# Patient Record
Sex: Female | Born: 1965 | Race: Black or African American | Hispanic: No | Smoking: Never smoker
Health system: Southern US, Community
[De-identification: ages and names within clinical notes are randomized; demographics above are authoritative.]

## PROBLEM LIST (undated history)

## (undated) DIAGNOSIS — F329 Major depressive disorder, single episode, unspecified: Secondary | ICD-10-CM

## (undated) DIAGNOSIS — T300 Burn of unspecified body region, unspecified degree: Secondary | ICD-10-CM

## (undated) DIAGNOSIS — E785 Hyperlipidemia, unspecified: Secondary | ICD-10-CM

## (undated) DIAGNOSIS — F32A Depression, unspecified: Secondary | ICD-10-CM

## (undated) DIAGNOSIS — R002 Palpitations: Secondary | ICD-10-CM

## (undated) DIAGNOSIS — G4733 Obstructive sleep apnea (adult) (pediatric): Secondary | ICD-10-CM

## (undated) DIAGNOSIS — E119 Type 2 diabetes mellitus without complications: Secondary | ICD-10-CM

## (undated) DIAGNOSIS — D509 Iron deficiency anemia, unspecified: Secondary | ICD-10-CM

## (undated) DIAGNOSIS — Z8742 Personal history of other diseases of the female genital tract: Secondary | ICD-10-CM

## (undated) DIAGNOSIS — I10 Essential (primary) hypertension: Secondary | ICD-10-CM

## (undated) DIAGNOSIS — I5032 Chronic diastolic (congestive) heart failure: Secondary | ICD-10-CM

## (undated) DIAGNOSIS — I2510 Atherosclerotic heart disease of native coronary artery without angina pectoris: Secondary | ICD-10-CM

## (undated) HISTORY — DX: Atherosclerotic heart disease of native coronary artery without angina pectoris: I25.10

## (undated) HISTORY — DX: Chronic diastolic (congestive) heart failure: I50.32

## (undated) HISTORY — DX: Major depressive disorder, single episode, unspecified: F32.9

## (undated) HISTORY — DX: Obstructive sleep apnea (adult) (pediatric): G47.33

## (undated) HISTORY — DX: Hyperlipidemia, unspecified: E78.5

## (undated) HISTORY — DX: Personal history of other diseases of the female genital tract: Z87.42

## (undated) HISTORY — DX: Iron deficiency anemia, unspecified: D50.9

## (undated) HISTORY — DX: Palpitations: R00.2

## (undated) HISTORY — PX: TUBAL LIGATION: SHX77

## (undated) HISTORY — DX: Essential (primary) hypertension: I10

## (undated) HISTORY — DX: Type 2 diabetes mellitus without complications: E11.9

## (undated) HISTORY — DX: Morbid (severe) obesity due to excess calories: E66.01

## (undated) HISTORY — DX: Burn of unspecified body region, unspecified degree: T30.0

## (undated) HISTORY — DX: Depression, unspecified: F32.A

---

## 2008-12-21 HISTORY — PX: OTHER SURGICAL HISTORY: SHX169

## 2008-12-30 ENCOUNTER — Ambulatory Visit: Payer: Self-pay | Admitting: Internal Medicine

## 2008-12-30 ENCOUNTER — Inpatient Hospital Stay (HOSPITAL_COMMUNITY): Admission: EM | Admit: 2008-12-30 | Discharge: 2009-01-16 | Payer: Self-pay | Admitting: Emergency Medicine

## 2009-01-01 ENCOUNTER — Ambulatory Visit: Payer: Self-pay | Admitting: Cardiothoracic Surgery

## 2009-01-02 ENCOUNTER — Encounter: Payer: Self-pay | Admitting: Cardiothoracic Surgery

## 2009-01-02 ENCOUNTER — Ambulatory Visit: Payer: Self-pay | Admitting: Vascular Surgery

## 2009-01-25 ENCOUNTER — Encounter: Payer: Self-pay | Admitting: Cardiology

## 2009-01-25 ENCOUNTER — Ambulatory Visit: Payer: Self-pay | Admitting: Cardiothoracic Surgery

## 2009-01-25 ENCOUNTER — Encounter: Admission: RE | Admit: 2009-01-25 | Discharge: 2009-01-25 | Payer: Self-pay | Admitting: Cardiothoracic Surgery

## 2009-02-08 ENCOUNTER — Ambulatory Visit: Payer: Self-pay | Admitting: Cardiothoracic Surgery

## 2009-03-01 ENCOUNTER — Encounter: Admission: RE | Admit: 2009-03-01 | Discharge: 2009-03-01 | Payer: Self-pay | Admitting: Cardiothoracic Surgery

## 2009-03-01 ENCOUNTER — Ambulatory Visit: Payer: Self-pay | Admitting: Cardiothoracic Surgery

## 2009-03-11 ENCOUNTER — Ambulatory Visit: Payer: Self-pay | Admitting: Cardiothoracic Surgery

## 2009-03-21 ENCOUNTER — Ambulatory Visit: Payer: Self-pay | Admitting: Cardiothoracic Surgery

## 2009-04-15 ENCOUNTER — Ambulatory Visit: Payer: Self-pay | Admitting: Cardiology

## 2009-04-15 ENCOUNTER — Inpatient Hospital Stay (HOSPITAL_COMMUNITY)
Admission: EM | Admit: 2009-04-15 | Discharge: 2009-04-22 | Payer: Self-pay | Source: Home / Self Care | Admitting: Emergency Medicine

## 2009-04-17 ENCOUNTER — Ambulatory Visit: Payer: Self-pay | Admitting: Cardiothoracic Surgery

## 2009-04-22 ENCOUNTER — Encounter (INDEPENDENT_AMBULATORY_CARE_PROVIDER_SITE_OTHER): Payer: Self-pay | Admitting: *Deleted

## 2009-05-01 ENCOUNTER — Telehealth: Payer: Self-pay | Admitting: Cardiology

## 2009-05-07 ENCOUNTER — Ambulatory Visit: Payer: Self-pay | Admitting: Cardiology

## 2009-05-07 DIAGNOSIS — E1159 Type 2 diabetes mellitus with other circulatory complications: Secondary | ICD-10-CM | POA: Insufficient documentation

## 2009-05-07 DIAGNOSIS — I10 Essential (primary) hypertension: Secondary | ICD-10-CM

## 2009-05-07 DIAGNOSIS — I5032 Chronic diastolic (congestive) heart failure: Secondary | ICD-10-CM | POA: Insufficient documentation

## 2009-05-07 DIAGNOSIS — I25810 Atherosclerosis of coronary artery bypass graft(s) without angina pectoris: Secondary | ICD-10-CM | POA: Insufficient documentation

## 2009-05-07 DIAGNOSIS — I2510 Atherosclerotic heart disease of native coronary artery without angina pectoris: Secondary | ICD-10-CM | POA: Insufficient documentation

## 2009-05-07 DIAGNOSIS — E1169 Type 2 diabetes mellitus with other specified complication: Secondary | ICD-10-CM | POA: Insufficient documentation

## 2009-05-07 DIAGNOSIS — E785 Hyperlipidemia, unspecified: Secondary | ICD-10-CM

## 2009-05-09 LAB — CONVERTED CEMR LAB
BUN: 10 mg/dL (ref 6–23)
CO2: 32 meq/L (ref 19–32)
Calcium: 9.1 mg/dL (ref 8.4–10.5)
Chloride: 100 meq/L (ref 96–112)
Creatinine, Ser: 0.6 mg/dL (ref 0.4–1.2)
Glucose, Bld: 241 mg/dL — ABNORMAL HIGH (ref 70–99)
Pro B Natriuretic peptide (BNP): 153 pg/mL — ABNORMAL HIGH (ref 0.0–100.0)

## 2009-05-17 ENCOUNTER — Encounter: Payer: Self-pay | Admitting: Cardiology

## 2009-05-29 ENCOUNTER — Telehealth: Payer: Self-pay | Admitting: Cardiology

## 2009-06-14 ENCOUNTER — Telehealth: Payer: Self-pay | Admitting: Cardiology

## 2009-06-17 ENCOUNTER — Encounter: Payer: Self-pay | Admitting: Cardiology

## 2009-07-10 ENCOUNTER — Encounter: Payer: Self-pay | Admitting: Cardiology

## 2009-07-10 ENCOUNTER — Ambulatory Visit: Payer: Self-pay

## 2009-07-10 ENCOUNTER — Ambulatory Visit (HOSPITAL_COMMUNITY): Admission: RE | Admit: 2009-07-10 | Discharge: 2009-07-10 | Payer: Self-pay | Admitting: Cardiology

## 2009-07-10 ENCOUNTER — Ambulatory Visit: Payer: Self-pay | Admitting: Cardiology

## 2009-07-22 ENCOUNTER — Ambulatory Visit: Payer: Self-pay

## 2009-07-22 ENCOUNTER — Ambulatory Visit: Payer: Self-pay | Admitting: Cardiology

## 2009-07-22 DIAGNOSIS — R002 Palpitations: Secondary | ICD-10-CM

## 2009-07-22 LAB — CONVERTED CEMR LAB
CO2: 32 meq/L (ref 19–32)
Chloride: 100 meq/L (ref 96–112)
Creatinine, Ser: 0.6 mg/dL (ref 0.4–1.2)
Magnesium: 1.8 mg/dL (ref 1.5–2.5)

## 2009-07-26 ENCOUNTER — Telehealth: Payer: Self-pay | Admitting: Cardiology

## 2009-09-09 ENCOUNTER — Encounter: Payer: Self-pay | Admitting: Cardiology

## 2009-10-02 ENCOUNTER — Encounter: Payer: Self-pay | Admitting: Cardiology

## 2009-10-28 ENCOUNTER — Ambulatory Visit: Payer: Self-pay | Admitting: Cardiology

## 2010-01-20 ENCOUNTER — Encounter: Payer: Self-pay | Admitting: Cardiology

## 2010-01-24 ENCOUNTER — Encounter: Payer: Self-pay | Admitting: Cardiology

## 2010-01-24 DIAGNOSIS — I259 Chronic ischemic heart disease, unspecified: Secondary | ICD-10-CM | POA: Insufficient documentation

## 2010-02-04 ENCOUNTER — Ambulatory Visit: Payer: Self-pay | Admitting: Cardiology

## 2010-02-04 ENCOUNTER — Encounter: Payer: Self-pay | Admitting: Cardiology

## 2010-02-04 DIAGNOSIS — R079 Chest pain, unspecified: Secondary | ICD-10-CM

## 2010-02-11 ENCOUNTER — Telehealth (INDEPENDENT_AMBULATORY_CARE_PROVIDER_SITE_OTHER): Payer: Self-pay | Admitting: Radiology

## 2010-02-12 ENCOUNTER — Ambulatory Visit: Payer: Self-pay

## 2010-02-12 ENCOUNTER — Ambulatory Visit: Payer: Self-pay | Admitting: Internal Medicine

## 2010-02-12 ENCOUNTER — Encounter: Payer: Self-pay | Admitting: Internal Medicine

## 2010-02-12 ENCOUNTER — Encounter (HOSPITAL_COMMUNITY)
Admission: RE | Admit: 2010-02-12 | Discharge: 2010-04-22 | Payer: Self-pay | Source: Home / Self Care | Attending: Cardiology | Admitting: Cardiology

## 2010-02-12 LAB — CONVERTED CEMR LAB
ALT: 27 units/L (ref 0–35)
AST: 26 units/L (ref 0–37)
Albumin: 3.9 g/dL (ref 3.5–5.2)
HDL: 28.5 mg/dL — ABNORMAL LOW (ref >39.00)
Total CK: 314 units/L — ABNORMAL HIGH (ref 7–177)
Total Protein: 6.9 g/dL (ref 6.0–8.3)
Triglycerides: 177 mg/dL — ABNORMAL HIGH (ref 0.0–149.0)
VLDL: 35.4 mg/dL (ref 0.0–40.0)

## 2010-02-17 ENCOUNTER — Ambulatory Visit: Payer: Self-pay

## 2010-02-24 ENCOUNTER — Encounter: Payer: Self-pay | Admitting: Cardiology

## 2010-02-24 ENCOUNTER — Ambulatory Visit: Payer: Self-pay | Admitting: Cardiology

## 2010-02-25 LAB — CONVERTED CEMR LAB
Basophils Relative: 0.3 % (ref 0.0–3.0)
Calcium: 9.4 mg/dL (ref 8.4–10.5)
Creatinine, Ser: 0.7 mg/dL (ref 0.4–1.2)
Eosinophils Relative: 0.7 % (ref 0.0–5.0)
GFR calc non Af Amer: 118.1 mL/min (ref >60.00)
Glucose, Bld: 278 mg/dL — ABNORMAL HIGH (ref 70–99)
Hemoglobin: 12.5 g/dL (ref 12.0–15.0)
Lymphocytes Relative: 31.3 % (ref 12.0–46.0)
MCHC: 33.5 g/dL (ref 30.0–36.0)
Monocytes Relative: 7.3 % (ref 3.0–12.0)
Neutro Abs: 4.7 10*3/uL (ref 1.4–7.7)
RDW: 14.3 % (ref 11.5–14.6)
Sodium: 140 meq/L (ref 135–145)

## 2010-02-27 ENCOUNTER — Telehealth: Payer: Self-pay | Admitting: Cardiology

## 2010-02-28 ENCOUNTER — Ambulatory Visit
Admission: RE | Admit: 2010-02-28 | Discharge: 2010-02-28 | Payer: Self-pay | Source: Home / Self Care | Attending: Cardiology | Admitting: Cardiology

## 2010-02-28 ENCOUNTER — Inpatient Hospital Stay (HOSPITAL_COMMUNITY)
Admission: AD | Admit: 2010-02-28 | Discharge: 2010-03-06 | Payer: Self-pay | Source: Home / Self Care | Attending: Cardiology | Admitting: Cardiology

## 2010-03-13 ENCOUNTER — Encounter: Payer: Self-pay | Admitting: Cardiology

## 2010-03-20 ENCOUNTER — Encounter: Payer: Self-pay | Admitting: Cardiology

## 2010-03-20 ENCOUNTER — Ambulatory Visit: Payer: Self-pay | Admitting: Cardiology

## 2010-03-26 ENCOUNTER — Encounter: Payer: Self-pay | Admitting: Cardiology

## 2010-03-31 ENCOUNTER — Encounter: Payer: Self-pay | Admitting: Cardiology

## 2010-04-02 ENCOUNTER — Ambulatory Visit: Admit: 2010-04-02 | Payer: Self-pay | Admitting: Cardiology

## 2010-04-11 ENCOUNTER — Encounter: Payer: Self-pay | Admitting: Cardiology

## 2010-04-18 ENCOUNTER — Encounter (INDEPENDENT_AMBULATORY_CARE_PROVIDER_SITE_OTHER): Payer: Self-pay | Admitting: *Deleted
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
## 2010-04-22 NOTE — Letter (Signed)
Summary: Generic Letter  Press photographer, [place]  [address] [place]   [place], [place] [address]   Phone: [phone]  Fax: [phone]        October 28, 2009 MRN: [id number]    [facility] [place], [place]  [address] DOB [birth year]  Fasting lipid and liver profile in 2 months  414.05   428.32 Please fax results to Dr Aundra Dubin Fax [phone]         [name]  This letter has been electronically signed by your physician.

## 2010-04-22 NOTE — Miscellaneous (Signed)
Summary: [facility] Progress Report   Imported By: Sallee Provencal 11/08/2009 10:58:10  _____________________________________________________________________  External Attachment:    Type:   Image     Comment:   External Document

## 2010-04-22 NOTE — Miscellaneous (Signed)
Summary: [facility] Cardiac Session [place] Cardiac Session 11   Imported By: Sallee Provencal 09/17/2009 15:27:24  _____________________________________________________________________  External Attachment:    Type:   Image     Comment:   External Document

## 2010-04-22 NOTE — Letter (Signed)
Summary: Generic Letter  Press photographer, [place]  [address] [place]   [place], [place] [address]   Phone: [phone]  Fax: [phone]        October 28, 2009 MRN: [id number]    [facility] [place], [place]  [address] DOB 1965-12-26   CK   414.05  428.32 In 1 month-please fax results to Dr Aundra Dubin Fax [phone]        Theador Hawthorne  This letter has been electronically signed by your physician.

## 2010-04-22 NOTE — Progress Notes (Signed)
Summary: surgical clearance  Phone Note From Other Clinic   Summary of Call: Per CIndy pt needs clearance for surgery. Procedure for tomorrow.[phone] fax [phone] Initial call taken by: Lorraine Lax,  May 01, 2009 12:33 PM  Follow-up for Phone Call        [facility] in [facility] with [place]- pt scheduled for intrauterine ablation 05-02-09 --discussed with Dr Aundra Dubin and faxed copy of D/C summary from 04-22-09 --Cindy aware pt should remain on effient,aspirin, and coreg for the procedure. Eliot Ford

## 2010-04-22 NOTE — Letter (Signed)
Summary: Cardiac Catheterization Instructions- [place], [place]  [id number] [place]   [place], [place] [address]   Phone: [phone]  Fax: [phone]     02/24/2010 MRN: [id number]  [facility] [address], [place]  [address]  Dear [name],   You are scheduled for a Cardiac Catheterization on Friday February 28, 2010 with Dr. Loralie Champagne.  Please arrive to the 1st floor of the Heart and Vascular Center at [facility] at 9:30 am on the day of your procedure. Please do not arrive before 6:30 a.m. Call the Heart and Vascular Center at [phone] if you are unable to make your appointmnet. The Code to get into the parking garage under the building is 0003. Take the elevators to the 1st floor. You must have someone to drive you home. Someone must be with you for the first 24 hours after you arrive home. Please wear clothes that are easy to get on and off and wear slip-on shoes. Do not eat or drink after midnight except water with your medications that morning. Bring all your medications and current insurance cards with you.  _x__ DO NOT take these medications before your procedure:  DO NOT TAKE GLUCOPHAGE STARTING THURSDAY NIGHT AND FOR 48 HOURS.  ADJUST YOUR INSULIN DOSE BASED ON YOU BLOOD SUGAR.    _x__ Make sure you take your aspirin.  __x_ You may take ALL of your other  medications with water that morning.    The usual length of stay after your procedure is 2 to 3 hours. This can vary.  If you have any questions, please call the office at the number listed above.   Desiree Lucy, RN, BSN
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
## 2010-04-22 NOTE — Miscellaneous (Signed)
Summary: [facility] Discharge Notification   [facility] Discharge Notification   Imported By: Sallee Provencal 02/05/2010 11:20:29  _____________________________________________________________________  External Attachment:    Type:   Image     Comment:   External Document
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
## 2010-04-22 NOTE — Progress Notes (Signed)
Summary: question on meds  Phone Note Call from Patient Call back at [place]: Patient Reason for Call: Talk to Nurse Summary of Call: pt has question on meds Initial call taken by: Regan Lemming,  February 27, 2010 4:25 PM  Follow-up for Phone Call        pt. wondering if she should take her insulin tonight since she has a cath in the morning. I explained to her that she should check her blood sugar tonight and dose her insulin accordingly. They should check her CBG on arrival to the [facility] lab as well. She is going to take her glucophage tonight around 6-7pm but will not take anymore oral diabetic medications after this. She is aware not to eat/drink after midnight.  Whitney Jannett Celestine RN  February 27, 2010 5:06 PM  Follow-up by: Whitney Jannett Celestine RN,  February 27, 2010 5:06 PM

## 2010-04-22 NOTE — Procedures (Signed)
Summary: summary report  summary report   Imported By: Parks Neptune 07/31/2009 11:31:33  _____________________________________________________________________  External Attachment:    Type:   Image     Comment:   External Document

## 2010-04-22 NOTE — Progress Notes (Signed)
Summary: nuc pre-procedure  Phone Note Outgoing Call   Call placed by: Charlton Amor, CNMT,  February 11, 2010 2:14 PM Call placed to: Patient Reason for Call: Confirm/change Appt Summary of Call: Reviewed information on Myoview Information Sheet (see scanned document for further details).  Spoke with patient.  Initial call taken by: Charlton Amor, CNMT,  February 11, 2010 2:14 PM     Nuclear Med Background Indications for Stress Test: Evaluation for Ischemia, Graft Patency   History: CABG, Echo, Heart Catheterization, Myocardial Infarction, Stents  History Comments: 10/10 CABG x 3, 1/11 MI / Cath /stents - svg>pda, svg>lad; mild osa; Chronic diastolic HF  Symptoms: Chest Pain, Chest Tightness, DOE, Palpitations    Nuclear Pre-Procedure Cardiac Risk Factors: Hypertension, IDDM Type 2, Lipids, Obesity Height (in): 67

## 2010-04-22 NOTE — Assessment & Plan Note (Signed)
Summary: Cardiology Nuclear Testing  Nuclear Med Background Indications for Stress Test: Evaluation for Ischemia, Graft Patency   History: CABG, Echo, Heart Catheterization, Myocardial Infarction, Stents  History Comments: 10/10 CABG x 3, 1/11 MI>Stent-SVG>PDA, SVG>LAD  Symptoms: Chest Pain, Chest Pain with Exertion, Chest Tightness, Chest Tightness with Exertion, DOE, Fatigue, Nausea, Palpitations  Symptoms Comments: Last episode of [id number] night.   Nuclear Pre-Procedure Cardiac Risk Factors: Family History - CAD, Hypertension, IDDM Type 2, Lipids, Obesity Caffeine/Decaff Intake: none NPO After: 7:30 AM Lungs: Clear.  O2 Sat 97% on RA. IV 0.9% NS with Angio Cath: 22g     IV Site: R Hand IV Started by: Annye Rusk, CNMT Chest Size (in) 48     Cup Size C     Height (in): 67 Weight (lb): 329 BMI: 51.71 Tech Comments: Carvedilol held this a.m.  Nuclear Med Study 1 or 2 day study:  2 day     Stress Test Type:  Carlton Adam Reading MD:  Dorris Carnes, MD     Referring MD:  Loralie Champagne, MD Resting Radionuclide:  Technetium 63m Tetrofosmin     Resting Radionuclide Dose:  33.0 mCi  Stress Radionuclide:  Technetium 89m Tetrofosmin     Stress Radionuclide Dose:  33.0 mCi   Stress Protocol   Lexiscan: 0.4 mg   Stress Test Technologist:  Valetta Fuller, CMA-N     Nuclear Technologist:  Charlton Amor, CNMT  Rest Procedure  Myocardial perfusion imaging was performed at rest 45 minutes following the intravenous administration of Technetium 15m Tetrofosmin.  Stress Procedure  The patient received IV Lexiscan 0.4 mg over 15-seconds.  Technetium 27m Tetrofosmin injected at 30-seconds.  There were no significant changes with infusion, other than a rare PVC.  Quantitative spect images were obtained after a 45 minute delay.  QPS Raw Data Images:  Soft tissue (diaphragm, bowel activity, breast tissue) surround heart. Stress Images:  Defect in the inferor wall (base, mid); inferolateral wall  (base, mid).  Note extensive soft tissue scatter. Rest Images:  Improvement, normalization in the inferior/inferolateral wall. Transient Ischemic Dilatation:  .70  (Normal <1.22)  Lung/Heart Ratio:  .32  (Normal <0.45)  Quantitative Gated Spect Images QGS EDV:  87 ml QGS ESV:  36 ml QGS EF:  58 %   Overall Impression  Exercise Capacity: Lexiscan with no exercise. BP Response: Normal blood pressure response. Clinical Symptoms: No chest pain ECG Impression: No significant ST segment change suggestive of ischemia. Overall Impression Comments: Ischemia in the inferior, inferolateral wall (base, mid).  Appended Document: Cardiology Nuclear Testing inferior/inferolateral ischemia.  If symptoms are stable, will need to see her back in office to discuss cath versus medical management.   Appended Document: Cardiology Nuclear Testing 02/18/10--1130am--unable to reach pt by phone--will try pt later today--nt  Appended Document: Cardiology Nuclear Testing 02/18/10--1630pm--still unable to reach pt by phone--nt  Appended Document: Cardiology Nuclear Testing would try to reach her again Wednesday.  Appended Document: Cardiology Nuclear Testing [facility]  Appended Document: Cardiology Nuclear Testing I talked with pt by telephone--she states he symptoms are mostly unchanged, although she does have some days that her energy level is worse --she states she had chest pain last night that was sharp and did not last very long--she did not have to take NTG for relief--she has not had to use NTG since her OV with Dr Aundra Dubin 02/04/10--I offered pt an appt with Dr Aundra Dubin 02/20/10 -she states her husband has an appt in [place] tomorrow--she wants to think about what  she wants to do  and call me back today  Appended Document: Cardiology Nuclear Testing [facility]   Appended Document: Cardiology Nuclear Testing I talked with pt-she is aware of appt with Dr Aundra Dubin 02/24/10 at 12:30--

## 2010-04-22 NOTE — Consult Note (Signed)
Summary: [place] Referral Form  [facility] Rehab Referral Form   Imported By: Sallee Provencal 06/26/2009 16:16:22  _____________________________________________________________________  External Attachment:    Type:   Image     Comment:   External Document

## 2010-04-22 NOTE — Progress Notes (Signed)
Summary: monitor results 07/22/09-07/25/09  Phone Note Outgoing Call   Call placed by: Desiree Lucy, RN, BSN,  Jul 26, 2009 8:46 AM Call placed to: Patient  Follow-up for Phone Call        I am trying to  contact pt by telephone to discuss monitor results 07/22/09-07/25/09(on Dr Claris Gladden cart)--LMVM Desiree Lucy, RN, BSN  Jul 26, 2009 8:48 AM unable to leave message on voice mail Desiree Lucy, RN, BSN  Jul 26, 2009 12:20 PM  LM for pt to call Desiree Lucy, RN, BSN  Jul 26, 2009 3:04 PM  Pt returned call Delsa Sale  Jul 26, 2009 3:49 PM  Additional Follow-up for Phone Call Additional follow up Details #1::        returning call, please call back [phone] discussed monitor results with pt by telephone

## 2010-04-22 NOTE — Letter (Signed)
Summary: Appointment - [place], [place]  [address] [place]   [place], [place] [address]   Phone: [phone]  Fax: [phone]     April 22, 2009 MRN: [id number]   [facility] [address] [place], [place]  [address]   Dear [name],   Due to a change in our office schedule, your appointment on 05/02/2009 at 8:30am                has been rescheduled to 05/07/2009 @10 :30am. We look forward to participating in your health care needs. Please contact us at the number listed above at your earliest convenience to reschedule this appointment if you are not available then.     Sincerely,  Public relations account executive

## 2010-04-22 NOTE — Progress Notes (Signed)
Summary: appt/ lab work  Phone Note Call from Patient Call back at [phone]   Reason for Call: Talk to Nurse, Lab or Test Results Summary of Call: has appt tomorrow, husband possible having surgery may need to cancel, would like results of lab work Initial call taken by: Darnell Level,  May 29, 2009 10:56 AM  Follow-up for Phone Call        Called patient back with results of bmet done at the end of Feb. in [place]...advised her to stay on the same dose of KCL. She will call back if she needs to reschedule appointment with Dr.Loyalty Brashier for tomorrow. Follow-up by: Georgetta Haber RN

## 2010-04-22 NOTE — Progress Notes (Signed)
Summary: start cardiac rehab  Phone Note Call from Patient Call back at Home Phone [phone] Call back at [phone]   Caller: Patient Reason for Call: Talk to Nurse Summary of Call: pt wants to start cardiac rehab in [place], pt doesn't have the phone #.  Initial call taken by: Neil Crouch,  June 14, 2009 3:28 PM  Follow-up for Phone Call        Left message for pt to call back.  I left on the message that our office needs further information on where this pt would like to attend cardiac rehab before starting referral. Theodosia Quay, RN, BSN  June 14, 2009 3:56 PM     Appended Document: start cardiac rehab Needs to start rehab at the hospital in [facility].  Please help her set this up.   Appended Document: start cardiac rehab faxed order for cardiac rehab to [facility] [phone]-pt aware  Appended Document: start cardiac rehab [place] fax  [phone]

## 2010-04-24 LAB — CONVERTED CEMR LAB
BUN: 16 mg/dL (ref 6–23)
CO2: 28 meq/L (ref 19–32)
Calcium: 9.1 mg/dL (ref 8.4–10.5)
Creatinine, Ser: 0.6 mg/dL (ref 0.4–1.2)
Glucose, Bld: 190 mg/dL — ABNORMAL HIGH (ref 70–99)

## 2010-04-24 NOTE — Letter (Signed)
Summary: Appointment - [place], [place]  [address] [place]   [place], [place] [address]   Phone: [phone]  Fax: [phone]          April 18, 2010 MRN: [id number]      [facility] [place], [place]  [address]     Dear Ms. Hoffmaster,   Please disregard previous letter. Your appointment on June 05, 2010 at 10:30am with Dr Aundra Dubin is not canceled. It is very important we reach you to confirm this appointment.   We look forward to participating in your health care needs. Please contact us at the number listed above at your earliest convenience to reschedule this appointment.     Sincerely,  [place] Scheduling Team

## 2010-04-24 NOTE — Miscellaneous (Signed)
Clinical Lists Changes  Observations: Added new observation of CARDCATHFIND: IMPRESSION: 1. Severe three-vessel coronary artery disease. 2. Occluded saphenous vein graft to the right coronary artery with     subtotal occlusion of the native right coronary artery, now status     post placement of three drug-eluting stents. 3. Severe stenosis in the saphenous vein graft to the mid left     anterior descending, now status post drug-eluting stent x1 in the     proximal body of the saphenous vein graft.   RECOMMENDATIONS:  I recommend aggressive risk factor modification as well as continuation of dual-antiplatelet therapy with aspirin and Effient for at least 1 year.  I would recommend that this patient stay on Effient for the remainder of her lifetime if she tolerates it.  We will also continue her beta-blocker, ACE inhibitor, Imdur, and her statin.       (03/05/2010 13:44) Added new observation of ECHOINTERP: - Left ventricle: The cavity size was normal. Wall thickness was     increased in a pattern of moderate to severe LVH. Systolic     function was normal. The estimated ejection fraction was 55%.     Features are consistent with a pseudonormal left ventricular     filling pattern, with concomitant abnormal relaxation and     increased filling pressure (grade 2 diastolic dysfunction).   - Ventricular septum: Septal motion showed paradox. These changes     are consistent with a post-thoracotomy state.   - Mitral valve: Calcified annulus.   - Left atrium: The atrium was moderately dilated.   - Impressions: Technically limited study due to poor sound wave     transmission.   Impressions:    - Technically limited study due to poor sound wave transmission.  (03/01/2010 13:45) Added new observation of NUCLEAR NOS: Exercise Capacity: Lexiscan with no exercise. BP Response: Normal blood pressure response. Clinical Symptoms: No chest pain ECG Impression: No significant ST segment  change suggestive of ischemia. Overall Impression Comments: Ischemia in the inferior, inferolateral wall (base, mid).  (02/12/2010 13:44)      Cardiac Cath  Procedure date:  03/05/2010  Findings:      IMPRESSION: 1. Severe three-vessel coronary artery disease. 2. Occluded saphenous vein graft to the right coronary artery with     subtotal occlusion of the native right coronary artery, now status     post placement of three drug-eluting stents. 3. Severe stenosis in the saphenous vein graft to the mid left     anterior descending, now status post drug-eluting stent x1 in the     proximal body of the saphenous vein graft.   RECOMMENDATIONS:  I recommend aggressive risk factor modification as well as continuation of dual-antiplatelet therapy with aspirin and Effient for at least 1 year.  I would recommend that this patient stay on Effient for the remainder of her lifetime if she tolerates it.  We will also continue her beta-blocker, ACE inhibitor, Imdur, and her statin.        Nuclear Study  Procedure date:  02/12/2010  Findings:      Exercise Capacity: Lexiscan with no exercise. BP Response: Normal blood pressure response. Clinical Symptoms: No chest pain ECG Impression: No significant ST segment change suggestive of ischemia. Overall Impression Comments: Ischemia in the inferior, inferolateral wall (base, mid).   Echocardiogram  Procedure date:  03/01/2010  Findings:      - Left ventricle: The cavity size was normal. Wall thickness was  increased in a pattern of moderate to severe LVH. Systolic     function was normal. The estimated ejection fraction was 55%.     Features are consistent with a pseudonormal left ventricular     filling pattern, with concomitant abnormal relaxation and     increased filling pressure (grade 2 diastolic dysfunction).   - Ventricular septum: Septal motion showed paradox. These changes     are consistent with a post-thoracotomy  state.   - Mitral valve: Calcified annulus.   - Left atrium: The atrium was moderately dilated.   - Impressions: Technically limited study due to poor sound wave     transmission.   Impressions:    - Technically limited study due to poor sound wave transmission.
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
## 2010-04-30 NOTE — Consult Note (Signed)
Summary: [facility] Cardiac Rehab Referral Form   [facility] Cardiac Rehab Referral Form   Imported By: Sallee Provencal 04/22/2010 15:16:37  _____________________________________________________________________  External Attachment:    Type:   Image     Comment:   External Document

## 2010-05-09 ENCOUNTER — Telehealth: Payer: Self-pay | Admitting: Cardiology

## 2010-05-14 NOTE — Progress Notes (Signed)
Summary: sample of crestor  Phone Note Call from Patient Call back at Home Phone [phone]   Caller: Patient Reason for Call: Talk to Nurse Summary of Call: per pt calling, sample of 5 mg crestor.  Initial call taken by: Neil Crouch,  May 09, 2010 11:37 AM  Follow-up for Phone Call        I talked with pt--I left samples of Crestor 5mg  #28 at desk for pt to pick up--pt states she had cholesterol checked in January in [place] and her LDL was good--she thinks 62!!!-she will ask them to fax a copy to Dr Aundra Dubin

## 2010-05-16 NOTE — H&P (Signed)
  [name], [name]              ACCOUNT NO.:  [id number]  MEDICAL RECORD NO.:  [id number]          PATIENT TYPE:  INP  LOCATION:  2008                         FACILITY:  [facility]  PHYSICIAN:  Loralie Champagne, MD      DATE OF BIRTH:  08-20-1965  DATE OF ADMISSION:  02/28/2010 DATE OF DISCHARGE:  03/06/2010                             HISTORY & PHYSICAL   This patient was admitted to the Outpatient Cath Lab for a cardiac catheterization.  There has been no change in her history and physical since the prior visit.     Loralie Champagne, MD     DM/MEDQ  D:  05/01/2010  T:  05/01/2010  Job:  [id number]  Electronically Signed by Loralie Champagne MD on 05/16/2010 10:33:45 AM

## 2010-05-19 ENCOUNTER — Encounter: Payer: Self-pay | Admitting: Cardiology

## 2010-05-29 ENCOUNTER — Encounter (INDEPENDENT_AMBULATORY_CARE_PROVIDER_SITE_OTHER): Payer: Self-pay | Admitting: *Deleted

## 2010-05-29 NOTE — Letter (Signed)
Summary: [facility]: Physician Referral [facility]: Physician Referral Form   Imported By: Roddie Mc 05/19/2010 17:37:45  _____________________________________________________________________  External Attachment:    Type:   Image     Comment:   External Document

## 2010-05-29 NOTE — Miscellaneous (Signed)
Summary: [facility] Assessment/Treatment Plan   Imported By: Sallee Provencal 05/14/2010 11:39:55  _____________________________________________________________________  External Attachment:    Type:   Image     Comment:   External Document

## 2010-05-29 NOTE — Miscellaneous (Signed)
Summary: Cardiac Rehab Program  Cardiac Rehab Program   Imported By: Roddie Mc 05/16/2010 09:13:11  _____________________________________________________________________  External Attachment:    Type:   Image     Comment:   External Document

## 2010-05-29 NOTE — Letter (Signed)
Summary: [facility]: Fax [facility]: Fax Cover Sheet   Imported By: Roddie Mc 05/22/2010 15:50:01  _____________________________________________________________________  External Attachment:    Type:   Image     Comment:   External Document

## 2010-06-02 LAB — CBC
HCT: 36.5 % (ref 36.0–46.0)
MCH: 26.3 pg (ref 26.0–34.0)
MCHC: 32.9 g/dL (ref 30.0–36.0)
MCV: 79.9 fL (ref 78.0–100.0)
WBC: 6 10*3/uL (ref 4.0–10.5)

## 2010-06-02 LAB — BASIC METABOLIC PANEL
BUN: 8 mg/dL (ref 6–23)
CO2: 29 mEq/L (ref 19–32)
Calcium: 9 mg/dL (ref 8.4–10.5)
Chloride: 99 mEq/L (ref 96–112)
GFR calc non Af Amer: >60 mL/min (ref >60)
Potassium: 3.5 mEq/L (ref 3.5–5.1)
Sodium: 135 mEq/L (ref 135–145)

## 2010-06-03 LAB — BASIC METABOLIC PANEL
CO2: 27 mEq/L (ref 19–32)
CO2: 30 mEq/L (ref 19–32)
Calcium: 8.6 mg/dL (ref 8.4–10.5)
Calcium: 9 mg/dL (ref 8.4–10.5)
Calcium: 9.4 mg/dL (ref 8.4–10.5)
Chloride: 101 mEq/L (ref 96–112)
Creatinine, Ser: 0.62 mg/dL (ref 0.4–1.2)
GFR calc Af Amer: >60 mL/min (ref >60)
GFR calc Af Amer: >60 mL/min (ref >60)
GFR calc Af Amer: >60 mL/min (ref >60)
GFR calc Af Amer: >60 mL/min (ref >60)
GFR calc non Af Amer: >60 mL/min (ref >60)
GFR calc non Af Amer: >60 mL/min (ref >60)
GFR calc non Af Amer: >60 mL/min (ref >60)
Glucose, Bld: 216 mg/dL — ABNORMAL HIGH (ref 70–99)
Glucose, Bld: 231 mg/dL — ABNORMAL HIGH (ref 70–99)
Glucose, Bld: 241 mg/dL — ABNORMAL HIGH (ref 70–99)
Potassium: 3.6 mEq/L (ref 3.5–5.1)
Sodium: 137 mEq/L (ref 135–145)
Sodium: 139 mEq/L (ref 135–145)
Sodium: 140 mEq/L (ref 135–145)

## 2010-06-03 LAB — CBC
HCT: 33.8 % — ABNORMAL LOW (ref 36.0–46.0)
HCT: 35.6 % — ABNORMAL LOW (ref 36.0–46.0)
HCT: 36 % (ref 36.0–46.0)
Hemoglobin: 11 g/dL — ABNORMAL LOW (ref 12.0–15.0)
Hemoglobin: 11.8 g/dL — ABNORMAL LOW (ref 12.0–15.0)
MCH: 26.4 pg (ref 26.0–34.0)
MCH: 26.5 pg (ref 26.0–34.0)
MCHC: 31.9 g/dL (ref 30.0–36.0)
MCHC: 32.5 g/dL (ref 30.0–36.0)
MCHC: 33.1 g/dL (ref 30.0–36.0)
MCHC: 33.2 g/dL (ref 30.0–36.0)
MCV: 79.6 fL (ref 78.0–100.0)
Platelets: 221 10*3/uL (ref 150–400)
RBC: 4.45 MIL/uL (ref 3.87–5.11)
RBC: 4.47 MIL/uL (ref 3.87–5.11)
RDW: 13.4 % (ref 11.5–15.5)
RDW: 13.6 % (ref 11.5–15.5)
WBC: 6.6 10*3/uL (ref 4.0–10.5)

## 2010-06-03 LAB — GLUCOSE, CAPILLARY
Glucose-Capillary: 216 mg/dL — ABNORMAL HIGH (ref 70–99)
Glucose-Capillary: 220 mg/dL — ABNORMAL HIGH (ref 70–99)
Glucose-Capillary: 246 mg/dL — ABNORMAL HIGH (ref 70–99)
Glucose-Capillary: 246 mg/dL — ABNORMAL HIGH (ref 70–99)
Glucose-Capillary: 247 mg/dL — ABNORMAL HIGH (ref 70–99)
Glucose-Capillary: 250 mg/dL — ABNORMAL HIGH (ref 70–99)
Glucose-Capillary: 259 mg/dL — ABNORMAL HIGH (ref 70–99)
Glucose-Capillary: 267 mg/dL — ABNORMAL HIGH (ref 70–99)
Glucose-Capillary: 280 mg/dL — ABNORMAL HIGH (ref 70–99)
Glucose-Capillary: 284 mg/dL — ABNORMAL HIGH (ref 70–99)
Glucose-Capillary: 287 mg/dL — ABNORMAL HIGH (ref 70–99)
Glucose-Capillary: 292 mg/dL — ABNORMAL HIGH (ref 70–99)

## 2010-06-03 LAB — COMPREHENSIVE METABOLIC PANEL
ALT: 26 U/L (ref 0–35)
AST: 24 U/L (ref 0–37)
CO2: 30 mEq/L (ref 19–32)
Calcium: 8.8 mg/dL (ref 8.4–10.5)
Chloride: 100 mEq/L (ref 96–112)
Creatinine, Ser: 0.64 mg/dL (ref 0.4–1.2)
GFR calc Af Amer: >60 mL/min (ref >60)
GFR calc non Af Amer: >60 mL/min (ref >60)
Glucose, Bld: 298 mg/dL — ABNORMAL HIGH (ref 70–99)
Sodium: 135 mEq/L (ref 135–145)
Total Bilirubin: 0.5 mg/dL (ref 0.3–1.2)

## 2010-06-03 LAB — PLATELET COUNT: Platelets: 212 10*3/uL (ref 150–400)

## 2010-06-03 LAB — LIPID PANEL
Cholesterol: 132 mg/dL (ref 0–200)
HDL: 30 mg/dL — ABNORMAL LOW (ref >39)
Total CHOL/HDL Ratio: 4.4 RATIO
VLDL: 37 mg/dL (ref 0–40)

## 2010-06-03 LAB — PLATELET INHIBITION P2Y12
P2Y12 % Inhibition: 51 %
Platelet Function  P2Y12: 168 [PRU] — ABNORMAL LOW (ref 194–418)

## 2010-06-03 LAB — TSH: TSH: 0.887 u[IU]/mL (ref 0.350–4.500)

## 2010-06-03 LAB — PROTIME-INR: Prothrombin Time: 13.7 seconds (ref 11.6–15.2)

## 2010-06-03 NOTE — Letter (Signed)
Summary: Appointment - [place] [place]  [address] [place]   [place], [place] [address]   Phone: [phone]  Fax: [phone]         May 29, 2010 MRN: [id number]      [facility] [place], [place]  [address]     Dear Ms. Vorhees,   Due to a change in our office schedule, your appointment on June 05, 2010 at 10:30am with Dr. Aundra Dubin must be changed.  It is very important that we reach you to reschedule this appointment. We look forward to participating in your health care needs. Please contact us at the number listed above at your earliest convenience to reschedule this appointment.     Sincerely,  [facility] Scheduling Team

## 2010-06-05 ENCOUNTER — Ambulatory Visit: Payer: Self-pay | Admitting: Cardiology

## 2010-06-06 NOTE — H&P (Signed)
  NAMESHALANE, GREEN              ACCOUNT NO.:  [id number]  MEDICAL RECORD NO.:  [id number]           PATIENT TYPE:  LOCATION:                                 FACILITY:  PHYSICIAN:  Loralie Champagne, MD      DATE OF BIRTH:  1965/03/28  DATE OF ADMISSION:  02/28/2010 DATE OF DISCHARGE:                             HISTORY & PHYSICAL   The patient was admitted for cardiac catheterization.  History and physical as the same as previously dictated office note.     Loralie Champagne, MD     DM/MEDQ  D:  05/29/2010  T:  05/29/2010  Job:  [id number]  Electronically Signed by Loralie Champagne MD on 06/06/2010 09:32:10 PM

## 2010-06-08 LAB — CBC
HCT: 24.8 % — ABNORMAL LOW (ref 36.0–46.0)
HCT: 25.3 % — ABNORMAL LOW (ref 36.0–46.0)
HCT: 26.1 % — ABNORMAL LOW (ref 36.0–46.0)
HCT: 26.3 % — ABNORMAL LOW (ref 36.0–46.0)
HCT: 26.7 % — ABNORMAL LOW (ref 36.0–46.0)
HCT: 27.8 % — ABNORMAL LOW (ref 36.0–46.0)
Hemoglobin: 7.6 g/dL — ABNORMAL LOW (ref 12.0–15.0)
Hemoglobin: 7.6 g/dL — ABNORMAL LOW (ref 12.0–15.0)
Hemoglobin: 7.8 g/dL — ABNORMAL LOW (ref 12.0–15.0)
Hemoglobin: 8 g/dL — ABNORMAL LOW (ref 12.0–15.0)
Hemoglobin: 8.1 g/dL — ABNORMAL LOW (ref 12.0–15.0)
Hemoglobin: 8.2 g/dL — ABNORMAL LOW (ref 12.0–15.0)
Hemoglobin: 8.2 g/dL — ABNORMAL LOW (ref 12.0–15.0)
Hemoglobin: 8.3 g/dL — ABNORMAL LOW (ref 12.0–15.0)
MCHC: 30.3 g/dL (ref 30.0–36.0)
MCHC: 30.4 g/dL (ref 30.0–36.0)
MCHC: 31.3 g/dL (ref 30.0–36.0)
MCV: 59.5 fL — ABNORMAL LOW (ref 78.0–100.0)
MCV: 59.5 fL — ABNORMAL LOW (ref 78.0–100.0)
MCV: 59.5 fL — ABNORMAL LOW (ref 78.0–100.0)
MCV: 60.6 fL — ABNORMAL LOW (ref 78.0–100.0)
MCV: 61.2 fL — ABNORMAL LOW (ref 78.0–100.0)
MCV: 62.3 fL — ABNORMAL LOW (ref 78.0–100.0)
Platelets: 241 10*3/uL (ref 150–400)
Platelets: 246 10*3/uL (ref 150–400)
Platelets: 248 10*3/uL (ref 150–400)
Platelets: 255 10*3/uL (ref 150–400)
Platelets: 289 10*3/uL (ref 150–400)
RBC: 4.13 MIL/uL (ref 3.87–5.11)
RBC: 4.17 MIL/uL (ref 3.87–5.11)
RBC: 4.32 MIL/uL (ref 3.87–5.11)
RBC: 4.36 MIL/uL (ref 3.87–5.11)
RBC: 4.36 MIL/uL (ref 3.87–5.11)
RBC: 4.39 MIL/uL (ref 3.87–5.11)
RDW: 24.5 % — ABNORMAL HIGH (ref 11.5–15.5)
RDW: 24.8 % — ABNORMAL HIGH (ref 11.5–15.5)
RDW: 25.9 % — ABNORMAL HIGH (ref 11.5–15.5)
RDW: 27 % — ABNORMAL HIGH (ref 11.5–15.5)
WBC: 10.5 10*3/uL (ref 4.0–10.5)
WBC: 5.9 10*3/uL (ref 4.0–10.5)
WBC: 6.7 10*3/uL (ref 4.0–10.5)
WBC: 8.5 10*3/uL (ref 4.0–10.5)
WBC: 9.3 10*3/uL (ref 4.0–10.5)
WBC: 9.4 10*3/uL (ref 4.0–10.5)

## 2010-06-08 LAB — GLUCOSE, CAPILLARY
Glucose-Capillary: 141 mg/dL — ABNORMAL HIGH (ref 70–99)
Glucose-Capillary: 148 mg/dL — ABNORMAL HIGH (ref 70–99)
Glucose-Capillary: 154 mg/dL — ABNORMAL HIGH (ref 70–99)
Glucose-Capillary: 175 mg/dL — ABNORMAL HIGH (ref 70–99)
Glucose-Capillary: 200 mg/dL — ABNORMAL HIGH (ref 70–99)
Glucose-Capillary: 229 mg/dL — ABNORMAL HIGH (ref 70–99)
Glucose-Capillary: 236 mg/dL — ABNORMAL HIGH (ref 70–99)
Glucose-Capillary: 254 mg/dL — ABNORMAL HIGH (ref 70–99)
Glucose-Capillary: 278 mg/dL — ABNORMAL HIGH (ref 70–99)
Glucose-Capillary: 286 mg/dL — ABNORMAL HIGH (ref 70–99)
Glucose-Capillary: 310 mg/dL — ABNORMAL HIGH (ref 70–99)
Glucose-Capillary: 311 mg/dL — ABNORMAL HIGH (ref 70–99)

## 2010-06-08 LAB — BASIC METABOLIC PANEL
BUN: 6 mg/dL (ref 6–23)
BUN: 7 mg/dL (ref 6–23)
BUN: 7 mg/dL (ref 6–23)
BUN: 8 mg/dL (ref 6–23)
CO2: 28 mEq/L (ref 19–32)
CO2: 29 mEq/L (ref 19–32)
CO2: 29 mEq/L (ref 19–32)
CO2: 30 mEq/L (ref 19–32)
CO2: 30 mEq/L (ref 19–32)
Calcium: 8.8 mg/dL (ref 8.4–10.5)
Calcium: 8.9 mg/dL (ref 8.4–10.5)
Calcium: 9 mg/dL (ref 8.4–10.5)
Chloride: 100 mEq/L (ref 96–112)
Chloride: 103 mEq/L (ref 96–112)
Chloride: 97 mEq/L (ref 96–112)
Chloride: 98 mEq/L (ref 96–112)
Chloride: 99 mEq/L (ref 96–112)
Creatinine, Ser: 0.56 mg/dL (ref 0.4–1.2)
Creatinine, Ser: 0.6 mg/dL (ref 0.4–1.2)
Creatinine, Ser: 0.62 mg/dL (ref 0.4–1.2)
GFR calc Af Amer: >60 mL/min (ref >60)
GFR calc Af Amer: >60 mL/min (ref >60)
GFR calc Af Amer: >60 mL/min (ref >60)
GFR calc Af Amer: >60 mL/min (ref >60)
GFR calc Af Amer: >60 mL/min (ref >60)
GFR calc Af Amer: >60 mL/min (ref >60)
GFR calc non Af Amer: >60 mL/min (ref >60)
GFR calc non Af Amer: >60 mL/min (ref >60)
GFR calc non Af Amer: >60 mL/min (ref >60)
GFR calc non Af Amer: >60 mL/min (ref >60)
Glucose, Bld: 162 mg/dL — ABNORMAL HIGH (ref 70–99)
Glucose, Bld: 189 mg/dL — ABNORMAL HIGH (ref 70–99)
Glucose, Bld: 285 mg/dL — ABNORMAL HIGH (ref 70–99)
Potassium: 3.2 mEq/L — ABNORMAL LOW (ref 3.5–5.1)
Potassium: 3.3 mEq/L — ABNORMAL LOW (ref 3.5–5.1)
Potassium: 3.7 mEq/L (ref 3.5–5.1)
Potassium: 3.8 mEq/L (ref 3.5–5.1)
Potassium: 3.8 mEq/L (ref 3.5–5.1)
Potassium: 4 mEq/L (ref 3.5–5.1)
Sodium: 135 mEq/L (ref 135–145)
Sodium: 135 mEq/L (ref 135–145)
Sodium: 135 mEq/L (ref 135–145)
Sodium: 136 mEq/L (ref 135–145)
Sodium: 137 mEq/L (ref 135–145)
Sodium: 138 mEq/L (ref 135–145)
Sodium: 140 mEq/L (ref 135–145)

## 2010-06-08 LAB — CROSSMATCH
ABO/RH(D): A POS
Antibody Screen: POSITIVE
DAT, IgG: NEGATIVE
Donor AG Type: NEGATIVE

## 2010-06-08 LAB — POCT I-STAT, CHEM 8
BUN: 7 mg/dL (ref 6–23)
Chloride: 102 mEq/L (ref 96–112)
Creatinine, Ser: 0.6 mg/dL (ref 0.4–1.2)
Glucose, Bld: 197 mg/dL — ABNORMAL HIGH (ref 70–99)
Potassium: 3.7 mEq/L (ref 3.5–5.1)
Sodium: 140 mEq/L (ref 135–145)

## 2010-06-08 LAB — DIFFERENTIAL
Basophils Absolute: 0.1 10*3/uL (ref 0.0–0.1)
Basophils Relative: 0 % (ref 0–1)
Eosinophils Relative: 1 % (ref 0–5)
Eosinophils Relative: 1 % (ref 0–5)
Lymphocytes Relative: 18 % (ref 12–46)
Lymphocytes Relative: 26 % (ref 12–46)
Lymphs Abs: 2.4 10*3/uL (ref 0.7–4.0)
Monocytes Relative: 10 % (ref 3–12)
Neutro Abs: 7.7 10*3/uL (ref 1.7–7.7)
Neutrophils Relative %: 62 % (ref 43–77)
Neutrophils Relative %: 73 % (ref 43–77)

## 2010-06-08 LAB — CK TOTAL AND CKMB (NOT AT ARMC)
CK, MB: 3.1 ng/mL (ref 0.3–4.0)
Relative Index: 1.5 (ref 0.0–2.5)
Relative Index: 1.7 (ref 0.0–2.5)
Total CK: 178 U/L — ABNORMAL HIGH (ref 7–177)

## 2010-06-08 LAB — POCT CARDIAC MARKERS
CKMB, poc: 1.8 ng/mL (ref 1.0–8.0)
Myoglobin, poc: 57.3 ng/mL (ref 12–200)
Troponin i, poc: 0.09 ng/mL (ref 0.00–0.09)

## 2010-06-08 LAB — PLATELET INHIBITION P2Y12
P2Y12 % Inhibition: 13 %
Platelet Function Baseline: 396 [PRU] (ref 194–418)

## 2010-06-08 LAB — PROTIME-INR
INR: 1.13 (ref 0.00–1.49)
INR: 1.18 (ref 0.00–1.49)
Prothrombin Time: 14.4 seconds (ref 11.6–15.2)

## 2010-06-08 LAB — MAGNESIUM: Magnesium: 1.6 mg/dL (ref 1.5–2.5)

## 2010-06-08 LAB — TSH: TSH: 1.568 u[IU]/mL (ref 0.350–4.500)

## 2010-06-08 LAB — TROPONIN I: Troponin I: 0.24 ng/mL — ABNORMAL HIGH (ref 0.00–0.06)

## 2010-06-26 LAB — HEMOGLOBIN AND HEMATOCRIT, BLOOD: HCT: 26.8 % — ABNORMAL LOW (ref 36.0–46.0)

## 2010-06-26 LAB — CBC
HCT: 24.9 % — ABNORMAL LOW (ref 36.0–46.0)
HCT: 25.5 % — ABNORMAL LOW (ref 36.0–46.0)
HCT: 25.6 % — ABNORMAL LOW (ref 36.0–46.0)
HCT: 25.6 % — ABNORMAL LOW (ref 36.0–46.0)
HCT: 27.7 % — ABNORMAL LOW (ref 36.0–46.0)
HCT: 29.1 % — ABNORMAL LOW (ref 36.0–46.0)
HCT: 29.5 % — ABNORMAL LOW (ref 36.0–46.0)
HCT: 29.6 % — ABNORMAL LOW (ref 36.0–46.0)
HCT: 29.6 % — ABNORMAL LOW (ref 36.0–46.0)
HCT: 30.6 % — ABNORMAL LOW (ref 36.0–46.0)
HCT: 33.9 % — ABNORMAL LOW (ref 36.0–46.0)
HCT: 34.5 % — ABNORMAL LOW (ref 36.0–46.0)
HCT: 35.1 % — ABNORMAL LOW (ref 36.0–46.0)
HCT: 33.3 % — ABNORMAL LOW (ref 36.0–46.0)
HCT: 33.5 % — ABNORMAL LOW (ref 36.0–46.0)
HCT: 34 % — ABNORMAL LOW (ref 36.0–46.0)
HCT: 34 % — ABNORMAL LOW (ref 36.0–46.0)
Hemoglobin: 10 g/dL — ABNORMAL LOW (ref 12.0–15.0)
Hemoglobin: 10 g/dL — ABNORMAL LOW (ref 12.0–15.0)
Hemoglobin: 10.3 g/dL — ABNORMAL LOW (ref 12.0–15.0)
Hemoglobin: 10.9 g/dL — ABNORMAL LOW (ref 12.0–15.0)
Hemoglobin: 11.6 g/dL — ABNORMAL LOW (ref 12.0–15.0)
Hemoglobin: 11.9 g/dL — ABNORMAL LOW (ref 12.0–15.0)
Hemoglobin: 8.3 g/dL — ABNORMAL LOW (ref 12.0–15.0)
Hemoglobin: 8.6 g/dL — ABNORMAL LOW (ref 12.0–15.0)
Hemoglobin: 8.6 g/dL — ABNORMAL LOW (ref 12.0–15.0)
Hemoglobin: 8.6 g/dL — ABNORMAL LOW (ref 12.0–15.0)
Hemoglobin: 9.4 g/dL — ABNORMAL LOW (ref 12.0–15.0)
Hemoglobin: 9.9 g/dL — ABNORMAL LOW (ref 12.0–15.0)
Hemoglobin: 9.9 g/dL — ABNORMAL LOW (ref 12.0–15.0)
Hemoglobin: 11.5 g/dL — ABNORMAL LOW (ref 12.0–15.0)
Hemoglobin: 12.7 g/dL (ref 12.0–15.0)
MCHC: 33.1 g/dL (ref 30.0–36.0)
MCHC: 33.4 g/dL (ref 30.0–36.0)
MCHC: 33.5 g/dL (ref 30.0–36.0)
MCHC: 33.6 g/dL (ref 30.0–36.0)
MCHC: 33.7 g/dL (ref 30.0–36.0)
MCHC: 33.7 g/dL (ref 30.0–36.0)
MCHC: 33.9 g/dL (ref 30.0–36.0)
MCHC: 33.9 g/dL (ref 30.0–36.0)
MCHC: 34 g/dL (ref 30.0–36.0)
MCHC: 34 g/dL (ref 30.0–36.0)
MCHC: 33.7 g/dL (ref 30.0–36.0)
MCHC: 34.3 g/dL (ref 30.0–36.0)
MCV: 82.6 fL (ref 78.0–100.0)
MCV: 83 fL (ref 78.0–100.0)
MCV: 83.3 fL (ref 78.0–100.0)
MCV: 83.3 fL (ref 78.0–100.0)
MCV: 83.4 fL (ref 78.0–100.0)
MCV: 83.6 fL (ref 78.0–100.0)
MCV: 83.7 fL (ref 78.0–100.0)
MCV: 83.8 fL (ref 78.0–100.0)
MCV: 83.8 fL (ref 78.0–100.0)
MCV: 83.8 fL (ref 78.0–100.0)
MCV: 83.8 fL (ref 78.0–100.0)
MCV: 84 fL (ref 78.0–100.0)
MCV: 84.1 fL (ref 78.0–100.0)
MCV: 82.9 fL (ref 78.0–100.0)
MCV: 83.2 fL (ref 78.0–100.0)
MCV: 83.8 fL (ref 78.0–100.0)
MCV: 84.1 fL (ref 78.0–100.0)
Platelets: 205 10*3/uL (ref 150–400)
Platelets: 206 10*3/uL (ref 150–400)
Platelets: 208 10*3/uL (ref 150–400)
Platelets: 224 10*3/uL (ref 150–400)
Platelets: 229 10*3/uL (ref 150–400)
Platelets: 249 10*3/uL (ref 150–400)
Platelets: 254 10*3/uL (ref 150–400)
Platelets: 255 10*3/uL (ref 150–400)
Platelets: 272 10*3/uL (ref 150–400)
Platelets: 312 10*3/uL (ref 150–400)
Platelets: 247 10*3/uL (ref 150–400)
Platelets: 250 10*3/uL (ref 150–400)
Platelets: 251 10*3/uL (ref 150–400)
RBC: 2.97 MIL/uL — ABNORMAL LOW (ref 3.87–5.11)
RBC: 3.04 MIL/uL — ABNORMAL LOW (ref 3.87–5.11)
RBC: 3.06 MIL/uL — ABNORMAL LOW (ref 3.87–5.11)
RBC: 3.3 MIL/uL — ABNORMAL LOW (ref 3.87–5.11)
RBC: 3.5 MIL/uL — ABNORMAL LOW (ref 3.87–5.11)
RBC: 3.51 MIL/uL — ABNORMAL LOW (ref 3.87–5.11)
RBC: 3.54 MIL/uL — ABNORMAL LOW (ref 3.87–5.11)
RBC: 3.54 MIL/uL — ABNORMAL LOW (ref 3.87–5.11)
RBC: 3.65 MIL/uL — ABNORMAL LOW (ref 3.87–5.11)
RBC: 3.87 MIL/uL (ref 3.87–5.11)
RBC: 4.06 MIL/uL (ref 3.87–5.11)
RBC: 4.18 MIL/uL (ref 3.87–5.11)
RBC: 4.04 MIL/uL (ref 3.87–5.11)
RBC: 4.05 MIL/uL (ref 3.87–5.11)
RBC: 4.08 MIL/uL (ref 3.87–5.11)
RDW: 13.8 % (ref 11.5–15.5)
RDW: 13.9 % (ref 11.5–15.5)
RDW: 13.9 % (ref 11.5–15.5)
RDW: 14 % (ref 11.5–15.5)
RDW: 14.1 % (ref 11.5–15.5)
RDW: 14.1 % (ref 11.5–15.5)
RDW: 14.3 % (ref 11.5–15.5)
RDW: 14.4 % (ref 11.5–15.5)
RDW: 14.6 % (ref 11.5–15.5)
RDW: 13.6 % (ref 11.5–15.5)
RDW: 14.1 % (ref 11.5–15.5)
RDW: 14.2 % (ref 11.5–15.5)
RDW: 14.4 % (ref 11.5–15.5)
WBC: 11.8 10*3/uL — ABNORMAL HIGH (ref 4.0–10.5)
WBC: 12.7 10*3/uL — ABNORMAL HIGH (ref 4.0–10.5)
WBC: 14.6 10*3/uL — ABNORMAL HIGH (ref 4.0–10.5)
WBC: 16 10*3/uL — ABNORMAL HIGH (ref 4.0–10.5)
WBC: 16.6 10*3/uL — ABNORMAL HIGH (ref 4.0–10.5)
WBC: 18 10*3/uL — ABNORMAL HIGH (ref 4.0–10.5)
WBC: 18.1 10*3/uL — ABNORMAL HIGH (ref 4.0–10.5)
WBC: 18.1 10*3/uL — ABNORMAL HIGH (ref 4.0–10.5)
WBC: 19 10*3/uL — ABNORMAL HIGH (ref 4.0–10.5)
WBC: 24.5 10*3/uL — ABNORMAL HIGH (ref 4.0–10.5)
WBC: 8.3 10*3/uL (ref 4.0–10.5)
WBC: 8.5 10*3/uL (ref 4.0–10.5)
WBC: 9.1 10*3/uL (ref 4.0–10.5)
WBC: 8.1 10*3/uL (ref 4.0–10.5)
WBC: 8.5 10*3/uL (ref 4.0–10.5)
WBC: 8.6 10*3/uL (ref 4.0–10.5)
WBC: 8.8 10*3/uL (ref 4.0–10.5)
WBC: 8.9 10*3/uL (ref 4.0–10.5)

## 2010-06-26 LAB — GLUCOSE, CAPILLARY
Glucose-Capillary: 119 mg/dL — ABNORMAL HIGH (ref 70–99)
Glucose-Capillary: 156 mg/dL — ABNORMAL HIGH (ref 70–99)
Glucose-Capillary: 163 mg/dL — ABNORMAL HIGH (ref 70–99)
Glucose-Capillary: 169 mg/dL — ABNORMAL HIGH (ref 70–99)
Glucose-Capillary: 177 mg/dL — ABNORMAL HIGH (ref 70–99)
Glucose-Capillary: 178 mg/dL — ABNORMAL HIGH (ref 70–99)
Glucose-Capillary: 182 mg/dL — ABNORMAL HIGH (ref 70–99)
Glucose-Capillary: 206 mg/dL — ABNORMAL HIGH (ref 70–99)
Glucose-Capillary: 207 mg/dL — ABNORMAL HIGH (ref 70–99)
Glucose-Capillary: 208 mg/dL — ABNORMAL HIGH (ref 70–99)
Glucose-Capillary: 209 mg/dL — ABNORMAL HIGH (ref 70–99)
Glucose-Capillary: 214 mg/dL — ABNORMAL HIGH (ref 70–99)
Glucose-Capillary: 230 mg/dL — ABNORMAL HIGH (ref 70–99)
Glucose-Capillary: 239 mg/dL — ABNORMAL HIGH (ref 70–99)
Glucose-Capillary: 252 mg/dL — ABNORMAL HIGH (ref 70–99)
Glucose-Capillary: 269 mg/dL — ABNORMAL HIGH (ref 70–99)
Glucose-Capillary: 280 mg/dL — ABNORMAL HIGH (ref 70–99)
Glucose-Capillary: 330 mg/dL — ABNORMAL HIGH (ref 70–99)
Glucose-Capillary: 342 mg/dL — ABNORMAL HIGH (ref 70–99)
Glucose-Capillary: 388 mg/dL — ABNORMAL HIGH (ref 70–99)
Glucose-Capillary: 67 mg/dL — ABNORMAL LOW (ref 70–99)
Glucose-Capillary: 72 mg/dL (ref 70–99)
Glucose-Capillary: 83 mg/dL (ref 70–99)
Glucose-Capillary: 139 mg/dL — ABNORMAL HIGH (ref 70–99)
Glucose-Capillary: 145 mg/dL — ABNORMAL HIGH (ref 70–99)
Glucose-Capillary: 158 mg/dL — ABNORMAL HIGH (ref 70–99)
Glucose-Capillary: 197 mg/dL — ABNORMAL HIGH (ref 70–99)
Glucose-Capillary: 200 mg/dL — ABNORMAL HIGH (ref 70–99)
Glucose-Capillary: 231 mg/dL — ABNORMAL HIGH (ref 70–99)
Glucose-Capillary: 247 mg/dL — ABNORMAL HIGH (ref 70–99)
Glucose-Capillary: 259 mg/dL — ABNORMAL HIGH (ref 70–99)
Glucose-Capillary: 264 mg/dL — ABNORMAL HIGH (ref 70–99)
Glucose-Capillary: 281 mg/dL — ABNORMAL HIGH (ref 70–99)
Glucose-Capillary: 310 mg/dL — ABNORMAL HIGH (ref 70–99)
Glucose-Capillary: 343 mg/dL — ABNORMAL HIGH (ref 70–99)

## 2010-06-26 LAB — HEPARIN LEVEL (UNFRACTIONATED)
Heparin Unfractionated: 0.17 IU/mL — ABNORMAL LOW (ref 0.30–0.70)
Heparin Unfractionated: 0.26 IU/mL — ABNORMAL LOW (ref 0.30–0.70)
Heparin Unfractionated: 0.13 IU/mL — ABNORMAL LOW (ref 0.30–0.70)
Heparin Unfractionated: 0.17 IU/mL — ABNORMAL LOW (ref 0.30–0.70)
Heparin Unfractionated: 0.37 IU/mL (ref 0.30–0.70)
Heparin Unfractionated: 0.51 IU/mL (ref 0.30–0.70)
Heparin Unfractionated: 0.64 IU/mL (ref 0.30–0.70)
Heparin Unfractionated: 0.66 IU/mL (ref 0.30–0.70)
Heparin Unfractionated: 0.71 IU/mL — ABNORMAL HIGH (ref 0.30–0.70)

## 2010-06-26 LAB — POCT I-STAT 4, (NA,K, GLUC, HGB,HCT)
Glucose, Bld: 150 mg/dL — ABNORMAL HIGH (ref 70–99)
Glucose, Bld: 164 mg/dL — ABNORMAL HIGH (ref 70–99)
Glucose, Bld: 177 mg/dL — ABNORMAL HIGH (ref 70–99)
Glucose, Bld: 214 mg/dL — ABNORMAL HIGH (ref 70–99)
HCT: 32 % — ABNORMAL LOW (ref 36.0–46.0)
HCT: 35 % — ABNORMAL LOW (ref 36.0–46.0)
Hemoglobin: 10.9 g/dL — ABNORMAL LOW (ref 12.0–15.0)
Hemoglobin: 11.2 g/dL — ABNORMAL LOW (ref 12.0–15.0)
Hemoglobin: 11.9 g/dL — ABNORMAL LOW (ref 12.0–15.0)
Hemoglobin: 8.5 g/dL — ABNORMAL LOW (ref 12.0–15.0)
Potassium: 3.4 mEq/L — ABNORMAL LOW (ref 3.5–5.1)
Potassium: 3.5 mEq/L (ref 3.5–5.1)
Potassium: 3.6 mEq/L (ref 3.5–5.1)
Potassium: 4.2 mEq/L (ref 3.5–5.1)
Potassium: 4.3 mEq/L (ref 3.5–5.1)
Sodium: 136 mEq/L (ref 135–145)
Sodium: 139 mEq/L (ref 135–145)

## 2010-06-26 LAB — PLATELET COUNT: Platelets: 203 10*3/uL (ref 150–400)

## 2010-06-26 LAB — LIPID PANEL
Cholesterol: 189 mg/dL (ref 0–200)
LDL Cholesterol: 125 mg/dL — ABNORMAL HIGH (ref 0–99)
Total CHOL/HDL Ratio: 5.3 RATIO
Triglycerides: 118 mg/dL (ref <150)
VLDL: 24 mg/dL (ref 0–40)

## 2010-06-26 LAB — BASIC METABOLIC PANEL
BUN: 12 mg/dL (ref 6–23)
BUN: 12 mg/dL (ref 6–23)
BUN: 12 mg/dL (ref 6–23)
BUN: 5 mg/dL — ABNORMAL LOW (ref 6–23)
BUN: 5 mg/dL — ABNORMAL LOW (ref 6–23)
BUN: 8 mg/dL (ref 6–23)
BUN: 8 mg/dL (ref 6–23)
BUN: 9 mg/dL (ref 6–23)
BUN: 9 mg/dL (ref 6–23)
BUN: 5 mg/dL — ABNORMAL LOW (ref 6–23)
BUN: 6 mg/dL (ref 6–23)
CO2: 24 mEq/L (ref 19–32)
CO2: 26 mEq/L (ref 19–32)
CO2: 28 mEq/L (ref 19–32)
CO2: 28 mEq/L (ref 19–32)
CO2: 28 mEq/L (ref 19–32)
CO2: 28 mEq/L (ref 19–32)
CO2: 31 mEq/L (ref 19–32)
CO2: 26 mEq/L (ref 19–32)
Calcium: 7.8 mg/dL — ABNORMAL LOW (ref 8.4–10.5)
Calcium: 7.8 mg/dL — ABNORMAL LOW (ref 8.4–10.5)
Calcium: 8 mg/dL — ABNORMAL LOW (ref 8.4–10.5)
Calcium: 8.3 mg/dL — ABNORMAL LOW (ref 8.4–10.5)
Calcium: 8.3 mg/dL — ABNORMAL LOW (ref 8.4–10.5)
Calcium: 8.6 mg/dL (ref 8.4–10.5)
Calcium: 8.7 mg/dL (ref 8.4–10.5)
Calcium: 9 mg/dL (ref 8.4–10.5)
Calcium: 9.4 mg/dL (ref 8.4–10.5)
Chloride: 100 mEq/L (ref 96–112)
Chloride: 101 mEq/L (ref 96–112)
Chloride: 101 mEq/L (ref 96–112)
Chloride: 102 mEq/L (ref 96–112)
Chloride: 105 mEq/L (ref 96–112)
Chloride: 95 mEq/L — ABNORMAL LOW (ref 96–112)
Chloride: 95 mEq/L — ABNORMAL LOW (ref 96–112)
Chloride: 95 mEq/L — ABNORMAL LOW (ref 96–112)
Chloride: 97 mEq/L (ref 96–112)
Chloride: 99 mEq/L (ref 96–112)
Chloride: 101 mEq/L (ref 96–112)
Chloride: 101 mEq/L (ref 96–112)
Chloride: 102 mEq/L (ref 96–112)
Creatinine, Ser: 0.59 mg/dL (ref 0.4–1.2)
Creatinine, Ser: 0.62 mg/dL (ref 0.4–1.2)
Creatinine, Ser: 0.62 mg/dL (ref 0.4–1.2)
Creatinine, Ser: 0.62 mg/dL (ref 0.4–1.2)
Creatinine, Ser: 0.64 mg/dL (ref 0.4–1.2)
Creatinine, Ser: 0.67 mg/dL (ref 0.4–1.2)
Creatinine, Ser: 0.67 mg/dL (ref 0.4–1.2)
Creatinine, Ser: 0.68 mg/dL (ref 0.4–1.2)
Creatinine, Ser: 0.58 mg/dL (ref 0.4–1.2)
Creatinine, Ser: 0.65 mg/dL (ref 0.4–1.2)
Creatinine, Ser: 0.67 mg/dL (ref 0.4–1.2)
GFR calc Af Amer: >60 mL/min (ref >60)
GFR calc Af Amer: >60 mL/min (ref >60)
GFR calc Af Amer: >60 mL/min (ref >60)
GFR calc Af Amer: >60 mL/min (ref >60)
GFR calc Af Amer: >60 mL/min (ref >60)
GFR calc Af Amer: >60 mL/min (ref >60)
GFR calc Af Amer: >60 mL/min (ref >60)
GFR calc Af Amer: >60 mL/min (ref >60)
GFR calc Af Amer: >60 mL/min (ref >60)
GFR calc Af Amer: >60 mL/min (ref >60)
GFR calc Af Amer: >60 mL/min (ref >60)
GFR calc Af Amer: >60 mL/min (ref >60)
GFR calc non Af Amer: >60 mL/min (ref >60)
GFR calc non Af Amer: >60 mL/min (ref >60)
GFR calc non Af Amer: >60 mL/min (ref >60)
GFR calc non Af Amer: >60 mL/min (ref >60)
GFR calc non Af Amer: >60 mL/min (ref >60)
GFR calc non Af Amer: >60 mL/min (ref >60)
GFR calc non Af Amer: >60 mL/min (ref >60)
GFR calc non Af Amer: >60 mL/min (ref >60)
GFR calc non Af Amer: >60 mL/min (ref >60)
GFR calc non Af Amer: >60 mL/min (ref >60)
GFR calc non Af Amer: >60 mL/min (ref >60)
GFR calc non Af Amer: >60 mL/min (ref >60)
Glucose, Bld: 113 mg/dL — ABNORMAL HIGH (ref 70–99)
Glucose, Bld: 139 mg/dL — ABNORMAL HIGH (ref 70–99)
Glucose, Bld: 166 mg/dL — ABNORMAL HIGH (ref 70–99)
Glucose, Bld: 181 mg/dL — ABNORMAL HIGH (ref 70–99)
Glucose, Bld: 197 mg/dL — ABNORMAL HIGH (ref 70–99)
Glucose, Bld: 211 mg/dL — ABNORMAL HIGH (ref 70–99)
Glucose, Bld: 339 mg/dL — ABNORMAL HIGH (ref 70–99)
Glucose, Bld: 61 mg/dL — ABNORMAL LOW (ref 70–99)
Glucose, Bld: 214 mg/dL — ABNORMAL HIGH (ref 70–99)
Glucose, Bld: 327 mg/dL — ABNORMAL HIGH (ref 70–99)
Potassium: 3.6 mEq/L (ref 3.5–5.1)
Potassium: 3.6 mEq/L (ref 3.5–5.1)
Potassium: 3.6 mEq/L (ref 3.5–5.1)
Potassium: 3.7 mEq/L (ref 3.5–5.1)
Potassium: 3.7 mEq/L (ref 3.5–5.1)
Potassium: 3.7 mEq/L (ref 3.5–5.1)
Potassium: 3.7 mEq/L (ref 3.5–5.1)
Potassium: 3.8 mEq/L (ref 3.5–5.1)
Potassium: 3.8 mEq/L (ref 3.5–5.1)
Potassium: 3.9 mEq/L (ref 3.5–5.1)
Potassium: 3.9 mEq/L (ref 3.5–5.1)
Potassium: 3.7 mEq/L (ref 3.5–5.1)
Potassium: 3.7 mEq/L (ref 3.5–5.1)
Potassium: 3.7 mEq/L (ref 3.5–5.1)
Potassium: 4.3 mEq/L (ref 3.5–5.1)
Sodium: 128 mEq/L — ABNORMAL LOW (ref 135–145)
Sodium: 131 mEq/L — ABNORMAL LOW (ref 135–145)
Sodium: 131 mEq/L — ABNORMAL LOW (ref 135–145)
Sodium: 132 mEq/L — ABNORMAL LOW (ref 135–145)
Sodium: 133 mEq/L — ABNORMAL LOW (ref 135–145)
Sodium: 136 mEq/L (ref 135–145)
Sodium: 136 mEq/L (ref 135–145)
Sodium: 136 mEq/L (ref 135–145)
Sodium: 136 mEq/L (ref 135–145)
Sodium: 137 mEq/L (ref 135–145)
Sodium: 139 mEq/L (ref 135–145)

## 2010-06-26 LAB — COMPREHENSIVE METABOLIC PANEL
ALT: 67 U/L — ABNORMAL HIGH (ref 0–35)
AST: 42 U/L — ABNORMAL HIGH (ref 0–37)
Albumin: 3.5 g/dL (ref 3.5–5.2)
Alkaline Phosphatase: 50 U/L (ref 39–117)
BUN: 9 mg/dL (ref 6–23)
CO2: 29 mEq/L (ref 19–32)
Calcium: 8.8 mg/dL (ref 8.4–10.5)
Chloride: 100 mEq/L (ref 96–112)
Creatinine, Ser: 0.69 mg/dL (ref 0.4–1.2)
GFR calc Af Amer: >60 mL/min (ref >60)
GFR calc non Af Amer: >60 mL/min (ref >60)
Glucose, Bld: 241 mg/dL — ABNORMAL HIGH (ref 70–99)
Potassium: 3.7 mEq/L (ref 3.5–5.1)
Sodium: 138 mEq/L (ref 135–145)
Total Bilirubin: 0.3 mg/dL (ref 0.3–1.2)
Total Protein: 6.9 g/dL (ref 6.0–8.3)

## 2010-06-26 LAB — BLOOD GAS, ARTERIAL
Acid-Base Excess: 2.8 mmol/L — ABNORMAL HIGH (ref 0.0–2.0)
Acid-Base Excess: 2.9 mmol/L — ABNORMAL HIGH (ref 0.0–2.0)
Bicarbonate: 27.1 mEq/L — ABNORMAL HIGH (ref 20.0–24.0)
Bicarbonate: 27.1 mEq/L — ABNORMAL HIGH (ref 20.0–24.0)
FIO2: 0.21 %
FIO2: 0.21 %
O2 Saturation: 97.1 %
O2 Saturation: 97.4 %
Patient temperature: 98.6
Patient temperature: 98.6
TCO2: 28.4 mmol/L (ref 0–100)
TCO2: 28.4 mmol/L (ref 0–100)
pCO2 arterial: 43.7 mmHg (ref 35.0–45.0)
pCO2 arterial: 43 mmHg (ref 35.0–45.0)
pH, Arterial: 7.41 — ABNORMAL HIGH (ref 7.350–7.400)
pH, Arterial: 7.416 — ABNORMAL HIGH (ref 7.350–7.400)
pO2, Arterial: 89.5 mmHg (ref 80.0–100.0)
pO2, Arterial: 82.9 mmHg (ref 80.0–100.0)

## 2010-06-26 LAB — BRAIN NATRIURETIC PEPTIDE: Pro B Natriuretic peptide (BNP): <30 pg/mL (ref 0.0–100.0)

## 2010-06-26 LAB — CREATININE, SERUM
Creatinine, Ser: 0.56 mg/dL (ref 0.4–1.2)
Creatinine, Ser: 0.73 mg/dL (ref 0.4–1.2)
GFR calc Af Amer: >60 mL/min (ref >60)
GFR calc Af Amer: >60 mL/min (ref >60)
GFR calc non Af Amer: >60 mL/min (ref >60)
GFR calc non Af Amer: >60 mL/min (ref >60)

## 2010-06-26 LAB — POCT I-STAT 3, ART BLOOD GAS (G3+)
Acid-Base Excess: 2 mmol/L (ref 0.0–2.0)
Acid-Base Excess: 3 mmol/L — ABNORMAL HIGH (ref 0.0–2.0)
Acid-base deficit: 1 mmol/L (ref 0.0–2.0)
Bicarbonate: 23.5 mEq/L (ref 20.0–24.0)
Bicarbonate: 26.3 mEq/L — ABNORMAL HIGH (ref 20.0–24.0)
Bicarbonate: 26.5 mEq/L — ABNORMAL HIGH (ref 20.0–24.0)
Bicarbonate: 26.7 mEq/L — ABNORMAL HIGH (ref 20.0–24.0)
O2 Saturation: 100 %
O2 Saturation: 96 %
O2 Saturation: 99 %
O2 Saturation: 99 %
Patient temperature: 37.4
Patient temperature: 37.9
Patient temperature: 38.1
TCO2: 29 mmol/L (ref 0–100)
pCO2 arterial: 49.9 mmHg — ABNORMAL HIGH (ref 35.0–45.0)
pH, Arterial: 7.333 — ABNORMAL LOW (ref 7.350–7.400)
pH, Arterial: 7.402 — ABNORMAL HIGH (ref 7.350–7.400)
pO2, Arterial: 125 mmHg — ABNORMAL HIGH (ref 80.0–100.0)
pO2, Arterial: 156 mmHg — ABNORMAL HIGH (ref 80.0–100.0)
pO2, Arterial: 73 mmHg — ABNORMAL LOW (ref 80.0–100.0)
pO2, Arterial: 88 mmHg (ref 80.0–100.0)

## 2010-06-26 LAB — CULTURE, BLOOD (ROUTINE X 2)
Culture: NO GROWTH
Culture: NO GROWTH

## 2010-06-26 LAB — URINE CULTURE
Colony Count: 5000
Colony Count: NO GROWTH

## 2010-06-26 LAB — URINALYSIS, ROUTINE W REFLEX MICROSCOPIC
Nitrite: NEGATIVE
Specific Gravity, Urine: 1.007 (ref 1.005–1.030)
Specific Gravity, Urine: 1.02 (ref 1.005–1.030)
Urobilinogen, UA: 1 mg/dL (ref 0.0–1.0)
Urobilinogen, UA: 1 mg/dL (ref 0.0–1.0)
pH: 5.5 (ref 5.0–8.0)
pH: 6 (ref 5.0–8.0)

## 2010-06-26 LAB — CROSSMATCH
ABO/RH(D): A POS
ABO/RH(D): A POS
Antibody Screen: POSITIVE
Antibody Screen: POSITIVE
DAT, IgG: NEGATIVE
Donor AG Type: NEGATIVE
Donor AG Type: NEGATIVE
Donor AG Type: NEGATIVE
Donor AG Type: NEGATIVE
PT AG Type: NEGATIVE

## 2010-06-26 LAB — URINE MICROSCOPIC-ADD ON

## 2010-06-26 LAB — DIFFERENTIAL
Basophils Absolute: 0 10*3/uL (ref 0.0–0.1)
Eosinophils Relative: 1 % (ref 0–5)
Lymphocytes Relative: 25 % (ref 12–46)
Monocytes Absolute: 0.7 10*3/uL (ref 0.1–1.0)

## 2010-06-26 LAB — POCT I-STAT, CHEM 8
BUN: 5 mg/dL — ABNORMAL LOW (ref 6–23)
Calcium, Ion: 1.05 mmol/L — ABNORMAL LOW (ref 1.12–1.32)
Calcium, Ion: 1.06 mmol/L — ABNORMAL LOW (ref 1.12–1.32)
Calcium, Ion: 1.11 mmol/L — ABNORMAL LOW (ref 1.12–1.32)
Chloride: 102 mEq/L (ref 96–112)
Creatinine, Ser: 0.7 mg/dL (ref 0.4–1.2)
Creatinine, Ser: 0.8 mg/dL (ref 0.4–1.2)
Glucose, Bld: 165 mg/dL — ABNORMAL HIGH (ref 70–99)
Glucose, Bld: 350 mg/dL — ABNORMAL HIGH (ref 70–99)
HCT: 28 % — ABNORMAL LOW (ref 36.0–46.0)
HCT: 30 % — ABNORMAL LOW (ref 36.0–46.0)
Hemoglobin: 10.2 g/dL — ABNORMAL LOW (ref 12.0–15.0)
Hemoglobin: 9.5 g/dL — ABNORMAL LOW (ref 12.0–15.0)
Potassium: 4.1 mEq/L (ref 3.5–5.1)

## 2010-06-26 LAB — PROTIME-INR
INR: 1.17 (ref 0.00–1.49)
INR: 1.32 (ref 0.00–1.49)
INR: 1.35 (ref 0.00–1.49)
INR: 1.37 (ref 0.00–1.49)
INR: 1.43 (ref 0.00–1.49)
INR: 0.98 (ref 0.00–1.49)
INR: 1.07 (ref 0.00–1.49)
Prothrombin Time: 14.8 seconds (ref 11.6–15.2)
Prothrombin Time: 16.3 seconds — ABNORMAL HIGH (ref 11.6–15.2)
Prothrombin Time: 16.6 seconds — ABNORMAL HIGH (ref 11.6–15.2)
Prothrombin Time: 16.8 seconds — ABNORMAL HIGH (ref 11.6–15.2)
Prothrombin Time: 17.3 seconds — ABNORMAL HIGH (ref 11.6–15.2)
Prothrombin Time: 12.9 seconds (ref 11.6–15.2)
Prothrombin Time: 13.8 seconds (ref 11.6–15.2)

## 2010-06-26 LAB — CK TOTAL AND CKMB (NOT AT ARMC)
CK, MB: 2.4 ng/mL (ref 0.3–4.0)
CK, MB: 3.3 ng/mL (ref 0.3–4.0)
Total CK: 198 U/L — ABNORMAL HIGH (ref 7–177)

## 2010-06-26 LAB — CARDIAC PANEL(CRET KIN+CKTOT+MB+TROPI)
CK, MB: 3.4 ng/mL (ref 0.3–4.0)
Relative Index: 1.6 (ref 0.0–2.5)

## 2010-06-26 LAB — MAGNESIUM
Magnesium: 1.9 mg/dL (ref 1.5–2.5)
Magnesium: 2 mg/dL (ref 1.5–2.5)
Magnesium: 2.3 mg/dL (ref 1.5–2.5)
Magnesium: 1.7 mg/dL (ref 1.5–2.5)

## 2010-06-26 LAB — APTT
aPTT: 32 seconds (ref 24–37)
aPTT: 127 seconds — ABNORMAL HIGH (ref 24–37)
aPTT: 78 seconds — ABNORMAL HIGH (ref 24–37)

## 2010-06-26 LAB — POCT CARDIAC MARKERS
CKMB, poc: 2.2 ng/mL (ref 1.0–8.0)
Troponin i, poc: <0.05 ng/mL (ref 0.00–0.09)

## 2010-06-26 LAB — PREPARE PLATELETS

## 2010-06-26 LAB — PREPARE FRESH FROZEN PLASMA

## 2010-06-26 LAB — TSH: TSH: 0.878 u[IU]/mL (ref 0.350–4.500)

## 2010-06-26 LAB — POCT I-STAT GLUCOSE
Operator id: 156951
Operator id: 3406

## 2010-06-26 LAB — HEMOGLOBIN A1C: Mean Plasma Glucose: 249 mg/dL

## 2010-06-26 LAB — MRSA PCR SCREENING: MRSA by PCR: NEGATIVE

## 2010-07-02 IMAGING — CR DG CHEST 1V PORT
1 series · 1 of 1 positions shown · non-contrast
Comparison: 01/08/2009

CLINICAL DATA: Post CABG

PORTABLE CHEST - 1 VIEW

[AP]
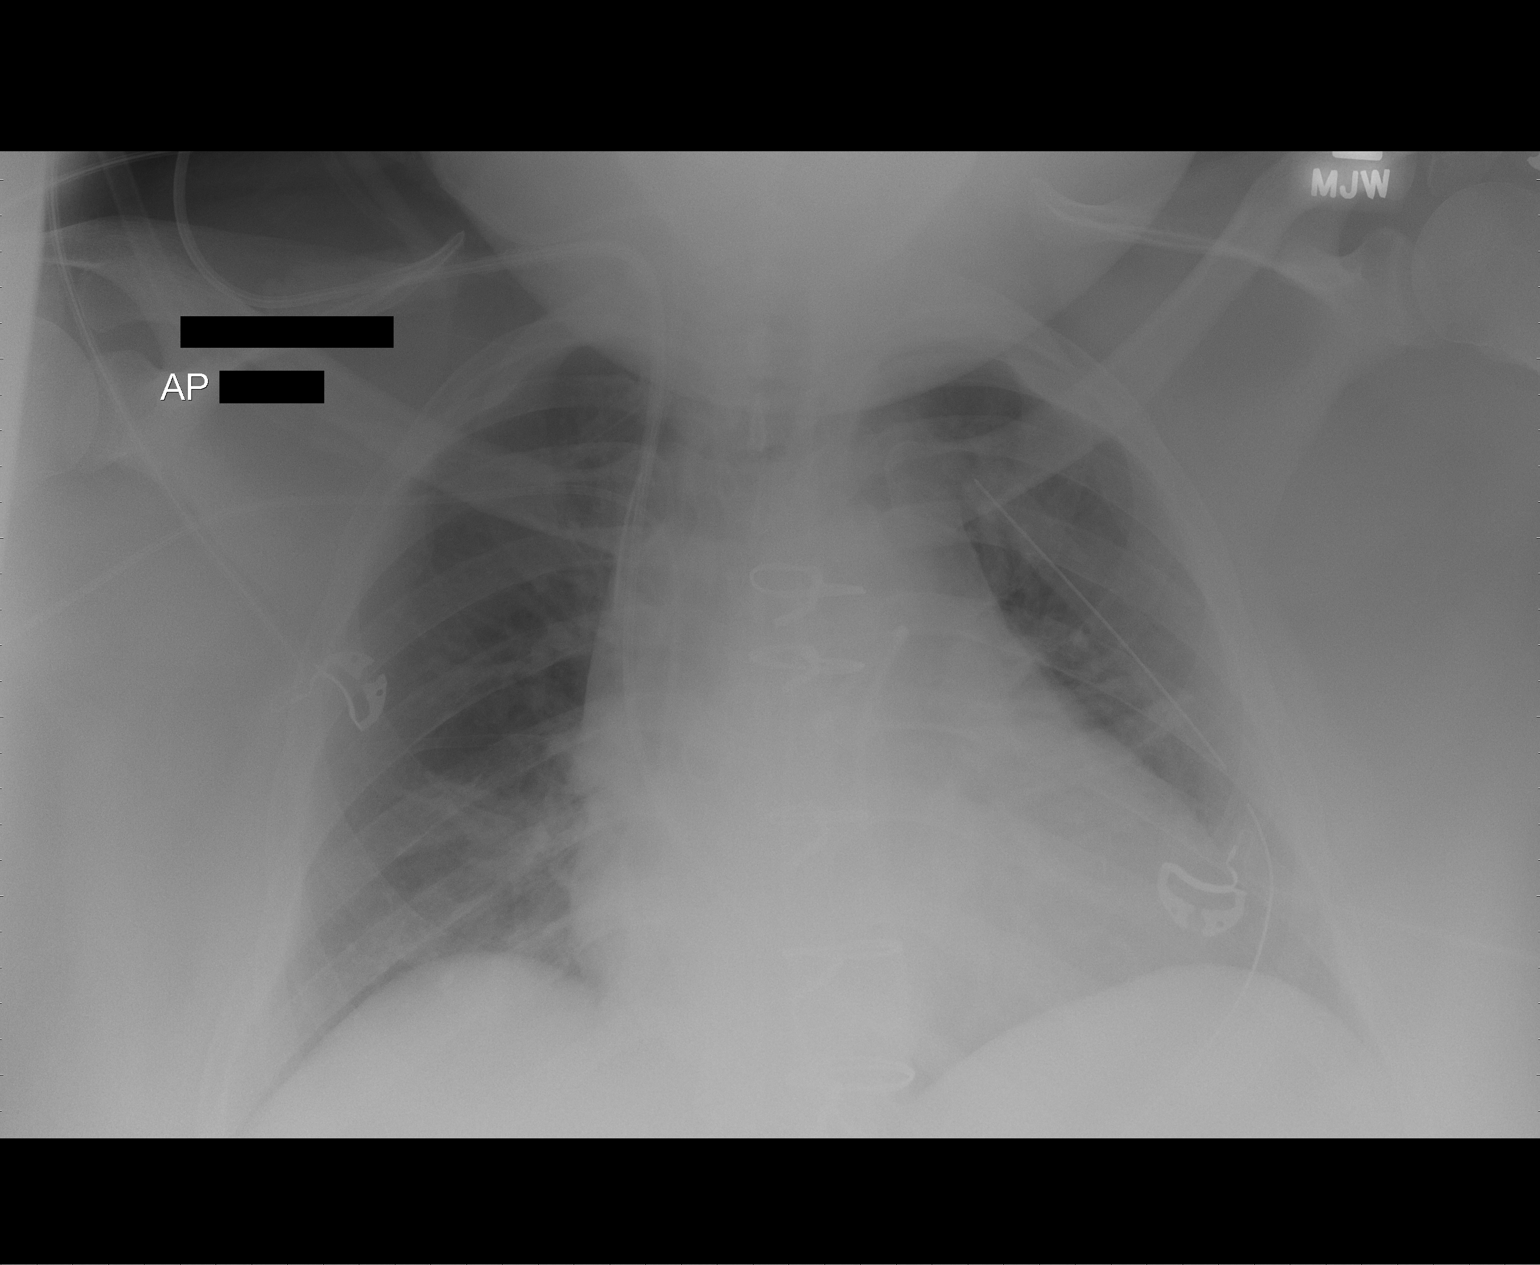

[1 of 1 positions shown; findings below may reference images not displayed]

FINDINGS: ET tube and midline drain removed.  Other tubes and lines
remain in place.  Heart mildly enlarged but no congestive heart
failure.  Mild right lower lobe atelectasis.
IMPRESSION: 1.  ET tube and midline drains removed.
2.  Mild right lower lobe atelectasis.
3.  Mild cardiomegaly without vascular congestion.

## 2010-07-04 ENCOUNTER — Other Ambulatory Visit: Payer: Self-pay | Admitting: Cardiovascular Disease

## 2010-07-05 IMAGING — CR DG CHEST 2V
2 series · 2 of 2 positions shown · non-contrast
Comparison: Chest 01/11/2009.

Addendum Begins

I spoke with [place], [place] on the IV team.  We discussed
her plan to pull the PICC line back another 2 cm.  The patient is
scheduled to have a chest x-ray performed on 01/14/2009.
Addendum Ends
CLINICAL DATA: Status post CABG.
CHEST - 2 VIEW

[w chest pa]
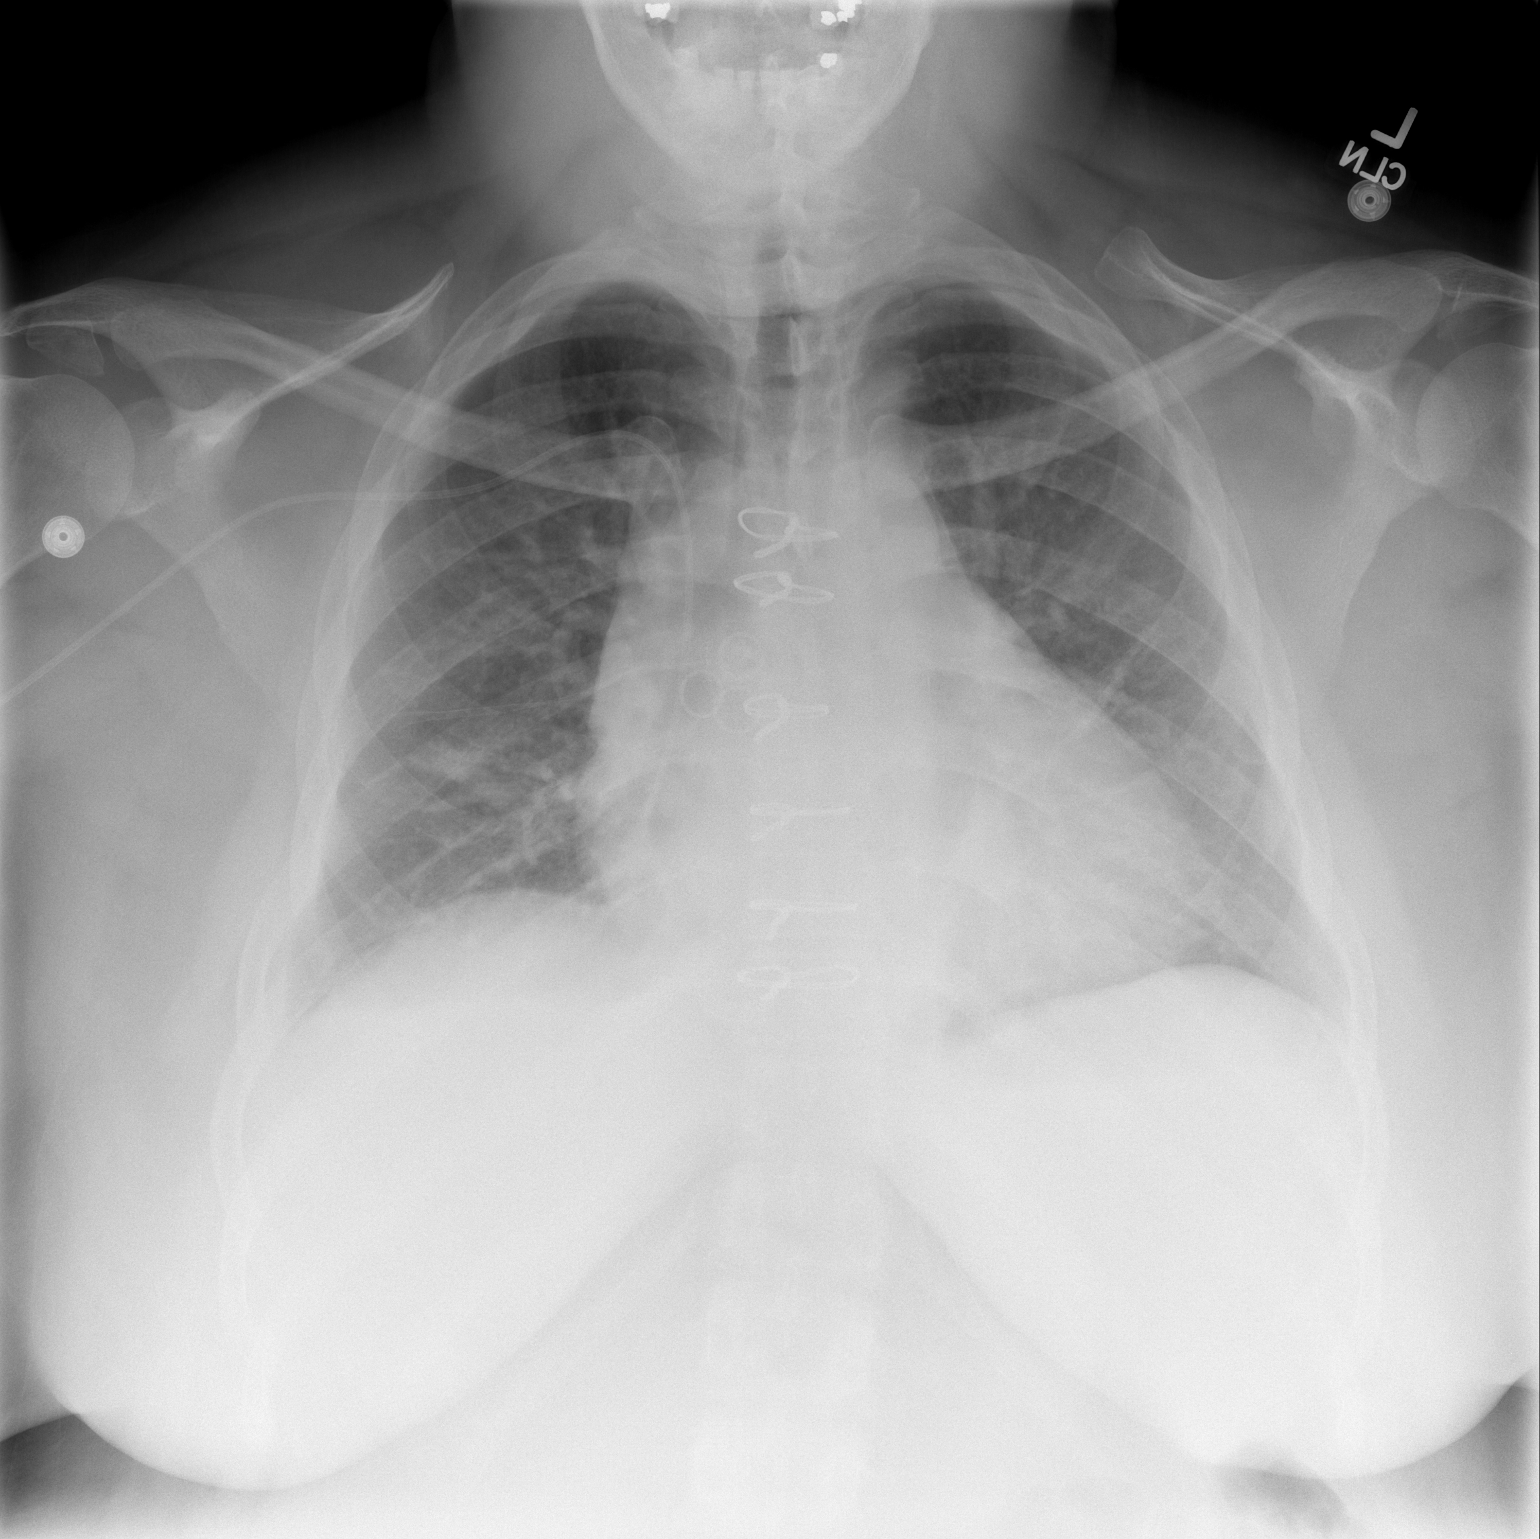

[w chest lat]
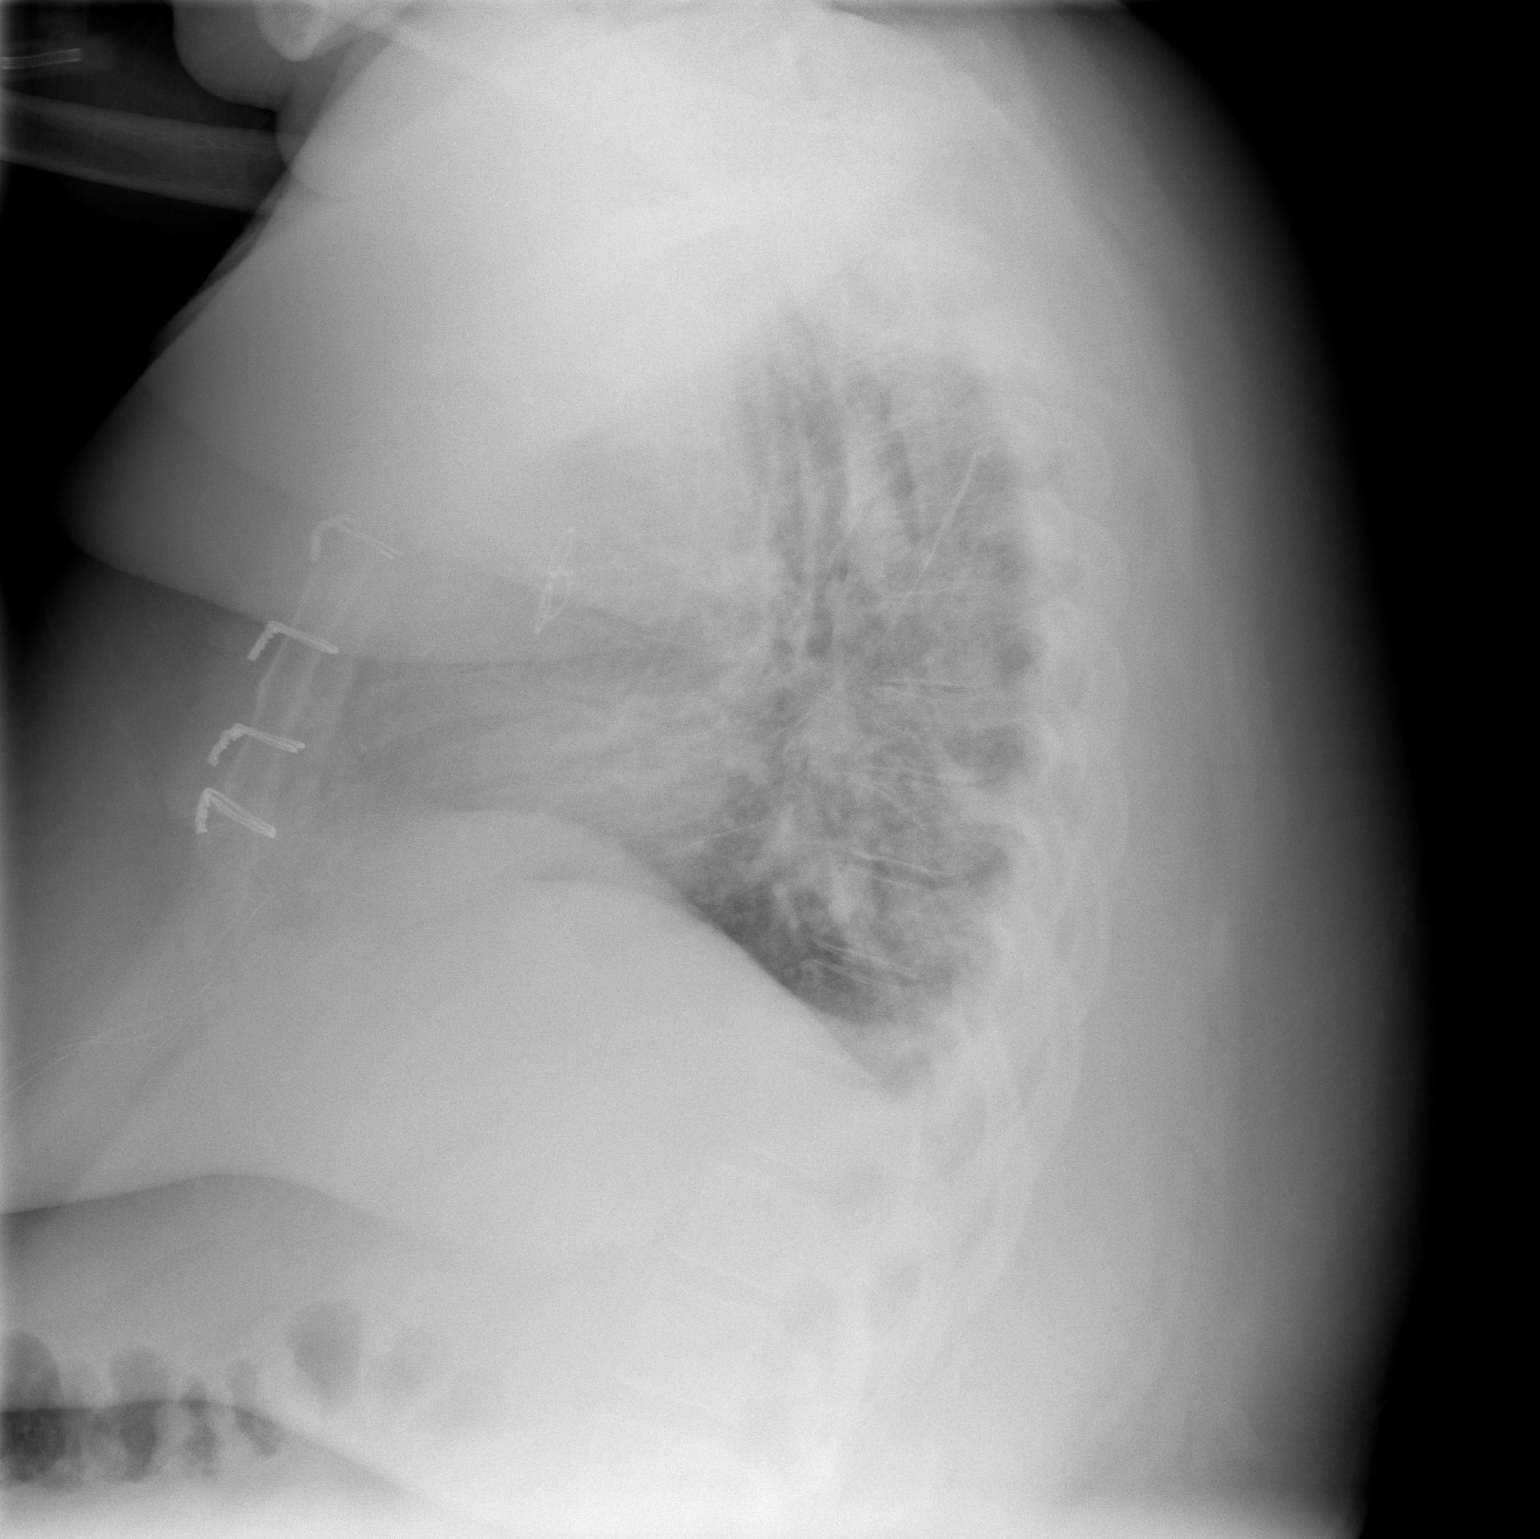

[2 of 2 positions shown; findings below may reference images not displayed]

FINDINGS: Right PICC remains in place with its tip in the right
atrium, unchanged.  The patient has very small bilateral pleural
effusions.  There is cardiomegaly but no pulmonary edema.
IMPRESSION: 1.  Small bilateral pleural effusions.
2.  Cardiomegaly without edema.

## 2010-07-07 IMAGING — CR DG CHEST 2V
2 series · 2 of 2 positions shown · non-contrast
Comparison: 01/12/2009

CLINICAL DATA: Status post CABG

CHEST - 2 VIEW

[w chest pa]
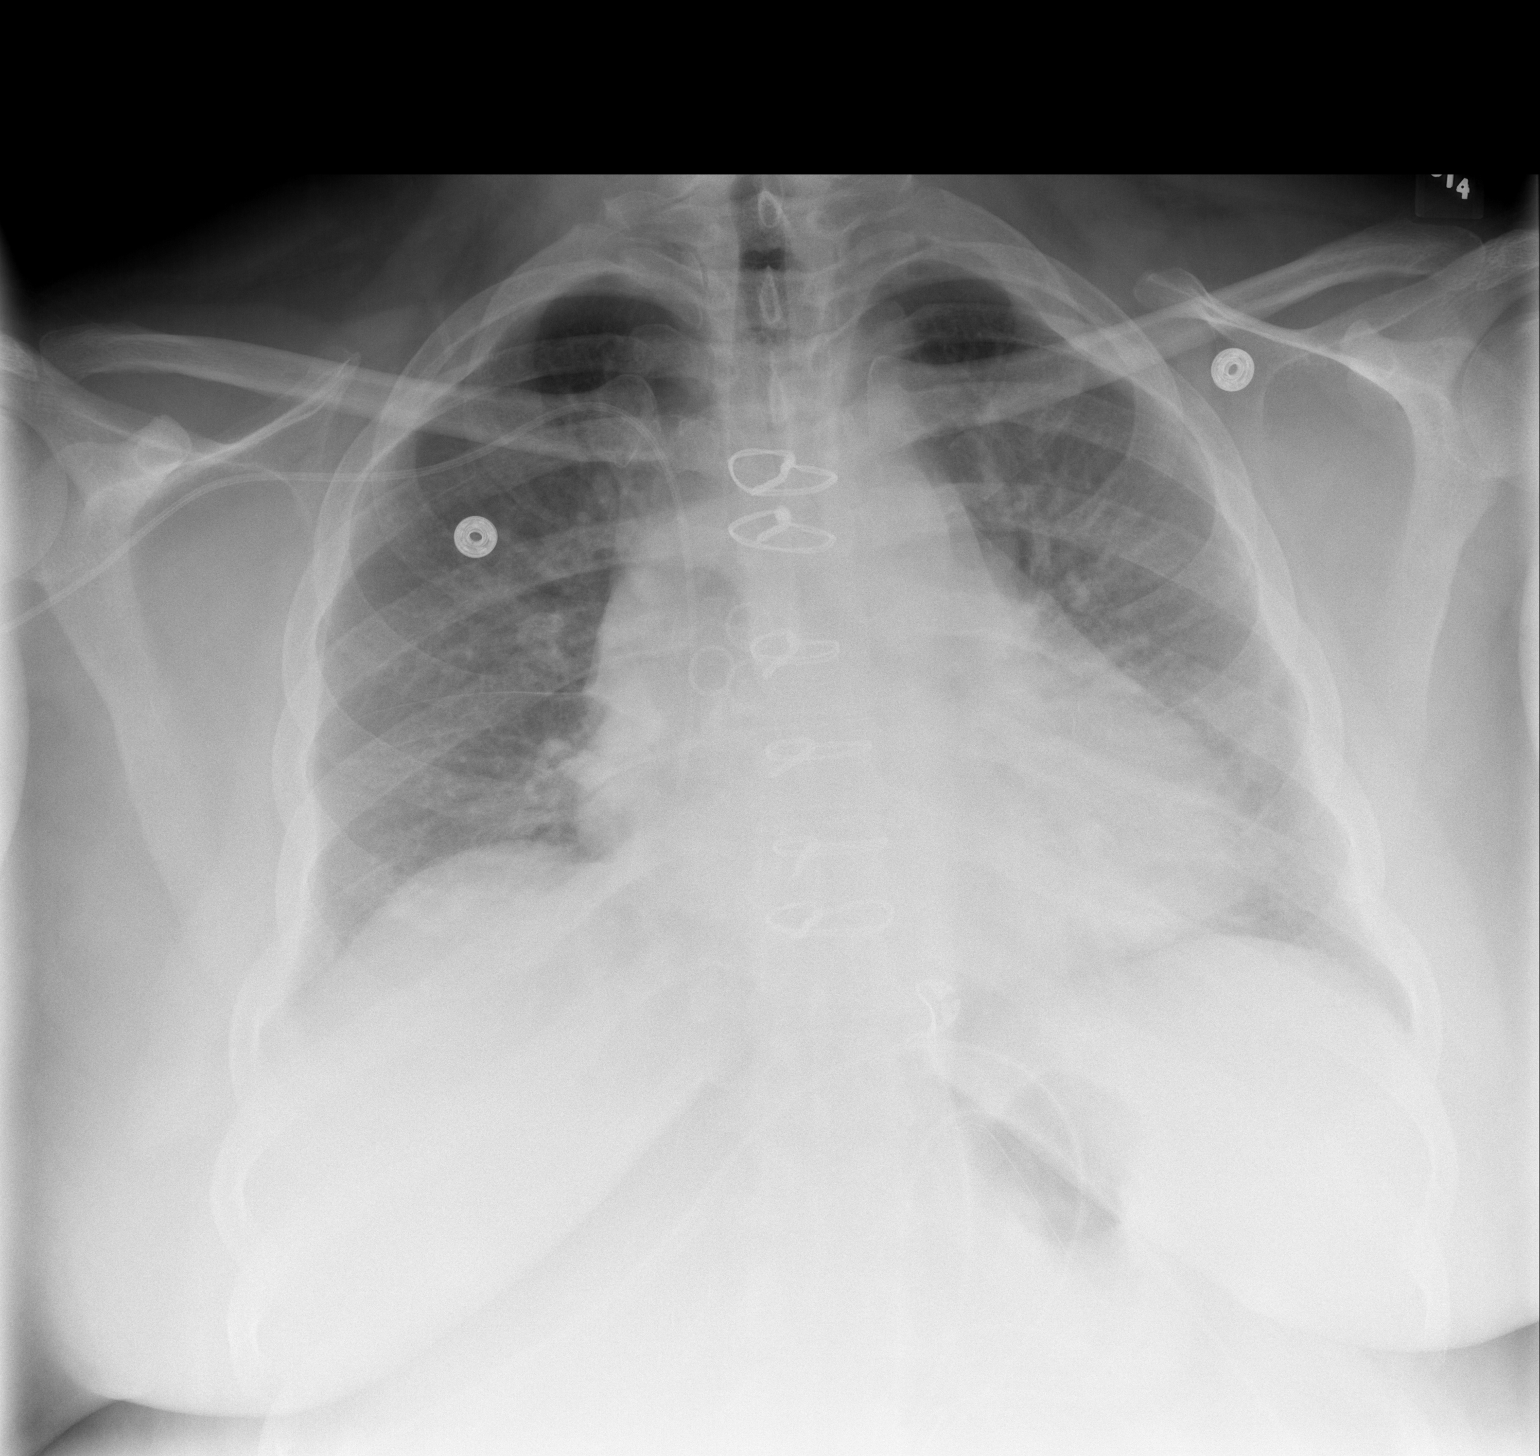

[w chest lat *]
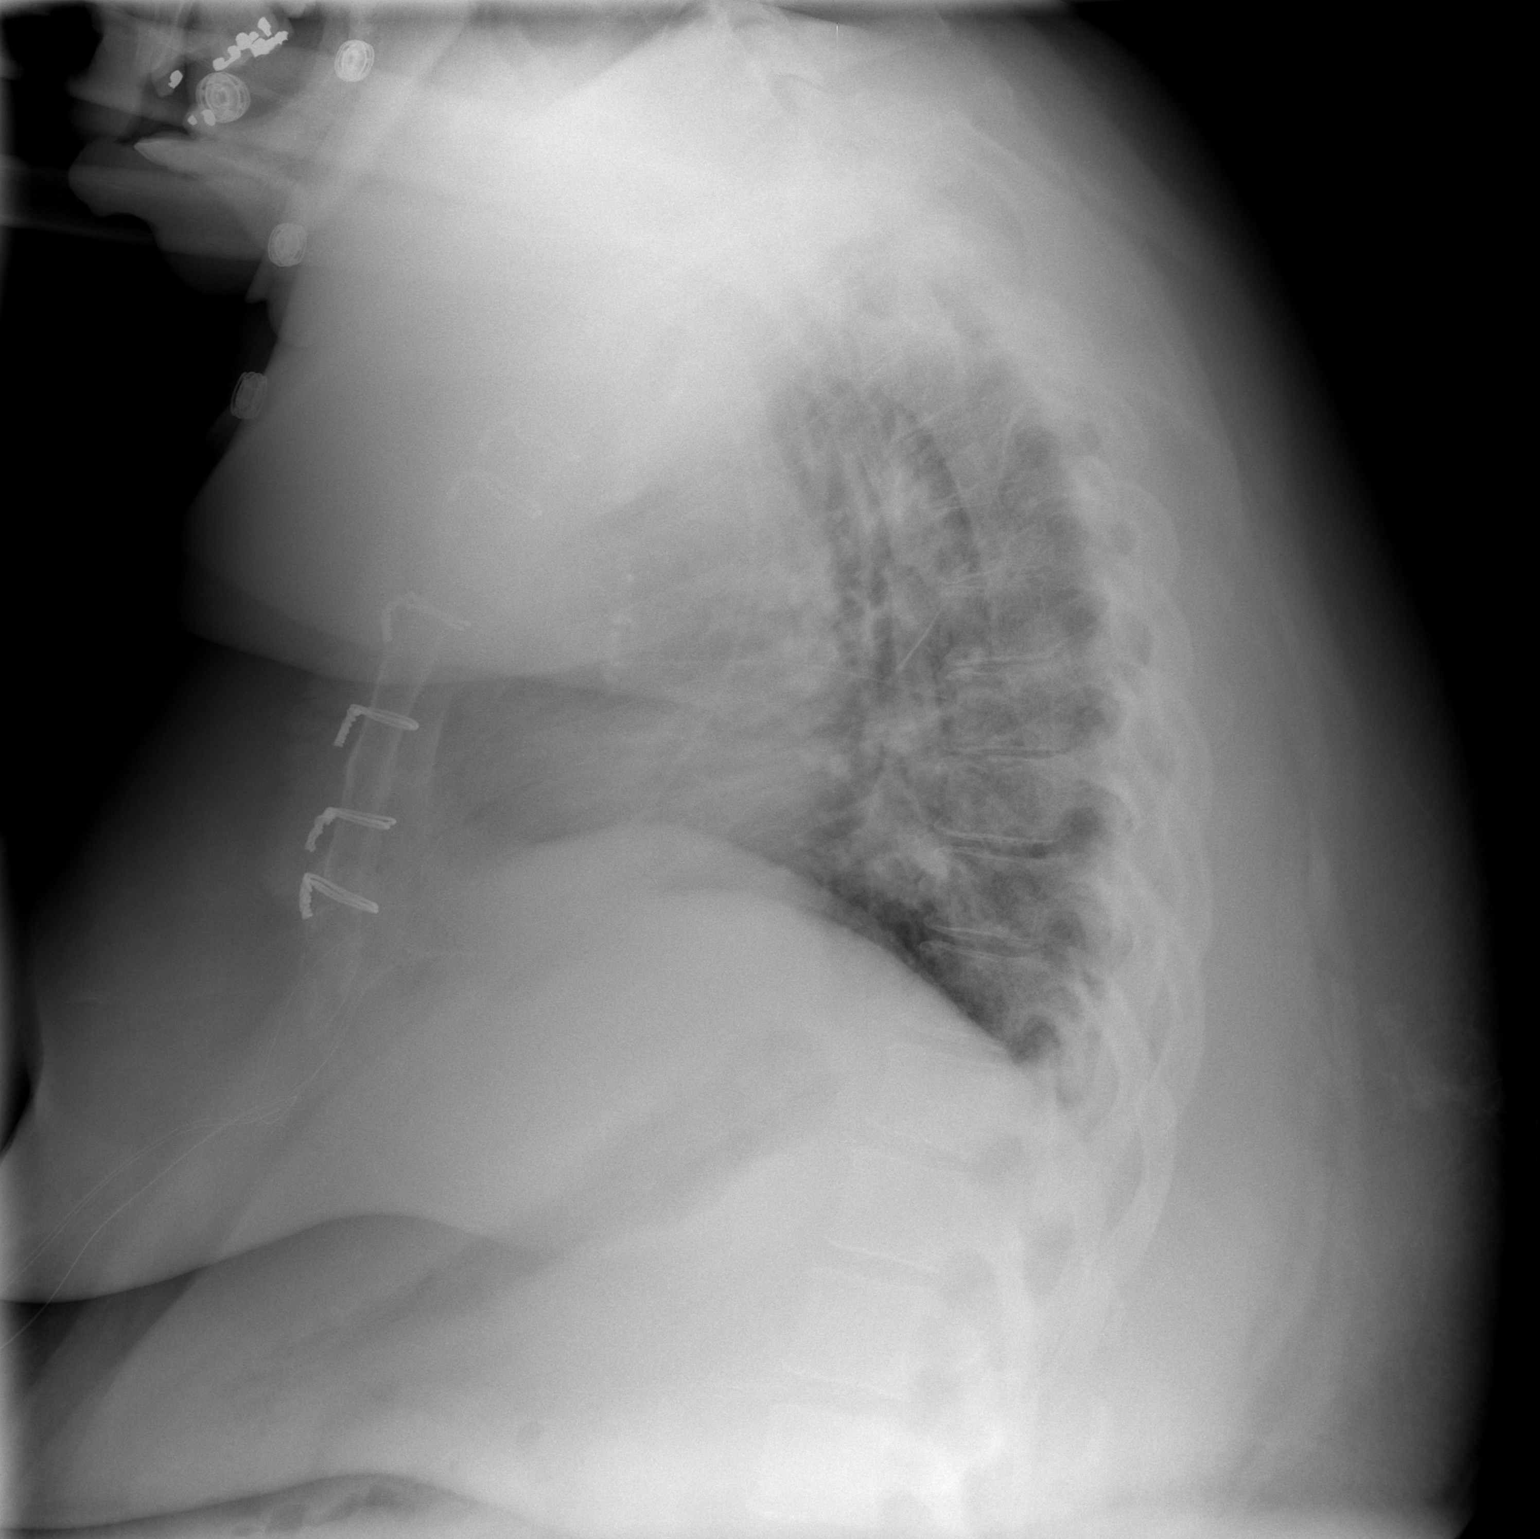

[2 of 2 positions shown; findings below may reference images not displayed]

FINDINGS: There is a right arm PICC line with tip in the cavoatrial
junction.

The heart size is enlarged.

No pleural effusion or pulmonary edema.
IMPRESSION: 1.  Cardiac enlargement without heart failure.

## 2010-07-08 ENCOUNTER — Encounter: Payer: Self-pay | Admitting: Cardiology

## 2010-07-09 ENCOUNTER — Ambulatory Visit (INDEPENDENT_AMBULATORY_CARE_PROVIDER_SITE_OTHER): Payer: Medicare Other | Admitting: Cardiology

## 2010-07-09 ENCOUNTER — Encounter: Payer: Self-pay | Admitting: Cardiology

## 2010-07-09 DIAGNOSIS — I2510 Atherosclerotic heart disease of native coronary artery without angina pectoris: Secondary | ICD-10-CM

## 2010-07-09 DIAGNOSIS — I25810 Atherosclerosis of coronary artery bypass graft(s) without angina pectoris: Secondary | ICD-10-CM

## 2010-07-09 DIAGNOSIS — I5032 Chronic diastolic (congestive) heart failure: Secondary | ICD-10-CM

## 2010-07-09 DIAGNOSIS — I509 Heart failure, unspecified: Secondary | ICD-10-CM

## 2010-07-09 DIAGNOSIS — R0602 Shortness of breath: Secondary | ICD-10-CM

## 2010-07-09 DIAGNOSIS — I5030 Unspecified diastolic (congestive) heart failure: Secondary | ICD-10-CM

## 2010-07-09 DIAGNOSIS — E785 Hyperlipidemia, unspecified: Secondary | ICD-10-CM

## 2010-07-09 LAB — BASIC METABOLIC PANEL
Calcium: 9.1 mg/dL (ref 8.4–10.5)
Chloride: 100 mEq/L (ref 96–112)
Creatinine, Ser: 0.7 mg/dL (ref 0.4–1.2)
Sodium: 139 mEq/L (ref 135–145)

## 2010-07-09 LAB — BRAIN NATRIURETIC PEPTIDE: Pro B Natriuretic peptide (BNP): 38.7 pg/mL (ref 0.0–100.0)

## 2010-07-09 LAB — LIPID PANEL
Cholesterol: 125 mg/dL (ref 0–200)
Triglycerides: 227 mg/dL — ABNORMAL HIGH (ref 0.0–149.0)
VLDL: 45.4 mg/dL — ABNORMAL HIGH (ref 0.0–40.0)

## 2010-07-09 LAB — LDL CHOLESTEROL, DIRECT: Direct LDL: 65.8 mg/dL

## 2010-07-09 LAB — HEPATIC FUNCTION PANEL
AST: 17 U/L (ref 0–37)
Albumin: 3.7 g/dL (ref 3.5–5.2)
Alkaline Phosphatase: 91 U/L (ref 39–117)
Total Protein: 7.1 g/dL (ref 6.0–8.3)

## 2010-07-09 LAB — CK: Total CK: 292 U/L — ABNORMAL HIGH (ref 7–177)

## 2010-07-09 NOTE — Patient Instructions (Signed)
Lab today---BMP/bnp/lipid profile/liver profile/CK  414.05    Schedule an appointment to see Dr Aundra Dubin in 3 months.

## 2010-07-10 NOTE — Assessment & Plan Note (Signed)
Patient is morbidly obese so volume status is difficult to determine.  EF preserved on recent echo.  Suspect volume status is ok today, will continue current Lasix dose.  Check BMET/BNP today.
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
## 2010-07-10 NOTE — Assessment & Plan Note (Signed)
Very aggressive coronary disease.  Now has had 4 more drug-eluting stents placed, 1 in the SVG-LAD and 3 to open the totally occluded native RCA after occlusion of the SVG-PDA. She was deemed not to be a candidate for re-do CABG this last admission by cardiac surgery. Unfortunately, due to her body habitus, the surgeons were unable to mobilize her LIMA for grafting during her CABG operation. Continue Effient long-term, Coreg, ASA, ACEI, statin, Imdur.  EF still preserved.  Needs aggressive management of risk factors. Chest pain does seem improved by recent procedures.  Continue cardiac rehab in [facility]

## 2010-07-10 NOTE — Assessment & Plan Note (Signed)
[name] has been able to tolerate Crestor 5mg  daily with no problem.  Will check lipids/LFTs/CPK on this dose.  Goal LDL < 70.

## 2010-07-21 ENCOUNTER — Other Ambulatory Visit: Payer: Self-pay | Admitting: *Deleted

## 2010-07-21 ENCOUNTER — Other Ambulatory Visit: Payer: Self-pay | Admitting: Nurse Practitioner

## 2010-07-21 MED ORDER — FUROSEMIDE 40 MG PO TABS: ORAL_TABLET | ORAL | Status: DC

## 2010-08-05 NOTE — Assessment & Plan Note (Signed)
OFFICE VISIT   [name], [name]  DOB:  01-05-1966                                        February 08, 2009  CHART #:  [id number]   The patient is status post coronary artery bypass grafting x3 done by  Dr. Prescott Gum, January 08, 2009.  The patient was last seen in the  office, January 25, 2009, with complaints of right hand numbness,  weakness and some dizziness.  At that time, the patient was also  followed up for her sternal drainage.  She had been on Omnicef at the  time of discharge.  On presentation today, the patient states she still  has some dizziness with ambulation.  She still complains of right-handed  numbness.  She has nausea associated with eating described as a faint  nausea and no vomiting.  Positive bowel movements.  She still has some  sternal incisional pain.  The patient is still taking the narcotics to  assist with pain control.  Physical therapy continues to come up to  their house and is working with the patient.  Her strength has slightly  improved since last seen.  The patient states that she has seen the  cardiologist and her regular doctor since last seen in the office.  She  plans to have some routine blood work done at her primary care doctor's  office within the next several weeks including hemoglobin A1c.  Her  blood sugars have been stable but recently have gone up above 200.  She  continues to ambulate.  The patient was given a course of antibiotics  Omnicef for mild leg cellulitis.  She does state that this is improving.   PHYSICAL EXAMINATION:  Vital Signs:  Blood pressure of 124/70, pulse  108, respirations of 16, O2 sats 97%, weight of 324 pounds down from 339  last seen in the office.  Respiratory:  Clear to auscultation  bilaterally.  Cardiac:  Regular rate and rhythm.  No murmurs noted.  Abdomen:  Soft, nontender.  Extremities:  No edema noted.  Warm.  Incisions:  The patient's sternal incision is clean, dry and  intact, and  healing well.  Her left EVH site at the knee is healing as well.  No  drainage or cellulitis noted.   IMPRESSION AND PLAN:  The patient continues to progress well.  The  patient was seen and evaluated by Dr. Prescott Gum.  Plan is to stop taking  Lasix and potassium at this point.  She is to take the Lasix as needed.  The patient is also to stop taking Plavix when she runs out of her  current prescription.  The patient is to start taking multivitamin to  assist improving numbness in the her hand.  She is to continue working  with physical therapy at home.  She is still instructed no lifting over  10 pounds and no driving at this point.  Her left lower extremity  endoscopic vein harvest site is noted to be healing well and we did not  prescribe any further antibiotics at this point.  She was given a  prescription for Darvocet total number 40 tablets today.  We will plan  to follow up with the patient on March 01, 2009 with a followup chest  x-ray as well.  The patient is told in the interim, if  she has any  surgical questions, she is to contact us.  The patient is in agreement.   Ivin Poot, M.D.  Electronically Signed   KMD/MEDQ  D:  02/08/2009  T:  02/09/2009  Job:  [id number]

## 2010-08-05 NOTE — Assessment & Plan Note (Signed)
OFFICE VISIT   DOYLE, ASKELSON  DOB:  11/19/1965                                        March 01, 2009  CHART #:  [id number]   HISTORY OF PRESENT ILLNESS:  This a 45 year old African American female  who is status post CABG x3 on January 08, 2009.  She has been seen in  the office on 2 previous visits for right hand numbness, overall  weakness and dizziness, sternal drainage, and left lower extremity  cellulitis.  The patient has had resolution of her weakness, dizziness,  and sternal drainage.  She still has some numbness to the first 3  fingers on her right hand, although this is slightly improved.  In  addition, her left lower extremity wound has dehisced.  She was seen by  Dr. Rosana Hoes (her medical doctor) who put her on Septra DS.  Currently, the  patient denies any shortness of breath, chest pain, fever, or chills.   PHYSICAL EXAMINATION:  General:  This is a 30-year [place] American  female who is accompanied by her husband, who is in no acute distress  who is alert, oriented, and cooperative.  Vital Signs:  As follows.  BP  130/60, pulse rate 100, respirations 18, and O2 sat 98% on room air.  Cardiovascular:  Regular rate and rhythm.  No murmurs, gallops, or rubs.  Pulmonary:  Slightly diminished breath sounds bilaterally, but no rales,  wheezes, or rhonchi.  Abdomen:  Soft and nontender.  Bowel sounds  present.  Extremities:  Mild lower extremity edema, left greater than  right.  The [facility] site is dehisced.  There is some fluffy tissue, this was  debrided and packed, gauze and tape as well as a Kerlix wrap were then  placed on the wound.  No cellulitis noted.  Chest:  Sternal wound is  clean, dry, and well healed.  Sternum is solid.  No crepitus noted.  Chest x-ray done today showed mild cardiomegaly (decreased from previous  chest x-ray with low lung volumes).   IMPRESSION AND PLAN:  The patient continues to make progress status post  coronary  artery bypass graft x3.  She was seen and evaluated by Dr. Prescott Gum.  She is instructed to continue the Septra DS until gone.  In  addition, she was instructed to remove the packing from the left [facility]  site.  She is to use soap and water and apply Neosporin to the wound and  change the dressing daily.  She will be seen in followup on March 11, 2009 to further evaluate the wound.  The patient was instructed if she  has any fever, chills, increased drainage, erythema, or tenderness, she  is to contact the office immediately.  The patient was also instructed  to continue with her Coreg as she has been taken postoperatively.  She  does have a followup appointment to see Dr. Denman George, her cardiologist in 2  weeks.  The patient brought a log of her blood sugars.  They have mostly  been in the 150s, occasional 180s.  She is going to continue be followed  by Dr. Rosana Hoes regarding further diabetes management.  The patient was  instructed she may begin cardiac rehab in the next couple of weeks.  In  addition, she will be able to drive once her left lower extremity  wound  is a little well healed, and she is no longer taking narcotics for pain.  The patient was given a prescription for Lortab 7.5 to take every 4-6  hours p.r.n. pain.  In addition, the patient had been previously placed  back on Lasix 80 mg p.o. in the a.m. and 40 mg p.o. in the p.m. p.r.n.  by her medical doctor.  She is to continue with this regimen until she  sees the cardiologist in followup.   Ivin Poot, M.D.  Electronically Signed   DZ/MEDQ  D:  03/01/2009  T:  03/02/2009  Job:  [id number]   cc:   Alean Rinne, MD  Kerry Dory, MD

## 2010-08-05 NOTE — Assessment & Plan Note (Signed)
OFFICE VISIT   [name], [name]  DOB:  [birth year]                                        March 21, 2009  CHART #:  [id number]   HISTORY:  The patient is seen in followup on today's date for her left  lower extremity EVH wound.  She is a 45 year old black female who is  status post coronary artery bypass grafting x3 on January 08, 2009.  She  has most recently seen in March 11, 2009, by Dr. Prescott Gum.  On  today's date, she reports that she is doing twice daily dressing changes  using saline or hydrogen peroxide in the wound to clean it.  She  continues to have some lower extremity peripheral edema.  There is no  fevers, chills, or other constitutional symptoms.   PHYSICAL EXAMINATION:  Vital Signs:  Blood pressure 128/70, pulse 93,  respirations 18, oxygen saturation is 98% on room air.  Examination in  the left lower extremity reveals a small wound below the left knee,  which is approximately 0.5 cm in depth.  There is scanty elevation and  drainage.  No surrounding erythema.  No warmth associated with the  wound.  Note, I probed the wound with Q-tip, and it does not appear to  tract.   ASSESSMENT:  The patient is continuing slow healing of her superficial  wound associated with her left lower extremity endoscopic vein  harvesting site.  I have instructed her to continue with daily rinsing  with either the saline or peroxide.  She is then to cover gently with a  sterile 2/2 dressing.  It does not appear to require any debridement at  this time.  There is no associated cellulitis or active infection.  We  will see her again in 3 weeks to reassess the wound.   John Giovanni, P.A.-C.   [name]  D:  03/21/2009  T:  03/22/2009  Job:  [id number]   cc:   Ivin Poot, M.D.  Coy Saunas, MD

## 2010-08-05 NOTE — Assessment & Plan Note (Signed)
OFFICE VISIT   [name], [name]  DOB:  19-Sep-1965                                        March 11, 2009  CHART #:  [id number]   REASON FOR OFFICE VISIT:  Evaluation of left EVH wound.   HISTORY OF PRESENT ILLNESS:  This is a 45 year old African American  female who is status post CABG x3 on January 08, 2009.  She was last  seen in the office on March 01, 2009, at which time she was found to  have dehiscence of her [facility] site of her left lower extremity.  She  underwent a superficial debridement and was instructed to use soap and  water and apply Neosporin to the wound and change the dressing daily.  She had already finished a full course of Septra DS.  The patient  reports no fever, chills, increased drainage or redness.   PHYSICAL EXAMINATION:  General:  This is a pleasant 45 year old [place]  American female who is in no acute distress who is alert, oriented, and  cooperative and is accompanied by her husband.  Vital Signs:  BP 116/70,  pulse rate 92, respirations 18, O2 sat 97% on room air.  Wounds:  The  sternum is clean, dry, well healed, no crepitus.  The left EVH site had  some superficial fluffy tissue.  No purulence, no erythema noted.  The  wound appears to be about the depth of the head of a Q-tip.  Again, the  wound was superficially debrided and fluffy tissue was removed.  The  patient does have some granulation present.  The wound was then packed  with a small piece of gauze, dampened with normal saline.  A small dry  gauze and Kerlix and tape were then applied.   IMPRESSION AND PLAN:  Overall, the patient is surgically stable status  post coronary artery bypass graft x3.  Regarding her left endoscopic  vein harvesting wound, the patient instructed to remove the gauze on  March 12, 2009.  She may then continue to use soap and water, and she  is to change her dressing daily.  She is no longer to use Neosporin on  the wound.  She was  instructed if she has any increase in drainage, any  erythema, any fever or chills, she is to contact  the office immediately, otherwise she will be seen in follow up on  Monday, March 18, 2009.  At that time, it will be determined whether  or not further debridement is necessary.   Ivin Poot, M.D.  Electronically Signed   DZ/MEDQ  D:  03/11/2009  T:  03/12/2009  Job:  [id number]   cc:   Coy Saunas, MD

## 2010-08-05 NOTE — Assessment & Plan Note (Signed)
OFFICE VISIT   [name], [name]  DOB:  Oct 07, 1965                                        January 25, 2009  CHART #:  [id number]   CURRENT PROBLEMS:  1. Status post coronary artery bypass graft x3 for severe three-vessel      disease with unstable angina on January 08, 2009.  2. Morbid obesity.  3. Diabetes.   PRESENT ILLNESS:  The patient returns for first office visit, wound  check after multivessel bypass grafting after she presented with  unstable angina.  She is a poorly controlled diabetic with severe three-  vessel disease and had a slow, but stable postoperative recovery.  She  remained in a sinus rhythm, but had some fluid retention and difficult  to control blood glucose levels.  She was discharged home on Coreg,  enalapril, Lasix, Tussionex, insulin, potassium, Crestor, aspirin,  Elavil, Celexa, Neurontin, and metformin.   She states her blood sugars have been in the 170-180 range.  She denies  fever.  She has been short of breath, but is walking daily.  She has  some mild edema in the left leg at the vein harvest site.  She has no  drainage from the surgical incisions.  Her weight has dropped from 344  to 339 since her discharge from the hospital.  She complains of right  hand numbness and weakness mainly in the radial distribution.   PHYSICAL EXAMINATION:  Vital Signs:  Blood pressure 130/80, pulse 90,  respirations 18, and saturation 98%.  General:  She appears to be doing  very well.  Lungs:  Her breath sounds are somewhat diminished, but  clear.  Chest:  Her sternum is stable.  Incision is healing well.  A  small suture granuloma at the top of the incision is removed and a  Neosporin Band-Aid dressing applied.  Cardiac:  Rhythm is regular and  there is no S3 gallop or murmur.  Extremities:  The vein was harvested  from the left leg and the incision at the knee is healed.  She has some  rather tight tissue edema which is nontender without  warmth or evidence  of cellulitis except for the soft tissue just below the knee.  At this  point, it is mildly erythematous.   PA and lateral chest x-ray shows clear lung fields, no pleural effusion  and the sternal wires are intact.   PLAN:  Reinstituting a course of antibiotics (Omnicef) for her mild leg  cellulitis.  She was also given refills for her potassium and Lasix and  she requested an elixir of hydrocodone for pain as she has difficulty  swallowing the pills.  I will plan on seeing her back in 2 weeks for  wound check and a general review of her status.   Ivin Poot, M.D.  Electronically Signed   PV/MEDQ  D:  01/25/2009  T:  01/26/2009  Job:  [id number]   cc:   Loralie Champagne, MD

## 2010-09-17 ENCOUNTER — Other Ambulatory Visit: Payer: Self-pay | Admitting: Cardiology

## 2010-10-10 ENCOUNTER — Encounter: Payer: Self-pay | Admitting: Cardiology

## 2010-10-14 ENCOUNTER — Encounter: Payer: Self-pay | Admitting: Cardiology

## 2010-10-14 ENCOUNTER — Ambulatory Visit (INDEPENDENT_AMBULATORY_CARE_PROVIDER_SITE_OTHER): Payer: Medicare Other | Admitting: Cardiology

## 2010-10-14 VITALS — BP 144/78 | HR 85 | Ht 67.0 in | Wt 324.8 lb

## 2010-10-14 DIAGNOSIS — I25810 Atherosclerosis of coronary artery bypass graft(s) without angina pectoris: Secondary | ICD-10-CM

## 2010-10-14 DIAGNOSIS — I10 Essential (primary) hypertension: Secondary | ICD-10-CM

## 2010-10-14 DIAGNOSIS — E785 Hyperlipidemia, unspecified: Secondary | ICD-10-CM

## 2010-10-14 DIAGNOSIS — I509 Heart failure, unspecified: Secondary | ICD-10-CM

## 2010-10-14 DIAGNOSIS — I5032 Chronic diastolic (congestive) heart failure: Secondary | ICD-10-CM

## 2010-10-14 NOTE — Assessment & Plan Note (Signed)
Stable NYHA class II symptoms.  Continue current Lasix dose.  Volume status difficult given body habitus but does not appear significantly volume overloaded.

## 2010-10-14 NOTE — Assessment & Plan Note (Signed)
Very aggressive coronary disease.  Now has had 4 more drug-eluting stents placed, 1 in the SVG-LAD and 3 to open the totally occluded native RCA after occlusion of the SVG-PDA. She was deemed not to be a candidate for re-do CABG this last admission by cardiac surgery. Unfortunately, due to her body habitus, the surgeons were unable to mobilize her LIMA for grafting during her CABG operation. Continue Effient long-term, Coreg, ASA, ACEI, statin, Imdur.  EF still preserved.  Needs aggressive management of risk factors. Chest pain does seem improved by recent procedures.  She has finished cardiac rehab but needs to continue regular exercise.

## 2010-10-14 NOTE — Assessment & Plan Note (Signed)
BP mildly elevated today but has been doing well at home.  Continue current meds.

## 2010-10-14 NOTE — Patient Instructions (Signed)
Schedule an appointment to see Dr Aundra Dubin in 4 months.   You have the order for fasting lab to be done in October 2012--lipid profile/liver profile/BMP 414.05

## 2010-10-14 NOTE — Assessment & Plan Note (Signed)
Will get fasting lipids prior to next followup appointment.
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
## 2010-10-19 ENCOUNTER — Other Ambulatory Visit: Payer: Self-pay | Admitting: Cardiology

## 2010-11-17 ENCOUNTER — Other Ambulatory Visit: Payer: Self-pay | Admitting: Cardiology

## 2010-12-18 ENCOUNTER — Telehealth: Payer: Self-pay | Admitting: Cardiology

## 2010-12-18 NOTE — Telephone Encounter (Signed)
Pt calling needing surgical clearance for dental procedures. The dentist office is [facility] [phone]. Please return pt call w/ questions.   Pt said she is in pain and needs msg sent right away.

## 2010-12-18 NOTE — Telephone Encounter (Signed)
Spoke with pt. She is on the way to the dentist now to be evaluated. She will call us back or have dentist's office call us back after it is determined what type of procedure she needs to have done.

## 2010-12-18 NOTE — Telephone Encounter (Signed)
Received call from [place] at [facility]. Pt is in office now with painful tooth and cavity and needs tooth pulled today. They are requesting surgical clearance. Also need to know if pt needs to be off blood thinners. She is on ASA and Effient and has not taken them yet today. They will fax surgical clearance. Office number is [phone]. Will review with Dr. Ron Parker (DOD)

## 2010-12-18 NOTE — Telephone Encounter (Signed)
Pt calling Pat back. Please return call.

## 2010-12-18 NOTE — Telephone Encounter (Signed)
Left message to call back  

## 2010-12-18 NOTE — Telephone Encounter (Signed)
Clearance form completed by Dr. Ron Parker and faxed to [facility]. Pt is to hold Effient 9/27 and 9/28. She is to hold ASA today and resume 9/28 and it was noted she may have extra bleeding. Pt has been cleared to have tooth pulled today. I confirmed with Charlene at dental office that she had received fax

## 2011-01-01 ENCOUNTER — Telehealth: Payer: Self-pay | Admitting: Cardiology

## 2011-01-01 NOTE — Telephone Encounter (Signed)
I talked with pt. I have left samples of Crestor 5mg  daily #56 and Effient 10mg  daily #28 with an Effient copay card at the front desk for pt to pick up.

## 2011-01-01 NOTE — Telephone Encounter (Signed)
Pt needs some samples of crestor 5 mg. Please return pt call to discuss further.

## 2011-01-23 ENCOUNTER — Other Ambulatory Visit: Payer: Self-pay | Admitting: Cardiology

## 2011-02-02 ENCOUNTER — Telehealth: Payer: Self-pay | Admitting: Cardiology

## 2011-02-02 NOTE — Telephone Encounter (Signed)
Pt calling regarding getting disability paperwork. Pt said she needs information stating that pt has been in and out of hospital. Pt was a little unsure as to what information she needs. Please return pt call to discuss further.

## 2011-02-02 NOTE — Telephone Encounter (Signed)
Left message to call back  

## 2011-02-03 NOTE — Telephone Encounter (Signed)
Routing to [employer]

## 2011-02-03 NOTE — Telephone Encounter (Signed)
I talked with pt about disability paperwork that needs to be completed. She will mail or bring the paperwork to Dr Aundra Dubin with the fax number she would like the paperwork returned to.

## 2011-02-06 ENCOUNTER — Encounter: Payer: Self-pay | Admitting: Cardiology

## 2011-02-09 ENCOUNTER — Encounter: Payer: Self-pay | Admitting: Cardiology

## 2011-02-09 ENCOUNTER — Ambulatory Visit (INDEPENDENT_AMBULATORY_CARE_PROVIDER_SITE_OTHER): Payer: Medicare Other | Admitting: Cardiology

## 2011-02-09 DIAGNOSIS — I25810 Atherosclerosis of coronary artery bypass graft(s) without angina pectoris: Secondary | ICD-10-CM

## 2011-02-09 DIAGNOSIS — I10 Essential (primary) hypertension: Secondary | ICD-10-CM

## 2011-02-09 DIAGNOSIS — I5032 Chronic diastolic (congestive) heart failure: Secondary | ICD-10-CM

## 2011-02-09 DIAGNOSIS — I509 Heart failure, unspecified: Secondary | ICD-10-CM

## 2011-02-09 DIAGNOSIS — E785 Hyperlipidemia, unspecified: Secondary | ICD-10-CM

## 2011-02-09 NOTE — Assessment & Plan Note (Signed)
Stable NYHA class II symptoms.  Continue current Lasix dose.  Volume status difficult given body habitus but does not appear significantly volume overloaded.

## 2011-02-09 NOTE — Assessment & Plan Note (Signed)
BP mildly elevated but she has not taken her am meds.  Checks frequently at home and always < [id number] systolic.

## 2011-02-09 NOTE — Assessment & Plan Note (Signed)
Very aggressive coronary disease.  Now has had 4 more drug-eluting stents placed, 1 in the SVG-LAD and 3 to open the totally occluded native RCA after occlusion of the SVG-PDA. She was deemed not to be a candidate for re-do CABG this last admission by cardiac surgery. Unfortunately, due to her body habitus, the surgeons were unable to mobilize her LIMA for grafting during her CABG operation. Continue Effient long-term since she is a poor Plavix responder, Coreg, ASA, ACEI, statin, Imdur.  She can lower ASA to 81 mg dialy. EF still preserved.  Needs aggressive management of risk factors. She reports some chest pain but it does not seem to be particularly exertional, seems more related to stress and anxiety.  Agree with increasing lorazepam dose.

## 2011-02-09 NOTE — Patient Instructions (Signed)
Decrease aspirin to 81mg  daily-this should be enteric coated.  Increase coenzyme Q10 to 200mg  daily.  Your physician recommends that you schedule a follow-up appointment in: 3 months with Dr Aundra Dubin.

## 2011-02-09 NOTE — Assessment & Plan Note (Signed)
Goal LDL < 70.  She had lipids done recently in [facility], will call for results.  Had elevated CPK with statin in the past, now on just 5 mg daily of Crestor.  I am not sure that her finger joint pain is related to the statin. It sounds like she had a CPK level recently with PCP, I will call to get result.  Continue Crestor for now.  She can increase her Coenzyme Q10 to 200 mg daily.
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
## 2011-03-12 ENCOUNTER — Telehealth: Payer: Self-pay | Admitting: Cardiology

## 2011-03-12 NOTE — Telephone Encounter (Signed)
LM on voice ID voice mail--Samples of Effient 10mg  #28 , Crestor 5mg  #7 , and Crestor 10mg  #21 to take 1/2 tablet daily  at front desk for pt to pick up

## 2011-03-12 NOTE — Telephone Encounter (Signed)
New message:  Pt called and wanted to know if there was any samples of Effient and Crestor she can have.  Please call and advise.

## 2011-04-09 ENCOUNTER — Telehealth: Payer: Self-pay | Admitting: Cardiology

## 2011-04-09 NOTE — Telephone Encounter (Signed)
New Problem:    Patient is calling in to request some samples of EFFIENT 10 MG TABS and rosuvastatin (CRESTOR) 5 MG tablet. Please call back.

## 2011-04-09 NOTE — Telephone Encounter (Signed)
Samples of Effient 10mg  #56 and Crestor 5mg  #28 left at front desk for pt to pick up. Pt aware.

## 2011-04-21 ENCOUNTER — Other Ambulatory Visit: Payer: Self-pay | Admitting: Cardiology

## 2011-05-14 ENCOUNTER — Encounter: Payer: Self-pay | Admitting: Cardiology

## 2011-05-14 ENCOUNTER — Ambulatory Visit (INDEPENDENT_AMBULATORY_CARE_PROVIDER_SITE_OTHER): Payer: Medicare Other | Admitting: Cardiology

## 2011-05-14 VITALS — BP 132/80 | HR 91 | Ht 67.0 in | Wt 324.0 lb

## 2011-05-14 DIAGNOSIS — R0602 Shortness of breath: Secondary | ICD-10-CM

## 2011-05-14 DIAGNOSIS — I5032 Chronic diastolic (congestive) heart failure: Secondary | ICD-10-CM

## 2011-05-14 DIAGNOSIS — I25810 Atherosclerosis of coronary artery bypass graft(s) without angina pectoris: Secondary | ICD-10-CM

## 2011-05-14 DIAGNOSIS — E785 Hyperlipidemia, unspecified: Secondary | ICD-10-CM

## 2011-05-14 DIAGNOSIS — I509 Heart failure, unspecified: Secondary | ICD-10-CM

## 2011-05-14 LAB — LIPID PANEL
Cholesterol: 136 mg/dL (ref 0–200)
HDL: 33.2 mg/dL — ABNORMAL LOW (ref >39.00)
Total CHOL/HDL Ratio: 4
Triglycerides: 318 mg/dL — ABNORMAL HIGH (ref 0.0–149.0)
VLDL: 63.6 mg/dL — ABNORMAL HIGH (ref 0.0–40.0)

## 2011-05-14 LAB — BASIC METABOLIC PANEL
BUN: 22 mg/dL (ref 6–23)
GFR: 97.62 mL/min (ref >60.00)
Potassium: 3.8 mEq/L (ref 3.5–5.1)

## 2011-05-14 LAB — BRAIN NATRIURETIC PEPTIDE: Pro B Natriuretic peptide (BNP): 11 pg/mL (ref 0.0–100.0)

## 2011-05-14 MED ORDER — NITROGLYCERIN 0.4 MG SL SUBL: 0.4000 mg | SUBLINGUAL_TABLET | SUBLINGUAL | Status: AC | PRN

## 2011-05-14 NOTE — Assessment & Plan Note (Addendum)
Goal LDL < 70.  Had elevated CPK with statin in the past, now on just 5 mg daily of Crestor. I am going to check lipids today.  No myalgias.

## 2011-05-14 NOTE — Assessment & Plan Note (Signed)
Stable NYHA class II symptoms.  Continue current Lasix dose.  Volume status difficult given body habitus but does not appear significantly volume overloaded. Will check BMET/BNP today.

## 2011-05-14 NOTE — Assessment & Plan Note (Signed)
Very aggressive coronary disease.  Had 4 more drug-eluting stents placed in 12/11, 1 in the SVG-LAD and 3 to open the totally occluded native RCA after occlusion of the SVG-PDA. She was deemed not to be a candidate for re-do CABG this last admission by cardiac surgery. Unfortunately, due to her body habitus, the surgeons were unable to mobilize her LIMA for grafting during her CABG operation. Continue Effient long-term since she is a poor Plavix responder, Coreg, ASA, ACEI, statin, Imdur.  She is unable to tolerate a higher Imdur dose due to dizziness. EF still preserved.  Needs aggressive management of risk factors. She continues to report some chest pain but it does not seem to be particularly exertional, seems more related to stress and anxiety.
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
## 2011-05-14 NOTE — Patient Instructions (Signed)
Your physician recommends that you have  lab work today--BMET/BNP/Lipid profile  Your physician recommends that you schedule a follow-up appointment in: 4 months with Dr Aundra Dubin.

## 2011-05-20 ENCOUNTER — Telehealth: Payer: Self-pay | Admitting: *Deleted

## 2011-05-20 DIAGNOSIS — I2510 Atherosclerotic heart disease of native coronary artery without angina pectoris: Secondary | ICD-10-CM

## 2011-05-20 DIAGNOSIS — E785 Hyperlipidemia, unspecified: Secondary | ICD-10-CM

## 2011-05-20 MED ORDER — FENOFIBRATE 145 MG PO TABS: 145.0000 mg | ORAL_TABLET | Freq: Every day | ORAL | Status: DC

## 2011-05-20 NOTE — Telephone Encounter (Signed)
Notes     Notes Recorded by Katrine Coho, RN on 05/20/2011 at 2:52 PM Discussed with pt. She will begin fenofibrate 145mg  daily. She will return for fasting lipid/liver profile 07/15/11. ------  Notes Recorded by Loralie Champagne, MD on 05/17/2011 at 10:07 PM Good LDL but triglycerides elevated. Would have her take fenofibrate 145 mg daily. Repeat lipids in 2 months.

## 2011-07-15 ENCOUNTER — Other Ambulatory Visit (INDEPENDENT_AMBULATORY_CARE_PROVIDER_SITE_OTHER): Payer: Medicare Other

## 2011-07-15 ENCOUNTER — Other Ambulatory Visit: Payer: Self-pay | Admitting: *Deleted

## 2011-07-15 DIAGNOSIS — I2510 Atherosclerotic heart disease of native coronary artery without angina pectoris: Secondary | ICD-10-CM

## 2011-07-15 DIAGNOSIS — E785 Hyperlipidemia, unspecified: Secondary | ICD-10-CM

## 2011-07-15 LAB — HEPATIC FUNCTION PANEL
ALT: 25 U/L (ref 0–35)
AST: 22 U/L (ref 0–37)
Alkaline Phosphatase: 64 U/L (ref 39–117)
Bilirubin, Direct: 0.1 mg/dL (ref 0.0–0.3)
Total Bilirubin: 0.2 mg/dL — ABNORMAL LOW (ref 0.3–1.2)

## 2011-07-15 LAB — LDL CHOLESTEROL, DIRECT: Direct LDL: 71.7 mg/dL

## 2011-07-15 LAB — LIPID PANEL: Triglycerides: 224 mg/dL — ABNORMAL HIGH (ref 0.0–149.0)

## 2011-07-15 MED ORDER — ROSUVASTATIN CALCIUM 5 MG PO TABS: 5.0000 mg | ORAL_TABLET | Freq: Every day | ORAL | Status: DC

## 2011-07-15 MED ORDER — PRASUGREL HCL 10 MG PO TABS: 10.0000 mg | ORAL_TABLET | Freq: Every day | ORAL | Status: DC

## 2011-07-30 ENCOUNTER — Telehealth: Payer: Self-pay | Admitting: Cardiology

## 2011-07-30 ENCOUNTER — Telehealth: Payer: Self-pay | Admitting: *Deleted

## 2011-07-30 NOTE — Telephone Encounter (Signed)
Patient returning nurse AL call, patient can be reached at [phone].

## 2011-07-30 NOTE — Telephone Encounter (Signed)
Spoke with pt about recent lab results 

## 2011-07-30 NOTE — Telephone Encounter (Signed)
Spoke with Jackson Latino at [phone]. Crestor authorized for pt through May 9,2014. [place] [facility] notified.

## 2011-08-20 ENCOUNTER — Other Ambulatory Visit: Payer: Self-pay | Admitting: Cardiology

## 2011-09-11 ENCOUNTER — Ambulatory Visit (INDEPENDENT_AMBULATORY_CARE_PROVIDER_SITE_OTHER): Payer: Medicare Other | Admitting: Cardiology

## 2011-09-11 ENCOUNTER — Encounter: Payer: Self-pay | Admitting: Cardiology

## 2011-09-11 VITALS — BP 118/68 | HR 77 | Ht 67.0 in | Wt 322.0 lb

## 2011-09-11 DIAGNOSIS — R0989 Other specified symptoms and signs involving the circulatory and respiratory systems: Secondary | ICD-10-CM

## 2011-09-11 DIAGNOSIS — R0683 Snoring: Secondary | ICD-10-CM

## 2011-09-11 DIAGNOSIS — R079 Chest pain, unspecified: Secondary | ICD-10-CM

## 2011-09-11 DIAGNOSIS — G4733 Obstructive sleep apnea (adult) (pediatric): Secondary | ICD-10-CM

## 2011-09-11 DIAGNOSIS — I5032 Chronic diastolic (congestive) heart failure: Secondary | ICD-10-CM

## 2011-09-11 DIAGNOSIS — E785 Hyperlipidemia, unspecified: Secondary | ICD-10-CM

## 2011-09-11 DIAGNOSIS — I25810 Atherosclerosis of coronary artery bypass graft(s) without angina pectoris: Secondary | ICD-10-CM

## 2011-09-11 DIAGNOSIS — E669 Obesity, unspecified: Secondary | ICD-10-CM

## 2011-09-11 DIAGNOSIS — I2510 Atherosclerotic heart disease of native coronary artery without angina pectoris: Secondary | ICD-10-CM

## 2011-09-11 LAB — BASIC METABOLIC PANEL
BUN: 21 mg/dL (ref 6–23)
CO2: 26 mEq/L (ref 19–32)
Calcium: 9.3 mg/dL (ref 8.4–10.5)
Chloride: 103 mEq/L (ref 96–112)
Creatinine, Ser: 1 mg/dL (ref 0.4–1.2)
Glucose, Bld: 181 mg/dL — ABNORMAL HIGH (ref 70–99)

## 2011-09-11 MED ORDER — PRASUGREL HCL 10 MG PO TABS: 10.0000 mg | ORAL_TABLET | Freq: Every day | ORAL | Status: DC

## 2011-09-11 NOTE — Patient Instructions (Addendum)
Your physician recommends that you have lab work today--BMET .  Your physician has recommended that you have a sleep study. This test records several body functions during sleep, including: brain activity, eye movement, oxygen and carbon dioxide blood levels, heart rate and rhythm, breathing rate and rhythm, the flow of air through your mouth and nose, snoring, body muscle movements, and chest and belly movement.  Your physician wants you to follow-up in: December 2013. You will receive a reminder letter in the mail two months in advance. If you don't receive a letter, please call our office to schedule the follow-up appointment.   Your physician recommends that you return for a FASTING lipid profile /BMET in December 2013 when you see Dr Aundra Dubin.

## 2011-09-12 DIAGNOSIS — G4733 Obstructive sleep apnea (adult) (pediatric): Secondary | ICD-10-CM | POA: Insufficient documentation

## 2011-09-12 DIAGNOSIS — E669 Obesity, unspecified: Secondary | ICD-10-CM | POA: Insufficient documentation

## 2011-09-12 NOTE — Assessment & Plan Note (Signed)
LDL at goal in 4/13.  Needs to work on triglycerides with diet.

## 2011-09-12 NOTE — Assessment & Plan Note (Signed)
Some weight loss but has a ways to go.  We talked again about diet and exercise.
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
## 2011-09-12 NOTE — Assessment & Plan Note (Signed)
I strongly suspect OSA.  Given her cardiac issues, I am going to go ahead and work this up with a sleep study.

## 2011-09-12 NOTE — Assessment & Plan Note (Signed)
Stable NYHA class II symptoms.  Continue current Lasix dose.  Volume status difficult given body habitus but does not appear significantly volume overloaded. Will check BMET today.

## 2011-09-12 NOTE — Assessment & Plan Note (Signed)
Very aggressive coronary disease.  Had 4 more drug-eluting stents placed in 12/11, 1 in the SVG-LAD and 3 to open the totally occluded native RCA after occlusion of the SVG-PDA. She was deemed not to be a candidate for re-do CABG this last admission by cardiac surgery. Unfortunately, due to her body habitus, the surgeons were unable to mobilize her LIMA for grafting during her initial CABG operation. Continue Effient long-term since she is a poor Plavix responder, Coreg, ASA, ACEI, statin, Imdur.  She is unable to tolerate a higher Imdur dose due to dizziness. EF preserved.  Needs aggressive management of risk factors. She continues to report some chest pain but it does not seem to be particularly exertional, seems more related to stress and anxiety. No workup for now unless pain pattern changes.

## 2011-09-19 ENCOUNTER — Other Ambulatory Visit: Payer: Self-pay | Admitting: Cardiology

## 2011-10-08 ENCOUNTER — Ambulatory Visit (HOSPITAL_BASED_OUTPATIENT_CLINIC_OR_DEPARTMENT_OTHER): Payer: BC Managed Care – PPO | Attending: Cardiology

## 2011-10-08 VITALS — Ht 67.0 in | Wt 327.0 lb

## 2011-10-08 DIAGNOSIS — R0683 Snoring: Secondary | ICD-10-CM

## 2011-10-08 DIAGNOSIS — G4733 Obstructive sleep apnea (adult) (pediatric): Secondary | ICD-10-CM | POA: Insufficient documentation

## 2011-10-19 ENCOUNTER — Other Ambulatory Visit: Payer: Self-pay | Admitting: Cardiology

## 2011-10-24 DIAGNOSIS — G4733 Obstructive sleep apnea (adult) (pediatric): Secondary | ICD-10-CM

## 2011-10-24 NOTE — Procedures (Signed)
[name], [name]              ACCOUNT NO.:  [id number]  MEDICAL RECORD NO.:  [id number]          PATIENT TYPE:  OUT  LOCATION:  SLEEP CENTER                 FACILITY:  [facility]  PHYSICIAN:  Kathee Delton, MD,FCCPDATE OF BIRTH:  1965-06-01  DATE OF STUDY:  10/08/2011                           NOCTURNAL POLYSOMNOGRAM  REFERRING PHYSICIAN:  Loralie Champagne, MD  LOCATION:  Sleep lab.  INDICATION FOR STUDY:  Hypersomnia with sleep apnea.  EPWORTH SLEEPINESS SCORE:  12.  MEDICATIONS:  SLEEP ARCHITECTURE:  The patient had a total sleep time of 234 minutes with no slow-wave sleep and only 31 minutes of REM.  Sleep onset latency was mildly prolonged at 35 minutes, and REM onset was mildly prolonged at 133 minutes.  Sleep efficiency was significantly reduced at 61%.  RESPIRATORY DATA:  The patient was found to have 2 apneas and 32 obstructive hypopneas, giving her an apnea-hypopnea index of only 9 events per hour.  The events were clearly increased during REM, and there was loud snoring noted throughout.  The patient did not meet split night protocol secondary to her small numbers of events.  OXYGEN DATA:  There was O2 desaturation as low as 82% with the patient's obstructive events.  CARDIAC DATA:  No clinically significant arrhythmias were noted.  MOVEMENT-PARASOMNIA:  The patient had no significant leg jerks or other abnormal behavior seen.  IMPRESSIONS-RECOMMENDATIONS:  Mild obstructive sleep apnea/hypopnea syndrome with an apnea-hypopnea index of only 9 events per hour, and oxygen desaturation transiently as low as 82%.  Treatment for this degree of sleep apnea can include a trial of weight loss alone, upper airway surgery, dental appliance, and also CPAP.  Because of its mild nature, it represents very little increased cardiovascular risk.  Therefore, the decision to treat this degree of sleep apnea should be based on its impact to the patient's quality  of life.     Kathee Delton, MD,FCCP [place], [place] Board of Sleep Medicine    [facility]/MEDQ  D:  10/24/2011 13:16:27  T:  10/24/2011 14:22:45  Job:  [id number]

## 2011-11-10 ENCOUNTER — Telehealth: Payer: Self-pay | Admitting: Cardiology

## 2011-11-10 NOTE — Telephone Encounter (Signed)
Attempted to call pt, but no answer--LM stating i looked at result,but hard to read--please call back later this week when dr Claris Gladden nurse is here for better explanation of results

## 2011-11-10 NOTE — Telephone Encounter (Signed)
Pt calling requesting results of sleep study done at [facility], pls call

## 2011-11-12 NOTE — Telephone Encounter (Signed)
Dr Aundra Dubin reviewed sleep study done 10/08/11. Mild OSA--weight loss. I spoke with pt. She is aware of Dr Claris Gladden recommendations.

## 2012-02-20 ENCOUNTER — Other Ambulatory Visit: Payer: Self-pay | Admitting: Cardiology

## 2012-02-27 ENCOUNTER — Other Ambulatory Visit: Payer: Self-pay | Admitting: Cardiology

## 2012-03-23 ENCOUNTER — Other Ambulatory Visit: Payer: Self-pay | Admitting: Cardiology

## 2012-03-31 ENCOUNTER — Other Ambulatory Visit: Payer: Self-pay | Admitting: Cardiology

## 2012-04-25 ENCOUNTER — Other Ambulatory Visit: Payer: Self-pay | Admitting: Cardiology

## 2012-04-26 NOTE — Telephone Encounter (Signed)
Fax Received. Refill Completed. [name] (R.M.A)   

## 2012-05-17 ENCOUNTER — Ambulatory Visit: Payer: BC Managed Care – PPO | Admitting: Cardiology

## 2012-05-30 ENCOUNTER — Other Ambulatory Visit: Payer: Self-pay | Admitting: Cardiology

## 2012-06-01 ENCOUNTER — Ambulatory Visit (INDEPENDENT_AMBULATORY_CARE_PROVIDER_SITE_OTHER): Payer: Medicare Other | Admitting: Cardiology

## 2012-06-01 ENCOUNTER — Encounter: Payer: Self-pay | Admitting: Cardiology

## 2012-06-01 VITALS — BP 124/70 | HR 47 | Ht 67.0 in | Wt 324.0 lb

## 2012-06-01 DIAGNOSIS — I5032 Chronic diastolic (congestive) heart failure: Secondary | ICD-10-CM

## 2012-06-01 DIAGNOSIS — R0602 Shortness of breath: Secondary | ICD-10-CM

## 2012-06-01 DIAGNOSIS — E785 Hyperlipidemia, unspecified: Secondary | ICD-10-CM

## 2012-06-01 DIAGNOSIS — I4892 Unspecified atrial flutter: Secondary | ICD-10-CM

## 2012-06-01 DIAGNOSIS — E669 Obesity, unspecified: Secondary | ICD-10-CM

## 2012-06-01 DIAGNOSIS — I25810 Atherosclerosis of coronary artery bypass graft(s) without angina pectoris: Secondary | ICD-10-CM

## 2012-06-01 LAB — LIPID PANEL
Cholesterol: 123 mg/dL (ref 0–200)
HDL: 26.5 mg/dL — ABNORMAL LOW (ref >39.00)
LDL Cholesterol: 57 mg/dL (ref 0–99)
VLDL: 39.8 mg/dL (ref 0.0–40.0)

## 2012-06-01 LAB — BASIC METABOLIC PANEL
BUN: 18 mg/dL (ref 6–23)
CO2: 28 mEq/L (ref 19–32)
Chloride: 104 mEq/L (ref 96–112)
Potassium: 3.7 mEq/L (ref 3.5–5.1)

## 2012-06-01 LAB — BRAIN NATRIURETIC PEPTIDE: Pro B Natriuretic peptide (BNP): 46 pg/mL (ref 0.0–100.0)
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
## 2012-06-01 NOTE — Patient Instructions (Signed)
Your physician recommends that you have lab work today for:  Lipids, BMET, BNP  Your physician has requested that you have a lexiscan myoview. For further information please visit [email or web address]. Please follow instruction sheet, as given.  Your physician recommends that you schedule a follow-up appointment in: 6 months with Dr. Aundra Dubin

## 2012-06-09 ENCOUNTER — Telehealth: Payer: Self-pay | Admitting: Cardiology

## 2012-06-09 NOTE — Telephone Encounter (Signed)
New problem     PCP wants to know can patient continue to take her gout medication.  Indocin 25 mg .

## 2012-06-09 NOTE — Telephone Encounter (Signed)
I would like her to avoid taking Indocin if possible.  If she needs it occasionally would be ok but do not take it regularly.

## 2012-06-09 NOTE — Telephone Encounter (Signed)
Pt.notified

## 2012-06-09 NOTE — Telephone Encounter (Signed)
Spoke with the patient and she stated she and Dr Aundra Dubin had discussed at a previous office visit how she should take her Indocin. She could not remember what exactly he said about how to take Indocin but he did not want for her to take it as prescribed 3 times a day secondary to her cardiac issues. Will forward to Dr Aundra Dubin for review

## 2012-06-14 ENCOUNTER — Ambulatory Visit (HOSPITAL_COMMUNITY): Payer: BC Managed Care – PPO | Attending: Cardiology | Admitting: Radiology

## 2012-06-14 VITALS — BP 130/62 | HR 77 | Ht 67.0 in | Wt 323.0 lb

## 2012-06-14 DIAGNOSIS — R110 Nausea: Secondary | ICD-10-CM | POA: Insufficient documentation

## 2012-06-14 DIAGNOSIS — R002 Palpitations: Secondary | ICD-10-CM | POA: Insufficient documentation

## 2012-06-14 DIAGNOSIS — I4892 Unspecified atrial flutter: Secondary | ICD-10-CM

## 2012-06-14 DIAGNOSIS — E119 Type 2 diabetes mellitus without complications: Secondary | ICD-10-CM

## 2012-06-14 DIAGNOSIS — I2510 Atherosclerotic heart disease of native coronary artery without angina pectoris: Secondary | ICD-10-CM

## 2012-06-14 DIAGNOSIS — R0602 Shortness of breath: Secondary | ICD-10-CM

## 2012-06-14 DIAGNOSIS — R5381 Other malaise: Secondary | ICD-10-CM | POA: Insufficient documentation

## 2012-06-14 DIAGNOSIS — I25810 Atherosclerosis of coronary artery bypass graft(s) without angina pectoris: Secondary | ICD-10-CM

## 2012-06-14 DIAGNOSIS — R079 Chest pain, unspecified: Secondary | ICD-10-CM | POA: Insufficient documentation

## 2012-06-14 DIAGNOSIS — R0609 Other forms of dyspnea: Secondary | ICD-10-CM | POA: Insufficient documentation

## 2012-06-14 DIAGNOSIS — R42 Dizziness and giddiness: Secondary | ICD-10-CM | POA: Insufficient documentation

## 2012-06-14 DIAGNOSIS — R0989 Other specified symptoms and signs involving the circulatory and respiratory systems: Secondary | ICD-10-CM | POA: Insufficient documentation

## 2012-06-14 MED ORDER — TECHNETIUM TC 99M SESTAMIBI GENERIC - CARDIOLITE
33.0000 | Freq: Once | INTRAVENOUS | Status: AC | PRN
  Administered 2012-06-14: 33 via INTRAVENOUS

## 2012-06-14 MED ORDER — REGADENOSON 0.4 MG/5ML IV SOLN
0.4000 mg | Freq: Once | INTRAVENOUS | Status: AC
  Administered 2012-06-14: 0.4 mg via INTRAVENOUS

## 2012-06-14 NOTE — Progress Notes (Addendum)
[facility] [address] [place], [place] [address] [phone]    Cardiology Nuclear Med Study  [name] is a 47 y.o. female     MRN : [id number]     DOB: [birth year]  Procedure Date: 06/14/2012  Nuclear Med Background Indication for Stress Test:  Evaluation for Ischemia, PTCA/Stent and Graft Patency History:  '10 CABG; 1/11 MI>PTCA/Stent-2 Grafts; 12/11 [id number] ischemia, EF=58%>Cath:Stent x 3-RCA and Stent>SVG-LAD; '11 Echo:EF=60% Cardiac Risk Factors: Family History - CAD, Hypertension, IDDM Type 2, Lipids and Obesity  Symptoms:  Chest Pain/Tightness with and without Exertion (last episode of chest discomfort was just a few minutes ago, 4-5/10; none now), Dizziness, DOE, Fatigue, Nausea and Palpitations   Nuclear Pre-Procedure Caffeine/Decaff Intake:  None NPO After: 12:30am   Lungs:  Clear. O2 Sat: 97% on room air. IV 0.9% NS with Angio Cath:  22g  IV Site: L Hand  IV Started by:  Matilde Haymaker, RN  Chest Size (in):  50 Cup Size: C  Height: 5\' 7"  (1.702 m)  Weight:  323 lb (146.512 kg)  BMI:  Body mass index is 50.58 kg/(m^2). Tech Comments:  Coreg taken at 12:30 am    Nuclear Med Study 1 or 2 day study: 2 day  Stress Test Type:  Carlton Adam  Reading MD: Mertie Moores, MD  Order Authorizing Provider:  Loralie Champagne, MD  Resting Radionuclide: Technetium 27m Sestamibi  Resting Radionuclide Dose: 33.0 mCi on 06/15/12   Stress Radionuclide:  Technetium 29m Sestamibi  Stress Radionuclide Dose: 33.0 mCi on 06/14/12           Stress Protocol Rest HR: 77 Stress HR: 98  Rest BP: 130/62 Stress BP: 141/69  Exercise Time (min): n/a METS: n/a   Predicted Max HR: 173 bpm % Max HR: 56.65 bpm Rate Pressure Product: 13818   Dose of Adenosine (mg):  n/a Dose of Lexiscan: 0.4 mg  Dose of Atropine (mg): n/a Dose of Dobutamine: n/a mcg/kg/min (at max HR)  Stress Test Technologist: Letta Moynahan, CMA-N  Nuclear Technologist:  Charlton Amor,  CNMT     Rest Procedure:  Myocardial perfusion imaging was performed at rest 45 minutes following the intravenous administration of Technetium 57m Sestamibi.  Rest ECG: The resting EKG is normal. There is normal sinus rhythm.  Stress Procedure:  The patient received IV Lexiscan 0.4 mg over 15-seconds.  She did c/o chest pressure with Lexiscan.  Technetium 21m Sestamibi injected at 30-seconds.  Quantitative spect images were obtained after a 45 minute delay.  Stress ECG: No significant ST segment change suggestive of ischemia.  QPS Raw Data Images:  Patient motion noted; appropriate software correction applied.  There is a very small spot of contaminant on the left side of the chest. This is of no significance Stress Images:  On the quantitative analysis there is no significant abnormality. However visually the septum is brighter than the lateral wall. There also appears to be mild photon reduction in a moderate area affecting the lateral wall. Rest Images:  The rest images are normal. Subtraction (SDS):  Quantitative analysis reveals no reversibility. Visually there is suggestion of reversibility in the lateral wall. Transient Ischemic Dilatation (Normal <1.22):  0.93 Lung/Heart Ratio (Normal <0.45):  0.35  Quantitative Gated Spect Images QGS EDV:  101 ml QGS ESV:  43 ml  Impression Exercise Capacity:  Lexiscan with no exercise. BP Response:  Normal blood pressure response. Clinical Symptoms:  chest heaviness ECG Impression:  No significant ST segment  change suggestive of ischemia. Comparison with Prior Nuclear Study: No images to compare  Overall Impression:  Using the quantitative analysis data there is no definite abnormality. There is no definite scar or ischemia. This is a low risk scan. I have described above that visually there is an impression that there may be some ischemia in the lateral wall. I cannot explain the difference between the quantitative analysis and the visual  analysis. It may be due to some other interference. The study should be treated as if there is no marked ischemia. If however there is a high clinical suspicion of ongoing issues, further evaluation might be warranted.  LV Ejection Fraction: 58%.  LV Wall Motion:  Mild relative hypokinesis of the inferior wall.  Dola Argyle, MD  Overall low risk, probably normal.  Loralie Champagne 06/16/2012         3

## 2012-06-15 ENCOUNTER — Ambulatory Visit (HOSPITAL_COMMUNITY): Payer: Medicare Other | Attending: Cardiology

## 2012-06-15 ENCOUNTER — Encounter (HOSPITAL_COMMUNITY): Payer: BC Managed Care – PPO

## 2012-06-15 DIAGNOSIS — R0989 Other specified symptoms and signs involving the circulatory and respiratory systems: Secondary | ICD-10-CM

## 2012-06-15 MED ORDER — TECHNETIUM TC 99M SESTAMIBI GENERIC - CARDIOLITE
33.0000 | Freq: Once | INTRAVENOUS | Status: AC | PRN
  Administered 2012-06-15: 33 via INTRAVENOUS

## 2012-06-17 ENCOUNTER — Telehealth: Payer: Self-pay | Admitting: Cardiology

## 2012-06-17 NOTE — Telephone Encounter (Signed)
Results and plan of care reviewed with patient.  Patient verbalized understanding.

## 2012-06-17 NOTE — Telephone Encounter (Signed)
New Prob   Pt calling to follow up of myocardial perfusion results. Would like to speak to nurse.

## 2012-06-17 NOTE — Telephone Encounter (Signed)
LMTCB

## 2012-06-22 NOTE — Progress Notes (Signed)
Pt notified 06/17/12

## 2012-06-27 DIAGNOSIS — Z9861 Coronary angioplasty status: Secondary | ICD-10-CM | POA: Insufficient documentation

## 2012-06-27 DIAGNOSIS — I219 Acute myocardial infarction, unspecified: Secondary | ICD-10-CM | POA: Insufficient documentation

## 2012-07-16 ENCOUNTER — Other Ambulatory Visit: Payer: Self-pay | Admitting: Cardiology

## 2012-09-19 ENCOUNTER — Other Ambulatory Visit: Payer: Self-pay | Admitting: Cardiology

## 2012-09-20 ENCOUNTER — Other Ambulatory Visit: Payer: Self-pay | Admitting: *Deleted

## 2012-09-20 MED ORDER — CARVEDILOL 25 MG PO TABS: ORAL_TABLET | ORAL | Status: DC

## 2012-09-26 ENCOUNTER — Other Ambulatory Visit: Payer: Self-pay | Admitting: Cardiology

## 2012-10-28 DIAGNOSIS — F341 Dysthymic disorder: Secondary | ICD-10-CM | POA: Insufficient documentation

## 2012-11-14 ENCOUNTER — Other Ambulatory Visit: Payer: Self-pay | Admitting: Cardiology

## 2012-12-19 ENCOUNTER — Other Ambulatory Visit: Payer: Self-pay | Admitting: Cardiology

## 2013-02-06 ENCOUNTER — Ambulatory Visit: Payer: BC Managed Care – PPO | Admitting: Cardiology

## 2013-02-07 ENCOUNTER — Encounter: Payer: Self-pay | Admitting: Cardiology

## 2013-02-07 ENCOUNTER — Ambulatory Visit (INDEPENDENT_AMBULATORY_CARE_PROVIDER_SITE_OTHER): Payer: BC Managed Care – PPO | Admitting: Cardiology

## 2013-02-07 VITALS — BP 116/72 | HR 87 | Ht 67.0 in | Wt 319.0 lb

## 2013-02-07 DIAGNOSIS — R0602 Shortness of breath: Secondary | ICD-10-CM

## 2013-02-07 DIAGNOSIS — I5032 Chronic diastolic (congestive) heart failure: Secondary | ICD-10-CM

## 2013-02-07 DIAGNOSIS — I25810 Atherosclerosis of coronary artery bypass graft(s) without angina pectoris: Secondary | ICD-10-CM

## 2013-02-07 DIAGNOSIS — E785 Hyperlipidemia, unspecified: Secondary | ICD-10-CM

## 2013-02-07 DIAGNOSIS — G4733 Obstructive sleep apnea (adult) (pediatric): Secondary | ICD-10-CM

## 2013-02-07 LAB — BASIC METABOLIC PANEL
CO2: 31 mEq/L (ref 19–32)
Chloride: 102 mEq/L (ref 96–112)
Sodium: 139 mEq/L (ref 135–145)

## 2013-02-07 LAB — LIPID PANEL: Total CHOL/HDL Ratio: 4

## 2013-02-07 LAB — BRAIN NATRIURETIC PEPTIDE: Pro B Natriuretic peptide (BNP): 54 pg/mL (ref 0.0–100.0)

## 2013-02-07 MED ORDER — TORSEMIDE 20 MG PO TABS: ORAL_TABLET | ORAL | Status: DC

## 2013-02-07 NOTE — Patient Instructions (Signed)
Stop lasix(furosemide).   Start torsemide (demadex) 80mg  daily. This will be 4 of a 20mg  tablet daily and the same time in the morning.   Your physician recommends that you have  lab work today--BMET/BNP/Lipid profile.  Your physician recommends that you return for lab work in: 33 days--BMET/BNP. I have given you an order for this. Please fax the results to Dr Aundra Dubin [phone].  Your physician recommends that you schedule a follow-up appointment in: 1 month with Dr Aundra Dubin.
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
## 2013-02-09 ENCOUNTER — Other Ambulatory Visit: Payer: Self-pay | Admitting: Cardiology

## 2013-03-07 ENCOUNTER — Telehealth: Payer: Self-pay | Admitting: Cardiology

## 2013-03-07 DIAGNOSIS — I509 Heart failure, unspecified: Secondary | ICD-10-CM

## 2013-03-07 DIAGNOSIS — R0602 Shortness of breath: Secondary | ICD-10-CM

## 2013-03-07 NOTE — Telephone Encounter (Signed)
Left message for patient to call back  

## 2013-03-07 NOTE — Telephone Encounter (Signed)
New Problem  Pt called states she was placed on a new medication that required a lab within 10 days with her PCP// She states that it has been 2 weeks since the labs were ordered and she has a f/up appt on 12/18.. Will she need a lab before the appt. If so, Pt says she lives an hour away and would not be able to come the day before for labs.

## 2013-03-07 NOTE — Telephone Encounter (Signed)
Called stating she didn't get her lab done 2 weeks after her last OV here because she "forgot".  She is scheduled to see Dr. Lenise Herald 12/18 and wants to know if can have lab done.  States she lives in [place] and doesn't want to make another trip before [place].  Advised to come earlier and go to lab before seeing Dr. Aundra Dubin. She is agreeable to that and will come early. Will put order in for BMET/BNP.

## 2013-03-08 ENCOUNTER — Other Ambulatory Visit: Payer: Self-pay | Admitting: Cardiology

## 2013-03-09 ENCOUNTER — Encounter: Payer: Self-pay | Admitting: Cardiology

## 2013-03-09 ENCOUNTER — Ambulatory Visit (INDEPENDENT_AMBULATORY_CARE_PROVIDER_SITE_OTHER): Payer: BC Managed Care – PPO | Admitting: Cardiology

## 2013-03-09 ENCOUNTER — Other Ambulatory Visit (INDEPENDENT_AMBULATORY_CARE_PROVIDER_SITE_OTHER): Payer: BC Managed Care – PPO

## 2013-03-09 VITALS — BP 122/58 | HR 88 | Ht 67.0 in | Wt 318.0 lb

## 2013-03-09 DIAGNOSIS — I509 Heart failure, unspecified: Secondary | ICD-10-CM

## 2013-03-09 DIAGNOSIS — R0602 Shortness of breath: Secondary | ICD-10-CM

## 2013-03-09 DIAGNOSIS — E785 Hyperlipidemia, unspecified: Secondary | ICD-10-CM

## 2013-03-09 DIAGNOSIS — E669 Obesity, unspecified: Secondary | ICD-10-CM

## 2013-03-09 DIAGNOSIS — I25810 Atherosclerosis of coronary artery bypass graft(s) without angina pectoris: Secondary | ICD-10-CM

## 2013-03-09 DIAGNOSIS — I5032 Chronic diastolic (congestive) heart failure: Secondary | ICD-10-CM

## 2013-03-09 LAB — BASIC METABOLIC PANEL
Chloride: 100 mEq/L (ref 96–112)
GFR: 53.72 mL/min — ABNORMAL LOW (ref >60.00)
Potassium: 4.2 mEq/L (ref 3.5–5.1)
Sodium: 138 mEq/L (ref 135–145)

## 2013-03-09 LAB — BRAIN NATRIURETIC PEPTIDE: Pro B Natriuretic peptide (BNP): 52 pg/mL (ref 0.0–100.0)

## 2013-03-09 NOTE — Patient Instructions (Signed)
Your physician recommends that you schedule a follow-up appointment in: 3 months with Dr Aundra Dubin in the [facility] at  [facility].
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
## 2013-04-07 ENCOUNTER — Other Ambulatory Visit: Payer: Self-pay | Admitting: Cardiology

## 2013-05-08 ENCOUNTER — Other Ambulatory Visit: Payer: Self-pay | Admitting: Cardiology

## 2013-05-08 DIAGNOSIS — Z79899 Other long term (current) drug therapy: Secondary | ICD-10-CM | POA: Insufficient documentation

## 2013-06-13 ENCOUNTER — Telehealth: Payer: Self-pay | Admitting: *Deleted

## 2013-06-13 NOTE — Telephone Encounter (Signed)
Dr Aundra Dubin received a copy BMET done 05/08/13 at [place], BUN 38/Cr 1.79. Dr Aundra Dubin recommended repeat BMET and appt in CHF clinic.   Phone number [phone] message received person called is unavailable right now, alternate number listed [phone] disconnected.

## 2013-06-15 ENCOUNTER — Encounter: Payer: Self-pay | Admitting: *Deleted

## 2013-06-15 NOTE — Telephone Encounter (Signed)
I have made numerous attempts to contact pt by telephone. I called [facility], [phone], and they did not have any other phone numbers for pt. I mailed pt a letter,with order for BMET included in letter, asking pt to have this rechecked and to contact our office to give [place] a current/alternate phone number.

## 2013-06-22 ENCOUNTER — Telehealth: Payer: Self-pay | Admitting: Cardiology

## 2013-06-22 NOTE — Telephone Encounter (Deleted)
Error

## 2013-06-27 ENCOUNTER — Encounter: Payer: Self-pay | Admitting: Cardiology

## 2013-06-29 ENCOUNTER — Encounter (HOSPITAL_COMMUNITY): Payer: BC Managed Care – PPO

## 2013-07-03 ENCOUNTER — Ambulatory Visit (HOSPITAL_COMMUNITY)
Admission: RE | Admit: 2013-07-03 | Discharge: 2013-07-03 | Disposition: A | Discharge status: Home / Self Care | Payer: BC Managed Care – PPO | Source: Ambulatory Visit | Attending: Internal Medicine | Admitting: Internal Medicine

## 2013-07-03 ENCOUNTER — Encounter (HOSPITAL_COMMUNITY): Payer: Self-pay

## 2013-07-03 VITALS — BP 122/56 | HR 114 | Ht 67.0 in | Wt 317.0 lb

## 2013-07-03 DIAGNOSIS — I5032 Chronic diastolic (congestive) heart failure: Secondary | ICD-10-CM | POA: Diagnosis present

## 2013-07-03 DIAGNOSIS — I25810 Atherosclerosis of coronary artery bypass graft(s) without angina pectoris: Secondary | ICD-10-CM

## 2013-07-03 DIAGNOSIS — I2510 Atherosclerotic heart disease of native coronary artery without angina pectoris: Secondary | ICD-10-CM | POA: Insufficient documentation
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
## 2013-07-03 NOTE — Patient Instructions (Addendum)
Your physician recommends that you return for a FASTING lipid profile: at PCP office  We will contact you in 6 months to schedule your next appointment with Dr Aundra Dubin

## 2013-07-03 NOTE — Addendum Note (Signed)
Encounter addended by: Scarlette Calico, RN on: 07/03/2013  4:09 PM<BR>     Documentation filed: Patient Instructions Section

## 2013-07-06 ENCOUNTER — Other Ambulatory Visit: Payer: Self-pay | Admitting: Cardiology

## 2013-07-20 ENCOUNTER — Other Ambulatory Visit: Payer: Self-pay | Admitting: Cardiology

## 2013-07-26 DIAGNOSIS — N179 Acute kidney failure, unspecified: Secondary | ICD-10-CM | POA: Insufficient documentation

## 2013-07-26 DIAGNOSIS — R1110 Vomiting, unspecified: Secondary | ICD-10-CM | POA: Insufficient documentation

## 2013-07-26 DIAGNOSIS — N183 Chronic kidney disease, stage 3 unspecified: Secondary | ICD-10-CM | POA: Insufficient documentation

## 2013-07-31 DIAGNOSIS — K529 Noninfective gastroenteritis and colitis, unspecified: Secondary | ICD-10-CM | POA: Insufficient documentation

## 2013-07-31 DIAGNOSIS — M109 Gout, unspecified: Secondary | ICD-10-CM | POA: Insufficient documentation

## 2013-07-31 DIAGNOSIS — E860 Dehydration: Secondary | ICD-10-CM | POA: Insufficient documentation

## 2013-08-10 ENCOUNTER — Other Ambulatory Visit: Payer: Self-pay | Admitting: Cardiology

## 2013-08-25 DIAGNOSIS — M549 Dorsalgia, unspecified: Secondary | ICD-10-CM | POA: Insufficient documentation

## 2013-08-25 DIAGNOSIS — G8929 Other chronic pain: Secondary | ICD-10-CM | POA: Insufficient documentation

## 2013-11-10 ENCOUNTER — Other Ambulatory Visit: Payer: Self-pay | Admitting: Cardiology

## 2013-12-15 ENCOUNTER — Other Ambulatory Visit: Payer: Self-pay | Admitting: Cardiology

## 2014-01-02 NOTE — Telephone Encounter (Signed)
error 

## 2014-01-19 ENCOUNTER — Other Ambulatory Visit: Payer: Self-pay | Admitting: Cardiology

## 2014-01-19 DIAGNOSIS — I5022 Chronic systolic (congestive) heart failure: Secondary | ICD-10-CM

## 2014-02-09 ENCOUNTER — Other Ambulatory Visit: Payer: Self-pay | Admitting: Cardiology

## 2014-02-09 DIAGNOSIS — I5022 Chronic systolic (congestive) heart failure: Secondary | ICD-10-CM

## 2014-03-05 ENCOUNTER — Other Ambulatory Visit: Payer: Self-pay | Admitting: Internal Medicine

## 2014-06-08 ENCOUNTER — Other Ambulatory Visit (HOSPITAL_COMMUNITY): Payer: Self-pay | Admitting: *Deleted

## 2014-06-08 MED ORDER — PRASUGREL HCL 10 MG PO TABS: ORAL_TABLET | ORAL | Status: DC

## 2014-06-08 MED ORDER — ROSUVASTATIN CALCIUM 5 MG PO TABS: ORAL_TABLET | ORAL | Status: AC

## 2014-06-08 MED ORDER — CARVEDILOL 25 MG PO TABS: ORAL_TABLET | ORAL | Status: DC

## 2014-06-21 ENCOUNTER — Telehealth (HOSPITAL_COMMUNITY): Payer: Self-pay | Admitting: Vascular Surgery

## 2014-06-21 NOTE — Telephone Encounter (Signed)
Pt called she needs a letter from [place] stating her health issues for her lawyer. Pt will be here on Monday 3:20 she would like to get the letter then.. Please advise

## 2014-06-25 ENCOUNTER — Encounter (HOSPITAL_COMMUNITY): Payer: Self-pay

## 2014-06-25 ENCOUNTER — Ambulatory Visit (HOSPITAL_COMMUNITY)
Admission: RE | Admit: 2014-06-25 | Discharge: 2014-06-25 | Disposition: A | Discharge status: Home / Self Care | Payer: Medicare Other | Source: Ambulatory Visit | Attending: Cardiology | Admitting: Cardiology

## 2014-06-25 VITALS — BP 128/72 | HR 94 | Wt 320.5 lb

## 2014-06-25 DIAGNOSIS — I25709 Atherosclerosis of coronary artery bypass graft(s), unspecified, with unspecified angina pectoris: Secondary | ICD-10-CM | POA: Diagnosis not present

## 2014-06-25 DIAGNOSIS — E785 Hyperlipidemia, unspecified: Secondary | ICD-10-CM | POA: Insufficient documentation

## 2014-06-25 DIAGNOSIS — Z951 Presence of aortocoronary bypass graft: Secondary | ICD-10-CM | POA: Diagnosis not present

## 2014-06-25 DIAGNOSIS — I252 Old myocardial infarction: Secondary | ICD-10-CM | POA: Diagnosis not present

## 2014-06-25 DIAGNOSIS — F329 Major depressive disorder, single episode, unspecified: Secondary | ICD-10-CM | POA: Diagnosis not present

## 2014-06-25 DIAGNOSIS — E119 Type 2 diabetes mellitus without complications: Secondary | ICD-10-CM | POA: Diagnosis not present

## 2014-06-25 DIAGNOSIS — I2510 Atherosclerotic heart disease of native coronary artery without angina pectoris: Secondary | ICD-10-CM | POA: Insufficient documentation

## 2014-06-25 DIAGNOSIS — Z794 Long term (current) use of insulin: Secondary | ICD-10-CM | POA: Insufficient documentation

## 2014-06-25 DIAGNOSIS — I10 Essential (primary) hypertension: Secondary | ICD-10-CM | POA: Insufficient documentation

## 2014-06-25 DIAGNOSIS — I5032 Chronic diastolic (congestive) heart failure: Secondary | ICD-10-CM | POA: Diagnosis not present

## 2014-06-25 DIAGNOSIS — I5022 Chronic systolic (congestive) heart failure: Secondary | ICD-10-CM | POA: Diagnosis not present

## 2014-06-25 DIAGNOSIS — I2582 Chronic total occlusion of coronary artery: Secondary | ICD-10-CM | POA: Diagnosis not present

## 2014-06-25 DIAGNOSIS — Z7982 Long term (current) use of aspirin: Secondary | ICD-10-CM | POA: Diagnosis not present

## 2014-06-25 LAB — LIPID PANEL
Cholesterol: 116 mg/dL (ref 0–200)
HDL: 26 mg/dL — AB (ref >39)
LDL CALC: 46 mg/dL (ref 0–99)
Total CHOL/HDL Ratio: 4.5 RATIO
Triglycerides: 220 mg/dL — ABNORMAL HIGH (ref <150)
VLDL: 44 mg/dL — AB (ref 0–40)

## 2014-06-25 LAB — BASIC METABOLIC PANEL
Anion gap: 11 (ref 5–15)
BUN: 35 mg/dL — AB (ref 6–23)
CALCIUM: 9.8 mg/dL (ref 8.4–10.5)
CO2: 26 mmol/L (ref 19–32)
CREATININE: 1.63 mg/dL — AB (ref 0.50–1.10)
Chloride: 99 mmol/L (ref 96–112)
GFR, EST AFRICAN AMERICAN: 42 mL/min — AB (ref >90)
GFR, EST NON AFRICAN AMERICAN: 36 mL/min — AB (ref >90)
Glucose, Bld: 350 mg/dL — ABNORMAL HIGH (ref 70–99)
Potassium: 4.3 mmol/L (ref 3.5–5.1)
Sodium: 136 mmol/L (ref 135–145)

## 2014-06-25 LAB — CBC
HCT: 30.2 % — ABNORMAL LOW (ref 36.0–46.0)
Hemoglobin: 10.1 g/dL — ABNORMAL LOW (ref 12.0–15.0)
MCH: 27.3 pg (ref 26.0–34.0)
MCHC: 33.4 g/dL (ref 30.0–36.0)
MCV: 81.6 fL (ref 78.0–100.0)
Platelets: 211 10*3/uL (ref 150–400)
RBC: 3.7 MIL/uL — ABNORMAL LOW (ref 3.87–5.11)
RDW: 12.8 % (ref 11.5–15.5)
WBC: 7.4 10*3/uL (ref 4.0–10.5)

## 2014-06-25 MED ORDER — TORSEMIDE 20 MG PO TABS: 40.0000 mg | ORAL_TABLET | Freq: Every day | ORAL | Status: DC

## 2014-06-25 MED ORDER — RANOLAZINE ER 500 MG PO TB12: 500.0000 mg | ORAL_TABLET | Freq: Two times a day (BID) | ORAL | Status: DC

## 2014-06-25 NOTE — Telephone Encounter (Signed)
WILL REVIEW WITH PROVIDER

## 2014-06-25 NOTE — Patient Instructions (Signed)
CONTINUE Torsemide 40mg  (2 tablets) once daily.  START Ranexa 500mg  tablet twice daily.  Follow up 2 months.  Do the following things EVERYDAY: 1) Weigh yourself in the morning before breakfast. Write it down and keep it in a log. 2) Take your medicines as prescribed 3) Eat low salt foods-Limit salt (sodium) to 2000 mg per day.  4) Stay as active as you can everyday 5) Limit all fluids for the day to less than 2 liters

## 2014-06-25 NOTE — Telephone Encounter (Signed)
Addressed at office visit 06/25/14
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
## 2014-06-26 NOTE — Addendum Note (Signed)
Encounter addended by: Melissa Montane, NT on: 06/26/2014  8:52 AM<BR>     Documentation filed: Charges VN

## 2014-06-29 ENCOUNTER — Telehealth (HOSPITAL_COMMUNITY): Payer: Self-pay | Admitting: *Deleted

## 2014-06-29 DIAGNOSIS — I5022 Chronic systolic (congestive) heart failure: Secondary | ICD-10-CM

## 2014-06-29 MED ORDER — TORSEMIDE 20 MG PO TABS: ORAL_TABLET | ORAL | Status: DC

## 2014-06-29 NOTE — Telephone Encounter (Signed)
Pt aware, lab order faxed to PCP at [phone]

## 2014-06-29 NOTE — Telephone Encounter (Signed)
-----   Message from Larey Dresser, MD sent at 06/27/2014  5:06 PM EDT ----- Creatinine a little higher than a year ago. Would change torsemide to 40 daily alternating with 20 daily.  Repeat BMET in 2 wks. LDL ok.  High TGs, work on diet.

## 2014-07-13 ENCOUNTER — Telehealth (HOSPITAL_COMMUNITY): Payer: Self-pay | Admitting: Cardiology

## 2014-07-13 DIAGNOSIS — I5022 Chronic systolic (congestive) heart failure: Secondary | ICD-10-CM

## 2014-07-13 MED ORDER — TORSEMIDE 20 MG PO TABS: 40.0000 mg | ORAL_TABLET | Freq: Every day | ORAL | Status: DC

## 2014-07-13 NOTE — Telephone Encounter (Signed)
Pt called to report she feels "puffy" since diuretic was decreased to alternating doses of 40/20 (Cr slightly elevated 1.63) Pt reports weight at home 326lb, last office visit 320 lb  Pt scheduled to have labs on 4/25  Per Dr. Aundra Dubin ok to return to previous dose of torsemide 40 mg daily and be sure to have labs recheck on 4/25

## 2014-07-13 NOTE — Addendum Note (Signed)
Addended by: Kerry Dory on: 07/13/2014 03:09 PM   Modules accepted: Orders

## 2014-08-22 ENCOUNTER — Telehealth (HOSPITAL_COMMUNITY): Payer: Self-pay | Admitting: Cardiology

## 2014-08-22 ENCOUNTER — Encounter (HOSPITAL_COMMUNITY): Payer: Self-pay

## 2014-08-22 ENCOUNTER — Ambulatory Visit (HOSPITAL_COMMUNITY)
Admission: RE | Admit: 2014-08-22 | Discharge: 2014-08-22 | Disposition: A | Discharge status: Home / Self Care | Payer: Medicare Other | Source: Ambulatory Visit | Attending: Cardiology | Admitting: Cardiology

## 2014-08-22 VITALS — BP 123/61 | HR 96 | Resp 18 | Wt 320.8 lb

## 2014-08-22 DIAGNOSIS — Z951 Presence of aortocoronary bypass graft: Secondary | ICD-10-CM | POA: Diagnosis not present

## 2014-08-22 DIAGNOSIS — Z794 Long term (current) use of insulin: Secondary | ICD-10-CM | POA: Diagnosis not present

## 2014-08-22 DIAGNOSIS — Z7982 Long term (current) use of aspirin: Secondary | ICD-10-CM | POA: Diagnosis not present

## 2014-08-22 DIAGNOSIS — N189 Chronic kidney disease, unspecified: Secondary | ICD-10-CM | POA: Diagnosis not present

## 2014-08-22 DIAGNOSIS — E669 Obesity, unspecified: Secondary | ICD-10-CM | POA: Diagnosis not present

## 2014-08-22 DIAGNOSIS — M109 Gout, unspecified: Secondary | ICD-10-CM | POA: Insufficient documentation

## 2014-08-22 DIAGNOSIS — F329 Major depressive disorder, single episode, unspecified: Secondary | ICD-10-CM | POA: Diagnosis not present

## 2014-08-22 DIAGNOSIS — I25119 Atherosclerotic heart disease of native coronary artery with unspecified angina pectoris: Secondary | ICD-10-CM | POA: Diagnosis not present

## 2014-08-22 DIAGNOSIS — I129 Hypertensive chronic kidney disease with stage 1 through stage 4 chronic kidney disease, or unspecified chronic kidney disease: Secondary | ICD-10-CM | POA: Diagnosis not present

## 2014-08-22 DIAGNOSIS — E119 Type 2 diabetes mellitus without complications: Secondary | ICD-10-CM | POA: Insufficient documentation

## 2014-08-22 DIAGNOSIS — G4733 Obstructive sleep apnea (adult) (pediatric): Secondary | ICD-10-CM | POA: Diagnosis not present

## 2014-08-22 DIAGNOSIS — E785 Hyperlipidemia, unspecified: Secondary | ICD-10-CM | POA: Insufficient documentation

## 2014-08-22 DIAGNOSIS — I5032 Chronic diastolic (congestive) heart failure: Secondary | ICD-10-CM | POA: Diagnosis present

## 2014-08-22 DIAGNOSIS — I25708 Atherosclerosis of coronary artery bypass graft(s), unspecified, with other forms of angina pectoris: Secondary | ICD-10-CM | POA: Diagnosis not present

## 2014-08-22 DIAGNOSIS — I5022 Chronic systolic (congestive) heart failure: Secondary | ICD-10-CM

## 2014-08-22 DIAGNOSIS — Z79899 Other long term (current) drug therapy: Secondary | ICD-10-CM | POA: Diagnosis not present

## 2014-08-22 LAB — BASIC METABOLIC PANEL
ANION GAP: 10 (ref 5–15)
BUN: 37 mg/dL — ABNORMAL HIGH (ref 6–20)
CALCIUM: 9.5 mg/dL (ref 8.9–10.3)
CO2: 28 mmol/L (ref 22–32)
Chloride: 101 mmol/L (ref 101–111)
Creatinine, Ser: 1.96 mg/dL — ABNORMAL HIGH (ref 0.44–1.00)
GFR, EST AFRICAN AMERICAN: 33 mL/min — AB (ref >60)
GFR, EST NON AFRICAN AMERICAN: 29 mL/min — AB (ref >60)
Glucose, Bld: 281 mg/dL — ABNORMAL HIGH (ref 65–99)
Potassium: 4.7 mmol/L (ref 3.5–5.1)
Sodium: 139 mmol/L (ref 135–145)

## 2014-08-22 LAB — BRAIN NATRIURETIC PEPTIDE: B Natriuretic Peptide: 44.6 pg/mL (ref 0.0–100.0)

## 2014-08-22 MED ORDER — TORSEMIDE 20 MG PO TABS: 40.0000 mg | ORAL_TABLET | Freq: Every day | ORAL | Status: DC

## 2014-08-22 NOTE — Patient Instructions (Signed)
Labs today  We will contact you in 4 months to schedule your next appointment.  

## 2014-08-22 NOTE — Telephone Encounter (Signed)
Shortly after pts appointment pt requested a letter to be written by physician regarding her not to stay home alone Pt states this is something that was discussed at office visit and pt would like to have this documented  Advised pt provider is still seeing patients, will leave message for provider and we will notify pt when letter is complete Pt voiced understanding, and requested letter to be mailed to her

## 2014-08-22 NOTE — Telephone Encounter (Signed)
We can provide her with this letter.

## 2014-08-23 ENCOUNTER — Other Ambulatory Visit (HOSPITAL_COMMUNITY): Payer: Self-pay | Admitting: *Deleted

## 2014-08-23 DIAGNOSIS — I5022 Chronic systolic (congestive) heart failure: Secondary | ICD-10-CM
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
## 2014-08-24 ENCOUNTER — Encounter (HOSPITAL_COMMUNITY): Payer: Self-pay | Admitting: *Deleted

## 2014-08-28 NOTE — Telephone Encounter (Signed)
Pt aware letter completed and mailed to her per her request

## 2014-09-01 ENCOUNTER — Other Ambulatory Visit (HOSPITAL_COMMUNITY): Payer: Self-pay | Admitting: Internal Medicine

## 2014-10-02 ENCOUNTER — Other Ambulatory Visit (HOSPITAL_COMMUNITY): Payer: Self-pay | Admitting: Cardiology

## 2014-12-01 ENCOUNTER — Other Ambulatory Visit (HOSPITAL_COMMUNITY): Payer: Self-pay | Admitting: Internal Medicine

## 2015-01-07 ENCOUNTER — Other Ambulatory Visit (HOSPITAL_COMMUNITY): Payer: Self-pay | Admitting: Cardiology

## 2015-04-02 ENCOUNTER — Other Ambulatory Visit (HOSPITAL_COMMUNITY): Payer: Self-pay | Admitting: Internal Medicine

## 2015-04-29 ENCOUNTER — Other Ambulatory Visit (HOSPITAL_COMMUNITY): Payer: Self-pay | Admitting: Cardiology

## 2015-06-21 ENCOUNTER — Other Ambulatory Visit (HOSPITAL_COMMUNITY): Payer: Self-pay | Admitting: Internal Medicine

## 2015-08-23 ENCOUNTER — Other Ambulatory Visit (HOSPITAL_COMMUNITY): Payer: Self-pay | Admitting: Cardiology

## 2015-08-23 NOTE — Telephone Encounter (Signed)
Please advise 

## 2015-12-16 ENCOUNTER — Other Ambulatory Visit (HOSPITAL_COMMUNITY): Payer: Self-pay | Admitting: Cardiology

## 2016-01-07 DIAGNOSIS — Z79899 Other long term (current) drug therapy: Secondary | ICD-10-CM | POA: Insufficient documentation

## 2016-01-13 ENCOUNTER — Other Ambulatory Visit (HOSPITAL_COMMUNITY): Payer: Self-pay | Admitting: Internal Medicine

## 2016-04-03 ENCOUNTER — Other Ambulatory Visit (HOSPITAL_COMMUNITY): Payer: Self-pay | Admitting: Cardiology

## 2016-05-01 ENCOUNTER — Other Ambulatory Visit (HOSPITAL_COMMUNITY): Payer: Self-pay | Admitting: Internal Medicine

## 2016-05-20 DIAGNOSIS — R197 Diarrhea, unspecified: Secondary | ICD-10-CM | POA: Insufficient documentation

## 2016-07-25 ENCOUNTER — Other Ambulatory Visit (HOSPITAL_COMMUNITY): Payer: Self-pay | Admitting: Cardiology

## 2016-07-27 ENCOUNTER — Telehealth (HOSPITAL_COMMUNITY): Payer: Self-pay | Admitting: Vascular Surgery

## 2016-07-27 ENCOUNTER — Other Ambulatory Visit (HOSPITAL_COMMUNITY): Payer: Self-pay | Admitting: Pharmacist

## 2016-07-27 MED ORDER — PRASUGREL HCL 10 MG PO TABS: 10.0000 mg | ORAL_TABLET | Freq: Every day | ORAL | 3 refills | Status: DC

## 2016-07-27 NOTE — Telephone Encounter (Signed)
Left pt message to make f/u appt w/ [facility] 

## 2016-07-31 ENCOUNTER — Encounter (HOSPITAL_COMMUNITY): Payer: Self-pay | Admitting: Pharmacist

## 2016-08-03 ENCOUNTER — Telehealth (HOSPITAL_COMMUNITY): Payer: Self-pay | Admitting: Pharmacist

## 2016-08-03 NOTE — Telephone Encounter (Signed)
[name] states she cannot tolerate generic Effient (prasugrel) 2/2 nausea. Her appeal for brand name Effient was approved by [employer] through 03/22/17.   Ruta Hinds. Velva Harman, PharmD, BCPS, CPP Clinical Pharmacist Pager: [phone] Phone: [phone] 08/03/2016 10:21 AM

## 2016-09-14 ENCOUNTER — Telehealth (HOSPITAL_COMMUNITY): Payer: Self-pay | Admitting: Vascular Surgery

## 2016-09-14 ENCOUNTER — Ambulatory Visit (HOSPITAL_COMMUNITY)
Admission: RE | Admit: 2016-09-14 | Discharge: 2016-09-14 | Disposition: A | Discharge status: Home / Self Care | Payer: Medicare Other | Source: Ambulatory Visit | Attending: Cardiology | Admitting: Cardiology

## 2016-09-14 VITALS — Wt 322.4 lb

## 2016-09-14 DIAGNOSIS — Z951 Presence of aortocoronary bypass graft: Secondary | ICD-10-CM | POA: Insufficient documentation

## 2016-09-14 DIAGNOSIS — I5032 Chronic diastolic (congestive) heart failure: Secondary | ICD-10-CM | POA: Diagnosis not present

## 2016-09-14 DIAGNOSIS — N183 Chronic kidney disease, stage 3 (moderate): Secondary | ICD-10-CM | POA: Diagnosis not present

## 2016-09-14 DIAGNOSIS — I130 Hypertensive heart and chronic kidney disease with heart failure and stage 1 through stage 4 chronic kidney disease, or unspecified chronic kidney disease: Secondary | ICD-10-CM | POA: Diagnosis not present

## 2016-09-14 DIAGNOSIS — I25119 Atherosclerotic heart disease of native coronary artery with unspecified angina pectoris: Secondary | ICD-10-CM | POA: Insufficient documentation

## 2016-09-14 DIAGNOSIS — I4510 Unspecified right bundle-branch block: Secondary | ICD-10-CM | POA: Diagnosis not present

## 2016-09-14 DIAGNOSIS — Z8249 Family history of ischemic heart disease and other diseases of the circulatory system: Secondary | ICD-10-CM | POA: Insufficient documentation

## 2016-09-14 DIAGNOSIS — E1122 Type 2 diabetes mellitus with diabetic chronic kidney disease: Secondary | ICD-10-CM | POA: Insufficient documentation

## 2016-09-14 DIAGNOSIS — I252 Old myocardial infarction: Secondary | ICD-10-CM | POA: Insufficient documentation

## 2016-09-14 DIAGNOSIS — I872 Venous insufficiency (chronic) (peripheral): Secondary | ICD-10-CM

## 2016-09-14 DIAGNOSIS — E7849 Other hyperlipidemia: Secondary | ICD-10-CM

## 2016-09-14 DIAGNOSIS — I208 Other forms of angina pectoris: Secondary | ICD-10-CM

## 2016-09-14 DIAGNOSIS — R002 Palpitations: Secondary | ICD-10-CM | POA: Insufficient documentation

## 2016-09-14 DIAGNOSIS — Z9851 Tubal ligation status: Secondary | ICD-10-CM | POA: Diagnosis not present

## 2016-09-14 DIAGNOSIS — L97509 Non-pressure chronic ulcer of other part of unspecified foot with unspecified severity: Secondary | ICD-10-CM

## 2016-09-14 DIAGNOSIS — M109 Gout, unspecified: Secondary | ICD-10-CM | POA: Insufficient documentation

## 2016-09-14 LAB — CBC
HCT: 33.1 % — ABNORMAL LOW (ref 36.0–46.0)
Hemoglobin: 10.8 g/dL — ABNORMAL LOW (ref 12.0–15.0)
MCH: 27.1 pg (ref 26.0–34.0)
MCHC: 32.6 g/dL (ref 30.0–36.0)
MCV: 83.2 fL (ref 78.0–100.0)
Platelets: 212 10*3/uL (ref 150–400)
RBC: 3.98 MIL/uL (ref 3.87–5.11)
RDW: 13.5 % (ref 11.5–15.5)
WBC: 7.3 10*3/uL (ref 4.0–10.5)

## 2016-09-14 LAB — BASIC METABOLIC PANEL
ANION GAP: 7 (ref 5–15)
BUN: 27 mg/dL — ABNORMAL HIGH (ref 6–20)
CO2: 28 mmol/L (ref 22–32)
Calcium: 9.2 mg/dL (ref 8.9–10.3)
Chloride: 104 mmol/L (ref 101–111)
Creatinine, Ser: 1.8 mg/dL — ABNORMAL HIGH (ref 0.44–1.00)
GFR calc non Af Amer: 31 mL/min — ABNORMAL LOW (ref >60)
GFR, EST AFRICAN AMERICAN: 36 mL/min — AB (ref >60)
GLUCOSE: 224 mg/dL — AB (ref 65–99)
POTASSIUM: 3.9 mmol/L (ref 3.5–5.1)
Sodium: 139 mmol/L (ref 135–145)

## 2016-09-14 LAB — LIPID PANEL
CHOL/HDL RATIO: 4.7 ratio
Cholesterol: 122 mg/dL (ref 0–200)
HDL: 26 mg/dL — AB (ref >40)
LDL Cholesterol: 52 mg/dL (ref 0–99)
Triglycerides: 221 mg/dL — ABNORMAL HIGH (ref <150)
VLDL: 44 mg/dL — ABNORMAL HIGH (ref 0–40)

## 2016-09-14 LAB — BRAIN NATRIURETIC PEPTIDE: B Natriuretic Peptide: 49.6 pg/mL (ref 0.0–100.0)

## 2016-09-14 MED ORDER — RANOLAZINE ER 1000 MG PO TB12: 1000.0000 mg | ORAL_TABLET | Freq: Two times a day (BID) | ORAL | 6 refills | Status: DC

## 2016-09-14 NOTE — Telephone Encounter (Signed)
Left message on scheduling line @ [facility] to call pt to schedule w/ new pt appt [name]

## 2016-09-14 NOTE — Patient Instructions (Signed)
Increase Ranolazine to 1000 mg Twice daily   Labs today  Your physician has requested that you have an echocardiogram. Echocardiography is a painless test that uses sound waves to create images of your heart. It provides your doctor with information about the size and shape of your heart and how well your heart's chambers and valves are working. This procedure takes approximately one hour. There are no restrictions for this procedure.  Your physician has requested that you have a lower or upper extremity arterial duplex. This test is an ultrasound of the arteries in the legs or arms. It looks at arterial blood flow in the legs and arms. Allow one hour for Lower and Upper Arterial scans. There are no restrictions or special instructions  You have been referred to Dr Trula Slade and [facility]   We will contact you in 6 months to schedule your next appointment.

## 2016-09-15 ENCOUNTER — Other Ambulatory Visit (HOSPITAL_COMMUNITY): Payer: Self-pay | Admitting: Internal Medicine

## 2016-09-15 ENCOUNTER — Encounter (HOSPITAL_COMMUNITY): Payer: Self-pay | Admitting: *Deleted
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
## 2016-09-22 ENCOUNTER — Other Ambulatory Visit (HOSPITAL_COMMUNITY): Payer: Self-pay | Admitting: Internal Medicine

## 2016-10-15 ENCOUNTER — Other Ambulatory Visit (HOSPITAL_COMMUNITY): Payer: Medicare Other

## 2016-10-20 ENCOUNTER — Other Ambulatory Visit (HOSPITAL_COMMUNITY): Payer: Self-pay | Admitting: Cardiology

## 2016-10-20 DIAGNOSIS — R0989 Other specified symptoms and signs involving the circulatory and respiratory systems: Secondary | ICD-10-CM

## 2016-10-20 DIAGNOSIS — L97509 Non-pressure chronic ulcer of other part of unspecified foot with unspecified severity: Secondary | ICD-10-CM

## 2016-10-26 ENCOUNTER — Telehealth (HOSPITAL_COMMUNITY): Payer: Self-pay | Admitting: Pharmacist

## 2016-10-26 ENCOUNTER — Ambulatory Visit (HOSPITAL_COMMUNITY): Payer: Medicare Other

## 2016-10-26 MED ORDER — RANOLAZINE ER 500 MG PO TB12: 1000.0000 mg | ORAL_TABLET | Freq: Two times a day (BID) | ORAL | 6 refills | Status: DC

## 2016-10-26 NOTE — Telephone Encounter (Signed)
Received a fax from [facility] that patient has difficulty swallowing the 1000 mg tablets so obtained a PA from [employer] for #2 of the 500 mg tablets BID through 03/22/17.   Ruta Hinds. Velva Harman, PharmD, BCPS, CPP Clinical Pharmacist Pager: [phone] Phone: [phone] 10/26/2016 1:14 PM

## 2016-10-28 ENCOUNTER — Encounter: Payer: Self-pay | Admitting: Surgery

## 2016-10-30 ENCOUNTER — Ambulatory Visit (HOSPITAL_COMMUNITY)
Admission: RE | Admit: 2016-10-30 | Payer: Medicare Other | Source: Ambulatory Visit | Attending: Cardiology | Admitting: Cardiology

## 2016-11-04 ENCOUNTER — Telehealth (HOSPITAL_COMMUNITY): Payer: Self-pay | Admitting: *Deleted

## 2016-11-04 ENCOUNTER — Ambulatory Visit (HOSPITAL_COMMUNITY)
Admission: RE | Admit: 2016-11-04 | Discharge: 2016-11-04 | Disposition: A | Discharge status: Home / Self Care | Payer: Medicare Other | Source: Ambulatory Visit | Attending: Cardiovascular Disease | Admitting: Cardiovascular Disease

## 2016-11-04 DIAGNOSIS — E11622 Type 2 diabetes mellitus with other skin ulcer: Secondary | ICD-10-CM | POA: Diagnosis not present

## 2016-11-04 DIAGNOSIS — I10 Essential (primary) hypertension: Secondary | ICD-10-CM | POA: Diagnosis not present

## 2016-11-04 DIAGNOSIS — L97909 Non-pressure chronic ulcer of unspecified part of unspecified lower leg with unspecified severity: Secondary | ICD-10-CM | POA: Insufficient documentation

## 2016-11-04 DIAGNOSIS — R0989 Other specified symptoms and signs involving the circulatory and respiratory systems: Secondary | ICD-10-CM | POA: Insufficient documentation

## 2016-11-04 DIAGNOSIS — I2510 Atherosclerotic heart disease of native coronary artery without angina pectoris: Secondary | ICD-10-CM | POA: Diagnosis not present

## 2016-11-04 DIAGNOSIS — Z951 Presence of aortocoronary bypass graft: Secondary | ICD-10-CM | POA: Insufficient documentation

## 2016-11-04 NOTE — Telephone Encounter (Signed)
Patient called requesting we fax a letter Dr.McLean wrote for her in June and her last office visit to her divorce attorney [phone]. I told patient I would have [place] resign the letter and I would fax that with the office note.

## 2016-11-04 NOTE — Telephone Encounter (Signed)
Letter and office note faxed to [phone]

## 2016-11-09 ENCOUNTER — Ambulatory Visit (INDEPENDENT_AMBULATORY_CARE_PROVIDER_SITE_OTHER): Payer: Medicare Other | Admitting: Surgery

## 2016-11-09 VITALS — BP 118/59 | HR 74 | Ht 67.0 in | Wt 323.4 lb

## 2016-11-09 DIAGNOSIS — I70213 Atherosclerosis of native arteries of extremities with intermittent claudication, bilateral legs: Secondary | ICD-10-CM
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
## 2016-11-10 NOTE — Addendum Note (Signed)
Addended by: Lianne Cure A on: 11/10/2016 12:37 PM   Modules accepted: Orders

## 2016-11-11 ENCOUNTER — Other Ambulatory Visit (HOSPITAL_COMMUNITY): Payer: Medicare Other

## 2016-11-16 ENCOUNTER — Other Ambulatory Visit (HOSPITAL_COMMUNITY): Payer: Medicare Other

## 2016-11-25 ENCOUNTER — Other Ambulatory Visit (HOSPITAL_COMMUNITY): Payer: Medicare Other

## 2016-12-01 ENCOUNTER — Other Ambulatory Visit: Payer: Self-pay

## 2016-12-01 ENCOUNTER — Ambulatory Visit (HOSPITAL_COMMUNITY): Payer: Medicare Other | Attending: Cardiology

## 2016-12-01 DIAGNOSIS — Z6841 Body Mass Index (BMI) 40.0 and over, adult: Secondary | ICD-10-CM | POA: Insufficient documentation

## 2016-12-01 DIAGNOSIS — G4733 Obstructive sleep apnea (adult) (pediatric): Secondary | ICD-10-CM | POA: Diagnosis not present

## 2016-12-01 DIAGNOSIS — E119 Type 2 diabetes mellitus without complications: Secondary | ICD-10-CM | POA: Diagnosis not present

## 2016-12-01 DIAGNOSIS — I5032 Chronic diastolic (congestive) heart failure: Secondary | ICD-10-CM | POA: Insufficient documentation

## 2016-12-01 DIAGNOSIS — E785 Hyperlipidemia, unspecified: Secondary | ICD-10-CM | POA: Insufficient documentation

## 2016-12-01 DIAGNOSIS — I358 Other nonrheumatic aortic valve disorders: Secondary | ICD-10-CM | POA: Diagnosis not present

## 2016-12-01 DIAGNOSIS — Z951 Presence of aortocoronary bypass graft: Secondary | ICD-10-CM | POA: Insufficient documentation

## 2016-12-01 DIAGNOSIS — I110 Hypertensive heart disease with heart failure: Secondary | ICD-10-CM | POA: Insufficient documentation

## 2016-12-01 DIAGNOSIS — I509 Heart failure, unspecified: Secondary | ICD-10-CM | POA: Diagnosis present

## 2016-12-01 MED ORDER — PERFLUTREN LIPID MICROSPHERE
1.0000 mL | INTRAVENOUS | Status: AC | PRN
  Administered 2016-12-01: 2 mL via INTRAVENOUS

## 2016-12-02 ENCOUNTER — Telehealth (HOSPITAL_COMMUNITY): Payer: Self-pay | Admitting: *Deleted

## 2016-12-02 NOTE — Telephone Encounter (Signed)
ECHOCARDIOGRAM COMPLETE  Order: [id number]  Status:  Final result Visible to patient:  No (Not Released) Dx:  CHRONIC DIASTOLIC HEART FAILURE  Notes recorded by Darron Doom, RN on 12/02/2016 at 11:51 AM EDT Called and spoke with patient, she's aware and no further questions. ------  Notes recorded by Larey Dresser, MD on 12/02/2016 at 7:53 AM EDT EF is normal

## 2017-04-27 ENCOUNTER — Other Ambulatory Visit (HOSPITAL_COMMUNITY): Payer: Self-pay | Admitting: Cardiology

## 2017-05-17 ENCOUNTER — Encounter (HOSPITAL_COMMUNITY): Payer: Medicare Other

## 2017-05-17 ENCOUNTER — Ambulatory Visit: Payer: Medicare Other | Admitting: Surgery

## 2017-05-17 ENCOUNTER — Telehealth: Payer: Self-pay | Admitting: *Deleted

## 2017-05-17 NOTE — Telephone Encounter (Signed)
Patient called to state she overslept and will not make today's appointment and need to reschedule. C/o ulcer and pain on right leg. Next available is for 06/11/17 with Dr. Trula Slade and she states she needs leg checked sooner. Did not want to follow up the NP. Agreeable to see her PCP re:leg ulcer ASAP, and reschedule appointments at this office.

## 2017-05-21 ENCOUNTER — Ambulatory Visit (HOSPITAL_COMMUNITY)
Admission: RE | Admit: 2017-05-21 | Discharge: 2017-05-21 | Disposition: A | Discharge status: Home / Self Care | Payer: Medicare Other | Source: Ambulatory Visit | Attending: Surgery | Admitting: Surgery

## 2017-05-21 DIAGNOSIS — I70213 Atherosclerosis of native arteries of extremities with intermittent claudication, bilateral legs: Secondary | ICD-10-CM | POA: Diagnosis present

## 2017-06-11 ENCOUNTER — Ambulatory Visit (INDEPENDENT_AMBULATORY_CARE_PROVIDER_SITE_OTHER): Payer: Medicare Other | Admitting: Surgery

## 2017-06-11 ENCOUNTER — Other Ambulatory Visit: Payer: Self-pay

## 2017-06-11 ENCOUNTER — Encounter: Payer: Self-pay | Admitting: Surgery

## 2017-06-11 VITALS — BP 101/57 | HR 92 | Temp 98.9°F | Resp 16 | Ht 66.0 in | Wt 314.0 lb

## 2017-06-11 DIAGNOSIS — L97301 Non-pressure chronic ulcer of unspecified ankle limited to breakdown of skin: Secondary | ICD-10-CM

## 2017-06-11 DIAGNOSIS — I83003 Varicose veins of unspecified lower extremity with ulcer of ankle: Secondary | ICD-10-CM
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
## 2017-07-29 ENCOUNTER — Other Ambulatory Visit (HOSPITAL_COMMUNITY): Payer: Self-pay | Admitting: Cardiology

## 2017-08-03 ENCOUNTER — Other Ambulatory Visit (HOSPITAL_COMMUNITY): Payer: Self-pay | Admitting: *Deleted

## 2017-08-03 MED ORDER — EFFIENT 10 MG PO TABS: 10.0000 mg | ORAL_TABLET | Freq: Every day | ORAL | 3 refills | Status: DC

## 2017-08-04 ENCOUNTER — Telehealth (HOSPITAL_COMMUNITY): Payer: Self-pay | Admitting: Cardiology

## 2017-08-04 NOTE — Telephone Encounter (Signed)
Left message for patient to call back, need to schedule patient an appt with Dr. Aundra Dubin, next available.  Pt was due for f/u last December.

## 2017-08-10 NOTE — Telephone Encounter (Signed)
Patient returned my call.  Patient is scheduled 10/15/17 @2 :40 with Dr. Aundra Dubin.

## 2017-08-12 DIAGNOSIS — I70213 Atherosclerosis of native arteries of extremities with intermittent claudication, bilateral legs: Secondary | ICD-10-CM | POA: Insufficient documentation

## 2017-10-15 ENCOUNTER — Encounter (HOSPITAL_COMMUNITY): Payer: Medicare Other | Admitting: Cardiology

## 2017-11-12 ENCOUNTER — Other Ambulatory Visit (HOSPITAL_COMMUNITY): Payer: Self-pay | Admitting: Internal Medicine

## 2018-01-31 ENCOUNTER — Other Ambulatory Visit (HOSPITAL_COMMUNITY): Payer: Self-pay | Admitting: Internal Medicine

## 2018-02-08 ENCOUNTER — Other Ambulatory Visit (HOSPITAL_COMMUNITY): Payer: Self-pay | Admitting: Internal Medicine

## 2018-02-28 ENCOUNTER — Other Ambulatory Visit (HOSPITAL_COMMUNITY): Payer: Self-pay | Admitting: Internal Medicine

## 2018-03-14 DIAGNOSIS — Z124 Encounter for screening for malignant neoplasm of cervix: Secondary | ICD-10-CM | POA: Insufficient documentation

## 2018-03-29 ENCOUNTER — Other Ambulatory Visit (HOSPITAL_COMMUNITY): Payer: Self-pay | Admitting: Internal Medicine

## 2018-04-06 ENCOUNTER — Other Ambulatory Visit (HOSPITAL_COMMUNITY): Payer: Self-pay

## 2018-04-06 MED ORDER — RANOLAZINE ER 1000 MG PO TB12: 1000.0000 mg | ORAL_TABLET | Freq: Two times a day (BID) | ORAL | 2 refills | Status: DC

## 2018-06-09 ENCOUNTER — Other Ambulatory Visit (HOSPITAL_COMMUNITY): Payer: Self-pay | Admitting: *Deleted

## 2018-06-15 ENCOUNTER — Other Ambulatory Visit (HOSPITAL_COMMUNITY): Payer: Self-pay | Admitting: *Deleted

## 2018-06-15 ENCOUNTER — Other Ambulatory Visit: Payer: Self-pay

## 2018-06-15 ENCOUNTER — Ambulatory Visit (HOSPITAL_COMMUNITY)
Admission: RE | Admit: 2018-06-15 | Discharge: 2018-06-15 | Disposition: A | Discharge status: Home / Self Care | Payer: Medicare Other | Source: Ambulatory Visit | Attending: Cardiology | Admitting: Cardiology

## 2018-06-15 DIAGNOSIS — I25700 Atherosclerosis of coronary artery bypass graft(s), unspecified, with unstable angina pectoris: Secondary | ICD-10-CM | POA: Diagnosis not present

## 2018-06-15 DIAGNOSIS — I5032 Chronic diastolic (congestive) heart failure: Secondary | ICD-10-CM | POA: Diagnosis not present

## 2018-06-15 DIAGNOSIS — I208 Other forms of angina pectoris: Secondary | ICD-10-CM

## 2018-06-15 DIAGNOSIS — I5022 Chronic systolic (congestive) heart failure: Secondary | ICD-10-CM

## 2018-06-15 DIAGNOSIS — I70213 Atherosclerosis of native arteries of extremities with intermittent claudication, bilateral legs: Secondary | ICD-10-CM

## 2018-06-15 MED ORDER — RANOLAZINE ER 1000 MG PO TB12: 1000.0000 mg | ORAL_TABLET | Freq: Two times a day (BID) | ORAL | 6 refills | Status: DC

## 2018-06-15 MED ORDER — ICOSAPENT ETHYL 1 G PO CAPS: 2.0000 g | ORAL_CAPSULE | Freq: Two times a day (BID) | ORAL | 6 refills | Status: DC

## 2018-06-15 MED ORDER — TORSEMIDE 20 MG PO TABS: ORAL_TABLET | ORAL | 5 refills | Status: DC

## 2018-06-15 MED ORDER — RANOLAZINE ER 500 MG PO TB12: 1000.0000 mg | ORAL_TABLET | Freq: Two times a day (BID) | ORAL | 6 refills | Status: DC
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
## 2018-06-15 NOTE — Patient Instructions (Addendum)
Lab work is going to be drawn by Museum/gallery conservator.  Lab work will need to be done again in 2 months. This is scheduled for  May 26th at 2:00pm. They parking code will be: [address].  INCREASE Torsemide 40mg  (2 tabs) two times a daily for FOR 4 DAYS ONLY. THEN START 40mg  (2 tabs) every morning and 20mg  (1 tab) every evening.   CHANGE Ranexa 1000mg  (2 tab) two times a day. This has been sent into your pharmacy as a brand only product.  START Vascepa 2g (2 Caps) two times a day.   Follow up with a telehealth visit on April 3rd at 2:40p.

## 2018-06-20 ENCOUNTER — Encounter (HOSPITAL_COMMUNITY): Payer: Medicare Other | Admitting: Cardiology

## 2018-06-24 ENCOUNTER — Other Ambulatory Visit: Payer: Self-pay

## 2018-06-24 ENCOUNTER — Telehealth (HOSPITAL_COMMUNITY): Payer: Self-pay

## 2018-06-24 ENCOUNTER — Ambulatory Visit (HOSPITAL_COMMUNITY)
Admission: RE | Admit: 2018-06-24 | Discharge: 2018-06-24 | Disposition: A | Discharge status: Home / Self Care | Payer: Medicare Other | Source: Ambulatory Visit | Attending: Cardiology | Admitting: Cardiology

## 2018-06-24 DIAGNOSIS — I5022 Chronic systolic (congestive) heart failure: Secondary | ICD-10-CM

## 2018-06-24 DIAGNOSIS — I208 Other forms of angina pectoris: Secondary | ICD-10-CM | POA: Diagnosis not present

## 2018-06-24 DIAGNOSIS — I5032 Chronic diastolic (congestive) heart failure: Secondary | ICD-10-CM | POA: Diagnosis not present

## 2018-06-24 MED ORDER — TORSEMIDE 20 MG PO TABS: ORAL_TABLET | ORAL | 5 refills | Status: DC

## 2018-06-24 NOTE — Patient Instructions (Signed)
Torsemide 40mg  BID x 4 days then 40mg  am & 20mg  pm  Homehealth to draw BMET in 10 days ([facility])  Will attempt to repeal Renexa PA  Telehealth f/u scheduled 5/15 @11 

## 2018-06-24 NOTE — Telephone Encounter (Signed)
Torsemide 40mg  BID x 4 days then 40mg  am & 20mg  pm  Homehealth to draw BMET in 10 days ([facility])  Will attempt to repeal Renexa PA  Telehealth f/u scheduled 5/15 @11   Pt verbalized understanding and denied any further questions
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
## 2018-06-28 ENCOUNTER — Other Ambulatory Visit (HOSPITAL_COMMUNITY): Payer: Self-pay | Admitting: Cardiology

## 2018-07-23 ENCOUNTER — Other Ambulatory Visit (HOSPITAL_COMMUNITY): Payer: Self-pay | Admitting: Cardiology

## 2018-07-30 ENCOUNTER — Other Ambulatory Visit (HOSPITAL_COMMUNITY): Payer: Self-pay | Admitting: Cardiology

## 2018-08-01 ENCOUNTER — Telehealth (HOSPITAL_COMMUNITY): Payer: Self-pay

## 2018-08-01 NOTE — Telephone Encounter (Signed)
Pt confirmed she is on Ranexa 100mg  twice a day.  PA completed however was denied for brand. Per Dr. Aundra Dubin pt has to continue with generic. Pt made aware of same.

## 2018-08-01 NOTE — Telephone Encounter (Signed)
Attempting PA for Ranexa, however two doses on med list. Called patient to verify which dose she is on.  LM on VM to call back

## 2018-08-05 ENCOUNTER — Other Ambulatory Visit: Payer: Self-pay

## 2018-08-05 ENCOUNTER — Ambulatory Visit (HOSPITAL_COMMUNITY)
Admission: RE | Admit: 2018-08-05 | Discharge: 2018-08-05 | Disposition: A | Discharge status: Home / Self Care | Payer: Medicare Other | Source: Ambulatory Visit | Attending: Cardiology | Admitting: Cardiology

## 2018-08-05 ENCOUNTER — Telehealth (HOSPITAL_COMMUNITY): Payer: Medicare Other | Admitting: Cardiology

## 2018-08-05 DIAGNOSIS — I5032 Chronic diastolic (congestive) heart failure: Secondary | ICD-10-CM

## 2018-08-05 MED ORDER — ENALAPRIL MALEATE 5 MG PO TABS: 5.0000 mg | ORAL_TABLET | Freq: Every day | ORAL | 3 refills | Status: DC

## 2018-08-05 NOTE — Progress Notes (Signed)
Spoke to pt. Reviewed after visit summary. Sent lab scripts to PCP. No further questions at this time.

## 2018-08-05 NOTE — Patient Instructions (Addendum)
Lab work will need to be done. The prescription for this lab work has been sent to your PCP: Dr. Rosana Hoes. Please call them to schedule an appointment with them.  DECREASE Enalapril to 5mg  daily.  Please follow up with Dr. Aundra Dubin in 3 months. This is scheduled for Aug 24th at 1:40p. The parking code is [id number].
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
## 2018-08-16 ENCOUNTER — Other Ambulatory Visit (HOSPITAL_COMMUNITY): Payer: Medicare Other

## 2018-10-01 ENCOUNTER — Inpatient Hospital Stay (HOSPITAL_COMMUNITY)
Admission: AD | Admit: 2018-10-01 | Discharge: 2018-10-17 | DRG: 246 | Disposition: A | Discharge status: Home / Self Care | Payer: Medicare Other | Source: Other Acute Inpatient Hospital | Attending: Cardiology | Admitting: Cardiology

## 2018-10-01 DIAGNOSIS — N183 Chronic kidney disease, stage 3 (moderate): Secondary | ICD-10-CM | POA: Diagnosis present

## 2018-10-01 DIAGNOSIS — Z7902 Long term (current) use of antithrombotics/antiplatelets: Secondary | ICD-10-CM | POA: Diagnosis not present

## 2018-10-01 DIAGNOSIS — Z79899 Other long term (current) drug therapy: Secondary | ICD-10-CM

## 2018-10-01 DIAGNOSIS — E785 Hyperlipidemia, unspecified: Secondary | ICD-10-CM | POA: Diagnosis present

## 2018-10-01 DIAGNOSIS — Z951 Presence of aortocoronary bypass graft: Secondary | ICD-10-CM

## 2018-10-01 DIAGNOSIS — I4510 Unspecified right bundle-branch block: Secondary | ICD-10-CM | POA: Diagnosis present

## 2018-10-01 DIAGNOSIS — I509 Heart failure, unspecified: Secondary | ICD-10-CM

## 2018-10-01 DIAGNOSIS — I10 Essential (primary) hypertension: Secondary | ICD-10-CM

## 2018-10-01 DIAGNOSIS — I5022 Chronic systolic (congestive) heart failure: Secondary | ICD-10-CM

## 2018-10-01 DIAGNOSIS — I878 Other specified disorders of veins: Secondary | ICD-10-CM | POA: Diagnosis present

## 2018-10-01 DIAGNOSIS — I872 Venous insufficiency (chronic) (peripheral): Secondary | ICD-10-CM | POA: Diagnosis present

## 2018-10-01 DIAGNOSIS — L97221 Non-pressure chronic ulcer of left calf limited to breakdown of skin: Secondary | ICD-10-CM | POA: Diagnosis present

## 2018-10-01 DIAGNOSIS — I25710 Atherosclerosis of autologous vein coronary artery bypass graft(s) with unstable angina pectoris: Secondary | ICD-10-CM | POA: Diagnosis not present

## 2018-10-01 DIAGNOSIS — I5032 Chronic diastolic (congestive) heart failure: Secondary | ICD-10-CM

## 2018-10-01 DIAGNOSIS — Z794 Long term (current) use of insulin: Secondary | ICD-10-CM

## 2018-10-01 DIAGNOSIS — E1122 Type 2 diabetes mellitus with diabetic chronic kidney disease: Secondary | ICD-10-CM | POA: Diagnosis present

## 2018-10-01 DIAGNOSIS — R079 Chest pain, unspecified: Secondary | ICD-10-CM | POA: Diagnosis not present

## 2018-10-01 DIAGNOSIS — I130 Hypertensive heart and chronic kidney disease with heart failure and stage 1 through stage 4 chronic kidney disease, or unspecified chronic kidney disease: Secondary | ICD-10-CM | POA: Diagnosis present

## 2018-10-01 DIAGNOSIS — R609 Edema, unspecified: Secondary | ICD-10-CM | POA: Diagnosis not present

## 2018-10-01 DIAGNOSIS — N179 Acute kidney failure, unspecified: Secondary | ICD-10-CM | POA: Diagnosis present

## 2018-10-01 DIAGNOSIS — I200 Unstable angina: Secondary | ICD-10-CM

## 2018-10-01 DIAGNOSIS — Y831 Surgical operation with implant of artificial internal device as the cause of abnormal reaction of the patient, or of later complication, without mention of misadventure at the time of the procedure: Secondary | ICD-10-CM | POA: Diagnosis present

## 2018-10-01 DIAGNOSIS — Z20828 Contact with and (suspected) exposure to other viral communicable diseases: Secondary | ICD-10-CM | POA: Diagnosis present

## 2018-10-01 DIAGNOSIS — Z91040 Latex allergy status: Secondary | ICD-10-CM

## 2018-10-01 DIAGNOSIS — G4733 Obstructive sleep apnea (adult) (pediatric): Secondary | ICD-10-CM | POA: Diagnosis present

## 2018-10-01 DIAGNOSIS — Z23 Encounter for immunization: Secondary | ICD-10-CM | POA: Diagnosis not present

## 2018-10-01 DIAGNOSIS — I25119 Atherosclerotic heart disease of native coronary artery with unspecified angina pectoris: Secondary | ICD-10-CM | POA: Diagnosis present

## 2018-10-01 DIAGNOSIS — Z6841 Body Mass Index (BMI) 40.0 and over, adult: Secondary | ICD-10-CM

## 2018-10-01 DIAGNOSIS — Z7982 Long term (current) use of aspirin: Secondary | ICD-10-CM | POA: Diagnosis not present

## 2018-10-01 DIAGNOSIS — I25110 Atherosclerotic heart disease of native coronary artery with unstable angina pectoris: Principal | ICD-10-CM | POA: Diagnosis present

## 2018-10-01 DIAGNOSIS — I25700 Atherosclerosis of coronary artery bypass graft(s), unspecified, with unstable angina pectoris: Secondary | ICD-10-CM | POA: Diagnosis present

## 2018-10-01 DIAGNOSIS — I252 Old myocardial infarction: Secondary | ICD-10-CM

## 2018-10-01 DIAGNOSIS — M62830 Muscle spasm of back: Secondary | ICD-10-CM | POA: Diagnosis not present

## 2018-10-01 DIAGNOSIS — I208 Other forms of angina pectoris: Secondary | ICD-10-CM | POA: Diagnosis not present

## 2018-10-01 DIAGNOSIS — F329 Major depressive disorder, single episode, unspecified: Secondary | ICD-10-CM | POA: Diagnosis present

## 2018-10-01 DIAGNOSIS — T82855A Stenosis of coronary artery stent, initial encounter: Secondary | ICD-10-CM | POA: Diagnosis present

## 2018-10-01 DIAGNOSIS — Z888 Allergy status to other drugs, medicaments and biological substances status: Secondary | ICD-10-CM | POA: Diagnosis not present

## 2018-10-01 DIAGNOSIS — D509 Iron deficiency anemia, unspecified: Secondary | ICD-10-CM | POA: Diagnosis present

## 2018-10-01 DIAGNOSIS — Z8249 Family history of ischemic heart disease and other diseases of the circulatory system: Secondary | ICD-10-CM

## 2018-10-01 DIAGNOSIS — Z955 Presence of coronary angioplasty implant and graft: Secondary | ICD-10-CM

## 2018-10-01 DIAGNOSIS — M109 Gout, unspecified: Secondary | ICD-10-CM | POA: Diagnosis present

## 2018-10-01 DIAGNOSIS — I5043 Acute on chronic combined systolic (congestive) and diastolic (congestive) heart failure: Secondary | ICD-10-CM | POA: Diagnosis not present

## 2018-10-01 DIAGNOSIS — I5033 Acute on chronic diastolic (congestive) heart failure: Secondary | ICD-10-CM | POA: Diagnosis present

## 2018-10-01 DIAGNOSIS — I2510 Atherosclerotic heart disease of native coronary artery without angina pectoris: Secondary | ICD-10-CM | POA: Diagnosis not present

## 2018-10-01 LAB — GLUCOSE, CAPILLARY: Glucose-Capillary: 111 mg/dL — ABNORMAL HIGH (ref 70–99)

## 2018-10-01 MED ORDER — FUROSEMIDE 10 MG/ML IJ SOLN
80.0000 mg | Freq: Once | INTRAMUSCULAR | Status: AC
  Administered 2018-10-02: 80 mg via INTRAVENOUS
  Filled 2018-10-01: qty 8

## 2018-10-01 MED ORDER — FENOFIBRATE 160 MG PO TABS
160.0000 mg | ORAL_TABLET | Freq: Every day | ORAL | Status: DC
  Administered 2018-10-02 – 2018-10-17 (×14): 160 mg via ORAL
  Filled 2018-10-01 (×16): qty 1

## 2018-10-01 MED ORDER — ICOSAPENT ETHYL 1 G PO CAPS: 2.0000 g | ORAL_CAPSULE | Freq: Two times a day (BID) | ORAL | Status: DC

## 2018-10-01 MED ORDER — CITALOPRAM HYDROBROMIDE 20 MG PO TABS
20.0000 mg | ORAL_TABLET | Freq: Every day | ORAL | Status: DC
  Administered 2018-10-02 – 2018-10-17 (×14): 20 mg via ORAL
  Filled 2018-10-01 (×15): qty 1

## 2018-10-01 MED ORDER — RANOLAZINE ER 500 MG PO TB12
1000.0000 mg | ORAL_TABLET | Freq: Two times a day (BID) | ORAL | Status: DC
  Administered 2018-10-02 – 2018-10-17 (×31): 1000 mg via ORAL
  Filled 2018-10-01 (×31): qty 2

## 2018-10-01 MED ORDER — HEPARIN SODIUM (PORCINE) 1000 UNIT/ML IJ SOLN
2000.00 | INTRAMUSCULAR | Status: DC
Start: ? — End: 2018-10-01

## 2018-10-01 MED ORDER — HYDROMORPHONE HCL 1 MG/ML IJ SOLN
1.0000 mg | Freq: Once | INTRAMUSCULAR | Status: AC
  Administered 2018-10-02: 1 mg via INTRAVENOUS
  Filled 2018-10-01: qty 1

## 2018-10-01 MED ORDER — ROSUVASTATIN CALCIUM 5 MG PO TABS
5.0000 mg | ORAL_TABLET | Freq: Every day | ORAL | Status: DC
  Administered 2018-10-02 – 2018-10-17 (×14): 5 mg via ORAL
  Filled 2018-10-01 (×14): qty 1

## 2018-10-01 MED ORDER — PRASUGREL HCL 10 MG PO TABS
10.0000 mg | ORAL_TABLET | Freq: Every morning | ORAL | Status: DC
  Administered 2018-10-02 – 2018-10-17 (×15): 10 mg via ORAL
  Filled 2018-10-01 (×17): qty 1

## 2018-10-01 MED ORDER — NITROGLYCERIN 0.4 MG SL SUBL
0.4000 mg | SUBLINGUAL_TABLET | SUBLINGUAL | Status: DC | PRN
  Administered 2018-10-05 – 2018-10-12 (×4): 0.4 mg via SUBLINGUAL
  Filled 2018-10-01 (×6): qty 1

## 2018-10-01 MED ORDER — HEPARIN (PORCINE) IN NACL 25000-0.45 UT/250ML-% IV SOLN
12.00 | INTRAVENOUS | Status: DC
Start: ? — End: 2018-10-01

## 2018-10-01 MED ORDER — GABAPENTIN 100 MG PO CAPS
200.0000 mg | ORAL_CAPSULE | Freq: Every day | ORAL | Status: DC
  Administered 2018-10-02 – 2018-10-16 (×16): 200 mg via ORAL
  Filled 2018-10-01 (×17): qty 2

## 2018-10-01 MED ORDER — CARVEDILOL 25 MG PO TABS
25.0000 mg | ORAL_TABLET | Freq: Two times a day (BID) | ORAL | Status: DC
  Administered 2018-10-02 – 2018-10-17 (×30): 25 mg via ORAL
  Filled 2018-10-01 (×31): qty 1

## 2018-10-01 MED ORDER — LORAZEPAM 0.5 MG PO TABS
0.5000 mg | ORAL_TABLET | Freq: Three times a day (TID) | ORAL | Status: DC
  Administered 2018-10-02 – 2018-10-17 (×47): 0.5 mg via ORAL
  Filled 2018-10-01 (×48): qty 1

## 2018-10-01 MED ORDER — GABAPENTIN 100 MG PO CAPS
100.0000 mg | ORAL_CAPSULE | ORAL | Status: DC
  Administered 2018-10-02 – 2018-10-17 (×29): 100 mg via ORAL
  Filled 2018-10-01 (×28): qty 1

## 2018-10-01 MED ORDER — ONDANSETRON HCL 4 MG/2ML IJ SOLN: 4.0000 mg | Freq: Four times a day (QID) | INTRAMUSCULAR | Status: DC | PRN

## 2018-10-01 MED ORDER — NITROGLYCERIN IN D5W 200-5 MCG/ML-% IV SOLN
2.0000 ug/min | INTRAVENOUS | Status: DC
  Administered 2018-10-02: 5 ug/min via INTRAVENOUS
  Filled 2018-10-01: qty 250

## 2018-10-01 MED ORDER — ASPIRIN EC 81 MG PO TBEC
81.0000 mg | DELAYED_RELEASE_TABLET | Freq: Every day | ORAL | Status: DC
  Administered 2018-10-02 – 2018-10-17 (×14): 81 mg via ORAL
  Filled 2018-10-01 (×14): qty 1

## 2018-10-01 MED ORDER — CITALOPRAM HYDROBROMIDE 20 MG PO TABS
30.0000 mg | ORAL_TABLET | Freq: Every day | ORAL | Status: DC
  Administered 2018-10-02 – 2018-10-16 (×16): 30 mg via ORAL
  Filled 2018-10-01: qty 1
  Filled 2018-10-01 (×4): qty 2
  Filled 2018-10-01: qty 1
  Filled 2018-10-01 (×3): qty 2
  Filled 2018-10-01 (×2): qty 1
  Filled 2018-10-01 (×2): qty 2
  Filled 2018-10-01: qty 1
  Filled 2018-10-01: qty 2
  Filled 2018-10-01: qty 1

## 2018-10-01 MED ORDER — HEPARIN BOLUS VIA INFUSION
4000.0000 [IU] | Freq: Once | INTRAVENOUS | Status: AC
  Administered 2018-10-01: 4000 [IU] via INTRAVENOUS
  Filled 2018-10-01: qty 4000

## 2018-10-01 MED ORDER — INSULIN ASPART 100 UNIT/ML ~~LOC~~ SOLN
0.0000 [IU] | Freq: Three times a day (TID) | SUBCUTANEOUS | Status: DC
  Administered 2018-10-02: 2 [IU] via SUBCUTANEOUS
  Administered 2018-10-02 – 2018-10-03 (×4): 3 [IU] via SUBCUTANEOUS
  Administered 2018-10-04: 2 [IU] via SUBCUTANEOUS
  Administered 2018-10-04 – 2018-10-05 (×2): 3 [IU] via SUBCUTANEOUS
  Administered 2018-10-05: 5 [IU] via SUBCUTANEOUS
  Administered 2018-10-06: 3 [IU] via SUBCUTANEOUS
  Administered 2018-10-06 (×2): 2 [IU] via SUBCUTANEOUS
  Administered 2018-10-07: 3 [IU] via SUBCUTANEOUS
  Administered 2018-10-07: 2 [IU] via SUBCUTANEOUS
  Administered 2018-10-08 (×2): 3 [IU] via SUBCUTANEOUS
  Administered 2018-10-08: 5 [IU] via SUBCUTANEOUS
  Administered 2018-10-09 (×2): 3 [IU] via SUBCUTANEOUS
  Administered 2018-10-09: 5 [IU] via SUBCUTANEOUS
  Administered 2018-10-10 – 2018-10-13 (×8): 3 [IU] via SUBCUTANEOUS
  Administered 2018-10-13: 5 [IU] via SUBCUTANEOUS
  Administered 2018-10-14: 7 [IU] via SUBCUTANEOUS
  Administered 2018-10-14 (×2): 3 [IU] via SUBCUTANEOUS
  Administered 2018-10-15: 2 [IU] via SUBCUTANEOUS
  Administered 2018-10-15 (×2): 3 [IU] via SUBCUTANEOUS
  Administered 2018-10-16 – 2018-10-17 (×2): 5 [IU] via SUBCUTANEOUS
  Administered 2018-10-17: 3 [IU] via SUBCUTANEOUS

## 2018-10-01 MED ORDER — HEPARIN (PORCINE) 25000 UT/250ML-% IV SOLN
1400.0000 [IU]/h | INTRAVENOUS | Status: DC
  Administered 2018-10-01: 1250 [IU]/h via INTRAVENOUS
  Filled 2018-10-01: qty 250

## 2018-10-01 MED ORDER — FEBUXOSTAT 40 MG PO TABS
80.0000 mg | ORAL_TABLET | Freq: Every day | ORAL | Status: DC
  Administered 2018-10-02 – 2018-10-17 (×14): 80 mg via ORAL
  Filled 2018-10-01 (×17): qty 2

## 2018-10-01 MED ORDER — INSULIN ASPART 100 UNIT/ML ~~LOC~~ SOLN
12.0000 [IU] | Freq: Three times a day (TID) | SUBCUTANEOUS | Status: DC
  Administered 2018-10-02 – 2018-10-17 (×35): 12 [IU] via SUBCUTANEOUS

## 2018-10-01 MED ORDER — INSULIN GLARGINE 100 UNIT/ML ~~LOC~~ SOLN
50.0000 [IU] | Freq: Two times a day (BID) | SUBCUTANEOUS | Status: DC
  Administered 2018-10-02 – 2018-10-17 (×29): 50 [IU] via SUBCUTANEOUS
  Filled 2018-10-01 (×36): qty 0.5

## 2018-10-01 MED ORDER — ENALAPRIL MALEATE 5 MG PO TABS
5.0000 mg | ORAL_TABLET | Freq: Every day | ORAL | Status: DC
  Administered 2018-10-02: 5 mg via ORAL
  Filled 2018-10-01 (×2): qty 1
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
## 2018-10-02 ENCOUNTER — Inpatient Hospital Stay (HOSPITAL_COMMUNITY): Payer: Medicare Other

## 2018-10-02 ENCOUNTER — Inpatient Hospital Stay: Payer: Self-pay

## 2018-10-02 DIAGNOSIS — I5033 Acute on chronic diastolic (congestive) heart failure: Secondary | ICD-10-CM

## 2018-10-02 DIAGNOSIS — R079 Chest pain, unspecified: Secondary | ICD-10-CM

## 2018-10-02 LAB — GLUCOSE, CAPILLARY
Glucose-Capillary: 171 mg/dL — ABNORMAL HIGH (ref 70–99)
Glucose-Capillary: 162 mg/dL — ABNORMAL HIGH (ref 70–99)
Glucose-Capillary: 126 mg/dL — ABNORMAL HIGH (ref 70–99)

## 2018-10-02 LAB — TROPONIN I (HIGH SENSITIVITY)
Troponin I (High Sensitivity): 66 ng/L — ABNORMAL HIGH (ref <18)
Troponin I (High Sensitivity): 73 ng/L — ABNORMAL HIGH (ref <18)
Troponin I (High Sensitivity): 76 ng/L — ABNORMAL HIGH (ref <18)

## 2018-10-02 LAB — COMPREHENSIVE METABOLIC PANEL
ALT: 73 U/L — ABNORMAL HIGH (ref 0–44)
AST: 52 U/L — ABNORMAL HIGH (ref 15–41)
Albumin: 3.5 g/dL (ref 3.5–5.0)
Alkaline Phosphatase: 52 U/L (ref 38–126)
Anion gap: 8 (ref 5–15)
BUN: 38 mg/dL — ABNORMAL HIGH (ref 6–20)
CO2: 26 mmol/L (ref 22–32)
Calcium: 9.2 mg/dL (ref 8.9–10.3)
Chloride: 106 mmol/L (ref 98–111)
Creatinine, Ser: 2.32 mg/dL — ABNORMAL HIGH (ref 0.44–1.00)
GFR calc Af Amer: 27 mL/min — ABNORMAL LOW (ref >60)
GFR calc non Af Amer: 23 mL/min — ABNORMAL LOW (ref >60)
Glucose, Bld: 125 mg/dL — ABNORMAL HIGH (ref 70–99)
Potassium: 4.3 mmol/L (ref 3.5–5.1)
Sodium: 140 mmol/L (ref 135–145)
Total Bilirubin: 0.5 mg/dL (ref 0.3–1.2)
Total Protein: 6.9 g/dL (ref 6.5–8.1)

## 2018-10-02 LAB — BRAIN NATRIURETIC PEPTIDE: B Natriuretic Peptide: 203.9 pg/mL — ABNORMAL HIGH (ref 0.0–100.0)

## 2018-10-02 LAB — BASIC METABOLIC PANEL
Anion gap: 7 (ref 5–15)
BUN: 39 mg/dL — ABNORMAL HIGH (ref 6–20)
CO2: 26 mmol/L (ref 22–32)
Calcium: 9.1 mg/dL (ref 8.9–10.3)
Chloride: 107 mmol/L (ref 98–111)
Creatinine, Ser: 2.36 mg/dL — ABNORMAL HIGH (ref 0.44–1.00)
GFR calc Af Amer: 26 mL/min — ABNORMAL LOW (ref >60)
GFR calc non Af Amer: 23 mL/min — ABNORMAL LOW (ref >60)
Glucose, Bld: 192 mg/dL — ABNORMAL HIGH (ref 70–99)
Potassium: 4.7 mmol/L (ref 3.5–5.1)
Sodium: 140 mmol/L (ref 135–145)

## 2018-10-02 LAB — CBC
HCT: 31.5 % — ABNORMAL LOW (ref 36.0–46.0)
Hemoglobin: 10.5 g/dL — ABNORMAL LOW (ref 12.0–15.0)
MCH: 29 pg (ref 26.0–34.0)
MCHC: 33.3 g/dL (ref 30.0–36.0)
MCV: 87 fL (ref 80.0–100.0)
Platelets: 234 10*3/uL (ref 150–400)
RBC: 3.62 MIL/uL — ABNORMAL LOW (ref 3.87–5.11)
RDW: 12.2 % (ref 11.5–15.5)
WBC: 7.8 10*3/uL (ref 4.0–10.5)
nRBC: 0 % (ref 0.0–0.2)

## 2018-10-02 LAB — ECHOCARDIOGRAM COMPLETE
Height: 67 in
Weight: 5245.18 oz

## 2018-10-02 LAB — LIPID PANEL
Cholesterol: 116 mg/dL (ref 0–200)
HDL: 35 mg/dL — ABNORMAL LOW (ref >40)
LDL Cholesterol: 65 mg/dL (ref 0–99)
Total CHOL/HDL Ratio: 3.3 RATIO
Triglycerides: 82 mg/dL (ref <150)
VLDL: 16 mg/dL (ref 0–40)

## 2018-10-02 LAB — HEPARIN LEVEL (UNFRACTIONATED): Heparin Unfractionated: 0.37 IU/mL (ref 0.30–0.70)

## 2018-10-02 LAB — HIV ANTIBODY (ROUTINE TESTING W REFLEX): HIV Screen 4th Generation wRfx: NONREACTIVE

## 2018-10-02 LAB — MAGNESIUM: Magnesium: 2.2 mg/dL (ref 1.7–2.4)

## 2018-10-02 MED ORDER — ISOSORBIDE MONONITRATE ER 60 MG PO TB24
60.0000 mg | ORAL_TABLET | Freq: Every day | ORAL | Status: DC
  Administered 2018-10-02 – 2018-10-04 (×3): 60 mg via ORAL
  Filled 2018-10-02 (×3): qty 1

## 2018-10-02 MED ORDER — HEPARIN SODIUM (PORCINE) 5000 UNIT/ML IJ SOLN
5000.0000 [IU] | Freq: Three times a day (TID) | INTRAMUSCULAR | Status: DC
  Administered 2018-10-02 – 2018-10-07 (×15): 5000 [IU] via SUBCUTANEOUS
  Filled 2018-10-02 (×15): qty 1

## 2018-10-02 MED ORDER — HYDROMORPHONE HCL 1 MG/ML IJ SOLN
1.0000 mg | Freq: Once | INTRAMUSCULAR | Status: AC
  Administered 2018-10-02: 04:00:00 1 mg via INTRAVENOUS
  Filled 2018-10-02: qty 1

## 2018-10-02 MED ORDER — FUROSEMIDE 10 MG/ML IJ SOLN
INTRAMUSCULAR | Status: AC
  Filled 2018-10-02: qty 4

## 2018-10-02 MED ORDER — PERFLUTREN LIPID MICROSPHERE
1.0000 mL | INTRAVENOUS | Status: AC | PRN
  Administered 2018-10-02: 2 mL via INTRAVENOUS
  Filled 2018-10-02: qty 10

## 2018-10-02 MED ORDER — FUROSEMIDE 10 MG/ML IJ SOLN
80.0000 mg | Freq: Two times a day (BID) | INTRAMUSCULAR | Status: DC
  Administered 2018-10-02 (×2): 80 mg via INTRAVENOUS
  Filled 2018-10-02 (×2): qty 8

## 2018-10-02 MED ORDER — HYDROMORPHONE HCL 1 MG/ML IJ SOLN
1.0000 mg | Freq: Once | INTRAMUSCULAR | Status: AC
  Administered 2018-10-02: 1 mg via INTRAVENOUS
  Filled 2018-10-02: qty 1

## 2018-10-02 MED ORDER — ALUM & MAG HYDROXIDE-SIMETH 200-200-20 MG/5ML PO SUSP
30.0000 mL | ORAL | Status: DC | PRN
  Administered 2018-10-03 – 2018-10-09 (×3): 30 mL via ORAL
  Filled 2018-10-02 (×3): qty 30

## 2018-10-02 MED ORDER — SODIUM CHLORIDE 0.9% FLUSH
10.0000 mL | Freq: Two times a day (BID) | INTRAVENOUS | Status: DC
  Administered 2018-10-02 – 2018-10-09 (×13): 10 mL
  Administered 2018-10-10: 20 mL
  Administered 2018-10-11 – 2018-10-16 (×11): 10 mL

## 2018-10-02 MED ORDER — SODIUM CHLORIDE 0.9% FLUSH: 10.0000 mL | INTRAVENOUS | Status: DC | PRN

## 2018-10-02 MED ORDER — OXYCODONE HCL 5 MG PO TABS
5.0000 mg | ORAL_TABLET | Freq: Four times a day (QID) | ORAL | Status: DC | PRN
  Administered 2018-10-02 – 2018-10-08 (×9): 10 mg via ORAL
  Administered 2018-10-08: 17:00:00 5 mg via ORAL
  Administered 2018-10-09: 14:00:00 10 mg via ORAL
  Administered 2018-10-10: 18:00:00 5 mg via ORAL
  Administered 2018-10-10: 10 mg via ORAL
  Administered 2018-10-11: 5 mg via ORAL
  Administered 2018-10-11 – 2018-10-17 (×11): 10 mg via ORAL
  Filled 2018-10-02: qty 1
  Filled 2018-10-02 (×5): qty 2
  Filled 2018-10-02: qty 1
  Filled 2018-10-02 (×3): qty 2
  Filled 2018-10-02: qty 1
  Filled 2018-10-02 (×10): qty 2
  Filled 2018-10-02: qty 1
  Filled 2018-10-02 (×4): qty 2

## 2018-10-02 NOTE — Progress Notes (Addendum)
Patient ID: [name], female   DOB: 1965/12/16, 53 y.o.   MRN: [id number]     Advanced Heart Failure Rounding Note  PCP-Cardiologist: No primary care provider on file.   Subjective:    Troponin negative at [facility] 76>73>66 here.   Still with mild chest heaviness, on NTG at 12.5.  Also has "pain all over."  Felt like Dilaudid last night helped a lot.  Not short of breath at rest.    She got 1 dose Lasix 80 mg IV at midnight last night.  Creatinine stable at 2.36.    Objective:   Weight Range: (!) 148.7 kg Body mass index is 51.34 kg/m.   Vital Signs:   Temp:  [98.6 F (37 C)-99.2 F (37.3 C)] 98.6 F (37 C) (07/12 0429) Pulse Rate:  [83-92] 92 (07/12 0429) Resp:  [13-16] 16 (07/12 0429) BP: (110-133)/(61-79) 110/79 (07/12 0429) SpO2:  [100 %] 100 % (07/12 0429) Weight:  [148.7 kg] 148.7 kg (07/12 0230)    Weight change: Filed Weights   10/01/18 2234 10/02/18 0230  Weight: (!) 148.7 kg (!) 148.7 kg    Intake/Output:   Intake/Output Summary (Last 24 hours) at 10/02/2018 1029 Last data filed at 10/02/2018 0646 Gross per 24 hour  Intake 372.84 ml  Output 200 ml  Net 172.84 ml      Physical Exam    General:  Obese, NAD.  HEENT: Normal Neck: Supple. JVP 12-14 cm with HJR.  No lymphadenopathy or thyromegaly appreciated. Cor: PMI nonpalpable. Regular rate & rhythm. No rubs, gallops or murmurs. Lungs: Clear Abdomen: Soft, nontender, nondistended. No hepatosplenomegaly. No bruits or masses. Good bowel sounds. Extremities: No cyanosis, clubbing, rash. 1+ edema 1/2 to knees.  Left lower leg wrapped.  Neuro: Alert & orientedx3, cranial nerves grossly intact. moves all 4 extremities w/o difficulty. Affect pleasant   Telemetry   NSR 70s (personally reviewed).   EKG    NSR, RBBB, LAFB, old inferior MI (personally reviewed).   Labs    CBC Recent Labs    10/02/18 0146  WBC 7.8  HGB 10.5*  HCT 31.5*  MCV 87.0  PLT 656   Basic Metabolic  Panel Recent Labs    10/01/18 2330 10/02/18 0146  NA 140 140  K 4.3 4.7  CL 106 107  CO2 26 26  GLUCOSE 125* 192*  BUN 38* 39*  CREATININE 2.32* 2.36*  CALCIUM 9.2 9.1  MG 2.2  --    Liver Function Tests Recent Labs    10/01/18 2330  AST 52*  ALT 73*  ALKPHOS 52  BILITOT 0.5  PROT 6.9  ALBUMIN 3.5   No results for input(s): LIPASE, AMYLASE in the last 72 hours. Cardiac Enzymes No results for input(s): CKTOTAL, CKMB, CKMBINDEX, TROPONINI in the last 72 hours.  BNP: BNP (last 3 results) Recent Labs    10/01/18 2330  BNP 203.9*    ProBNP (last 3 results) No results for input(s): PROBNP in the last 8760 hours.   D-Dimer No results for input(s): DDIMER in the last 72 hours. Hemoglobin A1C No results for input(s): HGBA1C in the last 72 hours. Fasting Lipid Panel Recent Labs    10/02/18 0146  CHOL 116  HDL 35*  LDLCALC 65  TRIG 82  CHOLHDL 3.3   Thyroid Function Tests No results for input(s): TSH, T4TOTAL, T3FREE, THYROIDAB in the last 72 hours.  Invalid input(s): FREET3  Other results:   Imaging     No results found.  Medications:     Scheduled Medications: . aspirin EC  81 mg Oral Daily  . carvedilol  25 mg Oral BID WC  . citalopram  20 mg Oral Daily  . citalopram  30 mg Oral QHS  . enalapril  5 mg Oral Daily  . febuxostat  80 mg Oral Daily  . fenofibrate  160 mg Oral Daily  . furosemide      . gabapentin  100 mg Oral 2 times per day  . gabapentin  200 mg Oral QHS  . Icosapent Ethyl  2 g Oral BID  . insulin aspart  0-15 Units Subcutaneous TID WC  . insulin aspart  12 Units Subcutaneous TID WC  . insulin glargine  50 Units Subcutaneous BID  . LORazepam  0.5 mg Oral Q8H  . prasugrel  10 mg Oral q morning - 10a  . ranolazine  1,000 mg Oral BID  . rosuvastatin  5 mg Oral Daily     Infusions: . heparin 1,400 Units/hr (10/02/18 0229)  . nitroGLYCERIN 12.5 mcg/min (10/02/18 0646)     PRN Medications:  nitroGLYCERIN,  ondansetron (ZOFRAN) IV   Assessment/Plan   1. CAD: Very aggressive coronary disease. Had 4 more drug-eluting stents placed in 12/11, 1 in the SVG-LAD and 3 to open the totally occluded native RCA after occlusion of the SVG-PDA. She was deemed not to be a candidate for re-do CABG at that admission by cardiac surgery. Unfortunately, due to her body habitus, the surgeons were unable to mobilize her LIMA for grafting during her initial CABG operation. Lexiscan Cardiolite in 3/14 showed no ischemia or infarction though study was limited by body habitus. She has continued to have atypical chest pain episodes, including the episode that brought her to the hospital on 7/11.  HsTnI is mildly elevated with no trend, seems more consistent with demand ischemia from volume overload.  It is certainly possible that her symptoms are due to CHF, doubt ACS.   - Continue ASA, statin, Vascepa.  - Continue Effient long-term since she is a poor Plavix responder and has multiple DES.   -Continue ranolazine 1000 mg bid.   - Can stop NTG gtt and start back on home Imdur 60 mg daily. - Doubt ACS, think we can stop heparin gtt and use prophylactic heparin.  - Will plan Lexiscan Cardiolite prior to discharge.  High threshold for cath given CKD stage 3.       2. Acute on chronic diastolic CHF: NYHA class III symptoms at home. Most recent echo in 9/18 with EF 55-60%, moderate LVH.  Exam is difficult, but I think that she is volume overloaded.  CHF exacerbation may be the cause of her symptoms.  - CXR PA/lateral.  - Would like PICC line to guide diuresis given difficult exam, will order.  Follow CVP.   - Lasix 80 mg IV bid and follow response.  - Echo this admission.  3. Hyperlipidemia: Good LDL and TGs on labs today.  - Continue Vascepa.   - Continue Crestor.  4. Obesity: She needs to continue weight loss efforts. 5.Venous insufficiency:  ABIs normal in 3/19. She has history of venous stasis ulcerations that  continue to be present.  - Wound consult.  6. CKD: Stage 3.  Creatinine 2.36, this appears to be her baseline.   - Continue low dose enalapril unless creatinine rises with diuresis.  - Follow creatinine closely with diuresis.   Length of Stay: 1  Loralie Champagne, MD  10/02/2018, 10:29 AM  Advanced Heart Failure Team Pager [phone] (M-F; [place])  Please contact [facility] for night-coverage after hours (4p -7a ) and weekends on [email or web address]

## 2018-10-02 NOTE — Progress Notes (Signed)
  Echocardiogram 2D Echocardiogram has been performed.  Jennette Dubin 10/02/2018, 3:04 PM

## 2018-10-02 NOTE — Progress Notes (Signed)
Charlett Lango, RN concerning order for PICC. Patient can sign consent. Order for CVP measurement. DL sufficient per Con Memos, RN. Will move forward with PICC placement.

## 2018-10-02 NOTE — Plan of Care (Signed)
POC initiated and progressing. 

## 2018-10-02 NOTE — Progress Notes (Signed)
Peripherally Inserted Central Catheter/Midline Placement  The IV Nurse has discussed with the patient and/or persons authorized to consent for the patient, the purpose of this procedure and the potential benefits and risks involved with this procedure.  The benefits include less needle sticks, lab draws from the catheter, and the patient may be discharged home with the catheter. Risks include, but not limited to, infection, bleeding, blood clot (thrombus formation), and puncture of an artery; nerve damage and irregular heartbeat and possibility to perform a PICC exchange if needed/ordered by physician.  Alternatives to this procedure were also discussed.  Bard Power PICC patient education guide, fact sheet on infection prevention and patient information card has been provided to patient /or left at bedside.    PICC/Midline Placement Documentation  PICC Double Lumen 10/02/18 PICC Right Cephalic 41 cm 1 cm (Active)  Indication for Insertion or Continuance of Line Prolonged intravenous therapies 10/02/18 1600  Exposed Catheter (cm) 1 cm 10/02/18 1600  Site Assessment Clean;Dry;Intact 10/02/18 1600  Lumen #1 Status Flushed;Blood return noted;Saline locked 10/02/18 1600  Lumen #2 Status Flushed;Blood return noted;Saline locked 10/02/18 1600  Dressing Type Transparent 10/02/18 1600  Dressing Status Clean;Dry;Intact;Antimicrobial disc in place 10/02/18 1600  [place] checked and tightened 10/02/18 1600  Dressing Change Due 10/09/18 10/02/18 1600       Lorenza Cambridge 10/02/2018, 4:59 PM

## 2018-10-02 NOTE — Progress Notes (Signed)
[place] for Heparin Indication: chest pain/ACS  Allergies  Allergen Reactions  . Ciprofloxacin   . Diazepam     Valium   . Latex   . Levofloxacin   . Simvastatin   . Tylenol Pm Extra [Diphenhydramine-Apap (Sleep)] Hives    Patient Measurements: Height: 5\' 7"  (170.2 cm) Weight: (!) 327 lb 12.8 oz (148.7 kg) IBW/kg (Calculated) : 61.6  Total body weight: 149 kg IBW: 61.1 kg Heparin Dosing Weight: 98.2 kg  Vital Signs: Temp: 99.2 F (37.3 C) (07/11 2234) Temp Source: Oral (07/11 2234) BP: 133/61 (07/11 2234) Pulse Rate: 83 (07/11 2234)  Labs: Recent Labs    10/01/18 2330 10/02/18 0146  HGB  --  10.5*  HCT  --  31.5*  PLT  --  234  HEPARINUNFRC  --  0.37  CREATININE 2.32*  --   TROPONINIHS 76*  --     Estimated Creatinine Clearance: 42.2 mL/min (A) (by C-G formula based on SCr of 2.32 mg/dL (H)).   Assessment: 53 yo F presenting with chest pain/ACS. Pharmacy consulted to dose heparin. No anticoagulation noted PTA.  Initial heparin level 0.37 units/hr, drawn ~ 2 hours after start of drip and bolus Anticipate heparin level will decrease with time  Goal of Therapy:  Heparin level 0.3-0.7 units/ml Monitor platelets by anticoagulation protocol: Yes   Plan:  Increase heparin infusion to 1400 units/hr Check anti-Xa level in 6 hours and daily while on heparin Continue to monitor H&H and platelets  Thanks for allowing pharmacy to be a part of this patient's care.  Excell Seltzer, PharmD Clinical Pharmacist

## 2018-10-02 NOTE — Plan of Care (Signed)

## 2018-10-03 ENCOUNTER — Encounter (HOSPITAL_COMMUNITY): Payer: Self-pay | Admitting: *Deleted

## 2018-10-03 ENCOUNTER — Inpatient Hospital Stay (HOSPITAL_COMMUNITY): Payer: Medicare Other

## 2018-10-03 ENCOUNTER — Other Ambulatory Visit: Payer: Self-pay

## 2018-10-03 DIAGNOSIS — N179 Acute kidney failure, unspecified: Secondary | ICD-10-CM

## 2018-10-03 DIAGNOSIS — R079 Chest pain, unspecified: Secondary | ICD-10-CM

## 2018-10-03 LAB — BASIC METABOLIC PANEL
Anion gap: 10 (ref 5–15)
BUN: 49 mg/dL — ABNORMAL HIGH (ref 6–20)
CO2: 25 mmol/L (ref 22–32)
Calcium: 9.3 mg/dL (ref 8.9–10.3)
Chloride: 100 mmol/L (ref 98–111)
Creatinine, Ser: 3.11 mg/dL — ABNORMAL HIGH (ref 0.44–1.00)
GFR calc Af Amer: 19 mL/min — ABNORMAL LOW (ref >60)
GFR calc non Af Amer: 16 mL/min — ABNORMAL LOW (ref >60)
Glucose, Bld: 181 mg/dL — ABNORMAL HIGH (ref 70–99)
Potassium: 4.2 mmol/L (ref 3.5–5.1)
Sodium: 135 mmol/L (ref 135–145)

## 2018-10-03 LAB — CBC
HCT: 29 % — ABNORMAL LOW (ref 36.0–46.0)
Hemoglobin: 9.5 g/dL — ABNORMAL LOW (ref 12.0–15.0)
MCH: 28.8 pg (ref 26.0–34.0)
MCHC: 32.8 g/dL (ref 30.0–36.0)
MCV: 87.9 fL (ref 80.0–100.0)
Platelets: 214 10*3/uL (ref 150–400)
RBC: 3.3 MIL/uL — ABNORMAL LOW (ref 3.87–5.11)
RDW: 12.2 % (ref 11.5–15.5)
WBC: 6.3 10*3/uL (ref 4.0–10.5)
nRBC: 0 % (ref 0.0–0.2)

## 2018-10-03 LAB — GLUCOSE, CAPILLARY
Glucose-Capillary: 191 mg/dL — ABNORMAL HIGH (ref 70–99)
Glucose-Capillary: 175 mg/dL — ABNORMAL HIGH (ref 70–99)
Glucose-Capillary: 196 mg/dL — ABNORMAL HIGH (ref 70–99)
Glucose-Capillary: 162 mg/dL — ABNORMAL HIGH (ref 70–99)
Glucose-Capillary: 238 mg/dL — ABNORMAL HIGH (ref 70–99)

## 2018-10-03 LAB — COOXEMETRY PANEL
Carboxyhemoglobin: 1.5 % (ref 0.5–1.5)
Methemoglobin: 1.4 % (ref 0.0–1.5)
O2 Saturation: 55.5 %
Total hemoglobin: 10.3 g/dL — ABNORMAL LOW (ref 12.0–16.0)

## 2018-10-03 LAB — HEMOGLOBIN A1C
Hgb A1c MFr Bld: 7.8 % — ABNORMAL HIGH (ref 4.8–5.6)
Mean Plasma Glucose: 177 mg/dL

## 2018-10-03 MED ORDER — PNEUMOCOCCAL VAC POLYVALENT 25 MCG/0.5ML IJ INJ
0.5000 mL | INJECTION | INTRAMUSCULAR | Status: DC
  Filled 2018-10-03: qty 0.5

## 2018-10-03 MED ORDER — OMEGA-3-ACID ETHYL ESTERS 1 G PO CAPS
2.0000 g | ORAL_CAPSULE | Freq: Two times a day (BID) | ORAL | Status: DC
  Administered 2018-10-03 – 2018-10-17 (×13): 2 g via ORAL
  Filled 2018-10-03 (×24): qty 2

## 2018-10-03 MED ORDER — TECHNETIUM TC 99M TETROFOSMIN IV KIT
22.0000 | PACK | Freq: Once | INTRAVENOUS | Status: AC | PRN
  Administered 2018-10-03: 12:00:00 22 via INTRAVENOUS

## 2018-10-03 NOTE — Consult Note (Signed)
[place] Nurse wound consult note Patient receiving care in [facility] 4E26.  +2 palpable dorsalis pedis pulse present to Left foot. States she has worn an Haematologist before on this leg. Reason for Consult: LE ulcers Wound type: venous stasis just above the left lateral malleolus Measurement: Two small, distinct, partial thickness, 100% pink, no odor, no drainage wounds present separated by a narrow epithelial island of skin. Periwound: hard, woody in appearance, hemosiderin staining; all consistent with long term venous stasis disease  Dressing procedure/placement/frequency: Weekly application of unna boot after cleansing wounds with saline, and placing a small foam dressing over the area. Order for unna boot placed for next Monday also, in the event the patient remains hospitalized. Thank you for the consult.  Discussed plan of care with the patient and bedside nurse.  [place] nurse will not follow at this time.  Please re-consult the [place] team if needed.  Val Riles, RN, MSN, CWOCN, CNS-BC, pager [phone]

## 2018-10-03 NOTE — Progress Notes (Signed)
Patient ID: [name], female   DOB: 1965-06-10, 53 y.o.   MRN: [id number]     Advanced Heart Failure Rounding Note  PCP-Cardiologist: No primary care provider on file.   Subjective:    Troponin negative at [facility], [place] 76>73>66 here.   She diuresed some yesterday (net negative -1368) but creatinine up to 3.11 today.   CVP 7-8 cm on my measure this morning.   Echo: Technically difficult, EF 55-60%, normal RV size and systolic function, IVC normal.    Objective:   Weight Range: (!) 148.8 kg Body mass index is 51.37 kg/m.   Vital Signs:   Temp:  [98.1 F (36.7 C)-98.4 F (36.9 C)] 98.4 F (36.9 C) (07/13 0614) Pulse Rate:  [72-84] 78 (07/13 0614) Resp:  [11-17] 11 (07/13 0614) BP: (100-133)/(53-65) 133/53 (07/13 0614) SpO2:  [99 %-100 %] 100 % (07/13 0614) Weight:  [148.8 kg] 148.8 kg (07/13 0614)    Weight change: Filed Weights   10/01/18 2234 10/02/18 0230 10/03/18 0614  Weight: (!) 148.7 kg (!) 148.7 kg (!) 148.8 kg    Intake/Output:   Intake/Output Summary (Last 24 hours) at 10/03/2018 1035 Last data filed at 10/03/2018 0049 Gross per 24 hour  Intake 857.37 ml  Output 2150 ml  Net -1292.63 ml      Physical Exam    General: NAD Neck: Thick, no JVD, no thyromegaly or thyroid nodule.  Lungs: Clear to auscultation bilaterally with normal respiratory effort. CV: Nondisplaced PMI.  Heart regular S1/S2, no S3/S4, no murmur.  Trace ankle edema bilaterally.   Abdomen: Soft, nontender, no hepatosplenomegaly, no distention.  Skin: Intact without lesions or rashes.  Neurologic: Alert and oriented x 3.  Psych: Normal affect. Extremities: No clubbing or cyanosis. Venous ulceration left lateral lower leg.  HEENT: Normal.    Telemetry   NSR 70s (personally reviewed).   Labs    CBC Recent Labs    10/02/18 0146 10/03/18 0330  WBC 7.8 6.3  HGB 10.5* 9.5*  HCT 31.5* 29.0*  MCV 87.0 87.9  PLT 234 578   Basic Metabolic Panel Recent Labs   10/01/18 2330 10/02/18 0146 10/03/18 0744  NA 140 140 135  K 4.3 4.7 4.2  CL 106 107 100  CO2 26 26 25   GLUCOSE 125* 192* 181*  BUN 38* 39* 49*  CREATININE 2.32* 2.36* 3.11*  CALCIUM 9.2 9.1 9.3  MG 2.2  --   --    Liver Function Tests Recent Labs    10/01/18 2330  AST 52*  ALT 73*  ALKPHOS 52  BILITOT 0.5  PROT 6.9  ALBUMIN 3.5   No results for input(s): LIPASE, AMYLASE in the last 72 hours. Cardiac Enzymes No results for input(s): CKTOTAL, CKMB, CKMBINDEX, TROPONINI in the last 72 hours.  BNP: BNP (last 3 results) Recent Labs    10/01/18 2330  BNP 203.9*    ProBNP (last 3 results) No results for input(s): PROBNP in the last 8760 hours.   D-Dimer No results for input(s): DDIMER in the last 72 hours. Hemoglobin A1C Recent Labs    10/01/18 2330  HGBA1C 7.8*   Fasting Lipid Panel Recent Labs    10/02/18 0146  CHOL 116  HDL 35*  LDLCALC 65  TRIG 82  CHOLHDL 3.3   Thyroid Function Tests No results for input(s): TSH, T4TOTAL, T3FREE, THYROIDAB in the last 72 hours.  Invalid input(s): FREET3  Other results:   Imaging    Dg Chest 2 View  Result Date:  10/02/2018 CLINICAL DATA:  Congestive heart failure. EXAM: CHEST - 2 VIEW COMPARISON:  October 01, 2018 FINDINGS: The cardiac silhouette is enlarged. Mediastinal contours appear intact. There is no evidence of focal airspace consolidation, pleural effusion or pneumothorax. Increased interstitial markings. Osseous structures are without acute abnormality. Soft tissues are grossly normal. IMPRESSION: 1. Enlarged cardiac silhouette. 2. Increased interstitial markings, likely secondary to interstitial pulmonary edema. Electronically Signed   By: Fidela Salisbury M.D.   On: 10/02/2018 14:21   [place] Ekg Site Rite  Result Date: 10/02/2018 If Site Rite image not attached, placement could not be confirmed due to current cardiac rhythm.    Medications:     Scheduled Medications: . aspirin EC  81 mg Oral  Daily  . carvedilol  25 mg Oral BID WC  . citalopram  20 mg Oral Daily  . citalopram  30 mg Oral QHS  . febuxostat  80 mg Oral Daily  . fenofibrate  160 mg Oral Daily  . gabapentin  100 mg Oral 2 times per day  . gabapentin  200 mg Oral QHS  . heparin injection (subcutaneous)  5,000 Units Subcutaneous Q8H  . insulin aspart  0-15 Units Subcutaneous TID WC  . insulin aspart  12 Units Subcutaneous TID WC  . insulin glargine  50 Units Subcutaneous BID  . isosorbide mononitrate  60 mg Oral Daily  . LORazepam  0.5 mg Oral Q8H  . omega-3 acid ethyl esters  2 g Oral BID  . [START ON 10/04/2018] pneumococcal 23 valent vaccine  0.5 mL Intramuscular Tomorrow-1000  . prasugrel  10 mg Oral q morning - 10a  . ranolazine  1,000 mg Oral BID  . rosuvastatin  5 mg Oral Daily  . sodium chloride flush  10-40 mL Intracatheter Q12H    Infusions:   PRN Medications: alum & mag hydroxide-simeth, nitroGLYCERIN, ondansetron (ZOFRAN) IV, oxyCODONE, sodium chloride flush   Assessment/Plan   1. CAD: Very aggressive coronary disease. Had 4 more drug-eluting stents placed in 12/11, 1 in the SVG-LAD and 3 to open the totally occluded native RCA after occlusion of the SVG-PDA. She was deemed not to be a candidate for re-do CABG at that admission by cardiac surgery. Unfortunately, due to her body habitus, the surgeons were unable to mobilize her LIMA for grafting during her initial CABG operation. Lexiscan Cardiolite in 3/14 showed no ischemia or infarction though study was limited by body habitus. She has continued to have atypical chest pain episodes, including the episode that brought her to the hospital on 7/11.  HsTnI is mildly elevated with no trend, seems more consistent with demand ischemia from volume overload. Minimal chest pressure today.   - Continue ASA, statin, Vascepa.  - Continue Effient long-term since she is a poor Plavix responder and has multiple DES.   -Continue ranolazine 1000 mg bid.   -  Continue Imdur 60 mg daily. - Will plan Lexiscan Cardiolite tomorrow for prognostic purposes.  In absence of ACS, would not cath given high creatinine.    2. Acute on chronic diastolic CHF: NYHA class III symptoms at home. Echo this admission with EF 55-60%, normal RV.  She diuresed some yesterday with IV Lasix but creatinine up to 3.11.  CVP 7-8 today.  - Hold diuretic and stop enalapril.  Encourage po hydration, BMET in am.  3. Hyperlipidemia: Good LDL and TGs this admission.   - Continue Vascepa.   - Continue Crestor.  4. Obesity: She needs to continue weight loss efforts. 5.Venous  insufficiency:  ABIs normal in 3/19. She has history of venous stasis ulcerations that continue to be present.  - Wound consult has seen, recommend unna boot on left.  6. AKI on CKD stage 3.  Baseline creatinine around 2.3, up to 3.1 now with diuresis.  CVP 7-8 this morning.  - As above, stopping IV Lasix and enalapril.  - Encourage hydration.  - Follow BMET.   Length of Stay: 2  Loralie Champagne, MD  10/03/2018, 10:35 AM  Advanced Heart Failure Team Pager [phone] (M-F; 7a - 4p)  Please contact [facility] for night-coverage after hours (4p -7a ) and weekends on [email or web address]

## 2018-10-03 NOTE — Plan of Care (Signed)
POC initiated and progressing. 

## 2018-10-03 NOTE — Progress Notes (Signed)
Orthopedic Tech Progress Note Patient Details:  [name] [birth year] [id number]  Ortho Devices Type of Ortho Device: Haematologist Ortho Device/Splint Interventions: Adjustment, Application, Ordered   Post Interventions Patient Tolerated: Well Instructions Provided: [place]oper ambulation with device, Adjustment of device, Care of device   Melony Overly T 10/03/2018, 10:33 AM

## 2018-10-04 ENCOUNTER — Inpatient Hospital Stay (HOSPITAL_COMMUNITY): Payer: Medicare Other

## 2018-10-04 ENCOUNTER — Encounter (HOSPITAL_COMMUNITY): Payer: Self-pay

## 2018-10-04 LAB — NM MYOCAR MULTI W/SPECT W/WALL MOTION / EF
Estimated workload: 1 METS
MPHR: 166 {beats}/min
Peak HR: 105 {beats}/min
Percent HR: 63 %
Rest HR: 77 {beats}/min

## 2018-10-04 LAB — GLUCOSE, CAPILLARY
Glucose-Capillary: 178 mg/dL — ABNORMAL HIGH (ref 70–99)
Glucose-Capillary: 109 mg/dL — ABNORMAL HIGH (ref 70–99)
Glucose-Capillary: 195 mg/dL — ABNORMAL HIGH (ref 70–99)
Glucose-Capillary: 227 mg/dL — ABNORMAL HIGH (ref 70–99)

## 2018-10-04 LAB — CBC
HCT: 28.8 % — ABNORMAL LOW (ref 36.0–46.0)
Hemoglobin: 9.4 g/dL — ABNORMAL LOW (ref 12.0–15.0)
MCH: 28.4 pg (ref 26.0–34.0)
MCHC: 32.6 g/dL (ref 30.0–36.0)
MCV: 87 fL (ref 80.0–100.0)
Platelets: 207 10*3/uL (ref 150–400)
RBC: 3.31 MIL/uL — ABNORMAL LOW (ref 3.87–5.11)
RDW: 12 % (ref 11.5–15.5)
WBC: 5.8 10*3/uL (ref 4.0–10.5)
nRBC: 0 % (ref 0.0–0.2)

## 2018-10-04 LAB — BASIC METABOLIC PANEL
Anion gap: 7 (ref 5–15)
BUN: 48 mg/dL — ABNORMAL HIGH (ref 6–20)
CO2: 26 mmol/L (ref 22–32)
Calcium: 9.1 mg/dL (ref 8.9–10.3)
Chloride: 101 mmol/L (ref 98–111)
Creatinine, Ser: 2.68 mg/dL — ABNORMAL HIGH (ref 0.44–1.00)
GFR calc Af Amer: 22 mL/min — ABNORMAL LOW (ref >60)
GFR calc non Af Amer: 19 mL/min — ABNORMAL LOW (ref >60)
Glucose, Bld: 205 mg/dL — ABNORMAL HIGH (ref 70–99)
Potassium: 4.6 mmol/L (ref 3.5–5.1)
Sodium: 134 mmol/L — ABNORMAL LOW (ref 135–145)

## 2018-10-04 MED ORDER — ISOSORBIDE MONONITRATE ER 60 MG PO TB24
90.0000 mg | ORAL_TABLET | Freq: Every day | ORAL | Status: DC
  Filled 2018-10-04: qty 1

## 2018-10-04 MED ORDER — REGADENOSON 0.4 MG/5ML IV SOLN
INTRAVENOUS | Status: AC
  Filled 2018-10-04: qty 5

## 2018-10-04 MED ORDER — FUROSEMIDE 10 MG/ML IJ SOLN
80.0000 mg | Freq: Once | INTRAMUSCULAR | Status: AC
  Administered 2018-10-04: 80 mg via INTRAVENOUS
  Filled 2018-10-04: qty 8

## 2018-10-04 MED ORDER — REGADENOSON 0.4 MG/5ML IV SOLN
0.4000 mg | Freq: Once | INTRAVENOUS | Status: AC
  Administered 2018-10-04: 0.4 mg via INTRAVENOUS
  Filled 2018-10-04: qty 5

## 2018-10-04 MED ORDER — TECHNETIUM TC 99M TETROFOSMIN IV KIT
30.0000 | PACK | Freq: Once | INTRAVENOUS | Status: AC | PRN
  Administered 2018-10-04: 30 via INTRAVENOUS

## 2018-10-04 NOTE — Progress Notes (Addendum)
Stress portion of 2 day lexiscan completed, pending resting portion tomorrow. Final result by [facility]

## 2018-10-04 NOTE — Progress Notes (Signed)
Patient ID: [name], female   DOB: 12-15-1965, 53 y.o.   MRN: [id number]     Advanced Heart Failure Rounding Note  PCP-Cardiologist: No primary care provider on file.   Subjective:    Troponin negative at [facility] [place] 76>73>66 here.   Diuretics held yesterday, creatinine 3.11 => 2.68.   CVP 12-13 today.   Still some dyspnea with exertion.   - Echo: Technically difficult, EF 55-60%, normal RV size and systolic function, IVC normal.  - Cardiolite: EF 40% (?) with moderate inferior/inferolateral ischemia.   Objective:   Weight Range: (!) 150.5 kg Body mass index is 51.97 kg/m.   Vital Signs:   Temp:  [98.5 F (36.9 C)-98.8 F (37.1 C)] 98.8 F (37.1 C) (07/14 0829) Pulse Rate:  [79-84] 81 (07/14 0829) Resp:  [8-12] 12 (07/14 0500) BP: (99-146)/(53-118) 124/66 (07/14 1100) SpO2:  [97 %-100 %] 100 % (07/14 0829) Weight:  [150.5 kg] 150.5 kg (07/14 0500) Last BM Date: 10/03/18  Weight change: Filed Weights   10/02/18 0230 10/03/18 0614 10/04/18 0500  Weight: (!) 148.7 kg (!) 148.8 kg (!) 150.5 kg    Intake/Output:   Intake/Output Summary (Last 24 hours) at 10/04/2018 1605 Last data filed at 10/04/2018 0815 Gross per 24 hour  Intake 0 ml  Output 500 ml  Net -500 ml      Physical Exam    General: NAD Neck: Thick, JVP difficult but appears mildly elevated, no thyromegaly or thyroid nodule.  Lungs: Clear to auscultation bilaterally with normal respiratory effort. CV: Nonpalpable PMI.  Heart regular S1/S2, no S3/S4, no murmur.  Trace ankle edema.   Abdomen: Soft, nontender, no hepatosplenomegaly, no distention.  Skin: Intact without lesions or rashes.  Neurologic: Alert and oriented x 3.  Psych: Normal affect. Extremities: No clubbing or cyanosis.  HEENT: Normal.    Telemetry   NSR 70s (personally reviewed).   Labs    CBC Recent Labs    10/03/18 0330 10/04/18 0500  WBC 6.3 5.8  HGB 9.5* 9.4*  HCT 29.0* 28.8*  MCV 87.9 87.0  PLT 214  416   Basic Metabolic Panel Recent Labs    10/01/18 2330  10/03/18 0744 10/04/18 0500  NA 140   < > 135 134*  K 4.3   < > 4.2 4.6  CL 106   < > 100 101  CO2 26   < > 25 26  GLUCOSE 125*   < > 181* 205*  BUN 38*   < > 49* 48*  CREATININE 2.32*   < > 3.11* 2.68*  CALCIUM 9.2   < > 9.3 9.1  MG 2.2  --   --   --    < > = values in this interval not displayed.   Liver Function Tests Recent Labs    10/01/18 2330  AST 52*  ALT 73*  ALKPHOS 52  BILITOT 0.5  PROT 6.9  ALBUMIN 3.5   No results for input(s): LIPASE, AMYLASE in the last 72 hours. Cardiac Enzymes No results for input(s): CKTOTAL, CKMB, CKMBINDEX, TROPONINI in the last 72 hours.  BNP: BNP (last 3 results) Recent Labs    10/01/18 2330  BNP 203.9*    ProBNP (last 3 results) No results for input(s): PROBNP in the last 8760 hours.   D-Dimer No results for input(s): DDIMER in the last 72 hours. Hemoglobin A1C Recent Labs    10/01/18 2330  HGBA1C 7.8*   Fasting Lipid Panel Recent Labs    10/02/18  0146  CHOL 116  HDL 35*  LDLCALC 65  TRIG 82  CHOLHDL 3.3   Thyroid Function Tests No results for input(s): TSH, T4TOTAL, T3FREE, THYROIDAB in the last 72 hours.  Invalid input(s): FREET3  Other results:   Imaging    Nm Myocar Multi W/spect W/wall Motion / Ef  Result Date: 10/04/2018 CLINICAL DATA:  53 year old female with a 2 coronary artery disease including drug eluting stents attempted CABG. Chest pain. EXAM: MYOCARDIAL IMAGING WITH SPECT (REST AND PHARMACOLOGIC-STRESS - 2 DAY PROTOCOL) GATED LEFT VENTRICULAR WALL MOTION STUDY LEFT VENTRICULAR EJECTION FRACTION TECHNIQUE: Standard myocardial SPECT imaging was performed after resting intravenous injection of 30 mCi Tc-98m tetrofosmin. Subsequently, on a second day, intravenous infusion of Lexiscan was performed under the supervision of the Cardiology staff. At peak effect of the drug, 30 mCi Tc-50m tetrofosmin was injected intravenously and  standard myocardial SPECT imaging was performed. Quantitative gated imaging was also performed to evaluate left ventricular wall motion, and estimate left ventricular ejection fraction. COMPARISON:  None. FINDINGS: Perfusion: There is decreased perfusion to the basilar segment of the inferior inferolateral wall which improved from rest to stress but are not normal at rest. This is a moderate size defect. No additional evidence of ischemia. Exam somewhat limited by body habitus (2 day exam). Wall Motion: LEFT ventricular dilatation not worsened with stress. Global hypokinesia. Left Ventricular Ejection Fraction: 40 % End diastolic volume 423 ml End systolic volume 72 ml IMPRESSION: 1. Moderate size region of moderate reversible ischemia in the basilar segments of the inferior and inferolateral wall. 2. Global hypokinesia.  No focal wall motion abnormality 3. Left ventricular ejection fraction 40% 4. Non invasive risk stratification*: Intermediate *2012 Appropriate Use Criteria for Coronary Revascularization Focused Update: J Am Coll Cardiol. 5361;44(3):154-008. http://content.airportbarriers.com.aspx?articleid=[email or web address] These results will be called to the ordering clinician or representative by the Radiologist Assistant, and communication documented in the PACS or zVision Dashboard. Electronically Signed   By: Suzy Bouchard M.D.   On: 10/04/2018 14:39     Medications:     Scheduled Medications:  aspirin EC  81 mg Oral Daily   carvedilol  25 mg Oral BID WC   citalopram  20 mg Oral Daily   citalopram  30 mg Oral QHS   febuxostat  80 mg Oral Daily   fenofibrate  160 mg Oral Daily   furosemide  80 mg Intravenous Once   gabapentin  100 mg Oral 2 times per day   gabapentin  200 mg Oral QHS   heparin injection (subcutaneous)  5,000 Units Subcutaneous Q8H   insulin aspart  0-15 Units Subcutaneous TID WC   insulin aspart  12 Units Subcutaneous TID WC   insulin glargine  50 Units  Subcutaneous BID   isosorbide mononitrate  60 mg Oral Daily   LORazepam  0.5 mg Oral Q8H   omega-3 acid ethyl esters  2 g Oral BID   pneumococcal 23 valent vaccine  0.5 mL Intramuscular Tomorrow-1000   prasugrel  10 mg Oral q morning - 10a   ranolazine  1,000 mg Oral BID   rosuvastatin  5 mg Oral Daily   sodium chloride flush  10-40 mL Intracatheter Q12H    Infusions:   PRN Medications: alum & mag hydroxide-simeth, nitroGLYCERIN, ondansetron (ZOFRAN) IV, oxyCODONE, sodium chloride flush   Assessment/Plan   1. CAD: Very aggressive coronary disease. Had 4 more drug-eluting stents placed in 12/11, 1 in the SVG-LAD and 3 to open the totally occluded native RCA after occlusion  of the SVG-PDA. She was deemed not to be a candidate for re-do CABG at that admission by cardiac surgery. Unfortunately, due to her body habitus, the surgeons were unable to mobilize her LIMA for grafting during her initial CABG operation. Lexiscan Cardiolite in 3/14 showed no ischemia or infarction though study was limited by body habitus. She has continued to have atypical chest pain episodes, including the episode that brought her to the hospital on 7/11.  HsTnI is mildly elevated with no trend, seems more consistent with demand ischemia from volume overload. No chest pain today.  Her Cardiolite was done today, showed a moderate area of inferior ischemia.   - Continue ASA, statin, Vascepa.  - Continue Effient long-term since she is a poor Plavix responder and has multiple DES.   -Continue ranolazine 1000 mg bid.   - Increase Imdur to 90 mg daily.  - Inferior ischemia on Cardiolite, no ongoing chest pain.  Creatinine remains 2.68.  I would hold off on cardiac cath for now and aim for aggressive medical management given elevated creatinine.  2. Acute on chronic diastolic CHF: NYHA class III symptoms at home. Echo this admission with EF 55-60%, normal RV. Cardiolite with EF 40%, but I reviewed echo and think  that the Cardiolite is inaccurate, EF looks like it is in the 60% range.  She diuresed some initially with IV Lasix but creatinine up to 3.11.  Creatinine down to 2.68 today with CVP 12-13, she has exertional dyspnea.   - I will give a dose of Lasix 80 mg IV x 1 today and follow.   3. Hyperlipidemia: Good LDL and TGs this admission.   - Continue Vascepa.   - Continue Crestor.  4. Obesity: She needs to continue weight loss efforts. 5.Venous insufficiency:  ABIs normal in 3/19. She has history of venous stasis ulcerations that continue to be present.  - Wound consult has seen, recommend unna boot on left.  6. AKI on CKD stage 3.  Baseline creatinine around 2.3, up to 3.1 with diuresis and now 2.68.  CVP 12-13 this morning.  - Stay off enalapril.  - Will give 1 dose IV Lasix today.  - Follow BMET.   Length of Stay: 3  Loralie Champagne, MD  10/04/2018, 4:05 PM  Advanced Heart Failure Team Pager [phone] (M-F; 7a - 4p)  Please contact [facility] for night-coverage after hours (4p -7a ) and weekends on [email or web address]

## 2018-10-04 NOTE — Care Management Important Message (Signed)
Important Message  Patient Details  Name: [name] MRN: [id number] Date of Birth: 26-Dec-1965   Medicare Important Message Given:  Yes     Shelda Altes 10/04/2018, 2:14 PM

## 2018-10-05 ENCOUNTER — Inpatient Hospital Stay (HOSPITAL_COMMUNITY): Payer: Medicare Other

## 2018-10-05 DIAGNOSIS — I208 Other forms of angina pectoris: Secondary | ICD-10-CM

## 2018-10-05 DIAGNOSIS — I5043 Acute on chronic combined systolic (congestive) and diastolic (congestive) heart failure: Secondary | ICD-10-CM

## 2018-10-05 DIAGNOSIS — R609 Edema, unspecified: Secondary | ICD-10-CM

## 2018-10-05 LAB — GLUCOSE, CAPILLARY
Glucose-Capillary: 163 mg/dL — ABNORMAL HIGH (ref 70–99)
Glucose-Capillary: 213 mg/dL — ABNORMAL HIGH (ref 70–99)
Glucose-Capillary: 120 mg/dL — ABNORMAL HIGH (ref 70–99)
Glucose-Capillary: 114 mg/dL — ABNORMAL HIGH (ref 70–99)

## 2018-10-05 LAB — CBC
HCT: 30.6 % — ABNORMAL LOW (ref 36.0–46.0)
Hemoglobin: 9.9 g/dL — ABNORMAL LOW (ref 12.0–15.0)
MCH: 28.2 pg (ref 26.0–34.0)
MCHC: 32.4 g/dL (ref 30.0–36.0)
MCV: 87.2 fL (ref 80.0–100.0)
Platelets: 226 10*3/uL (ref 150–400)
RBC: 3.51 MIL/uL — ABNORMAL LOW (ref 3.87–5.11)
RDW: 12.2 % (ref 11.5–15.5)
WBC: 6.7 10*3/uL (ref 4.0–10.5)
nRBC: 0.4 % — ABNORMAL HIGH (ref 0.0–0.2)

## 2018-10-05 LAB — BASIC METABOLIC PANEL
Anion gap: 9 (ref 5–15)
BUN: 48 mg/dL — ABNORMAL HIGH (ref 6–20)
CO2: 25 mmol/L (ref 22–32)
Calcium: 9.8 mg/dL (ref 8.9–10.3)
Chloride: 101 mmol/L (ref 98–111)
Creatinine, Ser: 2.43 mg/dL — ABNORMAL HIGH (ref 0.44–1.00)
GFR calc Af Amer: 25 mL/min — ABNORMAL LOW (ref >60)
GFR calc non Af Amer: 22 mL/min — ABNORMAL LOW (ref >60)
Glucose, Bld: 192 mg/dL — ABNORMAL HIGH (ref 70–99)
Potassium: 4.7 mmol/L (ref 3.5–5.1)
Sodium: 135 mmol/L (ref 135–145)

## 2018-10-05 LAB — TROPONIN I (HIGH SENSITIVITY): Troponin I (High Sensitivity): 15 ng/L (ref <18)

## 2018-10-05 MED ORDER — ISOSORBIDE MONONITRATE ER 60 MG PO TB24
120.0000 mg | ORAL_TABLET | Freq: Every day | ORAL | Status: DC
  Administered 2018-10-05 – 2018-10-17 (×12): 120 mg via ORAL
  Filled 2018-10-05 (×13): qty 2

## 2018-10-05 MED ORDER — FUROSEMIDE 10 MG/ML IJ SOLN
80.0000 mg | Freq: Once | INTRAMUSCULAR | Status: AC
  Administered 2018-10-05: 80 mg via INTRAVENOUS
  Filled 2018-10-05: qty 8

## 2018-10-05 MED ORDER — SIMETHICONE 80 MG PO CHEW: 80.0000 mg | CHEWABLE_TABLET | Freq: Once | ORAL | Status: DC

## 2018-10-05 NOTE — Plan of Care (Signed)
Will continue to monitor.

## 2018-10-05 NOTE — Progress Notes (Signed)
Left lower extremity venous duplex completed. Preliminary results in Chart review CV Proc. [facility], [place] 10/05/2018, 4:35 PM

## 2018-10-05 NOTE — Progress Notes (Signed)
CARDIAC REHAB PHASE I   PRE:  Rate/Rhythm: 95 SRR  BP:  Supine:   Sitting: 149/68  Standing:    SaO2: 100% RA  MODE:  Ambulation: 0 ft   Arrived to assist patient with ambulation and patient was in bathroom, I waited for her to finish.  When she came out of bathroom she stated "I only went to the bathroom and I am having chest pain."  She said it was in the center of her chest, a "5", and very similar to the pain that brought her to the hospital. She did not strain with a BM, only urinated.  I alerted the nurse and was given NTG SL.  No ambulation appropriate at this time, will reassess tomorrow. [id number]  Liliane Channel RN, BSN 10/05/2018 2:05 PM

## 2018-10-05 NOTE — Progress Notes (Signed)
Orthopedic Tech Progress Note Patient Details:  [name] [birth year] [id number] RN called requesting me to remove the unna boot so patient could have test done on that leg, once finished I returned and reapplied unna boot Ortho Devices Type of Ortho Device: Haematologist Ortho Device/Splint Location: LLE Ortho Device/Splint Interventions: Application, Ordered, Removal, Adjustment   Post Interventions Patient Tolerated: Well Instructions Provided: Care of device, Adjustment of device   [name] 10/05/2018, 5:02 PM

## 2018-10-05 NOTE — Progress Notes (Signed)
Patient ID: [name], female   DOB: 23-Mar-1966, 53 y.o.   MRN: [id number]     Advanced Heart Failure Rounding Note  PCP-Cardiologist: No primary care provider on file.   Subjective:    Troponin negative at [facility], [place] 76>73>66 here.   She got 1 dose Lasix 80 mg IV yesterday, creatinine 3.11 => 2.68 => 2.43.   CVP 12 today.   Short of breath walking in hall.  Very mild chest pain, not exertional, at times.  Seems transient.    - Echo: Technically difficult, EF 55-60%, normal RV size and systolic function, IVC normal.  - Cardiolite: EF 40% (?) with moderate inferior/inferolateral ischemia.   Objective:   Weight Range: (!) 149.5 kg Body mass index is 51.62 kg/m.   Vital Signs:   Temp:  [98.2 F (36.8 C)-98.8 F (37.1 C)] 98.2 F (36.8 C) (07/14 2002) Pulse Rate:  [33-82] 81 (07/14 2002) Resp:  [12-81] 12 (07/14 2002) BP: (116-139)/(55-101) 116/55 (07/14 2055) SpO2:  [93 %-100 %] 100 % (07/14 2002) Weight:  [149.5 kg] 149.5 kg (07/15 0427) Last BM Date: 10/03/18  Weight change: Filed Weights   10/03/18 0614 10/04/18 0500 10/05/18 0427  Weight: (!) 148.8 kg (!) 150.5 kg (!) 149.5 kg    Intake/Output:   Intake/Output Summary (Last 24 hours) at 10/05/2018 0921 Last data filed at 10/04/2018 1300 Gross per 24 hour  Intake 360 ml  Output --  Net 360 ml      Physical Exam    General: NAD Neck: Thick, JVP difficult, no thyromegaly or thyroid nodule.  Lungs: Clear to auscultation bilaterally with normal respiratory effort. CV: Nonpalpable PMI.  Heart regular S1/S2, no S3/S4, no murmur.  1+ left ankle edema, left lower leg wrapped.   Abdomen: Soft, nontender, no hepatosplenomegaly, no distention.  Skin: Intact without lesions or rashes.  Neurologic: Alert and oriented x 3.  Psych: Normal affect. Extremities: No clubbing or cyanosis.  HEENT: Normal.    Telemetry   NSR 70s (personally reviewed).   Labs    CBC Recent Labs    10/04/18 0500  10/05/18 0500  WBC 5.8 6.7  HGB 9.4* 9.9*  HCT 28.8* 30.6*  MCV 87.0 87.2  PLT 207 875   Basic Metabolic Panel Recent Labs    10/04/18 0500 10/05/18 0500  NA 134* 135  K 4.6 4.7  CL 101 101  CO2 26 25  GLUCOSE 205* 192*  BUN 48* 48*  CREATININE 2.68* 2.43*  CALCIUM 9.1 9.8   Liver Function Tests No results for input(s): AST, ALT, ALKPHOS, BILITOT, PROT, ALBUMIN in the last 72 hours. No results for input(s): LIPASE, AMYLASE in the last 72 hours. Cardiac Enzymes No results for input(s): CKTOTAL, CKMB, CKMBINDEX, TROPONINI in the last 72 hours.  BNP: BNP (last 3 results) Recent Labs    10/01/18 2330  BNP 203.9*    ProBNP (last 3 results) No results for input(s): PROBNP in the last 8760 hours.   D-Dimer No results for input(s): DDIMER in the last 72 hours. Hemoglobin A1C No results for input(s): HGBA1C in the last 72 hours. Fasting Lipid Panel No results for input(s): CHOL, HDL, LDLCALC, TRIG, CHOLHDL, LDLDIRECT in the last 72 hours. Thyroid Function Tests No results for input(s): TSH, T4TOTAL, T3FREE, THYROIDAB in the last 72 hours.  Invalid input(s): FREET3  Other results:   Imaging    Nm Myocar Multi W/spect W/wall Motion / Ef  Result Date: 10/04/2018 CLINICAL DATA:  53 year old female with a 2  coronary artery disease including drug eluting stents attempted CABG. Chest pain. EXAM: MYOCARDIAL IMAGING WITH SPECT (REST AND PHARMACOLOGIC-STRESS - 2 DAY PROTOCOL) GATED LEFT VENTRICULAR WALL MOTION STUDY LEFT VENTRICULAR EJECTION FRACTION TECHNIQUE: Standard myocardial SPECT imaging was performed after resting intravenous injection of 30 mCi Tc-67m tetrofosmin. Subsequently, on a second day, intravenous infusion of Lexiscan was performed under the supervision of the Cardiology staff. At peak effect of the drug, 30 mCi Tc-66m tetrofosmin was injected intravenously and standard myocardial SPECT imaging was performed. Quantitative gated imaging was also performed to  evaluate left ventricular wall motion, and estimate left ventricular ejection fraction. COMPARISON:  None. FINDINGS: Perfusion: There is decreased perfusion to the basilar segment of the inferior inferolateral wall which improved from rest to stress but are not normal at rest. This is a moderate size defect. No additional evidence of ischemia. Exam somewhat limited by body habitus (2 day exam). Wall Motion: LEFT ventricular dilatation not worsened with stress. Global hypokinesia. Left Ventricular Ejection Fraction: 40 % End diastolic volume 607 ml End systolic volume 72 ml IMPRESSION: 1. Moderate size region of moderate reversible ischemia in the basilar segments of the inferior and inferolateral wall. 2. Global hypokinesia.  No focal wall motion abnormality 3. Left ventricular ejection fraction 40% 4. Non invasive risk stratification*: Intermediate *2012 Appropriate Use Criteria for Coronary Revascularization Focused Update: J Am Coll Cardiol. 3710;62(6):948-546. http://content.airportbarriers.com.aspx?articleid=[email or web address] These results will be called to the ordering clinician or representative by the Radiologist Assistant, and communication documented in the PACS or zVision Dashboard. Electronically Signed   By: Suzy Bouchard M.D.   On: 10/04/2018 14:39     Medications:     Scheduled Medications:  aspirin EC  81 mg Oral Daily   carvedilol  25 mg Oral BID WC   citalopram  20 mg Oral Daily   citalopram  30 mg Oral QHS   febuxostat  80 mg Oral Daily   fenofibrate  160 mg Oral Daily   furosemide  80 mg Intravenous Once   gabapentin  100 mg Oral 2 times per day   gabapentin  200 mg Oral QHS   heparin injection (subcutaneous)  5,000 Units Subcutaneous Q8H   insulin aspart  0-15 Units Subcutaneous TID WC   insulin aspart  12 Units Subcutaneous TID WC   insulin glargine  50 Units Subcutaneous BID   isosorbide mononitrate  90 mg Oral Daily   LORazepam  0.5 mg Oral Q8H   omega-3  acid ethyl esters  2 g Oral BID   pneumococcal 23 valent vaccine  0.5 mL Intramuscular Tomorrow-1000   prasugrel  10 mg Oral q morning - 10a   ranolazine  1,000 mg Oral BID   rosuvastatin  5 mg Oral Daily   sodium chloride flush  10-40 mL Intracatheter Q12H    Infusions:   PRN Medications: alum & mag hydroxide-simeth, nitroGLYCERIN, ondansetron (ZOFRAN) IV, oxyCODONE, sodium chloride flush   Assessment/Plan   1. CAD: Very aggressive coronary disease. Had 4 more drug-eluting stents placed in 12/11, 1 in the SVG-LAD and 3 to open the totally occluded native RCA after occlusion of the SVG-PDA. She was deemed not to be a candidate for re-do CABG at that admission by cardiac surgery. Unfortunately, due to her body habitus, the surgeons were unable to mobilize her LIMA for grafting during her initial CABG operation. Lexiscan Cardiolite in 3/14 showed no ischemia or infarction though study was limited by body habitus. She has continued to have atypical chest pain episodes, including  the episode that brought her to the hospital on 7/11.  HsTnI is mildly elevated with no trend, seems more consistent with demand ischemia from volume overload. No chest pain today.  Her Cardiolite showed a moderate area of inferior ischemia.   - Continue ASA, statin, Vascepa.  - Continue Effient long-term since she is a poor Plavix responder and has multiple DES.   -Continue ranolazine 1000 mg bid.   - Increase Imdur to 120 mg daily.  - Continue Coreg 25 mg bid.  - Inferior ischemia on Cardiolite, very mild atypical chest pain, as above she does not appear to have ACS.  She had AKI on CKD stage 3 this admission.  I would hold off on cardiac cath for now and aim for aggressive medical management given elevated creatinine and absence of ACS.  Will repeat HsTnI to ensure it has not risen.  2. Acute on chronic diastolic CHF: NYHA class III symptoms at home. Echo this admission with EF 55-60%, normal RV. Cardiolite  with EF 40%, but I reviewed echo and think that the Cardiolite is inaccurate, EF looks like it is in the 60% range.  She diuresed some initially with IV Lasix but creatinine up to 3.11.  Creatinine down to 2.43 today with CVP 12, she has exertional dyspnea still.   - I will give a dose of Lasix 80 mg IV x 1 today again.    3. Hyperlipidemia: Good LDL and TGs this admission.   - Continue Vascepa.   - Continue Crestor.  4. Obesity: She needs to continue weight loss efforts. 5.Venous insufficiency:  ABIs normal in 3/19. She has history of venous stasis ulcerations that continue to be present.  - Wound consult has seen, recommend unna boot on left.  - Will check venous US to rule out DVT on left.  6. AKI on CKD stage 3.  Baseline creatinine around 2.3, up to 3.1 with diuresis and now 2.43.  CVP 12 this morning.  - Stay off enalapril.  - Will give 1 dose IV Lasix today.  - Follow BMET.  I will have her walk in hall today, work with cardiac rehab.  If she does not have further significant chest pain, possibly home tomorrow. Will need close followup.    Length of Stay: [place], MD  10/05/2018, 9:21 AM  Advanced Heart Failure Team Pager [phone] (M-F; 7a - 4p)  Please contact [facility] for night-coverage after hours (4p -7a ) and weekends on [email or web address]

## 2018-10-05 NOTE — Progress Notes (Signed)
Patient ambulating back from restroom, states "Im having chest pain, I only went to the bathroom". States pain feels like a pressure in her chest. Nitro given x1. Patient states the pain has resolved. Refused 12 lead EKG stating, "Im alright". Patient resting in recliner. No signs of acute distress noted. Nursing will continue to monitor.

## 2018-10-06 LAB — CBC
HCT: 28.6 % — ABNORMAL LOW (ref 36.0–46.0)
Hemoglobin: 9.2 g/dL — ABNORMAL LOW (ref 12.0–15.0)
MCH: 28.5 pg (ref 26.0–34.0)
MCHC: 32.2 g/dL (ref 30.0–36.0)
MCV: 88.5 fL (ref 80.0–100.0)
Platelets: 205 10*3/uL (ref 150–400)
RBC: 3.23 MIL/uL — ABNORMAL LOW (ref 3.87–5.11)
RDW: 12.3 % (ref 11.5–15.5)
WBC: 6.3 10*3/uL (ref 4.0–10.5)
nRBC: 0 % (ref 0.0–0.2)

## 2018-10-06 LAB — BASIC METABOLIC PANEL
Anion gap: 9 (ref 5–15)
BUN: 48 mg/dL — ABNORMAL HIGH (ref 6–20)
CO2: 26 mmol/L (ref 22–32)
Calcium: 9.5 mg/dL (ref 8.9–10.3)
Chloride: 102 mmol/L (ref 98–111)
Creatinine, Ser: 2.56 mg/dL — ABNORMAL HIGH (ref 0.44–1.00)
GFR calc Af Amer: 24 mL/min — ABNORMAL LOW (ref >60)
GFR calc non Af Amer: 20 mL/min — ABNORMAL LOW (ref >60)
Glucose, Bld: 212 mg/dL — ABNORMAL HIGH (ref 70–99)
Potassium: 4.5 mmol/L (ref 3.5–5.1)
Sodium: 137 mmol/L (ref 135–145)

## 2018-10-06 LAB — SARS CORONAVIRUS 2 BY RT PCR (HOSPITAL ORDER, PERFORMED IN ~~LOC~~ HOSPITAL LAB): SARS Coronavirus 2: NEGATIVE

## 2018-10-06 LAB — GLUCOSE, CAPILLARY
Glucose-Capillary: 195 mg/dL — ABNORMAL HIGH (ref 70–99)
Glucose-Capillary: 184 mg/dL — ABNORMAL HIGH (ref 70–99)
Glucose-Capillary: 139 mg/dL — ABNORMAL HIGH (ref 70–99)
Glucose-Capillary: 133 mg/dL — ABNORMAL HIGH (ref 70–99)

## 2018-10-06 MED ORDER — SODIUM CHLORIDE 0.9% FLUSH
3.0000 mL | Freq: Two times a day (BID) | INTRAVENOUS | Status: DC
  Administered 2018-10-06 – 2018-10-09 (×4): 3 mL via INTRAVENOUS

## 2018-10-06 MED ORDER — AMLODIPINE BESYLATE 2.5 MG PO TABS
2.5000 mg | ORAL_TABLET | Freq: Every day | ORAL | Status: DC
  Administered 2018-10-06 – 2018-10-17 (×11): 2.5 mg via ORAL
  Filled 2018-10-06 (×11): qty 1

## 2018-10-06 NOTE — Progress Notes (Signed)
CARDIAC REHAB PHASE I   PRE:  Rate/Rhythm: 78 SR  BP:  Supine:   Sitting: 122/84  Standing:    SaO2: 99%RA  MODE:  Ambulation: 310 ft   POST:  Rate/Rhythm: 96 SR  BP:  Supine:   Sitting: 133/68  Standing:    SaO2: 100%RA 1337-1405 Pt did not have CP prior to walk. Pt walked 310 ft on RA with slow steady gait stopping often to walk. Pt given rollator to use for walk halfway as she began to have CP at 5 on scale and became quiet. Would not answer questions but would shake head to questions. Seemed to be conserving energy. Encouraged pursed lip breathing. Offered for pt to sit on rollator several times but she declined. RN followed [place] to room and offered NTG.  Pt stated CP similar to pain this morning.   Graylon Good, RN BSN  10/06/2018 2:02 PM

## 2018-10-06 NOTE — Progress Notes (Signed)
Pt ambulated in hall approximately 230' on room air, standby assist.  About halfway through the walk, she started to complain of chest pain.  Stopped to rest a few times, but was able to ambulate back to room.  Was diaphoretic and SOB.  One dose of PRN nitroglycerine administered.  Within a few minutes pt stated pain down to 4/10 from 9/10.  Prior to ambulation BP 132/70, HR 82, SpO2 100% RA, CVP 8. Post ambulation BP 171/65, HR 91, SpO2 99% RA, CVP 13.  Pt expresses that she does not have much support at home, son and brother lives with her but not available to help throughout the day.

## 2018-10-06 NOTE — Progress Notes (Signed)
Patient ID: [name], female   DOB: 1965/11/20, 53 y.o.   MRN: [id number]     Advanced Heart Failure Rounding Note  PCP-Cardiologist: No primary care provider on file.   Subjective:    Troponin negative at [facility], HsTnI 76>73>66>>>15 here.   She got 1 dose Lasix 80 mg IV again yesterday, creatinine 3.11 => 2.68 => 2.43 => 2.56.   CVP 13 today.   She continues to have chest pain when she walks in the hall.  Describes it as 9/10.  It starts when she is about 1/2 way down the hall, has happened every time she walks.  It resolves mostly with 1 NTG.  This has occurred despite very aggressive titration of anti-anginals.   - Echo: Technically difficult, EF 55-60%, normal RV size and systolic function, IVC normal.  - Cardiolite: EF 40% (?) with moderate inferior/inferolateral ischemia.  - Venous dopplers: No DVT left leg.   Objective:   Weight Range: (!) 149 kg Body mass index is 51.45 kg/m.   Vital Signs:   Temp:  [98.2 F (36.8 C)-98.5 F (36.9 C)] 98.2 F (36.8 C) (07/16 0830) Pulse Rate:  [79-91] 91 (07/16 0847) Resp:  [11-18] 17 (07/16 0847) BP: (105-171)/(53-90) 171/65 (07/16 0847) SpO2:  [99 %-100 %] 99 % (07/16 0847) Weight:  [149 kg] 149 kg (07/16 0530) Last BM Date: 10/03/18  Weight change: Filed Weights   10/04/18 0500 10/05/18 0427 10/06/18 0530  Weight: (!) 150.5 kg (!) 149.5 kg (!) 149 kg    Intake/Output:   Intake/Output Summary (Last 24 hours) at 10/06/2018 1205 Last data filed at 10/06/2018 1100 Gross per 24 hour  Intake 1568 ml  Output 2100 ml  Net -532 ml      Physical Exam    General: NAD Neck: Thick,  JVP difficult, no thyromegaly or thyroid nodule.  Lungs: Clear to auscultation bilaterally with normal respiratory effort. CV: Nondisplaced PMI.  Heart regular S1/S2, no S3/S4, no murmur.  1+ ankle edema.  No carotid bruit.  Normal pedal pulses.  Abdomen: Soft, nontender, no hepatosplenomegaly, no distention.  Skin: Intact without  lesions or rashes.  Neurologic: Alert and oriented x 3.  Psych: Normal affect. Extremities: No clubbing or cyanosis.  HEENT: Normal.    Telemetry   NSR 70s (personally reviewed).   Labs    CBC Recent Labs    10/05/18 0500 10/06/18 0500  WBC 6.7 6.3  HGB 9.9* 9.2*  HCT 30.6* 28.6*  MCV 87.2 88.5  PLT 226 026   Basic Metabolic Panel Recent Labs    10/05/18 0500 10/06/18 0500  NA 135 137  K 4.7 4.5  CL 101 102  CO2 25 26  GLUCOSE 192* 212*  BUN 48* 48*  CREATININE 2.43* 2.56*  CALCIUM 9.8 9.5   Liver Function Tests No results for input(s): AST, ALT, ALKPHOS, BILITOT, PROT, ALBUMIN in the last 72 hours. No results for input(s): LIPASE, AMYLASE in the last 72 hours. Cardiac Enzymes No results for input(s): CKTOTAL, CKMB, CKMBINDEX, TROPONINI in the last 72 hours.  BNP: BNP (last 3 results) Recent Labs    10/01/18 2330  BNP 203.9*    ProBNP (last 3 results) No results for input(s): PROBNP in the last 8760 hours.   D-Dimer No results for input(s): DDIMER in the last 72 hours. Hemoglobin A1C No results for input(s): HGBA1C in the last 72 hours. Fasting Lipid Panel No results for input(s): CHOL, HDL, LDLCALC, TRIG, CHOLHDL, LDLDIRECT in the last 72 hours. Thyroid  Function Tests No results for input(s): TSH, T4TOTAL, T3FREE, THYROIDAB in the last 72 hours.  Invalid input(s): FREET3  Other results:   Imaging    Vas [place] Lower Extremity Venous (dvt)  Result Date: 10/05/2018  Lower Venous Study Indications: Edema.  Risk Factors: Morbid obesity 149.5 KG. Limitations: Body habitus. Performing Technologist: Toma Copier RVS  Examination Guidelines: A complete evaluation includes B-mode imaging, spectral Doppler, color Doppler, and power Doppler as needed of all accessible portions of each vessel. Bilateral testing is considered an integral part of a complete examination. Limited examinations for reoccurring indications may be performed as noted.   +-----+---------------+---------+-----------+----------+-------+  RIGHT Compressibility Phasicity Spontaneity Properties Summary  +-----+---------------+---------+-----------+----------+-------+  CFV   Full            Yes       Yes                             +-----+---------------+---------+-----------+----------+-------+  SFJ   Full                                                      +-----+---------------+---------+-----------+----------+-------+   +---------+---------------+---------+-----------+----------+------------------+  LEFT      Compressibility Phasicity Spontaneity Properties Summary             +---------+---------------+---------+-----------+----------+------------------+  CFV       Full            Yes       Yes                                        +---------+---------------+---------+-----------+----------+------------------+  SFJ       Full                                                                 +---------+---------------+---------+-----------+----------+------------------+  FV Prox   Full            Yes       Yes                                        +---------+---------------+---------+-----------+----------+------------------+  FV Mid    Full                                                                 +---------+---------------+---------+-----------+----------+------------------+  FV Distal Full            Yes       Yes                                        +---------+---------------+---------+-----------+----------+------------------+  PFV       Full            Yes       Yes                                        +---------+---------------+---------+-----------+----------+------------------+  POP       Full            Yes       Yes                                        +---------+---------------+---------+-----------+----------+------------------+  PTV       Full                                                                  +---------+---------------+---------+-----------+----------+------------------+  PERO                                                       Limited visability  +---------+---------------+---------+-----------+----------+------------------+   Left Technical Findings: Not visualized segments include Peroneal. Limited visabilty due to body habitus and texture of the skin.   Summary: Right: There is no evidence of a common femoral vein ovstruction. Left: There is no evidence of deep vein thrombosis in the lower extremity. However, portions of this examination were limited- see technologist comments above.  *See table(s) above for measurements and observations.    Preliminary      Medications:     Scheduled Medications:  aspirin EC  81 mg Oral Daily   carvedilol  25 mg Oral BID WC   citalopram  20 mg Oral Daily   citalopram  30 mg Oral QHS   febuxostat  80 mg Oral Daily   fenofibrate  160 mg Oral Daily   gabapentin  100 mg Oral 2 times per day   gabapentin  200 mg Oral QHS   heparin injection (subcutaneous)  5,000 Units Subcutaneous Q8H   insulin aspart  0-15 Units Subcutaneous TID WC   insulin aspart  12 Units Subcutaneous TID WC   insulin glargine  50 Units Subcutaneous BID   isosorbide mononitrate  120 mg Oral Daily   LORazepam  0.5 mg Oral Q8H   omega-3 acid ethyl esters  2 g Oral BID   pneumococcal 23 valent vaccine  0.5 mL Intramuscular Tomorrow-1000   prasugrel  10 mg Oral q morning - 10a   ranolazine  1,000 mg Oral BID   rosuvastatin  5 mg Oral Daily   sodium chloride flush  10-40 mL Intracatheter Q12H   sodium chloride flush  3 mL Intravenous Q12H    Infusions:   PRN Medications: alum & mag hydroxide-simeth, nitroGLYCERIN, ondansetron (ZOFRAN) IV, oxyCODONE, sodium chloride flush   Assessment/Plan   1. CAD: Very aggressive coronary disease. Had 4 more drug-eluting stents placed in 12/11, 1 in the SVG-LAD and 3 to open the totally occluded native RCA  after  occlusion of the SVG-PDA. She was deemed not to be a candidate for re-do CABG at that admission by cardiac surgery. Unfortunately, due to her body habitus, the surgeons were unable to mobilize her LIMA for grafting during her initial CABG operation. Lexiscan Cardiolite in 3/14 showed no ischemia or infarction though study was limited by body habitus. She has continued to have chest pain episodes, including the episode that brought her to the hospital on 7/11.  HsTnI was mildly elevated with no trend, seemed more consistent with demand ischemia from volume overload, repeat HsTnI yesterday was 15.   Her Cardiolite showed a moderate area of inferior ischemia.  I have tried to manage her very aggressively with anti-anginal meds given her CKD.  However, she still has significant angina (9/10 chest pain) with a short walkin the hall.  Pain resolves mostly with NTG.  She tells me that "I cannot live like this."   - Unfortunately, I think that I may end up having to cath her given the significance of her symptoms.  We had a long discussion about the risk of worsening renal failure and even HD from contrast dye.  I told her that I will start her on gentle IV fluids this evening and give her no Lasix today. If creatinine trends down and she continues to have chest pain with exertion (asked her to walk this afternoon after getting all her meds), then can proceed with angiography tomorrow trying to limit contrast as much as possible. If creatinine is higher, I am not going to cath her tomorrow in absence of ACS.  - Continue ASA, statin, Vascepa.  - Continue Effient long-term since she is a poor Plavix responder and has multiple DES.   -Continue ranolazine 1000 mg bid.   - Continue Imdur 120 mg daily.  - Continue Coreg 25 mg bid.  - She has BP room, will add amlodipine 2.5 mg daily.  2. Acute on chronic diastolic CHF: NYHA class III symptoms at home. Echo this admission with EF 55-60%, normal RV. Cardiolite  with EF 40%, but I reviewed echo and think that the Cardiolite is inaccurate, EF looks like it is in the 60% range.  She diuresed some initially with IV Lasix but creatinine up to 3.11.  Creatinine 2.56 today with CVP 13, she has exertional dyspnea and chest pain still.   - No Lasix today as she may have a cath tomorrow.  She will need IV fluid overnight, will be gentle given diastolic CHF.  - RHC with cath tomorrow, will need left brachial IV.  3. Hyperlipidemia: Good LDL and TGs this admission.   - Continue Vascepa.   - Continue Crestor.  4. Obesity: She needs to continue weight loss efforts. 5.Venous insufficiency:  ABIs normal in 3/19. She has history of venous stasis ulcerations that continue to be present. No DVT on left on venous dopplers.  - Wound consult has seen, recommend unna boot on left.  6. AKI on CKD stage 3.  Baseline creatinine around 2.3, up to 3.1 with diuresis and now 2.43 => 2.56.  CVP 13 this morning.  - Stay off enalapril.  - No Lasix today and gentle hydration with possible cath.  I had a long discussion with her about risk of worsening renal function with contrast, but she continues to tell me that "I can't live like this" with regard to the chest pain.   Length of Stay: [place], MD  10/06/2018, 12:05 PM  Advanced Heart Failure  Team Pager [phone] (M-F; [place])  Please contact [facility] for night-coverage after hours (4p -7a ) and weekends on [email or web address]

## 2018-10-06 NOTE — Progress Notes (Addendum)
1120 Read RN's well documented note of CP with short distance and need for NTG. Will hold ambulation until seen by cardiology. Will continue to follow. Graylon Good RN BSN 10/06/2018 11:22 AM

## 2018-10-06 NOTE — Progress Notes (Signed)
MD placed care order instruction for pt to have L brachial IV for RHC tomorrow.  IV team consult placed, however, per IV team RN Morey Hummingbird, they advise holding start until closer to time of heart cath.  No scheduled time in cath lab as of right now.  Will pass off to night shift that pt will need new start early tomorrow a.m.

## 2018-10-07 ENCOUNTER — Inpatient Hospital Stay (HOSPITAL_COMMUNITY)
Admission: AD | Disposition: A | Discharge status: Home / Self Care | Payer: Self-pay | Source: Other Acute Inpatient Hospital | Attending: Cardiology

## 2018-10-07 DIAGNOSIS — I509 Heart failure, unspecified: Secondary | ICD-10-CM

## 2018-10-07 DIAGNOSIS — I25110 Atherosclerotic heart disease of native coronary artery with unstable angina pectoris: Secondary | ICD-10-CM

## 2018-10-07 HISTORY — PX: RIGHT/LEFT HEART CATH AND CORONARY ANGIOGRAPHY: CATH118266

## 2018-10-07 LAB — POCT I-STAT EG7
Acid-base deficit: 1 mmol/L (ref 0.0–2.0)
Acid-base deficit: 3 mmol/L — ABNORMAL HIGH (ref 0.0–2.0)
Bicarbonate: 22.3 mmol/L (ref 20.0–28.0)
Bicarbonate: 25.2 mmol/L (ref 20.0–28.0)
Calcium, Ion: 1.11 mmol/L — ABNORMAL LOW (ref 1.15–1.40)
Calcium, Ion: 1.33 mmol/L (ref 1.15–1.40)
HCT: 25 % — ABNORMAL LOW (ref 36.0–46.0)
HCT: 28 % — ABNORMAL LOW (ref 36.0–46.0)
Hemoglobin: 8.5 g/dL — ABNORMAL LOW (ref 12.0–15.0)
Hemoglobin: 9.5 g/dL — ABNORMAL LOW (ref 12.0–15.0)
O2 Saturation: 68 %
O2 Saturation: 68 %
Potassium: 3.8 mmol/L (ref 3.5–5.1)
Potassium: 4.4 mmol/L (ref 3.5–5.1)
Sodium: 140 mmol/L (ref 135–145)
Sodium: 144 mmol/L (ref 135–145)
TCO2: 24 mmol/L (ref 22–32)
TCO2: 27 mmol/L (ref 22–32)
pCO2, Ven: 41.5 mmHg — ABNORMAL LOW (ref 44.0–60.0)
pCO2, Ven: 45.6 mmHg (ref 44.0–60.0)
pH, Ven: 7.337 (ref 7.250–7.430)
pH, Ven: 7.349 (ref 7.250–7.430)
pO2, Ven: 37 mmHg (ref 32.0–45.0)
pO2, Ven: 38 mmHg (ref 32.0–45.0)

## 2018-10-07 LAB — GLUCOSE, CAPILLARY
Glucose-Capillary: 137 mg/dL — ABNORMAL HIGH (ref 70–99)
Glucose-Capillary: 85 mg/dL (ref 70–99)
Glucose-Capillary: 109 mg/dL — ABNORMAL HIGH (ref 70–99)
Glucose-Capillary: 100 mg/dL — ABNORMAL HIGH (ref 70–99)
Glucose-Capillary: 161 mg/dL — ABNORMAL HIGH (ref 70–99)

## 2018-10-07 LAB — BASIC METABOLIC PANEL
Anion gap: 7 (ref 5–15)
BUN: 43 mg/dL — ABNORMAL HIGH (ref 6–20)
CO2: 25 mmol/L (ref 22–32)
Calcium: 9.3 mg/dL (ref 8.9–10.3)
Chloride: 103 mmol/L (ref 98–111)
Creatinine, Ser: 2.44 mg/dL — ABNORMAL HIGH (ref 0.44–1.00)
GFR calc Af Amer: 25 mL/min — ABNORMAL LOW (ref >60)
GFR calc non Af Amer: 22 mL/min — ABNORMAL LOW (ref >60)
Glucose, Bld: 225 mg/dL — ABNORMAL HIGH (ref 70–99)
Potassium: 4.4 mmol/L (ref 3.5–5.1)
Sodium: 135 mmol/L (ref 135–145)

## 2018-10-07 LAB — CBC
HCT: 27.1 % — ABNORMAL LOW (ref 36.0–46.0)
HCT: 27.2 % — ABNORMAL LOW (ref 36.0–46.0)
Hemoglobin: 8.8 g/dL — ABNORMAL LOW (ref 12.0–15.0)
Hemoglobin: 9 g/dL — ABNORMAL LOW (ref 12.0–15.0)
MCH: 28.9 pg (ref 26.0–34.0)
MCH: 28.7 pg (ref 26.0–34.0)
MCHC: 32.5 g/dL (ref 30.0–36.0)
MCHC: 33.1 g/dL (ref 30.0–36.0)
MCV: 89.1 fL (ref 80.0–100.0)
MCV: 86.6 fL (ref 80.0–100.0)
Platelets: 198 10*3/uL (ref 150–400)
Platelets: 183 10*3/uL (ref 150–400)
RBC: 3.04 MIL/uL — ABNORMAL LOW (ref 3.87–5.11)
RBC: 3.14 MIL/uL — ABNORMAL LOW (ref 3.87–5.11)
RDW: 12.5 % (ref 11.5–15.5)
RDW: 12.5 % (ref 11.5–15.5)
WBC: 6.8 10*3/uL (ref 4.0–10.5)
WBC: 5.8 10*3/uL (ref 4.0–10.5)
nRBC: 0.4 % — ABNORMAL HIGH (ref 0.0–0.2)
nRBC: 0.5 % — ABNORMAL HIGH (ref 0.0–0.2)

## 2018-10-07 LAB — CREATININE, SERUM
Creatinine, Ser: 2.1 mg/dL — ABNORMAL HIGH (ref 0.44–1.00)
GFR calc Af Amer: 30 mL/min — ABNORMAL LOW (ref >60)
GFR calc non Af Amer: 26 mL/min — ABNORMAL LOW (ref >60)

## 2018-10-07 SURGERY — RIGHT/LEFT HEART CATH AND CORONARY ANGIOGRAPHY
Anesthesia: LOCAL

## 2018-10-07 MED ORDER — HEPARIN SODIUM (PORCINE) 1000 UNIT/ML IJ SOLN
INTRAMUSCULAR | Status: AC
  Filled 2018-10-07: qty 1

## 2018-10-07 MED ORDER — SODIUM CHLORIDE 0.9 % IV SOLN
INTRAVENOUS | Status: AC
  Administered 2018-10-07: 04:00:00 via INTRAVENOUS

## 2018-10-07 MED ORDER — FENTANYL CITRATE (PF) 100 MCG/2ML IJ SOLN
INTRAMUSCULAR | Status: DC | PRN
  Administered 2018-10-07 (×4): 25 ug via INTRAVENOUS

## 2018-10-07 MED ORDER — LIDOCAINE HCL (PF) 1 % IJ SOLN
INTRAMUSCULAR | Status: AC
  Filled 2018-10-07: qty 30

## 2018-10-07 MED ORDER — IOHEXOL 350 MG/ML SOLN
INTRAVENOUS | Status: DC | PRN
  Administered 2018-10-07: 15:00:00 35 mL via INTRACARDIAC

## 2018-10-07 MED ORDER — MIDAZOLAM HCL 2 MG/2ML IJ SOLN
INTRAMUSCULAR | Status: DC | PRN
  Administered 2018-10-07 (×2): 1 mg via INTRAVENOUS

## 2018-10-07 MED ORDER — LIDOCAINE HCL (PF) 1 % IJ SOLN
INTRAMUSCULAR | Status: DC | PRN
  Administered 2018-10-07: 2 mL
  Administered 2018-10-07: 15 mL

## 2018-10-07 MED ORDER — LABETALOL HCL 5 MG/ML IV SOLN: 10.0000 mg | INTRAVENOUS | Status: AC | PRN

## 2018-10-07 MED ORDER — HEPARIN SODIUM (PORCINE) 5000 UNIT/ML IJ SOLN
5000.0000 [IU] | Freq: Three times a day (TID) | INTRAMUSCULAR | Status: DC
  Administered 2018-10-07 – 2018-10-11 (×11): 5000 [IU] via SUBCUTANEOUS
  Filled 2018-10-07 (×11): qty 1

## 2018-10-07 MED ORDER — SODIUM CHLORIDE 0.9% FLUSH: 3.0000 mL | INTRAVENOUS | Status: DC | PRN

## 2018-10-07 MED ORDER — HEPARIN (PORCINE) IN NACL 1000-0.9 UT/500ML-% IV SOLN
INTRAVENOUS | Status: AC
  Filled 2018-10-07: qty 1000

## 2018-10-07 MED ORDER — ONDANSETRON HCL 4 MG/2ML IJ SOLN
4.0000 mg | Freq: Four times a day (QID) | INTRAMUSCULAR | Status: DC | PRN
  Administered 2018-10-08 – 2018-10-11 (×2): 4 mg via INTRAVENOUS
  Filled 2018-10-07: qty 2

## 2018-10-07 MED ORDER — SODIUM CHLORIDE 0.9 % IV SOLN: 250.0000 mL | INTRAVENOUS | Status: DC | PRN

## 2018-10-07 MED ORDER — ACETAMINOPHEN 325 MG PO TABS: 650.0000 mg | ORAL_TABLET | ORAL | Status: DC | PRN

## 2018-10-07 MED ORDER — ASPIRIN 81 MG PO CHEW
81.0000 mg | CHEWABLE_TABLET | ORAL | Status: AC
  Administered 2018-10-07: 05:00:00 81 mg via ORAL
  Filled 2018-10-07: qty 1

## 2018-10-07 MED ORDER — SODIUM CHLORIDE 0.9% FLUSH
3.0000 mL | Freq: Two times a day (BID) | INTRAVENOUS | Status: DC
  Administered 2018-10-08 – 2018-10-10 (×4): 3 mL via INTRAVENOUS

## 2018-10-07 MED ORDER — MIDAZOLAM HCL 2 MG/2ML IJ SOLN
INTRAMUSCULAR | Status: AC
  Filled 2018-10-07: qty 2

## 2018-10-07 MED ORDER — SODIUM CHLORIDE 0.9 % IV SOLN: INTRAVENOUS | Status: AC

## 2018-10-07 MED ORDER — SODIUM CHLORIDE 0.9% FLUSH
3.0000 mL | INTRAVENOUS | Status: DC | PRN
  Administered 2018-10-08: 3 mL via INTRAVENOUS
  Filled 2018-10-07: qty 3

## 2018-10-07 MED ORDER — FENTANYL CITRATE (PF) 100 MCG/2ML IJ SOLN
INTRAMUSCULAR | Status: AC
  Filled 2018-10-07: qty 2

## 2018-10-07 MED ORDER — HYDRALAZINE HCL 20 MG/ML IJ SOLN: 10.0000 mg | INTRAMUSCULAR | Status: AC | PRN

## 2018-10-07 MED ORDER — VERAPAMIL HCL 2.5 MG/ML IV SOLN
INTRAVENOUS | Status: AC
  Filled 2018-10-07: qty 2

## 2018-10-07 MED ORDER — HEPARIN (PORCINE) IN NACL 1000-0.9 UT/500ML-% IV SOLN
INTRAVENOUS | Status: DC | PRN
  Administered 2018-10-07 (×2): 500 mL

## 2018-10-07 SURGICAL SUPPLY — 8 items
CATH BALLN WEDGE 5F 110CM (CATHETERS) ×1 IMPLANT
CATH INFINITI 5FR MULTPACK ANG (CATHETERS) ×1 IMPLANT
COVER DOME SNAP 22 D (MISCELLANEOUS) ×1 IMPLANT
PACK CARDIAC CATHETERIZATION (CUSTOM PROCEDURE TRAY) ×2 IMPLANT
SHEATH GLIDE SLENDER 4/5FR (SHEATH) ×1 IMPLANT
SHEATH PINNACLE 5F 10CM (SHEATH) ×1 IMPLANT
TRANSDUCER W/STOPCOCK (MISCELLANEOUS) ×2 IMPLANT
WIRE EMERALD 3MM-J .035X150CM (WIRE) ×1 IMPLANT

## 2018-10-07 NOTE — Progress Notes (Signed)
Site area: rt fa sheath Site Prior to Removal:  Level 0 Pressure Applied For: 20 minutes Manual:   yes Patient Status During Pull:  stable Post Pull Site:  Level 0 Post Pull Instructions Given:  yes Post Pull Pulses Present: rt dp dopplered Dressing Applied:  Gauze and tegaderm Bedrest begins @ [id number] Comments:

## 2018-10-07 NOTE — Care Management Important Message (Signed)
Important Message  Patient Details  Name: [name] MRN: [id number] Date of Birth: Aug 29, 1965   Medicare Important Message Given:  Yes     Shelda Altes 10/07/2018, 12:10 PM

## 2018-10-07 NOTE — Progress Notes (Signed)
Patient ID: [name], female   DOB: Apr 01, 1965, 53 y.o.   MRN: [id number]     Advanced Heart Failure Rounding Note  PCP-Cardiologist: No primary care provider on file.   Subjective:    Troponin negative at [facility], HsTnI 76>73>66>>>15 here.   No Lasix yesterday and had hydration in advance of possible cardiac cath, creatinine 3.11 => 2.68 => 2.43 => 2.56 => 2.44.   CVP 12 today. Good UOP yesterday.   She continues to have chest pain/pressure when she walks in the hall.  Describes it as 9/10.  She had 4 episodes resolving with rest walking to the nursing station today after all her anti-anginal meds.  This has occurred despite very aggressive titration of anti-anginals.   - Echo: Technically difficult, EF 55-60%, normal RV size and systolic function, IVC normal.  - Cardiolite: EF 40% (?) with moderate inferior/inferolateral ischemia.  - Venous dopplers: No DVT left leg.   Objective:   Weight Range: (!) 150.2 kg Body mass index is 51.86 kg/m.   Vital Signs:   Temp:  [98 F (36.7 C)-98.4 F (36.9 C)] 98.3 F (36.8 C) (07/17 0856) Pulse Rate:  [74-81] 77 (07/17 0856) Resp:  [11-17] 17 (07/17 0856) BP: (122-141)/(54-68) 141/54 (07/17 0856) SpO2:  [99 %-100 %] 100 % (07/17 0856) Weight:  [150.2 kg] 150.2 kg (07/17 0500) Last BM Date: 10/06/18  Weight change: Filed Weights   10/05/18 0427 10/06/18 0530 10/07/18 0500  Weight: (!) 149.5 kg (!) 149 kg (!) 150.2 kg    Intake/Output:   Intake/Output Summary (Last 24 hours) at 10/07/2018 1359 Last data filed at 10/07/2018 1300 Gross per 24 hour  Intake 1595.09 ml  Output 3850 ml  Net -2254.91 ml      Physical Exam    General: NAD Neck: Thick, JVP difficult, no thyromegaly or thyroid nodule.  Lungs: Clear to auscultation bilaterally with normal respiratory effort. CV: Nonpalpable PMI.  Heart regular S1/S2, no S3/S4, no murmur.  1+ ankle edema.   Abdomen: Soft, nontender, no hepatosplenomegaly, no distention.   Skin: Intact without lesions or rashes.  Neurologic: Alert and oriented x 3.  Psych: Normal affect. Extremities: No clubbing or cyanosis.  HEENT: Normal.    Telemetry   NSR 70s (personally reviewed).   Labs    CBC Recent Labs    10/06/18 0500 10/07/18 0317  WBC 6.3 6.8  HGB 9.2* 8.8*  HCT 28.6* 27.1*  MCV 88.5 89.1  PLT 205 621   Basic Metabolic Panel Recent Labs    10/06/18 0500 10/07/18 0317  NA 137 135  K 4.5 4.4  CL 102 103  CO2 26 25  GLUCOSE 212* 225*  BUN 48* 43*  CREATININE 2.56* 2.44*  CALCIUM 9.5 9.3   Liver Function Tests No results for input(s): AST, ALT, ALKPHOS, BILITOT, PROT, ALBUMIN in the last 72 hours. No results for input(s): LIPASE, AMYLASE in the last 72 hours. Cardiac Enzymes No results for input(s): CKTOTAL, CKMB, CKMBINDEX, TROPONINI in the last 72 hours.  BNP: BNP (last 3 results) Recent Labs    10/01/18 2330  BNP 203.9*    ProBNP (last 3 results) No results for input(s): PROBNP in the last 8760 hours.   D-Dimer No results for input(s): DDIMER in the last 72 hours. Hemoglobin A1C No results for input(s): HGBA1C in the last 72 hours. Fasting Lipid Panel No results for input(s): CHOL, HDL, LDLCALC, TRIG, CHOLHDL, LDLDIRECT in the last 72 hours. Thyroid Function Tests No results for input(s): TSH,  T4TOTAL, T3FREE, THYROIDAB in the last 72 hours.  Invalid input(s): FREET3  Other results:   Imaging    No results found.   Medications:     Scheduled Medications: . [MAR Hold] amLODipine  2.5 mg Oral Daily  . [MAR Hold] aspirin EC  81 mg Oral Daily  . [MAR Hold] carvedilol  25 mg Oral BID WC  . [MAR Hold] citalopram  20 mg Oral Daily  . [MAR Hold] citalopram  30 mg Oral QHS  . [MAR Hold] febuxostat  80 mg Oral Daily  . [MAR Hold] fenofibrate  160 mg Oral Daily  . [MAR Hold] gabapentin  100 mg Oral 2 times per day  . [MAR Hold] gabapentin  200 mg Oral QHS  . [MAR Hold] heparin injection (subcutaneous)  5,000  Units Subcutaneous Q8H  . [MAR Hold] insulin aspart  0-15 Units Subcutaneous TID WC  . [MAR Hold] insulin aspart  12 Units Subcutaneous TID WC  . [MAR Hold] insulin glargine  50 Units Subcutaneous BID  . [MAR Hold] isosorbide mononitrate  120 mg Oral Daily  . [MAR Hold] LORazepam  0.5 mg Oral Q8H  . [MAR Hold] omega-3 acid ethyl esters  2 g Oral BID  . [MAR Hold] pneumococcal 23 valent vaccine  0.5 mL Intramuscular Tomorrow-1000  . [MAR Hold] prasugrel  10 mg Oral q morning - 10a  . [MAR Hold] ranolazine  1,000 mg Oral BID  . [MAR Hold] rosuvastatin  5 mg Oral Daily  . [MAR Hold] sodium chloride flush  10-40 mL Intracatheter Q12H  . [MAR Hold] sodium chloride flush  3 mL Intravenous Q12H    Infusions: . sodium chloride    . sodium chloride 75 mL/hr at 10/07/18 0900    PRN Medications: sodium chloride, [MAR Hold] alum & mag hydroxide-simeth, [MAR Hold] nitroGLYCERIN, [MAR Hold] ondansetron (ZOFRAN) IV, [MAR Hold] oxyCODONE, [MAR Hold] sodium chloride flush, sodium chloride flush   Assessment/Plan   1. CAD: Very aggressive coronary disease. Had 4 more drug-eluting stents placed in 12/11, 1 in the SVG-LAD and 3 to open the totally occluded native RCA after occlusion of the SVG-PDA. She was deemed not to be a candidate for re-do CABG at that admission by cardiac surgery. Unfortunately, due to her body habitus, the surgeons were unable to mobilize her LIMA for grafting during her initial CABG operation. Lexiscan Cardiolite in 3/14 showed no ischemia or infarction though study was limited by body habitus. She has continued to have chest pain episodes, including the episode that brought her to the hospital on 7/11.  HsTnI was mildly elevated with no trend, seemed more consistent with demand ischemia from volume overload, repeat HsTnI yesterday was 15.   Her Cardiolite showed a moderate area of inferior ischemia.  I have tried to manage her very aggressively with anti-anginal meds given her CKD.   However, she still has significant angina (9/10 chest pain) with a short walk in the hall (recurred today).  Pain resolves mostly with NTG or rest.  She tells me that "I cannot live like this."   - Unfortunately, I think that I need to cath her given the significance of her symptoms.  We had a long discussion again today about the risk of worsening renal failure and even HD from contrast dye.  No Lasix today or yesterday, and she was hydrated starting last night. Creatinine is mildly lower today, I think that she is at her baseline.  After discussion, she wants the catheterization today and understands  the risks.  - Continue ASA, statin, Vascepa.  - Continue Effient long-term since she is a poor Plavix responder and has multiple DES.   -Continue ranolazine 1000 mg bid.   - Continue Imdur 120 mg daily.  - Continue Coreg 25 mg bid.  - Continue amlodipine 2.5 mg daily.   2. Acute on chronic diastolic CHF: NYHA class III symptoms at home. Echo this admission with EF 55-60%, normal RV. Cardiolite with EF 40%, but I reviewed echo and think that the Cardiolite is inaccurate, EF looks like it is in the 60% range.  She diuresed some initially with IV Lasix but creatinine up to 3.11.  Creatinine 2.44 today with CVP 12, she has exertional dyspnea and chest pain still.   - No Lasix today pre-cath and she has had IV fluid.  Will likely need Lasix IV over the weekend.   - RHC with cath, will need left brachial IV.  3. Hyperlipidemia: Good LDL and TGs this admission.   - Continue Vascepa.   - Continue Crestor.  4. Obesity: She needs to continue weight loss efforts. 5.Venous insufficiency:  ABIs normal in 3/19. She has history of venous stasis ulcerations that continue to be present. No DVT on left on venous dopplers.  - Wound consult has seen, recommend unna boot on left.  6. AKI on CKD stage 3.  Baseline creatinine around 2.3, up to 3.1 with diuresis and now 2.43 => 2.56 => 2.44.  CVP 12 this morning.  -  Stay off enalapril.   Length of Stay: 6  Loralie Champagne, MD  10/07/2018, 1:59 PM  Advanced Heart Failure Team Pager [phone] (M-F; 7a - 4p)  Please contact [facility] for night-coverage after hours (4p -7a ) and weekends on [email or web address]

## 2018-10-07 NOTE — Interval H&P Note (Signed)
History and Physical Interval Note:  10/07/2018 2:05 PM  [name]  has presented today for surgery, with the diagnosis of heart failure.  The various methods of treatment have been discussed with the patient and family. After consideration of risks, benefits and other options for treatment, the patient has consented to  Procedure(s): RIGHT/LEFT HEART CATH AND CORONARY ANGIOGRAPHY (N/A) as a surgical intervention.  The patient's history has been reviewed, patient examined, no change in status, stable for surgery.  I have reviewed the patient's chart and labs.  Questions were answered to the patient's satisfaction.     Hildreth Orsak [employer]

## 2018-10-07 NOTE — Progress Notes (Signed)
Placed IV consult for second IV per MD order for R heart cath today. IV team flushed and redressed current L AC IV patient currently has. Pt also has R upper arm double lumen PICC. Said that current IV was okay to use per order as well as PICC for procedure. Lajoyce Corners, RN

## 2018-10-07 NOTE — Progress Notes (Signed)
Pt refusing po meds this am due to not being able to eat.  Pt encouraged to take cardiac meds as requested by her physician to which she complies.

## 2018-10-07 NOTE — Progress Notes (Signed)
Pt on bedrest and NPO post cath this evening. Pt ordered dinner tray but didn't eat until 2100. Pt given SSI to cover her meal even though ordered earlier but not given. Pt CBG=161 and given 3 units Novolog in addition to her bedtime Lantus 50 units. Will continue to monitor. Lajoyce Corners, RN

## 2018-10-07 NOTE — Progress Notes (Signed)
Pt ambulated 400 ft in hallway.  Pt stopped during walk several times because she was experiencing "pressure" in her chest which she feared would progress to pain.  Will continue to monitor.

## 2018-10-07 NOTE — H&P (View-Only) (Signed)
Patient ID: [name], female   DOB: 1965/05/09, 53 y.o.   MRN: [id number]     Advanced Heart Failure Rounding Note  PCP-Cardiologist: No primary care provider on file.   Subjective:    Troponin negative at [facility], HsTnI 76>73>66>>>15 here.   No Lasix yesterday and had hydration in advance of possible cardiac cath, creatinine 3.11 => 2.68 => 2.43 => 2.56 => 2.44.   CVP 12 today. Good UOP yesterday.   She continues to have chest pain/pressure when she walks in the hall.  Describes it as 9/10.  She had 4 episodes resolving with rest walking to the nursing station today after all her anti-anginal meds.  This has occurred despite very aggressive titration of anti-anginals.   - Echo: Technically difficult, EF 55-60%, normal RV size and systolic function, IVC normal.  - Cardiolite: EF 40% (?) with moderate inferior/inferolateral ischemia.  - Venous dopplers: No DVT left leg.   Objective:   Weight Range: (!) 150.2 kg Body mass index is 51.86 kg/m.   Vital Signs:   Temp:  [98 F (36.7 C)-98.4 F (36.9 C)] 98.3 F (36.8 C) (07/17 0856) Pulse Rate:  [74-81] 77 (07/17 0856) Resp:  [11-17] 17 (07/17 0856) BP: (122-141)/(54-68) 141/54 (07/17 0856) SpO2:  [99 %-100 %] 100 % (07/17 0856) Weight:  [150.2 kg] 150.2 kg (07/17 0500) Last BM Date: 10/06/18  Weight change: Filed Weights   10/05/18 0427 10/06/18 0530 10/07/18 0500  Weight: (!) 149.5 kg (!) 149 kg (!) 150.2 kg    Intake/Output:   Intake/Output Summary (Last 24 hours) at 10/07/2018 1359 Last data filed at 10/07/2018 1300 Gross per 24 hour  Intake 1595.09 ml  Output 3850 ml  Net -2254.91 ml      Physical Exam    General: NAD Neck: Thick, JVP difficult, no thyromegaly or thyroid nodule.  Lungs: Clear to auscultation bilaterally with normal respiratory effort. CV: Nonpalpable PMI.  Heart regular S1/S2, no S3/S4, no murmur.  1+ ankle edema.   Abdomen: Soft, nontender, no hepatosplenomegaly, no distention.   Skin: Intact without lesions or rashes.  Neurologic: Alert and oriented x 3.  Psych: Normal affect. Extremities: No clubbing or cyanosis.  HEENT: Normal.    Telemetry   NSR 70s (personally reviewed).   Labs    CBC Recent Labs    10/06/18 0500 10/07/18 0317  WBC 6.3 6.8  HGB 9.2* 8.8*  HCT 28.6* 27.1*  MCV 88.5 89.1  PLT 205 528   Basic Metabolic Panel Recent Labs    10/06/18 0500 10/07/18 0317  NA 137 135  K 4.5 4.4  CL 102 103  CO2 26 25  GLUCOSE 212* 225*  BUN 48* 43*  CREATININE 2.56* 2.44*  CALCIUM 9.5 9.3   Liver Function Tests No results for input(s): AST, ALT, ALKPHOS, BILITOT, PROT, ALBUMIN in the last 72 hours. No results for input(s): LIPASE, AMYLASE in the last 72 hours. Cardiac Enzymes No results for input(s): CKTOTAL, CKMB, CKMBINDEX, TROPONINI in the last 72 hours.  BNP: BNP (last 3 results) Recent Labs    10/01/18 2330  BNP 203.9*    ProBNP (last 3 results) No results for input(s): PROBNP in the last 8760 hours.   D-Dimer No results for input(s): DDIMER in the last 72 hours. Hemoglobin A1C No results for input(s): HGBA1C in the last 72 hours. Fasting Lipid Panel No results for input(s): CHOL, HDL, LDLCALC, TRIG, CHOLHDL, LDLDIRECT in the last 72 hours. Thyroid Function Tests No results for input(s): TSH,  T4TOTAL, T3FREE, THYROIDAB in the last 72 hours.  Invalid input(s): FREET3  Other results:   Imaging    No results found.   Medications:     Scheduled Medications: . [MAR Hold] amLODipine  2.5 mg Oral Daily  . [MAR Hold] aspirin EC  81 mg Oral Daily  . [MAR Hold] carvedilol  25 mg Oral BID WC  . [MAR Hold] citalopram  20 mg Oral Daily  . [MAR Hold] citalopram  30 mg Oral QHS  . [MAR Hold] febuxostat  80 mg Oral Daily  . [MAR Hold] fenofibrate  160 mg Oral Daily  . [MAR Hold] gabapentin  100 mg Oral 2 times per day  . [MAR Hold] gabapentin  200 mg Oral QHS  . [MAR Hold] heparin injection (subcutaneous)  5,000  Units Subcutaneous Q8H  . [MAR Hold] insulin aspart  0-15 Units Subcutaneous TID WC  . [MAR Hold] insulin aspart  12 Units Subcutaneous TID WC  . [MAR Hold] insulin glargine  50 Units Subcutaneous BID  . [MAR Hold] isosorbide mononitrate  120 mg Oral Daily  . [MAR Hold] LORazepam  0.5 mg Oral Q8H  . [MAR Hold] omega-3 acid ethyl esters  2 g Oral BID  . [MAR Hold] pneumococcal 23 valent vaccine  0.5 mL Intramuscular Tomorrow-1000  . [MAR Hold] prasugrel  10 mg Oral q morning - 10a  . [MAR Hold] ranolazine  1,000 mg Oral BID  . [MAR Hold] rosuvastatin  5 mg Oral Daily  . [MAR Hold] sodium chloride flush  10-40 mL Intracatheter Q12H  . [MAR Hold] sodium chloride flush  3 mL Intravenous Q12H    Infusions: . sodium chloride    . sodium chloride 75 mL/hr at 10/07/18 0900    PRN Medications: sodium chloride, [MAR Hold] alum & mag hydroxide-simeth, [MAR Hold] nitroGLYCERIN, [MAR Hold] ondansetron (ZOFRAN) IV, [MAR Hold] oxyCODONE, [MAR Hold] sodium chloride flush, sodium chloride flush   Assessment/Plan   1. CAD: Very aggressive coronary disease. Had 4 more drug-eluting stents placed in 12/11, 1 in the SVG-LAD and 3 to open the totally occluded native RCA after occlusion of the SVG-PDA. She was deemed not to be a candidate for re-do CABG at that admission by cardiac surgery. Unfortunately, due to her body habitus, the surgeons were unable to mobilize her LIMA for grafting during her initial CABG operation. Lexiscan Cardiolite in 3/14 showed no ischemia or infarction though study was limited by body habitus. She has continued to have chest pain episodes, including the episode that brought her to the hospital on 7/11.  HsTnI was mildly elevated with no trend, seemed more consistent with demand ischemia from volume overload, repeat HsTnI yesterday was 15.   Her Cardiolite showed a moderate area of inferior ischemia.  I have tried to manage her very aggressively with anti-anginal meds given her CKD.   However, she still has significant angina (9/10 chest pain) with a short walk in the hall (recurred today).  Pain resolves mostly with NTG or rest.  She tells me that "I cannot live like this."   - Unfortunately, I think that I need to cath her given the significance of her symptoms.  We had a long discussion again today about the risk of worsening renal failure and even HD from contrast dye.  No Lasix today or yesterday, and she was hydrated starting last night. Creatinine is mildly lower today, I think that she is at her baseline.  After discussion, she wants the catheterization today and understands  the risks.  - Continue ASA, statin, Vascepa.  - Continue Effient long-term since she is a poor Plavix responder and has multiple DES.   -Continue ranolazine 1000 mg bid.   - Continue Imdur 120 mg daily.  - Continue Coreg 25 mg bid.  - Continue amlodipine 2.5 mg daily.   2. Acute on chronic diastolic CHF: NYHA class III symptoms at home. Echo this admission with EF 55-60%, normal RV. Cardiolite with EF 40%, but I reviewed echo and think that the Cardiolite is inaccurate, EF looks like it is in the 60% range.  She diuresed some initially with IV Lasix but creatinine up to 3.11.  Creatinine 2.44 today with CVP 12, she has exertional dyspnea and chest pain still.   - No Lasix today pre-cath and she has had IV fluid.  Will likely need Lasix IV over the weekend.   - RHC with cath, will need left brachial IV.  3. Hyperlipidemia: Good LDL and TGs this admission.   - Continue Vascepa.   - Continue Crestor.  4. Obesity: She needs to continue weight loss efforts. 5.Venous insufficiency:  ABIs normal in 3/19. She has history of venous stasis ulcerations that continue to be present. No DVT on left on venous dopplers.  - Wound consult has seen, recommend unna boot on left.  6. AKI on CKD stage 3.  Baseline creatinine around 2.3, up to 3.1 with diuresis and now 2.43 => 2.56 => 2.44.  CVP 12 this morning.  -  Stay off enalapril.   Length of Stay: 6  Loralie Champagne, MD  10/07/2018, 1:59 PM  Advanced Heart Failure Team Pager [phone] (M-F; 7a - 4p)  Please contact [facility] for night-coverage after hours (4p -7a ) and weekends on [email or web address]

## 2018-10-08 LAB — CBC
HCT: 28.4 % — ABNORMAL LOW (ref 36.0–46.0)
Hemoglobin: 9.2 g/dL — ABNORMAL LOW (ref 12.0–15.0)
MCH: 28.8 pg (ref 26.0–34.0)
MCHC: 32.4 g/dL (ref 30.0–36.0)
MCV: 88.8 fL (ref 80.0–100.0)
Platelets: 198 10*3/uL (ref 150–400)
RBC: 3.2 MIL/uL — ABNORMAL LOW (ref 3.87–5.11)
RDW: 12.6 % (ref 11.5–15.5)
WBC: 6.2 10*3/uL (ref 4.0–10.5)
nRBC: 0.3 % — ABNORMAL HIGH (ref 0.0–0.2)

## 2018-10-08 LAB — BASIC METABOLIC PANEL
Anion gap: 7 (ref 5–15)
BUN: 33 mg/dL — ABNORMAL HIGH (ref 6–20)
CO2: 24 mmol/L (ref 22–32)
Calcium: 9.3 mg/dL (ref 8.9–10.3)
Chloride: 106 mmol/L (ref 98–111)
Creatinine, Ser: 2.32 mg/dL — ABNORMAL HIGH (ref 0.44–1.00)
GFR calc Af Amer: 27 mL/min — ABNORMAL LOW (ref >60)
GFR calc non Af Amer: 23 mL/min — ABNORMAL LOW (ref >60)
Glucose, Bld: 233 mg/dL — ABNORMAL HIGH (ref 70–99)
Potassium: 4.6 mmol/L (ref 3.5–5.1)
Sodium: 137 mmol/L (ref 135–145)

## 2018-10-08 LAB — GLUCOSE, CAPILLARY
Glucose-Capillary: 203 mg/dL — ABNORMAL HIGH (ref 70–99)
Glucose-Capillary: 197 mg/dL — ABNORMAL HIGH (ref 70–99)
Glucose-Capillary: 192 mg/dL — ABNORMAL HIGH (ref 70–99)
Glucose-Capillary: 153 mg/dL — ABNORMAL HIGH (ref 70–99)

## 2018-10-08 NOTE — Progress Notes (Signed)
Noted pt with need for PCI next week and angina with ambulation. Will hold ambulation until PCI. Yves Dill CES, ACSM 8:00 AM 10/08/2018

## 2018-10-08 NOTE — Progress Notes (Signed)
Patient ID: [name], female   DOB: Oct 27, 1965, 53 y.o.   MRN: [id number]     Primary Cardiologist :  Aundra Dubin   Subjective:    Less dyspnea no chest pain   Objective:   Weight Range: (!) 150.4 kg Body mass index is 51.93 kg/m.   Vital Signs:   Temp:  [97.8 F (36.6 C)-98.5 F (36.9 C)] 97.8 F (36.6 C) (07/18 0250) Pulse Rate:  [70-275] 78 (07/18 0250) Resp:  [8-25] 25 (07/18 0250) BP: (115-150)/(34-89) 134/59 (07/18 0250) SpO2:  [99 %-100 %] 100 % (07/18 0250) Weight:  [150.4 kg] 150.4 kg (07/18 0250) Last BM Date: 10/06/18  Weight change: Filed Weights   10/06/18 0530 10/07/18 0500 10/08/18 0250  Weight: (!) 149 kg (!) 150.2 kg (!) 150.4 kg    Intake/Output:   Intake/Output Summary (Last 24 hours) at 10/08/2018 6629 Last data filed at 10/08/2018 0656 Gross per 24 hour  Intake 610 ml  Output 3350 ml  Net -2740 ml      Physical Exam    Affect appropriate Obese black female  HEENT: normal Neck supple with no adenopathy JVP normal no bruits no thyromegaly Lungs clear with no wheezing and good diaphragmatic motion Heart:  S1/S2 no murmur, no rub, gallop or click PMI normal Abdomen: benighn, BS positve, no tenderness, no AAA no bruit.  No HSM or HJR Distal pulses intact with no bruits Plus one bilateral  edema Neuro non-focal Skin warm and dry No muscular weakness PIC line [name] Cath sight RFA ok no hematoma    Telemetry   NSR 70s (personally reviewed).   Labs    CBC Recent Labs    10/07/18 1642 10/08/18 0420  WBC 5.8 6.2  HGB 9.0* 9.2*  HCT 27.2* 28.4*  MCV 86.6 88.8  PLT 183 476   Basic Metabolic Panel Recent Labs    10/07/18 0317  10/07/18 1427 10/07/18 1642 10/08/18 0420  NA 135   < > 140  --  137  K 4.4   < > 4.4  --  4.6  CL 103  --   --   --  106  CO2 25  --   --   --  24  GLUCOSE 225*  --   --   --  233*  BUN 43*  --   --   --  33*  CREATININE 2.44*  --   --  2.10* 2.32*  CALCIUM 9.3  --   --   --  9.3   < > = values  in this interval not displayed.   Liver Function Tests No results for input(s): AST, ALT, ALKPHOS, BILITOT, PROT, ALBUMIN in the last 72 hours. No results for input(s): LIPASE, AMYLASE in the last 72 hours. Cardiac Enzymes No results for input(s): CKTOTAL, CKMB, CKMBINDEX, TROPONINI in the last 72 hours.  BNP: BNP (last 3 results) Recent Labs    10/01/18 2330  BNP 203.9*     Other results:  Cath 10/07/18 1. Normal left-sided filling pressures (PCWP and LVEDP) but elevated right-sided pressures suggesting primary RV failure. PAPI is adequate, good cardiac output, pulmonary venous hypertension.  2. Occluded SVG-RCA and native RCA with faint collaterals.  There does not appear to be a good option for intervention here.  3. 95% mid LCx stenosis.  4. Totally occluded mid LAD with 80% in-stent restenosis proximally in the SVG-LAD.  5. 35 cc contrast used (CKD).   I reviewed this study with Dr. Martinique.  The patient  has significant angina with minimal exertion (walking part of the way up the hall).  She is not a candidate for redo CABG (unable to mobilize the LIMA with CABG #1).  I do not believe that we have any good way to treat her RCA system (the entire RCA from proximal to distal was stented before and is now occluded).  CTO approach to the LAD would be an option but difficult.  Initial plan, therefore, will be atherectomy +/- stenting to the proximal/mid SVG-LAD.  PCI to mid LCx is also an option, would likely need to be staged.    Plan for PCI with Dr Martinique on Tuesday morning.  She will remain in hospital.  Gentle hydration tonight, will likely need some IV Lasix tomorrow.   Imaging     Medications:     Scheduled Medications: . amLODipine  2.5 mg Oral Daily  . aspirin EC  81 mg Oral Daily  . carvedilol  25 mg Oral BID WC  . citalopram  20 mg Oral Daily  . citalopram  30 mg Oral QHS  . febuxostat  80 mg Oral Daily  . fenofibrate  160 mg Oral Daily  . gabapentin  100 mg  Oral 2 times per day  . gabapentin  200 mg Oral QHS  . heparin  5,000 Units Subcutaneous Q8H  . insulin aspart  0-15 Units Subcutaneous TID WC  . insulin aspart  12 Units Subcutaneous TID WC  . insulin glargine  50 Units Subcutaneous BID  . isosorbide mononitrate  120 mg Oral Daily  . LORazepam  0.5 mg Oral Q8H  . omega-3 acid ethyl esters  2 g Oral BID  . pneumococcal 23 valent vaccine  0.5 mL Intramuscular Tomorrow-1000  . prasugrel  10 mg Oral q morning - 10a  . ranolazine  1,000 mg Oral BID  . rosuvastatin  5 mg Oral Daily  . sodium chloride flush  10-40 mL Intracatheter Q12H  . sodium chloride flush  3 mL Intravenous Q12H  . sodium chloride flush  3 mL Intravenous Q12H    Infusions: . sodium chloride      PRN Medications: sodium chloride, acetaminophen, alum & mag hydroxide-simeth, nitroGLYCERIN, ondansetron (ZOFRAN) IV, oxyCODONE, sodium chloride flush, sodium chloride flush   Assessment/Plan   1. CAD: for intervention with PJ on Tuesday continue heparin likely atherectomy SVG LAD and staged intervention to mid circumflex   2. Acute on chronic diastolic CHF: NYHA class III symptoms at home. Echo this admission with EF 55-60%, normal RV.  Left sided pressures normal on cath with PCWP 14 mmHg  3. Hyperlipidemia: Good LDL and TGs this admission.  On vascepa and crestor 4. Obesity: She needs to continue weight loss efforts.  5.Venous insufficiency:  ABIs normal in 3/19. She has history of venous stasis ulcerations that continue to be present. No DVT on left on venous dopplers.   6. AKI on CKD stage 3.  Cr mildly increased post cath 2.3 f/u in am Diuretics held yesterday for cath With normal filling pressures at cath and elevated Cr today will hold diuretic today At home had been on demedex 40 am and 20 mg PM If Cr same or lower will resume diuretic in am   Length of Stay: 7  Jenkins Rouge, MD  10/08/2018, 8:08 AM

## 2018-10-09 LAB — BASIC METABOLIC PANEL
Anion gap: 7 (ref 5–15)
Anion gap: 8 (ref 5–15)
BUN: 38 mg/dL — ABNORMAL HIGH (ref 6–20)
BUN: 33 mg/dL — ABNORMAL HIGH (ref 6–20)
CO2: 24 mmol/L (ref 22–32)
CO2: 23 mmol/L (ref 22–32)
Calcium: 9.1 mg/dL (ref 8.9–10.3)
Calcium: 9.2 mg/dL (ref 8.9–10.3)
Chloride: 105 mmol/L (ref 98–111)
Chloride: 105 mmol/L (ref 98–111)
Creatinine, Ser: 2.41 mg/dL — ABNORMAL HIGH (ref 0.44–1.00)
Creatinine, Ser: 2.28 mg/dL — ABNORMAL HIGH (ref 0.44–1.00)
GFR calc Af Amer: 26 mL/min — ABNORMAL LOW (ref >60)
GFR calc Af Amer: 27 mL/min — ABNORMAL LOW (ref >60)
GFR calc non Af Amer: 22 mL/min — ABNORMAL LOW (ref >60)
GFR calc non Af Amer: 24 mL/min — ABNORMAL LOW (ref >60)
Glucose, Bld: 258 mg/dL — ABNORMAL HIGH (ref 70–99)
Glucose, Bld: 226 mg/dL — ABNORMAL HIGH (ref 70–99)
Potassium: 4.4 mmol/L (ref 3.5–5.1)
Potassium: 4.8 mmol/L (ref 3.5–5.1)
Sodium: 136 mmol/L (ref 135–145)
Sodium: 136 mmol/L (ref 135–145)

## 2018-10-09 LAB — CBC
HCT: 28 % — ABNORMAL LOW (ref 36.0–46.0)
Hemoglobin: 9 g/dL — ABNORMAL LOW (ref 12.0–15.0)
MCH: 28.7 pg (ref 26.0–34.0)
MCHC: 32.1 g/dL (ref 30.0–36.0)
MCV: 89.2 fL (ref 80.0–100.0)
Platelets: 195 10*3/uL (ref 150–400)
RBC: 3.14 MIL/uL — ABNORMAL LOW (ref 3.87–5.11)
RDW: 12.7 % (ref 11.5–15.5)
WBC: 6.1 10*3/uL (ref 4.0–10.5)
nRBC: 0.3 % — ABNORMAL HIGH (ref 0.0–0.2)

## 2018-10-09 LAB — GLUCOSE, CAPILLARY
Glucose-Capillary: 212 mg/dL — ABNORMAL HIGH (ref 70–99)
Glucose-Capillary: 172 mg/dL — ABNORMAL HIGH (ref 70–99)
Glucose-Capillary: 205 mg/dL — ABNORMAL HIGH (ref 70–99)
Glucose-Capillary: 182 mg/dL — ABNORMAL HIGH (ref 70–99)
Glucose-Capillary: 132 mg/dL — ABNORMAL HIGH (ref 70–99)

## 2018-10-09 MED ORDER — SODIUM CHLORIDE 0.9 % IV SOLN
INTRAVENOUS | Status: DC
  Administered 2018-10-09 – 2018-10-11 (×2): via INTRAVENOUS

## 2018-10-09 NOTE — Plan of Care (Signed)

## 2018-10-09 NOTE — Progress Notes (Signed)
Patient ID: [name], female   DOB: May 24, 1965, 53 y.o.   MRN: [id number]     Primary Cardiologist :  Aundra Dubin   Subjective:    Sitting in chair good breakfast Discussed elevating Cr  Objective:   Weight Range: (!) 153.4 kg Body mass index is 52.97 kg/m.   Vital Signs:   Temp:  [98 F (36.7 C)-98.5 F (36.9 C)] 98 F (36.7 C) (07/19 0611) Pulse Rate:  [77-84] 79 (07/19 0611) Resp:  [12-19] 12 (07/19 0611) BP: (121-141)/(50-68) 130/59 (07/19 0611) SpO2:  [96 %-100 %] 96 % (07/19 0611) Weight:  [153.4 kg] 153.4 kg (07/19 0611) Last BM Date: 10/07/18  Weight change: Filed Weights   10/07/18 0500 10/08/18 0250 10/09/18 0611  Weight: (!) 150.2 kg (!) 150.4 kg (!) 153.4 kg    Intake/Output:   Intake/Output Summary (Last 24 hours) at 10/09/2018 8588 Last data filed at 10/09/2018 0400 Gross per 24 hour  Intake -  Output 1950 ml  Net -1950 ml      Physical Exam    Affect appropriate Obese black female  HEENT: normal Neck supple with no adenopathy JVP normal no bruits no thyromegaly Lungs clear with no wheezing and good diaphragmatic motion Heart:  S1/S2 no murmur, no rub, gallop or click PMI normal Abdomen: benighn, BS positve, no tenderness, no AAA no bruit.  No HSM or HJR Distal pulses intact with no bruits Plus one bilateral  edema Neuro non-focal Skin warm and dry No muscular weakness PIC line [name] Cath sight RFA ok no hematoma    Telemetry   NSR 70s (personally reviewed).   Labs    CBC Recent Labs    10/08/18 0420 10/09/18 0407  WBC 6.2 6.1  HGB 9.2* 9.0*  HCT 28.4* 28.0*  MCV 88.8 89.2  PLT 198 502   Basic Metabolic Panel Recent Labs    10/08/18 0420 10/09/18 0407  NA 137 136  K 4.6 4.4  CL 106 105  CO2 24 24  GLUCOSE 233* 258*  BUN 33* 38*  CREATININE 2.32* 2.41*  CALCIUM 9.3 9.1    BNP: BNP (last 3 results) Recent Labs    10/01/18 2330  BNP 203.9*     Other results:  Cath 10/07/18 1. Normal left-sided filling  pressures (PCWP and LVEDP) but elevated right-sided pressures suggesting primary RV failure. PAPI is adequate, good cardiac output, pulmonary venous hypertension.  2. Occluded SVG-RCA and native RCA with faint collaterals.  There does not appear to be a good option for intervention here.  3. 95% mid LCx stenosis.  4. Totally occluded mid LAD with 80% in-stent restenosis proximally in the SVG-LAD.  5. 35 cc contrast used (CKD).   I reviewed this study with Dr. Martinique.  The patient has significant angina with minimal exertion (walking part of the way up the hall).  She is not a candidate for redo CABG (unable to mobilize the LIMA with CABG #1).  I do not believe that we have any good way to treat her RCA system (the entire RCA from proximal to distal was stented before and is now occluded).  CTO approach to the LAD would be an option but difficult.  Initial plan, therefore, will be atherectomy +/- stenting to the proximal/mid SVG-LAD.  PCI to mid LCx is also an option, would likely need to be staged.    Plan for PCI with Dr Martinique on Tuesday morning.  She will remain in hospital.  Gentle hydration tonight, will likely need some  IV Lasix tomorrow.   Imaging     Medications:     Scheduled Medications: . amLODipine  2.5 mg Oral Daily  . aspirin EC  81 mg Oral Daily  . carvedilol  25 mg Oral BID WC  . citalopram  20 mg Oral Daily  . citalopram  30 mg Oral QHS  . febuxostat  80 mg Oral Daily  . fenofibrate  160 mg Oral Daily  . gabapentin  100 mg Oral 2 times per day  . gabapentin  200 mg Oral QHS  . heparin  5,000 Units Subcutaneous Q8H  . insulin aspart  0-15 Units Subcutaneous TID WC  . insulin aspart  12 Units Subcutaneous TID WC  . insulin glargine  50 Units Subcutaneous BID  . isosorbide mononitrate  120 mg Oral Daily  . LORazepam  0.5 mg Oral Q8H  . omega-3 acid ethyl esters  2 g Oral BID  . pneumococcal 23 valent vaccine  0.5 mL Intramuscular Tomorrow-1000  . prasugrel  10 mg  Oral q morning - 10a  . ranolazine  1,000 mg Oral BID  . rosuvastatin  5 mg Oral Daily  . sodium chloride flush  10-40 mL Intracatheter Q12H  . sodium chloride flush  3 mL Intravenous Q12H  . sodium chloride flush  3 mL Intravenous Q12H    Infusions: . sodium chloride      PRN Medications: sodium chloride, acetaminophen, alum & mag hydroxide-simeth, nitroGLYCERIN, ondansetron (ZOFRAN) IV, oxyCODONE, sodium chloride flush, sodium chloride flush   Assessment/Plan   1. CAD: for intervention with PJ on Tuesday continue heparin likely atherectomy SVG LAD and staged intervention to mid circumflex will need to follow Hct and Cr  2. Acute on chronic diastolic CHF: NYHA class III symptoms at home. Echo this admission with EF 55-60%, normal RV.  Left sided pressures normal on cath with PCWP 14 mmHg with increasing Cr post contrast will hydrate today   3. Hyperlipidemia: Good LDL and TGs this admission.  On vascepa and crestor 4. Obesity: She needs to continue weight loss efforts.  5.Venous insufficiency:  ABIs normal in 3/19. She has history of venous stasis ulcerations that continue to be present. No DVT on left on venous dopplers. ACE wrap on LLE plus one bilateral edema giving fluid today to protect kidneys and try to prevent postponement of intervention on Tuesday   6. AKI on CKD stage 3.  Cr increased post cath 2.43 f/u in am Diuretics held yesterday for cath With normal filling pressures at cath and elevated Cr today will hold diuretic today At home had been on demedex 40 am and 20 mg PM hydrate today follow Cr in am   Length of Stay: 8  Jenkins Rouge, MD  10/09/2018, 8:32 AM

## 2018-10-10 ENCOUNTER — Encounter (HOSPITAL_COMMUNITY): Payer: Self-pay | Admitting: Cardiology

## 2018-10-10 LAB — CBC
HCT: 26.3 % — ABNORMAL LOW (ref 36.0–46.0)
Hemoglobin: 8.4 g/dL — ABNORMAL LOW (ref 12.0–15.0)
MCH: 28.5 pg (ref 26.0–34.0)
MCHC: 31.9 g/dL (ref 30.0–36.0)
MCV: 89.2 fL (ref 80.0–100.0)
Platelets: 173 10*3/uL (ref 150–400)
RBC: 2.95 MIL/uL — ABNORMAL LOW (ref 3.87–5.11)
RDW: 12.5 % (ref 11.5–15.5)
WBC: 5.8 10*3/uL (ref 4.0–10.5)
nRBC: 0.5 % — ABNORMAL HIGH (ref 0.0–0.2)

## 2018-10-10 LAB — GLUCOSE, CAPILLARY
Glucose-Capillary: 138 mg/dL — ABNORMAL HIGH (ref 70–99)
Glucose-Capillary: 107 mg/dL — ABNORMAL HIGH (ref 70–99)
Glucose-Capillary: 176 mg/dL — ABNORMAL HIGH (ref 70–99)
Glucose-Capillary: 184 mg/dL — ABNORMAL HIGH (ref 70–99)
Glucose-Capillary: 204 mg/dL — ABNORMAL HIGH (ref 70–99)

## 2018-10-10 MED ORDER — DOCUSATE SODIUM 100 MG PO CAPS
100.0000 mg | ORAL_CAPSULE | Freq: Every day | ORAL | Status: DC | PRN
  Administered 2018-10-10 – 2018-10-12 (×3): 100 mg via ORAL
  Filled 2018-10-10 (×3): qty 1

## 2018-10-10 MED ORDER — FUROSEMIDE 10 MG/ML IJ SOLN
60.0000 mg | Freq: Once | INTRAMUSCULAR | Status: AC
  Administered 2018-10-10: 60 mg via INTRAVENOUS
  Filled 2018-10-10: qty 6

## 2018-10-10 MED ORDER — PANTOPRAZOLE SODIUM 20 MG PO TBEC
20.0000 mg | DELAYED_RELEASE_TABLET | Freq: Every day | ORAL | Status: DC
  Administered 2018-10-10 – 2018-10-17 (×7): 20 mg via ORAL
  Filled 2018-10-10 (×7): qty 1

## 2018-10-10 MED FILL — Heparin Sodium (Porcine) Inj 1000 Unit/ML: INTRAMUSCULAR | Qty: 10 | Status: AC

## 2018-10-10 MED FILL — Verapamil HCl IV Soln 2.5 MG/ML: INTRAVENOUS | Qty: 2 | Status: AC

## 2018-10-10 NOTE — Progress Notes (Signed)
Patient ID: [name], female   DOB: 07-02-1965, 53 y.o.   MRN: [id number]     Primary Cardiologist :  Aundra Dubin   Subjective:    Sitting in chair. Feels ok. Continues to have episodes of CP per her report but looks comfortable. Denies SOB, orthopnea or PND.   Started on NS at 75cc/HR yesterday. Weight up 4 pounds to 342.  CVP 13-14. (checked personally)  Objective:   Weight Range: (!) 155.2 kg Body mass index is 53.59 kg/m.   Vital Signs:   Temp:  [98.2 F (36.8 C)-98.4 F (36.9 C)] 98.2 F (36.8 C) (07/20 0542) Pulse Rate:  [78-83] 83 (07/20 0542) Resp:  [14-17] 14 (07/20 0542) BP: (127-133)/(59-62) 133/59 (07/20 0542) SpO2:  [100 %] 100 % (07/20 0542) Weight:  [155.2 kg] 155.2 kg (07/20 0542) Last BM Date: 10/07/18  Weight change: Filed Weights   10/08/18 0250 10/09/18 0611 10/10/18 0542  Weight: (!) 150.4 kg (!) 153.4 kg (!) 155.2 kg    Intake/Output:   Intake/Output Summary (Last 24 hours) at 10/10/2018 1837 Last data filed at 10/10/2018 1728 Gross per 24 hour  Intake 571.15 ml  Output 3100 ml  Net -2528.85 ml      Physical Exam    General:  Obese woman sitting in chair with multiple drinks on her table. NAD HEENT: normal x for hirsuitism  Neck: supple. Hard to see JVP due to girth. Carotids 2+ bilat; no bruits. No lymphadenopathy or thryomegaly appreciated. Cor: PMI nondisplaced. Regular rate & rhythm. No rubs, gallops or murmurs. Lungs: clear Abdomen: obese  soft, nontender, nondistended. No hepatosplenomegaly. No bruits or masses. Good bowel sounds. Extremities: no cyanosis, clubbing, rash, 1+ edema Neuro: alert & orientedx3, cranial nerves grossly intact. moves all 4 extremities w/o difficulty. Affect pleasant    Telemetry   NSR 70-80s (personally reviewed).   Labs    CBC Recent Labs    10/09/18 0407 10/10/18 0426  WBC 6.1 5.8  HGB 9.0* 8.4*  HCT 28.0* 26.3*  MCV 89.2 89.2  PLT 195 865   Basic Metabolic Panel Recent Labs    10/09/18  0407 10/09/18 1204  NA 136 136  K 4.4 4.8  CL 105 105  CO2 24 23  GLUCOSE 258* 226*  BUN 38* 33*  CREATININE 2.41* 2.28*  CALCIUM 9.1 9.2    BNP: BNP (last 3 results) Recent Labs    10/01/18 2330  BNP 203.9*     Other results:  Cath 10/07/18 1. Normal left-sided filling pressures (PCWP and LVEDP) but elevated right-sided pressures suggesting primary RV failure. PAPI is adequate, good cardiac output, pulmonary venous hypertension.  2. Occluded SVG-RCA and native RCA with faint collaterals.  There does not appear to be a good option for intervention here.  3. 95% mid LCx stenosis.  4. Totally occluded mid LAD with 80% in-stent restenosis proximally in the SVG-LAD.  5. 35 cc contrast used (CKD).   I reviewed this study with Dr. Martinique.  The patient has significant angina with minimal exertion (walking part of the way up the hall).  She is not a candidate for redo CABG (unable to mobilize the LIMA with CABG #1).  I do not believe that we have any good way to treat her RCA system (the entire RCA from proximal to distal was stented before and is now occluded).  CTO approach to the LAD would be an option but difficult.  Initial plan, therefore, will be atherectomy +/- stenting to the proximal/mid SVG-LAD.  PCI to mid LCx is also an option, would likely need to be staged.    Plan for PCI with Dr Martinique on Tuesday morning.  She will remain in hospital.  Gentle hydration tonight, will likely need some IV Lasix tomorrow.   Imaging     Medications:     Scheduled Medications: . amLODipine  2.5 mg Oral Daily  . aspirin EC  81 mg Oral Daily  . carvedilol  25 mg Oral BID WC  . citalopram  20 mg Oral Daily  . citalopram  30 mg Oral QHS  . febuxostat  80 mg Oral Daily  . fenofibrate  160 mg Oral Daily  . gabapentin  100 mg Oral 2 times per day  . gabapentin  200 mg Oral QHS  . heparin  5,000 Units Subcutaneous Q8H  . insulin aspart  0-15 Units Subcutaneous TID WC  . insulin  aspart  12 Units Subcutaneous TID WC  . insulin glargine  50 Units Subcutaneous BID  . isosorbide mononitrate  120 mg Oral Daily  . LORazepam  0.5 mg Oral Q8H  . omega-3 acid ethyl esters  2 g Oral BID  . pantoprazole  20 mg Oral Daily  . pneumococcal 23 valent vaccine  0.5 mL Intramuscular Tomorrow-1000  . prasugrel  10 mg Oral q morning - 10a  . ranolazine  1,000 mg Oral BID  . rosuvastatin  5 mg Oral Daily  . sodium chloride flush  10-40 mL Intracatheter Q12H  . sodium chloride flush  3 mL Intravenous Q12H  . sodium chloride flush  3 mL Intravenous Q12H    Infusions: . sodium chloride    . sodium chloride 50 mL/hr at 10/10/18 1728    PRN Medications: sodium chloride, acetaminophen, alum & mag hydroxide-simeth, docusate sodium, nitroGLYCERIN, ondansetron (ZOFRAN) IV, oxyCODONE, sodium chloride flush, sodium chloride flush   Assessment/Plan   1. CAD:  - still with ongoing CP per report - on for PCI with Dr. Martinique tomorrow at [phone] for PCI with likely atherectomy SVG LAD and staged intervention to mid circumflex depending on renal function - case scheduled and she is aware of risks especially risk of worsening renal failure and need for HD - continue DAPT/statin/heparin  2. Acute on chronic diastolic CHF: NYHA class III symptoms at home. Echo this admission with EF 55-60%, normal RV.  Left sided pressures normal on cath with PCWP 14 mmHg with increasing Cr post contrast - creatinine is down to 2.2 with hydration but she is volume overloaded currently with rising CVP. I worry she may not be able to lie flat on the cath table for porlonged intervention  - will give lasix 60IV today and drop IVF to 50cc/hr  3. Hyperlipidemia: Good LDL and TGs this admission.  On vascepa and crestor  4. Obesity: She needs to continue weight loss efforts.  5.Venous insufficiency:  ABIs normal in 3/19. She has history of venous stasis ulcerations that continue to be present. No DVT on left on  venous dopplers. ACE wrap on LLE plus one bilateral edema giving fluid today to protect kidneys and try to prevent postponement of intervention on Tuesday   6. AKI on CKD stage 3.   - no BMET today but creatinine down to 2.2 yesterday which is the lowest it has been in awhile - see plan for pre-cath hydration above. - BMET in am  Length of Stay: [place], MD  10/10/2018, 6:37 PM

## 2018-10-10 NOTE — Care Management Important Message (Signed)
Important Message  Patient Details  Name: [name] MRN: [id number] Date of Birth: Jan 04, 1966   Medicare Important Message Given:  Yes     Shelda Altes 10/10/2018, 2:13 PM

## 2018-10-10 NOTE — Plan of Care (Signed)

## 2018-10-10 NOTE — H&P (View-Only) (Signed)
Patient ID: [name], female   DOB: [birth year], 53 y.o.   MRN: [id number]     Primary Cardiologist :  Aundra Dubin   Subjective:    Sitting in chair. Feels ok. Continues to have episodes of CP per her report but looks comfortable. Denies SOB, orthopnea or PND.   Started on NS at 75cc/HR yesterday. Weight up 4 pounds to 342.  CVP 13-14. (checked personally)  Objective:   Weight Range: (!) 155.2 kg Body mass index is 53.59 kg/m.   Vital Signs:   Temp:  [98.2 F (36.8 C)-98.4 F (36.9 C)] 98.2 F (36.8 C) (07/20 0542) Pulse Rate:  [78-83] 83 (07/20 0542) Resp:  [14-17] 14 (07/20 0542) BP: (127-133)/(59-62) 133/59 (07/20 0542) SpO2:  [100 %] 100 % (07/20 0542) Weight:  [155.2 kg] 155.2 kg (07/20 0542) Last BM Date: 10/07/18  Weight change: Filed Weights   10/08/18 0250 10/09/18 0611 10/10/18 0542  Weight: (!) 150.4 kg (!) 153.4 kg (!) 155.2 kg    Intake/Output:   Intake/Output Summary (Last 24 hours) at 10/10/2018 1837 Last data filed at 10/10/2018 1728 Gross per 24 hour  Intake 571.15 ml  Output 3100 ml  Net -2528.85 ml      Physical Exam    General:  Obese woman sitting in chair with multiple drinks on her table. NAD HEENT: normal x for hirsuitism  Neck: supple. Hard to see JVP due to girth. Carotids 2+ bilat; no bruits. No lymphadenopathy or thryomegaly appreciated. Cor: PMI nondisplaced. Regular rate & rhythm. No rubs, gallops or murmurs. Lungs: clear Abdomen: obese  soft, nontender, nondistended. No hepatosplenomegaly. No bruits or masses. Good bowel sounds. Extremities: no cyanosis, clubbing, rash, 1+ edema Neuro: alert & orientedx3, cranial nerves grossly intact. moves all 4 extremities w/o difficulty. Affect pleasant    Telemetry   NSR 70-80s (personally reviewed).   Labs    CBC Recent Labs    10/09/18 0407 10/10/18 0426  WBC 6.1 5.8  HGB 9.0* 8.4*  HCT 28.0* 26.3*  MCV 89.2 89.2  PLT 195 540   Basic Metabolic Panel Recent Labs    10/09/18  0407 10/09/18 1204  NA 136 136  K 4.4 4.8  CL 105 105  CO2 24 23  GLUCOSE 258* 226*  BUN 38* 33*  CREATININE 2.41* 2.28*  CALCIUM 9.1 9.2    BNP: BNP (last 3 results) Recent Labs    10/01/18 2330  BNP 203.9*     Other results:  Cath 10/07/18 1. Normal left-sided filling pressures (PCWP and LVEDP) but elevated right-sided pressures suggesting primary RV failure. PAPI is adequate, good cardiac output, pulmonary venous hypertension.  2. Occluded SVG-RCA and native RCA with faint collaterals.  There does not appear to be a good option for intervention here.  3. 95% mid LCx stenosis.  4. Totally occluded mid LAD with 80% in-stent restenosis proximally in the SVG-LAD.  5. 35 cc contrast used (CKD).   I reviewed this study with Dr. Martinique.  The patient has significant angina with minimal exertion (walking part of the way up the hall).  She is not a candidate for redo CABG (unable to mobilize the LIMA with CABG #1).  I do not believe that we have any good way to treat her RCA system (the entire RCA from proximal to distal was stented before and is now occluded).  CTO approach to the LAD would be an option but difficult.  Initial plan, therefore, will be atherectomy +/- stenting to the proximal/mid SVG-LAD.  PCI to mid LCx is also an option, would likely need to be staged.    Plan for PCI with Dr Martinique on Tuesday morning.  She will remain in hospital.  Gentle hydration tonight, will likely need some IV Lasix tomorrow.   Imaging     Medications:     Scheduled Medications: . amLODipine  2.5 mg Oral Daily  . aspirin EC  81 mg Oral Daily  . carvedilol  25 mg Oral BID WC  . citalopram  20 mg Oral Daily  . citalopram  30 mg Oral QHS  . febuxostat  80 mg Oral Daily  . fenofibrate  160 mg Oral Daily  . gabapentin  100 mg Oral 2 times per day  . gabapentin  200 mg Oral QHS  . heparin  5,000 Units Subcutaneous Q8H  . insulin aspart  0-15 Units Subcutaneous TID WC  . insulin  aspart  12 Units Subcutaneous TID WC  . insulin glargine  50 Units Subcutaneous BID  . isosorbide mononitrate  120 mg Oral Daily  . LORazepam  0.5 mg Oral Q8H  . omega-3 acid ethyl esters  2 g Oral BID  . pantoprazole  20 mg Oral Daily  . pneumococcal 23 valent vaccine  0.5 mL Intramuscular Tomorrow-1000  . prasugrel  10 mg Oral q morning - 10a  . ranolazine  1,000 mg Oral BID  . rosuvastatin  5 mg Oral Daily  . sodium chloride flush  10-40 mL Intracatheter Q12H  . sodium chloride flush  3 mL Intravenous Q12H  . sodium chloride flush  3 mL Intravenous Q12H    Infusions: . sodium chloride    . sodium chloride 50 mL/hr at 10/10/18 1728    PRN Medications: sodium chloride, acetaminophen, alum & mag hydroxide-simeth, docusate sodium, nitroGLYCERIN, ondansetron (ZOFRAN) IV, oxyCODONE, sodium chloride flush, sodium chloride flush   Assessment/Plan   1. CAD:  - still with ongoing CP per report - on for PCI with Dr. Martinique tomorrow at [phone] for PCI with likely atherectomy SVG LAD and staged intervention to mid circumflex depending on renal function - case scheduled and she is aware of risks especially risk of worsening renal failure and need for HD - continue DAPT/statin/heparin  2. Acute on chronic diastolic CHF: NYHA class III symptoms at home. Echo this admission with EF 55-60%, normal RV.  Left sided pressures normal on cath with PCWP 14 mmHg with increasing Cr post contrast - creatinine is down to 2.2 with hydration but she is volume overloaded currently with rising CVP. I worry she may not be able to lie flat on the cath table for porlonged intervention  - will give lasix 60IV today and drop IVF to 50cc/hr  3. Hyperlipidemia: Good LDL and TGs this admission.  On vascepa and crestor  4. Obesity: She needs to continue weight loss efforts.  5.Venous insufficiency:  ABIs normal in 3/19. She has history of venous stasis ulcerations that continue to be present. No DVT on left on  venous dopplers. ACE wrap on LLE plus one bilateral edema giving fluid today to protect kidneys and try to prevent postponement of intervention on Tuesday   6. AKI on CKD stage 3.   - no BMET today but creatinine down to 2.2 yesterday which is the lowest it has been in awhile - see plan for pre-cath hydration above. - BMET in am  Length of Stay: [name], MD  10/10/2018, 6:37 PM

## 2018-10-11 ENCOUNTER — Encounter (HOSPITAL_COMMUNITY)
Admission: AD | Disposition: A | Discharge status: Home / Self Care | Payer: Medicare Other | Source: Other Acute Inpatient Hospital | Attending: Cardiology

## 2018-10-11 DIAGNOSIS — I25710 Atherosclerosis of autologous vein coronary artery bypass graft(s) with unstable angina pectoris: Secondary | ICD-10-CM

## 2018-10-11 DIAGNOSIS — N183 Chronic kidney disease, stage 3 (moderate): Secondary | ICD-10-CM

## 2018-10-11 HISTORY — PX: CORONARY STENT INTERVENTION: CATH118234

## 2018-10-11 LAB — BASIC METABOLIC PANEL
Anion gap: 6 (ref 5–15)
BUN: 33 mg/dL — ABNORMAL HIGH (ref 6–20)
CO2: 24 mmol/L (ref 22–32)
Calcium: 8.8 mg/dL — ABNORMAL LOW (ref 8.9–10.3)
Chloride: 109 mmol/L (ref 98–111)
Creatinine, Ser: 2.04 mg/dL — ABNORMAL HIGH (ref 0.44–1.00)
GFR calc Af Amer: 31 mL/min — ABNORMAL LOW (ref >60)
GFR calc non Af Amer: 27 mL/min — ABNORMAL LOW (ref >60)
Glucose, Bld: 198 mg/dL — ABNORMAL HIGH (ref 70–99)
Potassium: 4.2 mmol/L (ref 3.5–5.1)
Sodium: 139 mmol/L (ref 135–145)

## 2018-10-11 LAB — CBC
HCT: 26.7 % — ABNORMAL LOW (ref 36.0–46.0)
Hemoglobin: 8.5 g/dL — ABNORMAL LOW (ref 12.0–15.0)
MCH: 28.7 pg (ref 26.0–34.0)
MCHC: 31.8 g/dL (ref 30.0–36.0)
MCV: 90.2 fL (ref 80.0–100.0)
Platelets: 192 10*3/uL (ref 150–400)
RBC: 2.96 MIL/uL — ABNORMAL LOW (ref 3.87–5.11)
RDW: 12.7 % (ref 11.5–15.5)
WBC: 5.6 10*3/uL (ref 4.0–10.5)
nRBC: 0.4 % — ABNORMAL HIGH (ref 0.0–0.2)

## 2018-10-11 LAB — GLUCOSE, CAPILLARY
Glucose-Capillary: 199 mg/dL — ABNORMAL HIGH (ref 70–99)
Glucose-Capillary: 130 mg/dL — ABNORMAL HIGH (ref 70–99)
Glucose-Capillary: 97 mg/dL (ref 70–99)
Glucose-Capillary: 161 mg/dL — ABNORMAL HIGH (ref 70–99)

## 2018-10-11 LAB — POCT ACTIVATED CLOTTING TIME
Activated Clotting Time: 323 seconds
Activated Clotting Time: 180 seconds

## 2018-10-11 SURGERY — CORONARY STENT INTERVENTION
Anesthesia: LOCAL

## 2018-10-11 MED ORDER — SODIUM CHLORIDE 0.9% FLUSH: 3.0000 mL | Freq: Two times a day (BID) | INTRAVENOUS | Status: DC

## 2018-10-11 MED ORDER — MIDAZOLAM HCL 2 MG/2ML IJ SOLN
INTRAMUSCULAR | Status: AC
  Filled 2018-10-11: qty 2

## 2018-10-11 MED ORDER — HEPARIN SODIUM (PORCINE) 5000 UNIT/ML IJ SOLN
5000.0000 [IU] | Freq: Three times a day (TID) | INTRAMUSCULAR | Status: DC
  Administered 2018-10-11 – 2018-10-17 (×17): 5000 [IU] via SUBCUTANEOUS
  Filled 2018-10-11 (×17): qty 1

## 2018-10-11 MED ORDER — SODIUM CHLORIDE 0.9% FLUSH
3.0000 mL | INTRAVENOUS | Status: DC | PRN
  Administered 2018-10-13: 3 mL via INTRAVENOUS
  Filled 2018-10-11: qty 3

## 2018-10-11 MED ORDER — LIDOCAINE HCL (PF) 1 % IJ SOLN
INTRAMUSCULAR | Status: AC
  Filled 2018-10-11: qty 30

## 2018-10-11 MED ORDER — BIVALIRUDIN TRIFLUOROACETATE 250 MG IV SOLR
INTRAVENOUS | Status: AC
  Filled 2018-10-11: qty 250

## 2018-10-11 MED ORDER — FENTANYL CITRATE (PF) 100 MCG/2ML IJ SOLN
INTRAMUSCULAR | Status: DC | PRN
  Administered 2018-10-11 (×5): 25 ug via INTRAVENOUS

## 2018-10-11 MED ORDER — FUROSEMIDE 10 MG/ML IJ SOLN
60.0000 mg | Freq: Once | INTRAMUSCULAR | Status: AC
  Administered 2018-10-11: 60 mg via INTRAVENOUS
  Filled 2018-10-11: qty 6

## 2018-10-11 MED ORDER — SODIUM CHLORIDE 0.9 % WEIGHT BASED INFUSION: 1.0000 mL/kg/h | INTRAVENOUS | Status: DC

## 2018-10-11 MED ORDER — SODIUM CHLORIDE 0.9 % IV SOLN
INTRAVENOUS | Status: DC | PRN
  Administered 2018-10-11 (×3): 1.75 mg/kg/h via INTRAVENOUS

## 2018-10-11 MED ORDER — FENTANYL CITRATE (PF) 100 MCG/2ML IJ SOLN
INTRAMUSCULAR | Status: AC
  Filled 2018-10-11: qty 2

## 2018-10-11 MED ORDER — HEPARIN (PORCINE) IN NACL 1000-0.9 UT/500ML-% IV SOLN
INTRAVENOUS | Status: AC
  Filled 2018-10-11: qty 500

## 2018-10-11 MED ORDER — SODIUM CHLORIDE 0.9 % IV SOLN: INTRAVENOUS | Status: AC

## 2018-10-11 MED ORDER — FUROSEMIDE 10 MG/ML IJ SOLN
60.0000 mg | Freq: Two times a day (BID) | INTRAMUSCULAR | Status: DC
  Administered 2018-10-11: 19:00:00 60 mg via INTRAVENOUS
  Filled 2018-10-11 (×2): qty 6

## 2018-10-11 MED ORDER — ASPIRIN 81 MG PO CHEW
81.0000 mg | CHEWABLE_TABLET | ORAL | Status: AC
  Administered 2018-10-11: 05:00:00 81 mg via ORAL
  Filled 2018-10-11: qty 1

## 2018-10-11 MED ORDER — SODIUM CHLORIDE 0.9 % IV SOLN: 250.0000 mL | INTRAVENOUS | Status: DC | PRN

## 2018-10-11 MED ORDER — IOHEXOL 350 MG/ML SOLN
INTRAVENOUS | Status: DC | PRN
  Administered 2018-10-11: 55 mL via INTRAVENOUS

## 2018-10-11 MED ORDER — ONDANSETRON HCL 4 MG/2ML IJ SOLN
INTRAMUSCULAR | Status: AC
  Filled 2018-10-11: qty 2

## 2018-10-11 MED ORDER — SODIUM CHLORIDE 0.9 % IV SOLN
INTRAVENOUS | Status: AC | PRN
  Administered 2018-10-11: 156 mL/h via INTRAVENOUS

## 2018-10-11 MED ORDER — MIDAZOLAM HCL 2 MG/2ML IJ SOLN
INTRAMUSCULAR | Status: DC | PRN
  Administered 2018-10-11 (×5): 1 mg via INTRAVENOUS

## 2018-10-11 MED ORDER — SODIUM CHLORIDE 0.9 % WEIGHT BASED INFUSION
3.0000 mL/kg/h | INTRAVENOUS | Status: DC
  Administered 2018-10-11: 3 mL/kg/h via INTRAVENOUS

## 2018-10-11 MED ORDER — SODIUM CHLORIDE 0.9% FLUSH: 3.0000 mL | INTRAVENOUS | Status: DC | PRN

## 2018-10-11 MED ORDER — HEPARIN (PORCINE) IN NACL 1000-0.9 UT/500ML-% IV SOLN
INTRAVENOUS | Status: AC
  Filled 2018-10-11: qty 1000

## 2018-10-11 MED ORDER — HEPARIN (PORCINE) IN NACL 1000-0.9 UT/500ML-% IV SOLN
INTRAVENOUS | Status: DC | PRN
  Administered 2018-10-11 (×3): 500 mL

## 2018-10-11 MED ORDER — LIDOCAINE HCL (PF) 1 % IJ SOLN
INTRAMUSCULAR | Status: DC | PRN
  Administered 2018-10-11: 17 mL via INTRADERMAL

## 2018-10-11 MED ORDER — BIVALIRUDIN BOLUS VIA INFUSION - CUPID
INTRAVENOUS | Status: DC | PRN
  Administered 2018-10-11: 08:00:00 117.075 mg via INTRAVENOUS

## 2018-10-11 SURGICAL SUPPLY — 30 items
BALLN EMERGE MR 3.0X12 (BALLOONS) ×2
BALLN EUPHORA RX 2.0X12 (BALLOONS) ×2
BALLN SAPPHIRE 2.5X12 (BALLOONS) ×2
BALLN SAPPHIRE 3.0X20 (BALLOONS) ×2
BALLN ~~LOC~~ EUPHORA RX 3.0X12 (BALLOONS) ×2
BALLN ~~LOC~~ EUPHORA RX 3.5X12 (BALLOONS) ×2
BALLN ~~LOC~~ EUPHORA RX 4.0X12 (BALLOONS) ×2
BALLN ~~LOC~~ SAPPHIRE 4.5X8 (BALLOONS) ×2
BALLOON EMERGE MR 3.0X12 (BALLOONS) IMPLANT
BALLOON EUPHORA RX 2.0X12 (BALLOONS) IMPLANT
BALLOON SAPPHIRE 2.5X12 (BALLOONS) IMPLANT
BALLOON SAPPHIRE 3.0X20 (BALLOONS) IMPLANT
BALLOON ~~LOC~~ EUPHORA RX 3.0X12 (BALLOONS) IMPLANT
BALLOON ~~LOC~~ EUPHORA RX 3.5X12 (BALLOONS) IMPLANT
BALLOON ~~LOC~~ EUPHORA RX 4.0X12 (BALLOONS) IMPLANT
BALLOON ~~LOC~~ SAPPHIRE 4.5X8 (BALLOONS) IMPLANT
CATH LAUNCHER 6FR AL1 (CATHETERS) IMPLANT
CATHETER LAUNCHER 6FR AL1 (CATHETERS) ×2
DEVICE SPIDERFX EMB PROT 4MM (WIRE) ×1 IMPLANT
KIT ENCORE 26 ADVANTAGE (KITS) ×1 IMPLANT
KIT HEART LEFT (KITS) ×2 IMPLANT
PACK CARDIAC CATHETERIZATION (CUSTOM PROCEDURE TRAY) ×2 IMPLANT
SHEATH PINNACLE 6F 10CM (SHEATH) ×1 IMPLANT
SHEATH PROBE COVER 6X72 (BAG) ×1 IMPLANT
STENT SYNERGY DES 4X12 (Permanent Stent) ×1 IMPLANT
TRANSDUCER W/STOPCOCK (MISCELLANEOUS) ×2 IMPLANT
TUBING CIL FLEX 10 FLL-RA (TUBING) ×2 IMPLANT
WIRE ASAHI GRAND SLAM 180CM (WIRE) ×1 IMPLANT
WIRE ASAHI PROWATER 180CM (WIRE) ×1 IMPLANT
WIRE EMERALD 3MM-J .035X150CM (WIRE) ×1 IMPLANT

## 2018-10-11 NOTE — Progress Notes (Addendum)
Patient ID: [name], female   DOB: [birth year], 53 y.o.   MRN: [id number]     Primary Cardiologist :  Aundra Dubin   Subjective:    On 7/21 Underwent PCI of SVG - LAD with moderate success due to non-expandable lesion. Still with 45% residual stenosis. LCx felt to be too small to intervene on. Total contrast 55cc  Had recurrent CP today. Resolved with NTG. Sitting up eating breakfast. CVP 13-14. Denies SOB, orthopnea or PND  Creatinine 2.28 -> 2.04  Objective:   Weight Range: (!) 156.1 kg Body mass index is 53.91 kg/m.   Vital Signs:   Temp:  [97.5 F (36.4 C)-98.3 F (36.8 C)] 97.5 F (36.4 C) (07/21 1405) Pulse Rate:  [69-232] 73 (07/21 1709) Resp:  [0-29] 18 (07/21 1405) BP: (87-158)/(43-126) 126/74 (07/21 1709) SpO2:  [95 %-100 %] 100 % (07/21 1709) Weight:  [156.1 kg] 156.1 kg (07/21 0417) Last BM Date: 10/10/18  Weight change: Filed Weights   10/09/18 0611 10/10/18 0542 10/11/18 0417  Weight: (!) 153.4 kg (!) 155.2 kg (!) 156.1 kg    Intake/Output:   Intake/Output Summary (Last 24 hours) at 10/11/2018 1715 Last data filed at 10/11/2018 1709 Gross per 24 hour  Intake 2142.38 ml  Output 4450 ml  Net -2307.62 ml      Physical Exam    General:  Obese woman sitting in chair eating breakfast. No resp difficulty HEENT: normal + hirsuit Neck: supple. no JVD. Carotids 2+ bilat; no bruits. No lymphadenopathy or thryomegaly appreciated. Cor: PMI nondisplaced. Regular rate & rhythm. No rubs, gallops or murmurs. Lungs: clear Abdomen: obese soft, nontender, nondistended. No hepatosplenomegaly. No bruits or masses. Good bowel sounds. Extremities: no cyanosis, clubbing, rash, 1+ edema Neuro: alert & orientedx3, cranial nerves grossly intact. moves all 4 extremities w/o difficulty. Affect pleasant   Telemetry   NSR 70-80s (personally reviewed).   Labs    CBC Recent Labs    10/10/18 0426 10/11/18 0430  WBC 5.8 5.6  HGB 8.4* 8.5*  HCT 26.3* 26.7*  MCV 89.2  90.2  PLT 173 716   Basic Metabolic Panel Recent Labs    10/09/18 1204 10/11/18 0430  NA 136 139  K 4.8 4.2  CL 105 109  CO2 23 24  GLUCOSE 226* 198*  BUN 33* 33*  CREATININE 2.28* 2.04*  CALCIUM 9.2 8.8*    BNP: BNP (last 3 results) Recent Labs    10/01/18 2330  BNP 203.9*     Other results:  Cath 10/07/18 1. Normal left-sided filling pressures (PCWP and LVEDP) but elevated right-sided pressures suggesting primary RV failure. PAPI is adequate, good cardiac output, pulmonary venous hypertension.  2. Occluded SVG-RCA and native RCA with faint collaterals.  There does not appear to be a good option for intervention here.  3. 95% mid LCx stenosis.  4. Totally occluded mid LAD with 80% in-stent restenosis proximally in the SVG-LAD.  5. 35 cc contrast used (CKD).   I reviewed this study with Dr. Martinique.  The patient has significant angina with minimal exertion (walking part of the way up the hall).  She is not a candidate for redo CABG (unable to mobilize the LIMA with CABG #1).  I do not believe that we have any good way to treat her RCA system (the entire RCA from proximal to distal was stented before and is now occluded).  CTO approach to the LAD would be an option but difficult.  Initial plan, therefore, will be atherectomy +/- stenting  to the proximal/mid SVG-LAD.  PCI to mid LCx is also an option, would likely need to be staged.    Plan for PCI with Dr Martinique on Tuesday morning.  She will remain in hospital.  Gentle hydration tonight, will likely need some IV Lasix tomorrow.   Imaging     Medications:     Scheduled Medications: . amLODipine  2.5 mg Oral Daily  . aspirin EC  81 mg Oral Daily  . carvedilol  25 mg Oral BID WC  . citalopram  20 mg Oral Daily  . citalopram  30 mg Oral QHS  . febuxostat  80 mg Oral Daily  . fenofibrate  160 mg Oral Daily  . gabapentin  100 mg Oral 2 times per day  . gabapentin  200 mg Oral QHS  . heparin  5,000 Units Subcutaneous  Q8H  . insulin aspart  0-15 Units Subcutaneous TID WC  . insulin aspart  12 Units Subcutaneous TID WC  . insulin glargine  50 Units Subcutaneous BID  . isosorbide mononitrate  120 mg Oral Daily  . LORazepam  0.5 mg Oral Q8H  . omega-3 acid ethyl esters  2 g Oral BID  . pantoprazole  20 mg Oral Daily  . pneumococcal 23 valent vaccine  0.5 mL Intramuscular Tomorrow-1000  . prasugrel  10 mg Oral q morning - 10a  . ranolazine  1,000 mg Oral BID  . rosuvastatin  5 mg Oral Daily  . sodium chloride flush  10-40 mL Intracatheter Q12H  . sodium chloride flush  3 mL Intravenous Q12H  . sodium chloride flush  3 mL Intravenous Q12H  . sodium chloride flush  3 mL Intravenous Q12H  . sodium chloride flush  3 mL Intravenous Q12H    Infusions: . sodium chloride    . sodium chloride      PRN Medications: sodium chloride, sodium chloride, acetaminophen, alum & mag hydroxide-simeth, docusate sodium, nitroGLYCERIN, ondansetron (ZOFRAN) IV, oxyCODONE, sodium chloride flush, sodium chloride flush, sodium chloride flush   Assessment/Plan   1. CAD:  - underwent PCI of SVG to LAD 7/21 with moderate success with residual 45% stenosis. LCX felt too small to intervene upon.  - continue DAPT/statin/imdur/ranexa. - had recurrent CP this am resolved with NTG. Continue medical therapy. We discussed that there are no further revasc option. Also I am not convinced that all her CP is ischemic in nature - watch renal function post cath  2. Acute on chronic diastolic CHF: NYHA class III symptoms at home. Echo this admission with EF 55-60%, normal RV.  Left sided pressures normal on cath with PCWP 14 mmHg with increasing Cr post contrast - creatinine up today 2.0 -> 2.3 but this is baseline - Weight still up about 10-15 pounds will diurese gently with lasix 60IV daily watching creatinine closely. Goal is to balance diuresiis with renal recovery  3. Hyperlipidemia: Good LDL and TGs this admission.  On vascepa and  crestor  4. Obesity: - Long talk about absolute need for weight loss and she got quite angry with me about this. Says her diet is already pretty good    5.Venous insufficiency:  ABIs normal in 3/19. She has history of venous stasis ulcerations that continue to be present. No DVT on left on venous dopplers. ACE wrap on LLE plus one bilateral edema giving fluid today to protect kidneys and try to prevent postponement of intervention on Tuesday   6. AKI on CKD stage 3.   - creatinine 2.0 ->  2.3 - watch post-cath  - diurese gently - We discussed that she will liekly progress to HD in next 12-24 months  Length of Stay: [place], MD  10/11/2018, 5:15 PM

## 2018-10-11 NOTE — Interval H&P Note (Signed)
History and Physical Interval Note:  10/11/2018 7:32 AM  [name]  has presented today for surgery, with the diagnosis of chest pain.  The various methods of treatment have been discussed with the patient and family. After consideration of risks, benefits and other options for treatment, the patient has consented to  Procedure(s): CORONARY STENT INTERVENTION (N/A) as a surgical intervention.  The patient's history has been reviewed, patient examined, no change in status, stable for surgery.  I have reviewed the patient's chart and labs.  Questions were answered to the patient's satisfaction.    Cath Lab Visit (complete for each Cath Lab visit)  Clinical Evaluation Leading to the Procedure:   ACS: Yes.    Non-ACS:    Anginal Classification: CCS IV  Anti-ischemic medical therapy: Maximal Therapy (2 or more classes of medications)  Non-Invasive Test Results: Intermediate-risk stress test findings: cardiac mortality 1-3%/year  Prior CABG: Previous CABG       Collier [facility] 10/11/2018 7:33 AM

## 2018-10-11 NOTE — Progress Notes (Signed)
Sheath Pull, rt femoral artery Hemostasis time @ 1240 Manual pressure held for 20 mins. Dressed guaze and tegaderm.

## 2018-10-12 ENCOUNTER — Encounter (HOSPITAL_COMMUNITY): Payer: Self-pay | Admitting: Cardiology

## 2018-10-12 LAB — COOXEMETRY PANEL
Carboxyhemoglobin: 1.8 % — ABNORMAL HIGH (ref 0.5–1.5)
Methemoglobin: 1.5 % (ref 0.0–1.5)
O2 Saturation: 62.9 %
Total hemoglobin: 8.7 g/dL — ABNORMAL LOW (ref 12.0–16.0)

## 2018-10-12 LAB — CBC
HCT: 26.1 % — ABNORMAL LOW (ref 36.0–46.0)
Hemoglobin: 8.4 g/dL — ABNORMAL LOW (ref 12.0–15.0)
MCH: 29 pg (ref 26.0–34.0)
MCHC: 32.2 g/dL (ref 30.0–36.0)
MCV: 90 fL (ref 80.0–100.0)
Platelets: 188 10*3/uL (ref 150–400)
RBC: 2.9 MIL/uL — ABNORMAL LOW (ref 3.87–5.11)
RDW: 12.9 % (ref 11.5–15.5)
WBC: 6.7 10*3/uL (ref 4.0–10.5)
nRBC: 0 % (ref 0.0–0.2)

## 2018-10-12 LAB — GLUCOSE, CAPILLARY
Glucose-Capillary: 187 mg/dL — ABNORMAL HIGH (ref 70–99)
Glucose-Capillary: 199 mg/dL — ABNORMAL HIGH (ref 70–99)
Glucose-Capillary: 158 mg/dL — ABNORMAL HIGH (ref 70–99)
Glucose-Capillary: 202 mg/dL — ABNORMAL HIGH (ref 70–99)

## 2018-10-12 LAB — BASIC METABOLIC PANEL
Anion gap: 6 (ref 5–15)
BUN: 30 mg/dL — ABNORMAL HIGH (ref 6–20)
CO2: 26 mmol/L (ref 22–32)
Calcium: 9 mg/dL (ref 8.9–10.3)
Chloride: 108 mmol/L (ref 98–111)
Creatinine, Ser: 2.31 mg/dL — ABNORMAL HIGH (ref 0.44–1.00)
GFR calc Af Amer: 27 mL/min — ABNORMAL LOW (ref >60)
GFR calc non Af Amer: 23 mL/min — ABNORMAL LOW (ref >60)
Glucose, Bld: 197 mg/dL — ABNORMAL HIGH (ref 70–99)
Potassium: 4.1 mmol/L (ref 3.5–5.1)
Sodium: 140 mmol/L (ref 135–145)

## 2018-10-12 MED ORDER — FUROSEMIDE 10 MG/ML IJ SOLN
60.0000 mg | Freq: Once | INTRAMUSCULAR | Status: AC
  Administered 2018-10-12: 09:00:00 60 mg via INTRAVENOUS

## 2018-10-12 NOTE — Progress Notes (Signed)
CARDIAC REHAB PHASE I   PRE:  Rate/Rhythm: 82 SR  BP:  Supine:   Sitting: 124/66  Standing:    SaO2: 98%RA  MODE:  Ambulation: 250 ft   POST:  Rate/Rhythm: 104 ST  BP:  Supine:   Sitting: 125/56  Standing:    SaO2: 100%RA 1447-1525 Came earlier and pt requested that we return. Pt walked 250 ft on RA with rollator and asst  2. Pt stopped twice to rest and sit with rollator. Showed pt how to lock and sit. Would recommend rollator for home use. Pt gets very DOE. Also pt may benefit from PT eval for energy conservation. Pt appreciative of walk.    Graylon Good, RN BSN  10/12/2018 3:20 PM

## 2018-10-13 DIAGNOSIS — I2510 Atherosclerotic heart disease of native coronary artery without angina pectoris: Secondary | ICD-10-CM

## 2018-10-13 LAB — BASIC METABOLIC PANEL
Anion gap: 11 (ref 5–15)
BUN: 37 mg/dL — ABNORMAL HIGH (ref 6–20)
CO2: 23 mmol/L (ref 22–32)
Calcium: 8.9 mg/dL (ref 8.9–10.3)
Chloride: 104 mmol/L (ref 98–111)
Creatinine, Ser: 2.51 mg/dL — ABNORMAL HIGH (ref 0.44–1.00)
GFR calc Af Amer: 24 mL/min — ABNORMAL LOW (ref >60)
GFR calc non Af Amer: 21 mL/min — ABNORMAL LOW (ref >60)
Glucose, Bld: 194 mg/dL — ABNORMAL HIGH (ref 70–99)
Potassium: 4.1 mmol/L (ref 3.5–5.1)
Sodium: 138 mmol/L (ref 135–145)

## 2018-10-13 LAB — GLUCOSE, CAPILLARY
Glucose-Capillary: 197 mg/dL — ABNORMAL HIGH (ref 70–99)
Glucose-Capillary: 218 mg/dL — ABNORMAL HIGH (ref 70–99)
Glucose-Capillary: 175 mg/dL — ABNORMAL HIGH (ref 70–99)
Glucose-Capillary: 169 mg/dL — ABNORMAL HIGH (ref 70–99)

## 2018-10-13 NOTE — Progress Notes (Signed)
Patient ID: [name], female   DOB: 06-06-1965, 53 y.o.   MRN: [id number]     Primary Cardiologist :  Aundra Dubin   Subjective:    On 7/21 Underwent PCI of SVG - LAD with moderate success due to non-expandable lesion. Still with 45% residual stenosis. LCx felt to be too small to intervene on. Total contrast 55cc  Received 60 IV lasix yesterday x 1 with over 3.5 L out but gained 1 pound. Still about 10 pounds up from baseline. Still with occasional CP. Walked with CR yesterday and had significant DOE.   Creatinine up 2.3 -> 2.5   Objective:   Weight Range: (!) 154 kg Body mass index is 53.17 kg/m.   Vital Signs:   Temp:  [97.5 F (36.4 C)-98.8 F (37.1 C)] 98.8 F (37.1 C) (07/23 0540) Pulse Rate:  [82-98] 98 (07/23 0540) Resp:  [18] 18 (07/23 0540) BP: (116-128)/(56-85) 125/64 (07/23 0540) SpO2:  [99 %-100 %] 99 % (07/23 0540) Weight:  [154 kg] 154 kg (07/23 0534) Last BM Date: 10/12/18  Weight change: Filed Weights   10/11/18 0417 10/12/18 0500 10/13/18 0534  Weight: (!) 156.1 kg (!) 153.7 kg (!) 154 kg    Intake/Output:   Intake/Output Summary (Last 24 hours) at 10/13/2018 0933 Last data filed at 10/13/2018 0545 Gross per 24 hour  Intake 844 ml  Output 3650 ml  Net -2806 ml      Physical Exam    General:  Obese woman sitting in chair. No resp difficulty HEENT: normal + hirsuit Neck: supple. JVP to jaw . Carotids 2+ bilat; no bruits. No lymphadenopathy or thryomegaly appreciated. Cor: PMI nondisplaced. Regular rate & rhythm. No rubs, gallops or murmurs. Lungs: clear Abdomen: obese soft, nontender, nondistended. No hepatosplenomegaly. No bruits or masses. Good bowel sounds. Extremities: no cyanosis, clubbing, rash, 1-2+ edema Neuro: alert & orientedx3, cranial nerves grossly intact. moves all 4 extremities w/o difficulty. Affect pleasant    Telemetry   NSR 70-80s (personally reviewed).   Labs    CBC Recent Labs    10/11/18 0430 10/12/18 0432  WBC  5.6 6.7  HGB 8.5* 8.4*  HCT 26.7* 26.1*  MCV 90.2 90.0  PLT 192 707   Basic Metabolic Panel Recent Labs    10/12/18 0432 10/13/18 0455  NA 140 138  K 4.1 4.1  CL 108 104  CO2 26 23  GLUCOSE 197* 194*  BUN 30* 37*  CREATININE 2.31* 2.51*  CALCIUM 9.0 8.9    BNP: BNP (last 3 results) Recent Labs    10/01/18 2330  BNP 203.9*     Other results:  Cath 10/07/18 1. Normal left-sided filling pressures (PCWP and LVEDP) but elevated right-sided pressures suggesting primary RV failure. PAPI is adequate, good cardiac output, pulmonary venous hypertension.  2. Occluded SVG-RCA and native RCA with faint collaterals.  There does not appear to be a good option for intervention here.  3. 95% mid LCx stenosis.  4. Totally occluded mid LAD with 80% in-stent restenosis proximally in the SVG-LAD.  5. 35 cc contrast used (CKD).    Imaging     Medications:     Scheduled Medications: . amLODipine  2.5 mg Oral Daily  . aspirin EC  81 mg Oral Daily  . carvedilol  25 mg Oral BID WC  . citalopram  20 mg Oral Daily  . citalopram  30 mg Oral QHS  . febuxostat  80 mg Oral Daily  . fenofibrate  160 mg Oral Daily  .  gabapentin  100 mg Oral 2 times per day  . gabapentin  200 mg Oral QHS  . heparin  5,000 Units Subcutaneous Q8H  . insulin aspart  0-15 Units Subcutaneous TID WC  . insulin aspart  12 Units Subcutaneous TID WC  . insulin glargine  50 Units Subcutaneous BID  . isosorbide mononitrate  120 mg Oral Daily  . LORazepam  0.5 mg Oral Q8H  . omega-3 acid ethyl esters  2 g Oral BID  . pantoprazole  20 mg Oral Daily  . pneumococcal 23 valent vaccine  0.5 mL Intramuscular Tomorrow-1000  . prasugrel  10 mg Oral q morning - 10a  . ranolazine  1,000 mg Oral BID  . rosuvastatin  5 mg Oral Daily  . sodium chloride flush  10-40 mL Intracatheter Q12H    Infusions: . sodium chloride    . sodium chloride      PRN Medications: sodium chloride, sodium chloride, acetaminophen, alum &  mag hydroxide-simeth, docusate sodium, nitroGLYCERIN, ondansetron (ZOFRAN) IV, oxyCODONE, sodium chloride flush, sodium chloride flush   Assessment/Plan   1. CAD:  - underwent PCI of SVG to LAD 7/21 with moderate success with residual 45% stenosis. LCX felt too small to intervene upon.  - continue DAPT/statin/imdur/ranexa. - having episodes of recurrent CP resolved with NTG. Continue medical therapy. We discussed that there are no further revasc option. Also I am not convinced that all her  - watch renal function post cath  2. Acute on chronic diastolic CHF: NYHA class III symptoms at home. Echo this admission with EF 55-60%, normal RV.  Left sided pressures normal on cath with PCWP 14 mmHg with increasing Cr post contrast - creatinine up today 2.0 -> 2.3 -> 2.5 but this is still close to baseline - Diruesed well yesterday but weight up due to high intake. Weight still up about 10 pounds from baseline but with rising creatinine will hold lasix today. Fluid restrict to 1800   3. Hyperlipidemia: Good LDL and TGs this admission.  On vascepa and crestor  4. Obesity: - Long talk about absolute need for weight loss and she got quite angry with me about this. Says her diet is already pretty good    5.Venous insufficiency:  ABIs normal in 3/19. She has history of venous stasis ulcerations that continue to be present. No DVT on left on venous dopplers. ACE wrap on LLE plus one bilateral edema giving fluid today to protect kidneys and try to prevent postponement of intervention on Tuesday   6. AKI on CKD stage 3.   - creatinine 2.0 -> 2.3 -> 2.5 (baseline likely 2.3-2.6) - watch post-cath  - hold diuretics today - We discussed that she will liekly progress to HD in next 12-24 months  Length of Stay: [place], MD  10/13/2018, 9:33 AM

## 2018-10-14 LAB — BASIC METABOLIC PANEL
Anion gap: 8 (ref 5–15)
BUN: 34 mg/dL — ABNORMAL HIGH (ref 6–20)
CO2: 24 mmol/L (ref 22–32)
Calcium: 9.1 mg/dL (ref 8.9–10.3)
Chloride: 107 mmol/L (ref 98–111)
Creatinine, Ser: 2.15 mg/dL — ABNORMAL HIGH (ref 0.44–1.00)
GFR calc Af Amer: 29 mL/min — ABNORMAL LOW (ref >60)
GFR calc non Af Amer: 25 mL/min — ABNORMAL LOW (ref >60)
Glucose, Bld: 185 mg/dL — ABNORMAL HIGH (ref 70–99)
Potassium: 4.2 mmol/L (ref 3.5–5.1)
Sodium: 139 mmol/L (ref 135–145)

## 2018-10-14 LAB — GLUCOSE, CAPILLARY
Glucose-Capillary: 167 mg/dL — ABNORMAL HIGH (ref 70–99)
Glucose-Capillary: 158 mg/dL — ABNORMAL HIGH (ref 70–99)
Glucose-Capillary: 190 mg/dL — ABNORMAL HIGH (ref 70–99)
Glucose-Capillary: 173 mg/dL — ABNORMAL HIGH (ref 70–99)

## 2018-10-14 MED ORDER — FUROSEMIDE 10 MG/ML IJ SOLN
60.0000 mg | Freq: Two times a day (BID) | INTRAMUSCULAR | Status: DC
  Administered 2018-10-14 – 2018-10-15 (×3): 60 mg via INTRAVENOUS
  Filled 2018-10-14 (×3): qty 6

## 2018-10-14 MED ORDER — FUROSEMIDE 10 MG/ML IJ SOLN: 60.0000 mg | Freq: Once | INTRAMUSCULAR | Status: DC

## 2018-10-14 MED ORDER — METHOCARBAMOL 500 MG PO TABS
500.0000 mg | ORAL_TABLET | Freq: Once | ORAL | Status: AC
  Administered 2018-10-14: 500 mg via ORAL
  Filled 2018-10-14: qty 1

## 2018-10-14 NOTE — Progress Notes (Signed)
Patient ID: [name], female   DOB: 01-06-1966, 53 y.o.   MRN: [id number]     Primary Cardiologist :  Aundra Dubin   Subjective:    On 7/21 Underwent PCI of SVG - LAD with moderate success due to non-expandable lesion. Still with 45% residual stenosis. LCx felt to be too small to intervene on. Total contrast 55cc   Lasix held yesterday. Weight up 3-4 pounds. Creatinine back down to 2.1. Denies CP. Feels bloated and SOB. + LE edema. VERY defensive about her diet when talking to me and also CR. Resistant to dietary modification.    Objective:   Weight Range: (!) 155.2 kg Body mass index is 53.59 kg/m.   Vital Signs:   Temp:  [97.9 F (36.6 C)-98.4 F (36.9 C)] 97.9 F (36.6 C) (07/24 1514) Pulse Rate:  [74-83] 74 (07/24 1514) BP: (116-145)/(35-67) 131/60 (07/24 1309) SpO2:  [100 %] 100 % (07/24 1514) Weight:  [155.2 kg] 155.2 kg (07/24 0500) Last BM Date: 10/14/18  Weight change: Filed Weights   10/12/18 0500 10/13/18 0534 10/14/18 0500  Weight: (!) 153.7 kg (!) 154 kg (!) 155.2 kg    Intake/Output:   Intake/Output Summary (Last 24 hours) at 10/14/2018 1600 Last data filed at 10/14/2018 1529 Gross per 24 hour  Intake 702 ml  Output 2600 ml  Net -1898 ml      Physical Exam    General:  Obese woman sitting in chair. No resp difficulty HEENT: normal x for hirsuitism Neck: supple. JVP to jaw  Carotids 2+ bilat; no bruits. No lymphadenopathy or thryomegaly appreciated. Cor: PMI nondisplaced. Regular rate & rhythm. No rubs, gallops or murmurs. Lungs: clear Abdomen: obese soft, nontender, nondistended. No hepatosplenomegaly. No bruits or masses. Good bowel sounds. Extremities: no cyanosis, clubbing, rash, 2-3+ edema Neuro: alert & orientedx3, cranial nerves grossly intact. moves all 4 extremities w/o difficulty. Affect pleasant   Telemetry   NSR 70-80s (personally reviewed).   Labs    CBC Recent Labs    10/12/18 0432  WBC 6.7  HGB 8.4*  HCT 26.1*  MCV 90.0   PLT 093   Basic Metabolic Panel Recent Labs    10/13/18 0455 10/14/18 0500  NA 138 139  K 4.1 4.2  CL 104 107  CO2 23 24  GLUCOSE 194* 185*  BUN 37* 34*  CREATININE 2.51* 2.15*  CALCIUM 8.9 9.1    BNP: BNP (last 3 results) Recent Labs    10/01/18 2330  BNP 203.9*     Other results:  Cath 10/07/18 1. Normal left-sided filling pressures (PCWP and LVEDP) but elevated right-sided pressures suggesting primary RV failure. PAPI is adequate, good cardiac output, pulmonary venous hypertension.  2. Occluded SVG-RCA and native RCA with faint collaterals.  There does not appear to be a good option for intervention here.  3. 95% mid LCx stenosis.  4. Totally occluded mid LAD with 80% in-stent restenosis proximally in the SVG-LAD.  5. 35 cc contrast used (CKD).    Imaging     Medications:     Scheduled Medications: . amLODipine  2.5 mg Oral Daily  . aspirin EC  81 mg Oral Daily  . carvedilol  25 mg Oral BID WC  . citalopram  20 mg Oral Daily  . citalopram  30 mg Oral QHS  . febuxostat  80 mg Oral Daily  . fenofibrate  160 mg Oral Daily  . furosemide  60 mg Intravenous BID  . gabapentin  100 mg Oral 2 times per  day  . gabapentin  200 mg Oral QHS  . heparin  5,000 Units Subcutaneous Q8H  . insulin aspart  0-15 Units Subcutaneous TID WC  . insulin aspart  12 Units Subcutaneous TID WC  . insulin glargine  50 Units Subcutaneous BID  . isosorbide mononitrate  120 mg Oral Daily  . LORazepam  0.5 mg Oral Q8H  . omega-3 acid ethyl esters  2 g Oral BID  . pantoprazole  20 mg Oral Daily  . pneumococcal 23 valent vaccine  0.5 mL Intramuscular Tomorrow-1000  . prasugrel  10 mg Oral q morning - 10a  . ranolazine  1,000 mg Oral BID  . rosuvastatin  5 mg Oral Daily  . sodium chloride flush  10-40 mL Intracatheter Q12H    Infusions: . sodium chloride    . sodium chloride      PRN Medications: sodium chloride, sodium chloride, acetaminophen, alum & mag hydroxide-simeth,  docusate sodium, nitroGLYCERIN, ondansetron (ZOFRAN) IV, oxyCODONE, sodium chloride flush, sodium chloride flush   Assessment/Plan   1. CAD:  - underwent PCI of SVG to LAD 7/21 with moderate success with residual 45% stenosis. LCX felt too small to intervene upon.  - continue DAPT/statin/imdur/ranexa/b-blocker - Creatinine stable - She does have intermittent CP but not sure that it is all related myocardial ischemia.  - No CP today  2. Acute on chronic diastolic CHF: NYHA class III symptoms at home. Echo this admission with EF 55-60%, normal RV.  Left sided pressures normal on cath with PCWP 14 mmHg with increasing Cr post contrast - creatinine  2.0 -> 2.3 -> 2.5 -> 2.1 but this is still close to baseline - renal function stable after cath. Weight up 10-15 pounds. Will give lasix 60mg  IV bid today.   3. Hyperlipidemia: Good LDL and TGs this admission.  On vascepa and crestor  4. Obesity: - Long talk about absolute need for weight loss and she got quite angry with me about this. Says her diet is already pretty good. She was very defensive about that to me and CR.   5.Venous insufficiency:  ABIs normal in 3/19. She has history of venous stasis ulcerations that continue to be present. No DVT on left on venous dopplers. ACE wrap on LLE plus one bilateral edema giving fluid today to protect kidneys and try to prevent postponement of intervention on Tuesday   6. AKI on CKD stage 3.   - creatinine  2.0 -> 2.3 -> 2.5 -> 2.1 but this is still close to baseline - renal function stable after cath. Weight up 10-15 pounds. Will give lasix 60mg  IV bid today.  - We discussed that she will liekly progress to HD in next 12-24 months  Length of Stay: [place], MD  10/14/2018, 4:00 PM

## 2018-10-14 NOTE — Care Management Important Message (Signed)
Important Message  Patient Details  Name: [name] MRN: [id number] Date of Birth: 12/30/1965   Medicare Important Message Given:  Yes pt. On precautions gave to his nurse [name].     [name] 10/14/2018, 1:36 PM

## 2018-10-14 NOTE — Progress Notes (Addendum)
CARDIAC REHAB PHASE I   Offered to walk with pt, pt declining due to eating lunch. Talked with pt at length about HF, and ways to manage in the home setting. Pt does not believe she has HF, and declined a HF booklet. Talked with pt about importance of daily weights, sodium and fluid restrictions. Pt insistent she does not use salt but when asked what she eats, pt admits she does not cook for herself and eats out a lot. Pt educated on foods that tend to be high in sodium, pt still does not see a problem with her diet. Pt also needs reinforcement on understanding fluid restrictions. Pt believes restrictions are on beverages like lemonade and iced tea, but that she can have as much water as she wants. When trying to correct pt, pt quick to get defensive. Offered to walk pt again at end of session, pt continues to decline stating she just ate, and doesn't like to be rushed. PT consult requested, along with rollator for home use. Will continue to follow and try to continue to encourage walks, and reinforce education.  Pt states understanding of importance of taking Effient with stent. Pt referred to [place].  [id number] Rufina Falco, RN BSN 10/14/2018 2:26 PM

## 2018-10-15 LAB — BASIC METABOLIC PANEL
Anion gap: 8 (ref 5–15)
BUN: 39 mg/dL — ABNORMAL HIGH (ref 6–20)
CO2: 23 mmol/L (ref 22–32)
Calcium: 9.2 mg/dL (ref 8.9–10.3)
Chloride: 107 mmol/L (ref 98–111)
Creatinine, Ser: 2.44 mg/dL — ABNORMAL HIGH (ref 0.44–1.00)
GFR calc Af Amer: 25 mL/min — ABNORMAL LOW (ref >60)
GFR calc non Af Amer: 22 mL/min — ABNORMAL LOW (ref >60)
Glucose, Bld: 148 mg/dL — ABNORMAL HIGH (ref 70–99)
Potassium: 4.4 mmol/L (ref 3.5–5.1)
Sodium: 138 mmol/L (ref 135–145)

## 2018-10-15 LAB — COOXEMETRY PANEL
Carboxyhemoglobin: 1.6 % — ABNORMAL HIGH (ref 0.5–1.5)
Methemoglobin: 1.4 % (ref 0.0–1.5)
O2 Saturation: 53.2 %
Total hemoglobin: 8.7 g/dL — ABNORMAL LOW (ref 12.0–16.0)

## 2018-10-15 LAB — GLUCOSE, CAPILLARY
Glucose-Capillary: 155 mg/dL — ABNORMAL HIGH (ref 70–99)
Glucose-Capillary: 175 mg/dL — ABNORMAL HIGH (ref 70–99)
Glucose-Capillary: 134 mg/dL — ABNORMAL HIGH (ref 70–99)
Glucose-Capillary: 185 mg/dL — ABNORMAL HIGH (ref 70–99)
Glucose-Capillary: 192 mg/dL — ABNORMAL HIGH (ref 70–99)

## 2018-10-15 MED ORDER — TORSEMIDE 20 MG PO TABS
40.0000 mg | ORAL_TABLET | Freq: Two times a day (BID) | ORAL | Status: DC
  Administered 2018-10-15: 19:00:00 40 mg via ORAL
  Filled 2018-10-15: qty 2

## 2018-10-15 MED ORDER — METHOCARBAMOL 500 MG PO TABS
500.0000 mg | ORAL_TABLET | Freq: Two times a day (BID) | ORAL | Status: DC | PRN
  Administered 2018-10-15 – 2018-10-17 (×4): 500 mg via ORAL
  Filled 2018-10-15 (×4): qty 1

## 2018-10-15 NOTE — Progress Notes (Signed)
Orthopedic Tech Progress Note Patient Details:  [name] 12/19/1965 [id number]  Ortho Devices Type of Ortho Device: Haematologist, Ace wrap Ortho Device/Splint Location: Bilateral unna boots Ortho Device/Splint Interventions: Application   Post Interventions Patient Tolerated: Well Instructions Provided: Care of device   [place] Pink 10/15/2018, 6:55 PM

## 2018-10-15 NOTE — Progress Notes (Signed)
PT Cancellation Note  Patient Details Name: [name] MRN: [id number] DOB: 04/06/1965   Cancelled Treatment:    Reason Eval/Treat Not Completed: Pain limiting ability to participate.  Pt reporting back spasms today.  RN aware and communicating with MD re: pt's request for muscle relaxer.  PT provided hot packs to try to help with pain.  Pt not up for walking right now.  PT to check back tomorrow.  Thanks,  Barbarann Ehlers. Joseandres Mazer, PT, DPT  Acute Rehabilitation [phone] pager [phone] office  @ [facility]: [phone]     Harvie Heck 10/15/2018, 11:00 AM

## 2018-10-15 NOTE — Progress Notes (Addendum)
Patient ID: [name], female   DOB: [birth year], 53 y.o.   MRN: [id number]     Primary Cardiologist :  Aundra Dubin   Subjective:    On 7/21 Underwent PCI of SVG - LAD with moderate success due to non-expandable lesion. Still with 45% residual stenosis. LCx felt to be too small to intervene on. Total contrast 55cc   Lasix restarted yesterday. Given 60 IV bid. Out almost 3L but weight down only 1 pound. Creatinine 2.1 -> 2.4. CVP 14. Denies CP. Says she still feels bloated and SOB with ambulating room.  C/o back spasms and wants robaxin   VERY defensive about her diet when talking to me and also CR. Resistant to dietary modification.    Objective:   Weight Range: (!) 154.9 kg Body mass index is 53.47 kg/m.   Vital Signs:   Temp:  [97.8 F (36.6 C)-98.9 F (37.2 C)] 98.9 F (37.2 C) (07/25 0417) Pulse Rate:  [74-88] 85 (07/25 0417) BP: (124-131)/(60-69) 124/67 (07/25 0417) SpO2:  [100 %] 100 % (07/25 0417) Weight:  [154.9 kg] 154.9 kg (07/25 0417) Last BM Date: 10/14/18  Weight change: Filed Weights   10/13/18 0534 10/14/18 0500 10/15/18 0417  Weight: (!) 154 kg (!) 155.2 kg (!) 154.9 kg    Intake/Output:   Intake/Output Summary (Last 24 hours) at 10/15/2018 1103 Last data filed at 10/15/2018 0900 Gross per 24 hour  Intake 682 ml  Output 2600 ml  Net -1918 ml      Physical Exam    General:  Obese woman sitting in chair. No resp difficulty HEENT: normal x for hirsuitism  Neck: supple. JVP hard to see. Carotids 2+ bilat; no bruits. No lymphadenopathy or thryomegaly appreciated. Cor: PMI nondisplaced. Regular rate & rhythm. No rubs, gallops or murmurs. Lungs: clear Abdomen: markedly obese soft, nontender, nondistended. No hepatosplenomegaly. No bruits or masses. Good bowel sounds. Extremities: no cyanosis, clubbing, rash, 1-2+ edema Neuro: alert & orientedx3, cranial nerves grossly intact. moves all 4 extremities w/o difficulty. Affect pleasant   Telemetry   NSR  80s (personally reviewed).   Labs    CBC No results for input(s): WBC, NEUTROABS, HGB, HCT, MCV, PLT in the last 72 hours. Basic Metabolic Panel Recent Labs    10/14/18 0500 10/15/18 0455  NA 139 138  K 4.2 4.4  CL 107 107  CO2 24 23  GLUCOSE 185* 148*  BUN 34* 39*  CREATININE 2.15* 2.44*  CALCIUM 9.1 9.2    BNP: BNP (last 3 results) Recent Labs    10/01/18 2330  BNP 203.9*     Other results:  Cath 10/07/18 1. Normal left-sided filling pressures (PCWP and LVEDP) but elevated right-sided pressures suggesting primary RV failure. PAPI is adequate, good cardiac output, pulmonary venous hypertension.  2. Occluded SVG-RCA and native RCA with faint collaterals.  There does not appear to be a good option for intervention here.  3. 95% mid LCx stenosis.  4. Totally occluded mid LAD with 80% in-stent restenosis proximally in the SVG-LAD.  5. 35 cc contrast used (CKD).    Medications:     Scheduled Medications: . amLODipine  2.5 mg Oral Daily  . aspirin EC  81 mg Oral Daily  . carvedilol  25 mg Oral BID WC  . citalopram  20 mg Oral Daily  . citalopram  30 mg Oral QHS  . febuxostat  80 mg Oral Daily  . fenofibrate  160 mg Oral Daily  . furosemide  60 mg Intravenous BID  .  gabapentin  100 mg Oral 2 times per day  . gabapentin  200 mg Oral QHS  . heparin  5,000 Units Subcutaneous Q8H  . insulin aspart  0-15 Units Subcutaneous TID WC  . insulin aspart  12 Units Subcutaneous TID WC  . insulin glargine  50 Units Subcutaneous BID  . isosorbide mononitrate  120 mg Oral Daily  . LORazepam  0.5 mg Oral Q8H  . omega-3 acid ethyl esters  2 g Oral BID  . pantoprazole  20 mg Oral Daily  . pneumococcal 23 valent vaccine  0.5 mL Intramuscular Tomorrow-1000  . prasugrel  10 mg Oral q morning - 10a  . ranolazine  1,000 mg Oral BID  . rosuvastatin  5 mg Oral Daily  . sodium chloride flush  10-40 mL Intracatheter Q12H    Infusions: . sodium chloride    . sodium chloride       PRN Medications: sodium chloride, sodium chloride, acetaminophen, alum & mag hydroxide-simeth, docusate sodium, nitroGLYCERIN, ondansetron (ZOFRAN) IV, oxyCODONE, sodium chloride flush, sodium chloride flush   Assessment/Plan   1. CAD:  - underwent PCI of SVG to LAD 7/21 with moderate success with residual 45% stenosis. LCX felt too small to intervene upon.  - continue DAPT/statin/imdur/ranexa/b-blocker - Creatinine stable - She does have intermittent CP but not sure that it is all related myocardial ischemia.  - No CP currently  2. Acute on chronic diastolic CHF: NYHA class III symptoms at home. Echo this admission with EF 55-60%, normal RV.  Left sided pressures normal on cath with PCWP 14 mmHg with increasing Cr post contrast - creatinine  2.0 -> 2.3 -> 2.5 -> 2.1 -> 2.4 but this is still close to baseline - Received IV lasix 60 IV bid with good urine output but weight only down a pound. Suspect significant po intake despite fluid restriction - Weight still up 13 pounds from pre-cath. I am going to put her back on torsemide 40 bid (was on 40/20 PTA) and if she responds can go home soon and diurese gently on po torsemide - Place UNNA boots  3. Hyperlipidemia: Good LDL and TGs this admission.  On vascepa and crestor  4. Obesity: - Long talk about absolute need for weight loss and she got quite angry with me about this. Says her diet is already pretty good. She was very defensive about that to me and CR.   5.Venous insufficiency:  ABIs normal in 3/19. She has history of venous stasis ulcerations that continue to be present. No DVT on left on venous dopplers. ACE wrap on LLE plus one bilateral edema giving fluid today to protect kidneys and try to prevent postponement of intervention on Tuesday   6. AKI on CKD stage 3.   - creatinine  2.0 -> 2.3 -> 2.5 -> 2.1 -> 2.4 but this is still close to baseline - see plan above - We discussed that she will liekly progress to HD in next 12-24  months. She denies this and says she has faith in her religion that this won't happen  7. Back spasms - start robaxin  Length of Stay: [place], MD  10/15/2018, 11:03 AM

## 2018-10-16 ENCOUNTER — Other Ambulatory Visit (HOSPITAL_COMMUNITY): Payer: Self-pay | Admitting: Cardiology

## 2018-10-16 LAB — GLUCOSE, CAPILLARY
Glucose-Capillary: 117 mg/dL — ABNORMAL HIGH (ref 70–99)
Glucose-Capillary: 66 mg/dL — ABNORMAL LOW (ref 70–99)
Glucose-Capillary: 133 mg/dL — ABNORMAL HIGH (ref 70–99)
Glucose-Capillary: 182 mg/dL — ABNORMAL HIGH (ref 70–99)
Glucose-Capillary: 203 mg/dL — ABNORMAL HIGH (ref 70–99)
Glucose-Capillary: 169 mg/dL — ABNORMAL HIGH (ref 70–99)

## 2018-10-16 LAB — BASIC METABOLIC PANEL
Anion gap: 8 (ref 5–15)
BUN: 42 mg/dL — ABNORMAL HIGH (ref 6–20)
CO2: 25 mmol/L (ref 22–32)
Calcium: 9.1 mg/dL (ref 8.9–10.3)
Chloride: 104 mmol/L (ref 98–111)
Creatinine, Ser: 2.45 mg/dL — ABNORMAL HIGH (ref 0.44–1.00)
GFR calc Af Amer: 25 mL/min — ABNORMAL LOW (ref >60)
GFR calc non Af Amer: 22 mL/min — ABNORMAL LOW (ref >60)
Glucose, Bld: 185 mg/dL — ABNORMAL HIGH (ref 70–99)
Potassium: 4.1 mmol/L (ref 3.5–5.1)
Sodium: 137 mmol/L (ref 135–145)

## 2018-10-16 MED ORDER — TORSEMIDE 20 MG PO TABS
60.0000 mg | ORAL_TABLET | Freq: Two times a day (BID) | ORAL | Status: DC
  Administered 2018-10-16 – 2018-10-17 (×3): 60 mg via ORAL
  Filled 2018-10-16 (×3): qty 3

## 2018-10-16 NOTE — Plan of Care (Signed)
  Problem: Clinical Measurements: Goal: Ability to maintain clinical measurements within normal limits will improve Outcome: Progressing Goal: Diagnostic test results will improve Outcome: Progressing   Problem: Activity: Goal: Risk for activity intolerance will decrease Outcome: Progressing   

## 2018-10-16 NOTE — Evaluation (Signed)
Physical Therapy Evaluation Patient Details Name: [name] MRN: [id number] DOB: May 12, 1965 Today's Date: 10/16/2018   History of Present Illness    53 yo female with history of CAD s/p CABG (2010) and multiple PCI, HFpEF, DM2, CKD, HTN, HLD, morbid obesity, who presents as a transfer from [facility] with chest pain. On 7/21 Underwent PCI of SVG - LAD with moderate success due to non-expandable lesion.    Clinical Impression  Pt presents with moderate limitations to functional mobility due to cardiopulmonary dysfunction, weakness, deconditioning, limited ROM, exacerbated by back spasms, resulting in limited ability to change positions and move around.  Pt requires minimal to no physical assistance but cannot tolerate more that 5 minutes continuous activity currently.  Lives in a home with 5 steps to enter and anticipates being alone as children work.  Pt voiced concerns about preparing own food but otherwise denies worries about being home without assistance.  Discussed benefit from short SNF stay, pt clearly declined, was asked to consider the option anyway for the sake of safety and optimal function once home.  Should pt decline SNF, she will need HHPT (along with other services as indicated by medical team) and bariatric sized rollator (4 wheeled walker with seat).  PT will initiate care in acute setting and assist with d/c plan as able.      Follow Up Recommendations SNF    Equipment Recommendations  Other (comment)(Rollator - bariatric please)    Recommendations for Other Services       Precautions / Restrictions Precautions Precautions: Fall Precaution Comments: use device for support Restrictions Weight Bearing Restrictions: No      Mobility  Bed Mobility               General bed mobility comments: did not observe, pt is on air mattress and described difficulty due to the mattress; up in chair and did not want to go back to bed at session end  Transfers Overall  transfer level: Modified independent Equipment used: None             General transfer comment: pt takes pivotal steps chair>BSC and back unassisted, stands without need for help and is steady, waits for a moment before stepping off  Ambulation/Gait Ambulation/Gait assistance: Supervision Gait Distance (Feet): 75 Feet Assistive device: 4-wheeled walker Gait Pattern/deviations: Step-through pattern;Decreased stride length;Trunk flexed Gait velocity: slow   General Gait Details: pt moves slowly and occasionally stops for rest break, denies SOB, etc, and does not demonstrate s/s leg weakness, though describes legs 'feeling like cement'  Stairs            Wheelchair Mobility    Modified Rankin (Stroke Patients Only)       Balance Overall balance assessment: No apparent balance deficits (not formally assessed)(benefits from assistive device but no hx falls)                                           Pertinent Vitals/Pain Pain Assessment: 0-10 Pain Score: 4  Pain Location: back spasms, better now Pain Descriptors / Indicators: Spasm Pain Intervention(s): Monitored during session;Patient requesting pain meds-RN notified;Limited activity within patient's tolerance    Home Living Family/patient expects to be discharged to:: Private residence Living Arrangements: Alone;Children;Other relatives Available Help at Discharge: Family;Available PRN/intermittently(son works 10-15 hours, taking class; dtr Armed forces training and education officer) Type of Home: House Home Access: Stairs to enter Entrance  Stairs-Rails: None(rails in disrepair, unsafe to use) Technical brewer of Steps: 5 Home Layout: One level Home Equipment: None      Prior Function Level of Independence: Independent         Comments: voiced concern about having to prepare meals at home alone "Its easy here, you can just order food"     Hand Dominance   Dominant Hand: Right    Extremity/Trunk  Assessment   Upper Extremity Assessment Upper Extremity Assessment: Overall WFL for tasks assessed    Lower Extremity Assessment Lower Extremity Assessment: Overall WFL for tasks assessed;Generalized weakness    Cervical / Trunk Assessment Cervical / Trunk Assessment: Other exceptions(obesity)  Communication   Communication: No difficulties  Cognition Arousal/Alertness: Awake/alert Behavior During Therapy: WFL for tasks assessed/performed Overall Cognitive Status: Within Functional Limits for tasks assessed                                        General Comments General comments (skin integrity, edema, etc.): VSS with HR in 80's throughout    Exercises     Assessment/Plan    PT Assessment Patient needs continued PT services  PT Problem List Pain;Obesity;Cardiopulmonary status limiting activity;Decreased knowledge of use of DME;Decreased mobility;Decreased activity tolerance;Decreased range of motion;Decreased strength       PT Treatment Interventions DME instruction;Gait training;Stair training;Functional mobility training;Therapeutic activities;Therapeutic exercise;Patient/family education    PT Goals (Current goals can be found in the Care Plan section)  Acute Rehab PT Goals Patient Stated Goal: go home PT Goal Formulation: With patient Time For Goal Achievement: 10/30/18 Potential to Achieve Goals: Good    Frequency Min 3X/week   Barriers to discharge Decreased caregiver support children work and cannot stay home with pt, pt is reluctant to discuss SNF or other postacute rehab considerations    Co-evaluation               AM-PAC PT "6 Clicks" Mobility  Outcome Measure Help needed turning from your back to your side while in a flat bed without using bedrails?: A Little Help needed moving from lying on your back to sitting on the side of a flat bed without using bedrails?: A Little Help needed moving to and from a bed to a chair (including a  wheelchair)?: None Help needed standing up from a chair using your arms (e.g., wheelchair or bedside chair)?: None Help needed to walk in hospital room?: A Little Help needed climbing 3-5 steps with a railing? : A Little 6 Click Score: 20    End of Session   Activity Tolerance: Patient tolerated treatment well;Patient limited by pain Patient left: in chair;with call bell/phone within reach Nurse Communication: Mobility status;Patient requests pain meds PT Visit Diagnosis: Difficulty in walking, not elsewhere classified (R26.2);Pain Pain - part of body: (back)    Time: 7628-3151 PT Time Calculation (min) (ACUTE ONLY): 44 min   Charges:   PT Evaluation $PT Eval Moderate Complexity: 1 Mod PT Treatments $Gait Training: 23-37 mins $Therapeutic Activity: 8-22 mins        Kearney Hard, PT, DPT, MS Board Certified Geriatric Clinical Specialist  Herbie Drape 10/16/2018, 1:28 PM

## 2018-10-16 NOTE — Evaluation (Signed)
Occupational Therapy Evaluation Patient Details Name: [name] MRN: [id number] DOB: Jan 06, 1966 Today's Date: 10/16/2018    History of Present Illness This 53 y.o. female transferred from [facility]. with chest pain.   On 7/21 she underwent DES to SVG-LAD with moderate success; Dx: very aggressive CAD, Acute on chronic diastolic CHF, venous insufficiency  with h/o venous stasis ulcers, AKI on CKD stage 3.  PMH includes: obesity, hyperlipidemia    Clinical Impression   Pt admitted with above. She demonstrates the below listed deficits and will benefit from continued OT to maximize safety and independence with BADLs.  Pt presents to OT with generalized weakness, mild balance deficits, decreased activity tolerance, and back pain.  She currently requires supervision - min A for ADLs.  She did agree to walk short distance during eval.  PTA, she lived with son and daughter, who can assist her at discharge.  She will likely benefit from adaptive equipment to increase ease of LB ADL and conserve energy.  She is not interested in SNF level rehab.  Will follow acutely.       Follow Up Recommendations  Home health OT;Supervision/Assistance - 24 hour;Other (comment)(HHaide )    Equipment Recommendations  3 in 1 bedside commode;Tub/shower seat    Recommendations for Other Services       Precautions / Restrictions Precautions Precautions: Fall Precaution Comments: use device for support Restrictions Weight Bearing Restrictions: No      Mobility Bed Mobility               General bed mobility comments: Pt up in room   Transfers Overall transfer level: Modified independent Equipment used: None                  Balance Overall balance assessment: Mild deficits observed, not formally tested                                         ADL either performed or assessed with clinical judgement   ADL Overall ADL's : Needs assistance/impaired Eating/Feeding:  Independent   Grooming: Wash/dry hands;Wash/dry face;Oral care;Brushing hair;Supervision/safety;Standing   Upper Body Bathing: Set up;Supervision/ safety;Sitting   Lower Body Bathing: Minimal assistance;Sit to/from stand   Upper Body Dressing : Set up;Sitting   Lower Body Dressing: Minimal assistance;Sit to/from stand Lower Body Dressing Details (indicate cue type and reason): Pt typically does not wear socks and instead wears slid on sandals or goes barefoot  Toilet Transfer: Supervision/safety;Ambulation;BSC;Min guard           Functional mobility during ADLs: Min guard General ADL Comments: Pt up standing at sink upon OT's arrival.       Vision         Perception     Praxis      Pertinent Vitals/Pain Pain Assessment: Faces Faces Pain Scale: Hurts little more Pain Location: back spasms, better now Pain Descriptors / Indicators: Spasm Pain Intervention(s): Monitored during session     Hand Dominance Right   Extremity/Trunk Assessment Upper Extremity Assessment Upper Extremity Assessment: Generalized weakness   Lower Extremity Assessment Lower Extremity Assessment: Generalized weakness   Cervical / Trunk Assessment Cervical / Trunk Assessment: Other exceptions(obesity)   Communication Communication Communication: No difficulties   Cognition Arousal/Alertness: Awake/alert Behavior During Therapy: WFL for tasks assessed/performed Overall Cognitive Status: Within Functional Limits for tasks assessed  General Comments  VSS.  Pt able to tolerate standing x ~7 mins which is close to functional standing tolerance.  Discussed use of AE to assist with LB ADLs     Exercises     Shoulder Instructions      Home Living Family/patient expects to be discharged to:: Private residence Living Arrangements: Alone;Children;Other relatives Available Help at Discharge: Family;Available PRN/intermittently(son works 10-15  hours, taking class; dtr Armed forces training and education officer) Type of Home: House Home Access: Stairs to enter Technical brewer of Steps: 5 Entrance Stairs-Rails: None(rails in disrepair, unsafe to use) Home Layout: One level     Bathroom Shower/Tub: Tub/shower unit;Door   [employer] Toilet: Standard     Home Equipment: None          Prior Functioning/Environment Level of Independence: Independent                 OT Problem List: Decreased strength;Decreased activity tolerance;Impaired balance (sitting and/or standing);Decreased knowledge of use of DME or AE;Cardiopulmonary status limiting activity;Obesity;Pain      OT Treatment/Interventions: Self-care/ADL training;Therapeutic exercise;DME and/or AE instruction;Therapeutic activities;Patient/family education;Balance training;Energy conservation    OT Goals(Current goals can be found in the care plan section) Acute Rehab OT Goals Patient Stated Goal: go home OT Goal Formulation: With patient Time For Goal Achievement: 10/30/18 Potential to Achieve Goals: Good ADL Goals Pt Will Perform Grooming: with modified independence;standing Pt Will Perform Upper Body Bathing: with modified independence;standing;sitting Pt Will Perform Lower Body Bathing: with modified independence;sit to/from stand;with adaptive equipment Pt Will Perform Upper Body Dressing: with modified independence;sitting Pt Will Perform Lower Body Dressing: with modified independence;with adaptive equipment;sit to/from stand Pt Will Transfer to Toilet: with modified independence;ambulating;bedside commode;grab bars;regular height toilet Pt Will Perform Toileting - Clothing Manipulation and hygiene: with modified independence;sit to/from stand Pt Will Perform Tub/Shower Transfer: Tub transfer;with min guard assist;ambulating;shower seat Pt/caregiver will Perform Home Exercise Program: Increased strength;Both right and left upper extremity;With written HEP provided;With  theraband Additional ADL Goal #1: Pt will be independent with energy conservation techniques  OT Frequency: Min 2X/week   Barriers to D/C:            Co-evaluation              AM-PAC OT "6 Clicks" Daily Activity     Outcome Measure Help from another person eating meals?: None Help from another person taking care of personal grooming?: A Little Help from another person toileting, which includes using toliet, bedpan, or urinal?: A Little Help from another person bathing (including washing, rinsing, drying)?: A Little Help from another person to put on and taking off regular upper body clothing?: A Little Help from another person to put on and taking off regular lower body clothing?: A Little 6 Click Score: 19   End of Session Nurse Communication: Mobility status  Activity Tolerance: Patient limited by fatigue Patient left: in chair;with call bell/phone within reach  OT Visit Diagnosis: Muscle weakness (generalized) (M62.81)                Time: 1640-1700 OT Time Calculation (min): 20 min Charges:  OT General Charges $OT Visit: 1 Visit OT Evaluation $OT Eval Moderate Complexity: 1 Mod  Lucille Passy, OTR/L Acute Rehabilitation Services Pager [phone] Office [phone]   Lucille Passy M 10/16/2018, 5:24 PM

## 2018-10-16 NOTE — Progress Notes (Signed)
Patient ID: [name], female   DOB: 09/01/1965, 53 y.o.   MRN: [id number]     Primary Cardiologist :  Aundra Dubin   Subjective:    On 7/21 Underwent PCI with DES to SVG - LAD with moderate success due to non-expandable lesion. Still with 45% residual stenosis. LCx felt to be too small to intervene on. Total contrast 55cc   She has been short of breath with exertion, was given IV Lasix then transitioned to torsemide 40 bid yesterday.  Weight is up and down, does not appear accurate.  CVP 13-14 today.   Would not walk with PT yesterday due to back pain.    Objective:   Weight Range: (!) 156.4 kg Body mass index is 54 kg/m.   Vital Signs:   Temp:  [98 F (36.7 C)-98.3 F (36.8 C)] 98.1 F (36.7 C) (07/26 0521) Pulse Rate:  [79-83] 79 (07/26 0521) Resp:  [18-20] 18 (07/26 0521) BP: (117-128)/(58-75) 128/65 (07/26 0521) SpO2:  [100 %] 100 % (07/26 0521) Weight:  [156.4 kg] 156.4 kg (07/26 0500) Last BM Date: 10/13/18  Weight change: Filed Weights   10/14/18 0500 10/15/18 0417 10/16/18 0500  Weight: (!) 155.2 kg (!) 154.9 kg (!) 156.4 kg    Intake/Output:   Intake/Output Summary (Last 24 hours) at 10/16/2018 0914 Last data filed at 10/16/2018 0200 Gross per 24 hour  Intake 480 ml  Output 2750 ml  Net -2270 ml      Physical Exam    General: NAD Neck: JVP 10-12 cm, no thyromegaly or thyroid nodule.  Lungs: Clear to auscultation bilaterally with normal respiratory effort. CV: Nondisplaced PMI.  Heart regular S1/S2, no S3/S4, no murmur.  1+ edema to knees, lower legs wrapped.   Abdomen: Soft, nontender, no hepatosplenomegaly, no distention.  Skin: Intact without lesions or rashes.  Neurologic: Alert and oriented x 3.  Psych: Normal affect. Extremities: No clubbing or cyanosis.  HEENT: Hirsute.    Telemetry   NSR 80s (personally reviewed).   Labs    CBC No results for input(s): WBC, NEUTROABS, HGB, HCT, MCV, PLT in the last 72 hours. Basic Metabolic Panel  Recent Labs    10/15/18 0455 10/16/18 0444  NA 138 137  K 4.4 4.1  CL 107 104  CO2 23 25  GLUCOSE 148* 185*  BUN 39* 42*  CREATININE 2.44* 2.45*  CALCIUM 9.2 9.1    BNP: BNP (last 3 results) Recent Labs    10/01/18 2330  BNP 203.9*     Other results:  Cath 10/07/18 1. Normal left-sided filling pressures (PCWP and LVEDP) but elevated right-sided pressures suggesting primary RV failure. PAPI is adequate, good cardiac output, pulmonary venous hypertension.  2. Occluded SVG-RCA and native RCA with faint collaterals.  There does not appear to be a good option for intervention here.  3. 95% mid LCx stenosis.  4. Totally occluded mid LAD with 80% in-stent restenosis proximally in the SVG-LAD.  5. 35 cc contrast used (CKD).    Medications:     Scheduled Medications: . amLODipine  2.5 mg Oral Daily  . aspirin EC  81 mg Oral Daily  . carvedilol  25 mg Oral BID WC  . citalopram  20 mg Oral Daily  . citalopram  30 mg Oral QHS  . febuxostat  80 mg Oral Daily  . fenofibrate  160 mg Oral Daily  . gabapentin  100 mg Oral 2 times per day  . gabapentin  200 mg Oral QHS  . heparin  5,000 Units Subcutaneous Q8H  . insulin aspart  0-15 Units Subcutaneous TID WC  . insulin aspart  12 Units Subcutaneous TID WC  . insulin glargine  50 Units Subcutaneous BID  . isosorbide mononitrate  120 mg Oral Daily  . LORazepam  0.5 mg Oral Q8H  . omega-3 acid ethyl esters  2 g Oral BID  . pantoprazole  20 mg Oral Daily  . pneumococcal 23 valent vaccine  0.5 mL Intramuscular Tomorrow-1000  . prasugrel  10 mg Oral q morning - 10a  . ranolazine  1,000 mg Oral BID  . rosuvastatin  5 mg Oral Daily  . sodium chloride flush  10-40 mL Intracatheter Q12H  . torsemide  60 mg Oral BID    Infusions: . sodium chloride    . sodium chloride      PRN Medications: sodium chloride, sodium chloride, acetaminophen, alum & mag hydroxide-simeth, docusate sodium, methocarbamol, nitroGLYCERIN, ondansetron  (ZOFRAN) IV, oxyCODONE, sodium chloride flush, sodium chloride flush   Assessment/Plan   1. CAD: Very aggressive coronary disease. Had 4 more drug-eluting stents placed in 12/11, 1 in the SVG-LAD and 3 to open the totally occluded native RCA after occlusion of the SVG-PDA. She was deemed not to be a candidate for re-do CABG at that admission by cardiac surgery. Unfortunately, due to her body habitus, the surgeons were unable to mobilize her LIMA for grafting during her initial CABG operation. Lexiscan Cardiolite in 3/14 showed no ischemia or infarction though study was limited by body habitus.She has continued to have chest pain episodes, including the episode that brought her to the hospital on 7/11.  HsTnI was mildly elevated with no trend, seemed more consistent with demand ischemia from volume overload.  Her Cardiolite showed a moderate area of inferior ischemia.  I have tried to manage her very aggressively with anti-anginal meds given her CKD.  However, she still had significant angina (9/10 chest pain) with a short walk in the hall. Cath was done, showing occluded SVG-RCA and native RCA with faint collaterals, 95% mid LCx stenosis probably too small for intervention, and totally occluded mid LAD with 80% in-stent restenosis proximally in the SVG-LAD. On 7/21 underwent DES to SVG-LAD with moderate success due to non-expandable lesion. Still with 45% residual stenosis.  She is not having chest pain now, just short of breath with exertion.  - Continue ASA, statin, Vascepa.  - Continue Effient long-term since she is a poor Plavix responder and has multiple DES.   -Continue ranolazine 1000 mg bid.   - Continue Imdur 120 mg daily.  - Continue Coreg 25 mg bid.  - Continue amlodipine 2.5 mg daily.   2. Acute on chronic diastolic CHF: NYHA class IIIsymptoms at home. Echo this admission with EF 55-60%, normal RV.Cardiolite with EF 40%, but I reviewed echo and think that the Cardiolite is  inaccurate, EF looks like it is in the 60% range. She diuresed some initially with IV Lasix but creatinine up to 3.11.  Creatinine has been up and down since, tends to worsen with aggressive diuresis.  Still with CVP 13-14 today, on torsemide 40 mg bid.  Will try to gently diurese over time as creatinine tolerates.  - I will increase torsemide to 60 mg bid today.  Follow creatinine closely.  3. Hyperlipidemia: Good LDL and TGs this admission.   - Continue Vascepa.  - Continue Crestor.  4. Obesity: She needs to continue weight loss efforts. 5.Venous insufficiency: ABIs normal in 3/19. She has history of  venous stasis ulcerations that continue to be present. No DVT on left on venous dopplers.  - Wound consult has seen, recommend unna boots.  6. AKI on CKD stage 3. Baseline creatinine around 2.3, up to 3.1 with diuresis and now 2.44 => 2.45.  CVP 13-14 this morning.  - Stay off enalapril.  - Plan to increase torsemide to 60 mg bid but will have to follow closely.  7. Deconditioning: Needs to get out of bed.  ?Need SNF stay.  Hopefully can have disposition for her in the next couple days.    Length of Stay: 15  Loralie Champagne, MD  10/16/2018, 9:14 AM

## 2018-10-17 LAB — BASIC METABOLIC PANEL
Anion gap: 10 (ref 5–15)
BUN: 46 mg/dL — ABNORMAL HIGH (ref 6–20)
CO2: 25 mmol/L (ref 22–32)
Calcium: 9 mg/dL (ref 8.9–10.3)
Chloride: 102 mmol/L (ref 98–111)
Creatinine, Ser: 2.52 mg/dL — ABNORMAL HIGH (ref 0.44–1.00)
GFR calc Af Amer: 24 mL/min — ABNORMAL LOW (ref >60)
GFR calc non Af Amer: 21 mL/min — ABNORMAL LOW (ref >60)
Glucose, Bld: 261 mg/dL — ABNORMAL HIGH (ref 70–99)
Potassium: 3.8 mmol/L (ref 3.5–5.1)
Sodium: 137 mmol/L (ref 135–145)

## 2018-10-17 LAB — GLUCOSE, CAPILLARY
Glucose-Capillary: 221 mg/dL — ABNORMAL HIGH (ref 70–99)
Glucose-Capillary: 156 mg/dL — ABNORMAL HIGH (ref 70–99)

## 2018-10-17 MED ORDER — PANTOPRAZOLE SODIUM 40 MG PO TBEC: 40.0000 mg | DELAYED_RELEASE_TABLET | Freq: Every day | ORAL | 6 refills | Status: DC

## 2018-10-17 MED ORDER — FEBUXOSTAT 40 MG PO TABS: 80.0000 mg | ORAL_TABLET | Freq: Every day | ORAL | 6 refills | Status: DC

## 2018-10-17 MED ORDER — TORSEMIDE 20 MG PO TABS: ORAL_TABLET | ORAL | 5 refills | Status: DC

## 2018-10-17 MED ORDER — ISOSORBIDE MONONITRATE ER 120 MG PO TB24: 120.0000 mg | ORAL_TABLET | Freq: Every day | ORAL | 6 refills | Status: DC

## 2018-10-17 MED ORDER — VASCEPA 1 G PO CAPS: 2.0000 g | ORAL_CAPSULE | Freq: Two times a day (BID) | ORAL | 6 refills | Status: DC

## 2018-10-17 MED ORDER — RANOLAZINE ER 1000 MG PO TB12: 1000.0000 mg | ORAL_TABLET | Freq: Two times a day (BID) | ORAL | 6 refills | Status: DC

## 2018-10-17 MED ORDER — AMLODIPINE BESYLATE 2.5 MG PO TABS: 2.5000 mg | ORAL_TABLET | Freq: Every day | ORAL | 6 refills | Status: DC

## 2018-10-17 NOTE — TOC Transition Note (Signed)
Transition of Care [facility]) - CM/SW Discharge Note   Patient Details  Name: [name] MRN: [id number] Date of Birth: [birth year]  [facility]) CM/SW Contact:  Bethena Roys, RN Phone Number: 10/17/2018, 4:36 PM   Clinical Narrative:  Staff RN attempted to  Remove unna boots and revealed a 1 cm wound to lower extremity. Patient now wants Outpatient Wound Care- Appointment was scheduled at the [facility]- patient is agreeable to 1300 on Oct 24, 2018. Patient states she can get transportation. No further needs from CM at this time.   Final next level of care: Home/Self Care Barriers to Discharge: No Barriers Identified   Patient Goals and CMS Choice Patient states their goals for this hospitalization and ongoing recovery are:: "to get home" CMS Medicare.gov Compare Post Acute Care list provided to:: Patient Choice offered to / list presented to : Patient  Discharge Placement                       Discharge Plan and Services In-house Referral: NA Discharge Planning Services: CM Consult Post Acute Care Choice: Home Health          DME Arranged: Walker rolling with seat, Shower stool DME Agency: [employer] Date DME Agency Contacted: 10/17/18 Time DME Agency Contacted: 25 Representative spoke with at DME Agency: [place]: RN, PT(unable to staff the patient-some agencies would not accept the insurance not in [place], staffing issues and not able to take [facility].)          Social Determinants of Health (SDOH) Interventions     Readmission Risk Interventions No flowsheet data found.

## 2018-10-17 NOTE — Care Management Important Message (Signed)
Important Message  Patient Details  Name: [name] MRN: [id number] Date of Birth: 03-Sep-1965   Medicare Important Message Given:  Yes     Shelda Altes 10/17/2018, 1:35 PM

## 2018-10-17 NOTE — Progress Notes (Signed)
Patient ID: [name], female   DOB: [birth year], 53 y.o.   MRN: [id number]     Primary Cardiologist :  Aundra Dubin   Subjective:    On 7/21 Underwent PCI with DES to SVG - LAD with moderate success due to non-expandable lesion. Still with 45% residual stenosis. LCx felt to be too small to intervene on. Total contrast 55cc   She had torsemide 60 mg bid yesterday.  Creatinine up slightly to 2.52.  CVP 12-13 today.   PT recommending SNF for rehab but she refuses, says son will be with her at home.     Objective:   Weight Range: (!) 156.7 kg Body mass index is 54.11 kg/m.   Vital Signs:   Temp:  [98.3 F (36.8 C)-98.7 F (37.1 C)] 98.3 F (36.8 C) (07/27 1191) Pulse Rate:  [85-92] 86 (07/27 0633) Resp:  [18] 18 (07/27 0633) BP: (114-141)/(56-65) 141/65 (07/27 0633) SpO2:  [99 %-100 %] 100 % (07/27 0633) Weight:  [156.7 kg] 156.7 kg (07/27 0633) Last BM Date: 10/13/18  Weight change: Filed Weights   10/15/18 0417 10/16/18 0500 10/17/18 0633  Weight: (!) 154.9 kg (!) 156.4 kg (!) 156.7 kg    Intake/Output:   Intake/Output Summary (Last 24 hours) at 10/17/2018 0730 Last data filed at 10/17/2018 0700 Gross per 24 hour  Intake 460 ml  Output 3350 ml  Net -2890 ml      Physical Exam    General: NAD Neck: JVP 10-11, no thyromegaly or thyroid nodule.  Lungs: Clear to auscultation bilaterally with normal respiratory effort. CV: Nonpalpable PMI.  Heart regular S1/S2, no S3/S4, no murmur.  1+ edema 1/2 to knees bilaterally.   Abdomen: Soft, nontender, no hepatosplenomegaly, no distention.  Skin: Intact without lesions or rashes.  Neurologic: Alert and oriented x 3.  Psych: Normal affect. Extremities: No clubbing or cyanosis.  HEENT: Hirsute   Telemetry   NSR 80s, RBBB (personally reviewed).   Labs    CBC No results for input(s): WBC, NEUTROABS, HGB, HCT, MCV, PLT in the last 72 hours. Basic Metabolic Panel Recent Labs    10/16/18 0444 10/17/18 0406  NA 137 137   K 4.1 3.8  CL 104 102  CO2 25 25  GLUCOSE 185* 261*  BUN 42* 46*  CREATININE 2.45* 2.52*  CALCIUM 9.1 9.0    BNP: BNP (last 3 results) Recent Labs    10/01/18 2330  BNP 203.9*     Other results:  Cath 10/07/18 1. Normal left-sided filling pressures (PCWP and LVEDP) but elevated right-sided pressures suggesting primary RV failure. PAPI is adequate, good cardiac output, pulmonary venous hypertension.  2. Occluded SVG-RCA and native RCA with faint collaterals.  There does not appear to be a good option for intervention here.  3. 95% mid LCx stenosis.  4. Totally occluded mid LAD with 80% in-stent restenosis proximally in the SVG-LAD.  5. 35 cc contrast used (CKD).    Medications:     Scheduled Medications:  amLODipine  2.5 mg Oral Daily   aspirin EC  81 mg Oral Daily   carvedilol  25 mg Oral BID WC   citalopram  20 mg Oral Daily   citalopram  30 mg Oral QHS   febuxostat  80 mg Oral Daily   fenofibrate  160 mg Oral Daily   gabapentin  100 mg Oral 2 times per day   gabapentin  200 mg Oral QHS   heparin  5,000 Units Subcutaneous Q8H   insulin aspart  0-15 Units Subcutaneous TID WC   insulin aspart  12 Units Subcutaneous TID WC   insulin glargine  50 Units Subcutaneous BID   isosorbide mononitrate  120 mg Oral Daily   LORazepam  0.5 mg Oral Q8H   omega-3 acid ethyl esters  2 g Oral BID   pantoprazole  20 mg Oral Daily   pneumococcal 23 valent vaccine  0.5 mL Intramuscular Tomorrow-1000   prasugrel  10 mg Oral q morning - 10a   ranolazine  1,000 mg Oral BID   rosuvastatin  5 mg Oral Daily   sodium chloride flush  10-40 mL Intracatheter Q12H   torsemide  60 mg Oral BID    Infusions:  sodium chloride     sodium chloride      PRN Medications: sodium chloride, sodium chloride, acetaminophen, alum & mag hydroxide-simeth, docusate sodium, methocarbamol, nitroGLYCERIN, ondansetron (ZOFRAN) IV, oxyCODONE, sodium chloride flush, sodium chloride  flush   Assessment/Plan   1. CAD: Very aggressive coronary disease. Had 4 more drug-eluting stents placed in 12/11, 1 in the SVG-LAD and 3 to open the totally occluded native RCA after occlusion of the SVG-PDA. She was deemed not to be a candidate for re-do CABG at that admission by cardiac surgery. Unfortunately, due to her body habitus, the surgeons were unable to mobilize her LIMA for grafting during her initial CABG operation. Lexiscan Cardiolite in 3/14 showed no ischemia or infarction though study was limited by body habitus.She has continued to have chest pain episodes, including the episode that brought her to the hospital on 7/11.  HsTnI was mildly elevated with no trend, seemed more consistent with demand ischemia from volume overload.  Her Cardiolite showed a moderate area of inferior ischemia.  I have tried to manage her very aggressively with anti-anginal meds given her CKD.  However, she still had significant angina (9/10 chest pain) with a short walk in the hall. Cath was done, showing occluded SVG-RCA and native RCA with faint collaterals, 95% mid LCx stenosis probably too small for intervention, and totally occluded mid LAD with 80% in-stent restenosis proximally in the SVG-LAD. On 7/21 underwent DES to SVG-LAD with moderate success due to non-expandable lesion. Still with 45% residual stenosis.  She is not having chest pain now, just short of breath with exertion.  - Continue ASA, statin, Vascepa.  - Continue Effient long-term since she is a poor Plavix responder and has multiple DES.   -Continue ranolazine 1000 mg bid.   - Continue Imdur 120 mg daily.  - Continue Coreg 25 mg bid.  - Continue amlodipine 2.5 mg daily.   2. Acute on chronic diastolic CHF: NYHA class IIIsymptoms at home. Echo this admission with EF 55-60%, normal RV.Cardiolite with EF 40%, but I reviewed echo and think that the Cardiolite is inaccurate, EF looks like it is in the 60% range. She diuresed some  initially with IV Lasix but creatinine up to 3.11.  Creatinine has been up and down since, tends to worsen with aggressive diuresis.  Still with CVP 12-13 today, on torsemide 60 mg bid.  Good UOP yesterday on current regimen, still drinking too much fluid.  She still has some volume overload, but has been very hard to lower her CVP to normal range without worsening renal function.  Will aim for slow diuresis over time with po diuretic.  - She will go home on torsemide 60 qam/40 qpm.  - Long discussion about fluid and sodium restriction.  3. Hyperlipidemia: Good LDL and TGs  this admission.   - Continue Vascepa.  - Continue Crestor.  4. Obesity: She needs to continue weight loss efforts. 5.Venous insufficiency: ABIs normal in 3/19. She has history of venous stasis ulcerations that continue to be present. No DVT on left on venous dopplers.  - Wound consult has seen, recommend unna boots.  6. AKI on CKD stage 3. Baseline creatinine around 2.3, up to 3.1 with diuresis and now 2.44 => 2.45 => 2.52.  CVP 12-13 this morning.  - Stay off enalapril.  - Lower torsemide slightly to 60 qam/40 qpm for home.   7. Disposition: PT recommend SNF, she refuses.  Her son will be at home with her.  Will need home health/PT.  At this point, she is ready to leave acute care hospital. Needs to keep fluid restricted to < 2L/day and sodium <1500 mg/day.  I suspect that this is going to be hard for her.  She needs a BMET drawn in 1 week and followup in office in 10-14 days.  Cardiac meds for home: torsemide 60 qam/40 qpm, amlodipine 2.5 daily, ASA 81 daily, prasugrel 10 daily, fenofibrate 160 daily, Coreg 25 mg bid, ranolazine 1000 bid,  Imdur 120 mg daily, Crestor 5 daily, KCl 20 daily, Vascepa 2 g bid.    Length of Stay: Nettie, MD  10/17/2018, 7:30 AM

## 2018-10-17 NOTE — TOC Initial Note (Signed)
Transition of Care [facility]) - Initial/Assessment Note    Patient Details  Name: Glendoris Nodarse MRN: [id number] Date of Birth: 09/24/1965  Transition of Care [facility]) CM/SW Contact:    Bethena Roys, RN Phone Number: 10/17/2018, 3:49 PM  Clinical Narrative:  Pt presented for Chest Pain- PTA from home alone, however has support of nieces and cousins. CM diligently looked for home health agencies that would accept the patient. Medicare.gov list provided to patient and she chose [facility]- central intake office notified and they declined stating decrease staffing availability. CM called several agencies that were reflected on the Medicare.gov list and no one could service the patient with the insurance. CM did call Director Rhonda Rumple for suggestions. CM was able to get a Rx for outpatient PT Services. Patient wants to use [facility] in [place] called, however office closed at 1300. Patient gave verbal permission for CM to provide the office with information. Patient with unna boots- CM did call nurse practitioner and she stated to place ted hose on patient, however patient declined Ted hose- NP stated to staff RN to take unna boots off, no ted hose for home and they will follow up post hospital follow up appointment. Patient is agreeable to plan of care at this time. Patient has transportation home. DME Rollator and Shower stool has been delivered to room. No further needs from CM at this time.             Expected Discharge Plan: Home/Self Care Barriers to Discharge: No Barriers Identified   Patient Goals and CMS Choice Patient states their goals for this hospitalization and ongoing recovery are:: "to get home" CMS Medicare.gov Compare Post Acute Care list provided to:: Patient Choice offered to / list presented to : Patient  Expected Discharge Plan and Services Expected Discharge Plan: Home/Self Care In-house Referral: NA Discharge Planning  Services: CM Consult Post Acute Care Choice: [place] arrangements for the past 2 months: Single Family Home Expected Discharge Date: 10/17/18               DME Arranged: Gilford Rile rolling with seat, Shower stool DME Agency: [employer] Date DME Agency Contacted: 10/17/18 Time DME Agency Contacted: 51 Representative spoke with at DME Agency: [place] Arranged: RN, PT(unable to staff the patient-some agencies would not accept the insurance not in [place], staffing issues and not able to take [facility].)          Prior Living Arrangements/Services Living arrangements for the past 2 months: [place] with:: Self(Has support of family.) Patient language and need for interpreter reviewed:: Yes Do you feel safe going back to the place where you live?: Yes      Need for Family Participation in Patient Care: Yes (Comment) Care giver support system in place?: Yes (comment)   Criminal Activity/Legal Involvement Pertinent to Current Situation/Hospitalization: No - Comment as needed  Activities of Daily Living Home Assistive Devices/Equipment: None ADL Screening (condition at time of admission) Patient's cognitive ability adequate to safely complete daily activities?: Yes Is the patient deaf or have difficulty hearing?: No Does the patient have difficulty seeing, even when wearing glasses/contacts?: No Does the patient have difficulty concentrating, remembering, or making decisions?: No Patient able to express need for assistance with ADLs?: Yes Does the patient have difficulty dressing or bathing?: No Independently performs ADLs?: Yes (appropriate for developmental age) Does the patient have difficulty walking or climbing stairs?: Yes Weakness of Legs: None Weakness of  Arms/Hands: None  Permission Sought/Granted Permission sought to share information with : Family Supports, Investment banker, corporate granted to share info w AGENCY: [employer]         Emotional Assessment Appearance:: Appears stated age Attitude/Demeanor/Rapport: Engaged Affect (typically observed): Accepting Orientation: : Oriented to  Time, Oriented to Situation, Oriented to Place, Oriented to Self Alcohol / Substance Use: Not Applicable Psych Involvement: No (comment)  Admission diagnosis:  ANGINA PECTORIS Patient Active Problem List   Diagnosis Date Noted  . Chest pain 10/01/2018  . Obesity 09/12/2011  . OSA (obstructive sleep apnea) 09/12/2011  . CHEST PAIN 02/04/2010  . PALPITATIONS 07/22/2009  . Hyperlipidemia 05/07/2009  . HYPERTENSION, UNSPECIFIED 05/07/2009  . Atherosclerosis of coronary artery bypass graft 05/07/2009  . CHRONIC DIASTOLIC HEART FAILURE 35/59/7416   PCP:  Coy Saunas, MD Pharmacy:   [facility], [address], [place] [address] Phone: [phone] Fax: [phone]     Social Determinants of Health (SDOH) Interventions    Readmission Risk Interventions No flowsheet data found.

## 2018-10-17 NOTE — Discharge Summary (Signed)
Advanced Heart Failure Team  Discharge Summary   Patient ID: [name] MRN: [id number], DOB/AGE: December 18, 1965 53 y.o. Admit date: 10/01/2018 D/C date:     10/17/2018   Primary Discharge Diagnoses:  1. CAD 2. A/C Diastolic Heart Failure  3. Hyperlipidemia  4. Obesity  5. Venous Insuffieciency 6. AKI on CKD Stage III  Hospital Course:   [name] with a history of CAD s/p CABG (2010) and multiple PCI, HFpEF, DM2, CKD, HTN, HLD, morbid obesity, who presents as a transfer from [facility] with chest pain.  HS Troponin remained flat. Initially on NTG drip but later stopped. Underwent cath with DES placed to SVD-LAD moderate success. Imdur was increased. Diuresed with IV lasix and transitioned to torsmide 60 mg/40 mg. Hospital course complicated by AKI. See below for detailed problem list. She will continue to be followed closely in the HF clinic. Referred to [facility] for PT/RN.   1. CAD: Very aggressive coronary disease. Had 4 more drug-eluting stents placed in 12/11, 1 in the SVG-LAD and 3 to open the totally occluded native RCA after occlusion of the SVG-PDA. She was deemed not to be a candidate for re-do CABG at that admission by cardiac surgery. Unfortunately, due to her body habitus, the surgeons were unable to mobilize her LIMA for grafting during her initial CABG operation. Lexiscan Cardiolite in 3/14 showed no ischemia or infarction though study was limited by body habitus.She has continued to have chest pain episodes, including the episode that brought her to the hospital on 7/11. HsTnI was mildly elevated with no trend, seemed more consistent with demand ischemia from volume overload. Her Cardiolite showed a moderate area of inferior ischemia. I have tried to manage her very aggressively with anti-anginal meds given her CKD. However, she still had significant angina (9/10 chest pain) with a short walk in the hall. Cath was done, showing occluded SVG-RCA and native RCA with faint  collaterals, 95% mid LCx stenosis probably too small for intervention, and totally occluded mid LAD with 80% in-stent restenosis proximally in the SVG-LAD. On 7/21 underwent DES to SVG-LAD with moderate success due to non-expandable lesion. Still with 45% residual stenosis.  She is not having chest pain now, just short of breath with exertion.  - Continue ASA, statin, Vascepa.  - Continue Effient long-term since she is a poor Plavix responder and has multiple DES.   -Continue ranolazine 1000 mg bid.  - Continue Imdur 120 mg daily.  - Continue Coreg 25 mg bid.  -Continue amlodipine 2.5 mg daily. 2. Acute on chronic diastolic CHF: NYHA class IIIsymptoms at home. Echo this admission with EF 55-60%, normal RV.Cardiolite with EF 40%, but I reviewed echo and think that the Cardiolite is inaccurate, EF looks like it is in the 60% range. She diuresed some initially with IV Lasix but creatinine up to 3.11. Creatinine has been up and down since, tends to worsen with aggressive diuresis.  Still with CVP 12-13 today, on torsemide 60 mg bid.  Good UOP yesterday on current regimen, still drinking too much fluid.  She still has some volume overload, but has been very hard to lower her CVP to normal range without worsening renal function.   She will go home on torsemide 60 qam/40 qpm.  - Long discussion about fluid and sodium restriction.  3. Hyperlipidemia: Good LDL and TGs this admission.  - Continue Vascepa.  - Continue Crestor.  4. Obesity: Body mass index is 54.11 kg/m. She needs to continue weight loss efforts. 5.Venous  insufficiency: ABIs normal in 3/19. She has history of venous stasis ulcerations that continue to be present. No DVT on left on venous dopplers.  - Wound consult has seen, recommend unna boots.  6. AKI on CKD stage 3. Baseline creatinine around 2.3, up to 3.1 with diuresis and now 2.44 => 2.45 => 2.52. CVP 12-13this morning.  - Stay off enalapril.  - Lower torsemide slightly  to 60 qam/40 qpm for home.     Disposition: PT recommend SNF, she refuses.  Her son will be at home with her.  Will need home health/PT/RN.  At this point, she is ready to leave acute care hospital. Needs to keep fluid restricted to < 2L/day and sodium <1500 mg/day.  I suspect that this is going to be hard for her.  She needs a BMET drawn in 1 week and followup in office in 10-14 days.  Cardiac meds for home: torsemide 60 qam/40 qpm, amlodipine 2.5 daily, ASA 81 daily, prasugrel 10 daily, fenofibrate 160 daily, Coreg 25 mg bid, ranolazine 1000 bid,  Imdur 120 mg daily, Crestor 5 daily, KCl 20 daily, Vascepa 2 g bid.    Discharge Weight : 345 pounds  Discharge Vitals: Blood pressure (!) 141/65, pulse 86, temperature 98.3 F (36.8 C), temperature source Oral, resp. rate 18, height 5\' 7"  (1.702 m), weight (!) 156.7 kg, SpO2 100 %.  Labs: Lab Results  Component Value Date   WBC 6.7 10/12/2018   HGB 8.4 (L) 10/12/2018   HCT 26.1 (L) 10/12/2018   MCV 90.0 10/12/2018   PLT 188 10/12/2018    Recent Labs  Lab 10/17/18 0406  NA 137  K 3.8  CL 102  CO2 25  BUN 46*  CREATININE 2.52*  CALCIUM 9.0  GLUCOSE 261*   Lab Results  Component Value Date   CHOL 116 10/02/2018   HDL 35 (L) 10/02/2018   LDLCALC 65 10/02/2018   TRIG 82 10/02/2018   BNP (last 3 results) Recent Labs    10/01/18 2330  BNP 203.9*    ProBNP (last 3 results) No results for input(s): PROBNP in the last 8760 hours.   Diagnostic Studies/Procedures   No results found.  Discharge Medications   Allergies as of 10/17/2018      Reactions   Diphenhydramine-acetaminophen Hives   Diazepam Hives   Valium   Latex Itching   Simvastatin Other (See Comments)   Sore Joints   Tylenol Pm Extra [diphenhydramine-apap (sleep)] Hives   Ciprofloxacin Anxiety   Levofloxacin Anxiety      Medication List    STOP taking these medications   enalapril 5 MG tablet Commonly known as: VASOTEC     TAKE these medications    amLODipine 2.5 MG tablet Commonly known as: NORVASC Take 1 tablet (2.5 mg total) by mouth daily.   Aspirin 81 81 MG EC tablet Generic drug: aspirin Take 81 mg by mouth daily.   Bayer Contour Test test strip Generic drug: glucose blood   carvedilol 25 MG tablet Commonly known as: COREG TAKE ONE TABLET BY MOUTH TWICE DAILY FOR BLOOD PRESSURE AND HEART What changed: See the new instructions.   citalopram 20 MG tablet Commonly known as: CELEXA Take 20 mg by mouth 2 (two) times a day. 1 tab in the AM and 1 1/2 tab in the pm   Coenzyme Q10 200 MG Tabs Take 200 mg by mouth daily.   Effient 10 MG Tabs tablet Generic drug: prasugrel TAKE ONE TABLET BY MOUTH EVERY MORNING What  changed:   how much to take  when to take this   febuxostat 40 MG tablet Commonly known as: ULORIC Take 2 tablets (80 mg total) by mouth daily. What changed: how much to take   fenofibrate 160 MG tablet Take 160 mg by mouth daily.   gabapentin 100 MG capsule Commonly known as: NEURONTIN Take 100 mg by mouth See admin instructions. 100 mg  tab in the AM and noon, 200 mg  at bedtime   Icosapent Ethyl 1 g Caps Commonly known as: Vascepa Take 2 capsules (2 g total) by mouth 2 (two) times daily. What changed: when to take this   Vascepa 1 g Caps Generic drug: Icosapent Ethyl Take 2 capsules (2 g total) by mouth 2 (two) times a day. What changed: You were already taking a medication with the same name, and this prescription was added. Make sure you understand how and when to take each.   insulin aspart 100 UNIT/ML injection Commonly known as: novoLOG Inject 16 Units into the skin 3 (three) times daily before meals.   insulin glargine 100 UNIT/ML injection Commonly known as: LANTUS Inject 77 Units into the skin 2 (two) times daily.   isosorbide mononitrate 120 MG 24 hr tablet Commonly known as: IMDUR Take 1 tablet (120 mg total) by mouth daily. What changed:   medication strength  See the  new instructions.   LORazepam 0.5 MG tablet Commonly known as: ATIVAN Take 0.5 mg by mouth every 8 (eight) hours.   meclizine 25 MG tablet Commonly known as: ANTIVERT Take 25 mg by mouth as needed for dizziness or nausea.   nitroGLYCERIN 0.4 MG SL tablet Commonly known as: Nitrostat Place 1 tablet (0.4 mg total) under the tongue every 5 (five) minutes as needed for chest pain.   NovoFine 32G X 6 MM Misc Generic drug: Insulin Pen Needle as directed.   pantoprazole 40 MG tablet Commonly known as: PROTONIX Take 1 tablet (40 mg total) by mouth daily. What changed:   when to take this  reasons to take this   ProAir HFA 108 (90 Base) MCG/ACT inhaler Generic drug: albuterol Inhale 2 puffs into the lungs every 6 (six) hours.   promethazine 25 MG tablet Commonly known as: PHENERGAN Take 25 mg by mouth every 6 (six) hours as needed for nausea or vomiting.   ranolazine 1000 MG SR tablet Commonly known as: RANEXA Take 1 tablet (1,000 mg total) by mouth 2 (two) times daily. What changed:   medication strength  See the new instructions.   rosuvastatin 5 MG tablet Commonly known as: Crestor TAKE ONE TABLET BY MOUTH ONCE DAILY FOR CHOLESTEROL What changed:   how much to take  how to take this  when to take this   torsemide 20 MG tablet Commonly known as: DEMADEX Take 60 mg in am and 40 mg in pm What changed: See the new instructions.            Durable Medical Equipment  (From admission, onward)         Start     Ordered   10/17/18 0751  Heart failure home health orders  (Heart failure home health orders / Face to face)  Once    Comments: Heart Failure Follow-up Care:  Verify follow-up appointments per Patient Discharge Instructions. Confirm transportation arranged. Reconcile home medications with discharge medication list. Remove discontinued medications from use. Assist patient/caregiver to manage medications using pill box. Reinforce low sodium food  selection Assessments: Vital signs  and oxygen saturation at each visit. Assess home environment for safety concerns, caregiver support and availability of low-sodium foods. Consult Education officer, museum, PT/OT, Dietitian, and CNA based on assessments. Perform comprehensive cardiopulmonary assessment. Notify MD for any change in condition or weight gain of 3 pounds in one day or 5 pounds in one week with symptoms. Daily Weights and Symptom Monitoring: Ensure patient has access to scales. Teach patient/caregiver to weigh daily before breakfast and after voiding using same scale and record.    Teach patient/caregiver to track weight and symptoms and when to notify Provider. Activity: Develop individualized activity plan with patient/caregiver.  HHRN 2 wk 2 for CP assessment   PT 2 wk 2 for CP Rehab.   BMET 10/24/2018 Fax results [phone]  Question Answer Comment  Heart Failure Follow-up Care Advanced Heart Failure (AHF) Clinic at [phone]   Obtain the following labs Basic Metabolic Panel   Lab frequency Other see comments   Fax lab results to AHF Clinic at [phone]   Diet Low Sodium Heart Healthy   Fluid restrictions: 2000 mL Fluid      10/17/18 0752          Disposition   The patient will be discharged in stable condition to home. Discharge Instructions    (HEART FAILURE PATIENTS) Call MD:  Anytime you have any of the following symptoms: 1) 3 pound weight gain in 24 hours or 5 pounds in 1 week 2) shortness of breath, with or without a dry hacking cough 3) swelling in the hands, feet or stomach 4) if you have to sleep on extra pillows at night in order to breathe.   Complete by: As directed    Amb Referral to Cardiac Rehabilitation   Complete by: As directed    Will send Cardiac Rehab Phase 2 order to [facility]   Diagnosis: Coronary Stents   After initial evaluation and assessments completed: Virtual Based Care may be provided alone or in conjunction with Phase 2 Cardiac  Rehab based on patient barriers.: No   Diet - low sodium heart healthy   Complete by: As directed    Heart Failure patients record your daily weight using the same scale at the same time of day   Complete by: As directed    Increase activity slowly   Complete by: As directed      Follow-up Information    Larey Dresser, MD. Go on 10/25/2018.   Specialty: Cardiology Why: 3:20 PM, [facility], parking code [phone] Contact information: [place] [address] [phone]             Duration of Discharge Encounter: Greater than 35 minutes   Signed, Marcele Kosta NP-C  10/17/2018, 8:10 AM

## 2018-10-17 NOTE — Progress Notes (Signed)
Physical Therapy Treatment Patient Details Name: [name] MRN: [id number] DOB: August 29, 1965 Today's Date: 10/17/2018    History of Present Illness This 53 y.o. female transferred from [facility]. with chest pain.   On 7/21 she underwent DES to SVG-LAD with moderate success; Dx: very aggressive CAD, Acute on chronic diastolic CHF, venous insufficiency  with h/o venous stasis ulcers, AKI on CKD stage 3.  PMH includes: obesity, hyperlipidemia     PT Comments    Pt received in bed agreeable to participation in therapy. She required min assist bed mobility due to difficulty getting out of bed with air mattress. She demonstrated mod I transfers. Supervision for safety provided for ambulation. She demonstrated ability to ambulate in room without AD. Bariatric rollator needed for hallway distances. Stair training complete. Min guard assist for ascend/descend 4 steps with L rail. Pt reports she will be able to use the L rail to enter her house. Pt positioned in recliner at end of session.    Follow Up Recommendations  Home health PT     Equipment Recommendations  Other (comment)(bariatric rollator)    Recommendations for Other Services       Precautions / Restrictions Precautions Precautions: Fall Restrictions Weight Bearing Restrictions: No    Mobility  Bed Mobility Overal bed mobility: Needs Assistance Bed Mobility: Supine to Sit     Supine to sit: Min assist     General bed mobility comments: min assist due to difficulty getting off air mattress, +rail, cues for sequencing  Transfers Overall transfer level: Modified independent Equipment used: None                Ambulation/Gait Ambulation/Gait assistance: Supervision   Assistive device: 4-wheeled walker Gait Pattern/deviations: Step-through pattern;Wide base of support;Decreased stride length Gait velocity: decreased   General Gait Details: ambulates in room without AD. Rollator needed for increased  distances.   Stairs Stairs: Yes Stairs assistance: Min guard Stair Management: One rail Left;Sideways;Step to pattern Number of Stairs: 4 General stair comments: cues for sequencing/technique and energy conservation   Wheelchair Mobility    Modified Rankin (Stroke Patients Only)       Balance Overall balance assessment: Mild deficits observed, not formally tested                                          Cognition Arousal/Alertness: Awake/alert Behavior During Therapy: WFL for tasks assessed/performed Overall Cognitive Status: Within Functional Limits for tasks assessed                                        Exercises      General Comments General comments (skin integrity, edema, etc.): VSS      Pertinent Vitals/Pain Pain Assessment: Faces Faces Pain Scale: Hurts little more Pain Location: back Pain Descriptors / Indicators: Sore;Guarding;Grimacing Pain Intervention(s): Monitored during session;Repositioned    Home Living                      Prior Function            PT Goals (current goals can now be found in the care plan section) Acute Rehab PT Goals Patient Stated Goal: go home PT Goal Formulation: With patient Time For Goal Achievement: 10/30/18 Potential to Achieve Goals: Good Progress towards  PT goals: Progressing toward goals    Frequency    Min 3X/week      PT Plan Discharge plan needs to be updated    Co-evaluation              AM-PAC PT "6 Clicks" Mobility   Outcome Measure  Help needed turning from your back to your side while in a flat bed without using bedrails?: A Little Help needed moving from lying on your back to sitting on the side of a flat bed without using bedrails?: A Little Help needed moving to and from a bed to a chair (including a wheelchair)?: None Help needed standing up from a chair using your arms (e.g., wheelchair or bedside chair)?: None Help needed to walk in  hospital room?: A Little Help needed climbing 3-5 steps with a railing? : A Little 6 Click Score: 20    End of Session   Activity Tolerance: Patient tolerated treatment well Patient left: in chair;with call bell/phone within reach Nurse Communication: Mobility status PT Visit Diagnosis: Difficulty in walking, not elsewhere classified (R26.2);Pain     Time: 8676-1950 PT Time Calculation (min) (ACUTE ONLY): 40 min  Charges:  $Gait Training: 23-37 mins $Therapeutic Activity: 8-22 mins                     Lorrin Goodell, PT  Office # [phone] Pager [phone]    [name] 10/17/2018, 10:57 AM

## 2018-10-24 ENCOUNTER — Other Ambulatory Visit: Payer: Self-pay

## 2018-10-24 ENCOUNTER — Encounter: Payer: Medicare Other | Attending: Physician Assistant | Admitting: Physician Assistant

## 2018-10-24 DIAGNOSIS — E785 Hyperlipidemia, unspecified: Secondary | ICD-10-CM | POA: Diagnosis not present

## 2018-10-24 DIAGNOSIS — E669 Obesity, unspecified: Secondary | ICD-10-CM | POA: Insufficient documentation

## 2018-10-24 DIAGNOSIS — E1151 Type 2 diabetes mellitus with diabetic peripheral angiopathy without gangrene: Secondary | ICD-10-CM | POA: Diagnosis not present

## 2018-10-24 DIAGNOSIS — I890 Lymphedema, not elsewhere classified: Secondary | ICD-10-CM | POA: Diagnosis not present

## 2018-10-24 DIAGNOSIS — I509 Heart failure, unspecified: Secondary | ICD-10-CM | POA: Insufficient documentation

## 2018-10-24 DIAGNOSIS — Z951 Presence of aortocoronary bypass graft: Secondary | ICD-10-CM | POA: Diagnosis not present

## 2018-10-24 DIAGNOSIS — Z992 Dependence on renal dialysis: Secondary | ICD-10-CM | POA: Diagnosis not present

## 2018-10-24 DIAGNOSIS — Z809 Family history of malignant neoplasm, unspecified: Secondary | ICD-10-CM | POA: Diagnosis not present

## 2018-10-24 DIAGNOSIS — I132 Hypertensive heart and chronic kidney disease with heart failure and with stage 5 chronic kidney disease, or end stage renal disease: Secondary | ICD-10-CM | POA: Insufficient documentation

## 2018-10-24 DIAGNOSIS — Z6841 Body Mass Index (BMI) 40.0 and over, adult: Secondary | ICD-10-CM | POA: Insufficient documentation

## 2018-10-24 DIAGNOSIS — N186 End stage renal disease: Secondary | ICD-10-CM | POA: Insufficient documentation

## 2018-10-24 DIAGNOSIS — E11621 Type 2 diabetes mellitus with foot ulcer: Secondary | ICD-10-CM | POA: Diagnosis present

## 2018-10-24 DIAGNOSIS — I872 Venous insufficiency (chronic) (peripheral): Secondary | ICD-10-CM | POA: Insufficient documentation

## 2018-10-24 DIAGNOSIS — E1122 Type 2 diabetes mellitus with diabetic chronic kidney disease: Secondary | ICD-10-CM | POA: Diagnosis not present

## 2018-10-24 DIAGNOSIS — E1142 Type 2 diabetes mellitus with diabetic polyneuropathy: Secondary | ICD-10-CM | POA: Diagnosis not present

## 2018-10-24 DIAGNOSIS — I2510 Atherosclerotic heart disease of native coronary artery without angina pectoris: Secondary | ICD-10-CM | POA: Diagnosis not present

## 2018-10-24 DIAGNOSIS — L97822 Non-pressure chronic ulcer of other part of left lower leg with fat layer exposed: Secondary | ICD-10-CM | POA: Insufficient documentation

## 2018-10-24 DIAGNOSIS — Z8249 Family history of ischemic heart disease and other diseases of the circulatory system: Secondary | ICD-10-CM | POA: Diagnosis not present

## 2018-10-24 DIAGNOSIS — Z881 Allergy status to other antibiotic agents status: Secondary | ICD-10-CM | POA: Diagnosis not present

## 2018-10-24 NOTE — Progress Notes (Signed)
[name], [name] ([id number]) Visit Report for 10/24/2018 Allergy List Details Patient Name: [name], [name] Date of Service: 10/24/2018 1:15 PM Medical Record Number: [id number] Patient Account Number: [id number] Date of Birth/Sex: 1966/02/21 (53 y.o. F) Treating RN: Montey Hora Primary Care Dorna Mallet: Alean Rinne Other Clinician: Referring Navy Rothschild: CLEGG, AMY Treating Alaijah Gibler/Extender: Melburn Hake, HOYT Weeks in Treatment: 0 Allergies Active Allergies Tylenol PM Reaction: hives diazepam latex Reaction: itching simvastatin Reaction: joint pain ciprofloxacin Reaction: anxiety levofloxacin Reaction: anxiety Allergy Notes Electronic Signature(s) Signed: 10/24/2018 3:29:41 PM By: Montey Hora Entered By: Montey Hora on 10/24/2018 14:36:16 Lozinski, [name] ([id number]) -------------------------------------------------------------------------------- Arrival Information Details Patient Name: [name] Date of Service: 10/24/2018 1:15 PM Medical Record Number: [id number] Patient Account Number: [id number] Date of Birth/Sex: 15-Aug-1965 (53 y.o. F) Treating RN: Harold Barban Primary Care Felton Buczynski: Alean Rinne Other Clinician: Referring Hassel Uphoff: Ninfa Meeker, AMY Treating Rachael Zapanta/Extender: Melburn Hake, HOYT Weeks in Treatment: 0 Visit Information Patient Arrived: Ambulatory Arrival Time: 14:26 Accompanied By: self Transfer Assistance: None Patient Identification Verified: Yes Secondary Verification Process Completed: Yes Electronic Signature(s) Signed: 10/24/2018 4:15:29 PM By: Lorine Bears RCP, RRT, CHT Entered By: Lorine Bears on 10/24/2018 14:29:04 [name], [name] ([id number]) -------------------------------------------------------------------------------- Clinic Level of Care Assessment Details Patient Name: [name], [name] Date of Service: 10/24/2018 1:15 PM Medical Record Number: [id number] Patient Account Number: [id number] Date of Birth/Sex:  1965-07-22 (53 y.o. F) Treating RN: Harold Barban Primary Care Lilyian Quayle: Alean Rinne Other Clinician: Referring Adalae Baysinger: CLEGG, AMY Treating Monasia Lair/Extender: Melburn Hake, HOYT Weeks in Treatment: 0 Clinic Level of Care Assessment Items TOOL 4 Quantity Score []  - Use when only an EandM is performed on FOLLOW-UP visit 0 ASSESSMENTS - Nursing Assessment / Reassessment X - Reassessment of Co-morbidities (includes updates in patient status) 1 10 X- 1 5 Reassessment of Adherence to Treatment Plan ASSESSMENTS - Wound and Skin Assessment / Reassessment []  - Simple Wound Assessment / Reassessment - one wound 0 X- 2 5 Complex Wound Assessment / Reassessment - multiple wounds []  - 0 Dermatologic / Skin Assessment (not related to wound area) ASSESSMENTS - Focused Assessment []  - Circumferential Edema Measurements - multi extremities 0 []  - 0 Nutritional Assessment / Counseling / Intervention []  - 0 Lower Extremity Assessment (monofilament, tuning fork, pulses) []  - 0 Peripheral Arterial Disease Assessment (using hand held doppler) ASSESSMENTS - Ostomy and/or Continence Assessment and Care []  - Incontinence Assessment and Management 0 []  - 0 Ostomy Care Assessment and Management (repouching, etc.) PROCESS - Coordination of Care X - Simple Patient / Family Education for ongoing care 1 15 []  - 0 Complex (extensive) Patient / Family Education for ongoing care []  - 0 Staff obtains Programmer, systems, Records, Test Results / Process Orders []  - 0 Staff telephones HHA, Nursing Homes / Clarify orders / etc []  - 0 Routine Transfer to another Facility (non-emergent condition) []  - 0 Routine Hospital Admission (non-emergent condition) []  - 0 New Admissions / Biomedical engineer / Ordering NPWT, Apligraf, etc. []  - 0 Emergency Hospital Admission (emergent condition) X- 1 10 Simple Discharge Coordination [name], [name] ([id number]) []  - 0 Complex (extensive) Discharge Coordination PROCESS -  Special Needs []  - Pediatric / Minor Patient Management 0 []  - 0 Isolation Patient Management []  - 0 Hearing / Language / Visual special needs []  - 0 Assessment of Community assistance (transportation, D/C planning, etc.) []  - 0 Additional assistance / Altered mentation []  - 0 Support Surface(s) Assessment (bed, cushion, seat, etc.) INTERVENTIONS - Wound Cleansing / Measurement []  - Simple Wound Cleansing - one wound 0  X- 2 5 Complex Wound Cleansing - multiple wounds X- 1 5 Wound Imaging (photographs - any number of wounds) []  - 0 Wound Tracing (instead of photographs) []  - 0 Simple Wound Measurement - one wound X- 2 5 Complex Wound Measurement - multiple wounds INTERVENTIONS - Wound Dressings X - Small Wound Dressing one or multiple wounds 2 10 []  - 0 Medium Wound Dressing one or multiple wounds []  - 0 Large Wound Dressing one or multiple wounds []  - 0 Application of Medications - topical []  - 0 Application of Medications - injection INTERVENTIONS - Miscellaneous []  - External ear exam 0 []  - 0 Specimen Collection (cultures, biopsies, blood, body fluids, etc.) []  - 0 Specimen(s) / Culture(s) sent or taken to Lab for analysis []  - 0 Patient Transfer (multiple staff / Civil Service fast streamer / Similar devices) []  - 0 Simple Staple / Suture removal (25 or less) []  - 0 Complex Staple / Suture removal (26 or more) []  - 0 Hypo / Hyperglycemic Management (close monitor of Blood Glucose) []  - 0 Ankle / Brachial Index (ABI) - do not check if billed separately X- 1 5 Vital Signs [name], [name] ([id number]) Has the patient been seen at the hospital within the last three years: Yes Total Score: 100 Level Of Care: New/Established - Level 3 Electronic Signature(s) Signed: 10/24/2018 4:11:30 PM By: Harold Barban Entered By: Harold Barban on 10/24/2018 15:33:31 [name], [name] ([id number]) -------------------------------------------------------------------------------- Encounter  Discharge Information Details Patient Name: [name] Date of Service: 10/24/2018 1:15 PM Medical Record Number: [id number] Patient Account Number: [id number] Date of Birth/Sex: Apr 22, 1965 (53 y.o. F) Treating RN: Army Melia Primary Care Archimedes Harold: Alean Rinne Other Clinician: Referring Matika Bartell: CLEGG, AMY Treating Tierra Divelbiss/Extender: Melburn Hake, HOYT Weeks in Treatment: 0 Encounter Discharge Information Items Post Procedure Vitals Discharge Condition: Stable Temperature (F): 98.2 Ambulatory Status: Ambulatory Pulse (bpm): 80 Discharge Destination: Home Respiratory Rate (breaths/min): 16 Transportation: Private Auto Blood Pressure (mmHg): 134/43 Accompanied By: self Schedule Follow-up Appointment: Yes Clinical Summary of Care: Electronic Signature(s) Signed: 10/24/2018 4:00:40 PM By: Army Melia Entered By: Army Melia on 10/24/2018 15:47:51 [name], Aliyanah ([id number]) -------------------------------------------------------------------------------- Lower Extremity Assessment Details Patient Name: [name] Date of Service: 10/24/2018 1:15 PM Medical Record Number: [id number] Patient Account Number: [id number] Date of Birth/Sex: 1965-04-23 (53 y.o. F) Treating RN: Montey Hora Primary Care Ronae Noell: Alean Rinne Other Clinician: Referring Loghan Kurtzman: CLEGG, AMY Treating Juleen Sorrels/Extender: Melburn Hake, HOYT Weeks in Treatment: 0 Edema Assessment Assessed: [Left: No] [Right: No] Edema: [Left: Yes] [Right: Yes] Calf Left: Right: Point of Measurement: 32 cm From Medial Instep 47 cm 44 cm Ankle Left: Right: Point of Measurement: 12 cm From Medial Instep 26 cm 24 cm Vascular Assessment Pulses: Dorsalis Pedis Palpable: [Left:Yes] [Right:Yes] Doppler Audible: [Left:Yes] [Right:Yes] Posterior Tibial Palpable: [Left:No] [Right:No] Doppler Audible: [Left:Yes] [Right:Yes] Blood Pressure: Brachial: [Left:124] Dorsalis Pedis: 124 Ankle: [Left:Dorsalis Pedis:  244] Posterior Tibial: 130 Ankle Brachial Index: [Left:1.08] [Right:1.05] Electronic Signature(s) Signed: 10/24/2018 3:29:41 PM By: Montey Hora Entered By: Montey Hora on 10/24/2018 15:08:06 Black, Mareena ([id number]) -------------------------------------------------------------------------------- Multi Wound Chart Details Patient Name: [name] Date of Service: 10/24/2018 1:15 PM Medical Record Number: [id number] Patient Account Number: [id number] Date of Birth/Sex: [birth year] (53 y.o. F) Treating RN: Harold Barban Primary Care Evi Mccomb: Alean Rinne Other Clinician: Referring Calub Tarnow: CLEGG, AMY Treating Ytzel Gubler/Extender: Melburn Hake, HOYT Weeks in Treatment: 0 Vital Signs Height(in): 67 Pulse(bpm): 80 Weight(lbs): 326 Blood Pressure(mmHg): 134/43 Body Mass Index(BMI): 51 Temperature(F): 98.2 Respiratory Rate 16 (breaths/min): Photos: [N/A:N/A] Wound Location: Left Lower Leg -  Lateral, Left Lower Leg - Lateral, Distal N/A Proximal Wounding Event: Gradually Appeared Gradually Appeared N/A Primary Etiology: Venous Leg Ulcer Venous Leg Ulcer N/A Comorbid History: Congestive Heart Failure, Congestive Heart Failure, N/A Coronary Artery Disease, Coronary Artery Disease, Hypertension, Peripheral Hypertension, Peripheral Venous Disease, Type II Venous Disease, Type II Diabetes, End Stage Renal Diabetes, End Stage Renal Disease, Neuropathy Disease, Neuropathy Date Acquired: 10/03/2018 10/03/2018 N/A Weeks of Treatment: 0 0 N/A Wound Status: Open Open N/A Measurements L x W x D 0.3x1.1x0.1 0.4x0.4x0.1 N/A (cm) Area (cm) : 0.259 0.126 N/A Volume (cm) : 0.026 0.013 N/A Classification: Full Thickness Without Full Thickness Without N/A Exposed Support Structures Exposed Support Structures Exudate Amount: Medium Medium N/A Exudate Type: Serous Serous N/A Exudate Color: amber amber N/A Wound Margin: Flat and Intact Flat and Intact N/A Granulation Amount: Large  (67-100%) Large (67-100%) N/A Granulation Quality: Pink Pink N/A Necrotic Amount: None Present (0%) None Present (0%) N/A Exposed Structures: Fat Layer (Subcutaneous Fat Layer (Subcutaneous N/A Tissue) Exposed: Yes Tissue) Exposed: Yes Fascia: No Fascia: No [name], [name] ([id number]) Tendon: No Tendon: No Muscle: No Muscle: No Joint: No Joint: No Bone: No Bone: No Epithelialization: None None N/A Treatment Notes Electronic Signature(s) Signed: 10/24/2018 4:11:30 PM By: Harold Barban Entered By: Harold Barban on 10/24/2018 15:20:20 [name], [name] ([id number]) -------------------------------------------------------------------------------- Multi-Disciplinary Care Plan Details Patient Name: [name] Date of Service: 10/24/2018 1:15 PM Medical Record Number: [id number] Patient Account Number: [id number] Date of Birth/Sex: 08/16/1965 (53 y.o. F) Treating RN: Harold Barban Primary Care Teodoro Jeffreys: Alean Rinne Other Clinician: Referring Aryeh Butterfield: CLEGG, AMY Treating Aron Inge/Extender: Melburn Hake, HOYT Weeks in Treatment: 0 Active Inactive Venous Leg Ulcer Nursing Diagnoses: Knowledge deficit related to disease process and management Goals: Patient/caregiver will verbalize understanding of disease process and disease management Date Initiated: 10/24/2018 Target Resolution Date: 11/24/2018 Goal Status: Active Interventions: Assess peripheral edema status every visit. Notes: Wound/Skin Impairment Nursing Diagnoses: Impaired tissue integrity Knowledge deficit related to ulceration/compromised skin integrity Goals: Ulcer/skin breakdown will have a volume reduction of 30% by week 4 Date Initiated: 10/24/2018 Target Resolution Date: 11/24/2018 Goal Status: Active Interventions: Assess patient/caregiver ability to obtain necessary supplies Assess patient/caregiver ability to perform ulcer/skin care regimen upon admission and as needed Assess ulceration(s) every visit Provide  education on ulcer and skin care Notes: Electronic Signature(s) Signed: 10/24/2018 4:11:30 PM By: Harold Barban Entered By: Harold Barban on 10/24/2018 15:19:17 [name], [name] ([id number]) -------------------------------------------------------------------------------- Pain Assessment Details Patient Name: [name] Date of Service: 10/24/2018 1:15 PM Medical Record Number: [id number] Patient Account Number: [id number] Date of Birth/Sex: [birth year] (53 y.o. F) Treating RN: Harold Barban Primary Care Niki Cosman: Alean Rinne Other Clinician: Referring Sativa Gelles: Ninfa Meeker, AMY Treating Loomis Anacker/Extender: Melburn Hake, HOYT Weeks in Treatment: 0 Active Problems Location of Pain Severity and Description of Pain Patient Has Paino Yes Site Locations Rate the pain. Current Pain Level: 4 Pain Management and Medication Current Pain Management: Electronic Signature(s) Signed: 10/24/2018 4:11:30 PM By: Harold Barban Signed: 10/24/2018 4:15:29 PM By: Lorine Bears RCP, RRT, CHT Entered By: Lorine Bears on 10/24/2018 14:30:38 [name], [name] ([id number]) -------------------------------------------------------------------------------- Patient/Caregiver Education Details Patient Name: [name] Date of Service: 10/24/2018 1:15 PM Medical Record Number: [id number] Patient Account Number: [id number] Date of Birth/Gender: [birth year] (53 y.o. F) Treating RN: Harold Barban Primary Care Physician: Alean Rinne Other Clinician: Referring Physician: Ninfa Meeker, AMY Treating Physician/Extender: Sharalyn Ink in Treatment: 0 Education Assessment Education Provided To: Patient Education Topics Provided Wound/Skin Impairment: Handouts: Caring for Your Ulcer Methods: Demonstration, Explain/Verbal Responses:  State content correctly Electronic Signature(s) Signed: 10/24/2018 4:11:30 PM By: Harold Barban Entered By: Harold Barban on 10/24/2018 15:20:49 [name],  [name] ([id number]) -------------------------------------------------------------------------------- Wound Assessment Details Patient Name: [name] Date of Service: 10/24/2018 1:15 PM Medical Record Number: [id number] Patient Account Number: [id number] Date of Birth/Sex: 1966-01-25 (53 y.o. F) Treating RN: Montey Hora Primary Care Shakela Donati: Alean Rinne Other Clinician: Referring Laquentin Loudermilk: CLEGG, AMY Treating Ceil Roderick/Extender: Melburn Hake, HOYT Weeks in Treatment: 0 Wound Status Wound Number: 1 Primary Venous Leg Ulcer Etiology: Wound Location: Left Lower Leg - Lateral, Proximal Wound Open Wounding Event: Gradually Appeared Status: Date Acquired: 10/03/2018 Comorbid Congestive Heart Failure, Coronary Artery Weeks Of Treatment: 0 History: Disease, Hypertension, Peripheral Venous Clustered Wound: No Disease, Type II Diabetes, End Stage Renal Disease, Neuropathy Photos Wound Measurements Length: (cm) 0.3 Width: (cm) 1.1 Depth: (cm) 0.1 Area: (cm) 0.259 Volume: (cm) 0.026 % Reduction in Area: % Reduction in Volume: Epithelialization: None Tunneling: No Undermining: No Wound Description Full Thickness Without Exposed Support Classification: Structures Wound Margin: Flat and Intact Exudate Medium Amount: Exudate Type: Serous Exudate Color: amber Foul Odor After Cleansing: No Slough/Fibrino No Wound Bed Granulation Amount: Large (67-100%) Exposed Structure Granulation Quality: Pink Fascia Exposed: No Necrotic Amount: None Present (0%) Fat Layer (Subcutaneous Tissue) Exposed: Yes Tendon Exposed: No Muscle Exposed: No Joint Exposed: No Bone Exposed: No Stribling, Alvin ([id number]) Treatment Notes Wound #1 (Left, Proximal, Lateral Lower Leg) Notes prisma, ABD, 3 layer with unna to anchor Electronic Signature(s) Signed: 10/24/2018 3:29:41 PM By: Montey Hora Entered By: Montey Hora on 10/24/2018 14:52:06 Purewal, Raquell  ([id number]) -------------------------------------------------------------------------------- Wound Assessment Details Patient Name: [name] Date of Service: 10/24/2018 1:15 PM Medical Record Number: [id number] Patient Account Number: [id number] Date of Birth/Sex: 02-23-1966 (53 y.o. F) Treating RN: Montey Hora Primary Care Jasmin Trumbull: Alean Rinne Other Clinician: Referring Jenilyn Magana: CLEGG, AMY Treating Lesslie Mckeehan/Extender: Melburn Hake, HOYT Weeks in Treatment: 0 Wound Status Wound Number: 2 Primary Venous Leg Ulcer Etiology: Wound Location: Left Lower Leg - Lateral, Distal Wound Open Wounding Event: Gradually Appeared Status: Date Acquired: 10/03/2018 Comorbid Congestive Heart Failure, Coronary Artery Weeks Of Treatment: 0 History: Disease, Hypertension, Peripheral Venous Clustered Wound: No Disease, Type II Diabetes, End Stage Renal Disease, Neuropathy Photos Wound Measurements Length: (cm) 0.4 Width: (cm) 0.4 Depth: (cm) 0.1 Area: (cm) 0.126 Volume: (cm) 0.013 % Reduction in Area: % Reduction in Volume: Epithelialization: None Tunneling: No Undermining: No Wound Description Full Thickness Without Exposed Support Foul Odo Classification: Structures Slough/F Wound Margin: Flat and Intact Exudate Medium Amount: Exudate Type: Serous Exudate Color: amber r After Cleansing: No ibrino No Wound Bed Granulation Amount: Large (67-100%) Exposed Structure Granulation Quality: Pink Fascia Exposed: No Necrotic Amount: None Present (0%) Fat Layer (Subcutaneous Tissue) Exposed: Yes Tendon Exposed: No Muscle Exposed: No Joint Exposed: No Bone Exposed: No Emigh, Babs ([id number]) Treatment Notes Wound #2 (Left, Distal, Lateral Lower Leg) Notes prisma, ABD, 3 layer with unna to anchor Electronic Signature(s) Signed: 10/24/2018 3:29:41 PM By: Montey Hora Entered By: Montey Hora on 10/24/2018 14:53:07 Howk, Esperansa  ([id number]) -------------------------------------------------------------------------------- [place] Details Patient Name: [name] Date of Service: 10/24/2018 1:15 PM Medical Record Number: [id number] Patient Account Number: [id number] Date of Birth/Sex: [birth year] (53 y.o. F) Treating RN: Harold Barban Primary Care Barre Aydelott: Alean Rinne Other Clinician: Referring Uzma Hellmer: CLEGG, AMY Treating Zyair Russi/Extender: Melburn Hake, HOYT Weeks in Treatment: 0 Vital Signs Time Taken: 14:30 Temperature (F): 98.2 Height (in): 67 Pulse (bpm): 80 Source: Stated Respiratory Rate (breaths/min): 16 Weight (lbs): 326 Blood Pressure (mmHg): 134/43  Source: Measured Reference Range: 80 - 120 mg / dl Body Mass Index (BMI): 51.1 Electronic Signature(s) Signed: 10/24/2018 4:15:29 PM By: Lorine Bears RCP, RRT, CHT Entered By: Lorine Bears on 10/24/2018 14:33:48

## 2018-10-24 NOTE — Progress Notes (Signed)
LESTA, [name] ([id number]) Visit Report for 10/24/2018 Abuse/Suicide Risk Screen Details Patient Name: [name], [name] Date of Service: 10/24/2018 1:15 PM Medical Record Number: [id number] Patient Account Number: [id number] Date of Birth/Sex: June 23, 1965 (53 y.o. F) Treating RN: Montey Hora Primary Care Ignatius Kloos: Alean Rinne Other Clinician: Referring Neville Walston: CLEGG, AMY Treating Xavier Fournier/Extender: Melburn Hake, HOYT Weeks in Treatment: 0 Abuse/Suicide Risk Screen Items Answer ABUSE RISK SCREEN: Has anyone close to you tried to hurt or harm you recentlyo No Do you feel uncomfortable with anyone in your familyo No Has anyone forced you do things that you didnot want to doo No Electronic Signature(s) Signed: 10/24/2018 3:29:41 PM By: Montey Hora Entered By: Montey Hora on 10/24/2018 14:36:35 Shareef, Jayda ([id number]) -------------------------------------------------------------------------------- Activities of Daily Living Details Patient Name: [name] Date of Service: 10/24/2018 1:15 PM Medical Record Number: [id number] Patient Account Number: [id number] Date of Birth/Sex: Jul 10, 1965 (53 y.o. F) Treating RN: Montey Hora Primary Care Aydn Ferrara: Alean Rinne Other Clinician: Referring Chosen Garron: Ninfa Meeker, AMY Treating Annaclaire Walsworth/Extender: Melburn Hake, HOYT Weeks in Treatment: 0 Activities of Daily Living Items Answer Activities of Daily Living (Please select one for each item) Drive Automobile Not Able Take Medications Completely Able Use Telephone Completely Able Care for Appearance Completely Able Use Toilet Completely Able Bath / Shower Completely Able Dress Self Completely Able Feed Self Completely Able Walk Completely Able Get In / Out Bed Completely Able Housework Completely Able Prepare Meals Completely [place] for Self Completely Able Electronic Signature(s) Signed: 10/24/2018 3:29:41 PM By: Montey Hora Entered By: Montey Hora  on 10/24/2018 14:37:01 Radke, [name] ([id number]) -------------------------------------------------------------------------------- Education Screening Details Patient Name: [name] Date of Service: 10/24/2018 1:15 PM Medical Record Number: [id number] Patient Account Number: [id number] Date of Birth/Sex: [birth year] (53 y.o. F) Treating RN: Montey Hora Primary Care Avonne Berkery: Alean Rinne Other Clinician: Referring Jessyka Austria: Ninfa Meeker, AMY Treating Harper Vandervoort/Extender: Melburn Hake, HOYT Weeks in Treatment: 0 Primary Learner Assessed: Patient Learning Preferences/Education Level/Primary Language Learning Preference: Explanation, Demonstration Highest Education Level: High School Preferred Language: English Cognitive Barrier Language Barrier: No Translator Needed: No Memory Deficit: No Emotional Barrier: No Cultural/Religious Beliefs Affecting Medical Care: No Physical Barrier Impaired Vision: No Impaired Hearing: No Decreased Hand dexterity: No Knowledge/Comprehension Knowledge Level: Medium Comprehension Level: Medium Ability to understand written Medium instructions: Ability to understand verbal Medium instructions: Motivation Anxiety Level: Calm Cooperation: Cooperative Education Importance: Acknowledges Need Interest in Health Problems: Asks Questions Perception: Coherent Willingness to Engage in Self- Medium Management Activities: Readiness to Engage in Self- Medium Management Activities: Electronic Signature(s) Signed: 10/24/2018 3:29:41 PM By: Montey Hora Entered By: Montey Hora on 10/24/2018 14:37:24 Coke, [name] ([id number]) -------------------------------------------------------------------------------- Fall Risk Assessment Details Patient Name: [name] Date of Service: 10/24/2018 1:15 PM Medical Record Number: [id number] Patient Account Number: [id number] Date of Birth/Sex: 1965/04/10 (53 y.o. F) Treating RN: Montey Hora Primary Care  Naryah Clenney: Alean Rinne Other Clinician: Referring Ramandeep Arington: CLEGG, AMY Treating Aaleyah Witherow/Extender: Melburn Hake, HOYT Weeks in Treatment: 0 Fall Risk Assessment Items Have you had 2 or more falls in the last 12 monthso 0 No Have you had any fall that resulted in injury in the last 12 monthso 0 No FALLS RISK SCREEN History of falling - immediate or within 3 months 0 No Secondary diagnosis (Do you have 2 or more medical diagnoseso) 0 No Ambulatory aid None/bed rest/wheelchair/nurse 0 No Crutches/cane/walker 15 Yes Furniture 0 No Intravenous therapy Access/Saline/Heparin Lock 0 No Gait/Transferring Normal/ bed rest/ wheelchair 0 No Weak (short steps with or without shuffle, stooped but  able to lift head while 10 Yes walking, may seek support from furniture) Impaired (short steps with shuffle, may have difficulty arising from chair, head 0 No down, impaired balance) Mental Status Oriented to own ability 0 Yes Electronic Signature(s) Signed: 10/24/2018 3:29:41 PM By: Montey Hora Entered By: Montey Hora on 10/24/2018 14:37:38 [name] ([id number]) -------------------------------------------------------------------------------- Foot Assessment Details Patient Name: [name] Date of Service: 10/24/2018 1:15 PM Medical Record Number: [id number] Patient Account Number: [id number] Date of Birth/Sex: 05/05/1965 (53 y.o. F) Treating RN: Montey Hora Primary Care Jahnasia Tatum: Alean Rinne Other Clinician: Referring Marque Rademaker: CLEGG, AMY Treating Izabella Marcantel/Extender: Melburn Hake, HOYT Weeks in Treatment: 0 Foot Assessment Items Site Locations + = Sensation present, - = Sensation absent, C = Callus, U = Ulcer R = Redness, W = Warmth, M = Maceration, PU = Pre-ulcerative lesion F = Fissure, S = Swelling, D = Dryness Assessment Right: Left: Other Deformity: No No Prior Foot Ulcer: No No Prior Amputation: No No Charcot Joint: No No Ambulatory Status: Ambulatory Without Help Gait:  Unsteady Electronic Signature(s) Signed: 10/24/2018 3:29:41 PM By: Montey Hora Entered By: Montey Hora on 10/24/2018 14:37:59 [name] ([id number]) -------------------------------------------------------------------------------- Nutrition Risk Screening Details Patient Name: [name] Date of Service: 10/24/2018 1:15 PM Medical Record Number: [id number] Patient Account Number: [id number] Date of Birth/Sex: 08-27-1965 (53 y.o. F) Treating RN: Montey Hora Primary Care Jordynn Marcella: Alean Rinne Other Clinician: Referring Apollonia Amini: CLEGG, AMY Treating Luman Holway/Extender: Melburn Hake, HOYT Weeks in Treatment: 0 Height (in): 67 Weight (lbs): 326 Body Mass Index (BMI): 51.1 Nutrition Risk Screening Items Score Screening NUTRITION RISK SCREEN: I have an illness or condition that made me change the kind and/or amount of 0 No food I eat I eat fewer than two meals per day 0 No I eat few fruits and vegetables, or milk products 0 No I have three or more drinks of beer, liquor or wine almost every day 0 No I have tooth or mouth problems that make it hard for me to eat 0 No I don't always have enough money to buy the food I need 0 No I eat alone most of the time 0 No I take three or more different prescribed or over-the-counter drugs a day 1 Yes Without wanting to, I have lost or gained 10 pounds in the last six months 0 No I am not always physically able to shop, cook and/or feed myself 0 No Nutrition Protocols Good Risk Protocol 0 No interventions needed Moderate Risk Protocol High Risk Proctocol Risk Level: Good Risk Score: 1 Electronic Signature(s) Signed: 10/24/2018 3:29:41 PM By: Montey Hora Entered By: Montey Hora on 10/24/2018 14:37:48

## 2018-10-25 ENCOUNTER — Encounter (HOSPITAL_COMMUNITY): Payer: Medicare Other | Admitting: Cardiology

## 2018-10-25 NOTE — Progress Notes (Addendum)
NAHJAE, HOEG ([id number]) Visit Report for 10/24/2018 Chief Complaint Document Details Patient Name: [name], [name] Date of Service: 10/24/2018 1:15 PM Medical Record Number: [id number] Patient Account Number: [id number] Date of Birth/Sex: [birth year] (53 y.o. F) Treating RN: Harold Barban Primary Care Provider: Alean Rinne Other Clinician: Referring Provider: Ninfa Meeker, AMY Treating Provider/Extender: Melburn Hake, Yuya Vanwingerden Weeks in Treatment: 0 Information Obtained from: Patient Chief Complaint Left LE Ulcers Electronic Signature(s) Signed: 10/24/2018 3:13:58 PM By: Worthy Keeler PA-C Entered By: Worthy Keeler on 10/24/2018 15:13:57 Selvage, Haniyyah ([id number]) -------------------------------------------------------------------------------- Debridement Details Patient Name: [name] Date of Service: 10/24/2018 1:15 PM Medical Record Number: [id number] Patient Account Number: [id number] Date of Birth/Sex: Aug 22, 1965 (53 y.o. F) Treating RN: Harold Barban Primary Care Provider: Alean Rinne Other Clinician: Referring Provider: CLEGG, AMY Treating Provider/Extender: Melburn Hake, Ayo Smoak Weeks in Treatment: 0 Debridement Performed for Wound #2 Left,Distal,Lateral Lower Leg Assessment: Performed By: Physician STONE III, Amontae Ng E., PA-C Debridement Type: Debridement Severity of Tissue Pre Fat layer exposed Debridement: Level of Consciousness (Pre- Awake and Alert procedure): Pre-procedure Verification/Time Yes - 15:29 Out Taken: Start Time: 15:29 Pain Control: Lidocaine Total Area Debrided (L x W): 0.4 (cm) x 0.4 (cm) = 0.16 (cm) Tissue and other material Viable, Non-Viable, Slough, Subcutaneous, Slough debrided: Level: Skin/Subcutaneous Tissue Debridement Description: Excisional Instrument: Curette Bleeding: Minimum Hemostasis Achieved: Pressure End Time: 15:32 Procedural Pain: 0 Post Procedural Pain: 0 Response to Treatment: Procedure was tolerated well Level of  Consciousness Awake and Alert (Post-procedure): Post Debridement Measurements of Total Wound Length: (cm) 0.4 Width: (cm) 0.4 Depth: (cm) 0.1 Volume: (cm) 0.013 Character of Wound/Ulcer Post Debridement: Improved Severity of Tissue Post Debridement: Fat layer exposed Post Procedure Diagnosis Same as Pre-procedure Electronic Signature(s) Signed: 10/24/2018 4:11:30 PM By: Harold Barban Signed: 10/24/2018 6:32:30 PM By: Worthy Keeler PA-C Entered By: Harold Barban on 10/24/2018 15:32:42 Dinius, Prosperity ([id number]) -------------------------------------------------------------------------------- HPI Details Patient Name: [name] Date of Service: 10/24/2018 1:15 PM Medical Record Number: [id number] Patient Account Number: [id number] Date of Birth/Sex: 1966-03-10 (53 y.o. F) Treating RN: Harold Barban Primary Care Provider: Alean Rinne Other Clinician: Referring Provider: Ninfa Meeker, AMY Treating Provider/Extender: Melburn Hake, Leeya Rusconi Weeks in Treatment: 0 History of Present Illness HPI Description: 10/24/2018 on evaluation today patient presents for initial evaluation or clinic concerning issues that she has been having with lymphedema for quite some time. Unfortunately she has several wound openings at this point that secondary to her lymphedema/venous stasis are giving her trouble and leaking quite severely. She also has diabetes along with hypertension and stage III chronic kidney disease. Fortunately there is no signs of active infection at this time. She is going to likely require debridement of the left leg ulcer upon evaluation today just based on what I am seeing. Fortunately there is no wound opening on the right. Overall the patient seems to be doing quite well and again there is no evidence of systemic infection which is good news. No fevers, chills, nausea, vomiting, or diarrhea. Electronic Signature(s) Signed: 10/26/2018 5:31:17 PM By: Worthy Keeler PA-C Previous  Signature: 10/26/2018 5:27:29 PM Version By: Worthy Keeler PA-C Entered By: Worthy Keeler on 10/26/2018 17:31:17 Nolt, [name] ([id number]) -------------------------------------------------------------------------------- Physical Exam Details Patient Name: [name] Date of Service: 10/24/2018 1:15 PM Medical Record Number: [id number] Patient Account Number: [id number] Date of Birth/Sex: 06-09-1965 (54 y.o. F) Treating RN: Harold Barban Primary Care Provider: Alean Rinne Other Clinician: Referring Provider: Ninfa Meeker, AMY Treating Provider/Extender: STONE III, Damica Gravlin Weeks in Treatment: 0 Constitutional patient is hypertensive.Marland Kitchen  pulse regular and within target range for patient.Marland Kitchen respirations regular, non-labored and within target range for patient.Marland Kitchen temperature within target range for patient.. Well-nourished and well-hydrated in no acute distress. Eyes conjunctiva clear no eyelid edema noted. pupils equal round and reactive to light and accommodation. Ears, Nose, Mouth, and Throat no gross abnormality of ear auricles or external auditory canals. normal hearing noted during conversation. mucus membranes moist. Respiratory normal breathing without difficulty. clear to auscultation bilaterally. Cardiovascular regular rate and rhythm with normal S1, S2. Patient has bilateral stage III lymphedema. Gastrointestinal (GI) soft, non-tender, non-distended, +BS. no ventral hernia noted. Musculoskeletal Patient unable to walk without assistance. no significant deformity or arthritic changes, no loss or range of motion, no clubbing. Psychiatric this patient is able to make decisions and demonstrates good insight into disease process. Alert and Oriented x 3. pleasant and cooperative. Notes Upon inspection patient's wounds appear to be doing okay the wound on her left which was the largest and deepest actually did require some sharp debridement today and she tolerated this debridement without  complication. Post debridement the wound bed appears to be doing significantly better. This wound that was debrided was the left distal/lateral lower leg. Electronic Signature(s) Signed: 10/26/2018 5:29:01 PM By: Worthy Keeler PA-C Entered By: Worthy Keeler on 10/26/2018 17:29:01 Parodi, [name] ([id number]) -------------------------------------------------------------------------------- Physician Orders Details Patient Name: [name] Date of Service: 10/24/2018 1:15 PM Medical Record Number: [id number] Patient Account Number: [id number] Date of Birth/Sex: 1965/04/04 (53 y.o. F) Treating RN: Harold Barban Primary Care Provider: Alean Rinne Other Clinician: Referring Provider: Ninfa Meeker, AMY Treating Provider/Extender: Melburn Hake, Jonn Chaikin Weeks in Treatment: 0 Verbal / Phone Orders: No Diagnosis Coding ICD-10 Coding Code Description I87.2 Venous insufficiency (chronic) (peripheral) L97.822 Non-pressure chronic ulcer of other part of left lower leg with fat layer exposed E11.622 Type 2 diabetes mellitus with other skin ulcer I10 Essential (primary) hypertension N18.3 Chronic kidney disease, stage 3 (moderate) Wound Cleansing Wound #1 Left,Proximal,Lateral Lower Leg o Cleanse wound with mild soap and water Wound #2 Left,Distal,Lateral Lower Leg o Cleanse wound with mild soap and water Primary Wound Dressing Wound #1 Left,Proximal,Lateral Lower Leg o Silver Collagen Wound #2 Left,Distal,Lateral Lower Leg o Silver Collagen Secondary Dressing Wound #1 Left,Proximal,Lateral Lower Leg o ABD pad Wound #2 Left,Distal,Lateral Lower Leg o ABD pad Follow-up Appointments Wound #1 Left,Proximal,Lateral Lower Leg o Return Appointment in 1 week. - Nurse Visit Thursday August 6th Wound #2 Left,Distal,Lateral Lower Leg o Return Appointment in 1 week. - Nurse Visit Thursday August 6th Edema Control Wound #1 Left,Proximal,Lateral Lower Leg o 3 Layer Compression System -  Left Lower Extremity Wound #2 Left,Distal,Lateral Lower Leg o 3 Layer Compression System - Left Lower Extremity Naramore, Madden ([id number]) Off-Loading Wound #1 Left,Proximal,Lateral Lower Leg o Other: - Elevate leg when sitting Wound #2 Left,Distal,Lateral Lower Leg o Other: - Elevate leg when sitting Electronic Signature(s) Signed: 10/24/2018 4:11:30 PM By: Harold Barban Signed: 10/24/2018 6:32:30 PM By: Worthy Keeler PA-C Entered By: Harold Barban on 10/24/2018 15:35:42 Dunker, Bambie ([id number]) -------------------------------------------------------------------------------- Problem List Details Patient Name: [name] Date of Service: 10/24/2018 1:15 PM Medical Record Number: [id number] Patient Account Number: [id number] Date of Birth/Sex: 10-14-1965 (53 y.o. F) Treating RN: Harold Barban Primary Care Provider: Alean Rinne Other Clinician: Referring Provider: Ninfa Meeker, AMY Treating Provider/Extender: Melburn Hake, Shaquasha Gerstel Weeks in Treatment: 0 Active Problems ICD-10 Evaluated Encounter Code Description Active Date Today Diagnosis I89.0 Lymphedema, not elsewhere classified 10/24/2018 No Yes I87.2 Venous insufficiency (chronic) (peripheral) 10/24/2018 No Yes L45.625  Non-pressure chronic ulcer of other part of left lower leg with 10/24/2018 No Yes fat layer exposed E11.622 Type 2 diabetes mellitus with other skin ulcer 10/24/2018 No Yes I10 Essential (primary) hypertension 10/24/2018 No Yes N18.3 Chronic kidney disease, stage 3 (moderate) 10/24/2018 No Yes Inactive Problems Resolved Problems Electronic Signature(s) Signed: 10/24/2018 6:29:39 PM By: Worthy Keeler PA-C Previous Signature: 10/24/2018 3:13:35 PM Version By: Worthy Keeler PA-C Entered By: Worthy Keeler on 10/24/2018 18:29:39 Weir, Marielle ([id number]) -------------------------------------------------------------------------------- Progress Note Details Patient Name: [name] Date of Service: 10/24/2018  1:15 PM Medical Record Number: [id number] Patient Account Number: [id number] Date of Birth/Sex: 01-29-1966 (53 y.o. F) Treating RN: Harold Barban Primary Care Provider: Alean Rinne Other Clinician: Referring Provider: Ninfa Meeker, AMY Treating Provider/Extender: Melburn Hake, Hokulani Rogel Weeks in Treatment: 0 Subjective Chief Complaint Information obtained from Patient Left LE Ulcers History of Present Illness (HPI) 10/24/2018 on evaluation today patient presents for initial evaluation or clinic concerning issues that she has been having with lymphedema for quite some time. Unfortunately she has several wound openings at this point that secondary to her lymphedema/venous stasis are giving her trouble and leaking quite severely. She also has diabetes along with hypertension and stage III chronic kidney disease. Fortunately there is no signs of active infection at this time. She is going to likely require debridement of the left leg ulcer upon evaluation today just based on what I am seeing. Fortunately there is no wound opening on the right. Overall the patient seems to be doing quite well and again there is no evidence of systemic infection which is good news. No fevers, chills, nausea, vomiting, or diarrhea. Patient History Information obtained from Patient. Allergies Tylenol PM (Reaction: hives), diazepam, latex (Reaction: itching), simvastatin (Reaction: joint pain), ciprofloxacin (Reaction: anxiety), levofloxacin (Reaction: anxiety) Family History Cancer - Father,Siblings, Diabetes - Mother,Siblings, Heart Disease - Mother,Maternal Grandparents, Hypertension - Mother,Maternal Grandparents, No family history of Hereditary Spherocytosis, Kidney Disease, Lung Disease, Seizures, Stroke, Thyroid Problems, Tuberculosis. Social History Never smoker, Marital Status - Divorced, Alcohol Use - Never, Drug Use - No History, Caffeine Use - Never. Medical History Cardiovascular Patient has history of  Congestive Heart Failure, Coronary Artery Disease - CABG 2010, Hypertension, Peripheral Venous Disease Denies history of Angina, Arrhythmia, Deep Vein Thrombosis, Hypotension, Myocardial Infarction, Peripheral Arterial Disease, Phlebitis, Vasculitis Endocrine Patient has history of Type II Diabetes Denies history of Type I Diabetes Genitourinary Patient has history of 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 Integumentary (Skin) Denies history of History of Burn, History of pressure wounds Neurologic Patient has history of Neuropathy Eudy, Karely ([id number]) Denies history of Dementia, Quadriplegia, Paraplegia, Seizure Disorder Patient is treated with Insulin. Medical And Surgical History Notes Cardiovascular HLD Review of Systems (ROS) Constitutional Symptoms (General Health) Denies complaints or symptoms of Fatigue, Fever, Chills, Marked Weight Change. Eyes Denies complaints or symptoms of Dry Eyes, Vision Changes, Glasses / Contacts. Ear/Nose/Mouth/Throat Denies complaints or symptoms of Difficult clearing ears, Sinusitis. Hematologic/Lymphatic Denies complaints or symptoms of Bleeding / Clotting Disorders, Human Immunodeficiency Virus. Respiratory Denies complaints or symptoms of Chronic or frequent coughs, Shortness of Breath. Cardiovascular Complains or has symptoms of LE edema. Denies complaints or symptoms of Chest pain. Gastrointestinal Denies complaints or symptoms of Frequent diarrhea, Nausea, Vomiting. Endocrine Denies complaints or symptoms of Hepatitis, Thyroid disease, Polydypsia (Excessive Thirst). Genitourinary Complains or has symptoms of Kidney failure/ Dialysis - CKD stage 3. Denies complaints or symptoms of Incontinence/dribbling. Immunological Denies complaints or symptoms of Hives, Itching. Integumentary (Skin)  Complains or has symptoms of Wounds, Swelling. Denies complaints or symptoms of Bleeding or bruising tendency,  Breakdown. Musculoskeletal Denies complaints or symptoms of Muscle Pain, Muscle Weakness. Neurologic Denies complaints or symptoms of Numbness/parasthesias, Focal/Weakness. Psychiatric Denies complaints or symptoms of Anxiety, Claustrophobia. Objective Constitutional patient is hypertensive.. pulse regular and within target range for patient.Marland Kitchen respirations regular, non-labored and within target range for patient.Marland Kitchen temperature within target range for patient.. Well-nourished and well-hydrated in no acute distress. Vitals Time Taken: 2:30 PM, Height: 67 in, Source: Stated, Weight: 326 lbs, Source: Measured, BMI: 51.1, Temperature: 98.2 F, Pulse: 80 bpm, Respiratory Rate: 16 breaths/min, Blood Pressure: 134/43 mmHg. Gonet, Haiven ([id number]) Eyes conjunctiva clear no eyelid edema noted. pupils equal round and reactive to light and accommodation. Ears, Nose, Mouth, and Throat no gross abnormality of ear auricles or external auditory canals. normal hearing noted during conversation. mucus membranes moist. Respiratory normal breathing without difficulty. clear to auscultation bilaterally. Cardiovascular regular rate and rhythm with normal S1, S2. Patient has bilateral stage III lymphedema. Gastrointestinal (GI) soft, non-tender, non-distended, +BS. no ventral hernia noted. Musculoskeletal Patient unable to walk without assistance. no significant deformity or arthritic changes, no loss or range of motion, no clubbing. Psychiatric this patient is able to make decisions and demonstrates good insight into disease process. Alert and Oriented x 3. pleasant and cooperative. General Notes: Upon inspection patient's wounds appear to be doing okay the wound on her left which was the largest and deepest actually did require some sharp debridement today and she tolerated this debridement without complication. Post debridement the wound bed appears to be doing significantly better. This wound that  was debrided was the left distal/lateral lower leg. Integumentary (Hair, Skin) Wound #1 status is Open. Original cause of wound was Gradually Appeared. The wound is located on the Left,Proximal,Lateral Lower Leg. The wound measures 0.3cm length x 1.1cm width x 0.1cm depth; 0.259cm^2 area and 0.026cm^3 volume. There is Fat Layer (Subcutaneous Tissue) Exposed exposed. There is no tunneling or undermining noted. There is a medium amount of serous drainage noted. The wound margin is flat and intact. There is large (67-100%) pink granulation within the wound bed. There is no necrotic tissue within the wound bed. Wound #2 status is Open. Original cause of wound was Gradually Appeared. The wound is located on the Left,Distal,Lateral Lower Leg. The wound measures 0.4cm length x 0.4cm width x 0.1cm depth; 0.126cm^2 area and 0.013cm^3 volume. There is Fat Layer (Subcutaneous Tissue) Exposed exposed. There is no tunneling or undermining noted. There is a medium amount of serous drainage noted. The wound margin is flat and intact. There is large (67-100%) pink granulation within the wound bed. There is no necrotic tissue within the wound bed. Assessment Active Problems ICD-10 Lymphedema, not elsewhere classified Venous insufficiency (chronic) (peripheral) Non-pressure chronic ulcer of other part of left lower leg with fat layer exposed Type 2 diabetes mellitus with other skin ulcer Essential (primary) hypertension Chronic kidney disease, stage 3 (moderate) Vandam, Deisha ([id number]) Procedures Wound #2 Pre-procedure diagnosis of Wound #2 is a Venous Leg Ulcer located on the Left,Distal,Lateral Lower Leg .Severity of Tissue Pre Debridement is: Fat layer exposed. There was a Excisional Skin/Subcutaneous Tissue Debridement with a total area of 0.16 sq cm performed by STONE III, Jaeden Westbay E., PA-C. With the following instrument(s): Curette to remove Viable and Non- Viable tissue/material. Material removed  includes Subcutaneous Tissue and Slough and after achieving pain control using Lidocaine. No specimens were taken. A time out was conducted at 15:29,  prior to the start of the procedure. A Minimum amount of bleeding was controlled with Pressure. The procedure was tolerated well with a pain level of 0 throughout and a pain level of 0 following the procedure. Post Debridement Measurements: 0.4cm length x 0.4cm width x 0.1cm depth; 0.013cm^3 volume. Character of Wound/Ulcer Post Debridement is improved. Severity of Tissue Post Debridement is: Fat layer exposed. Post procedure Diagnosis Wound #2: Same as Pre-Procedure Plan Wound Cleansing: Wound #1 Left,Proximal,Lateral Lower Leg: Cleanse wound with mild soap and water Wound #2 Left,Distal,Lateral Lower Leg: Cleanse wound with mild soap and water Primary Wound Dressing: Wound #1 Left,Proximal,Lateral Lower Leg: Silver Collagen Wound #2 Left,Distal,Lateral Lower Leg: Silver Collagen Secondary Dressing: Wound #1 Left,Proximal,Lateral Lower Leg: ABD pad Wound #2 Left,Distal,Lateral Lower Leg: ABD pad Follow-up Appointments: Wound #1 Left,Proximal,Lateral Lower Leg: Return Appointment in 1 week. - Nurse Visit Thursday August 6th Wound #2 Left,Distal,Lateral Lower Leg: Return Appointment in 1 week. - Nurse Visit Thursday August 6th Edema Control: Wound #1 Left,Proximal,Lateral Lower Leg: 3 Layer Compression System - Left Lower Extremity Wound #2 Left,Distal,Lateral Lower Leg: 3 Layer Compression System - Left Lower Extremity Off-Loading: Wound #1 Left,Proximal,Lateral Lower Leg: Other: - Elevate leg when sitting Wound #2 Left,Distal,Lateral Lower Leg: Other: - Elevate leg when sitting Kesinger, Teaghan ([id number]) 1. At this point I would recommend post debridement a silver collagen dressing to both wounds which I think will likely do quite well for the patient. 2. We are going to also go ahead and initiate a 3 layer compression wrap  for the left lower extremity in order to help with the edema. 3. I do recommend the patient attempt to elevate her legs as much as possible in order to keep the swelling under control. 4. We will see her back for a nurse visit on Thursday in order to rewrap her to ensure this is not sliding down which has been part of the issue in the past. We will see patient back for reevaluation in 1 week here in the clinic. If anything worsens or changes patient will contact our office for additional recommendations. Electronic Signature(s) Signed: 10/26/2018 5:31:56 PM By: Worthy Keeler PA-C Previous Signature: 10/26/2018 5:30:33 PM Version By: Worthy Keeler PA-C Entered By: Worthy Keeler on 10/26/2018 17:31:55 Saathoff, Taliana ([id number]) -------------------------------------------------------------------------------- ROS/PFSH Details Patient Name: [name] Date of Service: 10/24/2018 1:15 PM Medical Record Number: [id number] Patient Account Number: [id number] Date of Birth/Sex: 06/11/1965 (53 y.o. F) Treating RN: Montey Hora Primary Care Provider: Alean Rinne Other Clinician: Referring Provider: Ninfa Meeker, AMY Treating Provider/Extender: Melburn Hake, Jinnie Onley Weeks in Treatment: 0 Information Obtained From Patient Constitutional Symptoms (General Health) Complaints and Symptoms: Negative for: Fatigue; Fever; Chills; Marked Weight Change Eyes Complaints and Symptoms: Negative for: Dry Eyes; Vision Changes; Glasses / Contacts Ear/Nose/Mouth/Throat Complaints and Symptoms: Negative for: Difficult clearing ears; Sinusitis Hematologic/Lymphatic Complaints and Symptoms: Negative for: Bleeding / Clotting Disorders; Human Immunodeficiency Virus Respiratory Complaints and Symptoms: Negative for: Chronic or frequent coughs; Shortness of Breath Cardiovascular Complaints and Symptoms: Positive for: LE edema Negative for: Chest pain Medical History: Positive for: Congestive Heart Failure; Coronary  Artery Disease - CABG 2010; Hypertension; Peripheral Venous Disease Negative for: Angina; Arrhythmia; Deep Vein Thrombosis; Hypotension; Myocardial Infarction; Peripheral Arterial Disease; Phlebitis; Vasculitis Past Medical History Notes: HLD Gastrointestinal Complaints and Symptoms: Negative for: Frequent diarrhea; Nausea; Vomiting Endocrine Complaints and Symptoms: Negative for: Hepatitis; Thyroid disease; Polydypsia (Excessive Thirst) Marinello, Azaliah ([id number]) Medical History: Positive for: Type II Diabetes Negative for: Type I  Diabetes Time with diabetes: 1997 Treated with: Insulin Genitourinary Complaints and Symptoms: Positive for: Kidney failure/ Dialysis - CKD stage 3 Negative for: Incontinence/dribbling Medical History: Positive for: 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 Immunological Complaints and Symptoms: Negative for: Hives; Itching Integumentary (Skin) Complaints and Symptoms: Positive for: Wounds; Swelling Negative for: Bleeding or bruising tendency; Breakdown Medical History: Negative for: History of Burn; History of pressure wounds Musculoskeletal Complaints and Symptoms: Negative for: Muscle Pain; Muscle Weakness Neurologic Complaints and Symptoms: Negative for: Numbness/parasthesias; Focal/Weakness Medical History: Positive for: Neuropathy Negative for: Dementia; Quadriplegia; Paraplegia; Seizure Disorder Psychiatric Complaints and Symptoms: Negative for: Anxiety; Claustrophobia Immunizations Pneumococcal Vaccine: Received Pneumococcal Vaccination: No Implantable Devices None Family and Social History Cancer: Yes - Father,Siblings; Diabetes: Yes - Mother,Siblings; Heart Disease: Yes - Mother,Maternal Grandparents; Hereditary Spherocytosis: No; Hypertension: Yes - Mother,Maternal Grandparents; Kidney Disease: No; Lung Disease: No; Bonnes, Onna ([id number]) Seizures: No; Stroke: No; Thyroid Problems: No; Tuberculosis: No; Never smoker;  Marital Status - Divorced; Alcohol Use: Never; Drug Use: No History; Caffeine Use: Never; Financial Concerns: No; Food, Clothing or Shelter Needs: No; Support System Lacking: No; Transportation Concerns: No Electronic Signature(s) Signed: 10/24/2018 3:29:41 PM By: Montey Hora Signed: 10/24/2018 6:32:30 PM By: Worthy Keeler PA-C Entered By: Montey Hora on 10/24/2018 14:43:48 Jenson, Khalidah ([id number]) -------------------------------------------------------------------------------- SuperBill Details Patient Name: [name] Date of Service: 10/24/2018 Medical Record Number: [id number] Patient Account Number: [id number] Date of Birth/Sex: [birth year] (53 y.o. 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 I10 Essential (primary) hypertension N18.3 Chronic kidney disease, stage 3 (moderate) Facility Procedures CPT4 Code Description: [id number] - WOUND CARE VISIT-LEV 3 EST PT Modifier: Quantity: 1 CPT4 Code Description: [id number] 11042 - DEB SUBQ TISSUE 20 SQ CM/< ICD-10 Diagnosis Description L97.822 Non-pressure chronic ulcer of other part of left lower leg with Modifier: fat layer expos Quantity: 1 ed Physician Procedures CPT4 Code Description: [id number] WC PHYS LEVEL 3 o NEW PT ICD-10 Diagnosis Description I89.0 Lymphedema, not elsewhere classified I87.2 Venous insufficiency (chronic) (peripheral) L97.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

## 2018-10-27 ENCOUNTER — Other Ambulatory Visit: Payer: Self-pay

## 2018-10-27 DIAGNOSIS — E11621 Type 2 diabetes mellitus with foot ulcer: Secondary | ICD-10-CM | POA: Diagnosis not present

## 2018-10-31 ENCOUNTER — Other Ambulatory Visit: Payer: Self-pay

## 2018-10-31 ENCOUNTER — Encounter: Payer: Medicare Other | Admitting: Physician Assistant

## 2018-10-31 DIAGNOSIS — E11621 Type 2 diabetes mellitus with foot ulcer: Secondary | ICD-10-CM | POA: Diagnosis not present

## 2018-10-31 NOTE — Progress Notes (Addendum)
[name], [name] ([id number]) Visit Report for 10/31/2018 Chief Complaint Document Details Patient Name: [name], [name] Date of Service: 10/31/2018 3:00 PM Medical Record Number: [id number] Patient Account Number: [id number] Date of Birth/Sex: [birth year] (53 y.o. F) Treating RN: Harold Barban Primary Care Provider: Alean Rinne Other Clinician: Referring Provider: Alean Rinne Treating Provider/Extender: Melburn Hake, HOYT Weeks in Treatment: 1 Information Obtained from: Patient Chief Complaint Left LE Ulcers Electronic Signature(s) Signed: 10/31/2018 3:07:16 PM By: Worthy Keeler PA-C Entered By: Worthy Keeler on 10/31/2018 15:07:16 Noguchi, Cortlyn ([id number]) -------------------------------------------------------------------------------- Debridement Details Patient Name: [name] Date of Service: 10/31/2018 3:00 PM Medical Record Number: [id number] Patient Account Number: [id number] Date of Birth/Sex: [birth year] (53 y.o. F) Treating RN: Harold Barban Primary Care Provider: Alean Rinne Other Clinician: Referring Provider: Alean Rinne Treating Provider/Extender: Melburn Hake, HOYT Weeks in Treatment: 1 Debridement Performed for Wound #1 Left,Proximal,Lateral Lower Leg Assessment: Performed By: Physician STONE III, HOYT E., PA-C Debridement Type: Debridement Severity of Tissue Pre Fat layer exposed Debridement: Level of Consciousness (Pre- Awake and Alert procedure): Pre-procedure Verification/Time Yes - 15:28 Out Taken: Start Time: 15:28 Pain Control: Lidocaine Total Area Debrided (L x W): 0.3 (cm) x 1 (cm) = 0.3 (cm) Tissue and other material Viable, Non-Viable, Slough, Subcutaneous, Slough debrided: Level: Skin/Subcutaneous Tissue Debridement Description: Excisional Instrument: Curette Bleeding: None End Time: 15:30 Procedural Pain: 0 Post Procedural Pain: 0 Response to Treatment: Procedure was tolerated well Level of Consciousness Awake and  Alert (Post-procedure): Post Debridement Measurements of Total Wound Length: (cm) 0.3 Width: (cm) 1.6 Depth: (cm) 0.1 Volume: (cm) 0.038 Character of Wound/Ulcer Post Debridement: Improved Severity of Tissue Post Debridement: Fat layer exposed Post Procedure Diagnosis Same as Pre-procedure Electronic Signature(s) Signed: 10/31/2018 4:21:56 PM By: Harold Barban Signed: 10/31/2018 5:48:01 PM By: Worthy Keeler PA-C Entered By: Harold Barban on 10/31/2018 15:29:16 [name], [name] ([id number]) -------------------------------------------------------------------------------- Debridement Details Patient Name: [name] Date of Service: 10/31/2018 3:00 PM Medical Record Number: [id number] Patient Account Number: [id number] Date of Birth/Sex: November 18, 1965 (53 y.o. F) Treating RN: Harold Barban Primary Care Provider: Alean Rinne Other Clinician: Referring Provider: Alean Rinne Treating Provider/Extender: Melburn Hake, HOYT Weeks in Treatment: 1 Debridement Performed for Wound #2 Left,Distal,Lateral Lower Leg Assessment: Performed By: Physician STONE III, HOYT E., PA-C Debridement Type: Debridement Severity of Tissue Pre Fat layer exposed Debridement: Level of Consciousness (Pre- Awake and Alert procedure): Pre-procedure Verification/Time Yes - 15:28 Out Taken: Start Time: 15:28 Pain Control: Lidocaine Total Area Debrided (L x W): 0.3 (cm) x 0.4 (cm) = 0.12 (cm) Tissue and other material Viable, Non-Viable, Slough, Subcutaneous, Slough debrided: Level: Skin/Subcutaneous Tissue Debridement Description: Excisional Instrument: Curette Bleeding: None End Time: 15:30 Procedural Pain: 0 Post Procedural Pain: 0 Response to Treatment: Procedure was tolerated well Level of Consciousness Awake and Alert (Post-procedure): Post Debridement Measurements of Total Wound Length: (cm) 0.3 Width: (cm) 0.4 Depth: (cm) 0.1 Volume: (cm) 0.009 Character of Wound/Ulcer Post  Debridement: Improved Severity of Tissue Post Debridement: Fat layer exposed Post Procedure Diagnosis Same as Pre-procedure Electronic Signature(s) Signed: 10/31/2018 4:21:56 PM By: Harold Barban Signed: 10/31/2018 5:48:01 PM By: Worthy Keeler PA-C Entered By: Harold Barban on 10/31/2018 15:30:11 [name], [name] ([id number]) -------------------------------------------------------------------------------- HPI Details Patient Name: [name] Date of Service: 10/31/2018 3:00 PM Medical Record Number: [id number] Patient Account Number: [id number] Date of Birth/Sex: [birth year] (53 y.o. F) Treating RN: Harold Barban Primary Care Provider: Alean Rinne Other Clinician: Referring Provider: Alean Rinne Treating Provider/Extender: STONE III, HOYT Weeks in Treatment: 1 History of Present Illness HPI  Description: 10/24/2018 on evaluation today patient presents for initial evaluation or clinic concerning issues that she has been having with lymphedema for quite some time. Unfortunately she has several wound openings at this point that secondary to her lymphedema/venous stasis are giving her trouble and leaking quite severely. She also has diabetes along with hypertension and stage III chronic kidney disease. Fortunately there is no signs of active infection at this time. She is going to likely require debridement of the left leg ulcer upon evaluation today just based on what I am seeing. Fortunately there is no wound opening on the right. Overall the patient seems to be doing quite well and again there is no evidence of systemic infection which is good news. No fevers, chills, nausea, vomiting, or diarrhea. 10/31/2018 on evaluation today patient actually appears to be doing excellent in regard to her lower extremity ulcers. She has been tolerating the dressing changes without complication. Fortunately there is no signs of active infection at this time. She has tolerated 3 layer compression wrap  without complication. Electronic Signature(s) Signed: 10/31/2018 3:33:15 PM By: Worthy Keeler PA-C Entered By: Worthy Keeler on 10/31/2018 15:33:15 [name], [name] ([id number]) -------------------------------------------------------------------------------- Physical Exam Details Patient Name: [name] Date of Service: 10/31/2018 3:00 PM Medical Record Number: [id number] Patient Account Number: [id number] Date of Birth/Sex: 15-Jun-1965 (53 y.o. F) Treating RN: Harold Barban Primary Care Provider: Alean Rinne Other Clinician: Referring Provider: Alean Rinne Treating Provider/Extender: STONE III, HOYT Weeks in Treatment: 1 Constitutional Obese and well-hydrated in no acute distress. Respiratory normal breathing without difficulty. Psychiatric this patient is able to make decisions and demonstrates good insight into disease process. Alert and Oriented x 3. pleasant and cooperative. Notes Upon inspection patient's wound bed showed signs of good granulation there was some slough noted at both locations minimally that did require sharp debridement today. This was performed by way of debridement down to good subcutaneous tissue in order to clear away the necrotic tissue. She tolerated this without any significant pain today. Electronic Signature(s) Signed: 10/31/2018 3:33:53 PM By: Worthy Keeler PA-C Entered By: Worthy Keeler on 10/31/2018 15:33:52 [name], [name] ([id number]) -------------------------------------------------------------------------------- Physician Orders Details Patient Name: [name] Date of Service: 10/31/2018 3:00 PM Medical Record Number: [id number] Patient Account Number: [id number] Date of Birth/Sex: Jun 04, 1965 (53 y.o. F) Treating RN: Harold Barban Primary Care Provider: Alean Rinne Other Clinician: Referring Provider: Alean Rinne Treating Provider/Extender: Melburn Hake, HOYT Weeks in Treatment: 1 Verbal / Phone Orders: No Diagnosis  Coding ICD-10 Coding Code Description I89.0 Lymphedema, not elsewhere classified I87.2 Venous insufficiency (chronic) (peripheral) L97.822 Non-pressure chronic ulcer of other part of left lower leg with fat layer exposed E11.622 Type 2 diabetes mellitus with other skin ulcer I10 Essential (primary) hypertension N18.3 Chronic kidney disease, stage 3 (moderate) Wound Cleansing Wound #1 Left,Proximal,Lateral Lower Leg o Cleanse wound with mild soap and water Wound #2 Left,Distal,Lateral Lower Leg o Cleanse wound with mild soap and water Primary Wound Dressing Wound #1 Left,Proximal,Lateral Lower Leg o Silver Collagen Wound #2 Left,Distal,Lateral Lower Leg o Silver Collagen Secondary Dressing Wound #1 Left,Proximal,Lateral Lower Leg o ABD pad Wound #2 Left,Distal,Lateral Lower Leg o ABD pad Follow-up Appointments Wound #1 Left,Proximal,Lateral Lower Leg o Return Appointment in 1 week. Wound #2 Left,Distal,Lateral Lower Leg o Return Appointment in 1 week. Edema Control Wound #1 Left,Proximal,Lateral Lower Leg o 3 Layer Compression System - Left Lower Extremity Wound #2 Left,Distal,Lateral Lower Leg [name], [name] ([id number]) o 3 Layer Compression System - Left Lower Extremity Off-Loading  Wound #1 Left,Proximal,Lateral Lower Leg o Other: - Elevate leg when sitting Wound #2 Left,Distal,Lateral Lower Leg o Other: - Elevate leg when sitting Electronic Signature(s) Signed: 10/31/2018 4:21:56 PM By: Harold Barban Signed: 10/31/2018 5:48:01 PM By: Worthy Keeler PA-C Entered By: Harold Barban on 10/31/2018 15:31:35 [name], [name] ([id number]) -------------------------------------------------------------------------------- Problem List Details Patient Name: [name] Date of Service: 10/31/2018 3:00 PM Medical Record Number: [id number] Patient Account Number: [id number] Date of Birth/Sex: [birth year] (53 y.o. F) Treating RN: Harold Barban Primary  Care Provider: Alean Rinne Other Clinician: Referring Provider: Alean Rinne Treating Provider/Extender: Melburn Hake, HOYT Weeks in Treatment: 1 Active Problems ICD-10 Evaluated Encounter Code Description Active Date Today Diagnosis I89.0 Lymphedema, not elsewhere classified 10/24/2018 No Yes I87.2 Venous insufficiency (chronic) (peripheral) 10/24/2018 No Yes L97.822 Non-pressure chronic ulcer of other part of left lower leg with 10/24/2018 No Yes fat layer exposed E11.622 Type 2 diabetes mellitus with other skin ulcer 10/24/2018 No Yes I10 Essential (primary) hypertension 10/24/2018 No Yes N18.3 Chronic kidney disease, stage 3 (moderate) 10/24/2018 No Yes Inactive Problems Resolved Problems Electronic Signature(s) Signed: 10/31/2018 3:07:04 PM By: Worthy Keeler PA-C Entered By: Worthy Keeler on 10/31/2018 15:07:03 [name], [name] ([id number]) -------------------------------------------------------------------------------- Progress Note Details Patient Name: [name] Date of Service: 10/31/2018 3:00 PM Medical Record Number: [id number] Patient Account Number: [id number] Date of Birth/Sex: January 27, 1966 (53 y.o. F) Treating RN: Harold Barban Primary Care Provider: Alean Rinne Other Clinician: Referring Provider: Alean Rinne Treating Provider/Extender: Melburn Hake, HOYT Weeks in Treatment: 1 Subjective Chief Complaint Information obtained from Patient Left LE Ulcers History of Present Illness (HPI) 10/24/2018 on evaluation today patient presents for initial evaluation or clinic concerning issues that she has been having with lymphedema for quite some time. Unfortunately she has several wound openings at this point that secondary to her lymphedema/venous stasis are giving her trouble and leaking quite severely. She also has diabetes along with hypertension and stage III chronic kidney disease. Fortunately there is no signs of active infection at this time. She is going to likely  require debridement of the left leg ulcer upon evaluation today just based on what I am seeing. Fortunately there is no wound opening on the right. Overall the patient seems to be doing quite well and again there is no evidence of systemic infection which is good news. No fevers, chills, nausea, vomiting, or diarrhea. 10/31/2018 on evaluation today patient actually appears to be doing excellent in regard to her lower extremity ulcers. She has been tolerating the dressing changes without complication. Fortunately there is no signs of active infection at this time. She has tolerated 3 layer compression wrap without complication. Patient History Information obtained from Patient. Family History Cancer - Father,Siblings, Diabetes - Mother,Siblings, Heart Disease - Mother,Maternal Grandparents, Hypertension - Mother,Maternal Grandparents, No family history of Hereditary Spherocytosis, Kidney Disease, Lung Disease, Seizures, Stroke, Thyroid Problems, Tuberculosis. Social History Never smoker, Marital Status - Divorced, Alcohol Use - Never, Drug Use - No History, Caffeine Use - Never. Medical History Cardiovascular Patient has history of Congestive Heart Failure, Coronary Artery Disease - CABG 2010, Hypertension, Peripheral Venous Disease Denies history of Angina, Arrhythmia, Deep Vein Thrombosis, Hypotension, Myocardial Infarction, Peripheral Arterial Disease, Phlebitis, Vasculitis Endocrine Patient has history of Type II Diabetes Denies history of Type I Diabetes Genitourinary Patient has history of 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 Integumentary (Skin) Denies history of History of Burn, History of pressure wounds Neurologic Patient has history of Neuropathy Denies history of Dementia, Quadriplegia, Paraplegia, Seizure  Disorder Vande[place]ift, [place] ([id number]) Medical And Surgical History Notes Cardiovascular HLD Review of Systems (ROS) Constitutional Symptoms (General Health) Denies  complaints or symptoms of Fatigue, Fever, Chills, Marked Weight Change. Respiratory Denies complaints or symptoms of Chronic or frequent coughs, Shortness of Breath. Cardiovascular Complains or has symptoms of LE edema. Denies complaints or symptoms of Chest pain. Psychiatric Denies complaints or symptoms of Anxiety, Claustrophobia. Objective Constitutional Obese and well-hydrated in no acute distress. Vitals Time Taken: 3:03 PM, Height: 67 in, Weight: 326 lbs, BMI: 51.1, Temperature: 98.5 F, Pulse: 92 bpm, Respiratory Rate: 16 breaths/min, Blood Pressure: 137/67 mmHg. Respiratory normal breathing without difficulty. Psychiatric this patient is able to make decisions and demonstrates good insight into disease process. Alert and Oriented x 3. pleasant and cooperative. General Notes: Upon inspection patient's wound bed showed signs of good granulation there was some slough noted at both locations minimally that did require sharp debridement today. This was performed by way of debridement down to good subcutaneous tissue in order to clear away the necrotic tissue. She tolerated this without any significant pain today. Integumentary (Hair, Skin) Wound #1 status is Open. Original cause of wound was Gradually Appeared. The wound is located on the Left,Proximal,Lateral Lower Leg. The wound measures 0.3cm length x 1cm width x 0.1cm depth; 0.236cm^2 area and 0.024cm^3 volume. There is Fat Layer (Subcutaneous Tissue) Exposed exposed. There is no tunneling or undermining noted. There is a medium amount of serous drainage noted. The wound margin is flat and intact. There is small (1-33%) pink granulation within the wound bed. There is a large (67-100%) amount of necrotic tissue within the wound bed including Eschar. Wound #2 status is Open. Original cause of wound was Gradually Appeared. The wound is located on the Left,Distal,Lateral Lower Leg. The wound measures 0.3cm length x 0.4cm width x  0.1cm depth; 0.094cm^2 area and 0.009cm^3 volume. There is Fat Layer (Subcutaneous Tissue) Exposed exposed. There is no tunneling or undermining noted. There is a medium amount of serous drainage noted. The wound margin is flat and intact. There is small (1-33%) pink granulation within the wound bed. There is a large (67-100%) amount of necrotic tissue within the wound bed including Eschar. [name], [name] ([id number]) Assessment Active Problems ICD-10 Lymphedema, not elsewhere classified Venous insufficiency (chronic) (peripheral) Non-pressure chronic ulcer of other part of left lower leg with fat layer exposed Type 2 diabetes mellitus with other skin ulcer Essential (primary) hypertension Chronic kidney disease, stage 3 (moderate) Procedures Wound #1 Pre-procedure diagnosis of Wound #1 is a Venous Leg Ulcer located on the Left,Proximal,Lateral Lower Leg .Severity of Tissue Pre Debridement is: Fat layer exposed. There was a Excisional Skin/Subcutaneous Tissue Debridement with a total area of 0.3 sq cm performed by STONE III, HOYT E., PA-C. With the following instrument(s): Curette to remove Viable and Non-Viable tissue/material. Material removed includes Subcutaneous Tissue and Slough and after achieving pain control using Lidocaine. No specimens were taken. A time out was conducted at 15:28, prior to the start of the procedure. There was no bleeding. The procedure was tolerated well with a pain level of 0 throughout and a pain level of 0 following the procedure. Post Debridement Measurements: 0.3cm length x 1.6cm width x 0.1cm depth; 0.038cm^3 volume. Character of Wound/Ulcer Post Debridement is improved. Severity of Tissue Post Debridement is: Fat layer exposed. Post procedure Diagnosis Wound #1: Same as Pre-Procedure Wound #2 Pre-procedure diagnosis of Wound #2 is a Venous Leg Ulcer located on the Left,Distal,Lateral Lower Leg .Severity of Tissue Pre Debridement  is: Fat layer exposed.  There was a Excisional Skin/Subcutaneous Tissue Debridement with a total area of 0.12 sq cm performed by STONE III, HOYT E., PA-C. With the following instrument(s): Curette to remove Viable and Non- Viable tissue/material. Material removed includes Subcutaneous Tissue and Slough and after achieving pain control using Lidocaine. No specimens were taken. A time out was conducted at 15:28, prior to the start of the procedure. There was no bleeding. The procedure was tolerated well with a pain level of 0 throughout and a pain level of 0 following the procedure. Post Debridement Measurements: 0.3cm length x 0.4cm width x 0.1cm depth; 0.009cm^3 volume. Character of Wound/Ulcer Post Debridement is improved. Severity of Tissue Post Debridement is: Fat layer exposed. Post procedure Diagnosis Wound #2: Same as Pre-Procedure Plan Wound Cleansing: Wound #1 Left,Proximal,Lateral Lower Leg: Cleanse wound with mild soap and water Wound #2 Left,Distal,Lateral Lower Leg: Cleanse wound with mild soap and water [name], [name] ([id number]) Primary Wound Dressing: Wound #1 Left,Proximal,Lateral Lower Leg: Silver Collagen Wound #2 Left,Distal,Lateral Lower Leg: Silver Collagen Secondary Dressing: Wound #1 Left,Proximal,Lateral Lower Leg: ABD pad Wound #2 Left,Distal,Lateral Lower Leg: ABD pad Follow-up Appointments: Wound #1 Left,Proximal,Lateral Lower Leg: Return Appointment in 1 week. Wound #2 Left,Distal,Lateral Lower Leg: Return Appointment in 1 week. Edema Control: Wound #1 Left,Proximal,Lateral Lower Leg: 3 Layer Compression System - Left Lower Extremity Wound #2 Left,Distal,Lateral Lower Leg: 3 Layer Compression System - Left Lower Extremity Off-Loading: Wound #1 Left,Proximal,Lateral Lower Leg: Other: - Elevate leg when sitting Wound #2 Left,Distal,Lateral Lower Leg: Other: - Elevate leg when sitting 1. I am in a recommend that we go ahead and initiate a continuation of the Prisma dressing  which is doing a great job for her. Overall I am very pleased. 2. I am also can I suggest that we continue with a 3 layer compression wrap will just keep this in place for the next week and then see her for reevaluation at that time. 3. I recommend that she elevate her legs as much as possible she is in agreement with this plan as well. We will see patient back for reevaluation in 1 week here in the clinic. If anything worsens or changes patient will contact our office for additional recommendations. Electronic Signature(s) Signed: 10/31/2018 3:34:39 PM By: Worthy Keeler PA-C Entered By: Worthy Keeler on 10/31/2018 15:34:38 [name], [name] ([id number]) -------------------------------------------------------------------------------- ROS/PFSH Details Patient Name: [name] Date of Service: 10/31/2018 3:00 PM Medical Record Number: [id number] Patient Account Number: [id number] Date of Birth/Sex: [birth year] (54 y.o. F) Treating RN: Harold Barban Primary Care Provider: Alean Rinne Other Clinician: Referring Provider: Alean Rinne Treating Provider/Extender: Melburn Hake, HOYT Weeks in Treatment: 1 Information Obtained From Patient Constitutional Symptoms (General Health) Complaints and Symptoms: Negative for: Fatigue; Fever; Chills; Marked Weight Change Respiratory Complaints and Symptoms: Negative for: Chronic or frequent coughs; Shortness of Breath Cardiovascular Complaints and Symptoms: Positive for: LE edema Negative for: Chest pain Medical History: Positive for: Congestive Heart Failure; Coronary Artery Disease - CABG 2010; Hypertension; Peripheral Venous Disease Negative for: Angina; Arrhythmia; Deep Vein Thrombosis; Hypotension; Myocardial Infarction; Peripheral Arterial Disease; Phlebitis; Vasculitis Past Medical History Notes: HLD Psychiatric Complaints and Symptoms: Negative for: Anxiety; Claustrophobia Endocrine Medical History: Positive for: Type II  Diabetes Negative for: Type I Diabetes Time with diabetes: 1997 Treated with: Insulin Genitourinary Medical History: Positive for: 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 Integumentary (Skin) Medical History: Negative for: History of Burn; History of pressure wounds [name], [name] ([id number]) Neurologic Medical History:  Positive for: Neuropathy Negative for: Dementia; Quadriplegia; Paraplegia; Seizure Disorder Immunizations Pneumococcal Vaccine: Received Pneumococcal Vaccination: No Implantable Devices None Family and Social History Cancer: Yes - Father,Siblings; Diabetes: Yes - Mother,Siblings; Heart Disease: Yes - Mother,Maternal Grandparents; Hereditary Spherocytosis: No; Hypertension: Yes - Mother,Maternal Grandparents; Kidney Disease: No; Lung Disease: No; Seizures: No; Stroke: No; Thyroid Problems: No; Tuberculosis: No; Never smoker; Marital Status - Divorced; Alcohol Use: Never; Drug Use: No History; Caffeine Use: Never; Financial Concerns: No; Food, Clothing or Shelter Needs: No; Support System Lacking: No; Transportation Concerns: No Physician Affirmation I have reviewed and agree with the above information. Electronic Signature(s) Signed: 10/31/2018 4:21:56 PM By: Harold Barban Signed: 10/31/2018 5:48:01 PM By: Worthy Keeler PA-C Entered By: Worthy Keeler on 10/31/2018 15:33:34 Ursua, [name] ([id number]) -------------------------------------------------------------------------------- SuperBill Details Patient Name: [name] Date of Service: 10/31/2018 Medical Record Number: [id number] Patient Account Number: [id number] Date of Birth/Sex: 1965/06/30 (53 y.o. F) Treating RN: Harold Barban Primary Care Provider: Alean Rinne Other Clinician: Referring Provider: Alean Rinne Treating Provider/Extender: Melburn Hake, HOYT Weeks in Treatment: 1 Diagnosis Coding ICD-10 Codes Code Description I89.0 Lymphedema, not elsewhere classified I87.2 Venous insufficiency  (chronic) (peripheral) L97.822 Non-pressure chronic ulcer of other part of left lower leg with fat layer exposed E11.622 Type 2 diabetes mellitus with other skin ulcer I10 Essential (primary) hypertension N18.3 Chronic kidney disease, stage 3 (moderate) Facility Procedures CPT4 Code Description: [id number] 11042 - DEB SUBQ TISSUE 20 SQ CM/< ICD-10 Diagnosis Description L97.822 Non-pressure chronic ulcer of other part of left lower leg with Modifier: fat layer expos Quantity: 1 ed Physician Procedures CPT4 Code Description: [id number] 11042 - WC PHYS SUBQ TISS 20 SQ CM ICD-10 Diagnosis Description L97.822 Non-pressure chronic ulcer of other part of left lower leg with Modifier: fat layer expos Quantity: 1 ed Electronic Signature(s) Signed: 10/31/2018 3:34:48 PM By: Worthy Keeler PA-C Entered By: Worthy Keeler on 10/31/2018 15:34:48

## 2018-10-31 NOTE — Progress Notes (Signed)
[name] ([id number]) Visit Report for 10/31/2018 Arrival Information Details Patient Name: [name] Date of Service: 10/31/2018 3:00 PM Medical Record Number: [id number] Patient Account Number: [id number] Date of Birth/Sex: May 01, 1965 (53 y.o. F) Treating RN: Cornell Barman Primary Care Cricket Goodlin: Alean Rinne Other Clinician: Referring Novis League: Alean Rinne Treating Angeleen Horney/Extender: Melburn Hake, HOYT Weeks in Treatment: 1 Visit Information History Since Last Visit Added or deleted any medications: No Patient Arrived: Ambulatory Any new allergies or adverse reactions: No Arrival Time: 15:04 Had a fall or experienced change in No Accompanied By: self activities of daily living that may affect Transfer Assistance: None risk of falls: Patient Identification Verified: Yes Signs or symptoms of abuse/neglect since last visito No Secondary Verification Process Completed: Yes Hospitalized since last visit: No Implantable device outside of the clinic excluding No cellular tissue based products placed in the center since last visit: Has Dressing in Place as Prescribed: Yes Pain Present Now: No Electronic Signature(s) Signed: 10/31/2018 5:04:06 PM By: Gretta Cool, BSN, RN, CWS, Kim RN, BSN Entered By: Gretta Cool, BSN, RN, CWS, Kim on 10/31/2018 15:05:06 [name] ([id number]) -------------------------------------------------------------------------------- Encounter Discharge Information Details Patient Name: [name] Date of Service: 10/31/2018 3:00 PM Medical Record Number: [id number] Patient Account Number: [id number] Date of Birth/Sex: 10-23-1965 (53 y.o. F) Treating RN: Army Melia Primary Care Shivangi Lutz: Alean Rinne Other Clinician: Referring Lorre Opdahl: Alean Rinne Treating Fleur Audino/Extender: Melburn Hake, HOYT Weeks in Treatment: 1 Encounter Discharge Information Items Post Procedure Vitals Discharge Condition: Stable Temperature (F): 98.5 Ambulatory Status:  Ambulatory Pulse (bpm): 92 Discharge Destination: Home Respiratory Rate (breaths/min): 16 Transportation: Private Auto Blood Pressure (mmHg): 137/67 Accompanied By: self Schedule Follow-up Appointment: Yes Clinical Summary of Care: Electronic Signature(s) Signed: 10/31/2018 4:13:40 PM By: Army Melia Entered By: Army Melia on 10/31/2018 15:43:13 [name] ([id number]) -------------------------------------------------------------------------------- Lower Extremity Assessment Details Patient Name: [name] Date of Service: 10/31/2018 3:00 PM Medical Record Number: [id number] Patient Account Number: [id number] Date of Birth/Sex: [birth year] (53 y.o. F) Treating RN: Cornell Barman Primary Care Camil Wilhelmsen: Alean Rinne Other Clinician: Referring Jenniferann Stuckert: Alean Rinne Treating Kyndell Zeiser/Extender: Melburn Hake, HOYT Weeks in Treatment: 1 Edema Assessment Assessed: [Left: No] [Right: No] [Left: Edema] [Right: :] Calf Left: Right: Point of Measurement: 32 cm From Medial Instep 45.2 cm cm Ankle Left: Right: Point of Measurement: 12 cm From Medial Instep 25.3 cm cm Vascular Assessment Pulses: Dorsalis Pedis Palpable: [Left:Yes] Electronic Signature(s) Signed: 10/31/2018 5:04:06 PM By: Gretta Cool, BSN, RN, CWS, Kim RN, BSN Entered By: Gretta Cool, BSN, RN, CWS, Kim on 10/31/2018 15:16:48 [name] ([id number]) -------------------------------------------------------------------------------- Multi Wound Chart Details Patient Name: [name] Date of Service: 10/31/2018 3:00 PM Medical Record Number: [id number] Patient Account Number: [id number] Date of Birth/Sex: Mar 12, 1966 (53 y.o. F) Treating RN: Harold Barban Primary Care Taraji Mungo: Alean Rinne Other Clinician: Referring Ignatz Deis: Alean Rinne Treating Nuno Brubacher/Extender: STONE III, HOYT Weeks in Treatment: 1 Vital Signs Height(in): 72 Pulse(bpm): 92 Weight(lbs): 326 Blood Pressure(mmHg): 137/67 Body Mass Index(BMI):  51 Temperature(F): 98.5 Respiratory Rate 16 (breaths/min): Photos: [N/A:N/A] Wound Location: Left Lower Leg - Lateral, Left Lower Leg - Lateral, Distal N/A Proximal Wounding Event: Gradually Appeared Gradually Appeared N/A Primary Etiology: Venous Leg Ulcer Venous Leg Ulcer N/A Comorbid History: Congestive Heart Failure, Congestive Heart Failure, N/A Coronary Artery Disease, Coronary Artery Disease, Hypertension, Peripheral Hypertension, Peripheral Venous Disease, Type II Venous Disease, Type II Diabetes, End Stage Renal Diabetes, End Stage Renal Disease, Neuropathy Disease, Neuropathy Date Acquired: 10/03/2018 10/03/2018 N/A Weeks of Treatment: 1 1 N/A Wound Status: Open Open N/A Measurements L x  W x D 0.3x1x0.1 0.3x0.4x0.1 N/A (cm) Area (cm) : 0.236 0.094 N/A Volume (cm) : 0.024 0.009 N/A % Reduction in Area: 8.90% 25.40% N/A % Reduction in Volume: 7.70% 30.80% N/A Classification: Full Thickness Without Full Thickness Without N/A Exposed Support Structures Exposed Support Structures Exudate Amount: Medium Medium N/A Exudate Type: Serous Serous N/A Exudate Color: amber amber N/A Wound Margin: Flat and Intact Flat and Intact N/A Granulation Amount: Small (1-33%) Small (1-33%) N/A Granulation Quality: Pink Pink N/A Necrotic Amount: Large (67-100%) Large (67-100%) N/A Necrotic Tissue: Eschar Eschar N/A [name] ([id number]) Exposed Structures: Fat Layer (Subcutaneous Fat Layer (Subcutaneous N/A Tissue) Exposed: Yes Tissue) Exposed: Yes Fascia: No Fascia: No Tendon: No Tendon: No Muscle: No Muscle: No Joint: No Joint: No Bone: No Bone: No Epithelialization: None None N/A Treatment Notes Electronic Signature(s) Signed: 10/31/2018 4:21:56 PM By: Harold Barban Entered By: Harold Barban on 10/31/2018 15:26:47 [name] ([id number]) -------------------------------------------------------------------------------- Multi-Disciplinary Care Plan  Details Patient Name: [name] Date of Service: 10/31/2018 3:00 PM Medical Record Number: [id number] Patient Account Number: [id number] Date of Birth/Sex: 1965/07/16 (53 y.o. F) Treating RN: Harold Barban Primary Care Joci Dress: Alean Rinne Other Clinician: Referring Rainn Zupko: Alean Rinne Treating Deloria Brassfield/Extender: Melburn Hake, HOYT Weeks in Treatment: 1 Active Inactive Venous Leg Ulcer Nursing Diagnoses: Knowledge deficit related to disease process and management Goals: Patient/caregiver will verbalize understanding of disease process and disease management Date Initiated: 10/24/2018 Target Resolution Date: 11/24/2018 Goal Status: Active Interventions: Assess peripheral edema status every visit. Notes: Wound/Skin Impairment Nursing Diagnoses: Impaired tissue integrity Knowledge deficit related to ulceration/compromised skin integrity Goals: Ulcer/skin breakdown will have a volume reduction of 30% by week 4 Date Initiated: 10/24/2018 Target Resolution Date: 11/24/2018 Goal Status: Active Interventions: Assess patient/caregiver ability to obtain necessary supplies Assess patient/caregiver ability to perform ulcer/skin care regimen upon admission and as needed Assess ulceration(s) every visit Provide education on ulcer and skin care Notes: Electronic Signature(s) Signed: 10/31/2018 4:21:56 PM By: Harold Barban Entered By: Harold Barban on 10/31/2018 15:26:33 Eyer, Cortez ([id number]) -------------------------------------------------------------------------------- Pain Assessment Details Patient Name: [name] Date of Service: 10/31/2018 3:00 PM Medical Record Number: [id number] Patient Account Number: [id number] Date of Birth/Sex: October 20, 1965 (53 y.o. F) Treating RN: Cornell Barman Primary Care Hosea Hanawalt: Alean Rinne Other Clinician: Referring Keilah Lemire: Alean Rinne Treating Anasofia Micallef/Extender: Melburn Hake, HOYT Weeks in Treatment: 1 Active Problems Location of Pain  Severity and Description of Pain Patient Has Paino No Site Locations Pain Management and Medication Current Pain Management: Electronic Signature(s) Signed: 10/31/2018 5:04:06 PM By: Gretta Cool, BSN, RN, CWS, Kim RN, BSN Entered By: Gretta Cool, BSN, RN, CWS, Kim on 10/31/2018 15:05:12 BAYLA, MCGOVERN ([id number]) -------------------------------------------------------------------------------- Patient/Caregiver Education Details Patient Name: [name] Date of Service: 10/31/2018 3:00 PM Medical Record Number: [id number] Patient Account Number: [id number] Date of Birth/Gender: 1965-09-18 (53 y.o. F) Treating RN: Harold Barban Primary Care Physician: Alean Rinne Other Clinician: Referring Physician: Alean Rinne Treating Physician/Extender: Sharalyn Ink in Treatment: 1 Education Assessment Education Provided To: Patient Education Topics Provided Wound/Skin Impairment: Handouts: Caring for Your Ulcer Methods: Demonstration, Explain/Verbal Responses: State content correctly Electronic Signature(s) Signed: 10/31/2018 4:21:56 PM By: Harold Barban Entered By: Harold Barban on 10/31/2018 15:27:11 Bollig, Nelline ([id number]) -------------------------------------------------------------------------------- Wound Assessment Details Patient Name: [name] Date of Service: 10/31/2018 3:00 PM Medical Record Number: [id number] Patient Account Number: [id number] Date of Birth/Sex: 1966/02/06 (53 y.o. F) Treating RN: Cornell Barman Primary Care Donnette Macmullen: Alean Rinne Other Clinician: Referring Breane Grunwald: Alean Rinne Treating Molina Hollenback/Extender: STONE III, HOYT Weeks in Treatment: 1 Wound Status  Wound Number: 1 Primary Venous Leg Ulcer Etiology: Wound Location: Left Lower Leg - Lateral, Proximal Wound Open Wounding Event: Gradually Appeared Status: Date Acquired: 10/03/2018 Comorbid Congestive Heart Failure, Coronary Artery Weeks Of Treatment: 1 History: Disease, Hypertension,  Peripheral Venous Clustered Wound: No Disease, Type II Diabetes, End Stage Renal Disease, Neuropathy Photos Wound Measurements Length: (cm) 0.3 Width: (cm) 1 Depth: (cm) 0.1 Area: (cm) 0.236 Volume: (cm) 0.024 % Reduction in Area: 8.9% % Reduction in Volume: 7.7% Epithelialization: None Tunneling: No Undermining: No Wound Description Full Thickness Without Exposed Support Classification: Structures Wound Margin: Flat and Intact Exudate Medium Amount: Exudate Type: Serous Exudate Color: amber Foul Odor After Cleansing: No Slough/Fibrino No Wound Bed Granulation Amount: Small (1-33%) Exposed Structure Granulation Quality: Pink Fascia Exposed: No Necrotic Amount: Large (67-100%) Fat Layer (Subcutaneous Tissue) Exposed: Yes Necrotic Quality: Eschar Tendon Exposed: No Muscle Exposed: No Joint Exposed: No Bone Exposed: No Croom, Aijah ([id number]) Treatment Notes Wound #1 (Left, Proximal, Lateral Lower Leg) Notes prisma, ABD, 3 layer Electronic Signature(s) Signed: 10/31/2018 5:04:06 PM By: Gretta Cool, BSN, RN, CWS, Kim RN, BSN Entered By: Gretta Cool, BSN, RN, CWS, Kim on 10/31/2018 15:14:35 Sames, Malachy [name] ([id number]) -------------------------------------------------------------------------------- Wound Assessment Details Patient Name: [name] Date of Service: 10/31/2018 3:00 PM Medical Record Number: [id number] Patient Account Number: [id number] Date of Birth/Sex: 27-Nov-1965 (53 y.o. F) Treating RN: Cornell Barman Primary Care [place] Luppino: Alean Rinne Other Clinician: Referring Vartan Kerins: Alean Rinne Treating Tyrease Vandeberg/Extender: Melburn Hake, HOYT Weeks in Treatment: 1 Wound Status Wound Number: 2 Primary Venous Leg Ulcer Etiology: Wound Location: Left Lower Leg - Lateral, Distal Wound Open Wounding Event: Gradually Appeared Status: Date Acquired: 10/03/2018 Comorbid Congestive Heart Failure, Coronary Artery Weeks Of Treatment: 1 History: Disease, Hypertension,  Peripheral Venous Clustered Wound: No Disease, Type II Diabetes, End Stage Renal Disease, Neuropathy Photos Wound Measurements Length: (cm) 0.3 Width: (cm) 0.4 Depth: (cm) 0.1 Area: (cm) 0.094 Volume: (cm) 0.009 % Reduction in Area: 25.4% % Reduction in Volume: 30.8% Epithelialization: None Tunneling: No Undermining: No Wound Description Full Thickness Without Exposed Support Foul Od Classification: Structures Slough/ Wound Margin: Flat and Intact Exudate Medium Amount: Exudate Type: Serous Exudate Color: amber or After Cleansing: No Fibrino No Wound Bed Granulation Amount: Small (1-33%) Exposed Structure Granulation Quality: Pink Fascia Exposed: No Necrotic Amount: Large (67-100%) Fat Layer (Subcutaneous Tissue) Exposed: Yes Necrotic Quality: Eschar Tendon Exposed: No Muscle Exposed: No Joint Exposed: No Bone Exposed: No Cappelli, Cydni ([id number]) Treatment Notes Wound #2 (Left, Distal, Lateral Lower Leg) Notes prisma, ABD, 3 layer Electronic Signature(s) Signed: 10/31/2018 5:04:06 PM By: Gretta Cool, BSN, RN, CWS, Kim RN, BSN Entered By: Gretta Cool, BSN, RN, CWS, Kim on 10/31/2018 15:15:17 Hedman, Malachy [name] ([id number]) -------------------------------------------------------------------------------- [place] Details Patient Name: [name] Date of Service: 10/31/2018 3:00 PM Medical Record Number: [id number] Patient Account Number: [id number] Date of Birth/Sex: [birth year] (53 y.o. F) Treating RN: Cornell Barman Primary Care Pavneet Markwood: Alean Rinne Other Clinician: Referring Takeshi Teasdale: Alean Rinne Treating Seanpatrick Maisano/Extender: Melburn Hake, HOYT Weeks in Treatment: 1 Vital Signs Time Taken: 15:03 Temperature (F): 98.5 Height (in): 67 Pulse (bpm): 92 Weight (lbs): 326 Respiratory Rate (breaths/min): 16 Body Mass Index (BMI): 51.1 Blood Pressure (mmHg): 137/67 Reference Range: 80 - 120 mg / dl Electronic Signature(s) Signed: 10/31/2018 5:04:06 PM By: Gretta Cool, BSN, RN,  CWS, Kim RN, BSN Entered By: Gretta Cool, BSN, RN, CWS, Kim on 10/31/2018 15:05:35

## 2018-11-01 NOTE — Progress Notes (Addendum)
ZAHRIA, DING ([id number]) Visit Report for 10/27/2018 Arrival Information Details Patient Name: [name], [name] Date of Service: 10/27/2018 2:00 PM Medical Record Number: [id number] Patient Account Number: [id number] Date of Birth/Sex: 1965/07/14 (53 y.o. F) Treating RN: Cornell Barman Primary Care Virgil Slinger: Alean Rinne Other Clinician: Referring Shaquina Gillham: Alean Rinne Treating Kanan Sobek/Extender: Melburn Hake, HOYT Weeks in Treatment: 0 Visit Information History Since Last Visit Added or deleted any medications: No Patient Arrived: Ambulatory Any new allergies or adverse reactions: No Arrival Time: 09:57 Had a fall or experienced change in No Accompanied By: self activities of daily living that may affect Transfer Assistance: None risk of falls: Patient Identification Verified: Yes Signs or symptoms of abuse/neglect since last visito No Secondary Verification Process Completed: Yes Hospitalized since last visit: No Patient Requires Transmission-Based No Implantable device outside of the clinic excluding No Precautions: cellular tissue based products placed in the center Patient Has Alerts: No since last visit: Has Dressing in Place as Prescribed: Yes Pain Present Now: No Electronic Signature(s) Signed: 11/01/2018 9:58:09 AM By: Gretta Cool, BSN, RN, CWS, Kim RN, BSN Entered By: Gretta Cool, BSN, RN, CWS, Kim on 11/01/2018 09:58:09 [name], [name] ([id number]) -------------------------------------------------------------------------------- Compression Therapy Details Patient Name: [name] Date of Service: 10/27/2018 2:00 PM Medical Record Number: [id number] Patient Account Number: [id number] Date of Birth/Sex: 1965-04-16 (53 y.o. F) Treating RN: Cornell Barman Primary Care Gaylan Fauver: Alean Rinne Other Clinician: Referring Grover Robinson: Alean Rinne Treating Takeyah Wieman/Extender: Melburn Hake, HOYT Weeks in Treatment: 0 Compression Therapy Performed for Wound Assessment: Wound #1 Left,Proximal,Lateral  Lower Leg Performed By: Clinician Cornell Barman, RN Compression Type: Three Layer Pre Treatment ABI: 1.1 Electronic Signature(s) Signed: 11/01/2018 10:00:29 AM By: Gretta Cool, BSN, RN, CWS, Kim RN, BSN Entered By: Gretta Cool, BSN, RN, CWS, Kim on 11/01/2018 10:00:28 [name] ([id number]) -------------------------------------------------------------------------------- Compression Therapy Details Patient Name: [name] Date of Service: 10/27/2018 2:00 PM Medical Record Number: [id number] Patient Account Number: [id number] Date of Birth/Sex: 10/11/1965 (53 y.o. F) Treating RN: Cornell Barman Primary Care Chayanne Speir: Alean Rinne Other Clinician: Referring Orville Mena: Alean Rinne Treating Adith Tejada/Extender: Melburn Hake, HOYT Weeks in Treatment: 0 Compression Therapy Performed for Wound Assessment: Wound #2 Left,Distal,Lateral Lower Leg Performed By: Clinician Cornell Barman, RN Compression Type: Three Layer Pre Treatment ABI: 1.1 Electronic Signature(s) Signed: 11/01/2018 10:00:29 AM By: Gretta Cool, BSN, RN, CWS, Kim RN, BSN Entered By: Gretta Cool, BSN, RN, CWS, Kim on 11/01/2018 10:00:29 [name], [name] ([id number]) -------------------------------------------------------------------------------- Encounter Discharge Information Details Patient Name: [name] Date of Service: 10/27/2018 2:00 PM Medical Record Number: [id number] Patient Account Number: [id number] Date of Birth/Sex: [birth year] (53 y.o. F) Treating RN: Cornell Barman Primary Care Aliana Kreischer: Alean Rinne Other Clinician: Referring Annasofia Pohl: Alean Rinne Treating Blanca Carreon/Extender: Melburn Hake, HOYT Weeks in Treatment: 0 Encounter Discharge Information Items Discharge Condition: Stable Ambulatory Status: Ambulatory Discharge Destination: Home Transportation: Private Auto Accompanied By: self Schedule Follow-up Appointment: Yes Clinical Summary of Care: Electronic Signature(s) Signed: 11/01/2018 10:01:27 AM By: Gretta Cool, BSN, RN, CWS, Kim RN,  BSN Entered By: Gretta Cool, BSN, RN, CWS, Kim on 11/01/2018 10:01:27 [name] ([id number]) -------------------------------------------------------------------------------- Wound Assessment Details Patient Name: [name] Date of Service: 10/27/2018 2:00 PM Medical Record Number: [id number] Patient Account Number: [id number] Date of Birth/Sex: 13-Dec-1965 (53 y.o. F) Treating RN: Cornell Barman Primary Care Thoms Barthelemy: Alean Rinne Other Clinician: Referring Salvatore Shear: Alean Rinne Treating Lela Murfin/Extender: Melburn Hake, HOYT Weeks in Treatment: 0 Wound Status Wound Number: 1 Primary Etiology: Venous Leg Ulcer Wound Location: Left, Proximal, Lateral Lower Leg Wound Status: Open Wounding Event: Gradually Appeared Date Acquired: 10/03/2018 Weeks Of Treatment:  0 Clustered Wound: No Wound Measurements Length: (cm) 0.3 Width: (cm) 1.1 Depth: (cm) 0.1 Area: (cm) 0.259 Volume: (cm) 0.026 % Reduction in Area: 0% % Reduction in Volume: 0% Wound Description Full Thickness Without Exposed Support Classification: Structures Treatment Notes Wound #1 (Left, Proximal, Lateral Lower Leg) Notes SIlver Collagen, ABD, 3L(L) Electronic Signature(s) Signed: 11/02/2018 5:00:13 PM By: Gretta Cool, BSN, RN, CWS, Kim RN, BSN Entered By: Gretta Cool, BSN, RN, CWS, Kim on 11/01/2018 09:58:26 [name], [name] ([id number]) -------------------------------------------------------------------------------- Wound Assessment Details Patient Name: [name] Date of Service: 10/27/2018 2:00 PM Medical Record Number: [id number] Patient Account Number: [id number] Date of Birth/Sex: 12-15-1965 (53 y.o. F) Treating RN: Cornell Barman Primary Care Cait Locust: Alean Rinne Other Clinician: Referring Astha Probasco: Alean Rinne Treating Mescal Flinchbaugh/Extender: Melburn Hake, HOYT Weeks in Treatment: 0 Wound Status Wound Number: 2 Primary Etiology: Venous Leg Ulcer Wound Location: Left, Distal, Lateral Lower Leg Wound Status: Open Wounding  Event: Gradually Appeared Date Acquired: 10/03/2018 Weeks Of Treatment: 0 Clustered Wound: No Wound Measurements Length: (cm) 0.4 Width: (cm) 0.4 Depth: (cm) 0.1 Area: (cm) 0.126 Volume: (cm) 0.013 % Reduction in Area: 0% % Reduction in Volume: 0% Wound Description Full Thickness Without Exposed Support Classification: Structures Treatment Notes Wound #2 (Left, Distal, Lateral Lower Leg) Notes SIlver Collagen, ABD, 3L(L) Electronic Signature(s) Signed: 11/02/2018 5:00:13 PM By: Gretta Cool, BSN, RN, CWS, Kim RN, BSN Entered By: Gretta Cool, BSN, RN, CWS, Kim on 11/01/2018 09:58:27

## 2018-11-07 ENCOUNTER — Other Ambulatory Visit: Payer: Self-pay

## 2018-11-07 ENCOUNTER — Encounter: Payer: Medicare Other | Admitting: Physician Assistant

## 2018-11-07 DIAGNOSIS — E11621 Type 2 diabetes mellitus with foot ulcer: Secondary | ICD-10-CM | POA: Diagnosis not present

## 2018-11-07 NOTE — Progress Notes (Addendum)
[name], [name] ([id number]) Visit Report for 11/07/2018 Chief Complaint Document Details Patient Name: [name], [name] Date of Service: 11/07/2018 3:00 PM Medical Record Number: [id number] Patient Account Number: [id number] Date of Birth/Sex: 07/01/1965 (53 y.o. F) Treating RN: Harold Barban Primary Care Provider: Alean Rinne Other Clinician: Referring Provider: Alean Rinne Treating Provider/Extender: Melburn Hake, Safir Michalec Weeks in Treatment: 2 Information Obtained from: Patient Chief Complaint Left LE Ulcers Electronic Signature(s) Signed: 11/07/2018 3:30:21 PM By: Worthy Keeler PA-C Entered By: Worthy Keeler on 11/07/2018 15:30:21 [name], [name] ([id number]) -------------------------------------------------------------------------------- Debridement Details Patient Name: [name] Date of Service: 11/07/2018 3:00 PM Medical Record Number: [id number] Patient Account Number: [id number] Date of Birth/Sex: 08/15/1965 (53 y.o. F) Treating RN: Harold Barban Primary Care Provider: Alean Rinne Other Clinician: Referring Provider: Alean Rinne Treating Provider/Extender: Melburn Hake, Anoushka Divito Weeks in Treatment: 2 Debridement Performed for Wound #1 Left,Proximal,Lateral Lower Leg Assessment: Performed By: Physician STONE III, Novak Stgermaine E., PA-C Debridement Type: Debridement Severity of Tissue Pre Fat layer exposed Debridement: Level of Consciousness (Pre- Awake and Alert procedure): Pre-procedure Verification/Time Yes - 15:33 Out Taken: Start Time: 15:33 Pain Control: Lidocaine Total Area Debrided (L x W): 0.4 (cm) x 0.6 (cm) = 0.24 (cm) Tissue and other material Viable, Non-Viable, Slough, Subcutaneous, Slough debrided: Level: Skin/Subcutaneous Tissue Debridement Description: Excisional Instrument: Curette Bleeding: Minimum Hemostasis Achieved: Pressure End Time: 15:34 Procedural Pain: 0 Post Procedural Pain: 0 Response to Treatment: Procedure was tolerated well Level of  Consciousness Awake and Alert (Post-procedure): Post Debridement Measurements of Total Wound Length: (cm) 0.4 Width: (cm) 0.6 Depth: (cm) 0.1 Volume: (cm) 0.019 Character of Wound/Ulcer Post Debridement: Improved Severity of Tissue Post Debridement: Fat layer exposed Post Procedure Diagnosis Same as Pre-procedure Electronic Signature(s) Signed: 11/07/2018 4:31:36 PM By: Harold Barban Signed: 11/07/2018 5:18:58 PM By: Worthy Keeler PA-C Entered By: Harold Barban on 11/07/2018 15:34:39 [name], [name] ([id number]) -------------------------------------------------------------------------------- HPI Details Patient Name: [name] Date of Service: 11/07/2018 3:00 PM Medical Record Number: [id number] Patient Account Number: [id number] Date of Birth/Sex: [birth year] (53 y.o. F) Treating RN: Harold Barban Primary Care Provider: Alean Rinne Other Clinician: Referring Provider: Alean Rinne Treating Provider/Extender: Melburn Hake, Samaria Anes Weeks in Treatment: 2 History of Present Illness HPI Description: 10/24/2018 on evaluation today patient presents for initial evaluation or clinic concerning issues that she has been having with lymphedema for quite some time. Unfortunately she has several wound openings at this point that secondary to her lymphedema/venous stasis are giving her trouble and leaking quite severely. She also has diabetes along with hypertension and stage III chronic kidney disease. Fortunately there is no signs of active infection at this time. She is going to likely require debridement of the left leg ulcer upon evaluation today just based on what I am seeing. Fortunately there is no wound opening on the right. Overall the patient seems to be doing quite well and again there is no evidence of systemic infection which is good news. No fevers, chills, nausea, vomiting, or diarrhea. 10/31/2018 on evaluation today patient actually appears to be doing excellent in regard to her  lower extremity ulcers. She has been tolerating the dressing changes without complication. Fortunately there is no signs of active infection at this time. She has tolerated 3 layer compression wrap without complication. 11/07/2018 upon evaluation today patient actually appears to be doing very well with regard to her left lower extremity ulcers. She has been tolerating the dressing changes without complication. Fortunately there is no signs of active infection. No fevers, chills, nausea, vomiting, or  diarrhea. Electronic Signature(s) Signed: 11/07/2018 3:38:24 PM By: Worthy Keeler PA-C Entered By: Worthy Keeler on 11/07/2018 15:38:24 [name], [name] ([id number]) -------------------------------------------------------------------------------- Physical Exam Details Patient Name: [name] Date of Service: 11/07/2018 3:00 PM Medical Record Number: [id number] Patient Account Number: [id number] Date of Birth/Sex: Jul 11, 1965 (53 y.o. F) Treating RN: Harold Barban Primary Care Provider: Alean Rinne Other Clinician: Referring Provider: Alean Rinne Treating Provider/Extender: STONE III, Stuart Mirabile Weeks in Treatment: 2 Constitutional Well-nourished and well-hydrated in no acute distress. Respiratory normal breathing without difficulty. Psychiatric this patient is able to make decisions and demonstrates good insight into disease process. Alert and Oriented x 3. pleasant and cooperative. Notes Patient's wound bed currently showed signs of good granulation at this time there was some slough noted on the proximal ulcer which I did actually perform sharp debridement on she tolerated this with only minimal discomfort post debridement wound bed appears to be doing much better which is great news. The distal wound did not require any debridement at this time. Electronic Signature(s) Signed: 11/07/2018 3:38:52 PM By: Worthy Keeler PA-C Entered By: Worthy Keeler on 11/07/2018 15:38:52 [name],  [name] ([id number]) -------------------------------------------------------------------------------- Physician Orders Details Patient Name: [name] Date of Service: 11/07/2018 3:00 PM Medical Record Number: [id number] Patient Account Number: [id number] Date of Birth/Sex: 03-04-1966 (53 y.o. F) Treating RN: Harold Barban Primary Care Provider: Alean Rinne Other Clinician: Referring Provider: Alean Rinne Treating Provider/Extender: Melburn Hake, Majel Giel Weeks in Treatment: 2 Verbal / Phone Orders: No Diagnosis Coding ICD-10 Coding Code Description I89.0 Lymphedema, not elsewhere classified I87.2 Venous insufficiency (chronic) (peripheral) L97.822 Non-pressure chronic ulcer of other part of left lower leg with fat layer exposed E11.622 Type 2 diabetes mellitus with other skin ulcer I10 Essential (primary) hypertension N18.3 Chronic kidney disease, stage 3 (moderate) Wound Cleansing Wound #1 Left,Proximal,Lateral Lower Leg o Cleanse wound with mild soap and water Wound #2 Left,Distal,Lateral Lower Leg o Cleanse wound with mild soap and water Primary Wound Dressing Wound #1 Left,Proximal,Lateral Lower Leg o Silver Collagen Wound #2 Left,Distal,Lateral Lower Leg o Silver Collagen Secondary Dressing Wound #1 Left,Proximal,Lateral Lower Leg o ABD pad Wound #2 Left,Distal,Lateral Lower Leg o ABD pad Follow-up Appointments Wound #1 Left,Proximal,Lateral Lower Leg o Return Appointment in 1 week. Wound #2 Left,Distal,Lateral Lower Leg o Return Appointment in 1 week. Edema Control Wound #1 Left,Proximal,Lateral Lower Leg o 3 Layer Compression System - Left Lower Extremity Wound #2 Left,Distal,Lateral Lower Leg Monteforte, Ezell ([id number]) o 3 Layer Compression System - Left Lower Extremity Off-Loading Wound #1 Left,Proximal,Lateral Lower Leg o Other: - Elevate leg when sitting Wound #2 Left,Distal,Lateral Lower Leg o Other: - Elevate leg when  sitting Electronic Signature(s) Signed: 11/07/2018 4:31:36 PM By: Harold Barban Signed: 11/07/2018 5:18:58 PM By: Worthy Keeler PA-C Entered By: Harold Barban on 11/07/2018 15:35:37 [name], [name] ([id number]) -------------------------------------------------------------------------------- Problem List Details Patient Name: [name] Date of Service: 11/07/2018 3:00 PM Medical Record Number: [id number] Patient Account Number: [id number] Date of Birth/Sex: 02-17-1966 (53 y.o. F) Treating RN: Harold Barban Primary Care Provider: Alean Rinne Other Clinician: Referring Provider: Alean Rinne Treating Provider/Extender: Melburn Hake, Lester Crickenberger Weeks in Treatment: 2 Active Problems ICD-10 Evaluated Encounter Code Description Active Date Today Diagnosis I89.0 Lymphedema, not elsewhere classified 10/24/2018 No Yes I87.2 Venous insufficiency (chronic) (peripheral) 10/24/2018 No Yes L97.822 Non-pressure chronic ulcer of other part of left lower leg with 10/24/2018 No Yes fat layer exposed E11.622 Type 2 diabetes mellitus with other skin ulcer 10/24/2018 No Yes I10 Essential (primary) hypertension 10/24/2018 No Yes N18.3  Chronic kidney disease, stage 3 (moderate) 10/24/2018 No Yes Inactive Problems Resolved Problems Electronic Signature(s) Signed: 11/07/2018 3:30:09 PM By: Worthy Keeler PA-C Entered By: Worthy Keeler on 11/07/2018 15:30:09 Pracht, [name] ([id number]) -------------------------------------------------------------------------------- Progress Note Details Patient Name: [name] Date of Service: 11/07/2018 3:00 PM Medical Record Number: [id number] Patient Account Number: [id number] Date of Birth/Sex: 02/21/1966 (53 y.o. F) Treating RN: Harold Barban Primary Care Provider: Alean Rinne Other Clinician: Referring Provider: Alean Rinne Treating Provider/Extender: Melburn Hake, Elya Tarquinio Weeks in Treatment: 2 Subjective Chief Complaint Information obtained from Patient Left LE  Ulcers History of Present Illness (HPI) 10/24/2018 on evaluation today patient presents for initial evaluation or clinic concerning issues that she has been having with lymphedema for quite some time. Unfortunately she has several wound openings at this point that secondary to her lymphedema/venous stasis are giving her trouble and leaking quite severely. She also has diabetes along with hypertension and stage III chronic kidney disease. Fortunately there is no signs of active infection at this time. She is going to likely require debridement of the left leg ulcer upon evaluation today just based on what I am seeing. Fortunately there is no wound opening on the right. Overall the patient seems to be doing quite well and again there is no evidence of systemic infection which is good news. No fevers, chills, nausea, vomiting, or diarrhea. 10/31/2018 on evaluation today patient actually appears to be doing excellent in regard to her lower extremity ulcers. She has been tolerating the dressing changes without complication. Fortunately there is no signs of active infection at this time. She has tolerated 3 layer compression wrap without complication. 11/07/2018 upon evaluation today patient actually appears to be doing very well with regard to her left lower extremity ulcers. She has been tolerating the dressing changes without complication. Fortunately there is no signs of active infection. No fevers, chills, nausea, vomiting, or diarrhea. Patient History Information obtained from Patient. Family History Cancer - Father,Siblings, Diabetes - Mother,Siblings, Heart Disease - Mother,Maternal Grandparents, Hypertension - Mother,Maternal Grandparents, No family history of Hereditary Spherocytosis, Kidney Disease, Lung Disease, Seizures, Stroke, Thyroid Problems, Tuberculosis. Social History Never smoker, Marital Status - Divorced, Alcohol Use - Never, Drug Use - No History, Caffeine Use - Never. Medical  History Cardiovascular Patient has history of Congestive Heart Failure, Coronary Artery Disease - CABG 2010, Hypertension, Peripheral Venous Disease Denies history of Angina, Arrhythmia, Deep Vein Thrombosis, Hypotension, Myocardial Infarction, Peripheral Arterial Disease, Phlebitis, Vasculitis Endocrine Patient has history of Type II Diabetes Denies history of Type I Diabetes Genitourinary Patient has history of 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 Integumentary (Skin) [name], [name] ([id number]) Denies history of History of Burn, History of pressure wounds Neurologic Patient has history of Neuropathy Denies history of Dementia, Quadriplegia, Paraplegia, Seizure Disorder Medical And Surgical History Notes Cardiovascular HLD Review of Systems (ROS) Constitutional Symptoms (General Health) Denies complaints or symptoms of Fatigue, Fever, Chills, Marked Weight Change. Respiratory Denies complaints or symptoms of Chronic or frequent coughs, Shortness of Breath. Cardiovascular Complains or has symptoms of LE edema. Denies complaints or symptoms of Chest pain. Psychiatric Denies complaints or symptoms of Anxiety, Claustrophobia. Objective Constitutional Well-nourished and well-hydrated in no acute distress. Vitals Time Taken: 3:13 PM, Height: 67 in, Weight: 326 lbs, BMI: 51.1, Temperature: 99.2 F, Pulse: 88 bpm, Respiratory Rate: 18 breaths/min, Blood Pressure: 132/62 mmHg. Respiratory normal breathing without difficulty. Psychiatric this patient is able to make decisions and demonstrates good insight into disease process. Alert and Oriented x  3. pleasant and cooperative. General Notes: Patient's wound bed currently showed signs of good granulation at this time there was some slough noted on the proximal ulcer which I did actually perform sharp debridement on she tolerated this with only minimal discomfort post debridement wound bed appears to be doing much better which is  great news. The distal wound did not require any debridement at this time. Integumentary (Hair, Skin) Wound #1 status is Open. Original cause of wound was Gradually Appeared. The wound is located on the Left,Proximal,Lateral Lower Leg. The wound measures 0.4cm length x 0.6cm width x 0.1cm depth; 0.188cm^2 area and 0.019cm^3 volume. There is Fat Layer (Subcutaneous Tissue) Exposed exposed. There is no tunneling or undermining noted. There is a medium amount of serous drainage noted. The wound margin is flat and intact. There is large (67-100%) pink granulation within the wound bed. There is no necrotic tissue within the wound bed. Wound #2 status is Open. Original cause of wound was Gradually Appeared. The wound is located on the Left,Distal,Lateral Lower Leg. The wound measures 0.3cm length x 0.4cm width x 0.1cm depth; 0.094cm^2 area and 0.009cm^3 volume. There Yaun, Josseline ([id number]) is Fat Layer (Subcutaneous Tissue) Exposed exposed. There is no tunneling or undermining noted. There is a medium amount of serous drainage noted. The wound margin is flat and intact. There is large (67-100%) pink granulation within the wound bed. There is no necrotic tissue within the wound bed. Assessment Active Problems ICD-10 Lymphedema, not elsewhere classified Venous insufficiency (chronic) (peripheral) Non-pressure chronic ulcer of other part of left lower leg with fat layer exposed Type 2 diabetes mellitus with other skin ulcer Essential (primary) hypertension Chronic kidney disease, stage 3 (moderate) Procedures Wound #1 Pre-procedure diagnosis of Wound #1 is a Venous Leg Ulcer located on the Left,Proximal,Lateral Lower Leg .Severity of Tissue Pre Debridement is: Fat layer exposed. There was a Excisional Skin/Subcutaneous Tissue Debridement with a total area of 0.24 sq cm performed by STONE III, Tangelia Sanson E., PA-C. With the following instrument(s): Curette to remove Viable and Non-Viable  tissue/material. Material removed includes Subcutaneous Tissue and Slough and after achieving pain control using Lidocaine. No specimens were taken. A time out was conducted at 15:33, prior to the start of the procedure. A Minimum amount of bleeding was controlled with Pressure. The procedure was tolerated well with a pain level of 0 throughout and a pain level of 0 following the procedure. Post Debridement Measurements: 0.4cm length x 0.6cm width x 0.1cm depth; 0.019cm^3 volume. Character of Wound/Ulcer Post Debridement is improved. Severity of Tissue Post Debridement is: Fat layer exposed. Post procedure Diagnosis Wound #1: Same as Pre-Procedure Plan Wound Cleansing: Wound #1 Left,Proximal,Lateral Lower Leg: Cleanse wound with mild soap and water Wound #2 Left,Distal,Lateral Lower Leg: Cleanse wound with mild soap and water Primary Wound Dressing: Wound #1 Left,Proximal,Lateral Lower Leg: Silver Collagen Wound #2 Left,Distal,Lateral Lower Leg: Silver Collagen Secondary Dressing: Wound #1 Left,Proximal,Lateral Lower Leg: ABD pad [name], Kevina ([id number]) Wound #2 Left,Distal,Lateral Lower Leg: ABD pad Follow-up Appointments: Wound #1 Left,Proximal,Lateral Lower Leg: Return Appointment in 1 week. Wound #2 Left,Distal,Lateral Lower Leg: Return Appointment in 1 week. Edema Control: Wound #1 Left,Proximal,Lateral Lower Leg: 3 Layer Compression System - Left Lower Extremity Wound #2 Left,Distal,Lateral Lower Leg: 3 Layer Compression System - Left Lower Extremity Off-Loading: Wound #1 Left,Proximal,Lateral Lower Leg: Other: - Elevate leg when sitting Wound #2 Left,Distal,Lateral Lower Leg: Other: - Elevate leg when sitting 1. Patient's wound bed actually showed signs of great healing I feel  like she is progressing quite nicely and overall I am very pleased in this regard. 2. My suggestion likewise is also that we go ahead and continue with the 3 layer compression wrap since this  seems to be beneficial for her wound healing I think is doing a great job keeping her swelling under control. 3. I recommend she continue to elevate her legs as well as I think this also plays a good role in edema control. We will see patient back for reevaluation in 1 week here in the clinic. If anything worsens or changes patient will contact our office for additional recommendations. Electronic Signature(s) Signed: 11/07/2018 3:39:29 PM By: Worthy Keeler PA-C Entered By: Worthy Keeler on 11/07/2018 15:39:29 [name], [name] ([id number]) -------------------------------------------------------------------------------- ROS/PFSH Details Patient Name: [name] Date of Service: 11/07/2018 3:00 PM Medical Record Number: [id number] Patient Account Number: [id number] Date of Birth/Sex: 01/22/1966 (53 y.o. F) Treating RN: Harold Barban Primary Care Provider: Alean Rinne Other Clinician: Referring Provider: Alean Rinne Treating Provider/Extender: Melburn Hake, Hilmer Aliberti Weeks in Treatment: 2 Information Obtained From Patient Constitutional Symptoms (General Health) Complaints and Symptoms: Negative for: Fatigue; Fever; Chills; Marked Weight Change Respiratory Complaints and Symptoms: Negative for: Chronic or frequent coughs; Shortness of Breath Cardiovascular Complaints and Symptoms: Positive for: LE edema Negative for: Chest pain Medical History: Positive for: Congestive Heart Failure; Coronary Artery Disease - CABG 2010; Hypertension; Peripheral Venous Disease Negative for: Angina; Arrhythmia; Deep Vein Thrombosis; Hypotension; Myocardial Infarction; Peripheral Arterial Disease; Phlebitis; Vasculitis Past Medical History Notes: HLD Psychiatric Complaints and Symptoms: Negative for: Anxiety; Claustrophobia Endocrine Medical History: Positive for: Type II Diabetes Negative for: Type I Diabetes Time with diabetes: 1997 Treated with: Insulin Genitourinary Medical  History: Positive for: 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 Integumentary (Skin) Medical History: Negative for: History of Burn; History of pressure wounds [name], [name] ([id number]) Neurologic Medical History: Positive for: Neuropathy Negative for: Dementia; Quadriplegia; Paraplegia; Seizure Disorder Immunizations Pneumococcal Vaccine: Received Pneumococcal Vaccination: No Implantable Devices None Family and Social History Cancer: Yes - Father,Siblings; Diabetes: Yes - Mother,Siblings; Heart Disease: Yes - Mother,Maternal Grandparents; Hereditary Spherocytosis: No; Hypertension: Yes - Mother,Maternal Grandparents; Kidney Disease: No; Lung Disease: No; Seizures: No; Stroke: No; Thyroid Problems: No; Tuberculosis: No; Never smoker; Marital Status - Divorced; Alcohol Use: Never; Drug Use: No History; Caffeine Use: Never; Financial Concerns: No; Food, Clothing or Shelter Needs: No; Support System Lacking: No; Transportation Concerns: No Physician Affirmation I have reviewed and agree with the above information. Electronic Signature(s) Signed: 11/07/2018 4:31:36 PM By: Harold Barban Signed: 11/07/2018 5:18:58 PM By: Worthy Keeler PA-C Entered By: Worthy Keeler on 11/07/2018 15:38:39 Mckim, [name] ([id number]) -------------------------------------------------------------------------------- SuperBill Details Patient Name: [name] Date of Service: 11/07/2018 Medical Record Number: [id number] Patient Account Number: [id number] Date of Birth/Sex: 30-Jan-1966 (53 y.o. F) Treating RN: Harold Barban Primary Care Provider: Alean Rinne Other Clinician: Referring Provider: Alean Rinne Treating Provider/Extender: Melburn Hake, Velna Hedgecock Weeks in Treatment: 2 Diagnosis Coding ICD-10 Codes Code Description I89.0 Lymphedema, not elsewhere classified I87.2 Venous insufficiency (chronic) (peripheral) L97.822 Non-pressure chronic ulcer of other part of left lower leg with fat layer  exposed E11.622 Type 2 diabetes mellitus with other skin ulcer I10 Essential (primary) hypertension N18.3 Chronic kidney disease, stage 3 (moderate) Facility Procedures CPT4 Code Description: [id number] - DEB SUBQ TISSUE 20 SQ CM/< ICD-10 Diagnosis Description L97.822 Non-pressure chronic ulcer of other part of left lower leg with Modifier: fat layer expos Quantity: 1 ed Physician Procedures CPT4 Code Description: [id number] -  WC PHYS SUBQ TISS 20 SQ CM ICD-10 Diagnosis Description L97.822 Non-pressure chronic ulcer of other part of left lower leg with Modifier: fat layer expos Quantity: 1 ed Electronic Signature(s) Signed: 11/07/2018 3:40:31 PM By: Worthy Keeler PA-C Entered By: Worthy Keeler on 11/07/2018 15:40:30

## 2018-11-08 ENCOUNTER — Other Ambulatory Visit (HOSPITAL_COMMUNITY): Payer: Self-pay | Admitting: Cardiology

## 2018-11-09 NOTE — Progress Notes (Signed)
FARHA, DANO ([id number]) Visit Report for 11/07/2018 Arrival Information Details Patient Name: SUTTYN, CRYDER Date of Service: 11/07/2018 3:00 PM Medical Record Number: [id number] Patient Account Number: [id number] Date of Birth/Sex: 09-23-1965 (53 y.o. F) Treating RN: Harold Barban Primary Care Seith Aikey: Alean Rinne Other Clinician: Referring Raylyn Carton: Alean Rinne Treating Piotr Christopher/Extender: Melburn Hake, HOYT Weeks in Treatment: 2 Visit Information History Since Last Visit Added or deleted any medications: No Patient Arrived: Ambulatory Any new allergies or adverse reactions: No Arrival Time: 15:10 Had a fall or experienced change in No Accompanied By: self activities of daily living that may affect Transfer Assistance: None risk of falls: Patient Identification Verified: Yes Signs or symptoms of abuse/neglect since last visito No Secondary Verification Process Completed: Yes Hospitalized since last visit: No Implantable device outside of the clinic excluding No cellular tissue based products placed in the center since last visit: Has Dressing in Place as Prescribed: Yes Pain Present Now: No Electronic Signature(s) Signed: 11/07/2018 4:01:11 PM By: Lorine Bears RCP, RRT, CHT Entered By: Lorine Bears on 11/07/2018 15:12:52 Halterman, Folasade ([id number]) -------------------------------------------------------------------------------- Encounter Discharge Information Details Patient Name: [name] Date of Service: 11/07/2018 3:00 PM Medical Record Number: [id number] Patient Account Number: [id number] Date of Birth/Sex: 1965/06/23 (53 y.o. F) Treating RN: Army Melia Primary Care Marijayne Rauth: Alean Rinne Other Clinician: Referring Zeeshan Korte: Alean Rinne Treating Casilda Pickerill/Extender: Melburn Hake, HOYT Weeks in Treatment: 2 Encounter Discharge Information Items Post Procedure Vitals Discharge Condition: Stable Temperature (F): 99.2 Ambulatory  Status: Ambulatory Pulse (bpm): 88 Discharge Destination: Home Respiratory Rate (breaths/min): 16 Transportation: Private Auto Blood Pressure (mmHg): 132/62 Accompanied By: self Schedule Follow-up Appointment: Yes Clinical Summary of Care: Electronic Signature(s) Signed: 11/07/2018 4:14:14 PM By: Army Melia Entered By: Army Melia on 11/07/2018 15:51:26 Mcglothlin, Alieah ([id number]) -------------------------------------------------------------------------------- Lower Extremity Assessment Details Patient Name: [name] Date of Service: 11/07/2018 3:00 PM Medical Record Number: [id number] Patient Account Number: [id number] Date of Birth/Sex: [birth year] (53 y.o. F) Treating RN: Montey Hora Primary Care Kayin Kettering: Alean Rinne Other Clinician: Referring Sangita Zani: Alean Rinne Treating Doy Taaffe/Extender: STONE III, HOYT Weeks in Treatment: 2 Edema Assessment Assessed: [Left: No] [Right: No] Edema: [Left: Ye] [Right: s] Calf Left: Right: Point of Measurement: 32 cm From Medial Instep 44.6 cm cm Ankle Left: Right: Point of Measurement: 12 cm From Medial Instep 25 cm cm Vascular Assessment Pulses: Dorsalis Pedis Palpable: [Left:Yes] Electronic Signature(s) Signed: 11/08/2018 5:30:55 PM By: Montey Hora Entered By: Montey Hora on 11/07/2018 15:23:07 Keehan, [name] ([id number]) -------------------------------------------------------------------------------- Multi Wound Chart Details Patient Name: [name] Date of Service: 11/07/2018 3:00 PM Medical Record Number: [id number] Patient Account Number: [id number] Date of Birth/Sex: 1966/01/02 (53 y.o. F) Treating RN: Harold Barban Primary Care Lakoda Mcanany: Alean Rinne Other Clinician: Referring Valen Gillison: Alean Rinne Treating Dejanique Ruehl/Extender: STONE III, HOYT Weeks in Treatment: 2 Vital Signs Height(in): 67 Pulse(bpm): 88 Weight(lbs): 326 Blood Pressure(mmHg): 132/62 Body Mass Index(BMI): 51 Temperature(F):  99.2 Respiratory Rate 18 (breaths/min): Photos: [N/A:N/A] Wound Location: Left Lower Leg - Lateral, Left Lower Leg - Lateral, Distal N/A Proximal Wounding Event: Gradually Appeared Gradually Appeared N/A Primary Etiology: Venous Leg Ulcer Venous Leg Ulcer N/A Comorbid History: Congestive Heart Failure, Congestive Heart Failure, N/A Coronary Artery Disease, Coronary Artery Disease, Hypertension, Peripheral Hypertension, Peripheral Venous Disease, Type II Venous Disease, Type II Diabetes, End Stage Renal Diabetes, End Stage Renal Disease, Neuropathy Disease, Neuropathy Date Acquired: 10/03/2018 10/03/2018 N/A Weeks of Treatment: 2 2 N/A Wound Status: Open Open N/A Measurements L x W x D 0.4x0.6x0.1 0.3x0.4x0.1 N/A (cm) Area (cm) :  0.188 0.094 N/A Volume (cm) : 0.019 0.009 N/A % Reduction in Area: 27.40% 25.40% N/A % Reduction in Volume: 26.90% 30.80% N/A Classification: Full Thickness Without Full Thickness Without N/A Exposed Support Structures Exposed Support Structures Exudate Amount: Medium Medium N/A Exudate Type: Serous Serous N/A Exudate Color: amber amber N/A Wound Margin: Flat and Intact Flat and Intact N/A Granulation Amount: Large (67-100%) Large (67-100%) N/A Granulation Quality: Pink Pink N/A Necrotic Amount: None Present (0%) None Present (0%) N/A Exposed Structures: N/A Fassler, Merlean ([id number]) Fat Layer (Subcutaneous Fat Layer (Subcutaneous Tissue) Exposed: Yes Tissue) Exposed: Yes Fascia: No Fascia: No Tendon: No Tendon: No Muscle: No Muscle: No Joint: No Joint: No Bone: No Bone: No Epithelialization: Medium (34-66%) Large (67-100%) N/A Treatment Notes Electronic Signature(s) Signed: 11/07/2018 4:31:36 PM By: Harold Barban Entered By: Harold Barban on 11/07/2018 15:31:48 Stimson, [name] ([id number]) -------------------------------------------------------------------------------- Multi-Disciplinary Care Plan Details Patient Name: [name] Date of Service: 11/07/2018 3:00 PM Medical Record Number: [id number] Patient Account Number: [id number] Date of Birth/Sex: [birth year] (53 y.o. F) Treating RN: Harold Barban Primary Care Brookie Wayment: Alean Rinne Other Clinician: Referring Shakiera Edelson: Alean Rinne Treating Shaneequa Bahner/Extender: Melburn Hake, HOYT Weeks in Treatment: 2 Active Inactive Venous Leg Ulcer Nursing Diagnoses: Knowledge deficit related to disease process and management Goals: Patient/caregiver will verbalize understanding of disease process and disease management Date Initiated: 10/24/2018 Target Resolution Date: 11/24/2018 Goal Status: Active Interventions: Assess peripheral edema status every visit. Notes: Wound/Skin Impairment Nursing Diagnoses: Impaired tissue integrity Knowledge deficit related to ulceration/compromised skin integrity Goals: Ulcer/skin breakdown will have a volume reduction of 30% by week 4 Date Initiated: 10/24/2018 Target Resolution Date: 11/24/2018 Goal Status: Active Interventions: Assess patient/caregiver ability to obtain necessary supplies Assess patient/caregiver ability to perform ulcer/skin care regimen upon admission and as needed Assess ulceration(s) every visit Provide education on ulcer and skin care Notes: Electronic Signature(s) Signed: 11/07/2018 4:31:36 PM By: Harold Barban Entered By: Harold Barban on 11/07/2018 15:31:34 Deziel, Krithi ([id number]) -------------------------------------------------------------------------------- Pain Assessment Details Patient Name: [name] Date of Service: 11/07/2018 3:00 PM Medical Record Number: [id number] Patient Account Number: [id number] Date of Birth/Sex: 1966/02/13 (53 y.o. F) Treating RN: Harold Barban Primary Care Kady Toothaker: Alean Rinne Other Clinician: Referring Orland Visconti: Alean Rinne Treating Isadore Palecek/Extender: Melburn Hake, HOYT Weeks in Treatment: 2 Active Problems Location of Pain Severity and Description of  Pain Patient Has Paino No Site Locations Pain Management and Medication Current Pain Management: Electronic Signature(s) Signed: 11/07/2018 4:01:11 PM By: Lorine Bears RCP, RRT, CHT Signed: 11/07/2018 4:31:36 PM By: Harold Barban Entered By: Lorine Bears on 11/07/2018 15:13:01 Kassebaum, [name] ([id number]) -------------------------------------------------------------------------------- Patient/Caregiver Education Details Patient Name: [name] Date of Service: 11/07/2018 3:00 PM Medical Record Number: [id number] Patient Account Number: [id number] Date of Birth/Gender: 01/07/1966 (53 y.o. F) Treating RN: Harold Barban Primary Care Physician: Alean Rinne Other Clinician: Referring Physician: Alean Rinne Treating Physician/Extender: Sharalyn Ink in Treatment: 2 Education Assessment Education Provided To: Patient Education Topics Provided Wound/Skin Impairment: Handouts: Caring for Your Ulcer Methods: Demonstration, Explain/Verbal Responses: State content correctly Electronic Signature(s) Signed: 11/07/2018 4:31:36 PM By: Harold Barban Entered By: Harold Barban on 11/07/2018 15:32:07 Morford, Juanitta ([id number]) -------------------------------------------------------------------------------- Wound Assessment Details Patient Name: [name] Date of Service: 11/07/2018 3:00 PM Medical Record Number: [id number] Patient Account Number: [id number] Date of Birth/Sex: [birth year] (53 y.o. F) Treating RN: Montey Hora Primary Care Nickisha Hum: Alean Rinne Other Clinician: Referring Zylie Mumaw: Alean Rinne Treating Thaila Bottoms/Extender: STONE III, HOYT Weeks in Treatment: 2 Wound Status Wound Number: 1 Primary Venous Leg Ulcer  Etiology: Wound Location: Left Lower Leg - Lateral, Proximal Wound Open Wounding Event: Gradually Appeared Status: Date Acquired: 10/03/2018 Comorbid Congestive Heart Failure, Coronary Artery Weeks Of  Treatment: 2 History: Disease, Hypertension, Peripheral Venous Clustered Wound: No Disease, Type II Diabetes, End Stage Renal Disease, Neuropathy Photos Wound Measurements Length: (cm) 0.4 Width: (cm) 0.6 Depth: (cm) 0.1 Area: (cm) 0.188 Volume: (cm) 0.019 % Reduction in Area: 27.4% % Reduction in Volume: 26.9% Epithelialization: Medium (34-66%) Tunneling: No Undermining: No Wound Description Full Thickness Without Exposed Support Classification: Structures Wound Margin: Flat and Intact Exudate Medium Amount: Exudate Type: Serous Exudate Color: amber Foul Odor After Cleansing: No Slough/Fibrino No Wound Bed Granulation Amount: Large (67-100%) Exposed Structure Granulation Quality: Pink Fascia Exposed: No Necrotic Amount: None Present (0%) Fat Layer (Subcutaneous Tissue) Exposed: Yes Tendon Exposed: No Muscle Exposed: No Joint Exposed: No Bone Exposed: No Lapier, Aleasha ([id number]) Treatment Notes Wound #1 (Left, Proximal, Lateral Lower Leg) Notes collagen, ABD, 3 layer Electronic Signature(s) Signed: 11/08/2018 5:30:55 PM By: Montey Hora Entered By: Montey Hora on 11/07/2018 15:22:11 Beier, Taleeya ([id number]) -------------------------------------------------------------------------------- Wound Assessment Details Patient Name: [name] Date of Service: 11/07/2018 3:00 PM Medical Record Number: [id number] Patient Account Number: [id number] Date of Birth/Sex: 1965/09/08 (52 y.o. F) Treating RN: Montey Hora Primary Care Ocia Simek: Alean Rinne Other Clinician: Referring Zarai Orsborn: Alean Rinne Treating Yonas Bunda/Extender: Melburn Hake, HOYT Weeks in Treatment: 2 Wound Status Wound Number: 2 Primary Venous Leg Ulcer Etiology: Wound Location: Left Lower Leg - Lateral, Distal Wound Open Wounding Event: Gradually Appeared Status: Date Acquired: 10/03/2018 Comorbid Congestive Heart Failure, Coronary Artery Weeks Of Treatment: 2 History: Disease,  Hypertension, Peripheral Venous Clustered Wound: No Disease, Type II Diabetes, End Stage Renal Disease, Neuropathy Photos Wound Measurements Length: (cm) 0.3 Width: (cm) 0.4 Depth: (cm) 0.1 Area: (cm) 0.094 Volume: (cm) 0.009 % Reduction in Area: 25.4% % Reduction in Volume: 30.8% Epithelialization: Large (67-100%) Tunneling: No Undermining: No Wound Description Full Thickness Without Exposed Support Classification: Structures Wound Margin: Flat and Intact Exudate Medium Amount: Exudate Type: Serous Exudate Color: amber Foul Odor After Cleansing: No Slough/Fibrino No Wound Bed Granulation Amount: Large (67-100%) Exposed Structure Granulation Quality: Pink Fascia Exposed: No Necrotic Amount: None Present (0%) Fat Layer (Subcutaneous Tissue) Exposed: Yes Tendon Exposed: No Muscle Exposed: No Joint Exposed: No Bone Exposed: No Fluty, Caleb ([id number]) Treatment Notes Wound #2 (Left, Distal, Lateral Lower Leg) Notes collagen, ABD, 3 layer Electronic Signature(s) Signed: 11/08/2018 5:30:55 PM By: Montey Hora Entered By: Montey Hora on 11/07/2018 15:22:40 Gettis, [name] ([id number]) -------------------------------------------------------------------------------- [place] Details Patient Name: [name] Date of Service: 11/07/2018 3:00 PM Medical Record Number: [id number] Patient Account Number: [id number] Date of Birth/Sex: October 04, 1965 (53 y.o. F) Treating RN: Harold Barban Primary Care Zafira Munos: Alean Rinne Other Clinician: Referring Jonthan Leite: Alean Rinne Treating Zoeie Ritter/Extender: Melburn Hake, HOYT Weeks in Treatment: 2 Vital Signs Time Taken: 15:13 Temperature (F): 99.2 Height (in): 67 Pulse (bpm): 88 Weight (lbs): 326 Respiratory Rate (breaths/min): 18 Body Mass Index (BMI): 51.1 Blood Pressure (mmHg): 132/62 Reference Range: 80 - 120 mg / dl Electronic Signature(s) Signed: 11/07/2018 4:01:11 PM By: Lorine Bears RCP, RRT,  CHT Entered By: Lorine Bears on 11/07/2018 15:16:56

## 2018-11-14 ENCOUNTER — Telehealth (HOSPITAL_COMMUNITY): Payer: Self-pay

## 2018-11-14 ENCOUNTER — Ambulatory Visit (HOSPITAL_COMMUNITY)
Admission: RE | Admit: 2018-11-14 | Discharge: 2018-11-14 | Disposition: A | Discharge status: Home / Self Care | Payer: Medicare Other | Source: Ambulatory Visit | Attending: Cardiology | Admitting: Cardiology

## 2018-11-14 ENCOUNTER — Encounter (HOSPITAL_COMMUNITY): Payer: Self-pay | Admitting: Cardiology

## 2018-11-14 ENCOUNTER — Other Ambulatory Visit: Payer: Self-pay

## 2018-11-14 VITALS — BP 141/48 | HR 88 | Wt 322.2 lb

## 2018-11-14 DIAGNOSIS — I25810 Atherosclerosis of coronary artery bypass graft(s) without angina pectoris: Secondary | ICD-10-CM | POA: Diagnosis not present

## 2018-11-14 DIAGNOSIS — I5032 Chronic diastolic (congestive) heart failure: Secondary | ICD-10-CM

## 2018-11-14 DIAGNOSIS — M109 Gout, unspecified: Secondary | ICD-10-CM | POA: Diagnosis not present

## 2018-11-14 DIAGNOSIS — I130 Hypertensive heart and chronic kidney disease with heart failure and stage 1 through stage 4 chronic kidney disease, or unspecified chronic kidney disease: Secondary | ICD-10-CM | POA: Diagnosis not present

## 2018-11-14 DIAGNOSIS — Z7982 Long term (current) use of aspirin: Secondary | ICD-10-CM | POA: Diagnosis not present

## 2018-11-14 DIAGNOSIS — E7849 Other hyperlipidemia: Secondary | ICD-10-CM | POA: Diagnosis not present

## 2018-11-14 DIAGNOSIS — I4510 Unspecified right bundle-branch block: Secondary | ICD-10-CM | POA: Insufficient documentation

## 2018-11-14 DIAGNOSIS — Z955 Presence of coronary angioplasty implant and graft: Secondary | ICD-10-CM | POA: Insufficient documentation

## 2018-11-14 DIAGNOSIS — R9431 Abnormal electrocardiogram [ECG] [EKG]: Secondary | ICD-10-CM | POA: Insufficient documentation

## 2018-11-14 DIAGNOSIS — N183 Chronic kidney disease, stage 3 (moderate): Secondary | ICD-10-CM | POA: Insufficient documentation

## 2018-11-14 DIAGNOSIS — Z888 Allergy status to other drugs, medicaments and biological substances status: Secondary | ICD-10-CM | POA: Insufficient documentation

## 2018-11-14 DIAGNOSIS — E785 Hyperlipidemia, unspecified: Secondary | ICD-10-CM | POA: Diagnosis not present

## 2018-11-14 DIAGNOSIS — E1122 Type 2 diabetes mellitus with diabetic chronic kidney disease: Secondary | ICD-10-CM | POA: Insufficient documentation

## 2018-11-14 DIAGNOSIS — Z79899 Other long term (current) drug therapy: Secondary | ICD-10-CM | POA: Diagnosis not present

## 2018-11-14 DIAGNOSIS — Z951 Presence of aortocoronary bypass graft: Secondary | ICD-10-CM | POA: Diagnosis not present

## 2018-11-14 DIAGNOSIS — Z886 Allergy status to analgesic agent status: Secondary | ICD-10-CM | POA: Diagnosis not present

## 2018-11-14 DIAGNOSIS — E11621 Type 2 diabetes mellitus with foot ulcer: Secondary | ICD-10-CM | POA: Diagnosis not present

## 2018-11-14 DIAGNOSIS — Z8249 Family history of ischemic heart disease and other diseases of the circulatory system: Secondary | ICD-10-CM | POA: Insufficient documentation

## 2018-11-14 DIAGNOSIS — Z794 Long term (current) use of insulin: Secondary | ICD-10-CM | POA: Insufficient documentation

## 2018-11-14 DIAGNOSIS — I5022 Chronic systolic (congestive) heart failure: Secondary | ICD-10-CM

## 2018-11-14 DIAGNOSIS — Z7902 Long term (current) use of antithrombotics/antiplatelets: Secondary | ICD-10-CM | POA: Diagnosis not present

## 2018-11-14 DIAGNOSIS — I2510 Atherosclerotic heart disease of native coronary artery without angina pectoris: Secondary | ICD-10-CM | POA: Diagnosis present

## 2018-11-14 DIAGNOSIS — Z881 Allergy status to other antibiotic agents status: Secondary | ICD-10-CM | POA: Diagnosis not present

## 2018-11-14 DIAGNOSIS — I872 Venous insufficiency (chronic) (peripheral): Secondary | ICD-10-CM | POA: Diagnosis not present

## 2018-11-14 DIAGNOSIS — I252 Old myocardial infarction: Secondary | ICD-10-CM | POA: Diagnosis not present

## 2018-11-14 DIAGNOSIS — Z91040 Latex allergy status: Secondary | ICD-10-CM | POA: Diagnosis not present

## 2018-11-14 DIAGNOSIS — I70213 Atherosclerosis of native arteries of extremities with intermittent claudication, bilateral legs: Secondary | ICD-10-CM | POA: Diagnosis not present

## 2018-11-14 DIAGNOSIS — Z6841 Body Mass Index (BMI) 40.0 and over, adult: Secondary | ICD-10-CM | POA: Insufficient documentation

## 2018-11-14 DIAGNOSIS — F329 Major depressive disorder, single episode, unspecified: Secondary | ICD-10-CM | POA: Insufficient documentation

## 2018-11-14 LAB — BASIC METABOLIC PANEL
Anion gap: 13 (ref 5–15)
BUN: 71 mg/dL — ABNORMAL HIGH (ref 6–20)
CO2: 25 mmol/L (ref 22–32)
Calcium: 9.5 mg/dL (ref 8.9–10.3)
Chloride: 100 mmol/L (ref 98–111)
Creatinine, Ser: 3.05 mg/dL — ABNORMAL HIGH (ref 0.44–1.00)
GFR calc Af Amer: 19 mL/min — ABNORMAL LOW (ref >60)
GFR calc non Af Amer: 17 mL/min — ABNORMAL LOW (ref >60)
Glucose, Bld: 236 mg/dL — ABNORMAL HIGH (ref 70–99)
Potassium: 3.9 mmol/L (ref 3.5–5.1)
Sodium: 138 mmol/L (ref 135–145)

## 2018-11-14 LAB — CBC
HCT: 31.6 % — ABNORMAL LOW (ref 36.0–46.0)
Hemoglobin: 10.1 g/dL — ABNORMAL LOW (ref 12.0–15.0)
MCH: 26.8 pg (ref 26.0–34.0)
MCHC: 32 g/dL (ref 30.0–36.0)
MCV: 83.8 fL (ref 80.0–100.0)
Platelets: 238 10*3/uL (ref 150–400)
RBC: 3.77 MIL/uL — ABNORMAL LOW (ref 3.87–5.11)
RDW: 13.2 % (ref 11.5–15.5)
WBC: 6.2 10*3/uL (ref 4.0–10.5)
nRBC: 0 % (ref 0.0–0.2)

## 2018-11-14 LAB — LIPID PANEL
Cholesterol: 105 mg/dL (ref 0–200)
HDL: 23 mg/dL — ABNORMAL LOW (ref >40)
LDL Cholesterol: 42 mg/dL (ref 0–99)
Total CHOL/HDL Ratio: 4.6 RATIO
Triglycerides: 202 mg/dL — ABNORMAL HIGH (ref <150)
VLDL: 40 mg/dL (ref 0–40)

## 2018-11-14 MED ORDER — TORSEMIDE 20 MG PO TABS: 40.0000 mg | ORAL_TABLET | Freq: Two times a day (BID) | ORAL | 5 refills | Status: DC

## 2018-11-14 NOTE — Patient Instructions (Signed)
NO medication changes today.  Labs today We will only contact you if something comes back abnormal or we need to make some changes. Otherwise no news is good news!  Your physician recommends that you schedule a follow-up appointment in: 3 months with Dr Aundra Dubin  At the [facility], you and your health needs are our priority. As part of our continuing mission to provide you with exceptional heart care, we have created designated Provider Care Teams. These Care Teams include your primary Cardiologist (physician) and Advanced Practice Providers (APPs- Physician Assistants and Nurse Practitioners) who all work together to provide you with the care you need, when you need it.   You may see any of the following providers on your designated Care Team at your next follow up: Marland Kitchen Dr Glori Bickers . Dr Loralie Champagne . Darrick Grinder, NP   Please be sure to bring in all your medications bottles to every appointment.

## 2018-11-14 NOTE — Telephone Encounter (Signed)
Pt aware of lab results and med recommendations. Verbalized understanding. Will repeat lab work in 10 days.

## 2018-11-14 NOTE — Telephone Encounter (Signed)
-----   Message from Larey Dresser, MD sent at 11/14/2018  4:23 PM EDT ----- Lipids acceptable, but BUN/creatinine higher.  Hold torsemide for 2 days, then decrease torsemide to 40 mg bid. BMET again in 10 days.

## 2018-11-15 ENCOUNTER — Ambulatory Visit: Payer: Medicare Other | Admitting: Physician Assistant

## 2018-11-15 MED ORDER — TORSEMIDE 20 MG PO TABS: 40.0000 mg | ORAL_TABLET | Freq: Two times a day (BID) | ORAL | 5 refills | Status: DC
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
## 2018-11-15 NOTE — Addendum Note (Signed)
Addended by: Valeda Malm on: 11/15/2018 12:04 PM   Modules accepted: Orders

## 2018-11-18 ENCOUNTER — Ambulatory Visit: Payer: Medicare Other | Admitting: Physician Assistant

## 2018-11-21 ENCOUNTER — Encounter: Payer: Medicare Other | Admitting: Physician Assistant

## 2018-11-21 ENCOUNTER — Other Ambulatory Visit: Payer: Self-pay

## 2018-11-21 DIAGNOSIS — E11621 Type 2 diabetes mellitus with foot ulcer: Secondary | ICD-10-CM | POA: Diagnosis not present

## 2018-11-21 NOTE — Progress Notes (Addendum)
[name], [name] ([id number]) Visit Report for 11/21/2018 Arrival Information Details Patient Name: [name], [name] Date of Service: 11/21/2018 8:45 AM Medical Record Number: [id number] Patient Account Number: [id number] Date of Birth/Sex: January 14, 1966 (53 y.o. F) Treating RN: Harold Barban Primary Care Richar Dunklee: Alean Rinne Other Clinician: Referring Darryl Willner: Alean Rinne Treating Shabreka Coulon/Extender: Melburn Hake, HOYT Weeks in Treatment: 4 Visit Information History Since Last Visit Added or deleted any medications: No Patient Arrived: Ambulatory Any new allergies or adverse reactions: No Arrival Time: 08:57 Had a fall or experienced change in No Accompanied By: self activities of daily living that may affect Transfer Assistance: None risk of falls: Patient Identification Verified: Yes Signs or symptoms of abuse/neglect since last visito No Secondary Verification Process Completed: Yes Hospitalized since last visit: No Implantable device outside of the clinic excluding No cellular tissue based products placed in the center since last visit: Has Dressing in Place as Prescribed: Yes Has Compression in Place as Prescribed: Yes Pain Present Now: Yes Electronic Signature(s) Signed: 11/21/2018 12:04:21 PM By: Lorine Bears RCP, RRT, CHT Entered By: Lorine Bears on 11/21/2018 08:58:41 Stockard, Arlenne ([id number]) -------------------------------------------------------------------------------- Clinic Level of Care Assessment Details Patient Name: [name] Date of Service: 11/21/2018 8:45 AM Medical Record Number: [id number] Patient Account Number: [id number] Date of Birth/Sex: [birth year] (53 y.o. F) Treating RN: Harold Barban Primary Care Yordin Rhoda: Alean Rinne Other Clinician: Referring Cleotilde Spadaccini: Alean Rinne Treating Sonali Wivell/Extender: Melburn Hake, HOYT Weeks in Treatment: 4 Clinic Level of Care Assessment Items TOOL 4 Quantity Score []  - Use when  only an EandM is performed on FOLLOW-UP visit 0 ASSESSMENTS - Nursing Assessment / Reassessment X - Reassessment of Co-morbidities (includes updates in patient status) 1 10 X- 1 5 Reassessment of Adherence to Treatment Plan ASSESSMENTS - Wound and Skin Assessment / Reassessment []  - Simple Wound Assessment / Reassessment - one wound 0 X- 2 5 Complex Wound Assessment / Reassessment - multiple wounds []  - 0 Dermatologic / Skin Assessment (not related to wound area) ASSESSMENTS - Focused Assessment []  - Circumferential Edema Measurements - multi extremities 0 []  - 0 Nutritional Assessment / Counseling / Intervention []  - 0 Lower Extremity Assessment (monofilament, tuning fork, pulses) []  - 0 Peripheral Arterial Disease Assessment (using hand held doppler) ASSESSMENTS - Ostomy and/or Continence Assessment and Care []  - Incontinence Assessment and Management 0 []  - 0 Ostomy Care Assessment and Management (repouching, etc.) PROCESS - Coordination of Care X - Simple Patient / Family Education for ongoing care 1 15 []  - 0 Complex (extensive) Patient / Family Education for ongoing care []  - 0 Staff obtains Programmer, systems, Records, Test Results / Process Orders []  - 0 Staff telephones HHA, Nursing Homes / Clarify orders / etc []  - 0 Routine Transfer to another Facility (non-emergent condition) []  - 0 Routine Hospital Admission (non-emergent condition) []  - 0 New Admissions / Biomedical engineer / Ordering NPWT, Apligraf, etc. []  - 0 Emergency Hospital Admission (emergent condition) X- 1 10 Simple Discharge Coordination Clauson, Alleen ([id number]) []  - 0 Complex (extensive) Discharge Coordination PROCESS - Special Needs []  - Pediatric / Minor Patient Management 0 []  - 0 Isolation Patient Management []  - 0 Hearing / Language / Visual special needs []  - 0 Assessment of Community assistance (transportation, D/C planning, etc.) []  - 0 Additional assistance / Altered mentation []   - 0 Support Surface(s) Assessment (bed, cushion, seat, etc.) INTERVENTIONS - Wound Cleansing / Measurement []  - Simple Wound Cleansing - one wound 0 X- 2 5 Complex Wound Cleansing - multiple wounds  X- 1 5 Wound Imaging (photographs - any number of wounds) []  - 0 Wound Tracing (instead of photographs) []  - 0 Simple Wound Measurement - one wound X- 2 5 Complex Wound Measurement - multiple wounds INTERVENTIONS - Wound Dressings X - Small Wound Dressing one or multiple wounds 2 10 []  - 0 Medium Wound Dressing one or multiple wounds []  - 0 Large Wound Dressing one or multiple wounds []  - 0 Application of Medications - topical []  - 0 Application of Medications - injection INTERVENTIONS - Miscellaneous []  - External ear exam 0 []  - 0 Specimen Collection (cultures, biopsies, blood, body fluids, etc.) []  - 0 Specimen(s) / Culture(s) sent or taken to Lab for analysis []  - 0 Patient Transfer (multiple staff / Civil Service fast streamer / Similar devices) []  - 0 Simple Staple / Suture removal (25 or less) []  - 0 Complex Staple / Suture removal (26 or more) []  - 0 Hypo / Hyperglycemic Management (close monitor of Blood Glucose) []  - 0 Ankle / Brachial Index (ABI) - do not check if billed separately X- 1 5 Vital Signs Nikkel, Parveen ([id number]) Has the patient been seen at the hospital within the last three years: Yes Total Score: 100 Level Of Care: New/Established - Level 3 Electronic Signature(s) Signed: 11/21/2018 4:05:51 PM By: Harold Barban Entered By: Harold Barban on 11/21/2018 09:23:21 Fray, [name] ([id number]) -------------------------------------------------------------------------------- Encounter Discharge Information Details Patient Name: [name] Date of Service: 11/21/2018 8:45 AM Medical Record Number: [id number] Patient Account Number: [id number] Date of Birth/Sex: 08-28-1965 (53 y.o. F) Treating RN: Army Melia Primary Care Briyana Badman: Alean Rinne Other  Clinician: Referring Cleora Karnik: Alean Rinne Treating Dagan Heinz/Extender: Melburn Hake, HOYT Weeks in Treatment: 4 Encounter Discharge Information Items Discharge Condition: Stable Ambulatory Status: Cane Discharge Destination: Home Transportation: Private Auto Accompanied By: daughter Schedule Follow-up Appointment: Yes Clinical Summary of Care: Electronic Signature(s) Signed: 11/21/2018 11:48:47 AM By: Army Melia Entered By: Army Melia on 11/21/2018 10:17:11 Garczynski, [name] ([id number]) -------------------------------------------------------------------------------- Lower Extremity Assessment Details Patient Name: [name] Date of Service: 11/21/2018 8:45 AM Medical Record Number: [id number] Patient Account Number: [id number] Date of Birth/Sex: 06-12-1965 (53 y.o. F) Treating RN: Montey Hora Primary Care Atha Muradyan: Alean Rinne Other Clinician: Referring Kyna Blahnik: Alean Rinne Treating Sofia Vanmeter/Extender: STONE III, HOYT Weeks in Treatment: 4 Edema Assessment Assessed: [Left: No] [Right: No] [Left: Edema] [Right: :] Calf Left: Right: Point of Measurement: 32 cm From Medial Instep 44.5 cm cm Ankle Left: Right: Point of Measurement: 12 cm From Medial Instep 24 cm cm Vascular Assessment Pulses: Dorsalis Pedis Palpable: [Left:Yes] Electronic Signature(s) Signed: 11/21/2018 3:47:55 PM By: Montey Hora Entered By: Montey Hora on 11/21/2018 09:09:47 Dudding, Rhylen ([id number]) -------------------------------------------------------------------------------- Multi Wound Chart Details Patient Name: [name] Date of Service: 11/21/2018 8:45 AM Medical Record Number: [id number] Patient Account Number: [id number] Date of Birth/Sex: 1965-07-16 (53 y.o. 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 Comorbid History: Congestive Heart Failure, Congestive Heart Failure, Congestive Heart Failure, Coronary Artery Disease, Coronary Artery Disease, Coronary Artery Disease, Hypertension, Peripheral Hypertension, Peripheral Hypertension, Peripheral Venous Disease,  Type II Venous Disease, Type II Venous Disease, Type II Diabetes, End Stage Renal Diabetes, End Stage Renal Diabetes, End Stage Renal Disease, Neuropathy Disease, Neuropathy Disease, Neuropathy Date Acquired: 10/03/2018 10/03/2018 11/21/2018 Weeks of Treatment: 4 4 0 Wound Status: Open Healed - Epithelialized Open Measurements L x W x D 0.4x0.5x0.1 0x0x0 0.3x0.4x0.1 (cm) Area (cm) : 0.157 0 0.094 Volume (cm) : 0.016 0 0.009 % Reduction in Area: 39.40% 100.00% N/A % Reduction in Volume: 38.50% 100.00% N/A Classification: Full Thickness Without Full Thickness Without Grade 1 Exposed Support Structures Exposed Support Structures Exudate Amount: Medium None Present Small Exudate Type: Serous N/A Serous Exudate Color: amber N/A amber Wound Margin: Flat and Intact N/A Flat and Intact Granulation Amount: Large (67-100%) None Present (0%) None Present (0%) Granulation Quality: Pink N/A N/A Necrotic Amount: None Present (0%) None Present (0%) None Present (0%) Stonerock, Draven ([id number]) Exposed Structures: Fat Layer (Subcutaneous Fascia: No Fascia: No Tissue) Exposed: Yes Fat Layer (Subcutaneous Fat Layer (Subcutaneous Fascia: No Tissue) Exposed: No Tissue) Exposed: No Tendon: No Tendon:  No Tendon: No Muscle: No Muscle: No Muscle: No Joint: No Joint: No Joint: No Bone: No Bone: No Bone: No Limited to Skin Breakdown Limited to Skin Breakdown Epithelialization: Medium (34-66%) Large (67-100%) Large (67-100%) Treatment Notes Electronic Signature(s) Signed: 11/21/2018 4:05:51 PM By: Harold Barban Entered By: Harold Barban on 11/21/2018 09:20:20 Azam, [name] ([id number]) -------------------------------------------------------------------------------- Multi-Disciplinary Care Plan Details Patient Name: [name] Date of Service: 11/21/2018 8:45 AM Medical Record Number: [id number] Patient Account Number: [id number] Date of Birth/Sex: March 30, 1965 (53 y.o. F) Treating RN: Harold Barban Primary Care Clariza Sickman: Alean Rinne Other Clinician: Referring Danice Dippolito: Alean Rinne Treating Secundino Ellithorpe/Extender: Melburn Hake, HOYT Weeks in Treatment: 4 Active Inactive Venous Leg Ulcer Nursing Diagnoses: Knowledge deficit related to disease process and management Goals: Patient/caregiver will verbalize understanding of disease process and disease management Date Initiated: 10/24/2018 Target Resolution Date: 11/24/2018 Goal Status: Active Interventions: Assess peripheral edema status every visit. Notes: Wound/Skin Impairment Nursing Diagnoses: Impaired tissue integrity Knowledge deficit related to ulceration/compromised skin integrity Goals: Ulcer/skin breakdown will have a volume reduction of 30% by week 4 Date Initiated: 10/24/2018 Target Resolution Date: 11/24/2018 Goal Status: Active Interventions: Assess patient/caregiver ability to obtain necessary supplies Assess patient/caregiver ability to perform ulcer/skin care regimen upon admission and as needed Assess ulceration(s) every visit Provide education on ulcer and skin care Notes: Electronic Signature(s) Signed: 11/21/2018 4:05:51 PM By: Harold Barban Entered By: Harold Barban on 11/21/2018 09:20:09 [place],  [name] ([id number]) -------------------------------------------------------------------------------- Pain Assessment Details Patient Name: [name] Date of Service: 11/21/2018 8:45 AM Medical Record Number: [id number] Patient Account Number: [id number] Date of Birth/Sex: 04/04/1965 (53 y.o. F) Treating RN: Harold Barban Primary Care Prescott Truex: Alean Rinne Other Clinician: Referring Jensyn Shave: Alean Rinne Treating Florabelle Cardin/Extender: Melburn Hake, HOYT Weeks in Treatment: 4 Active Problems Location of Pain Severity and Description of Pain Patient Has Paino Yes Site Locations Rate the pain. Current Pain Level: 3 Pain Management and Medication Current Pain Management: Electronic Signature(s) Signed: 11/21/2018 12:04:21 PM By: Lorine Bears RCP, RRT, CHT Signed: 11/21/2018 4:05:51 PM By: Harold Barban Entered By: Lorine Bears on 11/21/2018 08:58:58 Zink, [name] ([id number]) -------------------------------------------------------------------------------- Patient/Caregiver Education Details Patient Name: [name] Date of Service: 11/21/2018 8:45 AM Medical Record Number: [id number] Patient Account Number: [id number] Date of Birth/Gender: 11/23/1965 (53 y.o. F) Treating RN: Harold Barban Primary Care Physician: Alean Rinne Other Clinician: Referring Physician: Alean Rinne Treating Physician/Extender: Sharalyn Ink in Treatment: 4 Education Assessment Education Provided To: Patient Education  Topics Provided Wound/Skin Impairment: Handouts: Caring for Your Ulcer Methods: Demonstration, Explain/Verbal Responses: State content correctly Electronic Signature(s) Signed: 11/21/2018 4:05:51 PM By: Harold Barban Entered By: Harold Barban on 11/21/2018 09:20:47 Lumsden, Bernardina ([id number]) -------------------------------------------------------------------------------- Wound Assessment Details Patient Name: [name] Date of  Service: 11/21/2018 8:45 AM Medical Record Number: [id number] Patient Account Number: [id number] Date of Birth/Sex: 27-Mar-1965 (53 y.o. F) Treating RN: Montey Hora Primary Care Enzio Buchler: Alean Rinne Other Clinician: Referring Dominick Morella: Alean Rinne Treating Marcelene Weidemann/Extender: Melburn Hake, HOYT Weeks in Treatment: 4 Wound Status Wound Number: 1 Primary Venous Leg Ulcer Etiology: Wound Location: Left Lower Leg - Lateral, Proximal Wound Open Wounding Event: Gradually Appeared Status: Date Acquired: 10/03/2018 Comorbid Congestive Heart Failure, Coronary Artery Weeks Of Treatment: 4 History: Disease, Hypertension, Peripheral Venous Clustered Wound: No Disease, Type II Diabetes, End Stage Renal Disease, Neuropathy Photos Wound Measurements Length: (cm) 0.4 Width: (cm) 0.5 Depth: (cm) 0.1 Area: (cm) 0.157 Volume: (cm) 0.016 % Reduction in Area: 39.4% % Reduction in Volume: 38.5% Epithelialization: Medium (34-66%) Tunneling: No Undermining: No Wound Description Full Thickness Without Exposed Support Classification: Structures Wound Margin: Flat and Intact Exudate Medium Amount: Exudate Type: Serous Exudate Color: amber Foul Odor After Cleansing: No Slough/Fibrino No Wound Bed Granulation Amount: Large (67-100%) Exposed Structure Granulation Quality: Pink Fascia Exposed: No Necrotic Amount: None Present (0%) Fat Layer (Subcutaneous Tissue) Exposed: Yes Tendon Exposed: No Muscle Exposed: No Joint Exposed: No Bone Exposed: No Galas, Modena ([id number]) Treatment Notes Wound #1 (Left, Proximal, Lateral Lower Leg) Notes silver collagen, ABD, 3 layer Electronic Signature(s) Signed: 11/21/2018 3:47:55 PM By: Montey Hora Entered By: Montey Hora on 11/21/2018 09:07:26 Hottel, Anabel ([id number]) -------------------------------------------------------------------------------- Wound Assessment Details Patient Name: [name] Date of Service: 11/21/2018 8:45  AM Medical Record Number: [id number] Patient Account Number: [id number] Date of Birth/Sex: December 02, 1965 (53 y.o. F) Treating RN: Montey Hora Primary Care Genavive Kubicki: Alean Rinne Other Clinician: Referring Temple Ewart: Alean Rinne Treating Chloey Ricard/Extender: Melburn Hake, HOYT Weeks in Treatment: 4 Wound Status Wound Number: 2 Primary Venous Leg Ulcer Etiology: Wound Location: Left Lower Leg - Lateral, Distal Wound Healed - Epithelialized Wounding Event: Gradually Appeared Status: Date Acquired: 10/03/2018 Comorbid Congestive Heart Failure, Coronary Artery Weeks Of Treatment: 4 History: Disease, Hypertension, Peripheral Venous Clustered Wound: No Disease, Type II Diabetes, End Stage Renal Disease, Neuropathy Photos Wound Measurements Length: (cm) 0 % Red Width: (cm) 0 % Red Depth: (cm) 0 Epith Area: (cm) 0 Tunn Volume: (cm) 0 Unde uction in Area: 100% uction in Volume: 100% elialization: Large (67-100%) eling: No rmining: No Wound Description Full Thickness Without Exposed Support Classification: Structures Exudate None Present Amount: Wound Bed Granulation Amount: None Present (0%) Exposed Structure Necrotic Amount: None Present (0%) Fascia Exposed: No Fat Layer (Subcutaneous Tissue) Exposed: No Tendon Exposed: No Muscle Exposed: No Joint Exposed: No Bone Exposed: No Limited to Skin Breakdown Heiney, Burnice ([id number]) Electronic Signature(s) Signed: 11/21/2018 3:47:55 PM By: Montey Hora Entered By: Montey Hora on 11/21/2018 09:06:36 Obremski, Aireana ([id number]) -------------------------------------------------------------------------------- Wound Assessment Details Patient Name: [name] Date of Service: 11/21/2018 8:45 AM Medical Record Number: [id number] Patient Account Number: [id number] Date of Birth/Sex: Jan 29, 1966 (53 y.o. F) Treating RN: Montey Hora Primary Care Calbert Hulsebus: Alean Rinne Other Clinician: Referring Aunisty Reali: Alean Rinne Treating Lorenzo Arscott/Extender: STONE III, HOYT Weeks in Treatment: 4 Wound Status Wound Number: 3 Primary Diabetic Wound/Ulcer of the Lower Extremity Etiology: Wound Location: Right Lower Leg - Anterior Wound Open Wounding Event: Gradually Appeared Status: Date Acquired: 11/21/2018 Comorbid Congestive Heart Failure, Coronary Artery Weeks Of  Treatment: 0 History: Disease, Hypertension, Peripheral Venous Clustered Wound: No Disease, Type II Diabetes, End Stage Renal Disease, Neuropathy Photos Wound Measurements Length: (cm) 0.3 % Reduction in Width: (cm) 0.4 % Reduction in Depth: (cm) 0.1 Epithelializat Area: (cm) 0.094 Tunneling: Volume: (cm) 0.009 Undermining: Area: Volume: ion: Large (67-100%) No No Wound Description Classification: Grade 1 Foul Odor Aft Wound Margin: Flat and Intact Slough/Fibrin Exudate Amount: Small Exudate Type: Serous Exudate Color: amber er Cleansing: No o No Wound Bed Granulation Amount: None Present (0%) Exposed Structure Necrotic Amount: None Present (0%) Fascia Exposed: No Fat Layer (Subcutaneous Tissue) Exposed: No Tendon Exposed: No Muscle Exposed: No Joint Exposed: No Bone Exposed: No Limited to Skin Breakdown Gorr, Thea ([id number]) Treatment Notes Wound #3 (Right, Anterior Lower Leg) Notes silver collagen, ABD, 3 layer Electronic Signature(s) Signed: 11/21/2018 3:47:55 PM By: Montey Hora Entered By: Montey Hora on 11/21/2018 09:09:19 Elling, [name] ([id number]) -------------------------------------------------------------------------------- [place] Details Patient Name: [name] Date of Service: 11/21/2018 8:45 AM Medical Record Number: [id number] Patient Account Number: [id number] Date of Birth/Sex: 1965/11/01 (53 y.o. F) Treating RN: Harold Barban Primary Care Lilianne Delair: Alean Rinne Other Clinician: Referring Andrick Rust: Alean Rinne Treating Harish Bram/Extender: Melburn Hake, HOYT Weeks in Treatment:  4 Vital Signs Time Taken: 08:59 Temperature (F): 98.7 Height (in): 67 Pulse (bpm): 80 Weight (lbs): 326 Respiratory Rate (breaths/min): 18 Body Mass Index (BMI): 51.1 Blood Pressure (mmHg): 140/59 Reference Range: 80 - 120 mg / dl Electronic Signature(s) Signed: 11/21/2018 12:04:21 PM By: Lorine Bears RCP, RRT, CHT Entered By: Lorine Bears on 11/21/2018 09:01:27

## 2018-11-21 NOTE — Progress Notes (Addendum)
[name], [name] ([id number]) Visit Report for 11/21/2018 Chief Complaint Document Details Patient Name: [name], [name] Date of Service: 11/21/2018 8:45 AM Medical Record Number: [id number] Patient Account Number: [id number] Date of Birth/Sex: 09-04-1965 (53 y.o. F) Treating RN: Harold Barban Primary Care Provider: Alean Rinne Other Clinician: Referring Provider: Alean Rinne Treating Provider/Extender: Melburn Hake, HOYT Weeks in Treatment: 4 Information Obtained from: Patient Chief Complaint Left LE Ulcers Electronic Signature(s) Signed: 11/21/2018 8:50:36 AM By: Worthy Keeler PA-C Entered By: Worthy Keeler on 11/21/2018 08:50:36 [name], [name] ([id number]) -------------------------------------------------------------------------------- HPI Details Patient Name: [name] Date of Service: 11/21/2018 8:45 AM Medical Record Number: [id number] Patient Account Number: [id number] Date of Birth/Sex: 01-31-1966 (53 y.o. F) Treating RN: Harold Barban Primary Care Provider: Alean Rinne Other Clinician: Referring Provider: Alean Rinne Treating Provider/Extender: Melburn Hake, HOYT Weeks in Treatment: 4 History of Present Illness HPI Description: 10/24/2018 on evaluation today patient presents for initial evaluation or clinic concerning issues that she has been having with lymphedema for quite some time. Unfortunately she has several wound openings at this point that secondary to her lymphedema/venous stasis are giving her trouble and leaking quite severely. She also has diabetes along with hypertension and stage III chronic kidney disease. Fortunately there is no signs of active infection at this time. She is going to likely require debridement of the left leg ulcer upon evaluation today just based on what I am seeing. Fortunately there is no wound opening on the right. Overall the patient seems to be doing quite well and again there is no evidence of systemic infection which is good news.  No fevers, chills, nausea, vomiting, or diarrhea. 10/31/2018 on evaluation today patient actually appears to be doing excellent in regard to her lower extremity ulcers. She has been tolerating the dressing changes without complication. Fortunately there is no signs of active infection at this time. She has tolerated 3 layer compression wrap without complication. 11/07/2018 upon evaluation today patient actually appears to be doing very well with regard to her left lower extremity ulcers. She has been tolerating the dressing changes without complication. Fortunately there is no signs of active infection. No fevers, chills, nausea, vomiting, or diarrhea. 11/21/2018 upon evaluation today patient appears to be doing quite well with regard to her lower extremity ulcers. In fact both areas seem to be showing signs of good improvement which is excellent. She is not having as much pain as she has in the past and again has a lot of healing compared to previous visits as well. Electronic Signature(s) Signed: 11/21/2018 6:11:38 PM By: Worthy Keeler PA-C Entered By: Worthy Keeler on 11/21/2018 18:11:37 [name], [name] ([id number]) -------------------------------------------------------------------------------- Physical Exam Details Patient Name: [name] Date of Service: 11/21/2018 8:45 AM Medical Record Number: [id number] Patient Account Number: [id number] Date of Birth/Sex: 02/27/1966 (53 y.o. F) Treating RN: Harold Barban Primary Care Provider: Alean Rinne Other Clinician: Referring Provider: Alean Rinne Treating Provider/Extender: STONE III, HOYT Weeks in Treatment: 4 Constitutional Well-nourished and well-hydrated in no acute distress. Respiratory normal breathing without difficulty. clear to auscultation bilaterally. Cardiovascular regular rate and rhythm with normal S1, S2. Psychiatric this patient is able to make decisions and demonstrates good insight into disease process. Alert  and Oriented x 3. pleasant and cooperative. Notes Patient's wound bed currently did not require any sharp debridement at any location which is excellent news. She seems to be tolerating the dressing changes without complication which is also excellent news. Overall I am very happy with her progress that she is  made. We are getting closer to resolution. Electronic Signature(s) Signed: 11/21/2018 6:12:16 PM By: Worthy Keeler PA-C Entered By: Worthy Keeler on 11/21/2018 18:12:16 [name], [name] ([id number]) -------------------------------------------------------------------------------- Physician Orders Details Patient Name: [name] Date of Service: 11/21/2018 8:45 AM Medical Record Number: [id number] Patient Account Number: [id number] Date of Birth/Sex: July 20, 1965 (53 y.o. F) Treating RN: Harold Barban Primary Care Provider: Alean Rinne Other Clinician: Referring Provider: Alean Rinne Treating Provider/Extender: Melburn Hake, HOYT Weeks in Treatment: 4 Verbal / Phone Orders: No Diagnosis Coding ICD-10 Coding Code Description I89.0 Lymphedema, not elsewhere classified I87.2 Venous insufficiency (chronic) (peripheral) L97.822 Non-pressure chronic ulcer of other part of left lower leg with fat layer exposed E11.622 Type 2 diabetes mellitus with other skin ulcer I10 Essential (primary) hypertension N18.3 Chronic kidney disease, stage 3 (moderate) Wound Cleansing Wound #1 Left,Proximal,Lateral Lower Leg o Cleanse wound with mild soap and water Primary Wound Dressing Wound #1 Left,Proximal,Lateral Lower Leg o Silver Collagen Wound #3 Right,Anterior Lower Leg o Silver Collagen Secondary Dressing Wound #1 Left,Proximal,Lateral Lower Leg o ABD pad Wound #3 Right,Anterior Lower Leg o ABD pad Follow-up Appointments Wound #1 Left,Proximal,Lateral Lower Leg o Return Appointment in 1 week. - Tuesday, September 8th (due to Holiday) Wound #3 Right,Anterior Lower  Leg o Return Appointment in 1 week. - Tuesday, September 8th (due to Holiday) Edema Control Wound #1 Left,Proximal,Lateral Lower Leg o 3 Layer Compression System - Bilateral Wound #3 Right,Anterior Lower Leg o 3 Layer Compression System - Bilateral Dempsey, Wren ([id number]) Off-Loading Wound #1 Left,Proximal,Lateral Lower Leg o Other: - Elevate leg when sitting Wound #3 Right,Anterior Lower Leg o Other: - Elevate leg when sitting Electronic Signature(s) Signed: 11/21/2018 4:05:51 PM By: Harold Barban Signed: 11/21/2018 6:34:02 PM By: Worthy Keeler PA-C Entered By: Harold Barban on 11/21/2018 09:28:12 [name], [name] ([id number]) -------------------------------------------------------------------------------- Problem List Details Patient Name: [name] Date of Service: 11/21/2018 8:45 AM Medical Record Number: [id number] Patient Account Number: [id number] Date of Birth/Sex: [birth year] (53 y.o. F) Treating RN: Harold Barban Primary Care Provider: Alean Rinne Other Clinician: Referring Provider: Alean Rinne Treating Provider/Extender: Melburn Hake, HOYT Weeks in Treatment: 4 Active Problems ICD-10 Evaluated Encounter Code Description Active Date Today Diagnosis I89.0 Lymphedema, not elsewhere classified 10/24/2018 No Yes I87.2 Venous insufficiency (chronic) (peripheral) 10/24/2018 No Yes L97.822 Non-pressure chronic ulcer of other part of left lower leg with 10/24/2018 No Yes fat layer exposed E11.622 Type 2 diabetes mellitus with other skin ulcer 10/24/2018 No Yes I10 Essential (primary) hypertension 10/24/2018 No Yes N18.3 Chronic kidney disease, stage 3 (moderate) 10/24/2018 No Yes Inactive Problems Resolved Problems Electronic Signature(s) Signed: 11/21/2018 8:50:29 AM By: Worthy Keeler PA-C Entered By: Worthy Keeler on 11/21/2018 08:50:28 [name], [name] ([id number]) -------------------------------------------------------------------------------- Progress  Note Details Patient Name: [name] Date of Service: 11/21/2018 8:45 AM Medical Record Number: [id number] Patient Account Number: [id number] Date of Birth/Sex: 1965-08-28 (53 y.o. F) Treating RN: Harold Barban Primary Care Provider: Alean Rinne Other Clinician: Referring Provider: Alean Rinne Treating Provider/Extender: Melburn Hake, HOYT Weeks in Treatment: 4 Subjective Chief Complaint Information obtained from Patient Left LE Ulcers History of Present Illness (HPI) 10/24/2018 on evaluation today patient presents for initial evaluation or clinic concerning issues that she has been having with lymphedema for quite some time. Unfortunately she has several wound openings at this point that secondary to her lymphedema/venous stasis are giving her trouble and leaking quite severely. She also has diabetes along with hypertension and stage III chronic kidney disease. Fortunately there is no signs of active  infection at this time. She is going to likely require debridement of the left leg ulcer upon evaluation today just based on what I am seeing. Fortunately there is no wound opening on the right. Overall the patient seems to be doing quite well and again there is no evidence of systemic infection which is good news. No fevers, chills, nausea, vomiting, or diarrhea. 10/31/2018 on evaluation today patient actually appears to be doing excellent in regard to her lower extremity ulcers. She has been tolerating the dressing changes without complication. Fortunately there is no signs of active infection at this time. She has tolerated 3 layer compression wrap without complication. 11/07/2018 upon evaluation today patient actually appears to be doing very well with regard to her left lower extremity ulcers. She has been tolerating the dressing changes without complication. Fortunately there is no signs of active infection. No fevers, chills, nausea, vomiting, or diarrhea. 11/21/2018 upon evaluation  today patient appears to be doing quite well with regard to her lower extremity ulcers. In fact both areas seem to be showing signs of good improvement which is excellent. She is not having as much pain as she has in the past and again has a lot of healing compared to previous visits as well. Patient History Information obtained from Patient. Family History Cancer - Father,Siblings, Diabetes - Mother,Siblings, Heart Disease - Mother,Maternal Grandparents, Hypertension - Mother,Maternal Grandparents, No family history of Hereditary Spherocytosis, Kidney Disease, Lung Disease, Seizures, Stroke, Thyroid Problems, Tuberculosis. Social History Never smoker, Marital Status - Divorced, Alcohol Use - Never, Drug Use - No History, Caffeine Use - Never. Medical History Cardiovascular Patient has history of Congestive Heart Failure, Coronary Artery Disease - CABG 2010, Hypertension, Peripheral Venous Disease Denies history of Angina, Arrhythmia, Deep Vein Thrombosis, Hypotension, Myocardial Infarction, Peripheral Arterial Disease, Phlebitis, Vasculitis Endocrine Patient has history of Type II Diabetes [name], [name] ([id number]) Denies history of Type I Diabetes Genitourinary Patient has history of 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 Integumentary (Skin) Denies history of History of Burn, History of pressure wounds Neurologic Patient has history of Neuropathy Denies history of Dementia, Quadriplegia, Paraplegia, Seizure Disorder Medical And Surgical History Notes Cardiovascular HLD Review of Systems (ROS) Constitutional Symptoms (General Health) Denies complaints or symptoms of Fatigue, Fever, Chills, Marked Weight Change. Respiratory Denies complaints or symptoms of Chronic or frequent coughs, Shortness of Breath. Cardiovascular Denies complaints or symptoms of Chest pain, LE edema. Psychiatric Denies complaints or symptoms of Anxiety,  Claustrophobia. Objective Constitutional Well-nourished and well-hydrated in no acute distress. Vitals Time Taken: 8:59 AM, Height: 67 in, Weight: 326 lbs, BMI: 51.1, Temperature: 98.7 F, Pulse: 80 bpm, Respiratory Rate: 18 breaths/min, Blood Pressure: 140/59 mmHg. Respiratory normal breathing without difficulty. clear to auscultation bilaterally. Cardiovascular regular rate and rhythm with normal S1, S2. Psychiatric this patient is able to make decisions and demonstrates good insight into disease process. Alert and Oriented x 3. pleasant and cooperative. General Notes: Patient's wound bed currently did not require any sharp debridement at any location which is excellent news. She seems to be tolerating the dressing changes without complication which is also excellent news. Overall I am very happy with her progress that she is made. We are getting closer to resolution. Integumentary (Hair, Skin) Wound #1 status is Open. Original cause of wound was Gradually Appeared. The wound is located on the Left,Proximal,Lateral Lower Leg. The wound measures 0.4cm length x 0.5cm width x 0.1cm depth; 0.157cm^2 area and 0.016cm^3 volume. There is Fat Layer (Subcutaneous Tissue) Exposed  exposed. There is no tunneling or undermining [name], [name] ([id number]) noted. There is a medium amount of serous drainage noted. The wound margin is flat and intact. There is large (67-100%) pink granulation within the wound bed. There is no necrotic tissue within the wound bed. Wound #2 status is Healed - Epithelialized. Original cause of wound was Gradually Appeared. The wound is located on the Left,Distal,Lateral Lower Leg. The wound measures 0cm length x 0cm width x 0cm depth; 0cm^2 area and 0cm^3 volume. The wound is limited to skin breakdown. There is no tunneling or undermining noted. There is a none present amount of drainage noted. There is no granulation within the wound bed. There is no necrotic tissue  within the wound bed. Wound #3 status is Open. Original cause of wound was Gradually Appeared. The wound is located on the Right,Anterior Lower Leg. The wound measures 0.3cm length x 0.4cm width x 0.1cm depth; 0.094cm^2 area and 0.009cm^3 volume. The wound is limited to skin breakdown. There is no tunneling or undermining noted. There is a small amount of serous drainage noted. The wound margin is flat and intact. There is no granulation within the wound bed. There is no necrotic tissue within the wound bed. Assessment Active Problems ICD-10 Lymphedema, not elsewhere classified Venous insufficiency (chronic) (peripheral) Non-pressure chronic ulcer of other part of left lower leg with fat layer exposed Type 2 diabetes mellitus with other skin ulcer Essential (primary) hypertension Chronic kidney disease, stage 3 (moderate) Plan Wound Cleansing: Wound #1 Left,Proximal,Lateral Lower Leg: Cleanse wound with mild soap and water Primary Wound Dressing: Wound #1 Left,Proximal,Lateral Lower Leg: Silver Collagen Wound #3 Right,Anterior Lower Leg: Silver Collagen Secondary Dressing: Wound #1 Left,Proximal,Lateral Lower Leg: ABD pad Wound #3 Right,Anterior Lower Leg: ABD pad Follow-up Appointments: Wound #1 Left,Proximal,Lateral Lower Leg: Return Appointment in 1 week. - Tuesday, September 8th (due to Holiday) Wound #3 Right,Anterior Lower Leg: Return Appointment in 1 week. - Tuesday, September 8th (due to Holiday) Edema Control: Wound #1 Left,Proximal,Lateral Lower Leg: 3 Layer Compression System - Bilateral Wound #3 Right,Anterior Lower Leg: [name], [name] ([id number]) 3 Layer Compression System - Bilateral Off-Loading: Wound #1 Left,Proximal,Lateral Lower Leg: Other: - Elevate leg when sitting Wound #3 Right,Anterior Lower Leg: Other: - Elevate leg when sitting 1. I am in a suggest at this time that we go ahead and continue with the current orders regarding the silver collagen to  the wound areas. In fact we can add this to the new wound on the right lower extremity as well I think this will heal quite quickly. 2. We will also go ahead and provide a bilateral 3 layer compression wraps for both lower extremities in order to help with her edema and the patient is in agreement with that plan as well. 3. I am in a suggest she continue to try to elevate her legs as often as possible in order to keep things under control from the standpoint of edema. We will see patient back for reevaluation in 1 week here in the clinic. If anything worsens or changes patient will contact our office for additional recommendations. Electronic Signature(s) Signed: 11/21/2018 6:13:28 PM By: Worthy Keeler PA-C Entered By: Worthy Keeler on 11/21/2018 18:13:28 [name], [name] ([id number]) -------------------------------------------------------------------------------- ROS/PFSH Details Patient Name: [name] Date of Service: 11/21/2018 8:45 AM Medical Record Number: [id number] Patient Account Number: [id number] Date of Birth/Sex: 1965/03/28 (53 y.o. F) Treating RN: Harold Barban Primary Care Provider: Alean Rinne Other Clinician: Referring Provider: Alean Rinne Treating Provider/Extender: Joaquim Lai  III, HOYT Weeks in Treatment: 4 Information Obtained From Patient Constitutional Symptoms (General Health) Complaints and Symptoms: Negative for: Fatigue; Fever; Chills; Marked Weight Change Respiratory Complaints and Symptoms: Negative for: Chronic or frequent coughs; Shortness of Breath Cardiovascular Complaints and Symptoms: Negative for: Chest pain; LE edema Medical History: Positive for: Congestive Heart Failure; Coronary Artery Disease - CABG 2010; Hypertension; Peripheral Venous Disease Negative for: Angina; Arrhythmia; Deep Vein Thrombosis; Hypotension; Myocardial Infarction; Peripheral Arterial Disease; Phlebitis; Vasculitis Past Medical History  Notes: HLD Psychiatric Complaints and Symptoms: Negative for: Anxiety; Claustrophobia Endocrine Medical History: Positive for: Type II Diabetes Negative for: Type I Diabetes Time with diabetes: 1997 Treated with: Insulin Genitourinary Medical History: Positive for: 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 Integumentary (Skin) Medical History: Negative for: History of Burn; History of pressure wounds Neurologic Pettibone, Eugene ([id number]) Medical History: Positive for: Neuropathy Negative for: Dementia; Quadriplegia; Paraplegia; Seizure Disorder Immunizations Pneumococcal Vaccine: Received Pneumococcal Vaccination: No Implantable Devices None Family and Social History Cancer: Yes - Father,Siblings; Diabetes: Yes - Mother,Siblings; Heart Disease: Yes - Mother,Maternal Grandparents; Hereditary Spherocytosis: No; Hypertension: Yes - Mother,Maternal Grandparents; Kidney Disease: No; Lung Disease: No; Seizures: No; Stroke: No; Thyroid Problems: No; Tuberculosis: No; Never smoker; Marital Status - Divorced; Alcohol Use: Never; Drug Use: No History; Caffeine Use: Never; Financial Concerns: No; Food, Clothing or Shelter Needs: No; Support System Lacking: No; Transportation Concerns: No Physician Affirmation I have reviewed and agree with the above information. Electronic Signature(s) Signed: 11/21/2018 6:34:02 PM By: Worthy Keeler PA-C Signed: 11/23/2018 4:31:02 PM By: Harold Barban Entered By: Worthy Keeler on 11/21/2018 18:11:59 Dubin, Christana ([id number]) -------------------------------------------------------------------------------- SuperBill Details Patient Name: [name] Date of Service: 11/21/2018 Medical Record Number: [id number] Patient Account Number: [id number] Date of Birth/Sex: 1965/12/14 (53 y.o. F) Treating RN: Harold Barban Primary Care Provider: Alean Rinne Other Clinician: Referring Provider: Alean Rinne Treating Provider/Extender: Melburn Hake,  HOYT Weeks in Treatment: 4 Diagnosis Coding ICD-10 Codes Code Description I89.0 Lymphedema, not elsewhere classified I87.2 Venous insufficiency (chronic) (peripheral) L97.822 Non-pressure chronic ulcer of other part of left lower leg with fat layer exposed E11.622 Type 2 diabetes mellitus with other skin ulcer I10 Essential (primary) hypertension N18.3 Chronic kidney disease, stage 3 (moderate) Facility Procedures CPT4 Code: [id number] Description: 99213 - WOUND CARE VISIT-LEV 3 EST PT Modifier: Quantity: 1 Physician Procedures CPT4 Code Description: [id number] 99214 - WC PHYS LEVEL 4 - EST PT ICD-10 Diagnosis Description I89.0 Lymphedema, not elsewhere classified I87.2 Venous insufficiency (chronic) (peripheral) L97.822 Non-pressure chronic ulcer of other part of left lower leg  wit E11.622 Type 2 diabetes mellitus with other skin ulcer Modifier: h fat layer expos Quantity: 1 ed Electronic Signature(s) Signed: 11/21/2018 6:13:41 PM By: Worthy Keeler PA-C Entered By: Worthy Keeler on 11/21/2018 18:13:41

## 2018-11-24 ENCOUNTER — Other Ambulatory Visit: Payer: Self-pay

## 2018-11-24 ENCOUNTER — Telehealth (HOSPITAL_COMMUNITY): Payer: Self-pay

## 2018-11-24 ENCOUNTER — Ambulatory Visit (HOSPITAL_COMMUNITY)
Admission: RE | Admit: 2018-11-24 | Discharge: 2018-11-24 | Disposition: A | Discharge status: Home / Self Care | Payer: Medicare Other | Source: Ambulatory Visit | Attending: Internal Medicine | Admitting: Internal Medicine

## 2018-11-24 DIAGNOSIS — I5022 Chronic systolic (congestive) heart failure: Secondary | ICD-10-CM

## 2018-11-24 DIAGNOSIS — Z01812 Encounter for preprocedural laboratory examination: Secondary | ICD-10-CM | POA: Diagnosis not present

## 2018-11-24 LAB — BASIC METABOLIC PANEL
Anion gap: 13 (ref 5–15)
BUN: 37 mg/dL — ABNORMAL HIGH (ref 6–20)
CO2: 25 mmol/L (ref 22–32)
Calcium: 9.1 mg/dL (ref 8.9–10.3)
Chloride: 101 mmol/L (ref 98–111)
Creatinine, Ser: 2.38 mg/dL — ABNORMAL HIGH (ref 0.44–1.00)
GFR calc Af Amer: 26 mL/min — ABNORMAL LOW (ref >60)
GFR calc non Af Amer: 22 mL/min — ABNORMAL LOW (ref >60)
Glucose, Bld: 171 mg/dL — ABNORMAL HIGH (ref 70–99)
Potassium: 3.8 mmol/L (ref 3.5–5.1)
Sodium: 139 mmol/L (ref 135–145)

## 2018-11-24 NOTE — Telephone Encounter (Signed)
Pt aware of lab results and appreciative.

## 2018-11-24 NOTE — Telephone Encounter (Signed)
-----   Message from Larey Dresser, MD sent at 11/24/2018  3:41 PM EDT ----- Creatinine is better, please let her know.

## 2018-11-29 ENCOUNTER — Ambulatory Visit: Payer: Medicare Other | Admitting: Physician Assistant

## 2018-12-05 ENCOUNTER — Encounter: Payer: Medicare Other | Attending: Physician Assistant | Admitting: Physician Assistant

## 2018-12-05 ENCOUNTER — Other Ambulatory Visit: Payer: Self-pay

## 2018-12-05 DIAGNOSIS — I872 Venous insufficiency (chronic) (peripheral): Secondary | ICD-10-CM | POA: Insufficient documentation

## 2018-12-05 DIAGNOSIS — E1142 Type 2 diabetes mellitus with diabetic polyneuropathy: Secondary | ICD-10-CM | POA: Insufficient documentation

## 2018-12-05 DIAGNOSIS — E11621 Type 2 diabetes mellitus with foot ulcer: Secondary | ICD-10-CM | POA: Diagnosis not present

## 2018-12-05 DIAGNOSIS — L97822 Non-pressure chronic ulcer of other part of left lower leg with fat layer exposed: Secondary | ICD-10-CM | POA: Diagnosis not present

## 2018-12-05 DIAGNOSIS — N186 End stage renal disease: Secondary | ICD-10-CM | POA: Insufficient documentation

## 2018-12-05 DIAGNOSIS — Z951 Presence of aortocoronary bypass graft: Secondary | ICD-10-CM | POA: Insufficient documentation

## 2018-12-05 DIAGNOSIS — I132 Hypertensive heart and chronic kidney disease with heart failure and with stage 5 chronic kidney disease, or end stage renal disease: Secondary | ICD-10-CM | POA: Diagnosis not present

## 2018-12-05 DIAGNOSIS — I2510 Atherosclerotic heart disease of native coronary artery without angina pectoris: Secondary | ICD-10-CM | POA: Insufficient documentation

## 2018-12-05 DIAGNOSIS — I890 Lymphedema, not elsewhere classified: Secondary | ICD-10-CM | POA: Diagnosis not present

## 2018-12-05 DIAGNOSIS — I509 Heart failure, unspecified: Secondary | ICD-10-CM | POA: Insufficient documentation

## 2018-12-05 DIAGNOSIS — E1151 Type 2 diabetes mellitus with diabetic peripheral angiopathy without gangrene: Secondary | ICD-10-CM | POA: Insufficient documentation

## 2018-12-05 NOTE — Progress Notes (Signed)
[name] ([id number]) Visit Report for 12/05/2018 Arrival Information Details Patient Name: [name], [name] Date of Service: 12/05/2018 11:00 AM Medical Record Number: [id number] Patient Account Number: [id number] Date of Birth/Sex: [birth year] (53 y.o. F) Treating RN: Army Melia Primary Care Debhora Titus: Alean Rinne Other Clinician: Referring Geriann Lafont: Alean Rinne Treating Trong Gosling/Extender: Melburn Hake, HOYT Weeks in Treatment: 6 Visit Information History Since Last Visit Added or deleted any medications: No Patient Arrived: Ambulatory Any new allergies or adverse reactions: No Arrival Time: 11:10 Had a fall or experienced change in No Accompanied By: self activities of daily living that may affect Transfer Assistance: None risk of falls: Signs or symptoms of abuse/neglect since last visito No Hospitalized since last visit: No Has Dressing in Place as Prescribed: Yes Pain Present Now: No Electronic Signature(s) Signed: 12/05/2018 11:57:01 AM By: Army Melia Entered By: Army Melia on 12/05/2018 11:11:11 [place], [name] ([id number]) -------------------------------------------------------------------------------- Clinic Level of Care Assessment Details Patient Name: [name] Date of Service: 12/05/2018 11:00 AM Medical Record Number: [id number] Patient Account Number: [id number] Date of Birth/Sex: Aug 01, 1965 (53 y.o. F) Treating RN: Harold Barban Primary Care Jelan Batterton: Alean Rinne Other Clinician: Referring Tyjuan Demetro: Alean Rinne Treating Dona Klemann/Extender: Melburn Hake, HOYT Weeks in Treatment: 6 Clinic Level of Care Assessment Items TOOL 4 Quantity Score []  - Use when only an EandM is performed on FOLLOW-UP visit 0 ASSESSMENTS - Nursing Assessment / Reassessment X - Reassessment of Co-morbidities (includes updates in patient status) 1 10 X- 1 5 Reassessment of Adherence to Treatment Plan ASSESSMENTS - Wound and Skin Assessment / Reassessment X - Simple Wound  Assessment / Reassessment - one wound 1 5 []  - 0 Complex Wound Assessment / Reassessment - multiple wounds []  - 0 Dermatologic / Skin Assessment (not related to wound area) ASSESSMENTS - Focused Assessment []  - Circumferential Edema Measurements - multi extremities 0 []  - 0 Nutritional Assessment / Counseling / Intervention []  - 0 Lower Extremity Assessment (monofilament, tuning fork, pulses) []  - 0 Peripheral Arterial Disease Assessment (using hand held doppler) ASSESSMENTS - Ostomy and/or Continence Assessment and Care []  - Incontinence Assessment and Management 0 []  - 0 Ostomy Care Assessment and Management (repouching, etc.) PROCESS - Coordination of Care X - Simple Patient / Family Education for ongoing care 1 15 []  - 0 Complex (extensive) Patient / Family Education for ongoing care []  - 0 Staff obtains Programmer, systems, Records, Test Results / Process Orders []  - 0 Staff telephones HHA, Nursing Homes / Clarify orders / etc []  - 0 Routine Transfer to another Facility (non-emergent condition) []  - 0 Routine Hospital Admission (non-emergent condition) []  - 0 New Admissions / Biomedical engineer / Ordering NPWT, Apligraf, etc. []  - 0 Emergency Hospital Admission (emergent condition) X- 1 10 Simple Discharge Coordination Landau, Nayely ([id number]) []  - 0 Complex (extensive) Discharge Coordination PROCESS - Special Needs []  - Pediatric / Minor Patient Management 0 []  - 0 Isolation Patient Management []  - 0 Hearing / Language / Visual special needs []  - 0 Assessment of Community assistance (transportation, D/C planning, etc.) []  - 0 Additional assistance / Altered mentation []  - 0 Support Surface(s) Assessment (bed, cushion, seat, etc.) INTERVENTIONS - Wound Cleansing / Measurement X - Simple Wound Cleansing - one wound 1 5 []  - 0 Complex Wound Cleansing - multiple wounds X- 1 5 Wound Imaging (photographs - any number of wounds) []  - 0 Wound Tracing (instead of  photographs) X- 1 5 Simple Wound Measurement - one wound []  - 0 Complex Wound Measurement - multiple wounds INTERVENTIONS -  Wound Dressings X - Small Wound Dressing one or multiple wounds 1 10 []  - 0 Medium Wound Dressing one or multiple wounds []  - 0 Large Wound Dressing one or multiple wounds []  - 0 Application of Medications - topical []  - 0 Application of Medications - injection INTERVENTIONS - Miscellaneous []  - External ear exam 0 []  - 0 Specimen Collection (cultures, biopsies, blood, body fluids, etc.) []  - 0 Specimen(s) / Culture(s) sent or taken to Lab for analysis []  - 0 Patient Transfer (multiple staff / Harrel Lemon Lift / Similar devices) []  - 0 Simple Staple / Suture removal (25 or less) []  - 0 Complex Staple / Suture removal (26 or more) []  - 0 Hypo / Hyperglycemic Management (close monitor of Blood Glucose) []  - 0 Ankle / Brachial Index (ABI) - do not check if billed separately X- 1 5 Vital Signs Saha, Ozie ([id number]) Has the patient been seen at the hospital within the last three years: Yes Total Score: 75 Level Of Care: New/Established - Level 2 Electronic Signature(s) Signed: 12/05/2018 4:31:37 PM By: Harold Barban Entered By: Harold Barban on 12/05/2018 11:48:36 Lieder, [name] -------------------------------------------------------------------------------- Encounter Discharge Information Details Patient Name: [name] Date of Service: 12/05/2018 11:00 AM Medical Record Number: [id number] Patient Account Number: [id number] Date of Birth/Sex: 10-21-1965 (53 y.o. F) Treating RN: Harold Barban Primary Care Timber Marshman: Alean Rinne Other Clinician: Referring Timera Windt: Alean Rinne Treating Leonarda Leis/Extender: Melburn Hake, HOYT Weeks in Treatment: 6 Encounter Discharge Information Items Discharge Condition: Stable Ambulatory Status: Ambulatory Discharge Destination: Home Transportation: Private Auto Accompanied By: self Schedule  Follow-up Appointment: Yes Clinical Summary of Care: Electronic Signature(s) Signed: 12/05/2018 4:31:37 PM By: Harold Barban Entered By: Harold Barban on 12/05/2018 11:53:09 [place]uster, [name] -------------------------------------------------------------------------------- Lower Extremity Assessment Details Patient Name: [name] Date of Service: 12/05/2018 11:00 AM Medical Record Number: [id number] Patient Account Number: [id number] Date of Birth/Sex: Jul 21, 1965 (53 y.o. F) Treating RN: Army Melia Primary Care Rini Moffit: Alean Rinne Other Clinician: Referring Elia Nunley: Alean Rinne Treating Salvadore Valvano/Extender: STONE III, HOYT Weeks in Treatment: 6 Edema Assessment Assessed: [Left: No] [Right: No] Edema: [Left: No] [Right: No] Calf Left: Right: Point of Measurement: 32 cm From Medial Instep 49 cm 45 cm Ankle Left: Right: Point of Measurement: 12 cm From Medial Instep 25 cm 21 cm Vascular Assessment Pulses: Dorsalis Pedis Palpable: [Left:Yes] [Right:Yes] Electronic Signature(s) Signed: 12/05/2018 11:57:01 AM By: Army Melia Entered By: Army Melia on 12/05/2018 11:17:10 [place], [name] ([id number]) -------------------------------------------------------------------------------- Multi Wound Chart Details Patient Name: [name] Date of Service: 12/05/2018 11:00 AM Medical Record Number: [id number] Patient Account Number: [id number] Date of Birth/Sex: 02-07-1966 (53 y.o. 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 Comorbid History: Congestive Heart Failure, Congestive Heart Failure, N/A Coronary Artery Disease, Coronary Artery Disease, Hypertension, Peripheral Hypertension, Peripheral Venous Disease, Type II Venous Disease, Type II Diabetes, End Stage Renal Diabetes, End Stage Renal Disease, Neuropathy Disease, Neuropathy Date Acquired: 10/03/2018 11/21/2018 N/A Weeks of Treatment: 6 2 N/A Wound Status: Open Healed - Epithelialized N/A Measurements L x W x D 0.1x0.1x0.1 0x0x0 N/A (cm) Area (cm) : 0.008  0 N/A Volume (cm) : 0.001 0 N/A % Reduction in Area: 96.90% 100.00% N/A % Reduction in Volume: 96.20% 100.00% N/A Classification: Full Thickness Without Grade 1 N/A Exposed Support Structures Exudate Amount: Medium Small N/A Exudate Type: Serous Serous N/A Exudate Color: amber amber N/A Wound Margin: Flat and Intact Flat and Intact N/A Granulation Amount: None Present (0%) None Present (0%) N/A Necrotic Amount: None Present (0%) None Present (0%) N/A Exposed Structures: N/A Dipaola, Montoya ([id number]) Fat Layer (Subcutaneous Fascia: No Tissue) Exposed: Yes Fat Layer (Subcutaneous Fascia: No Tissue) Exposed: No Tendon: No Tendon: No Muscle: No Muscle: No Joint: No Joint: No Bone: No Bone: No Limited to Skin Breakdown Epithelialization: Large (67-100%) Large (67-100%) N/A Treatment Notes Electronic Signature(s) Signed: 12/05/2018 4:31:37 PM By: Harold Barban Entered By: Harold Barban on 12/05/2018 11:46:07 [place], [name] ([id number]) -------------------------------------------------------------------------------- Multi-Disciplinary Care Plan Details Patient Name: [name] Date of Service: 12/05/2018 11:00 AM Medical Record Number: [id number] Patient Account Number: [id number] Date of Birth/Sex: 07/26/1965 (53 y.o. F) Treating RN: Harold Barban Primary Care  Denorris Reust: Alean Rinne Other Clinician: Referring Breana Litts: Alean Rinne Treating Marck Mcclenny/Extender: Melburn Hake, HOYT Weeks in Treatment: 6 Active Inactive Venous Leg Ulcer Nursing Diagnoses: Knowledge deficit related to disease process and management Goals: Patient/caregiver will verbalize understanding of disease process and disease management Date Initiated: 10/24/2018 Target Resolution Date: 11/24/2018 Goal Status: Active Interventions: Assess peripheral edema status every visit. Notes: Wound/Skin Impairment Nursing Diagnoses: Impaired tissue integrity Knowledge deficit related to ulceration/compromised skin integrity Goals: Ulcer/skin breakdown will have a volume reduction of 30% by week 4 Date Initiated: 10/24/2018 Target Resolution Date: 11/24/2018 Goal Status: Active Interventions: Assess patient/caregiver ability to obtain necessary supplies Assess patient/caregiver ability to perform ulcer/skin care regimen upon admission and as needed Assess ulceration(s) every visit Provide education on ulcer and skin care Notes: Electronic Signature(s) Signed: 12/05/2018 4:31:37 PM By: Harold Barban Entered By: Harold Barban on 12/05/2018 11:45:54 Rozas, Cella ([id number]) -------------------------------------------------------------------------------- Pain Assessment Details Patient Name: [name] Date of Service: 12/05/2018 11:00 AM Medical Record Number: [id number] Patient Account Number: [id number] Date of Birth/Sex: July 08, 1965 (53 y.o. F) Treating RN: Army Melia Primary Care Daegan Arizmendi: Alean Rinne Other Clinician: Referring Denice Cardon: Alean Rinne Treating Monya Kozakiewicz/Extender: Melburn Hake, HOYT Weeks in Treatment: 6 Active Problems Location of Pain Severity and Description of Pain Patient Has Paino No Site Locations Pain Management and Medication Current Pain Management: Electronic Signature(s) Signed: 12/05/2018 11:57:01 AM By: Army Melia Entered By: Army Melia  on 12/05/2018 11:11:22 [place], [name] ([id number]) -------------------------------------------------------------------------------- Patient/Caregiver Education Details Patient Name: [name] Date of Service: 12/05/2018 11:00 AM Medical Record Number: [id number] Patient Account Number: [id number] Date of Birth/Gender: [birth year] (53 y.o. F) Treating RN: Harold Barban Primary Care Physician: Alean Rinne Other Clinician: Referring Physician: Alean Rinne Treating Physician/Extender: Sharalyn Ink in Treatment: 6 Education Assessment Education Provided To: Patient Education Topics Provided Wound/Skin Impairment: Handouts: Caring for Your Ulcer Methods: Demonstration, Explain/Verbal Responses: State content correctly Electronic Signature(s) Signed: 12/05/2018 4:31:37 PM By: Harold Barban Entered By: Harold Barban on 12/05/2018 11:46:26 [place], [place] ([id number]) -------------------------------------------------------------------------------- Wound Assessment Details Patient Name: [name] Date of Service: 12/05/2018 11:00 AM Medical Record Number: [id number] Patient Account Number: [id number] Date of Birth/Sex: 12/14/1965 (53 y.o. F) Treating RN: Army Melia Primary Care Toron Bowring: Alean Rinne Other Clinician: Referring Tykia Mellone: Alean Rinne Treating Neilan Rizzo/Extender: STONE III, HOYT Weeks in Treatment: 6 Wound Status Wound Number: 1 Primary Venous Leg Ulcer Etiology: Wound Location: Left Lower Leg - Lateral, Proximal Wound Open Wounding Event: Gradually Appeared Status:  Date Acquired: 10/03/2018 Comorbid Congestive Heart Failure, Coronary Artery Weeks Of Treatment: 6 History: Disease, Hypertension, Peripheral Venous Clustered Wound: No Disease, Type II Diabetes, End Stage Renal Disease, Neuropathy Photos Wound Measurements Length: (cm) 0.1 Width: (cm) 0.1 Depth: (cm) 0.1 Area: (cm) 0.008 Volume: (cm) 0.001 % Reduction in Area: 96.9% %  Reduction in Volume: 96.2% Epithelialization: Large (67-100%) Tunneling: No Undermining: No Wound Description Full Thickness Without Exposed Support Classification: Structures Wound Margin: Flat and Intact Exudate Medium Amount: Exudate Type: Serous Exudate Color: amber Foul Odor After Cleansing: No Slough/Fibrino No Wound Bed Granulation Amount: None Present (0%) Exposed Structure Necrotic Amount: None Present (0%) Fascia Exposed: No Fat Layer (Subcutaneous Tissue) Exposed: Yes Tendon Exposed: No Muscle Exposed: No Joint Exposed: No Bone Exposed: No Novitsky, Zanyiah ([id number]) Treatment Notes Wound #1 (Left, Proximal, Lateral Lower Leg) Notes silver collagen, dry gauze, conform, 2-Layer Tubi Grip "F" Left leg only Electronic Signature(s) Signed: 12/05/2018 11:57:01 AM By: Army Melia Entered By: Army Melia on 12/05/2018 11:14:03 [place], Ripley ([id number]) -------------------------------------------------------------------------------- Wound Assessment Details Patient Name: [name] Date of Service: 12/05/2018 11:00 AM Medical Record Number: [id number] Patient Account Number: [id number] Date of Birth/Sex: 1965/05/22 (53 y.o. F) Treating RN: Harold Barban Primary Care Kanani Mowbray: Alean Rinne Other Clinician: Referring Keirstyn Aydt: Alean Rinne Treating Soni Kegel/Extender: STONE III, HOYT Weeks in Treatment: 6 Wound Status Wound Number: 3 Primary Diabetic Wound/Ulcer of the Lower Extremity Etiology: Wound Location: Right, Anterior Lower Leg Wound Healed - Epithelialized Wounding Event: Gradually Appeared Status: Date Acquired: 11/21/2018 Comorbid Congestive Heart Failure, Coronary Artery Weeks Of Treatment: 2 History: Disease, Hypertension, Peripheral Venous Clustered Wound: No Disease, Type II Diabetes, End Stage Renal Disease, Neuropathy Photos Wound Measurements Length: (cm) 0 % Reducti Width: (cm) 0 % Reducti Depth: (cm) 0 Epithelia Area: (cm)  0 Volume: (cm) 0 on in Area: 100% on in Volume: 100% lization: Large (67-100%) Wound Description Classification: Grade 1 Foul Odor Wound Margin: Flat and Intact Slough/Fi Exudate Amount: Small Exudate Type: Serous Exudate Color: amber After Cleansing: No brino No Wound Bed Granulation Amount: None Present (0%) Exposed Structure Necrotic Amount: None Present (0%) Fascia Exposed: No Fat Layer (Subcutaneous Tissue) Exposed: No Tendon Exposed: No Muscle Exposed: No Joint Exposed: No Bone Exposed: No Limited to Skin Breakdown Moxley, Mellissa ([id number]) Electronic Signature(s) Signed: 12/05/2018 4:31:37 PM By: Harold Barban Entered By: Harold Barban on 12/05/2018 11:45:28 [place], [name] ([id number]) -------------------------------------------------------------------------------- [place] Details Patient Name: [name] Date of Service: 12/05/2018 11:00 AM Medical Record Number: [id number] Patient Account Number: [id number] Date of Birth/Sex: [birth year] (53 y.o. F) Treating RN: Army Melia Primary Care Marjorie Deprey: Alean Rinne Other Clinician: Referring Josslyn Ciolek: Alean Rinne Treating Latonya Nelon/Extender: Melburn Hake, HOYT Weeks in Treatment: 6 Vital Signs Time Taken: 11:11 Temperature (F): 97.9 Height (in): 67 Pulse (bpm): 80 Weight (lbs): 326 Respiratory Rate (breaths/min): 16 Body Mass Index (BMI): 51.1 Blood Pressure (mmHg): 134/61 Reference Range: 80 - 120 mg / dl Electronic Signature(s) Signed: 12/05/2018 11:57:01 AM By: Army Melia Entered By: Army Melia on 12/05/2018 11:11:40

## 2018-12-05 NOTE — Progress Notes (Addendum)
[name], [name] ([id number]) Visit Report for 12/05/2018 Chief Complaint Document Details Patient Name: [name] Date of Service: 12/05/2018 11:00 AM Medical Record Number: [id number] Patient Account Number: [id number] Date of Birth/Sex: 10-May-1965 (53 y.o. F) Treating RN: Harold Barban Primary Care Provider: Alean Rinne Other Clinician: Referring Provider: Alean Rinne Treating Provider/Extender: Melburn Hake, HOYT Weeks in Treatment: 6 Information Obtained from: Patient Chief Complaint Left LE Ulcers Electronic Signature(s) Signed: 12/05/2018 11:13:55 AM By: Worthy Keeler PA-C Entered By: Worthy Keeler on 12/05/2018 11:13:54 [name] ([id number]) -------------------------------------------------------------------------------- HPI Details Patient Name: [name] Date of Service: 12/05/2018 11:00 AM Medical Record Number: [id number] Patient Account Number: [id number] Date of Birth/Sex: May 04, 1965 (53 y.o. F) Treating RN: Harold Barban Primary Care Provider: Alean Rinne Other Clinician: Referring Provider: Alean Rinne Treating Provider/Extender: Melburn Hake, HOYT Weeks in Treatment: 6 History of Present Illness HPI Description: 10/24/2018 on evaluation today patient presents for initial evaluation or clinic concerning issues that she has been having with lymphedema for quite some time. Unfortunately she has several wound openings at this point that secondary to her lymphedema/venous stasis are giving her trouble and leaking quite severely. She also has diabetes along with hypertension and stage III chronic kidney disease. Fortunately there is no signs of active infection at this time. She is going to likely require debridement of the left leg ulcer upon evaluation today just based on what I am seeing. Fortunately there is no wound opening on the right. Overall the patient seems to be doing quite well and again there is no evidence of systemic infection which is good  news. No fevers, chills, nausea, vomiting, or diarrhea. 10/31/2018 on evaluation today patient actually appears to be doing excellent in regard to her lower extremity ulcers. She has been tolerating the dressing changes without complication. Fortunately there is no signs of active infection at this time. She has tolerated 3 layer compression wrap without complication. 11/07/2018 upon evaluation today patient actually appears to be doing very well with regard to her left lower extremity ulcers. She has been tolerating the dressing changes without complication. Fortunately there is no signs of active infection. No fevers, chills, nausea, vomiting, or diarrhea. 11/21/2018 upon evaluation today patient appears to be doing quite well with regard to her lower extremity ulcers. In fact both areas seem to be showing signs of good improvement which is excellent. She is not having as much pain as she has in the past and again has a lot of healing compared to previous visits as well. 12/05/2018 on evaluation today patient presents for follow-up concerning her ongoing issues with her bilateral lower extremity ulcers. The good news is her right lower extremity is showing signs of completely healing at this time which is great news. Fortunately there is no evidence of active infection. On the left she has just a very small area still remaining that is open at this time all in all she is very close to complete closure in my opinion. She does have compression stockings to wear at home. Electronic Signature(s) Signed: 12/05/2018 3:12:52 PM By: Worthy Keeler PA-C Entered By: Worthy Keeler on 12/05/2018 15:12:52 [name] ([id number]) -------------------------------------------------------------------------------- Physical Exam Details Patient Name: [name] Date of Service: 12/05/2018 11:00 AM Medical Record Number: [id number] Patient Account Number: [id number] Date of Birth/Sex: March 01, 1966 (53 y.o.  F) Treating RN: Harold Barban Primary Care Provider: Alean Rinne Other Clinician: Referring Provider: Alean Rinne Treating Provider/Extender: STONE III, HOYT Weeks in Treatment: 6 Constitutional Well-nourished and well-hydrated in no  acute distress. Respiratory normal breathing without difficulty. clear to auscultation bilaterally. Cardiovascular regular rate and rhythm with normal S1, S2. Psychiatric this patient is able to make decisions and demonstrates good insight into disease process. Alert and Oriented x 3. pleasant and cooperative. Notes Patient's wound bed currently showed signs again of minimal opening in regard to left lower extremity which is good news. Fortunately there is no evidence of active infection at this time which is also good news. Her edema seems to be under better control with the compression wraps at this time I think we may want to switch to Tubigrip for the left lower extremity and have her use her home compression for the right since things are doing so well and to be honest the wraps keep sliding down on her. Electronic Signature(s) Signed: 12/05/2018 3:13:35 PM By: Worthy Keeler PA-C Entered By: Worthy Keeler on 12/05/2018 15:13:35 [name] ([id number]) -------------------------------------------------------------------------------- Physician Orders Details Patient Name: [name] Date of Service: 12/05/2018 11:00 AM Medical Record Number: [id number] Patient Account Number: [id number] Date of Birth/Sex: May 21, 1965 (53 y.o. F) Treating RN: Harold Barban Primary Care Provider: Alean Rinne Other Clinician: Referring Provider: Alean Rinne Treating Provider/Extender: Melburn Hake, HOYT Weeks in Treatment: 6 Verbal / Phone Orders: No Diagnosis Coding ICD-10 Coding Code Description I89.0 Lymphedema, not elsewhere classified I87.2 Venous insufficiency (chronic) (peripheral) L97.822 Non-pressure chronic ulcer of other part of left lower  leg with fat layer exposed E11.622 Type 2 diabetes mellitus with other skin ulcer I10 Essential (primary) hypertension N18.3 Chronic kidney disease, stage 3 (moderate) Wound Cleansing Wound #1 Left,Proximal,Lateral Lower Leg o Cleanse wound with mild soap and water Primary Wound Dressing Wound #1 Left,Proximal,Lateral Lower Leg o Silver Collagen Secondary Dressing Wound #1 Left,Proximal,Lateral Lower Leg o Dry Gauze o Conform/Kerlix Follow-up Appointments Wound #1 Left,Proximal,Lateral Lower Leg o Return Appointment in 1 week. Edema Control Wound #1 Left,Proximal,Lateral Lower Leg o Other: - Tugi Grip "F" 2-Layer Left leg Off-Loading Wound #1 Left,Proximal,Lateral Lower Leg o Other: - Elevate leg when sitting Electronic Signature(s) Signed: 12/05/2018 4:31:37 PM By: Harold Barban Signed: 12/05/2018 5:45:18 PM By: Worthy Keeler PA-C Entered By: Harold Barban on 12/05/2018 11:50:19 Bogle, Mitzi ([id number]) NAHJAE, HOEG ([id number]) -------------------------------------------------------------------------------- Problem List Details Patient Name: [name] Date of Service: 12/05/2018 11:00 AM Medical Record Number: [id number] Patient Account Number: [id number] Date of Birth/Sex: 03/08/1966 (53 y.o. F) Treating RN: Harold Barban Primary Care Provider: Alean Rinne Other Clinician: Referring Provider: Alean Rinne Treating Provider/Extender: Melburn Hake, HOYT Weeks in Treatment: 6 Active Problems ICD-10 Evaluated Encounter Code Description Active Date Today Diagnosis I89.0 Lymphedema, not elsewhere classified 10/24/2018 No Yes I87.2 Venous insufficiency (chronic) (peripheral) 10/24/2018 No Yes L97.822 Non-pressure chronic ulcer of other part of left lower leg with 10/24/2018 No Yes fat layer exposed E11.622 Type 2 diabetes mellitus with other skin ulcer 10/24/2018 No Yes I10 Essential (primary) hypertension 10/24/2018 No Yes N18.3 Chronic kidney disease,  stage 3 (moderate) 10/24/2018 No Yes Inactive Problems Resolved Problems Electronic Signature(s) Signed: 12/05/2018 11:13:48 AM By: Worthy Keeler PA-C Entered By: Worthy Keeler on 12/05/2018 11:13:47 Defelice, Malachy [name] ([id number]) -------------------------------------------------------------------------------- Progress Note Details Patient Name: [name] Date of Service: 12/05/2018 11:00 AM Medical Record Number: [id number] Patient Account Number: [id number] Date of Birth/Sex: 1965/09/11 (53 y.o. F) Treating RN: Harold Barban Primary Care Provider: Alean Rinne Other Clinician: Referring Provider: Alean Rinne Treating Provider/Extender: Melburn Hake, HOYT Weeks in Treatment: 6 Subjective Chief Complaint Information obtained from Patient Left LE Ulcers History of Present Illness (  HPI) 10/24/2018 on evaluation today patient presents for initial evaluation or clinic concerning issues that she has been having with lymphedema for quite some time. Unfortunately she has several wound openings at this point that secondary to her lymphedema/venous stasis are giving her trouble and leaking quite severely. She also has diabetes along with hypertension and stage III chronic kidney disease. Fortunately there is no signs of active infection at this time. She is going to likely require debridement of the left leg ulcer upon evaluation today just based on what I am seeing. Fortunately there is no wound opening on the right. Overall the patient seems to be doing quite well and again there is no evidence of systemic infection which is good news. No fevers, chills, nausea, vomiting, or diarrhea. 10/31/2018 on evaluation today patient actually appears to be doing excellent in regard to her lower extremity ulcers. She has been tolerating the dressing changes without complication. Fortunately there is no signs of active infection at this time. She has tolerated 3 layer compression wrap without  complication. 11/07/2018 upon evaluation today patient actually appears to be doing very well with regard to her left lower extremity ulcers. She has been tolerating the dressing changes without complication. Fortunately there is no signs of active infection. No fevers, chills, nausea, vomiting, or diarrhea. 11/21/2018 upon evaluation today patient appears to be doing quite well with regard to her lower extremity ulcers. In fact both areas seem to be showing signs of good improvement which is excellent. She is not having as much pain as she has in the past and again has a lot of healing compared to previous visits as well. 12/05/2018 on evaluation today patient presents for follow-up concerning her ongoing issues with her bilateral lower extremity ulcers. The good news is her right lower extremity is showing signs of completely healing at this time which is great news. Fortunately there is no evidence of active infection. On the left she has just a very small area still remaining that is open at this time all in all she is very close to complete closure in my opinion. She does have compression stockings to wear at home. Patient History Information obtained from Patient. Family History Cancer - Father,Siblings, Diabetes - Mother,Siblings, Heart Disease - Mother,Maternal Grandparents, Hypertension - Mother,Maternal Grandparents, No family history of Hereditary Spherocytosis, Kidney Disease, Lung Disease, Seizures, Stroke, Thyroid Problems, Tuberculosis. Social History Never smoker, Marital Status - Divorced, Alcohol Use - Never, Drug Use - No History, Caffeine Use - Never. Medical History Cardiovascular Patient has history of Congestive Heart Failure, Coronary Artery Disease - CABG 2010, Hypertension, Peripheral Venous Raap, Sunnie ([id number]) Disease Denies history of Angina, Arrhythmia, Deep Vein Thrombosis, Hypotension, Myocardial Infarction, Peripheral Arterial Disease, Phlebitis,  Vasculitis Endocrine Patient has history of Type II Diabetes Denies history of Type I Diabetes Genitourinary Patient has history of 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 Integumentary (Skin) Denies history of History of Burn, History of pressure wounds Neurologic Patient has history of Neuropathy Denies history of Dementia, Quadriplegia, Paraplegia, Seizure Disorder Medical And Surgical History Notes Cardiovascular HLD Review of Systems (ROS) Constitutional Symptoms (General Health) Denies complaints or symptoms of Fatigue, Fever, Chills, Marked Weight Change. Respiratory Denies complaints or symptoms of Chronic or frequent coughs, Shortness of Breath. Cardiovascular Complains or has symptoms of LE edema. Denies complaints or symptoms of Chest pain. Psychiatric Denies complaints or symptoms of Anxiety, Claustrophobia. Objective Constitutional Well-nourished and well-hydrated in no acute distress. Vitals Time Taken: 11:11 AM, Height: 67  in, Weight: 326 lbs, BMI: 51.1, Temperature: 97.9 F, Pulse: 80 bpm, Respiratory Rate: 16 breaths/min, Blood Pressure: 134/61 mmHg. Respiratory normal breathing without difficulty. clear to auscultation bilaterally. Cardiovascular regular rate and rhythm with normal S1, S2. Psychiatric this patient is able to make decisions and demonstrates good insight into disease process. Alert and Oriented x 3. pleasant and cooperative. General Notes: Patient's wound bed currently showed signs again of minimal opening in regard to left lower extremity which is good news. Fortunately there is no evidence of active infection at this time which is also good news. Her edema seems to be Helser, Alleene ([id number]) under better control with the compression wraps at this time I think we may want to switch to Tubigrip for the left lower extremity and have her use her home compression for the right since things are doing so well and to be honest the wraps keep  sliding down on her. Integumentary (Hair, Skin) Wound #1 status is Open. Original cause of wound was Gradually Appeared. The wound is located on the Left,Proximal,Lateral Lower Leg. The wound measures 0.1cm length x 0.1cm width x 0.1cm depth; 0.008cm^2 area and 0.001cm^3 volume. There is Fat Layer (Subcutaneous Tissue) Exposed exposed. There is no tunneling or undermining noted. There is a medium amount of serous drainage noted. The wound margin is flat and intact. There is no granulation within the wound bed. There is no necrotic tissue within the wound bed. Wound #3 status is Healed - Epithelialized. Original cause of wound was Gradually Appeared. The wound is located on the Right,Anterior Lower Leg. The wound measures 0cm length x 0cm width x 0cm depth; 0cm^2 area and 0cm^3 volume. The wound is limited to skin breakdown. There is a small amount of serous drainage noted. The wound margin is flat and intact. There is no granulation within the wound bed. There is no necrotic tissue within the wound bed. Assessment Active Problems ICD-10 Lymphedema, not elsewhere classified Venous insufficiency (chronic) (peripheral) Non-pressure chronic ulcer of other part of left lower leg with fat layer exposed Type 2 diabetes mellitus with other skin ulcer Essential (primary) hypertension Chronic kidney disease, stage 3 (moderate) Plan Wound Cleansing: Wound #1 Left,Proximal,Lateral Lower Leg: Cleanse wound with mild soap and water Primary Wound Dressing: Wound #1 Left,Proximal,Lateral Lower Leg: Silver Collagen Secondary Dressing: Wound #1 Left,Proximal,Lateral Lower Leg: Dry Gauze Conform/Kerlix Follow-up Appointments: Wound #1 Left,Proximal,Lateral Lower Leg: Return Appointment in 1 week. Edema Control: Wound #1 Left,Proximal,Lateral Lower Leg: Other: - Tugi Grip "F" 2-Layer Left leg Off-Loading: Wound #1 Left,Proximal,Lateral Lower Leg: Other: - Elevate leg when sitting Peth, Gicela  ([id number]) 1. I would recommend that we continue with the [facility] for the left lower extremity wound which is still barely open nonetheless I think that is appropriate. 2. I would recommend she use the Tubigrip for the left lower extremity which if it does happen to slide down she will be able to readjust back up. 3. I recommend for her right lower extremity she use her home compression stocking which again I feel like will do well for her in that regard. 4. I still think she needs to elevate her legs whenever sitting just to ensure she does not have any excessive drainage. 5. With regard to her toenails they do need to be trimmed she is unable to do this at home. For that reason I would recommend referral to podiatry in order to see if they can take care of this for her. We will see patient back  for reevaluation in 1 week here in the clinic. If anything worsens or changes patient will contact our office for additional recommendations. Electronic Signature(s) Signed: 12/05/2018 3:14:27 PM By: Worthy Keeler PA-C Entered By: Worthy Keeler on 12/05/2018 15:14:27 Linn, Malachy [name] ([id number]) -------------------------------------------------------------------------------- ROS/PFSH Details Patient Name: [name] Date of Service: 12/05/2018 11:00 AM Medical Record Number: [id number] Patient Account Number: [id number] Date of Birth/Sex: Jan 04, 1966 (53 y.o. F) Treating RN: Harold Barban Primary Care Provider: Alean Rinne Other Clinician: Referring Provider: Alean Rinne Treating Provider/Extender: Melburn Hake, HOYT Weeks in Treatment: 6 Information Obtained From Patient Constitutional Symptoms (General Health) Complaints and Symptoms: Negative for: Fatigue; Fever; Chills; Marked Weight Change Respiratory Complaints and Symptoms: Negative for: Chronic or frequent coughs; Shortness of Breath Cardiovascular Complaints and Symptoms: Positive for: LE edema Negative for: Chest  pain Medical History: Positive for: Congestive Heart Failure; Coronary Artery Disease - CABG 2010; Hypertension; Peripheral Venous Disease Negative for: Angina; Arrhythmia; Deep Vein Thrombosis; Hypotension; Myocardial Infarction; Peripheral Arterial Disease; Phlebitis; Vasculitis Past Medical History Notes: HLD Psychiatric Complaints and Symptoms: Negative for: Anxiety; Claustrophobia Endocrine Medical History: Positive for: Type II Diabetes Negative for: Type I Diabetes Time with diabetes: 1997 Treated with: Insulin Genitourinary Medical History: Positive for: 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 Integumentary (Skin) Medical History: Negative for: History of Burn; History of pressure wounds Guerette, Angla ([id number]) Neurologic Medical History: Positive for: Neuropathy Negative for: Dementia; Quadriplegia; Paraplegia; Seizure Disorder Immunizations Pneumococcal Vaccine: Received Pneumococcal Vaccination: No Implantable Devices None Family and Social History Cancer: Yes - Father,Siblings; Diabetes: Yes - Mother,Siblings; Heart Disease: Yes - Mother,Maternal Grandparents; Hereditary Spherocytosis: No; Hypertension: Yes - Mother,Maternal Grandparents; Kidney Disease: No; Lung Disease: No; Seizures: No; Stroke: No; Thyroid Problems: No; Tuberculosis: No; Never smoker; Marital Status - Divorced; Alcohol Use: Never; Drug Use: No History; Caffeine Use: Never; Financial Concerns: No; Food, Clothing or Shelter Needs: No; Support System Lacking: No; Transportation Concerns: No Physician Affirmation I have reviewed and agree with the above information. Electronic Signature(s) Signed: 12/05/2018 4:31:37 PM By: Harold Barban Signed: 12/05/2018 5:45:18 PM By: Worthy Keeler PA-C Entered By: Worthy Keeler on 12/05/2018 15:13:18 Noga, Malachy [name] ([id number]) -------------------------------------------------------------------------------- SuperBill Details Patient Name: [name] Date of Service: 12/05/2018 Medical Record Number: [id number] Patient Account Number: [id number] Date of Birth/Sex: 1965-04-14 (53 y.o. F) Treating RN: Harold Barban Primary Care Provider: Alean Rinne Other Clinician: Referring Provider: Alean Rinne Treating Provider/Extender: Melburn Hake, HOYT Weeks in Treatment: 6 Diagnosis Coding ICD-10 Codes Code Description I89.0 Lymphedema, not elsewhere classified I87.2 Venous insufficiency (chronic) (peripheral) L97.822 Non-pressure chronic ulcer of other part of left lower leg with fat layer exposed E11.622 Type 2 diabetes mellitus with other skin ulcer I10 Essential (primary) hypertension N18.3 Chronic kidney disease, stage 3 (moderate) Facility Procedures CPT4 Code: [id number] Description: 70962 - WOUND CARE VISIT-LEV 2 EST PT Modifier: Quantity: 1 Physician Procedures CPT4 Code Description: [id number] - WC PHYS LEVEL 4 - EST PT ICD-10 Diagnosis Description I89.0 Lymphedema, not elsewhere classified I87.2 Venous insufficiency (chronic) (peripheral) L97.822 Non-pressure chronic ulcer of other part of left lower leg  wit E11.622 Type 2 diabetes mellitus with other skin ulcer Modifier: h fat layer expos Quantity: 1 ed Electronic Signature(s) Signed: 12/05/2018 3:14:44 PM By: Worthy Keeler PA-C Entered By: Worthy Keeler on 12/05/2018 15:14:44

## 2018-12-12 ENCOUNTER — Other Ambulatory Visit: Payer: Self-pay

## 2018-12-12 ENCOUNTER — Encounter: Payer: Medicare Other | Admitting: Physician Assistant

## 2018-12-12 DIAGNOSIS — E11621 Type 2 diabetes mellitus with foot ulcer: Secondary | ICD-10-CM | POA: Diagnosis not present

## 2018-12-12 NOTE — Progress Notes (Signed)
MATTYE, VERDONE ([id number]) Visit Report for 12/12/2018 Arrival Information Details Patient Name: [name], [name] Date of Service: 12/12/2018 1:00 PM Medical Record Number: [id number] Patient Account Number: [id number] Date of Birth/Sex: [birth year] (53 y.o. F) Treating RN: Army Melia Primary Care Nathaly Dawkins: Alean Rinne Other Clinician: Referring Amelita Risinger: Alean Rinne Treating Hurman Ketelsen/Extender: Melburn Hake, HOYT Weeks in Treatment: 7 Visit Information History Since Last Visit Added or deleted any medications: No Patient Arrived: Ambulatory Any new allergies or adverse reactions: No Arrival Time: 13:14 Had a fall or experienced change in No Accompanied By: self activities of daily living that may affect Transfer Assistance: None risk of falls: Patient Identification Verified: Yes Signs or symptoms of abuse/neglect since last visito No Hospitalized since last visit: No Has Dressing in Place as Prescribed: Yes Pain Present Now: No Electronic Signature(s) Signed: 12/12/2018 1:47:05 PM By: Army Melia Entered By: Army Melia on 12/12/2018 13:14:57 [name], [name] ([id number]) -------------------------------------------------------------------------------- Clinic Level of Care Assessment Details Patient Name: [name] Date of Service: 12/12/2018 1:00 PM Medical Record Number: [id number] Patient Account Number: [id number] Date of Birth/Sex: 09/16/1965 (53 y.o. F) Treating RN: Harold Barban Primary Care Jolana Runkles: Alean Rinne Other Clinician: Referring Kruze Atchley: Alean Rinne Treating Julieth Tugman/Extender: Melburn Hake, HOYT Weeks in Treatment: 7 Clinic Level of Care Assessment Items TOOL 4 Quantity Score []  - Use when only an EandM is performed on FOLLOW-UP visit 0 ASSESSMENTS - Nursing Assessment / Reassessment X - Reassessment of Co-morbidities (includes updates in patient status) 1 10 X- 1 5 Reassessment of Adherence to Treatment Plan ASSESSMENTS - Wound and Skin Assessment /  Reassessment X - Simple Wound Assessment / Reassessment - one wound 1 5 []  - 0 Complex Wound Assessment / Reassessment - multiple wounds []  - 0 Dermatologic / Skin Assessment (not related to wound area) ASSESSMENTS - Focused Assessment []  - Circumferential Edema Measurements - multi extremities 0 []  - 0 Nutritional Assessment / Counseling / Intervention []  - 0 Lower Extremity Assessment (monofilament, tuning fork, pulses) []  - 0 Peripheral Arterial Disease Assessment (using hand held doppler) ASSESSMENTS - Ostomy and/or Continence Assessment and Care []  - Incontinence Assessment and Management 0 []  - 0 Ostomy Care Assessment and Management (repouching, etc.) PROCESS - Coordination of Care X - Simple Patient / Family Education for ongoing care 1 15 []  - 0 Complex (extensive) Patient / Family Education for ongoing care []  - 0 Staff obtains Programmer, systems, Records, Test Results / Process Orders []  - 0 Staff telephones HHA, Nursing Homes / Clarify orders / etc []  - 0 Routine Transfer to another Facility (non-emergent condition) []  - 0 Routine Hospital Admission (non-emergent condition) []  - 0 New Admissions / Biomedical engineer / Ordering NPWT, Apligraf, etc. []  - 0 Emergency Hospital Admission (emergent condition) X- 1 10 Simple Discharge Coordination [name], [name] ([id number]) []  - 0 Complex (extensive) Discharge Coordination PROCESS - Special Needs []  - Pediatric / Minor Patient Management 0 []  - 0 Isolation Patient Management []  - 0 Hearing / Language / Visual special needs []  - 0 Assessment of Community assistance (transportation, D/C planning, etc.) []  - 0 Additional assistance / Altered mentation []  - 0 Support Surface(s) Assessment (bed, cushion, seat, etc.) INTERVENTIONS - Wound Cleansing / Measurement X - Simple Wound Cleansing - one wound 1 5 []  - 0 Complex Wound Cleansing - multiple wounds []  - 0 Wound Imaging (photographs - any number of wounds) []  -  0 Wound Tracing (instead of photographs) X- 1 5 Simple Wound Measurement - one wound []  - 0 Complex Wound Measurement -  multiple wounds INTERVENTIONS - Wound Dressings X - Small Wound Dressing one or multiple wounds 1 10 []  - 0 Medium Wound Dressing one or multiple wounds []  - 0 Large Wound Dressing one or multiple wounds []  - 0 Application of Medications - topical []  - 0 Application of Medications - injection INTERVENTIONS - Miscellaneous []  - External ear exam 0 []  - 0 Specimen Collection (cultures, biopsies, blood, body fluids, etc.) []  - 0 Specimen(s) / Culture(s) sent or taken to Lab for analysis []  - 0 Patient Transfer (multiple staff / Civil Service fast streamer / Similar devices) []  - 0 Simple Staple / Suture removal (25 or less) []  - 0 Complex Staple / Suture removal (26 or more) []  - 0 Hypo / Hyperglycemic Management (close monitor of Blood Glucose) []  - 0 Ankle / Brachial Index (ABI) - do not check if billed separately X- 1 5 Vital Signs [name], [name] ([id number]) Has the patient been seen at the hospital within the last three years: Yes Total Score: 70 Level Of Care: New/Established - Level 2 Electronic Signature(s) Signed: 12/12/2018 4:36:39 PM By: Harold Barban Entered By: Harold Barban on 12/12/2018 13:22:29 [name], [name] ([id number]) -------------------------------------------------------------------------------- Encounter Discharge Information Details Patient Name: [name] Date of Service: 12/12/2018 1:00 PM Medical Record Number: [id number] Patient Account Number: [id number] Date of Birth/Sex: 09-08-1965 (53 y.o. F) Treating RN: Army Melia Primary Care Corazon Nickolas: Alean Rinne Other Clinician: Referring Hollis Tuller: Alean Rinne Treating Katelee Schupp/Extender: Melburn Hake, HOYT Weeks in Treatment: 7 Encounter Discharge Information Items Discharge Condition: Stable Ambulatory Status: Ambulatory Discharge Destination: Home Transportation: Private  Auto Accompanied By: self Schedule Follow-up Appointment: Yes Clinical Summary of Care: Electronic Signature(s) Signed: 12/12/2018 1:47:05 PM By: Army Melia Entered By: Army Melia on 12/12/2018 13:34:33 [name], [name] ([id number]) -------------------------------------------------------------------------------- Lower Extremity Assessment Details Patient Name: [name] Date of Service: 12/12/2018 1:00 PM Medical Record Number: [id number] Patient Account Number: [id number] Date of Birth/Sex: May 02, 1965 (53 y.o. F) Treating RN: Army Melia Primary Care Faven Watterson: Alean Rinne Other Clinician: Referring Joye Wesenberg: Alean Rinne Treating Sumeya Yontz/Extender: STONE III, HOYT Weeks in Treatment: 7 Edema Assessment Assessed: [Left: No] [Right: No] Edema: [Left: N] [Right: o] Vascular Assessment Pulses: Dorsalis Pedis Palpable: [Left:Yes] Electronic Signature(s) Signed: 12/12/2018 1:47:05 PM By: Army Melia Entered By: Army Melia on 12/12/2018 13:17:02 [place], [place] ([id number]) -------------------------------------------------------------------------------- Multi Wound Chart Details Patient Name: [name] Date of Service: 12/12/2018 1:00 PM Medical Record Number: [id number] Patient Account Number: [id number] Date of Birth/Sex: 02/13/1966 (53 y.o. F) Treating RN: Harold Barban Primary Care Carliyah Cotterman: Alean Rinne Other Clinician: Referring Tyesha Joffe: Alean Rinne Treating Lynsi Dooner/Extender: STONE III, HOYT Weeks in Treatment: 7 Vital Signs Height(in): 67 Pulse(bpm): 29 Weight(lbs): 326 Blood Pressure(mmHg): 169/60 Body Mass Index(BMI): 51 Temperature(F): 99.0 Respiratory Rate 16 (breaths/min): Photos: [N/A:N/A] Wound Location: Left Lower Leg - Lateral, N/A N/A Proximal Wounding Event: Gradually Appeared N/A N/A Primary Etiology: Venous Leg Ulcer N/A N/A Comorbid History: Congestive Heart Failure, N/A N/A Coronary Artery Disease, Hypertension,  Peripheral Venous Disease, Type II Diabetes, End Stage Renal Disease, Neuropathy Date Acquired: 10/03/2018 N/A N/A Weeks of Treatment: 7 N/A N/A Wound Status: Open N/A N/A Measurements L x W x D 0.1x0.1x0.1 N/A N/A (cm) Area (cm) : 0.008 N/A N/A Volume (cm) : 0.001 N/A N/A % Reduction in Area: 96.90% N/A N/A % Reduction in Volume: 96.20% N/A N/A Classification: Full Thickness Without N/A N/A Exposed Support Structures Exudate Amount: Medium N/A N/A Exudate Type: Serous N/A N/A Exudate Color: amber N/A N/A Wound Margin: Flat and Intact N/A N/A Granulation Amount:  None Present (0%) N/A N/A Necrotic Amount: None Present (0%) N/A N/A Exposed Structures: Fat Layer (Subcutaneous N/A N/A Tissue) Exposed: Yes [name], [name] ([id number]) Fascia: No Tendon: No Muscle: No Joint: No Bone: No Epithelialization: Large (67-100%) N/A N/A Treatment Notes Electronic Signature(s) Signed: 12/12/2018 4:36:39 PM By: Harold Barban Entered By: Harold Barban on 12/12/2018 13:21:25 [name], [name] ([id number]) -------------------------------------------------------------------------------- Multi-Disciplinary Care Plan Details Patient Name: [name] Date of Service: 12/12/2018 1:00 PM Medical Record Number: [id number] Patient Account Number: [id number] Date of Birth/Sex: April 19, 1965 (53 y.o. F) Treating RN: Harold Barban Primary Care Tayler Heiden: Alean Rinne Other Clinician: Referring Vola Beneke: Alean Rinne Treating Sebastin Perlmutter/Extender: Melburn Hake, HOYT Weeks in Treatment: 7 Active Inactive Venous Leg Ulcer Nursing Diagnoses: Knowledge deficit related to disease process and management Goals: Patient/caregiver will verbalize understanding of disease process and disease management Date Initiated: 10/24/2018 Target Resolution Date: 11/24/2018 Goal Status: Active Interventions: Assess peripheral edema status every visit. Notes: Wound/Skin Impairment Nursing Diagnoses: Impaired tissue  integrity Knowledge deficit related to ulceration/compromised skin integrity Goals: Ulcer/skin breakdown will have a volume reduction of 30% by week 4 Date Initiated: 10/24/2018 Target Resolution Date: 11/24/2018 Goal Status: Active Interventions: Assess patient/caregiver ability to obtain necessary supplies Assess patient/caregiver ability to perform ulcer/skin care regimen upon admission and as needed Assess ulceration(s) every visit Provide education on ulcer and skin care Notes: Electronic Signature(s) Signed: 12/12/2018 4:36:39 PM By: Harold Barban Entered By: Harold Barban on 12/12/2018 13:21:17 [name], [name] ([id number]) -------------------------------------------------------------------------------- Pain Assessment Details Patient Name: [name] Date of Service: 12/12/2018 1:00 PM Medical Record Number: [id number] Patient Account Number: [id number] Date of Birth/Sex: 02-17-1966 (53 y.o. F) Treating RN: Army Melia Primary Care Keith Felten: Alean Rinne Other Clinician: Referring Patsy Varma: Alean Rinne Treating Treva Huyett/Extender: Melburn Hake, HOYT Weeks in Treatment: 7 Active Problems Location of Pain Severity and Description of Pain Patient Has Paino No Site Locations Pain Management and Medication Current Pain Management: Electronic Signature(s) Signed: 12/12/2018 1:47:05 PM By: Army Melia Entered By: Army Melia on 12/12/2018 13:15:02 [name], [name] ([id number]) -------------------------------------------------------------------------------- Patient/Caregiver Education Details Patient Name: [name] Date of Service: 12/12/2018 1:00 PM Medical Record Number: [id number] Patient Account Number: [id number] Date of Birth/Gender: 1965/10/23 (53 y.o. F) Treating RN: Harold Barban Primary Care Physician: Alean Rinne Other Clinician: Referring Physician: Alean Rinne Treating Physician/Extender: Sharalyn Ink in Treatment: 7 Education  Assessment Education Provided To: Patient Education Topics Provided Wound/Skin Impairment: Handouts: Caring for Your Ulcer Methods: Demonstration, Explain/Verbal Responses: State content correctly Electronic Signature(s) Signed: 12/12/2018 4:36:39 PM By: Harold Barban Entered By: Harold Barban on 12/12/2018 13:21:55 [name], [name] ([id number]) -------------------------------------------------------------------------------- Wound Assessment Details Patient Name: [name] Date of Service: 12/12/2018 1:00 PM Medical Record Number: [id number] Patient Account Number: [id number] Date of Birth/Sex: 02/23/1966 (53 y.o. F) Treating RN: Army Melia Primary Care Jaelin Devincentis: Alean Rinne Other Clinician: Referring Doralene Glanz: Alean Rinne Treating Dayveon Halley/Extender: Melburn Hake, HOYT Weeks in Treatment: 7 Wound Status Wound Number: 1 Primary Venous Leg Ulcer Etiology: Wound Location: Left Lower Leg - Lateral, Proximal Wound Open Wounding Event: Gradually Appeared Status: Date Acquired: 10/03/2018 Comorbid Congestive Heart Failure, Coronary Artery Weeks Of Treatment: 7 History: Disease, Hypertension, Peripheral Venous Clustered Wound: No Disease, Type II Diabetes, End Stage Renal Disease, Neuropathy Photos Wound Measurements Length: (cm) 0.1 Width: (cm) 0.1 Depth: (cm) 0.1 Area: (cm) 0.008 Volume: (cm) 0.001 % Reduction in Area: 96.9% % Reduction in Volume: 96.2% Epithelialization: Large (67-100%) Tunneling: No Undermining: No Wound Description Full Thickness Without Exposed Support Classification: Structures Wound Margin: Flat and Intact Exudate Medium Amount:  Exudate Type: Serous Exudate Color: amber Foul Odor After Cleansing: No Slough/Fibrino No Wound Bed Granulation Amount: None Present (0%) Exposed Structure Necrotic Amount: None Present (0%) Fascia Exposed: No Fat Layer (Subcutaneous Tissue) Exposed: Yes Tendon Exposed: No Muscle Exposed: No Joint  Exposed: No Bone Exposed: No Hamric, [name] ([id number]) Treatment Notes Wound #1 (Left, Proximal, Lateral Lower Leg) Notes silver collagen, dry gauze, conform, 2-Layer Tubi Grip "F" Left leg only Electronic Signature(s) Signed: 12/12/2018 1:47:05 PM By: Army Melia Entered By: Army Melia on 12/12/2018 13:16:41 [name], [name] ([id number]) -------------------------------------------------------------------------------- Vitals Details Patient Name: [name] Date of Service: 12/12/2018 1:00 PM Medical Record Number: [id number] Patient Account Number: [id number] Date of Birth/Sex: December 12, 1965 (53 y.o. F) Treating RN: Army Melia Primary Care Maryfrances Portugal: Alean Rinne Other Clinician: Referring Kalysta Kneisley: Alean Rinne Treating Toshiye Kever/Extender: Melburn Hake, HOYT Weeks in Treatment: 7 Vital Signs Time Taken: 13:15 Temperature (F): 99.0 Height (in): 67 Pulse (bpm): 84 Weight (lbs): 326 Respiratory Rate (breaths/min): 16 Body Mass Index (BMI): 51.1 Blood Pressure (mmHg): 169/60 Reference Range: 80 - 120 mg / dl Electronic Signature(s) Signed: 12/12/2018 1:47:05 PM By: Army Melia Entered By: Army Melia on 12/12/2018 13:15:47

## 2018-12-12 NOTE — Progress Notes (Addendum)
[name], [name] ([id number]) Visit Report for 12/12/2018 Chief Complaint Document Details Patient Name: [name], [name] Date of Service: 12/12/2018 1:00 PM Medical Record Number: [id number] Patient Account Number: [id number] Date of Birth/Sex: [birth year] (53 y.o. F) Treating RN: Harold Barban Primary Care Provider: Alean Rinne Other Clinician: Referring Provider: Alean Rinne Treating Provider/Extender: Melburn Hake, HOYT Weeks in Treatment: 7 Information Obtained from: Patient Chief Complaint Left LE Ulcers Electronic Signature(s) Signed: 12/12/2018 1:19:23 PM By: Worthy Keeler PA-C Entered By: Worthy Keeler [name] 12/12/2018 13:19:23 [name], [name] ([id number]) -------------------------------------------------------------------------------- HPI Details Patient Name: [name] Date of Service: 12/12/2018 1:00 PM Medical Record Number: [id number] Patient Account Number: [id number] Date of Birth/Sex: [birth year] (53 y.o. F) Treating RN: Harold Barban Primary Care Provider: Alean Rinne Other Clinician: Referring Provider: Alean Rinne Treating Provider/Extender: Melburn Hake, HOYT Weeks in Treatment: 7 History of Present Illness HPI Description: 10/24/2018 [name] evaluation today patient presents for initial evaluation or clinic concerning issues that she has been having with lymphedema for quite some time. Unfortunately she has several wound openings at this point that secondary to her lymphedema/venous stasis are giving her trouble and leaking quite severely. She also has diabetes along with hypertension and stage III chronic kidney disease. Fortunately there is no signs of active infection at this time. She is going to likely require debridement of the left leg ulcer upon evaluation today just based [name] what I am seeing. Fortunately there is no wound opening [name] the right. Overall the patient seems to be doing quite well and again there is no evidence of systemic infection which is good news.  No fevers, chills, nausea, vomiting, or diarrhea. 10/31/2018 [name] evaluation today patient actually appears to be doing excellent in regard to her lower extremity ulcers. She has been tolerating the dressing changes without complication. Fortunately there is no signs of active infection at this time. She has tolerated 3 layer compression wrap without complication. 11/07/2018 upon evaluation today patient actually appears to be doing very well with regard to her left lower extremity ulcers. She has been tolerating the dressing changes without complication. Fortunately there is no signs of active infection. No fevers, chills, nausea, vomiting, or diarrhea. 11/21/2018 upon evaluation today patient appears to be doing quite well with regard to her lower extremity ulcers. In fact both areas seem to be showing signs of good improvement which is excellent. She is not having as much pain as she has in the past and again has a lot of healing compared to previous visits as well. 12/05/2018 [name] evaluation today patient presents for follow-up concerning her ongoing issues with her bilateral lower extremity ulcers. The good news is her right lower extremity is showing signs of completely healing at this time which is great news. Fortunately there is no evidence of active infection. [name] the left she has just a very small area still remaining that is open at this time all in all she is very close to complete closure in my opinion. She does have compression stockings to wear at home. 12/12/2018 [name] evaluation today patient appears to be doing excellent in regard to her left lateral lower extremity ulcer. She has been tolerating the dressing changes without complication. Fortunately there is no signs of active infection at this time. In fact this appears to be pretty much healed at this point although again she is not 100% today. No fevers, chills, nausea, vomiting, or diarrhea. Electronic Signature(s) Signed: 12/12/2018  2:07:59 PM By: Worthy Keeler PA-C Entered By: Worthy Keeler  [name] 12/12/2018 14:07:59 CORNELL, GABER ([id number]) -------------------------------------------------------------------------------- Physical Exam Details Patient Name: ELIZABELLE, FITE Date of Service: 12/12/2018 1:00 PM Medical Record Number: [id number] Patient Account Number: [id number] Date of Birth/Sex: 10-Sep-1965 (53 y.o. F) Treating RN: Harold Barban Primary Care Provider: Alean Rinne Other Clinician: Referring Provider: Alean Rinne Treating Provider/Extender: STONE III, HOYT Weeks in Treatment: 7 Constitutional Well-nourished and well-hydrated in no acute distress. Respiratory normal breathing without difficulty. clear to auscultation bilaterally. Cardiovascular regular rate and rhythm with normal S1, S2. Psychiatric this patient is able to make decisions and demonstrates good insight into disease process. Alert and Oriented x 3. pleasant and cooperative. Notes Patient's wound bed currently showed signs of good granulation at this time with excellent epithelization over top of. In fact I am not even sure today that she is not completely healed. Nonetheless if she does have new skin covering this which I believe she does it is brand-new and I think still she would Electronic Signature(s) Signed: 12/12/2018 2:08:36 PM By: Worthy Keeler PA-C Entered By: Worthy Keeler [name] 12/12/2018 14:08:36 [name], [name] ([id number]) -------------------------------------------------------------------------------- Physician Orders Details Patient Name: [name] Date of Service: 12/12/2018 1:00 PM Medical Record Number: [id number] Patient Account Number: [id number] Date of Birth/Sex: [birth year] (53 y.o. F) Treating RN: Harold Barban Primary Care Provider: Alean Rinne Other Clinician: Referring Provider: Alean Rinne Treating Provider/Extender: Melburn Hake, HOYT Weeks in Treatment: 7 Verbal / Phone Orders: No Diagnosis  Coding ICD-10 Coding Code Description I89.0 Lymphedema, not elsewhere classified I87.2 Venous insufficiency (chronic) (peripheral) L97.822 Non-pressure chronic ulcer of other part of left lower leg with fat layer exposed E11.622 Type 2 diabetes mellitus with other skin ulcer I10 Essential (primary) hypertension N18.3 Chronic kidney disease, stage 3 (moderate) Wound Cleansing Wound #1 Left,Proximal,Lateral Lower Leg o Cleanse wound with mild soap and water Primary Wound Dressing Wound #1 Left,Proximal,Lateral Lower Leg o Silver Collagen Secondary Dressing Wound #1 Left,Proximal,Lateral Lower Leg o Dry Gauze o Conform/Kerlix Follow-up Appointments Wound #1 Left,Proximal,Lateral Lower Leg o Return Appointment in 1 week. Edema Control Wound #1 Left,Proximal,Lateral Lower Leg o Other: - Tugi Grip "F" 2-Layer Left leg Off-Loading Wound #1 Left,Proximal,Lateral Lower Leg o Other: - Elevate leg when sitting Electronic Signature(s) Signed: 12/12/2018 4:36:39 PM By: Harold Barban Signed: 12/12/2018 4:43:35 PM By: Worthy Keeler PA-C Entered By: Harold Barban [name] 12/12/2018 13:25:59 [name], [name] ([id number]) [name], [name] ([id number]) -------------------------------------------------------------------------------- Problem List Details Patient Name: [name] Date of Service: 12/12/2018 1:00 PM Medical Record Number: [id number] Patient Account Number: [id number] Date of Birth/Sex: November 14, 1965 (53 y.o. F) Treating RN: Harold Barban Primary Care Provider: Alean Rinne Other Clinician: Referring Provider: Alean Rinne Treating Provider/Extender: Melburn Hake, HOYT Weeks in Treatment: 7 Active Problems ICD-10 Evaluated Encounter Code Description Active Date Today Diagnosis I89.0 Lymphedema, not elsewhere classified 10/24/2018 No Yes I87.2 Venous insufficiency (chronic) (peripheral) 10/24/2018 No Yes L97.822 Non-pressure chronic ulcer of other part of left lower leg  with 10/24/2018 No Yes fat layer exposed E11.622 Type 2 diabetes mellitus with other skin ulcer 10/24/2018 No Yes I10 Essential (primary) hypertension 10/24/2018 No Yes N18.3 Chronic kidney disease, stage 3 (moderate) 10/24/2018 No Yes Inactive Problems Resolved Problems Electronic Signature(s) Signed: 12/12/2018 1:19:15 PM By: Worthy Keeler PA-C Entered By: Worthy Keeler [name] 12/12/2018 13:19:15 [name], [name] ([id number]) -------------------------------------------------------------------------------- Progress Note Details Patient Name: [name] Date of Service: 12/12/2018 1:00 PM Medical Record Number: [id number] Patient Account Number: [id number] Date of Birth/Sex: 1965-11-05 (53 y.o. F) Treating RN: Harold Barban Primary Care Provider: Alean Rinne Other  Clinician: Referring Provider: Alean Rinne Treating Provider/Extender: Melburn Hake, HOYT Weeks in Treatment: 7 Subjective Chief Complaint Information obtained from Patient Left LE Ulcers History of Present Illness (HPI) 10/24/2018 [name] evaluation today patient presents for initial evaluation or clinic concerning issues that she has been having with lymphedema for quite some time. Unfortunately she has several wound openings at this point that secondary to her lymphedema/venous stasis are giving her trouble and leaking quite severely. She also has diabetes along with hypertension and stage III chronic kidney disease. Fortunately there is no signs of active infection at this time. She is going to likely require debridement of the left leg ulcer upon evaluation today just based [name] what I am seeing. Fortunately there is no wound opening [name] the right. Overall the patient seems to be doing quite well and again there is no evidence of systemic infection which is good news. No fevers, chills, nausea, vomiting, or diarrhea. 10/31/2018 [name] evaluation today patient actually appears to be doing excellent in regard to her lower extremity ulcers. She  has been tolerating the dressing changes without complication. Fortunately there is no signs of active infection at this time. She has tolerated 3 layer compression wrap without complication. 11/07/2018 upon evaluation today patient actually appears to be doing very well with regard to her left lower extremity ulcers. She has been tolerating the dressing changes without complication. Fortunately there is no signs of active infection. No fevers, chills, nausea, vomiting, or diarrhea. 11/21/2018 upon evaluation today patient appears to be doing quite well with regard to her lower extremity ulcers. In fact both areas seem to be showing signs of good improvement which is excellent. She is not having as much pain as she has in the past and again has a lot of healing compared to previous visits as well. 12/05/2018 [name] evaluation today patient presents for follow-up concerning her ongoing issues with her bilateral lower extremity ulcers. The good news is her right lower extremity is showing signs of completely healing at this time which is great news. Fortunately there is no evidence of active infection. [name] the left she has just a very small area still remaining that is open at this time all in all she is very close to complete closure in my opinion. She does have compression stockings to wear at home. 12/12/2018 [name] evaluation today patient appears to be doing excellent in regard to her left lateral lower extremity ulcer. She has been tolerating the dressing changes without complication. Fortunately there is no signs of active infection at this time. In fact this appears to be pretty much healed at this point although again she is not 100% today. No fevers, chills, nausea, vomiting, or diarrhea. Patient History Information obtained from Patient. Family History Cancer - Father,Siblings, Diabetes - Mother,Siblings, Heart Disease - Mother,Maternal Grandparents, Hypertension - Mother,Maternal Grandparents, No  family history of Hereditary Spherocytosis, Kidney Disease, Lung Disease, Seizures, Stroke, Thyroid Problems, Tuberculosis. Social History WILMARIE, SPARLIN ([id number]) Never smoker, Marital Status - Divorced, Alcohol Use - Never, Drug Use - No History, Caffeine Use - Never. Medical History Cardiovascular Patient has history of Congestive Heart Failure, Coronary Artery Disease - CABG 2010, Hypertension, Peripheral Venous Disease Denies history of Angina, Arrhythmia, Deep Vein Thrombosis, Hypotension, Myocardial Infarction, Peripheral Arterial Disease, Phlebitis, Vasculitis Endocrine Patient has history of Type II Diabetes Denies history of Type I Diabetes Genitourinary Patient has history of 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 Integumentary (Skin) Denies history of History of Burn, History of pressure  wounds Neurologic Patient has history of Neuropathy Denies history of Dementia, Quadriplegia, Paraplegia, Seizure Disorder Medical And Surgical History Notes Cardiovascular HLD Review of Systems (ROS) Constitutional Symptoms (General Health) Denies complaints or symptoms of Fatigue, Fever, Chills, Marked Weight Change. Respiratory Denies complaints or symptoms of Chronic or frequent coughs, Shortness of Breath. Cardiovascular Complains or has symptoms of LE edema. Denies complaints or symptoms of Chest pain. Psychiatric Denies complaints or symptoms of Anxiety, Claustrophobia. Objective Constitutional Well-nourished and well-hydrated in no acute distress. Vitals Time Taken: 1:15 PM, Height: 67 in, Weight: 326 lbs, BMI: 51.1, Temperature: 99.0 F, Pulse: 84 bpm, Respiratory Rate: 16 breaths/min, Blood Pressure: 169/60 mmHg. Respiratory normal breathing without difficulty. clear to auscultation bilaterally. Cardiovascular regular rate and rhythm with normal S1, S2. Psychiatric this patient is able to make decisions and demonstrates good insight into disease process. Alert and  Oriented x 3. pleasant [name], [name] ([id number]) and cooperative. General Notes: Patient's wound bed currently showed signs of good granulation at this time with excellent epithelization over top of. In fact I am not even sure today that she is not completely healed. Nonetheless if she does have new skin covering this which I believe she does it is brand-new and I think still she would Integumentary (Hair, Skin) Wound #1 status is Open. Original cause of wound was Gradually Appeared. The wound is located [name] the Left,Proximal,Lateral Lower Leg. The wound measures 0.1cm length x 0.1cm width x 0.1cm depth; 0.008cm^2 area and 0.001cm^3 volume. There is Fat Layer (Subcutaneous Tissue) Exposed exposed. There is no tunneling or undermining noted. There is a medium amount of serous drainage noted. The wound margin is flat and intact. There is no granulation within the wound bed. There is no necrotic tissue within the wound bed. Assessment Active Problems ICD-10 Lymphedema, not elsewhere classified Venous insufficiency (chronic) (peripheral) Non-pressure chronic ulcer of other part of left lower leg with fat layer exposed Type 2 diabetes mellitus with other skin ulcer Essential (primary) hypertension Chronic kidney disease, stage 3 (moderate) Plan Wound Cleansing: Wound #1 Left,Proximal,Lateral Lower Leg: Cleanse wound with mild soap and water Primary Wound Dressing: Wound #1 Left,Proximal,Lateral Lower Leg: Silver Collagen Secondary Dressing: Wound #1 Left,Proximal,Lateral Lower Leg: Dry Gauze Conform/Kerlix Follow-up Appointments: Wound #1 Left,Proximal,Lateral Lower Leg: Return Appointment in 1 week. Edema Control: Wound #1 Left,Proximal,Lateral Lower Leg: Other: - Tugi Grip "F" 2-Layer Left leg Off-Loading: Wound #1 Left,Proximal,Lateral Lower Leg: Other: - Elevate leg when sitting [name], [name] ([id number]) 1. I would recommend that we continue with the current orders which  includes a silver collagen to the wound bed and then subsequently we will cover this area with dry gauze and Kerlix followed by Tubigrip. 2. I would recommend still she elevate her legs as much as possible. 3. We also gave her the measurements again today for what she needs in order to call into elastic therapy in order to get custom fitted compression stockings when she does heal. She is in agreement with that plan. We will likely go ahead and continue with the Tubigrip next week however even if she has healed until she has a chance [name] the 29th of this month to get in with Dr. Amalia Hailey to have her toenails trimmed once they are trimmed she will be able to wear the compression stockings without complication. We will see patient back for reevaluation in 1 week here in the clinic. If anything worsens or changes patient will contact our office for additional recommendations. Electronic Signature(s) Signed: 12/12/2018 2:09:46 PM  By: Worthy Keeler PA-C Entered By: Worthy Keeler [name] 12/12/2018 14:09:45 [name], [name] ([id number]) -------------------------------------------------------------------------------- ROS/PFSH Details Patient Name: [name] Date of Service: 12/12/2018 1:00 PM Medical Record Number: [id number] Patient Account Number: [id number] Date of Birth/Sex: 1965/08/17 (53 y.o. F) Treating RN: Harold Barban Primary Care Provider: Alean Rinne Other Clinician: Referring Provider: Alean Rinne Treating Provider/Extender: Melburn Hake, HOYT Weeks in Treatment: 7 Information Obtained From Patient Constitutional Symptoms (General Health) Complaints and Symptoms: Negative for: Fatigue; Fever; Chills; Marked Weight Change Respiratory Complaints and Symptoms: Negative for: Chronic or frequent coughs; Shortness of Breath Cardiovascular Complaints and Symptoms: Positive for: LE edema Negative for: Chest pain Medical History: Positive for: Congestive Heart Failure; Coronary Artery  Disease - CABG 2010; Hypertension; Peripheral Venous Disease Negative for: Angina; Arrhythmia; Deep Vein Thrombosis; Hypotension; Myocardial Infarction; Peripheral Arterial Disease; Phlebitis; Vasculitis Past Medical History Notes: HLD Psychiatric Complaints and Symptoms: Negative for: Anxiety; Claustrophobia Endocrine Medical History: Positive for: Type II Diabetes Negative for: Type I Diabetes Time with diabetes: 1997 Treated with: Insulin Genitourinary Medical History: Positive for: 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 Integumentary (Skin) Medical History: Negative for: History of Burn; History of pressure wounds [name], [name] ([id number]) Neurologic Medical History: Positive for: Neuropathy Negative for: Dementia; Quadriplegia; Paraplegia; Seizure Disorder Immunizations Pneumococcal Vaccine: Received Pneumococcal Vaccination: No Implantable Devices None Family and Social History Cancer: Yes - Father,Siblings; Diabetes: Yes - Mother,Siblings; Heart Disease: Yes - Mother,Maternal Grandparents; Hereditary Spherocytosis: No; Hypertension: Yes - Mother,Maternal Grandparents; Kidney Disease: No; Lung Disease: No; Seizures: No; Stroke: No; Thyroid Problems: No; Tuberculosis: No; Never smoker; Marital Status - Divorced; Alcohol Use: Never; Drug Use: No History; Caffeine Use: Never; Financial Concerns: No; Food, Clothing or Shelter Needs: No; Support System Lacking: No; Transportation Concerns: No Physician Affirmation I have reviewed and agree with the above information. Electronic Signature(s) Signed: 12/12/2018 4:36:39 PM By: Harold Barban Signed: 12/12/2018 4:43:35 PM By: Worthy Keeler PA-C Entered By: Worthy Keeler [name] 12/12/2018 14:08:26 [name], [name] ([id number]) -------------------------------------------------------------------------------- SuperBill Details Patient Name: [name] Date of Service: 12/12/2018 Medical Record Number: [id number] Patient  Account Number: [id number] Date of Birth/Sex: 1966-03-03 (53 y.o. F) Treating RN: Harold Barban Primary Care Provider: Alean Rinne Other Clinician: Referring Provider: Alean Rinne Treating Provider/Extender: Melburn Hake, HOYT Weeks in Treatment: 7 Diagnosis Coding ICD-10 Codes Code Description I89.0 Lymphedema, not elsewhere classified I87.2 Venous insufficiency (chronic) (peripheral) L97.822 Non-pressure chronic ulcer of other part of left lower leg with fat layer exposed E11.622 Type 2 diabetes mellitus with other skin ulcer I10 Essential (primary) hypertension N18.3 Chronic kidney disease, stage 3 (moderate) Facility Procedures CPT4 Code: [id number] Description: 59935 - WOUND CARE VISIT-LEV 2 EST PT Modifier: Quantity: 1 Physician Procedures CPT4 Code Description: [id number] 99214 - WC PHYS LEVEL 4 - EST PT ICD-10 Diagnosis Description I89.0 Lymphedema, not elsewhere classified I87.2 Venous insufficiency (chronic) (peripheral) L97.822 Non-pressure chronic ulcer of other part of left lower leg  wit E11.622 Type 2 diabetes mellitus with other skin ulcer Modifier: h fat layer expos Quantity: 1 ed Electronic Signature(s) Signed: 12/12/2018 2:09:58 PM By: Worthy Keeler PA-C Entered By: Worthy Keeler [name] 12/12/2018 14:09:57

## 2018-12-19 ENCOUNTER — Other Ambulatory Visit: Payer: Self-pay

## 2018-12-19 ENCOUNTER — Encounter: Payer: Medicare Other | Admitting: Physician Assistant

## 2018-12-19 DIAGNOSIS — E11621 Type 2 diabetes mellitus with foot ulcer: Secondary | ICD-10-CM | POA: Diagnosis not present

## 2018-12-19 NOTE — Progress Notes (Addendum)
[name], [name] ([id number]) Visit Report for 12/19/2018 Chief Complaint Document Details Patient Name: [name], [name] Date of Service: 12/19/2018 1:00 PM Medical Record Number: [id number] Patient Account Number: [id number] Date of Birth/Sex: 19-Jul-1965 (53 y.o. F) Treating RN: Harold Barban Primary Care Provider: Alean Rinne Other Clinician: Referring Provider: Alean Rinne Treating Provider/Extender: Melburn Hake, HOYT Weeks in Treatment: 8 Information Obtained from: Patient Chief Complaint Left LE Ulcers Electronic Signature(s) Signed: 12/19/2018 1:24:49 PM By: Worthy Keeler PA-C Entered By: Worthy Keeler on 12/19/2018 13:24:49 [name], [name] ([id number]) -------------------------------------------------------------------------------- HPI Details Patient Name: [name] Date of Service: 12/19/2018 1:00 PM Medical Record Number: [id number] Patient Account Number: [id number] Date of Birth/Sex: 1965-08-11 (53 y.o. F) Treating RN: Harold Barban Primary Care Provider: Alean Rinne Other Clinician: Referring Provider: Alean Rinne Treating Provider/Extender: Melburn Hake, HOYT Weeks in Treatment: 8 History of Present Illness HPI Description: 10/24/2018 on evaluation today patient presents for initial evaluation or clinic concerning issues that she has been having with lymphedema for quite some time. Unfortunately she has several wound openings at this point that secondary to her lymphedema/venous stasis are giving her trouble and leaking quite severely. She also has diabetes along with hypertension and stage III chronic kidney disease. Fortunately there is no signs of active infection at this time. She is going to likely require debridement of the left leg ulcer upon evaluation today just based on what I am seeing. Fortunately there is no wound opening on the right. Overall the patient seems to be doing quite well and again there is no evidence of systemic infection which is good news.  No fevers, chills, nausea, vomiting, or diarrhea. 10/31/2018 on evaluation today patient actually appears to be doing excellent in regard to her lower extremity ulcers. She has been tolerating the dressing changes without complication. Fortunately there is no signs of active infection at this time. She has tolerated 3 layer compression wrap without complication. 11/07/2018 upon evaluation today patient actually appears to be doing very well with regard to her left lower extremity ulcers. She has been tolerating the dressing changes without complication. Fortunately there is no signs of active infection. No fevers, chills, nausea, vomiting, or diarrhea. 11/21/2018 upon evaluation today patient appears to be doing quite well with regard to her lower extremity ulcers. In fact both areas seem to be showing signs of good improvement which is excellent. She is not having as much pain as she has in the past and again has a lot of healing compared to previous visits as well. 12/05/2018 on evaluation today patient presents for follow-up concerning her ongoing issues with her bilateral lower extremity ulcers. The good news is her right lower extremity is showing signs of completely healing at this time which is great news. Fortunately there is no evidence of active infection. On the left she has just a very small area still remaining that is open at this time all in all she is very close to complete closure in my opinion. She does have compression stockings to wear at home. 12/12/2018 on evaluation today patient appears to be doing excellent in regard to her left lateral lower extremity ulcer. She has been tolerating the dressing changes without complication. Fortunately there is no signs of active infection at this time. In fact this appears to be pretty much healed at this point although again she is not 100% today. No fevers, chills, nausea, vomiting, or diarrhea. 12/19/2018 on evaluation today patient actually  appears to be doing excellent with regard to her lower  extremity ulcer in fact this appears to be completely healed today which is all some. She has done extremely well with wound care measures. No fevers, chills, nausea, vomiting, or diarrhea. Electronic Signature(s) Signed: 12/19/2018 2:16:35 PM By: Worthy Keeler PA-C Entered By: Worthy Keeler on 12/19/2018 14:16:34 [name], [name] ([id number]) -------------------------------------------------------------------------------- Physical Exam Details Patient Name: [name] Date of Service: 12/19/2018 1:00 PM Medical Record Number: [id number] Patient Account Number: [id number] Date of Birth/Sex: May 08, 1965 (53 y.o. F) Treating RN: Harold Barban Primary Care Provider: Alean Rinne Other Clinician: Referring Provider: Alean Rinne Treating Provider/Extender: STONE III, HOYT Weeks in Treatment: 8 Constitutional Well-nourished and well-hydrated in no acute distress. Respiratory normal breathing without difficulty. Psychiatric this patient is able to make decisions and demonstrates good insight into disease process. Alert and Oriented x 3. pleasant and cooperative. Notes Since wound bed again showed signs of complete epithelization and has done excellent up to this point. Overall I am extremely pleased with how things appear today. Her edema is also under good control she does have compression stockings that she can transition into at this time. Electronic Signature(s) Signed: 12/19/2018 2:17:42 PM By: Worthy Keeler PA-C Entered By: Worthy Keeler on 12/19/2018 14:17:41 [name], [name] ([id number]) -------------------------------------------------------------------------------- Physician Orders Details Patient Name: [name] Date of Service: 12/19/2018 1:00 PM Medical Record Number: [id number] Patient Account Number: [id number] Date of Birth/Sex: [birth year] (52 y.o. F) Treating RN: Harold Barban Primary Care Provider:  Alean Rinne Other Clinician: Referring Provider: Alean Rinne Treating Provider/Extender: Melburn Hake, HOYT Weeks in Treatment: 8 Verbal / Phone Orders: No Diagnosis Coding ICD-10 Coding Code Description I89.0 Lymphedema, not elsewhere classified I87.2 Venous insufficiency (chronic) (peripheral) L97.822 Non-pressure chronic ulcer of other part of left lower leg with fat layer exposed E11.622 Type 2 diabetes mellitus with other skin ulcer I10 Essential (primary) hypertension N18.3 Chronic kidney disease, stage 3 (moderate) Discharge From [facility] o Discharge from [place] your compression hose daily. Call with any questions, Thank you. Electronic Signature(s) Signed: 12/19/2018 5:27:38 PM By: Harold Barban Signed: 12/19/2018 6:24:05 PM By: Worthy Keeler PA-C Entered By: Harold Barban on 12/19/2018 [place], [place] ([id number]) -------------------------------------------------------------------------------- Problem List Details Patient Name: [name] Date of Service: 12/19/2018 1:00 PM Medical Record Number: [id number] Patient Account Number: [id number] Date of Birth/Sex: Jan 30, 1966 (53 y.o. F) Treating RN: Harold Barban Primary Care Provider: Alean Rinne Other Clinician: Referring Provider: Alean Rinne Treating Provider/Extender: Melburn Hake, HOYT Weeks in Treatment: 8 Active Problems ICD-10 Evaluated Encounter Code Description Active Date Today Diagnosis I89.0 Lymphedema, not elsewhere classified 10/24/2018 No Yes I87.2 Venous insufficiency (chronic) (peripheral) 10/24/2018 No Yes L97.822 Non-pressure chronic ulcer of other part of left lower leg with 10/24/2018 No Yes fat layer exposed E11.622 Type 2 diabetes mellitus with other skin ulcer 10/24/2018 No Yes I10 Essential (primary) hypertension 10/24/2018 No Yes N18.3 Chronic kidney disease, stage 3 (moderate) 10/24/2018 No Yes Inactive Problems Resolved Problems Electronic Signature(s) Signed:  12/19/2018 1:24:40 PM By: Worthy Keeler PA-C Entered By: Worthy Keeler on 12/19/2018 13:24:40 [name], [name] ([id number]) -------------------------------------------------------------------------------- Progress Note Details Patient Name: [name] Date of Service: 12/19/2018 1:00 PM Medical Record Number: [id number] Patient Account Number: [id number] Date of Birth/Sex: 1965/08/29 (53 y.o. F) Treating RN: Harold Barban Primary Care Provider: Alean Rinne Other Clinician: Referring Provider: Alean Rinne Treating Provider/Extender: Melburn Hake, HOYT Weeks in Treatment: 8 Subjective Chief Complaint Information obtained from Patient Left LE Ulcers History of Present Illness (HPI) 10/24/2018 on evaluation today patient presents  for initial evaluation or clinic concerning issues that she has been having with lymphedema for quite some time. Unfortunately she has several wound openings at this point that secondary to her lymphedema/venous stasis are giving her trouble and leaking quite severely. She also has diabetes along with hypertension and stage III chronic kidney disease. Fortunately there is no signs of active infection at this time. She is going to likely require debridement of the left leg ulcer upon evaluation today just based on what I am seeing. Fortunately there is no wound opening on the right. Overall the patient seems to be doing quite well and again there is no evidence of systemic infection which is good news. No fevers, chills, nausea, vomiting, or diarrhea. 10/31/2018 on evaluation today patient actually appears to be doing excellent in regard to her lower extremity ulcers. She has been tolerating the dressing changes without complication. Fortunately there is no signs of active infection at this time. She has tolerated 3 layer compression wrap without complication. 11/07/2018 upon evaluation today patient actually appears to be doing very well with regard to her left lower  extremity ulcers. She has been tolerating the dressing changes without complication. Fortunately there is no signs of active infection. No fevers, chills, nausea, vomiting, or diarrhea. 11/21/2018 upon evaluation today patient appears to be doing quite well with regard to her lower extremity ulcers. In fact both areas seem to be showing signs of good improvement which is excellent. She is not having as much pain as she has in the past and again has a lot of healing compared to previous visits as well. 12/05/2018 on evaluation today patient presents for follow-up concerning her ongoing issues with her bilateral lower extremity ulcers. The good news is her right lower extremity is showing signs of completely healing at this time which is great news. Fortunately there is no evidence of active infection. On the left she has just a very small area still remaining that is open at this time all in all she is very close to complete closure in my opinion. She does have compression stockings to wear at home. 12/12/2018 on evaluation today patient appears to be doing excellent in regard to her left lateral lower extremity ulcer. She has been tolerating the dressing changes without complication. Fortunately there is no signs of active infection at this time. In fact this appears to be pretty much healed at this point although again she is not 100% today. No fevers, chills, nausea, vomiting, or diarrhea. 12/19/2018 on evaluation today patient actually appears to be doing excellent with regard to her lower extremity ulcer in fact this appears to be completely healed today which is all some. She has done extremely well with wound care measures. No fevers, chills, nausea, vomiting, or diarrhea. Patient History Information obtained from Patient. Family History Cancer - Father,Siblings, Diabetes - Mother,Siblings, Heart Disease - Mother,Maternal Grandparents, Hypertension - Mother,Maternal Grandparents, [place],  [place] ([id number]) No family history of Hereditary Spherocytosis, Kidney Disease, Lung Disease, Seizures, Stroke, Thyroid Problems, Tuberculosis. Social History Never smoker, Marital Status - Divorced, Alcohol Use - Never, Drug Use - No History, Caffeine Use - Never. 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 Respiratory Denies complaints or symptoms of Chronic or frequent coughs, Shortness of Breath. Cardiovascular Complains or has symptoms of LE edema. Denies complaints or symptoms of Chest pain. Psychiatric Denies complaints or symptoms of Anxiety, Claustrophobia. Objective Constitutional Well-nourished and well-hydrated in no acute distress. Vitals Time Taken: 1:20 PM, Height: 67 in, Weight: 326 lbs, BMI: 51.1, Temperature: 100.2 F, Pulse: 90 bpm, Respiratory Rate: 16 breaths/min, Blood Pressure: 158/49 mmHg. Respiratory normal breathing without difficulty. Psychiatric [name], [name] ([id number]) this patient is able to make decisions and demonstrates good insight into disease process. Alert and Oriented x 3. pleasant and cooperative. General Notes: Since wound bed  again showed signs of complete epithelization and has done excellent up to this point. Overall I am extremely pleased with how things appear today. Her edema is also under good control she does have compression stockings that she can transition into at this time. Integumentary (Hair, Skin) Wound #1 status is Open. Original cause of wound was Gradually Appeared. The wound is located on the Left,Proximal,Lateral Lower Leg. The wound measures 0cm length x 0cm width x 0cm depth; 0cm^2 area and 0cm^3 volume. There is no tunneling or undermining noted. There is a none present amount of drainage noted. The wound margin is flat and intact. There is no granulation within the wound bed. There is no necrotic tissue within the wound bed. Assessment Active Problems ICD-10 Lymphedema, not elsewhere classified Venous insufficiency (chronic) (peripheral) Non-pressure chronic ulcer of other part of left lower leg with fat layer exposed Type 2 diabetes mellitus with other skin ulcer Essential (primary) hypertension Chronic kidney disease, stage 3 (moderate) Plan Discharge From [facility] Services: Discharge from [place] your compression hose daily. Call with any questions, Thank you. 1. I would recommend currently that we go ahead and discontinue wound care services as the patient seems to be doing very well and again I see no evidence of infection or anything open at this point. She is in agreement with transitioning into her compression stockings if she has any concerns she will let me know. Patient will follow-up in the wound care center as needed if anything changes or worsens. Electronic Signature(s) Signed: 12/19/2018 2:18:04 PM By: Worthy Keeler PA-C Entered By: Worthy Keeler on 12/19/2018 14:18:04 [name], [name] ([id number]) -------------------------------------------------------------------------------- ROS/PFSH Details Patient Name: [name] Date of Service: 12/19/2018 1:00  PM Medical Record Number: [id number] Patient Account Number: [id number] Date of Birth/Sex: 1965/12/08 (53 y.o. F) Treating RN: Harold Barban Primary Care Provider: Alean Rinne Other Clinician: Referring Provider: Alean Rinne Treating Provider/Extender: Melburn Hake, HOYT Weeks in Treatment: 8 Information Obtained From Patient Constitutional Symptoms (General Health) Complaints and Symptoms: Negative for: Fatigue; Fever; Chills; Marked Weight Change Respiratory Complaints and Symptoms: Negative for: Chronic or frequent coughs; Shortness of Breath Cardiovascular Complaints and Symptoms: Positive for: LE edema Negative for: Chest pain Medical History: Positive for: Congestive Heart Failure; Coronary Artery Disease - CABG 2010; Hypertension; Peripheral Venous Disease Negative for: Angina; Arrhythmia; Deep Vein Thrombosis; Hypotension; Myocardial Infarction; Peripheral Arterial Disease; Phlebitis; Vasculitis Past Medical History Notes: HLD Psychiatric Complaints and Symptoms: Negative for: Anxiety; Claustrophobia Endocrine Medical History: Positive for: Type II Diabetes Negative for: Type I Diabetes  Time with diabetes: 1997 Treated with: Insulin Genitourinary Medical History: Positive for: 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 Integumentary (Skin) Medical History: Negative for: History of Burn; History of pressure wounds [name], [name] ([id number]) Neurologic Medical History: Positive for: Neuropathy Negative for: Dementia; Quadriplegia; Paraplegia; Seizure Disorder Immunizations Pneumococcal Vaccine: Received Pneumococcal Vaccination: No Implantable Devices None Family and Social History Cancer: Yes - Father,Siblings; Diabetes: Yes - Mother,Siblings; Heart Disease: Yes - Mother,Maternal Grandparents; Hereditary Spherocytosis: No; Hypertension: Yes - Mother,Maternal Grandparents; Kidney Disease: No; Lung Disease: No; Seizures: No; Stroke: No; Thyroid Problems: No;  Tuberculosis: No; Never smoker; Marital Status - Divorced; Alcohol Use: Never; Drug Use: No History; Caffeine Use: Never; Financial Concerns: No; Food, Clothing or Shelter Needs: No; Support System Lacking: No; Transportation Concerns: No Physician Affirmation I have reviewed and agree with the above information. Electronic Signature(s) Signed: 12/19/2018 5:27:38 PM By: Harold Barban Signed: 12/19/2018 6:24:05 PM By: Worthy Keeler PA-C Entered By: Worthy Keeler on 12/19/2018 14:16:47 [name], [name] ([id number]) -------------------------------------------------------------------------------- SuperBill Details Patient Name: [name] Date of Service: 12/19/2018 Medical Record Number: [id number] Patient Account Number: [id number] Date of Birth/Sex: 14-Jun-1965 (53 y.o. F) Treating RN: Harold Barban Primary Care Provider: Alean Rinne Other Clinician: Referring Provider: Alean Rinne Treating Provider/Extender: Melburn Hake, HOYT Weeks in Treatment: 8 Diagnosis Coding ICD-10 Codes Code Description I89.0 Lymphedema, not elsewhere classified I87.2 Venous insufficiency (chronic) (peripheral) L97.822 Non-pressure chronic ulcer of other part of left lower leg with fat layer exposed E11.622 Type 2 diabetes mellitus with other skin ulcer I10 Essential (primary) hypertension N18.3 Chronic kidney disease, stage 3 (moderate) Facility Procedures CPT4 Code: [id number] Description: 70177 - WOUND CARE VISIT-LEV 2 EST PT Modifier: Quantity: 1 Physician Procedures CPT4 Code Description: [id number] 92330 - WC PHYS LEVEL 3 - EST PT ICD-10 Diagnosis Description I89.0 Lymphedema, not elsewhere classified I87.2 Venous insufficiency (chronic) (peripheral) L97.822 Non-pressure chronic ulcer of other part of left lower leg  wit E11.622 Type 2 diabetes mellitus with other skin ulcer Modifier: h fat layer expos Quantity: 1 ed Electronic Signature(s) Signed: 12/19/2018 2:18:18 PM By: Worthy Keeler  PA-C Entered By: Worthy Keeler on 12/19/2018 14:18:18

## 2018-12-19 NOTE — Progress Notes (Signed)
AMBERLEY, HAMLER ([id number]) Visit Report for 12/19/2018 Arrival Information Details Patient Name: [name], [name] Date of Service: 12/19/2018 1:00 PM Medical Record Number: [id number] Patient Account Number: [id number] Date of Birth/Sex: September 24, 1965 (53 y.o. F) Treating RN: Montey Hora Primary Care Elleah Hemsley: Alean Rinne Other Clinician: Referring Alfard Cochrane: Alean Rinne Treating Charlene Cowdrey/Extender: Melburn Hake, HOYT Weeks in Treatment: 8 Visit Information History Since Last Visit Added or deleted any medications: No Patient Arrived: Ambulatory Any new allergies or adverse reactions: No Arrival Time: 13:19 Had a fall or experienced change in No Accompanied By: self activities of daily living that may affect Transfer Assistance: None risk of falls: Patient Identification Verified: Yes Signs or symptoms of abuse/neglect since last visito No Secondary Verification Process Completed: Yes Hospitalized since last visit: No Implantable device outside of the clinic excluding No cellular tissue based products placed in the center since last visit: Has Dressing in Place as Prescribed: Yes Has Compression in Place as Prescribed: Yes Pain Present Now: No Electronic Signature(s) Signed: 12/19/2018 4:53:44 PM By: Montey Hora Entered By: Montey Hora on 12/19/2018 13:20:10 Desanctis, [name] ([id number]) -------------------------------------------------------------------------------- Clinic Level of Care Assessment Details Patient Name: [name] Date of Service: 12/19/2018 1:00 PM Medical Record Number: [id number] Patient Account Number: [id number] Date of Birth/Sex: 09-19-1965 (53 y.o. F) Treating RN: Harold Barban Primary Care Taneah Masri: Alean Rinne Other Clinician: Referring Jamieka Royle: Alean Rinne Treating Halston Kintz/Extender: Melburn Hake, HOYT Weeks in Treatment: 8 Clinic Level of Care Assessment Items TOOL 4 Quantity Score []  - Use when only an EandM is performed on FOLLOW-UP visit  0 ASSESSMENTS - Nursing Assessment / Reassessment X - Reassessment of Co-morbidities (includes updates in patient status) 1 10 X- 1 5 Reassessment of Adherence to Treatment Plan ASSESSMENTS - Wound and Skin Assessment / Reassessment X - Simple Wound Assessment / Reassessment - one wound 1 5 []  - 0 Complex Wound Assessment / Reassessment - multiple wounds []  - 0 Dermatologic / Skin Assessment (not related to wound area) ASSESSMENTS - Focused Assessment []  - Circumferential Edema Measurements - multi extremities 0 []  - 0 Nutritional Assessment / Counseling / Intervention []  - 0 Lower Extremity Assessment (monofilament, tuning fork, pulses) []  - 0 Peripheral Arterial Disease Assessment (using hand held doppler) ASSESSMENTS - Ostomy and/or Continence Assessment and Care []  - Incontinence Assessment and Management 0 []  - 0 Ostomy Care Assessment and Management (repouching, etc.) PROCESS - Coordination of Care X - Simple Patient / Family Education for ongoing care 1 15 []  - 0 Complex (extensive) Patient / Family Education for ongoing care []  - 0 Staff obtains Programmer, systems, Records, Test Results / Process Orders []  - 0 Staff telephones HHA, Nursing Homes / Clarify orders / etc []  - 0 Routine Transfer to another Facility (non-emergent condition) []  - 0 Routine Hospital Admission (non-emergent condition) []  - 0 New Admissions / Biomedical engineer / Ordering NPWT, Apligraf, etc. []  - 0 Emergency Hospital Admission (emergent condition) X- 1 10 Simple Discharge Coordination Suppes, [place]acia ([id number]) []  - 0 Complex (extensive) Discharge Coordination PROCESS - Special Needs []  - Pediatric / Minor Patient Management 0 []  - 0 Isolation Patient Management []  - 0 Hearing / Language / Visual special needs []  - 0 Assessment of Community assistance (transportation, D/C planning, etc.) []  - 0 Additional assistance / Altered mentation []  - 0 Support Surface(s) Assessment (bed,  cushion, seat, etc.) INTERVENTIONS - Wound Cleansing / Measurement X - Simple Wound Cleansing - one wound 1 5 []  - 0 Complex Wound Cleansing - multiple wounds X- 1 5  Wound Imaging (photographs - any number of wounds) []  - 0 Wound Tracing (instead of photographs) X- 1 5 Simple Wound Measurement - one wound []  - 0 Complex Wound Measurement - multiple wounds INTERVENTIONS - Wound Dressings X - Small Wound Dressing one or multiple wounds 1 10 []  - 0 Medium Wound Dressing one or multiple wounds []  - 0 Large Wound Dressing one or multiple wounds []  - 0 Application of Medications - topical []  - 0 Application of Medications - injection INTERVENTIONS - Miscellaneous []  - External ear exam 0 []  - 0 Specimen Collection (cultures, biopsies, blood, body fluids, etc.) []  - 0 Specimen(s) / Culture(s) sent or taken to Lab for analysis []  - 0 Patient Transfer (multiple staff / Civil Service fast streamer / Similar devices) []  - 0 Simple Staple / Suture removal (25 or less) []  - 0 Complex Staple / Suture removal (26 or more) []  - 0 Hypo / Hyperglycemic Management (close monitor of Blood Glucose) []  - 0 Ankle / Brachial Index (ABI) - do not check if billed separately X- 1 5 Vital Signs Coppens, Myalynn ([id number]) Has the patient been seen at the hospital within the last three years: Yes Total Score: 75 Level Of Care: New/Established - Level 2 Electronic Signature(s) Signed: 12/19/2018 5:27:38 PM By: Harold Barban Entered By: Harold Barban on 12/19/2018 13:39:37 Turkington, [name] ([id number]) -------------------------------------------------------------------------------- Encounter Discharge Information Details Patient Name: [name] Date of Service: 12/19/2018 1:00 PM Medical Record Number: [id number] Patient Account Number: [id number] Date of Birth/Sex: [birth year] (53 y.o. F) Treating RN: Harold Barban Primary Care Jacquelene Kopecky: Alean Rinne Other Clinician: Referring Keiyon Plack: Alean Rinne Treating Jaymarion Trombly/Extender: Melburn Hake, HOYT Weeks in Treatment: 8 Encounter Discharge Information Items Discharge Condition: Stable Ambulatory Status: Ambulatory Discharge Destination: Home Transportation: Private Auto Accompanied By: self Schedule Follow-up Appointment: Yes Clinical Summary of Care: Electronic Signature(s) Signed: 12/19/2018 5:27:38 PM By: Harold Barban Entered By: Harold Barban on 12/19/2018 13:43:40 Lariviere, [name] ([id number]) -------------------------------------------------------------------------------- Lower Extremity Assessment Details Patient Name: [name] Date of Service: 12/19/2018 1:00 PM Medical Record Number: [id number] Patient Account Number: [id number] Date of Birth/Sex: 04-05-1965 (53 y.o. F) Treating RN: Montey Hora Primary Care Shivonne Schwartzman: Alean Rinne Other Clinician: Referring Rayvn Rickerson: Alean Rinne Treating Cierra Rothgeb/Extender: STONE III, HOYT Weeks in Treatment: 8 Vascular Assessment Pulses: Dorsalis Pedis Palpable: [Left:Yes] Posterior Tibial Palpable: [Left:Yes] Electronic Signature(s) Signed: 12/19/2018 4:53:44 PM By: Montey Hora Entered By: Montey Hora on 12/19/2018 13:27:42 Iwanicki, Elanie ([id number]) -------------------------------------------------------------------------------- Multi Wound Chart Details Patient Name: [name] Date of Service: 12/19/2018 1:00 PM Medical Record Number: [id number] Patient Account Number: [id number] Date of Birth/Sex: 1965/06/22 (53 y.o. F) Treating RN: Harold Barban Primary Care Eliyahu Bille: Alean Rinne Other Clinician: Referring Nikiah Goin: Alean Rinne Treating Jonah Gingras/Extender: STONE III, HOYT Weeks in Treatment: 8 Vital Signs Height(in): 67 Pulse(bpm): 90 Weight(lbs): 326 Blood Pressure(mmHg): 158/49 Body Mass Index(BMI): 51 Temperature(F): 100.2 Respiratory Rate 16 (breaths/min): Photos: [N/A:N/A] Wound Location: Left Lower Leg - Lateral, N/A  N/A Proximal Wounding Event: Gradually Appeared N/A N/A Primary Etiology: Venous Leg Ulcer N/A N/A Comorbid History: Congestive Heart Failure, N/A N/A Coronary Artery Disease, Hypertension, Peripheral Venous Disease, Type II Diabetes, End Stage Renal Disease, Neuropathy Date Acquired: 10/03/2018 N/A N/A Weeks of Treatment: 8 N/A N/A Wound Status: Open N/A N/A Measurements L x W x D 0x0x0 N/A N/A (cm) Area (cm) : 0 N/A N/A Volume (cm) : 0 N/A N/A % Reduction in Area: 100.00% N/A N/A % Reduction in Volume: 100.00% N/A N/A Classification: Full Thickness Without N/A N/A Exposed Support Structures  Exudate Amount: None Present N/A N/A Wound Margin: Flat and Intact N/A N/A Granulation Amount: None Present (0%) N/A N/A Necrotic Amount: None Present (0%) N/A N/A Exposed Structures: Fascia: No N/A N/A Fat Layer (Subcutaneous Tissue) Exposed: No Tendon: No Derego, Jonai ([id number]) Muscle: No Joint: No Bone: No Epithelialization: Large (67-100%) N/A N/A Treatment Notes Electronic Signature(s) Signed: 12/19/2018 5:27:38 PM By: Harold Barban Entered By: Harold Barban on 12/19/2018 13:38:33 Paulus, [name] ([id number]) -------------------------------------------------------------------------------- Multi-Disciplinary Care Plan Details Patient Name: [name] Date of Service: 12/19/2018 1:00 PM Medical Record Number: [id number] Patient Account Number: [id number] Date of Birth/Sex: Feb 16, 1966 (53 y.o. F) Treating RN: Harold Barban Primary Care Joriel Streety: Alean Rinne Other Clinician: Referring Rodrickus Min: Alean Rinne Treating Margurete Guaman/Extender: Melburn Hake, HOYT Weeks in Treatment: 8 Active Inactive Electronic Signature(s) Signed: 12/19/2018 1:47:46 PM By: Harold Barban Entered By: Harold Barban on 12/19/2018 13:47:45 Cunliffe, Tassie ([id number]) -------------------------------------------------------------------------------- Pain Assessment Details Patient Name:  [name] Date of Service: 12/19/2018 1:00 PM Medical Record Number: [id number] Patient Account Number: [id number] Date of Birth/Sex: 09/03/1965 (53 y.o. F) Treating RN: Montey Hora Primary Care Zamoria Boss: Alean Rinne Other Clinician: Referring Roderic Lammert: Alean Rinne Treating Iseah Plouff/Extender: Melburn Hake, HOYT Weeks in Treatment: 8 Active Problems Location of Pain Severity and Description of Pain Patient Has Paino Yes Site Locations Pain Location: Pain in Ulcers With Dressing Change: Yes Duration of the Pain. Constant / Intermittento Intermittent Pain Management and Medication Current Pain Management: Electronic Signature(s) Signed: 12/19/2018 4:53:44 PM By: Montey Hora Entered By: Montey Hora on 12/19/2018 13:20:34 Derosia, [name] ([id number]) -------------------------------------------------------------------------------- Patient/Caregiver Education Details Patient Name: [name] Date of Service: 12/19/2018 1:00 PM Medical Record Number: [id number] Patient Account Number: [id number] Date of Birth/Gender: 27-Mar-1965 (53 y.o. F) Treating RN: Harold Barban Primary Care Physician: Alean Rinne Other Clinician: Referring Physician: Alean Rinne Treating Physician/Extender: Sharalyn Ink in Treatment: 8 Education Assessment Education Provided To: Patient Education Topics Provided Wound/Skin Impairment: Handouts: Caring for Your Ulcer Methods: Demonstration, Explain/Verbal Responses: State content correctly Electronic Signature(s) Signed: 12/19/2018 5:27:38 PM By: Harold Barban Entered By: Harold Barban on 12/19/2018 13:38:51 Boese, Daneli ([id number]) -------------------------------------------------------------------------------- Wound Assessment Details Patient Name: [name] Date of Service: 12/19/2018 1:00 PM Medical Record Number: [id number] Patient Account Number: [id number] Date of Birth/Sex: 05/18/1965 (53 y.o. F) Treating RN:  Montey Hora Primary Care Rolene Andrades: Alean Rinne Other Clinician: Referring Zayed Griffie: Alean Rinne Treating Chablis Losh/Extender: Melburn Hake, HOYT Weeks in Treatment: 8 Wound Status Wound Number: 1 Primary Venous Leg Ulcer Etiology: Wound Location: Left Lower Leg - Lateral, Proximal Wound Open Wounding Event: Gradually Appeared Status: Date Acquired: 10/03/2018 Comorbid Congestive Heart Failure, Coronary Artery Weeks Of Treatment: 8 History: Disease, Hypertension, Peripheral Venous Clustered Wound: No Disease, Type II Diabetes, End Stage Renal Disease, Neuropathy Photos Wound Measurements Length: (cm) Width: (cm) Depth: (cm) Area: (cm) Volume: (cm) 0 % Reduction in Area: 100% 0 % Reduction in Volume: 100% 0 Epithelialization: Large (67-100%) 0 Tunneling: No 0 Undermining: No Wound Description Full Thickness Without Exposed Support Classification: Structures Wound Margin: Flat and Intact Exudate None Present Amount: Foul Odor After Cleansing: No Slough/Fibrino No Wound Bed Granulation Amount: None Present (0%) Exposed Structure Necrotic Amount: None Present (0%) Fascia Exposed: No Fat Layer (Subcutaneous Tissue) Exposed: No Tendon Exposed: No Muscle Exposed: No Joint Exposed: No Bone Exposed: No Moisan, Neira ([id number]) Electronic Signature(s) Signed: 12/19/2018 4:53:44 PM By: Montey Hora Entered By: Montey Hora on 12/19/2018 13:26:40 Verville, [name] ([id number]) -------------------------------------------------------------------------------- Vitals Details Patient Name: [name] Date of Service: 12/19/2018 1:00 PM Medical Record  Number: [id number] Patient Account Number: [id number] Date of Birth/Sex: 1965-04-01 (53 y.o. F) Treating RN: Montey Hora Primary Care Perpetua Elling: Alean Rinne Other Clinician: Referring Breeona Waid: Alean Rinne Treating Jandiel Magallanes/Extender: Melburn Hake, HOYT Weeks in Treatment: 8 Vital Signs Time Taken: 13:20 Temperature  (F): 100.2 Height (in): 67 Pulse (bpm): 90 Weight (lbs): 326 Respiratory Rate (breaths/min): 16 Body Mass Index (BMI): 51.1 Blood Pressure (mmHg): 158/49 Reference Range: 80 - 120 mg / dl Electronic Signature(s) Signed: 12/19/2018 4:53:44 PM By: Montey Hora Entered By: Montey Hora on 12/19/2018 13:27:20

## 2018-12-20 ENCOUNTER — Ambulatory Visit: Payer: Self-pay | Admitting: Podiatry

## 2018-12-26 ENCOUNTER — Ambulatory Visit: Payer: Medicare Other | Admitting: Physician Assistant

## 2019-01-03 ENCOUNTER — Ambulatory Visit (INDEPENDENT_AMBULATORY_CARE_PROVIDER_SITE_OTHER): Payer: Medicare Other | Admitting: Podiatry

## 2019-01-03 ENCOUNTER — Encounter: Payer: Self-pay | Admitting: Podiatry

## 2019-01-03 ENCOUNTER — Other Ambulatory Visit: Payer: Self-pay

## 2019-01-03 DIAGNOSIS — B351 Tinea unguium: Secondary | ICD-10-CM

## 2019-01-03 DIAGNOSIS — E0843 Diabetes mellitus due to underlying condition with diabetic autonomic (poly)neuropathy: Secondary | ICD-10-CM

## 2019-01-03 DIAGNOSIS — M79676 Pain in unspecified toe(s): Secondary | ICD-10-CM

## 2019-01-03 DIAGNOSIS — L989 Disorder of the skin and subcutaneous tissue, unspecified: Secondary | ICD-10-CM | POA: Diagnosis not present
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
## 2019-01-11 ENCOUNTER — Other Ambulatory Visit (HOSPITAL_COMMUNITY): Payer: Self-pay | Admitting: *Deleted

## 2019-01-11 NOTE — Progress Notes (Signed)
Pt called requesting cardiac rehab referral. Per Dr.McLean ok to order cardiac rehab. Order placed.

## 2019-01-18 ENCOUNTER — Other Ambulatory Visit (HOSPITAL_COMMUNITY): Payer: Self-pay | Admitting: *Deleted

## 2019-01-18 ENCOUNTER — Telehealth (HOSPITAL_COMMUNITY): Payer: Self-pay | Admitting: *Deleted

## 2019-01-18 ENCOUNTER — Encounter (HOSPITAL_COMMUNITY): Payer: Self-pay | Admitting: *Deleted

## 2019-01-18 NOTE — Progress Notes (Signed)
Received referral from Dr. Aundra Dubin for this pt to participate in Cardiac rehab with the diagnosis Diastolic Heart Failure. Confirmed with Heart failure clinic that pt would like to participate in [facility] verses [place].  Note Pt was referred to [facility] for outpatient cardiac rehab by inpatient cardiac rehab phase I staff. Left message for pt requested call back. Pt EF is too high for Medicare reimbursement.  Pt with recent NSTEMI and DES in July.  Will ask Heart Failure clinic place another referral with an applicable diagnosis - NSTEMI and DES. Once new referral is placed, will have support staff verify insurance./benefits contact pt for scheduling. Cherre Huger, BSN Cardiac and Training and development officer

## 2019-01-18 NOTE — Telephone Encounter (Signed)
Called and left message for pt in regards to Cardiac rehab referral.  Requested call back. Cherre Huger, BSN Cardiac and Training and development officer

## 2019-01-23 ENCOUNTER — Telehealth (HOSPITAL_COMMUNITY): Payer: Self-pay

## 2019-01-23 ENCOUNTER — Encounter (HOSPITAL_COMMUNITY): Payer: Self-pay

## 2019-01-23 NOTE — Telephone Encounter (Signed)
Attempted to call patient in regards to Cardiac Rehab - LM on VM Mailed letter 

## 2019-01-23 NOTE — Telephone Encounter (Signed)
Pt insurance is active and benefits verified through [facility]. Co-pay $20.00, DED $0.00/$0.00 met, out of pocket $3,600.00/$2,867.05 met, co-insurance 0%. No pre-authorization required. Passport, 01/23/2019 @ 316PM, AUQ#[id number]

## 2019-02-14 ENCOUNTER — Telehealth (HOSPITAL_COMMUNITY): Payer: Self-pay | Admitting: *Deleted

## 2019-02-14 NOTE — Telephone Encounter (Signed)
Left VM requesting pt call back about 12/30 office visit due to covid19 precautions would like to change pt to telehealth visit if she is doing well.

## 2019-02-20 ENCOUNTER — Encounter (HOSPITAL_COMMUNITY): Payer: Medicare Other | Admitting: Cardiology

## 2019-04-03 ENCOUNTER — Ambulatory Visit: Payer: Medicare Other | Admitting: Podiatry

## 2019-04-04 ENCOUNTER — Other Ambulatory Visit: Payer: Self-pay

## 2019-04-04 ENCOUNTER — Ambulatory Visit (HOSPITAL_COMMUNITY): Admission: RE | Admit: 2019-04-04 | Payer: Medicare Other | Source: Ambulatory Visit | Admitting: Cardiology

## 2019-04-06 ENCOUNTER — Ambulatory Visit: Payer: Medicare Other | Admitting: Podiatry

## 2019-04-10 ENCOUNTER — Other Ambulatory Visit (HOSPITAL_COMMUNITY): Payer: Self-pay | Admitting: Adult Health

## 2019-04-11 ENCOUNTER — Other Ambulatory Visit: Payer: Self-pay

## 2019-04-11 ENCOUNTER — Encounter (HOSPITAL_COMMUNITY): Payer: Self-pay | Admitting: Cardiology

## 2019-04-11 ENCOUNTER — Ambulatory Visit (HOSPITAL_COMMUNITY)
Admission: RE | Admit: 2019-04-11 | Discharge: 2019-04-11 | Disposition: A | Discharge status: Home / Self Care | Payer: Medicare Other | Source: Ambulatory Visit | Attending: Cardiology | Admitting: Cardiology

## 2019-04-11 VITALS — BP 151/60 | HR 84 | Wt 318.4 lb

## 2019-04-11 DIAGNOSIS — I208 Other forms of angina pectoris: Secondary | ICD-10-CM | POA: Diagnosis not present

## 2019-04-11 DIAGNOSIS — G4733 Obstructive sleep apnea (adult) (pediatric): Secondary | ICD-10-CM | POA: Insufficient documentation

## 2019-04-11 DIAGNOSIS — E785 Hyperlipidemia, unspecified: Secondary | ICD-10-CM | POA: Insufficient documentation

## 2019-04-11 DIAGNOSIS — I130 Hypertensive heart and chronic kidney disease with heart failure and stage 1 through stage 4 chronic kidney disease, or unspecified chronic kidney disease: Secondary | ICD-10-CM | POA: Insufficient documentation

## 2019-04-11 DIAGNOSIS — I5032 Chronic diastolic (congestive) heart failure: Secondary | ICD-10-CM | POA: Diagnosis not present

## 2019-04-11 DIAGNOSIS — I5022 Chronic systolic (congestive) heart failure: Secondary | ICD-10-CM | POA: Diagnosis not present

## 2019-04-11 DIAGNOSIS — E11621 Type 2 diabetes mellitus with foot ulcer: Secondary | ICD-10-CM | POA: Diagnosis not present

## 2019-04-11 DIAGNOSIS — Z881 Allergy status to other antibiotic agents status: Secondary | ICD-10-CM | POA: Diagnosis not present

## 2019-04-11 DIAGNOSIS — Z9851 Tubal ligation status: Secondary | ICD-10-CM | POA: Insufficient documentation

## 2019-04-11 DIAGNOSIS — Z951 Presence of aortocoronary bypass graft: Secondary | ICD-10-CM | POA: Diagnosis not present

## 2019-04-11 DIAGNOSIS — E1122 Type 2 diabetes mellitus with diabetic chronic kidney disease: Secondary | ICD-10-CM | POA: Insufficient documentation

## 2019-04-11 DIAGNOSIS — I25810 Atherosclerosis of coronary artery bypass graft(s) without angina pectoris: Secondary | ICD-10-CM | POA: Insufficient documentation

## 2019-04-11 DIAGNOSIS — Z888 Allergy status to other drugs, medicaments and biological substances status: Secondary | ICD-10-CM | POA: Diagnosis not present

## 2019-04-11 DIAGNOSIS — M109 Gout, unspecified: Secondary | ICD-10-CM | POA: Insufficient documentation

## 2019-04-11 DIAGNOSIS — I252 Old myocardial infarction: Secondary | ICD-10-CM | POA: Diagnosis not present

## 2019-04-11 DIAGNOSIS — Z7902 Long term (current) use of antithrombotics/antiplatelets: Secondary | ICD-10-CM | POA: Insufficient documentation

## 2019-04-11 DIAGNOSIS — Z79899 Other long term (current) drug therapy: Secondary | ICD-10-CM | POA: Diagnosis not present

## 2019-04-11 DIAGNOSIS — Z6841 Body Mass Index (BMI) 40.0 and over, adult: Secondary | ICD-10-CM | POA: Diagnosis not present

## 2019-04-11 DIAGNOSIS — Z886 Allergy status to analgesic agent status: Secondary | ICD-10-CM | POA: Insufficient documentation

## 2019-04-11 DIAGNOSIS — Z7982 Long term (current) use of aspirin: Secondary | ICD-10-CM | POA: Diagnosis not present

## 2019-04-11 DIAGNOSIS — I872 Venous insufficiency (chronic) (peripheral): Secondary | ICD-10-CM | POA: Diagnosis not present

## 2019-04-11 DIAGNOSIS — I2089 Other forms of angina pectoris: Secondary | ICD-10-CM

## 2019-04-11 DIAGNOSIS — F329 Major depressive disorder, single episode, unspecified: Secondary | ICD-10-CM | POA: Diagnosis not present

## 2019-04-11 DIAGNOSIS — Z794 Long term (current) use of insulin: Secondary | ICD-10-CM | POA: Insufficient documentation

## 2019-04-11 DIAGNOSIS — Z8249 Family history of ischemic heart disease and other diseases of the circulatory system: Secondary | ICD-10-CM | POA: Diagnosis not present

## 2019-04-11 DIAGNOSIS — I25119 Atherosclerotic heart disease of native coronary artery with unspecified angina pectoris: Secondary | ICD-10-CM | POA: Diagnosis present

## 2019-04-11 DIAGNOSIS — Z91040 Latex allergy status: Secondary | ICD-10-CM | POA: Insufficient documentation

## 2019-04-11 DIAGNOSIS — N183 Chronic kidney disease, stage 3 unspecified: Secondary | ICD-10-CM | POA: Diagnosis not present

## 2019-04-11 LAB — BASIC METABOLIC PANEL
Anion gap: 10 (ref 5–15)
BUN: 41 mg/dL — ABNORMAL HIGH (ref 6–20)
CO2: 27 mmol/L (ref 22–32)
Calcium: 9.3 mg/dL (ref 8.9–10.3)
Chloride: 102 mmol/L (ref 98–111)
Creatinine, Ser: 2.15 mg/dL — ABNORMAL HIGH (ref 0.44–1.00)
GFR calc Af Amer: 29 mL/min — ABNORMAL LOW (ref >60)
GFR calc non Af Amer: 25 mL/min — ABNORMAL LOW (ref >60)
Glucose, Bld: 180 mg/dL — ABNORMAL HIGH (ref 70–99)
Potassium: 3.7 mmol/L (ref 3.5–5.1)
Sodium: 139 mmol/L (ref 135–145)

## 2019-04-11 MED ORDER — EFFIENT 10 MG PO TABS: 10.0000 mg | ORAL_TABLET | Freq: Every day | ORAL | 5 refills | Status: DC

## 2019-04-11 MED ORDER — TORSEMIDE 20 MG PO TABS: ORAL_TABLET | ORAL | 5 refills | Status: DC
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
## 2019-04-11 NOTE — Patient Instructions (Addendum)
Labs today and repeat in 10 days in [place].  You were provided a prescription to do so.  We will only contact you if something comes back abnormal or we need to make some changes. Otherwise no news is good news!   INCREASE Torsemide to 60mg  (3 tabs) twice a day for 3 days, THEN START 60mg  (3 tabs) in the morning and 40mg  (2 tabs) in the evening there after    A refill for Effient has been sent to your pharmacy.    You were given a prescription for compression stockings, please take that to your pharmacy supply store.   Your physician recommends that you schedule a follow-up appointment in: 1 month with Dr Aundra Dubin     Please call office at [phone] option 2 if you have any questions or concerns.    At the [facility], you and your health needs are our priority. As part of our continuing mission to provide you with exceptional heart care, we have created designated Provider Care Teams. These Care Teams include your primary Cardiologist (physician) and Advanced Practice Providers (APPs- Physician Assistants and Nurse Practitioners) who all work together to provide you with the care you need, when you need it.   You may see any of the following providers on your designated Care Team at your next follow up: Marland Kitchen Dr Glori Bickers . Dr Loralie Champagne . Darrick Grinder, NP . Lyda Jester, PA . Audry Riles, PharmD   Please be sure to bring in all your medications bottles to every appointment.

## 2019-04-13 ENCOUNTER — Ambulatory Visit: Payer: Medicare Other | Admitting: Podiatry

## 2019-04-21 ENCOUNTER — Other Ambulatory Visit (HOSPITAL_COMMUNITY): Payer: Self-pay | Admitting: Adult Health

## 2019-05-01 ENCOUNTER — Other Ambulatory Visit (HOSPITAL_COMMUNITY): Payer: Self-pay

## 2019-05-01 MED ORDER — EFFIENT 10 MG PO TABS: 10.0000 mg | ORAL_TABLET | Freq: Every day | ORAL | 5 refills | Status: DC

## 2019-05-03 ENCOUNTER — Other Ambulatory Visit (HOSPITAL_COMMUNITY): Payer: Self-pay

## 2019-05-03 MED ORDER — EFFIENT 10 MG PO TABS: 10.0000 mg | ORAL_TABLET | Freq: Every day | ORAL | 3 refills | Status: DC

## 2019-05-03 NOTE — Telephone Encounter (Signed)
Refill for effient recently filled as 30 day supply to mail order. 90 supply sent.

## 2019-05-09 ENCOUNTER — Other Ambulatory Visit (HOSPITAL_COMMUNITY): Payer: Self-pay | Admitting: Adult Health

## 2019-05-12 ENCOUNTER — Encounter (HOSPITAL_COMMUNITY): Payer: Medicare Other | Admitting: Cardiology

## 2019-06-13 ENCOUNTER — Other Ambulatory Visit (HOSPITAL_COMMUNITY): Payer: Self-pay | Admitting: Adult Health

## 2019-06-15 ENCOUNTER — Ambulatory Visit (HOSPITAL_COMMUNITY)
Admission: RE | Admit: 2019-06-15 | Discharge: 2019-06-15 | Disposition: A | Discharge status: Home / Self Care | Payer: Medicare Other | Source: Ambulatory Visit | Attending: Cardiology | Admitting: Cardiology

## 2019-06-15 ENCOUNTER — Encounter (HOSPITAL_COMMUNITY): Payer: Self-pay | Admitting: Cardiology

## 2019-06-15 ENCOUNTER — Other Ambulatory Visit: Payer: Self-pay

## 2019-06-15 VITALS — BP 134/76 | HR 86 | Wt 319.0 lb

## 2019-06-15 DIAGNOSIS — I878 Other specified disorders of veins: Secondary | ICD-10-CM | POA: Insufficient documentation

## 2019-06-15 DIAGNOSIS — I208 Other forms of angina pectoris: Secondary | ICD-10-CM

## 2019-06-15 DIAGNOSIS — I872 Venous insufficiency (chronic) (peripheral): Secondary | ICD-10-CM | POA: Diagnosis not present

## 2019-06-15 DIAGNOSIS — Z881 Allergy status to other antibiotic agents status: Secondary | ICD-10-CM | POA: Insufficient documentation

## 2019-06-15 DIAGNOSIS — Z8249 Family history of ischemic heart disease and other diseases of the circulatory system: Secondary | ICD-10-CM | POA: Insufficient documentation

## 2019-06-15 DIAGNOSIS — N183 Chronic kidney disease, stage 3 unspecified: Secondary | ICD-10-CM | POA: Diagnosis not present

## 2019-06-15 DIAGNOSIS — Z888 Allergy status to other drugs, medicaments and biological substances status: Secondary | ICD-10-CM | POA: Insufficient documentation

## 2019-06-15 DIAGNOSIS — E785 Hyperlipidemia, unspecified: Secondary | ICD-10-CM | POA: Diagnosis not present

## 2019-06-15 DIAGNOSIS — Z79899 Other long term (current) drug therapy: Secondary | ICD-10-CM | POA: Insufficient documentation

## 2019-06-15 DIAGNOSIS — Z7982 Long term (current) use of aspirin: Secondary | ICD-10-CM | POA: Diagnosis not present

## 2019-06-15 DIAGNOSIS — Z91040 Latex allergy status: Secondary | ICD-10-CM | POA: Insufficient documentation

## 2019-06-15 DIAGNOSIS — I5032 Chronic diastolic (congestive) heart failure: Secondary | ICD-10-CM | POA: Diagnosis not present

## 2019-06-15 DIAGNOSIS — Z955 Presence of coronary angioplasty implant and graft: Secondary | ICD-10-CM | POA: Insufficient documentation

## 2019-06-15 DIAGNOSIS — Z794 Long term (current) use of insulin: Secondary | ICD-10-CM | POA: Diagnosis not present

## 2019-06-15 DIAGNOSIS — I252 Old myocardial infarction: Secondary | ICD-10-CM | POA: Insufficient documentation

## 2019-06-15 DIAGNOSIS — I130 Hypertensive heart and chronic kidney disease with heart failure and stage 1 through stage 4 chronic kidney disease, or unspecified chronic kidney disease: Secondary | ICD-10-CM | POA: Insufficient documentation

## 2019-06-15 DIAGNOSIS — I25118 Atherosclerotic heart disease of native coronary artery with other forms of angina pectoris: Secondary | ICD-10-CM | POA: Insufficient documentation

## 2019-06-15 DIAGNOSIS — Z6841 Body Mass Index (BMI) 40.0 and over, adult: Secondary | ICD-10-CM | POA: Diagnosis not present

## 2019-06-15 DIAGNOSIS — E1122 Type 2 diabetes mellitus with diabetic chronic kidney disease: Secondary | ICD-10-CM | POA: Diagnosis not present

## 2019-06-15 LAB — BASIC METABOLIC PANEL
Anion gap: 10 (ref 5–15)
BUN: 32 mg/dL — ABNORMAL HIGH (ref 6–20)
CO2: 30 mmol/L (ref 22–32)
Calcium: 9.4 mg/dL (ref 8.9–10.3)
Chloride: 104 mmol/L (ref 98–111)
Creatinine, Ser: 1.95 mg/dL — ABNORMAL HIGH (ref 0.44–1.00)
GFR calc Af Amer: 33 mL/min — ABNORMAL LOW (ref >60)
GFR calc non Af Amer: 28 mL/min — ABNORMAL LOW (ref >60)
Glucose, Bld: 145 mg/dL — ABNORMAL HIGH (ref 70–99)
Potassium: 3.5 mmol/L (ref 3.5–5.1)
Sodium: 144 mmol/L (ref 135–145)

## 2019-06-15 NOTE — Patient Instructions (Addendum)
Labs today We will only contact you if something comes back abnormal or we need to make some changes. Otherwise no news is good news!  Your physician recommends that you schedule a follow-up appointment in: 4 months with Dr Aundra Dubin. You will get a call to schedule this appointment.   Please call office at [phone] option 2 if you have any questions or concerns.   At the [facility], you and your health needs are our priority. As part of our continuing mission to provide you with exceptional heart care, we have created designated Provider Care Teams. These Care Teams include your primary Cardiologist (physician) and Advanced Practice Providers (APPs- Physician Assistants and Nurse Practitioners) who all work together to provide you with the care you need, when you need it.   You may see any of the following providers on your designated Care Team at your next follow up: Marland Kitchen Dr Glori Bickers . Dr Loralie Champagne . Darrick Grinder, NP . Lyda Jester, PA . Audry Riles, PharmD   Please be sure to bring in all your medications bottles to every appointment.
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
## 2019-08-28 ENCOUNTER — Telehealth (HOSPITAL_COMMUNITY): Payer: Self-pay | Admitting: *Deleted

## 2019-08-28 DIAGNOSIS — I5022 Chronic systolic (congestive) heart failure: Secondary | ICD-10-CM

## 2019-08-28 DIAGNOSIS — R600 Localized edema: Secondary | ICD-10-CM

## 2019-08-28 NOTE — Telephone Encounter (Signed)
Pt left VM stating her compression hose are not wide enough now she has two spots on the back of her legs that are painful and oozing slightly. Pt said she needs to know what to to keep her swelling down in her legs but the compression hose are doing more bad than good.  Routed to [facility]

## 2019-08-29 NOTE — Telephone Encounter (Signed)
Can we get Kerlex wraps for her legs?

## 2019-08-29 NOTE — Telephone Encounter (Signed)
Called and spoke with pt she does not want to have leg wraps. She wants an order for wider tops of compression hose. Pt said she was told by medical supply she would need a special order. Pt send picture of band on compression hose. I told pt I would contact a few medical supply stores and see how to get them ordered for her.

## 2019-09-04 NOTE — Telephone Encounter (Signed)
Patient called and left a vm on the triage line wanting to know if she could have her legs wrapped and if she could start receiving home health. Tried calling patient,no answer,left message to return call

## 2019-09-04 NOTE — Addendum Note (Signed)
Addended by: Kerry Dory on: 09/04/2019 02:21 PM   Modules accepted: Orders

## 2019-09-04 NOTE — Telephone Encounter (Signed)
Patient returned call to triage to report a "spot" on her leg has opened up with continued BLE edema Reports she is having a hard time finding exact brand for compression stocking with extended sizes, this was a special order possibly written by the podiatrist over a year ago. Order was then filled by the medical supply company in [place], again patient not 184% certain of location.   Advised that I would place order for home health wound care/leg wraps to further treat spot on her legs as previously ordered by Dr Aundra Dubin. Compression wraps could very well be apart of this therapy to assist with BLE. Advised patient in the meantime to contact previous provider/medical supply company for the original  Order, and we would do our best to duplicate  for extended size compression stockings. However she may want to contact PCP as they maybe more familiar with this order.

## 2019-09-05 NOTE — Telephone Encounter (Signed)
[facility] wound care order faxed to [facility] @ [phone]

## 2019-09-29 ENCOUNTER — Other Ambulatory Visit (HOSPITAL_COMMUNITY): Payer: Self-pay | Admitting: Cardiology

## 2019-09-29 DIAGNOSIS — I5022 Chronic systolic (congestive) heart failure: Secondary | ICD-10-CM

## 2019-10-16 ENCOUNTER — Ambulatory Visit: Payer: Medicare Other | Admitting: Physician Assistant

## 2019-10-30 ENCOUNTER — Ambulatory Visit: Payer: Medicare Other | Admitting: Physician Assistant

## 2019-10-30 ENCOUNTER — Other Ambulatory Visit (HOSPITAL_COMMUNITY): Payer: Self-pay | Admitting: Adult Health

## 2019-10-30 ENCOUNTER — Other Ambulatory Visit (HOSPITAL_COMMUNITY): Payer: Self-pay | Admitting: Cardiology

## 2019-11-01 ENCOUNTER — Encounter: Payer: Medicare Other | Attending: Internal Medicine | Admitting: Internal Medicine

## 2019-11-01 ENCOUNTER — Other Ambulatory Visit: Payer: Self-pay

## 2019-11-01 DIAGNOSIS — I872 Venous insufficiency (chronic) (peripheral): Secondary | ICD-10-CM | POA: Insufficient documentation

## 2019-11-01 DIAGNOSIS — I2510 Atherosclerotic heart disease of native coronary artery without angina pectoris: Secondary | ICD-10-CM | POA: Insufficient documentation

## 2019-11-01 DIAGNOSIS — Z794 Long term (current) use of insulin: Secondary | ICD-10-CM | POA: Insufficient documentation

## 2019-11-01 DIAGNOSIS — Z951 Presence of aortocoronary bypass graft: Secondary | ICD-10-CM | POA: Insufficient documentation

## 2019-11-01 DIAGNOSIS — N183 Chronic kidney disease, stage 3 unspecified: Secondary | ICD-10-CM | POA: Insufficient documentation

## 2019-11-01 DIAGNOSIS — I129 Hypertensive chronic kidney disease with stage 1 through stage 4 chronic kidney disease, or unspecified chronic kidney disease: Secondary | ICD-10-CM | POA: Diagnosis not present

## 2019-11-01 DIAGNOSIS — L97221 Non-pressure chronic ulcer of left calf limited to breakdown of skin: Secondary | ICD-10-CM | POA: Insufficient documentation

## 2019-11-01 NOTE — Progress Notes (Signed)
[name], [name] ([id number]) Visit Report for 11/01/2019 Abuse/Suicide Risk Screen Details Patient Name: [name] [name] Date of Service: 11/01/2019 9:15 AM Medical Record Number: [id number] Patient Account Number: [id number] Date of Birth/Sex: [birth year] (54 y.o. F) Treating RN: Grover Canavan Primary Care Toan Mort: Alean Rinne Other Clinician: Referring Mirielle Byrum: Referral, Self Treating Monnica Saltsman/Extender: Beverly Gust in Treatment: 0 Abuse/Suicide Risk Screen Items Answer ABUSE RISK SCREEN: Has anyone close to you tried to hurt or harm you recentlyo No Do you feel uncomfortable with anyone in your familyo No Has anyone forced you do things that you didnot want to doo No Electronic Signature(s) Signed: 11/01/2019 4:18:47 PM By: Grover Canavan Entered By: Grover Canavan on 11/01/2019 09:56:59 [name] [name] ([id number]) -------------------------------------------------------------------------------- Activities of Daily Living Details Patient Name: [name], [name] Date of Service: 11/01/2019 9:15 AM Medical Record Number: [id number] Patient Account Number: [id number] Date of Birth/Sex: 09-Sep-1965 (54 y.o. F) Treating RN: Grover Canavan Primary Care Sapna Padron: Alean Rinne Other Clinician: Referring Bentzion Dauria: Referral, Self Treating Jujuan Dugo/Extender: Beverly Gust in Treatment: 0 Activities of Daily Living Items Answer Activities of Daily Living (Please select one for each item) Drive Automobile Completely Able Take Medications Completely Able Use Telephone Completely Able Care for Appearance Completely Able Use Toilet Completely Able Bath / Shower Completely Able Dress Self Completely Able Feed Self Completely Able Walk Completely Able Get In / Out Bed Completely Able Housework Completely Able Prepare Meals Completely [place] for Self Completely Able Electronic Signature(s) Signed: 11/01/2019 4:18:47 PM By: Grover Canavan Entered By: Grover Canavan on 11/01/2019 09:57:29 [name] [name] [name] ([id number]) -------------------------------------------------------------------------------- Education Screening Details Patient Name: [name] [name] Date of Service: 11/01/2019 9:15 AM Medical Record Number: [id number] Patient Account Number: [id number] Date of Birth/Sex: 12/10/1965 (54 y.o. F) Treating RN: Grover Canavan Primary Care Servando Kyllonen: Alean Rinne Other Clinician: Referring Lamees Gable: Referral, Self Treating Gayl Ivanoff/Extender: Beverly Gust in Treatment: 0 Primary Learner Assessed: Patient Learning Preferences/Education Level/Primary Language Learning Preference: Explanation, Demonstration, Printed Material Highest Education Level: High School Preferred Language: English Cognitive Barrier Language Barrier: No Translator Needed: No Memory Deficit: No Emotional Barrier: No Cultural/Religious Beliefs Affecting Medical Care: No Physical Barrier Impaired Vision: No Impaired Hearing: No Decreased Hand dexterity: No Knowledge/Comprehension Knowledge Level: High Comprehension Level: High Ability to understand written instructions: High Ability to understand verbal instructions: High Motivation Anxiety Level: Calm Cooperation: Cooperative Education Importance: Acknowledges Need Interest in Health Problems: Asks Questions Perception: Coherent Willingness to Engage in Self-Management High Activities: Readiness to Engage in Self-Management High Activities: Electronic Signature(s) Signed: 11/01/2019 4:18:47 PM By: Grover Canavan Entered By: Grover Canavan on 11/01/2019 09:58:03 [name] [name] ([id number]) -------------------------------------------------------------------------------- Fall Risk Assessment Details Patient Name: [name] [name] Date of Service: 11/01/2019 9:15 AM Medical Record Number: [id number] Patient Account Number: [id number] Date of Birth/Sex: 12/05/1965 (54 y.o.  F) Treating RN: Grover Canavan Primary Care Gwynn Crossley: Alean Rinne Other Clinician: Referring Khaleed Holan: Referral, Self Treating Sarahlynn Cisnero/Extender: Beverly Gust in Treatment: 0 Fall Risk Assessment Items Have you had 2 or more falls in the last 12 monthso 0 Yes Have you had any fall that resulted in injury in the last 12 monthso 0 No FALLS RISK SCREEN History of falling - immediate or within 3 months 25 Yes Secondary diagnosis (Do you have 2 or more medical diagnoseso) 0 No Ambulatory aid None/bed rest/wheelchair/nurse 0 No Crutches/cane/walker 0 No Furniture 0 No Intravenous therapy Access/Saline/Heparin Lock 0 No Gait/Transferring Normal/ bed rest/ wheelchair 0 Yes Weak (short steps with or without shuffle, stooped but able to lift head  while walking, may 0 No seek support from furniture) Impaired (short steps with shuffle, may have difficulty arising from chair, head down, impaired 0 No balance) Mental Status Oriented to own ability 0 No Electronic Signature(s) Signed: 11/01/2019 4:18:47 PM By: Grover Canavan Entered By: Grover Canavan on 11/01/2019 09:58:29 [name] [name] ([id number]) -------------------------------------------------------------------------------- Foot Assessment Details Patient Name: [name] [name] Date of Service: 11/01/2019 9:15 AM Medical Record Number: [id number] Patient Account Number: [id number] Date of Birth/Sex: October 21, 1965 (54 y.o. F) Treating RN: Grover Canavan Primary Care Anett Ranker: Alean Rinne Other Clinician: Referring Kenecia Barren: Referral, Self Treating Henning Ehle/Extender: Beverly Gust in Treatment: 0 Foot Assessment Items Site Locations + = Sensation present, - = Sensation absent, C = Callus, U = Ulcer R = Redness, W = Warmth, M = Maceration, PU = Pre-ulcerative lesion F = Fissure, S = Swelling, D = Dryness Assessment Right: Left: Other Deformity: No No Prior Foot Ulcer: No No Prior Amputation: No No Charcot  Joint: No No Ambulatory Status: Ambulatory Without Help Gait: Steady Electronic Signature(s) Signed: 11/01/2019 4:18:47 PM By: Grover Canavan Entered By: Grover Canavan on 11/01/2019 10:01:55 [name] [name] ([id number]) -------------------------------------------------------------------------------- Nutrition Risk Screening Details Patient Name: [name], [name] Date of Service: 11/01/2019 9:15 AM Medical Record Number: [id number] Patient Account Number: [id number] Date of Birth/Sex: 07-Dec-1965 (54 y.o. F) Treating RN: Grover Canavan Primary Care Cristina Ceniceros: Alean Rinne Other Clinician: Referring Carsyn Taubman: Referral, Self Treating Caniyah Murley/Extender: Beverly Gust in Treatment: 0 Height (in): 67 Weight (lbs): 317 Body Mass Index (BMI): 49.6 Nutrition Risk Screening Items Score Screening NUTRITION RISK SCREEN: I have an illness or condition that made me change the kind and/or amount of food I eat 0 No I eat fewer than two meals per day 0 No I eat few fruits and vegetables, or milk products 0 No I have three or more drinks of beer, liquor or wine almost every day 0 No I have tooth or mouth problems that make it hard for me to eat 0 No I don't always have enough money to buy the food I need 0 No I eat alone most of the time 0 No I take three or more different prescribed or over-the-counter drugs a day 0 No Without wanting to, I have lost or gained 10 pounds in the last six months 0 No I am not always physically able to shop, cook and/or feed myself 0 No Nutrition Protocols Good Risk Protocol 0 No interventions needed Moderate Risk Protocol High Risk Proctocol Risk Level: Good Risk Score: 0 Electronic Signature(s) Signed: 11/01/2019 4:18:47 PM By: Grover Canavan Entered By: Grover Canavan on 11/01/2019 09:58:39

## 2019-11-01 NOTE — Progress Notes (Signed)
[name], [name] ([id number]) Visit Report for 11/01/2019 HPI Details Patient Name: [name], [name] Date of Service: 11/01/2019 9:15 AM Medical Record Number: [id number] Patient Account Number: [id number] Date of Birth/Sex: 15-Dec-1965 (54 y.o. F) Treating RN: Cornell Barman Primary Care Provider: Alean Rinne Other Clinician: Referring Provider: Referral, Self Treating Provider/Extender: Beverly Gust in Treatment: 0 History of Present Illness HPI Description: 10/24/2018 on evaluation today patient presents for initial evaluation or clinic concerning issues that she has been having with lymphedema for quite some time. Unfortunately she has several wound openings at this point that secondary to her lymphedema/venous stasis are giving her trouble and leaking quite severely. She also has diabetes along with hypertension and stage III chronic kidney disease. Fortunately there is no signs of active infection at this time. She is going to likely require debridement of the left leg ulcer upon evaluation today just based on what I am seeing. Fortunately there is no wound opening on the right. Overall the patient seems to be doing quite well and again there is no evidence of systemic infection which is good news. No fevers, chills, nausea, vomiting, or diarrhea. 10/31/2018 on evaluation today patient actually appears to be doing excellent in regard to her lower extremity ulcers. She has been tolerating the dressing changes without complication. Fortunately there is no signs of active infection at this time. She has tolerated 3 layer compression wrap without complication. 11/07/2018 upon evaluation today patient actually appears to be doing very well with regard to her left lower extremity ulcers. She has been tolerating the dressing changes without complication. Fortunately there is no signs of active infection. No fevers, chills, nausea, vomiting, or diarrhea. 11/21/2018 upon evaluation today patient  appears to be doing quite well with regard to her lower extremity ulcers. In fact both areas seem to be showing signs of good improvement which is excellent. She is not having as much pain as she has in the past and again has a lot of healing compared to previous visits as well. 12/05/2018 on evaluation today patient presents for follow-up concerning her ongoing issues with her bilateral lower extremity ulcers. The good news is her right lower extremity is showing signs of completely healing at this time which is great news. Fortunately there is no evidence of active infection. On the left she has just a very small area still remaining that is open at this time all in all she is very close to complete closure in my opinion. She does have compression stockings to wear at home. 12/12/2018 on evaluation today patient appears to be doing excellent in regard to her left lateral lower extremity ulcer. She has been tolerating the dressing changes without complication. Fortunately there is no signs of active infection at this time. In fact this appears to be pretty much healed at this point although again she is not 100% today. No fevers, chills, nausea, vomiting, or diarrhea. 12/19/2018 on evaluation today patient actually appears to be doing excellent with regard to her lower extremity ulcer in fact this appears to be completely healed today which is all some. She has done extremely well with wound care measures. No fevers, chills, nausea, vomiting, or diarrhea. ------------------------------------ 11/01/19-Readmission to the clinic Patient presents with left leg pain, wounds posterior calf, onset about 4 weeks, denies any fevers chills or shakes, has not been using anything to these wounds, was given compression stockings at last discharge from the clinic but has not been able to use them as they rolled down and cause  creases Patient's history significant for type 2 diabetes insulin requiring, A1c of 6.9  lately, hypertension, chronic pain Electronic Signature(s) Signed: 11/01/2019 10:51:28 AM By: Tobi Bastos MD, MBA Entered By: Tobi Bastos on 11/01/2019 10:51:28 Petrelli, [name] ([id number]) -------------------------------------------------------------------------------- Physical Exam Details Patient Name: [name] Date of Service: 11/01/2019 9:15 AM Medical Record Number: [id number] Patient Account Number: [id number] Date of Birth/Sex: 20-Aug-1965 (54 y.o. F) Treating RN: Cornell Barman Primary Care Provider: Alean Rinne Other Clinician: Referring Provider: Referral, Self Treating Provider/Extender: Beverly Gust in Treatment: 0 Constitutional alert and oriented x 3. sitting or standing blood pressure is within target range for patient.. supine blood pressure is within target range for patient.. pulse regular and within target range for patient.Marland Kitchen respirations regular, non-labored and within target range for patient.Marland Kitchen temperature within target range for patient.. . . Well-nourished and well-hydrated in no acute distress. Eyes conjunctiva clear no eyelid edema noted. pupils equal round and reactive to light and accommodation. Ears, Nose, Mouth, and Throat no gross abnormality of ear auricles or external auditory canals. normal hearing noted during conversation. mucus membranes moist. Neck supple with no LAD noted in anterior or posterior cervical chain. not enlarged. Respiratory normal breathing without difficulty. clear to auscultation bilaterally. Cardiovascular regular rate and rhythm with normal S1, S2. no bruits with no significant JVD. 2+ femoral pulses. 2+ dorsalis pedis/posterior tibialis pulses. no clubbing, cyanosis, significant edema, <3 sec cap refill. Gastrointestinal (GI) soft, non-tender, non-distended, +BS. no hepatosplenomegaly. no ventral hernia noted. Lymphatic no cervical lymphadenopathy. no axillary lymphadenopathy. no inguinal  lymphadenopathy. Musculoskeletal normal gait and posture. no significant deformity or arthritic changes, no loss or range of motion, no clubbing. full range of motion without deformity. full range of motion without deformity. full range of motion with greater than 10 degrees of flexion of the ankle. full range of motion with greater than 10 degrees of flexion of the ankle. Integumentary (Hair, Skin) normal hair distribution and pattern. skin pink, warm, dry. Neurological cranial nerves 2-12 intact. Patient has normal sensation in the feet bilaterally to light touch. Psychiatric this patient is able to make decisions and demonstrates good insight into disease process. Alert and Oriented x 3. pleasant and cooperative. Notes Left lower extremity stasis changes, with lymphedema, woody consistency, to posterior calf wound small in size, no slough noted, very tender and patient does not allow me to even touch these wounds with gauze Right leg with edema less so than the left. Electronic Signature(s) Signed: 11/01/2019 10:52:36 AM By: Tobi Bastos MD, MBA Entered By: Tobi Bastos on 11/01/2019 10:52:35 Easterwood, [name] ([id number]) -------------------------------------------------------------------------------- Physician Orders Details Patient Name: [name] Date of Service: 11/01/2019 9:15 AM Medical Record Number: [id number] Patient Account Number: [id number] Date of Birth/Sex: 11-24-1965 (54 y.o. F) Treating RN: Grover Canavan Primary Care Provider: Alean Rinne Other Clinician: Referring Provider: Referral, Self Treating Provider/Extender: Beverly Gust in Treatment: 0 Verbal / Phone Orders: No Diagnosis Coding Wound Cleansing o May shower with protection. Skin Barriers/Peri-Wound Care Wound #4 Left,Lateral Lower Leg o Moisturizing lotion Wound #5 Left,Posterior Lower Leg o Moisturizing lotion Primary Wound Dressing Wound #4 Left,Lateral Lower Leg o  Silver Alginate Wound #5 Left,Posterior Lower Leg o Silver Alginate Secondary Dressing o ABD pad Dressing Change Frequency Wound #4 Left,Lateral Lower Leg o Change dressing every week Wound #5 Left,Posterior Lower Leg o Change dressing every week Follow-up Appointments Wound #4 Left,Lateral Lower Leg o Return Appointment in 1 week. Wound #5 Left,Posterior Lower Leg o Return Appointment in 1 week. Edema Control   o 3 Layer Compression System - Left Lower Extremity o Elevate legs to the level of the heart and pump ankles as often as possible Services and Therapies o Venous Studies -Bilateral - vascular studiies Electronic Signature(s) Signed: 11/01/2019 4:03:38 PM By: Tobi Bastos MD, MBA Signed: 11/01/2019 4:18:47 PM By: Grover Canavan Entered By: Grover Canavan on 11/01/2019 10:54:53 Colter, [name] ([id number]) -------------------------------------------------------------------------------- Problem List Details Patient Name: [name] Date of Service: 11/01/2019 9:15 AM Medical Record Number: [id number] Patient Account Number: [id number] Date of Birth/Sex: 30-Sep-1965 (54 y.o. F) Treating RN: Cornell Barman Primary Care Provider: Alean Rinne Other Clinician: Referring Provider: Referral, Self Treating Provider/Extender: Beverly Gust in Treatment: 0 Active Problems ICD-10 Encounter Code Description Active Date MDM Diagnosis L97.221 Non-pressure chronic ulcer of left calf limited to breakdown of skin 11/01/2019 No Yes I87.2 Venous insufficiency (chronic) (peripheral) 11/01/2019 No Yes Inactive Problems Resolved Problems Electronic Signature(s) Signed: 11/01/2019 4:03:38 PM By: Tobi Bastos MD, MBA Signed: 11/01/2019 4:18:47 PM By: Grover Canavan Previous Signature: 11/01/2019 10:54:34 AM Version By: Tobi Bastos MD, MBA Entered By: Grover Canavan on 11/01/2019 10:57:01 Cirillo, [name]  ([id number]) -------------------------------------------------------------------------------- Progress Note Details Patient Name: [name] Date of Service: 11/01/2019 9:15 AM Medical Record Number: [id number] Patient Account Number: [id number] Date of Birth/Sex: 30-Mar-1965 (54 y.o. F) Treating RN: Cornell Barman Primary Care Provider: Alean Rinne Other Clinician: Referring Provider: Referral, Self Treating Provider/Extender: Beverly Gust in Treatment: 0 Subjective History of Present Illness (HPI) 10/24/2018 on evaluation today patient presents for initial evaluation or clinic concerning issues that she has been having with lymphedema for quite some time. Unfortunately she has several wound openings at this point that secondary to her lymphedema/venous stasis are giving her trouble and leaking quite severely. She also has diabetes along with hypertension and stage III chronic kidney disease. Fortunately there is no signs of active infection at this time. She is going to likely require debridement of the left leg ulcer upon evaluation today just based on what I am seeing. Fortunately there is no wound opening on the right. Overall the patient seems to be doing quite well and again there is no evidence of systemic infection which is good news. No fevers, chills, nausea, vomiting, or diarrhea. 10/31/2018 on evaluation today patient actually appears to be doing excellent in regard to her lower extremity ulcers. She has been tolerating the dressing changes without complication. Fortunately there is no signs of active infection at this time. She has tolerated 3 layer compression wrap without complication. 11/07/2018 upon evaluation today patient actually appears to be doing very well with regard to her left lower extremity ulcers. She has been tolerating the dressing changes without complication. Fortunately there is no signs of active infection. No fevers, chills, nausea, vomiting,  or diarrhea. 11/21/2018 upon evaluation today patient appears to be doing quite well with regard to her lower extremity ulcers. In fact both areas seem to be showing signs of good improvement which is excellent. She is not having as much pain as she has in the past and again has a lot of healing compared to previous visits as well. 12/05/2018 on evaluation today patient presents for follow-up concerning her ongoing issues with her bilateral lower extremity ulcers. The good news is her right lower extremity is showing signs of completely healing at this time which is great news. Fortunately there is no evidence of active infection. On the left she has just a very small area still remaining that is open at this time all in all  she is very close to complete closure in my opinion. She does have compression stockings to wear at home. 12/12/2018 on evaluation today patient appears to be doing excellent in regard to her left lateral lower extremity ulcer. She has been tolerating the dressing changes without complication. Fortunately there is no signs of active infection at this time. In fact this appears to be pretty much healed at this point although again she is not 100% today. No fevers, chills, nausea, vomiting, or diarrhea. 12/19/2018 on evaluation today patient actually appears to be doing excellent with regard to her lower extremity ulcer in fact this appears to be completely healed today which is all some. She has done extremely well with wound care measures. No fevers, chills, nausea, vomiting, or diarrhea. ------------------------------------ 11/01/19-Readmission to the clinic Patient presents with left leg pain, wounds posterior calf, onset about 4 weeks, denies any fevers chills or shakes, has not been using anything to these wounds, was given compression stockings at last discharge from the clinic but has not been able to use them as they rolled down and cause creases Patient's history  significant for type 2 diabetes insulin requiring, A1c of 6.9 lately, hypertension, chronic pain Patient History Information obtained from Patient. Allergies Tylenol PM (Reaction: hives), diazepam, latex (Reaction: itching), simvastatin (Reaction: joint pain), ciprofloxacin (Reaction: anxiety), levofloxacin (Reaction: anxiety), Omnicef (Reaction: anxiety) Family History Cancer - Father,Siblings, Diabetes - Mother,Siblings, Heart Disease - Mother,Maternal Grandparents, Hypertension - Mother,Maternal Grandparents, No family history of Hereditary Spherocytosis, Kidney Disease, Lung Disease, Seizures, Stroke, Thyroid Problems, Tuberculosis. Social History Never smoker, Marital Status - Divorced, Alcohol Use - Never, Drug Use - No History, Caffeine Use - Never. Medical History Eyes Denies history of Cataracts, Glaucoma, Optic Neuritis Ear/Nose/Mouth/Throat Denies history of Chronic sinus problems/congestion, Middle ear problems Hematologic/Lymphatic Denies history of Anemia, Hemophilia, Human Immunodeficiency Virus, Lymphedema, Sickle Cell Disease Respiratory Denies history of Aspiration, Asthma, Chronic Obstructive Pulmonary Disease (COPD), Pneumothorax, Sleep Apnea, Tuberculosis Cardiovascular Mckinstry, Farah ([id number]) Patient has history of Congestive Heart Failure, Coronary Artery Disease - CABG 2010, Hypertension, Peripheral Venous Disease Denies history of Angina, Arrhythmia, Deep Vein Thrombosis, Hypotension, Myocardial Infarction, Peripheral Arterial Disease, Phlebitis, Vasculitis Gastrointestinal Denies history of Cirrhosis , Colitis, Crohn s, Hepatitis A, Hepatitis B, Hepatitis C Endocrine Patient has history of Type II Diabetes Denies history of Type I Diabetes Genitourinary Patient has history of 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 Immunological Denies history of Lupus Erythematosus, Raynaud s, Scleroderma Integumentary (Skin) Denies history of History of Burn, History  of pressure wounds Musculoskeletal Denies history of Gout, Rheumatoid Arthritis, Osteoarthritis, Osteomyelitis Neurologic Patient has history of Neuropathy Denies history of Dementia, Quadriplegia, Paraplegia, Seizure Disorder Oncologic Denies history of Received Chemotherapy, Received Radiation Psychiatric Denies history of Anorexia/bulimia, Confinement Anxiety Patient is treated with Insulin. Blood sugar is tested. Hospitalization/Surgery History - c section. - hernia. - cabg. Medical And Surgical History Notes Cardiovascular HLD Review of Systems (ROS) Constitutional Symptoms (General Health) Denies complaints or symptoms of Fatigue, Fever, Chills, Marked Weight Change. Eyes Complains or has symptoms of Glasses / Contacts. Ear/Nose/Mouth/Throat Denies complaints or symptoms of Difficult clearing ears, Sinusitis, thick sinus drainage Hematologic/Lymphatic Denies complaints or symptoms of Bleeding / Clotting Disorders, Human Immunodeficiency Virus. Respiratory Denies complaints or symptoms of Chronic or frequent coughs, Shortness of Breath. Cardiovascular Denies complaints or symptoms of Chest pain, LE edema. Gastrointestinal Denies complaints or symptoms of Frequent diarrhea, Nausea, Vomiting. Endocrine Denies complaints or symptoms of Hepatitis, Thyroid disease, Polydypsia (Excessive Thirst). Genitourinary Denies complaints  or symptoms of Kidney failure/ Dialysis, Incontinence/dribbling. Immunological Denies complaints or symptoms of Hives, Itching. Integumentary (Skin) Complains or has symptoms of Wounds - left lower leg. Musculoskeletal Denies complaints or symptoms of Muscle Pain, Muscle Weakness. Neurologic Denies complaints or symptoms of Numbness/parasthesias, Focal/Weakness. Psychiatric Complains or has symptoms of Anxiety. Denies complaints or symptoms of Claustrophobia. Objective Constitutional alert and oriented x 3. sitting or standing blood pressure is  within target range for patient.. supine blood pressure is within target range for patient.. pulse regular and within target range for patient.Marland Kitchen respirations regular, non-labored and within target range for patient.Marland Kitchen temperature within target range for patient.. Well-nourished and well-hydrated in no acute distress. Vitals Time Taken: 9:46 AM, Height: 67 in, Source: Stated, Weight: 317 lbs, Source: Stated, BMI: 49.6, Temperature: 98.7 F, Pulse: 76 bpm, Demps, Ariane ([id number]) Respiratory Rate: 18 breaths/min, Blood Pressure: 130/47 mmHg. Eyes conjunctiva clear no eyelid edema noted. pupils equal round and reactive to light and accommodation. Ears, Nose, Mouth, and Throat no gross abnormality of ear auricles or external auditory canals. normal hearing noted during conversation. mucus membranes moist. Neck supple with no LAD noted in anterior or posterior cervical chain. not enlarged. Respiratory normal breathing without difficulty. clear to auscultation bilaterally. Cardiovascular regular rate and rhythm with normal S1, S2. no bruits with no significant JVD. 2+ femoral pulses. 2+ dorsalis pedis/posterior tibialis pulses. no clubbing, cyanosis, significant edema, Gastrointestinal (GI) soft, non-tender, non-distended, +BS. no hepatosplenomegaly. no ventral hernia noted. Lymphatic no cervical lymphadenopathy. no axillary lymphadenopathy. no inguinal lymphadenopathy. Musculoskeletal normal gait and posture. no significant deformity or arthritic changes, no loss or range of motion, no clubbing. full range of motion without deformity. full range of motion without deformity. full range of motion with greater than 10 degrees of flexion of the ankle. full range of motion with greater than 10 degrees of flexion of the ankle. Neurological cranial nerves 2-12 intact. Patient has normal sensation in the feet bilaterally to light touch. Psychiatric this patient is able to make decisions and  demonstrates good insight into disease process. Alert and Oriented x 3. pleasant and cooperative. General Notes: Left lower extremity stasis changes, with lymphedema, woody consistency, to posterior calf wound small in size, no slough noted, very tender and patient does not allow me to even touch these wounds with gauze Right leg with edema less so than the left. Integumentary (Hair, Skin) normal hair distribution and pattern. skin pink, warm, dry. Wound #4 status is Open. Original cause of wound was Gradually Appeared. The wound is located on the Left,Lateral Lower Leg. The wound measures 1.2cm length x 1cm width x 0.1cm depth; 0.942cm^2 area and 0.094cm^3 volume. There is Fat Layer (Subcutaneous Tissue) Exposed exposed. There is no tunneling or undermining noted. There is a medium amount of serosanguineous drainage noted. The wound margin is flat and intact. There is small (1-33%) red granulation within the wound bed. There is a small (1-33%) amount of necrotic tissue within the wound bed including Adherent Slough. Wound #5 status is Open. Original cause of wound was Gradually Appeared. The wound is located on the Left,Posterior Lower Leg. The wound measures 1.5cm length x 1.2cm width x 0.1cm depth; 1.414cm^2 area and 0.141cm^3 volume. There is Fat Layer (Subcutaneous Tissue) Exposed exposed. There is no tunneling or undermining noted. There is a medium amount of serosanguineous drainage noted. The wound margin is flat and intact. There is small (1-33%) red granulation within the wound bed. There is a medium (34-66%) amount of necrotic tissue within  the wound bed including Adherent Slough. Procedures Wound #4 Pre-procedure diagnosis of Wound #4 is a Venous Leg Ulcer located on the Left,Lateral Lower Leg . There was a Three Layer Compression Therapy Procedure with a pre-treatment ABI of 1 by Grover Canavan, RN. Post procedure Diagnosis Wound #4: Same as Pre-Procedure Plan -Venous leg ulcers  left lower extremity, edema is not significantly worse than baseline it appears, will go with 3 layer compression with silver alginate primary dressing with extra sorb as patient is complaining of drainage -Keep legs elevated as much as possible -Patient to return next week AMANTHA, SKLAR ([id number]) Electronic Signature(s) Signed: 11/01/2019 10:53:31 AM By: Tobi Bastos MD, MBA Entered By: Tobi Bastos on 11/01/2019 10:53:31 [place]burn, [name] ([id number]) -------------------------------------------------------------------------------- ROS/PFSH Details Patient Name: [name] Date of Service: 11/01/2019 9:15 AM Medical Record Number: [id number] Patient Account Number: [id number] Date of Birth/Sex: 1966/01/03 (54 y.o. F) Treating RN: Grover Canavan Primary Care Provider: Alean Rinne Other Clinician: Referring Provider: Referral, Self Treating Provider/Extender: Beverly Gust in Treatment: 0 Information Obtained From Patient Constitutional Symptoms (General Health) Complaints and Symptoms: Negative for: Fatigue; Fever; Chills; Marked Weight Change Eyes Complaints and Symptoms: Positive for: Glasses / Contacts Medical History: Negative for: Cataracts; Glaucoma; Optic Neuritis Ear/Nose/Mouth/Throat Complaints and Symptoms: Negative for: Difficult clearing ears; Sinusitis Review of System Notes: thick sinus drainage Medical History: Negative for: Chronic sinus problems/congestion; Middle ear problems Hematologic/Lymphatic Complaints and Symptoms: Negative for: Bleeding / Clotting Disorders; Human Immunodeficiency Virus Medical History: Negative for: Anemia; Hemophilia; Human Immunodeficiency Virus; Lymphedema; Sickle Cell Disease Respiratory Complaints and Symptoms: Negative for: Chronic or frequent coughs; Shortness of Breath Medical History: Negative for: Aspiration; Asthma; Chronic Obstructive Pulmonary Disease (COPD); Pneumothorax; Sleep Apnea;  Tuberculosis Cardiovascular Complaints and Symptoms: Negative for: Chest pain; LE edema Medical History: Positive for: Congestive Heart Failure; Coronary Artery Disease - CABG 2010; Hypertension; Peripheral Venous Disease Negative for: Angina; Arrhythmia; Deep Vein Thrombosis; Hypotension; Myocardial Infarction; Peripheral Arterial Disease; Phlebitis; Vasculitis Past Medical History Notes: HLD Gastrointestinal Complaints and Symptoms: Negative for: Frequent diarrhea; Nausea; Vomiting Medical History: Negative for: Cirrhosis ; Colitis; Crohnos; Hepatitis A; Hepatitis B; Hepatitis C Meeuwsen, Daysia ([id number]) Endocrine Complaints and Symptoms: Negative for: Hepatitis; Thyroid disease; Polydypsia (Excessive Thirst) Medical History: Positive for: Type II Diabetes Negative for: Type I Diabetes Time with diabetes: 1997 Treated with: Insulin Blood sugar tested every day: Yes Tested : 3 x daily Genitourinary Complaints and Symptoms: Negative for: Kidney failure/ Dialysis; Incontinence/dribbling Medical History: Positive for: 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 Immunological Complaints and Symptoms: Negative for: Hives; Itching Medical History: Negative for: Lupus Erythematosus; Raynaudos; Scleroderma Integumentary (Skin) Complaints and Symptoms: Positive for: Wounds - left lower leg Medical History: Negative for: History of Burn; History of pressure wounds Musculoskeletal Complaints and Symptoms: Negative for: Muscle Pain; Muscle Weakness Medical History: Negative for: Gout; Rheumatoid Arthritis; Osteoarthritis; Osteomyelitis Neurologic Complaints and Symptoms: Negative for: Numbness/parasthesias; Focal/Weakness Medical History: Positive for: Neuropathy Negative for: Dementia; Quadriplegia; Paraplegia; Seizure Disorder Psychiatric Complaints and Symptoms: Positive for: Anxiety Negative for: Claustrophobia Medical History: Negative for: Anorexia/bulimia; Confinement  Anxiety Oncologic Medical History: Negative for: Received Chemotherapy; Received Radiation Immunizations Pneumococcal Vaccine: Received Pneumococcal Vaccination: Yes [place], Anaih ([id number]) Implantable Devices None Hospitalization / Surgery History Type of Hospitalization/Surgery c section hernia cabg Family and Social History Cancer: Yes - Father,Siblings; Diabetes: Yes - Mother,Siblings; Heart Disease: Yes - Mother,Maternal Grandparents; Hereditary Spherocytosis: No; Hypertension: Yes - Mother,Maternal Grandparents; Kidney Disease: No; Lung Disease: No; Seizures: No; Stroke: No; Thyroid Problems: No;  Tuberculosis: No; Never smoker; Marital Status - Divorced; Alcohol Use: Never; Drug Use: No History; Caffeine Use: Never; Financial Concerns: No; Food, Clothing or Shelter Needs: No; Support System Lacking: No; Transportation Concerns: No Electronic Signature(s) Signed: 11/01/2019 4:03:38 PM By: Tobi Bastos MD, MBA Signed: 11/01/2019 4:18:47 PM By: Grover Canavan Entered By: Grover Canavan on 11/01/2019 09:56:45 Prajapati, [name] ([id number]) -------------------------------------------------------------------------------- SuperBill Details Patient Name: [name] Date of Service: 11/01/2019 Medical Record Number: [id number] Patient Account Number: [id number] Date of Birth/Sex: [birth year] (54 y.o. 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 (chronic) (peripheral) Facility Procedures CPT4 Code: [id number] Description: (Facility Use Only) [phone] - [place] LWR LT LEG Modifier: Quantity: 1 Physician Procedures CPT4 Code: 7998721 Description: WC PHYS LEVEL 3 o NEW  PT Modifier: Quantity: 1 CPT4 Code: Description: ICD-10 Diagnosis Description L97.221 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

## 2019-11-01 NOTE — Progress Notes (Signed)
[name], [name] ([id number]) Visit Report for 11/01/2019 Allergy List Details Patient Name: [name] Date of Service: 11/01/2019 9:15 AM Medical Record Number: [id number] Patient Account Number: [id number] Date of Birth/Sex: 11-12-1965 (54 y.o. F) Treating RN: [name] Primary Care Rusell Meneely: [name] Other Clinician: Referring Dewana Ammirati: Referral, Self Treating Charlita [name]/Extender: [name] in Treatment: 0 Allergies Active Allergies Tylenol PM Reaction: hives diazepam latex Reaction: itching simvastatin Reaction: joint pain ciprofloxacin Reaction: anxiety levofloxacin Reaction: anxiety Omnicef Reaction: anxiety Allergy Notes Electronic Signature(s) Signed: 11/01/2019 4:18:47 PM By: [name] Entered By: [name] on 11/01/2019 09:48:48 [name] ([id number]) -------------------------------------------------------------------------------- Arrival Information Details Patient Name: [name] Date of Service: 11/01/2019 9:15 AM Medical Record Number: [id number] Patient Account Number: [id number] Date of Birth/Sex: 05/14/1965 (54 y.o. F) Treating RN: [name] Primary Care Tahjae Clausing: [name] Other Clinician: Referring [name]: Referral, Self Treating [name]/Extender: [name] in Treatment: 0 Visit Information Patient Arrived: Ambulatory Arrival Time: 09:41 Accompanied By: self Transfer Assistance: None Patient Identification Verified: Yes Secondary Verification Process Completed: Yes History Since Last Visit Added or deleted any medications: Yes Had a fall or experienced change in activities of daily living that may affect risk of falls: Yes Hospitalized since last visit: No Electronic Signature(s) Signed: 11/01/2019 4:18:47 PM By: [name] Entered By: [name] on 11/01/2019 09:45:55 [name]  ([id number]) -------------------------------------------------------------------------------- Clinic Level of Care Assessment Details Patient Name: [name] Date of Service: 11/01/2019 9:15 AM Medical Record Number: [id number] Patient Account Number: [id number] Date of Birth/Sex: 11/07/1965 (54 y.o. F) Treating RN: [name] Primary Care Fardowsa Authier: [name] Other Clinician: Referring Jerzey Komperda: Referral, Self Treating Harue Pribble/Extender: [name] in Treatment: 0 Clinic Level of Care Assessment Items TOOL 1 Quantity Score []  - Use when EandM and Procedure is performed on INITIAL visit 0 ASSESSMENTS - Nursing Assessment / Reassessment X - General Physical Exam (combine w/ comprehensive assessment (listed just below) when performed on new 1 20 pt. evals) X- 1 25 Comprehensive Assessment (HX, ROS, Risk Assessments, Wounds Hx, etc.) ASSESSMENTS - Wound and Skin Assessment / Reassessment []  - Dermatologic / Skin Assessment (not related to wound area) 0 ASSESSMENTS - Ostomy and/or Continence Assessment and Care []  - Incontinence Assessment and Management 0 []  - 0 Ostomy Care Assessment and Management (repouching, etc.) PROCESS - Coordination of Care X - Simple Patient / Family Education for ongoing care 1 15 []  - 0 Complex (extensive) Patient / Family Education for ongoing care X- 1 10 Staff obtains Programmer, systems, Records, Test Results / Process Orders []  - 0 Staff telephones HHA, Nursing Homes / Clarify orders / etc []  - 0 Routine Transfer to another Facility (non-emergent condition) []  - 0 Routine Hospital Admission (non-emergent condition) X- 1 15 New Admissions / Biomedical engineer / Ordering NPWT, Apligraf, etc. []  - 0 Emergency Hospital Admission (emergent condition) PROCESS - Special Needs []  - Pediatric / Minor Patient Management 0 []  - 0 Isolation Patient Management []  - 0 Hearing / Language / Visual special needs []  - 0 Assessment of  Community assistance (transportation, D/C planning, etc.) []  - 0 Additional assistance / Altered mentation []  - 0 Support Surface(s) Assessment (bed, cushion, seat, etc.) INTERVENTIONS - Miscellaneous []  - External ear exam 0 []  - 0 Patient Transfer (multiple staff / Civil Service fast streamer / Similar devices) []  - 0 Simple Staple / Suture removal (25 or less) []  - 0 Complex Staple / Suture removal (26 or more) []  - 0 Hypo/Hyperglycemic Management (do not check if billed separately) X- 1  15 Ankle / Brachial Index (ABI) - do not check if billed separately Has the patient been seen at the hospital within the last three years: Yes Total Score: 100 Level Of Care: New/Established - Level 3 [name] ([id number]) Electronic Signature(s) Signed: 11/01/2019 4:18:47 PM By: [name] Entered By: [name] on 11/01/2019 10:56:08 [name] ([id number]) -------------------------------------------------------------------------------- Compression Therapy Details Patient Name: [name] Date of Service: 11/01/2019 9:15 AM Medical Record Number: [id number] Patient Account Number: [id number] Date of Birth/Sex: 1965-07-27 (54 y.o. F) Treating RN: [name] Primary Care Harveer Sadler: [name] Other Clinician: Referring [name]: Referral, Self Treating Chevis Weisensel/Extender: [name] in Treatment: 0 Compression Therapy Performed for Wound Assessment: Wound #4 Left,Lateral Lower Leg Performed By: Clinician [name] Canavan, RN Compression Type: Three Layer Pre Treatment ABI: 1 Post Procedure Diagnosis Same as Pre-procedure Electronic Signature(s) Signed: 11/01/2019 4:18:47 PM By: [name] Entered By: [name] on 11/01/2019 10:48:39 [name] ([id number]) -------------------------------------------------------------------------------- Encounter Discharge Information Details Patient Name: [name] Date of Service: 11/01/2019 9:15  AM Medical Record Number: [id number] Patient Account Number: [id number] Date of Birth/Sex: [birth year] (54 y.o. F) Treating RN: [name] Primary Care Doloris Servantes: [name] Other Clinician: Referring [name]: Referral, Self Treating [name]/Extender: [name] in Treatment: 0 Encounter Discharge Information Items Discharge Condition: Stable Ambulatory Status: Ambulatory Discharge Destination: Home Transportation: Private Auto Accompanied By: self Schedule Follow-up Appointment: Yes Clinical Summary of Care: Electronic Signature(s) Signed: 11/01/2019 4:18:47 PM By: [name] Entered By: [name] on 11/01/2019 10:58:11 Wuellner, Malachy [name] ([id number]) -------------------------------------------------------------------------------- Lower Extremity Assessment Details Patient Name: [name] Date of Service: 11/01/2019 9:15 AM Medical Record Number: [id number] Patient Account Number: [id number] Date of Birth/Sex: [birth year] (54 y.o. F) Treating RN: [name] Primary Care Chayla Shands: [name] Other Clinician: Referring Tymira Horkey: Referral, Self Treating Dennice Tindol/Extender: [name] in Treatment: 0 Edema Assessment Assessed: [Left: Yes] [Right: Yes] [Left: Edema] [Right: :] Calf Left: Right: Point of Measurement: 33 cm From Medial Instep 48.5 cm 45 cm Ankle Left: Right: Point of Measurement: 10 cm From Medial Instep 28 cm 24 cm Vascular Assessment Pulses: Dorsalis Pedis Palpable: [Left:Yes] [Right:Yes] Posterior Tibial Palpable: [Left:Yes] [Right:Yes] Blood Pressure: Brachial: [Left:130] [Right:130] Ankle: [Left:Dorsalis Pedis: 130 1.00] [Right:Dorsalis Pedis: 78 0.60] Electronic Signature(s) Signed: 11/01/2019 4:18:47 PM By: [name] Entered By: [name] on 11/01/2019 10:31:07 Molloy, Konica ([id number]) -------------------------------------------------------------------------------- Multi Wound Chart  Details Patient Name: [name] Date of Service: 11/01/2019 9:15 AM Medical Record Number: [id number] Patient Account Number: [id number] Date of Birth/Sex: 07-17-1965 (54 y.o. 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/Ulcer of the Lower N/A Extremity Extremity Comorbid History: Congestive Heart Failure, Coronary Congestive Heart Failure, Coronary N/A Artery Disease, Hypertension, Artery Disease, Hypertension, Peripheral Venous Disease, Type II Peripheral Venous Disease, Type II Diabetes, End Stage Renal Disease, Diabetes, End Stage Renal Disease, Neuropathy Neuropathy Date Acquired: 09/16/2019 09/16/2019 N/A Weeks of Treatment: 0 0 N/A Wound Status: Open Open N/A Measurements L x W x D (cm) 1.2x1x0.1 1.5x1.2x0.1 N/A Area (cm) : 0.942 1.414 N/A Volume (cm) : 0.094 0.141 N/A Classification: Partial Thickness Partial  Thickness N/A Exudate Amount: Medium Medium N/A Exudate Type: Serosanguineous Serosanguineous N/A Exudate Color: red, brown red, brown N/A Wound Margin: Flat and Intact Flat and Intact N/A Granulation Amount: Small (1-33%) Small (1-33%) N/A Granulation Quality: Red Red N/A Necrotic Amount: Small (1-33%) Medium (34-66%) N/A Exposed Structures: Fat Layer (Subcutaneous Tissue) Fat Layer (Subcutaneous Tissue) N/A Exposed: Yes Exposed: Yes Fascia: No Fascia: No Tendon: No Tendon:  No Muscle: No Muscle: No Joint: No Joint: No Bone: No Bone: No Epithelialization: None N/A N/A Treatment Notes Electronic Signature(s) Signed: 11/01/2019 4:18:47 PM By: [name] Entered By: [name] on 11/01/2019 10:48:03 Buccellato, Malachy [name] ([id number]) -------------------------------------------------------------------------------- Multi-Disciplinary Care Plan Details Patient Name: [name] Date of Service: 11/01/2019 9:15 AM Medical Record Number: [id number] Patient Account Number: [id number] Date of Birth/Sex: Mar 29, 1965 (54 y.o. F) Treating RN: [name] Primary Care Liberty Seto: [name] Other Clinician: Referring Micah Barnier: Referral, Self Treating Doyne Ellinger/Extender: [name] in Treatment: 0 Active Inactive Electronic Signature(s) Signed: 11/01/2019 4:18:47 PM By: [name] Entered By: [name] on 11/01/2019 10:46:03 Billingham, Malachy [name] ([id number]) -------------------------------------------------------------------------------- Pain Assessment Details Patient Name: [name] Date of Service: 11/01/2019 9:15 AM Medical Record Number: [id number] Patient Account Number: [id number] Date of Birth/Sex: [birth year] (53 y.o. F) Treating RN: [name] Primary Care Primo Innis: [name] Other Clinician: Referring Tyniah Kastens: Referral, Self Treating Faizah Kandler/Extender: [name] in Treatment: 0 Active Problems Location of Pain Severity and Description of Pain Patient Has Paino Yes Site Locations Pain Location: Pain in Ulcers Duration of the Pain. Constant / Intermittento Intermittent Rate the pain. Current Pain Level: 7 Character of Pain Describe the Pain: Stabbing Pain Management and Medication Current Pain Management: Electronic Signature(s) Signed: 11/01/2019 4:18:47 PM By: [name] Entered By: [name] on 11/01/2019 09:46:53 Wigle, Malachy [name]  ([id number]) -------------------------------------------------------------------------------- Patient/Caregiver Education Details Patient Name: [name] Date of Service: 11/01/2019 9:15 AM Medical Record Number: [id number] Patient Account Number: [id number] Date of Birth/Gender: 1965/05/05 (55 y.o. F) Treating RN: [name] Primary Care Physician: [name] Other Clinician: Referring Physician: Referral, Self Treating Physician/Extender: [name] in Treatment: 0 Education Assessment Education Provided To: Patient Education Topics Provided Welcome To The [facility]: Handouts: Welcome To [facility] Methods: Explain/Verbal Responses: State content correctly Wound/Skin Impairment: Handouts: Caring for Your Ulcer Methods: Explain/Verbal Electronic Signature(s) Signed: 11/01/2019 4:18:47 PM By: [name] Entered By: [name] on 11/01/2019 10:56:57 Vensel, Rosealie ([id number]) -------------------------------------------------------------------------------- Wound Assessment Details Patient Name: [name] Date of Service: 11/01/2019 9:15 AM Medical Record Number: [id number] Patient Account Number: [id number] Date of Birth/Sex: 1966/01/10 (54 y.o. F) Treating RN: [name] Primary Care Aamina Skiff: [name] Other Clinician: Referring Tyler Cubit: Referral, Self Treating Mackenzee Becvar/Extender: [name] in Treatment: 0 Wound Status Wound Number: 4 Primary Venous Leg Ulcer Etiology: Wound Location: Left, Lateral Lower Leg Secondary Diabetic Wound/Ulcer of the Lower Extremity Wounding Event: Gradually Appeared Etiology: Date Acquired: 09/16/2019 Wound Open Weeks Of Treatment: 0 Status: Clustered Wound: No Comorbid Congestive Heart Failure, Coronary Artery Disease, History: Hypertension, Peripheral Venous Disease, Type II Diabetes, End Stage Renal Disease, Neuropathy Photos Wound Measurements Length: (cm)  1.2 % Re Width: (cm) 1 % Re Depth: (cm) 0.1 Epit Area: (cm) 0.942 Tun Volume: (cm) 0.094 Und duction in Area: duction in Volume: helialization: None neling: No ermining: No Wound Description Classification: Partial Thickness Fou Wound Margin: Flat and Intact Slo Exudate Amount: Medium Exudate Type: Serosanguineous Exudate Color: red, brown l Odor After Cleansing: No ugh/Fibrino Yes Wound Bed Granulation Amount: Small (1-33%) Exposed Structure  Granulation Quality: Red Fascia Exposed: No Necrotic Amount: Small (1-33%) Fat Layer (Subcutaneous Tissue) Exposed: Yes Necrotic Quality: Adherent Slough Tendon Exposed: No Muscle Exposed: No Joint Exposed: No Bone Exposed: No Treatment Notes Wound #4 (Left, Lateral Lower Leg) Notes SCell, abd, 3LL Shvartsman, Connelly ([id number]) Electronic Signature(s) Signed: 11/01/2019 4:18:47 PM By: [name] Entered By: [name] on 11/01/2019 10:13:58 Keasling, Malachy [name] ([id number]) -------------------------------------------------------------------------------- Wound Assessment Details Patient Name: [name] Date of Service: 11/01/2019 9:15 AM Medical Record Number: [id number] Patient Account Number: [id number] Date of Birth/Sex: 06/03/1965 (54 y.o. F) Treating RN: [name] Primary Care Ziva Nunziata: [name] Other Clinician: Referring Arohi Salvatierra: Referral, Self Treating Erskine Steinfeldt/Extender: [name] in Treatment: 0 Wound Status Wound Number: 5 Primary Venous Leg Ulcer Etiology: Wound Location: Left, Posterior Lower Leg Secondary Diabetic Wound/Ulcer of the Lower Extremity Wounding Event: Gradually Appeared Etiology: Date Acquired: 09/16/2019 Wound Open Weeks Of Treatment: 0 Status: Clustered Wound: No Comorbid Congestive Heart Failure, Coronary Artery Disease, History: Hypertension, Peripheral Venous Disease, Type II Diabetes, End Stage Renal Disease, Neuropathy Photos Wound Measurements Length:  (cm) 1.5 % R Width: (cm) 1.2 % R Depth: (cm) 0.1 Tun Area: (cm) 1.414 Un Volume: (cm) 0.141 eduction in Area: eduction in Volume: neling: No dermining: No Wound Description Classification: Partial Thickness Fo Wound Margin: Flat and Intact Sl Exudate Amount: Medium Exudate Type: Serosanguineous Exudate Color: red, brown ul Odor After Cleansing: No ough/Fibrino Yes Wound Bed Granulation Amount: Small (1-33%) Exposed Structure Granulation Quality: Red Fascia Exposed: No Necrotic Amount: Medium (34-66%) Fat Layer (Subcutaneous Tissue) Exposed: Yes Necrotic Quality: Adherent Slough Tendon Exposed: No Muscle Exposed: No Joint Exposed: No Bone Exposed: No Treatment Notes Wound #5 (Left, Posterior Lower Leg) Notes SCell, abd, 3LL Yoshida, Alexis ([id number]) Electronic Signature(s) Signed: 11/01/2019 4:18:47 PM By: [name] Entered By: [name] on 11/01/2019 10:15:39 Arvelo, Malachy [name] ([id number]) -------------------------------------------------------------------------------- Vitals Details Patient Name: [name] Date of Service: 11/01/2019 9:15 AM Medical Record Number: [id number] Patient Account Number: [id number] Date of Birth/Sex: 11-24-1965 (54 y.o. F) Treating RN: [name] Primary Care Allysen Lazo: [name] Other Clinician: Referring Yanna Leaks: Referral, Self Treating Tayten Heber/Extender: [name] in Treatment: 0 Vital Signs Time Taken: 09:46 Temperature (F): 98.7 Height (in): 67 Pulse (bpm): 76 Source: Stated Respiratory Rate (breaths/min): 18 Weight (lbs): 317 Blood Pressure (mmHg): 130/47 Source: Stated Reference Range: 80 - 120 mg / dl Body Mass Index (BMI): 49.6 Electronic Signature(s) Signed: 11/01/2019 4:18:47 PM By: [name] Entered By: [name] on 11/01/2019 10:19:02

## 2019-11-08 ENCOUNTER — Other Ambulatory Visit: Payer: Self-pay

## 2019-11-08 ENCOUNTER — Encounter: Payer: Medicare Other | Admitting: Internal Medicine

## 2019-11-08 DIAGNOSIS — L97221 Non-pressure chronic ulcer of left calf limited to breakdown of skin: Secondary | ICD-10-CM | POA: Diagnosis not present

## 2019-11-09 ENCOUNTER — Ambulatory Visit: Payer: Medicare Other | Admitting: Physician Assistant

## 2019-11-10 NOTE — Progress Notes (Signed)
[name] ([id number]) Visit Report for 11/08/2019 Arrival Information Details Patient Name: [name], [name] Date of Service: 11/08/2019 3:30 PM Medical Record Number: [id number] Patient Account Number: [id number] Date of Birth/Sex: May 07, 1965 (54 y.o. F) Treating RN: [name] Primary Care [name]: [name] Other Clinician: Referring [name]: [name] Treating Janaria Mccammon/Extender: [name] in Treatment: 1 Visit Information History Since Last Visit All ordered tests and consults were completed: No Patient Arrived: Ambulatory Added or deleted any medications: No Arrival Time: 15:49 Any new allergies or adverse reactions: No Accompanied By: self Had a fall or experienced change in Yes Transfer Assistance: None activities of daily living that may affect risk of falls: Signs or symptoms of abuse/neglect since last visito No Hospitalized since last visit: No Implantable device outside of the clinic excluding No cellular tissue based products placed in the center since last visit: Has Dressing in Place as Prescribed: Yes Has Compression in Place as Prescribed: Yes Pain Present Now: No Notes States she fell on her knees yesterday Coming up her porch steps. Denies injury. Electronic Signature(s) Signed: 11/09/2019 10:49:47 AM By: [name] Entered By: [name] on 11/08/2019 16:01:20 [name] ([id number]) -------------------------------------------------------------------------------- Compression Therapy Details Patient Name: [name] Date of Service: 11/08/2019 3:30 PM Medical Record Number: [id number] Patient Account Number: [id number] Date of Birth/Sex: 1965-10-17 (54 y.o. F) Treating RN: [name] Primary Care Maddax Palinkas: [name] Other Clinician: Referring Staley Budzinski: [name] Treating Kiely Cousar/Extender: [name] in Treatment: 1 Compression Therapy Performed for Wound Assessment: Wound #4 Left,Lateral Lower  Leg Performed By: Clinician [name] Barman, RN Compression Type: Three Layer Pre Treatment ABI: 1 Post Procedure Diagnosis Same as Pre-procedure Electronic Signature(s) Signed: 11/08/2019 6:14:51 PM By: [name], BSN, RN, CWS, Kim RN, BSN Entered By: [name], BSN, RN, CWS, Kim on 11/08/2019 18:14:51 [name] ([id number]) -------------------------------------------------------------------------------- Compression Therapy Details Patient Name: [name] Date of Service: 11/08/2019 3:30 PM Medical Record Number: [id number] Patient Account Number: [id number] Date of Birth/Sex: 06/27/1965 (54 y.o. F) Treating RN: [name] Primary Care Jacobi Nile: [name] Other Clinician: Referring Gregoire Bennis: [name] Treating Huda Petrey/Extender: [name] in Treatment: 1 Compression Therapy Performed for Wound Assessment: Wound #5 Left,Posterior Lower Leg Performed By: Clinician [name] Barman, RN Compression Type: Three Layer Pre Treatment ABI: 1 Post Procedure Diagnosis Same as Pre-procedure Electronic Signature(s) Signed: 11/08/2019 6:14:51 PM By: [name], BSN, RN, CWS, Kim RN, BSN Entered By: [name], BSN, RN, CWS, Kim on 11/08/2019 18:14:51 [name] ([id number]) -------------------------------------------------------------------------------- Encounter Discharge Information Details Patient Name: [name] Date of Service: 11/08/2019 3:30 PM Medical Record Number: [id number] Patient Account Number: [id number] Date of Birth/Sex: 12/02/1965 (54 y.o. F) Treating RN: [name] Primary Care Camil Wilhelmsen: [name] Other Clinician: Referring [name]: [name] Treating Kayslee Furey/Extender: [name] in Treatment: 1 Encounter Discharge Information Items Discharge Condition: Stable Ambulatory Status: Ambulatory Discharge Destination: Home Transportation: Private Auto Accompanied By: self Schedule Follow-up Appointment: Yes Clinical Summary of Care: Electronic  Signature(s) Signed: 11/08/2019 5:46:02 PM By: [name], BSN, RN, CWS, Kim RN, BSN Entered By: [name], BSN, RN, CWS, Kim on 11/08/2019 17:46:02 [name] ([id number]) -------------------------------------------------------------------------------- Lower Extremity Assessment Details Patient Name: [name] Date of Service: 11/08/2019 3:30 PM Medical Record Number: [id number] Patient Account Number: [id number] Date of Birth/Sex: 1966-01-19 (54 y.o. F) Treating RN: [name] Primary Care Melvena Vink: [name] Other Clinician: Referring [name]: [name] Treating Rorey Hodges/Extender: [name] in Treatment: 1 Edema Assessment Assessed: [Left: No] [Right: No] [Left: Edema] [Right: :] Calf Left:  Right: Point of Measurement: 33 cm From Medial Instep 41 cm 37.5 cm Ankle Left: Right: Point of Measurement: 10 cm From Medial Instep 26 cm 24 cm Vascular Assessment Pulses: Dorsalis Pedis Palpable: [Left:Yes] [Right:Yes] Posterior Tibial Palpable: [Left:Yes] [Right:Yes] Electronic Signature(s) Signed: 11/09/2019 10:49:47 AM By: [name] Signed: 11/09/2019 6:39:29 PM By: [name], BSN, RN, CWS, Kim RN, BSN Entered By: [name] on 11/08/2019 15:59:56 [name] ([id number]) -------------------------------------------------------------------------------- Multi Wound Chart Details Patient Name: [name] Date of Service: 11/08/2019 3:30 PM Medical Record Number: [id number] Patient Account Number: [id number] Date of Birth/Sex: Aug 06, 1965 (54 y.o. 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 N/A Extremity Extremity Comorbid History: Congestive Heart Failure, Coronary Congestive Heart Failure, Coronary N/A Artery Disease, Hypertension, Artery Disease, Hypertension, Peripheral Venous Disease, Type II Peripheral Venous Disease, Type II Diabetes, End Stage Renal Disease, Diabetes, End Stage Renal Disease, Neuropathy Neuropathy Date Acquired: 09/16/2019 09/16/2019 N/A Weeks of Treatment: 1 1 N/A Wound Status: Open Open N/A Measurements L x W x D (cm) 1x0.6x0.1 1x0.6x0.1 N/A Area (cm) : 0.471 0.471 N/A Volume (cm) : 0.047 0.047 N/A % Reduction in Area: 50.00% 66.70% N/A % Reduction in Volume: 50.00% 66.70% N/A Classification: Partial Thickness Partial Thickness N/A Exudate Amount: Medium Medium N/A Exudate Type: Serosanguineous Serosanguineous N/A Exudate Color: red, brown red, brown N/A Wound Margin: Flat and Intact Flat and Intact N/A Granulation Amount: Small (1-33%) Small (1-33%) N/A Granulation Quality: Red Red N/A Necrotic Amount: Small (1-33%) Medium (34-66%) N/A Exposed Structures: Fat Layer (Subcutaneous Tissue) Fat Layer (Subcutaneous Tissue) N/A Exposed: Yes Exposed: Yes Fascia: No Fascia: No Tendon: No Tendon: No Muscle: No Muscle: No Joint: No Joint: No Bone: No Bone: No Epithelialization: None N/A N/A Treatment Notes Electronic Signature(s) Signed: 11/08/2019 4:48:02 PM By: Linton Ham MD Entered By: Linton Ham on 11/08/2019 16:37:20 Copus, [name] ([id number]) BROOK, GERACI ([id number]) -------------------------------------------------------------------------------- Multi-Disciplinary Care Plan Details Patient Name: [name] Date of Service: 11/08/2019 3:30 PM Medical Record Number:  [id number] Patient Account Number: [id number] Date of Birth/Sex: 1966-02-18 (54 y.o. F) Treating RN: [name] Primary Care September Mormile: [name] Other Clinician: Referring Shell Blanchette: [name] Treating Zeniyah Peaster/Extender: [name] in Treatment: 1 Active Inactive Orientation to the Wound Care Program Nursing Diagnoses: Knowledge deficit related to the wound healing center program Goals: Patient/caregiver will verbalize understanding of the [facility] Date Initiated: 11/08/2019 Target Resolution Date: 11/08/2019 Goal Status: Active Interventions: Provide education on orientation to the wound center Notes: Venous Leg Ulcer Nursing Diagnoses: Knowledge deficit related to disease process and management Goals: Patient will maintain optimal edema control Date Initiated: 11/08/2019 Target Resolution Date: 11/15/2019 Goal Status: Active Interventions: Assess peripheral edema status every visit. Treatment Activities: Therapeutic compression applied : 11/08/2019 Notes: Wound/Skin Impairment Nursing Diagnoses: Impaired tissue integrity Goals: Patient/caregiver will verbalize understanding of skin care regimen Date Initiated: 11/08/2019 Target Resolution Date: 11/15/2019 Goal Status: Active Interventions: Assess ulceration(s) every visit Treatment Activities: Skin care regimen initiated : 11/08/2019 Topical wound management initiated : 11/08/2019 Notes: Electronic Signature(s) Signed: 11/08/2019 5:41:38 PM By:  [name], BSN, RN, CWS, Leisure centre manager, BSN Previous Signature: 11/08/2019 5:40:48 PM Version By: [name] BSN, RN, CWS, Kim RN, BSN [place], Tula ([id number]) Entered By: [name], BSN, RN, CWS, Kim on 11/08/2019 17:41:37 Schreck, [name] ([id number]) -------------------------------------------------------------------------------- Pain Assessment Details Patient Name: [name] Date of Service: 11/08/2019 3:30 PM Medical Record Number: [id number] Patient  Account Number: [id number] Date of Birth/Sex: July 14, 1965 (54 y.o. F) Treating RN: [name] Primary Care Luvern Mischke: [name] Other Clinician: Referring Aria Jarrard: [name] Treating Riggs Dineen/Extender: [name] in Treatment: 1 Active Problems Location of Pain Severity and Description of Pain Patient Has Paino No Site Locations With Dressing Change: No Pain Management and Medication Current Pain Management: Electronic Signature(s) Signed: 11/09/2019 10:49:47 AM By: [name] Signed: 11/09/2019 6:39:29 PM By: [name], BSN, RN, CWS, Kim RN, BSN Entered By: [name] on 11/08/2019 15:50:43 Geng, [name] ([id number]) -------------------------------------------------------------------------------- Patient/Caregiver Education Details Patient Name: [name] Date of Service: 11/08/2019 3:30 PM Medical Record Number: [id number] Patient Account Number: [id number] Date of Birth/Gender: 10-23-1965 (54 y.o. F) Treating RN: [name] Primary Care Physician: [name] Other Clinician: Referring Physician: Alean [name] Treating Physician/Extender: [name] in Treatment: 1 Education Assessment Education Provided To: Patient Education Topics Provided Venous: Handouts: Controlling Swelling with Compression Stockings Methods: Demonstration, Explain/Verbal Responses: State content correctly Wound/Skin Impairment: Handouts: Caring for Your Ulcer Methods: Demonstration, Explain/Verbal Responses: State content correctly Electronic Signature(s) Signed: 11/09/2019 6:39:29 PM By: [name], BSN, RN, CWS, Kim RN, BSN Entered By: [name], BSN, RN, CWS, Kim on 11/08/2019 17:45:23 Aina, [name] ([id number]) -------------------------------------------------------------------------------- Wound Assessment Details Patient Name: [name] Date of Service: 11/08/2019 3:30 PM Medical Record Number: [id number] Patient Account Number: [id number] Date of  Birth/Sex: 05-06-1965 (54 y.o. F) Treating RN: [name] Primary Care Odas Ozer: [name] Other Clinician: Referring Ceferino Lang: [name] Treating Sennie Borden/Extender: [name] in Treatment: 1 Wound Status Wound Number: 4 Primary Venous Leg Ulcer Etiology: Wound Location: Left, Lateral Lower Leg Secondary Diabetic Wound/Ulcer of the Lower Extremity Wounding Event: Gradually Appeared Etiology: Date Acquired: 09/16/2019 Wound Open Weeks Of Treatment: 1 Status: Clustered Wound: No Comorbid Congestive Heart Failure, Coronary Artery Disease, History: Hypertension, Peripheral Venous Disease, Type II Diabetes, End Stage Renal Disease, Neuropathy Photos Wound Measurements Length: (cm) 1 % R Width: (cm) 0.6 % R Depth: (cm) 0.1 Epi Area: (cm) 0.471 Volume: (cm) 0.047 eduction in Area: 50% eduction in Volume: 50% thelialization: None Wound Description Classification: Partial Thickness Fou Wound Margin: Flat and Intact Slo Exudate Amount: Medium Exudate Type: Serosanguineous Exudate Color: red, brown l Odor After Cleansing: No ugh/Fibrino Yes Wound Bed Granulation Amount: Small (1-33%) Exposed Structure Granulation Quality: Red Fascia Exposed: No Necrotic Amount: Small (1-33%) Fat Layer (Subcutaneous Tissue) Exposed: Yes Necrotic Quality: Adherent Slough Tendon Exposed: No Muscle Exposed: No Joint Exposed: No Bone Exposed: No Treatment Notes Wound #4 (Left, Lateral Lower Leg) Notes SCell, abd, 3LL Fuhr, Adelei ([id number]) Electronic Signature(s) Signed: 11/09/2019 10:49:47 AM By: [name] Signed: 11/09/2019 6:39:29 PM By: [name], BSN, RN, CWS, Kim RN, BSN Entered By: [name] on 11/08/2019 15:56:54 Blalock, Ariella ([id number]) -------------------------------------------------------------------------------- Wound Assessment Details Patient Name: [name] Date of Service: 11/08/2019 3:30 PM Medical Record Number:  [id number] Patient Account Number: [id number] Date of Birth/Sex: 07-21-1965 (54 y.o. F) Treating RN: [name] Primary Care Ksenia Kunz: [name] Other Clinician: Referring Krystle Polcyn: [name] Treating Shams Fill/Extender: [name] in Treatment: 1 Wound Status Wound Number: 5 Primary Venous Leg Ulcer Etiology: Wound Location: Left, Posterior Lower Leg Secondary  Diabetic Wound/Ulcer of the Lower Extremity Wounding Event: Gradually Appeared Etiology: Date Acquired: 09/16/2019 Wound Open Weeks Of Treatment: 1 Status: Clustered Wound: No Comorbid Congestive Heart Failure, Coronary Artery Disease, History: Hypertension, Peripheral Venous Disease, Type II Diabetes, End Stage Renal Disease, Neuropathy Photos Wound Measurements Length: (cm) 1 Width: (cm) 0.6 Depth: (cm) 0.1 Area: (cm) 0.471 Volume: (cm) 0.047 % Reduction in Area: 66.7% % Reduction in Volume: 66.7% Wound Description Classification: Partial Thickness Fo Wound Margin: Flat and Intact Sl Exudate Amount: Medium Exudate Type: Serosanguineous Exudate Color: red, brown ul Odor After Cleansing: No ough/Fibrino Yes Wound Bed Granulation Amount: Small (1-33%) Exposed Structure Granulation Quality: Red Fascia Exposed: No Necrotic Amount: Medium (34-66%) Fat Layer (Subcutaneous Tissue) Exposed: Yes Necrotic Quality: Adherent Slough Tendon Exposed: No Muscle Exposed: No Joint Exposed: No Bone Exposed: No Treatment Notes Wound #5 (Left, Posterior Lower Leg) Notes SCell, abd, 3LL Vegh, Jawana ([id number]) Electronic Signature(s) Signed: 11/09/2019 10:49:47 AM By: [name] Signed: 11/09/2019 6:39:29 PM By: [name], BSN, RN, CWS, Kim RN, BSN Entered By: [name] on 11/08/2019 15:57:20 Redder, [name] ([id number]) -------------------------------------------------------------------------------- Epps Details Patient Name: [name] Date of Service: 11/08/2019 3:30 PM Medical  Record Number: [id number] Patient Account Number: [id number] Date of Birth/Sex: 1965-12-17 (54 y.o. F) Treating RN: [name] Primary Care Zhana Jeangilles: [name] Other Clinician: Referring Reshaun Briseno: [name] Treating Rashell Shambaugh/Extender: [name] in Treatment: 1 Vital Signs Time Taken: 03:45 Temperature (F): 98.7 Height (in): 67 Pulse (bpm): 88 Weight (lbs): 317 Respiratory Rate (breaths/min): 20 Body Mass Index (BMI): 49.6 Blood Pressure (mmHg): 116/43 Reference Range: 80 - 120 mg / dl Electronic Signature(s) Signed: 11/09/2019 10:49:47 AM By: [name] Entered By: [name] on 11/08/2019 15:50:34

## 2019-11-10 NOTE — Progress Notes (Signed)
[name], [name] ([id number]) Visit Report for 11/08/2019 HPI Details Patient Name: [name], [name] Date of Service: 11/08/2019 3:30 PM Medical Record Number: [id number] Patient Account Number: [id number] Date of Birth/Sex: 1965/06/24 (54 y.o. F) Treating RN: Cornell Barman Primary Care Provider: Alean Rinne Other Clinician: Referring Provider: Alean Rinne Treating Provider/Extender: Tito Dine in Treatment: 1 History of Present Illness HPI Description: 10/24/2018 on evaluation today patient presents for initial evaluation or clinic concerning issues that she has been having with lymphedema for quite some time. Unfortunately she has several wound openings at this point that secondary to her lymphedema/venous stasis are giving her trouble and leaking quite severely. She also has diabetes along with hypertension and stage III chronic kidney disease. Fortunately there is no signs of active infection at this time. She is going to likely require debridement of the left leg ulcer upon evaluation today just based on what I am seeing. Fortunately there is no wound opening on the right. Overall the patient seems to be doing quite well and again there is no evidence of systemic infection which is good news. No fevers, chills, nausea, vomiting, or diarrhea. 10/31/2018 on evaluation today patient actually appears to be doing excellent in regard to her lower extremity ulcers. She has been tolerating the dressing changes without complication. Fortunately there is no signs of active infection at this time. She has tolerated 3 layer compression wrap without complication. 11/07/2018 upon evaluation today patient actually appears to be doing very well with regard to her left lower extremity ulcers. She has been tolerating the dressing changes without complication. Fortunately there is no signs of active infection. No fevers, chills, nausea, vomiting, or diarrhea. 11/21/2018 upon evaluation today patient  appears to be doing quite well with regard to her lower extremity ulcers. In fact both areas seem to be showing signs of good improvement which is excellent. She is not having as much pain as she has in the past and again has a lot of healing compared to previous visits as well. 12/05/2018 on evaluation today patient presents for follow-up concerning her ongoing issues with her bilateral lower extremity ulcers. The good news is her right lower extremity is showing signs of completely healing at this time which is great news. Fortunately there is no evidence of active infection. On the left she has just a very small area still remaining that is open at this time all in all she is very close to complete closure in my opinion. She does have compression stockings to wear at home. 12/12/2018 on evaluation today patient appears to be doing excellent in regard to her left lateral lower extremity ulcer. She has been tolerating the dressing changes without complication. Fortunately there is no signs of active infection at this time. In fact this appears to be pretty much healed at this point although again she is not 100% today. No fevers, chills, nausea, vomiting, or diarrhea. 12/19/2018 on evaluation today patient actually appears to be doing excellent with regard to her lower extremity ulcer in fact this appears to be completely healed today which is all some. She has done extremely well with wound care measures. No fevers, chills, nausea, vomiting, or diarrhea. ------------------------------------ 11/01/19-Readmission to the clinic Patient presents with left leg pain, wounds posterior calf, onset about 4 weeks, denies any fevers chills or shakes, has not been using anything to these wounds, was given compression stockings at last discharge from the clinic but has not been able to use them as they rolled down and  cause creases Patient's history significant for type 2 diabetes insulin requiring, A1c of 6.9  lately, hypertension, chronic pain 8/18; patient readmitted that the clinic last week. She has wounds on her left lateral posterior lower leg all of this in close juxtaposition i.e. a localized site. We've been using silver alginate under 3 layer compression apparently the wound surface area is better. She was wearing compression stockings from elastic therapy but says they were falling down. As she progresses more towards healing will need to address what we use in terms of compression stockings perhaps external compression garments Electronic Signature(s) Signed: 11/08/2019 4:48:02 PM By: Linton Ham MD Entered By: Linton Ham on 11/08/2019 16:38:34 Lokken, [name] ([id number]) -------------------------------------------------------------------------------- Physical Exam Details Patient Name: [name] Date of Service: 11/08/2019 3:30 PM Medical Record Number: [id number] Patient Account Number: [id number] Date of Birth/Sex: Aug 12, 1965 (54 y.o. F) Treating RN: Cornell Barman Primary Care Provider: Alean Rinne Other Clinician: Referring Provider: Alean Rinne Treating Provider/Extender: Tito Dine in Treatment: 1 Constitutional Sitting or standing Blood Pressure is within target range for patient.. Pulse regular and within target range for patient.Marland Kitchen Respirations regular, non- labored and within target range.. Temperature is normal and within the target range for the patient.Marland Kitchen appears in no distress. Cardiovascular Pedal pulse on the left is palpable. Patient has edema left greater than right. Changes of chronic venous hypertension and secondary lymphedema. Nonpitting edema. Notes Wound exam; left lower extremity changes of chronic venous hypertension with secondary lymphedema. Skin has a hyperkeratotic liquidy consistency compatible with stage III lymphedema. The patient has 2 wound areas laterally and posteriorly on the left towards the upper part of her Achilles.  There is still a lot of drainage here the area looks moist but no current evidence of infection Electronic Signature(s) Signed: 11/08/2019 4:48:02 PM By: Linton Ham MD Entered By: Linton Ham on 11/08/2019 16:40:46 Alton, [name] ([id number]) -------------------------------------------------------------------------------- Physician Orders Details Patient Name: [name] Date of Service: 11/08/2019 3:30 PM Medical Record Number: [id number] Patient Account Number: [id number] Date of Birth/Sex: 04-26-1965 (55 y.o. F) Treating RN: Cornell Barman Primary Care Provider: Alean Rinne Other Clinician: Referring Provider: Alean Rinne Treating Provider/Extender: Tito Dine in Treatment: 1 Verbal / Phone Orders: No Diagnosis Coding Wound Cleansing Wound #4 Left,Lateral Lower Leg o May shower with protection. Wound #5 Left,Posterior Lower Leg o May shower with protection. Skin Barriers/Peri-Wound Care Wound #4 Left,Lateral Lower Leg o Moisturizing lotion o Triamcinolone Acetonide Ointment (TCA) o Moisturizing lotion o Triamcinolone Acetonide Ointment (TCA) Wound #5 Left,Posterior Lower Leg o Moisturizing lotion o Triamcinolone Acetonide Ointment (TCA) o Moisturizing lotion o Triamcinolone Acetonide Ointment (TCA) Primary Wound Dressing Wound #4 Left,Lateral Lower Leg o Silver Alginate Wound #5 Left,Posterior Lower Leg o Silver Alginate Secondary Dressing Wound #4 Left,Lateral Lower Leg o ABD pad Wound #5 Left,Posterior Lower Leg o ABD pad Dressing Change Frequency Wound #4 Left,Lateral Lower Leg o Change dressing every week Wound #5 Left,Posterior Lower Leg o Change dressing every week Follow-up Appointments Wound #4 Left,Lateral Lower Leg o Return Appointment in 1 week. Wound #5 Left,Posterior Lower Leg o Return Appointment in 1 week. Edema Control Wound #4 Left,Lateral Lower Leg o 3 Layer Compression System - Left  Lower Extremity o Elevate legs to the level of the heart and pump ankles as often as possible Wound #5 Left,Posterior Lower Leg o 3 Layer Compression System - Left Lower Extremity Gaudin, Kailany ([id number]) o Elevate legs to the level of the heart and pump ankles as often as possible  Electronic Signature(s) Signed: 11/08/2019 4:48:02 PM By: Linton Ham MD Signed: 11/09/2019 6:39:29 PM By: Gretta Cool BSN, RN, CWS, Kim RN, BSN Entered By: Gretta Cool, BSN, RN, CWS, Kim on 11/08/2019 16:19:39 ANAHLA, BEVIS ([id number]) -------------------------------------------------------------------------------- Problem List Details Patient Name: [name] Date of Service: 11/08/2019 3:30 PM Medical Record Number: [id number] Patient Account Number: [id number] Date of Birth/Sex: 02-02-1966 (54 y.o. F) Treating RN: Cornell Barman Primary Care Provider: Alean Rinne Other Clinician: Referring Provider: Alean Rinne Treating Provider/Extender: Tito Dine in Treatment: 1 Active Problems ICD-10 Encounter Code Description Active Date MDM Diagnosis L97.221 Non-pressure chronic ulcer of left calf limited to breakdown of skin 11/01/2019 No Yes I87.2 Venous insufficiency (chronic) (peripheral) 11/01/2019 No Yes Inactive Problems Resolved Problems Electronic Signature(s) Signed: 11/08/2019 4:48:02 PM By: Linton Ham MD Entered By: Linton Ham on 11/08/2019 16:37:08 Greulich, [name] ([id number]) -------------------------------------------------------------------------------- Progress Note Details Patient Name: [name] Date of Service: 11/08/2019 3:30 PM Medical Record Number: [id number] Patient Account Number: [id number] Date of Birth/Sex: 02/12/1966 (54 y.o. F) Treating RN: Cornell Barman Primary Care Provider: Alean Rinne Other Clinician: Referring Provider: Alean Rinne Treating Provider/Extender: Tito Dine in Treatment: 1 Subjective History of Present Illness  (HPI) 10/24/2018 on evaluation today patient presents for initial evaluation or clinic concerning issues that she has been having with lymphedema for quite some time. Unfortunately she has several wound openings at this point that secondary to her lymphedema/venous stasis are giving her trouble and leaking quite severely. She also has diabetes along with hypertension and stage III chronic kidney disease. Fortunately there is no signs of active infection at this time. She is going to likely require debridement of the left leg ulcer upon evaluation today just based on what I am seeing. Fortunately there is no wound opening on the right. Overall the patient seems to be doing quite well and again there is no evidence of systemic infection which is good news. No fevers, chills, nausea, vomiting, or diarrhea. 10/31/2018 on evaluation today patient actually appears to be doing excellent in regard to her lower extremity ulcers. She has been tolerating the dressing changes without complication. Fortunately there is no signs of active infection at this time. She has tolerated 3 layer compression wrap without complication. 11/07/2018 upon evaluation today patient actually appears to be doing very well with regard to her left lower extremity ulcers. She has been tolerating the dressing changes without complication. Fortunately there is no signs of active infection. No fevers, chills, nausea, vomiting, or diarrhea. 11/21/2018 upon evaluation today patient appears to be doing quite well with regard to her lower extremity ulcers. In fact both areas seem to be showing signs of good improvement which is excellent. She is not having as much pain as she has in the past and again has a lot of healing compared to previous visits as well. 12/05/2018 on evaluation today patient presents for follow-up concerning her ongoing issues with her bilateral lower extremity ulcers. The good news is her right lower extremity is showing  signs of completely healing at this time which is great news. Fortunately there is no evidence of active infection. On the left she has just a very small area still remaining that is open at this time all in all she is very close to complete closure in my opinion. She does have compression stockings to wear at home. 12/12/2018 on evaluation today patient appears to be doing excellent in regard to her left lateral lower extremity ulcer. She has been tolerating the dressing changes  without complication. Fortunately there is no signs of active infection at this time. In fact this appears to be pretty much healed at this point although again she is not 100% today. No fevers, chills, nausea, vomiting, or diarrhea. 12/19/2018 on evaluation today patient actually appears to be doing excellent with regard to her lower extremity ulcer in fact this appears to be completely healed today which is all some. She has done extremely well with wound care measures. No fevers, chills, nausea, vomiting, or diarrhea. ------------------------------------ 11/01/19-Readmission to the clinic Patient presents with left leg pain, wounds posterior calf, onset about 4 weeks, denies any fevers chills or shakes, has not been using anything to these wounds, was given compression stockings at last discharge from the clinic but has not been able to use them as they rolled down and cause creases Patient's history significant for type 2 diabetes insulin requiring, A1c of 6.9 lately, hypertension, chronic pain 8/18; patient readmitted that the clinic last week. She has wounds on her left lateral posterior lower leg all of this in close juxtaposition i.e. a localized site. We've been using silver alginate under 3 layer compression apparently the wound surface area is better. She was wearing compression stockings from elastic therapy but says they were falling down. As she progresses more towards healing will need to address what we use in  terms of compression stockings perhaps external compression garments Objective Constitutional Sitting or standing Blood Pressure is within target range for patient.. Pulse regular and within target range for patient.Marland Kitchen Respirations regular, non- labored and within target range.. Temperature is normal and within the target range for the patient.Marland Kitchen appears in no distress. Vitals Time Taken: 3:45 AM, Height: 67 in, Weight: 317 lbs, BMI: 49.6, Temperature: 98.7 F, Pulse: 88 bpm, Respiratory Rate: 20 breaths/min, Blood Pressure: 116/43 mmHg. Cardiovascular Pedal pulse on the left is palpable. Patient has edema left greater than right. Changes of chronic venous hypertension and secondary Leitzke, Rayhana ([id number]) lymphedema. Nonpitting edema. General Notes: Wound exam; left lower extremity changes of chronic venous hypertension with secondary lymphedema. Skin has a hyperkeratotic liquidy consistency compatible with stage III lymphedema. The patient has 2 wound areas laterally and posteriorly on the left towards the upper part of her Achilles. There is still a lot of drainage here the area looks moist but no current evidence of infection Integumentary (Hair, Skin) Wound #4 status is Open. Original cause of wound was Gradually Appeared. The wound is located on the Left,Lateral Lower Leg. The wound measures 1cm length x 0.6cm width x 0.1cm depth; 0.471cm^2 area and 0.047cm^3 volume. There is Fat Layer (Subcutaneous Tissue) Exposed exposed. There is a medium amount of serosanguineous drainage noted. The wound margin is flat and intact. There is small (1-33%) red granulation within the wound bed. There is a small (1-33%) amount of necrotic tissue within the wound bed including Adherent Slough. Wound #5 status is Open. Original cause of wound was Gradually Appeared. The wound is located on the Left,Posterior Lower Leg. The wound measures 1cm length x 0.6cm width x 0.1cm depth; 0.471cm^2 area and 0.047cm^3  volume. There is Fat Layer (Subcutaneous Tissue) Exposed exposed. There is a medium amount of serosanguineous drainage noted. The wound margin is flat and intact. There is small (1-33%) red granulation within the wound bed. There is a medium (34-66%) amount of necrotic tissue within the wound bed including Adherent Slough. Assessment Active Problems ICD-10 Non-pressure chronic ulcer of left calf limited to breakdown of skin Venous insufficiency (chronic) (peripheral) Plan  Wound Cleansing: Wound #4 Left,Lateral Lower Leg: May shower with protection. Wound #5 Left,Posterior Lower Leg: May shower with protection. Skin Barriers/Peri-Wound Care: Wound #4 Left,Lateral Lower Leg: Moisturizing lotion Triamcinolone Acetonide Ointment (TCA) Moisturizing lotion Triamcinolone Acetonide Ointment (TCA) Wound #5 Left,Posterior Lower Leg: Moisturizing lotion Triamcinolone Acetonide Ointment (TCA) Moisturizing lotion Triamcinolone Acetonide Ointment (TCA) Primary Wound Dressing: Wound #4 Left,Lateral Lower Leg: Silver Alginate Wound #5 Left,Posterior Lower Leg: Silver Alginate Secondary Dressing: Wound #4 Left,Lateral Lower Leg: ABD pad Wound #5 Left,Posterior Lower Leg: ABD pad Dressing Change Frequency: Wound #4 Left,Lateral Lower Leg: Change dressing every week Wound #5 Left,Posterior Lower Leg: Change dressing every week Follow-up Appointments: Wound #4 Left,Lateral Lower Leg: Return Appointment in 1 week. Wound #5 Left,Posterior Lower Leg: Return Appointment in 1 week. Edema Control: Wound #4 Left,Lateral Lower Leg: 3 Layer Compression System - Left Lower Extremity Elevate legs to the level of the heart and pump ankles as often as possible Wound #5 Left,Posterior Lower Leg: Landino, Aariyah ([id number]) 3 Layer Compression System - Left Lower Extremity Elevate legs to the level of the heart and pump ankles as often as possible #1 I have added TCA to the back of her leg 2.  Suspect a component of stasis dermatitis 3. No current evidence of infection the area was not tender 4. Silver alginate as the primary dressings to continue/ABDs/3 layer compression Electronic Signature(s) Signed: 11/08/2019 4:48:02 PM By: Linton Ham MD Entered By: Linton Ham on 11/08/2019 16:42:36 Posten, [name] ([id number]) -------------------------------------------------------------------------------- SuperBill Details Patient Name: [name] Date of Service: 11/08/2019 Medical Record Number: [id number] Patient Account Number: [id number] Date of Birth/Sex: 07/14/1965 (54 y.o. F) Treating RN: Cornell Barman Primary Care Provider: Alean Rinne Other Clinician: Referring Provider: Alean Rinne Treating Provider/Extender: Tito Dine in Treatment: 1 Diagnosis Coding ICD-10 Codes Code Description [phone] Non-pressure chronic ulcer of left calf limited to breakdown of skin I87.2 Venous insufficiency (chronic) (peripheral) Facility Procedures CPT4 Code: [id number] Description: (Facility Use Only) [phone] - [facility] WAQLRJ LWR LT LEG Modifier: Quantity: 1 Physician Procedures CPT4 Code: [id number] Description: 94707 - WC PHYS LEVEL 3 - EST PT Modifier: Quantity: 1 CPT4 Code: Description: ICD-10 Diagnosis Description L97.221 Non-pressure chronic ulcer of left calf limited to breakdown of skin I87.2 Venous insufficiency (chronic) (peripheral) Modifier: Quantity: Electronic Signature(s) Signed: 11/08/2019 6:16:25 PM By: Gretta Cool, BSN, RN, CWS, Kim RN, BSN Signed: 11/09/2019 7:38:30 AM By: Linton Ham MD Previous Signature: 11/08/2019 4:48:02 PM Version By: Linton Ham MD Entered By: Gretta Cool, BSN, RN, CWS, Kim on 11/08/2019 18:16:25

## 2019-11-15 ENCOUNTER — Ambulatory Visit: Payer: Medicare Other | Admitting: Internal Medicine

## 2019-11-17 ENCOUNTER — Other Ambulatory Visit: Payer: Self-pay

## 2019-11-17 ENCOUNTER — Encounter: Payer: Medicare Other | Admitting: Physician Assistant

## 2019-11-17 DIAGNOSIS — L97221 Non-pressure chronic ulcer of left calf limited to breakdown of skin: Secondary | ICD-10-CM | POA: Diagnosis not present

## 2019-11-17 NOTE — Progress Notes (Addendum)
[name] ([id number]) Visit Report for 11/17/2019 Chief Complaint Document Details Patient Name: [name] Date of Service: 11/17/2019 12:30 PM Medical Record Number: [id number] Patient Account Number: [id number] Date of Birth/Sex: 1965/08/08 (54 y.o. F) Treating RN: Cornell Barman Primary Care Provider: Alean Rinne Other Clinician: Referring Provider: Alean Rinne Treating Provider/Extender: Melburn Hake, Joeli Fenner Weeks in Treatment: 2 Information Obtained from: Patient Chief Complaint Left LE Ulcers Electronic Signature(s) Signed: 11/17/2019 12:36:02 PM By: Worthy Keeler PA-C Entered By: Worthy Keeler on 11/17/2019 12:36:02 [name], [name] ([id number]) -------------------------------------------------------------------------------- HPI Details Patient Name: [name] Date of Service: 11/17/2019 12:30 PM Medical Record Number: [id number] Patient Account Number: [id number] Date of Birth/Sex: [birth year] (54 y.o. F) Treating RN: Cornell Barman Primary Care Provider: Alean Rinne Other Clinician: Referring Provider: Alean Rinne Treating Provider/Extender: Melburn Hake, Kunta Hilleary Weeks in Treatment: 2 History of Present Illness HPI Description: 10/24/2018 on evaluation today patient presents for initial evaluation or clinic concerning issues that she has been having with lymphedema for quite some time. Unfortunately she has several wound openings at this point that secondary to her lymphedema/venous stasis are giving her trouble and leaking quite severely. She also has diabetes along with hypertension and stage III chronic kidney disease. Fortunately there is no signs of active infection at this time. She is going to likely require debridement of the left leg ulcer upon evaluation today just based on what I am seeing. Fortunately there is no wound opening on the right. Overall the patient seems to be doing quite well and again there is no evidence of systemic infection which is good news. No  fevers, chills, nausea, vomiting, or diarrhea. 10/31/2018 on evaluation today patient actually appears to be doing excellent in regard to her lower extremity ulcers. She has been tolerating the dressing changes without complication. Fortunately there is no signs of active infection at this time. She has tolerated 3 layer compression wrap without complication. 11/07/2018 upon evaluation today patient actually appears to be doing very well with regard to her left lower extremity ulcers. She has been tolerating the dressing changes without complication. Fortunately there is no signs of active infection. No fevers, chills, nausea, vomiting, or diarrhea. 11/21/2018 upon evaluation today patient appears to be doing quite well with regard to her lower extremity ulcers. In fact both areas seem to be showing signs of good improvement which is excellent. She is not having as much pain as she has in the past and again has a lot of healing compared to previous visits as well. 12/05/2018 on evaluation today patient presents for follow-up concerning her ongoing issues with her bilateral lower extremity ulcers. The good news is her right lower extremity is showing signs of completely healing at this time which is great news. Fortunately there is no evidence of active infection. On the left she has just a very small area still remaining that is open at this time all in all she is very close to complete closure in my opinion. She does have compression stockings to wear at home. 12/12/2018 on evaluation today patient appears to be doing excellent in regard to her left lateral lower extremity ulcer. She has been tolerating the dressing changes without complication. Fortunately there is no signs of active infection at this time. In fact this appears to be pretty much healed at this point although again she is not 100% today. No fevers, chills, nausea, vomiting, or diarrhea. 12/19/2018 on evaluation today patient actually  appears to be doing excellent with regard to her lower  extremity ulcer in fact this appears to be completely healed today which is all some. She has done extremely well with wound care measures. No fevers, chills, nausea, vomiting, or diarrhea. ------------------------------------ 11/01/19-Readmission to the clinic Patient presents with left leg pain, wounds posterior calf, onset about 4 weeks, denies any fevers chills or shakes, has not been using anything to these wounds, was given compression stockings at last discharge from the clinic but has not been able to use them as they rolled down and cause creases Patient's history significant for type 2 diabetes insulin requiring, A1c of 6.9 lately, hypertension, chronic pain 8/18; patient readmitted that the clinic last week. She has wounds on her left lateral posterior lower leg all of this in close juxtaposition i.e. a localized site. We've been using silver alginate under 3 layer compression apparently the wound surface area is better. She was wearing compression stockings from elastic therapy but says they were falling down. As she progresses more towards healing will need to address what we use in terms of compression stockings perhaps external compression garments 11/17/2019 on evaluation today patient appears to be doing well with regard to her legs currently. She does have 2 wounds which are actually measuring much better the more posterior is actually doing very well which I am pleased with the other though smaller is not quite a small but nonetheless on the lateral aspect does seem to be improving. Electronic Signature(s) Signed: 11/17/2019 1:37:07 PM By: Worthy Keeler PA-C Entered By: Worthy Keeler on 11/17/2019 13:37:07 [name], [name] ([id number]) -------------------------------------------------------------------------------- Physical Exam Details Patient Name: [name] Date of Service: 11/17/2019 12:30 PM Medical Record  Number: [id number] Patient Account Number: [id number] Date of Birth/Sex: [birth year] (54 y.o. F) Treating RN: Cornell Barman Primary Care Provider: Alean Rinne Other Clinician: Referring Provider: Alean Rinne Treating Provider/Extender: Melburn Hake, Anderia Lorenzo Weeks in Treatment: 2 Constitutional Well-nourished and well-hydrated in no acute distress. Respiratory normal breathing without difficulty. Psychiatric this patient is able to make decisions and demonstrates good insight into disease process. Alert and Oriented x 3. pleasant and cooperative. Notes Upon inspection patient's wounds currently showed signs of good granulation and epithelization there does not appear to be any signs of infection at this time. Overall I feel like she is progressing into the correct direction. Electronic Signature(s) Signed: 11/17/2019 1:37:29 PM By: Worthy Keeler PA-C Entered By: Worthy Keeler on 11/17/2019 13:37:28 Grigoryan, [name] ([id number]) -------------------------------------------------------------------------------- Physician Orders Details Patient Name: [name] Date of Service: 11/17/2019 12:30 PM Medical Record Number: [id number] Patient Account Number: [id number] Date of Birth/Sex: 02-13-1966 (54 y.o. 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 Wound #5 Left,Posterior Lower Leg o May shower with protection. Skin Barriers/Peri-Wound Care Wound #4 Left,Lateral Lower Leg o Moisturizing lotion o Moisturizing lotion o Triamcinolone Acetonide Ointment (TCA) o  Triamcinolone Acetonide Ointment (TCA) Wound #5 Left,Posterior Lower Leg o Moisturizing lotion o Moisturizing lotion o Triamcinolone Acetonide Ointment (TCA) o Triamcinolone Acetonide Ointment (TCA) Primary Wound Dressing Wound #4 Left,Lateral Lower Leg o Silver Alginate Wound #5 Left,Posterior Lower Leg o Silver Alginate Secondary Dressing Wound #4 Left,Lateral Lower Leg o ABD pad Wound #5 Left,Posterior Lower Leg o ABD  pad Dressing Change Frequency Wound #4 Left,Lateral Lower Leg o Change dressing every week Wound #5 Left,Posterior Lower Leg o Change dressing every week Follow-up Appointments Wound #4 Left,Lateral Lower Leg o Return Appointment in 1 week. Wound #5 Left,Posterior Lower Leg o Return Appointment in 1 week. Trettin, Cailee ([id number]) Edema Control Wound #4 Left,Lateral Lower Leg o 3 Layer Compression System - Left Lower Extremity o Elevate legs to the level of the heart and pump ankles as often as possible Wound #5 Left,Posterior Lower Leg o 3 Layer Compression System - Left Lower Extremity o Elevate legs to the level of the heart and pump ankles as often as possible Electronic Signature(s) Signed: 11/17/2019 4:25:26 PM By: Grover Canavan Signed: 11/17/2019 4:47:28 PM By: Worthy Keeler PA-C Entered By: Grover Canavan on 11/17/2019 13:26:24 Sonn, [name] ([id number]) -------------------------------------------------------------------------------- Problem List Details Patient Name: [name] Date of Service: 11/17/2019 12:30 PM Medical Record Number: [id number] Patient Account Number: [id number] Date of Birth/Sex: [birth year] (54 y.o. F) Treating RN: Cornell Barman Primary Care Provider: Alean Rinne Other Clinician: Referring Provider: Alean Rinne Treating Provider/Extender: Melburn Hake, Alexzavier Girardin Weeks in Treatment: 2 Active Problems ICD-10 Encounter Code Description Active Date MDM Diagnosis L97.221 Non-pressure  chronic ulcer of left calf limited to breakdown of skin 11/01/2019 No Yes I87.2 Venous insufficiency (chronic) (peripheral) 11/01/2019 No Yes Inactive Problems Resolved Problems Electronic Signature(s) Signed: 11/17/2019 12:35:54 PM By: Worthy Keeler PA-C Entered By: Worthy Keeler on 11/17/2019 12:35:54 Knickerbocker, Alee ([id number]) -------------------------------------------------------------------------------- Progress Note Details Patient Name: [name] Date of Service: 11/17/2019 12:30 PM Medical Record Number: [id number] Patient Account Number: [id number] Date of Birth/Sex: Aug 16, 1965 (54 y.o. F) Treating RN: Cornell Barman Primary Care Provider: Alean Rinne Other Clinician: Referring Provider: Alean Rinne Treating Provider/Extender: Melburn Hake, Corri Delapaz Weeks in Treatment: 2 Subjective Chief Complaint Information obtained from Patient Left LE Ulcers History of Present Illness (HPI) 10/24/2018 on evaluation today patient presents for initial evaluation or clinic concerning issues that she has been having with lymphedema for quite some time. Unfortunately she has several wound openings at this point that secondary to her lymphedema/venous stasis are giving her trouble and leaking quite severely. She also has diabetes along with hypertension and stage III chronic kidney disease. Fortunately there is no signs of active infection at this time. She is going to likely require debridement of the left leg ulcer upon evaluation today just based on what I am seeing. Fortunately there is no wound opening on the right. Overall the patient seems to be doing quite well and again there is no evidence of systemic infection which is good news. No fevers, chills, nausea, vomiting, or diarrhea. 10/31/2018 on evaluation today patient actually appears to be doing excellent in regard to her lower extremity ulcers. She has been tolerating the dressing changes without complication. Fortunately there is no signs  of active infection at this time. She has tolerated 3 layer compression wrap without complication. 11/07/2018 upon evaluation today patient actually appears to be doing very well with regard to her left lower extremity ulcers. She has been tolerating the dressing changes without complication. Fortunately there is no signs of active infection. No fevers, chills, nausea, vomiting, or diarrhea. 11/21/2018 upon evaluation today patient appears to be doing quite well with regard to her lower extremity ulcers. In fact both areas seem to be showing signs of good improvement which is excellent. She is not having as much pain as she has in the past and again has a lot of healing compared to  previous visits as well. 12/05/2018 on evaluation today patient presents for follow-up concerning her ongoing issues with her bilateral lower extremity ulcers. The good news is her right lower extremity is showing signs of completely healing at this time which is great news. Fortunately there is no evidence of active infection. On the left she has just a very small area still remaining that is open at this time all in all she is very close to complete closure in my opinion. She does have compression stockings to wear at home. 12/12/2018 on evaluation today patient appears to be doing excellent in regard to her left lateral lower extremity ulcer. She has been tolerating the dressing changes without complication. Fortunately there is no signs of active infection at this time. In fact this appears to be pretty much healed at this point although again she is not 100% today. No fevers, chills, nausea, vomiting, or diarrhea. 12/19/2018 on evaluation today patient actually appears to be doing excellent with regard to her lower extremity ulcer in fact this appears to be completely healed today which is all some. She has done extremely well with wound care measures. No fevers, chills, nausea, vomiting,  or diarrhea. ------------------------------------ 11/01/19-Readmission to the clinic Patient presents with left leg pain, wounds posterior calf, onset about 4 weeks, denies any fevers chills or shakes, has not been using anything to these wounds, was given compression stockings at last discharge from the clinic but has not been able to use them as they rolled down and cause creases Patient's history significant for type 2 diabetes insulin requiring, A1c of 6.9 lately, hypertension, chronic pain 8/18; patient readmitted that the clinic last week. She has wounds on her left lateral posterior lower leg all of this in close juxtaposition i.e. a localized site. We've been using silver alginate under 3 layer compression apparently the wound surface area is better. She was wearing compression stockings from elastic therapy but says they were falling down. As she progresses more towards healing will need to address what we use in terms of compression stockings perhaps external compression garments 11/17/2019 on evaluation today patient appears to be doing well with regard to her legs currently. She does have 2 wounds which are actually measuring much better the more posterior is actually doing very well which I am pleased with the other though smaller is not quite a small but nonetheless on the lateral aspect does seem to be improving. Objective Constitutional Ketterman, Geroldine ([id number]) Well-nourished and well-hydrated in no acute distress. Vitals Time Taken: 12:45 PM, Height: 67 in, Weight: 317 lbs, BMI: 49.6, Temperature: 98.9 F, Pulse: 86 bpm, Respiratory Rate: 20 breaths/min, Blood Pressure: 138/46 mmHg. Respiratory normal breathing without difficulty. Psychiatric this patient is able to make decisions and demonstrates good insight into disease process. Alert and Oriented x 3. pleasant and cooperative. General Notes: Upon inspection patient's wounds currently showed signs of good granulation  and epithelization there does not appear to be any signs of infection at this time. Overall I feel like she is progressing into the correct direction. Integumentary (Hair, Skin) Wound #4 status is Open. Original cause of wound was Gradually Appeared. The wound is located on the Left,Lateral Lower Leg. The wound measures 1cm length x 0.5cm width x 0.1cm depth; 0.393cm^2 area and 0.039cm^3 volume. There is Fat Layer (Subcutaneous Tissue) exposed. There is a medium amount of serosanguineous drainage noted. The wound margin is flat and intact. There is small (1-33%) red granulation within the wound bed. There is a small (  1-33%) amount of necrotic tissue within the wound bed including Adherent Slough. Wound #5 status is Open. Original cause of wound was Gradually Appeared. The wound is located on the Left,Posterior Lower Leg. The wound measures 0.8cm length x 0.5cm width x 0.1cm depth; 0.314cm^2 area and 0.031cm^3 volume. There is Fat Layer (Subcutaneous Tissue) exposed. There is a medium amount of serosanguineous drainage noted. The wound margin is flat and intact. There is small (1-33%) red granulation within the wound bed. There is a medium (34-66%) amount of necrotic tissue within the wound bed including Adherent Slough. Assessment Active Problems ICD-10 Non-pressure chronic ulcer of left calf limited to breakdown of skin Venous insufficiency (chronic) (peripheral) Procedures Wound #4 Pre-procedure diagnosis of Wound #4 is a Venous Leg Ulcer located on the Left,Lateral Lower Leg . There was a Three Layer Compression Therapy Procedure with a pre-treatment ABI of 1 by Grover Canavan, RN. Post procedure Diagnosis Wound #4: Same as Pre-Procedure Wound #5 Pre-procedure diagnosis of Wound #5 is a Venous Leg Ulcer located on the Left,Posterior Lower Leg . There was a Three Layer Compression Therapy Procedure with a pre-treatment ABI of 1 by Grover Canavan, RN. Post procedure Diagnosis Wound #5:  Same as Pre-Procedure Plan Wound Cleansing: Wound #4 Left,Lateral Lower Leg: May shower with protection. Wound #5 Left,Posterior Lower Leg: May shower with protection. Skin Barriers/Peri-Wound Care: Wound #4 Left,Lateral Lower Leg: Moisturizing lotion Moisturizing lotion Triamcinolone Acetonide Ointment (TCA) Triamcinolone Acetonide Ointment (TCA) Wound #5 Left,Posterior Lower Leg: Ontko, [place] ([id number]) Moisturizing lotion Moisturizing lotion Triamcinolone Acetonide Ointment (TCA) Triamcinolone Acetonide Ointment (TCA) Primary Wound Dressing: Wound #4 Left,Lateral Lower Leg: Silver Alginate Wound #5 Left,Posterior Lower Leg: Silver Alginate Secondary Dressing: Wound #4 Left,Lateral Lower Leg: ABD pad Wound #5 Left,Posterior Lower Leg: ABD pad Dressing Change Frequency: Wound #4 Left,Lateral Lower Leg: Change dressing every week Wound #5 Left,Posterior Lower Leg: Change dressing every week Follow-up Appointments: Wound #4 Left,Lateral Lower Leg: Return Appointment in 1 week. Wound #5 Left,Posterior Lower Leg: Return Appointment in 1 week. Edema Control: Wound #4 Left,Lateral Lower Leg: 3 Layer Compression System - Left Lower Extremity Elevate legs to the level of the heart and pump ankles as often as possible Wound #5 Left,Posterior Lower Leg: 3 Layer Compression System - Left Lower Extremity Elevate legs to the level of the heart and pump ankles as often as possible 1. I would recommend currently that we go ahead and continue with the wound care measures as before specifically with regard to the silver alginate dressing I think this is doing a great job. 2. I am also can recommend the patient continue to monitor for any signs of worsening as far as symptoms that would indicate infection obviously Ace wrap she would not be able to see this but nonetheless she can still tell how things feel. 3. I am also can recommend that the patient continue with a 3 layer  compression wrap to try to keep the edema under good control I think that has done well for her up to this point. We will see patient back for reevaluation in 1 week here in the clinic. If anything worsens or changes patient will contact our office for additional recommendations. Electronic Signature(s) Signed: 11/17/2019 1:38:18 PM By: Worthy Keeler PA-C Entered By: Worthy Keeler on 11/17/2019 13:38:17 Delpino, [name] ([id number]) -------------------------------------------------------------------------------- SuperBill Details Patient Name: [name] Date of Service: 11/17/2019 Medical Record Number: [id number] Patient Account Number: [id number] Date of Birth/Sex: Sep 07, 1965 (54 y.o. 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

## 2019-11-17 NOTE — Progress Notes (Addendum)
[name], [name] ([id number]) Visit Report for 11/17/2019 Arrival Information Details Patient Name: [name], [name] Date of Service: 11/17/2019 12:30 PM Medical Record Number: [id number] Patient Account Number: [id number] Date of Birth/Sex: [birth year] (54 y.o. F) Treating RN: Cornell Barman Primary Care Marcele Kosta: Alean Rinne Other Clinician: Referring Caprice Wasko: Alean Rinne Treating Jasmon Graffam/Extender: Melburn Hake, HOYT Weeks in Treatment: 2 Visit Information History Since Last Visit All ordered tests and consults were completed: No Patient Arrived: Ambulatory Added or deleted any medications: No Arrival Time: 13:01 Any new allergies or adverse reactions: No Accompanied By: self Had a fall or experienced change in No Transfer Assistance: None activities of daily living that may affect Patient Identification Verified: Yes risk of falls: Secondary Verification Process Completed: Yes Signs or symptoms of abuse/neglect since last visito No Patient Requires Transmission-Based Precautions: No Hospitalized since last visit: No Patient Has Alerts: No Implantable device outside of the clinic excluding No cellular tissue based products placed in the center since last visit: Has Dressing in Place as Prescribed: Yes Has Compression in Place as Prescribed: Yes Pain Present Now: No Electronic Signature(s) Signed: 11/17/2019 2:29:58 PM By: Darci Needle Entered By: Darci Needle on 11/17/2019 13:01:58 [name], [name] ([id number]) -------------------------------------------------------------------------------- Clinic Level of Care Assessment Details Patient Name: [name] Date of Service: 11/17/2019 12:30 PM Medical Record Number: [id number] Patient Account Number: [id number] Date of Birth/Sex: 1965-07-26 (54 y.o. F) Treating RN: Grover Canavan Primary Care Sasha Rogel: Alean Rinne Other Clinician: Referring Maire Govan: Alean Rinne Treating Kiyo Heal/Extender: Melburn Hake, HOYT Weeks in  Treatment: 2 Clinic Level of Care Assessment Items TOOL 4 Quantity Score []  - Use when only an EandM is performed on FOLLOW-UP visit 0 ASSESSMENTS - Nursing Assessment / Reassessment X - Reassessment of Co-morbidities (includes updates in patient status) 1 10 X- 1 5 Reassessment of Adherence to Treatment Plan ASSESSMENTS - Wound and Skin Assessment / Reassessment []  - Simple Wound Assessment / Reassessment - one wound 0 X- 2 5 Complex Wound Assessment / Reassessment - multiple wounds []  - 0 Dermatologic / Skin Assessment (not related to wound area) ASSESSMENTS - Focused Assessment X - Circumferential Edema Measurements - multi extremities 2 5 []  - 0 Nutritional Assessment / Counseling / Intervention X- 1 5 Lower Extremity Assessment (monofilament, tuning fork, pulses) []  - 0 Peripheral Arterial Disease Assessment (using hand held doppler) ASSESSMENTS - Ostomy and/or Continence Assessment and Care []  - Incontinence Assessment and Management 0 []  - 0 Ostomy Care Assessment and Management (repouching, etc.) PROCESS - Coordination of Care X - Simple Patient / Family Education for ongoing care 1 15 []  - 0 Complex (extensive) Patient / Family Education for ongoing care []  - 0 Staff obtains Programmer, systems, Records, Test Results / Process Orders []  - 0 Staff telephones HHA, Nursing Homes / Clarify orders / etc []  - 0 Routine Transfer to another Facility (non-emergent condition) []  - 0 Routine Hospital Admission (non-emergent condition) []  - 0 New Admissions / Biomedical engineer / Ordering NPWT, Apligraf, etc. []  - 0 Emergency Hospital Admission (emergent condition) X- 1 10 Simple Discharge Coordination []  - 0 Complex (extensive) Discharge Coordination PROCESS - Special Needs []  - Pediatric / Minor Patient Management 0 []  - 0 Isolation Patient Management []  - 0 Hearing / Language / Visual special needs []  - 0 Assessment of Community assistance (transportation, D/C  planning, etc.) []  - 0 Additional assistance / Altered mentation []  - 0 Support Surface(s) Assessment (bed, cushion, seat, etc.) INTERVENTIONS - Wound Cleansing / Measurement Miller, Kairah ([id number]) []  - 0 Simple Wound  Cleansing - one wound X- 2 5 Complex Wound Cleansing - multiple wounds []  - 0 Wound Imaging (photographs - any number of wounds) []  - 0 Wound Tracing (instead of photographs) []  - 0 Simple Wound Measurement - one wound X- 2 5 Complex Wound Measurement - multiple wounds INTERVENTIONS - Wound Dressings X - Small Wound Dressing one or multiple wounds 2 10 []  - 0 Medium Wound Dressing one or multiple wounds []  - 0 Large Wound Dressing one or multiple wounds []  - 0 Application of Medications - topical []  - 0 Application of Medications - injection INTERVENTIONS - Miscellaneous []  - External ear exam 0 []  - 0 Specimen Collection (cultures, biopsies, blood, body fluids, etc.) []  - 0 Specimen(s) / Culture(s) sent or taken to Lab for analysis []  - 0 Patient Transfer (multiple staff / Civil Service fast streamer / Similar devices) []  - 0 Simple Staple / Suture removal (25 or less) []  - 0 Complex Staple / Suture removal (26 or more) []  - 0 Hypo / Hyperglycemic Management (close monitor of Blood Glucose) []  - 0 Ankle / Brachial Index (ABI) - do not check if billed separately X- 1 5 Vital Signs Has the patient been seen at the hospital within the last three years: Yes Total Score: 110 Level Of Care: New/Established - Level 3 Electronic Signature(s) Signed: 11/17/2019 4:25:26 PM By: Grover Canavan Entered By: Grover Canavan on 11/17/2019 13:27:59 [name], [name] ([id number]) -------------------------------------------------------------------------------- Compression Therapy Details Patient Name: [name] Date of Service: 11/17/2019 12:30 PM Medical Record Number: [id number] Patient Account Number: [id number] Date of Birth/Sex: [birth year] (54 y.o. F) Treating RN:  Grover Canavan Primary Care Khamari Sheehan: Alean Rinne Other Clinician: Referring Harla Mensch: Alean Rinne Treating Vere Diantonio/Extender: Melburn Hake, HOYT Weeks in Treatment: 2 Compression Therapy Performed for Wound Assessment: Wound #4 Left,Lateral Lower Leg Performed By: Clinician Grover Canavan, RN Compression Type: Three Layer Pre Treatment ABI: 1 Post Procedure Diagnosis Same as Pre-procedure Electronic Signature(s) Signed: 11/17/2019 4:25:26 PM By: Grover Canavan Entered By: Grover Canavan on 11/17/2019 13:24:20 Kreitz, [name] ([id number]) -------------------------------------------------------------------------------- Compression Therapy Details Patient Name: [name] Date of Service: 11/17/2019 12:30 PM Medical Record Number: [id number] Patient Account Number: [id number] Date of Birth/Sex: May 29, 1965 (54 y.o. F) Treating RN: Grover Canavan Primary Care Ryelan Kazee: Alean Rinne Other Clinician: Referring Hadlie Gipson: Alean Rinne Treating Aydian Dimmick/Extender: Melburn Hake, HOYT Weeks in Treatment: 2 Compression Therapy Performed for Wound Assessment: Wound #5 Left,Posterior Lower Leg Performed By: Clinician Grover Canavan, RN Compression Type: Three Layer Pre Treatment ABI: 1 Post Procedure Diagnosis Same as Pre-procedure Electronic Signature(s) Signed: 11/17/2019 4:25:26 PM By: Grover Canavan Entered By: Grover Canavan on 11/17/2019 13:24:21 Shuford, [name] ([id number]) -------------------------------------------------------------------------------- Encounter Discharge Information Details Patient Name: [name] Date of Service: 11/17/2019 12:30 PM Medical Record Number: [id number] Patient Account Number: [id number] Date of Birth/Sex: 1965-12-14 (54 y.o. F) Treating RN: Grover Canavan Primary Care Jordane Hisle: Alean Rinne Other Clinician: Referring Merwyn Hodapp: Alean Rinne Treating Eashan Schipani/Extender: Sharalyn Ink in Treatment: 2 Encounter Discharge  Information Items Discharge Condition: Stable Ambulatory Status: Ambulatory Discharge Destination: Home Transportation: Private Auto Accompanied By: self Schedule Follow-up Appointment: Yes Clinical Summary of Care: Electronic Signature(s) Signed: 11/17/2019 4:25:26 PM By: Grover Canavan Entered By: Grover Canavan on 11/17/2019 13:23:24 [name], [name] ([id number]) -------------------------------------------------------------------------------- Lower Extremity Assessment Details Patient Name: [name] Date of Service: 11/17/2019 12:30 PM Medical Record Number: [id number] Patient Account Number: [id number] Date of Birth/Sex: [birth year] (54 y.o. F) Treating RN: Cornell Barman Primary Care Eaton Folmar: Alean Rinne Other Clinician: Referring Antwuan Eckley: Alean Rinne Treating Triniti Gruetzmacher/Extender: Joaquim Lai  III, HOYT Weeks in Treatment: 2 Edema Assessment Assessed: [Left: Yes] [Right: Yes] Edema: [Left: Yes] [Right: Yes] Calf Left: Right: Point of Measurement: 33 cm From Medial Instep 44 cm 42 cm Ankle Left: Right: Point of Measurement: 10 cm From Medial Instep 28 cm 26.5 cm Vascular Assessment Pulses: Dorsalis Pedis Palpable: [Left:Yes] [Right:Yes] Posterior Tibial Palpable: [Left:Yes] [Right:Yes] Electronic Signature(s) Signed: 11/17/2019 2:29:58 PM By: Darci Needle Signed: 11/17/2019 4:37:20 PM By: Gretta Cool, BSN, RN, CWS, Kim RN, BSN Entered By: Darci Needle on 11/17/2019 13:08:42 [name], [name] ([id number]) -------------------------------------------------------------------------------- Multi Wound Chart Details Patient Name: [name] Date of Service: 11/17/2019 12:30 PM Medical Record Number: [id number] Patient Account Number: [id number] Date of Birth/Sex: Mar 10, 1966 (54 y.o. 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 N/A Extremity Extremity Comorbid History: Congestive Heart Failure, Coronary Congestive Heart Failure, Coronary N/A Artery Disease, Hypertension, Artery Disease, Hypertension, Peripheral Venous Disease, Type II Peripheral Venous Disease, Type II Diabetes, End Stage Renal Disease, Diabetes, End Stage Renal Disease, Neuropathy Neuropathy Date Acquired: 09/16/2019 09/16/2019 N/A Weeks of Treatment: 2 2 N/A Wound Status: Open Open N/A Measurements L x W x D (cm) 1x0.5x0.1 0.8x0.5x0.1 N/A Area (cm) : 0.393 0.314 N/A Volume (cm) : 0.039 0.031 N/A % Reduction in Area: 58.30% 77.80% N/A % Reduction in Volume: 58.50% 78.00% N/A Classification: Partial Thickness Partial Thickness N/A Exudate Amount: Medium Medium N/A Exudate Type: Serosanguineous Serosanguineous N/A Exudate Color: red, brown red, brown N/A Wound Margin: Flat and Intact Flat and Intact N/A Granulation Amount: Small (1-33%) Small (1-33%) N/A Granulation Quality: Red Red N/A Necrotic Amount: Small (1-33%) Medium (34-66%) N/A Exposed Structures: Fat Layer (Subcutaneous Tissue): Fat Layer (Subcutaneous Tissue): N/A Yes Yes Fascia: No Fascia: No Tendon: No Tendon: No Muscle: No Muscle: No Joint: No Joint: No Bone: No Bone: No Epithelialization: None N/A N/A Treatment Notes Electronic Signature(s) Signed: 11/17/2019 4:25:26 PM By: Grover Canavan Entered By: Grover Canavan on 11/17/2019 13:22:33 [name], [name] ([id number]) [name], [name]  ([id number]) -------------------------------------------------------------------------------- Multi-Disciplinary Care Plan Details Patient Name: [name] Date of Service: 11/17/2019 12:30 PM Medical Record Number: [id number] Patient Account Number: [id number] Date of Birth/Sex: 17-May-1965 (54 y.o. F) Treating RN: Grover Canavan Primary Care Kyrin Gratz: Alean Rinne Other Clinician: Referring Tashima Scarpulla: Alean Rinne Treating Kristinia Leavy/Extender: Melburn Hake, HOYT Weeks in Treatment: 2 Active Inactive Orientation to the Wound Care Program Nursing Diagnoses: Knowledge deficit related to the wound healing center program Goals: Patient/caregiver will verbalize understanding of the [facility] Program Date Initiated: 11/08/2019 Target Resolution Date: 11/08/2019 Goal Status: Active Interventions: Provide education on orientation to the wound center Notes: Venous Leg Ulcer Nursing Diagnoses: Knowledge deficit related to disease process and management Goals: Patient will maintain optimal edema control Date Initiated: 11/08/2019 Target Resolution Date: 11/15/2019 Goal Status: Active Interventions: Assess peripheral edema status every visit. Treatment Activities: Therapeutic compression applied : 11/08/2019 Notes: Wound/Skin Impairment Nursing Diagnoses: Impaired tissue integrity Goals: Patient/caregiver will verbalize understanding of skin care regimen Date Initiated: 11/08/2019 Target Resolution Date: 11/15/2019 Goal Status: Active Interventions: Assess ulceration(s) every visit Treatment Activities: Skin care regimen  initiated : 11/08/2019 Topical wound management initiated : 11/08/2019 Notes: Electronic Signature(s) Signed: 11/17/2019 4:25:26 PM By: Dalbert Batman, Kendyll ([id number]) Entered By: Grover Canavan on 11/17/2019 13:22:24 [name], [name] ([id number]) -------------------------------------------------------------------------------- Pain Assessment  Details Patient Name: [name] Date of Service: 11/17/2019 12:30 PM Medical Record Number: [id number] Patient Account Number: [id number] Date of Birth/Sex: Jan 06, 1966 (54 y.o. F) Treating RN: Cornell Barman Primary Care Ariel Wingrove: Alean Rinne Other Clinician: Referring Alphons Burgert: Alean Rinne Treating Iyania Denne/Extender: Melburn Hake, HOYT Weeks in Treatment: 2 Active Problems Location of Pain Severity and Description of Pain Patient Has Paino No Site Locations With Dressing Change: No Pain Management and Medication Current Pain Management: Electronic Signature(s) Signed: 11/17/2019 2:29:58 PM By: Darci Needle Signed: 11/17/2019 4:37:20 PM By: Gretta Cool, BSN, RN, CWS, Kim RN, BSN Entered By: Darci Needle on 11/17/2019 13:02:48 [name], [name] ([id number]) -------------------------------------------------------------------------------- Patient/Caregiver Education Details Patient Name: [name] Date of Service: 11/17/2019 12:30 PM Medical Record Number: [id number] Patient Account Number: [id number] Date of Birth/Gender: 1965/08/27 (54 y.o. F) Treating RN: Grover Canavan Primary Care Physician: Alean Rinne Other Clinician: Referring Physician: Alean Rinne Treating Physician/Extender: Sharalyn Ink in Treatment: 2 Education Assessment Education Provided To: Patient Education Topics Provided Wound/Skin Impairment: Handouts: Caring for Your Ulcer Methods: Explain/Verbal Responses: State content correctly Electronic Signature(s) Signed: 11/17/2019 4:25:26 PM By: Grover Canavan Entered By: Grover Canavan on 11/17/2019 13:22:57 [name], [name] ([id number]) -------------------------------------------------------------------------------- Wound Assessment Details Patient Name: [name] Date of Service: 11/17/2019 12:30 PM Medical Record Number: [id number] Patient Account Number: [id number] Date of Birth/Sex: 02/24/1966 (54 y.o. F) Treating RN: Cornell Barman Primary  Care Seeley Hissong: Alean Rinne Other Clinician: Referring Styles Fambro: Alean Rinne Treating Corda Shutt/Extender: Melburn Hake, HOYT Weeks in Treatment: 2 Wound Status Wound Number: 4 Primary Venous Leg Ulcer Etiology: Wound Location: Left, Lateral Lower Leg Secondary Diabetic Wound/Ulcer of the Lower Extremity Wounding Event: Gradually Appeared Etiology: Date Acquired: 09/16/2019 Wound Open Weeks Of Treatment: 2 Status: Clustered Wound: No Comorbid Congestive Heart Failure, Coronary Artery Disease, History: Hypertension, Peripheral Venous Disease, Type II Diabetes, End Stage Renal Disease, Neuropathy Photos Wound Measurements Length: (cm) 1 Width: (cm) 0.5 Depth: (cm) 0.1 Area: (cm) 0.393 Volume: (cm) 0.039 % Reduction in Area: 58.3% % Reduction in Volume: 58.5% Epithelialization: None Wound Description Classification: Partial Thickness Fou Wound Margin: Flat and Intact Slo Exudate Amount: Medium Exudate Type: Serosanguineous Exudate Color: red, brown l Odor After Cleansing: No ugh/Fibrino Yes Wound Bed Granulation Amount: Small (1-33%) Exposed Structure Granulation Quality: Red Fascia Exposed: No Necrotic Amount: Small (1-33%) Fat Layer (Subcutaneous Tissue) Exposed: Yes Necrotic Quality: Adherent Slough Tendon Exposed: No Muscle Exposed: No Joint Exposed: No Bone Exposed: No Treatment Notes Wound #4 (Left, Lateral Lower Leg) Notes scell, abd, 3LL [name], Alaysiah ([id number]) Electronic Signature(s) Signed: 11/17/2019 2:29:58 PM By: Darci Needle Signed: 11/17/2019 4:37:20 PM By: Gretta Cool, BSN, RN, CWS, Kim RN, BSN Entered By: Darci Needle on 11/17/2019 13:07:22 Perren, [name] ([id number]) -------------------------------------------------------------------------------- Wound Assessment Details Patient Name: [name] Date of Service: 11/17/2019 12:30 PM Medical Record Number: [id number] Patient Account Number: [id number] Date of Birth/Sex: 29-May-1965 (54 y.o.  F) Treating RN: Cornell Barman Primary Care Maevyn Riordan: Alean Rinne Other Clinician: Referring Nanetta Wiegman: Alean Rinne Treating Onofre Gains/Extender: Melburn Hake, HOYT Weeks in Treatment: 2 Wound Status Wound Number: 5 Primary Venous Leg Ulcer Etiology: Wound Location: Left, Posterior Lower Leg Secondary Diabetic Wound/Ulcer of the Lower Extremity Wounding Event: Gradually Appeared Etiology: Date Acquired: 09/16/2019 Wound Open Weeks Of Treatment: 2 Status: Clustered Wound: No Comorbid Congestive Heart Failure, Coronary Artery Disease,  History: Hypertension, Peripheral Venous Disease, Type II Diabetes, End Stage Renal Disease, Neuropathy Photos Wound Measurements Length: (cm) 0.8 Width: (cm) 0.5 Depth: (cm) 0.1 Area: (cm) 0.314 Volume: (cm) 0.031 % Reduction in Area: 77.8% % Reduction in Volume: 78% Wound Description Classification: Partial Thickness Fo Wound Margin: Flat and Intact Sl Exudate Amount: Medium Exudate Type: Serosanguineous Exudate Color: red, brown ul Odor After Cleansing: No ough/Fibrino Yes Wound Bed Granulation Amount: Small (1-33%) Exposed Structure Granulation Quality: Red Fascia Exposed: No Necrotic Amount: Medium (34-66%) Fat Layer (Subcutaneous Tissue) Exposed: Yes Necrotic Quality: Adherent Slough Tendon Exposed: No Muscle Exposed: No Joint Exposed: No Bone Exposed: No Treatment Notes Wound #5 (Left, Posterior Lower Leg) Notes scell, abd, 3LL Malan, Lynnette ([id number]) Electronic Signature(s) Signed: 11/17/2019 2:29:58 PM By: Darci Needle Signed: 11/17/2019 4:37:20 PM By: Gretta Cool, BSN, RN, CWS, Kim RN, BSN Entered By: Darci Needle on 11/17/2019 13:07:46 Netzel, [name] ([id number]) -------------------------------------------------------------------------------- [place] Details Patient Name: [name] Date of Service: 11/17/2019 12:30 PM Medical Record Number: [id number] Patient Account Number: [id number] Date of Birth/Sex: 02/04/1966  (54 y.o. F) Treating RN: Cornell Barman Primary Care Quentin Shorey: Alean Rinne Other Clinician: Referring Dewight Catino: Alean Rinne Treating Lucindia Lemley/Extender: Melburn Hake, HOYT Weeks in Treatment: 2 Vital Signs Time Taken: 12:45 Temperature (F): 98.9 Height (in): 67 Pulse (bpm): 86 Weight (lbs): 317 Respiratory Rate (breaths/min): 20 Body Mass Index (BMI): 49.6 Blood Pressure (mmHg): 138/46 Reference Range: 80 - 120 mg / dl Electronic Signature(s) Signed: 11/17/2019 2:29:58 PM By: Darci Needle Entered By: Darci Needle on 11/17/2019 13:02:32

## 2019-11-22 ENCOUNTER — Ambulatory Visit: Payer: Medicare Other | Admitting: Internal Medicine

## 2019-11-23 ENCOUNTER — Encounter: Payer: Medicare Other | Attending: Internal Medicine | Admitting: Physician Assistant

## 2019-11-23 ENCOUNTER — Other Ambulatory Visit: Payer: Self-pay

## 2019-11-23 DIAGNOSIS — L97221 Non-pressure chronic ulcer of left calf limited to breakdown of skin: Secondary | ICD-10-CM | POA: Diagnosis present

## 2019-11-23 DIAGNOSIS — N183 Chronic kidney disease, stage 3 unspecified: Secondary | ICD-10-CM | POA: Diagnosis not present

## 2019-11-23 DIAGNOSIS — I2510 Atherosclerotic heart disease of native coronary artery without angina pectoris: Secondary | ICD-10-CM | POA: Insufficient documentation

## 2019-11-23 DIAGNOSIS — Z794 Long term (current) use of insulin: Secondary | ICD-10-CM | POA: Diagnosis not present

## 2019-11-23 DIAGNOSIS — I872 Venous insufficiency (chronic) (peripheral): Secondary | ICD-10-CM | POA: Insufficient documentation

## 2019-11-23 DIAGNOSIS — I129 Hypertensive chronic kidney disease with stage 1 through stage 4 chronic kidney disease, or unspecified chronic kidney disease: Secondary | ICD-10-CM | POA: Diagnosis not present

## 2019-11-23 DIAGNOSIS — Z951 Presence of aortocoronary bypass graft: Secondary | ICD-10-CM | POA: Insufficient documentation

## 2019-11-23 NOTE — Progress Notes (Addendum)
PURVA, VESSELL ([id number]) Visit Report for 11/23/2019 Chief Complaint Document Details Patient Name: [name], [name] Date of Service: 11/23/2019 2:30 PM Medical Record Number: [id number] Patient Account Number: [id number] Date of Birth/Sex: May 04, 1965 (54 y.o. F) Treating RN: Cornell Barman Primary Care Provider: Alean Rinne Other Clinician: Referring Provider: Alean Rinne Treating Provider/Extender: Melburn Hake, Vondell Babers Weeks in Treatment: 3 Information Obtained from: Patient Chief Complaint Left LE Ulcers Electronic Signature(s) Signed: 11/23/2019 2:46:14 PM By: Worthy Keeler PA-C Entered By: Worthy Keeler on 11/23/2019 14:46:14 Wagley, Lindi ([id number]) -------------------------------------------------------------------------------- HPI Details Patient Name: [name] Date of Service: 11/23/2019 2:30 PM Medical Record Number: [id number] Patient Account Number: [id number] Date of Birth/Sex: 1965-06-11 (54 y.o. F) Treating RN: Cornell Barman Primary Care Provider: Alean Rinne Other Clinician: Referring Provider: Alean Rinne Treating Provider/Extender: Melburn Hake, Nava Song Weeks in Treatment: 3 History of Present Illness HPI Description: 10/24/2018 on evaluation today patient presents for initial evaluation or clinic concerning issues that she has been having with lymphedema for quite some time. Unfortunately she has several wound openings at this point that secondary to her lymphedema/venous stasis are giving her trouble and leaking quite severely. She also has diabetes along with hypertension and stage III chronic kidney disease. Fortunately there is no signs of active infection at this time. She is going to likely require debridement of the left leg ulcer upon evaluation today just based on what I am seeing. Fortunately there is no wound opening on the right. Overall the patient seems to be doing quite well and again there is no evidence of systemic infection which is good news. No fevers,  chills, nausea, vomiting, or diarrhea. 10/31/2018 on evaluation today patient actually appears to be doing excellent in regard to her lower extremity ulcers. She has been tolerating the dressing changes without complication. Fortunately there is no signs of active infection at this time. She has tolerated 3 layer compression wrap without complication. 11/07/2018 upon evaluation today patient actually appears to be doing very well with regard to her left lower extremity ulcers. She has been tolerating the dressing changes without complication. Fortunately there is no signs of active infection. No fevers, chills, nausea, vomiting, or diarrhea. 11/21/2018 upon evaluation today patient appears to be doing quite well with regard to her lower extremity ulcers. In fact both areas seem to be showing signs of good improvement which is excellent. She is not having as much pain as she has in the past and again has a lot of healing compared to previous visits as well. 12/05/2018 on evaluation today patient presents for follow-up concerning her ongoing issues with her bilateral lower extremity ulcers. The good news is her right lower extremity is showing signs of completely healing at this time which is great news. Fortunately there is no evidence of active infection. On the left she has just a very small area still remaining that is open at this time all in all she is very close to complete closure in my opinion. She does have compression stockings to wear at home. 12/12/2018 on evaluation today patient appears to be doing excellent in regard to her left lateral lower extremity ulcer. She has been tolerating the dressing changes without complication. Fortunately there is no signs of active infection at this time. In fact this appears to be pretty much healed at this point although again she is not 100% today. No fevers, chills, nausea, vomiting, or diarrhea. 12/19/2018 on evaluation today patient actually appears to  be doing excellent with regard to her lower  extremity ulcer in fact this appears to be completely healed today which is all some. She has done extremely well with wound care measures. No fevers, chills, nausea, vomiting, or diarrhea. ------------------------------------ 11/01/19-Readmission to the clinic Patient presents with left leg pain, wounds posterior calf, onset about 4 weeks, denies any fevers chills or shakes, has not been using anything to these wounds, was given compression stockings at last discharge from the clinic but has not been able to use them as they rolled down and cause creases Patient's history significant for type 2 diabetes insulin requiring, A1c of 6.9 lately, hypertension, chronic pain 8/18; patient readmitted that the clinic last week. She has wounds on her left lateral posterior lower leg all of this in close juxtaposition i.e. a localized site. We've been using silver alginate under 3 layer compression apparently the wound surface area is better. She was wearing compression stockings from elastic therapy but says they were falling down. As she progresses more towards healing will need to address what we use in terms of compression stockings perhaps external compression garments 11/17/2019 on evaluation today patient appears to be doing well with regard to her legs currently. She does have 2 wounds which are actually measuring much better the more posterior is actually doing very well which I am pleased with the other though smaller is not quite a small but nonetheless on the lateral aspect does seem to be improving. 11/23/2019 on evaluation today patient appears to be doing well in regard to her wounds. In fact the wound on the posterior aspect of her leg appears healed the lateral aspect is still open but extremely small. Electronic Signature(s) Signed: 11/23/2019 3:34:03 PM By: Worthy Keeler PA-C Entered By: Worthy Keeler on 11/23/2019 15:34:03 Bouldin, [name]  ([id number]) -------------------------------------------------------------------------------- Physical Exam Details Patient Name: [name] Date of Service: 11/23/2019 2:30 PM Medical Record Number: [id number] Patient Account Number: [id number] Date of Birth/Sex: February 22, 1966 (54 y.o. F) Treating RN: Cornell Barman Primary Care Provider: Alean Rinne Other Clinician: Referring Provider: Alean Rinne Treating Provider/Extender: Melburn Hake, Sama Arauz Weeks in Treatment: 3 Constitutional Well-nourished and well-hydrated in no acute distress. Respiratory normal breathing without difficulty. Psychiatric this patient is able to make decisions and demonstrates good insight into disease process. Alert and Oriented x 3. pleasant and cooperative. Notes Patient's wound bed currently showed signs of good granulation at this time and excellent epithelization the posterior wound is completely healed the lateral wound does not appear to be doing poorly at all which is great news and overall very pleased with how this is appearing today. This is a very slight and superficial wound currently. Electronic Signature(s) Signed: 11/23/2019 3:34:30 PM By: Worthy Keeler PA-C Entered By: Worthy Keeler on 11/23/2019 15:34:30 Imhoff, [name] ([id number]) -------------------------------------------------------------------------------- Physician Orders Details Patient Name: [name] Date of Service: 11/23/2019 2:30 PM Medical Record Number: [id number] Patient Account Number: [id number] Date of Birth/Sex: 1965/12/10 (54 y.o. 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 Skin Barriers/Peri-Wound Care Wound #4 Left,Lateral Lower Leg o Moisturizing lotion o Triamcinolone Acetonide Ointment (TCA) Primary Wound Dressing Wound #4 Left,Lateral Lower Leg o Silver Alginate Secondary Dressing Wound #4 Left,Lateral Lower Leg   o ABD pad Dressing Change Frequency Wound #4 Left,Lateral Lower Leg o Change dressing every week Follow-up Appointments Wound #4 Left,Lateral Lower Leg o Return Appointment in 1 week. Edema Control Wound #4 Left,Lateral Lower Leg o 3 Layer Compression System - Left Lower Extremity o Elevate legs to the level of the heart and pump ankles as often as possible Electronic Signature(s) Signed: 11/23/2019 4:51:51 PM By: Grover Canavan Signed: 11/23/2019 5:25:12 PM By: Worthy Keeler PA-C Entered By: Grover Canavan on 11/23/2019 15:28:05 Mehlhoff, [name] ([id number]) -------------------------------------------------------------------------------- Problem List Details Patient Name: [name] Date of Service: 11/23/2019 2:30 PM Medical Record Number: [id number] Patient Account Number: [id number] Date of Birth/Sex: [birth year] (54 y.o. F) Treating RN: Cornell Barman Primary Care Provider: Alean Rinne Other Clinician: Referring Provider: Alean Rinne Treating Provider/Extender: Melburn Hake, Lyonel Morejon Weeks in Treatment: 3 Active Problems ICD-10 Encounter Code Description Active Date MDM Diagnosis L97.221 Non-pressure chronic ulcer of left calf limited to breakdown of skin 11/01/2019 No Yes I87.2 Venous insufficiency (chronic) (peripheral) 11/01/2019 No Yes Inactive Problems Resolved Problems Electronic Signature(s) Signed: 11/23/2019 2:46:06 PM By: Worthy Keeler PA-C Entered By: Worthy Keeler on 11/23/2019 14:46:06 Dimarco, Illyria ([id number]) -------------------------------------------------------------------------------- Progress Note Details Patient  Name: [name] Date of Service: 11/23/2019 2:30 PM Medical Record Number: [id number] Patient Account Number: [id number] Date of Birth/Sex: 10-06-1965 (54 y.o. F) Treating RN: Cornell Barman Primary Care Provider: Alean Rinne Other Clinician: Referring Provider: Alean Rinne Treating Provider/Extender: Melburn Hake, Vangie Henthorn Weeks in Treatment: 3 Subjective Chief Complaint Information obtained from Patient Left LE Ulcers History of Present Illness (HPI) 10/24/2018 on evaluation today patient presents for initial evaluation or clinic concerning issues that she has been having with lymphedema for quite some time. Unfortunately she has several wound openings at this point that secondary to her lymphedema/venous stasis are giving her trouble and leaking quite severely. She also has diabetes along with hypertension and stage III chronic kidney disease. Fortunately there is no signs of active infection at this time. She is going to likely require debridement of the left leg ulcer upon evaluation today just based on what I am seeing. Fortunately there is no wound opening on the right. Overall the patient seems to be doing quite well and again there is no evidence of systemic infection which is good news. No fevers, chills, nausea, vomiting, or diarrhea. 10/31/2018 on evaluation today patient actually appears to be doing excellent in regard to her lower extremity ulcers. She has been tolerating the dressing changes without complication. Fortunately there is no signs of active infection at this time. She has tolerated 3 layer compression wrap without complication. 11/07/2018 upon evaluation today patient actually appears to be doing very well with regard to her left lower extremity ulcers. She has been tolerating the dressing changes without complication. Fortunately there is no signs of active infection. No fevers, chills, nausea, vomiting, or diarrhea. 11/21/2018 upon evaluation today patient appears to be  doing quite well with regard to her lower extremity ulcers. In fact both areas seem to be showing signs of good improvement which is excellent. She is not having as much pain as she has in the past and again has a lot of healing compared to previous visits as well. 12/05/2018 on evaluation today patient presents for follow-up concerning her ongoing issues with her bilateral lower extremity ulcers. The good news is her right lower extremity is showing signs of completely healing at this time which is great news. Fortunately there is no evidence of active infection. On  the left she has just a very small area still remaining that is open at this time all in all she is very close to complete closure in my opinion. She does have compression stockings to wear at home. 12/12/2018 on evaluation today patient appears to be doing excellent in regard to her left lateral lower extremity ulcer. She has been tolerating the dressing changes without complication. Fortunately there is no signs of active infection at this time. In fact this appears to be pretty much healed at this point although again she is not 100% today. No fevers, chills, nausea, vomiting, or diarrhea. 12/19/2018 on evaluation today patient actually appears to be doing excellent with regard to her lower extremity ulcer in fact this appears to be completely healed today which is all some. She has done extremely well with wound care measures. No fevers, chills, nausea, vomiting, or diarrhea. ------------------------------------ 11/01/19-Readmission to the clinic Patient presents with left leg pain, wounds posterior calf, onset about 4 weeks, denies any fevers chills or shakes, has not been using anything to these wounds, was given compression stockings at last discharge from the clinic but has not been able to use them as they rolled down and cause creases Patient's history significant for type 2 diabetes insulin requiring, A1c of 6.9 lately,  hypertension, chronic pain 8/18; patient readmitted that the clinic last week. She has wounds on her left lateral posterior lower leg all of this in close juxtaposition i.e. a localized site. We've been using silver alginate under 3 layer compression apparently the wound surface area is better. She was wearing compression stockings from elastic therapy but says they were falling down. As she progresses more towards healing will need to address what we use in terms of compression stockings perhaps external compression garments 11/17/2019 on evaluation today patient appears to be doing well with regard to her legs currently. She does have 2 wounds which are actually measuring much better the more posterior is actually doing very well which I am pleased with the other though smaller is not quite a small but nonetheless on the lateral aspect does seem to be improving. 11/23/2019 on evaluation today patient appears to be doing well in regard to her wounds. In fact the wound on the posterior aspect of her leg appears healed the lateral aspect is still open but extremely small. Babic, Silva ([id number]) Objective Constitutional Well-nourished and well-hydrated in no acute distress. Vitals Time Taken: 2:45 AM, Height: 67 in, Weight: 317 lbs, BMI: 49.6, Temperature: 100.0 F, Pulse: 76 bpm, Respiratory Rate: 24 breaths/min, Blood Pressure: 145/118 mmHg. Respiratory normal breathing without difficulty. Psychiatric this patient is able to make decisions and demonstrates good insight into disease process. Alert and Oriented x 3. pleasant and cooperative. General Notes: Patient's wound bed currently showed signs of good granulation at this time and excellent epithelization the posterior wound is completely healed the lateral wound does not appear to be doing poorly at all which is great news and overall very pleased with how this is appearing today. This is a very slight and superficial wound  currently. Integumentary (Hair, Skin) Wound #4 status is Open. Original cause of wound was Gradually Appeared. The wound is located on the Left,Lateral Lower Leg. The wound measures 0.5cm length x 0.5cm width x 0.1cm depth; 0.196cm^2 area and 0.02cm^3 volume. There is Fat Layer (Subcutaneous Tissue) exposed. There is a medium amount of serosanguineous drainage noted. The wound margin is flat and intact. There is small (1-33%) red granulation within the wound  bed. There is a small (1-33%) amount of necrotic tissue within the wound bed including Adherent Slough. Wound #5 status is Healed - Epithelialized. Original cause of wound was Gradually Appeared. The wound is located on the Left,Posterior Lower Leg. The wound measures 0cm length x 0cm width x 0cm depth; 0cm^2 area and 0cm^3 volume. There is Fat Layer (Subcutaneous Tissue) exposed. There is a medium amount of serosanguineous drainage noted. The wound margin is flat and intact. There is small (1-33%) red granulation within the wound bed. There is a medium (34-66%) amount of necrotic tissue within the wound bed including Adherent Slough. Assessment Active Problems ICD-10 Non-pressure chronic ulcer of left calf limited to breakdown of skin Venous insufficiency (chronic) (peripheral) Procedures Wound #4 Pre-procedure diagnosis of Wound #4 is a Venous Leg Ulcer located on the Left,Lateral Lower Leg . There was a Three Layer Compression Therapy Procedure by Grover Canavan, RN. Post procedure Diagnosis Wound #4: Same as Pre-Procedure Plan Wound Cleansing: Wound #4 Left,Lateral Lower Leg: May shower with protection. Skin Barriers/Peri-Wound Care: Wound #4 Left,Lateral Lower Leg: Moisturizing lotion Triamcinolone Acetonide Ointment (TCA) Primary Wound Dressing: Wound #4 Left,Lateral Lower Leg: Silver Alginate Secondary Dressing: Wound #4 Left,Lateral Lower Leg: Washer, Kinslee ([id number]) ABD pad Dressing Change Frequency: Wound #4  Left,Lateral Lower Leg: Change dressing every week Follow-up Appointments: Wound #4 Left,Lateral Lower Leg: Return Appointment in 1 week. Edema Control: Wound #4 Left,Lateral Lower Leg: 3 Layer Compression System - Left Lower Extremity Elevate legs to the level of the heart and pump ankles as often as possible 1. I would recommend at this point that we continue the silver alginate dressing to the wound. 2. I am also can recommend that we have the patient continue with the 3 layer compression wrap to the left leg. 3. With regard to the Velcro compression wrap this is also from the left leg as far size and is concerned is not really can work on the right leg in my opinion. 4. Once we get her heel we will transition to the Velcro compression wrap on the left. We will see patient back for reevaluation in 1 week here in the clinic. If anything worsens or changes patient will contact our office for additional recommendations. Electronic Signature(s) Signed: 11/23/2019 3:35:10 PM By: Worthy Keeler PA-C Entered By: Worthy Keeler on 11/23/2019 15:35:10 Rogue, [name] ([id number]) -------------------------------------------------------------------------------- SuperBill Details Patient Name: [name] Date of Service: 11/23/2019 Medical Record Number: [id number] Patient Account Number: [id number] Date of Birth/Sex: 04-Sep-1965 (54 y.o. 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 Venous insufficiency (chronic) (peripheral) Facility Procedures CPT4 Code: [id number] Description: (Facility Use Only) [phone] - [place] LWR LT LEG Modifier: Quantity: 1 Physician Procedures CPT4 Code: [id number] Description: 46568 -  [facility] PHYS LEVEL 3 - EST PT Modifier: Quantity: 1 CPT4 Code: Description: ICD-10 Diagnosis Description L97.221 Non-pressure chronic ulcer of left calf limited to breakdown of skin I87.2 Venous insufficiency (chronic) (peripheral) Modifier: Quantity: Electronic Signature(s) Signed: 11/23/2019 3:35:20 PM By: Worthy Keeler PA-C Entered By: Worthy Keeler on 11/23/2019 15:35:20

## 2019-11-23 NOTE — Progress Notes (Addendum)
AIVY, AKTER ([id number]) Visit Report for 11/23/2019 Arrival Information Details Patient Name: [name], [name] Date of Service: 11/23/2019 2:30 PM Medical Record Number: [id number] Patient Account Number: [id number] Date of Birth/Sex: [birth year] (54 y.o. F) Treating RN: Cornell Barman Primary Care Lalaine Overstreet: Alean Rinne Other Clinician: Referring Jonavin Seder: Alean Rinne Treating Sundeep Cary/Extender: Melburn Hake, HOYT Weeks in Treatment: 3 Visit Information History Since Last Visit All ordered tests and consults were completed: No Patient Arrived: Ambulatory Added or deleted any medications: No Arrival Time: 15:03 Any new allergies or adverse reactions: No Accompanied By: self Had a fall or experienced change in No Transfer Assistance: Manual activities of daily living that may affect Patient Identification Verified: Yes risk of falls: Secondary Verification Process Completed: Yes Signs or symptoms of abuse/neglect since last visito No Patient Requires Transmission-Based Precautions: No Hospitalized since last visit: No Patient Has Alerts: No Implantable device outside of the clinic excluding No cellular tissue based products placed in the center since last visit: Has Dressing in Place as Prescribed: Yes Has Compression in Place as Prescribed: Yes Pain Present Now: No Electronic Signature(s) Signed: 11/23/2019 4:36:13 PM By: Darci Needle Entered By: Darci Needle on 11/23/2019 15:03:46 Bisono, Mikinzie ([id number]) -------------------------------------------------------------------------------- Compression Therapy Details Patient Name: [name] Date of Service: 11/23/2019 2:30 PM Medical Record Number: [id number] Patient Account Number: [id number] Date of Birth/Sex: 19-Jun-1965 (54 y.o. F) Treating RN: Grover Canavan Primary Care Shmuel Girgis: Alean Rinne Other Clinician: Referring Dalessandro Baldyga: Alean Rinne Treating Nethaniel Mattie/Extender: Melburn Hake, HOYT Weeks in Treatment:  3 Compression Therapy Performed for Wound Assessment: Wound #4 Left,Lateral Lower Leg Performed By: Clinician Grover Canavan, RN Compression Type: Three Layer Post Procedure Diagnosis Same as Pre-procedure Electronic Signature(s) Signed: 11/23/2019 4:51:51 PM By: Grover Canavan Entered By: Grover Canavan on 11/23/2019 15:30:19 Underberg, [name] ([id number]) -------------------------------------------------------------------------------- Encounter Discharge Information Details Patient Name: [name] Date of Service: 11/23/2019 2:30 PM Medical Record Number: [id number] Patient Account Number: [id number] Date of Birth/Sex: 1965/03/28 (54 y.o. F) Treating RN: Grover Canavan Primary Care Flonnie Wierman: Alean Rinne Other Clinician: Referring Leibish Mcgregor: Alean Rinne Treating Ligaya Cormier/Extender: Sharalyn Ink in Treatment: 3 Encounter Discharge Information Items Discharge Condition: Stable Ambulatory Status: Ambulatory Discharge Destination: Home Transportation: Other Accompanied By: self Schedule Follow-up Appointment: Yes Clinical Summary of Care: Electronic Signature(s) Signed: 11/23/2019 4:51:51 PM By: Grover Canavan Entered By: Grover Canavan on 11/23/2019 15:31:32 [place], [name] ([id number]) -------------------------------------------------------------------------------- Lower Extremity Assessment Details Patient Name: [name] Date of Service: 11/23/2019 2:30 PM Medical Record Number: [id number] Patient Account Number: [id number] Date of Birth/Sex: Mar 06, 1966 (54 y.o. F) Treating RN: Cornell Barman Primary Care Girolamo Lortie: Alean Rinne Other Clinician: Referring Jovanna Hodges: Alean Rinne Treating Kathyjo Briere/Extender: Melburn Hake, HOYT Weeks in Treatment: 3 Edema Assessment Assessed: [Left: Yes] [Right: No] Edema: [Left: Ye] [Right: s] Calf Left: Right: Point of Measurement: 33 cm From Medial Instep 46 cm cm Ankle Left: Right: Point of Measurement: 10 cm From Medial  Instep 26.5 cm cm Vascular Assessment Pulses: Dorsalis Pedis Palpable: [Left:Yes] Posterior Tibial Palpable: [Left:Yes] Electronic Signature(s) Signed: 11/23/2019 4:36:13 PM By: Darci Needle Signed: 11/23/2019 6:11:40 PM By: Gretta Cool, BSN, RN, CWS, Kim RN, BSN Entered By: Darci Needle on 11/23/2019 15:11:39 Gilpatrick, Mikalyn ([id number]) -------------------------------------------------------------------------------- Multi Wound Chart Details Patient Name: [name] Date of Service: 11/23/2019 2:30 PM Medical Record Number: [id number] Patient Account Number: [id number] Date of Birth/Sex: 12/26/1965 (54 y.o. 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 of the Lower N/A Extremity Extremity Comorbid History: Congestive Heart Failure, Coronary Congestive Heart Failure, Coronary N/A Artery Disease, Hypertension, Artery Disease, Hypertension, Peripheral Venous Disease, Type II Peripheral Venous Disease, Type II Diabetes, End Stage Renal Disease, Diabetes, End Stage Renal Disease, Neuropathy Neuropathy Date Acquired: 09/16/2019 09/16/2019 N/A Weeks of Treatment: 3 3 N/A Wound Status: Open Open N/A Measurements L x W x D (cm) 0.5x0.5x0.1 0.5x0.3x0.1 N/A Area (cm) : 0.196 0.118 N/A Volume (cm) : 0.02 0.012 N/A % Reduction in Area: 79.20% 91.70% N/A % Reduction in Volume: 78.70% 91.50%  N/A Classification: Partial Thickness Partial Thickness N/A Exudate Amount: Medium Medium N/A Exudate Type: Serosanguineous Serosanguineous N/A Exudate Color: red, brown red, brown N/A Wound Margin: Flat and Intact Flat and Intact N/A Granulation Amount: Small (1-33%) Small (1-33%) N/A Granulation Quality: Red Red N/A Necrotic Amount: Small (1-33%) Medium (34-66%) N/A Exposed Structures: Fat Layer (Subcutaneous Tissue): Fat Layer (Subcutaneous Tissue): N/A Yes Yes Fascia: No Fascia: No Tendon: No Tendon: No Muscle: No Muscle: No Joint: No Joint: No Bone: No Bone: No Epithelialization: None N/A N/A Treatment Notes Electronic Signature(s) Signed: 11/23/2019 4:51:51 PM By: Grover Canavan Entered By: Grover Canavan on 11/23/2019 15:22:47 [place], [place] ([id number]) MAHAYLA, HADDAWAY ([id number]) -------------------------------------------------------------------------------- Multi-Disciplinary Care Plan Details Patient Name: [name] Date of Service: 11/23/2019 2:30 PM Medical Record Number: [id number] Patient Account Number: [id number] Date of Birth/Sex: [birth year] (54 y.o. F) Treating RN: Grover Canavan Primary Care Kamir Selover: Alean Rinne Other Clinician: Referring Ashlye Oviedo: Alean Rinne Treating Jarion Hawthorne/Extender: Melburn Hake, HOYT Weeks in Treatment: 3 Active Inactive Orientation to the Wound Care Program Nursing Diagnoses: Knowledge deficit related to the wound healing center program Goals: Patient/caregiver will verbalize understanding of the [place] Program Date Initiated: 11/08/2019 Target Resolution Date: 11/08/2019 Goal Status: Active Interventions: Provide education on orientation to the wound center Notes: Venous Leg Ulcer Nursing Diagnoses: Knowledge deficit related to disease process and management Goals: Patient will maintain optimal edema control Date Initiated: 11/08/2019 Target Resolution Date: 11/15/2019 Goal Status:  Active Interventions: Assess peripheral edema status every visit. Treatment Activities: Therapeutic compression applied : 11/08/2019 Notes: Wound/Skin Impairment Nursing Diagnoses: Impaired tissue integrity Goals: Patient/caregiver will verbalize understanding of skin care regimen Date Initiated: 11/08/2019 Target Resolution Date: 11/15/2019 Goal Status: Active Interventions: Assess ulceration(s) every visit Treatment Activities: Skin care regimen initiated : 11/08/2019 Topical wound management initiated : 11/08/2019 Notes: Electronic Signature(s) Signed: 11/23/2019 4:51:51 PM By: Dalbert Batman, Montasia ([id number]) Entered By: Grover Canavan on 11/23/2019 15:22:35 Eversley, [name] ([id number]) -------------------------------------------------------------------------------- Pain Assessment Details Patient Name: [name] Date of Service: 11/23/2019 2:30 PM Medical Record Number: [id number] Patient Account Number: [id number] Date of Birth/Sex: 1966/01/05 (54 y.o. F) Treating RN: Cornell Barman Primary Care Leilanee Righetti: Alean Rinne Other Clinician: Referring Candas Deemer: Alean Rinne Treating Trigg Delarocha/Extender: Melburn Hake, HOYT Weeks in Treatment: 3 Active Problems Location of Pain Severity and Description of Pain Patient Has Paino No Site Locations With Dressing Change: No Pain Management and Medication Current Pain Management: Electronic Signature(s) Signed: 11/23/2019 4:36:13 PM By: Darci Needle Signed: 11/23/2019 6:11:40 PM By: Gretta Cool, BSN, RN, CWS, Kim RN, BSN Entered By: Darci Needle on 11/23/2019 15:04:32 [place], [name] ([id number]) -------------------------------------------------------------------------------- Patient/Caregiver Education Details Patient Name: [name] Date  of Service: 11/23/2019 2:30 PM Medical Record Number: [id number] Patient Account Number: [id number] Date of Birth/Gender: November 15, 1965 (54 y.o. F) Treating RN: Grover Canavan Primary Care  Physician: Alean Rinne Other Clinician: Referring Physician: Alean Rinne Treating Physician/Extender: Sharalyn Ink in Treatment: 3 Education Assessment Education Provided To: Patient Education Topics Provided Wound/Skin Impairment: Handouts: Caring for Your Ulcer Methods: Explain/Verbal Responses: State content correctly Electronic Signature(s) Signed: 11/23/2019 4:51:51 PM By: Grover Canavan Entered By: Grover Canavan on 11/23/2019 15:30:51 Mirkin, [place] ([id number]) -------------------------------------------------------------------------------- Wound Assessment Details Patient Name: [name] Date of Service: 11/23/2019 2:30 PM Medical Record Number: [id number] Patient Account Number: [id number] Date of Birth/Sex: 1965/07/16 (54 y.o. F) Treating RN: Cornell Barman Primary Care Dontavius Keim: Alean Rinne Other Clinician: Referring Hung Rhinesmith: Alean Rinne Treating Lanise Mergen/Extender: Melburn Hake, HOYT Weeks in Treatment: 3 Wound Status Wound Number: 4 Primary Venous Leg Ulcer Etiology: Wound Location: Left, Lateral Lower Leg Secondary Diabetic Wound/Ulcer of the Lower Extremity Wounding Event: Gradually Appeared Etiology: Date Acquired: 09/16/2019 Wound Open Weeks Of Treatment: 3 Status: Clustered Wound: No Comorbid Congestive Heart Failure, Coronary Artery Disease, History: Hypertension, Peripheral Venous Disease, Type II Diabetes, End Stage Renal Disease, Neuropathy Photos Wound Measurements Length: (cm) 0.5 % Red Width: (cm) 0.5 % Red Depth: (cm) 0.1 Epith Area: (cm) 0.196 Volume: (cm) 0.02 uction in Area: 79.2% uction in Volume: 78.7% elialization: None Wound Description Classification: Partial Thickness Foul Wound Margin: Flat and Intact Slou Exudate Amount: Medium Exudate Type: Serosanguineous Exudate Color: red, brown Odor After Cleansing: No gh/Fibrino Yes Wound Bed Granulation Amount: Small (1-33%) Exposed Structure Granulation Quality:  Red Fascia Exposed: No Necrotic Amount: Small (1-33%) Fat Layer (Subcutaneous Tissue) Exposed: Yes Necrotic Quality: Adherent Slough Tendon Exposed: No Muscle Exposed: No Joint Exposed: No Bone Exposed: No Treatment Notes Wound #4 (Left, Lateral Lower Leg) Notes scell, abd, 3LL Mccaster, Michole ([id number]) Electronic Signature(s) Signed: 11/23/2019 4:36:13 PM By: Darci Needle Signed: 11/23/2019 6:11:40 PM By: Gretta Cool, BSN, RN, CWS, Kim RN, BSN Entered By: Darci Needle on 11/23/2019 15:09:20 [place], [name] -------------------------------------------------------------------------------- Wound Assessment Details Patient Name: [name] Date of Service: 11/23/2019 2:30 PM Medical Record Number: [id number] Patient Account Number: [id number] Date of Birth/Sex: [birth year] (54 y.o. F) Treating RN: Grover Canavan Primary Care Jarelle Ates: Alean Rinne Other Clinician: Referring Jamarien Rodkey: Alean Rinne Treating Elyza Whitt/Extender: Melburn Hake, HOYT Weeks in Treatment: 3 Wound Status Wound Number: 5 Primary Venous Leg Ulcer Etiology: Wound Location: Left, Posterior Lower Leg Secondary Diabetic Wound/Ulcer of the Lower Extremity Wounding Event: Gradually Appeared Etiology: Date Acquired: 09/16/2019 Wound Healed - Epithelialized Weeks Of Treatment: 3 Status: Clustered Wound: No Comorbid Congestive Heart Failure, Coronary Artery Disease, History: Hypertension, Peripheral Venous Disease, Type II Diabetes, End Stage Renal Disease, Neuropathy Photos Wound Measurements Length: (cm) 0 Width: (cm) 0 Depth: (cm) 0 Area: (cm) 0 Volume: (cm) 0 % Reduction in Area: 100% % Reduction in Volume: 100% Wound Description Classification: Partial Thickness Wound Margin: Flat and Intact Exudate Amount: Medium Exudate Type: Serosanguineous Exudate Color: red, brown Foul Odor After Cleansing: No Slough/Fibrino Yes Wound Bed Granulation Amount: Small (1-33%) Exposed  Structure Granulation Quality: Red Fascia Exposed: No Necrotic Amount: Medium (34-66%) Fat Layer (Subcutaneous Tissue) Exposed: Yes Necrotic Quality: Adherent Slough Tendon Exposed: No Muscle Exposed: No Joint Exposed: No Bone Exposed: No Electronic Signature(s) Signed: 11/23/2019 4:51:51 PM By: Grover Canavan Entered By: Grover Canavan on 11/23/2019 15:25:31 [place], [name] -------------------------------------------------------------------------------- Juncal Details Patient Name: [name] Date of Service: 11/23/2019 2:30 PM Medical Record Number: [id number] Patient Account Number: [id number]  Date of Birth/Sex: 1966-03-21 (54 y.o. F) Treating RN: Cornell Barman Primary Care Kage Willmann: Alean Rinne Other Clinician: Referring Syanne Looney: Alean Rinne Treating Indy Kuck/Extender: Melburn Hake, HOYT Weeks in Treatment: 3 Vital Signs Time Taken: 02:45 Temperature (F): 100.0 Height (in): 67 Pulse (bpm): 76 Weight (lbs): 317 Respiratory Rate (breaths/min): 24 Body Mass Index (BMI): 49.6 Blood Pressure (mmHg): 145/118 Reference Range: 80 - 120 mg / dl Electronic Signature(s) Signed: 11/23/2019 4:36:13 PM By: Darci Needle Entered By: Darci Needle on 11/23/2019 15:04:18

## 2019-11-27 ENCOUNTER — Other Ambulatory Visit (HOSPITAL_COMMUNITY): Payer: Self-pay | Admitting: Cardiology

## 2019-11-29 ENCOUNTER — Encounter (INDEPENDENT_AMBULATORY_CARE_PROVIDER_SITE_OTHER): Payer: Medicare Other

## 2019-11-29 ENCOUNTER — Ambulatory Visit: Payer: Medicare Other | Admitting: Internal Medicine

## 2019-11-29 ENCOUNTER — Other Ambulatory Visit (INDEPENDENT_AMBULATORY_CARE_PROVIDER_SITE_OTHER): Payer: Self-pay | Admitting: Internal Medicine

## 2019-11-29 DIAGNOSIS — R0989 Other specified symptoms and signs involving the circulatory and respiratory systems: Secondary | ICD-10-CM

## 2019-11-29 DIAGNOSIS — M79606 Pain in leg, unspecified: Secondary | ICD-10-CM

## 2019-12-06 ENCOUNTER — Other Ambulatory Visit: Payer: Self-pay

## 2019-12-06 ENCOUNTER — Ambulatory Visit (INDEPENDENT_AMBULATORY_CARE_PROVIDER_SITE_OTHER): Payer: Medicare Other

## 2019-12-06 ENCOUNTER — Encounter: Payer: Medicare Other | Admitting: Internal Medicine

## 2019-12-06 DIAGNOSIS — L97221 Non-pressure chronic ulcer of left calf limited to breakdown of skin: Secondary | ICD-10-CM | POA: Diagnosis not present

## 2019-12-06 DIAGNOSIS — M79606 Pain in leg, unspecified: Secondary | ICD-10-CM | POA: Diagnosis not present

## 2019-12-06 DIAGNOSIS — R0989 Other specified symptoms and signs involving the circulatory and respiratory systems: Secondary | ICD-10-CM | POA: Diagnosis not present

## 2019-12-06 NOTE — Progress Notes (Addendum)
[name], [name] ([id number]) Visit Report for 12/06/2019 Arrival Information Details Patient Name: [name] Date of Service: 12/06/2019 9:00 AM Medical Record Number: [id number] Patient Account Number: [id number] Date of Birth/Sex: December 31, 1965 (54 y.o. F) Treating RN: Cornell Barman Primary Care Odean Fester: Alean Rinne Other Clinician: Referring Kaylyn Garrow: Alean Rinne Treating Phil Corti/Extender: Tito Dine in Treatment: 5 Visit Information History Since Last Visit Added or deleted any medications: No Patient Arrived: Ambulatory Any new allergies or adverse reactions: No Arrival Time: 08:58 Had a fall or experienced change in No Accompanied By: self activities of daily living that may affect Transfer Assistance: None risk of falls: Patient Identification Verified: Yes Signs or symptoms of abuse/neglect since last visito No Secondary Verification Process Completed: Yes Hospitalized since last visit: No Patient Requires Transmission-Based Precautions: No Implantable device outside of the clinic excluding No Patient Has Alerts: No cellular tissue based products placed in the center since last visit: Has Dressing in Place as Prescribed: No Has Compression in Place as Prescribed: No Pain Present Now: Yes Notes states she removed compression wrap because she did not come for her previous appt. Electronic Signature(s) Signed: 12/06/2019 1:18:34 PM By: Darci Needle Previous Signature: 12/06/2019 9:02:33 AM Version By: Lorine Bears RCP, RRT, CHT Entered By: Darci Needle on 12/06/2019 09:07:12 Hackley, [name] ([id number]) -------------------------------------------------------------------------------- Compression Therapy Details Patient Name: [name] Date of Service: 12/06/2019 9:00 AM Medical Record Number: [id number] Patient Account Number: [id number] Date of Birth/Sex: [birth year] (54 y.o. F) Treating RN: Cornell Barman Primary Care Oma Alpert: Alean Rinne Other Clinician: Referring Latron Ribas: Alean Rinne Treating Peng Thorstenson/Extender: Tito Dine in Treatment: 5 Compression Therapy Performed for Wound Assessment: Wound #4 Left,Lateral Lower Leg Performed By: Clinician Cornell Barman, RN Compression Type: Three Layer Pre Treatment ABI: 1 Post Procedure Diagnosis Same as Pre-procedure Electronic Signature(s) Signed: 01/11/2020 6:08:34 PM By: Gretta Cool, BSN, RN, CWS, Kim RN, BSN Entered By: Gretta Cool, BSN, RN, CWS, Kim on 12/06/2019 09:14:29 Stillman, [name] ([id number]) -------------------------------------------------------------------------------- Encounter Discharge Information Details Patient Name: [name] Date of Service: 12/06/2019 9:00 AM Medical Record Number: [id number] Patient Account Number: [id number] Date of Birth/Sex: 02-Feb-1966 (54 y.o. F) Treating RN: Cornell Barman Primary Care Isiaih Hollenbach: Alean Rinne Other Clinician: Referring Lindzie Boxx: Alean Rinne Treating Taheerah Guldin/Extender: Tito Dine in Treatment: 5 Encounter Discharge Information Items Discharge Condition: Stable Ambulatory Status: Ambulatory Discharge Destination: Home Transportation: Private Auto Accompanied By: self Schedule Follow-up Appointment: Yes Clinical Summary of Care: Electronic Signature(s) Signed: 01/11/2020 6:08:34 PM By: Gretta Cool, BSN, RN, CWS, Kim RN, BSN Entered By: Gretta Cool, BSN, RN, CWS, Kim on 12/06/2019 09:16:35 Deaton, [name] ([id number]) -------------------------------------------------------------------------------- Lower Extremity Assessment Details Patient Name: [name] Date of Service: 12/06/2019 9:00 AM Medical Record Number: [id number] Patient Account Number: [id number] Date of Birth/Sex: 04-Nov-1965 (54 y.o. F) Treating RN: Cornell Barman Primary Care Kamaiya Antilla: Alean Rinne Other Clinician: Referring Sakoya Win: Alean Rinne Treating Carsen Machi/Extender: Tito Dine in Treatment: 5 Edema  Assessment Assessed: [Left: Yes] [Right: No] Edema: [Left: Ye] [Right: s] Calf Left: Right: Point of Measurement: 33 cm From Medial Instep 38.5 cm cm Ankle Left: Right: Point of Measurement: 10 cm From Medial Instep 29 cm cm Vascular Assessment Pulses: Dorsalis Pedis Palpable: [Left:Yes] Posterior Tibial Palpable: [Left:Yes] Electronic Signature(s) Signed: 12/06/2019 1:18:34 PM By: Darci Needle Signed: 01/11/2020 6:08:34 PM By: Gretta Cool, BSN, RN, CWS, Kim RN, BSN Entered By: Darci Needle on 12/06/2019 09:09:26 Senger, [name] ([id number]) -------------------------------------------------------------------------------- Multi Wound Chart Details Patient Name: [name] Date of Service: 12/06/2019 9:00 AM Medical Record Number: [id number]  Patient Account Number: [id number] Date of Birth/Sex: 02/08/1966 (54 y.o. F) Treating RN: Cornell Barman Primary Care Rickayla Wieland: Alean Rinne Other Clinician: Referring Graeson Nouri: Alean Rinne Treating Lyric Hoar/Extender: Tito Dine in Treatment: 5 Vital Signs Height(in): 67 Pulse(bpm): 77 Weight(lbs): 317 Blood Pressure(mmHg): 159/69 Body Mass Index(BMI): 50 Temperature(F): 98.2 Respiratory Rate(breaths/min): 18 Photos: [N/A:N/A] Wound Location: Left, Lateral Lower Leg N/A N/A Wounding Event: Gradually Appeared N/A N/A Primary Etiology: Venous Leg Ulcer N/A N/A Secondary Etiology: Diabetic Wound/Ulcer of the Lower N/A N/A Extremity Comorbid History: Congestive Heart Failure, Coronary N/A N/A Artery Disease, Hypertension, Peripheral Venous Disease, Type II Diabetes, End Stage Renal Disease, Neuropathy Date Acquired: 09/16/2019 N/A N/A Weeks of Treatment: 5 N/A N/A Wound Status: Open N/A N/A Measurements L x W x D (cm) 0.4x0.4x0.1 N/A N/A Area (cm) : 0.126 N/A N/A Volume (cm) : 0.013 N/A N/A % Reduction in Area: 86.60% N/A N/A % Reduction in Volume: 86.20% N/A N/A Classification: Partial Thickness N/A N/A Exudate  Amount: Medium N/A N/A Exudate Type: Serosanguineous N/A N/A Exudate Color: red, brown N/A N/A Wound Margin: Flat and Intact N/A N/A Granulation Amount: Small (1-33%) N/A N/A Granulation Quality: Red N/A N/A Necrotic Amount: Small (1-33%) N/A N/A Exposed Structures: Fat Layer (Subcutaneous Tissue): N/A N/A Yes Fascia: No Tendon: No Muscle: No Joint: No Bone: No Epithelialization: None N/A N/A Procedures Performed: Compression Therapy N/A N/A Treatment Notes Wound #4 (Left, Lateral Lower Leg) Notes Rollinson, [name] ([id number]) scell, abd, 3LL Electronic Signature(s) Signed: 12/07/2019 4:37:14 PM By: Linton Ham MD Entered By: Linton Ham on 12/06/2019 09:20:06 Tarpley, [name] ([id number]) -------------------------------------------------------------------------------- Multi-Disciplinary Care Plan Details Patient Name: [name] Date of Service: 12/06/2019 9:00 AM Medical Record Number: [id number] Patient Account Number: [id number] Date of Birth/Sex: [birth year] (54 y.o. F) Treating RN: Cornell Barman Primary Care Belkis Norbeck: Alean Rinne Other Clinician: Referring Keshia Weare: Alean Rinne Treating Shaneen Reeser/Extender: Tito Dine in Treatment: 5 Active Inactive Orientation to the Wound Care Program Nursing Diagnoses: Knowledge deficit related to the wound healing center program Goals: Patient/caregiver will verbalize understanding of the [facility] Date Initiated: 11/08/2019 Target Resolution Date: 11/08/2019 Goal Status: Active Interventions: Provide education on orientation to the wound center Notes: Venous Leg Ulcer Nursing Diagnoses: Knowledge deficit related to disease process and management Goals: Patient will maintain optimal edema control Date Initiated: 11/08/2019 Target Resolution Date: 11/15/2019 Goal Status: Active Interventions: Assess peripheral edema status every visit. Treatment Activities: Therapeutic compression  applied : 11/08/2019 Notes: Wound/Skin Impairment Nursing Diagnoses: Impaired tissue integrity Goals: Patient/caregiver will verbalize understanding of skin care regimen Date Initiated: 11/08/2019 Target Resolution Date: 11/15/2019 Goal Status: Active Interventions: Assess ulceration(s) every visit Treatment Activities: Skin care regimen initiated : 11/08/2019 Topical wound management initiated : 11/08/2019 Notes: Electronic Signature(s) Signed: 01/11/2020 6:08:34 PM By: Gretta Cool, BSN, RN, CWS, Kim RN, BSN Nazaryan, [name] ([id number]) Entered By: Gretta Cool, BSN, RN, CWS, Kim on 12/06/2019 09:13:49 Sinatra, [name] ([id number]) -------------------------------------------------------------------------------- Pain Assessment Details Patient Name: [name] Date of Service: 12/06/2019 9:00 AM Medical Record Number: [id number] Patient Account Number: [id number] Date of Birth/Sex: 1965/06/14 (54 y.o. F) Treating RN: Cornell Barman Primary Care Malkia Nippert: Alean Rinne Other Clinician: Referring Acasia Skilton: Alean Rinne Treating Shaquan Puerta/Extender: Tito Dine in Treatment: 5 Active Problems Location of Pain Severity and Description of Pain Patient Has Paino Yes Site Locations Pain Location: Pain in Ulcers Rate the pain. Current Pain Level: 2 Worst Pain Level: 4 Least Pain Level: 1 Tolerable Pain Level: 3 Pain Management and Medication Current Pain Management: Electronic Signature(s)  Signed: 12/06/2019 1:18:34 PM By: Darci Needle Signed: 01/11/2020 6:08:34 PM By: Gretta Cool, BSN, RN, CWS, Kim RN, BSN Entered By: Darci Needle on 12/06/2019 09:07:54 [name], [name] ([id number]) -------------------------------------------------------------------------------- Patient/Caregiver Education Details Patient Name: [name] Date of Service: 12/06/2019 9:00 AM Medical Record Number: [id number] Patient Account Number: [id number] Date of Birth/Gender: 1965/08/28 (54 y.o. F) Treating RN:  Cornell Barman Primary Care Physician: Alean Rinne Other Clinician: Referring Physician: Alean Rinne Treating Physician/Extender: Tito Dine in Treatment: 5 Education Assessment Education Provided To: Patient Education Topics Provided Venous: Handouts: Controlling Swelling with Multilayered Compression Wraps Methods: Demonstration, Explain/Verbal Responses: State content correctly Wound/Skin Impairment: Handouts: Caring for Your Ulcer Methods: Demonstration, Explain/Verbal Responses: State content correctly Electronic Signature(s) Signed: 01/11/2020 6:08:34 PM By: Gretta Cool, BSN, RN, CWS, Kim RN, BSN Entered By: Gretta Cool, BSN, RN, CWS, Kim on 12/06/2019 09:15:56 [name], [name] ([id number]) -------------------------------------------------------------------------------- Wound Assessment Details Patient Name: [name] Date of Service: 12/06/2019 9:00 AM Medical Record Number: [id number] Patient Account Number: [id number] Date of Birth/Sex: [birth year] (54 y.o. F) Treating RN: Cornell Barman Primary Care Tyjae Issa: Alean Rinne Other Clinician: Referring Legacie Dillingham: Alean Rinne Treating [name]/Extender: Tito Dine in Treatment: 5 Wound Status Wound Number: 4 Primary Venous Leg Ulcer Etiology: Wound Location: Left, Lateral Lower Leg Secondary Diabetic Wound/Ulcer of the Lower Extremity Wounding Event: Gradually Appeared Etiology: Date Acquired: 09/16/2019 Wound Open Weeks Of Treatment: 5 Status: Clustered Wound: No Comorbid Congestive Heart Failure, Coronary Artery Disease, History: Hypertension, Peripheral Venous Disease, Type II Diabetes, End Stage Renal Disease, Neuropathy Photos Wound Measurements Length: (cm) 0.4 Width: (cm) 0.4 Depth: (cm) 0.1 Area: (cm) 0.126 Volume: (cm) 0.013 % Reduction in Area: 86.6% % Reduction in Volume: 86.2% Epithelialization: None Wound Description Classification: Partial Thickness Wound Margin: Flat and  Intact Exudate Amount: Medium Exudate Type: Serosanguineous Exudate Color: red, brown Foul Odor After Cleansing: No Slough/Fibrino Yes Wound Bed Granulation Amount: Small (1-33%) Exposed Structure Granulation Quality: Red Fascia Exposed: No Necrotic Amount: Small (1-33%) Fat Layer (Subcutaneous Tissue) Exposed: Yes Necrotic Quality: Adherent Slough Tendon Exposed: No Muscle Exposed: No Joint Exposed: No Bone Exposed: No Electronic Signature(s) Signed: 12/06/2019 1:18:34 PM By: Darci Needle Signed: 01/11/2020 6:08:34 PM By: Gretta Cool, BSN, RN, CWS, Kim RN, BSN Entered By: Darci Needle on 12/06/2019 09:08:53 [place], [name] ([id number]) -------------------------------------------------------------------------------- [place] Details Patient Name: [name] Date of Service: 12/06/2019 9:00 AM Medical Record Number: [id number] Patient Account Number: [id number] Date of Birth/Sex: 21-Jul-1965 (54 y.o. F) Treating RN: Cornell Barman Primary Care Nyaire Denbleyker: Alean Rinne Other Clinician: Referring [place] Willow: Alean Rinne Treating Charistopher Rumble/Extender: Tito Dine in Treatment: 5 Vital Signs Time Taken: 08:55 Temperature (F): 98.2 Height (in): 67 Pulse (bpm): 77 Weight (lbs): 317 Respiratory Rate (breaths/min): 18 Body Mass Index (BMI): 49.6 Blood Pressure (mmHg): 159/69 Reference Range: 80 - 120 mg / dl Electronic Signature(s) Signed: 12/06/2019 1:18:34 PM By: Darci Needle Previous Signature: 12/06/2019 9:02:33 AM Version By: Lorine Bears RCP, RRT, CHT Entered By: Darci Needle on 12/06/2019 09:07:22

## 2019-12-13 ENCOUNTER — Ambulatory Visit: Payer: Medicare Other | Admitting: Internal Medicine

## 2019-12-19 ENCOUNTER — Other Ambulatory Visit (HOSPITAL_COMMUNITY): Payer: Self-pay | Admitting: Cardiology

## 2019-12-20 ENCOUNTER — Ambulatory Visit: Payer: Medicare Other | Admitting: Internal Medicine

## 2019-12-26 ENCOUNTER — Other Ambulatory Visit (HOSPITAL_COMMUNITY): Payer: Self-pay | Admitting: Cardiology

## 2019-12-27 ENCOUNTER — Encounter: Payer: Medicare Other | Attending: Internal Medicine | Admitting: Internal Medicine

## 2019-12-27 ENCOUNTER — Other Ambulatory Visit: Payer: Self-pay

## 2019-12-27 DIAGNOSIS — N183 Chronic kidney disease, stage 3 unspecified: Secondary | ICD-10-CM | POA: Insufficient documentation

## 2019-12-27 DIAGNOSIS — I872 Venous insufficiency (chronic) (peripheral): Secondary | ICD-10-CM | POA: Diagnosis not present

## 2019-12-27 DIAGNOSIS — L97221 Non-pressure chronic ulcer of left calf limited to breakdown of skin: Secondary | ICD-10-CM | POA: Diagnosis not present

## 2019-12-27 DIAGNOSIS — E1122 Type 2 diabetes mellitus with diabetic chronic kidney disease: Secondary | ICD-10-CM | POA: Diagnosis not present

## 2019-12-27 DIAGNOSIS — I890 Lymphedema, not elsewhere classified: Secondary | ICD-10-CM | POA: Insufficient documentation

## 2019-12-27 DIAGNOSIS — Z794 Long term (current) use of insulin: Secondary | ICD-10-CM | POA: Diagnosis not present

## 2019-12-27 DIAGNOSIS — I129 Hypertensive chronic kidney disease with stage 1 through stage 4 chronic kidney disease, or unspecified chronic kidney disease: Secondary | ICD-10-CM | POA: Diagnosis not present

## 2019-12-27 DIAGNOSIS — E11622 Type 2 diabetes mellitus with other skin ulcer: Secondary | ICD-10-CM | POA: Insufficient documentation

## 2019-12-29 NOTE — Progress Notes (Signed)
SHAMARIE, CALL ([id number]) Visit Report for 12/27/2019 HPI Details Patient Name: [name], [name] Date of Service: 12/27/2019 11:00 AM Medical Record Number: [id number] Patient Account Number: [id number] Date of Birth/Sex: 09/01/1965 (54 y.o. F) Treating RN: Army Melia Primary Care Provider: Alean Rinne Other Clinician: Referring Provider: Alean Rinne Treating Provider/Extender: Tito Dine in Treatment: 8 History of Present Illness HPI Description: 10/24/2018 on evaluation today patient presents for initial evaluation or clinic concerning issues that she has been having with lymphedema for quite some time. Unfortunately she has several wound openings at this point that secondary to her lymphedema/venous stasis are giving her trouble and leaking quite severely. She also has diabetes along with hypertension and stage III chronic kidney disease. Fortunately there is no signs of active infection at this time. She is going to likely require debridement of the left leg ulcer upon evaluation today just based on what I am seeing. Fortunately there is no wound opening on the right. Overall the patient seems to be doing quite well and again there is no evidence of systemic infection which is good news. No fevers, chills, nausea, vomiting, or diarrhea. 10/31/2018 on evaluation today patient actually appears to be doing excellent in regard to her lower extremity ulcers. She has been tolerating the dressing changes without complication. Fortunately there is no signs of active infection at this time. She has tolerated 3 layer compression wrap without complication. 11/07/2018 upon evaluation today patient actually appears to be doing very well with regard to her left lower extremity ulcers. She has been tolerating the dressing changes without complication. Fortunately there is no signs of active infection. No fevers, chills, nausea, vomiting, or diarrhea. 11/21/2018 upon evaluation today patient  appears to be doing quite well with regard to her lower extremity ulcers. In fact both areas seem to be showing signs of good improvement which is excellent. She is not having as much pain as she has in the past and again has a lot of healing compared to previous visits as well. 12/05/2018 on evaluation today patient presents for follow-up concerning her ongoing issues with her bilateral lower extremity ulcers. The good news is her right lower extremity is showing signs of completely healing at this time which is great news. Fortunately there is no evidence of active infection. On the left she has just a very small area still remaining that is open at this time all in all she is very close to complete closure in my opinion. She does have compression stockings to wear at home. 12/12/2018 on evaluation today patient appears to be doing excellent in regard to her left lateral lower extremity ulcer. She has been tolerating the dressing changes without complication. Fortunately there is no signs of active infection at this time. In fact this appears to be pretty much healed at this point although again she is not 100% today. No fevers, chills, nausea, vomiting, or diarrhea. 12/19/2018 on evaluation today patient actually appears to be doing excellent with regard to her lower extremity ulcer in fact this appears to be completely healed today which is all some. She has done extremely well with wound care measures. No fevers, chills, nausea, vomiting, or diarrhea. ------------------------------------ 11/01/19-Readmission to the clinic Patient presents with left leg pain, wounds posterior calf, onset about 4 weeks, denies any fevers chills or shakes, has not been using anything to these wounds, was given compression stockings at last discharge from the clinic but has not been able to use them as they rolled down and  cause creases Patient's history significant for type 2 diabetes insulin requiring, A1c of 6.9  lately, hypertension, chronic pain 8/18; patient readmitted that the clinic last week. She has wounds on her left lateral posterior lower leg all of this in close juxtaposition i.e. a localized site. We've been using silver alginate under 3 layer compression apparently the wound surface area is better. She was wearing compression stockings from elastic therapy but says they were falling down. As she progresses more towards healing will need to address what we use in terms of compression stockings perhaps external compression garments 11/17/2019 on evaluation today patient appears to be doing well with regard to her legs currently. She does have 2 wounds which are actually measuring much better the more posterior is actually doing very well which I am pleased with the other though smaller is not quite a small but nonetheless on the lateral aspect does seem to be improving. 11/23/2019 on evaluation today patient appears to be doing well in regard to her wounds. In fact the wound on the posterior aspect of her leg appears healed the lateral aspect is still open but extremely small. 9/15; this is a patient with chronic venous insufficiency and secondary lymphedema. She did not keep her clinic appointment last week and hence the left leg is a lot more swollen since she had to take the wrap off at some point. She has a small weeping area on the left lateral lower leg. She has probably a juxta lite stocking for the left leg I have asked her to bring that in when she comes to her clinic appointment next week at which time she should be healed 10/6; this is a patient with chronic venous insufficiency and secondary lymphedema left greater than right. Skin changes of chronic lymphedema especially in the left leg. She has not been here in 3 weeks. She took the wrap off 2 weeks ago. She has some very old 20/30 stockings from elastic therapy in [place] she has been using. Fortunately her leg is closed. 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, maintenance) Signed: 12/28/2019 5:02:44 PM By: Gretta Cool, BSN, RN, CWS, Kim RN, BSN [place], [place] ([id number]) Signed: 12/29/2019 8:13:46 AM By: Linton Ham MD Previous Signature: 12/27/2019 4:14:27 PM Version By: Linton Ham MD Entered By: Gretta Cool, BSN, RN, CWS, Kim on 12/28/2019 17:02:43 ZAYNEB, BAUCUM ([id number]) -------------------------------------------------------------------------------- Physical Exam Details Patient Name: [name] Date of Service: 12/27/2019 11:00 AM Medical Record Number: [id number] Patient Account Number: [id number] Date of Birth/Sex: 12/31/1965 (54 y.o. F) Treating RN: Army Melia Primary Care Provider: Alean Rinne Other Clinician: Referring Provider: Alean Rinne Treating Provider/Extender: Tito Dine in Treatment: 8 Constitutional Sitting or standing Blood Pressure is within target range for patient.. Pulse regular and within target range for patient.Marland Kitchen Respirations regular, non- labored and within target range.. Temperature is normal and within the target range for the patient.Marland Kitchen appears in no distress. Cardiovascular Nonpitting edema of the left greater than right leg. Notes Wound exam; the patient has no open wound on the left lateral calf this is completely closed. This is fortunate since she still has significant the edema in the left leg. Her skin is very dry fissured and hyperkeratotic. She has no evidence of an acute process DVT or significant PAD. The edema in the left leg is considerably more than the right Electronic Signature(s) Signed: 12/28/2019 5:03:32 PM By: Gretta Cool, BSN, RN, CWS, Kim RN, BSN Signed: 12/29/2019 8:13:46 AM By: Linton Ham MD  Previous Signature: 12/27/2019 4:14:27 PM Version By: Linton Ham MD Entered By: Gretta Cool, BSN, RN, CWS, Kim on 12/28/2019 17:03:32 [name], [name]  ([id number]) -------------------------------------------------------------------------------- Physician Orders Details Patient Name: [name] Date of Service: 12/27/2019 11:00 AM Medical Record Number: [id number] Patient Account Number: [id number] Date of Birth/Sex: [birth year] (54 y.o. F) Treating RN: Army Melia Primary Care Provider: Alean Rinne Other Clinician: Referring Provider: Alean Rinne Treating Provider/Extender: Tito Dine in Treatment: 8 Verbal / Phone Orders: No Diagnosis Coding Discharge From [facility] Services o Discharge from [place] Complete Electronic Signature(s) Signed: 12/28/2019 5:03:43 PM By: Gretta Cool, BSN, RN, CWS, Kim RN, BSN Signed: 12/29/2019 8:13:46 AM By: Linton Ham MD Previous Signature: 12/28/2019 5:00:47 PM Version By: Gretta Cool BSN, RN, CWS, Kim RN, BSN Previous Signature: 12/27/2019 2:10:04 PM Version By: Army Melia Entered By: Gretta Cool BSN, RN, CWS, Kim on 12/28/2019 17:03:43 [name], [name] ([id number]) -------------------------------------------------------------------------------- Problem List Details Patient Name: [name] Date of Service: 12/27/2019 11:00 AM Medical Record Number: [id number] Patient Account Number: [id number] Date of Birth/Sex: 1965-05-14 (54 y.o. F) Treating RN: Army Melia Primary Care Provider: Alean Rinne Other Clinician: Referring Provider: Alean Rinne Treating Provider/Extender: Tito Dine in Treatment: 8 Active Problems ICD-10 Encounter Code Description Active Date MDM Diagnosis L97.221 Non-pressure chronic ulcer of left calf limited to breakdown of skin 11/01/2019 No Yes I87.2 Venous insufficiency (chronic) (peripheral) 11/01/2019 No Yes I89.0 Lymphedema, not elsewhere classified 12/27/2019 No Yes Inactive Problems Resolved Problems Electronic Signature(s) Signed: 12/28/2019 5:02:22 PM By: Gretta Cool, BSN, RN, CWS, Kim RN, BSN Signed: 12/29/2019 8:13:46 AM By: Linton Ham MD Previous Signature: 12/27/2019 4:14:27 PM Version By: Linton Ham MD Entered By: Gretta Cool, BSN, RN, CWS, Kim on 12/28/2019 17:02:21 [name], [name] ([id number]) -------------------------------------------------------------------------------- Progress Note Details Patient Name: [name] Date of Service: 12/27/2019 11:00 AM Medical Record Number: [id number] Patient Account Number: [id number] Date of Birth/Sex: 1965-09-02 (54 y.o. F) Treating RN: Army Melia Primary Care Provider: Alean Rinne Other Clinician: Referring Provider: Alean Rinne Treating Provider/Extender: Tito Dine in Treatment: 8 Subjective History of Present Illness (HPI) 10/24/2018 on evaluation today patient presents for initial evaluation or clinic concerning issues that she has been having with lymphedema for quite some time. Unfortunately she has several wound openings at this point that secondary to her lymphedema/venous stasis are giving her trouble and leaking quite severely. She also has diabetes along with hypertension and stage III chronic kidney disease. Fortunately there is no signs of active infection at this time. She is going to likely require debridement of the left leg ulcer upon evaluation today just based on what I am seeing. Fortunately there is no wound opening on the right. Overall the patient seems to be doing quite well and again there is no evidence of systemic infection which is good news. No fevers, chills, nausea, vomiting, or diarrhea. 10/31/2018 on evaluation today patient actually appears to be doing excellent in regard to her lower extremity ulcers. She has been tolerating the dressing changes without complication. Fortunately there is no signs of active infection at this time. She has tolerated 3 layer compression wrap without complication. 11/07/2018 upon evaluation today patient actually appears to be doing very well with regard to her left lower extremity ulcers. She  has been tolerating the dressing changes without complication. Fortunately there is no signs of active infection. No fevers, chills, nausea, vomiting, or diarrhea. 11/21/2018 upon evaluation today patient appears to be doing quite well with regard to her lower extremity ulcers. In  fact both areas seem to be showing signs of good improvement which is excellent. She is not having as much pain as she has in the past and again has a lot of healing compared to previous visits as well. 12/05/2018 on evaluation today patient presents for follow-up concerning her ongoing issues with her bilateral lower extremity ulcers. The good news is her right lower extremity is showing signs of completely healing at this time which is great news. Fortunately there is no evidence of active infection. On the left she has just a very small area still remaining that is open at this time all in all she is very close to complete closure in my opinion. She does have compression stockings to wear at home. 12/12/2018 on evaluation today patient appears to be doing excellent in regard to her left lateral lower extremity ulcer. She has been tolerating the dressing changes without complication. Fortunately there is no signs of active infection at this time. In fact this appears to be pretty much healed at this point although again she is not 100% today. No fevers, chills, nausea, vomiting, or diarrhea. 12/19/2018 on evaluation today patient actually appears to be doing excellent with regard to her lower extremity ulcer in fact this appears to be completely healed today which is all some. She has done extremely well with wound care measures. No fevers, chills, nausea, vomiting, or diarrhea. ------------------------------------ 11/01/19-Readmission to the clinic Patient presents with left leg pain, wounds posterior calf, onset about 4 weeks, denies any fevers chills or shakes, has not been using anything to these wounds, was given  compression stockings at last discharge from the clinic but has not been able to use them as they rolled down and cause creases Patient's history significant for type 2 diabetes insulin requiring, A1c of 6.9 lately, hypertension, chronic pain 8/18; patient readmitted that the clinic last week. She has wounds on her left lateral posterior lower leg all of this in close juxtaposition i.e. a localized site. We've been using silver alginate under 3 layer compression apparently the wound surface area is better. She was wearing compression stockings from elastic therapy but says they were falling down. As she progresses more towards healing will need to address what we use in terms of compression stockings perhaps external compression garments 11/17/2019 on evaluation today patient appears to be doing well with regard to her legs currently. She does have 2 wounds which are actually measuring much better the more posterior is actually doing very well which I am pleased with the other though smaller is not quite a small but nonetheless on the lateral aspect does seem to be improving. 11/23/2019 on evaluation today patient appears to be doing well in regard to her wounds. In fact the wound on the posterior aspect of her leg appears healed the lateral aspect is still open but extremely small. 9/15; this is a patient with chronic venous insufficiency and secondary lymphedema. She did not keep her clinic appointment last week and hence the left leg is a lot more swollen since she had to take the wrap off at some point. She has a small weeping area on the left lateral lower leg. She has probably a juxta lite stocking for the left leg I have asked her to bring that in when she comes to her clinic appointment next week at which time she should be healed 10/6; this is a patient with chronic venous insufficiency and secondary lymphedema left greater than right. Skin changes of chronic lymphedema  especially in the left  leg. She has not been here in 3 weeks. She took the wrap off 2 weeks ago. She has some very old 20/30 stockings from elastic therapy in [place] she has been using. Fortunately her leg is closed. She also has a single Farrow wrap for the left leg Muro, [name] ([id number]) Objective Constitutional Sitting or standing Blood Pressure is within target range for patient.. Pulse regular and within target range for patient.Marland Kitchen Respirations regular, non- labored and within target range.. Temperature is normal and within the target range for the patient.Marland Kitchen appears in no distress. Vitals Time Taken: 11:15 AM, Height: 67 in, Weight: 317 lbs, BMI: 49.6, Temperature: 98.5 F, Pulse: 98 bpm, Respiratory Rate: 18 breaths/min, Blood Pressure: 136/83 mmHg. Cardiovascular Nonpitting edema of the left greater than right leg. General Notes: Wound exam; the patient has no open wound on the left lateral calf this is completely closed. This is fortunate since she still has significant the edema in the left leg. Her skin is very dry fissured and hyperkeratotic. She has no evidence of an acute process DVT or significant PAD. The edema in the left leg is considerably more than the right Integumentary (Hair, Skin) Wound #4 status is Open. Original cause of wound was Gradually Appeared. The wound is located on the Left,Lateral Lower Leg. The wound measures 0cm length x 0cm width x 0cm depth; 0cm^2 area and 0cm^3 volume. There is Fat Layer (Subcutaneous Tissue) exposed. There is a medium amount of serosanguineous drainage noted. The wound margin is flat and intact. There is small (1-33%) red granulation within the wound bed. There is a small (1-33%) amount of necrotic tissue within the wound bed including Adherent Slough. Assessment Active Problems ICD-10 Non-pressure chronic ulcer of left calf limited to breakdown of skin Venous insufficiency (chronic) (peripheral) Lymphedema, not elsewhere classified Plan Discharge  From [facility] Services: Discharge from [place] - Treatment Complete 1. The patient can be discharged from the wound care center 2. Her compliance with visits here was marginal at best however fortunately her leg is closed out on the left lateral. 3. She is going to use her Farrow wrap on the left leg, our staff showed her how to do this 4. She was advised to get new elastic therapy stockings for the right leg 5. Very significant skin lubrication. She has Radio producer) Signed: 12/28/2019 5:03:56 PM By: Gretta Cool, BSN, RN, CWS, Kim RN, BSN Signed: 12/29/2019 8:13:46 AM By: Linton Ham MD Previous Signature: 12/27/2019 4:14:27 PM Version By: Linton Ham MD Entered By: Gretta Cool BSN, RN, CWS, Kim on 12/28/2019 17:03:56 [name], [name] ([id number]) -------------------------------------------------------------------------------- [place] Details Patient Name: [name] Date of Service: 12/27/2019 Medical Record Number: [id number] Patient Account Number: [id number] Date of Birth/Sex: [birth year] (54 y.o. F) Treating RN: Army Melia Primary Care Provider: Alean Rinne Other Clinician: Referring Provider: Alean Rinne Treating Provider/Extender: Tito Dine in Treatment: 8 Diagnosis Coding ICD-10 Codes Code Description [phone] Non-pressure chronic ulcer of left calf limited to breakdown of skin I87.2 Venous insufficiency (chronic) (peripheral) I89.0 Lymphedema, not elsewhere classified Facility Procedures CPT4 Code: [id number] Description: 99213 - WOUND CARE VISIT-LEV 3 EST PT Modifier: Quantity: 1 Physician Procedures CPT4 Code: 8185631 Description: 49702 - WC PHYS LEVEL 3 - EST PT Modifier: Quantity: 1 CPT4 Code: Description: ICD-10 Diagnosis Description L97.221 Non-pressure chronic ulcer of left calf limited to breakdown of skin I87.2 Venous insufficiency (chronic) (peripheral) I89.0 Lymphedema, not elsewhere  classified Modifier: Quantity: Electronic Signature(s) Signed: 12/28/2019 5:04:06 PM By:  Gretta Cool, BSN, RN, [employer], Leisure centre manager, BSN Signed: 12/29/2019 8:13:46 AM By: Linton Ham MD Previous Signature: 12/28/2019 5:01:05 PM Version By: Gretta Cool BSN, RN, CWS, Kim RN, BSN Previous Signature: 12/27/2019 4:14:27 PM Version By: Linton Ham MD Entered By: Gretta Cool, BSN, RN, CWS, Kim on 12/28/2019 17:04:06

## 2020-01-10 ENCOUNTER — Ambulatory Visit: Payer: Medicare Other | Admitting: Internal Medicine

## 2020-01-11 ENCOUNTER — Ambulatory Visit: Payer: Medicare Other | Admitting: Physician Assistant

## 2020-01-12 NOTE — Progress Notes (Signed)
[name], [name] ([id number]) Visit Report for 12/06/2019 HPI Details Patient Name: [name], [name] Date of Service: 12/06/2019 9:00 AM Medical Record Number: [id number] Patient Account Number: [id number] Date of Birth/Sex: 11-14-1965 (54 y.o. F) Treating RN: Cornell Barman Primary Care Provider: Alean Rinne Other Clinician: Referring Provider: Alean Rinne Treating Provider/Extender: Tito Dine in Treatment: 5 History of Present Illness HPI Description: 10/24/2018 on evaluation today patient presents for initial evaluation or clinic concerning issues that she has been having with lymphedema for quite some time. Unfortunately she has several wound openings at this point that secondary to her lymphedema/venous stasis are giving her trouble and leaking quite severely. She also has diabetes along with hypertension and stage III chronic kidney disease. Fortunately there is no signs of active infection at this time. She is going to likely require debridement of the left leg ulcer upon evaluation today just based on what I am seeing. Fortunately there is no wound opening on the right. Overall the patient seems to be doing quite well and again there is no evidence of systemic infection which is good news. No fevers, chills, nausea, vomiting, or diarrhea. 10/31/2018 on evaluation today patient actually appears to be doing excellent in regard to her lower extremity ulcers. She has been tolerating the dressing changes without complication. Fortunately there is no signs of active infection at this time. She has tolerated 3 layer compression wrap without complication. 11/07/2018 upon evaluation today patient actually appears to be doing very well with regard to her left lower extremity ulcers. She has been tolerating the dressing changes without complication. Fortunately there is no signs of active infection. No fevers, chills, nausea, vomiting, or diarrhea. 11/21/2018 upon evaluation today patient  appears to be doing quite well with regard to her lower extremity ulcers. In fact both areas seem to be showing signs of good improvement which is excellent. She is not having as much pain as she has in the past and again has a lot of healing compared to previous visits as well. 12/05/2018 on evaluation today patient presents for follow-up concerning her ongoing issues with her bilateral lower extremity ulcers. The good news is her right lower extremity is showing signs of completely healing at this time which is great news. Fortunately there is no evidence of active infection. On the left she has just a very small area still remaining that is open at this time all in all she is very close to complete closure in my opinion. She does have compression stockings to wear at home. 12/12/2018 on evaluation today patient appears to be doing excellent in regard to her left lateral lower extremity ulcer. She has been tolerating the dressing changes without complication. Fortunately there is no signs of active infection at this time. In fact this appears to be pretty much healed at this point although again she is not 100% today. No fevers, chills, nausea, vomiting, or diarrhea. 12/19/2018 on evaluation today patient actually appears to be doing excellent with regard to her lower extremity ulcer in fact this appears to be completely healed today which is all some. She has done extremely well with wound care measures. No fevers, chills, nausea, vomiting, or diarrhea. ------------------------------------ 11/01/19-Readmission to the clinic Patient presents with left leg pain, wounds posterior calf, onset about 4 weeks, denies any fevers chills or shakes, has not been using anything to these wounds, was given compression stockings at last discharge from the clinic but has not been able to use them as they rolled down and  cause creases Patient's history significant for type 2 diabetes insulin requiring, A1c of 6.9  lately, hypertension, chronic pain 8/18; patient readmitted that the clinic last week. She has wounds on her left lateral posterior lower leg all of this in close juxtaposition i.e. a localized site. We've been using silver alginate under 3 layer compression apparently the wound surface area is better. She was wearing compression stockings from elastic therapy but says they were falling down. As she progresses more towards healing will need to address what we use in terms of compression stockings perhaps external compression garments 11/17/2019 on evaluation today patient appears to be doing well with regard to her legs currently. She does have 2 wounds which are actually measuring much better the more posterior is actually doing very well which I am pleased with the other though smaller is not quite a small but nonetheless on the lateral aspect does seem to be improving. 11/23/2019 on evaluation today patient appears to be doing well in regard to her wounds. In fact the wound on the posterior aspect of her leg appears healed the lateral aspect is still open but extremely small. 9/15; this is a patient with chronic venous insufficiency and secondary lymphedema. She did not keep her clinic appointment last week and hence the left leg is a lot more swollen since she had to take the wrap off at some point. She has a small weeping area on the left lateral lower leg. She has probably a juxta lite stocking for the left leg I have asked her to bring that in when she comes to her clinic appointment next week at which time she should be healed Electronic Signature(s) Signed: 12/07/2019 4:37:14 PM By: Linton Ham MD Entered By: Linton Ham on 12/06/2019 09:22:12 Ridener, Grafton ([id number]) BRITTANNY, LEVENHAGEN ([id number]) -------------------------------------------------------------------------------- Physical Exam Details Patient Name: [name] Date of Service: 12/06/2019 9:00 AM Medical Record  Number: [id number] Patient Account Number: [id number] Date of Birth/Sex: September 21, 1965 (54 y.o. F) Treating RN: Cornell Barman Primary Care Provider: Alean Rinne Other Clinician: Referring Provider: Alean Rinne Treating Provider/Extender: Tito Dine in Treatment: 5 Constitutional Patient is hypertensive.. Pulse regular and within target range for patient.Marland Kitchen Respirations regular, non-labored and within target range.. Temperature is normal and within the target range for the patient.Marland Kitchen appears in no distress. Cardiovascular paplable on the left. Notes Wound exam; small weeping area on the left lateral lower calf there is not fully closed. She has a lot of edema in the left leg which is nonpitting. 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 There is no evidence of a DVT or arterial insufficiency on the left Electronic Signature(s) Signed: 12/07/2019 4:37:14 PM By: Linton Ham MD Entered By: Linton Ham on 12/06/2019 09:24:04 Golob, [name] ([id number]) -------------------------------------------------------------------------------- Physician Orders Details Patient Name: [name] Date of Service: 12/06/2019 9:00 AM Medical Record Number: [id number] Patient Account Number: [id number] Date of Birth/Sex: 04-19-1965 (54 y.o. F) Treating RN: Cornell Barman Primary Care Provider: Alean Rinne Other Clinician: Referring Provider: Alean Rinne Treating Provider/Extender: Tito Dine in Treatment: 5 Verbal / Phone Orders: No Diagnosis Coding Wound Cleansing Wound #4 Left,Lateral Lower Leg o May shower with protection. Skin Barriers/Peri-Wound Care Wound #4 Left,Lateral Lower Leg o Moisturizing lotion Primary Wound Dressing Wound #4 Left,Lateral Lower Leg o Silver Alginate Secondary Dressing Wound #4 Left,Lateral Lower Leg o ABD pad Dressing Change Frequency Wound #4 Left,Lateral Lower Leg o Change dressing every week Follow-up  Appointments Wound #4 Left,Lateral Lower Leg o Return Appointment  in 1 week. Edema Control Wound #4 Left,Lateral Lower Leg o 3 Layer Compression System - Left Lower Extremity o Elevate legs to the level of the heart and pump ankles as often as possible Electronic Signature(s) Signed: 12/07/2019 4:37:14 PM By: Linton Ham MD Signed: 01/11/2020 6:08:34 PM By: Gretta Cool, BSN, RN, CWS, Kim RN, BSN Entered By: Gretta Cool, BSN, RN, CWS, Kim on 12/06/2019 09:15:03 Highbaugh, [name] ([id number]) -------------------------------------------------------------------------------- Problem List Details Patient Name: [name] Date of Service: 12/06/2019 9:00 AM Medical Record Number: [id number] Patient Account Number: [id number] Date of Birth/Sex: [birth year] (55 y.o. F) Treating RN: Cornell Barman Primary Care Provider: Alean Rinne Other Clinician: Referring Provider: Alean Rinne Treating Provider/Extender: Tito Dine in Treatment: 5 Active Problems ICD-10 Encounter Code Description Active Date MDM Diagnosis L97.221 Non-pressure chronic ulcer of left calf limited to breakdown of skin 11/01/2019 No Yes I87.2 Venous insufficiency (chronic) (peripheral) 11/01/2019 No Yes Inactive Problems Resolved Problems Electronic Signature(s) Signed: 12/07/2019 4:37:14 PM By: Linton Ham MD Entered By: Linton Ham on 12/06/2019 09:19:32 Rinck, [name] ([id number]) -------------------------------------------------------------------------------- Progress Note Details Patient Name: [name] Date of Service: 12/06/2019 9:00 AM Medical Record Number: [id number] Patient Account Number: [id number] Date of Birth/Sex: [birth year] (54 y.o. F) Treating RN: Cornell Barman Primary Care Provider: Alean Rinne Other Clinician: Referring Provider: Alean Rinne Treating Provider/Extender: Tito Dine in Treatment: 5 Subjective History of Present Illness (HPI) 10/24/2018 on evaluation today  patient presents for initial evaluation or clinic concerning issues that she has been having with lymphedema for quite some time. Unfortunately she has several wound openings at this point that secondary to her lymphedema/venous stasis are giving her trouble and leaking quite severely. She also has diabetes along with hypertension and stage III chronic kidney disease. Fortunately there is no signs of active infection at this time. She is going to likely require debridement of the left leg ulcer upon evaluation today just based on what I am seeing. Fortunately there is no wound opening on the right. Overall the patient seems to be doing quite well and again there is no evidence of systemic infection which is good news. No fevers, chills, nausea, vomiting, or diarrhea. 10/31/2018 on evaluation today patient actually appears to be doing excellent in regard to her lower extremity ulcers. She has been tolerating the dressing changes without complication. Fortunately there is no signs of active infection at this time. She has tolerated 3 layer compression wrap without complication. 11/07/2018 upon evaluation today patient actually appears to be doing very well with regard to her left lower extremity ulcers. She has been tolerating the dressing changes without complication. Fortunately there is no signs of active infection. No fevers, chills, nausea, vomiting, or diarrhea. 11/21/2018 upon evaluation today patient appears to be doing quite well with regard to her lower extremity ulcers. In fact both areas seem to be showing signs of good improvement which is excellent. She is not having as much pain as she has in the past and again has a lot of healing compared to previous visits as well. 12/05/2018 on evaluation today patient presents for follow-up concerning her ongoing issues with her bilateral lower extremity ulcers. The good news is her right lower extremity is showing signs of completely healing at this  time which is great news. Fortunately there is no evidence of active infection. On the left she has just a very small area still remaining that is open at this time all in all she is very close to complete closure in my opinion.  She does have compression stockings to wear at home. 12/12/2018 on evaluation today patient appears to be doing excellent in regard to her left lateral lower extremity ulcer. She has been tolerating the dressing changes without complication. Fortunately there is no signs of active infection at this time. In fact this appears to be pretty much healed at this point although again she is not 100% today. No fevers, chills, nausea, vomiting, or diarrhea. 12/19/2018 on evaluation today patient actually appears to be doing excellent with regard to her lower extremity ulcer in fact this appears to be completely healed today which is all some. She has done extremely well with wound care measures. No fevers, chills, nausea, vomiting, or diarrhea. ------------------------------------ 11/01/19-Readmission to the clinic Patient presents with left leg pain, wounds posterior calf, onset about 4 weeks, denies any fevers chills or shakes, has not been using anything to these wounds, was given compression stockings at last discharge from the clinic but has not been able to use them as they rolled down and cause creases Patient's history significant for type 2 diabetes insulin requiring, A1c of 6.9 lately, hypertension, chronic pain 8/18; patient readmitted that the clinic last week. She has wounds on her left lateral posterior lower leg all of this in close juxtaposition i.e. a localized site. We've been using silver alginate under 3 layer compression apparently the wound surface area is better. She was wearing compression stockings from elastic therapy but says they were falling down. As she progresses more towards healing will need to address what we use in terms of compression stockings  perhaps external compression garments 11/17/2019 on evaluation today patient appears to be doing well with regard to her legs currently. She does have 2 wounds which are actually measuring much better the more posterior is actually doing very well which I am pleased with the other though smaller is not quite a small but nonetheless on the lateral aspect does seem to be improving. 11/23/2019 on evaluation today patient appears to be doing well in regard to her wounds. In fact the wound on the posterior aspect of her leg appears healed the lateral aspect is still open but extremely small. 9/15; this is a patient with chronic venous insufficiency and secondary lymphedema. She did not keep her clinic appointment last week and hence the left leg is a lot more swollen since she had to take the wrap off at some point. She has a small weeping area on the left lateral lower leg. She has probably a juxta lite stocking for the left leg I have asked her to bring that in when she comes to her clinic appointment next week at which time she should be healed Objective Livas, Laquanda ([id number]) Constitutional Patient is hypertensive.. Pulse regular and within target range for patient.Marland Kitchen Respirations regular, non-labored and within target range.. Temperature is normal and within the target range for the patient.Marland Kitchen appears in no distress. Vitals Time Taken: 8:55 AM, Height: 67 in, Weight: 317 lbs, BMI: 49.6, Temperature: 98.2 F, Pulse: 77 bpm, Respiratory Rate: 18 breaths/min, Blood Pressure: 159/69 mmHg. Cardiovascular paplable on the left. General Notes: Wound exam; small weeping area on the left lateral lower calf there is not fully closed. She has a lot of edema in the left leg which is nonpitting. 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 There is no evidence of a DVT or arterial insufficiency on the left Integumentary (Hair, Skin) Wound #4 status is Open. Original cause of wound was  Gradually Appeared. The  wound is located on the Left,Lateral Lower Leg. The wound measures 0.4cm length x 0.4cm width x 0.1cm depth; 0.126cm^2 area and 0.013cm^3 volume. There is Fat Layer (Subcutaneous Tissue) exposed. There is a medium amount of serosanguineous drainage noted. The wound margin is flat and intact. There is small (1-33%) red granulation within the wound bed. There is a small (1-33%) amount of necrotic tissue within the wound bed including Adherent Slough. Assessment Active Problems ICD-10 Non-pressure chronic ulcer of left calf limited to breakdown of skin Venous insufficiency (chronic) (peripheral) Procedures Wound #4 Pre-procedure diagnosis of Wound #4 is a Venous Leg Ulcer located on the Left,Lateral Lower Leg . There was a Three Layer Compression Therapy Procedure with a pre-treatment ABI of 1 by Cornell Barman, RN. Post procedure Diagnosis Wound #4: Same as Pre-Procedure Plan Wound Cleansing: Wound #4 Left,Lateral Lower Leg: May shower with protection. Skin Barriers/Peri-Wound Care: Wound #4 Left,Lateral Lower Leg: Moisturizing lotion Primary Wound Dressing: Wound #4 Left,Lateral Lower Leg: Silver Alginate Secondary Dressing: Wound #4 Left,Lateral Lower Leg: ABD pad Dressing Change Frequency: Wound #4 Left,Lateral Lower Leg: Change dressing every week Follow-up Appointments: Wound #4 Left,Lateral Lower Leg: Return Appointment in 1 week. Edema Control: Wound #4 Left,Lateral Lower Leg: 3 Layer Compression System - Left Lower Extremity Elevate legs to the level of the heart and pump ankles as often as possible Hey, Sheli ([id number]) 1. Silver alginate to the left lateral lower leg ABDs under 3 layer compression 2. She should be closed by next week and we should be able to transition her into an external compression stocking. 3. I do not believe she has a stocking for the right leg but I did not get into this discussion with her. Electronic  Signature(s) Signed: 12/07/2019 4:37:14 PM By: Linton Ham MD Entered By: Linton Ham on 12/06/2019 09:25:07 Borjon, [name] ([id number]) -------------------------------------------------------------------------------- SuperBill Details Patient Name: [name] Date of Service: 12/06/2019 Medical Record Number: [id number] Patient Account Number: [id number] Date of Birth/Sex: [birth year] (54 y.o. F) Treating RN: Cornell Barman Primary Care Provider: Alean Rinne Other Clinician: Referring Provider: Alean Rinne Treating Provider/Extender: Tito Dine in Treatment: 5 Diagnosis Coding ICD-10 Codes Code Description [phone] Non-pressure chronic ulcer of left calf limited to breakdown of skin I87.2 Venous insufficiency (chronic) (peripheral) Facility Procedures CPT4 Code: [id number] Description: (Facility Use Only) [phone] - [facility] LWR LT LEG Modifier: Quantity: 1 Physician Procedures CPT4 Code: [id number] Description: 42706 - WC PHYS LEVEL 3 - EST PT Modifier: Quantity: 1 CPT4 Code: Description: ICD-10 Diagnosis Description L97.221 Non-pressure chronic ulcer of left calf limited to breakdown of skin I87.2 Venous insufficiency (chronic) (peripheral) Modifier: Quantity: Electronic Signature(s) Signed: 12/07/2019 4:37:14 PM By: Linton Ham MD Entered By: Linton Ham on 12/06/2019 09:25:27

## 2020-01-24 ENCOUNTER — Ambulatory Visit: Payer: Medicare Other | Admitting: Internal Medicine

## 2020-02-09 ENCOUNTER — Encounter (HOSPITAL_COMMUNITY): Payer: Medicare Other | Admitting: Cardiology

## 2020-02-23 ENCOUNTER — Telehealth (HOSPITAL_COMMUNITY): Payer: Self-pay

## 2020-02-23 NOTE — Telephone Encounter (Signed)
Pt called and reported that she had a heart attack 11/11 and went to a hospital in [place]. She had an appointment with Dr. Aundra Dubin in February but said that was too far out. Appt moved up to 12/8.

## 2020-02-28 ENCOUNTER — Encounter (HOSPITAL_COMMUNITY): Payer: Medicare Other | Admitting: Cardiology

## 2020-03-26 ENCOUNTER — Other Ambulatory Visit (HOSPITAL_COMMUNITY): Payer: Self-pay | Admitting: Cardiology

## 2020-03-26 DIAGNOSIS — I5022 Chronic systolic (congestive) heart failure: Secondary | ICD-10-CM

## 2020-04-12 ENCOUNTER — Other Ambulatory Visit (HOSPITAL_COMMUNITY): Payer: Self-pay | Admitting: *Deleted

## 2020-04-12 DIAGNOSIS — I5022 Chronic systolic (congestive) heart failure: Secondary | ICD-10-CM

## 2020-04-12 MED ORDER — TORSEMIDE 20 MG PO TABS: 20.0000 mg | ORAL_TABLET | Freq: Two times a day (BID) | ORAL | 3 refills | Status: DC

## 2020-04-12 MED ORDER — TORSEMIDE 20 MG PO TABS: ORAL_TABLET | ORAL | 3 refills | Status: DC

## 2020-04-23 ENCOUNTER — Encounter (HOSPITAL_COMMUNITY): Payer: Medicare Other | Admitting: Cardiology

## 2020-04-25 ENCOUNTER — Encounter (HOSPITAL_COMMUNITY): Payer: Medicare Other | Admitting: Cardiology

## 2020-06-18 ENCOUNTER — Encounter: Payer: Medicare Other | Attending: Physician Assistant | Admitting: Physician Assistant

## 2020-06-18 ENCOUNTER — Other Ambulatory Visit: Payer: Self-pay

## 2020-06-18 DIAGNOSIS — E11622 Type 2 diabetes mellitus with other skin ulcer: Secondary | ICD-10-CM | POA: Insufficient documentation

## 2020-06-18 DIAGNOSIS — Z809 Family history of malignant neoplasm, unspecified: Secondary | ICD-10-CM | POA: Insufficient documentation

## 2020-06-18 DIAGNOSIS — Z91040 Latex allergy status: Secondary | ICD-10-CM | POA: Insufficient documentation

## 2020-06-18 DIAGNOSIS — I872 Venous insufficiency (chronic) (peripheral): Secondary | ICD-10-CM | POA: Diagnosis not present

## 2020-06-18 DIAGNOSIS — E1122 Type 2 diabetes mellitus with diabetic chronic kidney disease: Secondary | ICD-10-CM | POA: Diagnosis not present

## 2020-06-18 DIAGNOSIS — Z8249 Family history of ischemic heart disease and other diseases of the circulatory system: Secondary | ICD-10-CM | POA: Insufficient documentation

## 2020-06-18 DIAGNOSIS — Z794 Long term (current) use of insulin: Secondary | ICD-10-CM | POA: Insufficient documentation

## 2020-06-18 DIAGNOSIS — I5042 Chronic combined systolic (congestive) and diastolic (congestive) heart failure: Secondary | ICD-10-CM | POA: Diagnosis not present

## 2020-06-18 DIAGNOSIS — I130 Hypertensive heart and chronic kidney disease with heart failure and stage 1 through stage 4 chronic kidney disease, or unspecified chronic kidney disease: Secondary | ICD-10-CM | POA: Diagnosis not present

## 2020-06-18 DIAGNOSIS — Z888 Allergy status to other drugs, medicaments and biological substances status: Secondary | ICD-10-CM | POA: Insufficient documentation

## 2020-06-18 DIAGNOSIS — L97811 Non-pressure chronic ulcer of other part of right lower leg limited to breakdown of skin: Secondary | ICD-10-CM | POA: Insufficient documentation

## 2020-06-18 DIAGNOSIS — N183 Chronic kidney disease, stage 3 unspecified: Secondary | ICD-10-CM | POA: Diagnosis not present

## 2020-06-18 DIAGNOSIS — I890 Lymphedema, not elsewhere classified: Secondary | ICD-10-CM | POA: Insufficient documentation

## 2020-06-18 DIAGNOSIS — Z881 Allergy status to other antibiotic agents status: Secondary | ICD-10-CM | POA: Insufficient documentation

## 2020-06-18 DIAGNOSIS — Z886 Allergy status to analgesic agent status: Secondary | ICD-10-CM | POA: Insufficient documentation

## 2020-06-18 NOTE — Progress Notes (Addendum)
[name] ([id number]) Visit Report for 06/18/2020 Chief Complaint Document Details Patient Name: [name], [name] Date of Service: 06/18/2020 8:30 AM Medical Record Number: [id number] Patient Account Number: [id number] Date of Birth/Sex: 1965-05-18 (55 y.o. F) Treating RN: Dolan Amen Primary Care Provider: PATIENT, NO Other Clinician: Jeanine Luz Referring Provider: Referral, Self Treating Provider/Extender: Skipper Cliche in Treatment: 0 Information Obtained from: Patient Chief Complaint Right LE Ulcer Electronic Signature(s) Signed: 06/18/2020 9:16:21 AM By: Worthy Keeler PA-C Entered By: Worthy Keeler on 06/18/2020 09:16:21 [name] ([id number]) -------------------------------------------------------------------------------- HPI Details Patient Name: [name] Date of Service: 06/18/2020 8:30 AM Medical Record Number: [id number] Patient Account Number: [id number] Date of Birth/Sex: 1966-01-09 (55 y.o. F) Treating RN: Dolan Amen Primary Care Provider: PATIENT, NO Other Clinician: Jeanine Luz Referring Provider: Referral, Self Treating Provider/Extender: Skipper Cliche in Treatment: 0 History of Present Illness HPI Description: 10/24/2018 on evaluation today patient presents for initial evaluation or clinic concerning issues that she has been having with lymphedema for quite some time. Unfortunately she has several wound openings at this point that secondary to her lymphedema/venous stasis are giving her trouble and leaking quite severely. She also has diabetes along with hypertension and stage III chronic kidney disease. Fortunately there is no signs of active infection at this time. She is going to likely require debridement of the left leg ulcer upon evaluation today just based on what I am seeing. Fortunately there is no wound opening on the right. Overall the patient seems to be doing quite well and again there is no evidence of systemic  infection which is good news. No fevers, chills, nausea, vomiting, or diarrhea. 10/31/2018 on evaluation today patient actually appears to be doing excellent in regard to her lower extremity ulcers. She has been tolerating the dressing changes without complication. Fortunately there is no signs of active infection at this time. She has tolerated 3 layer compression wrap without complication. 11/07/2018 upon evaluation today patient actually appears to be doing very well with regard to her left lower extremity ulcers. She has been tolerating the dressing changes without complication. Fortunately there is no signs of active infection. No fevers, chills, nausea, vomiting, or diarrhea. 11/21/2018 upon evaluation today patient appears to be doing quite well with regard to her lower extremity ulcers. In fact both areas seem to be showing signs of good improvement which is excellent. She is not having as much pain as she has in the past and again has a lot of healing compared to previous visits as well. 12/05/2018 on evaluation today patient presents for follow-up concerning her ongoing issues with her bilateral lower extremity ulcers. The good news is her right lower extremity is showing signs of completely healing at this time which is great news. Fortunately there is no evidence of active infection. On the left she has just a very small area still remaining that is open at this time all in all she is very close to complete closure in my opinion. She does have compression stockings to wear at home. 12/12/2018 on evaluation today patient appears to be doing excellent in regard to her left lateral lower extremity ulcer. She has been tolerating the dressing changes without complication. Fortunately there is no signs of active infection at this time. In fact this appears to be pretty much healed at this point although again she is not 100% today. No fevers, chills, nausea, vomiting, or diarrhea. 12/19/2018 on  evaluation today patient actually appears to be doing excellent with regard to  her lower extremity ulcer in fact this appears to be completely healed today which is all some. She has done extremely well with wound care measures. No fevers, chills, nausea, vomiting, or diarrhea. ------------------------------------ 11/01/19-Readmission to the clinic Patient presents with left leg pain, wounds posterior calf, onset about 4 weeks, denies any fevers chills or shakes, has not been using anything to these wounds, was given compression stockings at last discharge from the clinic but has not been able to use them as they rolled down and cause creases Patient's history significant for type 2 diabetes insulin requiring, A1c of 6.9 lately, hypertension, chronic pain 8/18; patient readmitted that the clinic last week. She has wounds on her left lateral posterior lower leg all of this in close juxtaposition i.e. a localized site. We've been using silver alginate under 3 layer compression apparently the wound surface area is better. She was wearing compression stockings from elastic therapy but says they were falling down. As she progresses more towards healing will need to address what we use in terms of compression stockings perhaps external compression garments 11/17/2019 on evaluation today patient appears to be doing well with regard to her legs currently. She does have 2 wounds which are actually measuring much better the more posterior is actually doing very well which I am pleased with the other though smaller is not quite a small but nonetheless on the lateral aspect does seem to be improving. 11/23/2019 on evaluation today patient appears to be doing well in regard to her wounds. In fact the wound on the posterior aspect of her leg appears healed the lateral aspect is still open but extremely small. 9/15; this is a patient with chronic venous insufficiency and secondary lymphedema. She did not keep her  clinic appointment last week and hence the left leg is a lot more swollen since she had to take the wrap off at some point. She has a small weeping area on the left lateral lower leg. She has probably a juxta lite stocking for the left leg I have asked her to bring that in when she comes to her clinic appointment next week at which time she should be healed 10/6; this is a patient with chronic venous insufficiency and secondary lymphedema left greater than right. Skin changes of chronic lymphedema especially in the left leg. She has not been here in 3 weeks. She took the wrap off 2 weeks ago. She has some very old 20/30 stockings from elastic therapy in [place] she has been using. Fortunately her leg is closed. She also has a single Farrow wrap for the left leg Readmission: 06/18/2020 upon evaluation today patient presents for reevaluation here in the clinic. She is a long-term patient that we have seen intermittently when she has had flareups of issues with her lower extremity secondary to chronic venous stasis. With that being said unfortunately today she is having a significant issue with a wound which is actually quite significant over the right lower extremity. She did see dermatology they put Vaseline followed by T[place]a [place] dressings on her unfortunately she is continuing to have significant issues however here with pain and in fact this wrap was extremely stuck to her upon evaluation today. Nonetheless I believe that she is going to require a little bit different approach to try to keep things from sticking and hopefully aid in getting this to heal more effectively and quickly. With that being said she does have a history of [name], [name] ([id number]) chronic venous insufficiency, lymphedema,  diabetes mellitus type 2, chronic kidney disease stage III, hypertension, and congestive heart failure. This started as an injury in mid February and has worsened over the past 2 weeks. Electronic  Signature(s) Signed: 06/18/2020 7:56:54 PM By: Worthy Keeler PA-C Entered By: Worthy Keeler on 06/18/2020 19:56:54 [name], [name] ([id number]) -------------------------------------------------------------------------------- Physical Exam Details Patient Name: [name] Date of Service: 06/18/2020 8:30 AM Medical Record Number: [id number] Patient Account Number: [id number] Date of Birth/Sex: 1965-11-13 (55 y.o. F) Treating RN: Dolan Amen Primary Care Provider: PATIENT, NO Other Clinician: Jeanine Luz Referring Provider: Referral, Self Treating Provider/Extender: Skipper Cliche in Treatment: 0 Constitutional patient is hypertensive.. pulse regular and within target range for patient.Marland Kitchen respirations regular, non-labored and within target range for patient.Marland Kitchen temperature within target range for patient.. Well-nourished and well-hydrated in no acute distress. Eyes conjunctiva clear no eyelid edema noted. pupils equal round and reactive to light and accommodation. Ears, Nose, Mouth, and Throat no gross abnormality of ear auricles or external auditory canals. normal hearing noted during conversation. mucus membranes moist. Respiratory normal breathing without difficulty. Cardiovascular 2+ dorsalis pedis/posterior tibialis pulses. 2+ pitting edema of the bilateral lower extremities. Musculoskeletal normal gait and posture. no significant deformity or arthritic changes, no loss or range of motion, no clubbing. Psychiatric this patient is able to make decisions and demonstrates good insight into disease process. Alert and Oriented x 3. pleasant and cooperative. Notes Upon inspection patient's wounds currently showed signs unfortunately of doing not too well with regard to the overall appearance and size. She has actually a fairly confluent area which is quite large over the right lower extremity that extends from the anterior to the lateral and even partially posterior region.  Overall this is extremely painful for her unfortunately and I am concerned about anything we put on sticking as obviously that is can be very uncomfortable. I think probably Lauraine Rinne is her best option. To try to help both with the edema as well as helping out with absorbing the drainage without anything sticking to her wound bed. No sharp debridement was necessary today. Electronic Signature(s) Signed: 06/18/2020 7:57:41 PM By: Worthy Keeler PA-C Entered By: Worthy Keeler on 06/18/2020 19:57:41 [name], [name] ([id number]) -------------------------------------------------------------------------------- Physician Orders Details Patient Name: [name] Date of Service: 06/18/2020 8:30 AM Medical Record Number: [id number] Patient Account Number: [id number] Date of Birth/Sex: [birth year] (55 y.o. F) Treating RN: Dolan Amen Primary Care Provider: PATIENT, NO Other Clinician: Jeanine Luz Referring Provider: Referral, Self Treating Provider/Extender: Skipper Cliche in Treatment: 0 Verbal / Phone Orders: No Diagnosis Coding ICD-10 Coding Code Description I87.2 Venous insufficiency (chronic) (peripheral) I89.0 Lymphedema, not elsewhere classified L97.811 Non-pressure chronic ulcer of other part of right lower leg limited to breakdown of skin E11.622 Type 2 diabetes mellitus with other skin ulcer N18.30 Chronic kidney disease, stage 3 unspecified I10 Essential (primary) hypertension I50.42 Chronic combined systolic (congestive) and diastolic (congestive) heart failure Follow-up Appointments o Return Appointment in 1 week. o Nurse Visit as needed - Friday Bathing/ Shower/ Hygiene Wound #6 Right,Lateral Lower Leg o May shower with wound dressing protected with water repellent cover or cast protector. Edema Control - Lymphedema / Segmental Compressive Device / Other o Optional: One layer of unna paste to top of compression wrap (to act as an anchor). o 3 Layer  Compression System for Lymphedema. Wound Treatment Wound #6 - Lower Leg Wound Laterality: Right, Lateral Cleanser: Soap and Water 1 x Per Week/30 Days Discharge Instructions: Gently cleanse wound with antibacterial soap,  rinse and pat dry prior to dressing wounds Primary Dressing: Xtrasorb Classic Super Absorbent Dressing, 6x9 (in/in) 1 x Per Week/30 Days Primary Dressing: Curad Oil Emulsion Dressing 3x3 (in/in) 1 x Per Week/30 Days Discharge Instructions: apply on areas that are still exposed after xtrasorb applied Secondary Dressing: ABD Pad 5x9 (in/in) 1 x Per Week/30 Days Discharge Instructions: Cover oil emulsion with ABD pad Compression Wrap: Profore Lite LF 3 Multilayer Compression Bandaging System 1 x Per Week/30 Days Discharge Instructions: Apply 3 multi-layer wrap as prescribed. Electronic Signature(s) Signed: 06/18/2020 2:16:03 PM By: Worthy Keeler PA-C Signed: 06/18/2020 5:07:41 PM By: Georges Mouse, Minus Breeding RN Entered By: Georges Mouse, Minus Breeding on 06/18/2020 09:45:58 [name], [name] ([id number]) -------------------------------------------------------------------------------- Problem List Details Patient Name: [name] Date of Service: 06/18/2020 8:30 AM Medical Record Number: [id number] Patient Account Number: [id number] Date of Birth/Sex: [birth year] (55 y.o. F) Treating RN: Dolan Amen Primary Care Provider: PATIENT, NO Other Clinician: Jeanine Luz Referring Provider: Referral, Self Treating Provider/Extender: Skipper Cliche in Treatment: 0 Active Problems ICD-10 Encounter Code Description Active Date MDM Diagnosis I87.2 Venous insufficiency (chronic) (peripheral) 06/18/2020 No Yes I89.0 Lymphedema, not elsewhere classified 06/18/2020 No Yes L97.811 Non-pressure chronic ulcer of other part of right lower leg limited to 06/18/2020 No Yes breakdown of skin E11.622 Type 2 diabetes mellitus with other skin ulcer 06/18/2020 No Yes N18.30 Chronic kidney disease,  stage 3 unspecified 06/18/2020 No Yes I10 Essential (primary) hypertension 06/18/2020 No Yes I50.42 Chronic combined systolic (congestive) and diastolic (congestive) heart 06/18/2020 No Yes failure Inactive Problems Resolved Problems Electronic Signature(s) Signed: 06/18/2020 9:36:43 AM By: Worthy Keeler PA-C Previous Signature: 06/18/2020 9:16:09 AM Version By: Worthy Keeler PA-C Entered By: Worthy Keeler on 06/18/2020 09:36:42 [name], [name] ([id number]) -------------------------------------------------------------------------------- Progress Note Details Patient Name: [name] Date of Service: 06/18/2020 8:30 AM Medical Record Number: [id number] Patient Account Number: [id number] Date of Birth/Sex: 02-19-1966 (55 y.o. F) Treating RN: Dolan Amen Primary Care Provider: PATIENT, NO Other Clinician: Jeanine Luz Referring Provider: Referral, Self Treating Provider/Extender: Skipper Cliche in Treatment: 0 Subjective Chief Complaint Information obtained from Patient Right LE Ulcer History of Present Illness (HPI) 10/24/2018 on evaluation today patient presents for initial evaluation or clinic concerning issues that she has been having with lymphedema for quite some time. Unfortunately she has several wound openings at this point that secondary to her lymphedema/venous stasis are giving her trouble and leaking quite severely. She also has diabetes along with hypertension and stage III chronic kidney disease. Fortunately there is no signs of active infection at this time. She is going to likely require debridement of the left leg ulcer upon evaluation today just based on what I am seeing. Fortunately there is no wound opening on the right. Overall the patient seems to be doing quite well and again there is no evidence of systemic infection which is good news. No fevers, chills, nausea, vomiting, or diarrhea. 10/31/2018 on evaluation today patient actually appears to be doing  excellent in regard to her lower extremity ulcers. She has been tolerating the dressing changes without complication. Fortunately there is no signs of active infection at this time. She has tolerated 3 layer compression wrap without complication. 11/07/2018 upon evaluation today patient actually appears to be doing very well with regard to her left lower extremity ulcers. She has been tolerating the dressing changes without complication. Fortunately there is no signs of active infection. No fevers, chills, nausea, vomiting, or diarrhea. 11/21/2018 upon evaluation today patient appears to be doing  quite well with regard to her lower extremity ulcers. In fact both areas seem to be showing signs of good improvement which is excellent. She is not having as much pain as she has in the past and again has a lot of healing compared to previous visits as well. 12/05/2018 on evaluation today patient presents for follow-up concerning her ongoing issues with her bilateral lower extremity ulcers. The good news is her right lower extremity is showing signs of completely healing at this time which is great news. Fortunately there is no evidence of active infection. On the left she has just a very small area still remaining that is open at this time all in all she is very close to complete closure in my opinion. She does have compression stockings to wear at home. 12/12/2018 on evaluation today patient appears to be doing excellent in regard to her left lateral lower extremity ulcer. She has been tolerating the dressing changes without complication. Fortunately there is no signs of active infection at this time. In fact this appears to be pretty much healed at this point although again she is not 100% today. No fevers, chills, nausea, vomiting, or diarrhea. 12/19/2018 on evaluation today patient actually appears to be doing excellent with regard to her lower extremity ulcer in fact this appears to be completely healed  today which is all some. She has done extremely well with wound care measures. No fevers, chills, nausea, vomiting, or diarrhea. ------------------------------------ 11/01/19-Readmission to the clinic Patient presents with left leg pain, wounds posterior calf, onset about 4 weeks, denies any fevers chills or shakes, has not been using anything to these wounds, was given compression stockings at last discharge from the clinic but has not been able to use them as they rolled down and cause creases Patient's history significant for type 2 diabetes insulin requiring, A1c of 6.9 lately, hypertension, chronic pain 8/18; patient readmitted that the clinic last week. She has wounds on her left lateral posterior lower leg all of this in close juxtaposition i.e. a localized site. We've been using silver alginate under 3 layer compression apparently the wound surface area is better. She was wearing compression stockings from elastic therapy but says they were falling down. As she progresses more towards healing will need to address what we use in terms of compression stockings perhaps external compression garments 11/17/2019 on evaluation today patient appears to be doing well with regard to her legs currently. She does have 2 wounds which are actually measuring much better the more posterior is actually doing very well which I am pleased with the other though smaller is not quite a small but nonetheless on the lateral aspect does seem to be improving. 11/23/2019 on evaluation today patient appears to be doing well in regard to her wounds. In fact the wound on the posterior aspect of her leg appears healed the lateral aspect is still open but extremely small. 9/15; this is a patient with chronic venous insufficiency and secondary lymphedema. She did not keep her clinic appointment last week and hence the left leg is a lot more swollen since she had to take the wrap off at some point. She has a small weeping area  on the left lateral lower leg. She has probably a juxta lite stocking for the left leg I have asked her to bring that in when she comes to her clinic appointment next week at which time she should be healed 10/6; this is a patient with chronic venous insufficiency and  secondary lymphedema left greater than right. Skin changes of chronic lymphedema especially in the left leg. She has not been here in 3 weeks. She took the wrap off 2 weeks ago. She has some very old 20/30 stockings from elastic therapy in [place] she has been using. Fortunately her leg is closed. She also has a single Farrow wrap for the left leg Readmission: 06/18/2020 upon evaluation today patient presents for reevaluation here in the clinic. She is a long-term patient that we have seen intermittently [name], [name] ([id number]) when she has had flareups of issues with her lower extremity secondary to chronic venous stasis. With that being said unfortunately today she is having a significant issue with a wound which is actually quite significant over the right lower extremity. She did see dermatology they put Vaseline followed by [place] dressings on her unfortunately she is continuing to have significant issues however here with pain and in fact this wrap was extremely stuck to her upon evaluation today. Nonetheless I believe that she is going to require a little bit different approach to try to keep things from sticking and hopefully aid in getting this to heal more effectively and quickly. With that being said she does have a history of chronic venous insufficiency, lymphedema, diabetes mellitus type 2, chronic kidney disease stage III, hypertension, and congestive heart failure. This started as an injury in mid February and has worsened over the past 2 weeks. Patient History Information obtained from Patient. Allergies Tylenol PM (Reaction: hives), diazepam, latex (Reaction: itching), simvastatin (Reaction: joint pain),  ciprofloxacin (Reaction: anxiety), levofloxacin (Reaction: anxiety), Omnicef (Reaction: anxiety) Family History Cancer - Father,Siblings, Diabetes - Mother,Siblings, Heart Disease - Mother,Maternal Grandparents, Hypertension - Mother,Maternal Grandparents, No family history of Hereditary Spherocytosis, Kidney Disease, Lung Disease, Seizures, Stroke, Thyroid Problems, Tuberculosis. Social History Never smoker, Marital Status - Divorced, Alcohol Use - Never, Drug Use - No History, Caffeine Use - Never. Medical History Eyes Denies history of Cataracts, Glaucoma, Optic Neuritis Ear/Nose/Mouth/Throat Denies history of Chronic sinus problems/congestion, Middle ear problems Hematologic/Lymphatic Denies history of Anemia, Hemophilia, Human Immunodeficiency Virus, Lymphedema, Sickle Cell Disease Respiratory Denies history of Aspiration, Asthma, Chronic Obstructive Pulmonary Disease (COPD), Pneumothorax, Sleep Apnea, Tuberculosis Cardiovascular Patient has history of Congestive Heart Failure, Coronary Artery Disease - CABG 2010, Hypertension, Myocardial Infarction - November 2021, Peripheral Venous Disease Denies history of Angina, Arrhythmia, Deep Vein Thrombosis, Hypotension, Peripheral Arterial Disease, Phlebitis, Vasculitis Gastrointestinal Denies history of Cirrhosis , Colitis, Crohn s, Hepatitis A, Hepatitis B, Hepatitis C Endocrine Patient has history of Type II Diabetes Denies history of Type I Diabetes Genitourinary Patient has history of 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 Immunological Denies history of Lupus Erythematosus, Raynaud s, Scleroderma Integumentary (Skin) Denies history of History of Burn, History of pressure wounds Musculoskeletal Denies history of Gout, Rheumatoid Arthritis, Osteoarthritis, Osteomyelitis Neurologic Patient has history of Neuropathy Denies history of Dementia, Quadriplegia, Paraplegia, Seizure Disorder Oncologic Denies history of Received  Chemotherapy, Received Radiation Psychiatric Denies history of Anorexia/bulimia, Confinement Anxiety Hospitalization/Surgery History - c section. - hernia. - cabg. Medical And Surgical History Notes Cardiovascular HLD Objective Constitutional [name], [name] ([id number]) patient is hypertensive.. pulse regular and within target range for patient.Marland Kitchen respirations regular, non-labored and within target range for patient.Marland Kitchen temperature within target range for patient.. Well-nourished and well-hydrated in no acute distress. Vitals Time Taken: 9:13 AM, Height: 67 in, Weight: 315 lbs, BMI: 49.3, Temperature: 98.3 F, Pulse: 85 bpm, Respiratory Rate: 16 breaths/min, Blood Pressure: 168/83 mmHg. Eyes conjunctiva clear  no eyelid edema noted. pupils equal round and reactive to light and accommodation. Ears, Nose, Mouth, and Throat no gross abnormality of ear auricles or external auditory canals. normal hearing noted during conversation. mucus membranes moist. Respiratory normal breathing without difficulty. Cardiovascular 2+ dorsalis pedis/posterior tibialis pulses. 2+ pitting edema of the bilateral lower extremities. Musculoskeletal normal gait and posture. no significant deformity or arthritic changes, no loss or range of motion, no clubbing. Psychiatric this patient is able to make decisions and demonstrates good insight into disease process. Alert and Oriented x 3. pleasant and cooperative. General Notes: Upon inspection patient's wounds currently showed signs unfortunately of doing not too well with regard to the overall appearance and size. She has actually a fairly confluent area which is quite large over the right lower extremity that extends from the anterior to the lateral and even partially posterior region. Overall this is extremely painful for her unfortunately and I am concerned about anything we put on sticking as obviously that is can be very uncomfortable. I think probably Lauraine Rinne  is her best option. To try to help both with the edema as well as helping out with absorbing the drainage without anything sticking to her wound bed. No sharp debridement was necessary today. Integumentary (Hair, Skin) Wound #6 status is Open. Original cause of wound was Trauma. The date acquired was: 05/07/2020. The wound is located on the Right,Lateral Lower Leg. The wound measures 18cm length x 20cm width x 0.1cm depth; 282.743cm^2 area and 28.274cm^3 volume. There is Fat Layer (Subcutaneous Tissue) exposed. There is no tunneling or undermining noted. There is a large amount of serosanguineous drainage noted. The wound margin is flat and intact. There is no granulation within the wound bed. There is no necrotic tissue within the wound bed. 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 There was a Three Layer Compression Therapy Procedure by Dolan Amen, RN. Post procedure Diagnosis Wound #6: Same as Pre-Procedure Plan Follow-up Appointments: Return Appointment in 1 week. [name], [name] ([id number]) Nurse Visit as needed - Friday Bathing/ Shower/ Hygiene: Wound #6 Right,Lateral Lower Leg: May shower with wound dressing protected with water repellent cover or cast protector. Edema Control - Lymphedema / Segmental Compressive Device / Other: Optional: One layer of unna paste to top of compression wrap (to act as an anchor). 3 Layer Compression System for Lymphedema. WOUND #6: - Lower Leg Wound Laterality: Right,  Lateral Cleanser: Soap and Water 1 x Per Week/30 Days Discharge Instructions: Gently cleanse wound with antibacterial soap, rinse and pat dry prior to dressing wounds Primary Dressing: Xtrasorb Classic Super Absorbent Dressing, 6x9 (in/in) 1 x Per Week/30 Days Primary Dressing: Curad Oil Emulsion Dressing 3x3 (in/in) 1 x Per Week/30 Days Discharge Instructions: apply on areas that are still exposed after xtrasorb applied Secondary Dressing: ABD Pad 5x9 (in/in) 1 x Per Week/30 Days Discharge Instructions: Cover oil emulsion with ABD pad Compression Wrap: Profore Lite LF 3 Multilayer Compression Bandaging System 1 x Per Week/30 Days Discharge Instructions: Apply 3 multi-layer wrap as prescribed. 1. Would recommend currently that we initiate treatment with XtraSorb I think no primary dressing otherwise is  good to be necessary currently the main thing we need to do is monitor and prevent any excessive fluid drainage and buildup in the wrap. 2. I also can recommend at this time that we have the patient going continue with the wound care measures as before that have always been helpful for her with regard to compression wraps. We will get a be using a 3 layer compression wrap. 3. Muscle can recommend that we have the patient continue to elevate her legs much as possible to try to keep edema under control. I think this is still of utmost importance. We will see patient back for reevaluation in 1 week here in the clinic. If anything worsens or changes patient will contact our office for additional recommendations. Electronic Signature(s) Signed: 06/18/2020 7:58:39 PM By: Worthy Keeler PA-C Entered By: Worthy Keeler on 06/18/2020 19:58:38 [name], [name] ([id number]) -------------------------------------------------------------------------------- ROS/PFSH Details Patient Name: [name] Date of Service: 06/18/2020 8:30 AM Medical Record Number: [id number] Patient Account Number:  [id number] Date of Birth/Sex: Oct 19, 1965 (55 y.o. F) Treating RN: Cornell Barman Primary Care Provider: PATIENT, NO Other Clinician: Jeanine Luz Referring Provider: Referral, Self Treating Provider/Extender: Skipper Cliche in Treatment: 0 Information Obtained From Patient Eyes Medical History: Negative for: Cataracts; Glaucoma; Optic Neuritis Ear/Nose/Mouth/Throat Medical History: Negative for: Chronic sinus problems/congestion; Middle ear problems Hematologic/Lymphatic Medical History: Negative for: Anemia; Hemophilia; Human Immunodeficiency Virus; Lymphedema; Sickle Cell Disease Respiratory Medical History: Negative for: Aspiration; Asthma; Chronic Obstructive Pulmonary Disease (COPD); Pneumothorax; Sleep Apnea; Tuberculosis Cardiovascular Medical History: Positive for: Congestive Heart Failure; Coronary Artery Disease - CABG 2010; Hypertension; Myocardial Infarction - November 2021; Peripheral Venous Disease Negative for: Angina; Arrhythmia; Deep Vein Thrombosis; Hypotension; Peripheral Arterial Disease; Phlebitis; Vasculitis Past Medical History Notes: HLD Gastrointestinal Medical History: Negative for: Cirrhosis ; Colitis; Crohnos; Hepatitis A; Hepatitis B; Hepatitis C Endocrine Medical History: Positive for: Type II Diabetes Negative for: Type I Diabetes Time with diabetes: 1997 Treated with: Insulin Blood sugar tested every day: Yes Tested : 3 x daily Genitourinary Medical History: Positive for: 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 Immunological Medical History: Negative for: Lupus Erythematosus; Raynaudos; Scleroderma Integumentary (Skin) [name], [name] ([id number]) Medical History: Negative for: History of Burn; History of pressure wounds Musculoskeletal Medical History: Negative for: Gout; Rheumatoid Arthritis; Osteoarthritis; Osteomyelitis Neurologic Medical History: Positive for: Neuropathy Negative for: Dementia; Quadriplegia; Paraplegia; Seizure  Disorder Oncologic Medical History: Negative for: Received Chemotherapy; Received Radiation Psychiatric Medical History: Negative for: Anorexia/bulimia; Confinement Anxiety Immunizations Pneumococcal Vaccine: Received Pneumococcal Vaccination: Yes Implantable Devices None Hospitalization / Surgery History Type of Hospitalization/Surgery c section hernia cabg Family and Social History Cancer: Yes - Father,Siblings; Diabetes: Yes - Mother,Siblings; Heart Disease: Yes - Mother,Maternal Grandparents; Hereditary Spherocytosis: No; Hypertension: Yes - Mother,Maternal Grandparents; Kidney Disease: No; Lung Disease: No; Seizures: No; Stroke: No; Thyroid Problems: No; Tuberculosis: No; Never smoker; Marital Status - Divorced; Alcohol Use: Never; Drug Use: No History; Caffeine Use: Never; Financial Concerns: No; Food, Clothing or Shelter Needs: No; Support System Lacking: No; Transportation Concerns: No Electronic Signature(s) Signed: 06/18/2020 2:16:03 PM By: Worthy Keeler PA-C Signed: 06/18/2020 6:10:08 PM By: Gretta Cool, BSN, RN, CWS, Kim RN, BSN Entered By: Gretta Cool, BSN, RN, CWS, Kim on 06/18/2020 09:26:05 [name], [name] ([id number]) -------------------------------------------------------------------------------- [facility] Details Patient Name: [name] Date of Service: 06/18/2020 Medical Record Number: [id number] Patient Account Number: [id number] Date of Birth/Sex: 1965/08/26 (55 y.o. F) Treating RN: Dolan Amen Primary Care Provider: PATIENT, NO Other Clinician: Jeanine Luz Referring Provider: Referral, Self Treating Provider/Extender:  Jeri Cos Weeks in Treatment: 0 Diagnosis Coding ICD-10 Codes Code Description I87.2 Venous insufficiency (chronic) (peripheral) I89.0 Lymphedema, not elsewhere classified L97.811 Non-pressure chronic ulcer of other part of right lower leg limited to breakdown of skin E11.622 Type 2 diabetes mellitus with other skin ulcer N18.30 Chronic  kidney disease, stage 3 unspecified I10 Essential (primary) hypertension I50.42 Chronic combined systolic (congestive) and diastolic (congestive) heart failure Facility Procedures CPT4 Code: 66294765 Description: 99213 - WOUND CARE VISIT-LEV 3 EST PT Modifier: Quantity: 1 CPT4 Code: 46503546 Description: (Facility Use Only) 56812XN - APPLY MULTLAY COMPRS LWR RT LEG Modifier: Quantity: 1 Physician Procedures CPT4 Code: 1700174 Description: 94496 - WC PHYS LEVEL 3 - EST PT Modifier: Quantity: 1 CPT4 Code: Description: ICD-10 Diagnosis Description I87.2 Venous insufficiency (chronic) (peripheral) I89.0 Lymphedema, not elsewhere classified L97.811 Non-pressure chronic ulcer of other part of right lower leg limited to bre E11.622 Type 2 diabetes mellitus  with other skin ulcer Modifier: akdown of skin Quantity: Electronic Signature(s) Signed: 06/18/2020 7:58:51 PM By: Worthy Keeler PA-C Previous Signature: 06/18/2020 2:16:03 PM Version By: Worthy Keeler PA-C Previous Signature: 06/18/2020 5:07:41 PM Version By: Georges Mouse, Minus Breeding RN Entered By: Worthy Keeler on 06/18/2020 19:58:51

## 2020-06-18 NOTE — Progress Notes (Signed)
VONZELLA, ALTHAUS ([id number]) Visit Report for 06/18/2020 Abuse/Suicide Risk Screen Details Patient Name: [name], [name] Date of Service: 06/18/2020 8:30 AM Medical Record Number: [id number] Patient Account Number: [id number] Date of Birth/Sex: [birth year] (55 y.o. F) Treating RN: Cornell Barman Primary Care Burnham Trost: PATIENT, NO Other Clinician: Jeanine Luz Referring Aldridge Krzyzanowski: Referral, Self Treating Brittiany Wiehe/Extender: Skipper Cliche in Treatment: 0 Abuse/Suicide Risk Screen Items Answer ABUSE RISK SCREEN: Has anyone close to you tried to hurt or harm you recentlyo No Do you feel uncomfortable with anyone in your familyo No Has anyone forced you do things that you didnot want to doo No Electronic Signature(s) Signed: 06/18/2020 6:10:08 PM By: Gretta Cool, BSN, RN, CWS, Kim RN, BSN Entered By: Gretta Cool, BSN, RN, CWS, Kim on 06/18/2020 09:26:16 Haertel, [name] ([id number]) -------------------------------------------------------------------------------- Activities of Daily Living Details Patient Name: [name] Date of Service: 06/18/2020 8:30 AM Medical Record Number: [id number] Patient Account Number: [id number] Date of Birth/Sex: Jun 20, 1965 (55 y.o. F) Treating RN: Cornell Barman Primary Care Sajad Glander: PATIENT, NO Other Clinician: Jeanine Luz Referring Norleen Xie: Referral, Self Treating Torrence Hammack/Extender: Skipper Cliche in Treatment: 0 Activities of Daily Living Items Answer Activities of Daily Living (Please select one for each item) Drive Automobile Completely Able Take Medications Completely Able Use Telephone Completely Able Care for Appearance Completely Able Use Toilet Completely Able Bath / Shower Completely Able Dress Self Completely Able Feed Self Completely Able Walk Completely Able Get In / Out Bed Completely Able Housework Completely Able Prepare Meals Completely Able Handle Money Completely Able Shop for Self Completely Able Electronic Signature(s) Signed:  06/18/2020 6:10:08 PM By: Gretta Cool, BSN, RN, CWS, Kim RN, BSN Entered By: Gretta Cool, BSN, RN, CWS, Kim on 06/18/2020 09:26:31 Ruud, [name] ([id number]) -------------------------------------------------------------------------------- Education Screening Details Patient Name: [name] Date of Service: 06/18/2020 8:30 AM Medical Record Number: [id number] Patient Account Number: [id number] Date of Birth/Sex: 1966/02/21 (54 y.o. F) Treating RN: Cornell Barman Primary Care Azalee Weimer: PATIENT, NO Other Clinician: Jeanine Luz Referring Timber Marshman: Referral, Self Treating Tavarious Freel/Extender: Skipper Cliche in Treatment: 0 Primary Learner Assessed: Patient Learning Preferences/Education Level/Primary Language Learning Preference: Explanation Highest Education Level: High School Preferred Language: English Cognitive Barrier Language Barrier: No Translator Needed: No Memory Deficit: No Emotional Barrier: No Cultural/Religious Beliefs Affecting Medical Care: No Physical Barrier Impaired Vision: Yes Glasses Impaired Hearing: No Decreased Hand dexterity: No Knowledge/Comprehension Knowledge Level: High Comprehension Level: High Ability to understand written instructions: High Ability to understand verbal instructions: High Motivation Anxiety Level: Calm Cooperation: Cooperative Education Importance: Acknowledges Need Interest in Health Problems: Asks Questions Perception: Coherent Willingness to Engage in Self-Management High Activities: Readiness to Engage in Self-Management High Activities: Electronic Signature(s) Signed: 06/18/2020 6:10:08 PM By: Gretta Cool, BSN, RN, CWS, Kim RN, BSN Entered By: Gretta Cool, BSN, RN, CWS, Kim on 06/18/2020 09:27:06 Tissue, [name] ([id number]) -------------------------------------------------------------------------------- Fall Risk Assessment Details Patient Name: [name] Date of Service: 06/18/2020 8:30 AM Medical Record Number: [id number] Patient  Account Number: [id number] Date of Birth/Sex: 1965-12-05 (55 y.o. F) Treating RN: Cornell Barman Primary Care Judi Jaffe: PATIENT, NO Other Clinician: Jeanine Luz Referring Antwian Santaana: Referral, Self Treating Biviana Saddler/Extender: Skipper Cliche in Treatment: 0 Fall Risk Assessment Items Have you had 2 or more falls in the last 12 monthso 0 Yes Have you had any fall that resulted in injury in the last 12 monthso 0 No FALLS RISK SCREEN History of falling - immediate or within 3 months 0 No Secondary diagnosis (Do you have 2 or more medical diagnoseso) 0 No Ambulatory aid None/bed rest/wheelchair/nurse 0 No Crutches/cane/walker 15  Yes Furniture 0 No Intravenous therapy Access/Saline/Heparin Lock 0 No Gait/Transferring Normal/ bed rest/ wheelchair 0 Yes Weak (short steps with or without shuffle, stooped but able to lift head while walking, may 0 No seek support from furniture) Impaired (short steps with shuffle, may have difficulty arising from chair, head down, impaired 0 No balance) Mental Status Oriented to own ability 0 Yes Electronic Signature(s) Signed: 06/18/2020 6:10:08 PM By: Gretta Cool, BSN, RN, CWS, Kim RN, BSN Entered By: Gretta Cool, BSN, RN, CWS, Kim on 06/18/2020 09:27:37 Skolnik, [name] ([id number]) -------------------------------------------------------------------------------- Foot Assessment Details Patient Name: [name] Date of Service: 06/18/2020 8:30 AM Medical Record Number: [id number] Patient Account Number: [id number] Date of Birth/Sex: 10-27-1965 (54 y.o. F) Treating RN: Cornell Barman Primary Care Maxx Calaway: PATIENT, NO Other Clinician: Jeanine Luz Referring Rashika Bettes: Referral, Self Treating Phelicia Dantes/Extender: Skipper Cliche in Treatment: 0 Foot Assessment Items Site Locations + = Sensation present, - = Sensation absent, C = Callus, U = Ulcer R = Redness, W = Warmth, M = Maceration, PU = Pre-ulcerative lesion F = Fissure, S = Swelling, D =  Dryness Assessment Right: Left: Other Deformity: No No Prior Foot Ulcer: No No Prior Amputation: No No Charcot Joint: No No Ambulatory Status: Ambulatory With Help Assistance Device: Cane GaitEnergy manager) Signed: 06/18/2020 6:10:08 PM By: Gretta Cool, BSN, RN, CWS, Kim RN, BSN Entered By: Gretta Cool, BSN, RN, CWS, Kim on 06/18/2020 09:29:49 Mahon, [name] ([id number]) -------------------------------------------------------------------------------- Nutrition Risk Screening Details Patient Name: [name] Date of Service: 06/18/2020 8:30 AM Medical Record Number: [id number] Patient Account Number: [id number] Date of Birth/Sex: 08/23/1965 (55 y.o. F) Treating RN: Cornell Barman Primary Care Solon Alban: PATIENT, NO Other Clinician: Jeanine Luz Referring Easter Schinke: Referral, Self Treating Emmalynn Pinkham/Extender: Skipper Cliche in Treatment: 0 Height (in): 67 Weight (lbs): 315 Body Mass Index (BMI): 49.3 Nutrition Risk Screening Items Score Screening NUTRITION RISK SCREEN: I have an illness or condition that made me change the kind and/or amount of food I eat 0 No I eat fewer than two meals per day 0 No I eat few fruits and vegetables, or milk products 0 No I have three or more drinks of beer, liquor or wine almost every day 0 No I have tooth or mouth problems that make it hard for me to eat 0 No I don't always have enough money to buy the food I need 0 No I eat alone most of the time 0 No I take three or more different prescribed or over-the-counter drugs a day 1 Yes Without wanting to, I have lost or gained 10 pounds in the last six months 0 No I am not always physically able to shop, cook and/or feed myself 0 No Nutrition Protocols Good Risk Protocol Moderate Risk Protocol 0 Provide education on nutrition High Risk Proctocol Risk Level: Good Risk Score: 1 Electronic Signature(s) Signed: 06/18/2020 6:10:08 PM By: Gretta Cool, BSN, RN, CWS, Kim RN, BSN Entered By: Gretta Cool,  BSN, RN, CWS, Kim on 06/18/2020 09:27:56

## 2020-06-21 ENCOUNTER — Other Ambulatory Visit: Payer: Self-pay

## 2020-06-21 ENCOUNTER — Encounter: Payer: Medicare Other | Attending: Physician Assistant

## 2020-06-21 DIAGNOSIS — L97811 Non-pressure chronic ulcer of other part of right lower leg limited to breakdown of skin: Secondary | ICD-10-CM | POA: Insufficient documentation

## 2020-06-21 DIAGNOSIS — I890 Lymphedema, not elsewhere classified: Secondary | ICD-10-CM | POA: Diagnosis not present

## 2020-06-21 DIAGNOSIS — E11622 Type 2 diabetes mellitus with other skin ulcer: Secondary | ICD-10-CM | POA: Diagnosis not present

## 2020-06-21 DIAGNOSIS — I872 Venous insufficiency (chronic) (peripheral): Secondary | ICD-10-CM | POA: Insufficient documentation

## 2020-06-21 DIAGNOSIS — E1122 Type 2 diabetes mellitus with diabetic chronic kidney disease: Secondary | ICD-10-CM | POA: Insufficient documentation

## 2020-06-21 DIAGNOSIS — Z794 Long term (current) use of insulin: Secondary | ICD-10-CM | POA: Diagnosis not present

## 2020-06-21 DIAGNOSIS — I5042 Chronic combined systolic (congestive) and diastolic (congestive) heart failure: Secondary | ICD-10-CM | POA: Insufficient documentation

## 2020-06-21 DIAGNOSIS — N183 Chronic kidney disease, stage 3 unspecified: Secondary | ICD-10-CM | POA: Diagnosis not present

## 2020-06-21 DIAGNOSIS — G8929 Other chronic pain: Secondary | ICD-10-CM | POA: Diagnosis not present

## 2020-06-21 DIAGNOSIS — I130 Hypertensive heart and chronic kidney disease with heart failure and stage 1 through stage 4 chronic kidney disease, or unspecified chronic kidney disease: Secondary | ICD-10-CM | POA: Insufficient documentation

## 2020-06-21 NOTE — Progress Notes (Signed)
[facility] ([id number]) Visit Report for 06/21/2020 Arrival Information Details Patient Name: [name], [name] Date of Service: 06/21/2020 12:30 PM Medical Record Number: [id number] Patient Account Number: [id number] Date of Birth/Sex: 06-29-1965 (55 y.o. F) Treating RN: Dolan Amen Primary Care Shantel Wesely: PATIENT, NO Other Clinician: Jeanine Luz Referring Gustavia Carie: Referral, Self Treating Nicloe Frontera/Extender: Skipper Cliche in Treatment: 0 Visit Information History Since Last Visit Has Compression in Place as Prescribed: Yes Patient Arrived: Ambulatory Pain Present Now: Yes Arrival Time: 12:58 Accompanied By: daughter Transfer Assistance: None Patient Identification Verified: Yes Secondary Verification Process Completed: Yes Electronic Signature(s) Signed: 06/21/2020 3:53:39 PM By: Georges Mouse, Minus Breeding RN Entered By: Georges Mouse, Minus Breeding on 06/21/2020 12:59:37 [name] ([id number]) -------------------------------------------------------------------------------- Clinic Level of Care Assessment Details Patient Name: [name] Date of Service: 06/21/2020 12:30 PM Medical Record Number: [id number] Patient Account Number: [id number] Date of Birth/Sex: 1965/04/26 (55 y.o. F) Treating RN: Dolan Amen Primary Care Mykelle Cockerell: PATIENT, NO Other Clinician: Jeanine Luz Referring Evaline Waltman: Referral, Self Treating Noelia Lenart/Extender: Skipper Cliche in Treatment: 0 Clinic Level of Care Assessment Items TOOL 1 Quantity Score []  - Use when EandM and Procedure is performed on INITIAL visit 0 ASSESSMENTS - Nursing Assessment / Reassessment []  - General Physical Exam (combine w/ comprehensive assessment (listed just below) when performed on new 0 pt. evals) []  - 0 Comprehensive Assessment (HX, ROS, Risk Assessments, Wounds Hx, etc.) ASSESSMENTS - Wound and Skin Assessment / Reassessment []  - Dermatologic / Skin Assessment (not related to wound area) 0 ASSESSMENTS -  Ostomy and/or Continence Assessment and Care []  - Incontinence Assessment and Management 0 []  - 0 Ostomy Care Assessment and Management (repouching, etc.) PROCESS - Coordination of Care []  - Simple Patient / Family Education for ongoing care 0 []  - 0 Complex (extensive) Patient / Family Education for ongoing care []  - 0 Staff obtains Programmer, systems, Records, Test Results / Process Orders []  - 0 Staff telephones HHA, Nursing Homes / Clarify orders / etc []  - 0 Routine Transfer to another Facility (non-emergent condition) []  - 0 Routine Hospital Admission (non-emergent condition) []  - 0 New Admissions / Biomedical engineer / Ordering NPWT, Apligraf, etc. []  - 0 Emergency Hospital Admission (emergent condition) PROCESS - Special Needs []  - Pediatric / Minor Patient Management 0 []  - 0 Isolation Patient Management []  - 0 Hearing / Language / Visual special needs []  - 0 Assessment of Community assistance (transportation, D/C planning, etc.) []  - 0 Additional assistance / Altered mentation []  - 0 Support Surface(s) Assessment (bed, cushion, seat, etc.) INTERVENTIONS - Miscellaneous []  - External ear exam 0 []  - 0 Patient Transfer (multiple staff / Civil Service fast streamer / Similar devices) []  - 0 Simple Staple / Suture removal (25 or less) []  - 0 Complex Staple / Suture removal (26 or more) []  - 0 Hypo/Hyperglycemic Management (do not check if billed separately) []  - 0 Ankle / Brachial Index (ABI) - do not check if billed separately Has the patient been seen at the hospital within the last three years: Yes Total Score: 0 Level Of Care: ____ [name] ([id number]) Electronic Signature(s) Signed: 06/21/2020 3:53:39 PM By: Georges Mouse, Minus Breeding RN Entered By: Georges Mouse, Minus Breeding on 06/21/2020 13:15:12 [name] ([id number]) -------------------------------------------------------------------------------- Compression Therapy Details Patient Name: [name] Date of  Service: 06/21/2020 12:30 PM Medical Record Number: [id number] Patient Account Number: [id number] Date of Birth/Sex: April 21, 1965 (55 y.o. F) Treating RN: Dolan Amen Primary Care Genessis Flanary: PATIENT, NO Other Clinician: Jeanine Luz Referring Tegan Britain: Referral, Self Treating Pavneet Markwood/Extender: Skipper Cliche  in Treatment: 0 Compression Therapy Performed for Wound Assessment: Wound #6 Right,Lateral Lower Leg Performed By: Cora Daniels, RN Compression Type: Three Layer Notes patient tolerating wrap well Electronic Signature(s) Signed: 06/21/2020 3:53:39 PM By: Georges Mouse, Minus Breeding RN Entered By: Georges Mouse, Minus Breeding on 06/21/2020 13:11:12 [name] ([id number]) -------------------------------------------------------------------------------- Encounter Discharge Information Details Patient Name: [name] Date of Service: 06/21/2020 12:30 PM Medical Record Number: [id number] Patient Account Number: [id number] Date of Birth/Sex: 1965-11-25 (55 y.o. F) Treating RN: Dolan Amen Primary Care Rody Keadle: PATIENT, NO Other Clinician: Jeanine Luz Referring Alece Koppel: Referral, Self Treating Aireana Ryland/Extender: Skipper Cliche in Treatment: 0 Encounter Discharge Information Items Discharge Condition: Stable Ambulatory Status: Ambulatory Discharge Destination: Home Transportation: Private Auto Accompanied By: daughter Schedule Follow-up Appointment: Yes Clinical Summary of Care: Electronic Signature(s) Signed: 06/21/2020 3:53:39 PM By: Georges Mouse, Minus Breeding RN Entered By: Georges Mouse, Minus Breeding on 06/21/2020 13:11:55 [name] ([id number]) -------------------------------------------------------------------------------- Wound Assessment Details Patient Name: [name] Date of Service: 06/21/2020 12:30 PM Medical Record Number: [id number] Patient Account Number: [id number] Date of Birth/Sex: 1965-04-16 (55 y.o. F) Treating RN: Dolan Amen Primary  Care Vere Diantonio: PATIENT, NO Other Clinician: Jeanine Luz Referring Pachia Strum: Referral, Self Treating Kedrick Mcnamee/Extender: Skipper Cliche in Treatment: 0 Wound Status Wound Number: 6 Primary Trauma, Other Etiology: Wound Location: Right, Lateral Lower Leg Wound Open Wounding Event: Trauma Status: Date Acquired: 05/07/2020 Comorbid Congestive Heart Failure, Coronary Artery Disease, Weeks Of Treatment: 0 History: Hypertension, Myocardial Infarction, Peripheral Venous Clustered Wound: No Disease, Type II Diabetes, End Stage Renal Disease, Neuropathy Wound Measurements Length: (cm) 18 Width: (cm) 20 Depth: (cm) 0.1 Area: (cm) 282.743 Volume: (cm) 28.274 % Reduction in Area: 0% % Reduction in Volume: 0% Epithelialization: None Tunneling: No Undermining: No Wound Description Classification: Partial Thickness Wound Margin: Flat and Intact Exudate Amount: Large Exudate Type: Serosanguineous Exudate Color: red, brown Foul Odor After Cleansing: No Slough/Fibrino No Wound Bed Granulation Amount: None Present (0%) Exposed Structure Necrotic Amount: None Present (0%) Fascia Exposed: No Fat Layer (Subcutaneous Tissue) Exposed: Yes Tendon Exposed: No Muscle Exposed: No Joint Exposed: No Bone Exposed: No Treatment Notes Wound #6 (Lower Leg) Wound Laterality: Right, Lateral Cleanser Soap and Water Discharge Instruction: Gently cleanse wound with antibacterial soap, rinse and pat dry prior to dressing wounds Peri-Wound Care Topical Primary Dressing Xtrasorb Classic Super Absorbent Dressing, 6x9 (in/in) Curad Oil Emulsion Dressing 3x3 (in/in) Discharge Instruction: apply on areas that are still exposed after xtrasorb applied Secondary Dressing ABD Pad 5x9 (in/in) Discharge Instruction: Cover oil emulsion with ABD pad Secured With Yankowski, Desirai ([id number]) Compression Wrap Profore Lite LF 3 Multilayer Compression Bandaging System Discharge Instruction: Apply 3  multi-layer wrap as prescribed. Compression Stockings Add-Ons Electronic Signature(s) Signed: 06/21/2020 3:53:39 PM By: Georges Mouse, Minus Breeding RN Entered By: Georges Mouse, Minus Breeding on 06/21/2020 13:08:38

## 2020-06-22 ENCOUNTER — Other Ambulatory Visit (HOSPITAL_COMMUNITY): Payer: Self-pay | Admitting: Cardiology

## 2020-06-25 ENCOUNTER — Other Ambulatory Visit: Payer: Self-pay

## 2020-06-25 DIAGNOSIS — I872 Venous insufficiency (chronic) (peripheral): Secondary | ICD-10-CM | POA: Diagnosis not present

## 2020-06-25 NOTE — Progress Notes (Signed)
[name], [name] ([id number]) Visit Report for 06/25/2020 Arrival Information Details Patient Name: [name], [name] Date of Service: 06/25/2020 10:30 AM Medical Record Number: [id number] Patient Account Number: [id number] Date of Birth/Sex: 05/15/1965 (55 y.o. F) Treating RN: Donnamarie Poag Primary Care Sneha Willig: PATIENT, NO Other Clinician: Jeanine Luz Referring Timika Muench: Referral, Self Treating Heaton Sarin/Extender: Skipper Cliche in Treatment: 1 Visit Information History Since Last Visit Added or deleted any medications: No Patient Arrived: Ambulatory Had a fall or experienced change in No Arrival Time: 10:40 activities of daily living that may affect Accompanied By: self risk of falls: Transfer Assistance: None Hospitalized since last visit: No Patient Identification Verified: Yes Has Dressing in Place as Prescribed: No Secondary Verification Process Completed: Yes Has Compression in Place as Prescribed: No Pain Present Now: No Notes complained wrap from 4/1 was too tight and she cut the top of the wrap and left it on-did not call Electronic Signature(s) Signed: 06/25/2020 10:57:38 AM By: Donnamarie Poag Entered By: Donnamarie Poag on 06/25/2020 10:46:30 [name] ([id number]) -------------------------------------------------------------------------------- Compression Therapy Details Patient Name: [name] Date of Service: 06/25/2020 10:30 AM Medical Record Number: [id number] Patient Account Number: [id number] Date of Birth/Sex: Aug 17, 1965 (55 y.o. F) Treating RN: Donnamarie Poag Primary Care Jonnatan Hanners: PATIENT, NO Other Clinician: Jeanine Luz Referring Makye Radle: Referral, Self Treating Orva Gwaltney/Extender: Skipper Cliche in Treatment: 1 Compression Therapy Performed for Wound Assessment: Wound #6 Right,Lateral Lower Leg Performed By: Clinician Donnamarie Poag, RN Compression Type: Three Layer Electronic Signature(s) Signed: 06/25/2020 10:57:38 AM By: Donnamarie Poag Entered By:  Donnamarie Poag on 06/25/2020 10:55:52 [name] ([id number]) -------------------------------------------------------------------------------- Encounter Discharge Information Details Patient Name: [name] Date of Service: 06/25/2020 10:30 AM Medical Record Number: [id number] Patient Account Number: [id number] Date of Birth/Sex: 1965/09/25 (55 y.o. F) Treating RN: Donnamarie Poag Primary Care Tansy Lorek: PATIENT, NO Other Clinician: Jeanine Luz Referring Dashiell Franchino: Referral, Self Treating Alessia Gonsalez/Extender: Skipper Cliche in Treatment: 1 Encounter Discharge Information Items Discharge Condition: Stable Ambulatory Status: Ambulatory Discharge Destination: Home Transportation: Private Auto Accompanied By: daughter Schedule Follow-up Appointment: Yes Clinical Summary of Care: Patient Declined Electronic Signature(s) Signed: 06/25/2020 10:57:38 AM By: Donnamarie Poag Entered By: Donnamarie Poag on 06/25/2020 10:56:45 [name] ([id number]) -------------------------------------------------------------------------------- Wound Assessment Details Patient Name: [name] Date of Service: 06/25/2020 10:30 AM Medical Record Number: [id number] Patient Account Number: [id number] Date of Birth/Sex: 1965-07-11 (55 y.o. F) Treating RN: Donnamarie Poag Primary Care Laine Giovanetti: PATIENT, NO Other Clinician: Jeanine Luz Referring Elesa Garman: Referral, Self Treating Margan Elias/Extender: Skipper Cliche in Treatment: 1 Wound Status Wound Number: 6 Primary Trauma, Other Etiology: Wound Location: Right, Lateral Lower Leg Wound Open Wounding Event: Trauma Status: Date Acquired: 05/07/2020 Comorbid Congestive Heart Failure, Coronary Artery Disease, Weeks Of Treatment: 1 History: Hypertension, Myocardial Infarction, Peripheral Venous Clustered Wound: No Disease, Type II Diabetes, End Stage Renal Disease, Neuropathy Wound Measurements Length: (cm) 18 Width: (cm) 20 Depth: (cm) 0.1 Area: (cm)  282.743 Volume: (cm) 28.274 % Reduction in Area: 0% % Reduction in Volume: 0% Epithelialization: None Tunneling: No Undermining: No Wound Description Classification: Partial Thickness Wound Margin: Flat and Intact Exudate Amount: Large Exudate Type: Serosanguineous Exudate Color: red, brown Foul Odor After Cleansing: No Slough/Fibrino Yes Wound Bed Granulation Amount: Large (67-100%) Exposed Structure Granulation Quality: Red Fascia Exposed: No Necrotic Amount: Small (1-33%) Fat Layer (Subcutaneous Tissue) Exposed: Yes Necrotic Quality: Adherent Slough Tendon Exposed: No Muscle Exposed: No Joint Exposed: No Bone Exposed: No Treatment Notes Wound #6 (Lower Leg) Wound Laterality: Right, Lateral Cleanser Soap and Water Discharge Instruction: Gently cleanse wound  with antibacterial soap, rinse and pat dry prior to dressing wounds Peri-Wound Care Topical Primary Dressing Xtrasorb Classic Super Absorbent Dressing, 6x9 (in/in) Curad Oil Emulsion Dressing 3x3 (in/in) Discharge Instruction: apply on areas that are still exposed after xtrasorb applied Secondary Dressing ABD Pad 5x9 (in/in) Discharge Instruction: Cover oil emulsion with ABD pad Secured With [name] ([id number]) Compression Wrap Profore Lite LF 3 Multilayer Compression Bandaging System Discharge Instruction: Apply 3 multi-layer wrap as prescribed. Compression Stockings Add-Ons Electronic Signature(s) Signed: 06/25/2020 10:57:38 AM By: Donnamarie Poag Entered ByDonnamarie Poag on 06/25/2020 10:55:35

## 2020-06-28 ENCOUNTER — Ambulatory Visit (HOSPITAL_COMMUNITY)
Admission: RE | Admit: 2020-06-28 | Discharge: 2020-06-28 | Disposition: A | Discharge status: Home / Self Care | Payer: Medicare Other | Source: Ambulatory Visit | Attending: Cardiology | Admitting: Cardiology

## 2020-06-28 ENCOUNTER — Encounter (HOSPITAL_COMMUNITY): Payer: Self-pay | Admitting: Cardiology

## 2020-06-28 ENCOUNTER — Other Ambulatory Visit: Payer: Self-pay

## 2020-06-28 VITALS — BP 104/60 | HR 74 | Wt 314.2 lb

## 2020-06-28 DIAGNOSIS — G4733 Obstructive sleep apnea (adult) (pediatric): Secondary | ICD-10-CM | POA: Insufficient documentation

## 2020-06-28 DIAGNOSIS — I872 Venous insufficiency (chronic) (peripheral): Secondary | ICD-10-CM | POA: Insufficient documentation

## 2020-06-28 DIAGNOSIS — Z79899 Other long term (current) drug therapy: Secondary | ICD-10-CM | POA: Insufficient documentation

## 2020-06-28 DIAGNOSIS — Z7982 Long term (current) use of aspirin: Secondary | ICD-10-CM | POA: Diagnosis not present

## 2020-06-28 DIAGNOSIS — Z6841 Body Mass Index (BMI) 40.0 and over, adult: Secondary | ICD-10-CM | POA: Insufficient documentation

## 2020-06-28 DIAGNOSIS — E785 Hyperlipidemia, unspecified: Secondary | ICD-10-CM | POA: Diagnosis not present

## 2020-06-28 DIAGNOSIS — N183 Chronic kidney disease, stage 3 unspecified: Secondary | ICD-10-CM | POA: Diagnosis not present

## 2020-06-28 DIAGNOSIS — E7849 Other hyperlipidemia: Secondary | ICD-10-CM

## 2020-06-28 DIAGNOSIS — I5032 Chronic diastolic (congestive) heart failure: Secondary | ICD-10-CM | POA: Diagnosis not present

## 2020-06-28 DIAGNOSIS — I452 Bifascicular block: Secondary | ICD-10-CM | POA: Diagnosis not present

## 2020-06-28 DIAGNOSIS — E1122 Type 2 diabetes mellitus with diabetic chronic kidney disease: Secondary | ICD-10-CM | POA: Diagnosis not present

## 2020-06-28 DIAGNOSIS — Z8249 Family history of ischemic heart disease and other diseases of the circulatory system: Secondary | ICD-10-CM | POA: Diagnosis not present

## 2020-06-28 DIAGNOSIS — Z955 Presence of coronary angioplasty implant and graft: Secondary | ICD-10-CM | POA: Diagnosis not present

## 2020-06-28 DIAGNOSIS — I252 Old myocardial infarction: Secondary | ICD-10-CM | POA: Diagnosis not present

## 2020-06-28 DIAGNOSIS — I2510 Atherosclerotic heart disease of native coronary artery without angina pectoris: Secondary | ICD-10-CM | POA: Insufficient documentation

## 2020-06-28 DIAGNOSIS — I130 Hypertensive heart and chronic kidney disease with heart failure and stage 1 through stage 4 chronic kidney disease, or unspecified chronic kidney disease: Secondary | ICD-10-CM | POA: Diagnosis not present

## 2020-06-28 DIAGNOSIS — Z794 Long term (current) use of insulin: Secondary | ICD-10-CM | POA: Diagnosis not present

## 2020-06-28 LAB — CBC
HCT: 34.1 % — ABNORMAL LOW (ref 36.0–46.0)
Hemoglobin: 11.1 g/dL — ABNORMAL LOW (ref 12.0–15.0)
MCH: 27 pg (ref 26.0–34.0)
MCHC: 32.6 g/dL (ref 30.0–36.0)
MCV: 83 fL (ref 80.0–100.0)
Platelets: 284 10*3/uL (ref 150–400)
RBC: 4.11 MIL/uL (ref 3.87–5.11)
RDW: 15.3 % (ref 11.5–15.5)
WBC: 10.3 10*3/uL (ref 4.0–10.5)
nRBC: 0 % (ref 0.0–0.2)

## 2020-06-28 LAB — LIPID PANEL
Cholesterol: 126 mg/dL (ref 0–200)
HDL: 46 mg/dL (ref >40)
LDL Cholesterol: 63 mg/dL (ref 0–99)
Total CHOL/HDL Ratio: 2.7 RATIO
Triglycerides: 83 mg/dL (ref <150)
VLDL: 17 mg/dL (ref 0–40)

## 2020-06-28 LAB — BASIC METABOLIC PANEL
Anion gap: 11 (ref 5–15)
BUN: 50 mg/dL — ABNORMAL HIGH (ref 6–20)
CO2: 29 mmol/L (ref 22–32)
Calcium: 9.7 mg/dL (ref 8.9–10.3)
Chloride: 98 mmol/L (ref 98–111)
Creatinine, Ser: 2.22 mg/dL — ABNORMAL HIGH (ref 0.44–1.00)
GFR, Estimated: 26 mL/min — ABNORMAL LOW (ref >60)
Glucose, Bld: 158 mg/dL — ABNORMAL HIGH (ref 70–99)
Potassium: 3.9 mmol/L (ref 3.5–5.1)
Sodium: 138 mmol/L (ref 135–145)

## 2020-06-28 LAB — HEMOGLOBIN A1C
Hgb A1c MFr Bld: 7.5 % — ABNORMAL HIGH (ref 4.8–5.6)
Mean Plasma Glucose: 168.55 mg/dL

## 2020-06-28 NOTE — Progress Notes (Signed)
[name], [name] ([id number]) Visit Report for 06/18/2020 Allergy List Details Patient Name: [name], [name] Date of Service: 06/18/2020 8:30 AM Medical Record Number: [id number] Patient Account Number: [id number] Date of Birth/Sex: December 24, 1965 (55 y.o. F) Treating RN: [name] Primary Care [name]: PATIENT, NO Other Clinician: Jeanine [name] Referring [name]: Referral, Self Treating [name]/Extender: [name] in Treatment: 0 Allergies Active Allergies Tylenol PM Reaction: hives diazepam latex Reaction: itching simvastatin Reaction: joint pain ciprofloxacin Reaction: anxiety levofloxacin Reaction: anxiety Omnicef Reaction: anxiety Allergy Notes Electronic Signature(s) Signed: 06/18/2020 6:10:08 PM By: [name], BSN, RN, CWS, Kim RN, BSN Entered By: [name], BSN, RN, CWS, Kim on 06/18/2020 09:24:36 [name] ([id number]) -------------------------------------------------------------------------------- Arrival Information Details Patient Name: [name] Date of Service: 06/18/2020 8:30 AM Medical Record Number: [id number] Patient Account Number: [id number] Date of Birth/Sex: 03/16/1966 (54 y.o. F) Treating RN: [name] Primary Care [name]: PATIENT, NO Other Clinician: Jeanine [name] Referring [name]: Referral, Self Treating [name]/Extender: [name] in Treatment: 0 Visit Information Patient Arrived: Ambulatory Arrival Time: 09:13 Accompanied By: self Transfer Assistance: None Patient Identification Verified: Yes Secondary Verification Process Completed: Yes History Since Last Visit Electronic Signature(s) Signed: 06/18/2020 6:10:08 PM By: [name], BSN, RN, CWS, Kim RN, BSN Entered By: [name], BSN, RN, CWS, Kim on 06/18/2020 09:13:24 [name], [name] ([id number]) -------------------------------------------------------------------------------- Clinic Level of Care Assessment Details Patient Name: [name] Date of Service: 06/18/2020  8:30 AM Medical Record Number: [id number] Patient Account Number: [id number] Date of Birth/Sex: April 15, 1965 (55 y.o. F) Treating RN: [name] Primary Care Ruhee Enck: PATIENT, NO Other Clinician: Jeanine [name] Referring Milanni Ayub: Referral, Self Treating [name]/Extender: [name] in Treatment: 0 Clinic Level of Care Assessment Items TOOL 4 Quantity Score X - Use when only an EandM is performed on FOLLOW-UP visit 1 0 ASSESSMENTS - Nursing Assessment / Reassessment X - Reassessment of Co-morbidities (includes updates in patient status) 1 10 X- 1 5 Reassessment of Adherence to Treatment Plan ASSESSMENTS - Wound and Skin Assessment / Reassessment X - Simple Wound Assessment / Reassessment - one wound 1 5 []  - 0 Complex Wound Assessment / Reassessment - multiple wounds []  - 0 Dermatologic / Skin Assessment (not related to wound area) ASSESSMENTS - Focused Assessment X - Circumferential Edema Measurements - multi extremities 1 5 []  - 0 Nutritional Assessment / Counseling / Intervention []  - 0 Lower Extremity Assessment (monofilament, tuning fork, pulses) []  - 0 Peripheral Arterial Disease Assessment (using hand held doppler) ASSESSMENTS - Ostomy and/or Continence Assessment and Care []  - Incontinence Assessment and Management 0 []  - 0 Ostomy Care Assessment and Management (repouching, etc.) PROCESS - Coordination of Care X - Simple Patient / Family Education for ongoing care 1 15 []  - 0 Complex (extensive) Patient / Family Education for ongoing care []  - 0 Staff obtains Programmer, systems, Records, Test Results / Process Orders []  - 0 Staff telephones HHA, Nursing Homes / Clarify orders / etc []  - 0 Routine Transfer to another Facility (non-emergent condition) []  - 0 Routine Hospital Admission (non-emergent condition) []  - 0 New Admissions / Biomedical engineer / Ordering NPWT, Apligraf, etc. []  - 0 Emergency Hospital Admission (emergent condition) X- 1 10 Simple  Discharge Coordination []  - 0 Complex (extensive) Discharge Coordination PROCESS - Special Needs []  - Pediatric / Minor Patient Management 0 []  - 0 Isolation Patient Management []  - 0 Hearing / Language / Visual special needs []  - 0 Assessment of Community assistance (transportation, D/C planning, etc.) []  - 0 Additional assistance / Altered mentation []  - 0 Support  Surface(s) Assessment (bed, cushion, seat, etc.) INTERVENTIONS - Wound Cleansing / Measurement [name], [name] ([id number]) X- 1 5 Simple Wound Cleansing - one wound []  - 0 Complex Wound Cleansing - multiple wounds X- 1 5 Wound Imaging (photographs - any number of wounds) []  - 0 Wound Tracing (instead of photographs) X- 1 5 Simple Wound Measurement - one wound []  - 0 Complex Wound Measurement - multiple wounds INTERVENTIONS - Wound Dressings []  - Small Wound Dressing one or multiple wounds 0 X- 1 15 Medium Wound Dressing one or multiple wounds []  - 0 Large Wound Dressing one or multiple wounds []  - 0 Application of Medications - topical []  - 0 Application of Medications - injection INTERVENTIONS - Miscellaneous []  - External ear exam 0 []  - 0 Specimen Collection (cultures, biopsies, blood, body fluids, etc.) []  - 0 Specimen(s) / Culture(s) sent or taken to Lab for analysis []  - 0 Patient Transfer (multiple staff / Civil Service fast streamer / Similar devices) []  - 0 Simple Staple / Suture removal (25 or less) []  - 0 Complex Staple / Suture removal (26 or more) []  - 0 Hypo / Hyperglycemic Management (close monitor of Blood Glucose) []  - 0 Ankle / Brachial Index (ABI) - do not check if billed separately X- 1 5 Vital Signs Has the patient been seen at the hospital within the last three years: Yes Total Score: 85 Level Of Care: New/Established - Level 3 Electronic Signature(s) Signed: 06/18/2020 5:07:41 PM By: [name], Minus Breeding RN Entered By: [name], [name] on 06/18/2020 09:46:35 [name], [name]  ([id number]) -------------------------------------------------------------------------------- Compression Therapy Details Patient Name: [name] Date of Service: 06/18/2020 8:30 AM Medical Record Number: [id number] Patient Account Number: [id number] Date of Birth/Sex: [birth year] (55 y.o. F) Treating RN: [name] Primary Care Taijah Macrae: PATIENT, NO Other Clinician: Jeanine [name] Referring Avel Ogawa: Referral, Self Treating Ellise Kovack/Extender: [name] in Treatment: 0 Compression Therapy Performed for Wound Assessment: Wound #6 Right,Lateral Lower Leg Performed By: Clinician [name] Amen, RN Compression Type: Three Layer Post Procedure Diagnosis Same as Pre-procedure Electronic Signature(s) Signed: 06/18/2020 5:07:41 PM By: [name], Minus Breeding RN Entered By: [name], Minus Breeding on 06/18/2020 09:42:12 [place], [name] ([id number]) -------------------------------------------------------------------------------- Encounter Discharge Information Details Patient Name: [name] Date of Service: 06/18/2020 8:30 AM Medical Record Number: [id number] Patient Account Number: [id number] Date of Birth/Sex: [birth year] (55 y.o. F) Treating RN: Carlene Coria Primary Care Gerlad Pelzel: PATIENT, NO Other Clinician: Jeanine [name] Referring Soraiya Ahner: Referral, Self Treating Latia Mataya/Extender: [name] in Treatment: 0 Encounter Discharge Information Items Discharge Condition: Stable Ambulatory Status: Ambulatory Discharge Destination: Home Transportation: Private Auto Accompanied By: self Schedule Follow-up Appointment: Yes Clinical Summary of Care: Patient Declined Electronic Signature(s) Signed: 06/28/2020 8:11:40 AM By: Carlene Coria RN Entered By: Carlene Coria on 06/18/2020 10:02:14 [place], [name] ([id number]) -------------------------------------------------------------------------------- Lower Extremity Assessment Details Patient Name: [name] Date of  Service: 06/18/2020 8:30 AM Medical Record Number: [id number] Patient Account Number: [id number] Date of Birth/Sex: Nov 21, 1965 (55 y.o. F) Treating RN: [name] Primary Care Tykeem Lanzer: PATIENT, NO Other Clinician: Jeanine [name] Referring Taggert Bozzi: Referral, Self Treating Ember Henrikson/Extender: [name] in Treatment: 0 Edema Assessment Assessed: [Left: No] [Right: No] [Left: Edema] [Right: :] Calf Left: Right: Point of Measurement: 33 cm From Medial Instep 45.5 cm Ankle Left: Right: Point of Measurement: 10 cm From Medial Instep 24.6 cm Knee To Floor Left: Right: From Medial Instep 42 cm Vascular Assessment Pulses: Dorsalis Pedis Palpable: [Right:Yes] Doppler Audible: [Right:Yes] Posterior Tibial Palpable: [Right:Yes Yes] Notes ABI was not completed today due to  location and size of wound. Electronic Signature(s) Signed: 06/18/2020 6:10:08 PM By: [name], BSN, RN, CWS, Kim RN, BSN Entered By: [name], BSN, RN, CWS, Kim on 06/18/2020 09:24:17 [place], [name] ([id number]) -------------------------------------------------------------------------------- Multi Wound Chart Details Patient Name: [name] Date of Service: 06/18/2020 8:30 AM Medical Record Number: [id number] Patient Account Number: [id number] Date of Birth/Sex: 1966-02-21 (55 y.o. F) Treating RN: [name] Primary Care Lilton Pare: PATIENT, NO Other Clinician: Jeanine [name] Referring Adryanna Friedt: Referral, Self Treating Princess Karnes/Extender: [name] in Treatment: 0 Vital Signs Height(in): 67 Pulse(bpm): 85 Weight(lbs): 315 Blood Pressure(mmHg): 168/83 Body Mass Index(BMI): 49 Temperature(F): 98.3 Respiratory Rate(breaths/min): 16 Photos: [N/A:N/A] Wound Location: Right, Lateral Lower Leg N/A N/A Wounding Event: Trauma N/A N/A Primary Etiology: Trauma, Other N/A N/A Date Acquired: 05/07/2020 N/A N/A Weeks of Treatment: 0 N/A N/A Wound Status: Open N/A N/A Measurements L x W x D (cm)  18x20x0.1 N/A N/A Area (cm) : 282.743 N/A N/A Volume (cm) : 28.274 N/A N/A % Reduction in Area: 0.00% N/A N/A % Reduction in Volume: 0.00% N/A N/A Classification: Partial Thickness N/A N/A Exudate Amount: Large N/A N/A Exudate Type: Serosanguineous N/A N/A Exudate Color: red, brown N/A N/A Wound Margin: Flat and Intact N/A N/A Granulation Amount: None Present (0%) N/A N/A Necrotic Amount: None Present (0%) N/A N/A Exposed Structures: Fat Layer (Subcutaneous Tissue): N/A N/A Yes Fascia: No Sciarra, Ebony ([id number]) Tendon: No Muscle: No Joint: No Bone: No Epithelialization: None N/A N/A Treatment Notes Electronic Signature(s) Signed: 06/18/2020 5:07:41 PM By: [name], Minus Breeding RN Entered By: [name], Minus Breeding on 06/18/2020 09:41:18 [place], [name] ([id number]) -------------------------------------------------------------------------------- Multi-Disciplinary Care Plan Details Patient Name: [name] Date of Service: 06/18/2020 8:30 AM Medical Record Number: [id number] Patient Account Number: [id number] Date of Birth/Sex: 1965-08-14 (55 y.o. F) Treating RN: [name] Primary Care Conal Shetley: PATIENT, NO Other Clinician: Jeanine [name] Referring Justen Fonda: Referral, Self Treating Christinea Brizuela/Extender: [name] in Treatment: 0 Active Inactive Wound/Skin Impairment Nursing Diagnoses: Impaired tissue integrity Goals: Patient/caregiver will verbalize understanding of skin care regimen Date Initiated: 06/18/2020 Target Resolution Date: 06/18/2020 Goal Status: Active Ulcer/skin breakdown will have a volume reduction of 30% by week 4 Date Initiated: 06/18/2020 Target Resolution Date: 07/19/2020 Goal Status: Active Ulcer/skin breakdown will have a volume reduction of 50% by week 8 Date Initiated: 06/18/2020 Target Resolution Date: 08/18/2020 Goal Status: Active Ulcer/skin breakdown will have a volume reduction of 80% by week 12 Date Initiated:  06/18/2020 Target Resolution Date: 09/18/2020 Goal Status: Active Interventions: Assess patient/caregiver ability to obtain necessary supplies Assess patient/caregiver ability to perform ulcer/skin care regimen upon admission and as needed Assess ulceration(s) every visit Provide education on ulcer and skin care Treatment Activities: Referred to DME Dondrell Loudermilk for dressing supplies : 06/18/2020 Skin care regimen initiated : 06/18/2020 Notes: Electronic Signature(s) Signed: 06/18/2020 5:07:41 PM By: [name], Minus Breeding RN Entered By: [name], Minus Breeding on 06/18/2020 09:40:54 Cornacchia, Ysabela ([id number]) -------------------------------------------------------------------------------- Pain Assessment Details Patient Name: [name] Date of Service: 06/18/2020 8:30 AM Medical Record Number: [id number] Patient Account Number: [id number] Date of Birth/Sex: [birth year] (55 y.o. F) Treating RN: [name] Primary Care Alastor Kneale: PATIENT, NO Other Clinician: Jeanine [name] Referring Shauni Henner: Referral, Self Treating Talha Iser/Extender: [name] in Treatment: 0 Active Problems Location of Pain Severity and Description of Pain Patient Has Paino Yes Site Locations Pain Location: Generalized Pain, Pain in Ulcers With Dressing Change: Yes Rate the pain. Current Pain Level: 10 Pain Management and Medication Current Pain Management: Electronic Signature(s) Signed: 06/18/2020 6:10:08 PM By: [name], BSN, RN,  CWS, Kim RN, BSN Entered By: [name], BSN, RN, CWS, Kim on 06/18/2020 09:13:46 [place], [name] ([id number]) -------------------------------------------------------------------------------- Patient/Caregiver Education Details Patient Name: [name] Date of Service: 06/18/2020 8:30 AM Medical Record Number: [id number] Patient Account Number: [id number] Date of Birth/Gender: 1966/02/08 (55 y.o. F) Treating RN: [name] Primary Care Physician: PATIENT, NO Other Clinician:  Jeanine [name] Referring Physician: Referral, Self Treating Physician/Extender: [name] in Treatment: 0 Education Assessment Education Provided To: Patient Education Topics Provided Wound/Skin Impairment: Methods: Explain/Verbal Responses: State content correctly Electronic Signature(s) Signed: 06/18/2020 5:07:41 PM By: [name], Minus Breeding RN Entered By: [name], Minus Breeding on 06/18/2020 09:46:59 Hawkes, Marlo ([id number]) -------------------------------------------------------------------------------- Wound Assessment Details Patient Name: [name] Date of Service: 06/18/2020 8:30 AM Medical Record Number: [id number] Patient Account Number: [id number] Date of Birth/Sex: 1965/12/13 (55 y.o. F) Treating RN: [name] Primary Care Trent Theisen: PATIENT, NO Other Clinician: Jeanine [name] Referring Kasidi Shanker: Referral, Self Treating Maddyn Lieurance/Extender: [name] in Treatment: 0 Wound Status Wound Number: 6 Primary Etiology: Trauma, Other Wound Location: Right, Lateral Lower Leg Wound Status: Open Wounding Event: Trauma Date Acquired: 05/07/2020 Weeks Of Treatment: 0 Clustered Wound: No Photos Wound Measurements Length: (cm) 18 Width: (cm) 20 Depth: (cm) 0.1 Area: (cm) 282.743 Volume: (cm) 28.274 % Reduction in Area: 0% % Reduction in Volume: 0% Epithelialization: None Tunneling: No Undermining: No Wound Description Classification: Partial Thickness Wound Margin: Flat and Intact Exudate Amount: Large Exudate Type: Serosanguineous Exudate Color: red, brown Foul Odor After Cleansing: No Slough/Fibrino No Wound Bed Granulation Amount: None Present (0%) Exposed Structure Necrotic Amount: None Present (0%) Fascia Exposed: No Fat Layer (Subcutaneous Tissue) Exposed: Yes Tendon Exposed: No Muscle Exposed: No Joint Exposed: No Bone Exposed: No Electronic Signature(s) Signed: 06/18/2020 6:10:08 PM By: [name], BSN, RN, CWS, Kim RN,  BSN Entered By: [name], BSN, RN, CWS, Kim on 06/18/2020 09:21:37 [place], [name] ([id number]) -------------------------------------------------------------------------------- [place] Details Patient Name: [name] Date of Service: 06/18/2020 8:30 AM Medical Record Number: [id number] Patient Account Number: [id number] Date of Birth/Sex: [birth year] (54 y.o. F) Treating RN: [name] Primary Care Kasean Denherder: PATIENT, NO Other Clinician: Jeanine [name] Referring Elidia Bonenfant: Referral, Self Treating Lynnzie Blackson/Extender: [name] in Treatment: 0 Vital Signs Time Taken: 09:13 Temperature (F): 98.3 Height (in): 67 Pulse (bpm): 85 Weight (lbs): 315 Respiratory Rate (breaths/min): 16 Body Mass Index (BMI): 49.3 Blood Pressure (mmHg): 168/83 Reference Range: 80 - 120 mg / dl Electronic Signature(s) Signed: 06/18/2020 6:10:08 PM By: [name], BSN, RN, CWS, Kim RN, BSN Entered By: [name], BSN, RN, CWS, Kim on 06/18/2020 09:14:37

## 2020-06-28 NOTE — Patient Instructions (Addendum)
EKG done today.  Labs done today. We will contact you only if your labs are abnormal.  No medication changes were made. Please continue all current medications as prescribed.  Your provider has recommended that you have a home sleep study.  We have provided you with the equipment in our office today. Please download the app and follow the instructions. YOUR PIN NUMBER IS: [id number]. Once you have completed the test you just dispose of the equipment, the information is automatically uploaded to [place] via blue-tooth technology. If your test is positive for sleep apnea and you need a home CPAP machine you will be contacted by Dr Theodosia Blender office [facility]) to set this up.  Your physician recommends that you schedule a follow-up appointment in: 2 months  If you have any questions or concerns before your next appointment please send [place] a message through [place] or call our office at [phone].    TO LEAVE A MESSAGE FOR THE NURSE SELECT OPTION 2, PLEASE LEAVE A MESSAGE INCLUDING: . YOUR NAME . DATE OF BIRTH . CALL BACK NUMBER . REASON FOR CALL**this is important as we prioritize the call backs  YOU WILL RECEIVE A CALL BACK THE SAME DAY AS LONG AS YOU CALL BEFORE 4:00 PM   Do the following things EVERYDAY: 1) Weigh yourself in the morning before breakfast. Write it down and keep it in a log. 2) Take your medicines as prescribed 3) Eat low salt foods--Limit salt (sodium) to 2000 mg per day.  4) Stay as active as you can everyday 5) Limit all fluids for the day to less than 2 liters   At the [name], you and your health needs are our priority. As part of our continuing mission to provide you with exceptional heart care, we have created designated Provider Care Teams. These Care Teams include your primary Cardiologist (physician) and Advanced Practice Providers (APPs- Physician Assistants and Nurse Practitioners) who all work together to provide you with the care you need,  when you need it.   You may see any of the following providers on your designated Care Team at your next follow up: Marland Kitchen Dr Glori Bickers . Dr Loralie Champagne . Darrick Grinder, NP . Lyda Jester, PA . Audry Riles, PharmD   Please be sure to bring in all your medications bottles to every appointment.
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
## 2020-07-01 ENCOUNTER — Telehealth (HOSPITAL_COMMUNITY): Payer: Self-pay | Admitting: Surgery

## 2020-07-01 NOTE — Telephone Encounter (Signed)
I called [name] regarding completion of her ordered home sleep study.  I reviewed instructions and let her know that she does not require insurance prior authorization.  She acknowledges understanding and will proceed with the study.

## 2020-07-04 ENCOUNTER — Other Ambulatory Visit: Payer: Self-pay

## 2020-07-04 ENCOUNTER — Encounter: Payer: Medicare Other | Admitting: Physician Assistant

## 2020-07-04 DIAGNOSIS — I872 Venous insufficiency (chronic) (peripheral): Secondary | ICD-10-CM | POA: Diagnosis not present

## 2020-07-04 NOTE — Progress Notes (Addendum)
CHANIN, FRUMKIN ([id number]) Visit Report for 07/04/2020 Chief Complaint Document Details Patient Name: [name], [name] Date of Service: 07/04/2020 3:00 PM Medical Record Number: [id number] Patient Account Number: [id number] Date of Birth/Sex: [birth year] (55 y.o. F) Treating RN: Dolan Amen Primary Care Provider: PATIENT, NO Other Clinician: Jeanine Luz Referring Provider: Referral, Self Treating Provider/Extender: Skipper Cliche in Treatment: 2 Information Obtained from: Patient Chief Complaint Right LE Ulcer Electronic Signature(s) Signed: 07/04/2020 3:28:08 PM By: Worthy Keeler PA-C Entered By: Worthy Keeler on 07/04/2020 15:28:07 Mccready, [name] ([id number]) -------------------------------------------------------------------------------- HPI Details Patient Name: [name] Date of Service: 07/04/2020 3:00 PM Medical Record Number: [id number] Patient Account Number: [id number] Date of Birth/Sex: 1965/07/01 (55 y.o. F) Treating RN: Dolan Amen Primary Care Provider: PATIENT, NO Other Clinician: Jeanine Luz Referring Provider: Referral, Self Treating Provider/Extender: Skipper Cliche in Treatment: 2 History of Present Illness HPI Description: 10/24/2018 on evaluation today patient presents for initial evaluation or clinic concerning issues that she has been having with lymphedema for quite some time. Unfortunately she has several wound openings at this point that secondary to her lymphedema/venous stasis are giving her trouble and leaking quite severely. She also has diabetes along with hypertension and stage III chronic kidney disease. Fortunately there is no signs of active infection at this time. She is going to likely require debridement of the left leg ulcer upon evaluation today just based on what I am seeing. Fortunately there is no wound opening on the right. Overall the patient seems to be doing quite well and again there is no evidence of systemic  infection which is good news. No fevers, chills, nausea, vomiting, or diarrhea. 10/31/2018 on evaluation today patient actually appears to be doing excellent in regard to her lower extremity ulcers. She has been tolerating the dressing changes without complication. Fortunately there is no signs of active infection at this time. She has tolerated 3 layer compression wrap without complication. 11/07/2018 upon evaluation today patient actually appears to be doing very well with regard to her left lower extremity ulcers. She has been tolerating the dressing changes without complication. Fortunately there is no signs of active infection. No fevers, chills, nausea, vomiting, or diarrhea. 11/21/2018 upon evaluation today patient appears to be doing quite well with regard to her lower extremity ulcers. In fact both areas seem to be showing signs of good improvement which is excellent. She is not having as much pain as she has in the past and again has a lot of healing compared to previous visits as well. 12/05/2018 on evaluation today patient presents for follow-up concerning her ongoing issues with her bilateral lower extremity ulcers. The good news is her right lower extremity is showing signs of completely healing at this time which is great news. Fortunately there is no evidence of active infection. On the left she has just a very small area still remaining that is open at this time all in all she is very close to complete closure in my opinion. She does have compression stockings to wear at home. 12/12/2018 on evaluation today patient appears to be doing excellent in regard to her left lateral lower extremity ulcer. She has been tolerating the dressing changes without complication. Fortunately there is no signs of active infection at this time. In fact this appears to be pretty much healed at this point although again she is not 100% today. No fevers, chills, nausea, vomiting, or diarrhea. 12/19/2018 on  evaluation today patient actually appears to be doing excellent with regard to  her lower extremity ulcer in fact this appears to be completely healed today which is all some. She has done extremely well with wound care measures. No fevers, chills, nausea, vomiting, or diarrhea. ------------------------------------ 11/01/19-Readmission to the clinic Patient presents with left leg pain, wounds posterior calf, onset about 4 weeks, denies any fevers chills or shakes, has not been using anything to these wounds, was given compression stockings at last discharge from the clinic but has not been able to use them as they rolled down and cause creases Patient's history significant for type 2 diabetes insulin requiring, A1c of 6.9 lately, hypertension, chronic pain 8/18; patient readmitted that the clinic last week. She has wounds on her left lateral posterior lower leg all of this in close juxtaposition i.e. a localized site. We've been using silver alginate under 3 layer compression apparently the wound surface area is better. She was wearing compression stockings from elastic therapy but says they were falling down. As she progresses more towards healing will need to address what we use in terms of compression stockings perhaps external compression garments 11/17/2019 on evaluation today patient appears to be doing well with regard to her legs currently. She does have 2 wounds which are actually measuring much better the more posterior is actually doing very well which I am pleased with the other though smaller is not quite a small but nonetheless on the lateral aspect does seem to be improving. 11/23/2019 on evaluation today patient appears to be doing well in regard to her wounds. In fact the wound on the posterior aspect of her leg appears healed the lateral aspect is still open but extremely small. 9/15; this is a patient with chronic venous insufficiency and secondary lymphedema. She did not keep her  clinic appointment last week and hence the left leg is a lot more swollen since she had to take the wrap off at some point. She has a small weeping area on the left lateral lower leg. She has probably a juxta lite stocking for the left leg I have asked her to bring that in when she comes to her clinic appointment next week at which time she should be healed 10/6; this is a patient with chronic venous insufficiency and secondary lymphedema left greater than right. Skin changes of chronic lymphedema especially in the left leg. She has not been here in 3 weeks. She took the wrap off 2 weeks ago. She has some very old 20/30 stockings from elastic therapy in [place] she has been using. Fortunately her leg is closed. She also has a single Farrow wrap for the left leg Readmission: 06/18/2020 upon evaluation today patient presents for reevaluation here in the clinic. She is a long-term patient that we have seen intermittently when she has had flareups of issues with her lower extremity secondary to chronic venous stasis. With that being said unfortunately today she is having a significant issue with a wound which is actually quite significant over the right lower extremity. She did see dermatology they put Vaseline followed by [place] dressings on her unfortunately she is continuing to have significant issues however here with pain and in fact this wrap was extremely stuck to her upon evaluation today. Nonetheless I believe that she is going to require a little bit different approach to try to keep things from sticking and hopefully aid in getting this to heal more effectively and quickly. With that being said she does have a history of Nodine, Evamarie ([id number]) chronic venous insufficiency, lymphedema,  diabetes mellitus type 2, chronic kidney disease stage III, hypertension, and congestive heart failure. This started as an injury in mid February and has worsened over the past 2 weeks. 07/04/2020 upon  evaluation today patient appears to be doing well with regard to her wound on the leg. Overall I am extremely pleased with where things stand and I do think that she is making excellent progress. There is no sign of infection right now which is also great news. I think that she is close to complete resolution. Electronic Signature(s) Signed: 07/04/2020 5:32:44 PM By: Worthy Keeler PA-C Entered By: Worthy Keeler on 07/04/2020 17:32:44 [name], [name] ([id number]) -------------------------------------------------------------------------------- Physical Exam Details Patient Name: [name] Date of Service: 07/04/2020 3:00 PM Medical Record Number: [id number] Patient Account Number: [id number] Date of Birth/Sex: [birth year] (55 y.o. F) Treating RN: Dolan Amen Primary Care Provider: PATIENT, NO Other Clinician: Jeanine Luz Referring Provider: Referral, Self Treating Provider/Extender: Skipper Cliche in Treatment: 2 Constitutional Well-nourished and well-hydrated in no acute distress. Respiratory normal breathing without difficulty. Psychiatric this patient is able to make decisions and demonstrates good insight into disease process. Alert and Oriented x 3. pleasant and cooperative. Notes Patient's wound bed showed signs of good granulation epithelization at this point and in fact I am extremely pleased with how quickly this is healed and how small the open area is I think she is very close to complete resolution. Electronic Signature(s) Signed: 07/04/2020 5:32:58 PM By: Worthy Keeler PA-C Entered By: Worthy Keeler on 07/04/2020 17:32:58 [name], [name] ([id number]) -------------------------------------------------------------------------------- Physician Orders Details Patient Name: [name] Date of Service: 07/04/2020 3:00 PM Medical Record Number: [id number] Patient Account Number: [id number] Date of Birth/Sex: 1965/12/22 (55 y.o. F) Treating RN: Dolan Amen Primary Care Provider: PATIENT, NO Other Clinician: Jeanine Luz Referring Provider: Referral, Self Treating Provider/Extender: Skipper Cliche in Treatment: 2 Verbal / Phone Orders: No Diagnosis Coding ICD-10 Coding Code Description I87.2 Venous insufficiency (chronic) (peripheral) I89.0 Lymphedema, not elsewhere classified L97.811 Non-pressure chronic ulcer of other part of right lower leg limited to breakdown of skin E11.622 Type 2 diabetes mellitus with other skin ulcer N18.30 Chronic kidney disease, stage 3 unspecified I10 Essential (primary) hypertension I50.42 Chronic combined systolic (congestive) and diastolic (congestive) heart failure Follow-up Appointments o Return Appointment in 1 week. Bathing/ Shower/ Hygiene Wound #6 Right,Lateral Lower Leg o May shower with wound dressing protected with water repellent cover or cast protector. Edema Control - Lymphedema / Segmental Compressive Device / Other o Optional: One layer of unna paste to top of compression wrap (to act as an anchor). o 3 Layer Compression System for Lymphedema. Wound Treatment Wound #6 - Lower Leg Wound Laterality: Right, Lateral Cleanser: Soap and Water 1 x Per Week/30 Days Discharge Instructions: Gently cleanse wound with antibacterial soap, rinse and pat dry prior to dressing wounds Primary Dressing: Curad Oil Emulsion Dressing 3x3 (in/in) 1 x Per Week/30 Days Discharge Instructions: Apply on wound bed Secondary Dressing: ABD Pad 5x9 (in/in) 1 x Per Week/30 Days Discharge Instructions: Cover dressing Compression Wrap: Profore Lite LF 3 Multilayer Compression Bandaging System 1 x Per Week/30 Days Discharge Instructions: Apply 3 multi-layer wrap as prescribed. Electronic Signature(s) Signed: 07/04/2020 5:28:26 PM By: Georges Mouse, Minus Breeding RN Signed: 07/04/2020 5:45:27 PM By: Worthy Keeler PA-C Entered By: Georges Mouse, Minus Breeding on 07/04/2020 15:54:14 [name], [name]  ([id number]) -------------------------------------------------------------------------------- Problem List Details Patient Name: [name] Date of Service: 07/04/2020 3:00 PM Medical Record Number: [id number] Patient Account Number: [id number] Date  of Birth/Sex: 03/31/1965 (55 y.o. F) Treating RN: Dolan Amen Primary Care Provider: PATIENT, NO Other Clinician: Jeanine Luz Referring Provider: Referral, Self Treating Provider/Extender: Skipper Cliche in Treatment: 2 Active Problems ICD-10 Encounter Code Description Active Date MDM Diagnosis I87.2 Venous insufficiency (chronic) (peripheral) 06/18/2020 No Yes I89.0 Lymphedema, not elsewhere classified 06/18/2020 No Yes L97.811 Non-pressure chronic ulcer of other part of right lower leg limited to 06/18/2020 No Yes breakdown of skin E11.622 Type 2 diabetes mellitus with other skin ulcer 06/18/2020 No Yes N18.30 Chronic kidney disease, stage 3 unspecified 06/18/2020 No Yes I10 Essential (primary) hypertension 06/18/2020 No Yes I50.42 Chronic combined systolic (congestive) and diastolic (congestive) heart 06/18/2020 No Yes failure Inactive Problems Resolved Problems Electronic Signature(s) Signed: 07/04/2020 3:28:02 PM By: Worthy Keeler PA-C Entered By: Worthy Keeler on 07/04/2020 15:28:01 [name], [name] ([id number]) -------------------------------------------------------------------------------- Progress Note Details Patient Name: [name] Date of Service: 07/04/2020 3:00 PM Medical Record Number: [id number] Patient Account Number: [id number] Date of Birth/Sex: Apr 18, 1965 (55 y.o. F) Treating RN: Dolan Amen Primary Care Provider: PATIENT, NO Other Clinician: Jeanine Luz Referring Provider: Referral, Self Treating Provider/Extender: Skipper Cliche in Treatment: 2 Subjective Chief Complaint Information obtained from Patient Right LE Ulcer History of Present Illness (HPI) 10/24/2018 on evaluation today  patient presents for initial evaluation or clinic concerning issues that she has been having with lymphedema for quite some time. Unfortunately she has several wound openings at this point that secondary to her lymphedema/venous stasis are giving her trouble and leaking quite severely. She also has diabetes along with hypertension and stage III chronic kidney disease. Fortunately there is no signs of active infection at this time. She is going to likely require debridement of the left leg ulcer upon evaluation today just based on what I am seeing. Fortunately there is no wound opening on the right. Overall the patient seems to be doing quite well and again there is no evidence of systemic infection which is good news. No fevers, chills, nausea, vomiting, or diarrhea. 10/31/2018 on evaluation today patient actually appears to be doing excellent in regard to her lower extremity ulcers. She has been tolerating the dressing changes without complication. Fortunately there is no signs of active infection at this time. She has tolerated 3 layer compression wrap without complication. 11/07/2018 upon evaluation today patient actually appears to be doing very well with regard to her left lower extremity ulcers. She has been tolerating the dressing changes without complication. Fortunately there is no signs of active infection. No fevers, chills, nausea, vomiting, or diarrhea. 11/21/2018 upon evaluation today patient appears to be doing quite well with regard to her lower extremity ulcers. In fact both areas seem to be showing signs of good improvement which is excellent. She is not having as much pain as she has in the past and again has a lot of healing compared to previous visits as well. 12/05/2018 on evaluation today patient presents for follow-up concerning her ongoing issues with her bilateral lower extremity ulcers. The good news is her right lower extremity is showing signs of completely healing at this  time which is great news. Fortunately there is no evidence of active infection. On the left she has just a very small area still remaining that is open at this time all in all she is very close to complete closure in my opinion. She does have compression stockings to wear at home. 12/12/2018 on evaluation today patient appears to be doing excellent in regard to her left lateral lower  extremity ulcer. She has been tolerating the dressing changes without complication. Fortunately there is no signs of active infection at this time. In fact this appears to be pretty much healed at this point although again she is not 100% today. No fevers, chills, nausea, vomiting, or diarrhea. 12/19/2018 on evaluation today patient actually appears to be doing excellent with regard to her lower extremity ulcer in fact this appears to be completely healed today which is all some. She has done extremely well with wound care measures. No fevers, chills, nausea, vomiting, or diarrhea. ------------------------------------ 11/01/19-Readmission to the clinic Patient presents with left leg pain, wounds posterior calf, onset about 4 weeks, denies any fevers chills or shakes, has not been using anything to these wounds, was given compression stockings at last discharge from the clinic but has not been able to use them as they rolled down and cause creases Patient's history significant for type 2 diabetes insulin requiring, A1c of 6.9 lately, hypertension, chronic pain 8/18; patient readmitted that the clinic last week. She has wounds on her left lateral posterior lower leg all of this in close juxtaposition i.e. a localized site. We've been using silver alginate under 3 layer compression apparently the wound surface area is better. She was wearing compression stockings from elastic therapy but says they were falling down. As she progresses more towards healing will need to address what we use in terms of compression stockings  perhaps external compression garments 11/17/2019 on evaluation today patient appears to be doing well with regard to her legs currently. She does have 2 wounds which are actually measuring much better the more posterior is actually doing very well which I am pleased with the other though smaller is not quite a small but nonetheless on the lateral aspect does seem to be improving. 11/23/2019 on evaluation today patient appears to be doing well in regard to her wounds. In fact the wound on the posterior aspect of her leg appears healed the lateral aspect is still open but extremely small. 9/15; this is a patient with chronic venous insufficiency and secondary lymphedema. She did not keep her clinic appointment last week and hence the left leg is a lot more swollen since she had to take the wrap off at some point. She has a small weeping area on the left lateral lower leg. She has probably a juxta lite stocking for the left leg I have asked her to bring that in when she comes to her clinic appointment next week at which time she should be healed 10/6; this is a patient with chronic venous insufficiency and secondary lymphedema left greater than right. Skin changes of chronic lymphedema especially in the left leg. She has not been here in 3 weeks. She took the wrap off 2 weeks ago. She has some very old 20/30 stockings from elastic therapy in  she has been using. Fortunately her leg is closed. She also has a single Farrow wrap for the left leg Readmission: 06/18/2020 upon evaluation today patient presents for reevaluation here in the clinic. She is a long-term patient that we have seen intermittently [name], [name] ([id number]) when she has had flareups of issues with her lower extremity secondary to chronic venous stasis. With that being said unfortunately today she is having a significant issue with a wound which is actually quite significant over the right lower extremity. She did see dermatology  they put Vaseline followed by [place] dressings on her unfortunately she is continuing to have significant issues however  here with pain and in fact this wrap was extremely stuck to her upon evaluation today. Nonetheless I believe that she is going to require a little bit different approach to try to keep things from sticking and hopefully aid in getting this to heal more effectively and quickly. With that being said she does have a history of chronic venous insufficiency, lymphedema, diabetes mellitus type 2, chronic kidney disease stage III, hypertension, and congestive heart failure. This started as an injury in mid February and has worsened over the past 2 weeks. 07/04/2020 upon evaluation today patient appears to be doing well with regard to her wound on the leg. Overall I am extremely pleased with where things stand and I do think that she is making excellent progress. There is no sign of infection right now which is also great news. I think that she is close to complete resolution. Objective Constitutional Well-nourished and well-hydrated in no acute distress. Vitals Time Taken: 3:21 PM, Height: 67 in, Weight: 315 lbs, BMI: 49.3, Temperature: 98.4 F, Pulse: 84 bpm, Respiratory Rate: 18 breaths/min, Blood Pressure: 146/67 mmHg. Respiratory normal breathing without difficulty. Psychiatric this patient is able to make decisions and demonstrates good insight into disease process. Alert and Oriented x 3. pleasant and cooperative. General Notes: Patient's wound bed showed signs of good granulation epithelization at this point and in fact I am extremely pleased with how quickly this is healed and how small the open area is I think she is very close to complete resolution. Integumentary (Hair, Skin) Wound #6 status is Open. Original cause of wound was Trauma. The date acquired was: 05/07/2020. The wound has been in treatment 2 weeks. The wound is located on the Right,Lateral Lower Leg. The  wound measures 3.5cm length x 2.5cm width x 0.1cm depth; 6.872cm^2 area and 0.687cm^3 volume. There is Fat Layer (Subcutaneous Tissue) exposed. There is no tunneling or undermining noted. There is a large amount of serosanguineous drainage noted. The wound margin is flat and intact. There is large (67-100%) red granulation within the wound bed. There is a small (1-33%) amount of necrotic tissue within the wound bed including Adherent Slough. 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 There was a Three Layer Compression Therapy Procedure by Dolan Amen, RN. Post procedure Diagnosis Wound #6: Same as Pre-Procedure Notes: pt tolerating wrap well. [name], [name] ([id number]) Plan Follow-up Appointments: Return Appointment in 1 week. Bathing/ Shower/ Hygiene: Wound #6 Right,Lateral Lower Leg: May shower with wound dressing protected with water repellent cover or cast protector. Edema Control - Lymphedema / Segmental Compressive Device / Other: Optional: One layer of unna paste to top of compression wrap (to act as an anchor). 3 Layer Compression System for Lymphedema. WOUND #6: - Lower Leg Wound Laterality: Right, Lateral Cleanser: Soap and Water 1 x Per Week/30 Days Discharge Instructions: Gently cleanse wound with antibacterial soap, rinse and pat dry prior to dressing wounds Primary Dressing: Curad Oil Emulsion Dressing 3x3 (in/in) 1 x Per Week/30 Days Discharge Instructions: Apply on wound bed Secondary  Dressing: ABD Pad 5x9 (in/in) 1 x Per Week/30 Days Discharge Instructions: Cover dressing Compression Wrap:  Profore Lite LF 3 Multilayer Compression Bandaging System 1 x Per Week/30 Days Discharge Instructions: Apply 3 multi-layer wrap as prescribed. 1. We continue with the wound care measures as before and the patient is in agreement with plan this includes the use of theRecommend oil emulsion dressing which I think is done a great job. 2. We will cover this with an ABD pad. 3. I am also can I suggest a 3 layer compression wrap to help with edema control. We will see patient back for reevaluation in 1 week here in the clinic. If anything worsens or changes patient will contact our office for additional recommendations. Electronic Signature(s) Signed: 07/04/2020 5:33:25 PM By: Worthy Keeler PA-C Entered By: Worthy Keeler on 07/04/2020 17:33:25 [name], [name] ([id number]) -------------------------------------------------------------------------------- SuperBill Details Patient Name: [name] Date of Service: 07/04/2020 Medical Record Number: [id number] Patient Account Number: [id number] Date of Birth/Sex: February 26, 1966 (55 y.o. 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.622 Type 2 diabetes mellitus with other skin ulcer N18.30 Chronic kidney disease, stage 3 unspecified I10 Essential (primary) hypertension I50.42 Chronic combined systolic (congestive) and diastolic (congestive) heart failure Facility Procedures CPT4 Code: 17915056 Description: (Facility Use Only) [phone] - [facility] RT LEG Modifier: Quantity: 1 Physician Procedures CPT4 Code: 6553748 Description: 27078 - WC PHYS LEVEL 3 - EST PT Modifier: Quantity: 1 CPT4 Code: Description: ICD-10 Diagnosis Description I87.2 Venous insufficiency (chronic) (peripheral) I89.0 Lymphedema, not elsewhere classified 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

## 2020-07-08 NOTE — Progress Notes (Signed)
KANI, JOBSON ([id number]) Visit Report for 07/04/2020 Arrival Information Details Patient Name: [name], [name] Date of Service: 07/04/2020 3:00 PM Medical Record Number: [id number] Patient Account Number: [id number] Date of Birth/Sex: 08-Dec-1965 (55 y.o. F) Treating RN: Donnamarie Poag Primary Care Demetri Goshert: PATIENT, NO Other Clinician: Jeanine Luz Referring Aleece Loyd: Referral, Self Treating Yaphet Smethurst/Extender: Skipper Cliche in Treatment: 2 Visit Information History Since Last Visit Added or deleted any medications: No Patient Arrived: Ambulatory Had a fall or experienced change in No Arrival Time: 15:20 activities of daily living that may affect Accompanied By: self risk of falls: Transfer Assistance: None Hospitalized since last visit: No Patient Identification Verified: Yes Pain Present Now: Yes Secondary Verification Process Completed: Yes Patient Has Alerts: Yes Patient Alerts: Patient on Blood Thinner DIABETIC Electronic Signature(s) Signed: 07/04/2020 5:20:48 PM By: Donnamarie Poag Entered By: Donnamarie Poag on 07/04/2020 15:21:42 Dauphinee, [name] ([id number]) -------------------------------------------------------------------------------- Clinic Level of Care Assessment Details Patient Name: [name] Date of Service: 07/04/2020 3:00 PM Medical Record Number: [id number] Patient Account Number: [id number] Date of Birth/Sex: 1965-11-13 (55 y.o. F) Treating RN: Dolan Amen Primary Care Malakhi Markwood: PATIENT, NO Other Clinician: Jeanine Luz Referring Maan Zarcone: Referral, Self Treating Azarion Hove/Extender: Skipper Cliche in Treatment: 2 Clinic Level of Care Assessment Items TOOL 1 Quantity Score []  - Use when EandM and Procedure is performed on INITIAL visit 0 ASSESSMENTS - Nursing Assessment / Reassessment []  - General Physical Exam (combine w/ comprehensive assessment (listed just below) when performed on new 0 pt. evals) []  - 0 Comprehensive Assessment (HX, ROS,  Risk Assessments, Wounds Hx, etc.) ASSESSMENTS - Wound and Skin Assessment / Reassessment []  - Dermatologic / Skin Assessment (not related to wound area) 0 ASSESSMENTS - Ostomy and/or Continence Assessment and Care []  - Incontinence Assessment and Management 0 []  - 0 Ostomy Care Assessment and Management (repouching, etc.) PROCESS - Coordination of Care []  - Simple Patient / Family Education for ongoing care 0 []  - 0 Complex (extensive) Patient / Family Education for ongoing care []  - 0 Staff obtains Programmer, systems, Records, Test Results / Process Orders []  - 0 Staff telephones HHA, Nursing Homes / Clarify orders / etc []  - 0 Routine Transfer to another Facility (non-emergent condition) []  - 0 Routine Hospital Admission (non-emergent condition) []  - 0 New Admissions / Biomedical engineer / Ordering NPWT, Apligraf, etc. []  - 0 Emergency Hospital Admission (emergent condition) PROCESS - Special Needs []  - Pediatric / Minor Patient Management 0 []  - 0 Isolation Patient Management []  - 0 Hearing / Language / Visual special needs []  - 0 Assessment of Community assistance (transportation, D/C planning, etc.) []  - 0 Additional assistance / Altered mentation []  - 0 Support Surface(s) Assessment (bed, cushion, seat, etc.) INTERVENTIONS - Miscellaneous []  - External ear exam 0 []  - 0 Patient Transfer (multiple staff / Civil Service fast streamer / Similar devices) []  - 0 Simple Staple / Suture removal (25 or less) []  - 0 Complex Staple / Suture removal (26 or more) []  - 0 Hypo/Hyperglycemic Management (do not check if billed separately) []  - 0 Ankle / Brachial Index (ABI) - do not check if billed separately Has the patient been seen at the hospital within the last three years: Yes Total Score: 0 Level Of Care: ____ [name] ([id number]) Electronic Signature(s) Signed: 07/04/2020 5:28:26 PM By: Georges Mouse, Minus Breeding RN Entered By: Georges Mouse, Minus Breeding on 07/04/2020 15:54:20 Radziewicz,  [name] ([id number]) -------------------------------------------------------------------------------- Compression Therapy Details Patient Name: [name] Date of Service: 07/04/2020 3:00 PM Medical Record Number: [id number] Patient Account Number:  [id number] Date of Birth/Sex: 02-Oct-1965 (55 y.o. F) Treating RN: Dolan Amen Primary Care Dahmir Epperly: PATIENT, NO Other Clinician: Jeanine Luz Referring Sheryl Towell: Referral, Self Treating Ketih Goodie/Extender: Skipper Cliche in Treatment: 2 Compression Therapy Performed for Wound Assessment: Wound #6 Right,Lateral Lower Leg Performed By: Clinician Dolan Amen, RN Compression Type: Three Layer Post Procedure Diagnosis Same as Pre-procedure Notes pt tolerating wrap well Electronic Signature(s) Signed: 07/04/2020 5:28:26 PM By: Georges Mouse, Minus Breeding RN Entered By: Georges Mouse, Minus Breeding on 07/04/2020 15:52:05 Manso, [name] ([id number]) -------------------------------------------------------------------------------- Encounter Discharge Information Details Patient Name: [name] Date of Service: 07/04/2020 3:00 PM Medical Record Number: [id number] Patient Account Number: [id number] Date of Birth/Sex: 1965-11-23 (55 y.o. F) Treating RN: Carlene Coria Primary Care Sinda Leedom: PATIENT, NO Other Clinician: Jeanine Luz Referring Lajarvis [name]: Referral, Self Treating Allanah Mcfarland/Extender: Skipper Cliche in Treatment: 2 Encounter Discharge Information Items Discharge Condition: Stable Ambulatory Status: Ambulatory Discharge Destination: Home Transportation: Private Auto Accompanied By: self Schedule Follow-up Appointment: Yes Clinical Summary of Care: Patient Declined Electronic Signature(s) Signed: 07/08/2020 7:55:11 AM By: Carlene Coria RN Entered By: Carlene Coria on 07/04/2020 16:20:46 Kennard, [name] ([id number]) -------------------------------------------------------------------------------- Lower Extremity Assessment  Details Patient Name: [name] Date of Service: 07/04/2020 3:00 PM Medical Record Number: [id number] Patient Account Number: [id number] Date of Birth/Sex: May 04, 1965 (55 y.o. F) Treating RN: Donnamarie Poag Primary Care Hong Timm: PATIENT, NO Other Clinician: Jeanine Luz Referring Nikolos Billig: Referral, Self Treating Moriah Shawley/Extender: Skipper Cliche in Treatment: 2 Edema Assessment Assessed: [Left: No] [Right: Yes] [Left: Edema] [Right: :] Calf Left: Right: Point of Measurement: 33 cm From Medial Instep 44 cm Ankle Left: Right: Point of Measurement: 10 cm From Medial Instep 21.5 cm Vascular Assessment Pulses: Dorsalis Pedis Palpable: [Right:Yes] Electronic Signature(s) Signed: 07/04/2020 5:20:48 PM By: Donnamarie Poag Entered By: Donnamarie Poag on 07/04/2020 15:35:05 Mckim, [name] ([id number]) -------------------------------------------------------------------------------- Multi Wound Chart Details Patient Name: [name] Date of Service: 07/04/2020 3:00 PM Medical Record Number: [id number] Patient Account Number: [id number] Date of Birth/Sex: 1966/02/14 (55 y.o. F) Treating RN: Dolan Amen Primary Care Britaney Espaillat: PATIENT, NO Other Clinician: Jeanine Luz Referring Ruey Storer: Referral, Self Treating Shameeka Silliman/Extender: Skipper Cliche in Treatment: 2 Vital Signs Height(in): 67 Pulse(bpm): 84 Weight(lbs): 315 Blood Pressure(mmHg): 146/67 Body Mass Index(BMI): 49 Temperature(F): 98.4 Respiratory Rate(breaths/min): 18 Photos: [N/A:N/A] Wound Location: Right, Lateral Lower Leg N/A N/A Wounding Event: Trauma N/A N/A Primary Etiology: Trauma, Other N/A N/A Comorbid History: Congestive Heart Failure, Coronary N/A N/A Artery Disease, Hypertension, Myocardial Infarction, Peripheral Venous Disease, Type II Diabetes, End Stage Renal Disease, Neuropathy Date Acquired: 05/07/2020 N/A N/A Weeks of Treatment: 2 N/A N/A Wound Status: Open N/A N/A Measurements L x W x  D (cm) 3.5x2.5x0.1 N/A N/A Area (cm) : 6.872 N/A N/A Volume (cm) : 0.687 N/A N/A % Reduction in Area: 97.60% N/A N/A % Reduction in Volume: 97.60% N/A N/A Classification: Partial Thickness N/A N/A Exudate Amount: Large N/A N/A Exudate Type: Serosanguineous N/A N/A Exudate Color: red, brown N/A N/A Wound Margin: Flat and Intact N/A N/A Granulation Amount: Large (67-100%) N/A N/A Granulation Quality: Red N/A N/A Necrotic Amount: Small (1-33%) N/A N/A Exposed Structures: Fat Layer (Subcutaneous Tissue): N/A N/A Yes Fascia: No Tendon: No Muscle: No Joint: No Bone: No Epithelialization: Large (67-100%) N/A N/A Treatment Notes Electronic Signature(s) Signed: 07/04/2020 5:28:26 PM By: Georges Mouse, Minus Breeding RN Entered By: Georges Mouse, Minus Breeding on 07/04/2020 15:51:44 Shimamoto, [place] ([id number]) IYAH, LAGUNA ([id number]) -------------------------------------------------------------------------------- Multi-Disciplinary Care Plan Details Patient Name: [name] Date of Service: 07/04/2020 3:00 PM Medical Record Number: [id number] Patient Account  Number: [id number] Date of Birth/Sex: 09/26/1965 (55 y.o. F) Treating RN: Dolan Amen Primary Care Detravion Tester: PATIENT, NO Other Clinician: Jeanine Luz Referring Demetress Tift: Referral, Self Treating Ruhaan Nordahl/Extender: Skipper Cliche in Treatment: 2 Active Inactive Wound/Skin Impairment Nursing Diagnoses: Impaired tissue integrity Goals: Patient/caregiver will verbalize understanding of skin care regimen Date Initiated: 06/18/2020 Target Resolution Date: 06/18/2020 Goal Status: Active Ulcer/skin breakdown will have a volume reduction of 30% by week 4 Date Initiated: 06/18/2020 Target Resolution Date: 07/19/2020 Goal Status: Active Ulcer/skin breakdown will have a volume reduction of 50% by week 8 Date Initiated: 06/18/2020 Target Resolution Date: 08/18/2020 Goal Status: Active Ulcer/skin breakdown will have a volume  reduction of 80% by week 12 Date Initiated: 06/18/2020 Target Resolution Date: 09/18/2020 Goal Status: Active Interventions: Assess patient/caregiver ability to obtain necessary supplies Assess patient/caregiver ability to perform ulcer/skin care regimen upon admission and as needed Assess ulceration(s) every visit Provide education on ulcer and skin care Treatment Activities: Referred to DME Eviana Sibilia for dressing supplies : 06/18/2020 Skin care regimen initiated : 06/18/2020 Notes: Electronic Signature(s) Signed: 07/04/2020 5:28:26 PM By: Georges Mouse, Minus Breeding RN Entered By: Georges Mouse, Minus Breeding on 07/04/2020 15:51:36 Macnaughton, [name] ([id number]) -------------------------------------------------------------------------------- Pain Assessment Details Patient Name: [name] Date of Service: 07/04/2020 3:00 PM Medical Record Number: [id number] Patient Account Number: [id number] Date of Birth/Sex: 06-27-1965 (56 y.o. F) Treating RN: Donnamarie Poag Primary Care Alisyn Lequire: PATIENT, NO Other Clinician: Jeanine Luz Referring Shadee Rathod: Referral, Self Treating Augustin Bun/Extender: Skipper Cliche in Treatment: 2 Active Problems Location of Pain Severity and Description of Pain Patient Has Paino Yes Site Locations Rate the pain. Current Pain Level: 5 Pain Management and Medication Current Pain Management: Electronic Signature(s) Signed: 07/04/2020 5:20:48 PM By: Donnamarie Poag Entered By: Donnamarie Poag on 07/04/2020 15:27:55 Mantione, [name] ([id number]) -------------------------------------------------------------------------------- Patient/Caregiver Education Details Patient Name: [name] Date of Service: 07/04/2020 3:00 PM Medical Record Number: [id number] Patient Account Number: [id number] Date of Birth/Gender: 20-Jun-1965 (55 y.o. F) Treating RN: Dolan Amen Primary Care Physician: PATIENT, NO Other Clinician: Jeanine Luz Referring Physician: Referral, Self Treating  Physician/Extender: Skipper Cliche in Treatment: 2 Education Assessment Education Provided To: Patient Education Topics Provided Wound/Skin Impairment: Methods: Explain/Verbal Responses: State content correctly Electronic Signature(s) Signed: 07/04/2020 5:28:26 PM By: Georges Mouse, Minus Breeding RN Entered By: Georges Mouse, Minus Breeding on 07/04/2020 15:54:36 Ellenbecker, [name] ([id number]) -------------------------------------------------------------------------------- Wound Assessment Details Patient Name: [name] Date of Service: 07/04/2020 3:00 PM Medical Record Number: [id number] Patient Account Number: [id number] Date of Birth/Sex: Jun 25, 1965 (55 y.o. F) Treating RN: Donnamarie Poag Primary Care Cassanda Walmer: PATIENT, NO Other Clinician: Jeanine Luz Referring Baden Betsch: Referral, Self Treating Kyleeann Cremeans/Extender: Skipper Cliche in Treatment: 2 Wound Status Wound Number: 6 Primary Trauma, Other Etiology: Wound Location: Right, Lateral Lower Leg Wound Open Wounding Event: Trauma Status: Date Acquired: 05/07/2020 Comorbid Congestive Heart Failure, Coronary Artery Disease, Weeks Of Treatment: 2 History: Hypertension, Myocardial Infarction, Peripheral Venous Clustered Wound: No Disease, Type II Diabetes, End Stage Renal Disease, Neuropathy Photos Wound Measurements Length: (cm) 3.5 Width: (cm) 2.5 Depth: (cm) 0.1 Area: (cm) 6.872 Volume: (cm) 0.687 % Reduction in Area: 97.6% % Reduction in Volume: 97.6% Epithelialization: Large (67-100%) Tunneling: No Undermining: No Wound Description Classification: Partial Thickness Wound Margin: Flat and Intact Exudate Amount: Large Exudate Type: Serosanguineous Exudate Color: red, brown Foul Odor After Cleansing: No Slough/Fibrino Yes Wound Bed Granulation Amount: Large (67-100%) Exposed Structure Granulation Quality: Red Fascia Exposed: No Necrotic Amount: Small (1-33%) Fat Layer (Subcutaneous Tissue) Exposed:  Yes Necrotic Quality: Adherent Slough Tendon Exposed: No Muscle  Exposed: No Joint Exposed: No Bone Exposed: No Treatment Notes Wound #6 (Lower Leg) Wound Laterality: Right, Lateral Cleanser Soap and Water Discharge Instruction: Gently cleanse wound with antibacterial soap, rinse and pat dry prior to dressing wounds Kotecki, Aislinn ([id number]) Peri-Wound Care Topical Primary Dressing Curad Oil Emulsion Dressing 3x3 (in/in) Discharge Instruction: Apply on wound bed Secondary Dressing ABD Pad 5x9 (in/in) Discharge Instruction: Cover dressing Secured With Compression Wrap Profore Lite LF 3 Multilayer Compression [place] Discharge Instruction: Apply 3 multi-layer wrap as prescribed. Compression Stockings Add-Ons Electronic Signature(s) Signed: 07/04/2020 5:20:48 PM By: Donnamarie Poag Entered By: Donnamarie Poag on 07/04/2020 15:33:44 Fors, [name] ([id number]) -------------------------------------------------------------------------------- [place] Details Patient Name: [name] Date of Service: 07/04/2020 3:00 PM Medical Record Number: [id number] Patient Account Number: [id number] Date of Birth/Sex: [birth year] (55 y.o. F) Treating RN: Donnamarie Poag Primary Care Eddie Koc: PATIENT, NO Other Clinician: Jeanine Luz Referring Tyshon Fanning: Referral, Self Treating Paizley Ramella/Extender: Skipper Cliche in Treatment: 2 Vital Signs Time Taken: 15:21 Temperature (F): 98.4 Height (in): 67 Pulse (bpm): 84 Weight (lbs): 315 Respiratory Rate (breaths/min): 18 Body Mass Index (BMI): 49.3 Blood Pressure (mmHg): 146/67 Reference Range: 80 - 120 mg / dl Electronic Signature(s) Signed: 07/04/2020 5:20:48 PM By: Donnamarie Poag Entered ByDonnamarie Poag on 07/04/2020 15:27:34

## 2020-07-09 ENCOUNTER — Other Ambulatory Visit: Payer: Self-pay

## 2020-07-09 ENCOUNTER — Encounter: Payer: Medicare Other | Admitting: Physician Assistant

## 2020-07-09 DIAGNOSIS — I872 Venous insufficiency (chronic) (peripheral): Secondary | ICD-10-CM | POA: Diagnosis not present

## 2020-07-09 NOTE — Progress Notes (Addendum)
ALLISA, EINSPAHR ([id number]) Visit Report for 07/09/2020 Chief Complaint Document Details Patient Name: [name], [name] Date of Service: 07/09/2020 3:00 PM Medical Record Number: [id number] Patient Account Number: [id number] Date of Birth/Sex: 09/07/1965 (55 y.o. F) Treating RN: Dolan Amen Primary Care Provider: PATIENT, NO Other Clinician: Referring Provider: Referral, Self Treating Provider/Extender: Skipper Cliche in Treatment: 3 Information Obtained from: Patient Chief Complaint Right LE Ulcer Electronic Signature(s) Signed: 07/09/2020 3:43:08 PM By: Worthy Keeler PA-C Entered By: Worthy Keeler on 07/09/2020 15:43:08 Schmiesing, [name] ([id number]) -------------------------------------------------------------------------------- HPI Details Patient Name: [name] Date of Service: 07/09/2020 3:00 PM Medical Record Number: [id number] Patient Account Number: [id number] Date of Birth/Sex: [birth year] (55 y.o. F) Treating RN: Dolan Amen Primary Care Provider: PATIENT, NO Other Clinician: Referring Provider: Referral, Self Treating Provider/Extender: Skipper Cliche in Treatment: 3 History of Present Illness HPI Description: 10/24/2018 on evaluation today patient presents for initial evaluation or clinic concerning issues that she has been having with lymphedema for quite some time. Unfortunately she has several wound openings at this point that secondary to her lymphedema/venous stasis are giving her trouble and leaking quite severely. She also has diabetes along with hypertension and stage III chronic kidney disease. Fortunately there is no signs of active infection at this time. She is going to likely require debridement of the left leg ulcer upon evaluation today just based on what I am seeing. Fortunately there is no wound opening on the right. Overall the patient seems to be doing quite well and again there is no evidence of systemic infection which is good news. No  fevers, chills, nausea, vomiting, or diarrhea. 10/31/2018 on evaluation today patient actually appears to be doing excellent in regard to her lower extremity ulcers. She has been tolerating the dressing changes without complication. Fortunately there is no signs of active infection at this time. She has tolerated 3 layer compression wrap without complication. 11/07/2018 upon evaluation today patient actually appears to be doing very well with regard to her left lower extremity ulcers. She has been tolerating the dressing changes without complication. Fortunately there is no signs of active infection. No fevers, chills, nausea, vomiting, or diarrhea. 11/21/2018 upon evaluation today patient appears to be doing quite well with regard to her lower extremity ulcers. In fact both areas seem to be showing signs of good improvement which is excellent. She is not having as much pain as she has in the past and again has a lot of healing compared to previous visits as well. 12/05/2018 on evaluation today patient presents for follow-up concerning her ongoing issues with her bilateral lower extremity ulcers. The good news is her right lower extremity is showing signs of completely healing at this time which is great news. Fortunately there is no evidence of active infection. On the left she has just a very small area still remaining that is open at this time all in all she is very close to complete closure in my opinion. She does have compression stockings to wear at home. 12/12/2018 on evaluation today patient appears to be doing excellent in regard to her left lateral lower extremity ulcer. She has been tolerating the dressing changes without complication. Fortunately there is no signs of active infection at this time. In fact this appears to be pretty much healed at this point although again she is not 100% today. No fevers, chills, nausea, vomiting, or diarrhea. 12/19/2018 on evaluation today patient actually  appears to be doing excellent with regard to her lower extremity ulcer  in fact this appears to be completely healed today which is all some. She has done extremely well with wound care measures. No fevers, chills, nausea, vomiting, or diarrhea. ------------------------------------ 11/01/19-Readmission to the clinic Patient presents with left leg pain, wounds posterior calf, onset about 4 weeks, denies any fevers chills or shakes, has not been using anything to these wounds, was given compression stockings at last discharge from the clinic but has not been able to use them as they rolled down and cause creases Patient's history significant for type 2 diabetes insulin requiring, A1c of 6.9 lately, hypertension, chronic pain 8/18; patient readmitted that the clinic last week. She has wounds on her left lateral posterior lower leg all of this in close juxtaposition i.e. a localized site. We've been using silver alginate under 3 layer compression apparently the wound surface area is better. She was wearing compression stockings from elastic therapy but says they were falling down. As she progresses more towards healing will need to address what we use in terms of compression stockings perhaps external compression garments 11/17/2019 on evaluation today patient appears to be doing well with regard to her legs currently. She does have 2 wounds which are actually measuring much better the more posterior is actually doing very well which I am pleased with the other though smaller is not quite a small but nonetheless on the lateral aspect does seem to be improving. 11/23/2019 on evaluation today patient appears to be doing well in regard to her wounds. In fact the wound on the posterior aspect of her leg appears healed the lateral aspect is still open but extremely small. 9/15; this is a patient with chronic venous insufficiency and secondary lymphedema. She did not keep her clinic appointment last week and  hence the left leg is a lot more swollen since she had to take the wrap off at some point. She has a small weeping area on the left lateral lower leg. She has probably a juxta lite stocking for the left leg I have asked her to bring that in when she comes to her clinic appointment next week at which time she should be healed 10/6; this is a patient with chronic venous insufficiency and secondary lymphedema left greater than right. Skin changes of chronic lymphedema especially in the left leg. She has not been here in 3 weeks. She took the wrap off 2 weeks ago. She has some very old 20/30 stockings from elastic therapy in [place] she has been using. Fortunately her leg is closed. She also has a single Farrow wrap for the left leg Readmission: 06/18/2020 upon evaluation today patient presents for reevaluation here in the clinic. She is a long-term patient that we have seen intermittently when she has had flareups of issues with her lower extremity secondary to chronic venous stasis. With that being said unfortunately today she is having a significant issue with a wound which is actually quite significant over the right lower extremity. She did see dermatology they put Vaseline followed by Telfa island dressings on her unfortunately she is continuing to have significant issues however here with pain and in fact this wrap was extremely stuck to her upon evaluation today. Nonetheless I believe that she is going to require a little bit different approach to try to keep things from sticking and hopefully aid in getting this to heal more effectively and quickly. With that being said she does have a history of [name], [name] ([id number]) chronic venous insufficiency, lymphedema, diabetes mellitus type 2,  chronic kidney disease stage III, hypertension, and congestive heart failure. This started as an injury in mid February and has worsened over the past 2 weeks. 07/04/2020 upon evaluation today patient appears  to be doing well with regard to her wound on the leg. Overall I am extremely pleased with where things stand and I do think that she is making excellent progress. There is no sign of infection right now which is also great news. I think that she is close to complete resolution. 07/09/20 upon evaluation today patient appears to be doing excellent in regard to her leg ulcer. She has been tolerating the dressing changes without complication and in general I am extremely pleased with where things stand today. There does not appear to be any signs of active infection at this time which is great news. Electronic Signature(s) Signed: 07/09/2020 5:47:36 PM By: Worthy Keeler PA-C Entered By: Worthy Keeler on 07/09/2020 17:47:36 [name], [name] ([id number]) -------------------------------------------------------------------------------- Physical Exam Details Patient Name: [name] Date of Service: 07/09/2020 3:00 PM Medical Record Number: [id number] Patient Account Number: [id number] Date of Birth/Sex: 1965-11-13 (55 y.o. F) Treating RN: Dolan Amen Primary Care Provider: PATIENT, NO Other Clinician: Referring Provider: Referral, Self Treating Provider/Extender: Skipper Cliche in Treatment: 3 Constitutional Well-nourished and well-hydrated in no acute distress. Respiratory normal breathing without difficulty. Psychiatric this patient is able to make decisions and demonstrates good insight into disease process. Alert and Oriented x 3. pleasant and cooperative. Notes Patient's wound bed showed signs of good granulation and epithelization at this point. I am very pleased with how she seems to be progressing. I do not see any evidence of infection which is great news and overall I think she is managing quite nicely. Electronic Signature(s) Signed: 07/09/2020 5:47:50 PM By: Worthy Keeler PA-C Entered By: Worthy Keeler on 07/09/2020 17:47:49 [name], [name]  ([id number]) -------------------------------------------------------------------------------- Physician Orders Details Patient Name: [name] Date of Service: 07/09/2020 3:00 PM Medical Record Number: [id number] Patient Account Number: [id number] Date of Birth/Sex: 09-10-1965 (55 y.o. F) Treating RN: Dolan Amen Primary Care Provider: PATIENT, NO Other Clinician: Referring Provider: Referral, Self Treating Provider/Extender: Skipper Cliche in Treatment: 3 Verbal / Phone Orders: No Diagnosis Coding ICD-10 Coding Code Description I87.2 Venous insufficiency (chronic) (peripheral) I89.0 Lymphedema, not elsewhere classified L97.811 Non-pressure chronic ulcer of other part of right lower leg limited to breakdown of skin E11.622 Type 2 diabetes mellitus with other skin ulcer N18.30 Chronic kidney disease, stage 3 unspecified I10 Essential (primary) hypertension I50.42 Chronic combined systolic (congestive) and diastolic (congestive) heart failure Follow-up Appointments o Return Appointment in 2 weeks. o Nurse Visit as needed - Next week Bathing/ Shower/ Hygiene Wound #6 Right,Lateral Lower Leg o May shower with wound dressing protected with water repellent cover or cast protector. Edema Control - Lymphedema / Segmental Compressive Device / Other o Optional: One layer of unna paste to top of compression wrap (to act as an anchor). o 3 Layer Compression System for Lymphedema. Wound Treatment Wound #6 - Lower Leg Wound Laterality: Right, Lateral Cleanser: Soap and Water 1 x Per Week/30 Days Discharge Instructions: Gently cleanse wound with antibacterial soap, rinse and pat dry prior to dressing wounds Peri-Wound Care: Moisturizing Lotion 1 x Per Week/30 Days Discharge Instructions: Suggestions: Theraderm, Eucerin, Cetaphil, or patient preference. Primary Dressing: Curad Oil Emulsion Dressing 3x3 (in/in) 1 x Per Week/30 Days Discharge Instructions: Apply on wound  bed Secondary Dressing: ABD Pad 5x9 (in/in) 1 x Per Week/30 Days Discharge Instructions: Cover dressing  Compression Wrap: Profore Lite LF 3 Multilayer Compression Bandaging System 1 x Per Week/30 Days Discharge Instructions: Apply 3 multi-layer wrap as prescribed. Electronic Signature(s) Signed: 07/09/2020 5:05:23 PM By: Georges Mouse, Minus Breeding RN Signed: 07/10/2020 12:58:56 PM By: Worthy Keeler PA-C Entered By: Georges Mouse, Minus Breeding on 07/09/2020 16:21:22 [name], [name] ([id number]) -------------------------------------------------------------------------------- Problem List Details Patient Name: [name] Date of Service: 07/09/2020 3:00 PM Medical Record Number: [id number] Patient Account Number: [id number] Date of Birth/Sex: [birth year] (55 y.o. F) Treating RN: Dolan Amen Primary Care Provider: PATIENT, NO Other Clinician: Referring Provider: Referral, Self Treating Provider/Extender: Skipper Cliche in Treatment: 3 Active Problems ICD-10 Encounter Code Description Active Date MDM Diagnosis I87.2 Venous insufficiency (chronic) (peripheral) 06/18/2020 No Yes I89.0 Lymphedema, not elsewhere classified 06/18/2020 No Yes L97.811 Non-pressure chronic ulcer of other part of right lower leg limited to 06/18/2020 No Yes breakdown of skin E11.622 Type 2 diabetes mellitus with other skin ulcer 06/18/2020 No Yes N18.30 Chronic kidney disease, stage 3 unspecified 06/18/2020 No Yes I10 Essential (primary) hypertension 06/18/2020 No Yes I50.42 Chronic combined systolic (congestive) and diastolic (congestive) heart 06/18/2020 No Yes failure Inactive Problems Resolved Problems Electronic Signature(s) Signed: 07/09/2020 3:43:02 PM By: Worthy Keeler PA-C Entered By: Worthy Keeler on 07/09/2020 15:43:02 [name], [name] ([id number]) -------------------------------------------------------------------------------- Progress Note Details Patient Name: [name] Date of Service:  07/09/2020 3:00 PM Medical Record Number: [id number] Patient Account Number: [id number] Date of Birth/Sex: [birth year] (55 y.o. F) Treating RN: Dolan Amen Primary Care Provider: PATIENT, NO Other Clinician: Referring Provider: Referral, Self Treating Provider/Extender: Skipper Cliche in Treatment: 3 Subjective Chief Complaint Information obtained from Patient Right LE Ulcer History of Present Illness (HPI) 10/24/2018 on evaluation today patient presents for initial evaluation or clinic concerning issues that she has been having with lymphedema for quite some time. Unfortunately she has several wound openings at this point that secondary to her lymphedema/venous stasis are giving her trouble and leaking quite severely. She also has diabetes along with hypertension and stage III chronic kidney disease. Fortunately there is no signs of active infection at this time. She is going to likely require debridement of the left leg ulcer upon evaluation today just based on what I am seeing. Fortunately there is no wound opening on the right. Overall the patient seems to be doing quite well and again there is no evidence of systemic infection which is good news. No fevers, chills, nausea, vomiting, or diarrhea. 10/31/2018 on evaluation today patient actually appears to be doing excellent in regard to her lower extremity ulcers. She has been tolerating the dressing changes without complication. Fortunately there is no signs of active infection at this time. She has tolerated 3 layer compression wrap without complication. 11/07/2018 upon evaluation today patient actually appears to be doing very well with regard to her left lower extremity ulcers. She has been tolerating the dressing changes without complication. Fortunately there is no signs of active infection. No fevers, chills, nausea, vomiting, or diarrhea. 11/21/2018 upon evaluation today patient appears to be doing quite well with regard to her lower  extremity ulcers. In fact both areas seem to be showing signs of good improvement which is excellent. She is not having as much pain as she has in the past and again has a lot of healing compared to previous visits as well. 12/05/2018 on evaluation today patient presents for follow-up concerning her ongoing issues with her bilateral lower extremity ulcers. The good news is her right lower extremity is showing signs of completely  healing at this time which is great news. Fortunately there is no evidence of active infection. On the left she has just a very small area still remaining that is open at this time all in all she is very close to complete closure in my opinion. She does have compression stockings to wear at home. 12/12/2018 on evaluation today patient appears to be doing excellent in regard to her left lateral lower extremity ulcer. She has been tolerating the dressing changes without complication. Fortunately there is no signs of active infection at this time. In fact this appears to be pretty much healed at this point although again she is not 100% today. No fevers, chills, nausea, vomiting, or diarrhea. 12/19/2018 on evaluation today patient actually appears to be doing excellent with regard to her lower extremity ulcer in fact this appears to be completely healed today which is all some. She has done extremely well with wound care measures. No fevers, chills, nausea, vomiting, or diarrhea. ------------------------------------ 11/01/19-Readmission to the clinic Patient presents with left leg pain, wounds posterior calf, onset about 4 weeks, denies any fevers chills or shakes, has not been using anything to these wounds, was given compression stockings at last discharge from the clinic but has not been able to use them as they rolled down and cause creases Patient's history significant for type 2 diabetes insulin requiring, A1c of 6.9 lately, hypertension, chronic pain 8/18; patient  readmitted that the clinic last week. She has wounds on her left lateral posterior lower leg all of this in close juxtaposition i.e. a localized site. We've been using silver alginate under 3 layer compression apparently the wound surface area is better. She was wearing compression stockings from elastic therapy but says they were falling down. As she progresses more towards healing will need to address what we use in terms of compression stockings perhaps external compression garments 11/17/2019 on evaluation today patient appears to be doing well with regard to her legs currently. She does have 2 wounds which are actually measuring much better the more posterior is actually doing very well which I am pleased with the other though smaller is not quite a small but nonetheless on the lateral aspect does seem to be improving. 11/23/2019 on evaluation today patient appears to be doing well in regard to her wounds. In fact the wound on the posterior aspect of her leg appears healed the lateral aspect is still open but extremely small. 9/15; this is a patient with chronic venous insufficiency and secondary lymphedema. She did not keep her clinic appointment last week and hence the left leg is a lot more swollen since she had to take the wrap off at some point. She has a small weeping area on the left lateral lower leg. She has probably a juxta lite stocking for the left leg I have asked her to bring that in when she comes to her clinic appointment next week at which time she should be healed 10/6; this is a patient with chronic venous insufficiency and secondary lymphedema left greater than right. Skin changes of chronic lymphedema especially in the left leg. She has not been here in 3 weeks. She took the wrap off 2 weeks ago. She has some very old 20/30 stockings from elastic therapy in [place] she has been using. Fortunately her leg is closed. She also has a single Farrow wrap for the left  leg Readmission: 06/18/2020 upon evaluation today patient presents for reevaluation here in the clinic. She is a long-term  patient that we have seen intermittently [name], [name] ([id number]) when she has had flareups of issues with her lower extremity secondary to chronic venous stasis. With that being said unfortunately today she is having a significant issue with a wound which is actually quite significant over the right lower extremity. She did see dermatology they put Vaseline followed by T[place]a island dressings on her unfortunately she is continuing to have significant issues however here with pain and in fact this wrap was extremely stuck to her upon evaluation today. Nonetheless I believe that she is going to require a little bit different approach to try to keep things from sticking and hopefully aid in getting this to heal more effectively and quickly. With that being said she does have a history of chronic venous insufficiency, lymphedema, diabetes mellitus type 2, chronic kidney disease stage III, hypertension, and congestive heart failure. This started as an injury in mid February and has worsened over the past 2 weeks. 07/04/2020 upon evaluation today patient appears to be doing well with regard to her wound on the leg. Overall I am extremely pleased with where things stand and I do think that she is making excellent progress. There is no sign of infection right now which is also great news. I think that she is close to complete resolution. 07/09/20 upon evaluation today patient appears to be doing excellent in regard to her leg ulcer. She has been tolerating the dressing changes without complication and in general I am extremely pleased with where things stand today. There does not appear to be any signs of active infection at this time which is great news. Objective Constitutional Well-nourished and well-hydrated in no acute distress. Vitals Time Taken: 3:19 PM, Height: 67 in, Weight:  315 lbs, BMI: 49.3, Temperature: 98.7 F, Pulse: 82 bpm, Respiratory Rate: 18 breaths/min, Blood Pressure: 139/62 mmHg. Respiratory normal breathing without difficulty. Psychiatric this patient is able to make decisions and demonstrates good insight into disease process. Alert and Oriented x 3. pleasant and cooperative. General Notes: Patient's wound bed showed signs of good granulation and epithelization at this point. I am very pleased with how she seems to be progressing. I do not see any evidence of infection which is great news and overall I think she is managing quite nicely. Integumentary (Hair, Skin) Wound #6 status is Open. Original cause of wound was Trauma. The date acquired was: 05/07/2020. The wound has been in treatment 3 weeks. The wound is located on the Right,Lateral Lower Leg. The wound measures 4cm length x 2cm width x 0.1cm depth; 6.283cm^2 area and 0.628cm^3 volume. There is Fat Layer (Subcutaneous Tissue) exposed. There is no tunneling or undermining noted. There is a medium amount of serosanguineous drainage noted. The wound margin is flat and intact. There is large (67-100%) red granulation within the wound bed. There is a small (1-33%) amount of necrotic tissue within the wound bed including Adherent Slough. 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 There was a Three Layer Compression Therapy Upson, Zyanne ([id number]) Procedure by Dolan Amen, RN. Post procedure Diagnosis Wound #6:  Same as Pre-Procedure Notes: pt tolerating wrap  well. Plan Follow-up Appointments: Return Appointment in 2 weeks. Nurse Visit as needed - Next week Bathing/ Shower/ Hygiene: Wound #6 Right,Lateral Lower Leg: May shower with wound dressing protected with water repellent cover or cast protector. Edema Control - Lymphedema / Segmental Compressive Device / Other: Optional: One layer of unna paste to top of compression wrap (to act as an anchor). 3 Layer Compression System for Lymphedema. WOUND #6: - Lower Leg Wound Laterality: Right, Lateral Cleanser: Soap and Water 1 x Per Week/30 Days Discharge Instructions: Gently cleanse wound with antibacterial soap, rinse and pat dry prior to dressing wounds Peri-Wound Care: Moisturizing Lotion 1 x Per Week/30 Days Discharge Instructions: Suggestions: Theraderm, Eucerin, Cetaphil, or patient preference. Primary Dressing: Curad Oil Emulsion Dressing 3x3 (in/in) 1 x Per Week/30 Days Discharge Instructions: Apply on wound bed Secondary Dressing: ABD Pad 5x9 (in/in) 1 x Per Week/30 Days Discharge Instructions: Cover dressing Compression Wrap: Profore Lite LF 3 Multilayer Compression Bandaging System 1 x Per Week/30 Days Discharge Instructions: Apply 3 multi-layer wrap as prescribed. 1. Would recommend currently that we going continue with the wound care measures as before with using the oil emulsion dressing which I think is appropriate. This is keeping everything from sticking to the wound bed. 2. Muscle can recommend an ABD pad cover followed by 3 layer compression wrap. 3. I am also going to suggest the patient continue to elevate her leg as much as possible to help keep edema under good control. We will see patient back for reevaluation in 2 weeks here in the clinic. If anything worsens or changes patient will contact our office for additional recommendations. We will see her in 1 week for nurse visit. Electronic Signature(s) Signed: 07/09/2020  5:48:23 PM By: Worthy Keeler PA-C Entered By: Worthy Keeler on 07/09/2020 17:48:23 [name], [name] ([id number]) -------------------------------------------------------------------------------- SuperBill Details Patient Name: [name] Date of Service: 07/09/2020 Medical Record Number: [id number] Patient Account Number: [id number] Date of Birth/Sex: 12/29/1965 (55 y.o. 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.622 Type 2 diabetes mellitus with other skin ulcer N18.30 Chronic kidney disease, stage 3 unspecified I10 Essential (primary) hypertension I50.42 Chronic combined systolic (congestive) and diastolic (congestive) heart failure Facility Procedures CPT4 Code: 54562563 Description: (Facility Use Only) [phone] - [place] RT LEG Modifier: Quantity: 1 Physician Procedures CPT4 Code: 8768115 Description: 72620 - WC PHYS LEVEL 3 - EST PT Modifier: Quantity: 1 CPT4 Code: Description: ICD-10 Diagnosis Description I87.2 Venous insufficiency (chronic) (peripheral) I89.0 Lymphedema, not elsewhere classified 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

## 2020-07-12 NOTE — Telephone Encounter (Addendum)
Call to patient to obtain status of sleep study.  Pt states she "forgot."  Pt is going to write a note to herself and take the study within the next few days.  She confirms understanding of instructions.

## 2020-07-16 ENCOUNTER — Other Ambulatory Visit: Payer: Self-pay

## 2020-07-16 DIAGNOSIS — I872 Venous insufficiency (chronic) (peripheral): Secondary | ICD-10-CM | POA: Diagnosis not present

## 2020-07-17 NOTE — Progress Notes (Signed)
[name], [name] ([id number]) Visit Report for 07/09/2020 Arrival Information Details Patient Name: [name] Date of Service: 07/09/2020 3:00 PM Medical Record Number: [id number] Patient Account Number: [id number] Date of Birth/Sex: October 29, 1965 (55 y.o. F) Treating [name]: [name] Primary Care Cash Duce: PATIENT, NO Other Clinician: Referring [name]: Referral, Self Treating [name]: [name]: 3 Visit Information History Since Last Visit All ordered tests and consults were completed: No Patient Arrived: Ambulatory Added or deleted any medications: No Arrival Time: 15:18 Any new allergies or adverse reactions: No Accompanied By: self Had a fall or experienced change in No Transfer Assistance: None activities of daily living that may affect Patient Identification Verified: Yes risk of falls: Secondary Verification Process Completed: Yes Signs or symptoms of abuse/neglect since last visito No Patient Requires Transmission-Based No Hospitalized since last visit: No Precautions: Implantable device outside of the clinic excluding No Patient Has Alerts: Yes cellular tissue based products placed in the center Patient Alerts: Patient on Blood since last visit: Thinner Has Dressing in Place as Prescribed: Yes DIABETIC Has Compression in Place as Prescribed: Yes Pain Present Now: No Electronic Signature(s) Signed: 07/17/2020 3:51:10 PM By: [name] Coria [name] Entered By: [name] 15:19:12 [place], [facility] ([id number]) -------------------------------------------------------------------------------- Clinic Level of Care Assessment Details Patient Name: [name], [name] Date of Service: 07/09/2020 3:00 PM Medical Record Number: [id number] Patient Account Number: [id number] Date of Birth/Sex: [birth year] (55 y.o. F) Treating [name]: [name] Primary Care Dana Debo: PATIENT, NO Other Clinician: Referring Chanice Brenton: Referral, Self Treating  [name]: [name]: 3 Clinic Level of Care Assessment Items TOOL 1 Quantity Score []  - Use when EandM and Procedure is performed on INITIAL visit 0 ASSESSMENTS - Nursing Assessment / Reassessment []  - General Physical Exam (combine w/ comprehensive assessment (listed just below) when performed on new 0 pt. evals) []  - 0 Comprehensive Assessment (HX, ROS, Risk Assessments, Wounds Hx, etc.) ASSESSMENTS - Wound and Skin Assessment / Reassessment []  - Dermatologic / Skin Assessment (not related to wound area) 0 ASSESSMENTS - Ostomy and/or Continence Assessment and Care []  - Incontinence Assessment and Management 0 []  - 0 Ostomy Care Assessment and Management (repouching, etc.) PROCESS - Coordination of Care []  - Simple Patient / Family Education for ongoing care 0 []  - 0 Complex (extensive) Patient / Family Education for ongoing care []  - 0 Staff obtains Programmer, systems, Records, Test Results / Process Orders []  - 0 Staff telephones HHA, Nursing Homes / Clarify orders / etc []  - 0 Routine Transfer to another Facility (non-emergent condition) []  - 0 Routine Hospital Admission (non-emergent condition) []  - 0 New Admissions / Biomedical engineer / Ordering NPWT, Apligraf, etc. []  - 0 Emergency Hospital Admission (emergent condition) PROCESS - Special Needs []  - Pediatric / Minor Patient Management 0 []  - 0 Isolation Patient Management []  - 0 Hearing / Language / Visual special needs []  - 0 Assessment of Community assistance (transportation, D/C planning, etc.) []  - 0 Additional assistance / Altered mentation []  - 0 Support Surface(s) Assessment (bed, cushion, seat, etc.) INTERVENTIONS - Miscellaneous []  - External ear exam 0 []  - 0 Patient Transfer (multiple staff / Civil Service fast streamer / Similar devices) []  - 0 Simple Staple / Suture removal (25 or less) []  - 0 Complex Staple / Suture removal (26 or more) []  - 0 Hypo/Hyperglycemic Management (do not  check if billed separately) []  - 0 Ankle / Brachial Index (ABI) - do not check if billed separately Has the patient been seen at the hospital  within the last three years: Yes Total Score: 0 Level Of Care: ____ [name] ([id number]) Electronic Signature(s) Signed: 07/09/2020 5:05:23 PM By: [name] Entered By: [name], [name] Breeding on [name] 16:21:27 Rumple, [name] ([id number]) -------------------------------------------------------------------------------- Compression Therapy Details Patient Name: [name] Date of Service: 07/09/2020 3:00 PM Medical Record Number: [id number] Patient Account Number: [id number] Date of Birth/Sex: 02-Mar-1966 (55 y.o. F) Treating [name]: [name] Primary Care [name]: PATIENT, NO Other Clinician: Referring [name]: Referral, Self Treating [name]: [name]: 3 Compression Therapy Performed for Wound Assessment: Wound #6 Right,Lateral Lower Leg Performed By: Clinician [name] Amen, [name] Compression Type: Three Layer Post Procedure Diagnosis Same as Pre-procedure Notes pt tolerating wrap well Electronic Signature(s) Signed: 07/09/2020 5:05:23 PM By: [name] Entered By: [name], [name] Breeding on [name] 16:20:21 Bartolotta, [name] ([id number]) -------------------------------------------------------------------------------- Lower Extremity Assessment Details Patient Name: [name] Date of Service: 07/09/2020 3:00 PM Medical Record Number: [id number] Patient Account Number: [id number] Date of Birth/Sex: 26-Jul-1965 (55 y.o. F) Treating [name]: [name] Primary Care [name]: PATIENT, NO Other Clinician: Referring [name]: Referral, Self Treating [name]: [name]: 3 Edema Assessment Assessed: [Left: No] [Right: No] Edema: [Left: Ye] [Right: s] Calf Left: Right: Point of Measurement: 33 cm From Medial Instep 40 cm Ankle Left:  Right: Point of Measurement: 10 cm From Medial Instep 20 cm Vascular Assessment Pulses: Dorsalis Pedis Palpable: [Right:Yes] Electronic Signature(s) Signed: 07/17/2020 3:51:10 PM By: [name] Coria [name] Entered By: [name] 15:31:54 [name], [name] ([id number]) -------------------------------------------------------------------------------- Multi Wound Chart Details Patient Name: [name] Date of Service: 07/09/2020 3:00 PM Medical Record Number: [id number] Patient Account Number: [id number] Date of Birth/Sex: 21-May-1965 (55 y.o. F) Treating [name]: [name] Primary Care Stephanieann Popescu: PATIENT, NO Other Clinician: Referring Britani Beattie: Referral, Self Treating Xayvier Vallez/Extender: [name]: 3 Vital Signs Height(in): 67 Pulse(bpm): 13 Weight(lbs): 315 Blood Pressure(mmHg): 139/62 Body Mass Index(BMI): 49 Temperature(F): 98.7 Respiratory Rate(breaths/min): 18 Photos: [N/A:N/A] Wound Location: Right, Lateral Lower Leg N/A N/A Wounding Event: Trauma N/A N/A Primary Etiology: Trauma, Other N/A N/A Comorbid History: Congestive Heart Failure, Coronary N/A N/A Artery Disease, Hypertension, Myocardial Infarction, Peripheral Venous Disease, Type II Diabetes, End Stage Renal Disease, Neuropathy Date Acquired: 05/07/2020 N/A N/A Weeks of [name]: 3 N/A N/A Wound Status: Open N/A N/A Measurements L x W x D (cm) 4x2x0.1 N/A N/A Area (cm) : 6.283 N/A N/A Volume (cm) : 0.628 N/A N/A % Reduction in Area: 97.80% N/A N/A % Reduction in Volume: 97.80% N/A N/A Classification: Partial Thickness N/A N/A Exudate Amount: Medium N/A N/A Exudate Type: Serosanguineous N/A N/A Exudate Color: red, brown N/A N/A Wound Margin: Flat and Intact N/A N/A Granulation Amount: Large (67-100%) N/A N/A Granulation Quality: Red N/A N/A Necrotic Amount: Small (1-33%) N/A N/A Exposed Structures: Fat Layer (Subcutaneous Tissue): N/A N/A Yes Fascia: No Tendon:  No Muscle: No Joint: No Bone: No Epithelialization: Large (67-100%) N/A N/A [name] Notes Electronic Signature(s) Signed: 07/09/2020 5:05:23 PM By: [name] Entered By: [name], [name] Breeding on [name] 16:19:44 Martis, [name] ([id number]) [name], [name] ([id number]) -------------------------------------------------------------------------------- Multi-Disciplinary Care Plan Details Patient Name: [name] Date of Service: 07/09/2020 3:00 PM Medical Record Number: [id number] Patient Account Number: [id number] Date of Birth/Sex: 1965/07/08 (55 y.o. F) Treating [name]: [name] Primary Care Aran Menning: PATIENT, NO Other Clinician: Referring [place] Kuang: Referral, Self Treating Narjis Mira/Extender: [name]: 3 Active Inactive Wound/Skin Impairment Nursing Diagnoses: Impaired tissue integrity Goals: Patient/caregiver will verbalize understanding of skin care  regimen Date Initiated: 06/18/2020 Date Inactivated: 07/09/2020 Target Resolution Date: 06/18/2020 Goal Status: Met Ulcer/skin breakdown will have a volume reduction of 30% by week 4 Date Initiated: 06/18/2020 Date Inactivated: 07/09/2020 Target Resolution Date: 07/19/2020 Goal Status: Met Ulcer/skin breakdown will have a volume reduction of 50% by week 8 Date Initiated: 06/18/2020 Date Inactivated: 07/09/2020 Target Resolution Date: 08/18/2020 Goal Status: Met Ulcer/skin breakdown will have a volume reduction of 80% by week 12 Date Initiated: 06/18/2020 Target Resolution Date: 09/18/2020 Goal Status: Active Interventions: Assess patient/caregiver ability to obtain necessary supplies Assess patient/caregiver ability to perform ulcer/skin care regimen upon admission and as needed Assess ulceration(s) every visit Provide education on ulcer and skin care [name] Activities: Referred to DME Maleik Vanderzee for dressing supplies : 06/18/2020 Skin care regimen initiated :  06/18/2020 Notes: Electronic Signature(s) Signed: 07/09/2020 5:05:23 PM By: [name] Entered By: [name], [name] Breeding on [name] 16:19:33 Nicasio, [name] ([id number]) -------------------------------------------------------------------------------- Pain Assessment Details Patient Name: [name] Date of Service: 07/09/2020 3:00 PM Medical Record Number: [id number] Patient Account Number: [id number] Date of Birth/Sex: September 05, 1965 (55 y.o. F) Treating [name]: [name] Primary Care Minyon Billiter: PATIENT, NO Other Clinician: Referring Giulietta Prokop: Referral, Self Treating Jamese Trauger/Extender: [name]: 3 Active Problems Location of Pain Severity and Description of Pain Patient Has Paino No Site Locations Pain Management and Medication Current Pain Management: Electronic Signature(s) Signed: 07/17/2020 3:51:10 PM By: [name] Coria [name] Entered By: [name] 15:20:32 [name], [name] ([id number]) -------------------------------------------------------------------------------- Patient/Caregiver Education Details Patient Name: [name] Date of Service: 07/09/2020 3:00 PM Medical Record Number: [id number] Patient Account Number: [id number] Date of Birth/Gender: 1965-08-08 (55 y.o. F) Treating [name]: [name] Primary Care Physician: PATIENT, NO Other Clinician: Referring Physician: Referral, Self Treating Physician/Extender: [name]: 3 Education Assessment Education Provided To: Patient Education Topics Provided Wound/Skin Impairment: Methods: Explain/Verbal Responses: State content correctly Electronic Signature(s) Signed: 07/09/2020 5:05:23 PM By: [name] Entered By: [name], [name] Breeding on [name] 16:21:56 [name], [name] ([id number]) -------------------------------------------------------------------------------- Wound Assessment Details Patient Name: [name] Date of  Service: 07/09/2020 3:00 PM Medical Record Number: [id number] Patient Account Number: [id number] Date of Birth/Sex: 09/08/1965 (55 y.o. F) Treating [name]: [name] Primary Care Jorden Mahl: PATIENT, NO Other Clinician: Referring Ayjah Show: Referral, Self Treating Frankey Botting/Extender: [name]: 3 Wound Status Wound Number: 6 Primary Trauma, Other Etiology: Wound Location: Right, Lateral Lower Leg Wound Open Wounding Event: Trauma Status: Date Acquired: 05/07/2020 Comorbid Congestive Heart Failure, Coronary Artery Disease, Weeks Of [name]: 3 History: Hypertension, Myocardial Infarction, Peripheral Venous Clustered Wound: No Disease, Type II Diabetes, End Stage Renal Disease, Neuropathy Photos Wound Measurements Length: (cm) 4 Width: (cm) 2 Depth: (cm) 0.1 Area: (cm) 6.283 Volume: (cm) 0.628 % Reduction in Area: 97.8% % Reduction in Volume: 97.8% Epithelialization: Large (67-100%) Tunneling: No Undermining: No Wound Description Classification: Partial Thickness Wound Margin: Flat and Intact Exudate Amount: Medium Exudate Type: Serosanguineous Exudate Color: red, brown Foul Odor After Cleansing: No Slough/Fibrino Yes Wound Bed Granulation Amount: Large (67-100%) Exposed Structure Granulation Quality: Red Fascia Exposed: No Necrotic Amount: Small (1-33%) Fat Layer (Subcutaneous Tissue) Exposed: Yes Necrotic Quality: Adherent Slough Tendon Exposed: No Muscle Exposed: No Joint Exposed: No Bone Exposed: No Electronic Signature(s) Signed: 07/17/2020 3:51:10 PM By: [name] Coria [name] Entered By: [name] 15:31:22 [name], [name] ([id number]) -------------------------------------------------------------------------------- Vitals Details Patient Name: [name] Date of Service: 07/09/2020 3:00 PM Medical Record Number: [id number] Patient Account Number: [id number] Date of Birth/Sex: [birth year] (54 y.o.  F) Treating [name]: [name] Primary Care Jeanetta Alonzo: PATIENT, NO Other Clinician: Referring Mikayela Deats: Referral, Self Treating Ellis Koffler/Extender: [name]: 3 Vital Signs Time Taken: 15:19 Temperature (F): 98.7 Height (in): 67 Pulse (bpm): 82 Weight (lbs): 315 Respiratory Rate (breaths/min): 18 Body Mass Index (BMI): 49.3 Blood Pressure (mmHg): 139/62 Reference Range: 80 - 120 mg / dl Electronic Signature(s) Signed: 07/17/2020 3:51:10 PM By: [name] Coria [name] Entered By: [name] 15:20:23

## 2020-07-17 NOTE — Progress Notes (Signed)
PERIAN, TEDDER ([id number]) Visit Report for 07/16/2020 Arrival Information Details Patient Name: [name], [name] Date of Service: 07/16/2020 11:30 AM Medical Record Number: [id number] Patient Account Number: [id number] Date of Birth/Sex: [birth year] (55 y.o. F) Treating RN: Donnamarie Poag Primary Care Surena Welge: PATIENT, NO Other Clinician: Referring Zianne Schubring: Referral, Self Treating Cullen Vanallen/Extender: Tito Dine in Treatment: 4 Visit Information History Since Last Visit All ordered tests and consults were completed: No Patient Arrived: Ambulatory Had a fall or experienced change in No Arrival Time: 12:09 activities of daily living that may affect Accompanied By: self risk of falls: Transfer Assistance: None Hospitalized since last visit: No Patient Identification Verified: Yes Pain Present Now: No Secondary Verification Process Completed: Yes Patient Requires Transmission-Based No Precautions: Patient Has Alerts: Yes Patient Alerts: Patient on Blood Thinner DIABETIC Electronic Signature(s) Signed: 07/16/2020 12:46:16 PM By: Jeanine Luz Entered By: Jeanine Luz on 07/16/2020 12:46:16 Shidler, Ider ([id number]) -------------------------------------------------------------------------------- Clinic Level of Care Assessment Details Patient Name: [name] Date of Service: 07/16/2020 11:30 AM Medical Record Number: [id number] Patient Account Number: [id number] Date of Birth/Sex: July 17, 1965 (55 y.o. F) Treating RN: Donnamarie Poag Primary Care Lowry Bala: PATIENT, NO Other Clinician: Referring Seville Downs: Referral, Self Treating Lonzy Mato/Extender: Tito Dine in Treatment: 4 Clinic Level of Care Assessment Items TOOL 1 Quantity Score []  - Use when EandM and Procedure is performed on INITIAL visit 0 ASSESSMENTS - Nursing Assessment / Reassessment []  - General Physical Exam (combine w/ comprehensive assessment (listed just below) when performed on  new 0 pt. evals) []  - 0 Comprehensive Assessment (HX, ROS, Risk Assessments, Wounds Hx, etc.) ASSESSMENTS - Wound and Skin Assessment / Reassessment []  - Dermatologic / Skin Assessment (not related to wound area) 0 ASSESSMENTS - Ostomy and/or Continence Assessment and Care []  - Incontinence Assessment and Management 0 []  - 0 Ostomy Care Assessment and Management (repouching, etc.) PROCESS - Coordination of Care []  - Simple Patient / Family Education for ongoing care 0 []  - 0 Complex (extensive) Patient / Family Education for ongoing care []  - 0 Staff obtains Programmer, systems, Records, Test Results / Process Orders []  - 0 Staff telephones HHA, Nursing Homes / Clarify orders / etc []  - 0 Routine Transfer to another Facility (non-emergent condition) []  - 0 Routine Hospital Admission (non-emergent condition) []  - 0 New Admissions / Biomedical engineer / Ordering NPWT, Apligraf, etc. []  - 0 Emergency Hospital Admission (emergent condition) PROCESS - Special Needs []  - Pediatric / Minor Patient Management 0 []  - 0 Isolation Patient Management []  - 0 Hearing / Language / Visual special needs []  - 0 Assessment of Community assistance (transportation, D/C planning, etc.) []  - 0 Additional assistance / Altered mentation []  - 0 Support Surface(s) Assessment (bed, cushion, seat, etc.) INTERVENTIONS - Miscellaneous []  - External ear exam 0 []  - 0 Patient Transfer (multiple staff / Civil Service fast streamer / Similar devices) []  - 0 Simple Staple / Suture removal (25 or less) []  - 0 Complex Staple / Suture removal (26 or more) []  - 0 Hypo/Hyperglycemic Management (do not check if billed separately) []  - 0 Ankle / Brachial Index (ABI) - do not check if billed separately Has the patient been seen at the hospital within the last three years: Yes Total Score: 0 Level Of Care: ____ [name] ([id number]) Electronic Signature(s) Signed: 07/17/2020 4:53:53 PM By: Jeanine Luz Entered By:  Jeanine Luz on 07/16/2020 12:46:46 Sianez, Ardella ([id number]) -------------------------------------------------------------------------------- Compression Therapy Details Patient Name: [name] Date of Service: 07/16/2020 11:30 AM Medical Record Number: [id number] Patient  Account Number: [id number] Date of Birth/Sex: 1966-01-09 (55 y.o. F) Treating RN: Donnamarie Poag Primary Care Prestina Raigoza: PATIENT, NO Other Clinician: Referring Yaniris Braddock: Referral, Self Treating Onyx Edgley/Extender: Ricard Dillon Weeks in Treatment: 4 Compression Therapy Performed for Wound Assessment: Wound #6 Right,Lateral Lower Leg Performed By: Clinician Donnamarie Poag, RN Compression Type: Three Layer Electronic Signature(s) Signed: 07/17/2020 4:53:53 PM By: Jeanine Luz Entered By: Jeanine Luz on 07/16/2020 12:10:36 Berberian, [name] ([id number]) -------------------------------------------------------------------------------- Encounter Discharge Information Details Patient Name: [name] Date of Service: 07/16/2020 11:30 AM Medical Record Number: [id number] Patient Account Number: [id number] Date of Birth/Sex: [birth year] (55 y.o. F) Treating RN: Donnamarie Poag Primary Care Mamta Rimmer: PATIENT, NO Other Clinician: Referring Charlcie Prisco: Referral, Self Treating Fay Swider/Extender: Tito Dine in Treatment: 4 Encounter Discharge Information Items Discharge Condition: Stable Ambulatory Status: Ambulatory Discharge Destination: Home Transportation: Private Auto Accompanied By: self Schedule Follow-up Appointment: Yes Clinical Summary of Care: Electronic Signature(s) Signed: 07/16/2020 12:46:39 PM By: Jeanine Luz Entered By: Jeanine Luz on 07/16/2020 12:46:39 Horrell, Barb ([id number]) -------------------------------------------------------------------------------- Wound Assessment Details Patient Name: [name] Date of Service: 07/16/2020 11:30 AM Medical Record Number:  [id number] Patient Account Number: [id number] Date of Birth/Sex: 07/24/1965 (55 y.o. F) Treating RN: Donnamarie Poag Primary Care Estrella Alcaraz: PATIENT, NO Other Clinician: Referring Adeeb Konecny: Referral, Self Treating Sora Olivo/Extender: Ricard Dillon Weeks in Treatment: 4 Wound Status Wound Number: 6 Primary Trauma, Other Etiology: Wound Location: Right, Lateral Lower Leg Wound Open Wounding Event: Trauma Status: Date Acquired: 05/07/2020 Comorbid Congestive Heart Failure, Coronary Artery Disease, Weeks Of Treatment: 4 History: Hypertension, Myocardial Infarction, Peripheral Venous Clustered Wound: No Disease, Type II Diabetes, End Stage Renal Disease, Neuropathy Wound Measurements Length: (cm) 4 Width: (cm) 2 Depth: (cm) 0.1 Area: (cm) 6.283 Volume: (cm) 0.628 % Reduction in Area: 97.8% % Reduction in Volume: 97.8% Epithelialization: Large (67-100%) Wound Description Classification: Partial Thickness Wound Margin: Flat and Intact Exudate Amount: Medium Exudate Type: Serosanguineous Exudate Color: red, brown Foul Odor After Cleansing: No Slough/Fibrino Yes Wound Bed Granulation Amount: Large (67-100%) Exposed Structure Granulation Quality: Red Fascia Exposed: No Necrotic Amount: Small (1-33%) Fat Layer (Subcutaneous Tissue) Exposed: Yes Necrotic Quality: Adherent Slough Tendon Exposed: No Muscle Exposed: No Joint Exposed: No Bone Exposed: No Treatment Notes Wound #6 (Lower Leg) Wound Laterality: Right, Lateral Cleanser Soap and Water Discharge Instruction: Gently cleanse wound with antibacterial soap, rinse and pat dry prior to dressing wounds Peri-Wound Care Moisturizing Lotion Discharge Instruction: Suggestions: Theraderm, Eucerin, Cetaphil, or patient preference. Topical Primary Dressing Curad Oil Emulsion Dressing 3x3 (in/in) Discharge Instruction: Apply on wound bed Secondary Dressing ABD Pad 5x9 (in/in) Discharge Instruction: Cover dressing Leonhard,  Makiyah ([id number]) Secured With Compression Wrap Profore Lite LF 3 Multilayer Compression Bandaging System Discharge Instruction: Apply 3 multi-layer wrap as prescribed. Compression Stockings Add-Ons Electronic Signature(s) Signed: 07/16/2020 12:39:32 PM By: Donnamarie Poag Signed: 07/17/2020 4:53:53 PM By: Jeanine Luz Entered By: Jeanine Luz on 07/16/2020 12:10:07

## 2020-07-20 ENCOUNTER — Other Ambulatory Visit (HOSPITAL_COMMUNITY): Payer: Self-pay | Admitting: Cardiology

## 2020-07-20 DIAGNOSIS — I5022 Chronic systolic (congestive) heart failure: Secondary | ICD-10-CM

## 2020-07-23 ENCOUNTER — Ambulatory Visit: Payer: Medicare Other | Admitting: Physician Assistant

## 2020-07-25 ENCOUNTER — Other Ambulatory Visit: Payer: Self-pay

## 2020-07-25 ENCOUNTER — Encounter: Payer: Medicare Other | Attending: Physician Assistant | Admitting: Physician Assistant

## 2020-07-25 DIAGNOSIS — E1122 Type 2 diabetes mellitus with diabetic chronic kidney disease: Secondary | ICD-10-CM | POA: Diagnosis not present

## 2020-07-25 DIAGNOSIS — I890 Lymphedema, not elsewhere classified: Secondary | ICD-10-CM | POA: Insufficient documentation

## 2020-07-25 DIAGNOSIS — I872 Venous insufficiency (chronic) (peripheral): Secondary | ICD-10-CM | POA: Insufficient documentation

## 2020-07-25 DIAGNOSIS — I5042 Chronic combined systolic (congestive) and diastolic (congestive) heart failure: Secondary | ICD-10-CM | POA: Insufficient documentation

## 2020-07-25 DIAGNOSIS — L97811 Non-pressure chronic ulcer of other part of right lower leg limited to breakdown of skin: Secondary | ICD-10-CM | POA: Insufficient documentation

## 2020-07-25 DIAGNOSIS — N183 Chronic kidney disease, stage 3 unspecified: Secondary | ICD-10-CM | POA: Insufficient documentation

## 2020-07-25 DIAGNOSIS — I132 Hypertensive heart and chronic kidney disease with heart failure and with stage 5 chronic kidney disease, or end stage renal disease: Secondary | ICD-10-CM | POA: Insufficient documentation

## 2020-07-25 DIAGNOSIS — L97819 Non-pressure chronic ulcer of other part of right lower leg with unspecified severity: Secondary | ICD-10-CM | POA: Diagnosis present

## 2020-07-25 DIAGNOSIS — E11622 Type 2 diabetes mellitus with other skin ulcer: Secondary | ICD-10-CM | POA: Diagnosis not present

## 2020-07-25 NOTE — Progress Notes (Addendum)
[name], [name] ([id number]) Visit Report for 07/25/2020 Chief Complaint Document Details Patient Name: [name], [name] Date of Service: 07/25/2020 1:30 PM Medical Record Number: [id number] Patient Account Number: [id number] Date of Birth/Sex: 1965-08-15 (55 y.o. F) Treating RN: Dolan Amen Primary Care Provider: PATIENT, NO Other Clinician: Referring Provider: Referral, Self Treating Provider/Extender: Skipper Cliche in Treatment: 5 Information Obtained from: Patient Chief Complaint Right LE Ulcer Electronic Signature(s) Signed: 07/25/2020 2:23:08 PM By: Worthy Keeler PA-C Entered By: Worthy Keeler on 07/25/2020 14:23:08 [name], [name] ([id number]) -------------------------------------------------------------------------------- HPI Details Patient Name: [name] Date of Service: 07/25/2020 1:30 PM Medical Record Number: [id number] Patient Account Number: [id number] Date of Birth/Sex: 1965-07-25 (55 y.o. F) Treating RN: Dolan Amen Primary Care Provider: PATIENT, NO Other Clinician: Referring Provider: Referral, Self Treating Provider/Extender: Skipper Cliche in Treatment: 5 History of Present Illness HPI Description: 10/24/2018 on evaluation today patient presents for initial evaluation or clinic concerning issues that she has been having with lymphedema for quite some time. Unfortunately she has several wound openings at this point that secondary to her lymphedema/venous stasis are giving her trouble and leaking quite severely. She also has diabetes along with hypertension and stage III chronic kidney disease. Fortunately there is no signs of active infection at this time. She is going to likely require debridement of the left leg ulcer upon evaluation today just based on what I am seeing. Fortunately there is no wound opening on the right. Overall the patient seems to be doing quite well and again there is no evidence of systemic infection which is good news. No fevers,  chills, nausea, vomiting, or diarrhea. 10/31/2018 on evaluation today patient actually appears to be doing excellent in regard to her lower extremity ulcers. She has been tolerating the dressing changes without complication. Fortunately there is no signs of active infection at this time. She has tolerated 3 layer compression wrap without complication. 11/07/2018 upon evaluation today patient actually appears to be doing very well with regard to her left lower extremity ulcers. She has been tolerating the dressing changes without complication. Fortunately there is no signs of active infection. No fevers, chills, nausea, vomiting, or diarrhea. 11/21/2018 upon evaluation today patient appears to be doing quite well with regard to her lower extremity ulcers. In fact both areas seem to be showing signs of good improvement which is excellent. She is not having as much pain as she has in the past and again has a lot of healing compared to previous visits as well. 12/05/2018 on evaluation today patient presents for follow-up concerning her ongoing issues with her bilateral lower extremity ulcers. The good news is her right lower extremity is showing signs of completely healing at this time which is great news. Fortunately there is no evidence of active infection. On the left she has just a very small area still remaining that is open at this time all in all she is very close to complete closure in my opinion. She does have compression stockings to wear at home. 12/12/2018 on evaluation today patient appears to be doing excellent in regard to her left lateral lower extremity ulcer. She has been tolerating the dressing changes without complication. Fortunately there is no signs of active infection at this time. In fact this appears to be pretty much healed at this point although again she is not 100% today. No fevers, chills, nausea, vomiting, or diarrhea. 12/19/2018 on evaluation today patient actually appears to  be doing excellent with regard to her lower extremity ulcer  in fact this appears to be completely healed today which is all some. She has done extremely well with wound care measures. No fevers, chills, nausea, vomiting, or diarrhea. ------------------------------------ 11/01/19-Readmission to the clinic Patient presents with left leg pain, wounds posterior calf, onset about 4 weeks, denies any fevers chills or shakes, has not been using anything to these wounds, was given compression stockings at last discharge from the clinic but has not been able to use them as they rolled down and cause creases Patient's history significant for type 2 diabetes insulin requiring, A1c of 6.9 lately, hypertension, chronic pain 8/18; patient readmitted that the clinic last week. She has wounds on her left lateral posterior lower leg all of this in close juxtaposition i.e. a localized site. We've been using silver alginate under 3 layer compression apparently the wound surface area is better. She was wearing compression stockings from elastic therapy but says they were falling down. As she progresses more towards healing will need to address what we use in terms of compression stockings perhaps external compression garments 11/17/2019 on evaluation today patient appears to be doing well with regard to her legs currently. She does have 2 wounds which are actually measuring much better the more posterior is actually doing very well which I am pleased with the other though smaller is not quite a small but nonetheless on the lateral aspect does seem to be improving. 11/23/2019 on evaluation today patient appears to be doing well in regard to her wounds. In fact the wound on the posterior aspect of her leg appears healed the lateral aspect is still open but extremely small. 9/15; this is a patient with chronic venous insufficiency and secondary lymphedema. She did not keep her clinic appointment last week and hence the left  leg is a lot more swollen since she had to take the wrap off at some point. She has a small weeping area on the left lateral lower leg. She has probably a juxta lite stocking for the left leg I have asked her to bring that in when she comes to her clinic appointment next week at which time she should be healed 10/6; this is a patient with chronic venous insufficiency and secondary lymphedema left greater than right. Skin changes of chronic lymphedema especially in the left leg. She has not been here in 3 weeks. She took the wrap off 2 weeks ago. She has some very old 20/30 stockings from elastic therapy in [place] she has been using. Fortunately her leg is closed. She also has a single Farrow wrap for the left leg Readmission: 06/18/2020 upon evaluation today patient presents for reevaluation here in the clinic. She is a long-term patient that we have seen intermittently when she has had flareups of issues with her lower extremity secondary to chronic venous stasis. With that being said unfortunately today she is having a significant issue with a wound which is actually quite significant over the right lower extremity. She did see dermatology they put Vaseline followed by T[place]a island dressings on her unfortunately she is continuing to have significant issues however here with pain and in fact this wrap was extremely stuck to her upon evaluation today. Nonetheless I believe that she is going to require a little bit different approach to try to keep things from sticking and hopefully aid in getting this to heal more effectively and quickly. With that being said she does have a history of Rotert, Emmarae ([id number]) chronic venous insufficiency, lymphedema, diabetes mellitus type 2,  chronic kidney disease stage III, hypertension, and congestive heart failure. This started as an injury in mid February and has worsened over the past 2 weeks. 07/04/2020 upon evaluation today patient appears to be doing  well with regard to her wound on the leg. Overall I am extremely pleased with where things stand and I do think that she is making excellent progress. There is no sign of infection right now which is also great news. I think that she is close to complete resolution. 07/09/20 upon evaluation today patient appears to be doing excellent in regard to her leg ulcer. She has been tolerating the dressing changes without complication and in general I am extremely pleased with where things stand today. There does not appear to be any signs of active infection at this time which is great news. 07/25/2020 upon evaluation today patient appears to be doing well with regard to her wound. She has been tolerating the dressing changes without complication. Fortunately there is no signs of active infection at this time and her wound appears to be completely healed. Electronic Signature(s) Signed: 07/25/2020 2:23:15 PM By: Worthy Keeler PA-C Entered By: Worthy Keeler on 07/25/2020 14:23:15 [name], [name] ([id number]) -------------------------------------------------------------------------------- Physical Exam Details Patient Name: [name] Date of Service: 07/25/2020 1:30 PM Medical Record Number: [id number] Patient Account Number: [id number] Date of Birth/Sex: May 21, 1965 (55 y.o. F) Treating RN: Dolan Amen Primary Care Provider: PATIENT, NO Other Clinician: Referring Provider: Referral, Self Treating Provider/Extender: Skipper Cliche in Treatment: 5 Constitutional Well-nourished and well-hydrated in no acute distress. Respiratory normal breathing without difficulty. Psychiatric this patient is able to make decisions and demonstrates good insight into disease process. Alert and Oriented x 3. pleasant and cooperative. Notes Patient's wound bed showed signs of good granulation epithelization at this point. There does not appear to be any evidence of infection and overall extremely pleased with  where things stand. I do not see any evidence that this seems to be worsening in general as far as the wound is concerned and in fact I think it is completely healed based on what I see. Electronic Signature(s) Signed: 07/25/2020 2:23:38 PM By: Worthy Keeler PA-C Entered By: Worthy Keeler on 07/25/2020 14:23:38 [name], [name] ([id number]) -------------------------------------------------------------------------------- Physician Orders Details Patient Name: [name] Date of Service: 07/25/2020 1:30 PM Medical Record Number: [id number] Patient Account Number: [id number] Date of Birth/Sex: 01/26/1966 (55 y.o. F) Treating RN: Dolan Amen Primary Care Provider: PATIENT, NO Other Clinician: Referring Provider: Referral, Self Treating Provider/Extender: Skipper Cliche in Treatment: 5 Verbal / Phone Orders: No Diagnosis Coding ICD-10 Coding Code Description I87.2 Venous insufficiency (chronic) (peripheral) I89.0 Lymphedema, not elsewhere classified L97.811 Non-pressure chronic ulcer of other part of right lower leg limited to breakdown of skin E11.622 Type 2 diabetes mellitus with other skin ulcer N18.30 Chronic kidney disease, stage 3 unspecified I10 Essential (primary) hypertension I50.42 Chronic combined systolic (congestive) and diastolic (congestive) heart failure Discharge From [facility] o Discharge from [facility] o Wear compression garments daily. Put garments on first thing when you wake up and remove them before bed. Electronic Signature(s) Signed: 07/25/2020 5:11:22 PM By: Georges Mouse, Minus Breeding RN Signed: 07/26/2020 6:22:42 PM By: Worthy Keeler PA-C Entered By: Georges Mouse, Minus Breeding on 07/25/2020 14:24:27 [name], [name] ([id number]) -------------------------------------------------------------------------------- Problem List Details Patient Name: [name] Date of Service: 07/25/2020 1:30 PM Medical Record Number:  [id number] Patient Account Number: [id number] Date of Birth/Sex: 09-14-1965 (56 y.o. F) Treating RN: Dolan Amen Primary Care  Provider: PATIENT, NO Other Clinician: Referring Provider: Referral, Self Treating Provider/Extender: Skipper Cliche in Treatment: 5 Active Problems ICD-10 Encounter Code Description Active Date MDM Diagnosis I87.2 Venous insufficiency (chronic) (peripheral) 06/18/2020 No Yes I89.0 Lymphedema, not elsewhere classified 06/18/2020 No Yes L97.811 Non-pressure chronic ulcer of other part of right lower leg limited to 06/18/2020 No Yes breakdown of skin E11.622 Type 2 diabetes mellitus with other skin ulcer 06/18/2020 No Yes N18.30 Chronic kidney disease, stage 3 unspecified 06/18/2020 No Yes I10 Essential (primary) hypertension 06/18/2020 No Yes I50.42 Chronic combined systolic (congestive) and diastolic (congestive) heart 06/18/2020 No Yes failure Inactive Problems Resolved Problems Electronic Signature(s) Signed: 07/25/2020 2:23:01 PM By: Worthy Keeler PA-C Entered By: Worthy Keeler on 07/25/2020 14:23:01 [name], [name] ([id number]) -------------------------------------------------------------------------------- Progress Note Details Patient Name: [name] Date of Service: 07/25/2020 1:30 PM Medical Record Number: [id number] Patient Account Number: [id number] Date of Birth/Sex: [birth year] (55 y.o. F) Treating RN: Dolan Amen Primary Care Provider: PATIENT, NO Other Clinician: Referring Provider: Referral, Self Treating Provider/Extender: Skipper Cliche in Treatment: 5 Subjective Chief Complaint Information obtained from Patient Right LE Ulcer History of Present Illness (HPI) 10/24/2018 on evaluation today patient presents for initial evaluation or clinic concerning issues that she has been having with lymphedema for quite some time. Unfortunately she has several wound openings at this point that secondary to her lymphedema/venous stasis are giving  her trouble and leaking quite severely. She also has diabetes along with hypertension and stage III chronic kidney disease. Fortunately there is no signs of active infection at this time. She is going to likely require debridement of the left leg ulcer upon evaluation today just based on what I am seeing. Fortunately there is no wound opening on the right. Overall the patient seems to be doing quite well and again there is no evidence of systemic infection which is good news. No fevers, chills, nausea, vomiting, or diarrhea. 10/31/2018 on evaluation today patient actually appears to be doing excellent in regard to her lower extremity ulcers. She has been tolerating the dressing changes without complication. Fortunately there is no signs of active infection at this time. She has tolerated 3 layer compression wrap without complication. 11/07/2018 upon evaluation today patient actually appears to be doing very well with regard to her left lower extremity ulcers. She has been tolerating the dressing changes without complication. Fortunately there is no signs of active infection. No fevers, chills, nausea, vomiting, or diarrhea. 11/21/2018 upon evaluation today patient appears to be doing quite well with regard to her lower extremity ulcers. In fact both areas seem to be showing signs of good improvement which is excellent. She is not having as much pain as she has in the past and again has a lot of healing compared to previous visits as well. 12/05/2018 on evaluation today patient presents for follow-up concerning her ongoing issues with her bilateral lower extremity ulcers. The good news is her right lower extremity is showing signs of completely healing at this time which is great news. Fortunately there is no evidence of active infection. On the left she has just a very small area still remaining that is open at this time all in all she is very close to complete closure in my opinion. She does have  compression stockings to wear at home. 12/12/2018 on evaluation today patient appears to be doing excellent in regard to her left lateral lower extremity ulcer. She has been tolerating the dressing changes without complication. Fortunately there is no signs  of active infection at this time. In fact this appears to be pretty much healed at this point although again she is not 100% today. No fevers, chills, nausea, vomiting, or diarrhea. 12/19/2018 on evaluation today patient actually appears to be doing excellent with regard to her lower extremity ulcer in fact this appears to be completely healed today which is all some. She has done extremely well with wound care measures. No fevers, chills, nausea, vomiting, or diarrhea. ------------------------------------ 11/01/19-Readmission to the clinic Patient presents with left leg pain, wounds posterior calf, onset about 4 weeks, denies any fevers chills or shakes, has not been using anything to these wounds, was given compression stockings at last discharge from the clinic but has not been able to use them as they rolled down and cause creases Patient's history significant for type 2 diabetes insulin requiring, A1c of 6.9 lately, hypertension, chronic pain 8/18; patient readmitted that the clinic last week. She has wounds on her left lateral posterior lower leg all of this in close juxtaposition i.e. a localized site. We've been using silver alginate under 3 layer compression apparently the wound surface area is better. She was wearing compression stockings from elastic therapy but says they were falling down. As she progresses more towards healing will need to address what we use in terms of compression stockings perhaps external compression garments 11/17/2019 on evaluation today patient appears to be doing well with regard to her legs currently. She does have 2 wounds which are actually measuring much better the more posterior is actually doing very well  which I am pleased with the other though smaller is not quite a small but nonetheless on the lateral aspect does seem to be improving. 11/23/2019 on evaluation today patient appears to be doing well in regard to her wounds. In fact the wound on the posterior aspect of her leg appears healed the lateral aspect is still open but extremely small. 9/15; this is a patient with chronic venous insufficiency and secondary lymphedema. She did not keep her clinic appointment last week and hence the left leg is a lot more swollen since she had to take the wrap off at some point. She has a small weeping area on the left lateral lower leg. She has probably a juxta lite stocking for the left leg I have asked her to bring that in when she comes to her clinic appointment next week at which time she should be healed 10/6; this is a patient with chronic venous insufficiency and secondary lymphedema left greater than right. Skin changes of chronic lymphedema especially in the left leg. She has not been here in 3 weeks. She took the wrap off 2 weeks ago. She has some very old 20/30 stockings from elastic therapy in  [place] she has been using. Fortunately her leg is closed. She also has a single Farrow wrap for the left leg Readmission: 06/18/2020 upon evaluation today patient presents for reevaluation here in the clinic. She is a long-term patient that we have seen intermittently [name], [name] ([id number]) when she has had flareups of issues with her lower extremity secondary to chronic venous stasis. With that being said unfortunately today she is having a significant issue with a wound which is actually quite significant over the right lower extremity. She did see dermatology they put Vaseline followed by Telfa island dressings on her unfortunately she is continuing to have significant issues however here with pain and in fact this wrap was extremely stuck to her upon evaluation today. Nonetheless  I believe that she  is going to require a little bit different approach to try to keep things from sticking and hopefully aid in getting this to heal more effectively and quickly. With that being said she does have a history of chronic venous insufficiency, lymphedema, diabetes mellitus type 2, chronic kidney disease stage III, hypertension, and congestive heart failure. This started as an injury in mid February and has worsened over the past 2 weeks. 07/04/2020 upon evaluation today patient appears to be doing well with regard to her wound on the leg. Overall I am extremely pleased with where things stand and I do think that she is making excellent progress. There is no sign of infection right now which is also great news. I think that she is close to complete resolution. 07/09/20 upon evaluation today patient appears to be doing excellent in regard to her leg ulcer. She has been tolerating the dressing changes without complication and in general I am extremely pleased with where things stand today. There does not appear to be any signs of active infection at this time which is great news. 07/25/2020 upon evaluation today patient appears to be doing well with regard to her wound. She has been tolerating the dressing changes without complication. Fortunately there is no signs of active infection at this time and her wound appears to be completely healed. Objective Constitutional Well-nourished and well-hydrated in no acute distress. Vitals Time Taken: 1:44 PM, Height: 67 in, Weight: 315 lbs, BMI: 49.3, Temperature: 98.6 F, Pulse: 79 bpm, Respiratory Rate: 18 breaths/min, Blood Pressure: 141/77 mmHg. Respiratory normal breathing without difficulty. Psychiatric this patient is able to make decisions and demonstrates good insight into disease process. Alert and Oriented x 3. pleasant and cooperative. General Notes: Patient's wound bed showed signs of good granulation epithelization at this point. There does not appear  to be any evidence of infection and overall extremely pleased with where things stand. I do not see any evidence that this seems to be worsening in general as far as the wound is concerned and in fact I think it is completely healed based on what I see. Integumentary (Hair, Skin) Wound #6 status is Healed - Epithelialized. Original cause of wound was Trauma. The date acquired was: 05/07/2020. The wound has been in treatment 5 weeks. The wound is located on the Right,Lateral Lower Leg. The wound measures 0cm length x 0cm width x 0cm depth; 0cm^2 area and 0cm^3 volume. There is no tunneling or undermining noted. There is a none present amount of drainage noted. The wound margin is flat and intact. There is no granulation within the wound bed. There is no necrotic tissue within the wound bed. Assessment Active Problems ICD-10 Venous insufficiency (chronic) (peripheral) Lymphedema, not elsewhere classified Non-pressure chronic ulcer of other part of right lower leg limited to breakdown of skin Type 2 diabetes mellitus with other skin ulcer Chronic kidney disease, stage 3 unspecified Essential (primary) hypertension Chronic combined systolic (congestive) and diastolic (congestive) heart failure Plan [name], [name] ([id number]) 1. I would recommend that we have the patient go ahead and continue with compression therapy at this point. I think that for now we will use Tubigrip. I was under the impression that she already had a Velcro compression wrap that she can use at home. Apparently she only has 1 for the left leg not the right leg. 2. We will go ahead and see about ordering the Juzo compression wrap for her. With that being said I am not certain  that insurance does not cover it we will see what they say however. I do believe this is good to be beneficial however as far as trying to prevent her from reopening and having further issues with this wound. We will see the patient back for follow-up  visit as needed. Electronic Signature(s) Signed: 07/25/2020 2:24:23 PM By: Worthy Keeler PA-C Entered By: Worthy Keeler on 07/25/2020 14:24:23 [name], [name] ([id number]) -------------------------------------------------------------------------------- SuperBill Details Patient Name: [name] Date of Service: 07/25/2020 Medical Record Number: [id number] Patient Account Number: [id number] Date of Birth/Sex: 1965-05-03 (55 y.o. F) Treating RN: Dolan Amen Primary Care Provider: PATIENT, NO Other Clinician: Referring Provider: Referral, Self Treating Provider/Extender: Skipper Cliche in Treatment: 5 Diagnosis Coding ICD-10 Codes Code Description I87.2 Venous insufficiency (chronic) (peripheral) I89.0 Lymphedema, not elsewhere classified L97.811 Non-pressure chronic ulcer of other part of right lower leg limited to breakdown of skin E11.622 Type 2 diabetes mellitus with other skin ulcer N18.30 Chronic kidney disease, stage 3 unspecified I10 Essential (primary) hypertension I50.42 Chronic combined systolic (congestive) and diastolic (congestive) heart failure Facility Procedures CPT4 Code: [id number] Description: [phone] - WOUND CARE VISIT-LEV 2 EST PT Modifier: Quantity: 1 Physician Procedures CPT4 Code: 6160737 Description: 99213 - WC PHYS LEVEL 3 - EST PT Modifier: Quantity: 1 CPT4 Code: Description: ICD-10 Diagnosis Description I87.2 Venous insufficiency (chronic) (peripheral) I89.0 Lymphedema, not elsewhere classified 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

## 2020-07-28 ENCOUNTER — Encounter (HOSPITAL_BASED_OUTPATIENT_CLINIC_OR_DEPARTMENT_OTHER): Payer: Medicare Other | Admitting: Cardiology

## 2020-07-28 ENCOUNTER — Ambulatory Visit: Payer: Medicare Other

## 2020-07-28 DIAGNOSIS — G4733 Obstructive sleep apnea (adult) (pediatric): Secondary | ICD-10-CM | POA: Diagnosis not present

## 2020-07-28 DIAGNOSIS — I5032 Chronic diastolic (congestive) heart failure: Secondary | ICD-10-CM

## 2020-07-28 NOTE — Procedures (Signed)
  Sleep Study Report  Patient Information Name: [name]  ID: [id number] Birth Date: [birth year]  Age: 55  Gender: Female BMI:49.21 Sleep Study Information Study Date:07/15/2020 Referring Physician:  Loralie Champagne MD    TEST DESCRIPTION: Home sleep apnea testing was completed using the WatchPat, a Type 1 device, utilizing  peripheral arterial tonometry (PAT), chest movement, actigraphy, pulse oximetry, pulse rate, body position and snore.  AHI was calculated with apnea and hypopnea using valid sleep time as the denominator. RDI includes apneas,  hypopneas, and RERAs. The data acquired and the scoring of sleep and all associated events were performed in  accordance with the recommended standards and specifications as outlined in the AASM Manual for the Scoring of  Sleep and Associated Events 2.2.0 (2015).   FINDINGS:  1. Mild Obstructive Sleep Apnea with AHI 12.6/hr.  2. No Central Sleep Apnea with pAHIc 2.7/hr.  3. Oxygen desaturations as low as 84%.  4. Mild snoring was present. O2 sats were < 88% for 0.3 min.  5. Total sleep time was 5 hrs and 4 min.  6. 11.2% of total sleep time was spent in REM sleep.  7. Normal sleep onset latency at 14 min.  8. Norma; REM sleep onset latency at 80 min.  9. Total awakenings were 19.   DIAGNOSIS: Mild Obstructive Sleep Apnea (G47.33)  RECOMMENDATIONS:  1. Clinical correlation of these findings is necessary. The decision to treat obstructive sleep apnea (OSA) is usually  based on the presence of apnea symptoms or the presence of associated medical conditions such as Hypertension,  Congestive Heart Failure, Atrial Fibrillation or Obesity. The most common symptoms of OSA are snoring, gasping for  breath while sleeping, daytime sleepiness and fatigue.   2. Initiating apnea therapy is recommended given the presence of symptoms and/or associated conditions.  Recommend proceeding with one of the following:   a. Auto-CPAP therapy with a  pressure range of 5-20cm H2O.   b. An oral appliance (OA) that can be obtained from certain dentists with expertise in sleep medicine. These are  primarily of use in non-obese patients with mild and moderate disease.   c. An ENT consultation which may be useful to look for specific causes of obstruction and possible treatment  Options.   d. If patient is intolerant to PAP therapy, consider referral to ENT for evaluation for hypoglossal nerve stimulator.   3. Close follow-up is necessary to ensure success with CPAP or oral appliance therapy for maximum benefit .  4. A follow-up oximetry study on CPAP is recommended to assess the adequacy of therapy and determine the need  for supplemental oxygen or the potential need for Bi-level therapy. An arterial blood gas to determine the adequacy of  baseline ventilation and oxygenation should also be considered.  5. Healthy sleep recommendations include: adequate nightly sleep (normal 7-9 hrs/night), avoidance of caffeine after  noon and alcohol near bedtime, and maintaining a sleep environment that is cool, dark and quiet.  6. Weight loss for overweight patients is recommended. Even modest amounts of weight loss can significantly  improve the severity of sleep apnea.  7. Snoring recommendations include: weight loss where appropriate, side sleeping, and avoidance of alcohol before  Bed.  8. Operation of motor vehicle or dangerous equipment must be avoided when feeling drowsy, excessively sleepy, or  mentally fatigued.  Report prepared by: Signature: Fransico Him, MD [facility], [place] Board of Sleep Medicine  Electronically Signed: Jul 28, 2020

## 2020-07-30 ENCOUNTER — Telehealth: Payer: Self-pay | Admitting: *Deleted

## 2020-07-30 NOTE — Telephone Encounter (Signed)
-----   Message from Sueanne Margarita, MD sent at 07/28/2020  8:25 PM EDT ----- Please let patient know that they have sleep apnea.  Order ResMed auto CPAP from 4 to 15cm H2O with heated humidity and mask of choice. Followup with me in 6 weeks.

## 2020-07-30 NOTE — Telephone Encounter (Addendum)
The patient has been notified of the result and verbalized understanding.  All questions (if any) were answered. Patient understands her study showed they have sleep apnea. Order ResMed auto CPAP from 4 to 15cm H2O with heated humidity and mask of choice. Followup with me in 6 weeks.    Upon patient request DME selection is [facility] Patient understands he will be contacted by [facility] to set up his cpap. Patient understands to call if [facility] does not contact him with new setup in a timely manner. Patient understands they will be called once confirmation has been received from [facility] that they have received their new machine to schedule 10 week follow up appointment.   [facility] notified of new cpap order  Please add to [facility] Left detailed message on voicemail and informed patient to call back with questions

## 2020-07-31 NOTE — Telephone Encounter (Signed)
Pt is aware and agreeable to her results. °

## 2020-08-05 NOTE — Progress Notes (Signed)
ELKA, SATTERFIELD ([id number]) Visit Report for 07/25/2020 Arrival Information Details Patient Name: [name], [name] Date of Service: 07/25/2020 1:30 PM Medical Record Number: [id number] Patient Account Number: [id number] Date of Birth/Sex: [birth year] (55 y.o. F) Treating RN: Carlene Coria Primary Care Zymier Rodgers: PATIENT, NO Other Clinician: Referring Linden Tagliaferro: Referral, Self Treating Braeton Wolgamott/Extender: Skipper Cliche in Treatment: 5 Visit Information History Since Last Visit All ordered tests and consults were completed: No Patient Arrived: Ambulatory Added or deleted any medications: No Arrival Time: 13:39 Any new allergies or adverse reactions: No Accompanied By: self Had a fall or experienced change in No Transfer Assistance: None activities of daily living that may affect Patient Identification Verified: Yes risk of falls: Secondary Verification Process Completed: Yes Signs or symptoms of abuse/neglect since last visito No Patient Requires Transmission-Based No Hospitalized since last visit: No Precautions: Implantable device outside of the clinic excluding No Patient Has Alerts: Yes cellular tissue based products placed in the center Patient Alerts: Patient on Blood since last visit: Thinner Has Dressing in Place as Prescribed: Yes DIABETIC Has Compression in Place as Prescribed: Yes Pain Present Now: No Electronic Signature(s) Signed: 08/05/2020 8:33:59 AM By: Carlene Coria RN Entered By: Carlene Coria on 07/25/2020 13:44:39 Hopson, Avaeh ([id number]) -------------------------------------------------------------------------------- Clinic Level of Care Assessment Details Patient Name: [name] Date of Service: 07/25/2020 1:30 PM Medical Record Number: [id number] Patient Account Number: [id number] Date of Birth/Sex: 06-Apr-1965 (55 y.o. F) Treating RN: Dolan Amen Primary Care Posey Jasmin: PATIENT, NO Other Clinician: Referring Gizell Danser: Referral, Self Treating  Devontay Celaya/Extender: Skipper Cliche in Treatment: 5 Clinic Level of Care Assessment Items TOOL 4 Quantity Score X - Use when only an EandM is performed on FOLLOW-UP visit 1 0 ASSESSMENTS - Nursing Assessment / Reassessment X - Reassessment of Co-morbidities (includes updates in patient status) 1 10 X- 1 5 Reassessment of Adherence to Treatment Plan ASSESSMENTS - Wound and Skin Assessment / Reassessment []  - Simple Wound Assessment / Reassessment - one wound 0 []  - 0 Complex Wound Assessment / Reassessment - multiple wounds []  - 0 Dermatologic / Skin Assessment (not related to wound area) ASSESSMENTS - Focused Assessment []  - Circumferential Edema Measurements - multi extremities 0 []  - 0 Nutritional Assessment / Counseling / Intervention []  - 0 Lower Extremity Assessment (monofilament, tuning fork, pulses) []  - 0 Peripheral Arterial Disease Assessment (using hand held doppler) ASSESSMENTS - Ostomy and/or Continence Assessment and Care []  - Incontinence Assessment and Management 0 []  - 0 Ostomy Care Assessment and Management (repouching, etc.) PROCESS - Coordination of Care X - Simple Patient / Family Education for ongoing care 1 15 []  - 0 Complex (extensive) Patient / Family Education for ongoing care []  - 0 Staff obtains Programmer, systems, Records, Test Results / Process Orders []  - 0 Staff telephones HHA, Nursing Homes / Clarify orders / etc []  - 0 Routine Transfer to another Facility (non-emergent condition) []  - 0 Routine Hospital Admission (non-emergent condition) []  - 0 New Admissions / Biomedical engineer / Ordering NPWT, Apligraf, etc. []  - 0 Emergency Hospital Admission (emergent condition) X- 1 10 Simple Discharge Coordination []  - 0 Complex (extensive) Discharge Coordination PROCESS - Special Needs []  - Pediatric / Minor Patient Management 0 []  - 0 Isolation Patient Management []  - 0 Hearing / Language / Visual special needs []  - 0 Assessment of Community  assistance (transportation, D/C planning, etc.) []  - 0 Additional assistance / Altered mentation []  - 0 Support Surface(s) Assessment (bed, cushion, seat, etc.) INTERVENTIONS - Wound Cleansing / Measurement Bordner, Iasia (  [id number]) []  - 0 Simple Wound Cleansing - one wound []  - 0 Complex Wound Cleansing - multiple wounds []  - 0 Wound Imaging (photographs - any number of wounds) []  - 0 Wound Tracing (instead of photographs) []  - 0 Simple Wound Measurement - one wound []  - 0 Complex Wound Measurement - multiple wounds INTERVENTIONS - Wound Dressings []  - Small Wound Dressing one or multiple wounds 0 []  - 0 Medium Wound Dressing one or multiple wounds []  - 0 Large Wound Dressing one or multiple wounds []  - 0 Application of Medications - topical []  - 0 Application of Medications - injection INTERVENTIONS - Miscellaneous []  - External ear exam 0 []  - 0 Specimen Collection (cultures, biopsies, blood, body fluids, etc.) []  - 0 Specimen(s) / Culture(s) sent or taken to Lab for analysis []  - 0 Patient Transfer (multiple staff / Civil Service fast streamer / Similar devices) []  - 0 Simple Staple / Suture removal (25 or less) []  - 0 Complex Staple / Suture removal (26 or more) []  - 0 Hypo / Hyperglycemic Management (close monitor of Blood Glucose) []  - 0 Ankle / Brachial Index (ABI) - do not check if billed separately X- 1 5 Vital Signs Has the patient been seen at the hospital within the last three years: Yes Total Score: 45 Level Of Care: New/Established - Level 2 Electronic Signature(s) Signed: 07/25/2020 5:11:22 PM By: Georges Mouse, Minus Breeding RN Entered By: Georges Mouse, Minus Breeding on 07/25/2020 14:25:10 Litzenberger, [name] ([id number]) -------------------------------------------------------------------------------- Encounter Discharge Information Details Patient Name: [name] Date of Service: 07/25/2020 1:30 PM Medical Record Number: [id number] Patient Account Number:  [id number] Date of Birth/Sex: 08/05/1965 (55 y.o. F) Treating RN: Dolan Amen Primary Care Ritaj Dullea: PATIENT, NO Other Clinician: Referring Eriyana Sweeten: Referral, Self Treating Johnte Portnoy/Extender: Skipper Cliche in Treatment: 5 Encounter Discharge Information Items Discharge Condition: Stable Ambulatory Status: Ambulatory Discharge Destination: Home Transportation: Private Auto Accompanied By: self Schedule Follow-up Appointment: No Clinical Summary of Care: Electronic Signature(s) Signed: 07/26/2020 9:52:37 AM By: Georges Mouse, Minus Breeding RN Entered By: Georges Mouse, Minus Breeding on 07/26/2020 09:52:37 Stegall, [name] ([id number]) -------------------------------------------------------------------------------- Lower Extremity Assessment Details Patient Name: [name] Date of Service: 07/25/2020 1:30 PM Medical Record Number: [id number] Patient Account Number: [id number] Date of Birth/Sex: [birth year] (55 y.o. F) Treating RN: Carlene Coria Primary Care [place]: PATIENT, NO Other Clinician: Referring Solstice Lastinger: Referral, Self Treating Lyna Laningham/Extender: Skipper Cliche in Treatment: 5 Edema Assessment Assessed: [Left: Yes] [Right: No] [Left: Edema] [Right: :] Vascular Assessment Pulses: Dorsalis Pedis Palpable: [Right:Yes] Electronic Signature(s) Signed: 08/05/2020 8:33:59 AM By: Carlene Coria RN Entered By: Carlene Coria on 07/25/2020 13:56:51 Newson, Kaitlynne ([id number]) -------------------------------------------------------------------------------- Multi Wound Chart Details Patient Name: [name] Date of Service: 07/25/2020 1:30 PM Medical Record Number: [id number] Patient Account Number: [id number] Date of Birth/Sex: October 13, 1965 (55 y.o. F) Treating RN: Dolan Amen Primary Care Taevon Aschoff: PATIENT, NO Other Clinician: Referring Rooney Gladwin: Referral, Self Treating Avrey Hyser/Extender: Skipper Cliche in Treatment: 5 Vital Signs Height(in): 67 Pulse(bpm): 85 Weight(lbs):  315 Blood Pressure(mmHg): 141/77 Body Mass Index(BMI): 49 Temperature(F): 98.6 Respiratory Rate(breaths/min): 18 Photos: [N/A:N/A] Wound Location: Right, Lateral Lower Leg N/A N/A Wounding Event: Trauma N/A N/A Primary Etiology: Trauma, Other N/A N/A Comorbid History: Congestive Heart Failure, Coronary N/A N/A Artery Disease, Hypertension, Myocardial Infarction, Peripheral Venous Disease, Type II Diabetes, End Stage Renal Disease, Neuropathy Date Acquired: 05/07/2020 N/A N/A Weeks of Treatment: 5 N/A N/A Wound Status: Open N/A N/A Measurements L x W x D (cm) 0x0x0 N/A N/A Area (cm) : 0 N/A N/A Volume (cm) :  0 N/A N/A % Reduction in Area: 100.00% N/A N/A % Reduction in Volume: 100.00% N/A N/A Classification: Partial Thickness N/A N/A Exudate Amount: None Present N/A N/A Wound Margin: Flat and Intact N/A N/A Granulation Amount: None Present (0%) N/A N/A Necrotic Amount: None Present (0%) N/A N/A Exposed Structures: Fascia: No N/A N/A Fat Layer (Subcutaneous Tissue): No Tendon: No Muscle: No Joint: No Bone: No Epithelialization: Large (67-100%) N/A N/A Treatment Notes Electronic Signature(s) Signed: 07/25/2020 5:11:22 PM By: Georges Mouse, Minus Breeding RN Entered By: Georges Mouse, Minus Breeding on 07/25/2020 14:21:41 Jahn, [name] ([id number]) -------------------------------------------------------------------------------- Multi-Disciplinary Care Plan Details Patient Name: [name] Date of Service: 07/25/2020 1:30 PM Medical Record Number: [id number] Patient Account Number: [id number] Date of Birth/Sex: May 18, 1965 (55 y.o. F) Treating RN: Dolan Amen Primary Care Carine Nordgren: PATIENT, NO Other Clinician: Referring Royann Wildasin: Referral, Self Treating Shirlean Berman/Extender: Skipper Cliche in Treatment: 5 Active Inactive Electronic Signature(s) Signed: 07/25/2020 5:11:22 PM By: Georges Mouse, Minus Breeding RN Entered By: Georges Mouse, Minus Breeding on 07/25/2020 14:21:35 Virts, [name]  ([id number]) -------------------------------------------------------------------------------- Pain Assessment Details Patient Name: [name] Date of Service: 07/25/2020 1:30 PM Medical Record Number: [id number] Patient Account Number: [id number] Date of Birth/Sex: 1965-12-19 (55 y.o. F) Treating RN: Carlene Coria Primary Care Ananiah Maciolek: PATIENT, NO Other Clinician: Referring Kevin Mario: Referral, Self Treating Leiby Pigeon/Extender: Skipper Cliche in Treatment: 5 Active Problems Location of Pain Severity and Description of Pain Patient Has Paino No Site Locations Pain Management and Medication Current Pain Management: Electronic Signature(s) Signed: 08/05/2020 8:33:59 AM By: Carlene Coria RN Entered By: Carlene Coria on 07/25/2020 13:45:06 Ihrig, [name] ([id number]) -------------------------------------------------------------------------------- Patient/Caregiver Education Details Patient Name: [name] Date of Service: 07/25/2020 1:30 PM Medical Record Number: [id number] Patient Account Number: [id number] Date of Birth/Gender: 07-20-1965 (55 y.o. F) Treating RN: Dolan Amen Primary Care Physician: PATIENT, NO Other Clinician: Referring Physician: Referral, Self Treating Physician/Extender: Skipper Cliche in Treatment: 5 Education Assessment Education Provided To: Patient Education Topics Provided Notes continue wearing compression Electronic Signature(s) Signed: 07/25/2020 5:11:22 PM By: Georges Mouse, Minus Breeding RN Entered By: Georges Mouse, Minus Breeding on 07/25/2020 14:25:47 Iacobucci, Rennae ([id number]) -------------------------------------------------------------------------------- Wound Assessment Details Patient Name: [name] Date of Service: 07/25/2020 1:30 PM Medical Record Number: [id number] Patient Account Number: [id number] Date of Birth/Sex: [birth year] (55 y.o. F) Treating RN: Carlene Coria Primary Care Chassidy Layson: PATIENT, NO Other Clinician: Referring  Lavonte Palos: Referral, Self Treating France Noyce/Extender: Skipper Cliche in Treatment: 5 Wound Status Wound Number: 6 Primary Trauma, Other Etiology: Wound Location: Right, Lateral Lower Leg Wound Healed - Epithelialized Wounding Event: Trauma Status: Date Acquired: 05/07/2020 Comorbid Congestive Heart Failure, Coronary Artery Disease, Weeks Of Treatment: 5 History: Hypertension, Myocardial Infarction, Peripheral Venous Clustered Wound: No Disease, Type II Diabetes, End Stage Renal Disease, Neuropathy Photos Wound Measurements Length: (cm) 0 Width: (cm) 0 Depth: (cm) 0 Area: (cm) 0 Volume: (cm) 0 % Reduction in Area: 100% % Reduction in Volume: 100% Epithelialization: Large (67-100%) Tunneling: No Undermining: No Wound Description Classification: Partial Thickness Wound Margin: Flat and Intact Exudate Amount: None Present Foul Odor After Cleansing: No Slough/Fibrino No Wound Bed Granulation Amount: None Present (0%) Exposed Structure Necrotic Amount: None Present (0%) Fascia Exposed: No Fat Layer (Subcutaneous Tissue) Exposed: No Tendon Exposed: No Muscle Exposed: No Joint Exposed: No Bone Exposed: No Electronic Signature(s) Signed: 07/25/2020 5:11:22 PM By: Georges Mouse, Minus Breeding RN Signed: 08/05/2020 8:33:59 AM By: Carlene Coria RN Entered By: Georges Mouse, Minus Breeding on 07/25/2020 14:22:56 Reinard, Anise ([id number]) -------------------------------------------------------------------------------- Vitals Details Patient Name: [name] Date of Service: 07/25/2020 1:30 PM Medical  Record Number: [id number] Patient Account Number: [id number] Date of Birth/Sex: [birth year] (55 y.o. F) Treating RN: Carlene Coria Primary Care Janitza Revuelta: PATIENT, NO Other Clinician: Referring Shemeca Lukasik: Referral, Self Treating Keighan Amezcua/Extender: Skipper Cliche in Treatment: 5 Vital Signs Time Taken: 13:44 Temperature (F): 98.6 Height (in): 67 Pulse (bpm): 79 Weight (lbs):  315 Respiratory Rate (breaths/min): 18 Body Mass Index (BMI): 49.3 Blood Pressure (mmHg): 141/77 Reference Range: 80 - 120 mg / dl Electronic Signature(s) Signed: 08/05/2020 8:33:59 AM By: Carlene Coria RN Entered By: Carlene Coria on 07/25/2020 13:44:59

## 2020-09-27 ENCOUNTER — Encounter (HOSPITAL_COMMUNITY): Payer: Medicare Other | Admitting: Cardiology

## 2020-09-30 ENCOUNTER — Encounter: Payer: Medicare Other | Attending: Physician Assistant | Admitting: Physician Assistant

## 2020-09-30 ENCOUNTER — Other Ambulatory Visit: Payer: Self-pay

## 2020-09-30 DIAGNOSIS — Z951 Presence of aortocoronary bypass graft: Secondary | ICD-10-CM | POA: Insufficient documentation

## 2020-09-30 DIAGNOSIS — I872 Venous insufficiency (chronic) (peripheral): Secondary | ICD-10-CM | POA: Diagnosis not present

## 2020-09-30 DIAGNOSIS — I5042 Chronic combined systolic (congestive) and diastolic (congestive) heart failure: Secondary | ICD-10-CM | POA: Insufficient documentation

## 2020-09-30 DIAGNOSIS — Z833 Family history of diabetes mellitus: Secondary | ICD-10-CM | POA: Diagnosis not present

## 2020-09-30 DIAGNOSIS — E1122 Type 2 diabetes mellitus with diabetic chronic kidney disease: Secondary | ICD-10-CM | POA: Insufficient documentation

## 2020-09-30 DIAGNOSIS — I890 Lymphedema, not elsewhere classified: Secondary | ICD-10-CM | POA: Insufficient documentation

## 2020-09-30 DIAGNOSIS — I2510 Atherosclerotic heart disease of native coronary artery without angina pectoris: Secondary | ICD-10-CM | POA: Diagnosis not present

## 2020-09-30 DIAGNOSIS — E785 Hyperlipidemia, unspecified: Secondary | ICD-10-CM | POA: Insufficient documentation

## 2020-09-30 DIAGNOSIS — Z8249 Family history of ischemic heart disease and other diseases of the circulatory system: Secondary | ICD-10-CM | POA: Diagnosis not present

## 2020-09-30 DIAGNOSIS — E11622 Type 2 diabetes mellitus with other skin ulcer: Secondary | ICD-10-CM | POA: Diagnosis present

## 2020-09-30 DIAGNOSIS — I252 Old myocardial infarction: Secondary | ICD-10-CM | POA: Insufficient documentation

## 2020-09-30 DIAGNOSIS — N183 Chronic kidney disease, stage 3 unspecified: Secondary | ICD-10-CM | POA: Diagnosis not present

## 2020-09-30 DIAGNOSIS — L97822 Non-pressure chronic ulcer of other part of left lower leg with fat layer exposed: Secondary | ICD-10-CM | POA: Insufficient documentation

## 2020-09-30 DIAGNOSIS — I130 Hypertensive heart and chronic kidney disease with heart failure and stage 1 through stage 4 chronic kidney disease, or unspecified chronic kidney disease: Secondary | ICD-10-CM | POA: Diagnosis not present

## 2020-10-01 NOTE — Progress Notes (Signed)
ALLAYA, ABBASI ([id number]) Visit Report for 09/30/2020 Chief Complaint Document Details Patient Name: [name], [name] Date of Service: 09/30/2020 8:45 AM Medical Record Number: [id number] Patient Account Number: [id number] Date of Birth/Sex: 11-12-1965 (55 y.o. F) Treating RN: Donnamarie Poag Primary Care Provider: Laurance Flatten Other Clinician: Referring Provider: Referral, Self Treating Provider/Extender: Skipper Cliche in Treatment: 0 Information Obtained from: Patient Chief Complaint Left LE Ulcer Electronic Signature(s) Signed: 09/30/2020 10:18:24 AM By: Worthy Keeler PA-C Previous Signature: 09/30/2020 10:18:11 AM Version By: Worthy Keeler PA-C Entered By: Worthy Keeler on 09/30/2020 10:18:24 Laser, [name] ([id number]) -------------------------------------------------------------------------------- HPI Details Patient Name: [name] Date of Service: 09/30/2020 8:45 AM Medical Record Number: [id number] Patient Account Number: [id number] Date of Birth/Sex: [birth year] (55 y.o. F) Treating RN: Donnamarie Poag Primary Care Provider: Laurance Flatten Other Clinician: Referring Provider: Referral, Self Treating Provider/Extender: Skipper Cliche in Treatment: 0 History of Present Illness HPI Description: 10/24/2018 on evaluation today patient presents for initial evaluation or clinic concerning issues that she has been having with lymphedema for quite some time. Unfortunately she has several wound openings at this point that secondary to her lymphedema/venous stasis are giving her trouble and leaking quite severely. She also has diabetes along with hypertension and stage III chronic kidney disease. Fortunately there is no signs of active infection at this time. She is going to likely require debridement of the left leg ulcer upon evaluation today just based on what I am seeing. Fortunately there is no wound opening on the right. Overall the patient seems to be doing quite well and again  there is no evidence of systemic infection which is good news. No fevers, chills, nausea, vomiting, or diarrhea. 10/31/2018 on evaluation today patient actually appears to be doing excellent in regard to her lower extremity ulcers. She has been tolerating the dressing changes without complication. Fortunately there is no signs of active infection at this time. She has tolerated 3 layer compression wrap without complication. 11/07/2018 upon evaluation today patient actually appears to be doing very well with regard to her left lower extremity ulcers. She has been tolerating the dressing changes without complication. Fortunately there is no signs of active infection. No fevers, chills, nausea, vomiting, or diarrhea. 11/21/2018 upon evaluation today patient appears to be doing quite well with regard to her lower extremity ulcers. In fact both areas seem to be showing signs of good improvement which is excellent. She is not having as much pain as she has in the past and again has a lot of healing compared to previous visits as well. 12/05/2018 on evaluation today patient presents for follow-up concerning her ongoing issues with her bilateral lower extremity ulcers. The good news is her right lower extremity is showing signs of completely healing at this time which is great news. Fortunately there is no evidence of active infection. On the left she has just a very small area still remaining that is open at this time all in all she is very close to complete closure in my opinion. She does have compression stockings to wear at home. 12/12/2018 on evaluation today patient appears to be doing excellent in regard to her left lateral lower extremity ulcer. She has been tolerating the dressing changes without complication. Fortunately there is no signs of active infection at this time. In fact this appears to be pretty much healed at this point although again she is not 100% today. No fevers, chills, nausea,  vomiting, or diarrhea. 12/19/2018 on evaluation today patient actually appears  to be doing excellent with regard to her lower extremity ulcer in fact this appears to be completely healed today which is all some. She has done extremely well with wound care measures. No fevers, chills, nausea, vomiting, or diarrhea. ------------------------------------ 11/01/19-Readmission to the clinic Patient presents with left leg pain, wounds posterior calf, onset about 4 weeks, denies any fevers chills or shakes, has not been using anything to these wounds, was given compression stockings at last discharge from the clinic but has not been able to use them as they rolled down and cause creases Patient's history significant for type 2 diabetes insulin requiring, A1c of 6.9 lately, hypertension, chronic pain 8/18; patient readmitted that the clinic last week. She has wounds on her left lateral posterior lower leg all of this in close juxtaposition i.e. a localized site. We've been using silver alginate under 3 layer compression apparently the wound surface area is better. She was wearing compression stockings from elastic therapy but says they were falling down. As she progresses more towards healing will need to address what we use in terms of compression stockings perhaps external compression garments 11/17/2019 on evaluation today patient appears to be doing well with regard to her legs currently. She does have 2 wounds which are actually measuring much better the more posterior is actually doing very well which I am pleased with the other though smaller is not quite a small but nonetheless on the lateral aspect does seem to be improving. 11/23/2019 on evaluation today patient appears to be doing well in regard to her wounds. In fact the wound on the posterior aspect of her leg appears healed the lateral aspect is still open but extremely small. 9/15; this is a patient with chronic venous insufficiency and secondary  lymphedema. She did not keep her clinic appointment last week and hence the left leg is a lot more swollen since she had to take the wrap off at some point. She has a small weeping area on the left lateral lower leg. She has probably a juxta lite stocking for the left leg I have asked her to bring that in when she comes to her clinic appointment next week at which time she should be healed 10/6; this is a patient with chronic venous insufficiency and secondary lymphedema left greater than right. Skin changes of chronic lymphedema especially in the left leg. She has not been here in 3 weeks. She took the wrap off 2 weeks ago. She has some very old 20/30 stockings from elastic therapy in [place] she has been using. Fortunately her leg is closed. She also has a single Farrow wrap for the left leg Readmission: 06/18/2020 upon evaluation today patient presents for reevaluation here in the clinic. She is a long-term patient that we have seen intermittently when she has had flareups of issues with her lower extremity secondary to chronic venous stasis. With that being said unfortunately today she is having a significant issue with a wound which is actually quite significant over the right lower extremity. She did see dermatology they put Vaseline followed by Telfa island dressings on her unfortunately she is continuing to have significant issues however here with pain and in fact this wrap was extremely stuck to her upon evaluation today. Nonetheless I believe that she is going to require a little bit different approach to try to keep things from sticking and hopefully aid in getting this to heal more effectively and quickly. With that being said she does have a history of  Burroughs, Meghanne ([id number]) chronic venous insufficiency, lymphedema, diabetes mellitus type 2, chronic kidney disease stage III, hypertension, and congestive heart failure. This started as an injury in mid February and has worsened over  the past 2 weeks. 07/04/2020 upon evaluation today patient appears to be doing well with regard to her wound on the leg. Overall I am extremely pleased with where things stand and I do think that she is making excellent progress. There is no sign of infection right now which is also great news. I think that she is close to complete resolution. 07/09/20 upon evaluation today patient appears to be doing excellent in regard to her leg ulcer. She has been tolerating the dressing changes without complication and in general I am extremely pleased with where things stand today. There does not appear to be any signs of active infection at this time which is great news. 07/25/2020 upon evaluation today patient appears to be doing well with regard to her wound. She has been tolerating the dressing changes without complication. Fortunately there is no signs of active infection at this time and her wound appears to be completely healed. Readmission: 09/30/2020 upon evaluation today patient appears for reevaluation here in our clinic. She was discharged May 5 that unfortunately has started to have weeping and wounds on the left leg at this point. She tells me the right leg did weep some but nothing as significant as the left. With all that being said at this point the patient states that she figured it was best to come in here and she was having a lot of drainage and it was get all of her sheets and everything. She has been wearing her Velcro wraps sometimes but it does not sound like she is been wearing them all the time which I think is part of the reason why this reopened. She also unfortunately had a fall which was quite significant. This occurred I believe about a week or 2 ago. Otherwise patient's past medical history really has not changed significantly. Electronic Signature(s) Signed: 09/30/2020 12:41:16 PM By: Worthy Keeler PA-C Entered By: Worthy Keeler on 09/30/2020 12:41:15 Marrow, [name]  ([id number]) -------------------------------------------------------------------------------- Physical Exam Details Patient Name: [name] Date of Service: 09/30/2020 8:45 AM Medical Record Number: [id number] Patient Account Number: [id number] Date of Birth/Sex: Oct 19, 1965 (55 y.o. F) Treating RN: Donnamarie Poag Primary Care Provider: Laurance Flatten Other Clinician: Referring Provider: Referral, Self Treating Provider/Extender: Skipper Cliche in Treatment: 0 Constitutional patient is hypertensive.. pulse regular and within target range for patient.Marland Kitchen respirations regular, non-labored and within target range for patient.Marland Kitchen temperature within target range for patient.. Well-nourished and well-hydrated in no acute distress. Eyes conjunctiva clear no eyelid edema noted. pupils equal round and reactive to light and accommodation. Ears, Nose, Mouth, and Throat no gross abnormality of ear auricles or external auditory canals. normal hearing noted during conversation. mucus membranes moist. Respiratory normal breathing without difficulty. Cardiovascular 2+ dorsalis pedis/posterior tibialis pulses. 1+ pitting edema of the bilateral lower extremities. Musculoskeletal normal gait and posture. no significant deformity or arthritic changes, no loss or range of motion, no clubbing. Psychiatric this patient is able to make decisions and demonstrates good insight into disease process. Alert and Oriented x 3. pleasant and cooperative. Notes Upon inspection patient's wound bed actually showed signs of good granulation and epithelization at this point. There does not appear to be any signs of significant slough buildup although she does have minimal slough noted this is really not deep in regard to  any wound openings and I think honestly she will probably do quite well with compression wrapping I do not believe sharp debridement is necessary at this point. Obviously if that changes that we need to perform  sharp debridement we will do so going forward. Electronic Signature(s) Signed: 09/30/2020 12:41:52 PM By: Worthy Keeler PA-C Entered By: Worthy Keeler on 09/30/2020 12:41:52 Pasillas, [name] ([id number]) -------------------------------------------------------------------------------- Physician Orders Details Patient Name: [name] Date of Service: 09/30/2020 8:45 AM Medical Record Number: [id number] Patient Account Number: [id number] Date of Birth/Sex: 09-25-1965 (55 y.o. F) Treating RN: Carlene Coria Primary Care Provider: Laurance Flatten Other Clinician: Referring Provider: Referral, Self Treating Provider/Extender: Skipper Cliche in Treatment: 0 Verbal / Phone Orders: No Diagnosis Coding ICD-10 Coding Code Description I87.2 Venous insufficiency (chronic) (peripheral) I89.0 Lymphedema, not elsewhere classified L97.822 Non-pressure chronic ulcer of other part of left lower leg with fat layer exposed E11.622 Type 2 diabetes mellitus with other skin ulcer N18.30 Chronic kidney disease, stage 3 unspecified I10 Essential (primary) hypertension I50.42 Chronic combined systolic (congestive) and diastolic (congestive) heart failure Follow-up Appointments o Return Appointment in 1 week. Bathing/ Shower/ Hygiene o May shower with wound dressing protected with water repellent cover or cast protector. Edema Control - Lymphedema / Segmental Compressive Device / Other o 3 Layer Compression System for Lymphedema. - right lower leg o Elevate, Exercise Daily and Avoid Standing for Long Periods of Time. o Elevate legs to the level of the heart and pump ankles as often as possible o Elevate leg(s) parallel to the floor when sitting. Wound Treatment Wound #7 - Lower Leg Cleanser: Soap and Water 1 x Per Week/30 Days Discharge Instructions: Gently cleanse wound with antibacterial soap, rinse and pat dry prior to dressing wounds Primary Dressing: Silvercel 4 1/4x 4 1/4 (in/in) 1 x  Per Week/30 Days Discharge Instructions: Apply Silvercel 4 1/4x 4 1/4 (in/in) as instructed Secondary Dressing: ABD Pad 5x9 (in/in) 1 x Per Week/30 Days Discharge Instructions: Cover with ABD pad Compression Wrap: Profore Lite LF 3 Multilayer Compression Bandaging System 1 x Per Week/30 Days Discharge Instructions: Apply 3 multi-layer wrap as prescribed. Wound #8 - Lower Leg Wound Laterality: Left, Posterior Cleanser: Soap and Water 1 x Per Week/30 Days Discharge Instructions: Gently cleanse wound with antibacterial soap, rinse and pat dry prior to dressing wounds Primary Dressing: Silvercel 4 1/4x 4 1/4 (in/in) 1 x Per Week/30 Days Discharge Instructions: Apply Silvercel 4 1/4x 4 1/4 (in/in) as instructed Secondary Dressing: ABD Pad 5x9 (in/in) 1 x Per Week/30 Days Discharge Instructions: Cover with ABD pad Compression Wrap: Profore Lite LF 3 Multilayer Compression Bandaging System 1 x Per Week/30 Days Discharge Instructions: Apply 3 multi-layer wrap as prescribed. MANETTE, DOTO ([id number]) Electronic Signature(s) Signed: 09/30/2020 3:56:57 PM By: Worthy Keeler PA-C Signed: 09/30/2020 7:11:39 PM By: Carlene Coria RN Entered By: Carlene Coria on 09/30/2020 10:26:18 Sochacki, [name] ([id number]) -------------------------------------------------------------------------------- Problem List Details Patient Name: [name] Date of Service: 09/30/2020 8:45 AM Medical Record Number: [id number] Patient Account Number: [id number] Date of Birth/Sex: 1965-03-25 (55 y.o. F) Treating RN: Donnamarie Poag Primary Care Provider: Laurance Flatten Other Clinician: Referring Provider: Referral, Self Treating Provider/Extender: Skipper Cliche in Treatment: 0 Active Problems ICD-10 Encounter Code Description Active Date MDM Diagnosis I87.2 Venous insufficiency (chronic) (peripheral) 09/30/2020 No Yes I89.0 Lymphedema, not elsewhere classified 09/30/2020 No Yes L97.822 Non-pressure chronic ulcer of other  part of left lower leg with fat layer 09/30/2020 No Yes exposed E11.622 Type 2 diabetes mellitus with other skin  ulcer 09/30/2020 No Yes N18.30 Chronic kidney disease, stage 3 unspecified 09/30/2020 No Yes I10 Essential (primary) hypertension 09/30/2020 No Yes I50.42 Chronic combined systolic (congestive) and diastolic (congestive) heart 09/30/2020 No Yes failure Inactive Problems Resolved Problems Electronic Signature(s) Signed: 09/30/2020 10:18:03 AM By: Worthy Keeler PA-C Entered By: Worthy Keeler on 09/30/2020 10:18:03 Beyl, Tecla ([id number]) -------------------------------------------------------------------------------- Progress Note Details Patient Name: [name] Date of Service: 09/30/2020 8:45 AM Medical Record Number: [id number] Patient Account Number: [id number] Date of Birth/Sex: May 29, 1965 (55 y.o. F) Treating RN: Donnamarie Poag Primary Care Provider: Laurance Flatten Other Clinician: Referring Provider: Referral, Self Treating Provider/Extender: Skipper Cliche in Treatment: 0 Subjective Chief Complaint Information obtained from Patient Left LE Ulcer History of Present Illness (HPI) 10/24/2018 on evaluation today patient presents for initial evaluation or clinic concerning issues that she has been having with lymphedema for quite some time. Unfortunately she has several wound openings at this point that secondary to her lymphedema/venous stasis are giving her trouble and leaking quite severely. She also has diabetes along with hypertension and stage III chronic kidney disease. Fortunately there is no signs of active infection at this time. She is going to likely require debridement of the left leg ulcer upon evaluation today just based on what I am seeing. Fortunately there is no wound opening on the right. Overall the patient seems to be doing quite well and again there is no evidence of systemic infection which is good news. No fevers, chills, nausea, vomiting, or  diarrhea. 10/31/2018 on evaluation today patient actually appears to be doing excellent in regard to her lower extremity ulcers. She has been tolerating the dressing changes without complication. Fortunately there is no signs of active infection at this time. She has tolerated 3 layer compression wrap without complication. 11/07/2018 upon evaluation today patient actually appears to be doing very well with regard to her left lower extremity ulcers. She has been tolerating the dressing changes without complication. Fortunately there is no signs of active infection. No fevers, chills, nausea, vomiting, or diarrhea. 11/21/2018 upon evaluation today patient appears to be doing quite well with regard to her lower extremity ulcers. In fact both areas seem to be showing signs of good improvement which is excellent. She is not having as much pain as she has in the past and again has a lot of healing compared to previous visits as well. 12/05/2018 on evaluation today patient presents for follow-up concerning her ongoing issues with her bilateral lower extremity ulcers. The good news is her right lower extremity is showing signs of completely healing at this time which is great news. Fortunately there is no evidence of active infection. On the left she has just a very small area still remaining that is open at this time all in all she is very close to complete closure in my opinion. She does have compression stockings to wear at home. 12/12/2018 on evaluation today patient appears to be doing excellent in regard to her left lateral lower extremity ulcer. She has been tolerating the dressing changes without complication. Fortunately there is no signs of active infection at this time. In fact this appears to be pretty much healed at this point although again she is not 100% today. No fevers, chills, nausea, vomiting, or diarrhea. 12/19/2018 on evaluation today patient actually appears to be doing excellent with  regard to her lower extremity ulcer in fact this appears to be completely healed today which is all some. She has done extremely well with wound  care measures. No fevers, chills, nausea, vomiting, or diarrhea. ------------------------------------ 11/01/19-Readmission to the clinic Patient presents with left leg pain, wounds posterior calf, onset about 4 weeks, denies any fevers chills or shakes, has not been using anything to these wounds, was given compression stockings at last discharge from the clinic but has not been able to use them as they rolled down and cause creases Patient's history significant for type 2 diabetes insulin requiring, A1c of 6.9 lately, hypertension, chronic pain 8/18; patient readmitted that the clinic last week. She has wounds on her left lateral posterior lower leg all of this in close juxtaposition i.e. a localized site. We've been using silver alginate under 3 layer compression apparently the wound surface area is better. She was wearing compression stockings from elastic therapy but says they were falling down. As she progresses more towards healing will need to address what we use in terms of compression stockings perhaps external compression garments 11/17/2019 on evaluation today patient appears to be doing well with regard to her legs currently. She does have 2 wounds which are actually measuring much better the more posterior is actually doing very well which I am pleased with the other though smaller is not quite a small but nonetheless on the lateral aspect does seem to be improving. 11/23/2019 on evaluation today patient appears to be doing well in regard to her wounds. In fact the wound on the posterior aspect of her leg appears healed the lateral aspect is still open but extremely small. 9/15; this is a patient with chronic venous insufficiency and secondary lymphedema. She did not keep her clinic appointment last week and hence the left leg is a lot more  swollen since she had to take the wrap off at some point. She has a small weeping area on the left lateral lower leg. She has probably a juxta lite stocking for the left leg I have asked her to bring that in when she comes to her clinic appointment next week at which time she should be healed 10/6; this is a patient with chronic venous insufficiency and secondary lymphedema left greater than right. Skin changes of chronic lymphedema especially in the left leg. She has not been here in 3 weeks. She took the wrap off 2 weeks ago. She has some very old 20/30 stockings from elastic therapy in [place] she has been using. Fortunately her leg is closed. She also has a single Farrow wrap for the left leg Readmission: 06/18/2020 upon evaluation today patient presents for reevaluation here in the clinic. She is a long-term patient that we have seen intermittently Hasley, Mariselda ([id number]) when she has had flareups of issues with her lower extremity secondary to chronic venous stasis. With that being said unfortunately today she is having a significant issue with a wound which is actually quite significant over the right lower extremity. She did see dermatology they put Vaseline followed by [place]a island dressings on her unfortunately she is continuing to have significant issues however here with pain and in fact this wrap was extremely stuck to her upon evaluation today. Nonetheless I believe that she is going to require a little bit different approach to try to keep things from sticking and hopefully aid in getting this to heal more effectively and quickly. With that being said she does have a history of chronic venous insufficiency, lymphedema, diabetes mellitus type 2, chronic kidney disease stage III, hypertension, and congestive heart failure. This started as an injury in mid February and has  worsened over the past 2 weeks. 07/04/2020 upon evaluation today patient appears to be doing well with regard to  her wound on the leg. Overall I am extremely pleased with where things stand and I do think that she is making excellent progress. There is no sign of infection right now which is also great news. I think that she is close to complete resolution. 07/09/20 upon evaluation today patient appears to be doing excellent in regard to her leg ulcer. She has been tolerating the dressing changes without complication and in general I am extremely pleased with where things stand today. There does not appear to be any signs of active infection at this time which is great news. 07/25/2020 upon evaluation today patient appears to be doing well with regard to her wound. She has been tolerating the dressing changes without complication. Fortunately there is no signs of active infection at this time and her wound appears to be completely healed. Readmission: 09/30/2020 upon evaluation today patient appears for reevaluation here in our clinic. She was discharged May 5 that unfortunately has started to have weeping and wounds on the left leg at this point. She tells me the right leg did weep some but nothing as significant as the left. With all that being said at this point the patient states that she figured it was best to come in here and she was having a lot of drainage and it was get all of her sheets and everything. She has been wearing her Velcro wraps sometimes but it does not sound like she is been wearing them all the time which I think is part of the reason why this reopened. She also unfortunately had a fall which was quite significant. This occurred I believe about a week or 2 ago. Otherwise patient's past medical history really has not changed significantly. Patient History Information obtained from Patient. Allergies Tylenol PM (Reaction: hives), diazepam, latex (Reaction: itching), simvastatin (Reaction: joint pain), ciprofloxacin (Reaction: anxiety), levofloxacin (Reaction: anxiety), Omnicef (Reaction:  anxiety) Family History Cancer - Father,Siblings, Diabetes - Mother,Siblings, Heart Disease - Mother,Maternal Grandparents, Hypertension - Mother,Maternal Grandparents, No family history of Hereditary Spherocytosis, Kidney Disease, Lung Disease, Seizures, Stroke, Thyroid Problems, Tuberculosis. Social History Never smoker, Marital Status - Divorced, Alcohol Use - Never, Drug Use - No History, Caffeine Use - Never. Medical History Eyes Denies history of Cataracts, Glaucoma, Optic Neuritis Ear/Nose/Mouth/Throat Denies history of Chronic sinus problems/congestion, Middle ear problems Hematologic/Lymphatic Denies history of Anemia, Hemophilia, Human Immunodeficiency Virus, Lymphedema, Sickle Cell Disease Respiratory Denies history of Aspiration, Asthma, Chronic Obstructive Pulmonary Disease (COPD), Pneumothorax, Sleep Apnea, Tuberculosis Cardiovascular Patient has history of Congestive Heart Failure, Coronary Artery Disease - CABG 2010, Hypertension, Myocardial Infarction - November 2021, Peripheral Venous Disease Denies history of Angina, Arrhythmia, Deep Vein Thrombosis, Hypotension, Peripheral Arterial Disease, Phlebitis, Vasculitis Gastrointestinal Denies history of Cirrhosis , Colitis, Crohn s, Hepatitis A, Hepatitis B, Hepatitis C Endocrine Patient has history of Type II Diabetes Denies history of Type I Diabetes Genitourinary Patient has history of 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 Immunological Denies history of Lupus Erythematosus, Raynaud s, Scleroderma Integumentary (Skin) Denies history of History of Burn, History of pressure wounds Musculoskeletal Denies history of Gout, Rheumatoid Arthritis, Osteoarthritis, Osteomyelitis Neurologic Patient has history of Neuropathy Denies history of Dementia, Quadriplegia, Paraplegia, Seizure Disorder Oncologic Denies history of Received Chemotherapy, Received Radiation Psychiatric Denies history of Anorexia/bulimia, Confinement  Anxiety Hospitalization/Surgery History - c section. - hernia. - cabg. RACQUELLE, HYSER ([id number]) Medical And Surgical  History Notes Cardiovascular HLD Objective Constitutional patient is hypertensive.. pulse regular and within target range for patient.Marland Kitchen respirations regular, non-labored and within target range for patient.Marland Kitchen temperature within target range for patient.. Well-nourished and well-hydrated in no acute distress. Vitals Time Taken: 9:30 AM, Temperature: 99.1 F, Pulse: 82 bpm, Respiratory Rate: 18 breaths/min, Blood Pressure: 157/65 mmHg. Eyes conjunctiva clear no eyelid edema noted. pupils equal round and reactive to light and accommodation. Ears, Nose, Mouth, and Throat no gross abnormality of ear auricles or external auditory canals. normal hearing noted during conversation. mucus membranes moist. Respiratory normal breathing without difficulty. Cardiovascular 2+ dorsalis pedis/posterior tibialis pulses. 1+ pitting edema of the bilateral lower extremities. Musculoskeletal normal gait and posture. no significant deformity or arthritic changes, no loss or range of motion, no clubbing. Psychiatric this patient is able to make decisions and demonstrates good insight into disease process. Alert and Oriented x 3. pleasant and cooperative. General Notes: Upon inspection patient's wound bed actually showed signs of good granulation and epithelization at this point. There does not appear to be any signs of significant slough buildup although she does have minimal slough noted this is really not deep in regard to any wound openings and I think honestly she will probably do quite well with compression wrapping I do not believe sharp debridement is necessary at this point. Obviously if that changes that we need to perform sharp debridement we will do so going forward. Integumentary (Hair, Skin) Wound #7 status is Open. Original cause of wound was Gradually Appeared. The date acquired  was: 09/20/2020. The wound is located on the Lower Leg. The wound measures 11cm length x 4cm width x 0.1cm depth; 34.558cm^2 area and 3.456cm^3 volume. There is Fat Layer (Subcutaneous Tissue) exposed. There is no tunneling or undermining noted. There is a medium amount of serosanguineous drainage noted. There is small (1-33%) red granulation within the wound bed. There is a large (67-100%) amount of necrotic tissue within the wound bed including Adherent Slough. Wound #8 status is Open. Original cause of wound was Gradually Appeared. The date acquired was: 09/20/2020. The wound is located on the Left,Posterior Lower Leg. The wound measures 2.5cm length x 1.5cm width x 0.1cm depth; 2.945cm^2 area and 0.295cm^3 volume. There is Fat Layer (Subcutaneous Tissue) exposed. There is no tunneling or undermining noted. There is a medium amount of serosanguineous drainage noted. There is small (1-33%) pink granulation within the wound bed. There is a large (67-100%) amount of necrotic tissue within the wound bed including Adherent Slough. Assessment Active Problems ICD-10 Venous insufficiency (chronic) (peripheral) Lymphedema, not elsewhere classified Non-pressure chronic ulcer of other part of left lower leg with fat layer exposed Type 2 diabetes mellitus with other skin ulcer Chronic kidney disease, stage 3 unspecified Essential (primary) hypertension Chronic combined systolic (congestive) and diastolic (congestive) heart failure Matt, Berdell ([id number]) Procedures Wound #7 Pre-procedure diagnosis of Wound #7 is a Diabetic Wound/Ulcer of the Lower Extremity located on the Lower Leg . There was a Three Layer Compression Therapy Procedure by Carlene Coria, RN. Post procedure Diagnosis Wound #7: Same as Pre-Procedure Wound #8 Pre-procedure diagnosis of Wound #8 is a Diabetic Wound/Ulcer of the Lower Extremity located on the Left,Posterior Lower Leg . There was a Three Layer Compression Therapy  Procedure by Carlene Coria, RN. Post procedure Diagnosis Wound #8: Same as Pre-Procedure There was a Three Layer Compression Therapy Procedure by Carlene Coria, RN. Post procedure Diagnosis Wound #: Same as Pre-Procedure Plan Follow-up Appointments: Return Appointment in 1 week.  Bathing/ Shower/ Hygiene: May shower with wound dressing protected with water repellent cover or cast protector. Edema Control - Lymphedema / Segmental Compressive Device / Other: 3 Layer Compression System for Lymphedema. - right lower leg Elevate, Exercise Daily and Avoid Standing for Long Periods of Time. Elevate legs to the level of the heart and pump ankles as often as possible Elevate leg(s) parallel to the floor when sitting. WOUND #7: - Lower Leg Wound Laterality: Cleanser: Soap and Water 1 x Per Week/30 Days Discharge Instructions: Gently cleanse wound with antibacterial soap, rinse and pat dry prior to dressing wounds Primary Dressing: Silvercel 4 1/4x 4 1/4 (in/in) 1 x Per Week/30 Days Discharge Instructions: Apply Silvercel 4 1/4x 4 1/4 (in/in) as instructed Secondary Dressing: ABD Pad 5x9 (in/in) 1 x Per Week/30 Days Discharge Instructions: Cover with ABD pad Compression Wrap: Profore Lite LF 3 Multilayer Compression Bandaging System 1 x Per Week/30 Days Discharge Instructions: Apply 3 multi-layer wrap as prescribed. WOUND #8: - Lower Leg Wound Laterality: Left, Posterior Cleanser: Soap and Water 1 x Per Week/30 Days Discharge Instructions: Gently cleanse wound with antibacterial soap, rinse and pat dry prior to dressing wounds Primary Dressing: Silvercel 4 1/4x 4 1/4 (in/in) 1 x Per Week/30 Days Discharge Instructions: Apply Silvercel 4 1/4x 4 1/4 (in/in) as instructed Secondary Dressing: ABD Pad 5x9 (in/in) 1 x Per Week/30 Days Discharge Instructions: Cover with ABD pad Compression Wrap: Profore Lite LF 3 Multilayer Compression Bandaging System 1 x Per Week/30 Days Discharge Instructions: Apply 3  multi-layer wrap as prescribed. 1. Would recommend currently that we going continue with the wound care measures as before and the patient is in agreement the plan. This includes the use of the 3 layer compression wrap which has done well for her in the past always. 2. I am also can recommend at this time that we use silver cell to the open wound locations followed by ABD pads to cover. 3. I am also can recommend that we have the patient continue to elevate her legs is much as possible to try to help with edema control this is still of utmost importance as well. We will see patient back for reevaluation in 1 week here in the clinic. If anything worsens or changes patient will contact our office for additional recommendations. Electronic Signature(s) Signed: 09/30/2020 12:42:31 PM By: Worthy Keeler PA-C Entered By: Worthy Keeler on 09/30/2020 12:42:31 Miggins, [place] ([id number]) VAUNDA, GUTTERMAN ([id number]) -------------------------------------------------------------------------------- ROS/PFSH Details Patient Name: [name] Date of Service: 09/30/2020 8:45 AM Medical Record Number: [id number] Patient Account Number: [id number] Date of Birth/Sex: 1966-03-17 (55 y.o. F) Treating RN: Carlene Coria Primary Care Provider: Laurance Flatten Other Clinician: Referring Provider: Referral, Self Treating Provider/Extender: Skipper Cliche in Treatment: 0 Information Obtained From Patient Eyes Medical History: Negative for: Cataracts; Glaucoma; Optic Neuritis Ear/Nose/Mouth/Throat Medical History: Negative for: Chronic sinus problems/congestion; Middle ear problems Hematologic/Lymphatic Medical History: Negative for: Anemia; Hemophilia; Human Immunodeficiency Virus; Lymphedema; Sickle Cell Disease Respiratory Medical History: Negative for: Aspiration; Asthma; Chronic Obstructive Pulmonary Disease (COPD); Pneumothorax; Sleep Apnea; Tuberculosis Cardiovascular Medical History: Positive  for: Congestive Heart Failure; Coronary Artery Disease - CABG 2010; Hypertension; Myocardial Infarction - November 2021; Peripheral Venous Disease Negative for: Angina; Arrhythmia; Deep Vein Thrombosis; Hypotension; Peripheral Arterial Disease; Phlebitis; Vasculitis Past Medical History Notes: HLD Gastrointestinal Medical History: Negative for: Cirrhosis ; Colitis; Crohnos; Hepatitis A; Hepatitis B; Hepatitis C Endocrine Medical History: Positive for: Type II Diabetes Negative for: Type I Diabetes Time with diabetes: 1997 Treated  with: Insulin Blood sugar tested every day: Yes Tested : 3 x daily Genitourinary Medical History: Positive for: 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 Immunological Medical History: Negative for: Lupus Erythematosus; Raynaudos; Scleroderma Integumentary (Skin) Glatfelter, Jaydy ([id number]) Medical History: Negative for: History of Burn; History of pressure wounds Musculoskeletal Medical History: Negative for: Gout; Rheumatoid Arthritis; Osteoarthritis; Osteomyelitis Neurologic Medical History: Positive for: Neuropathy Negative for: Dementia; Quadriplegia; Paraplegia; Seizure Disorder Oncologic Medical History: Negative for: Received Chemotherapy; Received Radiation Psychiatric Medical History: Negative for: Anorexia/bulimia; Confinement Anxiety Immunizations Pneumococcal Vaccine: Received Pneumococcal Vaccination: Yes Implantable Devices None Hospitalization / Surgery History Type of Hospitalization/Surgery c section hernia cabg Family and Social History Cancer: Yes - Father,Siblings; Diabetes: Yes - Mother,Siblings; Heart Disease: Yes - Mother,Maternal Grandparents; Hereditary Spherocytosis: No; Hypertension: Yes - Mother,Maternal Grandparents; Kidney Disease: No; Lung Disease: No; Seizures: No; Stroke: No; Thyroid Problems: No; Tuberculosis: No; Never smoker; Marital Status - Divorced; Alcohol Use: Never; Drug Use: No History; Caffeine Use:  Never; Financial Concerns: No; Food, Clothing or Shelter Needs: No; Support System Lacking: No; Transportation Concerns: No Electronic Signature(s) Signed: 09/30/2020 3:56:57 PM By: Worthy Keeler PA-C Signed: 09/30/2020 7:11:39 PM By: Carlene Coria RN Entered By: Carlene Coria on 09/30/2020 09:50:29 Wingert, [name] ([id number]) -------------------------------------------------------------------------------- SuperBill Details Patient Name: [name] Date of Service: 09/30/2020 Medical Record Number: [id number] Patient Account Number: [id number] Date of Birth/Sex: 12-26-1965 (55 y.o. F) Treating RN: Carlene Coria Primary Care Provider: Laurance Flatten Other Clinician: Referring Provider: Referral, Self Treating Provider/Extender: Skipper Cliche in Treatment: 0 Diagnosis Coding ICD-10 Codes Code Description I87.2 Venous insufficiency (chronic) (peripheral) I89.0 Lymphedema, not elsewhere classified L97.822 Non-pressure chronic ulcer of other part of left lower leg with fat layer exposed E11.622 Type 2 diabetes mellitus with other skin ulcer N18.30 Chronic kidney disease, stage 3 unspecified I10 Essential (primary) hypertension I50.42 Chronic combined systolic (congestive) and diastolic (congestive) heart failure Facility Procedures CPT4: Description Modifier Quantity Code [id number] - WOUND CARE VISIT-LEV 4 EST PT 1 CPT4: 65681275 [id number] BILATERAL: Application of multi-layer venous compression system; leg (below knee), including 1 ankle and foot. Physician Procedures CPT4 Code: [id number] Description: 75916 - WC PHYS LEVEL 4 - EST PT Modifier: Quantity: 1 CPT4 Code: Description: ICD-10 Diagnosis Description I87.2 Venous insufficiency (chronic) (peripheral) I89.0 Lymphedema, not elsewhere classified L97.822 Non-pressure chronic ulcer of other part of left lower leg with fat lay E11.622 Type 2 diabetes mellitus with  other skin ulcer Modifier: er exposed Quantity: Electronic  Signature(s) Signed: 09/30/2020 12:42:49 PM By: Worthy Keeler PA-C Entered By: Worthy Keeler on 09/30/2020 12:42:48

## 2020-10-01 NOTE — Progress Notes (Signed)
KELEIGH, KAZEE ([id number]) Visit Report for 09/30/2020 Allergy List Details Patient Name: [name], [name] Date of Service: 09/30/2020 8:45 AM Medical Record Number: [id number] Patient Account Number: [id number] Date of Birth/Sex: 04-08-1965 (55 y.o. F) Treating RN: Carlene Coria Primary Care Shanetta Nicolls: Laurance Flatten Other Clinician: Referring Sereen Schaff: Referral, Self Treating Suhan Paci/Extender: Skipper Cliche in Treatment: 0 Allergies Active Allergies Tylenol PM Reaction: hives diazepam latex Reaction: itching simvastatin Reaction: joint pain ciprofloxacin Reaction: anxiety levofloxacin Reaction: anxiety Omnicef Reaction: anxiety Allergy Notes Electronic Signature(s) Signed: 09/30/2020 7:11:39 PM By: Carlene Coria RN Entered By: Carlene Coria on 09/30/2020 09:50:13 Sheeran, [name] ([id number]) -------------------------------------------------------------------------------- Arrival Information Details Patient Name: [name] Date of Service: 09/30/2020 8:45 AM Medical Record Number: [id number] Patient Account Number: [id number] Date of Birth/Sex: [birth year] (55 y.o. F) Treating RN: Carlene Coria Primary Care Melicia Esqueda: Laurance Flatten Other Clinician: Referring Travaris Kosh: Referral, Self Treating Chanya Chrisley/Extender: Skipper Cliche in Treatment: 0 Visit Information Patient Arrived: Ambulatory Arrival Time: 09:31 Accompanied By: self Transfer Assistance: None Patient Identification Verified: Yes Secondary Verification Process Completed: Yes Patient Requires Transmission-Based Precautions: No Patient Has Alerts: No History Since Last Visit All ordered tests and consults were completed: No Added or deleted any medications: No Any new allergies or adverse reactions: No Had a fall or experienced change in activities of daily living that may affect risk of falls: No Signs or symptoms of abuse/neglect since last visito No Hospitalized since last visit: No Implantable device  outside of the clinic excluding cellular tissue based products placed in the center since last visit: No Electronic Signature(s) Signed: 09/30/2020 7:11:39 PM By: Carlene Coria RN Entered By: Carlene Coria on 09/30/2020 09:32:33 Kinnick, Wayne ([id number]) -------------------------------------------------------------------------------- Clinic Level of Care Assessment Details Patient Name: [name], [name] Date of Service: 09/30/2020 8:45 AM Medical Record Number: [id number] Patient Account Number: [id number] Date of Birth/Sex: 26-Feb-1966 (55 y.o. F) Treating RN: Carlene Coria Primary Care Wyvonne Carda: Laurance Flatten Other Clinician: Referring Traniyah Hallett: Referral, Self Treating Alhaji Mcneal/Extender: Skipper Cliche in Treatment: 0 Clinic Level of Care Assessment Items TOOL 2 Quantity Score X - Use when only an EandM is performed on the INITIAL visit 1 0 ASSESSMENTS - Nursing Assessment / Reassessment X - General Physical Exam (combine w/ comprehensive assessment (listed just below) when performed on new 1 20 pt. evals) X- 1 25 Comprehensive Assessment (HX, ROS, Risk Assessments, Wounds Hx, etc.) ASSESSMENTS - Wound and Skin Assessment / Reassessment []  - Simple Wound Assessment / Reassessment - one wound 0 X- 2 5 Complex Wound Assessment / Reassessment - multiple wounds []  - 0 Dermatologic / Skin Assessment (not related to wound area) ASSESSMENTS - Ostomy and/or Continence Assessment and Care []  - Incontinence Assessment and Management 0 []  - 0 Ostomy Care Assessment and Management (repouching, etc.) PROCESS - Coordination of Care []  - Simple Patient / Family Education for ongoing care 0 X- 1 20 Complex (extensive) Patient / Family Education for ongoing care []  - 0 Staff obtains Programmer, systems, Records, Test Results / Process Orders []  - 0 Staff telephones HHA, Nursing Homes / Clarify orders / etc []  - 0 Routine Transfer to another Facility (non-emergent condition) []  - 0 Routine Hospital  Admission (non-emergent condition) X- 1 15 New Admissions / Biomedical engineer / Ordering NPWT, Apligraf, etc. []  - 0 Emergency Hospital Admission (emergent condition) X- 1 10 Simple Discharge Coordination []  - 0 Complex (extensive) Discharge Coordination PROCESS - Special Needs []  - Pediatric / Minor Patient Management 0 []  - 0 Isolation Patient Management []  - 0 Hearing / Language /  Visual special needs []  - 0 Assessment of Community assistance (transportation, D/C planning, etc.) []  - 0 Additional assistance / Altered mentation []  - 0 Support Surface(s) Assessment (bed, cushion, seat, etc.) INTERVENTIONS - Wound Cleansing / Measurement X - Wound Imaging (photographs - any number of wounds) 1 5 []  - 0 Wound Tracing (instead of photographs) []  - 0 Simple Wound Measurement - one wound X- 2 5 Complex Wound Measurement - multiple wounds Laughter, Lousie ([id number]) []  - 0 Simple Wound Cleansing - one wound X- 2 5 Complex Wound Cleansing - multiple wounds INTERVENTIONS - Wound Dressings X - Small Wound Dressing one or multiple wounds 1 10 []  - 0 Medium Wound Dressing one or multiple wounds X- 1 20 Large Wound Dressing one or multiple wounds []  - 0 Application of Medications - injection INTERVENTIONS - Miscellaneous []  - External ear exam 0 []  - 0 Specimen Collection (cultures, biopsies, blood, body fluids, etc.) []  - 0 Specimen(s) / Culture(s) sent or taken to Lab for analysis []  - 0 Patient Transfer (multiple staff / Civil Service fast streamer / Similar devices) []  - 0 Simple Staple / Suture removal (25 or less) []  - 0 Complex Staple / Suture removal (26 or more) []  - 0 Hypo / Hyperglycemic Management (close monitor of Blood Glucose) []  - 0 Ankle / Brachial Index (ABI) - do not check if billed separately Has the patient been seen at the hospital within the last three years: Yes Total Score: 155 Level Of Care: New/Established - Level 4 Electronic Signature(s) Signed:  09/30/2020 7:11:39 PM By: Carlene Coria RN Entered By: Carlene Coria on 09/30/2020 10:27:09 Belue, [name] ([id number]) -------------------------------------------------------------------------------- Compression Therapy Details Patient Name: [name] Date of Service: 09/30/2020 8:45 AM Medical Record Number: [id number] Patient Account Number: [id number] Date of Birth/Sex: [birth year] (55 y.o. F) Treating RN: Carlene Coria Primary Care Renn Stille: Laurance Flatten Other Clinician: Referring Richell Corker: Referral, Self Treating Ruthann Angulo/Extender: Skipper Cliche in Treatment: 0 Compression Therapy Performed for Wound Assessment: Wound #7 Lower Leg Performed By: Clinician Carlene Coria, RN Compression Type: Three Layer Post Procedure Diagnosis Same as Pre-procedure Electronic Signature(s) Signed: 09/30/2020 7:11:39 PM By: Carlene Coria RN Entered By: Carlene Coria on 09/30/2020 10:27:40 Palos, [name] ([id number]) -------------------------------------------------------------------------------- Compression Therapy Details Patient Name: [name] Date of Service: 09/30/2020 8:45 AM Medical Record Number: [id number] Patient Account Number: [id number] Date of Birth/Sex: Mar 18, 1966 (55 y.o. F) Treating RN: Carlene Coria Primary Care Carrell Rahmani: Laurance Flatten Other Clinician: Referring Hilda Wexler: Referral, Self Treating Imran Nuon/Extender: Skipper Cliche in Treatment: 0 Compression Therapy Performed for Wound Assessment: Wound #8 Left,Posterior Lower Leg Performed By: Clinician Carlene Coria, RN Compression Type: Three Layer Post Procedure Diagnosis Same as Pre-procedure Electronic Signature(s) Signed: 09/30/2020 7:11:39 PM By: Carlene Coria RN Entered By: Carlene Coria on 09/30/2020 10:27:40 [place], [name] ([id number]) -------------------------------------------------------------------------------- Compression Therapy Details Patient Name: [name] Date of Service: 09/30/2020 8:45  AM Medical Record Number: [id number] Patient Account Number: [id number] Date of Birth/Sex: [birth year] (55 y.o. F) Treating RN: Carlene Coria Primary Care Latasha Buczkowski: Laurance Flatten Other Clinician: Referring Ryllie Nieland: Referral, Self Treating Maecy Podgurski/Extender: Skipper Cliche in Treatment: 0 Compression Therapy Performed for Wound Assessment: Non-Wound Location Performed By: Jake Church, RN Compression Type: Three Layer Location: Lower Extremity, Right Post Procedure Diagnosis Same as Pre-procedure Electronic Signature(s) Signed: 09/30/2020 7:11:39 PM By: Carlene Coria RN Entered By: Carlene Coria on 09/30/2020 10:28:00 Nelles, [name] ([id number]) -------------------------------------------------------------------------------- Lower Extremity Assessment Details Patient Name: [name] Date of Service: 09/30/2020 8:45 AM Medical Record Number: [id number] Patient Account Number:  [id number] Date of Birth/Sex: [birth year] (54 y.o. F) Treating RN: Carlene Coria Primary Care Avarie Tavano: Laurance Flatten Other Clinician: Referring [name] Maffei: Referral, Self Treating Roxas Clymer/Extender: Skipper Cliche in Treatment: 0 Edema Assessment Assessed: [Left: No] [Right: No] [Left: Edema] [Right: :] Calf Left: Right: Point of Measurement: 37 cm From Medial Instep 48 cm Ankle Left: Right: Point of Measurement: 9 cm From Medial Instep 27 cm Knee To Floor Left: Right: From Medial Instep 45 cm Vascular Assessment Pulses: Dorsalis Pedis Palpable: [Left:Yes] Electronic Signature(s) Signed: 09/30/2020 7:11:39 PM By: Carlene Coria RN Entered By: Carlene Coria on 09/30/2020 09:58:42 Braggs, Victorious ([id number]) -------------------------------------------------------------------------------- Multi Wound Chart Details Patient Name: [name] Date of Service: 09/30/2020 8:45 AM Medical Record Number: [id number] Patient Account Number: [id number] Date of Birth/Sex: 1965/09/25 (55 y.o. F) Treating  RN: Carlene Coria Primary Care Raeqwon Lux: Laurance Flatten Other Clinician: Referring Chela Sutphen: Referral, Self Treating Lennox Leikam/Extender: Skipper Cliche in Treatment: 0 Vital Signs Height(in): Pulse(bpm): 37 Weight(lbs): Blood Pressure(mmHg): 157/65 Body Mass Index(BMI): Temperature(F): 99.1 Respiratory Rate(breaths/min): 18 Photos: [N/A:N/A] Wound Location: Lower Leg Left, Posterior Lower Leg N/A Wounding Event: Gradually Appeared Gradually Appeared N/A Primary Etiology: Diabetic Wound/Ulcer of the Lower Diabetic Wound/Ulcer of the Lower N/A Extremity Extremity Comorbid History: Congestive Heart Failure, Coronary Congestive Heart Failure, Coronary N/A Artery Disease, Hypertension, Artery Disease, Hypertension, Myocardial Infarction, Peripheral Myocardial Infarction, Peripheral Venous Disease, Type II Diabetes, Venous Disease, Type II Diabetes, End Stage Renal Disease, End Stage Renal Disease, Neuropathy Neuropathy Date Acquired: 09/20/2020 09/20/2020 N/A Weeks of Treatment: 0 0 N/A Wound Status: Open Open N/A Measurements L x W x D (cm) 11x4x0.1 2.5x1.5x0.1 N/A Area (cm) : 34.558 2.945 N/A Volume (cm) : 3.456 0.295 N/A Classification: Grade 2 Grade 2 N/A Exudate Amount: Medium Medium N/A Exudate Type: Serosanguineous Serosanguineous N/A Exudate Color: red, brown red, brown N/A Granulation Amount: Small (1-33%) Small (1-33%) N/A Granulation Quality: Red Pink N/A Necrotic Amount: Large (67-100%) Large (67-100%) N/A Exposed Structures: Fat Layer (Subcutaneous Tissue): Fat Layer (Subcutaneous Tissue): N/A Yes Yes Fascia: No Fascia: No Tendon: No Tendon: No Muscle: No Muscle: No Joint: No Joint: No Bone: No Bone: No Epithelialization: None None N/A Treatment Notes Electronic Signature(s) Signed: 09/30/2020 7:11:39 PM By: Carlene Coria RN Entered By: Carlene Coria on 09/30/2020 10:23:05 Speckman, [name]  ([id number]) -------------------------------------------------------------------------------- Multi-Disciplinary Care Plan Details Patient Name: [name] Date of Service: 09/30/2020 8:45 AM Medical Record Number: [id number] Patient Account Number: [id number] Date of Birth/Sex: 1965/12/23 (55 y.o. F) Treating RN: Carlene Coria Primary Care Tenoch Mcclure: Laurance Flatten Other Clinician: Referring Corretta Munce: Referral, Self Treating Raya Mckinstry/Extender: Skipper Cliche in Treatment: 0 Active Inactive Abuse / Safety / Falls / Self Care Management Nursing Diagnoses: Potential for injury related to falls Goals: Patient will remain injury free related to falls Date Initiated: 09/30/2020 Target Resolution Date: 10/31/2020 Goal Status: Active Interventions: Assess Activities of Daily Living upon admission and as needed Assess fall risk on admission and as needed Assess: immobility, friction, shearing, incontinence upon admission and as needed Assess impairment of mobility on admission and as needed per policy Assess personal safety and home safety (as indicated) on admission and as needed Assess self care needs on admission and as needed Notes: Medication Nursing Diagnoses: Knowledge deficit related to medication safety: actual or potential Goals: Patient/caregiver will demonstrate understanding of all current medications Date Initiated: 09/30/2020 Target Resolution Date: 10/31/2020 Goal Status: Active Interventions: Assess for medication contraindications each visit where new medications are prescribed Assess patient/caregiver ability to manage medication regimen upon admission and as  needed Notes: Nutrition Nursing Diagnoses: Potential for alteratiion in Nutrition/Potential for imbalanced nutrition Goals: Patient/caregiver will maintain therapeutic glucose control Date Initiated: 09/30/2020 Target Resolution Date: 10/31/2020 Goal Status: Active Interventions: Assess HgA1c results as  ordered upon admission and as needed Assess patient nutrition upon admission and as needed per policy Notes: Electronic Signature(s) REHANA, UNCAPHER ([id number]) Signed: 09/30/2020 7:11:39 PM By: Carlene Coria RN Entered By: Carlene Coria on 09/30/2020 10:22:09 Vanderstelt, Fallynn ([id number]) -------------------------------------------------------------------------------- Pain Assessment Details Patient Name: [name] Date of Service: 09/30/2020 8:45 AM Medical Record Number: [id number] Patient Account Number: [id number] Date of Birth/Sex: 1965-09-11 (55 y.o. F) Treating RN: Carlene Coria Primary Care Ollis Daudelin: Laurance Flatten Other Clinician: Referring Damontay Alred: Referral, Self Treating Romelia Bromell/Extender: Skipper Cliche in Treatment: 0 Active Problems Location of Pain Severity and Description of Pain Patient Has Paino Yes Site Locations With Dressing Change: Yes Duration of the Pain. Constant / Intermittento Constant Rate the pain. Current Pain Level: 10 Worst Pain Level: 10 Least Pain Level: 9 Character of Pain Describe the Pain: Aching, Burning Pain Management and Medication Current Pain Management: Medication: Yes Cold Application: No Rest: Yes Massage: No Activity: No T.E.N.S.: No Heat Application: No Leg drop or elevation: No Is the Current Pain Management Adequate: Inadequate How does your wound impact your activities of daily livingo Sleep: Yes Bathing: No Appetite: No Relationship With Others: No Bladder Continence: No Emotions: No Bowel Continence: No Work: No Toileting: No Drive: No Dressing: No Hobbies: No Electronic Signature(s) Signed: 09/30/2020 7:11:39 PM By: Carlene Coria RN Entered By: Carlene Coria on 09/30/2020 09:49:57 Kersting, [name] ([id number]) -------------------------------------------------------------------------------- Patient/Caregiver Education Details Patient Name: [name] Date of Service: 09/30/2020 8:45 AM Medical Record  Number: [id number] Patient Account Number: [id number] Date of Birth/Gender: [birth year] (55 y.o. F) Treating RN: Carlene Coria Primary Care Physician: Laurance Flatten Other Clinician: Referring Physician: Referral, Self Treating Physician/Extender: Skipper Cliche in Treatment: 0 Education Assessment Education Provided To: Patient Education Topics Provided Wound/Skin Impairment: Methods: Explain/Verbal Responses: State content correctly Electronic Signature(s) Signed: 09/30/2020 7:11:39 PM By: Carlene Coria RN Entered By: Carlene Coria on 09/30/2020 10:28:16 Garcia, Addisen ([id number]) -------------------------------------------------------------------------------- Wound Assessment Details Patient Name: [name] Date of Service: 09/30/2020 8:45 AM Medical Record Number: [id number] Patient Account Number: [id number] Date of Birth/Sex: 1965-04-14 (55 y.o. F) Treating RN: Carlene Coria Primary Care Marcee Jacobs: Laurance Flatten Other Clinician: Referring Graysen Woodyard: Referral, Self Treating Hennesy Sobalvarro/Extender: Skipper Cliche in Treatment: 0 Wound Status Wound Number: 7 Primary Diabetic Wound/Ulcer of the Lower Extremity Etiology: Wound Location: Lower Leg Wound Open Wounding Event: Gradually Appeared Status: Date Acquired: 09/20/2020 Comorbid Congestive Heart Failure, Coronary Artery Disease, Weeks Of Treatment: 0 History: Hypertension, Myocardial Infarction, Peripheral Venous Clustered Wound: No Disease, Type II Diabetes, End Stage Renal Disease, Neuropathy Photos Wound Measurements Length: (cm) 11 Width: (cm) 4 Depth: (cm) 0.1 Area: (cm) 34.558 Volume: (cm) 3.456 % Reduction in Area: % Reduction in Volume: Epithelialization: None Tunneling: No Undermining: No Wound Description Classification: Grade 2 Exudate Amount: Medium Exudate Type: Serosanguineous Exudate Color: red, brown Foul Odor After Cleansing: No Slough/Fibrino Yes Wound Bed Granulation Amount: Small (1-33%)  Exposed Structure Granulation Quality: Red Fascia Exposed: No Necrotic Amount: Large (67-100%) Fat Layer (Subcutaneous Tissue) Exposed: Yes Necrotic Quality: Adherent Slough Tendon Exposed: No Muscle Exposed: No Joint Exposed: No Bone Exposed: No Electronic Signature(s) Signed: 09/30/2020 7:11:39 PM By: Carlene Coria RN Entered By: Carlene Coria on 09/30/2020 10:01:09 Lie, Jeneen ([id number]) -------------------------------------------------------------------------------- Wound Assessment Details Patient Name: [name] Date of Service: 09/30/2020 8:45 AM Medical  Record Number: [id number] Patient Account Number: [id number] Date of Birth/Sex: 01-Mar-1966 (55 y.o. F) Treating RN: Carlene Coria Primary Care Benna Arno: Laurance Flatten Other Clinician: Referring Tannya Gonet: Referral, Self Treating Pati Thinnes/Extender: Skipper Cliche in Treatment: 0 Wound Status Wound Number: 8 Primary Diabetic Wound/Ulcer of the Lower Extremity Etiology: Wound Location: Left, Posterior Lower Leg Wound Open Wounding Event: Gradually Appeared Status: Date Acquired: 09/20/2020 Comorbid Congestive Heart Failure, Coronary Artery Disease, Weeks Of Treatment: 0 History: Hypertension, Myocardial Infarction, Peripheral Venous Clustered Wound: No Disease, Type II Diabetes, End Stage Renal Disease, Neuropathy Photos Wound Measurements Length: (cm) 2.5 Width: (cm) 1.5 Depth: (cm) 0.1 Area: (cm) 2.945 Volume: (cm) 0.295 % Reduction in Area: % Reduction in Volume: Epithelialization: None Tunneling: No Undermining: No Wound Description Classification: Grade 2 Exudate Amount: Medium Exudate Type: Serosanguineous Exudate Color: red, brown Foul Odor After Cleansing: No Slough/Fibrino Yes Wound Bed Granulation Amount: Small (1-33%) Exposed Structure Granulation Quality: Pink Fascia Exposed: No Necrotic Amount: Large (67-100%) Fat Layer (Subcutaneous Tissue) Exposed: Yes Necrotic Quality: Adherent  Slough Tendon Exposed: No Muscle Exposed: No Joint Exposed: No Bone Exposed: No Electronic Signature(s) Signed: 09/30/2020 7:11:39 PM By: Carlene Coria RN Entered By: Carlene Coria on 09/30/2020 10:02:29 Murtaugh, [name] ([id number]) -------------------------------------------------------------------------------- Vitals Details Patient Name: [name] Date of Service: 09/30/2020 8:45 AM Medical Record Number: [id number] Patient Account Number: [id number] Date of Birth/Sex: 06/09/1965 (55 y.o. F) Treating RN: Carlene Coria Primary Care Marypat Kimmet: Laurance Flatten Other Clinician: Referring Arlissa Monteverde: Referral, Self Treating Monseratt Ledin/Extender: Skipper Cliche in Treatment: 0 Vital Signs Time Taken: 09:30 Temperature (F): 99.1 Pulse (bpm): 82 Respiratory Rate (breaths/min): 18 Blood Pressure (mmHg): 157/65 Reference Range: 80 - 120 mg / dl Electronic Signature(s) Signed: 09/30/2020 7:11:39 PM By: Carlene Coria RN Entered By: Carlene Coria on 09/30/2020 09:48:05

## 2020-10-01 NOTE — Progress Notes (Signed)
RALPHINE, HINKS ([id number]) Visit Report for 09/30/2020 Abuse/Suicide Risk Screen Details Patient Name: [name], [name] Date of Service: 09/30/2020 8:45 AM Medical Record Number: [id number] Patient Account Number: [id number] Date of Birth/Sex: 1965/11/15 (55 y.o. F) Treating RN: Carlene Coria Primary Care Tramain Gershman: Laurance Flatten Other Clinician: Referring [name] [name]: Referral, Self Treating Kalib Bhagat/Extender: Skipper Cliche in Treatment: 0 Abuse/Suicide Risk Screen Items Answer ABUSE RISK SCREEN: Has anyone close to you tried to hurt or harm you recentlyo No Do you feel uncomfortable with anyone in your familyo No Has anyone forced you do things that you didnot want to doo No Electronic Signature(s) Signed: 09/30/2020 7:11:39 PM By: Carlene Coria RN Entered By: Carlene Coria on 09/30/2020 09:50:35 Henricksen, [name] ([id number]) -------------------------------------------------------------------------------- Activities of Daily Living Details Patient Name: [name] Date of Service: 09/30/2020 8:45 AM Medical Record Number: [id number] Patient Account Number: [id number] Date of Birth/Sex: February 09, 1966 (55 y.o. F) Treating RN: Carlene Coria Primary Care Kamika Goodloe: Laurance Flatten Other Clinician: Referring Lener Ventresca: Referral, Self Treating Wrenly Lauritsen/Extender: Skipper Cliche in Treatment: 0 Activities of Daily Living Items Answer Activities of Daily Living (Please select one for each item) Drive Automobile Not Able Take Medications Completely Able Use Telephone Completely Able Care for Appearance Completely Able Use Toilet Completely Able Bath / Shower Completely Able Dress Self Completely Able Feed Self Completely Able Walk Completely Able Get In / Out Bed Completely Able Housework Need Assistance Prepare Meals Need Assistance Handle Money Need Assistance Shop for Self Not Able Electronic Signature(s) Signed: 09/30/2020 7:11:39 PM By: Carlene Coria RN Entered By: Carlene Coria on  09/30/2020 09:51:06 Kolker, [name] ([id number]) -------------------------------------------------------------------------------- Education Screening Details Patient Name: [name] Date of Service: 09/30/2020 8:45 AM Medical Record Number: [id number] Patient Account Number: [id number] Date of Birth/Sex: 10/02/1965 (55 y.o. F) Treating RN: Carlene Coria Primary Care Tanay Massiah: Laurance Flatten Other Clinician: Referring Ireanna Finlayson: Referral, Self Treating Jaydien Panepinto/Extender: Skipper Cliche in Treatment: 0 Learning Preferences/Education Level/Primary Language Learning Preference: Explanation Highest Education Level: High School Preferred Language: English Cognitive Barrier Language Barrier: No Translator Needed: No Memory Deficit: No Emotional Barrier: No Cultural/Religious Beliefs Affecting Medical Care: No Physical Barrier Impaired Vision: Yes Glasses Impaired Hearing: No Decreased Hand dexterity: No Knowledge/Comprehension Knowledge Level: Medium Comprehension Level: High Ability to understand written instructions: High Ability to understand verbal instructions: High Motivation Anxiety Level: Anxious Cooperation: Cooperative Education Importance: Acknowledges Need Interest in Health Problems: Asks Questions Perception: Coherent Willingness to Engage in Self-Management High Activities: Readiness to Engage in Self-Management High Activities: Electronic Signature(s) Signed: 09/30/2020 7:11:39 PM By: Carlene Coria RN Entered By: Carlene Coria on 09/30/2020 09:51:35 [place], Stuti ([id number]) -------------------------------------------------------------------------------- Fall Risk Assessment Details Patient Name: [name] Date of Service: 09/30/2020 8:45 AM Medical Record Number: [id number] Patient Account Number: [id number] Date of Birth/Sex: [birth year] (55 y.o. F) Treating RN: Carlene Coria Primary Care Jibreel Fedewa: Laurance Flatten Other Clinician: Referring Jacarius Handel:  Referral, Self Treating Malanie Koloski/Extender: Skipper Cliche in Treatment: 0 Fall Risk Assessment Items Have you had 2 or more falls in the last 12 monthso 0 Yes Have you had any fall that resulted in injury in the last 12 monthso 0 Yes FALLS RISK SCREEN History of falling - immediate or within 3 months 25 Yes Secondary diagnosis (Do you have 2 or more medical diagnoseso) 15 Yes Ambulatory aid None/bed rest/wheelchair/nurse 0 No Crutches/cane/walker 0 No Furniture 0 No Intravenous therapy Access/Saline/Heparin Lock 0 No Gait/Transferring Normal/ bed rest/ wheelchair 0 No Weak (short steps with or without shuffle, stooped but able to lift head while walking, may  0 No seek support from furniture) Impaired (short steps with shuffle, may have difficulty arising from chair, head down, impaired 0 No balance) Mental Status Oriented to own ability 0 No Electronic Signature(s) Signed: 09/30/2020 7:11:39 PM By: Carlene Coria RN Entered By: Carlene Coria on 09/30/2020 09:51:48 Loe, Jerrine ([id number]) -------------------------------------------------------------------------------- Foot Assessment Details Patient Name: [name] Date of Service: 09/30/2020 8:45 AM Medical Record Number: [id number] Patient Account Number: [id number] Date of Birth/Sex: 1966-01-26 (55 y.o. F) Treating RN: Carlene Coria Primary Care Howie Rufus: Laurance Flatten Other Clinician: Referring Omkar Stratmann: Referral, Self Treating Aziah Brostrom/Extender: Skipper Cliche in Treatment: 0 Foot Assessment Items Site Locations + = Sensation present, - = Sensation absent, C = Callus, U = Ulcer R = Redness, W = Warmth, M = Maceration, PU = Pre-ulcerative lesion F = Fissure, S = Swelling, D = Dryness Assessment Right: Left: Other Deformity: No No Prior Foot Ulcer: No No Prior Amputation: No No Charcot Joint: No No Ambulatory Status: Ambulatory Without Help Gait: Steady Electronic Signature(s) Signed: 09/30/2020 7:11:39 PM  By: Carlene Coria RN Entered By: Carlene Coria on 09/30/2020 09:57:28 [place], Riane ([id number]) -------------------------------------------------------------------------------- Nutrition Risk Screening Details Patient Name: ANETTA, OLVERA Date of Service: 09/30/2020 8:45 AM Medical Record Number: [id number] Patient Account Number: [id number] Date of Birth/Sex: [birth year] (55 y.o. F) Treating RN: Carlene Coria Primary Care Almeter Westhoff: Laurance Flatten Other Clinician: Referring Kase Shughart: Referral, Self Treating Jammie Clink/Extender: Skipper Cliche in Treatment: 0 Height (in): Weight (lbs): Body Mass Index (BMI): Nutrition Risk Screening Items Score Screening NUTRITION RISK SCREEN: I have an illness or condition that made me change the kind and/or amount of food I eat 0 No I eat fewer than two meals per day 0 No I eat few fruits and vegetables, or milk products 0 No I have three or more drinks of beer, liquor or wine almost every day 0 No I have tooth or mouth problems that make it hard for me to eat 0 No I don't always have enough money to buy the food I need 0 No I eat alone most of the time 0 No I take three or more different prescribed or over-the-counter drugs a day 1 Yes Without wanting to, I have lost or gained 10 pounds in the last six months 0 No I am not always physically able to shop, cook and/or feed myself 2 Yes Nutrition Protocols Good Risk Protocol Moderate Risk Protocol 0 Provide education on nutrition High Risk Proctocol Risk Level: Moderate Risk Score: 3 Electronic Signature(s) Signed: 09/30/2020 7:11:39 PM By: Carlene Coria RN Entered By: Carlene Coria on 09/30/2020 09:52:05

## 2020-10-04 ENCOUNTER — Other Ambulatory Visit: Payer: Self-pay

## 2020-10-04 DIAGNOSIS — E11622 Type 2 diabetes mellitus with other skin ulcer: Secondary | ICD-10-CM | POA: Diagnosis not present

## 2020-10-04 NOTE — Progress Notes (Signed)
KENEDY, [name] ([id number]) Visit Report for 10/04/2020 Arrival Information Details Patient Name: [name], [name] Date of Service: 10/04/2020 10:30 AM Medical Record Number: [id number] Patient Account Number: [id number] Date of Birth/Sex: 01-Oct-1965 (55 y.o. F) Treating RN: Cornell Barman Primary Care Spero Gunnels: Laurance Flatten Other Clinician: Referring Tomasa Dobransky: Laurance Flatten Treating Adaiah Morken/Extender: Skipper Cliche in Treatment: 0 Visit Information History Since Last Visit Added or deleted any medications: No Patient Arrived: Ambulatory Has Dressing in Place as Prescribed: Yes Arrival Time: 10:47 Has Compression in Place as Prescribed: Yes Accompanied By: self Pain Present Now: No Transfer Assistance: None Patient Identification Verified: Yes Secondary Verification Process Completed: Yes Patient Requires Transmission-Based Precautions: No Patient Has Alerts: No Electronic Signature(s) Signed: 10/04/2020 12:20:42 PM By: Gretta Cool, BSN, RN, CWS, Kim RN, BSN Entered By: Gretta Cool, BSN, RN, CWS, Kim on 10/04/2020 11:14:31 Steeber, [name] ([id number]) -------------------------------------------------------------------------------- Compression Therapy Details Patient Name: [name] Date of Service: 10/04/2020 10:30 AM Medical Record Number: [id number] Patient Account Number: [id number] Date of Birth/Sex: [birth year] (55 y.o. F) Treating RN: Cornell Barman Primary Care Finn Altemose: Laurance Flatten Other Clinician: Referring Alvan Culpepper: Laurance Flatten Treating Al Gagen/Extender: Jeri Cos Weeks in Treatment: 0 Compression Therapy Performed for Wound Assessment: Wound #7 Left,Anterior Lower Leg Performed By: Clinician Cornell Barman, RN Compression Type: Three Hydrologist) Signed: 10/04/2020 12:20:42 PM By: Gretta Cool, BSN, RN, CWS, Kim RN, BSN Entered By: Gretta Cool, BSN, RN, CWS, Kim on 10/04/2020 11:15:21 [place], [name]  ([id number]) -------------------------------------------------------------------------------- Compression Therapy Details Patient Name: [name] Date of Service: 10/04/2020 10:30 AM Medical Record Number: [id number] Patient Account Number: [id number] Date of Birth/Sex: Jul 31, 1965 (55 y.o. F) Treating RN: Cornell Barman Primary Care Taylour Lietzke: Laurance Flatten Other Clinician: Referring Jeraldin Fesler: Laurance Flatten Treating Arlester Keehan/Extender: Jeri Cos Weeks in Treatment: 0 Compression Therapy Performed for Wound Assessment: Wound #8 Left,Posterior Lower Leg Performed By: Clinician Cornell Barman, RN Compression Type: Three Hydrologist) Signed: 10/04/2020 12:20:42 PM By: Gretta Cool, BSN, RN, CWS, Kim RN, BSN Entered By: Gretta Cool, BSN, RN, CWS, Kim on 10/04/2020 11:15:22 Tones, [name] ([id number]) -------------------------------------------------------------------------------- Compression Therapy Details Patient Name: [name] Date of Service: 10/04/2020 10:30 AM Medical Record Number: [id number] Patient Account Number: [id number] Date of Birth/Sex: 1966-02-02 (55 y.o. F) Treating RN: Cornell Barman Primary Care Adlee Paar: Laurance Flatten Other Clinician: Referring Sotero Brinkmeyer: Laurance Flatten Treating Layanna Charo/Extender: Jeri Cos Weeks in Treatment: 0 Compression Therapy Performed for Wound Assessment: NonWound Condition Lymphedema - Right Leg Performed By: Clinician Cornell Barman, RN Compression Type: Three Hydrologist) Signed: 10/04/2020 12:20:42 PM By: Gretta Cool, BSN, RN, CWS, Kim RN, BSN Entered By: Gretta Cool, BSN, RN, CWS, Kim on 10/04/2020 11:15:37 JANEICE, STEGALL ([id number]) -------------------------------------------------------------------------------- Encounter Discharge Information Details Patient Name: [name] Date of Service: 10/04/2020 10:30 AM Medical Record Number: [id number] Patient Account Number: [id number] Date of Birth/Sex: Aug 27, 1965 (55 y.o. F) Treating RN:  Cornell Barman Primary Care Aaryn Sermon: Laurance Flatten Other Clinician: Referring Laury Huizar: Laurance Flatten Treating Emillio Ngo/Extender: Skipper Cliche in Treatment: 0 Encounter Discharge Information Items Discharge Condition: Stable Ambulatory Status: Ambulatory Discharge Destination: Home Transportation: Private Auto Accompanied By: self Schedule Follow-up Appointment: Yes Clinical Summary of Care: Electronic Signature(s) Signed: 10/04/2020 12:20:42 PM By: Gretta Cool, BSN, RN, CWS, Kim RN, BSN Entered By: Gretta Cool, BSN, RN, CWS, Kim on 10/04/2020 11:16:08 [place], [name] ([id number]) -------------------------------------------------------------------------------- Wound Assessment Details Patient Name: [name] Date of Service: 10/04/2020 10:30 AM Medical Record Number: [id number] Patient Account Number: [id number] Date of Birth/Sex: Oct 24, 1965 (55 y.o. F) Treating RN: Cornell Barman Primary Care Pauline Pegues: Laurance Flatten Other Clinician: Referring Detrell Umscheid: Laurance Flatten Treating  Yenesis Even/Extender: Jeri Cos Weeks in Treatment: 0 Wound Status Wound Number: 7 Primary Etiology: Diabetic Wound/Ulcer of the Lower Extremity Wound Location: Left, Anterior Lower Leg Wound Status: Open Wounding Event: Gradually Appeared Date Acquired: 09/20/2020 Weeks Of Treatment: 0 Clustered Wound: No Wound Measurements Length: (cm) 10 Width: (cm) 5 Depth: (cm) 0.1 Area: (cm) 39.27 Volume: (cm) 3.927 % Reduction in Area: -13.6% % Reduction in Volume: -13.6% Wound Description Classification: Grade 2 Treatment Notes Wound #7 (Lower Leg) Wound Laterality: Left, Anterior Cleanser Soap and Water Discharge Instruction: Gently cleanse wound with antibacterial soap, rinse and pat dry prior to dressing wounds Peri-Wound Care Topical Primary Dressing Silvercel 4 1/4x 4 1/4 (in/in) Discharge Instruction: Apply Silvercel 4 1/4x 4 1/4 (in/in) as instructed Secondary Dressing ABD Pad 5x9 (in/in) Discharge Instruction:  Cover with ABD pad Secured With Compression Wrap Profore Lite LF 3 Multilayer Compression Bandaging System Discharge Instruction: Apply 3 multi-layer wrap as prescribed. Compression Stockings Environmental education officer) Signed: 10/04/2020 12:20:42 PM By: Gretta Cool, BSN, RN, CWS, Kim RN, BSN Entered By: Gretta Cool, BSN, RN, CWS, Kim on 10/04/2020 10:57:50 Nickle, [name] ([id number]) -------------------------------------------------------------------------------- Wound Assessment Details Patient Name: [name] Date of Service: 10/04/2020 10:30 AM Medical Record Number: [id number] Patient Account Number: [id number] Date of Birth/Sex: 1966/02/03 (55 y.o. F) Treating RN: Cornell Barman Primary Care Joe Gee: Laurance Flatten Other Clinician: Referring Calvin Chura: Laurance Flatten Treating Brytani Voth/Extender: Jeri Cos Weeks in Treatment: 0 Wound Status Wound Number: 8 Primary Etiology: Diabetic Wound/Ulcer of the Lower Extremity Wound Location: Left, Posterior Lower Leg Wound Status: Open Wounding Event: Gradually Appeared Date Acquired: 09/20/2020 Weeks Of Treatment: 0 Clustered Wound: No Wound Measurements Length: (cm) 2 Width: (cm) 4 Depth: (cm) 0.1 Area: (cm) 6.283 Volume: (cm) 0.628 % Reduction in Area: -113.3% % Reduction in Volume: -112.9% Wound Description Classification: Grade 2 Treatment Notes Wound #8 (Lower Leg) Wound Laterality: Left, Posterior Cleanser Soap and Water Discharge Instruction: Gently cleanse wound with antibacterial soap, rinse and pat dry prior to dressing wounds Peri-Wound Care Topical Primary Dressing Silvercel 4 1/4x 4 1/4 (in/in) Discharge Instruction: Apply Silvercel 4 1/4x 4 1/4 (in/in) as instructed Secondary Dressing ABD Pad 5x9 (in/in) Discharge Instruction: Cover with ABD pad Secured With Compression Wrap Profore Lite LF 3 Multilayer Compression Bandaging System Discharge Instruction: Apply 3 multi-layer wrap as prescribed. Compression  Stockings Add-Ons Electronic Signature(s) Signed: 10/04/2020 12:20:42 PM By: Gretta Cool, BSN, RN, CWS, Kim RN, BSN Entered By: Gretta Cool, BSN, RN, CWS, Kim on 10/04/2020 10:57:50

## 2020-10-07 ENCOUNTER — Other Ambulatory Visit: Payer: Self-pay

## 2020-10-07 ENCOUNTER — Encounter: Payer: Medicare Other | Admitting: Physician Assistant

## 2020-10-07 DIAGNOSIS — E11622 Type 2 diabetes mellitus with other skin ulcer: Secondary | ICD-10-CM | POA: Diagnosis not present

## 2020-10-07 NOTE — Progress Notes (Signed)
METHA, KOLASA ([id number]) Visit Report for 10/07/2020 Arrival Information Details Patient Name: [name], [name] Date of Service: 10/07/2020 11:00 AM Medical Record Number: [id number] Patient Account Number: [id number] Date of Birth/Sex: [birth year] (55 y.o. F) Treating RN: Carlene Coria Primary Care Seriyah Collison: Laurance Flatten Other Clinician: Referring Eirik Schueler: Laurance Flatten Treating Gerlad Pelzel/Extender: Skipper Cliche in Treatment: 1 Visit Information History Since Last Visit All ordered tests and consults were completed: No Patient Arrived: Ambulatory Added or deleted any medications: No Arrival Time: 11:02 Any new allergies or adverse reactions: No Accompanied By: self Had a fall or experienced change in No Transfer Assistance: None activities of daily living that may affect Patient Identification Verified: Yes risk of falls: Secondary Verification Process Completed: Yes Signs or symptoms of abuse/neglect since last visito No Patient Requires Transmission-Based Precautions: No Hospitalized since last visit: No Patient Has Alerts: No Implantable device outside of the clinic excluding No cellular tissue based products placed in the center since last visit: Has Dressing in Place as Prescribed: Yes Has Compression in Place as Prescribed: Yes Pain Present Now: No Electronic Signature(s) Signed: 10/07/2020 2:11:15 PM By: Carlene Coria RN Entered By: Carlene Coria on 10/07/2020 11:03:41 Beehler, Kenedi ([id number]) -------------------------------------------------------------------------------- Clinic Level of Care Assessment Details Patient Name: [name], [name] Date of Service: 10/07/2020 11:00 AM Medical Record Number: [id number] Patient Account Number: [id number] Date of Birth/Sex: Dec 15, 1965 (55 y.o. F) Treating RN: Carlene Coria Primary Care Fortune Brannigan: Laurance Flatten Other Clinician: Referring Kyllian Clingerman: Laurance Flatten Treating Arlo Butt/Extender: Skipper Cliche in Treatment: 1 Clinic  Level of Care Assessment Items TOOL 1 Quantity Score []  - Use when EandM and Procedure is performed on INITIAL visit 0 ASSESSMENTS - Nursing Assessment / Reassessment []  - General Physical Exam (combine w/ comprehensive assessment (listed just below) when performed on new 0 pt. evals) []  - 0 Comprehensive Assessment (HX, ROS, Risk Assessments, Wounds Hx, etc.) ASSESSMENTS - Wound and Skin Assessment / Reassessment []  - Dermatologic / Skin Assessment (not related to wound area) 0 ASSESSMENTS - Ostomy and/or Continence Assessment and Care []  - Incontinence Assessment and Management 0 []  - 0 Ostomy Care Assessment and Management (repouching, etc.) PROCESS - Coordination of Care []  - Simple Patient / Family Education for ongoing care 0 []  - 0 Complex (extensive) Patient / Family Education for ongoing care []  - 0 Staff obtains Programmer, systems, Records, Test Results / Process Orders []  - 0 Staff telephones HHA, Nursing Homes / Clarify orders / etc []  - 0 Routine Transfer to another Facility (non-emergent condition) []  - 0 Routine Hospital Admission (non-emergent condition) []  - 0 New Admissions / Biomedical engineer / Ordering NPWT, Apligraf, etc. []  - 0 Emergency Hospital Admission (emergent condition) PROCESS - Special Needs []  - Pediatric / Minor Patient Management 0 []  - 0 Isolation Patient Management []  - 0 Hearing / Language / Visual special needs []  - 0 Assessment of Community assistance (transportation, D/C planning, etc.) []  - 0 Additional assistance / Altered mentation []  - 0 Support Surface(s) Assessment (bed, cushion, seat, etc.) INTERVENTIONS - Miscellaneous []  - External ear exam 0 []  - 0 Patient Transfer (multiple staff / Civil Service fast streamer / Similar devices) []  - 0 Simple Staple / Suture removal (25 or less) []  - 0 Complex Staple / Suture removal (26 or more) []  - 0 Hypo/Hyperglycemic Management (do not check if billed separately) []  - 0 Ankle / Brachial Index  (ABI) - do not check if billed separately Has the patient been seen at the hospital within the last three years: Yes Total  Score: 0 Level Of Care: ____ [name] ([id number]) Electronic Signature(s) Signed: 10/07/2020 2:11:15 PM By: Carlene Coria RN Entered By: Carlene Coria on 10/07/2020 11:32:35 Wolfert, [name] ([id number]) -------------------------------------------------------------------------------- Compression Therapy Details Patient Name: [name] Date of Service: 10/07/2020 11:00 AM Medical Record Number: [id number] Patient Account Number: [id number] Date of Birth/Sex: 1965-10-06 (55 y.o. F) Treating RN: Carlene Coria Primary Care Rhetta Cleek: Laurance Flatten Other Clinician: Referring Tracey Hermance: Laurance Flatten Treating Lucetta Baehr/Extender: Skipper Cliche in Treatment: 1 Compression Therapy Performed for Wound Assessment: Wound #7 Left,Anterior Lower Leg Performed By: Clinician Carlene Coria, RN Compression Type: Three Layer Post Procedure Diagnosis Same as Pre-procedure Electronic Signature(s) Signed: 10/07/2020 2:11:15 PM By: Carlene Coria RN Entered By: Carlene Coria on 10/07/2020 11:31:49 Charrier, [name] ([id number]) -------------------------------------------------------------------------------- Compression Therapy Details Patient Name: [name] Date of Service: 10/07/2020 11:00 AM Medical Record Number: [id number] Patient Account Number: [id number] Date of Birth/Sex: [birth year] (55 y.o. F) Treating RN: Carlene Coria Primary Care Darrio Bade: Laurance Flatten Other Clinician: Referring Zoriyah Scheidegger: Laurance Flatten Treating Denzel Etienne/Extender: Skipper Cliche in Treatment: 1 Compression Therapy Performed for Wound Assessment: Wound #8 Left,Posterior Lower Leg Performed By: Clinician Carlene Coria, RN Compression Type: Three Layer Post Procedure Diagnosis Same as Pre-procedure Electronic Signature(s) Signed: 10/07/2020 2:11:15 PM By: Carlene Coria RN Entered By: Carlene Coria on  10/07/2020 11:31:49 Hucker, [name] ([id number]) -------------------------------------------------------------------------------- Encounter Discharge Information Details Patient Name: [name] Date of Service: 10/07/2020 11:00 AM Medical Record Number: [id number] Patient Account Number: [id number] Date of Birth/Sex: 07-May-1965 (55 y.o. F) Treating RN: Carlene Coria Primary Care Casimiro Lienhard: Laurance Flatten Other Clinician: Referring Peace Noyes: Laurance Flatten Treating Sabrea Sankey/Extender: Skipper Cliche in Treatment: 1 Encounter Discharge Information Items Discharge Condition: Stable Ambulatory Status: Ambulatory Discharge Destination: Home Transportation: Private Auto Accompanied By: self Schedule Follow-up Appointment: Yes Clinical Summary of Care: Patient Declined Electronic Signature(s) Signed: 10/07/2020 2:11:15 PM By: Carlene Coria RN Entered By: Carlene Coria on 10/07/2020 11:48:46 Whipple, [name] ([id number]) -------------------------------------------------------------------------------- Lower Extremity Assessment Details Patient Name: [name], [name] Date of Service: 10/07/2020 11:00 AM Medical Record Number: [id number] Patient Account Number: [id number] Date of Birth/Sex: [birth year] (55 y.o. F) Treating RN: Carlene Coria Primary Care Britne Borelli: Laurance Flatten Other Clinician: Referring Cheyenna Pankowski: Laurance Flatten Treating Claudine Stallings/Extender: Jeri Cos Weeks in Treatment: 1 Edema Assessment Assessed: [Left: No] [Right: No] Edema: [Left: Ye] [Right: s] Calf Left: Right: Point of Measurement: 37 cm From Medial Instep 48 cm Ankle Left: Right: Point of Measurement: 9 cm From Medial Instep 27 cm Knee To Floor Left: Right: From Medial Instep 45 cm Vascular Assessment Pulses: Dorsalis Pedis Palpable: [Left:Yes] Electronic Signature(s) Signed: 10/07/2020 2:11:15 PM By: Carlene Coria RN Entered By: Carlene Coria on 10/07/2020 11:56:35 Ayllon, [name]  ([id number]) -------------------------------------------------------------------------------- Multi Wound Chart Details Patient Name: [name] Date of Service: 10/07/2020 11:00 AM Medical Record Number: [id number] Patient Account Number: [id number] Date of Birth/Sex: [birth year] (55 y.o. F) Treating RN: Carlene Coria Primary Care Makhari Dovidio: Laurance Flatten Other Clinician: Referring Jakyia Gaccione: Laurance Flatten Treating Merced Hanners/Extender: Jeri Cos Weeks in Treatment: 1 Vital Signs Height(in): Pulse(bpm): 70 Weight(lbs): Blood Pressure(mmHg): 127/59 Body Mass Index(BMI): Temperature(F): 98.6 Respiratory Rate(breaths/min): 18 Photos: [N/A:N/A] Wound Location: Left, Anterior Lower Leg Left, Posterior Lower Leg N/A Wounding Event: Gradually Appeared Gradually Appeared N/A Primary Etiology: Diabetic Wound/Ulcer of the Lower Diabetic Wound/Ulcer of the Lower N/A Extremity Extremity Comorbid History: Congestive Heart Failure, Coronary Congestive Heart Failure, Coronary N/A Artery Disease, Hypertension, Artery Disease, Hypertension, Myocardial Infarction, Peripheral Myocardial Infarction, Peripheral Venous Disease, Type II Diabetes, Venous Disease, Type II Diabetes, End Stage Renal  Disease, End Stage Renal Disease, Neuropathy Neuropathy Date Acquired: 09/20/2020 09/20/2020 N/A Weeks of Treatment: 1 1 N/A Wound Status: Open Open N/A Measurements L x W x D (cm) 10x5.5x1 2x4x0.1 N/A Area (cm) : 43.197 6.283 N/A Volume (cm) : 43.197 0.628 N/A % Reduction in Area: -25.00% -113.30% N/A % Reduction in Volume: -1149.90% -112.90% N/A Classification: Grade 2 Grade 2 N/A Exudate Amount: Medium Medium N/A Exudate Type: Serosanguineous Serosanguineous N/A Exudate Color: red, brown red, brown N/A Granulation Amount: Medium (34-66%) Small (1-33%) N/A Granulation Quality: N/A Pink N/A Necrotic Amount: Medium (34-66%) Large (67-100%) N/A Exposed Structures: Fat Layer (Subcutaneous Tissue): Fat Layer  (Subcutaneous Tissue): N/A Yes Yes Fascia: No Fascia: No Tendon: No Tendon: No Muscle: No Muscle: No Joint: No Joint: No Bone: No Bone: No Epithelialization: None None N/A Treatment Notes Electronic Signature(s) Signed: 10/07/2020 2:11:15 PM By: Carlene Coria RN Entered By: Carlene Coria on 10/07/2020 11:31:06 Wirsing, [name] ([id number]) [name], [name] ([id number]) -------------------------------------------------------------------------------- Multi-Disciplinary Care Plan Details Patient Name: [name] Date of Service: 10/07/2020 11:00 AM Medical Record Number: [id number] Patient Account Number: [id number] Date of Birth/Sex: 1965/10/16 (55 y.o. F) Treating RN: Carlene Coria Primary Care Rembert Browe: Laurance Flatten Other Clinician: Referring Tillie Viverette: Laurance Flatten Treating Indira Sorenson/Extender: Skipper Cliche in Treatment: 1 Active Inactive Abuse / Safety / Falls / Self Care Management Nursing Diagnoses: Potential for injury related to falls Goals: Patient will remain injury free related to falls Date Initiated: 09/30/2020 Target Resolution Date: 10/31/2020 Goal Status: Active Interventions: Assess Activities of Daily Living upon admission and as needed Assess fall risk on admission and as needed Assess: immobility, friction, shearing, incontinence upon admission and as needed Assess impairment of mobility on admission and as needed per policy Assess personal safety and home safety (as indicated) on admission and as needed Assess self care needs on admission and as needed Notes: Medication Nursing Diagnoses: Knowledge deficit related to medication safety: actual or potential Goals: Patient/caregiver will demonstrate understanding of all current medications Date Initiated: 09/30/2020 Target Resolution Date: 10/31/2020 Goal Status: Active Interventions: Assess for medication contraindications each visit where new medications are prescribed Assess patient/caregiver ability  to manage medication regimen upon admission and as needed Notes: Nutrition Nursing Diagnoses: Potential for alteratiion in Nutrition/Potential for imbalanced nutrition Goals: Patient/caregiver will maintain therapeutic glucose control Date Initiated: 09/30/2020 Target Resolution Date: 10/31/2020 Goal Status: Active Interventions: Assess HgA1c results as ordered upon admission and as needed Assess patient nutrition upon admission and as needed per policy Notes: Electronic Signature(s) [name], [name] ([id number]) Signed: 10/07/2020 2:11:15 PM By: Carlene Coria RN Entered By: Carlene Coria on 10/07/2020 11:30:55 Stefanko, [name] ([id number]) -------------------------------------------------------------------------------- Pain Assessment Details Patient Name: [name] Date of Service: 10/07/2020 11:00 AM Medical Record Number: [id number] Patient Account Number: [id number] Date of Birth/Sex: Mar 25, 1965 (55 y.o. F) Treating RN: Carlene Coria Primary Care Zeplin Aleshire: Laurance Flatten Other Clinician: Referring Melana Hingle: Laurance Flatten Treating Jasiel Apachito/Extender: Jeri Cos Weeks in Treatment: 1 Active Problems Location of Pain Severity and Description of Pain Patient Has Paino No Site Locations Pain Management and Medication Current Pain Management: Electronic Signature(s) Signed: 10/07/2020 2:11:15 PM By: Carlene Coria RN Entered By: Carlene Coria on 10/07/2020 11:04:25 Procter, [name] ([id number]) -------------------------------------------------------------------------------- Patient/Caregiver Education Details Patient Name: [name] Date of Service: 10/07/2020 11:00 AM Medical Record Number: [id number] Patient Account Number: [id number] Date of Birth/Gender: October 20, 1965 (55 y.o. F) Treating RN: Carlene Coria Primary Care Physician: Laurance Flatten Other Clinician: Referring Physician: Laurance Flatten Treating Physician/Extender: Skipper Cliche in Treatment: 1 Education Assessment Education  Provided  To: Patient Education Topics Provided Wound/Skin Impairment: Methods: Explain/Verbal Responses: State content correctly Electronic Signature(s) Signed: 10/07/2020 2:11:15 PM By: Carlene Coria RN Entered By: Carlene Coria on 10/07/2020 11:32:58 Sequeira, [name] ([id number]) -------------------------------------------------------------------------------- Wound Assessment Details Patient Name: [name] Date of Service: 10/07/2020 11:00 AM Medical Record Number: [id number] Patient Account Number: [id number] Date of Birth/Sex: 1965-09-30 (55 y.o. F) Treating RN: Carlene Coria Primary Care Neyla Gauntt: Laurance Flatten Other Clinician: Referring Jalynn Betzold: Laurance Flatten Treating Zian Delair/Extender: Jeri Cos Weeks in Treatment: 1 Wound Status Wound Number: 7 Primary Diabetic Wound/Ulcer of the Lower Extremity Etiology: Wound Location: Left, Anterior Lower Leg Wound Open Wounding Event: Gradually Appeared Status: Date Acquired: 09/20/2020 Comorbid Congestive Heart Failure, Coronary Artery Disease, Weeks Of Treatment: 1 History: Hypertension, Myocardial Infarction, Peripheral Venous Clustered Wound: No Disease, Type II Diabetes, End Stage Renal Disease, Neuropathy Photos Wound Measurements Length: (cm) 10 Width: (cm) 5.5 Depth: (cm) 1 Area: (cm) 43.197 Volume: (cm) 43.197 % Reduction in Area: -25% % Reduction in Volume: -1149.9% Epithelialization: None Tunneling: No Undermining: No Wound Description Classification: Grade 2 Exudate Amount: Medium Exudate Type: Serosanguineous Exudate Color: red, brown Foul Odor After Cleansing: No Wound Bed Granulation Amount: Medium (34-66%) Exposed Structure Necrotic Amount: Medium (34-66%) Fascia Exposed: No Necrotic Quality: Adherent Slough Fat Layer (Subcutaneous Tissue) Exposed: Yes Tendon Exposed: No Muscle Exposed: No Joint Exposed: No Bone Exposed: No Treatment Notes Wound #7 (Lower Leg) Wound Laterality: Left,  Anterior Cleanser Soap and Water Discharge Instruction: Gently cleanse wound with antibacterial soap, rinse and pat dry prior to dressing wounds Peri-Wound Care Erway, [name] ([id number]) Topical Primary Dressing Silvercel 4 1/4x 4 1/4 (in/in) Discharge Instruction: Apply Silvercel 4 1/4x 4 1/4 (in/in) as instructed Secondary Dressing ABD Pad 5x9 (in/in) Discharge Instruction: Cover with ABD pad Secured With Compression Wrap Profore Lite LF 3 Multilayer Compression Bandaging System Discharge Instruction: Apply 3 multi-layer wrap as prescribed. Compression Stockings Add-Ons Electronic Signature(s) Signed: 10/07/2020 2:11:15 PM By: Carlene Coria RN Entered By: Carlene Coria on 10/07/2020 11:19:31 Taliaferro, [name] ([id number]) -------------------------------------------------------------------------------- Wound Assessment Details Patient Name: [name] Date of Service: 10/07/2020 11:00 AM Medical Record Number: [id number] Patient Account Number: [id number] Date of Birth/Sex: [birth year] (55 y.o. F) Treating RN: Carlene Coria Primary Care Rebekkah Powless: Laurance Flatten Other Clinician: Referring Audi Wettstein: Laurance Flatten Treating Olayinka Gathers/Extender: Jeri Cos Weeks in Treatment: 1 Wound Status Wound Number: 8 Primary Diabetic Wound/Ulcer of the Lower Extremity Etiology: Wound Location: Left, Posterior Lower Leg Wound Open Wounding Event: Gradually Appeared Status: Date Acquired: 09/20/2020 Comorbid Congestive Heart Failure, Coronary Artery Disease, Weeks Of Treatment: 1 History: Hypertension, Myocardial Infarction, Peripheral Venous Clustered Wound: No Disease, Type II Diabetes, End Stage Renal Disease, Neuropathy Photos Wound Measurements Length: (cm) 2 Width: (cm) 4 Depth: (cm) 0.1 Area: (cm) 6.283 Volume: (cm) 0.628 % Reduction in Area: -113.3% % Reduction in Volume: -112.9% Epithelialization: None Tunneling: No Undermining: No Wound Description Classification: Grade  2 Exudate Amount: Medium Exudate Type: Serosanguineous Exudate Color: red, brown Foul Odor After Cleansing: No Slough/Fibrino Yes Wound Bed Granulation Amount: Small (1-33%) Exposed Structure Granulation Quality: Pink Fascia Exposed: No Necrotic Amount: Large (67-100%) Fat Layer (Subcutaneous Tissue) Exposed: Yes Necrotic Quality: Adherent Slough Tendon Exposed: No Muscle Exposed: No Joint Exposed: No Bone Exposed: No Treatment Notes Wound #8 (Lower Leg) Wound Laterality: Left, Posterior Cleanser Soap and Water Discharge Instruction: Gently cleanse wound with antibacterial soap, rinse and pat dry prior to dressing wounds Peri-Wound Care Holtzer, [name] ([id number]) Topical Primary Dressing Silvercel 4 1/4x 4 1/4 (in/in) Discharge Instruction: Apply Silvercel  4 1/4x 4 1/4 (in/in) as instructed Secondary Dressing ABD Pad 5x9 (in/in) Discharge Instruction: Cover with ABD pad Secured With Compression Wrap Profore Lite LF 3 Multilayer Compression [place] Discharge Instruction: Apply 3 multi-layer wrap as prescribed. Compression Stockings Add-Ons Electronic Signature(s) Signed: 10/07/2020 2:11:15 PM By: Carlene Coria RN Entered By: Carlene Coria on 10/07/2020 11:20:08 Merritts, [name] ([id number]) -------------------------------------------------------------------------------- Vitals Details Patient Name: [name] Date of Service: 10/07/2020 11:00 AM Medical Record Number: [id number] Patient Account Number: [id number] Date of Birth/Sex: 02-05-1966 (55 y.o. F) Treating RN: Carlene Coria Primary Care Saidi Santacroce: Laurance Flatten Other Clinician: Referring Taunja Brickner: Laurance Flatten Treating Temprance Wyre/Extender: Jeri Cos Weeks in Treatment: 1 Vital Signs Time Taken: 11:03 Temperature (F): 98.6 Pulse (bpm): 80 Respiratory Rate (breaths/min): 18 Blood Pressure (mmHg): 127/59 Reference Range: 80 - 120 mg / dl Electronic Signature(s) Signed: 10/07/2020 2:11:15 PM By: Carlene Coria RN Entered By: Carlene Coria on 10/07/2020 11:04:19

## 2020-10-07 NOTE — Progress Notes (Addendum)
KAIESHA, TONNER ([id number]) Visit Report for 10/07/2020 Chief Complaint Document Details Patient Name: SAILOR, HEVIA Date of Service: 10/07/2020 11:00 AM Medical Record Number: [id number] Patient Account Number: [id number] Date of Birth/Sex: 04/25/1965 (55 y.o. F) Treating RN: Carlene Coria Primary Care Provider: Laurance Flatten Other Clinician: Referring Provider: Laurance Flatten Treating Provider/Extender: Skipper Cliche in Treatment: 1 Information Obtained from: Patient Chief Complaint Left LE Ulcer Electronic Signature(s) Signed: 10/07/2020 11:27:15 AM By: Worthy Keeler PA-C Entered By: Worthy Keeler on 10/07/2020 11:27:14 Giroux, [name] ([id number]) -------------------------------------------------------------------------------- HPI Details Patient Name: [name] Date of Service: 10/07/2020 11:00 AM Medical Record Number: [id number] Patient Account Number: [id number] Date of Birth/Sex: [birth year] (55 y.o. F) Treating RN: Carlene Coria Primary Care Provider: Laurance Flatten Other Clinician: Referring Provider: Laurance Flatten Treating Provider/Extender: Skipper Cliche in Treatment: 1 History of Present Illness HPI Description: 10/24/2018 on evaluation today patient presents for initial evaluation or clinic concerning issues that she has been having with lymphedema for quite some time. Unfortunately she has several wound openings at this point that secondary to her lymphedema/venous stasis are giving her trouble and leaking quite severely. She also has diabetes along with hypertension and stage III chronic kidney disease. Fortunately there is no signs of active infection at this time. She is going to likely require debridement of the left leg ulcer upon evaluation today just based on what I am seeing. Fortunately there is no wound opening on the right. Overall the patient seems to be doing quite well and again there is no evidence of systemic infection which is good news. No fevers,  chills, nausea, vomiting, or diarrhea. 10/31/2018 on evaluation today patient actually appears to be doing excellent in regard to her lower extremity ulcers. She has been tolerating the dressing changes without complication. Fortunately there is no signs of active infection at this time. She has tolerated 3 layer compression wrap without complication. 11/07/2018 upon evaluation today patient actually appears to be doing very well with regard to her left lower extremity ulcers. She has been tolerating the dressing changes without complication. Fortunately there is no signs of active infection. No fevers, chills, nausea, vomiting, or diarrhea. 11/21/2018 upon evaluation today patient appears to be doing quite well with regard to her lower extremity ulcers. In fact both areas seem to be showing signs of good improvement which is excellent. She is not having as much pain as she has in the past and again has a lot of healing compared to previous visits as well. 12/05/2018 on evaluation today patient presents for follow-up concerning her ongoing issues with her bilateral lower extremity ulcers. The good news is her right lower extremity is showing signs of completely healing at this time which is great news. Fortunately there is no evidence of active infection. On the left she has just a very small area still remaining that is open at this time all in all she is very close to complete closure in my opinion. She does have compression stockings to wear at home. 12/12/2018 on evaluation today patient appears to be doing excellent in regard to her left lateral lower extremity ulcer. She has been tolerating the dressing changes without complication. Fortunately there is no signs of active infection at this time. In fact this appears to be pretty much healed at this point although again she is not 100% today. No fevers, chills, nausea, vomiting, or diarrhea. 12/19/2018 on evaluation today patient actually appears to  be doing excellent with regard to her lower extremity ulcer  in fact this appears to be completely healed today which is all some. She has done extremely well with wound care measures. No fevers, chills, nausea, vomiting, or diarrhea. ------------------------------------ 11/01/19-Readmission to the clinic Patient presents with left leg pain, wounds posterior calf, onset about 4 weeks, denies any fevers chills or shakes, has not been using anything to these wounds, was given compression stockings at last discharge from the clinic but has not been able to use them as they rolled down and cause creases Patient's history significant for type 2 diabetes insulin requiring, A1c of 6.9 lately, hypertension, chronic pain 8/18; patient readmitted that the clinic last week. She has wounds on her left lateral posterior lower leg all of this in close juxtaposition i.e. a localized site. We've been using silver alginate under 3 layer compression apparently the wound surface area is better. She was wearing compression stockings from elastic therapy but says they were falling down. As she progresses more towards healing will need to address what we use in terms of compression stockings perhaps external compression garments 11/17/2019 on evaluation today patient appears to be doing well with regard to her legs currently. She does have 2 wounds which are actually measuring much better the more posterior is actually doing very well which I am pleased with the other though smaller is not quite a small but nonetheless on the lateral aspect does seem to be improving. 11/23/2019 on evaluation today patient appears to be doing well in regard to her wounds. In fact the wound on the posterior aspect of her leg appears healed the lateral aspect is still open but extremely small. 9/15; this is a patient with chronic venous insufficiency and secondary lymphedema. She did not keep her clinic appointment last week and hence the left  leg is a lot more swollen since she had to take the wrap off at some point. She has a small weeping area on the left lateral lower leg. She has probably a juxta lite stocking for the left leg I have asked her to bring that in when she comes to her clinic appointment next week at which time she should be healed 10/6; this is a patient with chronic venous insufficiency and secondary lymphedema left greater than right. Skin changes of chronic lymphedema especially in the left leg. She has not been here in 3 weeks. She took the wrap off 2 weeks ago. She has some very old 20/30 stockings from elastic therapy in [place] she has been using. Fortunately her leg is closed. She also has a single Farrow wrap for the left leg Readmission: 06/18/2020 upon evaluation today patient presents for reevaluation here in the clinic. She is a long-term patient that we have seen intermittently when she has had flareups of issues with her lower extremity secondary to chronic venous stasis. With that being said unfortunately today she is having a significant issue with a wound which is actually quite significant over the right lower extremity. She did see dermatology they put Vaseline followed by Telfa island dressings on her unfortunately she is continuing to have significant issues however here with pain and in fact this wrap was extremely stuck to her upon evaluation today. Nonetheless I believe that she is going to require a little bit different approach to try to keep things from sticking and hopefully aid in getting this to heal more effectively and quickly. With that being said she does have a history of Monda, Dalonda ([id number]) chronic venous insufficiency, lymphedema, diabetes mellitus type 2,  chronic kidney disease stage III, hypertension, and congestive heart failure. This started as an injury in mid February and has worsened over the past 2 weeks. 07/04/2020 upon evaluation today patient appears to be doing  well with regard to her wound on the leg. Overall I am extremely pleased with where things stand and I do think that she is making excellent progress. There is no sign of infection right now which is also great news. I think that she is close to complete resolution. 07/09/20 upon evaluation today patient appears to be doing excellent in regard to her leg ulcer. She has been tolerating the dressing changes without complication and in general I am extremely pleased with where things stand today. There does not appear to be any signs of active infection at this time which is great news. 07/25/2020 upon evaluation today patient appears to be doing well with regard to her wound. She has been tolerating the dressing changes without complication. Fortunately there is no signs of active infection at this time and her wound appears to be completely healed. Readmission: 09/30/2020 upon evaluation today patient appears for reevaluation here in our clinic. She was discharged May 5 that unfortunately has started to have weeping and wounds on the left leg at this point. She tells me the right leg did weep some but nothing as significant as the left. With all that being said at this point the patient states that she figured it was best to come in here and she was having a lot of drainage and it was get all of her sheets and everything. She has been wearing her Velcro wraps sometimes but it does not sound like she is been wearing them all the time which I think is part of the reason why this reopened. She also unfortunately had a fall which was quite significant. This occurred I believe about a week or 2 ago. Otherwise patient's past medical history really has not changed significantly. 10/07/2020 upon evaluation today patient appears to be doing better in regard to her wounds of the lower extremities. Fortunately there does not appear to be any signs of active infection at this time which is great news. I do not see any  evidence of anything worsening but again she continues to have significant bilateral stage III lymphedema. This has been an ongoing issue. We actually have seen her this year pretty much from March through May of 2022. Following this she continued with compression at home following but then unfortunately reopened again. That was on July 11. With that being said this is an ongoing and recurrent issue for which honestly have seen her over the past 2 years pretty much consistently. Nonetheless we have had Velcro compression wraps that she uses at home and despite this she continues to have frequent and recurrent ulcerations. Electronic Signature(s) Signed: 10/07/2020 1:24:50 PM By: Worthy Keeler PA-C Entered By: Worthy Keeler on 10/07/2020 13:24:50 Kluesner, [name] ([id number]) -------------------------------------------------------------------------------- Physical Exam Details Patient Name: [name] Date of Service: 10/07/2020 11:00 AM Medical Record Number: [id number] Patient Account Number: [id number] Date of Birth/Sex: January 17, 1966 (55 y.o. F) Treating RN: Carlene Coria Primary Care Provider: Laurance Flatten Other Clinician: Referring Provider: Laurance Flatten Treating Provider/Extender: Jeri Cos Weeks in Treatment: 1 Constitutional Obese and well-hydrated in no acute distress. Respiratory normal breathing without difficulty. Psychiatric this patient is able to make decisions and demonstrates good insight into disease process. Alert and Oriented x 3. pleasant and cooperative. Notes Upon inspection patient's wounds again on  the right appear to be healed on the left she has wounds that are improving but still nonetheless open up. Again this is a reopening from where she was actually healed out on in May. Following that she did initiate treatment with the Velcro compression wraps and compression socks. Unfortunately this just has not kept things under control she reopen again in July. With  that being said she does tell me that she tries to elevate her legs is much as she can and tries to walk and exercise is much as she can. Nonetheless she persists with stage III lymphedema. And again this has been well beyond the month or so necessary for treatment before the potential for lymphedema pumps can be considered. I think for this reason I would recommend going ahead and proceeding with the lymphedema pumps at this point. Electronic Signature(s) Signed: 10/07/2020 1:25:50 PM By: Worthy Keeler PA-C Entered By: Worthy Keeler on 10/07/2020 13:25:49 Cogburn, [name] ([id number]) -------------------------------------------------------------------------------- Physician Orders Details Patient Name: [name] Date of Service: 10/07/2020 11:00 AM Medical Record Number: [id number] Patient Account Number: [id number] Date of Birth/Sex: January 14, 1966 (55 y.o. F) Treating RN: Carlene Coria Primary Care Provider: Laurance Flatten Other Clinician: Referring Provider: Laurance Flatten Treating Provider/Extender: Skipper Cliche in Treatment: 1 Verbal / Phone Orders: No Diagnosis Coding ICD-10 Coding Code Description I87.2 Venous insufficiency (chronic) (peripheral) I89.0 Lymphedema, not elsewhere classified L97.822 Non-pressure chronic ulcer of other part of left lower leg with fat layer exposed E11.622 Type 2 diabetes mellitus with other skin ulcer N18.30 Chronic kidney disease, stage 3 unspecified I10 Essential (primary) hypertension I50.42 Chronic combined systolic (congestive) and diastolic (congestive) heart failure Follow-up Appointments o Return Appointment in 1 week. Bathing/ Shower/ Hygiene o May shower with wound dressing protected with water repellent cover or cast protector. Edema Control - Lymphedema / Segmental Compressive Device / Other o 3 Layer Compression System for Lymphedema. - right lower leg o Elevate, Exercise Daily and Avoid Standing for Long Periods of  Time. o Elevate legs to the level of the heart and pump ankles as often as possible o Elevate leg(s) parallel to the floor when sitting. o Compression Pump: Use compression pump on left lower extremity for 60 minutes, twice daily. o Compression Pump: Use compression pump on right lower extremity for 60 minutes, twice daily. Wound Treatment Wound #7 - Lower Leg Wound Laterality: Left, Anterior Cleanser: Soap and Water 1 x Per Week/30 Days Discharge Instructions: Gently cleanse wound with antibacterial soap, rinse and pat dry prior to dressing wounds Primary Dressing: Silvercel 4 1/4x 4 1/4 (in/in) 1 x Per Week/30 Days Discharge Instructions: Apply Silvercel 4 1/4x 4 1/4 (in/in) as instructed Secondary Dressing: ABD Pad 5x9 (in/in) 1 x Per Week/30 Days Discharge Instructions: Cover with ABD pad Compression Wrap: Profore Lite LF 3 Multilayer Compression Bandaging System 1 x Per Week/30 Days Discharge Instructions: Apply 3 multi-layer wrap as prescribed. Wound #8 - Lower Leg Wound Laterality: Left, Posterior Cleanser: Soap and Water 1 x Per Week/30 Days Discharge Instructions: Gently cleanse wound with antibacterial soap, rinse and pat dry prior to dressing wounds Primary Dressing: Silvercel 4 1/4x 4 1/4 (in/in) 1 x Per Week/30 Days Discharge Instructions: Apply Silvercel 4 1/4x 4 1/4 (in/in) as instructed Secondary Dressing: ABD Pad 5x9 (in/in) 1 x Per Week/30 Days Discharge Instructions: Cover with ABD pad Compression Wrap: Profore Lite LF 3 Multilayer Compression Bandaging System 1 x Per Week/30 Days Discharge Instructions: Apply 3 multi-layer wrap as prescribed. Compression Stockings: Circaid Juxta  Lite Compression Wrap (DME) Goody, Lether ([id number]) Left Leg Compression Amount: 30-40 mmHG Discharge Instructions: Apply Circaid Juxta Lite Compression Wrap as directed Services and Therapies o Intermittent pneumatic compression pump - (ICD10 I89.0 - Lymphedema, not elsewhere  classified) Electronic Signature(s) Signed: 10/07/2020 2:11:15 PM By: Carlene Coria RN Signed: 10/07/2020 5:02:09 PM By: Worthy Keeler PA-C Entered By: Carlene Coria on 10/07/2020 12:00:28 Urbanowicz, [name] ([id number]) -------------------------------------------------------------------------------- Problem List Details Patient Name: [name] Date of Service: 10/07/2020 11:00 AM Medical Record Number: [id number] Patient Account Number: [id number] Date of Birth/Sex: 1965/08/17 (55 y.o. F) Treating RN: Carlene Coria Primary Care Provider: Laurance Flatten Other Clinician: Referring Provider: Laurance Flatten Treating Provider/Extender: Skipper Cliche in Treatment: 1 Active Problems ICD-10 Encounter Code Description Active Date MDM Diagnosis I87.2 Venous insufficiency (chronic) (peripheral) 09/30/2020 No Yes I89.0 Lymphedema, not elsewhere classified 09/30/2020 No Yes L97.822 Non-pressure chronic ulcer of other part of left lower leg with fat layer 09/30/2020 No Yes exposed E11.622 Type 2 diabetes mellitus with other skin ulcer 09/30/2020 No Yes N18.30 Chronic kidney disease, stage 3 unspecified 09/30/2020 No Yes I10 Essential (primary) hypertension 09/30/2020 No Yes I50.42 Chronic combined systolic (congestive) and diastolic (congestive) heart 09/30/2020 No Yes failure Inactive Problems Resolved Problems Electronic Signature(s) Signed: 10/07/2020 11:20:26 AM By: Worthy Keeler PA-C Entered By: Worthy Keeler on 10/07/2020 11:20:26 Carnathan, Cariann ([id number]) -------------------------------------------------------------------------------- Progress Note Details Patient Name: [name] Date of Service: 10/07/2020 11:00 AM Medical Record Number: [id number] Patient Account Number: [id number] Date of Birth/Sex: [birth year] (55 y.o. F) Treating RN: Carlene Coria Primary Care Provider: Laurance Flatten Other Clinician: Referring Provider: Laurance Flatten Treating Provider/Extender: Skipper Cliche in  Treatment: 1 Subjective Chief Complaint Information obtained from Patient Left LE Ulcer History of Present Illness (HPI) 10/24/2018 on evaluation today patient presents for initial evaluation or clinic concerning issues that she has been having with lymphedema for quite some time. Unfortunately she has several wound openings at this point that secondary to her lymphedema/venous stasis are giving her trouble and leaking quite severely. She also has diabetes along with hypertension and stage III chronic kidney disease. Fortunately there is no signs of active infection at this time. She is going to likely require debridement of the left leg ulcer upon evaluation today just based on what I am seeing. Fortunately there is no wound opening on the right. Overall the patient seems to be doing quite well and again there is no evidence of systemic infection which is good news. No fevers, chills, nausea, vomiting, or diarrhea. 10/31/2018 on evaluation today patient actually appears to be doing excellent in regard to her lower extremity ulcers. She has been tolerating the dressing changes without complication. Fortunately there is no signs of active infection at this time. She has tolerated 3 layer compression wrap without complication. 11/07/2018 upon evaluation today patient actually appears to be doing very well with regard to her left lower extremity ulcers. She has been tolerating the dressing changes without complication. Fortunately there is no signs of active infection. No fevers, chills, nausea, vomiting, or diarrhea. 11/21/2018 upon evaluation today patient appears to be doing quite well with regard to her lower extremity ulcers. In fact both areas seem to be showing signs of good improvement which is excellent. She is not having as much pain as she has in the past and again has a lot of healing compared to previous visits as well. 12/05/2018 on evaluation today patient presents for follow-up concerning  her ongoing issues with her bilateral lower extremity  ulcers. The good news is her right lower extremity is showing signs of completely healing at this time which is great news. Fortunately there is no evidence of active infection. On the left she has just a very small area still remaining that is open at this time all in all she is very close to complete closure in my opinion. She does have compression stockings to wear at home. 12/12/2018 on evaluation today patient appears to be doing excellent in regard to her left lateral lower extremity ulcer. She has been tolerating the dressing changes without complication. Fortunately there is no signs of active infection at this time. In fact this appears to be pretty much healed at this point although again she is not 100% today. No fevers, chills, nausea, vomiting, or diarrhea. 12/19/2018 on evaluation today patient actually appears to be doing excellent with regard to her lower extremity ulcer in fact this appears to be completely healed today which is all some. She has done extremely well with wound care measures. No fevers, chills, nausea, vomiting, or diarrhea. ------------------------------------ 11/01/19-Readmission to the clinic Patient presents with left leg pain, wounds posterior calf, onset about 4 weeks, denies any fevers chills or shakes, has not been using anything to these wounds, was given compression stockings at last discharge from the clinic but has not been able to use them as they rolled down and cause creases Patient's history significant for type 2 diabetes insulin requiring, A1c of 6.9 lately, hypertension, chronic pain 8/18; patient readmitted that the clinic last week. She has wounds on her left lateral posterior lower leg all of this in close juxtaposition i.e. a localized site. We've been using silver alginate under 3 layer compression apparently the wound surface area is better. She was wearing compression stockings from elastic  therapy but says they were falling down. As she progresses more towards healing will need to address what we use in terms of compression stockings perhaps external compression garments 11/17/2019 on evaluation today patient appears to be doing well with regard to her legs currently. She does have 2 wounds which are actually measuring much better the more posterior is actually doing very well which I am pleased with the other though smaller is not quite a small but nonetheless on the lateral aspect does seem to be improving. 11/23/2019 on evaluation today patient appears to be doing well in regard to her wounds. In fact the wound on the posterior aspect of her leg appears healed the lateral aspect is still open but extremely small. 9/15; this is a patient with chronic venous insufficiency and secondary lymphedema. She did not keep her clinic appointment last week and hence the left leg is a lot more swollen since she had to take the wrap off at some point. She has a small weeping area on the left lateral lower leg. She has probably a juxta lite stocking for the left leg I have asked her to bring that in when she comes to her clinic appointment next week at which time she should be healed 10/6; this is a patient with chronic venous insufficiency and secondary lymphedema left greater than right. Skin changes of chronic lymphedema especially in the left leg. She has not been here in 3 weeks. She took the wrap off 2 weeks ago. She has some very old 20/30 stockings from elastic therapy in [place] she has been using. Fortunately her leg is closed. She also has a single Farrow wrap for the left leg Readmission: 06/18/2020 upon evaluation  today patient presents for reevaluation here in the clinic. She is a long-term patient that we have seen intermittently Roen, Braxton ([id number]) when she has had flareups of issues with her lower extremity secondary to chronic venous stasis. With that being said  unfortunately today she is having a significant issue with a wound which is actually quite significant over the right lower extremity. She did see dermatology they put Vaseline followed by Telfa island dressings on her unfortunately she is continuing to have significant issues however here with pain and in fact this wrap was extremely stuck to her upon evaluation today. Nonetheless I believe that she is going to require a little bit different approach to try to keep things from sticking and hopefully aid in getting this to heal more effectively and quickly. With that being said she does have a history of chronic venous insufficiency, lymphedema, diabetes mellitus type 2, chronic kidney disease stage III, hypertension, and congestive heart failure. This started as an injury in mid February and has worsened over the past 2 weeks. 07/04/2020 upon evaluation today patient appears to be doing well with regard to her wound on the leg. Overall I am extremely pleased with where things stand and I do think that she is making excellent progress. There is no sign of infection right now which is also great news. I think that she is close to complete resolution. 07/09/20 upon evaluation today patient appears to be doing excellent in regard to her leg ulcer. She has been tolerating the dressing changes without complication and in general I am extremely pleased with where things stand today. There does not appear to be any signs of active infection at this time which is great news. 07/25/2020 upon evaluation today patient appears to be doing well with regard to her wound. She has been tolerating the dressing changes without complication. Fortunately there is no signs of active infection at this time and her wound appears to be completely healed. Readmission: 09/30/2020 upon evaluation today patient appears for reevaluation here in our clinic. She was discharged May 5 that unfortunately has started to have weeping and  wounds on the left leg at this point. She tells me the right leg did weep some but nothing as significant as the left. With all that being said at this point the patient states that she figured it was best to come in here and she was having a lot of drainage and it was get all of her sheets and everything. She has been wearing her Velcro wraps sometimes but it does not sound like she is been wearing them all the time which I think is part of the reason why this reopened. She also unfortunately had a fall which was quite significant. This occurred I believe about a week or 2 ago. Otherwise patient's past medical history really has not changed significantly. 10/07/2020 upon evaluation today patient appears to be doing better in regard to her wounds of the lower extremities. Fortunately there does not appear to be any signs of active infection at this time which is great news. I do not see any evidence of anything worsening but again she continues to have significant bilateral stage III lymphedema. This has been an ongoing issue. We actually have seen her this year pretty much from March through May of 2022. Following this she continued with compression at home following but then unfortunately reopened again. That was on July 11. With that being said this is an ongoing and recurrent issue for  which honestly have seen her over the past 2 years pretty much consistently. Nonetheless we have had Velcro compression wraps that she uses at home and despite this she continues to have frequent and recurrent ulcerations. Objective Constitutional Obese and well-hydrated in no acute distress. Vitals Time Taken: 11:03 AM, Temperature: 98.6 F, Pulse: 80 bpm, Respiratory Rate: 18 breaths/min, Blood Pressure: 127/59 mmHg. Respiratory normal breathing without difficulty. Psychiatric this patient is able to make decisions and demonstrates good insight into disease process. Alert and Oriented x 3. pleasant and  cooperative. General Notes: Upon inspection patient's wounds again on the right appear to be healed on the left she has wounds that are improving but still nonetheless open up. Again this is a reopening from where she was actually healed out on in May. Following that she did initiate treatment with the Velcro compression wraps and compression socks. Unfortunately this just has not kept things under control she reopen again in July. With that being said she does tell me that she tries to elevate her legs is much as she can and tries to walk and exercise is much as she can. Nonetheless she persists with stage III lymphedema. And again this has been well beyond the month or so necessary for treatment before the potential for lymphedema pumps can be considered. I think for this reason I would recommend going ahead and proceeding with the lymphedema pumps at this point. Integumentary (Hair, Skin) Wound #7 status is Open. Original cause of wound was Gradually Appeared. The date acquired was: 09/20/2020. The wound has been in treatment 1 weeks. The wound is located on the Left,Anterior Lower Leg. The wound measures 10cm length x 5.5cm width x 1cm depth; 43.197cm^2 area and 43.197cm^3 volume. There is Fat Layer (Subcutaneous Tissue) exposed. There is no tunneling or undermining noted. There is a medium amount of serosanguineous drainage noted. There is medium (34-66%) granulation within the wound bed. There is a medium (34-66%) amount of necrotic tissue within the wound bed including Adherent Slough. Wound #8 status is Open. Original cause of wound was Gradually Appeared. The date acquired was: 09/20/2020. The wound has been in treatment 1 weeks. The wound is located on the Left,Posterior Lower Leg. The wound measures 2cm length x 4cm width x 0.1cm depth; 6.283cm^2 area and 0.628cm^3 volume. There is Fat Layer (Subcutaneous Tissue) exposed. There is no tunneling or undermining noted. There is a medium amount of  serosanguineous drainage noted. There is small (1-33%) pink granulation within the wound bed. There is a large (67-100%) amount of Silguero, Cathe ([id number]) necrotic tissue within the wound bed including Adherent Slough. 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 There was a Three Layer Compression Therapy Procedure by Carlene Coria, RN. Post procedure Diagnosis Wound #7: Same as Pre-Procedure Wound #8 Pre-procedure diagnosis of Wound #8 is a Diabetic Wound/Ulcer of the Lower Extremity located on the Left,Posterior Lower Leg . There was a Three Layer Compression Therapy Procedure by Carlene Coria, RN. Post procedure Diagnosis Wound #8: Same as Pre-Procedure Plan Follow-up Appointments: Return Appointment in 1 week. Bathing/ Shower/ Hygiene: May shower with wound  dressing protected with water repellent cover or cast protector. Edema Control - Lymphedema / Segmental Compressive Device / Other: 3 Layer Compression System for Lymphedema. - right lower leg Elevate, Exercise Daily and Avoid Standing for Long Periods of Time. Elevate legs to the level of the heart and pump ankles as often as possible Elevate leg(s) parallel to the floor when sitting. Compression Pump: Use compression pump on left lower extremity for 60 minutes, twice daily. Compression Pump: Use compression pump on right lower extremity for 60 minutes, twice daily. Services and Therapies ordered were: Intermittent pneumatic  compression pump WOUND #7: - Lower Leg Wound Laterality: Left, Anterior Cleanser: Soap and Water 1 x Per Week/30 Days Discharge Instructions: Gently cleanse wound with antibacterial soap, rinse and pat dry prior to dressing wounds Primary Dressing: Silvercel 4 1/4x 4 1/4 (in/in) 1 x Per Week/30 Days Discharge Instructions: Apply Silvercel 4 1/4x 4 1/4 (in/in) as instructed Secondary Dressing: ABD Pad 5x9 (in/in) 1 x Per Week/30 Days Discharge Instructions: Cover with ABD pad Compression Wrap: Profore Lite LF 3 Multilayer Compression Bandaging System 1 x Per Week/30 Days Discharge Instructions: Apply 3 multi-layer wrap as prescribed. WOUND #8: - Lower Leg Wound Laterality: Left, Posterior Cleanser: Soap and Water 1 x Per Week/30 Days Discharge Instructions: Gently cleanse wound with antibacterial soap, rinse and pat dry prior to dressing wounds Primary Dressing: Silvercel 4 1/4x 4 1/4 (in/in) 1 x Per Week/30 Days Discharge Instructions: Apply Silvercel 4 1/4x 4 1/4 (in/in) as instructed Secondary Dressing: ABD Pad 5x9 (in/in) 1 x Per Week/30 Days Discharge Instructions: Cover with ABD pad Compression Wrap: Profore Lite LF 3 Multilayer Compression Bandaging System 1 x Per Week/30 Days Discharge Instructions: Apply 3 multi-layer wrap as prescribed. Compression Stockings: Circaid Juxta Lite Compression Wrap (DME) Krull, Harolyn ([id number]) Compression Amount: 30-40 mmHg (left) Discharge Instructions: Apply Circaid Juxta Lite Compression Wrap as directed 1. Again the patient does have stage III lymphedema with evidence of skin hardening and this is nonpitting at this point. 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 In regard to the limbs that she is actually bilaterally affected by this. She has been trying to elevate is much as possible and also tells me that she tries to walk around and exercise is much as she can. However with the issues  that she has going on and despite conservative treatment such as this exercise, elevation, and compression wraps of the Velcro form she still continues to have recurrent issues with wounds and weeping on the bilateral lower extremities. For this reason I do think lymphedema pumps would be appropriate for her. 2. I am also can recommend that we have the patient going continue with elevating her legs and try is much as possible to walk around as this will help pump and cycle blood through her legs as well. 3. I am also can recommend that we continue with the compression wrapping at this point and this will be placed again today again right now were doing bilateral 3 layer compression wraps. We will see patient back for reevaluation in 1 week here in the clinic. If anything worsens or changes patient will contact our office for additional recommendations. Electronic Signature(s) Signed: 10/07/2020 1:27:14 PM By: Worthy Keeler PA-C Entered By: Worthy Keeler on 10/07/2020 13:27:14 Hamelin, [name] ([id number]) -------------------------------------------------------------------------------- SuperBill Details Patient Name: [name] Date of Service: 10/07/2020 Medical Record Number: [id number] Patient Account Number: [id number] Date of  Birth/Sex: Jun 06, 1965 (55 y.o. F) Treating RN: Carlene Coria Primary Care Provider: Laurance Flatten Other Clinician: Referring Provider: Laurance Flatten Treating Provider/Extender: Jeri Cos Weeks in Treatment: 1 Diagnosis Coding ICD-10 Codes Code Description I87.2 Venous insufficiency (chronic) (peripheral) I89.0 Lymphedema, not elsewhere classified L97.822 Non-pressure chronic ulcer of other part of left lower leg with fat layer exposed E11.622 Type 2 diabetes mellitus with other skin ulcer N18.30 Chronic kidney disease, stage 3 unspecified I10 Essential (primary) hypertension I50.42 Chronic combined systolic (congestive) and diastolic (congestive) heart  failure Facility Procedures CPT4: Description Modifier Quantity Code [id number] BILATERAL: Application of multi-layer venous compression system; leg (below knee), including 1 ankle and foot. Physician Procedures CPT4 Code: 1146431 Description: 42767 - WC PHYS LEVEL 4 - EST PT Modifier: Quantity: 1 CPT4 Code: Description: ICD-10 Diagnosis Description I87.2 Venous insufficiency (chronic) (peripheral) I89.0 Lymphedema, not elsewhere classified L97.822 Non-pressure chronic ulcer of other part of left lower leg with fat lay E11.622 Type 2 diabetes mellitus with  other skin ulcer Modifier: er exposed Quantity: Electronic Signature(s) Signed: 10/07/2020 1:27:27 PM By: Worthy Keeler PA-C Entered By: Worthy Keeler on 10/07/2020 13:27:26

## 2020-10-09 ENCOUNTER — Other Ambulatory Visit (HOSPITAL_COMMUNITY): Payer: Self-pay

## 2020-10-09 ENCOUNTER — Other Ambulatory Visit: Payer: Self-pay

## 2020-10-09 ENCOUNTER — Encounter (HOSPITAL_COMMUNITY): Payer: Self-pay | Admitting: Cardiology

## 2020-10-09 ENCOUNTER — Ambulatory Visit (HOSPITAL_COMMUNITY)
Admission: RE | Admit: 2020-10-09 | Discharge: 2020-10-09 | Disposition: A | Discharge status: Home / Self Care | Payer: Medicare Other | Source: Ambulatory Visit | Attending: Cardiology | Admitting: Cardiology

## 2020-10-09 VITALS — BP 104/60 | HR 81 | Wt 334.0 lb

## 2020-10-09 DIAGNOSIS — I5022 Chronic systolic (congestive) heart failure: Secondary | ICD-10-CM | POA: Insufficient documentation

## 2020-10-09 DIAGNOSIS — I452 Bifascicular block: Secondary | ICD-10-CM | POA: Diagnosis not present

## 2020-10-09 LAB — BASIC METABOLIC PANEL
Anion gap: 10 (ref 5–15)
BUN: 44 mg/dL — ABNORMAL HIGH (ref 6–20)
CO2: 28 mmol/L (ref 22–32)
Calcium: 9.1 mg/dL (ref 8.9–10.3)
Chloride: 103 mmol/L (ref 98–111)
Creatinine, Ser: 2.27 mg/dL — ABNORMAL HIGH (ref 0.44–1.00)
GFR, Estimated: 25 mL/min — ABNORMAL LOW (ref >60)
Glucose, Bld: 128 mg/dL — ABNORMAL HIGH (ref 70–99)
Potassium: 3.4 mmol/L — ABNORMAL LOW (ref 3.5–5.1)
Sodium: 141 mmol/L (ref 135–145)

## 2020-10-09 LAB — BRAIN NATRIURETIC PEPTIDE: B Natriuretic Peptide: 190.2 pg/mL — ABNORMAL HIGH (ref 0.0–100.0)

## 2020-10-09 MED ORDER — TORSEMIDE 20 MG PO TABS: 60.0000 mg | ORAL_TABLET | Freq: Two times a day (BID) | ORAL | 3 refills | Status: DC

## 2020-10-09 NOTE — Progress Notes (Signed)
ReDS Vest / Clip - 10/09/20 1200       ReDS Vest / Clip   Station Marker D    Ruler Value 41    ReDS Value Range Moderate volume overload    ReDS Actual Value 38

## 2020-10-09 NOTE — Patient Instructions (Addendum)
EKG done today.  RedsClip done today.   Labs done today. We will contact you only if your labs are abnormal.  STOP taking Amlodipine  INCREASE Torsemide to 60mg  (3 tablets) by mouth 2 times daily.   We will be in contact with you regarding starting Semaglutide   No other medication changes were made. Please continue all current medications as prescribed.  Your physician recommends that you schedule a follow-up appointment in: 10 days for a lab only appointment at your primary care physician  (order provided to you today during office visit) and in 1 month with Dr. Aundra Dubin  If you have any questions or concerns before your next appointment please send [place] a message through [facility] or call our office at [phone].    TO LEAVE A MESSAGE FOR THE NURSE SELECT OPTION 2, PLEASE LEAVE A MESSAGE INCLUDING: YOUR NAME DATE OF BIRTH CALL BACK NUMBER REASON FOR CALL**this is important as we prioritize the call backs  YOU WILL RECEIVE A CALL BACK THE SAME DAY AS LONG AS YOU CALL BEFORE 4:00 PM   Do the following things EVERYDAY: Weigh yourself in the morning before breakfast. Write it down and keep it in a log. Take your medicines as prescribed Eat low salt foods--Limit salt (sodium) to 2000 mg per day.  Stay as active as you can everyday Limit all fluids for the day to less than 2 liters   At the [facility], you and your health needs are our priority. As part of our continuing mission to provide you with exceptional heart care, we have created designated Provider Care Teams. These Care Teams include your primary Cardiologist (physician) and Advanced Practice Providers (APPs- Physician Assistants and Nurse Practitioners) who all work together to provide you with the care you need, when you need it.   You may see any of the following providers on your designated Care Team at your next follow up: Dr Glori Bickers Dr Haynes Kerns, NP Lyda Jester,  [place] Audry Riles, PharmD   Please be sure to bring in all your medications bottles to every appointment.

## 2020-10-10 ENCOUNTER — Other Ambulatory Visit: Payer: Self-pay

## 2020-10-10 DIAGNOSIS — E11622 Type 2 diabetes mellitus with other skin ulcer: Secondary | ICD-10-CM | POA: Diagnosis not present

## 2020-10-10 NOTE — Progress Notes (Signed)
[name], [name] ([id number]) Visit Report for 10/10/2020 Arrival Information Details Patient Name: [name], [name] Date of Service: 10/10/2020 8:30 AM Medical Record Number: [id number] Patient Account Number: [id number] Date of Birth/Sex: 1965/04/17 (55 y.o. F) Treating RN: Donnamarie Poag Primary Care Lanett Lasorsa: Laurance Flatten Other Clinician: Referring Ercole Georg: Laurance Flatten Treating Chaze Hruska/Extender: Skipper Cliche in Treatment: 1 Visit Information History Since Last Visit Added or deleted any medications: No Patient Arrived: Ambulatory Had a fall or experienced change in No Arrival Time: 08:33 activities of daily living that may affect Accompanied By: self risk of falls: Transfer Assistance: None Hospitalized since last visit: No Patient Identification Verified: Yes Has Dressing in Place as Prescribed: Yes Secondary Verification Process Completed: Yes Has Compression in Place as Prescribed: Yes Patient Requires Transmission-Based Precautions: No Pain Present Now: Yes Patient Has Alerts: No Electronic Signature(s) Signed: 10/10/2020 10:03:03 AM By: Donnamarie Poag Entered By: Donnamarie Poag on 10/10/2020 08:34:42 Mcgue, Bryona ([id number]) -------------------------------------------------------------------------------- Clinic Level of Care Assessment Details Patient Name: [name] Date of Service: 10/10/2020 8:30 AM Medical Record Number: [id number] Patient Account Number: [id number] Date of Birth/Sex: 26-Aug-1965 (55 y.o. F) Treating RN: Donnamarie Poag Primary Care Julionna Marczak: Laurance Flatten Other Clinician: Referring Jenaye Rickert: Laurance Flatten Treating Sheilyn Boehlke/Extender: Skipper Cliche in Treatment: 1 Clinic Level of Care Assessment Items TOOL 1 Quantity Score []  - Use when EandM and Procedure is performed on INITIAL visit 0 ASSESSMENTS - Nursing Assessment / Reassessment []  - General Physical Exam (combine w/ comprehensive assessment (listed just below) when performed on new 0 pt.  evals) []  - 0 Comprehensive Assessment (HX, ROS, Risk Assessments, Wounds Hx, etc.) ASSESSMENTS - Wound and Skin Assessment / Reassessment []  - Dermatologic / Skin Assessment (not related to wound area) 0 ASSESSMENTS - Ostomy and/or Continence Assessment and Care []  - Incontinence Assessment and Management 0 []  - 0 Ostomy Care Assessment and Management (repouching, etc.) PROCESS - Coordination of Care []  - Simple Patient / Family Education for ongoing care 0 []  - 0 Complex (extensive) Patient / Family Education for ongoing care []  - 0 Staff obtains Programmer, systems, Records, Test Results / Process Orders []  - 0 Staff telephones HHA, Nursing Homes / Clarify orders / etc []  - 0 Routine Transfer to another Facility (non-emergent condition) []  - 0 Routine Hospital Admission (non-emergent condition) []  - 0 New Admissions / Biomedical engineer / Ordering NPWT, Apligraf, etc. []  - 0 Emergency Hospital Admission (emergent condition) PROCESS - Special Needs []  - Pediatric / Minor Patient Management 0 []  - 0 Isolation Patient Management []  - 0 Hearing / Language / Visual special needs []  - 0 Assessment of Community assistance (transportation, D/C planning, etc.) []  - 0 Additional assistance / Altered mentation []  - 0 Support Surface(s) Assessment (bed, cushion, seat, etc.) INTERVENTIONS - Miscellaneous []  - External ear exam 0 []  - 0 Patient Transfer (multiple staff / Civil Service fast streamer / Similar devices) []  - 0 Simple Staple / Suture removal (25 or less) []  - 0 Complex Staple / Suture removal (26 or more) []  - 0 Hypo/Hyperglycemic Management (do not check if billed separately) []  - 0 Ankle / Brachial Index (ABI) - do not check if billed separately Has the patient been seen at the hospital within the last three years: Yes Total Score: 0 Level Of Care: ____ [name] ([id number]) Electronic Signature(s) Signed: 10/10/2020 10:03:03 AM By: Donnamarie Poag Entered By: Donnamarie Poag on  10/10/2020 09:00:51 Pennel, [name] ([id number]) -------------------------------------------------------------------------------- Compression Therapy Details Patient Name: [name] Date of Service: 10/10/2020 8:30 AM Medical Record  Number: [id number] Patient Account Number: [id number] Date of Birth/Sex: 1966-03-04 (55 y.o. F) Treating RN: Donnamarie Poag Primary Care Tavie Haseman: Laurance Flatten Other Clinician: Referring Merlie Noga: Laurance Flatten Treating Zaya Kessenich/Extender: Jeri Cos Weeks in Treatment: 1 Compression Therapy Performed for Wound Assessment: Wound #8 Left,Posterior Lower Leg Performed By: Clinician Donnamarie Poag, RN Compression Type: Three Layer Electronic Signature(s) Signed: 10/10/2020 10:03:03 AM By: Donnamarie Poag Entered By: Donnamarie Poag on 10/10/2020 08:58:56 Ostermiller, [name] ([id number]) -------------------------------------------------------------------------------- Compression Therapy Details Patient Name: [name] Date of Service: 10/10/2020 8:30 AM Medical Record Number: [id number] Patient Account Number: [id number] Date of Birth/Sex: April 10, 1965 (55 y.o. F) Treating RN: Donnamarie Poag Primary Care Ngan Qualls: Laurance Flatten Other Clinician: Referring Aimee Timmons: Laurance Flatten Treating Jezabelle Chisolm/Extender: Jeri Cos Weeks in Treatment: 1 Compression Therapy Performed for Wound Assessment: NonWound Condition Lymphedema - Right Leg Performed By: Clinician Donnamarie Poag, RN Compression Type: Three Layer Electronic Signature(s) Signed: 10/10/2020 10:03:03 AM By: Donnamarie Poag Entered By: Donnamarie Poag on 10/10/2020 08:59:10 Blaze, [name] ([id number]) -------------------------------------------------------------------------------- Encounter Discharge Information Details Patient Name: [name] Date of Service: 10/10/2020 8:30 AM Medical Record Number: [id number] Patient Account Number: [id number] Date of Birth/Sex: [birth year] (55 y.o. F) Treating RN: Donnamarie Poag Primary Care  Esdras Delair: Laurance Flatten Other Clinician: Referring Dalayah Deahl: Laurance Flatten Treating Decorey Wahlert/Extender: Skipper Cliche in Treatment: 1 Encounter Discharge Information Items Discharge Condition: Stable Ambulatory Status: Ambulatory Discharge Destination: Home Transportation: Private Auto Accompanied By: self Schedule Follow-up Appointment: Yes Clinical Summary of Care: Electronic Signature(s) Signed: 10/10/2020 10:03:03 AM By: Donnamarie Poag Entered By: Donnamarie Poag on 10/10/2020 09:00:05 Mcgue, [name] ([id number]) -------------------------------------------------------------------------------- Wound Assessment Details Patient Name: [name] Date of Service: 10/10/2020 8:30 AM Medical Record Number: [id number] Patient Account Number: [id number] Date of Birth/Sex: 09-21-1965 (55 y.o. F) Treating RN: Donnamarie Poag Primary Care Amedio Bowlby: Laurance Flatten Other Clinician: Referring [place] Doster: Laurance Flatten Treating Triva Hueber/Extender: Jeri Cos Weeks in Treatment: 1 Wound Status Wound Number: 7 Primary Diabetic Wound/Ulcer of the Lower Extremity Etiology: Wound Location: Left, Anterior Lower Leg Wound Open Wounding Event: Gradually Appeared Status: Date Acquired: 09/20/2020 Comorbid Congestive Heart Failure, Coronary Artery Disease, Weeks Of Treatment: 1 History: Hypertension, Myocardial Infarction, Peripheral Venous Clustered Wound: No Disease, Type II Diabetes, End Stage Renal Disease, Neuropathy Wound Measurements Length: (cm) 10 Width: (cm) 5.5 Depth: (cm) 1 Area: (cm) 43.197 Volume: (cm) 43.197 % Reduction in Area: -25% % Reduction in Volume: -1149.9% Epithelialization: None Wound Description Classification: Grade 2 Exudate Amount: Medium Exudate Type: Serosanguineous Exudate Color: red, brown Foul Odor After Cleansing: No Wound Bed Granulation Amount: Medium (34-66%) Exposed Structure Necrotic Amount: Medium (34-66%) Fascia Exposed: No Necrotic Quality: Adherent  Slough Fat Layer (Subcutaneous Tissue) Exposed: Yes Tendon Exposed: No Muscle Exposed: No Joint Exposed: No Bone Exposed: No Treatment Notes Wound #7 (Lower Leg) Wound Laterality: Left, Anterior Cleanser Soap and Water Discharge Instruction: Gently cleanse wound with antibacterial soap, rinse and pat dry prior to dressing wounds Peri-Wound Care Topical Primary Dressing Silvercel 4 1/4x 4 1/4 (in/in) Discharge Instruction: Apply Silvercel 4 1/4x 4 1/4 (in/in) as instructed Secondary Dressing ABD Pad 5x9 (in/in) Discharge Instruction: Cover with ABD pad Secured With Compression Wrap Profore Lite LF 3 Multilayer Compression [place] Sirmon, [place] ([id number]) Discharge Instruction: Apply 3 multi-layer wrap as prescribed. Compression Stockings Add-Ons Electronic Signature(s) Signed: 10/10/2020 10:03:03 AM By: Donnamarie Poag Entered By: Donnamarie Poag on 10/10/2020 08:35:00 Drewes, [name] ([id number]) -------------------------------------------------------------------------------- Wound Assessment Details Patient Name: [name] Date of Service: 10/10/2020 8:30 AM Medical Record Number: [id number] Patient Account Number: [id number] Date of Birth/Sex:  1965-07-03 (55 y.o. F) Treating RN: Donnamarie Poag Primary Care Cyrus Ramsburg: Laurance Flatten Other Clinician: Referring Desmon Hitchner: Laurance Flatten Treating Laelia Angelo/Extender: Jeri Cos Weeks in Treatment: 1 Wound Status Wound Number: 8 Primary Diabetic Wound/Ulcer of the Lower Extremity Etiology: Wound Location: Left, Posterior Lower Leg Wound Open Wounding Event: Gradually Appeared Status: Date Acquired: 09/20/2020 Comorbid Congestive Heart Failure, Coronary Artery Disease, Weeks Of Treatment: 1 History: Hypertension, Myocardial Infarction, Peripheral Venous Clustered Wound: No Disease, Type II Diabetes, End Stage Renal Disease, Neuropathy Wound Measurements Length: (cm) 2 Width: (cm) 4 Depth: (cm) 0.1 Area: (cm)  6.283 Volume: (cm) 0.628 % Reduction in Area: -113.3% % Reduction in Volume: -112.9% Epithelialization: None Wound Description Classification: Grade 2 Exudate Amount: Medium Exudate Type: Serosanguineous Exudate Color: red, brown Foul Odor After Cleansing: No Slough/Fibrino Yes Wound Bed Granulation Amount: Small (1-33%) Exposed Structure Granulation Quality: Pink Fascia Exposed: No Necrotic Amount: Large (67-100%) Fat Layer (Subcutaneous Tissue) Exposed: Yes Necrotic Quality: Adherent Slough Tendon Exposed: No Muscle Exposed: No Joint Exposed: No Bone Exposed: No Treatment Notes Wound #8 (Lower Leg) Wound Laterality: Left, Posterior Cleanser Soap and Water Discharge Instruction: Gently cleanse wound with antibacterial soap, rinse and pat dry prior to dressing wounds Peri-Wound Care Topical Primary Dressing Silvercel 4 1/4x 4 1/4 (in/in) Discharge Instruction: Apply Silvercel 4 1/4x 4 1/4 (in/in) as instructed Secondary Dressing ABD Pad 5x9 (in/in) Discharge Instruction: Cover with ABD pad Secured With Compression Wrap Profore Lite LF 3 Multilayer Compression [place] Markert, Jasamine ([id number]) Discharge Instruction: Apply 3 multi-layer wrap as prescribed. Compression Stockings Circaid Juxta Lite Compression Wrap Quantity: 1 Left Leg Compression Amount: 30-40 mmHg Discharge Instruction: Apply Circaid Juxta Lite Compression Wrap as directed Add-Ons Electronic Signature(s) Signed: 10/10/2020 10:03:03 AM By: Donnamarie Poag Entered ByDonnamarie Poag on 10/10/2020 08:35:07
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
## 2020-10-11 ENCOUNTER — Telehealth (HOSPITAL_COMMUNITY): Payer: Self-pay | Admitting: *Deleted

## 2020-10-11 NOTE — Telephone Encounter (Signed)
[facility] called and asked if pt was actively being treated for CHF and if it was ok for patient to use compression pump for swollen legs.    Routed to [place] for advice  Callback # [phone]

## 2020-10-12 ENCOUNTER — Other Ambulatory Visit (HOSPITAL_COMMUNITY): Payer: Self-pay | Admitting: Cardiology

## 2020-10-13 NOTE — Telephone Encounter (Signed)
That would be fine 

## 2020-10-14 ENCOUNTER — Encounter: Payer: Medicare Other | Admitting: Physician Assistant

## 2020-10-14 NOTE — Telephone Encounter (Signed)
Called [facility] they are aware and thanked me for the call.

## 2020-10-16 ENCOUNTER — Encounter (HOSPITAL_BASED_OUTPATIENT_CLINIC_OR_DEPARTMENT_OTHER): Payer: Medicare Other | Admitting: Internal Medicine

## 2020-10-16 ENCOUNTER — Other Ambulatory Visit: Payer: Self-pay

## 2020-10-16 DIAGNOSIS — I872 Venous insufficiency (chronic) (peripheral): Secondary | ICD-10-CM

## 2020-10-16 DIAGNOSIS — L97822 Non-pressure chronic ulcer of other part of left lower leg with fat layer exposed: Secondary | ICD-10-CM

## 2020-10-16 DIAGNOSIS — I890 Lymphedema, not elsewhere classified: Secondary | ICD-10-CM

## 2020-10-19 NOTE — Progress Notes (Signed)
DESTONY, PREVOST ([id number]) Visit Report for 10/16/2020 Chief Complaint Document Details Patient Name: [name] Date of Service: 10/16/2020 3:30 PM Medical Record Number: [id number] Patient Account Number: [id number] Date of Birth/Sex: 11/03/1965 (55 y.o. F) Treating RN: Carlene Coria Primary Care Provider: Laurance Flatten Other Clinician: Referring Provider: Laurance Flatten Treating Provider/Extender: Yaakov Guthrie in Treatment: 2 Information Obtained from: Patient Chief Complaint Left LE Ulcer Electronic Signature(s) Signed: 10/16/2020 4:59:51 PM By: Kalman Shan DO Entered By: Kalman Shan on 10/16/2020 16:56:21 Brosky, Glennette ([id number]) -------------------------------------------------------------------------------- HPI Details Patient Name: [name] Date of Service: 10/16/2020 3:30 PM Medical Record Number: [id number] Patient Account Number: [id number] Date of Birth/Sex: 11-12-1965 (55 y.o. F) Treating RN: Carlene Coria Primary Care Provider: Laurance Flatten Other Clinician: Referring Provider: Laurance Flatten Treating Provider/Extender: Yaakov Guthrie in Treatment: 2 History of Present Illness HPI Description: 10/24/2018 on evaluation today patient presents for initial evaluation or clinic concerning issues that she has been having with lymphedema for quite some time. Unfortunately she has several wound openings at this point that secondary to her lymphedema/venous stasis are giving her trouble and leaking quite severely. She also has diabetes along with hypertension and stage III chronic kidney disease. Fortunately there is no signs of active infection at this time. She is going to likely require debridement of the left leg ulcer upon evaluation today just based on what I am seeing. Fortunately there is no wound opening on the right. Overall the patient seems to be doing quite well and again there is no evidence of systemic infection which is good news. No  fevers, chills, nausea, vomiting, or diarrhea. 10/31/2018 on evaluation today patient actually appears to be doing excellent in regard to her lower extremity ulcers. She has been tolerating the dressing changes without complication. Fortunately there is no signs of active infection at this time. She has tolerated 3 layer compression wrap without complication. 11/07/2018 upon evaluation today patient actually appears to be doing very well with regard to her left lower extremity ulcers. She has been tolerating the dressing changes without complication. Fortunately there is no signs of active infection. No fevers, chills, nausea, vomiting, or diarrhea. 11/21/2018 upon evaluation today patient appears to be doing quite well with regard to her lower extremity ulcers. In fact both areas seem to be showing signs of good improvement which is excellent. She is not having as much pain as she has in the past and again has a lot of healing compared to previous visits as well. 12/05/2018 on evaluation today patient presents for follow-up concerning her ongoing issues with her bilateral lower extremity ulcers. The good news is her right lower extremity is showing signs of completely healing at this time which is great news. Fortunately there is no evidence of active infection. On the left she has just a very small area still remaining that is open at this time all in all she is very close to complete closure in my opinion. She does have compression stockings to wear at home. 12/12/2018 on evaluation today patient appears to be doing excellent in regard to her left lateral lower extremity ulcer. She has been tolerating the dressing changes without complication. Fortunately there is no signs of active infection at this time. In fact this appears to be pretty much healed at this point although again she is not 100% today. No fevers, chills, nausea, vomiting, or diarrhea. 12/19/2018 on evaluation today patient actually  appears to be doing excellent with regard to her lower extremity ulcer in fact  this appears to be completely healed today which is all some. She has done extremely well with wound care measures. No fevers, chills, nausea, vomiting, or diarrhea. ------------------------------------ 11/01/19-Readmission to the clinic Patient presents with left leg pain, wounds posterior calf, onset about 4 weeks, denies any fevers chills or shakes, has not been using anything to these wounds, was given compression stockings at last discharge from the clinic but has not been able to use them as they rolled down and cause creases Patient's history significant for type 2 diabetes insulin requiring, A1c of 6.9 lately, hypertension, chronic pain 8/18; patient readmitted that the clinic last week. She has wounds on her left lateral posterior lower leg all of this in close juxtaposition i.e. a localized site. We've been using silver alginate under 3 layer compression apparently the wound surface area is better. She was wearing compression stockings from elastic therapy but says they were falling down. As she progresses more towards healing will need to address what we use in terms of compression stockings perhaps external compression garments 11/17/2019 on evaluation today patient appears to be doing well with regard to her legs currently. She does have 2 wounds which are actually measuring much better the more posterior is actually doing very well which I am pleased with the other though smaller is not quite a small but nonetheless on the lateral aspect does seem to be improving. 11/23/2019 on evaluation today patient appears to be doing well in regard to her wounds. In fact the wound on the posterior aspect of her leg appears healed the lateral aspect is still open but extremely small. 9/15; this is a patient with chronic venous insufficiency and secondary lymphedema. She did not keep her clinic appointment last week and  hence the left leg is a lot more swollen since she had to take the wrap off at some point. She has a small weeping area on the left lateral lower leg. She has probably a juxta lite stocking for the left leg I have asked her to bring that in when she comes to her clinic appointment next week at which time she should be healed 10/6; this is a patient with chronic venous insufficiency and secondary lymphedema left greater than right. Skin changes of chronic lymphedema especially in the left leg. She has not been here in 3 weeks. She took the wrap off 2 weeks ago. She has some very old 20/30 stockings from elastic therapy in [place] she has been using. Fortunately her leg is closed. She also has a single Farrow wrap for the left leg Readmission: 06/18/2020 upon evaluation today patient presents for reevaluation here in the clinic. She is a long-term patient that we have seen intermittently when she has had flareups of issues with her lower extremity secondary to chronic venous stasis. With that being said unfortunately today she is having a significant issue with a wound which is actually quite significant over the right lower extremity. She did see dermatology they put Vaseline followed by Telfa island dressings on her unfortunately she is continuing to have significant issues however here with pain and in fact this wrap was extremely stuck to her upon evaluation today. Nonetheless I believe that she is going to require a little bit different approach to try to keep things from sticking and hopefully aid in getting this to heal more effectively and quickly. With that being said she does have a history of Morre, Amyah ([id number]) chronic venous insufficiency, lymphedema, diabetes mellitus type 2, chronic kidney  disease stage III, hypertension, and congestive heart failure. This started as an injury in mid February and has worsened over the past 2 weeks. 07/04/2020 upon evaluation today patient appears  to be doing well with regard to her wound on the leg. Overall I am extremely pleased with where things stand and I do think that she is making excellent progress. There is no sign of infection right now which is also great news. I think that she is close to complete resolution. 07/09/20 upon evaluation today patient appears to be doing excellent in regard to her leg ulcer. She has been tolerating the dressing changes without complication and in general I am extremely pleased with where things stand today. There does not appear to be any signs of active infection at this time which is great news. 07/25/2020 upon evaluation today patient appears to be doing well with regard to her wound. She has been tolerating the dressing changes without complication. Fortunately there is no signs of active infection at this time and her wound appears to be completely healed. Readmission: 09/30/2020 upon evaluation today patient appears for reevaluation here in our clinic. She was discharged May 5 that unfortunately has started to have weeping and wounds on the left leg at this point. She tells me the right leg did weep some but nothing as significant as the left. With all that being said at this point the patient states that she figured it was best to come in here and she was having a lot of drainage and it was get all of her sheets and everything. She has been wearing her Velcro wraps sometimes but it does not sound like she is been wearing them all the time which I think is part of the reason why this reopened. She also unfortunately had a fall which was quite significant. This occurred I believe about a week or 2 ago. Otherwise patient's past medical history really has not changed significantly. 10/07/2020 upon evaluation today patient appears to be doing better in regard to her wounds of the lower extremities. Fortunately there does not appear to be any signs of active infection at this time which is great news. I do  not see any evidence of anything worsening but again she continues to have significant bilateral stage III lymphedema. This has been an ongoing issue. We actually have seen her this year pretty much from March through May of 2022. Following this she continued with compression at home following but then unfortunately reopened again. That was on July 11. With that being said this is an ongoing and recurrent issue for which honestly have seen her over the past 2 years pretty much consistently. Nonetheless we have had Velcro compression wraps that she uses at home and despite this she continues to have frequent and recurrent ulcerations. 7/27; patient presents for 1 week follow-up. She has no issues or complaints today. She denies signs of infection. Electronic Signature(s) Signed: 10/16/2020 4:59:51 PM By: Kalman Shan DO Entered By: Kalman Shan on 10/16/2020 16:56:50 Thune, Breta ([id number]) -------------------------------------------------------------------------------- Physical Exam Details Patient Name: [name] Date of Service: 10/16/2020 3:30 PM Medical Record Number: [id number] Patient Account Number: [id number] Date of Birth/Sex: October 31, 1965 (55 y.o. F) Treating RN: Carlene Coria Primary Care Provider: Laurance Flatten Other Clinician: Referring Provider: Laurance Flatten Treating Provider/Extender: Yaakov Guthrie in Treatment: 2 Constitutional . Psychiatric . Notes Left lower extremity: Open wounds with granulation tissue and nonviable tissue present. No signs of infection. 2+ pitting edema to the knee. Electronic  Signature(s) Signed: 10/16/2020 4:59:51 PM By: Kalman Shan DO Entered By: Kalman Shan on 10/16/2020 16:57:52 Mccollister, [name] ([id number]) -------------------------------------------------------------------------------- Physician Orders Details Patient Name: [name] Date of Service: 10/16/2020 3:30 PM Medical Record Number:  [id number] Patient Account Number: [id number] Date of Birth/Sex: 01/30/1966 (55 y.o. F) Treating RN: Carlene Coria Primary Care Provider: Laurance Flatten Other Clinician: Referring Provider: Laurance Flatten Treating Provider/Extender: Yaakov Guthrie in Treatment: 2 Verbal / Phone Orders: No Diagnosis Coding Follow-up Appointments o Return Appointment in 1 week. Bathing/ Shower/ Hygiene o May shower with wound dressing protected with water repellent cover or cast protector. Edema Control - Lymphedema / Segmental Compressive Device / Other o 3 Layer Compression System for Lymphedema. - right lower leg o Elevate, Exercise Daily and Avoid Standing for Long Periods of Time. o Elevate legs to the level of the heart and pump ankles as often as possible o Elevate leg(s) parallel to the floor when sitting. o Compression Pump: Use compression pump on left lower extremity for 60 minutes, twice daily. o Compression Pump: Use compression pump on right lower extremity for 60 minutes, twice daily. Wound Treatment Wound #7 - Lower Leg Wound Laterality: Left, Anterior Cleanser: Soap and Water 1 x Per Week/30 Days Discharge Instructions: Gently cleanse wound with antibacterial soap, rinse and pat dry prior to dressing wounds Primary Dressing: Silvercel 4 1/4x 4 1/4 (in/in) 1 x Per Week/30 Days Discharge Instructions: Apply Silvercel 4 1/4x 4 1/4 (in/in) as instructed Secondary Dressing: ABD Pad 5x9 (in/in) 1 x Per Week/30 Days Discharge Instructions: Cover with ABD pad Compression Wrap: Profore Lite LF 3 Multilayer Compression Bandaging System 1 x Per Week/30 Days Discharge Instructions: Apply 3 multi-layer wrap as prescribed. Wound #8 - Lower Leg Wound Laterality: Left, Posterior Cleanser: Soap and Water 1 x Per Week/30 Days Discharge Instructions: Gently cleanse wound with antibacterial soap, rinse and pat dry prior to dressing wounds Primary Dressing: Silvercel 4 1/4x 4 1/4 (in/in) 1 x  Per Week/30 Days Discharge Instructions: Apply Silvercel 4 1/4x 4 1/4 (in/in) as instructed Secondary Dressing: ABD Pad 5x9 (in/in) 1 x Per Week/30 Days Discharge Instructions: Cover with ABD pad Compression Wrap: Profore Lite LF 3 Multilayer Compression Bandaging System 1 x Per Week/30 Days Discharge Instructions: Apply 3 multi-layer wrap as prescribed. Compression Stockings: Circaid Juxta Lite Compression Wrap Left Leg Compression Amount: 30-40 mmHG Discharge Instructions: Apply Circaid Juxta Lite Compression Wrap as directed Electronic Signature(s) Signed: 10/16/2020 4:59:51 PM By: Kalman Shan DO Signed: 10/18/2020 4:32:50 PM By: Carlene Coria RN Entered By: Carlene Coria on 10/16/2020 16:38:56 [place], Walker ([id number]) -------------------------------------------------------------------------------- Problem List Details Patient Name: [name] Date of Service: 10/16/2020 3:30 PM Medical Record Number: [id number] Patient Account Number: [id number] Date of Birth/Sex: [birth year] (55 y.o. F) Treating RN: Carlene Coria Primary Care Provider: Laurance Flatten Other Clinician: Referring Provider: Laurance Flatten Treating Provider/Extender: Yaakov Guthrie in Treatment: 2 Active Problems ICD-10 Encounter Code Description Active Date MDM Diagnosis I87.2 Venous insufficiency (chronic) (peripheral) 09/30/2020 No Yes I89.0 Lymphedema, not elsewhere classified 09/30/2020 No Yes L97.822 Non-pressure chronic ulcer of other part of left lower leg with fat layer 09/30/2020 No Yes exposed E11.622 Type 2 diabetes mellitus with other skin ulcer 09/30/2020 No Yes N18.30 Chronic kidney disease, stage 3 unspecified 09/30/2020 No Yes I10 Essential (primary) hypertension 09/30/2020 No Yes 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 F) Treating RN: Carlene Coria Primary Care Provider: Laurance Flatten Other Clinician: Referring Provider: Laurance Flatten Treating Provider/Extender: Yaakov Guthrie in Treatment: 2 Subjective Chief Complaint Information obtained from Patient Left LE Ulcer History of Present Illness (HPI) 10/24/2018 on evaluation today patient presents for initial evaluation or clinic concerning issues that she has been having with lymphedema for quite some time. Unfortunately she has several wound openings at this point that secondary to her lymphedema/venous stasis are giving her trouble and leaking quite severely. She also has diabetes along with hypertension and stage III chronic kidney disease. Fortunately there is no signs of active infection at this time. She is going to likely require debridement of the left leg ulcer upon evaluation today just based on what I am seeing. Fortunately there is no wound opening on the right. Overall the patient seems to be doing quite well and again there is no evidence of systemic infection which is good news. No fevers, chills, nausea, vomiting, or diarrhea. 10/31/2018 on evaluation today patient actually appears to be doing excellent in regard to her lower extremity ulcers. She has been tolerating the dressing changes without complication. Fortunately there is no signs of active infection at this time. She has tolerated 3 layer compression wrap without complication. 11/07/2018 upon evaluation today patient actually appears to be doing very well with regard to her left lower extremity ulcers. She has been tolerating the  dressing changes without complication. Fortunately there is no signs of active infection. No fevers, chills, nausea, vomiting, or diarrhea. 11/21/2018 upon evaluation today patient appears to be doing quite well with regard to her lower extremity ulcers. In fact both areas seem to be showing signs of good improvement which is excellent. She is not having as much pain as she has in the past and again has a lot of healing compared to previous visits as well. 12/05/2018 on evaluation today patient presents for follow-up concerning her ongoing issues with her bilateral lower extremity ulcers. The good news is her right lower extremity is showing signs of completely healing at this time which is great news. Fortunately there is no evidence of active infection. On the left she has just a very small area still remaining that is open at this time all in all she is very close to complete closure in my opinion. She does have compression stockings to wear at home. 12/12/2018 on evaluation today patient appears to be doing excellent in regard to her left lateral lower extremity ulcer. She has been tolerating the dressing changes without complication. Fortunately there is no signs of active infection at this time. In fact this appears to be pretty much healed at this point although again she is not 100% today. No fevers, chills, nausea, vomiting, or diarrhea. 12/19/2018 on evaluation today patient actually appears to be doing excellent with regard to her lower extremity ulcer in fact this appears to be completely healed today which is all some. She has done extremely well with wound care measures. No fevers, chills, nausea, vomiting, or diarrhea. ------------------------------------ 11/01/19-Readmission to the clinic Patient presents with left leg pain, wounds posterior calf, onset about 4 weeks, denies any fevers chills or shakes, has not been using anything to these wounds, was given compression stockings at last  discharge from the clinic but has not been  able to use them as they rolled down and cause creases Patient's history significant for type 2 diabetes insulin requiring, A1c of 6.9 lately, hypertension, chronic pain 8/18; patient readmitted that the clinic last week. She has wounds on her left lateral posterior lower leg all of this in close juxtaposition i.e. a localized site. We've been using silver alginate under 3 layer compression apparently the wound surface area is better. She was wearing compression stockings from elastic therapy but says they were falling down. As she progresses more towards healing will need to address what we use in terms of compression stockings perhaps external compression garments 11/17/2019 on evaluation today patient appears to be doing well with regard to her legs currently. She does have 2 wounds which are actually measuring much better the more posterior is actually doing very well which I am pleased with the other though smaller is not quite a small but nonetheless on the lateral aspect does seem to be improving. 11/23/2019 on evaluation today patient appears to be doing well in regard to her wounds. In fact the wound on the posterior aspect of her leg appears healed the lateral aspect is still open but extremely small. 9/15; this is a patient with chronic venous insufficiency and secondary lymphedema. She did not keep her clinic appointment last week and hence the left leg is a lot more swollen since she had to take the wrap off at some point. She has a small weeping area on the left lateral lower leg. She has probably a juxta lite stocking for the left leg I have asked her to bring that in when she comes to her clinic appointment next week at which time she should be healed 10/6; this is a patient with chronic venous insufficiency and secondary lymphedema left greater than right. Skin changes of chronic lymphedema especially in the left leg. She has not been here in  3 weeks. She took the wrap off 2 weeks ago. She has some very old 20/30 stockings from elastic therapy in [place] she has been using. Fortunately her leg is closed. She also has a single Farrow wrap for the left leg Readmission: 06/18/2020 upon evaluation today patient presents for reevaluation here in the clinic. She is a long-term patient that we have seen intermittently Wotton, Sangeeta ([id number]) when she has had flareups of issues with her lower extremity secondary to chronic venous stasis. With that being said unfortunately today she is having a significant issue with a wound which is actually quite significant over the right lower extremity. She did see dermatology they put Vaseline followed by Telfa island dressings on her unfortunately she is continuing to have significant issues however here with pain and in fact this wrap was extremely stuck to her upon evaluation today. Nonetheless I believe that she is going to require a little bit different approach to try to keep things from sticking and hopefully aid in getting this to heal more effectively and quickly. With that being said she does have a history of chronic venous insufficiency, lymphedema, diabetes mellitus type 2, chronic kidney disease stage III, hypertension, and congestive heart failure. This started as an injury in mid February and has worsened over the past 2 weeks. 07/04/2020 upon evaluation today patient appears to be doing well with regard to her wound on the leg. Overall I am extremely pleased with where things stand and I do think that she is making excellent progress. There is no sign of infection right now which is also great  news. I think that she is close to complete resolution. 07/09/20 upon evaluation today patient appears to be doing excellent in regard to her leg ulcer. She has been tolerating the dressing changes without complication and in general I am extremely pleased with where things stand today. There does  not appear to be any signs of active infection at this time which is great news. 07/25/2020 upon evaluation today patient appears to be doing well with regard to her wound. She has been tolerating the dressing changes without complication. Fortunately there is no signs of active infection at this time and her wound appears to be completely healed. Readmission: 09/30/2020 upon evaluation today patient appears for reevaluation here in our clinic. She was discharged May 5 that unfortunately has started to have weeping and wounds on the left leg at this point. She tells me the right leg did weep some but nothing as significant as the left. With all that being said at this point the patient states that she figured it was best to come in here and she was having a lot of drainage and it was get all of her sheets and everything. She has been wearing her Velcro wraps sometimes but it does not sound like she is been wearing them all the time which I think is part of the reason why this reopened. She also unfortunately had a fall which was quite significant. This occurred I believe about a week or 2 ago. Otherwise patient's past medical history really has not changed significantly. 10/07/2020 upon evaluation today patient appears to be doing better in regard to her wounds of the lower extremities. Fortunately there does not appear to be any signs of active infection at this time which is great news. I do not see any evidence of anything worsening but again she continues to have significant bilateral stage III lymphedema. This has been an ongoing issue. We actually have seen her this year pretty much from March through May of 2022. Following this she continued with compression at home following but then unfortunately reopened again. That was on July 11. With that being said this is an ongoing and recurrent issue for which honestly have seen her over the past 2 years pretty much consistently. Nonetheless we have had  Velcro compression wraps that she uses at home and despite this she continues to have frequent and recurrent ulcerations. 7/27; patient presents for 1 week follow-up. She has no issues or complaints today. She denies signs of infection. Patient History Information obtained from Patient. Family History Cancer - Father,Siblings, Diabetes - Mother,Siblings, Heart Disease - Mother,Maternal Grandparents, Hypertension - Mother,Maternal Grandparents, No family history of Hereditary Spherocytosis, Kidney Disease, Lung Disease, Seizures, Stroke, Thyroid Problems, Tuberculosis. Social History Never smoker, Marital Status - Divorced, Alcohol Use - Never, Drug Use - No History, Caffeine Use - Never. Medical History Eyes Denies history of Cataracts, Glaucoma, Optic Neuritis Ear/Nose/Mouth/Throat Denies history of Chronic sinus problems/congestion, Middle ear problems Hematologic/Lymphatic Denies history of Anemia, Hemophilia, Human Immunodeficiency Virus, Lymphedema, Sickle Cell Disease Respiratory Denies history of Aspiration, Asthma, Chronic Obstructive Pulmonary Disease (COPD), Pneumothorax, Sleep Apnea, Tuberculosis Cardiovascular Patient has history of Congestive Heart Failure, Coronary Artery Disease - CABG 2010, Hypertension, Myocardial Infarction - November 2021, Peripheral Venous Disease Denies history of Angina, Arrhythmia, Deep Vein Thrombosis, Hypotension, Peripheral Arterial Disease, Phlebitis, Vasculitis Gastrointestinal Denies history of Cirrhosis , Colitis, Crohn s, Hepatitis A, Hepatitis B, Hepatitis C Endocrine Patient has history of Type II Diabetes Denies history of Type I Diabetes  Genitourinary Patient has history of 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 Immunological Denies history of Lupus Erythematosus, Raynaud s, Scleroderma Integumentary (Skin) Denies history of History of Burn, History of pressure wounds Musculoskeletal Denies history of Gout, Rheumatoid Arthritis,  Osteoarthritis, Osteomyelitis Neurologic Patient has history of Neuropathy Denies history of Dementia, Quadriplegia, Paraplegia, Seizure Disorder Oncologic Loflin, Shacola ([id number]) Denies history of Received Chemotherapy, Received Radiation Psychiatric Denies history of Anorexia/bulimia, Confinement Anxiety Hospitalization/Surgery History - c section. - hernia. - cabg. Medical And Surgical History Notes Cardiovascular HLD Objective Constitutional Vitals Time Taken: 4:24 PM, Temperature: 98.2 F, Pulse: 87 bpm, Respiratory Rate: 18 breaths/min, Blood Pressure: 123/57 mmHg. General Notes: Left lower extremity: Open wounds with granulation tissue and nonviable tissue present. No signs of infection. 2+ pitting edema to the knee. Integumentary (Hair, Skin) Wound #7 status is Open. Original cause of wound was Gradually Appeared. The date acquired was: 09/20/2020. The wound has been in treatment 2 weeks. The wound is located on the Left,Anterior Lower Leg. The wound measures 10cm length x 5cm width x 0.1cm depth; 39.27cm^2 area and 3.927cm^3 volume. There is Fat Layer (Subcutaneous Tissue) exposed. There is no tunneling or undermining noted. There is a medium amount of serosanguineous drainage noted. There is medium (34-66%) granulation within the wound bed. There is a medium (34-66%) amount of necrotic tissue within the wound bed including Adherent Slough. Wound #8 status is Open. Original cause of wound was Gradually Appeared. The date acquired was: 09/20/2020. The wound has been in treatment 2 weeks. The wound is located on the Left,Posterior Lower Leg. The wound measures 2cm length x 4cm width x 0.1cm depth; 6.283cm^2 area and 0.628cm^3 volume. There is Fat Layer (Subcutaneous Tissue) exposed. There is no tunneling or undermining noted. There is a medium amount of serosanguineous drainage noted. There is small (1-33%) pink granulation within the wound bed. There is a large (67-100%) amount  of necrotic tissue within the wound bed including Adherent Slough. Assessment Active Problems ICD-10 Venous insufficiency (chronic) (peripheral) Lymphedema, not elsewhere classified Non-pressure chronic ulcer of other part of left lower leg with fat layer exposed Type 2 diabetes mellitus with other skin ulcer Chronic kidney disease, stage 3 unspecified Essential (primary) hypertension Chronic combined systolic (congestive) and diastolic (congestive) heart failure Patient's wounds are stable. I recommended continuing silver alginate under 3 layer compression.Follow-up next week with Margarita Grizzle. Procedures Wound #7 Pre-procedure diagnosis of Wound #7 is a Diabetic Wound/Ulcer of the Lower Extremity located on the Left,Anterior Lower Leg . There was a Three Layer Compression Therapy Procedure by Carlene Coria, RN. Post procedure Diagnosis Wound #7: Same as Pre-Procedure Trickett, Shellia ([id number]) Wound #8 Pre-procedure diagnosis of Wound #8 is a Diabetic Wound/Ulcer of the Lower Extremity located on the Left,Posterior Lower Leg . There was a Three Layer Compression Therapy Procedure by Carlene Coria, RN. Post procedure Diagnosis Wound #8: Same as Pre-Procedure Plan Follow-up Appointments: Return Appointment in 1 week. Bathing/ Shower/ Hygiene: May shower with wound dressing protected with water repellent cover or cast protector. Edema Control - Lymphedema / Segmental Compressive Device / Other: 3 Layer Compression System for Lymphedema. - right lower leg Elevate, Exercise Daily and Avoid Standing for Long Periods of Time. Elevate legs to the level of the heart and pump ankles as often as possible Elevate leg(s) parallel to the floor when sitting. Compression Pump: Use compression pump on left lower extremity for 60 minutes, twice daily. Compression Pump: Use compression pump on right lower extremity for 60 minutes, twice  daily. WOUND #7: - Lower Leg Wound Laterality: Left,  Anterior Cleanser: Soap and Water 1 x Per Week/30 Days Discharge Instructions: Gently cleanse wound with antibacterial soap, rinse and pat dry prior to dressing wounds Primary Dressing: Silvercel 4 1/4x 4 1/4 (in/in) 1 x Per Week/30 Days Discharge Instructions: Apply Silvercel 4 1/4x 4 1/4 (in/in) as instructed Secondary Dressing: ABD Pad 5x9 (in/in) 1 x Per Week/30 Days Discharge Instructions: Cover with ABD pad Compression Wrap: Profore Lite LF 3 Multilayer Compression Bandaging System 1 x Per Week/30 Days Discharge Instructions: Apply 3 multi-layer wrap as prescribed. WOUND #8: - Lower Leg Wound Laterality: Left, Posterior Cleanser: Soap and Water 1 x Per Week/30 Days Discharge Instructions: Gently cleanse wound with antibacterial soap, rinse and pat dry prior to dressing wounds Primary Dressing: Silvercel 4 1/4x 4 1/4 (in/in) 1 x Per Week/30 Days Discharge Instructions: Apply Silvercel 4 1/4x 4 1/4 (in/in) as instructed Secondary Dressing: ABD Pad 5x9 (in/in) 1 x Per Week/30 Days Discharge Instructions: Cover with ABD pad Compression Wrap: Profore Lite LF 3 Multilayer Compression Bandaging System 1 x Per Week/30 Days Discharge Instructions: Apply 3 multi-layer wrap as prescribed. Compression Stockings: Circaid Juxta Lite Compression Wrap Compression Amount: 30-40 mmHg (left) Discharge Instructions: Apply Circaid Juxta Lite Compression Wrap as directed 1. Silver alginate under 3 layer compression 2. Follow-up with Margarita Grizzle next week Electronic Signature(s) Signed: 10/16/2020 4:59:51 PM By: Kalman Shan DO Entered By: Kalman Shan on 10/16/2020 16:59:04 Esterline, Daleah ([id number]) -------------------------------------------------------------------------------- ROS/PFSH Details Patient Name: [name] Date of Service: 10/16/2020 3:30 PM Medical Record Number: [id number] Patient Account Number: [id number] Date of Birth/Sex: 1965/11/23 (55 y.o. F) Treating RN: Carlene Coria Primary Care Provider: Laurance Flatten Other Clinician: Referring Provider: Laurance Flatten Treating Provider/Extender: Yaakov Guthrie in Treatment: 2 Information Obtained From Patient Eyes Medical History: Negative for: Cataracts; Glaucoma; Optic Neuritis Ear/Nose/Mouth/Throat Medical History: Negative for: Chronic sinus problems/congestion; Middle ear problems Hematologic/Lymphatic Medical History: Negative for: Anemia; Hemophilia; Human Immunodeficiency Virus; Lymphedema; Sickle Cell Disease Respiratory Medical History: Negative for: Aspiration; Asthma; Chronic Obstructive Pulmonary Disease (COPD); Pneumothorax; Sleep Apnea; Tuberculosis Cardiovascular Medical History: Positive for: Congestive Heart Failure; Coronary Artery Disease - CABG 2010; Hypertension; Myocardial Infarction - November 2021; Peripheral Venous Disease Negative for: Angina; Arrhythmia; Deep Vein Thrombosis; Hypotension; Peripheral Arterial Disease; Phlebitis; Vasculitis Past Medical History Notes: HLD Gastrointestinal Medical History: Negative for: Cirrhosis ; Colitis; Crohnos; Hepatitis A; Hepatitis B; Hepatitis C Endocrine Medical History: Positive for: Type II Diabetes Negative for: Type I Diabetes Time with diabetes: 1997 Treated with: Insulin Blood sugar tested every day: Yes Tested : 3 x daily Genitourinary Medical History: Positive for: 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 Immunological Medical History: Negative for: Lupus Erythematosus; Raynaudos; Scleroderma Integumentary (Skin) Dahm, Tamorah ([id number]) Medical History: Negative for: History of Burn; History of pressure wounds Musculoskeletal Medical History: Negative for: Gout; Rheumatoid Arthritis; Osteoarthritis; Osteomyelitis Neurologic Medical History: Positive for: Neuropathy Negative for: Dementia; Quadriplegia; Paraplegia; Seizure Disorder Oncologic Medical History: Negative for: Received Chemotherapy; Received  Radiation Psychiatric Medical History: Negative for: Anorexia/bulimia; Confinement Anxiety Immunizations Pneumococcal Vaccine: Received Pneumococcal Vaccination: Yes Received Pneumococcal Vaccination On or After 60th Birthday: No Implantable Devices None Hospitalization / Surgery History Type of Hospitalization/Surgery c section hernia cabg Family and Social History Cancer: Yes - Father,Siblings; Diabetes: Yes - Mother,Siblings; Heart Disease: Yes - Mother,Maternal Grandparents; Hereditary Spherocytosis: No; Hypertension: Yes - Mother,Maternal Grandparents; Kidney Disease: No; Lung Disease: No; Seizures: No; Stroke: No; Thyroid Problems: No; Tuberculosis: No; Never smoker; Marital Status -  Divorced; Alcohol Use: Never; Drug Use: No History; Caffeine Use: Never; Financial Concerns: No; Food, Clothing or Shelter Needs: No; Support System Lacking: No; Transportation Concerns: No Electronic Signature(s) Signed: 10/16/2020 4:59:51 PM By: Kalman Shan DO Signed: 10/18/2020 4:32:50 PM By: Carlene Coria RN Entered By: Kalman Shan on 10/16/2020 16:57:07 Buehler, Arrion ([id number]) -------------------------------------------------------------------------------- [facility] Details Patient Name: [name] Date of Service: 10/16/2020 Medical Record Number: [id number] Patient Account Number: [id number] Date of Birth/Sex: [birth year] (55 y.o. F) Treating RN: Carlene Coria Primary Care Provider: Laurance Flatten Other Clinician: Referring Provider: Laurance Flatten Treating Provider/Extender: Yaakov Guthrie in Treatment: 2 Diagnosis Coding ICD-10 Codes Code Description I87.2 Venous insufficiency (chronic) (peripheral) I89.0 Lymphedema, not elsewhere classified L97.822 Non-pressure chronic ulcer of other part of left lower leg with fat layer exposed E11.622 Type 2 diabetes mellitus with other skin ulcer N18.30 Chronic kidney disease, stage 3 unspecified I10 Essential (primary)  hypertension I50.42 Chronic combined systolic (congestive) and diastolic (congestive) heart failure Physician Procedures CPT4 Code: [id number] Description: 99213 - WC PHYS LEVEL 3 - EST PT Modifier: Quantity: 1 CPT4 Code: Description: ICD-10 Diagnosis Description L97.822 Non-pressure chronic ulcer of other part of left lower leg with fat lay I87.2 Venous insufficiency (chronic) (peripheral) I89.0 Lymphedema, not elsewhere classified Modifier: er exposed Quantity: Electronic Signature(s) Signed: 10/16/2020 4:59:51 PM By: Kalman Shan DO Entered By: Kalman Shan on 10/16/2020 16:59:30

## 2020-10-19 NOTE — Progress Notes (Signed)
[name], [name] ([id number]) Visit Report for 10/16/2020 Arrival Information Details Patient Name: [name], [name] Date of Service: 10/16/2020 3:30 PM Medical Record Number: [id number] Patient Account Number: [id number] Date of Birth/Sex: 10/11/1965 (55 y.o. F) Treating RN: Carlene Coria Primary Care Marylouise Mallet: Laurance Flatten Other Clinician: Referring Elia Nunley: Laurance Flatten Treating Damaris Geers/Extender: Yaakov Guthrie in Treatment: 2 Visit Information History Since Last Visit All ordered tests and consults were completed: No Patient Arrived: Ambulatory Added or deleted any medications: No Arrival Time: 16:23 Any new allergies or adverse reactions: No Accompanied By: self Had a fall or experienced change in No Transfer Assistance: None activities of daily living that may affect Patient Identification Verified: Yes risk of falls: Secondary Verification Process Completed: Yes Signs or symptoms of abuse/neglect since last visito No Patient Requires Transmission-Based Precautions: No Hospitalized since last visit: No Patient Has Alerts: No Implantable device outside of the clinic excluding No cellular tissue based products placed in the center since last visit: Has Dressing in Place as Prescribed: Yes Has Compression in Place as Prescribed: Yes Pain Present Now: No Electronic Signature(s) Signed: 10/18/2020 4:32:50 PM By: Carlene Coria RN Entered By: Carlene Coria on 10/16/2020 16:24:20 Pickron, Jeidi ([id number]) -------------------------------------------------------------------------------- Clinic Level of Care Assessment Details Patient Name: [name], [name] Date of Service: 10/16/2020 3:30 PM Medical Record Number: [id number] Patient Account Number: [id number] Date of Birth/Sex: 1965/08/26 (55 y.o. F) Treating RN: Carlene Coria Primary Care Arliss Hepburn: Laurance Flatten Other Clinician: Referring Shelise Maron: Laurance Flatten Treating Antoinette Borgwardt/Extender: Yaakov Guthrie in Treatment:  2 Clinic Level of Care Assessment Items TOOL 1 Quantity Score []  - Use when EandM and Procedure is performed on INITIAL visit 0 ASSESSMENTS - Nursing Assessment / Reassessment []  - General Physical Exam (combine w/ comprehensive assessment (listed just below) when performed on new 0 pt. evals) []  - 0 Comprehensive Assessment (HX, ROS, Risk Assessments, Wounds Hx, etc.) ASSESSMENTS - Wound and Skin Assessment / Reassessment []  - Dermatologic / Skin Assessment (not related to wound area) 0 ASSESSMENTS - Ostomy and/or Continence Assessment and Care []  - Incontinence Assessment and Management 0 []  - 0 Ostomy Care Assessment and Management (repouching, etc.) PROCESS - Coordination of Care []  - Simple Patient / Family Education for ongoing care 0 []  - 0 Complex (extensive) Patient / Family Education for ongoing care []  - 0 Staff obtains Programmer, systems, Records, Test Results / Process Orders []  - 0 Staff telephones HHA, Nursing Homes / Clarify orders / etc []  - 0 Routine Transfer to another Facility (non-emergent condition) []  - 0 Routine Hospital Admission (non-emergent condition) []  - 0 New Admissions / Biomedical engineer / Ordering NPWT, Apligraf, etc. []  - 0 Emergency Hospital Admission (emergent condition) PROCESS - Special Needs []  - Pediatric / Minor Patient Management 0 []  - 0 Isolation Patient Management []  - 0 Hearing / Language / Visual special needs []  - 0 Assessment of Community assistance (transportation, D/C planning, etc.) []  - 0 Additional assistance / Altered mentation []  - 0 Support Surface(s) Assessment (bed, cushion, seat, etc.) INTERVENTIONS - Miscellaneous []  - External ear exam 0 []  - 0 Patient Transfer (multiple staff / Civil Service fast streamer / Similar devices) []  - 0 Simple Staple / Suture removal (25 or less) []  - 0 Complex Staple / Suture removal (26 or more) []  - 0 Hypo/Hyperglycemic Management (do not check if billed separately) []  - 0 Ankle /  Brachial Index (ABI) - do not check if billed separately Has the patient been seen at the hospital within the last three years: Yes Total  Score: 0 Level Of Care: ____ [name] ([id number]) Electronic Signature(s) Signed: 10/18/2020 4:32:50 PM By: Carlene Coria RN Entered By: Carlene Coria on 10/16/2020 16:39:08 Geppert, Mayo ([id number]) -------------------------------------------------------------------------------- Compression Therapy Details Patient Name: [name] Date of Service: 10/16/2020 3:30 PM Medical Record Number: [id number] Patient Account Number: [id number] Date of Birth/Sex: [birth year] (55 y.o. F) Treating RN: Carlene Coria Primary Care Brittny Spangle: Laurance Flatten Other Clinician: Referring Jakeia Carreras: Laurance Flatten Treating Tinya Cadogan/Extender: Yaakov Guthrie in Treatment: 2 Compression Therapy Performed for Wound Assessment: Wound #7 Left,Anterior Lower Leg Performed By: Clinician Carlene Coria, RN Compression Type: Three Layer Post Procedure Diagnosis Same as Pre-procedure Electronic Signature(s) Signed: 10/18/2020 4:32:50 PM By: Carlene Coria RN Entered By: Carlene Coria on 10/16/2020 16:37:03 Kissick, Icis ([id number]) -------------------------------------------------------------------------------- Compression Therapy Details Patient Name: [name] Date of Service: 10/16/2020 3:30 PM Medical Record Number: [id number] Patient Account Number: [id number] Date of Birth/Sex: 02/15/1966 (55 y.o. F) Treating RN: Carlene Coria Primary Care Maricela Kawahara: Laurance Flatten Other Clinician: Referring Brita Jurgensen: Laurance Flatten Treating Kyli Sorter/Extender: Yaakov Guthrie in Treatment: 2 Compression Therapy Performed for Wound Assessment: Wound #8 Left,Posterior Lower Leg Performed By: Clinician Carlene Coria, RN Compression Type: Three Layer Post Procedure Diagnosis Same as Pre-procedure Electronic Signature(s) Signed: 10/18/2020 4:32:50 PM By: Carlene Coria RN Entered By:  Carlene Coria on 10/16/2020 16:37:03 Deeley, [name] ([id number]) -------------------------------------------------------------------------------- Encounter Discharge Information Details Patient Name: [name] Date of Service: 10/16/2020 3:30 PM Medical Record Number: [id number] Patient Account Number: [id number] Date of Birth/Sex: 27-Dec-1965 (55 y.o. F) Treating RN: Carlene Coria Primary Care Alycea Segoviano: Laurance Flatten Other Clinician: Referring Moyinoluwa Dawe: Laurance Flatten Treating Kari Kerth/Extender: Yaakov Guthrie in Treatment: 2 Encounter Discharge Information Items Discharge Condition: Stable Ambulatory Status: Ambulatory Discharge Destination: Home Transportation: Private Auto Accompanied By: self Schedule Follow-up Appointment: Yes Clinical Summary of Care: Patient Declined Electronic Signature(s) Signed: 10/18/2020 4:32:50 PM By: Carlene Coria RN Entered By: Carlene Coria on 10/16/2020 16:40:26 [place], [place] ([id number]) -------------------------------------------------------------------------------- Lower Extremity Assessment Details Patient Name: [name] Date of Service: 10/16/2020 3:30 PM Medical Record Number: [id number] Patient Account Number: [id number] Date of Birth/Sex: 01-30-1966 (55 y.o. F) Treating RN: Carlene Coria Primary Care Twania Bujak: Laurance Flatten Other Clinician: Referring Payal Stanforth: Laurance Flatten Treating Aren Pryde/Extender: Yaakov Guthrie in Treatment: 2 Edema Assessment Assessed: [Left: No] [Right: No] Edema: [Left: Ye] [Right: s] Calf Left: Right: Point of Measurement: 37 cm From Medial Instep 48 cm Ankle Left: Right: Point of Measurement: 9 cm From Medial Instep 27 cm Vascular Assessment Pulses: Dorsalis Pedis Palpable: [Left:Yes] Electronic Signature(s) Signed: 10/18/2020 4:32:50 PM By: Carlene Coria RN Entered By: Carlene Coria on 10/16/2020 16:27:53 Steury, Tonika  ([id number]) -------------------------------------------------------------------------------- Multi Wound Chart Details Patient Name: [name] Date of Service: 10/16/2020 3:30 PM Medical Record Number: [id number] Patient Account Number: [id number] Date of Birth/Sex: 1965/12/30 (55 y.o. F) Treating RN: Carlene Coria Primary Care Sunaina Ferrando: Laurance Flatten Other Clinician: Referring Rafael Quesada: Laurance Flatten Treating Everrett Lacasse/Extender: Yaakov Guthrie in Treatment: 2 Vital Signs Height(in): Pulse(bpm): 36 Weight(lbs): Blood Pressure(mmHg): 123/57 Body Mass Index(BMI): Temperature(F): 98.2 Respiratory Rate(breaths/min): 18 Photos: [8:No Photos] [N/A:N/A] Wound Location: Left, Anterior Lower Leg Left, Posterior Lower Leg N/A Wounding Event: Gradually Appeared Gradually Appeared N/A Primary Etiology: Diabetic Wound/Ulcer of the Lower Diabetic Wound/Ulcer of the Lower N/A Extremity Extremity Comorbid History: Congestive Heart Failure, Coronary Congestive Heart Failure, Coronary N/A Artery Disease, Hypertension, Artery Disease, Hypertension, Myocardial Infarction, Peripheral Myocardial Infarction, Peripheral Venous Disease, Type II Diabetes, Venous Disease, Type II Diabetes, End Stage Renal Disease, End Stage Renal Disease, Neuropathy Neuropathy Date  Acquired: 09/20/2020 09/20/2020 N/A Weeks of Treatment: 2 2 N/A Wound Status: Open Open N/A Measurements L x W x D (cm) 10x5x0.1 2x4x0.1 N/A Area (cm) : 39.27 6.283 N/A Volume (cm) : 3.927 0.628 N/A % Reduction in Area: -13.60% -113.30% N/A % Reduction in Volume: -13.60% -112.90% N/A Classification: Grade 2 Grade 2 N/A Exudate Amount: Medium Medium N/A Exudate Type: Serosanguineous Serosanguineous N/A Exudate Color: red, brown red, brown N/A Granulation Amount: Medium (34-66%) Small (1-33%) N/A Granulation Quality: N/A Pink N/A Necrotic Amount: Medium (34-66%) Large (67-100%) N/A Exposed Structures: Fat Layer (Subcutaneous  Tissue): Fat Layer (Subcutaneous Tissue): N/A Yes Yes Fascia: No Fascia: No Tendon: No Tendon: No Muscle: No Muscle: No Joint: No Joint: No Bone: No Bone: No Epithelialization: None None N/A Procedures Performed: Compression Therapy Compression Therapy N/A Treatment Notes Wound #7 (Lower Leg) Wound Laterality: Left, Anterior Cleanser Soap and Water Humes, Clema ([id number]) Discharge Instruction: Gently cleanse wound with antibacterial soap, rinse and pat dry prior to dressing wounds Peri-Wound Care Topical Primary Dressing Silvercel 4 1/4x 4 1/4 (in/in) Discharge Instruction: Apply Silvercel 4 1/4x 4 1/4 (in/in) as instructed Secondary Dressing ABD Pad 5x9 (in/in) Discharge Instruction: Cover with ABD pad Secured With Compression Wrap Profore Lite LF 3 Multilayer Compression Bandaging System Discharge Instruction: Apply 3 multi-layer wrap as prescribed. Compression Stockings Add-Ons Wound #8 (Lower Leg) Wound Laterality: Left, Posterior Cleanser Soap and Water Discharge Instruction: Gently cleanse wound with antibacterial soap, rinse and pat dry prior to dressing wounds Peri-Wound Care Topical Primary Dressing Silvercel 4 1/4x 4 1/4 (in/in) Discharge Instruction: Apply Silvercel 4 1/4x 4 1/4 (in/in) as instructed Secondary Dressing ABD Pad 5x9 (in/in) Discharge Instruction: Cover with ABD pad Secured With Compression Wrap Profore Lite LF 3 Multilayer Compression Bandaging System Discharge Instruction: Apply 3 multi-layer wrap as prescribed. Compression Stockings Circaid Juxta Lite Compression Wrap Quantity: 1 Left Leg Compression Amount: 30-40 mmHg Discharge Instruction: Apply Circaid Juxta Lite Compression Wrap as directed Add-Ons Electronic Signature(s) Signed: 10/16/2020 4:59:51 PM By: Kalman Shan DO Entered By: Kalman Shan on 10/16/2020 16:56:13 Lynwood, [place]  ([id number]) -------------------------------------------------------------------------------- Multi-Disciplinary Care Plan Details Patient Name: [name] Date of Service: 10/16/2020 3:30 PM Medical Record Number: [id number] Patient Account Number: [id number] Date of Birth/Sex: 1965/09/12 (55 y.o. F) Treating RN: Carlene Coria Primary Care Abygale Karpf: Laurance Flatten Other Clinician: Referring Brittan Butterbaugh: Laurance Flatten Treating [place] Ferch/Extender: Yaakov Guthrie in Treatment: 2 Active Inactive Abuse / Safety / Falls / Self Care Management Nursing Diagnoses: Potential for injury related to falls Goals: Patient will remain injury free related to falls Date Initiated: 09/30/2020 Target Resolution Date: 10/31/2020 Goal Status: Active Interventions: Assess Activities of Daily Living upon admission and as needed Assess fall risk on admission and as needed Assess: immobility, friction, shearing, incontinence upon admission and as needed Assess impairment of mobility on admission and as needed per policy Assess personal safety and home safety (as indicated) on admission and as needed Assess self care needs on admission and as needed Notes: Medication Nursing Diagnoses: Knowledge deficit related to medication safety: actual or potential Goals: Patient/caregiver will demonstrate understanding of all current medications Date Initiated: 09/30/2020 Target Resolution Date: 10/31/2020 Goal Status: Active Interventions: Assess for medication contraindications each visit where new medications are prescribed Assess patient/caregiver ability to manage medication regimen upon admission and as needed Notes: Nutrition Nursing Diagnoses: Potential for alteratiion in Nutrition/Potential for imbalanced nutrition Goals: Patient/caregiver will maintain therapeutic glucose control Date Initiated: 09/30/2020 Target Resolution Date: 10/31/2020 Goal Status: Active Interventions: Assess HgA1c results as  ordered upon admission and as needed Assess patient nutrition upon admission and as needed per policy Notes: Electronic Signature(s) ZYANN, MABRY ([id number]) Signed: 10/18/2020 4:32:50 PM By: Carlene Coria RN Entered By: Carlene Coria on 10/16/2020 [place], [place] ([id number]) -------------------------------------------------------------------------------- Pain Assessment Details Patient Name: [name] Date of Service: 10/16/2020 3:30 PM Medical Record Number: [id number] Patient Account Number: [id number] Date of Birth/Sex: [birth year] (55 y.o. F) Treating RN: Carlene Coria Primary Care Reza Crymes: Laurance Flatten Other Clinician: Referring Adamae Ricklefs: Laurance Flatten Treating Micheale Schlack/Extender: Yaakov Guthrie in Treatment: 2 Active Problems Location of Pain Severity and Description of Pain Patient Has Paino No Site Locations Pain Management and Medication Current Pain Management: Electronic Signature(s) Signed: 10/18/2020 4:32:50 PM By: Carlene Coria RN Entered By: Carlene Coria on 10/16/2020 16:25:16 Grieves, [name] ([id number]) -------------------------------------------------------------------------------- Patient/Caregiver Education Details Patient Name: [name] Date of Service: 10/16/2020 3:30 PM Medical Record Number: [id number] Patient Account Number: [id number] Date of Birth/Gender: October 05, 1965 (55 y.o. F) Treating RN: Carlene Coria Primary Care Physician: Laurance Flatten Other Clinician: Referring Physician: Laurance Flatten Treating Physician/Extender: Yaakov Guthrie in Treatment: 2 Education Assessment Education Provided To: Patient Education Topics Provided Wound/Skin Impairment: Methods: Explain/Verbal Responses: State content correctly Electronic Signature(s) Signed: 10/18/2020 4:32:50 PM By: Carlene Coria RN Entered By: Carlene Coria on 10/16/2020 16:39:48 Truszkowski, Luvada  ([id number]) -------------------------------------------------------------------------------- Wound Assessment Details Patient Name: [name] Date of Service: 10/16/2020 3:30 PM Medical Record Number: [id number] Patient Account Number: [id number] Date of Birth/Sex: 27-Oct-1965 (55 y.o. F) Treating RN: Carlene Coria Primary Care Aliciana Ricciardi: Laurance Flatten Other Clinician: Referring Artyom Stencel: Laurance Flatten Treating Waynesha Rammel/Extender: Yaakov Guthrie in Treatment: 2 Wound Status Wound Number: 7 Primary Diabetic Wound/Ulcer of the Lower Extremity Etiology: Wound Location: Left, Anterior Lower Leg Wound Open Wounding Event: Gradually Appeared Status: Date Acquired: 09/20/2020 Comorbid Congestive Heart Failure, Coronary Artery Disease, Weeks Of Treatment: 2 History: Hypertension, Myocardial Infarction, Peripheral Venous Clustered Wound: No Disease, Type II Diabetes, End Stage Renal Disease, Neuropathy Photos Wound Measurements Length: (cm) 10 Width: (cm) 5 Depth: (cm) 0.1 Area: (cm) 39.27 Volume: (cm) 3.927 % Reduction in Area: -13.6% % Reduction in Volume: -13.6% Epithelialization: None Tunneling: No Undermining: No Wound Description Classification: Grade 2 Exudate Amount: Medium Exudate Type: Serosanguineous Exudate Color: red, brown Foul Odor After Cleansing: No Wound Bed Granulation Amount: Medium (34-66%) Exposed Structure Necrotic Amount: Medium (34-66%) Fascia Exposed: No Necrotic Quality: Adherent Slough Fat Layer (Subcutaneous Tissue) Exposed: Yes Tendon Exposed: No Muscle Exposed: No Joint Exposed: No Bone Exposed: No Treatment Notes Wound #7 (Lower Leg) Wound Laterality: Left, Anterior Cleanser Soap and Water Discharge Instruction: Gently cleanse wound with antibacterial soap, rinse and pat dry prior to dressing wounds Peri-Wound Care Antonetti, Gretel ([id number]) Topical Primary Dressing Silvercel 4 1/4x 4 1/4 (in/in) Discharge Instruction: Apply  Silvercel 4 1/4x 4 1/4 (in/in) as instructed Secondary Dressing ABD Pad 5x9 (in/in) Discharge Instruction: Cover with ABD pad Secured With Compression Wrap Profore Lite LF 3 Multilayer Compression Bandaging System Discharge Instruction: Apply 3 multi-layer wrap as prescribed. Compression Stockings Add-Ons Electronic Signature(s) Signed: 10/18/2020 4:32:50 PM By: Carlene Coria RN Entered By: Carlene Coria on 10/16/2020 16:26:50 Martinezlopez, Ouida ([id number]) -------------------------------------------------------------------------------- Wound Assessment Details Patient Name: [name] Date of Service: 10/16/2020 3:30 PM Medical Record Number: [id number] Patient Account Number: [id number] Date of Birth/Sex: [birth year] (56 y.o. F) Treating RN: Carlene Coria Primary Care Aaylah Pokorny: Laurance Flatten Other Clinician: Referring Alixandrea Milleson: Laurance Flatten Treating Odessa Morren/Extender: Yaakov Guthrie in Treatment: 2 Wound Status Wound Number: 8 Primary Diabetic Wound/Ulcer of the Lower  Extremity Etiology: Wound Location: Left, Posterior Lower Leg Wound Open Wounding Event: Gradually Appeared Status: Date Acquired: 09/20/2020 Comorbid Congestive Heart Failure, Coronary Artery Disease, Weeks Of Treatment: 2 History: Hypertension, Myocardial Infarction, Peripheral Venous Clustered Wound: No Disease, Type II Diabetes, End Stage Renal Disease, Neuropathy Wound Measurements Length: (cm) 2 Width: (cm) 4 Depth: (cm) 0.1 Area: (cm) 6.283 Volume: (cm) 0.628 % Reduction in Area: -113.3% % Reduction in Volume: -112.9% Epithelialization: None Tunneling: No Undermining: No Wound Description Classification: Grade 2 Exudate Amount: Medium Exudate Type: Serosanguineous Exudate Color: red, brown Foul Odor After Cleansing: No Slough/Fibrino Yes Wound Bed Granulation Amount: Small (1-33%) Exposed Structure Granulation Quality: Pink Fascia Exposed: No Necrotic Amount: Large (67-100%) Fat Layer  (Subcutaneous Tissue) Exposed: Yes Necrotic Quality: Adherent Slough Tendon Exposed: No Muscle Exposed: No Joint Exposed: No Bone Exposed: No Treatment Notes Wound #8 (Lower Leg) Wound Laterality: Left, Posterior Cleanser Soap and Water Discharge Instruction: Gently cleanse wound with antibacterial soap, rinse and pat dry prior to dressing wounds Peri-Wound Care Topical Primary Dressing Silvercel 4 1/4x 4 1/4 (in/in) Discharge Instruction: Apply Silvercel 4 1/4x 4 1/4 (in/in) as instructed Secondary Dressing ABD Pad 5x9 (in/in) Discharge Instruction: Cover with ABD pad Secured With Compression Wrap Profore Lite LF 3 Multilayer Compression [place] Santaella, Yocelin ([id number]) Discharge Instruction: Apply 3 multi-layer wrap as prescribed. Compression Stockings Circaid Juxta Lite Compression Wrap Quantity: 1 Left Leg Compression Amount: 30-40 mmHg Discharge Instruction: Apply Circaid Juxta Lite Compression Wrap as directed Add-Ons Electronic Signature(s) Signed: 10/18/2020 4:32:50 PM By: Carlene Coria RN Entered By: Carlene Coria on 10/16/2020 16:27:08 Morello, Julita ([id number]) -------------------------------------------------------------------------------- Vitals Details Patient Name: [name] Date of Service: 10/16/2020 3:30 PM Medical Record Number: [id number] Patient Account Number: [id number] Date of Birth/Sex: 06/04/1965 (55 y.o. F) Treating RN: Carlene Coria Primary Care Cato Liburd: Laurance Flatten Other Clinician: Referring Fani Rotondo: Laurance Flatten Treating Anvith Mauriello/Extender: Yaakov Guthrie in Treatment: 2 Vital Signs Time Taken: 16:24 Temperature (F): 98.2 Pulse (bpm): 87 Respiratory Rate (breaths/min): 18 Blood Pressure (mmHg): 123/57 Reference Range: 80 - 120 mg / dl Electronic Signature(s) Signed: 10/18/2020 4:32:50 PM By: Carlene Coria RN Entered By: Carlene Coria on 10/16/2020 16:25:02

## 2020-10-21 ENCOUNTER — Other Ambulatory Visit: Payer: Self-pay

## 2020-10-21 ENCOUNTER — Encounter: Payer: Medicare Other | Attending: Physician Assistant | Admitting: Physician Assistant

## 2020-10-21 DIAGNOSIS — I5042 Chronic combined systolic (congestive) and diastolic (congestive) heart failure: Secondary | ICD-10-CM | POA: Diagnosis not present

## 2020-10-21 DIAGNOSIS — I872 Venous insufficiency (chronic) (peripheral): Secondary | ICD-10-CM | POA: Diagnosis not present

## 2020-10-21 DIAGNOSIS — I2510 Atherosclerotic heart disease of native coronary artery without angina pectoris: Secondary | ICD-10-CM | POA: Insufficient documentation

## 2020-10-21 DIAGNOSIS — E1140 Type 2 diabetes mellitus with diabetic neuropathy, unspecified: Secondary | ICD-10-CM | POA: Insufficient documentation

## 2020-10-21 DIAGNOSIS — E1122 Type 2 diabetes mellitus with diabetic chronic kidney disease: Secondary | ICD-10-CM | POA: Insufficient documentation

## 2020-10-21 DIAGNOSIS — I132 Hypertensive heart and chronic kidney disease with heart failure and with stage 5 chronic kidney disease, or end stage renal disease: Secondary | ICD-10-CM | POA: Diagnosis not present

## 2020-10-21 DIAGNOSIS — Z794 Long term (current) use of insulin: Secondary | ICD-10-CM | POA: Insufficient documentation

## 2020-10-21 DIAGNOSIS — E11622 Type 2 diabetes mellitus with other skin ulcer: Secondary | ICD-10-CM | POA: Diagnosis not present

## 2020-10-21 DIAGNOSIS — I890 Lymphedema, not elsewhere classified: Secondary | ICD-10-CM | POA: Insufficient documentation

## 2020-10-21 DIAGNOSIS — N186 End stage renal disease: Secondary | ICD-10-CM | POA: Insufficient documentation

## 2020-10-21 DIAGNOSIS — L97822 Non-pressure chronic ulcer of other part of left lower leg with fat layer exposed: Secondary | ICD-10-CM | POA: Insufficient documentation

## 2020-10-21 DIAGNOSIS — I252 Old myocardial infarction: Secondary | ICD-10-CM | POA: Diagnosis not present

## 2020-10-21 NOTE — Progress Notes (Signed)
AVAYAH, RAFFETY ([id number]) Visit Report for 10/21/2020 Chief Complaint Document Details Patient Name: [name], [name] Date of Service: 10/21/2020 2:30 PM Medical Record Number: [id number] Patient Account Number: [id number] Date of Birth/Sex: 09-11-1965 (55 y.o. F) Treating RN: Carlene Coria Primary Care Provider: Laurance Flatten Other Clinician: Referring Provider: Laurance Flatten Treating Provider/Extender: Skipper Cliche in Treatment: 3 Information Obtained from: Patient Chief Complaint Left LE Ulcer Electronic Signature(s) Signed: 10/21/2020 3:01:32 PM By: Worthy Keeler PA-C Entered By: Worthy Keeler on 10/21/2020 15:01:32 [place], [name] ([id number]) -------------------------------------------------------------------------------- HPI Details Patient Name: [name] Date of Service: 10/21/2020 2:30 PM Medical Record Number: [id number] Patient Account Number: [id number] Date of Birth/Sex: 01/24/1966 (55 y.o. F) Treating RN: Carlene Coria Primary Care Provider: Laurance Flatten Other Clinician: Referring Provider: Laurance Flatten Treating Provider/Extender: Skipper Cliche in Treatment: 3 History of Present Illness HPI Description: 10/24/2018 on evaluation today patient presents for initial evaluation or clinic concerning issues that she has been having with lymphedema for quite some time. Unfortunately she has several wound openings at this point that secondary to her lymphedema/venous stasis are giving her trouble and leaking quite severely. She also has diabetes along with hypertension and stage III chronic kidney disease. Fortunately there is no signs of active infection at this time. She is going to likely require debridement of the left leg ulcer upon evaluation today just based on what I am seeing. Fortunately there is no wound opening on the right. Overall the patient seems to be doing quite well and again there is no evidence of systemic infection which is good news. No fevers, chills,  nausea, vomiting, or diarrhea. 10/31/2018 on evaluation today patient actually appears to be doing excellent in regard to her lower extremity ulcers. She has been tolerating the dressing changes without complication. Fortunately there is no signs of active infection at this time. She has tolerated 3 layer compression wrap without complication. 11/07/2018 upon evaluation today patient actually appears to be doing very well with regard to her left lower extremity ulcers. She has been tolerating the dressing changes without complication. Fortunately there is no signs of active infection. No fevers, chills, nausea, vomiting, or diarrhea. 11/21/2018 upon evaluation today patient appears to be doing quite well with regard to her lower extremity ulcers. In fact both areas seem to be showing signs of good improvement which is excellent. She is not having as much pain as she has in the past and again has a lot of healing compared to previous visits as well. 12/05/2018 on evaluation today patient presents for follow-up concerning her ongoing issues with her bilateral lower extremity ulcers. The good news is her right lower extremity is showing signs of completely healing at this time which is great news. Fortunately there is no evidence of active infection. On the left she has just a very small area still remaining that is open at this time all in all she is very close to complete closure in my opinion. She does have compression stockings to wear at home. 12/12/2018 on evaluation today patient appears to be doing excellent in regard to her left lateral lower extremity ulcer. She has been tolerating the dressing changes without complication. Fortunately there is no signs of active infection at this time. In fact this appears to be pretty much healed at this point although again she is not 100% today. No fevers, chills, nausea, vomiting, or diarrhea. 12/19/2018 on evaluation today patient actually appears to be doing  excellent with regard to her lower extremity ulcer  in fact this appears to be completely healed today which is all some. She has done extremely well with wound care measures. No fevers, chills, nausea, vomiting, or diarrhea. ------------------------------------ 11/01/19-Readmission to the clinic Patient presents with left leg pain, wounds posterior calf, onset about 4 weeks, denies any fevers chills or shakes, has not been using anything to these wounds, was given compression stockings at last discharge from the clinic but has not been able to use them as they rolled down and cause creases Patient's history significant for type 2 diabetes insulin requiring, A1c of 6.9 lately, hypertension, chronic pain 8/18; patient readmitted that the clinic last week. She has wounds on her left lateral posterior lower leg all of this in close juxtaposition i.e. a localized site. We've been using silver alginate under 3 layer compression apparently the wound surface area is better. She was wearing compression stockings from elastic therapy but says they were falling down. As she progresses more towards healing will need to address what we use in terms of compression stockings perhaps external compression garments 11/17/2019 on evaluation today patient appears to be doing well with regard to her legs currently. She does have 2 wounds which are actually measuring much better the more posterior is actually doing very well which I am pleased with the other though smaller is not quite a small but nonetheless on the lateral aspect does seem to be improving. 11/23/2019 on evaluation today patient appears to be doing well in regard to her wounds. In fact the wound on the posterior aspect of her leg appears healed the lateral aspect is still open but extremely small. 9/15; this is a patient with chronic venous insufficiency and secondary lymphedema. She did not keep her clinic appointment last week and hence the left leg is a  lot more swollen since she had to take the wrap off at some point. She has a small weeping area on the left lateral lower leg. She has probably a juxta lite stocking for the left leg I have asked her to bring that in when she comes to her clinic appointment next week at which time she should be healed 10/6; this is a patient with chronic venous insufficiency and secondary lymphedema left greater than right. Skin changes of chronic lymphedema especially in the left leg. She has not been here in 3 weeks. She took the wrap off 2 weeks ago. She has some very old 20/30 stockings from elastic therapy in [place] she has been using. Fortunately her leg is closed. She also has a single Farrow wrap for the left leg Readmission: 06/18/2020 upon evaluation today patient presents for reevaluation here in the clinic. She is a long-term patient that we have seen intermittently when she has had flareups of issues with her lower extremity secondary to chronic venous stasis. With that being said unfortunately today she is having a significant issue with a wound which is actually quite significant over the right lower extremity. She did see dermatology they put Vaseline followed by Telfa island dressings on her unfortunately she is continuing to have significant issues however here with pain and in fact this wrap was extremely stuck to her upon evaluation today. Nonetheless I believe that she is going to require a little bit different approach to try to keep things from sticking and hopefully aid in getting this to heal more effectively and quickly. With that being said she does have a history of Schwegel, Tabitha ([id number]) chronic venous insufficiency, lymphedema, diabetes mellitus type 2,  chronic kidney disease stage III, hypertension, and congestive heart failure. This started as an injury in mid February and has worsened over the past 2 weeks. 07/04/2020 upon evaluation today patient appears to be doing well with  regard to her wound on the leg. Overall I am extremely pleased with where things stand and I do think that she is making excellent progress. There is no sign of infection right now which is also great news. I think that she is close to complete resolution. 07/09/20 upon evaluation today patient appears to be doing excellent in regard to her leg ulcer. She has been tolerating the dressing changes without complication and in general I am extremely pleased with where things stand today. There does not appear to be any signs of active infection at this time which is great news. 07/25/2020 upon evaluation today patient appears to be doing well with regard to her wound. She has been tolerating the dressing changes without complication. Fortunately there is no signs of active infection at this time and her wound appears to be completely healed. Readmission: 09/30/2020 upon evaluation today patient appears for reevaluation here in our clinic. She was discharged May 5 that unfortunately has started to have weeping and wounds on the left leg at this point. She tells me the right leg did weep some but nothing as significant as the left. With all that being said at this point the patient states that she figured it was best to come in here and she was having a lot of drainage and it was get all of her sheets and everything. She has been wearing her Velcro wraps sometimes but it does not sound like she is been wearing them all the time which I think is part of the reason why this reopened. She also unfortunately had a fall which was quite significant. This occurred I believe about a week or 2 ago. Otherwise patient's past medical history really has not changed significantly. 10/07/2020 upon evaluation today patient appears to be doing better in regard to her wounds of the lower extremities. Fortunately there does not appear to be any signs of active infection at this time which is great news. I do not see any evidence  of anything worsening but again she continues to have significant bilateral stage III lymphedema. This has been an ongoing issue. We actually have seen her this year pretty much from March through May of 2022. Following this she continued with compression at home following but then unfortunately reopened again. That was on July 11. With that being said this is an ongoing and recurrent issue for which honestly have seen her over the past 2 years pretty much consistently. Nonetheless we have had Velcro compression wraps that she uses at home and despite this she continues to have frequent and recurrent ulcerations. 7/27; patient presents for 1 week follow-up. She has no issues or complaints today. She denies signs of infection. 10/21/2020 upon evaluation today patient's leg actually appears to be doing decently well on the left there is nothing open on the right at all and I am pleased in that regard. On the left side I do think that she still has some areas of weeping I believe the compression wrap is still the best way to go although I am hopeful that we are headed in the right direction as far as getting this area to heal and silver. Upon inspection patient's wound bed actually showed signs of good granulation epithelization at this point. There does  not appear to be any evidence of active infection which is great news and overall I am extremely pleased with where things stand. Electronic Signature(s) Signed: 10/21/2020 3:55:50 PM By: Worthy Keeler PA-C Entered By: Worthy Keeler on 10/21/2020 15:55:49 Linz, [name] ([id number]) -------------------------------------------------------------------------------- Physical Exam Details Patient Name: [name] Date of Service: 10/21/2020 2:30 PM Medical Record Number: [id number] Patient Account Number: [id number] Date of Birth/Sex: 1965/06/09 (55 y.o. F) Treating RN: Carlene Coria Primary Care Provider: Laurance Flatten Other Clinician: Referring  Provider: Laurance Flatten Treating Provider/Extender: Jeri Cos Weeks in Treatment: 3 Constitutional Obese and well-hydrated in no acute distress. Respiratory normal breathing without difficulty. Psychiatric this patient is able to make decisions and demonstrates good insight into disease process. Alert and Oriented x 3. pleasant and cooperative. Notes Patient's wound bed actually showed signs again of having areas of weeping in regard to the left leg again were not completely healed but this does appear to be doing quite well. Electronic Signature(s) Signed: 10/21/2020 3:56:30 PM By: Worthy Keeler PA-C Entered By: Worthy Keeler on 10/21/2020 15:56:30 Muhammed, [name] ([id number]) -------------------------------------------------------------------------------- Physician Orders Details Patient Name: [name] Date of Service: 10/21/2020 2:30 PM Medical Record Number: [id number] Patient Account Number: [id number] Date of Birth/Sex: 07/12/1965 (55 y.o. F) Treating RN: Carlene Coria Primary Care Provider: Laurance Flatten Other Clinician: Referring Provider: Laurance Flatten Treating Provider/Extender: Skipper Cliche in Treatment: 3 Verbal / Phone Orders: No Diagnosis Coding ICD-10 Coding Code Description I87.2 Venous insufficiency (chronic) (peripheral) I89.0 Lymphedema, not elsewhere classified L97.822 Non-pressure chronic ulcer of other part of left lower leg with fat layer exposed E11.622 Type 2 diabetes mellitus with other skin ulcer N18.30 Chronic kidney disease, stage 3 unspecified I10 Essential (primary) hypertension I50.42 Chronic combined systolic (congestive) and diastolic (congestive) heart failure Follow-up Appointments o Return Appointment in 1 week. Bathing/ Shower/ Hygiene o May shower with wound dressing protected with water repellent cover or cast protector. Edema Control - Lymphedema / Segmental Compressive Device / Other o Optional: One layer of unna paste to  top of compression wrap (to act as an anchor). o Patient to wear own Velcro compression garment. Remove compression stockings every night before going to bed and put on every morning when getting up. - right lower leg o Elevate, Exercise Daily and Avoid Standing for Long Periods of Time. o Elevate legs to the level of the heart and pump ankles as often as possible o Elevate leg(s) parallel to the floor when sitting. o Compression Pump: Use compression pump on left lower extremity for 60 minutes, twice daily. o Compression Pump: Use compression pump on right lower extremity for 60 minutes, twice daily. Wound Treatment Wound #7 - Lower Leg Wound Laterality: Left, Anterior Cleanser: Soap and Water 1 x Per Week/30 Days Discharge Instructions: Gently cleanse wound with antibacterial soap, rinse and pat dry prior to dressing wounds Primary Dressing: Silvercel 4 1/4x 4 1/4 (in/in) 1 x Per Week/30 Days Discharge Instructions: Apply Silvercel 4 1/4x 4 1/4 (in/in) as instructed Secondary Dressing: ABD Pad 5x9 (in/in) 1 x Per Week/30 Days Discharge Instructions: Cover with ABD pad Compression Wrap: Profore Lite LF 3 Multilayer Compression Bandaging System 1 x Per Week/30 Days Discharge Instructions: Apply 3 multi-layer wrap as prescribed. Wound #8 - Lower Leg Wound Laterality: Left, Posterior Cleanser: Soap and Water 1 x Per Week/30 Days Discharge Instructions: Gently cleanse wound with antibacterial soap, rinse and pat dry prior to dressing wounds Primary Dressing: Silvercel 4 1/4x 4 1/4 (in/in) 1 x  Per Week/30 Days Discharge Instructions: Apply Silvercel 4 1/4x 4 1/4 (in/in) as instructed Secondary Dressing: ABD Pad 5x9 (in/in) 1 x Per Week/30 Days Discharge Instructions: Cover with ABD pad Compression Wrap: Profore Lite LF 3 Multilayer Compression Bandaging System 1 x Per Week/30 Days Discharge Instructions: Apply 3 multi-layer wrap as prescribed. Mittleman, Delrose  ([id number]) Compression Stockings: Circaid Juxta Lite Compression Wrap Left Leg Compression Amount: 30-40 mmHG Discharge Instructions: Apply Circaid Juxta Lite Compression Wrap as directed Electronic Signature(s) Signed: 10/21/2020 4:00:23 PM By: Carlene Coria RN Signed: 10/21/2020 4:30:46 PM By: Worthy Keeler PA-C Entered By: Carlene Coria on 10/21/2020 [place], Fredrick ([id number]) -------------------------------------------------------------------------------- Problem List Details Patient Name: [name] Date of Service: 10/21/2020 2:30 PM Medical Record Number: [id number] Patient Account Number: [id number] Date of Birth/Sex: 01-14-1966 (55 y.o. F) Treating RN: Carlene Coria Primary Care Provider: Laurance Flatten Other Clinician: Referring Provider: Laurance Flatten Treating Provider/Extender: Skipper Cliche in Treatment: 3 Active Problems ICD-10 Encounter Code Description Active Date MDM Diagnosis I87.2 Venous insufficiency (chronic) (peripheral) 09/30/2020 No Yes I89.0 Lymphedema, not elsewhere classified 09/30/2020 No Yes L97.822 Non-pressure chronic ulcer of other part of left lower leg with fat layer 09/30/2020 No Yes exposed E11.622 Type 2 diabetes mellitus with other skin ulcer 09/30/2020 No Yes N18.30 Chronic kidney disease, stage 3 unspecified 09/30/2020 No Yes I10 Essential (primary) hypertension 09/30/2020 No Yes I50.42 Chronic combined systolic (congestive) and diastolic (congestive) heart 09/30/2020 No Yes failure Inactive Problems Resolved Problems Electronic Signature(s) Signed: 10/21/2020 3:01:26 PM By: Worthy Keeler PA-C Entered By: Worthy Keeler on 10/21/2020 15:01:26 Castillo, Elias ([id number]) -------------------------------------------------------------------------------- Progress Note Details Patient Name: [name] Date of Service: 10/21/2020 2:30 PM Medical Record Number: [id number] Patient Account Number: [id number] Date of Birth/Sex: [birth year] (55  y.o. F) Treating RN: Carlene Coria Primary Care Provider: Laurance Flatten Other Clinician: Referring Provider: Laurance Flatten Treating Provider/Extender: Skipper Cliche in Treatment: 3 Subjective Chief Complaint Information obtained from Patient Left LE Ulcer History of Present Illness (HPI) 10/24/2018 on evaluation today patient presents for initial evaluation or clinic concerning issues that she has been having with lymphedema for quite some time. Unfortunately she has several wound openings at this point that secondary to her lymphedema/venous stasis are giving her trouble and leaking quite severely. She also has diabetes along with hypertension and stage III chronic kidney disease. Fortunately there is no signs of active infection at this time. She is going to likely require debridement of the left leg ulcer upon evaluation today just based on what I am seeing. Fortunately there is no wound opening on the right. Overall the patient seems to be doing quite well and again there is no evidence of systemic infection which is good news. No fevers, chills, nausea, vomiting, or diarrhea. 10/31/2018 on evaluation today patient actually appears to be doing excellent in regard to her lower extremity ulcers. She has been tolerating the dressing changes without complication. Fortunately there is no signs of active infection at this time. She has tolerated 3 layer compression wrap without complication. 11/07/2018 upon evaluation today patient actually appears to be doing very well with regard to her left lower extremity ulcers. She has been tolerating the dressing changes without complication. Fortunately there is no signs of active infection. No fevers, chills, nausea, vomiting, or diarrhea. 11/21/2018 upon evaluation today patient appears to be doing quite well with regard to her lower extremity ulcers. In fact both areas seem to be showing signs of good improvement which is excellent. She is not  having as  much pain as she has in the past and again has a lot of healing compared to previous visits as well. 12/05/2018 on evaluation today patient presents for follow-up concerning her ongoing issues with her bilateral lower extremity ulcers. The good news is her right lower extremity is showing signs of completely healing at this time which is great news. Fortunately there is no evidence of active infection. On the left she has just a very small area still remaining that is open at this time all in all she is very close to complete closure in my opinion. She does have compression stockings to wear at home. 12/12/2018 on evaluation today patient appears to be doing excellent in regard to her left lateral lower extremity ulcer. She has been tolerating the dressing changes without complication. Fortunately there is no signs of active infection at this time. In fact this appears to be pretty much healed at this point although again she is not 100% today. No fevers, chills, nausea, vomiting, or diarrhea. 12/19/2018 on evaluation today patient actually appears to be doing excellent with regard to her lower extremity ulcer in fact this appears to be completely healed today which is all some. She has done extremely well with wound care measures. No fevers, chills, nausea, vomiting, or diarrhea. ------------------------------------ 11/01/19-Readmission to the clinic Patient presents with left leg pain, wounds posterior calf, onset about 4 weeks, denies any fevers chills or shakes, has not been using anything to these wounds, was given compression stockings at last discharge from the clinic but has not been able to use them as they rolled down and cause creases Patient's history significant for type 2 diabetes insulin requiring, A1c of 6.9 lately, hypertension, chronic pain 8/18; patient readmitted that the clinic last week. She has wounds on her left lateral posterior lower leg all of this in close juxtaposition i.e.  a localized site. We've been using silver alginate under 3 layer compression apparently the wound surface area is better. She was wearing compression stockings from elastic therapy but says they were falling down. As she progresses more towards healing will need to address what we use in terms of compression stockings perhaps external compression garments 11/17/2019 on evaluation today patient appears to be doing well with regard to her legs currently. She does have 2 wounds which are actually measuring much better the more posterior is actually doing very well which I am pleased with the other though smaller is not quite a small but nonetheless on the lateral aspect does seem to be improving. 11/23/2019 on evaluation today patient appears to be doing well in regard to her wounds. In fact the wound on the posterior aspect of her leg appears healed the lateral aspect is still open but extremely small. 9/15; this is a patient with chronic venous insufficiency and secondary lymphedema. She did not keep her clinic appointment last week and hence the left leg is a lot more swollen since she had to take the wrap off at some point. She has a small weeping area on the left lateral lower leg. She has probably a juxta lite stocking for the left leg I have asked her to bring that in when she comes to her clinic appointment next week at which time she should be healed 10/6; this is a patient with chronic venous insufficiency and secondary lymphedema left greater than right. Skin changes of chronic lymphedema especially in the left leg. She has not been here in 3 weeks. She took the  wrap off 2 weeks ago. She has some very old 20/30 stockings from elastic therapy in [place] she has been using. Fortunately her leg is closed. She also has a single Farrow wrap for the left leg Readmission: 06/18/2020 upon evaluation today patient presents for reevaluation here in the clinic. She is a long-term patient that we have seen  intermittently Vansickle, Dallys ([id number]) when she has had flareups of issues with her lower extremity secondary to chronic venous stasis. With that being said unfortunately today she is having a significant issue with a wound which is actually quite significant over the right lower extremity. She did see dermatology they put Vaseline followed by Telfa island dressings on her unfortunately she is continuing to have significant issues however here with pain and in fact this wrap was extremely stuck to her upon evaluation today. Nonetheless I believe that she is going to require a little bit different approach to try to keep things from sticking and hopefully aid in getting this to heal more effectively and quickly. With that being said she does have a history of chronic venous insufficiency, lymphedema, diabetes mellitus type 2, chronic kidney disease stage III, hypertension, and congestive heart failure. This started as an injury in mid February and has worsened over the past 2 weeks. 07/04/2020 upon evaluation today patient appears to be doing well with regard to her wound on the leg. Overall I am extremely pleased with where things stand and I do think that she is making excellent progress. There is no sign of infection right now which is also great news. I think that she is close to complete resolution. 07/09/20 upon evaluation today patient appears to be doing excellent in regard to her leg ulcer. She has been tolerating the dressing changes without complication and in general I am extremely pleased with where things stand today. There does not appear to be any signs of active infection at this time which is great news. 07/25/2020 upon evaluation today patient appears to be doing well with regard to her wound. She has been tolerating the dressing changes without complication. Fortunately there is no signs of active infection at this time and her wound appears to be completely  healed. Readmission: 09/30/2020 upon evaluation today patient appears for reevaluation here in our clinic. She was discharged May 5 that unfortunately has started to have weeping and wounds on the left leg at this point. She tells me the right leg did weep some but nothing as significant as the left. With all that being said at this point the patient states that she figured it was best to come in here and she was having a lot of drainage and it was get all of her sheets and everything. She has been wearing her Velcro wraps sometimes but it does not sound like she is been wearing them all the time which I think is part of the reason why this reopened. She also unfortunately had a fall which was quite significant. This occurred I believe about a week or 2 ago. Otherwise patient's past medical history really has not changed significantly. 10/07/2020 upon evaluation today patient appears to be doing better in regard to her wounds of the lower extremities. Fortunately there does not appear to be any signs of active infection at this time which is great news. I do not see any evidence of anything worsening but again she continues to have significant bilateral stage III lymphedema. This has been an ongoing issue. We actually have seen her  this year pretty much from March through May of 2022. Following this she continued with compression at home following but then unfortunately reopened again. That was on July 11. With that being said this is an ongoing and recurrent issue for which honestly have seen her over the past 2 years pretty much consistently. Nonetheless we have had Velcro compression wraps that she uses at home and despite this she continues to have frequent and recurrent ulcerations. 7/27; patient presents for 1 week follow-up. She has no issues or complaints today. She denies signs of infection. 10/21/2020 upon evaluation today patient's leg actually appears to be doing decently well on the left  there is nothing open on the right at all and I am pleased in that regard. On the left side I do think that she still has some areas of weeping I believe the compression wrap is still the best way to go although I am hopeful that we are headed in the right direction as far as getting this area to heal and silver. Upon inspection patient's wound bed actually showed signs of good granulation epithelization at this point. There does not appear to be any evidence of active infection which is great news and overall I am extremely pleased with where things stand. Objective Constitutional Obese and well-hydrated in no acute distress. Vitals Time Taken: 2:57 PM, Temperature: 98.6 F, Pulse: 79 bpm, Respiratory Rate: 20 breaths/min, Blood Pressure: 123/50 mmHg. Respiratory normal breathing without difficulty. Psychiatric this patient is able to make decisions and demonstrates good insight into disease process. Alert and Oriented x 3. pleasant and cooperative. General Notes: Patient's wound bed actually showed signs again of having areas of weeping in regard to the left leg again were not completely healed but this does appear to be doing quite well. Integumentary (Hair, Skin) Wound #7 status is Open. Original cause of wound was Gradually Appeared. The date acquired was: 09/20/2020. The wound has been in treatment 3 weeks. The wound is located on the Left,Anterior Lower Leg. The wound measures 10cm length x 4cm width x 0.1cm depth; 31.416cm^2 area and 3.142cm^3 volume. There is Fat Layer (Subcutaneous Tissue) exposed. There is no tunneling or undermining noted. There is a medium amount of serosanguineous drainage noted. There is medium (34-66%) granulation within the wound bed. There is a medium (34-66%) amount of necrotic tissue within the wound bed including Adherent Slough. Wound #8 status is Open. Original cause of wound was Gradually Appeared. The date acquired was: 09/20/2020. The wound has been in  treatment Penafiel, Monserrath ([id number]) 3 weeks. The wound is located on the Left,Posterior Lower Leg. The wound measures 2cm length x 4cm width x 0.1cm depth; 6.283cm^2 area and 0.628cm^3 volume. There is Fat Layer (Subcutaneous Tissue) exposed. There is no tunneling or undermining noted. There is a medium amount of serosanguineous drainage noted. There is small (1-33%) pink granulation within the wound bed. There is a large (67-100%) amount of necrotic tissue within the wound bed including Adherent Slough. Assessment Active Problems ICD-10 Venous insufficiency (chronic) (peripheral) Lymphedema, not elsewhere classified Non-pressure chronic ulcer of other part of left lower leg with fat layer exposed Type 2 diabetes mellitus with other skin ulcer Chronic kidney disease, stage 3 unspecified Essential (primary) hypertension Chronic combined systolic (congestive) and diastolic (congestive) heart failure Plan 1. Would recommend that we going continue with the compression wrap for the left leg I think this is the right thing to do currently and the patient is in agreement with the plan. This  includes the use of the silver alginate dressing to the open wound locations. And then subsequently the 4-layer compression wrap. 2. I am also can recommend that the patient use the juxta lite compression wrap on the right leg she did get that in the mail unfortunately does have that ready to go. 3. I am also can recommend the patient continue to elevate her legs much as possible to try to help with edema control. We will see patient back for reevaluation in 1 week here in the clinic. If anything worsens or changes patient will contact our office for additional recommendations. Electronic Signature(s) Signed: 10/21/2020 3:57:16 PM By: Worthy Keeler PA-C Entered By: Worthy Keeler on 10/21/2020 15:57:16 Spratling, [name]  ([id number]) -------------------------------------------------------------------------------- SuperBill Details Patient Name: [name] Date of Service: 10/21/2020 Medical Record Number: [id number] Patient Account Number: [id number] Date of Birth/Sex: [birth year] (55 y.o. F) Treating RN: Carlene Coria Primary Care Provider: Laurance Flatten Other Clinician: Referring Provider: Laurance Flatten Treating Provider/Extender: Skipper Cliche in Treatment: 3 Diagnosis Coding ICD-10 Codes Code Description I87.2 Venous insufficiency (chronic) (peripheral) I89.0 Lymphedema, not elsewhere classified L97.822 Non-pressure chronic ulcer of other part of left lower leg with fat layer exposed E11.622 Type 2 diabetes mellitus with other skin ulcer N18.30 Chronic kidney disease, stage 3 unspecified I10 Essential (primary) hypertension I50.42 Chronic combined systolic (congestive) and diastolic (congestive) heart failure Facility Procedures CPT4 Code: 03159458 Description: (Facility Use Only) [phone] - [facility] LT LEG Modifier: Quantity: 1 Physician Procedures CPT4 Code: [id number] Description: 99214 - WC PHYS LEVEL 4 - EST PT Modifier: Quantity: 1 CPT4 Code: Description: ICD-10 Diagnosis Description I87.2 Venous insufficiency (chronic) (peripheral) I89.0 Lymphedema, not elsewhere classified E11.622 Type 2 diabetes mellitus with other skin ulcer L97.822 Non-pressure chronic ulcer of other part of left lower  leg with fat lay Modifier: er exposed Quantity: Electronic Signature(s) Signed: 10/21/2020 4:01:13 PM By: Carlene Coria RN Signed: 10/21/2020 4:30:46 PM By: Worthy Keeler PA-C Previous Signature: 10/21/2020 3:57:53 PM Version By: Worthy Keeler PA-C Entered By: Carlene Coria on 10/21/2020 16:01:13

## 2020-10-23 NOTE — Progress Notes (Signed)
[name], [name] ([id number]) Visit Report for 10/21/2020 Arrival Information Details Patient Name: [name], [name] Date of Service: 10/21/2020 2:30 PM Medical Record Number: [id number] Patient Account Number: [id number] Date of Birth/Sex: [birth year] (55 y.o. F) Treating RN: [name] Primary Care [name]: [name] Other Clinician: Referring [name]: [name] Treating Taygen Newsome/Extender: [name] in Treatment: 3 Visit Information History Since Last Visit All ordered tests and consults were completed: No Patient Arrived: Ambulatory Added or deleted any medications: No Arrival Time: 14:47 Any new allergies or adverse reactions: No Accompanied By: self Had a fall or experienced change in No Transfer Assistance: None activities of daily living that may affect Patient Identification Verified: Yes risk of falls: Secondary Verification Process Completed: Yes Signs or symptoms of abuse/neglect since last visito No Patient Requires Transmission-Based Precautions: No Hospitalized since last visit: No Patient Has Alerts: No Implantable device outside of the clinic excluding No cellular tissue based products placed in the center since last visit: Has Dressing in Place as Prescribed: Yes Has Compression in Place as Prescribed: Yes Pain Present Now: No Electronic Signature(s) Signed: 10/23/2020 2:51:00 PM By: [name] Coria RN Entered By: [name] on 10/21/2020 14:57:28 [name], [name] ([id number]) -------------------------------------------------------------------------------- Clinic Level of Care Assessment Details Patient Name: [name], [name] Date of Service: 10/21/2020 2:30 PM Medical Record Number: [id number] Patient Account Number: [id number] Date of Birth/Sex: [birth year] (55 y.o. F) Treating RN: [name] Primary Care Freedom Peddy: [name] Other Clinician: Referring [name]: [name] Treating [name]/Extender: [name] in Treatment: 3 Clinic Level of  Care Assessment Items TOOL 1 Quantity Score []  - Use when EandM and Procedure is performed on INITIAL visit 0 ASSESSMENTS - Nursing Assessment / Reassessment []  - General Physical Exam (combine w/ comprehensive assessment (listed just below) when performed on new 0 pt. evals) []  - 0 Comprehensive Assessment (HX, ROS, Risk Assessments, Wounds Hx, etc.) ASSESSMENTS - Wound and Skin Assessment / Reassessment []  - Dermatologic / Skin Assessment (not related to wound area) 0 ASSESSMENTS - Ostomy and/or Continence Assessment and Care []  - Incontinence Assessment and Management 0 []  - 0 Ostomy Care Assessment and Management (repouching, etc.) PROCESS - Coordination of Care []  - Simple Patient / Family Education for ongoing care 0 []  - 0 Complex (extensive) Patient / Family Education for ongoing care []  - 0 Staff obtains Programmer, systems, Records, Test Results / Process Orders []  - 0 Staff telephones HHA, Nursing Homes / Clarify orders / etc []  - 0 Routine Transfer to another Facility (non-emergent condition) []  - 0 Routine Hospital Admission (non-emergent condition) []  - 0 New Admissions / Biomedical engineer / Ordering NPWT, Apligraf, etc. []  - 0 Emergency Hospital Admission (emergent condition) PROCESS - Special Needs []  - Pediatric / Minor Patient Management 0 []  - 0 Isolation Patient Management []  - 0 Hearing / Language / Visual special needs []  - 0 Assessment of Community assistance (transportation, D/C planning, etc.) []  - 0 Additional assistance / Altered mentation []  - 0 Support Surface(s) Assessment (bed, cushion, seat, etc.) INTERVENTIONS - Miscellaneous []  - External ear exam 0 []  - 0 Patient Transfer (multiple staff / Civil Service fast streamer / Similar devices) []  - 0 Simple Staple / Suture removal (25 or less) []  - 0 Complex Staple / Suture removal (26 or more) []  - 0 Hypo/Hyperglycemic Management (do not check if billed separately) []  - 0 Ankle / Brachial Index (ABI) - do  not check if billed separately Has the patient been seen at the hospital within the last three years: Yes Total  Score: 0 Level Of Care: ____ [name] ([id number]) Electronic Signature(s) Signed: 10/23/2020 2:51:00 PM By: [name] Coria RN Entered By: [name] on 10/21/2020 16:00:58 [name], [name] ([id number]) -------------------------------------------------------------------------------- Compression Therapy Details Patient Name: [name] Date of Service: 10/21/2020 2:30 PM Medical Record Number: [id number] Patient Account Number: [id number] Date of Birth/Sex: May 07, 1965 (55 y.o. F) Treating RN: [name] Primary Care Blakelyn Dinges: [name] Other Clinician: Referring [name]: [name] Treating [name]/Extender: [name] in Treatment: 3 Compression Therapy Performed for Wound Assessment: Wound #7 Left,Anterior Lower Leg Performed By: Clinician [name] Coria, RN Compression Type: Three Layer Post Procedure Diagnosis Same as Pre-procedure Electronic Signature(s) Signed: 10/21/2020 3:58:41 PM By: [name] Coria RN Entered By: [name] on 10/21/2020 15:58:40 [name], [name] ([id number]) -------------------------------------------------------------------------------- Compression Therapy Details Patient Name: [name] Date of Service: 10/21/2020 2:30 PM Medical Record Number: [id number] Patient Account Number: [id number] Date of Birth/Sex: 04/27/1965 (55 y.o. F) Treating RN: [name] Primary Care Candid Bovey: [name] Other Clinician: Referring Sareen Randon: [name] Treating Devereaux Grayson/Extender: [name] in Treatment: 3 Compression Therapy Performed for Wound Assessment: Wound #8 Left,Posterior Lower Leg Performed By: Clinician [name] Coria, RN Compression Type: Three Layer Post Procedure Diagnosis Same as Pre-procedure Electronic Signature(s) Signed: 10/21/2020 3:58:41 PM By: [name] Coria RN Entered By: [name] on 10/21/2020  15:58:41 [name], [name] ([id number]) -------------------------------------------------------------------------------- Encounter Discharge Information Details Patient Name: [name] Date of Service: 10/21/2020 2:30 PM Medical Record Number: [id number] Patient Account Number: [id number] Date of Birth/Sex: 1965-10-18 (55 y.o. F) Treating RN: [name] Primary Care Corean Yoshimura: [name] Other Clinician: Referring Elleah Hemsley: [name] Treating Mickala Laton/Extender: [name] in Treatment: 3 Encounter Discharge Information Items Discharge Condition: Stable Ambulatory Status: Ambulatory Discharge Destination: Home Transportation: Private Auto Accompanied By: self Schedule Follow-up Appointment: Yes Clinical Summary of Care: Patient Declined Electronic Signature(s) Signed: 10/21/2020 4:02:20 PM By: [name] Coria RN Entered By: [name] on 10/21/2020 16:02:20 Effinger, [name] ([id number]) -------------------------------------------------------------------------------- Lower Extremity Assessment Details Patient Name: [name] Date of Service: 10/21/2020 2:30 PM Medical Record Number: [id number] Patient Account Number: [id number] Date of Birth/Sex: 1965/04/22 (55 y.o. F) Treating RN: [name] Primary Care  Cohick: [name] Other Clinician: Referring Lebert Lovern: [name] Treating Isatu Macinnes/Extender: Jeri Cos Weeks in Treatment: 3 Edema Assessment Assessed: [Left: No] [Right: No] [Left: Edema] [Right: :] Calf Left: Right: Point of Measurement: 37 cm From Medial Instep 48 cm Ankle Left: Right: Point of Measurement: 9 cm From Medial Instep 27 cm Vascular Assessment Pulses: Dorsalis Pedis Palpable: [Left:Yes] Electronic Signature(s) Signed: 10/23/2020 2:51:00 PM By: [name] Coria RN Entered By: [name] on 10/21/2020 15:10:13 [name], [name] ([id number]) -------------------------------------------------------------------------------- Multi Wound Chart  Details Patient Name: [name] Date of Service: 10/21/2020 2:30 PM Medical Record Number: [id number] Patient Account Number: [id number] Date of Birth/Sex: 1965-06-21 (55 y.o. F) Treating RN: [name] Primary Care Buren Havey: [name] Other Clinician: Referring Cenia Zaragosa: [name] Treating Kylil Swopes/Extender: [name] in Treatment: 3 Vital Signs Height(in): Pulse(bpm): 59 Weight(lbs): Blood Pressure(mmHg): 123/50 Body Mass Index(BMI): Temperature(F): 98.6 Respiratory Rate(breaths/min): 20 Photos: [N/A:N/A] Wound Location: Left, Anterior Lower Leg Left, Posterior Lower Leg N/A Wounding Event: Gradually Appeared Gradually Appeared N/A Primary Etiology: Diabetic Wound/Ulcer of the Lower Diabetic Wound/Ulcer of the Lower N/A Extremity Extremity Comorbid History: Congestive Heart Failure, Coronary Congestive Heart Failure, Coronary N/A Artery Disease, Hypertension, Artery Disease, Hypertension, Myocardial Infarction, Peripheral Myocardial Infarction, Peripheral Venous Disease, Type II Diabetes, Venous Disease, Type II Diabetes, End Stage Renal Disease, End Stage Renal Disease, Neuropathy Neuropathy Date Acquired: 09/20/2020 09/20/2020  N/A Weeks of Treatment: 3 3 N/A Wound Status: Open Open N/A Measurements L x W x D (cm) 10x4x0.1 2x4x0.1 N/A Area (cm) : 31.416 6.283 N/A Volume (cm) : 3.142 0.628 N/A % Reduction in Area: 9.10% -113.30% N/A % Reduction in Volume: 9.10% -112.90% N/A Classification: Grade 2 Grade 2 N/A Exudate Amount: Medium Medium N/A Exudate Type: Serosanguineous Serosanguineous N/A Exudate Color: red, brown red, brown N/A Granulation Amount: Medium (34-66%) Small (1-33%) N/A Granulation Quality: N/A Pink N/A Necrotic Amount: Medium (34-66%) Large (67-100%) N/A Exposed Structures: Fat Layer (Subcutaneous Tissue): Fat Layer (Subcutaneous Tissue): N/A Yes Yes Fascia: No Fascia: No Tendon: No Tendon: No Muscle: No Muscle: No Joint:  No Joint: No Bone: No Bone: No Epithelialization: None None N/A Treatment Notes Electronic Signature(s) Signed: 10/21/2020 3:58:23 PM By: [name] Coria RN Entered By: [name] on 10/21/2020 15:58:23 [name], [name] ([id number]) [name], [name] ([id number]) -------------------------------------------------------------------------------- Multi-Disciplinary Care Plan Details Patient Name: [name] Date of Service: 10/21/2020 2:30 PM Medical Record Number: [id number] Patient Account Number: [id number] Date of Birth/Sex: [birth year] (55 y.o. F) Treating RN: [name] Primary Care Feather Berrie: [name] Other Clinician: Referring Brenn Deziel: [name] Treating Norman Bier/Extender: [name] in Treatment: 3 Active Inactive Abuse / Safety / Falls / Self Care Management Nursing Diagnoses: Potential for injury related to falls Goals: Patient will remain injury free related to falls Date Initiated: 09/30/2020 Target Resolution Date: 10/31/2020 Goal Status: Active Interventions: Assess Activities of Daily Living upon admission and as needed Assess fall risk on admission and as needed Assess: immobility, friction, shearing, incontinence upon admission and as needed Assess impairment of mobility on admission and as needed per policy Assess personal safety and home safety (as indicated) on admission and as needed Assess self care needs on admission and as needed Notes: Medication Nursing Diagnoses: Knowledge deficit related to medication safety: actual or potential Goals: Patient/caregiver will demonstrate understanding of all current medications Date Initiated: 09/30/2020 Target Resolution Date: 10/31/2020 Goal Status: Active Interventions: Assess for medication contraindications each visit where new medications are prescribed Assess patient/caregiver ability to manage medication regimen upon admission and as needed Notes: Nutrition Nursing Diagnoses: Potential for  alteratiion in Nutrition/Potential for imbalanced nutrition Goals: Patient/caregiver will maintain therapeutic glucose control Date Initiated: 09/30/2020 Target Resolution Date: 10/31/2020 Goal Status: Active Interventions: Assess HgA1c results as ordered upon admission and as needed Assess patient nutrition upon admission and as needed per policy Notes: Electronic Signature(s) [name], [name] ([id number]) Signed: 10/21/2020 3:58:12 PM By: [name] Coria RN Entered By: [name] on 10/21/2020 15:58:11 [name], [name] ([id number]) -------------------------------------------------------------------------------- Pain Assessment Details Patient Name: [name] Date of Service: 10/21/2020 2:30 PM Medical Record Number: [id number] Patient Account Number: [id number] Date of Birth/Sex: 1965-05-21 (55 y.o. F) Treating RN: [name] Primary Care Anthea Udovich: [name] Other Clinician: Referring Cletis Muma: [name] Treating Elowyn Raupp/Extender: [name] in Treatment: 3 Active Problems Location of Pain Severity and Description of Pain Patient Has Paino No Site Locations Pain Management and Medication Current Pain Management: Electronic Signature(s) Signed: 10/23/2020 2:51:00 PM By: [name] Coria RN Entered By: [name] on 10/21/2020 14:57:57 [name], [name] ([id number]) -------------------------------------------------------------------------------- Patient/Caregiver Education Details Patient Name: [name] Date of Service: 10/21/2020 2:30 PM Medical Record Number: [id number] Patient Account Number: [id number] Date of Birth/Gender: April 29, 1965 (55 y.o. F) Treating RN: [name] Primary Care Physician: [name] Other Clinician: Referring Physician: Laurance [name] Treating Physician/Extender: [name] in Treatment: 3 Education Assessment Education Provided To: Patient Education Topics Provided Wound/Skin Impairment: Methods: Explain/Verbal Responses: State  content correctly Electronic Signature(s) Signed: 10/23/2020 2:51:00 PM By: [name] Coria RN Entered By: [name] on 10/21/2020 16:01:26 [name], [name] ([id number]) -------------------------------------------------------------------------------- Wound Assessment Details Patient Name: [name] Date of Service: 10/21/2020 2:30 PM Medical Record Number: [id number] Patient Account Number: [id number] Date of Birth/Sex: 1965-04-22 (55 y.o. F) Treating RN: [name] Primary Care Zerrick Hanssen: [name] Other Clinician: Referring Tamakia Porto: [name] Treating Nou Chard/Extender: Jeri Cos Weeks in Treatment: 3 Wound Status Wound Number: 7 Primary Diabetic Wound/Ulcer of the Lower Extremity Etiology: Wound Location: Left, Anterior Lower Leg Wound Open Wounding Event: Gradually Appeared Status: Date Acquired: 09/20/2020 Comorbid Congestive Heart Failure, Coronary Artery Disease, Weeks Of Treatment: 3 History: Hypertension, Myocardial Infarction, Peripheral Venous Clustered Wound: No Disease, Type II Diabetes, End Stage Renal Disease, Neuropathy Photos Wound Measurements Length: (cm) 10 Width: (cm) 4 Depth: (cm) 0.1 Area: (cm) 31.416 Volume: (cm) 3.142 % Reduction in Area: 9.1% % Reduction in Volume: 9.1% Epithelialization: None Tunneling: No Undermining: No Wound Description Classification: Grade 2 Exudate Amount: Medium Exudate Type: Serosanguineous Exudate Color: red, brown Foul Odor After Cleansing: No Wound Bed Granulation Amount: Medium (34-66%) Exposed Structure Necrotic Amount: Medium (34-66%) Fascia Exposed: No Necrotic Quality: Adherent Slough Fat Layer (Subcutaneous Tissue) Exposed: Yes Tendon Exposed: No Muscle Exposed: No Joint Exposed: No Bone Exposed: No Treatment Notes Wound #7 (Lower Leg) Wound Laterality: Left, Anterior Cleanser Soap and Water Discharge Instruction: Gently cleanse wound with antibacterial soap, rinse and pat dry prior to  dressing wounds Peri-Wound Care Lipps, Floy ([id number]) Topical Primary Dressing Silvercel 4 1/4x 4 1/4 (in/in) Discharge Instruction: Apply Silvercel 4 1/4x 4 1/4 (in/in) as instructed Secondary Dressing ABD Pad 5x9 (in/in) Discharge Instruction: Cover with ABD pad Secured With Compression Wrap Profore Lite LF 3 Multilayer Compression Bandaging System Discharge Instruction: Apply 3 multi-layer wrap as prescribed. Compression Stockings Add-Ons Electronic Signature(s) Signed: 10/23/2020 2:51:00 PM By: [name] Coria RN Entered By: [name] on 10/21/2020 15:09:25 Somes, Veanna ([id number]) -------------------------------------------------------------------------------- Wound Assessment Details Patient Name: [name] Date of Service: 10/21/2020 2:30 PM Medical Record Number: [id number] Patient Account Number: [id number] Date of Birth/Sex: 1966-01-19 (55 y.o. F) Treating RN: [name] Primary Care Artelia Game: [name] Other Clinician: Referring Joanmarie Tsang: [name] Treating Cionna Collantes/Extender: Jeri Cos Weeks in Treatment: 3 Wound Status Wound Number: 8 Primary Diabetic Wound/Ulcer of the Lower Extremity Etiology: Wound Location: Left, Posterior Lower Leg Wound Open Wounding Event: Gradually Appeared Status: Date Acquired: 09/20/2020 Comorbid Congestive Heart Failure, Coronary Artery Disease, Weeks Of Treatment: 3 History: Hypertension, Myocardial Infarction, Peripheral Venous Clustered Wound: No Disease, Type II Diabetes, End Stage Renal Disease, Neuropathy Photos Wound Measurements Length: (cm) 2 Width: (cm) 4 Depth: (cm) 0.1 Area: (cm) 6.283 Volume: (cm) 0.628 % Reduction in Area: -113.3% % Reduction in Volume: -112.9% Epithelialization: None Tunneling: No Undermining: No Wound Description Classification: Grade 2 Exudate Amount: Medium Exudate Type: Serosanguineous Exudate Color: red, brown Foul Odor After Cleansing: No Slough/Fibrino  Yes Wound Bed Granulation Amount: Small (1-33%) Exposed Structure Granulation Quality: Pink Fascia Exposed: No Necrotic Amount: Large (67-100%) Fat Layer (Subcutaneous Tissue) Exposed: Yes Necrotic Quality: Adherent Slough Tendon Exposed: No Muscle Exposed: No Joint Exposed: No Bone Exposed: No Treatment Notes Wound #8 (Lower Leg) Wound Laterality: Left, Posterior Cleanser Soap and Water Discharge Instruction: Gently cleanse wound with antibacterial soap, rinse and pat dry prior to dressing wounds Peri-Wound Care Woolworth, Katilynn ([id number]) Topical Primary Dressing Silvercel 4 1/4x 4 1/4 (in/in) Discharge Instruction: Apply Silvercel 4 1/4x 4 1/4 (in/in) as instructed Secondary Dressing ABD Pad  5x9 (in/in) Discharge Instruction: Cover with ABD pad Secured With Compression Wrap Profore Lite LF 3 Multilayer Compression Bandaging System Discharge Instruction: Apply 3 multi-layer wrap as prescribed. Compression Stockings Circaid Juxta Lite Compression Wrap Quantity: 1 Left Leg Compression Amount: 30-40 mmHg Discharge Instruction: Apply Circaid Juxta Lite Compression Wrap as directed Add-Ons Electronic Signature(s) Signed: 10/23/2020 2:51:00 PM By: [name] Coria RN Entered By: [name] on 10/21/2020 15:09:54 [place], Fabiana ([id number]) -------------------------------------------------------------------------------- Vitals Details Patient Name: [name] Date of Service: 10/21/2020 2:30 PM Medical Record Number: [id number] Patient Account Number: [id number] Date of Birth/Sex: 22-Jan-1966 (55 y.o. F) Treating RN: [name] Primary Care Malike Foglio: [name] Other Clinician: Referring Dewayne Severe: [name] Treating Jerald Hennington/Extender: Jeri Cos Weeks in Treatment: 3 Vital Signs Time Taken: 14:57 Temperature (F): 98.6 Pulse (bpm): 79 Respiratory Rate (breaths/min): 20 Blood Pressure (mmHg): 123/50 Reference Range: 80 - 120 mg / dl Electronic  Signature(s) Signed: 10/23/2020 2:51:00 PM By: [name] Coria RN Entered By: [name] on 10/21/2020 14:57:46

## 2020-10-24 ENCOUNTER — Encounter (HOSPITAL_COMMUNITY): Payer: Self-pay

## 2020-10-26 ENCOUNTER — Other Ambulatory Visit (HOSPITAL_COMMUNITY): Payer: Self-pay | Admitting: Cardiology

## 2020-10-27 ENCOUNTER — Other Ambulatory Visit (HOSPITAL_COMMUNITY): Payer: Self-pay | Admitting: Cardiology

## 2020-10-29 ENCOUNTER — Other Ambulatory Visit: Payer: Self-pay

## 2020-10-29 DIAGNOSIS — E11622 Type 2 diabetes mellitus with other skin ulcer: Secondary | ICD-10-CM | POA: Diagnosis not present

## 2020-11-01 ENCOUNTER — Telehealth (HOSPITAL_COMMUNITY): Payer: Self-pay | Admitting: *Deleted

## 2020-11-01 ENCOUNTER — Other Ambulatory Visit (HOSPITAL_COMMUNITY): Payer: Self-pay | Admitting: *Deleted

## 2020-11-01 MED ORDER — POTASSIUM CHLORIDE ER 10 MEQ PO TBCR: 20.0000 meq | EXTENDED_RELEASE_TABLET | Freq: Every day | ORAL | 3 refills | Status: DC

## 2020-11-01 NOTE — Telephone Encounter (Signed)
Pt returned call  she is aware of results and agreeable with starting potassium. Pt does not want to see a nephrologist and will talk to Ute during office visit 8/25

## 2020-11-01 NOTE — Progress Notes (Signed)
[name], [name] ([id number]) Visit Report for 10/29/2020 Arrival Information Details Patient Name: [name], [name] Date of Service: 10/29/2020 11:00 AM Medical Record Number: [id number] Patient Account Number: [id number] Date of Birth/Sex: 08-18-1965 (55 y.o. F) Treating RN: [name] Primary Care [name]: [name] Other Clinician: Referring [name]: [name] Treating Min Collymore/Extender: Skipper Cliche in Treatment: 4 Visit Information History Since Last Visit All ordered tests and consults were completed: No Patient Arrived: Ambulatory Added or deleted any medications: No Arrival Time: 11:31 Any new allergies or adverse reactions: No Accompanied By: self Had a fall or experienced change in No Transfer Assistance: None activities of daily living that may affect Patient Identification Verified: Yes risk of falls: Secondary Verification Process Completed: Yes Signs or symptoms of abuse/neglect since last visito No Patient Requires Transmission-Based Precautions: No Hospitalized since last visit: No Patient Has Alerts: No Implantable device outside of the clinic excluding No cellular tissue based products placed in the center since last visit: Has Dressing in Place as Prescribed: Yes Has Compression in Place as Prescribed: Yes Pain Present Now: No Electronic Signature(s) Signed: 10/31/2020 8:40:56 PM By: [name] Coria RN Entered By: [name] on 10/29/2020 11:31:28 [name], [name] ([id number]) -------------------------------------------------------------------------------- Clinic Level of Care Assessment Details Patient Name: [name] Date of Service: 10/29/2020 11:00 AM Medical Record Number: [id number] Patient Account Number: [id number] Date of Birth/Sex: 01-26-1966 (55 y.o. F) Treating RN: [name] Primary Care Tokiko Diefenderfer: [name] Other Clinician: Referring Delvecchio Madole: [name] Treating Brasen Bundren/Extender: Skipper Cliche in Treatment: 4 Clinic Level  of Care Assessment Items TOOL 1 Quantity Score []  - Use when EandM and Procedure is performed on INITIAL visit 0 ASSESSMENTS - Nursing Assessment / Reassessment []  - General Physical Exam (combine w/ comprehensive assessment (listed just below) when performed on new 0 pt. evals) []  - 0 Comprehensive Assessment (HX, ROS, Risk Assessments, Wounds Hx, etc.) ASSESSMENTS - Wound and Skin Assessment / Reassessment []  - Dermatologic / Skin Assessment (not related to wound area) 0 ASSESSMENTS - Ostomy and/or Continence Assessment and Care []  - Incontinence Assessment and Management 0 []  - 0 Ostomy Care Assessment and Management (repouching, etc.) PROCESS - Coordination of Care []  - Simple Patient / Family Education for ongoing care 0 []  - 0 Complex (extensive) Patient / Family Education for ongoing care []  - 0 Staff obtains Programmer, systems, Records, Test Results / Process Orders []  - 0 Staff telephones HHA, Nursing Homes / Clarify orders / etc []  - 0 Routine Transfer to another Facility (non-emergent condition) []  - 0 Routine Hospital Admission (non-emergent condition) []  - 0 New Admissions / Biomedical engineer / Ordering NPWT, Apligraf, etc. []  - 0 Emergency Hospital Admission (emergent condition) PROCESS - Special Needs []  - Pediatric / Minor Patient Management 0 []  - 0 Isolation Patient Management []  - 0 Hearing / Language / Visual special needs []  - 0 Assessment of Community assistance (transportation, D/C planning, etc.) []  - 0 Additional assistance / Altered mentation []  - 0 Support Surface(s) Assessment (bed, cushion, seat, etc.) INTERVENTIONS - Miscellaneous []  - External ear exam 0 []  - 0 Patient Transfer (multiple staff / Civil Service fast streamer / Similar devices) []  - 0 Simple Staple / Suture removal (25 or less) []  - 0 Complex Staple / Suture removal (26 or more) []  - 0 Hypo/Hyperglycemic Management (do not check if billed separately) []  - 0 Ankle / Brachial Index (ABI) -  do not check if billed separately Has the patient been seen at the hospital within the last three years: Yes Total  Score: 0 Level Of Care: ____ [name] ([id number]) Electronic Signature(s) Signed: 10/31/2020 8:40:56 PM By: [name] Coria RN Entered By: [name] on 10/29/2020 11:33:02 [name], [name] ([id number]) -------------------------------------------------------------------------------- Compression Therapy Details Patient Name: [name] Date of Service: 10/29/2020 11:00 AM Medical Record Number: [id number] Patient Account Number: [id number] Date of Birth/Sex: 1965/10/09 (55 y.o. F) Treating RN: [name] Primary Care Salene Mohamud: [name] Other Clinician: Referring Miles Leyda: [name] Treating Marley Pakula/Extender: Skipper Cliche in Treatment: 4 Compression Therapy Performed for Wound Assessment: Wound #7 Left,Anterior Lower Leg Performed By: Clinician [name] Coria, RN Compression Type: Three Layer Electronic Signature(s) Signed: 10/31/2020 8:40:56 PM By: [name] Coria RN Entered By: [name] on 10/29/2020 11:32:20 [name], [name] ([id number]) -------------------------------------------------------------------------------- Compression Therapy Details Patient Name: [name] Date of Service: 10/29/2020 11:00 AM Medical Record Number: [id number] Patient Account Number: [id number] Date of Birth/Sex: Oct 13, 1965 (55 y.o. F) Treating RN: [name] Primary Care Saydie Gerdts: [name] Other Clinician: Referring Derren Suydam: [name] Treating Keilen Kahl/Extender: Skipper Cliche in Treatment: 4 Compression Therapy Performed for Wound Assessment: Wound #8 Left,Posterior Lower Leg Performed By: Clinician [name] Coria, RN Compression Type: Three Layer Electronic Signature(s) Signed: 10/31/2020 8:40:56 PM By: [name] Coria RN Entered By: [name] on 10/29/2020 11:32:20 [name], [name]  ([id number]) -------------------------------------------------------------------------------- Encounter Discharge Information Details Patient Name: [name] Date of Service: 10/29/2020 11:00 AM Medical Record Number: [id number] Patient Account Number: [id number] Date of Birth/Sex: April 20, 1965 (55 y.o. F) Treating RN: [name] Primary Care Malachai Schalk: [name] Other Clinician: Referring Kemarion Abbey: [name] Treating Jacynda Brunke/Extender: Skipper Cliche in Treatment: 4 Encounter Discharge Information Items Discharge Condition: Stable Ambulatory Status: Ambulatory Discharge Destination: Home Transportation: Private Auto Accompanied By: self Schedule Follow-up Appointment: Yes Clinical Summary of Care: Patient Declined Electronic Signature(s) Signed: 10/31/2020 8:40:56 PM By: [name] Coria RN Entered By: [name] on 10/29/2020 11:32:52 [name], [name] ([id number]) -------------------------------------------------------------------------------- Wound Assessment Details Patient Name: [name] Date of Service: 10/29/2020 11:00 AM Medical Record Number: [id number] Patient Account Number: [id number] Date of Birth/Sex: 1965/06/18 (55 y.o. F) Treating RN: [name] Primary Care Toriano Aikey: [name] Other Clinician: Referring Dmya Long: [name] Treating [name]/Extender: Jeri Cos Weeks in Treatment: 4 Wound Status Wound Number: 7 Primary Diabetic Wound/Ulcer of the Lower Extremity Etiology: Wound Location: Left, Anterior Lower Leg Wound Open Wounding Event: Gradually Appeared Status: Date Acquired: 09/20/2020 Comorbid Congestive Heart Failure, Coronary Artery Disease, Weeks Of Treatment: 4 History: Hypertension, Myocardial Infarction, Peripheral Venous Clustered Wound: No Disease, Type II Diabetes, End Stage Renal Disease, Neuropathy Wound Measurements Length: (cm) 10 Width: (cm) 4 Depth: (cm) 0.1 Area: (cm) 31.416 Volume: (cm) 3.142 % Reduction in Area:  9.1% % Reduction in Volume: 9.1% Epithelialization: None Tunneling: No Undermining: No Wound Description Classification: Grade 2 Exudate Amount: Medium Exudate Type: Serosanguineous Exudate Color: red, brown Foul Odor After Cleansing: No Wound Bed Granulation Amount: Medium (34-66%) Exposed Structure Necrotic Amount: Medium (34-66%) Fascia Exposed: No Necrotic Quality: Adherent Slough Fat Layer (Subcutaneous Tissue) Exposed: Yes Tendon Exposed: No Muscle Exposed: No Joint Exposed: No Bone Exposed: No Treatment Notes Wound #7 (Lower Leg) Wound Laterality: Left, Anterior Cleanser Soap and Water Discharge Instruction: Gently cleanse wound with antibacterial soap, rinse and pat dry prior to dressing wounds Peri-Wound Care Topical Primary Dressing Silvercel 4 1/4x 4 1/4 (in/in) Discharge Instruction: Apply Silvercel 4 1/4x 4 1/4 (in/in) as instructed Secondary Dressing ABD Pad 5x9 (in/in) Discharge Instruction: Cover with ABD pad Secured With Compression Wrap Profore Lite LF 3 Multilayer Compression [name], [name] ([id number]) Discharge Instruction:  Apply 3 multi-layer wrap as prescribed. Compression Stockings Add-Ons Electronic Signature(s) Signed: 10/31/2020 8:40:56 PM By: [name] Coria RN Entered By: [name] on 10/29/2020 11:31:52 [name], [name] ([id number]) -------------------------------------------------------------------------------- Wound Assessment Details Patient Name: [name] Date of Service: 10/29/2020 11:00 AM Medical Record Number: [id number] Patient Account Number: [id number] Date of Birth/Sex: April 03, 1965 (55 y.o. F) Treating RN: [name] Primary Care Hydie Langan: [name] Other Clinician: Referring Aubra Pappalardo: [name] Treating Keilee Denman/Extender: Jeri Cos Weeks in Treatment: 4 Wound Status Wound Number: 8 Primary Diabetic Wound/Ulcer of the Lower Extremity Etiology: Wound Location: Left, Posterior Lower Leg Wound  Open Wounding Event: Gradually Appeared Status: Date Acquired: 09/20/2020 Comorbid Congestive Heart Failure, Coronary Artery Disease, Weeks Of Treatment: 4 History: Hypertension, Myocardial Infarction, Peripheral Venous Clustered Wound: No Disease, Type II Diabetes, End Stage Renal Disease, Neuropathy Wound Measurements Length: (cm) 2 Width: (cm) 4 Depth: (cm) 0.1 Area: (cm) 6.283 Volume: (cm) 0.628 % Reduction in Area: -113.3% % Reduction in Volume: -112.9% Epithelialization: None Tunneling: No Undermining: No Wound Description Classification: Grade 2 Exudate Amount: Medium Exudate Type: Serosanguineous Exudate Color: red, brown Foul Odor After Cleansing: No Slough/Fibrino Yes Wound Bed Granulation Amount: Small (1-33%) Exposed Structure Granulation Quality: Pink Fascia Exposed: No Necrotic Amount: Large (67-100%) Fat Layer (Subcutaneous Tissue) Exposed: Yes Necrotic Quality: Adherent Slough Tendon Exposed: No Muscle Exposed: No Joint Exposed: No Bone Exposed: No Treatment Notes Wound #8 (Lower Leg) Wound Laterality: Left, Posterior Cleanser Soap and Water Discharge Instruction: Gently cleanse wound with antibacterial soap, rinse and pat dry prior to dressing wounds Peri-Wound Care Topical Primary Dressing Silvercel 4 1/4x 4 1/4 (in/in) Discharge Instruction: Apply Silvercel 4 1/4x 4 1/4 (in/in) as instructed Secondary Dressing ABD Pad 5x9 (in/in) Discharge Instruction: Cover with ABD pad Secured With Compression Wrap Profore Lite LF 3 Multilayer Compression Jellico Hargreaves, Saloni ([id number]) Discharge Instruction: Apply 3 multi-layer wrap as prescribed. Compression Stockings Circaid Juxta Lite Compression Wrap Quantity: 1 Left Leg Compression Amount: 30-40 mmHg Discharge Instruction: Apply Circaid Juxta Lite Compression Wrap as directed Add-Ons Electronic Signature(s) Signed: 10/31/2020 8:40:56 PM By: [name] Coria RN Entered By: [name]  on 10/29/2020 11:32:01

## 2020-11-04 ENCOUNTER — Other Ambulatory Visit: Payer: Self-pay

## 2020-11-04 ENCOUNTER — Encounter: Payer: Medicare Other | Admitting: Physician Assistant

## 2020-11-04 DIAGNOSIS — E11622 Type 2 diabetes mellitus with other skin ulcer: Secondary | ICD-10-CM | POA: Diagnosis not present

## 2020-11-04 NOTE — Progress Notes (Addendum)
[name] ([id number]) Visit Report for 11/04/2020 Chief Complaint Document Details Patient Name: [name] Date of Service: 11/04/2020 3:00 PM Medical Record Number: [id number] Patient Account Number: [id number] Date of Birth/Sex: [birth year] (55 y.o. F) Treating RN: [name] Primary Care Provider: Laurance [name] Other Clinician: Referring Provider: Laurance [name] Treating Provider/Extender: [name] in Treatment: 5 Information Obtained from: Patient Chief Complaint Left LE Ulcer Electronic Signature(s) Signed: 11/04/2020 3:11:37 PM By: [name] Keeler PA-C Entered By: [name] on 11/04/2020 15:11:37 [name], [name] ([id number]) -------------------------------------------------------------------------------- HPI Details Patient Name: [name] Date of Service: 11/04/2020 3:00 PM Medical Record Number: [id number] Patient Account Number: [id number] Date of Birth/Sex: 06-May-1965 (55 y.o. F) Treating RN: [name] Primary Care Provider: Laurance [name] Other Clinician: Referring Provider: Laurance [name] Treating Provider/Extender: [name] in Treatment: 5 History of Present Illness HPI Description: 10/24/2018 on evaluation today patient presents for initial evaluation or clinic concerning issues that she has been having with lymphedema for quite some time. Unfortunately she has several wound openings at this point that secondary to her lymphedema/venous stasis are giving her trouble and leaking quite severely. She also has diabetes along with hypertension and stage III chronic kidney disease. Fortunately there is no signs of active infection at this time. She is going to likely require debridement of the left leg ulcer upon evaluation today just based on what I am seeing. Fortunately there is no wound opening on the right. Overall the patient seems to be doing quite well and again there is no evidence of systemic infection which is good news. No fevers, chills,  nausea, vomiting, or diarrhea. 10/31/2018 on evaluation today patient actually appears to be doing excellent in regard to her lower extremity ulcers. She has been tolerating the dressing changes without complication. Fortunately there is no signs of active infection at this time. She has tolerated 3 layer compression wrap without complication. 11/07/2018 upon evaluation today patient actually appears to be doing very well with regard to her left lower extremity ulcers. She has been tolerating the dressing changes without complication. Fortunately there is no signs of active infection. No fevers, chills, nausea, vomiting, or diarrhea. 11/21/2018 upon evaluation today patient appears to be doing quite well with regard to her lower extremity ulcers. In fact both areas seem to be showing signs of good improvement which is excellent. She is not having as much pain as she has in the past and again has a lot of healing compared to previous visits as well. 12/05/2018 on evaluation today patient presents for follow-up concerning her ongoing issues with her bilateral lower extremity ulcers. The good news is her right lower extremity is showing signs of completely healing at this time which is great news. Fortunately there is no evidence of active infection. On the left she has just a very small area still remaining that is open at this time all in all she is very close to complete closure in my opinion. She does have compression stockings to wear at home. 12/12/2018 on evaluation today patient appears to be doing excellent in regard to her left lateral lower extremity ulcer. She has been tolerating the dressing changes without complication. Fortunately there is no signs of active infection at this time. In fact this appears to be pretty much healed at this point although again she is not 100% today. No fevers, chills, nausea, vomiting, or diarrhea. 12/19/2018 on evaluation today patient actually appears to be doing  excellent with regard to her lower extremity ulcer  in fact this appears to be completely healed today which is all some. She has done extremely well with wound care measures. No fevers, chills, nausea, vomiting, or diarrhea. ------------------------------------ 11/01/19-Readmission to the clinic Patient presents with left leg pain, wounds posterior calf, onset about 4 weeks, denies any fevers chills or shakes, has not been using anything to these wounds, was given compression stockings at last discharge from the clinic but has not been able to use them as they rolled down and cause creases Patient's history significant for type 2 diabetes insulin requiring, A1c of 6.9 lately, hypertension, chronic pain 8/18; patient readmitted that the clinic last week. She has wounds on her left lateral posterior lower leg all of this in close juxtaposition i.e. a localized site. We've been using silver alginate under 3 layer compression apparently the wound surface area is better. She was wearing compression stockings from elastic therapy but says they were falling down. As she progresses more towards healing will need to address what we use in terms of compression stockings perhaps external compression garments 11/17/2019 on evaluation today patient appears to be doing well with regard to her legs currently. She does have 2 wounds which are actually measuring much better the more posterior is actually doing very well which I am pleased with the other though smaller is not quite a small but nonetheless on the lateral aspect does seem to be improving. 11/23/2019 on evaluation today patient appears to be doing well in regard to her wounds. In fact the wound on the posterior aspect of her leg appears healed the lateral aspect is still open but extremely small. 9/15; this is a patient with chronic venous insufficiency and secondary lymphedema. She did not keep her clinic appointment last week and hence the left leg is a  lot more swollen since she had to take the wrap off at some point. She has a small weeping area on the left lateral lower leg. She has probably a juxta lite stocking for the left leg I have asked her to bring that in when she comes to her clinic appointment next week at which time she should be healed 10/6; this is a patient with chronic venous insufficiency and secondary lymphedema left greater than right. Skin changes of chronic lymphedema especially in the left leg. She has not been here in 3 weeks. She took the wrap off 2 weeks ago. She has some very old 20/30 stockings from elastic therapy in [place] she has been using. Fortunately her leg is closed. She also has a single Farrow wrap for the left leg Readmission: 06/18/2020 upon evaluation today patient presents for reevaluation here in the clinic. She is a long-term patient that we have seen intermittently when she has had flareups of issues with her lower extremity secondary to chronic venous stasis. With that being said unfortunately today she is having a significant issue with a wound which is actually quite significant over the right lower extremity. She did see dermatology they put Vaseline followed by T[place]a island dressings on her unfortunately she is continuing to have significant issues however here with pain and in fact this wrap was extremely stuck to her upon evaluation today. Nonetheless I believe that she is going to require a little bit different approach to try to keep things from sticking and hopefully aid in getting this to heal more effectively and quickly. With that being said she does have a history of Coppock, Kiara ([id number]) chronic venous insufficiency, lymphedema, diabetes mellitus type 2,  chronic kidney disease stage III, hypertension, and congestive heart failure. This started as an injury in mid February and has worsened over the past 2 weeks. 07/04/2020 upon evaluation today patient appears to be doing well with  regard to her wound on the leg. Overall I am extremely pleased with where things stand and I do think that she is making excellent progress. There is no sign of infection right now which is also great news. I think that she is close to complete resolution. 07/09/20 upon evaluation today patient appears to be doing excellent in regard to her leg ulcer. She has been tolerating the dressing changes without complication and in general I am extremely pleased with where things stand today. There does not appear to be any signs of active infection at this time which is great news. 07/25/2020 upon evaluation today patient appears to be doing well with regard to her wound. She has been tolerating the dressing changes without complication. Fortunately there is no signs of active infection at this time and her wound appears to be completely healed. Readmission: 09/30/2020 upon evaluation today patient appears for reevaluation here in our clinic. She was discharged May 5 that unfortunately has started to have weeping and wounds on the left leg at this point. She tells me the right leg did weep some but nothing as significant as the left. With all that being said at this point the patient states that she figured it was best to come in here and she was having a lot of drainage and it was get all of her sheets and everything. She has been wearing her Velcro wraps sometimes but it does not sound like she is been wearing them all the time which I think is part of the reason why this reopened. She also unfortunately had a fall which was quite significant. This occurred I believe about a week or 2 ago. Otherwise patient's past medical history really has not changed significantly. 10/07/2020 upon evaluation today patient appears to be doing better in regard to her wounds of the lower extremities. Fortunately there does not appear to be any signs of active infection at this time which is great news. I do not see any evidence  of anything worsening but again she continues to have significant bilateral stage III lymphedema. This has been an ongoing issue. We actually have seen her this year pretty much from March through May of 2022. Following this she continued with compression at home following but then unfortunately reopened again. That was on July 11. With that being said this is an ongoing and recurrent issue for which honestly have seen her over the past 2 years pretty much consistently. Nonetheless we have had Velcro compression wraps that she uses at home and despite this she continues to have frequent and recurrent ulcerations. 7/27; patient presents for 1 week follow-up. She has no issues or complaints today. She denies signs of infection. 10/21/2020 upon evaluation today patient's leg actually appears to be doing decently well on the left there is nothing open on the right at all and I am pleased in that regard. On the left side I do think that she still has some areas of weeping I believe the compression wrap is still the best way to go although I am hopeful that we are headed in the right direction as far as getting this area to heal and silver. Upon inspection patient's wound bed actually showed signs of good granulation epithelization at this point. There does  not appear to be any evidence of active infection which is great news and overall I am extremely pleased with where things stand. 11/04/2020 upon evaluation today patient appears to be doing well with regard to her left leg. In fact this is getting very close to complete granulation and epithelization at this point I am very pleased with where we stand. I do not see any signs of infection currently. Electronic Signature(s) Signed: 11/04/2020 3:44:12 PM By: [name] Keeler PA-C Entered By: [name] on 11/04/2020 15:44:11 Knapper, [name] ([id number]) -------------------------------------------------------------------------------- Physical Exam  Details Patient Name: [name] Date of Service: 11/04/2020 3:00 PM Medical Record Number: [id number] Patient Account Number: [id number] Date of Birth/Sex: 12/10/1965 (55 y.o. F) Treating RN: [name] Primary Care Provider: Laurance [name] Other Clinician: Referring Provider: Laurance [name] Treating Provider/Extender: Jeri Cos Weeks in Treatment: 5 Constitutional Well-nourished and well-hydrated in no acute distress. Respiratory normal breathing without difficulty. Psychiatric this patient is able to make decisions and demonstrates good insight into disease process. Alert and Oriented x 3. pleasant and cooperative. Notes Patient's wound bed showed signs of good granulation and epithelization at this point. I think she is making great progress and I am very pleased in that regard. No fevers, chills, nausea, vomiting, or diarrhea. Electronic Signature(s) Signed: 11/04/2020 3:44:31 PM By: [name] Keeler PA-C Entered By: [name] on 11/04/2020 15:44:31 Kilty, [name] ([id number]) -------------------------------------------------------------------------------- Physician Orders Details Patient Name: [name] Date of Service: 11/04/2020 3:00 PM Medical Record Number: [id number] Patient Account Number: [id number] Date of Birth/Sex: [birth year] (55 y.o. F) Treating RN: [name] Primary Care Provider: Laurance [name] Other Clinician: Referring Provider: Laurance [name] Treating Provider/Extender: [name] in Treatment: 5 Verbal / Phone Orders: No Diagnosis Coding ICD-10 Coding Code Description I87.2 Venous insufficiency (chronic) (peripheral) I89.0 Lymphedema, not elsewhere classified L97.822 Non-pressure chronic ulcer of other part of left lower leg with fat layer exposed E11.622 Type 2 diabetes mellitus with other skin ulcer N18.30 Chronic kidney disease, stage 3 unspecified I10 Essential (primary) hypertension I50.42 Chronic combined systolic (congestive) and  diastolic (congestive) heart failure Follow-up Appointments o Return Appointment in 1 week. Bathing/ Shower/ Hygiene o May shower with wound dressing protected with water repellent cover or cast protector. Edema Control - Lymphedema / Segmental Compressive Device / Other o Optional: One layer of unna paste to top of compression wrap (to act as an anchor). o Patient to wear own Velcro compression garment. Remove compression stockings every night before going to bed and put on every morning when getting up. - right lower leg o Elevate, Exercise Daily and Avoid Standing for Long Periods of Time. o Elevate legs to the level of the heart and pump ankles as often as possible o Elevate leg(s) parallel to the floor when sitting. o Compression Pump: Use compression pump on left lower extremity for 60 minutes, twice daily. o Compression Pump: Use compression pump on right lower extremity for 60 minutes, twice daily. Wound Treatment Wound #7 - Lower Leg Wound Laterality: Left, Anterior Cleanser: Soap and Water 1 x Per Week/30 Days Discharge Instructions: Gently cleanse wound with antibacterial soap, rinse and pat dry prior to dressing wounds Primary Dressing: Silvercel 4 1/4x 4 1/4 (in/in) 1 x Per Week/30 Days Discharge Instructions: Apply Silvercel 4 1/4x 4 1/4 (in/in) as instructed Secondary Dressing: ABD Pad 5x9 (in/in) 1 x Per Week/30 Days Discharge Instructions: Cover with ABD pad Compression Wrap: Profore Lite LF 3 Multilayer Compression Bandaging System 1 x Per Week/30 Days Discharge  Instructions: Apply 3 multi-layer wrap as prescribed. Wound #8 - Lower Leg Wound Laterality: Left, Posterior Cleanser: Soap and Water 1 x Per Week/30 Days Discharge Instructions: Gently cleanse wound with antibacterial soap, rinse and pat dry prior to dressing wounds Primary Dressing: Silvercel 4 1/4x 4 1/4 (in/in) 1 x Per Week/30 Days Discharge Instructions: Apply Silvercel 4 1/4x 4 1/4 (in/in)  as instructed Secondary Dressing: ABD Pad 5x9 (in/in) 1 x Per Week/30 Days Discharge Instructions: Cover with ABD pad Compression Wrap: Profore Lite LF 3 Multilayer Compression Bandaging System 1 x Per Week/30 Days Discharge Instructions: Apply 3 multi-layer wrap as prescribed. Soule, Madai ([id number]) Compression Stockings: Circaid Juxta Lite Compression Wrap Left Leg Compression Amount: 30-40 mmHG Discharge Instructions: Apply Circaid Juxta Lite Compression Wrap as directed Electronic Signature(s) Signed: 11/04/2020 4:23:01 PM By: [name] Keeler PA-C Signed: 11/05/2020 8:31:56 AM By: [name] Coria RN Entered By: [name] on 11/04/2020 15:32:40 Vanvorst, Tajia ([id number]) -------------------------------------------------------------------------------- Problem List Details Patient Name: [name] Date of Service: 11/04/2020 3:00 PM Medical Record Number: [id number] Patient Account Number: [id number] Date of Birth/Sex: October 15, 1965 (55 y.o. F) Treating RN: [name] Primary Care Provider: Laurance [name] Other Clinician: Referring Provider: Laurance [name] Treating Provider/Extender: [name] in Treatment: 5 Active Problems ICD-10 Encounter Code Description Active Date MDM Diagnosis I87.2 Venous insufficiency (chronic) (peripheral) 09/30/2020 No Yes I89.0 Lymphedema, not elsewhere classified 09/30/2020 No Yes L97.822 Non-pressure chronic ulcer of other part of left lower leg with fat layer 09/30/2020 No Yes exposed E11.622 Type 2 diabetes mellitus with other skin ulcer 09/30/2020 No Yes N18.30 Chronic kidney disease, stage 3 unspecified 09/30/2020 No Yes I10 Essential (primary) hypertension 09/30/2020 No Yes I50.42 Chronic combined systolic (congestive) and diastolic (congestive) heart 09/30/2020 No Yes failure Inactive Problems Resolved Problems Electronic Signature(s) Signed: 11/04/2020 3:11:28 PM By: [name] Keeler PA-C Entered By: [name] on 11/04/2020  15:11:27 Teters, [name] ([id number]) -------------------------------------------------------------------------------- Progress Note Details Patient Name: [name] Date of Service: 11/04/2020 3:00 PM Medical Record Number: [id number] Patient Account Number: [id number] Date of Birth/Sex: 09-Mar-1966 (55 y.o. F) Treating RN: [name] Primary Care Provider: Laurance [name] Other Clinician: Referring Provider: Laurance [name] Treating Provider/Extender: [name] in Treatment: 5 Subjective Chief Complaint Information obtained from Patient Left LE Ulcer History of Present Illness (HPI) 10/24/2018 on evaluation today patient presents for initial evaluation or clinic concerning issues that she has been having with lymphedema for quite some time. Unfortunately she has several wound openings at this point that secondary to her lymphedema/venous stasis are giving her trouble and leaking quite severely. She also has diabetes along with hypertension and stage III chronic kidney disease. Fortunately there is no signs of active infection at this time. She is going to likely require debridement of the left leg ulcer upon evaluation today just based on what I am seeing. Fortunately there is no wound opening on the right. Overall the patient seems to be doing quite well and again there is no evidence of systemic infection which is good news. No fevers, chills, nausea, vomiting, or diarrhea. 10/31/2018 on evaluation today patient actually appears to be doing excellent in regard to her lower extremity ulcers. She has been tolerating the dressing changes without complication. Fortunately there is no signs of active infection at this time. She has tolerated 3 layer compression wrap without complication. 11/07/2018 upon evaluation today patient actually appears to be doing very well with regard to her left lower extremity ulcers. She has been tolerating the dressing changes without complication.  Fortunately  there is no signs of active infection. No fevers, chills, nausea, vomiting, or diarrhea. 11/21/2018 upon evaluation today patient appears to be doing quite well with regard to her lower extremity ulcers. In fact both areas seem to be showing signs of good improvement which is excellent. She is not having as much pain as she has in the past and again has a lot of healing compared to previous visits as well. 12/05/2018 on evaluation today patient presents for follow-up concerning her ongoing issues with her bilateral lower extremity ulcers. The good news is her right lower extremity is showing signs of completely healing at this time which is great news. Fortunately there is no evidence of active infection. On the left she has just a very small area still remaining that is open at this time all in all she is very close to complete closure in my opinion. She does have compression stockings to wear at home. 12/12/2018 on evaluation today patient appears to be doing excellent in regard to her left lateral lower extremity ulcer. She has been tolerating the dressing changes without complication. Fortunately there is no signs of active infection at this time. In fact this appears to be pretty much healed at this point although again she is not 100% today. No fevers, chills, nausea, vomiting, or diarrhea. 12/19/2018 on evaluation today patient actually appears to be doing excellent with regard to her lower extremity ulcer in fact this appears to be completely healed today which is all some. She has done extremely well with wound care measures. No fevers, chills, nausea, vomiting, or diarrhea. ------------------------------------ 11/01/19-Readmission to the clinic Patient presents with left leg pain, wounds posterior calf, onset about 4 weeks, denies any fevers chills or shakes, has not been using anything to these wounds, was given compression stockings at last discharge from the clinic but has not been able to  use them as they rolled down and cause creases Patient's history significant for type 2 diabetes insulin requiring, A1c of 6.9 lately, hypertension, chronic pain 8/18; patient readmitted that the clinic last week. She has wounds on her left lateral posterior lower leg all of this in close juxtaposition i.e. a localized site. We've been using silver alginate under 3 layer compression apparently the wound surface area is better. She was wearing compression stockings from elastic therapy but says they were falling down. As she progresses more towards healing will need to address what we use in terms of compression stockings perhaps external compression garments 11/17/2019 on evaluation today patient appears to be doing well with regard to her legs currently. She does have 2 wounds which are actually measuring much better the more posterior is actually doing very well which I am pleased with the other though smaller is not quite a small but nonetheless on the lateral aspect does seem to be improving. 11/23/2019 on evaluation today patient appears to be doing well in regard to her wounds. In fact the wound on the posterior aspect of her leg appears healed the lateral aspect is still open but extremely small. 9/15; this is a patient with chronic venous insufficiency and secondary lymphedema. She did not keep her clinic appointment last week and hence the left leg is a lot more swollen since she had to take the wrap off at some point. She has a small weeping area on the left lateral lower leg. She has probably a juxta lite stocking for the left leg I have asked her to bring that in when she  comes to her clinic appointment next week at which time she should be healed 10/6; this is a patient with chronic venous insufficiency and secondary lymphedema left greater than right. Skin changes of chronic lymphedema especially in the left leg. She has not been here in 3 weeks. She took the wrap off 2 weeks ago. She has  some very old 20/30 stockings from elastic therapy in [place] she has been using. Fortunately her leg is closed. She also has a single Farrow wrap for the left leg Readmission: 06/18/2020 upon evaluation today patient presents for reevaluation here in the clinic. She is a long-term patient that we have seen intermittently Dedmon, Angelisse ([id number]) when she has had flareups of issues with her lower extremity secondary to chronic venous stasis. With that being said unfortunately today she is having a significant issue with a wound which is actually quite significant over the right lower extremity. She did see dermatology they put Vaseline followed by Telfa island dressings on her unfortunately she is continuing to have significant issues however here with pain and in fact this wrap was extremely stuck to her upon evaluation today. Nonetheless I believe that she is going to require a little bit different approach to try to keep things from sticking and hopefully aid in getting this to heal more effectively and quickly. With that being said she does have a history of chronic venous insufficiency, lymphedema, diabetes mellitus type 2, chronic kidney disease stage III, hypertension, and congestive heart failure. This started as an injury in mid February and has worsened over the past 2 weeks. 07/04/2020 upon evaluation today patient appears to be doing well with regard to her wound on the leg. Overall I am extremely pleased with where things stand and I do think that she is making excellent progress. There is no sign of infection right now which is also great news. I think that she is close to complete resolution. 07/09/20 upon evaluation today patient appears to be doing excellent in regard to her leg ulcer. She has been tolerating the dressing changes without complication and in general I am extremely pleased with where things stand today. There does not appear to be any signs of active infection at  this time which is great news. 07/25/2020 upon evaluation today patient appears to be doing well with regard to her wound. She has been tolerating the dressing changes without complication. Fortunately there is no signs of active infection at this time and her wound appears to be completely healed. Readmission: 09/30/2020 upon evaluation today patient appears for reevaluation here in our clinic. She was discharged May 5 that unfortunately has started to have weeping and wounds on the left leg at this point. She tells me the right leg did weep some but nothing as significant as the left. With all that being said at this point the patient states that she figured it was best to come in here and she was having a lot of drainage and it was get all of her sheets and everything. She has been wearing her Velcro wraps sometimes but it does not sound like she is been wearing them all the time which I think is part of the reason why this reopened. She also unfortunately had a fall which was quite significant. This occurred I believe about a week or 2 ago. Otherwise patient's past medical history really has not changed significantly. 10/07/2020 upon evaluation today patient appears to be doing better in regard to her wounds of the lower  extremities. Fortunately there does not appear to be any signs of active infection at this time which is great news. I do not see any evidence of anything worsening but again she continues to have significant bilateral stage III lymphedema. This has been an ongoing issue. We actually have seen her this year pretty much from March through May of 2022. Following this she continued with compression at home following but then unfortunately reopened again. That was on July 11. With that being said this is an ongoing and recurrent issue for which honestly have seen her over the past 2 years pretty much consistently. Nonetheless we have had Velcro compression wraps that she uses at home and  despite this she continues to have frequent and recurrent ulcerations. 7/27; patient presents for 1 week follow-up. She has no issues or complaints today. She denies signs of infection. 10/21/2020 upon evaluation today patient's leg actually appears to be doing decently well on the left there is nothing open on the right at all and I am pleased in that regard. On the left side I do think that she still has some areas of weeping I believe the compression wrap is still the best way to go although I am hopeful that we are headed in the right direction as far as getting this area to heal and silver. Upon inspection patient's wound bed actually showed signs of good granulation epithelization at this point. There does not appear to be any evidence of active infection which is great news and overall I am extremely pleased with where things stand. 11/04/2020 upon evaluation today patient appears to be doing well with regard to her left leg. In fact this is getting very close to complete granulation and epithelization at this point I am very pleased with where we stand. I do not see any signs of infection currently. Objective Constitutional Well-nourished and well-hydrated in no acute distress. Vitals Time Taken: 3:15 PM, Temperature: 98.9 F, Pulse: 84 bpm, Respiratory Rate: 18 breaths/min, Blood Pressure: 152/67 mmHg. Respiratory normal breathing without difficulty. Psychiatric this patient is able to make decisions and demonstrates good insight into disease process. Alert and Oriented x 3. pleasant and cooperative. General Notes: Patient's wound bed showed signs of good granulation and epithelization at this point. I think she is making great progress and I am very pleased in that regard. No fevers, chills, nausea, vomiting, or diarrhea. Integumentary (Hair, Skin) Wound #7 status is Open. Original cause of wound was Gradually Appeared. The date acquired was: 09/20/2020. The wound has been in treatment 5  weeks. The wound is located on the Left,Anterior Lower Leg. The wound measures 7.2cm length x 2.5cm width x 0.1cm depth; 14.137cm^2 area and 1.414cm^3 volume. There is Fat Layer (Subcutaneous Tissue) exposed. There is no tunneling or undermining noted. There is a medium amount of serosanguineous drainage noted. There is medium (34-66%) granulation within the wound bed. There is a medium (34-66%) amount Meiklejohn, Jemmie ([id number]) of necrotic tissue within the wound bed including Adherent Slough. Wound #8 status is Open. Original cause of wound was Gradually Appeared. The date acquired was: 09/20/2020. The wound has been in treatment 5 weeks. The wound is located on the Left,Posterior Lower Leg. The wound measures 0.5cm length x 0.3cm width x 0.1cm depth; 0.118cm^2 area and 0.012cm^3 volume. There is Fat Layer (Subcutaneous Tissue) exposed. There is no tunneling or undermining noted. There is a medium amount of serosanguineous drainage noted. There is small (1-33%) pink granulation within the wound bed. There is  a large (67-100%) amount of necrotic tissue within the wound bed including Adherent Slough. 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 There was a Three Layer Compression Therapy Procedure by [name] Coria, RN. Post procedure Diagnosis Wound #7: Same as Pre-Procedure Wound #8 Pre-procedure diagnosis of Wound #8 is a Diabetic Wound/Ulcer of the Lower Extremity located on the Left,Posterior  Lower Leg . There was a Three Layer Compression Therapy Procedure by [name] Coria, RN. Post procedure Diagnosis Wound #8: Same as Pre-Procedure Plan Follow-up Appointments: Return Appointment in 1 week. Bathing/ Shower/ Hygiene: May shower with wound dressing protected with water repellent cover or cast protector. Edema Control - Lymphedema / Segmental Compressive Device / Other: Optional: One layer of unna paste to top of compression wrap (to act as an anchor). Patient to wear own Velcro compression garment. Remove compression stockings every night before going to bed and put on every morning when getting up. - right lower leg Elevate, Exercise Daily and Avoid Standing for Long Periods of Time. Elevate legs to the level of the heart and pump ankles as often as possible Elevate leg(s) parallel to the floor when sitting. Compression Pump: Use compression pump on left lower extremity for 60 minutes, twice daily. Compression Pump: Use compression pump on right lower extremity for 60 minutes, twice daily. WOUND #7: - Lower Leg Wound Laterality: Left, Anterior Cleanser: Soap and Water 1 x Per Week/30 Days Discharge Instructions: Gently cleanse wound with antibacterial soap, rinse and pat dry prior to dressing wounds Primary Dressing: Silvercel 4 1/4x 4 1/4 (in/in) 1 x Per Week/30 Days Discharge Instructions: Apply Silvercel 4 1/4x 4 1/4 (in/in) as instructed Secondary Dressing: ABD Pad 5x9 (in/in) 1 x Per Week/30 Days Discharge Instructions: Cover with ABD pad Compression Wrap: Profore Lite LF 3 Multilayer Compression Bandaging System 1 x Per Week/30 Days Discharge Instructions: Apply 3 multi-layer wrap as prescribed. WOUND #8: - Lower Leg Wound Laterality: Left, Posterior Cleanser: Soap and Water 1 x Per Week/30 Days Discharge Instructions: Gently cleanse wound with antibacterial soap, rinse and pat dry prior to dressing wounds Primary Dressing: Silvercel 4 1/4x 4 1/4 (in/in) 1 x Per  Week/30 Days Napp, Mija ([id number]) Discharge Instructions: Apply Silvercel 4 1/4x 4 1/4 (in/in) as instructed Secondary Dressing: ABD Pad 5x9 (in/in) 1 x Per Week/30 Days Discharge Instructions: Cover with ABD pad Compression Wrap: Profore Lite LF 3 Multilayer Compression Bandaging System 1 x Per Week/30 Days Discharge Instructions: Apply 3 multi-layer wrap as prescribed. Compression Stockings: Circaid Juxta Lite Compression Wrap Compression Amount: 30-40 mmHg (left) Discharge Instructions: Apply Circaid Juxta Lite Compression Wrap as directed 1. Would recommend currently that we go ahead and continue with the wound care measures as before and the patient is in agreement with plan. This includes the use of the silver alginate dressing which I think is doing a great job and overall I think that we are getting close to complete closure which is great news. 2. I am also can recommend that we continue with the 3 layer compression wrap which I think is doing awesome on this left leg. 3. I am  also can recommend the juxta lite compression wrap on the right side as well. We will see patient back for reevaluation in 1 week here in the clinic. If anything worsens or changes patient will contact our office for additional recommendations. Electronic Signature(s) Signed: 11/04/2020 3:44:59 PM By: [name] Keeler PA-C Entered By: [name] on 11/04/2020 15:44:59 [place], [name] ([id number]) -------------------------------------------------------------------------------- SuperBill Details Patient Name: [name] Date of Service: 11/04/2020 Medical Record Number: [id number] Patient Account Number: [id number] Date of Birth/Sex: Aug 12, 1965 (55 y.o. F) Treating RN: [name] Primary Care Provider: Laurance [name] Other Clinician: Referring Provider: Laurance [name] Treating Provider/Extender: Jeri Cos Weeks in Treatment: 5 Diagnosis Coding ICD-10 Codes Code Description I87.2 Venous  insufficiency (chronic) (peripheral) I89.0 Lymphedema, not elsewhere classified L97.822 Non-pressure chronic ulcer of other part of left lower leg with fat layer exposed E11.622 Type 2 diabetes mellitus with other skin ulcer N18.30 Chronic kidney disease, stage 3 unspecified I10 Essential (primary) hypertension I50.42 Chronic combined systolic (congestive) and diastolic (congestive) heart failure Facility Procedures CPT4 Code: 01007121 Description: (Facility Use Only) [phone] - [place] LWR LT LEG Modifier: Quantity: 1 Physician Procedures CPT4 Code: [id number] Description: 99213 - WC PHYS LEVEL 3 - EST PT Modifier: Quantity: 1 CPT4 Code: Description: ICD-10 Diagnosis Description I87.2 Venous insufficiency (chronic) (peripheral) I89.0 Lymphedema, not elsewhere classified L97.822 Non-pressure chronic ulcer of other part of left lower leg with fat lay E11.622 Type 2 diabetes mellitus with  other skin ulcer Modifier: er exposed Quantity: Electronic Signature(s) Signed: 11/04/2020 3:45:18 PM By: [name] Keeler PA-C Entered By: [name] on 11/04/2020 15:45:17

## 2020-11-04 NOTE — Progress Notes (Addendum)
[name], [name] ([id number]) Visit Report for 11/04/2020 Arrival Information Details Patient Name: [name], [name] Date of Service: 11/04/2020 3:00 PM Medical Record Number: [id number] Patient Account Number: [id number] Date of Birth/Sex: 1965/04/16 (55 y.o. F) Treating RN: Carlene Coria Primary Care Khristen Cheyney: Laurance Flatten Other Clinician: Referring Ndeye Tenorio: Laurance Flatten Treating Makeila Yamaguchi/Extender: Skipper Cliche in Treatment: 5 Visit Information History Since Last Visit All ordered tests and consults were completed: No Patient Arrived: Ambulatory Added or deleted any medications: No Arrival Time: 15:09 Any new allergies or adverse reactions: No Accompanied By: self Had a fall or experienced change in No Transfer Assistance: None activities of daily living that may affect Patient Identification Verified: Yes risk of falls: Secondary Verification Process Completed: Yes Signs or symptoms of abuse/neglect since last visito No Patient Requires Transmission-Based Precautions: No Hospitalized since last visit: No Patient Has Alerts: No Implantable device outside of the clinic excluding No cellular tissue based products placed in the center since last visit: Has Dressing in Place as Prescribed: Yes Has Compression in Place as Prescribed: Yes Pain Present Now: No Electronic Signature(s) Signed: 11/05/2020 8:31:56 AM By: Carlene Coria RN Entered By: Carlene Coria on 11/04/2020 15:15:30 [name], [name] ([id number]) -------------------------------------------------------------------------------- Clinic Level of Care Assessment Details Patient Name: [name], [name] Date of Service: 11/04/2020 3:00 PM Medical Record Number: [id number] Patient Account Number: [id number] Date of Birth/Sex: 1965-05-09 (55 y.o. F) Treating RN: Carlene Coria Primary Care Allisa Einspahr: Laurance Flatten Other Clinician: Referring Baylea Milburn: Laurance Flatten Treating Jaia Alonge/Extender: Skipper Cliche in Treatment: 5 Clinic Level  of Care Assessment Items TOOL 1 Quantity Score []  - Use when EandM and Procedure is performed on INITIAL visit 0 ASSESSMENTS - Nursing Assessment / Reassessment []  - General Physical Exam (combine w/ comprehensive assessment (listed just below) when performed on new 0 pt. evals) []  - 0 Comprehensive Assessment (HX, ROS, Risk Assessments, Wounds Hx, etc.) ASSESSMENTS - Wound and Skin Assessment / Reassessment []  - Dermatologic / Skin Assessment (not related to wound area) 0 ASSESSMENTS - Ostomy and/or Continence Assessment and Care []  - Incontinence Assessment and Management 0 []  - 0 Ostomy Care Assessment and Management (repouching, etc.) PROCESS - Coordination of Care []  - Simple Patient / Family Education for ongoing care 0 []  - 0 Complex (extensive) Patient / Family Education for ongoing care []  - 0 Staff obtains Programmer, systems, Records, Test Results / Process Orders []  - 0 Staff telephones HHA, Nursing Homes / Clarify orders / etc []  - 0 Routine Transfer to another Facility (non-emergent condition) []  - 0 Routine Hospital Admission (non-emergent condition) []  - 0 New Admissions / Biomedical engineer / Ordering NPWT, Apligraf, etc. []  - 0 Emergency Hospital Admission (emergent condition) PROCESS - Special Needs []  - Pediatric / Minor Patient Management 0 []  - 0 Isolation Patient Management []  - 0 Hearing / Language / Visual special needs []  - 0 Assessment of Community assistance (transportation, D/C planning, etc.) []  - 0 Additional assistance / Altered mentation []  - 0 Support Surface(s) Assessment (bed, cushion, seat, etc.) INTERVENTIONS - Miscellaneous []  - External ear exam 0 []  - 0 Patient Transfer (multiple staff / Civil Service fast streamer / Similar devices) []  - 0 Simple Staple / Suture removal (25 or less) []  - 0 Complex Staple / Suture removal (26 or more) []  - 0 Hypo/Hyperglycemic Management (do not check if billed separately) []  - 0 Ankle / Brachial Index (ABI) -  do not check if billed separately Has the patient been seen at the hospital within the last three years: Yes Total  Score: 0 Level Of Care: ____ [name] ([id number]) Electronic Signature(s) Signed: 11/05/2020 8:31:56 AM By: Carlene Coria RN Entered By: Carlene Coria on 11/04/2020 15:32:51 Hice, [name] ([id number]) -------------------------------------------------------------------------------- Compression Therapy Details Patient Name: [name] Date of Service: 11/04/2020 3:00 PM Medical Record Number: [id number] Patient Account Number: [id number] Date of Birth/Sex: 10-Jun-1965 (55 y.o. F) Treating RN: Carlene Coria Primary Care Kenna Kirn: Laurance Flatten Other Clinician: Referring [name] Longie: Laurance Flatten Treating Jamarea Selner/Extender: Skipper Cliche in Treatment: 5 Compression Therapy Performed for Wound Assessment: Wound #7 Left,Anterior Lower Leg Performed By: Clinician Carlene Coria, RN Compression Type: Three Layer Post Procedure Diagnosis Same as Pre-procedure Electronic Signature(s) Signed: 11/05/2020 8:31:56 AM By: Carlene Coria RN Entered By: Carlene Coria on 11/04/2020 15:32:07 Sato, [name] ([id number]) -------------------------------------------------------------------------------- Compression Therapy Details Patient Name: [name] Date of Service: 11/04/2020 3:00 PM Medical Record Number: [id number] Patient Account Number: [id number] Date of Birth/Sex: Jan 20, 1966 (55 y.o. F) Treating RN: Carlene Coria Primary Care Angele Wiemann: Laurance Flatten Other Clinician: Referring Simar Pothier: Laurance Flatten Treating Anzal Bartnick/Extender: Skipper Cliche in Treatment: 5 Compression Therapy Performed for Wound Assessment: Wound #8 Left,Posterior Lower Leg Performed By: Clinician Carlene Coria, RN Compression Type: Three Layer Post Procedure Diagnosis Same as Pre-procedure Electronic Signature(s) Signed: 11/05/2020 8:31:56 AM By: Carlene Coria RN Entered By: Carlene Coria on 11/04/2020  15:32:07 [name], [name] ([id number]) -------------------------------------------------------------------------------- Lower Extremity Assessment Details Patient Name: [name] Date of Service: 11/04/2020 3:00 PM Medical Record Number: [id number] Patient Account Number: [id number] Date of Birth/Sex: 10/09/1965 (55 y.o. F) Treating RN: Carlene Coria Primary Care Laksh Hinners: Laurance Flatten Other Clinician: Referring Elis Sauber: Laurance Flatten Treating Basheer Molchan/Extender: Jeri Cos Weeks in Treatment: 5 Edema Assessment Assessed: [Left: No] [Right: No] Edema: [Left: Ye] [Right: s] Calf Left: Right: Point of Measurement: 37 cm From Medial Instep 48 cm Ankle Left: Right: Point of Measurement: 9 cm From Medial Instep 27 cm Vascular Assessment Pulses: Dorsalis Pedis Palpable: [Left:Yes] Electronic Signature(s) Signed: 11/05/2020 8:31:56 AM By: Carlene Coria RN Entered By: Carlene Coria on 11/04/2020 15:26:48 [name], [name] ([id number]) -------------------------------------------------------------------------------- Multi Wound Chart Details Patient Name: [name] Date of Service: 11/04/2020 3:00 PM Medical Record Number: [id number] Patient Account Number: [id number] Date of Birth/Sex: August 23, 1965 (55 y.o. F) Treating RN: Carlene Coria Primary Care Raven Furnas: Laurance Flatten Other Clinician: Referring Ariann Khaimov: Laurance Flatten Treating Iban Utz/Extender: Skipper Cliche in Treatment: 5 Vital Signs Height(in): Pulse(bpm): 44 Weight(lbs): Blood Pressure(mmHg): 152/67 Body Mass Index(BMI): Temperature(F): 98.9 Respiratory Rate(breaths/min): 18 Photos: [N/A:N/A] Wound Location: Left, Anterior Lower Leg Left, Posterior Lower Leg N/A Wounding Event: Gradually Appeared Gradually Appeared N/A Primary Etiology: Diabetic Wound/Ulcer of the Lower Diabetic Wound/Ulcer of the Lower N/A Extremity Extremity Comorbid History: Congestive Heart Failure, Coronary Congestive Heart Failure, Coronary  N/A Artery Disease, Hypertension, Artery Disease, Hypertension, Myocardial Infarction, Peripheral Myocardial Infarction, Peripheral Venous Disease, Type II Diabetes, Venous Disease, Type II Diabetes, End Stage Renal Disease, End Stage Renal Disease, Neuropathy Neuropathy Date Acquired: 09/20/2020 09/20/2020 N/A Weeks of Treatment: 5 5 N/A Wound Status: Open Open N/A Measurements L x W x D (cm) 7.2x2.5x0.1 0.5x0.3x0.1 N/A Area (cm) : 14.137 0.118 N/A Volume (cm) : 1.414 0.012 N/A % Reduction in Area: 59.10% 96.00% N/A % Reduction in Volume: 59.10% 95.90% N/A Classification: Grade 2 Grade 2 N/A Exudate Amount: Medium Medium N/A Exudate Type: Serosanguineous Serosanguineous N/A Exudate Color: red, brown red, brown N/A Granulation Amount: Medium (34-66%) Small (1-33%) N/A Granulation Quality: N/A Pink N/A Necrotic Amount: Medium (34-66%) Large (67-100%) N/A Exposed Structures: Fat Layer (Subcutaneous Tissue): Fat Layer (Subcutaneous Tissue): N/A  Yes Yes Fascia: No Fascia: No Tendon: No Tendon: No Muscle: No Muscle: No Joint: No Joint: No Bone: No Bone: No Epithelialization: None None N/A Treatment Notes Electronic Signature(s) Signed: 11/05/2020 8:31:56 AM By: Carlene Coria RN Entered By: Carlene Coria on 11/04/2020 15:31:37 [name], [name] ([id number]) GRABIELA, WOHLFORD ([id number]) -------------------------------------------------------------------------------- Multi-Disciplinary Care Plan Details Patient Name: [name] Date of Service: 11/04/2020 3:00 PM Medical Record Number: [id number] Patient Account Number: [id number] Date of Birth/Sex: 06-22-1965 (55 y.o. F) Treating RN: Carlene Coria Primary Care Novali Vollman: Laurance Flatten Other Clinician: Referring Alyzabeth Pontillo: Laurance Flatten Treating Antion Andres/Extender: Skipper Cliche in Treatment: 5 Active Inactive Abuse / Safety / Falls / Self Care Management Nursing Diagnoses: Potential for injury related to falls Goals: Patient  will remain injury free related to falls Date Initiated: 09/30/2020 Target Resolution Date: 10/31/2020 Goal Status: Active Interventions: Assess Activities of Daily Living upon admission and as needed Assess fall risk on admission and as needed Assess: immobility, friction, shearing, incontinence upon admission and as needed Assess impairment of mobility on admission and as needed per policy Assess personal safety and home safety (as indicated) on admission and as needed Assess self care needs on admission and as needed Notes: Medication Nursing Diagnoses: Knowledge deficit related to medication safety: actual or potential Goals: Patient/caregiver will demonstrate understanding of all current medications Date Initiated: 09/30/2020 Target Resolution Date: 10/31/2020 Goal Status: Active Interventions: Assess for medication contraindications each visit where new medications are prescribed Assess patient/caregiver ability to manage medication regimen upon admission and as needed Notes: Nutrition Nursing Diagnoses: Potential for alteratiion in Nutrition/Potential for imbalanced nutrition Goals: Patient/caregiver will maintain therapeutic glucose control Date Initiated: 09/30/2020 Target Resolution Date: 10/31/2020 Goal Status: Active Interventions: Assess HgA1c results as ordered upon admission and as needed Assess patient nutrition upon admission and as needed per policy Notes: Electronic Signature(s) [name], [name] ([id number]) Signed: 11/05/2020 8:31:56 AM By: Carlene Coria RN Entered By: Carlene Coria on 11/04/2020 15:31:25 [place], [name] ([id number]) -------------------------------------------------------------------------------- Pain Assessment Details Patient Name: [name] Date of Service: 11/04/2020 3:00 PM Medical Record Number: [id number] Patient Account Number: [id number] Date of Birth/Sex: [birth year] (55 y.o. F) Treating RN: Carlene Coria Primary Care Cresta Riden:  Laurance Flatten Other Clinician: Referring Lawerence Dery: Laurance Flatten Treating Robynne Roat/Extender: Skipper Cliche in Treatment: 5 Active Problems Location of Pain Severity and Description of Pain Patient Has Paino No Site Locations Pain Management and Medication Current Pain Management: Electronic Signature(s) Signed: 11/05/2020 8:31:56 AM By: Carlene Coria RN Entered By: Carlene Coria on 11/04/2020 15:16:01 [place], [name] ([id number]) -------------------------------------------------------------------------------- Patient/Caregiver Education Details Patient Name: [name] Date of Service: 11/04/2020 3:00 PM Medical Record Number: [id number] Patient Account Number: [id number] Date of Birth/Gender: [birth year] (55 y.o. F) Treating RN: Carlene Coria Primary Care Physician: Laurance Flatten Other Clinician: Referring Physician: Laurance Flatten Treating Physician/Extender: Skipper Cliche in Treatment: 5 Education Assessment Education Provided To: Patient Education Topics Provided Wound/Skin Impairment: Methods: Explain/Verbal Responses: State content correctly Electronic Signature(s) Signed: 11/05/2020 8:31:56 AM By: Carlene Coria RN Entered By: Carlene Coria on 11/04/2020 15:33:14 [name], [name] ([id number]) -------------------------------------------------------------------------------- Wound Assessment Details Patient Name: [name] Date of Service: 11/04/2020 3:00 PM Medical Record Number: [id number] Patient Account Number: [id number] Date of Birth/Sex: 02/27/1966 (55 y.o. F) Treating RN: Carlene Coria Primary Care Nakiah Osgood: Laurance Flatten Other Clinician: Referring Rathana Viveros: Laurance Flatten Treating Coreyon Nicotra/Extender: Jeri Cos Weeks in Treatment: 5 Wound Status Wound Number: 7 Primary Diabetic Wound/Ulcer of the Lower Extremity Etiology: Wound Location: Left, Anterior Lower Leg Wound Open Wounding Event: Gradually Appeared Status: Date Acquired:  09/20/2020 Comorbid Congestive Heart  Failure, Coronary Artery Disease, Weeks Of Treatment: 5 History: Hypertension, Myocardial Infarction, Peripheral Venous Clustered Wound: No Disease, Type II Diabetes, End Stage Renal Disease, Neuropathy Photos Wound Measurements Length: (cm) 7.2 Width: (cm) 2.5 Depth: (cm) 0.1 Area: (cm) 14.137 Volume: (cm) 1.414 % Reduction in Area: 59.1% % Reduction in Volume: 59.1% Epithelialization: None Tunneling: No Undermining: No Wound Description Classification: Grade 2 Exudate Amount: Medium Exudate Type: Serosanguineous Exudate Color: red, brown Foul Odor After Cleansing: No Wound Bed Granulation Amount: Medium (34-66%) Exposed Structure Necrotic Amount: Medium (34-66%) Fascia Exposed: No Necrotic Quality: Adherent Slough Fat Layer (Subcutaneous Tissue) Exposed: Yes Tendon Exposed: No Muscle Exposed: No Joint Exposed: No Bone Exposed: No Electronic Signature(s) Signed: 11/05/2020 8:31:56 AM By: Carlene Coria RN Entered By: Carlene Coria on 11/04/2020 15:26:00 Tortorella, [name] ([id number]) -------------------------------------------------------------------------------- Wound Assessment Details Patient Name: [name] Date of Service: 11/04/2020 3:00 PM Medical Record Number: [id number] Patient Account Number: [id number] Date of Birth/Sex: [birth year] (55 y.o. F) Treating RN: Carlene Coria Primary Care Arraya Buck: Laurance Flatten Other Clinician: Referring Trei Schoch: Laurance Flatten Treating Janete Quilling/Extender: Jeri Cos Weeks in Treatment: 5 Wound Status Wound Number: 8 Primary Diabetic Wound/Ulcer of the Lower Extremity Etiology: Wound Location: Left, Posterior Lower Leg Wound Open Wounding Event: Gradually Appeared Status: Date Acquired: 09/20/2020 Comorbid Congestive Heart Failure, Coronary Artery Disease, Weeks Of Treatment: 5 History: Hypertension, Myocardial Infarction, Peripheral Venous Clustered Wound: No Disease, Type II Diabetes, End Stage Renal  Disease, Neuropathy Photos Wound Measurements Length: (cm) 0.5 Width: (cm) 0.3 Depth: (cm) 0.1 Area: (cm) 0.118 Volume: (cm) 0.012 % Reduction in Area: 96% % Reduction in Volume: 95.9% Epithelialization: None Tunneling: No Undermining: No Wound Description Classification: Grade 2 Exudate Amount: Medium Exudate Type: Serosanguineous Exudate Color: red, brown Foul Odor After Cleansing: No Slough/Fibrino Yes Wound Bed Granulation Amount: Small (1-33%) Exposed Structure Granulation Quality: Pink Fascia Exposed: No Necrotic Amount: Large (67-100%) Fat Layer (Subcutaneous Tissue) Exposed: Yes Necrotic Quality: Adherent Slough Tendon Exposed: No Muscle Exposed: No Joint Exposed: No Bone Exposed: No Electronic Signature(s) Signed: 11/05/2020 8:31:56 AM By: Carlene Coria RN Entered By: Carlene Coria on 11/04/2020 15:26:26 [name], [name] ([id number]) -------------------------------------------------------------------------------- Vitals Details Patient Name: [name] Date of Service: 11/04/2020 3:00 PM Medical Record Number: [id number] Patient Account Number: [id number] Date of Birth/Sex: 1966/03/23 (55 y.o. F) Treating RN: Carlene Coria Primary Care Jazalyn Mondor: Laurance Flatten Other Clinician: Referring Mia Winthrop: Laurance Flatten Treating Delorus Langwell/Extender: Jeri Cos Weeks in Treatment: 5 Vital Signs Time Taken: 15:15 Temperature (F): 98.9 Pulse (bpm): 84 Respiratory Rate (breaths/min): 18 Blood Pressure (mmHg): 152/67 Reference Range: 80 - 120 mg / dl Electronic Signature(s) Signed: 11/05/2020 8:31:56 AM By: Carlene Coria RN Entered By: Carlene Coria on 11/04/2020 15:15:54

## 2020-11-11 ENCOUNTER — Encounter: Payer: Medicare Other | Admitting: Physician Assistant

## 2020-11-14 ENCOUNTER — Encounter (HOSPITAL_COMMUNITY): Payer: Medicare Other | Admitting: Cardiology

## 2020-11-15 ENCOUNTER — Ambulatory Visit: Payer: Medicare Other | Admitting: Podiatry

## 2020-11-18 ENCOUNTER — Other Ambulatory Visit: Payer: Self-pay

## 2020-11-18 ENCOUNTER — Encounter: Payer: Medicare Other | Admitting: Physician Assistant

## 2020-11-18 DIAGNOSIS — E11622 Type 2 diabetes mellitus with other skin ulcer: Secondary | ICD-10-CM | POA: Diagnosis not present

## 2020-11-18 NOTE — Progress Notes (Addendum)
TEMPESTT, SILBA ([id number]) Visit Report for 11/18/2020 Arrival Information Details Patient Name: [name], [name] Date of Service: 11/18/2020 3:00 PM Medical Record Number: [id number] Patient Account Number: [id number] Date of Birth/Sex: [birth year] (55 y.o. F) Treating RN: Carlene Coria Primary Care Granvel Proudfoot: Laurance Flatten Other Clinician: Referring Carin Shipp: Laurance Flatten Treating Jonathan Kirkendoll/Extender: Skipper Cliche in Treatment: 7 Visit Information History Since Last Visit All ordered tests and consults were completed: No Patient Arrived: Ambulatory Added or deleted any medications: No Arrival Time: 15:10 Any new allergies or adverse reactions: No Accompanied By: self Had a fall or experienced change in No Transfer Assistance: None activities of daily living that may affect Patient Identification Verified: Yes risk of falls: Secondary Verification Process Completed: Yes Signs or symptoms of abuse/neglect since last visito No Patient Requires Transmission-Based Precautions: No Hospitalized since last visit: No Patient Has Alerts: No Implantable device outside of the clinic excluding No cellular tissue based products placed in the center since last visit: Has Dressing in Place as Prescribed: Yes Has Compression in Place as Prescribed: Yes Pain Present Now: No Electronic Signature(s) Signed: 11/20/2020 7:55:31 AM By: Carlene Coria RN Entered By: Carlene Coria on 11/18/2020 15:11:03 Maddison, Jameah ([id number]) -------------------------------------------------------------------------------- Clinic Level of Care Assessment Details Patient Name: [name], [name] Date of Service: 11/18/2020 3:00 PM Medical Record Number: [id number] Patient Account Number: [id number] Date of Birth/Sex: 10/16/1965 (55 y.o. F) Treating RN: Carlene Coria Primary Care Kearia Yin: Laurance Flatten Other Clinician: Referring Jedi Catalfamo: Laurance Flatten Treating Tahira Olivarez/Extender: Skipper Cliche in Treatment: 7 Clinic Level  of Care Assessment Items TOOL 1 Quantity Score []  - Use when EandM and Procedure is performed on INITIAL visit 0 ASSESSMENTS - Nursing Assessment / Reassessment []  - General Physical Exam (combine w/ comprehensive assessment (listed just below) when performed on new 0 pt. evals) []  - 0 Comprehensive Assessment (HX, ROS, Risk Assessments, Wounds Hx, etc.) ASSESSMENTS - Wound and Skin Assessment / Reassessment []  - Dermatologic / Skin Assessment (not related to wound area) 0 ASSESSMENTS - Ostomy and/or Continence Assessment and Care []  - Incontinence Assessment and Management 0 []  - 0 Ostomy Care Assessment and Management (repouching, etc.) PROCESS - Coordination of Care []  - Simple Patient / Family Education for ongoing care 0 []  - 0 Complex (extensive) Patient / Family Education for ongoing care []  - 0 Staff obtains Programmer, systems, Records, Test Results / Process Orders []  - 0 Staff telephones HHA, Nursing Homes / Clarify orders / etc []  - 0 Routine Transfer to another Facility (non-emergent condition) []  - 0 Routine Hospital Admission (non-emergent condition) []  - 0 New Admissions / Biomedical engineer / Ordering NPWT, Apligraf, etc. []  - 0 Emergency Hospital Admission (emergent condition) PROCESS - Special Needs []  - Pediatric / Minor Patient Management 0 []  - 0 Isolation Patient Management []  - 0 Hearing / Language / Visual special needs []  - 0 Assessment of Community assistance (transportation, D/C planning, etc.) []  - 0 Additional assistance / Altered mentation []  - 0 Support Surface(s) Assessment (bed, cushion, seat, etc.) INTERVENTIONS - Miscellaneous []  - External ear exam 0 []  - 0 Patient Transfer (multiple staff / Civil Service fast streamer / Similar devices) []  - 0 Simple Staple / Suture removal (25 or less) []  - 0 Complex Staple / Suture removal (26 or more) []  - 0 Hypo/Hyperglycemic Management (do not check if billed separately) []  - 0 Ankle / Brachial Index (ABI) -  do not check if billed separately Has the patient been seen at the hospital within the last three years: Yes Total  Score: 0 Level Of Care: ____ [name] ([id number]) Electronic Signature(s) Signed: 11/20/2020 7:55:31 AM By: Carlene Coria RN Entered By: Carlene Coria on 11/18/2020 15:28:34 Scalia, Lailoni ([id number]) -------------------------------------------------------------------------------- Compression Therapy Details Patient Name: [name] Date of Service: 11/18/2020 3:00 PM Medical Record Number: [id number] Patient Account Number: [id number] Date of Birth/Sex: 1965/11/01 (55 y.o. F) Treating RN: Carlene Coria Primary Care Deliah Strehlow: Laurance Flatten Other Clinician: Referring Carline Dura: Laurance Flatten Treating Bradan Congrove/Extender: Skipper Cliche in Treatment: 7 Compression Therapy Performed for Wound Assessment: Wound #7 Left,Anterior Lower Leg Performed By: Clinician Carlene Coria, RN Compression Type: Three Layer Post Procedure Diagnosis Same as Pre-procedure Electronic Signature(s) Signed: 11/20/2020 7:55:31 AM By: Carlene Coria RN Entered By: Carlene Coria on 11/18/2020 15:27:26 Zapf, Airis ([id number]) -------------------------------------------------------------------------------- Compression Therapy Details Patient Name: [name] Date of Service: 11/18/2020 3:00 PM Medical Record Number: [id number] Patient Account Number: [id number] Date of Birth/Sex: Nov 07, 1965 (55 y.o. F) Treating RN: Carlene Coria Primary Care Junia Nygren: Laurance Flatten Other Clinician: Referring Ronan Dion: Laurance Flatten Treating Taressa Rauh/Extender: Skipper Cliche in Treatment: 7 Compression Therapy Performed for Wound Assessment: Wound #8 Left,Posterior Lower Leg Performed By: Clinician Carlene Coria, RN Compression Type: Three Layer Post Procedure Diagnosis Same as Pre-procedure Electronic Signature(s) Signed: 11/20/2020 7:55:31 AM By: Carlene Coria RN Entered By: Carlene Coria on 11/18/2020  15:27:26 Minnifield, Alianah ([id number]) -------------------------------------------------------------------------------- Encounter Discharge Information Details Patient Name: [name] Date of Service: 11/18/2020 3:00 PM Medical Record Number: [id number] Patient Account Number: [id number] Date of Birth/Sex: [birth year] (55 y.o. F) Treating RN: Carlene Coria Primary Care Chrisanne Loose: Laurance Flatten Other Clinician: Referring Floyd Lusignan: Laurance Flatten Treating Japneet Staggs/Extender: Skipper Cliche in Treatment: 7 Encounter Discharge Information Items Discharge Condition: Stable Ambulatory Status: Ambulatory Discharge Destination: Home Transportation: Private Auto Accompanied By: self Schedule Follow-up Appointment: Yes Clinical Summary of Care: Patient Declined Electronic Signature(s) Signed: 11/20/2020 7:55:31 AM By: Carlene Coria RN Entered By: Carlene Coria on 11/18/2020 15:29:35 Harmes, Keiyana ([id number]) -------------------------------------------------------------------------------- Lower Extremity Assessment Details Patient Name: [name] Date of Service: 11/18/2020 3:00 PM Medical Record Number: [id number] Patient Account Number: [id number] Date of Birth/Sex: 1965-12-16 (55 y.o. F) Treating RN: Carlene Coria Primary Care Mykela Mewborn: Laurance Flatten Other Clinician: Referring Keriana Sarsfield: Laurance Flatten Treating Rosaelena Kemnitz/Extender: Jeri Cos Weeks in Treatment: 7 Edema Assessment Assessed: [Left: No] [Right: No] Edema: [Left: Ye] [Right: s] Calf Left: Right: Point of Measurement: 37 cm From Medial Instep 49 cm Ankle Left: Right: Point of Measurement: 9 cm From Medial Instep 27 cm Vascular Assessment Pulses: Dorsalis Pedis Palpable: [Left:Yes] Electronic Signature(s) Signed: 11/20/2020 7:55:31 AM By: Carlene Coria RN Entered By: Carlene Coria on 11/18/2020 15:22:08 Alia, Linlee ([id number]) -------------------------------------------------------------------------------- Multi Wound  Chart Details Patient Name: [name] Date of Service: 11/18/2020 3:00 PM Medical Record Number: [id number] Patient Account Number: [id number] Date of Birth/Sex: [birth year] (55 y.o. F) Treating RN: Carlene Coria Primary Care Brendalyn Vallely: Laurance Flatten Other Clinician: Referring Shira Bobst: Laurance Flatten Treating Annalisse Minkoff/Extender: Skipper Cliche in Treatment: 7 Vital Signs Height(in): Pulse(bpm): 24 Weight(lbs): Blood Pressure(mmHg): 146/88 Body Mass Index(BMI): Temperature(F): 100.7 Respiratory Rate(breaths/min): 18 Photos: [N/A:N/A] Wound Location: Left, Anterior Lower Leg Left, Posterior Lower Leg N/A Wounding Event: Gradually Appeared Gradually Appeared N/A Primary Etiology: Diabetic Wound/Ulcer of the Lower Diabetic Wound/Ulcer of the Lower N/A Extremity Extremity Comorbid History: Congestive Heart Failure, Coronary Congestive Heart Failure, Coronary N/A Artery Disease, Hypertension, Artery Disease, Hypertension, Myocardial Infarction, Peripheral Myocardial Infarction, Peripheral Venous Disease, Type II Diabetes, Venous Disease, Type II Diabetes, End Stage Renal Disease, End Stage Renal Disease, Neuropathy Neuropathy Date Acquired: 09/20/2020  09/20/2020 N/A Weeks of Treatment: 7 7 N/A Wound Status: Open Open N/A Measurements L x W x D (cm) 5x2.5x0.1 2.2x1x0.1 N/A Area (cm) : 9.817 1.728 N/A Volume (cm) : 0.982 0.173 N/A % Reduction in Area: 71.60% 41.30% N/A % Reduction in Volume: 71.60% 41.40% N/A Classification: Grade 2 Grade 2 N/A Exudate Amount: Medium Medium N/A Exudate Type: Serosanguineous Serosanguineous N/A Exudate Color: red, brown red, brown N/A Granulation Amount: Medium (34-66%) Small (1-33%) N/A Granulation Quality: N/A Pink N/A Necrotic Amount: Medium (34-66%) Large (67-100%) N/A Exposed Structures: Fat Layer (Subcutaneous Tissue): Fat Layer (Subcutaneous Tissue): N/A Yes Yes Fascia: No Fascia: No Tendon: No Tendon: No Muscle: No Muscle: No Joint:  No Joint: No Bone: No Bone: No Epithelialization: None None N/A Treatment Notes Electronic Signature(s) Signed: 11/20/2020 7:55:31 AM By: Carlene Coria RN Entered By: Carlene Coria on 11/18/2020 15:26:57 Feeley, Jaelle ([id number]) LANAYAH, GARTLEY ([id number]) -------------------------------------------------------------------------------- Multi-Disciplinary Care Plan Details Patient Name: [name] Date of Service: 11/18/2020 3:00 PM Medical Record Number: [id number] Patient Account Number: [id number] Date of Birth/Sex: [birth year] (55 y.o. F) Treating RN: Carlene Coria Primary Care Roshanna Cimino: Laurance Flatten Other Clinician: Referring Elgar Scoggins: Laurance Flatten Treating Daelan Gatt/Extender: Skipper Cliche in Treatment: 7 Active Inactive Abuse / Safety / Falls / Self Care Management Nursing Diagnoses: Potential for injury related to falls Goals: Patient will remain injury free related to falls Date Initiated: 09/30/2020 Target Resolution Date: 10/31/2020 Goal Status: Active Interventions: Assess Activities of Daily Living upon admission and as needed Assess fall risk on admission and as needed Assess: immobility, friction, shearing, incontinence upon admission and as needed Assess impairment of mobility on admission and as needed per policy Assess personal safety and home safety (as indicated) on admission and as needed Assess self care needs on admission and as needed Notes: Electronic Signature(s) Signed: 11/20/2020 7:55:31 AM By: Carlene Coria RN Entered By: Carlene Coria on 11/18/2020 15:26:40 Gelinas, Rheagan ([id number]) -------------------------------------------------------------------------------- Pain Assessment Details Patient Name: [name] Date of Service: 11/18/2020 3:00 PM Medical Record Number: [id number] Patient Account Number: [id number] Date of Birth/Sex: 1965/08/07 (55 y.o. F) Treating RN: Carlene Coria Primary Care Abrina Petz: Laurance Flatten Other  Clinician: Referring Nevaan Bunton: Laurance Flatten Treating Lonza Shimabukuro/Extender: Skipper Cliche in Treatment: 7 Active Problems Location of Pain Severity and Description of Pain Patient Has Paino No Site Locations Pain Management and Medication Current Pain Management: Electronic Signature(s) Signed: 11/20/2020 7:55:31 AM By: Carlene Coria RN Entered By: Carlene Coria on 11/18/2020 15:11:51 Claxton, [name] ([id number]) -------------------------------------------------------------------------------- Patient/Caregiver Education Details Patient Name: [name] Date of Service: 11/18/2020 3:00 PM Medical Record Number: [id number] Patient Account Number: [id number] Date of Birth/Gender: 08-Jan-1966 (55 y.o. F) Treating RN: Carlene Coria Primary Care Physician: Laurance Flatten Other Clinician: Referring Physician: Laurance Flatten Treating Physician/Extender: Skipper Cliche in Treatment: 7 Education Assessment Education Provided To: Patient Education Topics Provided Wound/Skin Impairment: Methods: Explain/Verbal Responses: State content correctly Electronic Signature(s) Signed: 11/20/2020 7:55:31 AM By: Carlene Coria RN Entered By: Carlene Coria on 11/18/2020 15:28:56 Rusconi, Shama ([id number]) -------------------------------------------------------------------------------- Wound Assessment Details Patient Name: [name] Date of Service: 11/18/2020 3:00 PM Medical Record Number: [id number] Patient Account Number: [id number] Date of Birth/Sex: Apr 03, 1965 (55 y.o. F) Treating RN: Carlene Coria Primary Care Jajuan Skoog: Laurance Flatten Other Clinician: Referring Briony Parveen: Laurance Flatten Treating Lonie Newsham/Extender: Jeri Cos Weeks in Treatment: 7 Wound Status Wound Number: 7 Primary Diabetic Wound/Ulcer of the Lower Extremity Etiology: Wound Location: Left, Anterior Lower Leg Wound Open Wounding Event: Gradually Appeared Status: Date Acquired: 09/20/2020 Comorbid Congestive Heart Failure, Coronary  Artery Disease, Weeks Of Treatment: 7 History: Hypertension, Myocardial Infarction, Peripheral Venous Clustered Wound: No Disease, Type II Diabetes, End Stage Renal Disease, Neuropathy Photos Wound Measurements Length: (cm) 5 Width: (cm) 2.5 Depth: (cm) 0.1 Area: (cm) 9.817 Volume: (cm) 0.982 % Reduction in Area: 71.6% % Reduction in Volume: 71.6% Epithelialization: None Tunneling: No Undermining: No Wound Description Classification: Grade 2 Exudate Amount: Medium Exudate Type: Serosanguineous Exudate Color: red, brown Foul Odor After Cleansing: No Wound Bed Granulation Amount: Medium (34-66%) Exposed Structure Necrotic Amount: Medium (34-66%) Fascia Exposed: No Necrotic Quality: Adherent Slough Fat Layer (Subcutaneous Tissue) Exposed: Yes Tendon Exposed: No Muscle Exposed: No Joint Exposed: No Bone Exposed: No Treatment Notes Wound #7 (Lower Leg) Wound Laterality: Left, Anterior Cleanser Soap and Water Discharge Instruction: Gently cleanse wound with antibacterial soap, rinse and pat dry prior to dressing wounds Peri-Wound Care Adderley, Loyce ([id number]) Topical Primary Dressing Silvercel 4 1/4x 4 1/4 (in/in) Discharge Instruction: Apply Silvercel 4 1/4x 4 1/4 (in/in) as instructed Secondary Dressing ABD Pad 5x9 (in/in) Discharge Instruction: Cover with ABD pad Secured With Compression Wrap Profore Lite LF 3 Multilayer Compression Bandaging System Discharge Instruction: Apply 3 multi-layer wrap as prescribed. Compression Stockings Add-Ons Electronic Signature(s) Signed: 11/20/2020 7:55:31 AM By: Carlene Coria RN Entered By: Carlene Coria on 11/18/2020 15:20:04 Bartle, [name] ([id number]) -------------------------------------------------------------------------------- Wound Assessment Details Patient Name: [name] Date of Service: 11/18/2020 3:00 PM Medical Record Number: [id number] Patient Account Number: [id number] Date of Birth/Sex: 05/11/1965 (55  y.o. F) Treating RN: Carlene Coria Primary Care Brynn Mulgrew: Laurance Flatten Other Clinician: Referring Alesa Echevarria: Laurance Flatten Treating Florinda Taflinger/Extender: Jeri Cos Weeks in Treatment: 7 Wound Status Wound Number: 8 Primary Diabetic Wound/Ulcer of the Lower Extremity Etiology: Wound Location: Left, Posterior Lower Leg Wound Open Wounding Event: Gradually Appeared Status: Date Acquired: 09/20/2020 Comorbid Congestive Heart Failure, Coronary Artery Disease, Weeks Of Treatment: 7 History: Hypertension, Myocardial Infarction, Peripheral Venous Clustered Wound: No Disease, Type II Diabetes, End Stage Renal Disease, Neuropathy Photos Wound Measurements Length: (cm) 2.2 Width: (cm) 1 Depth: (cm) 0.1 Area: (cm) 1.728 Volume: (cm) 0.173 % Reduction in Area: 41.3% % Reduction in Volume: 41.4% Epithelialization: None Tunneling: No Undermining: No Wound Description Classification: Grade 2 Exudate Amount: Medium Exudate Type: Serosanguineous Exudate Color: red, brown Foul Odor After Cleansing: No Slough/Fibrino Yes Wound Bed Granulation Amount: Small (1-33%) Exposed Structure Granulation Quality: Pink Fascia Exposed: No Necrotic Amount: Large (67-100%) Fat Layer (Subcutaneous Tissue) Exposed: Yes Necrotic Quality: Adherent Slough Tendon Exposed: No Muscle Exposed: No Joint Exposed: No Bone Exposed: No Treatment Notes Wound #8 (Lower Leg) Wound Laterality: Left, Posterior Cleanser Soap and Water Discharge Instruction: Gently cleanse wound with antibacterial soap, rinse and pat dry prior to dressing wounds Peri-Wound Care Karg, Nerea ([id number]) Topical Primary Dressing Silvercel 4 1/4x 4 1/4 (in/in) Discharge Instruction: Apply Silvercel 4 1/4x 4 1/4 (in/in) as instructed Secondary Dressing ABD Pad 5x9 (in/in) Discharge Instruction: Cover with ABD pad Secured With Compression Wrap Profore Lite LF 3 Multilayer Compression Bandaging System Discharge Instruction: Apply  3 multi-layer wrap as prescribed. Compression Stockings Circaid Juxta Lite Compression Wrap Quantity: 1 Left Leg Compression Amount: 30-40 mmHg Discharge Instruction: Apply Circaid Juxta Lite Compression Wrap as directed Add-Ons Electronic Signature(s) Signed: 11/20/2020 7:55:31 AM By: Carlene Coria RN Entered By: Carlene Coria on 11/18/2020 15:21:05 Yoshida, Maitri ([id number]) -------------------------------------------------------------------------------- Vitals Details Patient Name: [name] Date of Service: 11/18/2020 3:00 PM Medical Record Number: [id number] Patient Account Number: [id number] Date of Birth/Sex: [birth year] (55 y.o. F) Treating RN: Carlene Coria Primary Care  Addalyn Speedy: Laurance Flatten Other Clinician: Referring Ginna Schuur: Laurance Flatten Treating Katyra Tomassetti/Extender: Jeri Cos Weeks in Treatment: 7 Vital Signs Time Taken: 15:11 Temperature (F): 100.7 Pulse (bpm): 68 Respiratory Rate (breaths/min): 18 Blood Pressure (mmHg): 146/88 Reference Range: 80 - 120 mg / dl Electronic Signature(s) Signed: 11/20/2020 7:55:31 AM By: Carlene Coria RN Entered By: Carlene Coria on 11/18/2020 15:11:39

## 2020-11-18 NOTE — Progress Notes (Addendum)
GISSELLA, NIBLACK ([id number]) Visit Report for 11/18/2020 Chief Complaint Document Details Patient Name: [name], [name] Date of Service: 11/18/2020 3:00 PM Medical Record Number: [id number] Patient Account Number: [id number] Date of Birth/Sex: 13-Jul-1965 (55 y.o. F) Treating RN: Carlene Coria Primary Care Provider: Laurance Flatten Other Clinician: Referring Provider: Laurance Flatten Treating Provider/Extender: Skipper Cliche in Treatment: 7 Information Obtained from: Patient Chief Complaint Left LE Ulcer Electronic Signature(s) Signed: 11/18/2020 3:08:59 PM By: Worthy Keeler PA-C Entered By: Worthy Keeler on 11/18/2020 15:08:59 Seger, Myka ([id number]) -------------------------------------------------------------------------------- HPI Details Patient Name: [name] Date of Service: 11/18/2020 3:00 PM Medical Record Number: [id number] Patient Account Number: [id number] Date of Birth/Sex: 1965/10/03 (55 y.o. F) Treating RN: Carlene Coria Primary Care Provider: Laurance Flatten Other Clinician: Referring Provider: Laurance Flatten Treating Provider/Extender: Skipper Cliche in Treatment: 7 History of Present Illness HPI Description: 10/24/2018 on evaluation today patient presents for initial evaluation or clinic concerning issues that she has been having with lymphedema for quite some time. Unfortunately she has several wound openings at this point that secondary to her lymphedema/venous stasis are giving her trouble and leaking quite severely. She also has diabetes along with hypertension and stage III chronic kidney disease. Fortunately there is no signs of active infection at this time. She is going to likely require debridement of the left leg ulcer upon evaluation today just based on what I am seeing. Fortunately there is no wound opening on the right. Overall the patient seems to be doing quite well and again there is no evidence of systemic infection which is good news. No fevers, chills,  nausea, vomiting, or diarrhea. 10/31/2018 on evaluation today patient actually appears to be doing excellent in regard to her lower extremity ulcers. She has been tolerating the dressing changes without complication. Fortunately there is no signs of active infection at this time. She has tolerated 3 layer compression wrap without complication. 11/07/2018 upon evaluation today patient actually appears to be doing very well with regard to her left lower extremity ulcers. She has been tolerating the dressing changes without complication. Fortunately there is no signs of active infection. No fevers, chills, nausea, vomiting, or diarrhea. 11/21/2018 upon evaluation today patient appears to be doing quite well with regard to her lower extremity ulcers. In fact both areas seem to be showing signs of good improvement which is excellent. She is not having as much pain as she has in the past and again has a lot of healing compared to previous visits as well. 12/05/2018 on evaluation today patient presents for follow-up concerning her ongoing issues with her bilateral lower extremity ulcers. The good news is her right lower extremity is showing signs of completely healing at this time which is great news. Fortunately there is no evidence of active infection. On the left she has just a very small area still remaining that is open at this time all in all she is very close to complete closure in my opinion. She does have compression stockings to wear at home. 12/12/2018 on evaluation today patient appears to be doing excellent in regard to her left lateral lower extremity ulcer. She has been tolerating the dressing changes without complication. Fortunately there is no signs of active infection at this time. In fact this appears to be pretty much healed at this point although again she is not 100% today. No fevers, chills, nausea, vomiting, or diarrhea. 12/19/2018 on evaluation today patient actually appears to be doing  excellent with regard to her lower extremity ulcer  in fact this appears to be completely healed today which is all some. She has done extremely well with wound care measures. No fevers, chills, nausea, vomiting, or diarrhea. ------------------------------------ 11/01/19-Readmission to the clinic Patient presents with left leg pain, wounds posterior calf, onset about 4 weeks, denies any fevers chills or shakes, has not been using anything to these wounds, was given compression stockings at last discharge from the clinic but has not been able to use them as they rolled down and cause creases Patient's history significant for type 2 diabetes insulin requiring, A1c of 6.9 lately, hypertension, chronic pain 8/18; patient readmitted that the clinic last week. She has wounds on her left lateral posterior lower leg all of this in close juxtaposition i.e. a localized site. We've been using silver alginate under 3 layer compression apparently the wound surface area is better. She was wearing compression stockings from elastic therapy but says they were falling down. As she progresses more towards healing will need to address what we use in terms of compression stockings perhaps external compression garments 11/17/2019 on evaluation today patient appears to be doing well with regard to her legs currently. She does have 2 wounds which are actually measuring much better the more posterior is actually doing very well which I am pleased with the other though smaller is not quite a small but nonetheless on the lateral aspect does seem to be improving. 11/23/2019 on evaluation today patient appears to be doing well in regard to her wounds. In fact the wound on the posterior aspect of her leg appears healed the lateral aspect is still open but extremely small. 9/15; this is a patient with chronic venous insufficiency and secondary lymphedema. She did not keep her clinic appointment last week and hence the left leg is a  lot more swollen since she had to take the wrap off at some point. She has a small weeping area on the left lateral lower leg. She has probably a juxta lite stocking for the left leg I have asked her to bring that in when she comes to her clinic appointment next week at which time she should be healed 10/6; this is a patient with chronic venous insufficiency and secondary lymphedema left greater than right. Skin changes of chronic lymphedema especially in the left leg. She has not been here in 3 weeks. She took the wrap off 2 weeks ago. She has some very old 20/30 stockings from elastic therapy in [place] she has been using. Fortunately her leg is closed. She also has a single [place] wrap for the left leg Readmission: 06/18/2020 upon evaluation today patient presents for reevaluation here in the clinic. She is a long-term patient that we have seen intermittently when she has had flareups of issues with her lower extremity secondary to chronic venous stasis. With that being said unfortunately today she is having a significant issue with a wound which is actually quite significant over the right lower extremity. She did see dermatology they put Vaseline followed by [place] dressings on her unfortunately she is continuing to have significant issues however here with pain and in fact this wrap was extremely stuck to her upon evaluation today. Nonetheless I believe that she is going to require a little bit different approach to try to keep things from sticking and hopefully aid in getting this to heal more effectively and quickly. With that being said she does have a history of Cashin, Pati ([id number]) chronic venous insufficiency, lymphedema, diabetes mellitus type 2,  chronic kidney disease stage III, hypertension, and congestive heart failure. This started as an injury in mid February and has worsened over the past 2 weeks. 07/04/2020 upon evaluation today patient appears to be doing well with  regard to her wound on the leg. Overall I am extremely pleased with where things stand and I do think that she is making excellent progress. There is no sign of infection right now which is also great news. I think that she is close to complete resolution. 07/09/20 upon evaluation today patient appears to be doing excellent in regard to her leg ulcer. She has been tolerating the dressing changes without complication and in general I am extremely pleased with where things stand today. There does not appear to be any signs of active infection at this time which is great news. 07/25/2020 upon evaluation today patient appears to be doing well with regard to her wound. She has been tolerating the dressing changes without complication. Fortunately there is no signs of active infection at this time and her wound appears to be completely healed. Readmission: 09/30/2020 upon evaluation today patient appears for reevaluation here in our clinic. She was discharged May 5 that unfortunately has started to have weeping and wounds on the left leg at this point. She tells me the right leg did weep some but nothing as significant as the left. With all that being said at this point the patient states that she figured it was best to come in here and she was having a lot of drainage and it was get all of her sheets and everything. She has been wearing her Velcro wraps sometimes but it does not sound like she is been wearing them all the time which I think is part of the reason why this reopened. She also unfortunately had a fall which was quite significant. This occurred I believe about a week or 2 ago. Otherwise patient's past medical history really has not changed significantly. 10/07/2020 upon evaluation today patient appears to be doing better in regard to her wounds of the lower extremities. Fortunately there does not appear to be any signs of active infection at this time which is great news. I do not see any evidence  of anything worsening but again she continues to have significant bilateral stage III lymphedema. This has been an ongoing issue. We actually have seen her this year pretty much from March through May of 2022. Following this she continued with compression at home following but then unfortunately reopened again. That was on July 11. With that being said this is an ongoing and recurrent issue for which honestly have seen her over the past 2 years pretty much consistently. Nonetheless we have had Velcro compression wraps that she uses at home and despite this she continues to have frequent and recurrent ulcerations. 7/27; patient presents for 1 week follow-up. She has no issues or complaints today. She denies signs of infection. 10/21/2020 upon evaluation today patient's leg actually appears to be doing decently well on the left there is nothing open on the right at all and I am pleased in that regard. On the left side I do think that she still has some areas of weeping I believe the compression wrap is still the best way to go although I am hopeful that we are headed in the right direction as far as getting this area to heal and silver. Upon inspection patient's wound bed actually showed signs of good granulation epithelization at this point. There does  not appear to be any evidence of active infection which is great news and overall I am extremely pleased with where things stand. 11/04/2020 upon evaluation today patient appears to be doing well with regard to her left leg. In fact this is getting very close to complete granulation and epithelization at this point I am very pleased with where we stand. I do not see any signs of infection currently. 11/18/2020 upon evaluation today patient appears to be doing well in regard to her wound. She is in fact doing extremely well considering the fact we did need to see her last week she was apparently sick and missed her appointment. She is therefore had the wrap  on for 2 weeks. Fortunately there is no signs of active infection at this time. No fevers, chills, nausea, vomiting, or diarrhea. Electronic Signature(s) Signed: 11/18/2020 4:43:05 PM By: Worthy Keeler PA-C Entered By: Worthy Keeler on 11/18/2020 16:43:05 Kitson, [name] ([id number]) -------------------------------------------------------------------------------- Physical Exam Details Patient Name: [name] Date of Service: 11/18/2020 3:00 PM Medical Record Number: [id number] Patient Account Number: [id number] Date of Birth/Sex: 1965-04-13 (55 y.o. F) Treating RN: Carlene Coria Primary Care Provider: Laurance Flatten Other Clinician: Referring Provider: Laurance Flatten Treating Provider/Extender: Jeri Cos Weeks in Treatment: 7 Constitutional Obese and well-hydrated in no acute distress. Respiratory normal breathing without difficulty. Psychiatric this patient is able to make decisions and demonstrates good insight into disease process. Alert and Oriented x 3. pleasant and cooperative. Notes Upon inspection patient's wound bed actually showed signs of good granulation and epithelization at this point. Fortunately there does not appear to be any signs of active infection which is great news she does have again the wound areas which are open the wrap did slide a little bit being on for 2 weeks and that caused a little bit of an indention right over the wound which is also somewhat painful. In general however I think we are headed in the right direction. Electronic Signature(s) Signed: 11/18/2020 4:43:41 PM By: Worthy Keeler PA-C Entered By: Worthy Keeler on 11/18/2020 16:43:41 Fahrner, [name] ([id number]) -------------------------------------------------------------------------------- Physician Orders Details Patient Name: [name] Date of Service: 11/18/2020 3:00 PM Medical Record Number: [id number] Patient Account Number: [id number] Date of Birth/Sex: [birth year] (55 y.o.  F) Treating RN: Carlene Coria Primary Care Provider: Laurance Flatten Other Clinician: Referring Provider: Laurance Flatten Treating Provider/Extender: Skipper Cliche in Treatment: 7 Verbal / Phone Orders: No Diagnosis Coding ICD-10 Coding Code Description I87.2 Venous insufficiency (chronic) (peripheral) I89.0 Lymphedema, not elsewhere classified L97.822 Non-pressure chronic ulcer of other part of left lower leg with fat layer exposed E11.622 Type 2 diabetes mellitus with other skin ulcer N18.30 Chronic kidney disease, stage 3 unspecified I10 Essential (primary) hypertension I50.42 Chronic combined systolic (congestive) and diastolic (congestive) heart failure Follow-up Appointments o Return Appointment in 1 week. Bathing/ Shower/ Hygiene o May shower with wound dressing protected with water repellent cover or cast protector. Edema Control - Lymphedema / Segmental Compressive Device / Other o Optional: One layer of unna paste to top of compression wrap (to act as an anchor). o Patient to wear own Velcro compression garment. Remove compression stockings every night before going to bed and put on every morning when getting up. - right lower leg o Elevate, Exercise Daily and Avoid Standing for Long Periods of Time. o Elevate legs to the level of the heart and pump ankles as often as possible o Elevate leg(s) parallel to the floor when sitting. o Compression Pump: Use compression  pump on left lower extremity for 60 minutes, twice daily. o Compression Pump: Use compression pump on right lower extremity for 60 minutes, twice daily. Wound Treatment Wound #7 - Lower Leg Wound Laterality: Left, Anterior Cleanser: Soap and Water 1 x Per Week/30 Days Discharge Instructions: Gently cleanse wound with antibacterial soap, rinse and pat dry prior to dressing wounds Primary Dressing: Silvercel 4 1/4x 4 1/4 (in/in) 1 x Per Week/30 Days Discharge Instructions: Apply Silvercel 4 1/4x 4 1/4  (in/in) as instructed Secondary Dressing: ABD Pad 5x9 (in/in) 1 x Per Week/30 Days Discharge Instructions: Cover with ABD pad Compression Wrap: Profore Lite LF 3 Multilayer Compression Bandaging System 1 x Per Week/30 Days Discharge Instructions: Apply 3 multi-layer wrap as prescribed. Wound #8 - Lower Leg Wound Laterality: Left, Posterior Cleanser: Soap and Water 1 x Per Week/30 Days Discharge Instructions: Gently cleanse wound with antibacterial soap, rinse and pat dry prior to dressing wounds Primary Dressing: Silvercel 4 1/4x 4 1/4 (in/in) 1 x Per Week/30 Days Discharge Instructions: Apply Silvercel 4 1/4x 4 1/4 (in/in) as instructed Secondary Dressing: ABD Pad 5x9 (in/in) 1 x Per Week/30 Days Discharge Instructions: Cover with ABD pad Compression Wrap: Profore Lite LF 3 Multilayer Compression Bandaging System 1 x Per Week/30 Days Discharge Instructions: Apply 3 multi-layer wrap as prescribed. Segundo, Katianna ([id number]) Compression Stockings: Circaid Juxta Lite Compression Wrap Left Leg Compression Amount: 30-40 mmHG Discharge Instructions: Apply Circaid Juxta Lite Compression Wrap as directed Electronic Signature(s) Signed: 11/18/2020 5:05:16 PM By: Worthy Keeler PA-C Signed: 11/20/2020 7:55:31 AM By: Carlene Coria RN Entered By: Carlene Coria on 11/18/2020 15:28:23 Mcclintock, Noriah ([id number]) -------------------------------------------------------------------------------- Problem List Details Patient Name: [name] Date of Service: 11/18/2020 3:00 PM Medical Record Number: [id number] Patient Account Number: [id number] Date of Birth/Sex: [birth year] (55 y.o. F) Treating RN: Carlene Coria Primary Care Provider: Laurance Flatten Other Clinician: Referring Provider: Laurance Flatten Treating Provider/Extender: Skipper Cliche in Treatment: 7 Active Problems ICD-10 Encounter Code Description Active Date MDM Diagnosis I87.2 Venous insufficiency (chronic) (peripheral) 09/30/2020 No  Yes I89.0 Lymphedema, not elsewhere classified 09/30/2020 No Yes L97.822 Non-pressure chronic ulcer of other part of left lower leg with fat layer 09/30/2020 No Yes exposed E11.622 Type 2 diabetes mellitus with other skin ulcer 09/30/2020 No Yes N18.30 Chronic kidney disease, stage 3 unspecified 09/30/2020 No Yes I10 Essential (primary) hypertension 09/30/2020 No Yes I50.42 Chronic combined systolic (congestive) and diastolic (congestive) heart 09/30/2020 No Yes failure Inactive Problems Resolved Problems Electronic Signature(s) Signed: 11/18/2020 3:08:52 PM By: Worthy Keeler PA-C Entered By: Worthy Keeler on 11/18/2020 15:08:51 Gene, Dezarai ([id number]) -------------------------------------------------------------------------------- Progress Note Details Patient Name: [name] Date of Service: 11/18/2020 3:00 PM Medical Record Number: [id number] Patient Account Number: [id number] Date of Birth/Sex: 1966/03/16 (55 y.o. F) Treating RN: Carlene Coria Primary Care Provider: Laurance Flatten Other Clinician: Referring Provider: Laurance Flatten Treating Provider/Extender: Skipper Cliche in Treatment: 7 Subjective Chief Complaint Information obtained from Patient Left LE Ulcer History of Present Illness (HPI) 10/24/2018 on evaluation today patient presents for initial evaluation or clinic concerning issues that she has been having with lymphedema for quite some time. Unfortunately she has several wound openings at this point that secondary to her lymphedema/venous stasis are giving her trouble and leaking quite severely. She also has diabetes along with hypertension and stage III chronic kidney disease. Fortunately there is no signs of active infection at this time. She is going to likely require debridement of the left leg ulcer upon evaluation today just based on  what I am seeing. Fortunately there is no wound opening on the right. Overall the patient seems to be doing quite well and again  there is no evidence of systemic infection which is good news. No fevers, chills, nausea, vomiting, or diarrhea. 10/31/2018 on evaluation today patient actually appears to be doing excellent in regard to her lower extremity ulcers. She has been tolerating the dressing changes without complication. Fortunately there is no signs of active infection at this time. She has tolerated 3 layer compression wrap without complication. 11/07/2018 upon evaluation today patient actually appears to be doing very well with regard to her left lower extremity ulcers. She has been tolerating the dressing changes without complication. Fortunately there is no signs of active infection. No fevers, chills, nausea, vomiting, or diarrhea. 11/21/2018 upon evaluation today patient appears to be doing quite well with regard to her lower extremity ulcers. In fact both areas seem to be showing signs of good improvement which is excellent. She is not having as much pain as she has in the past and again has a lot of healing compared to previous visits as well. 12/05/2018 on evaluation today patient presents for follow-up concerning her ongoing issues with her bilateral lower extremity ulcers. The good news is her right lower extremity is showing signs of completely healing at this time which is great news. Fortunately there is no evidence of active infection. On the left she has just a very small area still remaining that is open at this time all in all she is very close to complete closure in my opinion. She does have compression stockings to wear at home. 12/12/2018 on evaluation today patient appears to be doing excellent in regard to her left lateral lower extremity ulcer. She has been tolerating the dressing changes without complication. Fortunately there is no signs of active infection at this time. In fact this appears to be pretty much healed at this point although again she is not 100% today. No fevers, chills, nausea,  vomiting, or diarrhea. 12/19/2018 on evaluation today patient actually appears to be doing excellent with regard to her lower extremity ulcer in fact this appears to be completely healed today which is all some. She has done extremely well with wound care measures. No fevers, chills, nausea, vomiting, or diarrhea. ------------------------------------ 11/01/19-Readmission to the clinic Patient presents with left leg pain, wounds posterior calf, onset about 4 weeks, denies any fevers chills or shakes, has not been using anything to these wounds, was given compression stockings at last discharge from the clinic but has not been able to use them as they rolled down and cause creases Patient's history significant for type 2 diabetes insulin requiring, A1c of 6.9 lately, hypertension, chronic pain 8/18; patient readmitted that the clinic last week. She has wounds on her left lateral posterior lower leg all of this in close juxtaposition i.e. a localized site. We've been using silver alginate under 3 layer compression apparently the wound surface area is better. She was wearing compression stockings from elastic therapy but says they were falling down. As she progresses more towards healing will need to address what we use in terms of compression stockings perhaps external compression garments 11/17/2019 on evaluation today patient appears to be doing well with regard to her legs currently. She does have 2 wounds which are actually measuring much better the more posterior is actually doing very well which I am pleased with the other though smaller is not quite a small but nonetheless on the lateral  aspect does seem to be improving. 11/23/2019 on evaluation today patient appears to be doing well in regard to her wounds. In fact the wound on the posterior aspect of her leg appears healed the lateral aspect is still open but extremely small. 9/15; this is a patient with chronic venous insufficiency and secondary  lymphedema. She did not keep her clinic appointment last week and hence the left leg is a lot more swollen since she had to take the wrap off at some point. She has a small weeping area on the left lateral lower leg. She has probably a juxta lite stocking for the left leg I have asked her to bring that in when she comes to her clinic appointment next week at which time she should be healed 10/6; this is a patient with chronic venous insufficiency and secondary lymphedema left greater than right. Skin changes of chronic lymphedema especially in the left leg. She has not been here in 3 weeks. She took the wrap off 2 weeks ago. She has some very old 20/30 stockings from elastic therapy in [place] she has been using. Fortunately her leg is closed. She also has a single Farrow wrap for the left leg Readmission: 06/18/2020 upon evaluation today patient presents for reevaluation here in the clinic. She is a long-term patient that we have seen intermittently Denis, Leslyn ([id number]) when she has had flareups of issues with her lower extremity secondary to chronic venous stasis. With that being said unfortunately today she is having a significant issue with a wound which is actually quite significant over the right lower extremity. She did see dermatology they put Vaseline followed by [place] dressings on her unfortunately she is continuing to have significant issues however here with pain and in fact this wrap was extremely stuck to her upon evaluation today. Nonetheless I believe that she is going to require a little bit different approach to try to keep things from sticking and hopefully aid in getting this to heal more effectively and quickly. With that being said she does have a history of chronic venous insufficiency, lymphedema, diabetes mellitus type 2, chronic kidney disease stage III, hypertension, and congestive heart failure. This started as an injury in mid February and has worsened over  the past 2 weeks. 07/04/2020 upon evaluation today patient appears to be doing well with regard to her wound on the leg. Overall I am extremely pleased with where things stand and I do think that she is making excellent progress. There is no sign of infection right now which is also great news. I think that she is close to complete resolution. 07/09/20 upon evaluation today patient appears to be doing excellent in regard to her leg ulcer. She has been tolerating the dressing changes without complication and in general I am extremely pleased with where things stand today. There does not appear to be any signs of active infection at this time which is great news. 07/25/2020 upon evaluation today patient appears to be doing well with regard to her wound. She has been tolerating the dressing changes without complication. Fortunately there is no signs of active infection at this time and her wound appears to be completely healed. Readmission: 09/30/2020 upon evaluation today patient appears for reevaluation here in our clinic. She was discharged May 5 that unfortunately has started to have weeping and wounds on the left leg at this point. She tells me the right leg did weep some but nothing as significant as the left. With all  that being said at this point the patient states that she figured it was best to come in here and she was having a lot of drainage and it was get all of her sheets and everything. She has been wearing her Velcro wraps sometimes but it does not sound like she is been wearing them all the time which I think is part of the reason why this reopened. She also unfortunately had a fall which was quite significant. This occurred I believe about a week or 2 ago. Otherwise patient's past medical history really has not changed significantly. 10/07/2020 upon evaluation today patient appears to be doing better in regard to her wounds of the lower extremities. Fortunately there does not appear to be  any signs of active infection at this time which is great news. I do not see any evidence of anything worsening but again she continues to have significant bilateral stage III lymphedema. This has been an ongoing issue. We actually have seen her this year pretty much from March through May of 2022. Following this she continued with compression at home following but then unfortunately reopened again. That was on July 11. With that being said this is an ongoing and recurrent issue for which honestly have seen her over the past 2 years pretty much consistently. Nonetheless we have had Velcro compression wraps that she uses at home and despite this she continues to have frequent and recurrent ulcerations. 7/27; patient presents for 1 week follow-up. She has no issues or complaints today. She denies signs of infection. 10/21/2020 upon evaluation today patient's leg actually appears to be doing decently well on the left there is nothing open on the right at all and I am pleased in that regard. On the left side I do think that she still has some areas of weeping I believe the compression wrap is still the best way to go although I am hopeful that we are headed in the right direction as far as getting this area to heal and silver. Upon inspection patient's wound bed actually showed signs of good granulation epithelization at this point. There does not appear to be any evidence of active infection which is great news and overall I am extremely pleased with where things stand. 11/04/2020 upon evaluation today patient appears to be doing well with regard to her left leg. In fact this is getting very close to complete granulation and epithelization at this point I am very pleased with where we stand. I do not see any signs of infection currently. 11/18/2020 upon evaluation today patient appears to be doing well in regard to her wound. She is in fact doing extremely well considering the fact we did need to see her  last week she was apparently sick and missed her appointment. She is therefore had the wrap on for 2 weeks. Fortunately there is no signs of active infection at this time. No fevers, chills, nausea, vomiting, or diarrhea. Objective Constitutional Obese and well-hydrated in no acute distress. Vitals Time Taken: 3:11 PM, Temperature: 100.7 F, Pulse: 68 bpm, Respiratory Rate: 18 breaths/min, Blood Pressure: 146/88 mmHg. Respiratory normal breathing without difficulty. Psychiatric this patient is able to make decisions and demonstrates good insight into disease process. Alert and Oriented x 3. pleasant and cooperative. General Notes: Upon inspection patient's wound bed actually showed signs of good granulation and epithelization at this point. Fortunately there does not appear to be any signs of active infection which is great news she does have again the  wound areas which are open the wrap did slide a little bit being on for 2 weeks and that caused a little bit of an indention right over the wound which is also somewhat painful. In general however I think we are headed in the right direction. Hopwood, Fable ([id number]) Integumentary (Hair, Skin) Wound #7 status is Open. Original cause of wound was Gradually Appeared. The date acquired was: 09/20/2020. The wound has been in treatment 7 weeks. The wound is located on the Left,Anterior Lower Leg. The wound measures 5cm length x 2.5cm width x 0.1cm depth; 9.817cm^2 area and 0.982cm^3 volume. There is Fat Layer (Subcutaneous Tissue) exposed. There is no tunneling or undermining noted. There is a medium amount of serosanguineous drainage noted. There is medium (34-66%) granulation within the wound bed. There is a medium (34-66%) amount of necrotic tissue within the wound bed including Adherent Slough. Wound #8 status is Open. Original cause of wound was Gradually Appeared. The date acquired was: 09/20/2020. The wound has been in treatment 7 weeks. The  wound is located on the Left,Posterior Lower Leg. The wound measures 2.2cm length x 1cm width x 0.1cm depth; 1.728cm^2 area and 0.173cm^3 volume. There is Fat Layer (Subcutaneous Tissue) exposed. There is no tunneling or undermining noted. There is a medium amount of serosanguineous drainage noted. There is small (1-33%) pink granulation within the wound bed. There is a large (67-100%) amount of necrotic tissue within the wound bed including Adherent Slough. 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 There was a Three Layer Compression Therapy Procedure by Carlene Coria, RN. Post procedure Diagnosis Wound #7: Same as Pre-Procedure Wound #8 Pre-procedure diagnosis of Wound #8 is a Diabetic Wound/Ulcer of the Lower Extremity located on the Left,Posterior Lower Leg . There was a Three Layer Compression Therapy Procedure by Carlene Coria, RN. Post procedure Diagnosis Wound #8: Same as Pre-Procedure Plan Follow-up Appointments: Return Appointment in 1 week. Bathing/ Shower/ Hygiene: May shower with wound dressing protected with water repellent cover or cast protector. Edema Control - Lymphedema / Segmental Compressive Device / Other: Optional: One layer of unna paste to top of compression wrap (to act as an anchor). Patient to wear own Velcro compression garment. Remove compression stockings every night before going to bed and put on every morning when getting up. - right lower leg Elevate,  Exercise Daily and Avoid Standing for Long Periods of Time. Elevate legs to the level of the heart and pump ankles as often as possible Elevate leg(s) parallel to the floor when sitting. Compression Pump: Use compression pump on left lower extremity for 60 minutes, twice daily. Compression Pump: Use compression pump on right lower extremity for 60 minutes, twice daily. WOUND #7: - Lower Leg Wound Laterality: Left, Anterior Cleanser: Soap and Water 1 x Per Week/30 Days Discharge Instructions: Gently cleanse wound with antibacterial soap, rinse and pat dry prior to dressing wounds Primary Dressing: Silvercel 4 1/4x 4 1/4 (in/in) 1 x Per Week/30 Days Discharge Instructions: Apply Silvercel 4 1/4x 4 1/4 (in/in) as instructed Secondary Dressing: ABD Pad 5x9 (in/in) 1 x Per Week/30 Days Discharge Instructions:  Cover with ABD pad Vidas, Adelheid ([id number]) Compression Wrap: Profore Lite LF 3 Multilayer Compression Bandaging System 1 x Per Week/30 Days Discharge Instructions: Apply 3 multi-layer wrap as prescribed. WOUND #8: - Lower Leg Wound Laterality: Left, Posterior Cleanser: Soap and Water 1 x Per Week/30 Days Discharge Instructions: Gently cleanse wound with antibacterial soap, rinse and pat dry prior to dressing wounds Primary Dressing: Silvercel 4 1/4x 4 1/4 (in/in) 1 x Per Week/30 Days Discharge Instructions: Apply Silvercel 4 1/4x 4 1/4 (in/in) as instructed Secondary Dressing: ABD Pad 5x9 (in/in) 1 x Per Week/30 Days Discharge Instructions: Cover with ABD pad Compression Wrap: Profore Lite LF 3 Multilayer Compression Bandaging System 1 x Per Week/30 Days Discharge Instructions: Apply 3 multi-layer wrap as prescribed. Compression Stockings: Circaid Juxta Lite Compression Wrap Compression Amount: 30-40 mmHg (left) Discharge Instructions: Apply Circaid Juxta Lite Compression Wrap as directed 1. Would recommend currently that we going continue with the wound care measures as before and  the patient is in agreement with the plan. This includes the use of the 3 layer compression wrap which I think is doing a great job. 2. I am also can recommend that we continue with the silver alginate dressing as well as the ABD pad. We will see patient back for reevaluation in 1 week here in the clinic. If anything worsens or changes patient will contact our office for additional recommendations. Electronic Signature(s) Signed: 11/18/2020 4:44:00 PM By: Worthy Keeler PA-C Entered By: Worthy Keeler on 11/18/2020 16:43:59 Jurney, [name] ([id number]) -------------------------------------------------------------------------------- SuperBill Details Patient Name: [name] Date of Service: 11/18/2020 Medical Record Number: [id number] Patient Account Number: [id number] Date of Birth/Sex: 02/27/1966 (55 y.o. F) Treating RN: Carlene Coria Primary Care Provider: Laurance Flatten Other Clinician: Referring Provider: Laurance Flatten Treating Provider/Extender: Jeri Cos Weeks in Treatment: 7 Diagnosis Coding ICD-10 Codes Code Description I87.2 Venous insufficiency (chronic) (peripheral) I89.0 Lymphedema, not elsewhere classified L97.822 Non-pressure chronic ulcer of other part of left lower leg with fat layer exposed E11.622 Type 2 diabetes mellitus with other skin ulcer N18.30 Chronic kidney disease, stage 3 unspecified I10 Essential (primary) hypertension I50.42 Chronic combined systolic (congestive) and diastolic (congestive) heart failure Facility Procedures CPT4 Code: [id number] Description: (Facility Use Only) [phone] - [facility] LT LEG Modifier: Quantity: 1 Physician Procedures CPT4 Code: [id number] Description: 99214 - WC PHYS LEVEL 4 - EST PT Modifier: Quantity: 1 CPT4 Code: Description: ICD-10 Diagnosis Description I87.2 Venous insufficiency (chronic) (peripheral) I89.0 Lymphedema, not elsewhere classified L97.822 Non-pressure chronic ulcer of other part of left lower  leg with fat lay E11.622 Type 2 diabetes mellitus with  other skin ulcer Modifier: er exposed Quantity: Electronic Signature(s) Signed: 11/18/2020 4:44:35 PM By: Worthy Keeler PA-C Entered By: Worthy Keeler on 11/18/2020 16:44:34

## 2020-11-20 ENCOUNTER — Telehealth (HOSPITAL_COMMUNITY): Payer: Self-pay | Admitting: Cardiology

## 2020-11-20 ENCOUNTER — Encounter (HOSPITAL_COMMUNITY): Payer: Medicare Other | Admitting: Cardiology

## 2020-11-21 ENCOUNTER — Encounter (HOSPITAL_COMMUNITY): Payer: Medicare Other | Admitting: Cardiology

## 2020-11-21 ENCOUNTER — Telehealth (HOSPITAL_COMMUNITY): Payer: Self-pay | Admitting: Cardiology

## 2020-11-21 NOTE — Telephone Encounter (Signed)
She cannot go without the potassium.  Her potassium level will get too low and she could have a cardiac arrest.  Could see if she can get potassium powder rather than the tablets.

## 2020-11-21 NOTE — Telephone Encounter (Signed)
Pt stated she stop taking the potassium, it was making the top of mouth raw, and causing diarrhea, please advise

## 2020-11-21 NOTE — Telephone Encounter (Signed)
Left vm for pt to return my call. Left detailed vm.

## 2020-11-22 ENCOUNTER — Ambulatory Visit: Payer: Medicare Other | Admitting: Podiatry

## 2020-11-22 MED ORDER — POTASSIUM CHLORIDE 20 MEQ/15ML (10%) PO SOLN: 20.0000 meq | Freq: Every day | ORAL | 11 refills | Status: DC

## 2020-11-22 NOTE — Telephone Encounter (Signed)
Patient left detailed message that she is agreeable to powder however pharmacy has solution on hand.  Tablets changed to solution  Detailed message left for patient with changes

## 2020-11-26 ENCOUNTER — Ambulatory Visit: Payer: Medicare Other | Admitting: Physician Assistant

## 2020-11-28 ENCOUNTER — Encounter: Payer: Medicare Other | Attending: Physician Assistant | Admitting: Physician Assistant

## 2020-11-28 ENCOUNTER — Other Ambulatory Visit: Payer: Self-pay

## 2020-11-28 DIAGNOSIS — E1122 Type 2 diabetes mellitus with diabetic chronic kidney disease: Secondary | ICD-10-CM | POA: Diagnosis not present

## 2020-11-28 DIAGNOSIS — N183 Chronic kidney disease, stage 3 unspecified: Secondary | ICD-10-CM | POA: Insufficient documentation

## 2020-11-28 DIAGNOSIS — I890 Lymphedema, not elsewhere classified: Secondary | ICD-10-CM | POA: Insufficient documentation

## 2020-11-28 DIAGNOSIS — I5042 Chronic combined systolic (congestive) and diastolic (congestive) heart failure: Secondary | ICD-10-CM | POA: Diagnosis not present

## 2020-11-28 DIAGNOSIS — I130 Hypertensive heart and chronic kidney disease with heart failure and stage 1 through stage 4 chronic kidney disease, or unspecified chronic kidney disease: Secondary | ICD-10-CM | POA: Diagnosis not present

## 2020-11-28 DIAGNOSIS — L97822 Non-pressure chronic ulcer of other part of left lower leg with fat layer exposed: Secondary | ICD-10-CM | POA: Insufficient documentation

## 2020-11-28 DIAGNOSIS — E11622 Type 2 diabetes mellitus with other skin ulcer: Secondary | ICD-10-CM | POA: Diagnosis not present

## 2020-11-28 DIAGNOSIS — I872 Venous insufficiency (chronic) (peripheral): Secondary | ICD-10-CM | POA: Insufficient documentation

## 2020-11-28 NOTE — Progress Notes (Addendum)
[name] ([id number]) Visit Report for 11/28/2020 Chief Complaint Document Details Patient Name: [name] Date of Service: 11/28/2020 12:30 PM Medical Record Number: [id number] Patient Account Number: [id number] Date of Birth/Sex: 1965-10-08 (55 y.o. F) Treating RN: Carlene Coria Primary Care Provider: Laurance Flatten Other Clinician: Referring Provider: Laurance Flatten Treating Provider/Extender: Skipper Cliche in Treatment: 8 Information Obtained from: Patient Chief Complaint Left LE Ulcer Electronic Signature(s) Signed: 11/28/2020 1:17:57 PM By: Worthy Keeler PA-C Entered By: Worthy Keeler on 11/28/2020 13:17:56 Leavey, [name] ([id number]) -------------------------------------------------------------------------------- HPI Details Patient Name: [name] Date of Service: 11/28/2020 12:30 PM Medical Record Number: [id number] Patient Account Number: [id number] Date of Birth/Sex: 06/30/1965 (55 y.o. F) Treating RN: Carlene Coria Primary Care Provider: Laurance Flatten Other Clinician: Referring Provider: Laurance Flatten Treating Provider/Extender: Skipper Cliche in Treatment: 8 History of Present Illness HPI Description: 10/24/2018 on evaluation today patient presents for initial evaluation or clinic concerning issues that she has been having with lymphedema for quite some time. Unfortunately she has several wound openings at this point that secondary to her lymphedema/venous stasis are giving her trouble and leaking quite severely. She also has diabetes along with hypertension and stage III chronic kidney disease. Fortunately there is no signs of active infection at this time. She is going to likely require debridement of the left leg ulcer upon evaluation today just based on what I am seeing. Fortunately there is no wound opening on the right. Overall the patient seems to be doing quite well and again there is no evidence of systemic infection which is good news. No fevers, chills,  nausea, vomiting, or diarrhea. 10/31/2018 on evaluation today patient actually appears to be doing excellent in regard to her lower extremity ulcers. She has been tolerating the dressing changes without complication. Fortunately there is no signs of active infection at this time. She has tolerated 3 layer compression wrap without complication. 11/07/2018 upon evaluation today patient actually appears to be doing very well with regard to her left lower extremity ulcers. She has been tolerating the dressing changes without complication. Fortunately there is no signs of active infection. No fevers, chills, nausea, vomiting, or diarrhea. 11/21/2018 upon evaluation today patient appears to be doing quite well with regard to her lower extremity ulcers. In fact both areas seem to be showing signs of good improvement which is excellent. She is not having as much pain as she has in the past and again has a lot of healing compared to previous visits as well. 12/05/2018 on evaluation today patient presents for follow-up concerning her ongoing issues with her bilateral lower extremity ulcers. The good news is her right lower extremity is showing signs of completely healing at this time which is great news. Fortunately there is no evidence of active infection. On the left she has just a very small area still remaining that is open at this time all in all she is very close to complete closure in my opinion. She does have compression stockings to wear at home. 12/12/2018 on evaluation today patient appears to be doing excellent in regard to her left lateral lower extremity ulcer. She has been tolerating the dressing changes without complication. Fortunately there is no signs of active infection at this time. In fact this appears to be pretty much healed at this point although again she is not 100% today. No fevers, chills, nausea, vomiting, or diarrhea. 12/19/2018 on evaluation today patient actually appears to be doing  excellent with regard to her lower extremity ulcer  in fact this appears to be completely healed today which is all some. She has done extremely well with wound care measures. No fevers, chills, nausea, vomiting, or diarrhea. ------------------------------------ 11/01/19-Readmission to the clinic Patient presents with left leg pain, wounds posterior calf, onset about 4 weeks, denies any fevers chills or shakes, has not been using anything to these wounds, was given compression stockings at last discharge from the clinic but has not been able to use them as they rolled down and cause creases Patient's history significant for type 2 diabetes insulin requiring, A1c of 6.9 lately, hypertension, chronic pain 8/18; patient readmitted that the clinic last week. She has wounds on her left lateral posterior lower leg all of this in close juxtaposition i.e. a localized site. We've been using silver alginate under 3 layer compression apparently the wound surface area is better. She was wearing compression stockings from elastic therapy but says they were falling down. As she progresses more towards healing will need to address what we use in terms of compression stockings perhaps external compression garments 11/17/2019 on evaluation today patient appears to be doing well with regard to her legs currently. She does have 2 wounds which are actually measuring much better the more posterior is actually doing very well which I am pleased with the other though smaller is not quite a small but nonetheless on the lateral aspect does seem to be improving. 11/23/2019 on evaluation today patient appears to be doing well in regard to her wounds. In fact the wound on the posterior aspect of her leg appears healed the lateral aspect is still open but extremely small. 9/15; this is a patient with chronic venous insufficiency and secondary lymphedema. She did not keep her clinic appointment last week and hence the left leg is a  lot more swollen since she had to take the wrap off at some point. She has a small weeping area on the left lateral lower leg. She has probably a juxta lite stocking for the left leg I have asked her to bring that in when she comes to her clinic appointment next week at which time she should be healed 10/6; this is a patient with chronic venous insufficiency and secondary lymphedema left greater than right. Skin changes of chronic lymphedema especially in the left leg. She has not been here in 3 weeks. She took the wrap off 2 weeks ago. She has some very old 20/30 stockings from elastic therapy in [place] she has been using. Fortunately her leg is closed. She also has a single Farrow wrap for the left leg Readmission: 06/18/2020 upon evaluation today patient presents for reevaluation here in the clinic. She is a long-term patient that we have seen intermittently when she has had flareups of issues with her lower extremity secondary to chronic venous stasis. With that being said unfortunately today she is having a significant issue with a wound which is actually quite significant over the right lower extremity. She did see dermatology they put Vaseline followed by [place] dressings on her unfortunately she is continuing to have significant issues however here with pain and in fact this wrap was extremely stuck to her upon evaluation today. Nonetheless I believe that she is going to require a little bit different approach to try to keep things from sticking and hopefully aid in getting this to heal more effectively and quickly. With that being said she does have a history of Zender, Tru ([id number]) chronic venous insufficiency, lymphedema, diabetes mellitus type 2,  chronic kidney disease stage III, hypertension, and congestive heart failure. This started as an injury in mid February and has worsened over the past 2 weeks. 07/04/2020 upon evaluation today patient appears to be doing well with  regard to her wound on the leg. Overall I am extremely pleased with where things stand and I do think that she is making excellent progress. There is no sign of infection right now which is also great news. I think that she is close to complete resolution. 07/09/20 upon evaluation today patient appears to be doing excellent in regard to her leg ulcer. She has been tolerating the dressing changes without complication and in general I am extremely pleased with where things stand today. There does not appear to be any signs of active infection at this time which is great news. 07/25/2020 upon evaluation today patient appears to be doing well with regard to her wound. She has been tolerating the dressing changes without complication. Fortunately there is no signs of active infection at this time and her wound appears to be completely healed. Readmission: 09/30/2020 upon evaluation today patient appears for reevaluation here in our clinic. She was discharged May 5 that unfortunately has started to have weeping and wounds on the left leg at this point. She tells me the right leg did weep some but nothing as significant as the left. With all that being said at this point the patient states that she figured it was best to come in here and she was having a lot of drainage and it was get all of her sheets and everything. She has been wearing her Velcro wraps sometimes but it does not sound like she is been wearing them all the time which I think is part of the reason why this reopened. She also unfortunately had a fall which was quite significant. This occurred I believe about a week or 2 ago. Otherwise patient's past medical history really has not changed significantly. 10/07/2020 upon evaluation today patient appears to be doing better in regard to her wounds of the lower extremities. Fortunately there does not appear to be any signs of active infection at this time which is great news. I do not see any evidence  of anything worsening but again she continues to have significant bilateral stage III lymphedema. This has been an ongoing issue. We actually have seen her this year pretty much from March through May of 2022. Following this she continued with compression at home following but then unfortunately reopened again. That was on July 11. With that being said this is an ongoing and recurrent issue for which honestly have seen her over the past 2 years pretty much consistently. Nonetheless we have had Velcro compression wraps that she uses at home and despite this she continues to have frequent and recurrent ulcerations. 7/27; patient presents for 1 week follow-up. She has no issues or complaints today. She denies signs of infection. 10/21/2020 upon evaluation today patient's leg actually appears to be doing decently well on the left there is nothing open on the right at all and I am pleased in that regard. On the left side I do think that she still has some areas of weeping I believe the compression wrap is still the best way to go although I am hopeful that we are headed in the right direction as far as getting this area to heal and silver. Upon inspection patient's wound bed actually showed signs of good granulation epithelization at this point. There does  not appear to be any evidence of active infection which is great news and overall I am extremely pleased with where things stand. 11/04/2020 upon evaluation today patient appears to be doing well with regard to her left leg. In fact this is getting very close to complete granulation and epithelization at this point I am very pleased with where we stand. I do not see any signs of infection currently. 11/18/2020 upon evaluation today patient appears to be doing well in regard to her wound. She is in fact doing extremely well considering the fact we did need to see her last week she was apparently sick and missed her appointment. She is therefore had the wrap  on for 2 weeks. Fortunately there is no signs of active infection at this time. No fevers, chills, nausea, vomiting, or diarrhea. 11/28/2020 upon evaluation today patient appears to be doing well with regard to her legs. Things are healing nicely and overall I do not see any signs of infection at this time which is great news. No fevers, chills, nausea, vomiting, or diarrhea. With that being said she still has a couple areas that are open and weeping currently. Electronic Signature(s) Signed: 11/29/2020 9:54:10 AM By: Worthy Keeler PA-C Previous Signature: 11/29/2020 9:53:47 AM Version By: Worthy Keeler PA-C Entered By: Worthy Keeler on 11/29/2020 09:54:10 Nault, [name] ([id number]) -------------------------------------------------------------------------------- Physical Exam Details Patient Name: [name] Date of Service: 11/28/2020 12:30 PM Medical Record Number: [id number] Patient Account Number: [id number] Date of Birth/Sex: [birth year] (55 y.o. F) Treating RN: Carlene Coria Primary Care Provider: Laurance Flatten Other Clinician: Referring Provider: Laurance Flatten Treating Provider/Extender: Jeri Cos Weeks in Treatment: 8 Constitutional Well-nourished and well-hydrated in no acute distress. Respiratory normal breathing without difficulty. Psychiatric this patient is able to make decisions and demonstrates good insight into disease process. Alert and Oriented x 3. pleasant and cooperative. Notes Upon inspection patient's wound bed showed signs of good granulation epithelization at this point. Fortunately there does not appear to be any evidence of infection she has just areas of weeping and in fact I think a lot of the wounds have pretty much sealed up and again what is remaining there is not really doing too poorly overall. Electronic Signature(s) Signed: 11/29/2020 9:54:30 AM By: Worthy Keeler PA-C Entered By: Worthy Keeler on 11/29/2020 09:54:30 Espiritu, [name]  ([id number]) -------------------------------------------------------------------------------- Physician Orders Details Patient Name: [name] Date of Service: 11/28/2020 12:30 PM Medical Record Number: [id number] Patient Account Number: [id number] Date of Birth/Sex: [birth year] (55 y.o. F) Treating RN: Carlene Coria Primary Care Provider: Laurance Flatten Other Clinician: Referring Provider: Laurance Flatten Treating Provider/Extender: Skipper Cliche in Treatment: 8 Verbal / Phone Orders: No Diagnosis Coding ICD-10 Coding Code Description I87.2 Venous insufficiency (chronic) (peripheral) I89.0 Lymphedema, not elsewhere classified L97.822 Non-pressure chronic ulcer of other part of left lower leg with fat layer exposed E11.622 Type 2 diabetes mellitus with other skin ulcer N18.30 Chronic kidney disease, stage 3 unspecified I10 Essential (primary) hypertension I50.42 Chronic combined systolic (congestive) and diastolic (congestive) heart failure Follow-up Appointments o Return Appointment in 1 week. Bathing/ Shower/ Hygiene o May shower with wound dressing protected with water repellent cover or cast protector. Edema Control - Lymphedema / Segmental Compressive Device / Other o Optional: One layer of unna paste to top of compression wrap (to act as an anchor). o Patient to wear own Velcro compression garment. Remove compression stockings every night before going to bed and put on every morning when getting up. -  right lower leg o Elevate, Exercise Daily and Avoid Standing for Long Periods of Time. o Elevate legs to the level of the heart and pump ankles as often as possible o Elevate leg(s) parallel to the floor when sitting. o Compression Pump: Use compression pump on left lower extremity for 60 minutes, twice daily. o Compression Pump: Use compression pump on right lower extremity for 60 minutes, twice daily. Wound Treatment Wound #7 - Lower Leg Wound Laterality: Left,  Anterior Cleanser: Soap and Water 1 x Per Week/30 Days Discharge Instructions: Gently cleanse wound with antibacterial soap, rinse and pat dry prior to dressing wounds Primary Dressing: Silvercel 4 1/4x 4 1/4 (in/in) 1 x Per Week/30 Days Discharge Instructions: Apply Silvercel 4 1/4x 4 1/4 (in/in) as instructed Secondary Dressing: ABD Pad 5x9 (in/in) 1 x Per Week/30 Days Discharge Instructions: Cover with ABD pad Compression Wrap: Profore Lite LF 3 Multilayer Compression Bandaging System 1 x Per Week/30 Days Discharge Instructions: Apply 3 multi-layer wrap as prescribed. Wound #8 - Lower Leg Wound Laterality: Left, Posterior Cleanser: Soap and Water 1 x Per Week/30 Days Discharge Instructions: Gently cleanse wound with antibacterial soap, rinse and pat dry prior to dressing wounds Primary Dressing: Silvercel 4 1/4x 4 1/4 (in/in) 1 x Per Week/30 Days Discharge Instructions: Apply Silvercel 4 1/4x 4 1/4 (in/in) as instructed Secondary Dressing: ABD Pad 5x9 (in/in) 1 x Per Week/30 Days Discharge Instructions: Cover with ABD pad Compression Wrap: Profore Lite LF 3 Multilayer Compression Bandaging System 1 x Per Week/30 Days Discharge Instructions: Apply 3 multi-layer wrap as prescribed. Mahr, Gustavia ([id number]) Compression Stockings: Circaid Juxta Lite Compression Wrap Left Leg Compression Amount: 30-40 mmHG Discharge Instructions: Apply Circaid Juxta Lite Compression Wrap as directed Electronic Signature(s) Signed: 11/28/2020 1:23:06 PM By: Carlene Coria RN Signed: 11/29/2020 11:43:44 AM By: Worthy Keeler PA-C Entered By: Carlene Coria on 11/28/2020 13:23:06 Gelpi, Damien ([id number]) -------------------------------------------------------------------------------- Problem List Details Patient Name: [name] Date of Service: 11/28/2020 12:30 PM Medical Record Number: [id number] Patient Account Number: [id number] Date of Birth/Sex: 1965/06/28 (55 y.o. F) Treating RN: Carlene Coria Primary Care Provider: Laurance Flatten Other Clinician: Referring Provider: Laurance Flatten Treating Provider/Extender: Skipper Cliche in Treatment: 8 Active Problems ICD-10 Encounter Code Description Active Date MDM Diagnosis I87.2 Venous insufficiency (chronic) (peripheral) 09/30/2020 No Yes I89.0 Lymphedema, not elsewhere classified 09/30/2020 No Yes L97.822 Non-pressure chronic ulcer of other part of left lower leg with fat layer 09/30/2020 No Yes exposed E11.622 Type 2 diabetes mellitus with other skin ulcer 09/30/2020 No Yes N18.30 Chronic kidney disease, stage 3 unspecified 09/30/2020 No Yes I10 Essential (primary) hypertension 09/30/2020 No Yes I50.42 Chronic combined systolic (congestive) and diastolic (congestive) heart 09/30/2020 No Yes failure Inactive Problems Resolved Problems Electronic Signature(s) Signed: 11/28/2020 1:17:48 PM By: Worthy Keeler PA-C Entered By: Worthy Keeler on 11/28/2020 13:17:48 Caplin, Adely ([id number]) -------------------------------------------------------------------------------- Progress Note Details Patient Name: [name] Date of Service: 11/28/2020 12:30 PM Medical Record Number: [id number] Patient Account Number: [id number] Date of Birth/Sex: Jun 20, 1965 (55 y.o. F) Treating RN: Carlene Coria Primary Care Provider: Laurance Flatten Other Clinician: Referring Provider: Laurance Flatten Treating Provider/Extender: Skipper Cliche in Treatment: 8 Subjective Chief Complaint Information obtained from Patient Left LE Ulcer History of Present Illness (HPI) 10/24/2018 on evaluation today patient presents for initial evaluation or clinic concerning issues that she has been having with lymphedema for quite some time. Unfortunately she has several wound openings at this point that secondary to her lymphedema/venous stasis are giving her trouble and  leaking quite severely. She also has diabetes along with hypertension and stage III chronic kidney  disease. Fortunately there is no signs of active infection at this time. She is going to likely require debridement of the left leg ulcer upon evaluation today just based on what I am seeing. Fortunately there is no wound opening on the right. Overall the patient seems to be doing quite well and again there is no evidence of systemic infection which is good news. No fevers, chills, nausea, vomiting, or diarrhea. 10/31/2018 on evaluation today patient actually appears to be doing excellent in regard to her lower extremity ulcers. She has been tolerating the dressing changes without complication. Fortunately there is no signs of active infection at this time. She has tolerated 3 layer compression wrap without complication. 11/07/2018 upon evaluation today patient actually appears to be doing very well with regard to her left lower extremity ulcers. She has been tolerating the dressing changes without complication. Fortunately there is no signs of active infection. No fevers, chills, nausea, vomiting, or diarrhea. 11/21/2018 upon evaluation today patient appears to be doing quite well with regard to her lower extremity ulcers. In fact both areas seem to be showing signs of good improvement which is excellent. She is not having as much pain as she has in the past and again has a lot of healing compared to previous visits as well. 12/05/2018 on evaluation today patient presents for follow-up concerning her ongoing issues with her bilateral lower extremity ulcers. The good news is her right lower extremity is showing signs of completely healing at this time which is great news. Fortunately there is no evidence of active infection. On the left she has just a very small area still remaining that is open at this time all in all she is very close to complete closure in my opinion. She does have compression stockings to wear at home. 12/12/2018 on evaluation today patient appears to be doing excellent in regard to  her left lateral lower extremity ulcer. She has been tolerating the dressing changes without complication. Fortunately there is no signs of active infection at this time. In fact this appears to be pretty much healed at this point although again she is not 100% today. No fevers, chills, nausea, vomiting, or diarrhea. 12/19/2018 on evaluation today patient actually appears to be doing excellent with regard to her lower extremity ulcer in fact this appears to be completely healed today which is all some. She has done extremely well with wound care measures. No fevers, chills, nausea, vomiting, or diarrhea. ------------------------------------ 11/01/19-Readmission to the clinic Patient presents with left leg pain, wounds posterior calf, onset about 4 weeks, denies any fevers chills or shakes, has not been using anything to these wounds, was given compression stockings at last discharge from the clinic but has not been able to use them as they rolled down and cause creases Patient's history significant for type 2 diabetes insulin requiring, A1c of 6.9 lately, hypertension, chronic pain 8/18; patient readmitted that the clinic last week. She has wounds on her left lateral posterior lower leg all of this in close juxtaposition i.e. a localized site. We've been using silver alginate under 3 layer compression apparently the wound surface area is better. She was wearing compression stockings from elastic therapy but says they were falling down. As she progresses more towards healing will need to address what we use in terms of compression stockings perhaps external compression garments 11/17/2019 on evaluation today patient appears to be doing  well with regard to her legs currently. She does have 2 wounds which are actually measuring much better the more posterior is actually doing very well which I am pleased with the other though smaller is not quite a small but nonetheless on the lateral aspect does seem to  be improving. 11/23/2019 on evaluation today patient appears to be doing well in regard to her wounds. In fact the wound on the posterior aspect of her leg appears healed the lateral aspect is still open but extremely small. 9/15; this is a patient with chronic venous insufficiency and secondary lymphedema. She did not keep her clinic appointment last week and hence the left leg is a lot more swollen since she had to take the wrap off at some point. She has a small weeping area on the left lateral lower leg. She has probably a juxta lite stocking for the left leg I have asked her to bring that in when she comes to her clinic appointment next week at which time she should be healed 10/6; this is a patient with chronic venous insufficiency and secondary lymphedema left greater than right. Skin changes of chronic lymphedema especially in the left leg. She has not been here in 3 weeks. She took the wrap off 2 weeks ago. She has some very old 20/30 stockings from elastic therapy in [place] she has been using. Fortunately her leg is closed. She also has a single Farrow wrap for the left leg Readmission: 06/18/2020 upon evaluation today patient presents for reevaluation here in the clinic. She is a long-term patient that we have seen intermittently Freer, Treniya ([id number]) when she has had flareups of issues with her lower extremity secondary to chronic venous stasis. With that being said unfortunately today she is having a significant issue with a wound which is actually quite significant over the right lower extremity. She did see dermatology they put Vaseline followed by [place] dressings on her unfortunately she is continuing to have significant issues however here with pain and in fact this wrap was extremely stuck to her upon evaluation today. Nonetheless I believe that she is going to require a little bit different approach to try to keep things from sticking and hopefully aid in getting this  to heal more effectively and quickly. With that being said she does have a history of chronic venous insufficiency, lymphedema, diabetes mellitus type 2, chronic kidney disease stage III, hypertension, and congestive heart failure. This started as an injury in mid February and has worsened over the past 2 weeks. 07/04/2020 upon evaluation today patient appears to be doing well with regard to her wound on the leg. Overall I am extremely pleased with where things stand and I do think that she is making excellent progress. There is no sign of infection right now which is also great news. I think that she is close to complete resolution. 07/09/20 upon evaluation today patient appears to be doing excellent in regard to her leg ulcer. She has been tolerating the dressing changes without complication and in general I am extremely pleased with where things stand today. There does not appear to be any signs of active infection at this time which is great news. 07/25/2020 upon evaluation today patient appears to be doing well with regard to her wound. She has been tolerating the dressing changes without complication. Fortunately there is no signs of active infection at this time and her wound appears to be completely healed. Readmission: 09/30/2020 upon evaluation today patient appears  for reevaluation here in our clinic. She was discharged May 5 that unfortunately has started to have weeping and wounds on the left leg at this point. She tells me the right leg did weep some but nothing as significant as the left. With all that being said at this point the patient states that she figured it was best to come in here and she was having a lot of drainage and it was get all of her sheets and everything. She has been wearing her Velcro wraps sometimes but it does not sound like she is been wearing them all the time which I think is part of the reason why this reopened. She also unfortunately had a fall which was quite  significant. This occurred I believe about a week or 2 ago. Otherwise patient's past medical history really has not changed significantly. 10/07/2020 upon evaluation today patient appears to be doing better in regard to her wounds of the lower extremities. Fortunately there does not appear to be any signs of active infection at this time which is great news. I do not see any evidence of anything worsening but again she continues to have significant bilateral stage III lymphedema. This has been an ongoing issue. We actually have seen her this year pretty much from March through May of 2022. Following this she continued with compression at home following but then unfortunately reopened again. That was on July 11. With that being said this is an ongoing and recurrent issue for which honestly have seen her over the past 2 years pretty much consistently. Nonetheless we have had Velcro compression wraps that she uses at home and despite this she continues to have frequent and recurrent ulcerations. 7/27; patient presents for 1 week follow-up. She has no issues or complaints today. She denies signs of infection. 10/21/2020 upon evaluation today patient's leg actually appears to be doing decently well on the left there is nothing open on the right at all and I am pleased in that regard. On the left side I do think that she still has some areas of weeping I believe the compression wrap is still the best way to go although I am hopeful that we are headed in the right direction as far as getting this area to heal and silver. Upon inspection patient's wound bed actually showed signs of good granulation epithelization at this point. There does not appear to be any evidence of active infection which is great news and overall I am extremely pleased with where things stand. 11/04/2020 upon evaluation today patient appears to be doing well with regard to her left leg. In fact this is getting very close to  complete granulation and epithelization at this point I am very pleased with where we stand. I do not see any signs of infection currently. 11/18/2020 upon evaluation today patient appears to be doing well in regard to her wound. She is in fact doing extremely well considering the fact we did need to see her last week she was apparently sick and missed her appointment. She is therefore had the wrap on for 2 weeks. Fortunately there is no signs of active infection at this time. No fevers, chills, nausea, vomiting, or diarrhea. 11/28/2020 upon evaluation today patient appears to be doing well with regard to her legs. Things are healing nicely and overall I do not see any signs of infection at this time which is great news. No fevers, chills, nausea, vomiting, or diarrhea. With that being said she still has  a couple areas that are open and weeping currently. Objective Constitutional Well-nourished and well-hydrated in no acute distress. Vitals Time Taken: 12:55 PM, Temperature: 98.2 F, Pulse: 79 bpm, Respiratory Rate: 18 breaths/min, Blood Pressure: 193/67 mmHg. Respiratory normal breathing without difficulty. Psychiatric this patient is able to make decisions and demonstrates good insight into disease process. Alert and Oriented x 3. pleasant and cooperative. Delima, Lashena ([id number]) General Notes: Upon inspection patient's wound bed showed signs of good granulation epithelization at this point. Fortunately there does not appear to be any evidence of infection she has just areas of weeping and in fact I think a lot of the wounds have pretty much sealed up and again what is remaining there is not really doing too poorly overall. Integumentary (Hair, Skin) Wound #7 status is Open. Original cause of wound was Gradually Appeared. The date acquired was: 09/20/2020. The wound has been in treatment 8 weeks. The wound is located on the Left,Anterior Lower Leg. The wound measures 4.3cm length x 2cm width  x 0.1cm depth; 6.754cm^2 area and 0.675cm^3 volume. There is Fat Layer (Subcutaneous Tissue) exposed. There is a medium amount of serosanguineous drainage noted. There is medium (34-66%) granulation within the wound bed. There is a medium (34-66%) amount of necrotic tissue within the wound bed including Adherent Slough. Wound #8 status is Open. Original cause of wound was Gradually Appeared. The date acquired was: 09/20/2020. The wound has been in treatment 8 weeks. The wound is located on the Left,Posterior Lower Leg. The wound measures 3.5cm length x 2cm width x 0.1cm depth; 5.498cm^2 area and 0.55cm^3 volume. There is Fat Layer (Subcutaneous Tissue) exposed. There is no tunneling or undermining noted. There is a medium amount of serosanguineous drainage noted. There is small (1-33%) pink granulation within the wound bed. There is a large (67-100%) amount of necrotic tissue within the wound bed including Adherent Slough. 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 There was a Three Layer Compression Therapy Procedure by Carlene Coria, RN. Post procedure Diagnosis Wound #7: Same as Pre-Procedure Wound #8 Pre-procedure diagnosis of Wound #8 is a Diabetic Wound/Ulcer of the Lower Extremity located on the Left,Posterior Lower Leg . There was a Three Layer Compression Therapy Procedure by Carlene Coria, RN. Post procedure Diagnosis Wound #8: Same as Pre-Procedure Plan Follow-up Appointments: Return  Appointment in 1 week. Bathing/ Shower/ Hygiene: May shower with wound dressing protected with water repellent cover or cast protector. Edema Control - Lymphedema / Segmental Compressive Device / Other: Optional: One layer of unna paste to top of compression wrap (to act as an anchor). Patient to wear own Velcro compression garment. Remove compression stockings every night before going to bed and put on every morning when getting up. - right lower leg Elevate, Exercise Daily and Avoid Standing for Long Periods of Time. Elevate legs to the level of the heart and pump ankles as often as possible Elevate leg(s) parallel to the floor when sitting. Compression Pump: Use compression pump on left lower extremity for 60 minutes, twice daily. Compression Pump: Use compression pump on right lower extremity for 60 minutes, twice  daily. WOUND #7: - Lower Leg Wound Laterality: Left, Anterior Cleanser: Soap and Water 1 x Per Week/30 Days Discharge Instructions: Gently cleanse wound with antibacterial soap, rinse and pat dry prior to dressing wounds Primary Dressing: Silvercel 4 1/4x 4 1/4 (in/in) 1 x Per Week/30 Days Ng, Prajna ([id number]) Discharge Instructions: Apply Silvercel 4 1/4x 4 1/4 (in/in) as instructed Secondary Dressing: ABD Pad 5x9 (in/in) 1 x Per Week/30 Days Discharge Instructions: Cover with ABD pad Compression Wrap: Profore Lite LF 3 Multilayer Compression Bandaging System 1 x Per Week/30 Days Discharge Instructions: Apply 3 multi-layer wrap as prescribed. WOUND #8: - Lower Leg Wound Laterality: Left, Posterior Cleanser: Soap and Water 1 x Per Week/30 Days Discharge Instructions: Gently cleanse wound with antibacterial soap, rinse and pat dry prior to dressing wounds Primary Dressing: Silvercel 4 1/4x 4 1/4 (in/in) 1 x Per Week/30 Days Discharge Instructions: Apply Silvercel 4 1/4x 4 1/4 (in/in) as instructed Secondary Dressing: ABD Pad 5x9 (in/in) 1 x Per Week/30 Days Discharge  Instructions: Cover with ABD pad Compression Wrap: Profore Lite LF 3 Multilayer Compression Bandaging System 1 x Per Week/30 Days Discharge Instructions: Apply 3 multi-layer wrap as prescribed. Compression Stockings: Circaid Juxta Lite Compression Wrap Compression Amount: 30-40 mmHg (left) Discharge Instructions: Apply Circaid Juxta Lite Compression Wrap as directed 1. Would recommend currently that we going to continue with the wound care measures as before. We are using the silver alginate dressing to the open wound or draining areas followed by an ABD pad. 2. I am also can recommend we continue with a 3 layer compression wrap which I think is doing a great job as well. 3. I did have a discussion with the patient today that she needs to be here on time for her appointments. I discussed that obviously if she is not and continues to be late we will get have to not see her on those days as it puts everybody else behind as far as being seen. In the end she voiced understanding although we did have a little bit of a confrontation here I think we work things out appropriately and everybody was okay in the end. We will see patient back for reevaluation in 1 week here in the clinic. If anything worsens or changes patient will contact our office for additional recommendations. Electronic Signature(s) Signed: 11/29/2020 9:55:24 AM By: Worthy Keeler PA-C Entered By: Worthy Keeler on 11/29/2020 09:55:24 Brooke, Laquandra ([id number]) -------------------------------------------------------------------------------- SuperBill Details Patient Name: [name] Date of Service: 11/28/2020 Medical Record Number: [id number] Patient Account Number: [id number] Date of Birth/Sex: 15-Apr-1965 (55 y.o. F) Treating RN: Carlene Coria Primary Care Provider: Laurance Flatten Other Clinician: Referring Provider: Laurance Flatten Treating Provider/Extender: Skipper Cliche in Treatment: 8 Diagnosis Coding ICD-10 Codes Code  Description I87.2 Venous insufficiency (chronic) (peripheral) I89.0 Lymphedema, not elsewhere classified L97.822 Non-pressure chronic ulcer of other part of left lower leg with fat layer exposed E11.622 Type 2 diabetes mellitus with other skin ulcer N18.30 Chronic kidney disease, stage 3 unspecified I10 Essential (primary) hypertension I50.42 Chronic combined systolic (congestive) and diastolic (congestive) heart failure Facility Procedures CPT4 Code: 34193790 Description: (Facility Use Only) [phone] - APPLY MULTLAY COMPRS LWR LT LEG Modifier: Quantity: 1 Physician Procedures CPT4 Code: 3299242 Description: 99213 - WC PHYS LEVEL 3 - EST PT Modifier: Quantity: 1 CPT4 Code: Description: ICD-10 Diagnosis Description I87.2 Venous insufficiency (chronic) (peripheral) I89.0 Lymphedema, not elsewhere classified L97.822 Non-pressure chronic ulcer of other part of left lower leg with fat lay Modifier: er exposed  Quantity: Electronic Signature(s) Signed: 11/29/2020 9:55:37 AM By: Worthy Keeler PA-C Previous Signature: 11/28/2020 1:23:50 PM Version By: Carlene Coria RN Entered By: Worthy Keeler on 11/29/2020 09:55:36

## 2020-11-29 ENCOUNTER — Telehealth: Payer: Self-pay | Admitting: *Deleted

## 2020-11-29 DIAGNOSIS — G4733 Obstructive sleep apnea (adult) (pediatric): Secondary | ICD-10-CM

## 2020-11-29 NOTE — Telephone Encounter (Signed)
Per dr [name] patient has poor compliance with a elevated AHI on auto cpap. Refer to sleep lab for a cpap titration. Patient states she is trying very hard to get use to her cpap. She is having a lot of other problems as well. She needs more time to adjust to the device. She declines more testing at this time.

## 2020-12-02 ENCOUNTER — Ambulatory Visit: Payer: Medicare Other | Admitting: Physician Assistant

## 2020-12-03 ENCOUNTER — Ambulatory Visit: Payer: Medicare Other | Admitting: Physician Assistant

## 2020-12-04 NOTE — Progress Notes (Signed)
[name] ([id number]) Visit Report for 11/28/2020 Arrival Information Details Patient Name: [name], [name] Date of Service: 11/28/2020 12:30 PM Medical Record Number: [id number] Patient Account Number: [id number] Date of Birth/Sex: 03-Jan-1966 (55 y.o. F) Treating RN: Carlene Coria Primary Care Serinity Ware: Laurance Flatten Other Clinician: Referring Lior Cartelli: Laurance Flatten Treating Darnesha Diloreto/Extender: Skipper Cliche in Treatment: 8 Visit Information History Since Last Visit All ordered tests and consults were completed: No Patient Arrived: Ambulatory Added or deleted any medications: No Arrival Time: 12:54 Any new allergies or adverse reactions: No Accompanied By: self Had a fall or experienced change in No Transfer Assistance: None activities of daily living that may affect Patient Identification Verified: Yes risk of falls: Secondary Verification Process Completed: Yes Signs or symptoms of abuse/neglect since last visito No Patient Requires Transmission-Based Precautions: No Hospitalized since last visit: No Patient Has Alerts: No Implantable device outside of the clinic excluding No cellular tissue based products placed in the center since last visit: Has Dressing in Place as Prescribed: Yes Has Compression in Place as Prescribed: Yes Pain Present Now: No Electronic Signature(s) Signed: 12/04/2020 7:54:12 AM By: Carlene Coria RN Entered By: Carlene Coria on 11/28/2020 12:55:19 Santo, Dena ([id number]) -------------------------------------------------------------------------------- Clinic Level of Care Assessment Details Patient Name: [name], [name] Date of Service: 11/28/2020 12:30 PM Medical Record Number: [id number] Patient Account Number: [id number] Date of Birth/Sex: 06/25/1965 (55 y.o. F) Treating RN: Carlene Coria Primary Care Rande Roylance: Laurance Flatten Other Clinician: Referring Georgine Wiltse: Laurance Flatten Treating Jauan Wohl/Extender: Skipper Cliche in Treatment: 8 Clinic Level  of Care Assessment Items TOOL 1 Quantity Score []  - Use when EandM and Procedure is performed on INITIAL visit 0 ASSESSMENTS - Nursing Assessment / Reassessment []  - General Physical Exam (combine w/ comprehensive assessment (listed just below) when performed on new 0 pt. evals) []  - 0 Comprehensive Assessment (HX, ROS, Risk Assessments, Wounds Hx, etc.) ASSESSMENTS - Wound and Skin Assessment / Reassessment []  - Dermatologic / Skin Assessment (not related to wound area) 0 ASSESSMENTS - Ostomy and/or Continence Assessment and Care []  - Incontinence Assessment and Management 0 []  - 0 Ostomy Care Assessment and Management (repouching, etc.) PROCESS - Coordination of Care []  - Simple Patient / Family Education for ongoing care 0 []  - 0 Complex (extensive) Patient / Family Education for ongoing care []  - 0 Staff obtains Programmer, systems, Records, Test Results / Process Orders []  - 0 Staff telephones HHA, Nursing Homes / Clarify orders / etc []  - 0 Routine Transfer to another Facility (non-emergent condition) []  - 0 Routine Hospital Admission (non-emergent condition) []  - 0 New Admissions / Biomedical engineer / Ordering NPWT, Apligraf, etc. []  - 0 Emergency Hospital Admission (emergent condition) PROCESS - Special Needs []  - Pediatric / Minor Patient Management 0 []  - 0 Isolation Patient Management []  - 0 Hearing / Language / Visual special needs []  - 0 Assessment of Community assistance (transportation, D/C planning, etc.) []  - 0 Additional assistance / Altered mentation []  - 0 Support Surface(s) Assessment (bed, cushion, seat, etc.) INTERVENTIONS - Miscellaneous []  - External ear exam 0 []  - 0 Patient Transfer (multiple staff / Civil Service fast streamer / Similar devices) []  - 0 Simple Staple / Suture removal (25 or less) []  - 0 Complex Staple / Suture removal (26 or more) []  - 0 Hypo/Hyperglycemic Management (do not check if billed separately) []  - 0 Ankle / Brachial Index (ABI) -  do not check if billed separately Has the patient been seen at the hospital within the last three years: Yes Total  Score: 0 Level Of Care: ____ [name] ([id number]) Electronic Signature(s) Signed: 12/04/2020 7:54:12 AM By: Carlene Coria RN Entered By: Carlene Coria on 11/28/2020 13:23:40 Marceaux, [name] ([id number]) -------------------------------------------------------------------------------- Compression Therapy Details Patient Name: [name] Date of Service: 11/28/2020 12:30 PM Medical Record Number: [id number] Patient Account Number: [id number] Date of Birth/Sex: 20-Mar-1966 (55 y.o. F) Treating RN: Carlene Coria Primary Care Detria [name]: Laurance Flatten Other Clinician: Referring Angelin Cutrone: Laurance Flatten Treating Gabrille Kilbride/Extender: Skipper Cliche in Treatment: 8 Compression Therapy Performed for Wound Assessment: Wound #7 Left,Anterior Lower Leg Performed By: Clinician Carlene Coria, RN Compression Type: Three Layer Post Procedure Diagnosis Same as Pre-procedure Electronic Signature(s) Signed: 11/28/2020 1:23:26 PM By: Carlene Coria RN Entered By: Carlene Coria on 11/28/2020 13:23:26 Cullop, Linzi ([id number]) -------------------------------------------------------------------------------- Compression Therapy Details Patient Name: [name] Date of Service: 11/28/2020 12:30 PM Medical Record Number: [id number] Patient Account Number: [id number] Date of Birth/Sex: March 04, 1966 (55 y.o. F) Treating RN: Carlene Coria Primary Care Nashid Pellum: Laurance Flatten Other Clinician: Referring Joshia Kitchings: Laurance Flatten Treating Waynesha Rammel/Extender: Skipper Cliche in Treatment: 8 Compression Therapy Performed for Wound Assessment: Wound #8 Left,Posterior Lower Leg Performed By: Clinician Carlene Coria, RN Compression Type: Three Layer Post Procedure Diagnosis Same as Pre-procedure Electronic Signature(s) Signed: 11/28/2020 1:23:26 PM By: Carlene Coria RN Entered By: Carlene Coria on 11/28/2020  13:23:26 Darrah, [name] ([id number]) -------------------------------------------------------------------------------- Encounter Discharge Information Details Patient Name: [name] Date of Service: 11/28/2020 12:30 PM Medical Record Number: [id number] Patient Account Number: [id number] Date of Birth/Sex: Oct 14, 1965 (55 y.o. F) Treating RN: Carlene Coria Primary Care Foday Cone: Laurance Flatten Other Clinician: Referring Orlo Brickle: Laurance Flatten Treating Caprice Wasko/Extender: Skipper Cliche in Treatment: 8 Encounter Discharge Information Items Discharge Condition: Stable Ambulatory Status: Ambulatory Discharge Destination: Home Transportation: Private Auto Accompanied By: self Schedule Follow-up Appointment: Yes Clinical Summary of Care: Patient Declined Electronic Signature(s) Signed: 11/28/2020 1:24:37 PM By: Carlene Coria RN Entered By: Carlene Coria on 11/28/2020 13:24:37 Hands, [name] ([id number]) -------------------------------------------------------------------------------- Lower Extremity Assessment Details Patient Name: [name] Date of Service: 11/28/2020 12:30 PM Medical Record Number: [id number] Patient Account Number: [id number] Date of Birth/Sex: [birth year] (55 y.o. F) Treating RN: Carlene Coria Primary Care Delta Deshmukh: Laurance Flatten Other Clinician: Referring Alen Matheson: Laurance Flatten Treating Daphnee Preiss/Extender: Jeri Cos Weeks in Treatment: 8 Edema Assessment Assessed: [Left: No] [Right: No] Edema: [Left: Ye] [Right: s] Calf Left: Right: Point of Measurement: 37 cm From Medial Instep 47 cm Ankle Left: Right: Point of Measurement: 9 cm From Medial Instep 27 cm Vascular Assessment Pulses: Dorsalis Pedis Palpable: [Left:Yes] Electronic Signature(s) Signed: 12/04/2020 7:54:12 AM By: Carlene Coria RN Entered By: Carlene Coria on 11/28/2020 13:03:34 Sheahan, Christena ([id number]) -------------------------------------------------------------------------------- Multi Wound  Chart Details Patient Name: [name] Date of Service: 11/28/2020 12:30 PM Medical Record Number: [id number] Patient Account Number: [id number] Date of Birth/Sex: Mar 30, 1965 (55 y.o. F) Treating RN: Carlene Coria Primary Care Franciscojavier Wronski: Laurance Flatten Other Clinician: Referring Devota Viruet: Laurance Flatten Treating Tagen Brethauer/Extender: Skipper Cliche in Treatment: 8 Vital Signs Height(in): Pulse(bpm): 62 Weight(lbs): Blood Pressure(mmHg): 193/67 Body Mass Index(BMI): Temperature(F): 98.2 Respiratory Rate(breaths/min): 18 Photos: [N/A:N/A] Wound Location: Left, Anterior Lower Leg Left, Posterior Lower Leg N/A Wounding Event: Gradually Appeared Gradually Appeared N/A Primary Etiology: Diabetic Wound/Ulcer of the Lower Diabetic Wound/Ulcer of the Lower N/A Extremity Extremity Comorbid History: Congestive Heart Failure, Coronary Congestive Heart Failure, Coronary N/A Artery Disease, Hypertension, Artery Disease, Hypertension, Myocardial Infarction, Peripheral Myocardial Infarction, Peripheral Venous Disease, Type II Diabetes, Venous Disease, Type II Diabetes, End Stage Renal Disease, End Stage Renal Disease, Neuropathy Neuropathy Date Acquired: 09/20/2020  09/20/2020 N/A Weeks of Treatment: 8 8 N/A Wound Status: Open Open N/A Measurements L x W x D (cm) 4.3x2x0.1 3.5x2x0.1 N/A Area (cm) : 6.754 5.498 N/A Volume (cm) : 0.675 0.55 N/A % Reduction in Area: 80.50% -86.70% N/A % Reduction in Volume: 80.50% -86.40% N/A Classification: Grade 2 Grade 2 N/A Exudate Amount: Medium Medium N/A Exudate Type: Serosanguineous Serosanguineous N/A Exudate Color: red, brown red, brown N/A Granulation Amount: Medium (34-66%) Small (1-33%) N/A Granulation Quality: N/A Pink N/A Necrotic Amount: Medium (34-66%) Large (67-100%) N/A Exposed Structures: Fat Layer (Subcutaneous Tissue): Fat Layer (Subcutaneous Tissue): N/A Yes Yes Fascia: No Fascia: No Tendon: No Tendon: No Muscle: No Muscle: No Joint:  No Joint: No Bone: No Bone: No Epithelialization: None None N/A Treatment Notes Electronic Signature(s) Signed: 12/04/2020 7:54:12 AM By: Carlene Coria RN Entered By: Carlene Coria on 11/28/2020 13:08:02 Idler, Taunia ([id number]) LORANDA, MASTEL ([id number]) -------------------------------------------------------------------------------- [place] Details Patient Name: [name] Date of Service: 11/28/2020 12:30 PM Medical Record Number: [id number] Patient Account Number: [id number] Date of Birth/Sex: [birth year] (55 y.o. F) Treating RN: Carlene Coria Primary Care Ernisha Sorn: Laurance Flatten Other Clinician: Referring Tayra Dawe: Laurance Flatten Treating Mikelle Myrick/Extender: Skipper Cliche in Treatment: 8 Active Inactive Abuse / Safety / Falls / Self Care Management Nursing Diagnoses: Potential for injury related to falls Goals: Patient will remain injury free related to falls Date Initiated: 09/30/2020 Target Resolution Date: 10/31/2020 Goal Status: Active Interventions: Assess Activities of Daily Living upon admission and as needed Assess fall risk on admission and as needed Assess: immobility, friction, shearing, incontinence upon admission and as needed Assess impairment of mobility on admission and as needed per policy Assess personal safety and home safety (as indicated) on admission and as needed Assess self care needs on admission and as needed Notes: Electronic Signature(s) Signed: 12/04/2020 7:54:12 AM By: Carlene Coria RN Entered By: Carlene Coria on 11/28/2020 13:07:49 Lull, Darchelle ([id number]) -------------------------------------------------------------------------------- Pain Assessment Details Patient Name: [name] Date of Service: 11/28/2020 12:30 PM Medical Record Number: [id number] Patient Account Number: [id number] Date of Birth/Sex: 1966/01/21 (55 y.o. F) Treating RN: Carlene Coria Primary Care Alyxandria Wentz: Laurance Flatten Other  Clinician: Referring Olinda Nola: Laurance Flatten Treating Kellan Boehlke/Extender: Skipper Cliche in Treatment: 8 Active Problems Location of Pain Severity and Description of Pain Patient Has Paino No Site Locations Pain Management and Medication Current Pain Management: Electronic Signature(s) Signed: 12/04/2020 7:54:12 AM By: Carlene Coria RN Entered By: Carlene Coria on 11/28/2020 12:56:55 Graeff, [name] ([id number]) -------------------------------------------------------------------------------- Patient/Caregiver Education Details Patient Name: [name] Date of Service: 11/28/2020 12:30 PM Medical Record Number: [id number] Patient Account Number: [id number] Date of Birth/Gender: Sep 13, 1965 (55 y.o. F) Treating RN: Carlene Coria Primary Care Physician: Laurance Flatten Other Clinician: Referring Physician: Laurance Flatten Treating Physician/Extender: Skipper Cliche in Treatment: 8 Education Assessment Education Provided To: Patient Education Topics Provided Wound/Skin Impairment: Methods: Explain/Verbal Responses: State content correctly Electronic Signature(s) Signed: 12/04/2020 7:54:12 AM By: Carlene Coria RN Entered By: Carlene Coria on 11/28/2020 13:24:01 Arboleda, Loukisha ([id number]) -------------------------------------------------------------------------------- Wound Assessment Details Patient Name: [name] Date of Service: 11/28/2020 12:30 PM Medical Record Number: [id number] Patient Account Number: [id number] Date of Birth/Sex: 1965-07-18 (55 y.o. F) Treating RN: Carlene Coria Primary Care Adam Sanjuan: Laurance Flatten Other Clinician: Referring Daxtin Leiker: Laurance Flatten Treating Nickalas Mccarrick/Extender: Jeri Cos Weeks in Treatment: 8 Wound Status Wound Number: 7 Primary Diabetic Wound/Ulcer of the Lower Extremity Etiology: Wound Location: Left, Anterior Lower Leg Wound Open Wounding Event: Gradually Appeared Status: Date Acquired: 09/20/2020 Comorbid Congestive Heart Failure, Coronary  Artery Disease, Weeks Of Treatment: 8 History: Hypertension, Myocardial Infarction, Peripheral Venous Clustered Wound: No Disease, Type II Diabetes, End Stage Renal Disease, Neuropathy Photos Wound Measurements Length: (cm) 4.3 Width: (cm) 2 Depth: (cm) 0.1 Area: (cm) 6.754 Volume: (cm) 0.675 % Reduction in Area: 80.5% % Reduction in Volume: 80.5% Epithelialization: None Wound Description Classification: Grade 2 Exudate Amount: Medium Exudate Type: Serosanguineous Exudate Color: red, brown Foul Odor After Cleansing: No Wound Bed Granulation Amount: Medium (34-66%) Exposed Structure Necrotic Amount: Medium (34-66%) Fascia Exposed: No Necrotic Quality: Adherent Slough Fat Layer (Subcutaneous Tissue) Exposed: Yes Tendon Exposed: No Muscle Exposed: No Joint Exposed: No Bone Exposed: No Treatment Notes Wound #7 (Lower Leg) Wound Laterality: Left, Anterior Cleanser Soap and Water Discharge Instruction: Gently cleanse wound with antibacterial soap, rinse and pat dry prior to dressing wounds Peri-Wound Care Monacelli, Sweet ([id number]) Topical Primary Dressing Silvercel 4 1/4x 4 1/4 (in/in) Discharge Instruction: Apply Silvercel 4 1/4x 4 1/4 (in/in) as instructed Secondary Dressing ABD Pad 5x9 (in/in) Discharge Instruction: Cover with ABD pad Secured With Compression Wrap Profore Lite LF 3 Multilayer Compression Bandaging System Discharge Instruction: Apply 3 multi-layer wrap as prescribed. Compression Stockings Add-Ons Electronic Signature(s) Signed: 12/04/2020 7:54:12 AM By: Carlene Coria RN Entered By: Carlene Coria on 11/28/2020 13:02:31 Tennis, Delanda ([id number]) -------------------------------------------------------------------------------- Wound Assessment Details Patient Name: [name] Date of Service: 11/28/2020 12:30 PM Medical Record Number: [id number] Patient Account Number: [id number] Date of Birth/Sex: February 08, 1966 (55 y.o. F) Treating RN: Carlene Coria Primary Care Lydia Meng: Laurance Flatten Other Clinician: Referring Jacorey Donaway: Laurance Flatten Treating Tamya Denardo/Extender: Jeri Cos Weeks in Treatment: 8 Wound Status Wound Number: 8 Primary Diabetic Wound/Ulcer of the Lower Extremity Etiology: Wound Location: Left, Posterior Lower Leg Wound Open Wounding Event: Gradually Appeared Status: Date Acquired: 09/20/2020 Comorbid Congestive Heart Failure, Coronary Artery Disease, Weeks Of Treatment: 8 History: Hypertension, Myocardial Infarction, Peripheral Venous Clustered Wound: No Disease, Type II Diabetes, End Stage Renal Disease, Neuropathy Photos Wound Measurements Length: (cm) 3.5 Width: (cm) 2 Depth: (cm) 0.1 Area: (cm) 5.498 Volume: (cm) 0.55 % Reduction in Area: -86.7% % Reduction in Volume: -86.4% Epithelialization: None Tunneling: No Undermining: No Wound Description Classification: Grade 2 Exudate Amount: Medium Exudate Type: Serosanguineous Exudate Color: red, brown Foul Odor After Cleansing: No Slough/Fibrino Yes Wound Bed Granulation Amount: Small (1-33%) Exposed Structure Granulation Quality: Pink Fascia Exposed: No Necrotic Amount: Large (67-100%) Fat Layer (Subcutaneous Tissue) Exposed: Yes Necrotic Quality: Adherent Slough Tendon Exposed: No Muscle Exposed: No Joint Exposed: No Bone Exposed: No Treatment Notes Wound #8 (Lower Leg) Wound Laterality: Left, Posterior Cleanser Soap and Water Discharge Instruction: Gently cleanse wound with antibacterial soap, rinse and pat dry prior to dressing wounds Peri-Wound Care Kooi, Hollyanne ([id number]) Topical Primary Dressing Silvercel 4 1/4x 4 1/4 (in/in) Discharge Instruction: Apply Silvercel 4 1/4x 4 1/4 (in/in) as instructed Secondary Dressing ABD Pad 5x9 (in/in) Discharge Instruction: Cover with ABD pad Secured With Compression Wrap Profore Lite LF 3 Multilayer Compression Bandaging System Discharge Instruction: Apply 3 multi-layer wrap as  prescribed. Compression Stockings Circaid Juxta Lite Compression Wrap Quantity: 1 Left Leg Compression Amount: 30-40 mmHg Discharge Instruction: Apply Circaid Juxta Lite Compression Wrap as directed Add-Ons Electronic Signature(s) Signed: 12/04/2020 7:54:12 AM By: Carlene Coria RN Entered By: Carlene Coria on 11/28/2020 13:02:55 Grimes, Jewelianna ([id number]) -------------------------------------------------------------------------------- Vitals Details Patient Name: [name] Date of Service: 11/28/2020 12:30 PM Medical Record Number: [id number] Patient Account Number: [id number] Date of Birth/Sex: Feb 06, 1966 (55 y.o. F) Treating RN: Carlene Coria Primary Care Kymoni Monday: Laurance Flatten Other  Clinician: Referring Guage Efferson: Laurance Flatten Treating Zavannah Deblois/Extender: Skipper Cliche in Treatment: 8 Vital Signs Time Taken: 12:55 Temperature (F): 98.2 Pulse (bpm): 79 Respiratory Rate (breaths/min): 18 Blood Pressure (mmHg): 193/67 Reference Range: 80 - 120 mg / dl Electronic Signature(s) Signed: 12/04/2020 7:54:12 AM By: Carlene Coria RN Entered By: Carlene Coria on 11/28/2020 12:56:49

## 2020-12-09 ENCOUNTER — Ambulatory Visit: Payer: Medicare Other | Admitting: Physician Assistant

## 2020-12-10 ENCOUNTER — Other Ambulatory Visit: Payer: Self-pay

## 2020-12-10 ENCOUNTER — Encounter: Payer: Medicare Other | Admitting: Physician Assistant

## 2020-12-10 DIAGNOSIS — I872 Venous insufficiency (chronic) (peripheral): Secondary | ICD-10-CM | POA: Diagnosis not present

## 2020-12-10 NOTE — Progress Notes (Addendum)
[name] ([id number]) Visit Report for 12/10/2020 Arrival Information Details Patient Name: [name], [name] Date of Service: 12/10/2020 2:30 PM Medical Record Number: [id number] Patient Account Number: [id number] Date of Birth/Sex: [birth year] (55 y.o. F) Treating RN: Donnamarie Poag Primary Care Tamzin Bertling: Laurance Flatten Other Clinician: Referring Danila Eddie: Laurance Flatten Treating Amaliya Whitelaw/Extender: Skipper Cliche in Treatment: 10 Visit Information History Since Last Visit Added or deleted any medications: No Patient Arrived: Ambulatory Had a fall or experienced change in No Arrival Time: 14:45 activities of daily living that may affect Accompanied By: self risk of falls: Transfer Assistance: None Hospitalized since last visit: No Patient Identification Verified: Yes Has Dressing in Place as Prescribed: Yes Secondary Verification Process Completed: Yes Has Compression in Place as Prescribed: Yes Patient Requires Transmission-Based Precautions: No Pain Present Now: Yes Patient Has Alerts: No Electronic Signature(s) Signed: 12/10/2020 4:22:54 PM By: Donnamarie Poag Entered By: Donnamarie Poag on 12/10/2020 14:46:20 Mimnaugh, Creasie ([id number]) -------------------------------------------------------------------------------- Clinic Level of Care Assessment Details Patient Name: [name], [name] Date of Service: 12/10/2020 2:30 PM Medical Record Number: [id number] Patient Account Number: [id number] Date of Birth/Sex: 02-20-1966 (55 y.o. F) Treating RN: Carlene Coria Primary Care Caige Almeda: Laurance Flatten Other Clinician: Referring Ilah Boule: Laurance Flatten Treating Jakyria Bleau/Extender: Skipper Cliche in Treatment: 10 Clinic Level of Care Assessment Items TOOL 1 Quantity Score []  - Use when EandM and Procedure is performed on INITIAL visit 0 ASSESSMENTS - Nursing Assessment / Reassessment []  - General Physical Exam (combine w/ comprehensive assessment (listed just below) when performed on new 0 pt.  evals) []  - 0 Comprehensive Assessment (HX, ROS, Risk Assessments, Wounds Hx, etc.) ASSESSMENTS - Wound and Skin Assessment / Reassessment []  - Dermatologic / Skin Assessment (not related to wound area) 0 ASSESSMENTS - Ostomy and/or Continence Assessment and Care []  - Incontinence Assessment and Management 0 []  - 0 Ostomy Care Assessment and Management (repouching, etc.) PROCESS - Coordination of Care []  - Simple Patient / Family Education for ongoing care 0 []  - 0 Complex (extensive) Patient / Family Education for ongoing care []  - 0 Staff obtains Programmer, systems, Records, Test Results / Process Orders []  - 0 Staff telephones HHA, Nursing Homes / Clarify orders / etc []  - 0 Routine Transfer to another Facility (non-emergent condition) []  - 0 Routine Hospital Admission (non-emergent condition) []  - 0 New Admissions / Biomedical engineer / Ordering NPWT, Apligraf, etc. []  - 0 Emergency Hospital Admission (emergent condition) PROCESS - Special Needs []  - Pediatric / Minor Patient Management 0 []  - 0 Isolation Patient Management []  - 0 Hearing / Language / Visual special needs []  - 0 Assessment of Community assistance (transportation, D/C planning, etc.) []  - 0 Additional assistance / Altered mentation []  - 0 Support Surface(s) Assessment (bed, cushion, seat, etc.) INTERVENTIONS - Miscellaneous []  - External ear exam 0 []  - 0 Patient Transfer (multiple staff / Civil Service fast streamer / Similar devices) []  - 0 Simple Staple / Suture removal (25 or less) []  - 0 Complex Staple / Suture removal (26 or more) []  - 0 Hypo/Hyperglycemic Management (do not check if billed separately) []  - 0 Ankle / Brachial Index (ABI) - do not check if billed separately Has the patient been seen at the hospital within the last three years: Yes Total Score: 0 Level Of Care: ____ [name] ([id number]) Electronic Signature(s) Signed: 12/12/2020 1:17:18 PM By: Carlene Coria RN Entered By: Carlene Coria on  12/10/2020 15:47:25 Robey, Valinda ([id number]) -------------------------------------------------------------------------------- Compression Therapy Details Patient Name: [name] Date of Service: 12/10/2020 2:30 PM Medical  Record Number: [id number] Patient Account Number: [id number] Date of Birth/Sex: 1965/06/01 (55 y.o. F) Treating RN: Carlene Coria Primary Care Meagan Spease: Laurance Flatten Other Clinician: Referring Saina Waage: Laurance Flatten Treating Kiyon Fidalgo/Extender: Skipper Cliche in Treatment: 10 Compression Therapy Performed for Wound Assessment: Wound #7 Left,Anterior Lower Leg Performed By: Clinician Carlene Coria, RN Compression Type: Four Layer Post Procedure Diagnosis Same as Pre-procedure Electronic Signature(s) Signed: 12/10/2020 3:45:42 PM By: Carlene Coria RN Entered By: Carlene Coria on 12/10/2020 15:45:42 Gravlin, Shayann ([id number]) -------------------------------------------------------------------------------- Compression Therapy Details Patient Name: [name] Date of Service: 12/10/2020 2:30 PM Medical Record Number: [id number] Patient Account Number: [id number] Date of Birth/Sex: May 16, 1965 (55 y.o. F) Treating RN: Carlene Coria Primary Care Shaylon Aden: Laurance Flatten Other Clinician: Referring Elgar Scoggins: Laurance Flatten Treating Taber Sweetser/Extender: Skipper Cliche in Treatment: 10 Compression Therapy Performed for Wound Assessment: Wound #8 Left,Posterior Lower Leg Performed By: Clinician Carlene Coria, RN Compression Type: Four Layer Post Procedure Diagnosis Same as Pre-procedure Electronic Signature(s) Signed: 12/10/2020 3:46:02 PM By: Carlene Coria RN Entered By: Carlene Coria on 12/10/2020 15:46:02 Knobel, Wateen ([id number]) -------------------------------------------------------------------------------- Encounter Discharge Information Details Patient Name: [name] Date of Service: 12/10/2020 2:30 PM Medical Record Number: [id number] Patient Account  Number: [id number] Date of Birth/Sex: 11-Mar-1966 (55 y.o. F) Treating RN: Carlene Coria Primary Care Cortny Bambach: Laurance Flatten Other Clinician: Referring Emilya Justen: Laurance Flatten Treating Nel Stoneking/Extender: Skipper Cliche in Treatment: 10 Encounter Discharge Information Items Discharge Condition: Stable Ambulatory Status: Ambulatory Discharge Destination: Home Transportation: Private Auto Accompanied By: self Schedule Follow-up Appointment: Yes Clinical Summary of Care: Patient Declined Electronic Signature(s) Signed: 12/10/2020 3:48:46 PM By: Carlene Coria RN Entered By: Carlene Coria on 12/10/2020 15:48:46 Johanson, Hosanna ([id number]) -------------------------------------------------------------------------------- Lower Extremity Assessment Details Patient Name: [name] Date of Service: 12/10/2020 2:30 PM Medical Record Number: [id number] Patient Account Number: [id number] Date of Birth/Sex: 1965-10-31 (55 y.o. F) Treating RN: Donnamarie Poag Primary Care Kewana Sanon: Laurance Flatten Other Clinician: Referring Arielys Wandersee: Laurance Flatten Treating Dearis Danis/Extender: Jeri Cos Weeks in Treatment: 10 Edema Assessment Assessed: Shirlyn Goltz: Yes] Patrice Paradise: No] [Left: Edema] [Right: :] Calf Left: Right: Point of Measurement: 37 cm From Medial Instep 47 cm Ankle Left: Right: Point of Measurement: 9 cm From Medial Instep 27 cm Knee To Floor Left: Right: From Medial Instep 38 cm Vascular Assessment Pulses: Dorsalis Pedis Palpable: [Left:Yes] Electronic Signature(s) Signed: 12/10/2020 4:22:54 PM By: Donnamarie Poag Entered By: Donnamarie Poag on 12/10/2020 14:57:58 Preuss, Amabel ([id number]) -------------------------------------------------------------------------------- Multi Wound Chart Details Patient Name: [name] Date of Service: 12/10/2020 2:30 PM Medical Record Number: [id number] Patient Account Number: [id number] Date of Birth/Sex: 1965/05/25 (55 y.o. F) Treating RN: Donnamarie Poag Primary Care  Lynsi Dooner: Laurance Flatten Other Clinician: Referring Loida Calamia: Laurance Flatten Treating Jermia Rigsby/Extender: Skipper Cliche in Treatment: 10 Vital Signs Height(in): Pulse(bpm): 19 Weight(lbs): Blood Pressure(mmHg): 137/72 Body Mass Index(BMI): Temperature(F): 99.4 Respiratory Rate(breaths/min): 18 Photos: [N/A:N/A] Wound Location: Left, Anterior Lower Leg Left, Posterior Lower Leg N/A Wounding Event: Gradually Appeared Gradually Appeared N/A Primary Etiology: Diabetic Wound/Ulcer of the Lower Diabetic Wound/Ulcer of the Lower N/A Extremity Extremity Comorbid History: Congestive Heart Failure, Coronary Congestive Heart Failure, Coronary N/A Artery Disease, Hypertension, Artery Disease, Hypertension, Myocardial Infarction, Peripheral Myocardial Infarction, Peripheral Venous Disease, Type II Diabetes, Venous Disease, Type II Diabetes, End Stage Renal Disease, End Stage Renal Disease, Neuropathy Neuropathy Date Acquired: 09/20/2020 09/20/2020 N/A Weeks of Treatment: 10 10 N/A Wound Status: Open Open N/A Measurements L x W x D (cm) 4x1.5x0.1 3.5x2.3x0.1 N/A Area (cm) : 4.712 6.322 N/A Volume (cm) : 0.471 0.632 N/A %  Reduction in Area: 86.40% -114.70% N/A % Reduction in Volume: 86.40% -114.20% N/A Classification: Grade 2 Grade 2 N/A Exudate Amount: Medium Medium N/A Exudate Type: Serosanguineous Serosanguineous N/A Exudate Color: red, brown red, brown N/A Granulation Amount: Medium (34-66%) Medium (34-66%) N/A Granulation Quality: Pink Pink, Pale N/A Necrotic Amount: Medium (34-66%) Medium (34-66%) N/A Exposed Structures: Fat Layer (Subcutaneous Tissue): Fat Layer (Subcutaneous Tissue): N/A Yes Yes Fascia: No Fascia: No Tendon: No Tendon: No Muscle: No Muscle: No Joint: No Joint: No Bone: No Bone: No Epithelialization: None None N/A Treatment Notes Electronic Signature(s) Signed: 12/10/2020 4:22:54 PM By: Donnamarie Poag Entered By: Donnamarie Poag on 12/10/2020 14:59:16 Korf,  Shacoria ([id number]) Pinard, [name] ([id number]) -------------------------------------------------------------------------------- Multi-Disciplinary Care Plan Details Patient Name: [name] Date of Service: 12/10/2020 2:30 PM Medical Record Number: [id number] Patient Account Number: [id number] Date of Birth/Sex: [birth year] (55 y.o. F) Treating RN: Donnamarie Poag Primary Care Legion Discher: Laurance Flatten Other Clinician: Referring Tashanna Dolin: Laurance Flatten Treating Kelcey Wickstrom/Extender: Jeri Cos Weeks in Treatment: 10 Active Inactive Electronic Signature(s) Signed: 12/10/2020 4:22:54 PM By: Donnamarie Poag Entered By: Donnamarie Poag on 12/10/2020 14:58:09 Bacorn, Jeniece ([id number]) -------------------------------------------------------------------------------- Pain Assessment Details Patient Name: [name] Date of Service: 12/10/2020 2:30 PM Medical Record Number: [id number] Patient Account Number: [id number] Date of Birth/Sex: [birth year] (55 y.o. F) Treating RN: Donnamarie Poag Primary Care Maybel Dambrosio: Laurance Flatten Other Clinician: Referring Mckaela Howley: Laurance Flatten Treating Veneta Sliter/Extender: Skipper Cliche in Treatment: 10 Active Problems Location of Pain Severity and Description of Pain Patient Has Paino Yes Site Locations Pain Location: Generalized Pain, Pain in Ulcers Rate the pain. Current Pain Level: 6 Pain Management and Medication Current Pain Management: Electronic Signature(s) Signed: 12/10/2020 4:22:54 PM By: Donnamarie Poag Entered By: Donnamarie Poag on 12/10/2020 14:47:47 Touchton, Florabelle ([id number]) -------------------------------------------------------------------------------- Patient/Caregiver Education Details Patient Name: [name] Date of Service: 12/10/2020 2:30 PM Medical Record Number: [id number] Patient Account Number: [id number] Date of Birth/Gender: [birth year] (55 y.o. F) Treating RN: Carlene Coria Primary Care Physician: Laurance Flatten Other Clinician: Referring  Physician: Laurance Flatten Treating Physician/Extender: Skipper Cliche in Treatment: 10 Education Assessment Education Provided To: Patient Education Topics Provided Wound/Skin Impairment: Methods: Explain/Verbal Responses: State content correctly Electronic Signature(s) Signed: 12/12/2020 1:17:18 PM By: Carlene Coria RN Entered By: Carlene Coria on 12/10/2020 15:48:02 [place], [place] ([id number]) -------------------------------------------------------------------------------- Wound Assessment Details Patient Name: [name] Date of Service: 12/10/2020 2:30 PM Medical Record Number: [id number] Patient Account Number: [id number] Date of Birth/Sex: 05/12/1965 (55 y.o. F) Treating RN: Donnamarie Poag Primary Care Maham Quintin: Laurance Flatten Other Clinician: Referring Trysten Berti: Laurance Flatten Treating Asheton Viramontes/Extender: Jeri Cos Weeks in Treatment: 10 Wound Status Wound Number: 7 Primary Diabetic Wound/Ulcer of the Lower Extremity Etiology: Wound Location: Left, Anterior Lower Leg Wound Open Wounding Event: Gradually Appeared Status: Date Acquired: 09/20/2020 Comorbid Congestive Heart Failure, Coronary Artery Disease, Weeks Of Treatment: 10 History: Hypertension, Myocardial Infarction, Peripheral Venous Clustered Wound: No Disease, Type II Diabetes, End Stage Renal Disease, Neuropathy Photos Wound Measurements Length: (cm) 4 Width: (cm) 1.5 Depth: (cm) 0.1 Area: (cm) 4.712 Volume: (cm) 0.471 % Reduction in Area: 86.4% % Reduction in Volume: 86.4% Epithelialization: None Tunneling: No Undermining: No Wound Description Classification: Grade 2 Exudate Amount: Medium Exudate Type: Serosanguineous Exudate Color: red, brown Foul Odor After Cleansing: No Wound Bed Granulation Amount: Medium (34-66%) Exposed Structure Granulation Quality: Pink Fascia Exposed: No Necrotic Amount: Medium (34-66%) Fat Layer (Subcutaneous Tissue) Exposed: Yes Necrotic Quality: Adherent  Slough Tendon Exposed: No Muscle Exposed: No Joint Exposed: No Bone Exposed: No Treatment Notes Wound #7 (Lower  Leg) Wound Laterality: Left, Anterior Cleanser Soap and Water Discharge Instruction: Gently cleanse wound with antibacterial soap, rinse and pat dry prior to dressing wounds Peri-Wound Care Tolen, Denaja ([id number]) Topical Primary Dressing Silvercel 4 1/4x 4 1/4 (in/in) Discharge Instruction: Apply Silvercel 4 1/4x 4 1/4 (in/in) as instructed Secondary Dressing ABD Pad 5x9 (in/in) Discharge Instruction: Cover with ABD pad Secured With Compression Wrap Profore LF [place] Discharge Instruction: Apply 4 multi-layer wrap as directed. Compression Stockings Add-Ons Electronic Signature(s) Signed: 12/10/2020 4:22:54 PM By: Donnamarie Poag Entered By: Donnamarie Poag on 12/10/2020 14:55:54 Deal, Layonna ([id number]) -------------------------------------------------------------------------------- Wound Assessment Details Patient Name: [name] Date of Service: 12/10/2020 2:30 PM Medical Record Number: [id number] Patient Account Number: [id number] Date of Birth/Sex: [birth year] (55 y.o. F) Treating RN: Donnamarie Poag Primary Care Willistine Ferrall: Laurance Flatten Other Clinician: Referring Nadja Lina: Laurance Flatten Treating Adelaine Roppolo/Extender: Jeri Cos Weeks in Treatment: 10 Wound Status Wound Number: 8 Primary Diabetic Wound/Ulcer of the Lower Extremity Etiology: Wound Location: Left, Posterior Lower Leg Wound Open Wounding Event: Gradually Appeared Status: Date Acquired: 09/20/2020 Comorbid Congestive Heart Failure, Coronary Artery Disease, Weeks Of Treatment: 10 History: Hypertension, Myocardial Infarction, Peripheral Venous Clustered Wound: No Disease, Type II Diabetes, End Stage Renal Disease, Neuropathy Photos Wound Measurements Length: (cm) 3.5 Width: (cm) 2.3 Depth: (cm) 0.1 Area: (cm) 6.322 Volume: (cm) 0.632 % Reduction in Area:  -114.7% % Reduction in Volume: -114.2% Epithelialization: None Tunneling: No Undermining: No Wound Description Classification: Grade 2 Exudate Amount: Medium Exudate Type: Serosanguineous Exudate Color: red, brown Foul Odor After Cleansing: No Slough/Fibrino Yes Wound Bed Granulation Amount: Medium (34-66%) Exposed Structure Granulation Quality: Pink, Pale Fascia Exposed: No Necrotic Amount: Medium (34-66%) Fat Layer (Subcutaneous Tissue) Exposed: Yes Necrotic Quality: Adherent Slough Tendon Exposed: No Muscle Exposed: No Joint Exposed: No Bone Exposed: No Treatment Notes Wound #8 (Lower Leg) Wound Laterality: Left, Posterior Cleanser Soap and Water Discharge Instruction: Gently cleanse wound with antibacterial soap, rinse and pat dry prior to dressing wounds Peri-Wound Care Stormer, Haidy ([id number]) Topical Primary Dressing Silvercel 4 1/4x 4 1/4 (in/in) Discharge Instruction: Apply Silvercel 4 1/4x 4 1/4 (in/in) as instructed Secondary Dressing ABD Pad 5x9 (in/in) Discharge Instruction: Cover with ABD pad Secured With Compression Wrap Medichoice 4 layer Compression System, 35-40 mmHG Discharge Instruction: Apply multi-layer wrap as directed. Compression Stockings Circaid Juxta Lite Compression Wrap Quantity: 1 Left Leg Compression Amount: 30-40 mmHg Discharge Instruction: Apply Circaid Juxta Lite Compression Wrap as directed Add-Ons Electronic Signature(s) Signed: 12/10/2020 4:22:54 PM By: Donnamarie Poag Entered By: Donnamarie Poag on 12/10/2020 14:56:48 Reihl, Mikaylah ([id number]) -------------------------------------------------------------------------------- [place] Details Patient Name: [name] Date of Service: 12/10/2020 2:30 PM Medical Record Number: [id number] Patient Account Number: [id number] Date of Birth/Sex: 09/08/1965 (55 y.o. F) Treating RN: Donnamarie Poag Primary Care Rylea Selway: Laurance Flatten Other Clinician: Referring Jazmynn Pho: Laurance Flatten Treating  Abb Gobert/Extender: Jeri Cos Weeks in Treatment: 10 Vital Signs Time Taken: 14:45 Temperature (F): 99.4 Pulse (bpm): 97 Respiratory Rate (breaths/min): 18 Blood Pressure (mmHg): 137/72 Reference Range: 80 - 120 mg / dl Electronic Signature(s) Signed: 12/10/2020 4:22:54 PM By: Donnamarie Poag Entered ByDonnamarie Poag on 12/10/2020 14:47:26

## 2020-12-10 NOTE — Progress Notes (Addendum)
[name] ([id number]) Visit Report for 12/10/2020 Chief Complaint Document Details Patient Name: [name], [name] Date of Service: 12/10/2020 2:30 PM Medical Record Number: [id number] Patient Account Number: [id number] Date of Birth/Sex: [birth year] (55 y.o. F) Treating RN: Donnamarie Poag Primary Care Provider: Laurance Flatten Other Clinician: Referring Provider: Laurance Flatten Treating Provider/Extender: Skipper Cliche in Treatment: 10 Information Obtained from: Patient Chief Complaint Left LE Ulcer Electronic Signature(s) Signed: 12/10/2020 2:39:04 PM By: Worthy Keeler PA-C Entered By: Worthy Keeler on 12/10/2020 14:39:04 [name], [name] ([id number]) -------------------------------------------------------------------------------- HPI Details Patient Name: [name] Date of Service: 12/10/2020 2:30 PM Medical Record Number: [id number] Patient Account Number: [id number] Date of Birth/Sex: Aug 15, 1965 (55 y.o. F) Treating RN: Donnamarie Poag Primary Care Provider: Laurance Flatten Other Clinician: Referring Provider: Laurance Flatten Treating Provider/Extender: Skipper Cliche in Treatment: 10 History of Present Illness HPI Description: 10/24/2018 on evaluation today patient presents for initial evaluation or clinic concerning issues that she has been having with lymphedema for quite some time. Unfortunately she has several wound openings at this point that secondary to her lymphedema/venous stasis are giving her trouble and leaking quite severely. She also has diabetes along with hypertension and stage III chronic kidney disease. Fortunately there is no signs of active infection at this time. She is going to likely require debridement of the left leg ulcer upon evaluation today just based on what I am seeing. Fortunately there is no wound opening on the right. Overall the patient seems to be doing quite well and again there is no evidence of systemic infection which is good news. No fevers, chills,  nausea, vomiting, or diarrhea. 10/31/2018 on evaluation today patient actually appears to be doing excellent in regard to her lower extremity ulcers. She has been tolerating the dressing changes without complication. Fortunately there is no signs of active infection at this time. She has tolerated 3 layer compression wrap without complication. 11/07/2018 upon evaluation today patient actually appears to be doing very well with regard to her left lower extremity ulcers. She has been tolerating the dressing changes without complication. Fortunately there is no signs of active infection. No fevers, chills, nausea, vomiting, or diarrhea. 11/21/2018 upon evaluation today patient appears to be doing quite well with regard to her lower extremity ulcers. In fact both areas seem to be showing signs of good improvement which is excellent. She is not having as much pain as she has in the past and again has a lot of healing compared to previous visits as well. 12/05/2018 on evaluation today patient presents for follow-up concerning her ongoing issues with her bilateral lower extremity ulcers. The good news is her right lower extremity is showing signs of completely healing at this time which is great news. Fortunately there is no evidence of active infection. On the left she has just a very small area still remaining that is open at this time all in all she is very close to complete closure in my opinion. She does have compression stockings to wear at home. 12/12/2018 on evaluation today patient appears to be doing excellent in regard to her left lateral lower extremity ulcer. She has been tolerating the dressing changes without complication. Fortunately there is no signs of active infection at this time. In fact this appears to be pretty much healed at this point although again she is not 100% today. No fevers, chills, nausea, vomiting, or diarrhea. 12/19/2018 on evaluation today patient actually appears to be doing  excellent with regard to her lower extremity ulcer  in fact this appears to be completely healed today which is all some. She has done extremely well with wound care measures. No fevers, chills, nausea, vomiting, or diarrhea. ------------------------------------ 11/01/19-Readmission to the clinic Patient presents with left leg pain, wounds posterior calf, onset about 4 weeks, denies any fevers chills or shakes, has not been using anything to these wounds, was given compression stockings at last discharge from the clinic but has not been able to use them as they rolled down and cause creases Patient's history significant for type 2 diabetes insulin requiring, A1c of 6.9 lately, hypertension, chronic pain 8/18; patient readmitted that the clinic last week. She has wounds on her left lateral posterior lower leg all of this in close juxtaposition i.e. a localized site. We've been using silver alginate under 3 layer compression apparently the wound surface area is better. She was wearing compression stockings from elastic therapy but says they were falling down. As she progresses more towards healing will need to address what we use in terms of compression stockings perhaps external compression garments 11/17/2019 on evaluation today patient appears to be doing well with regard to her legs currently. She does have 2 wounds which are actually measuring much better the more posterior is actually doing very well which I am pleased with the other though smaller is not quite a small but nonetheless on the lateral aspect does seem to be improving. 11/23/2019 on evaluation today patient appears to be doing well in regard to her wounds. In fact the wound on the posterior aspect of her leg appears healed the lateral aspect is still open but extremely small. 9/15; this is a patient with chronic venous insufficiency and secondary lymphedema. She did not keep her clinic appointment last week and hence the left leg is a  lot more swollen since she had to take the wrap off at some point. She has a small weeping area on the left lateral lower leg. She has probably a juxta lite stocking for the left leg I have asked her to bring that in when she comes to her clinic appointment next week at which time she should be healed 10/6; this is a patient with chronic venous insufficiency and secondary lymphedema left greater than right. Skin changes of chronic lymphedema especially in the left leg. She has not been here in 3 weeks. She took the wrap off 2 weeks ago. She has some very old 20/30 stockings from elastic therapy in [place] she has been using. Fortunately her leg is closed. She also has a single Farrow wrap for the left leg Readmission: 06/18/2020 upon evaluation today patient presents for reevaluation here in the clinic. She is a long-term patient that we have seen intermittently when she has had flareups of issues with her lower extremity secondary to chronic venous stasis. With that being said unfortunately today she is having a significant issue with a wound which is actually quite significant over the right lower extremity. She did see dermatology they put Vaseline followed by [place] dressings on her unfortunately she is continuing to have significant issues however here with pain and in fact this wrap was extremely stuck to her upon evaluation today. Nonetheless I believe that she is going to require a little bit different approach to try to keep things from sticking and hopefully aid in getting this to heal more effectively and quickly. With that being said she does have a history of [place]artina, Joydan ([id number]) chronic venous insufficiency, lymphedema, diabetes mellitus type 2,  chronic kidney disease stage III, hypertension, and congestive heart failure. This started as an injury in mid February and has worsened over the past 2 weeks. 07/04/2020 upon evaluation today patient appears to be doing well with  regard to her wound on the leg. Overall I am extremely pleased with where things stand and I do think that she is making excellent progress. There is no sign of infection right now which is also great news. I think that she is close to complete resolution. 07/09/20 upon evaluation today patient appears to be doing excellent in regard to her leg ulcer. She has been tolerating the dressing changes without complication and in general I am extremely pleased with where things stand today. There does not appear to be any signs of active infection at this time which is great news. 07/25/2020 upon evaluation today patient appears to be doing well with regard to her wound. She has been tolerating the dressing changes without complication. Fortunately there is no signs of active infection at this time and her wound appears to be completely healed. Readmission: 09/30/2020 upon evaluation today patient appears for reevaluation here in our clinic. She was discharged May 5 that unfortunately has started to have weeping and wounds on the left leg at this point. She tells me the right leg did weep some but nothing as significant as the left. With all that being said at this point the patient states that she figured it was best to come in here and she was having a lot of drainage and it was get all of her sheets and everything. She has been wearing her Velcro wraps sometimes but it does not sound like she is been wearing them all the time which I think is part of the reason why this reopened. She also unfortunately had a fall which was quite significant. This occurred I believe about a week or 2 ago. Otherwise patient's past medical history really has not changed significantly. 10/07/2020 upon evaluation today patient appears to be doing better in regard to her wounds of the lower extremities. Fortunately there does not appear to be any signs of active infection at this time which is great news. I do not see any evidence  of anything worsening but again she continues to have significant bilateral stage III lymphedema. This has been an ongoing issue. We actually have seen her this year pretty much from March through May of 2022. Following this she continued with compression at home following but then unfortunately reopened again. That was on July 11. With that being said this is an ongoing and recurrent issue for which honestly have seen her over the past 2 years pretty much consistently. Nonetheless we have had Velcro compression wraps that she uses at home and despite this she continues to have frequent and recurrent ulcerations. 7/27; patient presents for 1 week follow-up. She has no issues or complaints today. She denies signs of infection. 10/21/2020 upon evaluation today patient's leg actually appears to be doing decently well on the left there is nothing open on the right at all and I am pleased in that regard. On the left side I do think that she still has some areas of weeping I believe the compression wrap is still the best way to go although I am hopeful that we are headed in the right direction as far as getting this area to heal and silver. Upon inspection patient's wound bed actually showed signs of good granulation epithelization at this point. There does  not appear to be any evidence of active infection which is great news and overall I am extremely pleased with where things stand. 11/04/2020 upon evaluation today patient appears to be doing well with regard to her left leg. In fact this is getting very close to complete granulation and epithelization at this point I am very pleased with where we stand. I do not see any signs of infection currently. 11/18/2020 upon evaluation today patient appears to be doing well in regard to her wound. She is in fact doing extremely well considering the fact we did need to see her last week she was apparently sick and missed her appointment. She is therefore had the wrap  on for 2 weeks. Fortunately there is no signs of active infection at this time. No fevers, chills, nausea, vomiting, or diarrhea. 11/28/2020 upon evaluation today patient appears to be doing well with regard to her legs. Things are healing nicely and overall I do not see any signs of infection at this time which is great news. No fevers, chills, nausea, vomiting, or diarrhea. With that being said she still has a couple areas that are open and weeping currently. 12/10/2020 upon evaluation today patient appears to be doing well with regard to her wound. She has been tolerating the dressing changes without complication. Fortunately there does not appear to be any signs of active infection at this time. No fevers, chills, nausea, vomiting, or diarrhea. Electronic Signature(s) Signed: 12/10/2020 5:35:21 PM By: Worthy Keeler PA-C Entered By: Worthy Keeler on 12/10/2020 17:35:21 [name], [name] ([id number]) -------------------------------------------------------------------------------- Physical Exam Details Patient Name: [name] Date of Service: 12/10/2020 2:30 PM Medical Record Number: [id number] Patient Account Number: [id number] Date of Birth/Sex: 11/17/1965 (55 y.o. F) Treating RN: Donnamarie Poag Primary Care Provider: Laurance Flatten Other Clinician: Referring Provider: Laurance Flatten Treating Provider/Extender: Jeri Cos Weeks in Treatment: 27 Constitutional Well-nourished and well-hydrated in no acute distress. Respiratory normal breathing without difficulty. Psychiatric this patient is able to make decisions and demonstrates good insight into disease process. Alert and Oriented x 3. pleasant and cooperative. Notes Upon inspection patient's wounds again are not really even true open wounds as much is really weeping areas. I do believe that tighter compression could potentially help mitigate some of this. Also think lymphedema pumps could be very helpful and she is supposed to be  getting those at some point shortly she told me that she does need to contact them in order to get this paid for I guess as far as the co-pay portion that she owes is concerned. Electronic Signature(s) Signed: 12/10/2020 5:35:52 PM By: Worthy Keeler PA-C Entered By: Worthy Keeler on 12/10/2020 17:35:51 [name], [name] ([id number]) -------------------------------------------------------------------------------- Physician Orders Details Patient Name: [name] Date of Service: 12/10/2020 2:30 PM Medical Record Number: [id number] Patient Account Number: [id number] Date of Birth/Sex: Jun 04, 1965 (55 y.o. F) Treating RN: Carlene Coria Primary Care Provider: Laurance Flatten Other Clinician: Referring Provider: Laurance Flatten Treating Provider/Extender: Skipper Cliche in Treatment: 10 Verbal / Phone Orders: No Diagnosis Coding ICD-10 Coding Code Description I87.2 Venous insufficiency (chronic) (peripheral) I89.0 Lymphedema, not elsewhere classified L97.822 Non-pressure chronic ulcer of other part of left lower leg with fat layer exposed E11.622 Type 2 diabetes mellitus with other skin ulcer N18.30 Chronic kidney disease, stage 3 unspecified I10 Essential (primary) hypertension I50.42 Chronic combined systolic (congestive) and diastolic (congestive) heart failure Follow-up Appointments o Return Appointment in 1 week. Bathing/ Shower/ Hygiene o May shower with wound dressing protected with water repellent  cover or cast protector. Edema Control - Lymphedema / Segmental Compressive Device / Other o Optional: One layer of unna paste to top of compression wrap (to act as an anchor). o Patient to wear own Velcro compression garment. Remove compression stockings every night before going to bed and put on every morning when getting up. - right lower leg o Elevate, Exercise Daily and Avoid Standing for Long Periods of Time. o Elevate legs to the level of the heart and pump ankles as  often as possible o Elevate leg(s) parallel to the floor when sitting. o Compression Pump: Use compression pump on left lower extremity for 60 minutes, twice daily. o Compression Pump: Use compression pump on right lower extremity for 60 minutes, twice daily. Wound Treatment Wound #7 - Lower Leg Wound Laterality: Left, Anterior Cleanser: Soap and Water 1 x Per Week/30 Days Discharge Instructions: Gently cleanse wound with antibacterial soap, rinse and pat dry prior to dressing wounds Primary Dressing: Silvercel 4 1/4x 4 1/4 (in/in) 1 x Per Week/30 Days Discharge Instructions: Apply Silvercel 4 1/4x 4 1/4 (in/in) as instructed Secondary Dressing: ABD Pad 5x9 (in/in) 1 x Per Week/30 Days Discharge Instructions: Cover with ABD pad Compression Wrap: Profore LF 4 Multi-Layer Compression Bandaging System 1 x Per Week/30 Days Discharge Instructions: Apply 4 multi-layer wrap as directed. Wound #8 - Lower Leg Wound Laterality: Left, Posterior Cleanser: Soap and Water 1 x Per Week/30 Days Discharge Instructions: Gently cleanse wound with antibacterial soap, rinse and pat dry prior to dressing wounds Primary Dressing: Silvercel 4 1/4x 4 1/4 (in/in) 1 x Per Week/30 Days Discharge Instructions: Apply Silvercel 4 1/4x 4 1/4 (in/in) as instructed Secondary Dressing: ABD Pad 5x9 (in/in) 1 x Per Week/30 Days Discharge Instructions: Cover with ABD pad Compression Wrap: Medichoice 4 layer Compression System, 35-40 mmHG 1 x Per Week/30 Days Discharge Instructions: Apply multi-layer wrap as directed. [name], [name] ([id number]) Compression Stockings: Circaid Juxta Lite Compression Wrap Left Leg Compression Amount: 30-40 mmHG Discharge Instructions: Apply Circaid Juxta Lite Compression Wrap as directed Electronic Signature(s) Signed: 12/10/2020 3:47:10 PM By: Carlene Coria RN Signed: 12/10/2020 6:22:10 PM By: Worthy Keeler PA-C Entered By: Carlene Coria on 12/10/2020 15:47:09 [name], [name]  ([id number]) -------------------------------------------------------------------------------- Problem List Details Patient Name: [name] Date of Service: 12/10/2020 2:30 PM Medical Record Number: [id number] Patient Account Number: [id number] Date of Birth/Sex: 08/23/1965 (55 y.o. F) Treating RN: Donnamarie Poag Primary Care Provider: Laurance Flatten Other Clinician: Referring Provider: Laurance Flatten Treating Provider/Extender: Jeri Cos Weeks in Treatment: 10 Active Problems ICD-10 Encounter Code Description Active Date MDM Diagnosis I87.2 Venous insufficiency (chronic) (peripheral) 09/30/2020 No Yes I89.0 Lymphedema, not elsewhere classified 09/30/2020 No Yes L97.822 Non-pressure chronic ulcer of other part of left lower leg with fat layer 09/30/2020 No Yes exposed E11.622 Type 2 diabetes mellitus with other skin ulcer 09/30/2020 No Yes N18.30 Chronic kidney disease, stage 3 unspecified 09/30/2020 No Yes I10 Essential (primary) hypertension 09/30/2020 No Yes I50.42 Chronic combined systolic (congestive) and diastolic (congestive) heart 09/30/2020 No Yes failure Inactive Problems Resolved Problems Electronic Signature(s) Signed: 12/10/2020 2:38:55 PM By: Worthy Keeler PA-C Entered By: Worthy Keeler on 12/10/2020 14:38:55 [name], [name] ([id number]) -------------------------------------------------------------------------------- Progress Note Details Patient Name: [name] Date of Service: 12/10/2020 2:30 PM Medical Record Number: [id number] Patient Account Number: [id number] Date of Birth/Sex: 1965/04/08 (55 y.o. F) Treating RN: Donnamarie Poag Primary Care Provider: Laurance Flatten Other Clinician: Referring Provider: Laurance Flatten Treating Provider/Extender: Skipper Cliche in Treatment: 10 Subjective Chief Complaint Information obtained  from Patient Left LE Ulcer History of Present Illness (HPI) 10/24/2018 on evaluation today patient presents for initial evaluation or clinic  concerning issues that she has been having with lymphedema for quite some time. Unfortunately she has several wound openings at this point that secondary to her lymphedema/venous stasis are giving her trouble and leaking quite severely. She also has diabetes along with hypertension and stage III chronic kidney disease. Fortunately there is no signs of active infection at this time. She is going to likely require debridement of the left leg ulcer upon evaluation today just based on what I am seeing. Fortunately there is no wound opening on the right. Overall the patient seems to be doing quite well and again there is no evidence of systemic infection which is good news. No fevers, chills, nausea, vomiting, or diarrhea. 10/31/2018 on evaluation today patient actually appears to be doing excellent in regard to her lower extremity ulcers. She has been tolerating the dressing changes without complication. Fortunately there is no signs of active infection at this time. She has tolerated 3 layer compression wrap without complication. 11/07/2018 upon evaluation today patient actually appears to be doing very well with regard to her left lower extremity ulcers. She has been tolerating the dressing changes without complication. Fortunately there is no signs of active infection. No fevers, chills, nausea, vomiting, or diarrhea. 11/21/2018 upon evaluation today patient appears to be doing quite well with regard to her lower extremity ulcers. In fact both areas seem to be showing signs of good improvement which is excellent. She is not having as much pain as she has in the past and again has a lot of healing compared to previous visits as well. 12/05/2018 on evaluation today patient presents for follow-up concerning her ongoing issues with her bilateral lower extremity ulcers. The good news is her right lower extremity is showing signs of completely healing at this time which is great news. Fortunately there is no  evidence of active infection. On the left she has just a very small area still remaining that is open at this time all in all she is very close to complete closure in my opinion. She does have compression stockings to wear at home. 12/12/2018 on evaluation today patient appears to be doing excellent in regard to her left lateral lower extremity ulcer. She has been tolerating the dressing changes without complication. Fortunately there is no signs of active infection at this time. In fact this appears to be pretty much healed at this point although again she is not 100% today. No fevers, chills, nausea, vomiting, or diarrhea. 12/19/2018 on evaluation today patient actually appears to be doing excellent with regard to her lower extremity ulcer in fact this appears to be completely healed today which is all some. She has done extremely well with wound care measures. No fevers, chills, nausea, vomiting, or diarrhea. ------------------------------------ 11/01/19-Readmission to the clinic Patient presents with left leg pain, wounds posterior calf, onset about 4 weeks, denies any fevers chills or shakes, has not been using anything to these wounds, was given compression stockings at last discharge from the clinic but has not been able to use them as they rolled down and cause creases Patient's history significant for type 2 diabetes insulin requiring, A1c of 6.9 lately, hypertension, chronic pain 8/18; patient readmitted that the clinic last week. She has wounds on her left lateral posterior lower leg all of this in close juxtaposition i.e. a localized site. We've been using silver alginate under 3  layer compression apparently the wound surface area is better. She was wearing compression stockings from elastic therapy but says they were falling down. As she progresses more towards healing will need to address what we use in terms of compression stockings perhaps external compression garments 11/17/2019 on  evaluation today patient appears to be doing well with regard to her legs currently. She does have 2 wounds which are actually measuring much better the more posterior is actually doing very well which I am pleased with the other though smaller is not quite a small but nonetheless on the lateral aspect does seem to be improving. 11/23/2019 on evaluation today patient appears to be doing well in regard to her wounds. In fact the wound on the posterior aspect of her leg appears healed the lateral aspect is still open but extremely small. 9/15; this is a patient with chronic venous insufficiency and secondary lymphedema. She did not keep her clinic appointment last week and hence the left leg is a lot more swollen since she had to take the wrap off at some point. She has a small weeping area on the left lateral lower leg. She has probably a juxta lite stocking for the left leg I have asked her to bring that in when she comes to her clinic appointment next week at which time she should be healed 10/6; this is a patient with chronic venous insufficiency and secondary lymphedema left greater than right. Skin changes of chronic lymphedema especially in the left leg. She has not been here in 3 weeks. She took the wrap off 2 weeks ago. She has some very old 20/30 stockings from elastic therapy in [place] she has been using. Fortunately her leg is closed. She also has a single [place] wrap for the left leg Readmission: 06/18/2020 upon evaluation today patient presents for reevaluation here in the clinic. She is a long-term patient that we have seen intermittently [name], [name] ([id number]) when she has had flareups of issues with her lower extremity secondary to chronic venous stasis. With that being said unfortunately today she is having a significant issue with a wound which is actually quite significant over the right lower extremity. She did see dermatology they put Vaseline followed by [place]  dressings on her unfortunately she is continuing to have significant issues however here with pain and in fact this wrap was extremely stuck to her upon evaluation today. Nonetheless I believe that she is going to require a little bit different approach to try to keep things from sticking and hopefully aid in getting this to heal more effectively and quickly. With that being said she does have a history of chronic venous insufficiency, lymphedema, diabetes mellitus type 2, chronic kidney disease stage III, hypertension, and congestive heart failure. This started as an injury in mid February and has worsened over the past 2 weeks. 07/04/2020 upon evaluation today patient appears to be doing well with regard to her wound on the leg. Overall I am extremely pleased with where things stand and I do think that she is making excellent progress. There is no sign of infection right now which is also great news. I think that she is close to complete resolution. 07/09/20 upon evaluation today patient appears to be doing excellent in regard to her leg ulcer. She has been tolerating the dressing changes without complication and in general I am extremely pleased with where things stand today. There does not appear to be any signs of active infection at this time  which is great news. 07/25/2020 upon evaluation today patient appears to be doing well with regard to her wound. She has been tolerating the dressing changes without complication. Fortunately there is no signs of active infection at this time and her wound appears to be completely healed. Readmission: 09/30/2020 upon evaluation today patient appears for reevaluation here in our clinic. She was discharged May 5 that unfortunately has started to have weeping and wounds on the left leg at this point. She tells me the right leg did weep some but nothing as significant as the left. With all that being said at this point the patient states that she figured it was  best to come in here and she was having a lot of drainage and it was get all of her sheets and everything. She has been wearing her Velcro wraps sometimes but it does not sound like she is been wearing them all the time which I think is part of the reason why this reopened. She also unfortunately had a fall which was quite significant. This occurred I believe about a week or 2 ago. Otherwise patient's past medical history really has not changed significantly. 10/07/2020 upon evaluation today patient appears to be doing better in regard to her wounds of the lower extremities. Fortunately there does not appear to be any signs of active infection at this time which is great news. I do not see any evidence of anything worsening but again she continues to have significant bilateral stage III lymphedema. This has been an ongoing issue. We actually have seen her this year pretty much from March through May of 2022. Following this she continued with compression at home following but then unfortunately reopened again. That was on July 11. With that being said this is an ongoing and recurrent issue for which honestly have seen her over the past 2 years pretty much consistently. Nonetheless we have had Velcro compression wraps that she uses at home and despite this she continues to have frequent and recurrent ulcerations. 7/27; patient presents for 1 week follow-up. She has no issues or complaints today. She denies signs of infection. 10/21/2020 upon evaluation today patient's leg actually appears to be doing decently well on the left there is nothing open on the right at all and I am pleased in that regard. On the left side I do think that she still has some areas of weeping I believe the compression wrap is still the best way to go although I am hopeful that we are headed in the right direction as far as getting this area to heal and silver. Upon inspection patient's wound bed actually showed signs of good  granulation epithelization at this point. There does not appear to be any evidence of active infection which is great news and overall I am extremely pleased with where things stand. 11/04/2020 upon evaluation today patient appears to be doing well with regard to her left leg. In fact this is getting very close to complete granulation and epithelization at this point I am very pleased with where we stand. I do not see any signs of infection currently. 11/18/2020 upon evaluation today patient appears to be doing well in regard to her wound. She is in fact doing extremely well considering the fact we did need to see her last week she was apparently sick and missed her appointment. She is therefore had the wrap on for 2 weeks. Fortunately there is no signs of active infection at this time. No fevers, chills,  nausea, vomiting, or diarrhea. 11/28/2020 upon evaluation today patient appears to be doing well with regard to her legs. Things are healing nicely and overall I do not see any signs of infection at this time which is great news. No fevers, chills, nausea, vomiting, or diarrhea. With that being said she still has a couple areas that are open and weeping currently. 12/10/2020 upon evaluation today patient appears to be doing well with regard to her wound. She has been tolerating the dressing changes without complication. Fortunately there does not appear to be any signs of active infection at this time. No fevers, chills, nausea, vomiting, or diarrhea. Objective Constitutional Well-nourished and well-hydrated in no acute distress. Vitals Time Taken: 2:45 PM, Temperature: 99.4 F, Pulse: 97 bpm, Respiratory Rate: 18 breaths/min, Blood Pressure: 137/72 mmHg. Respiratory normal breathing without difficulty. [name], [name] ([id number]) Psychiatric this patient is able to make decisions and demonstrates good insight into disease process. Alert and Oriented x 3. pleasant and cooperative. General Notes:  Upon inspection patient's wounds again are not really even true open wounds as much is really weeping areas. I do believe that tighter compression could potentially help mitigate some of this. Also think lymphedema pumps could be very helpful and she is supposed to be getting those at some point shortly she told me that she does need to contact them in order to get this paid for I guess as far as the co-pay portion that she owes is concerned. Integumentary (Hair, Skin) Wound #7 status is Open. Original cause of wound was Gradually Appeared. The date acquired was: 09/20/2020. The wound has been in treatment 10 weeks. The wound is located on the Left,Anterior Lower Leg. The wound measures 4cm length x 1.5cm width x 0.1cm depth; 4.712cm^2 area and 0.471cm^3 volume. There is Fat Layer (Subcutaneous Tissue) exposed. There is no tunneling or undermining noted. There is a medium amount of serosanguineous drainage noted. There is medium (34-66%) pink granulation within the wound bed. There is a medium (34-66%) amount of necrotic tissue within the wound bed including Adherent Slough. Wound #8 status is Open. Original cause of wound was Gradually Appeared. The date acquired was: 09/20/2020. The wound has been in treatment 10 weeks. The wound is located on the Left,Posterior Lower Leg. The wound measures 3.5cm length x 2.3cm width x 0.1cm depth; 6.322cm^2 area and 0.632cm^3 volume. There is Fat Layer (Subcutaneous Tissue) exposed. There is no tunneling or undermining noted. There is a medium amount of serosanguineous drainage noted. There is medium (34-66%) pink, pale granulation within the wound bed. There is a medium (34- 66%) amount of necrotic tissue within the wound bed including Adherent Slough. Assessment Active Problems ICD-10 Venous insufficiency (chronic) (peripheral) Lymphedema, not elsewhere classified Non-pressure chronic ulcer of other part of left lower leg with fat layer exposed Type 2 diabetes  mellitus with other skin ulcer Chronic kidney disease, stage 3 unspecified Essential (primary) hypertension Chronic combined systolic (congestive) and diastolic (congestive) heart failure Procedures Wound #7 Pre-procedure diagnosis of Wound #7 is a Diabetic Wound/Ulcer of the Lower Extremity located on the Left,Anterior Lower Leg . There was a Four Layer Compression Therapy Procedure by Carlene Coria, RN. Post procedure Diagnosis Wound #7: Same as Pre-Procedure Wound #8 Pre-procedure diagnosis of Wound #8 is a Diabetic Wound/Ulcer of the Lower Extremity located on the Left,Posterior Lower Leg . There was a Four Layer Compression Therapy Procedure by Carlene Coria, RN. Post procedure Diagnosis Wound #8: Same as Pre-Procedure Plan Follow-up Appointments: Return Appointment in 1  week. Bathing/ Shower/ Hygiene: May shower with wound dressing protected with water repellent cover or cast protector. Edema Control - Lymphedema / Segmental Compressive Device / Other: Optional: One layer of unna paste to top of compression wrap (to act as an anchor). Patient to wear own Velcro compression garment. Remove compression stockings every night before going to bed and put on every morning when getting up. - right lower leg Elevate, Exercise Daily and Avoid Standing for Long Periods of Time. Elevate legs to the level of the heart and pump ankles as often as possible Elevate leg(s) parallel to the floor when sitting. Compression Pump: Use compression pump on left lower extremity for 60 minutes, twice daily. [name], [name] ([id number]) Compression Pump: Use compression pump on right lower extremity for 60 minutes, twice daily. WOUND #7: - Lower Leg Wound Laterality: Left, Anterior Cleanser: Soap and Water 1 x Per Week/30 Days Discharge Instructions: Gently cleanse wound with antibacterial soap, rinse and pat dry prior to dressing wounds Primary Dressing: Silvercel 4 1/4x 4 1/4 (in/in) 1 x Per Week/30  Days Discharge Instructions: Apply Silvercel 4 1/4x 4 1/4 (in/in) as instructed Secondary Dressing: ABD Pad 5x9 (in/in) 1 x Per Week/30 Days Discharge Instructions: Cover with ABD pad Compression Wrap: Profore LF 4 Multi-Layer Compression Bandaging System 1 x Per Week/30 Days Discharge Instructions: Apply 4 multi-layer wrap as directed. WOUND #8: - Lower Leg Wound Laterality: Left, Posterior Cleanser: Soap and Water 1 x Per Week/30 Days Discharge Instructions: Gently cleanse wound with antibacterial soap, rinse and pat dry prior to dressing wounds Primary Dressing: Silvercel 4 1/4x 4 1/4 (in/in) 1 x Per Week/30 Days Discharge Instructions: Apply Silvercel 4 1/4x 4 1/4 (in/in) as instructed Secondary Dressing: ABD Pad 5x9 (in/in) 1 x Per Week/30 Days Discharge Instructions: Cover with ABD pad Compression Wrap: Medichoice 4 layer Compression System, 35-40 mmHG 1 x Per Week/30 Days Discharge Instructions: Apply multi-layer wrap as directed. Compression Stockings: Circaid Juxta Lite Compression Wrap Compression Amount: 30-40 mmHg (left) Discharge Instructions: Apply Circaid Juxta Lite Compression Wrap as directed 1. Would recommend currently that we go ahead and continue with the juxta lite compression for the right leg I feel like that is doing a great job. 2. I am also going to recommend that we have the patient continue to elevate her legs much as possible to help with edema control. 3. I would also suggest that we have the patient up to a 4-layer compression wrap for the left leg. I think this will be better controlling the edema. 4. I would also suggest that we continue with the silver alginate I think that still the best option currently we may make some changes in this regard if she continues to have significant issues. We will see patient back for reevaluation in 1 week here in the clinic. If anything worsens or changes patient will contact our office for  additional recommendations. Electronic Signature(s) Signed: 12/10/2020 5:37:01 PM By: Worthy Keeler PA-C Entered By: Worthy Keeler on 12/10/2020 17:37:00 [name], [name] ([id number]) -------------------------------------------------------------------------------- SuperBill Details Patient Name: [name] Date of Service: 12/10/2020 Medical Record Number: [id number] Patient Account Number: [id number] Date of Birth/Sex: [birth year] (55 y.o. F) Treating RN: Carlene Coria Primary Care Provider: Laurance Flatten Other Clinician: Referring Provider: Laurance Flatten Treating Provider/Extender: Skipper Cliche in Treatment: 10 Diagnosis Coding ICD-10 Codes Code Description I87.2 Venous insufficiency (chronic) (peripheral) I89.0 Lymphedema, not elsewhere classified L97.822 Non-pressure chronic ulcer of other part of left lower leg with fat layer exposed E11.622  Type 2 diabetes mellitus with other skin ulcer N18.30 Chronic kidney disease, stage 3 unspecified I10 Essential (primary) hypertension I50.42 Chronic combined systolic (congestive) and diastolic (congestive) heart failure Facility Procedures CPT4 Code: 14388875 Description: (Facility Use Only) [id number] - [place] Modifier: Quantity: 1 Physician Procedures CPT4 Code: 6015615 Description: 37943 - WC PHYS LEVEL 3 - EST PT Modifier: Quantity: 1 CPT4 Code: Description: ICD-10 Diagnosis Description I87.2 Venous insufficiency (chronic) (peripheral) I89.0 Lymphedema, not elsewhere classified L97.822 Non-pressure chronic ulcer of other part of left lower leg with fat lay E11.622 Type 2 diabetes mellitus with  other skin ulcer Modifier: er exposed Quantity: Electronic Signature(s) Signed: 12/10/2020 5:37:22 PM By: Worthy Keeler PA-C Previous Signature: 12/10/2020 3:47:50 PM Version By: Carlene Coria RN Entered By: Worthy Keeler on 12/10/2020 17:37:21

## 2020-12-16 ENCOUNTER — Telehealth (HOSPITAL_COMMUNITY): Payer: Medicare Other | Admitting: Cardiology

## 2020-12-16 ENCOUNTER — Encounter (HOSPITAL_COMMUNITY): Payer: Medicare Other | Admitting: Cardiology

## 2020-12-16 ENCOUNTER — Ambulatory Visit: Payer: Medicare Other | Admitting: Internal Medicine

## 2020-12-16 ENCOUNTER — Other Ambulatory Visit (HOSPITAL_COMMUNITY): Payer: Self-pay | Admitting: Cardiology

## 2020-12-17 ENCOUNTER — Other Ambulatory Visit: Payer: Self-pay

## 2020-12-17 DIAGNOSIS — I872 Venous insufficiency (chronic) (peripheral): Secondary | ICD-10-CM | POA: Diagnosis not present

## 2020-12-17 NOTE — Progress Notes (Signed)
[name], [name] ([id number]) Visit Report for 12/17/2020 Arrival Information Details Patient Name: [name], [name] Date of Service: 12/17/2020 10:00 AM Medical Record Number: [id number] Patient Account Number: [id number] Date of Birth/Sex: October 13, 1965 (55 y.o. F) Treating RN: [name] Primary Care [name]: [name] Other Clinician: Referring [name]: [name] Treating [name]/Extender: [name] in Treatment: 11 Visit Information History Since Last Visit All ordered tests and consults were completed: No Patient Arrived: Ambulatory Added or deleted any medications: No Arrival Time: 10:31 Any new allergies or adverse reactions: No Accompanied By: self Had a fall or experienced change in No Transfer Assistance: None activities of daily living that may affect Patient Identification Verified: Yes risk of falls: Secondary Verification Process Completed: Yes Signs or symptoms of abuse/neglect since last visito No Patient Requires Transmission-Based Precautions: No Hospitalized since last visit: No Patient Has Alerts: No Implantable device outside of the clinic excluding No cellular tissue based products placed in the center since last visit: Has Dressing in Place as Prescribed: Yes Has Compression in Place as Prescribed: Yes Pain Present Now: No Electronic Signature(s) Signed: 12/17/2020 11:11:36 AM By: [name] Coria RN Entered By: [name] on 12/17/2020 10:32:00 [name], [name] ([id number]) -------------------------------------------------------------------------------- Clinic Level of Care Assessment Details Patient Name: [name], [name] Date of Service: 12/17/2020 10:00 AM Medical Record Number: [id number] Patient Account Number: [id number] Date of Birth/Sex: 10/24/1965 (55 y.o. F) Treating RN: [name] Primary Care [name]: [name] Other Clinician: Referring [name]: [name] Treating [name]/Extender: [name] in Treatment: 11 Clinic  Level of Care Assessment Items TOOL 4 Quantity Score []  - Use when only an EandM is performed on FOLLOW-UP visit 0 ASSESSMENTS - Nursing Assessment / Reassessment []  - Reassessment of Co-morbidities (includes updates in patient status) 0 []  - 0 Reassessment of Adherence to Treatment Plan ASSESSMENTS - Wound and Skin Assessment / Reassessment []  - Simple Wound Assessment / Reassessment - one wound 0 []  - 0 Complex Wound Assessment / Reassessment - multiple wounds []  - 0 Dermatologic / Skin Assessment (not related to wound area) ASSESSMENTS - Focused Assessment []  - Circumferential Edema Measurements - multi extremities 0 []  - 0 Nutritional Assessment / Counseling / Intervention []  - 0 Lower Extremity Assessment (monofilament, tuning fork, pulses) []  - 0 Peripheral Arterial Disease Assessment (using hand held doppler) ASSESSMENTS - Ostomy and/or Continence Assessment and Care []  - Incontinence Assessment and Management 0 []  - 0 Ostomy Care Assessment and Management (repouching, etc.) PROCESS - Coordination of Care []  - Simple Patient / Family Education for ongoing care 0 []  - 0 Complex (extensive) Patient / Family Education for ongoing care []  - 0 Staff obtains Programmer, systems, Records, Test Results / Process Orders []  - 0 Staff telephones HHA, Nursing Homes / Clarify orders / etc []  - 0 Routine Transfer to another Facility (non-emergent condition) []  - 0 Routine Hospital Admission (non-emergent condition) []  - 0 New Admissions / Biomedical engineer / Ordering NPWT, Apligraf, etc. []  - 0 Emergency Hospital Admission (emergent condition) []  - 0 Simple Discharge Coordination []  - 0 Complex (extensive) Discharge Coordination PROCESS - Special Needs []  - Pediatric / Minor Patient Management 0 []  - 0 Isolation Patient Management []  - 0 Hearing / Language / Visual special needs []  - 0 Assessment of Community assistance (transportation, D/C planning, etc.) []  - 0 Additional  assistance / Altered mentation []  - 0 Support Surface(s) Assessment (bed, cushion, seat, etc.) INTERVENTIONS - Wound Cleansing / Measurement Nastasi, Rosey ([id number]) []  - 0 Simple Wound Cleansing - one wound []  -  0 Complex Wound Cleansing - multiple wounds []  - 0 Wound Imaging (photographs - any number of wounds) []  - 0 Wound Tracing (instead of photographs) []  - 0 Simple Wound Measurement - one wound []  - 0 Complex Wound Measurement - multiple wounds INTERVENTIONS - Wound Dressings []  - Small Wound Dressing one or multiple wounds 0 []  - 0 Medium Wound Dressing one or multiple wounds []  - 0 Large Wound Dressing one or multiple wounds []  - 0 Application of Medications - topical []  - 0 Application of Medications - injection INTERVENTIONS - Miscellaneous []  - External ear exam 0 []  - 0 Specimen Collection (cultures, biopsies, blood, body fluids, etc.) []  - 0 Specimen(s) / Culture(s) sent or taken to Lab for analysis []  - 0 Patient Transfer (multiple staff / Civil Service fast streamer / Similar devices) []  - 0 Simple Staple / Suture removal (25 or less) []  - 0 Complex Staple / Suture removal (26 or more) []  - 0 Hypo / Hyperglycemic Management (close monitor of Blood Glucose) []  - 0 Ankle / Brachial Index (ABI) - do not check if billed separately []  - 0 Vital Signs Has the patient been seen at the hospital within the last three years: Yes Total Score: 0 Level Of Care: ____ Electronic Signature(s) Signed: 12/17/2020 11:11:36 AM By: [name] Coria RN Entered By: [name] on 12/17/2020 10:35:40 [name], [name] ([id number]) -------------------------------------------------------------------------------- Compression Therapy Details Patient Name: [name] Date of Service: 12/17/2020 10:00 AM Medical Record Number: [id number] Patient Account Number: [id number] Date of Birth/Sex: 1965/10/05 (55 y.o. F) Treating RN: [name] Primary Care [name]: [name] Other  Clinician: Referring [name]: [name] Treating [name] Landini/Extender: [name] in Treatment: 11 Compression Therapy Performed for Wound Assessment: Wound #7 Left,Anterior Lower Leg Performed By: Clinician [name] Coria, RN Compression Type: Three Layer Electronic Signature(s) Signed: 12/17/2020 11:11:36 AM By: [name] Coria RN Entered By: [name] on 12/17/2020 10:33:36 [name], [name] ([id number]) -------------------------------------------------------------------------------- Compression Therapy Details Patient Name: [name] Date of Service: 12/17/2020 10:00 AM Medical Record Number: [id number] Patient Account Number: [id number] Date of Birth/Sex: 07-01-1965 (55 y.o. F) Treating RN: [name] Primary Care Lalia Loudon: [name] Other Clinician: Referring Autry Prust: [name] Treating Jahaan Vanwagner/Extender: [name] in Treatment: 11 Compression Therapy Performed for Wound Assessment: Wound #8 Left,Posterior Lower Leg Performed By: Clinician [name] Coria, RN Compression Type: Three Layer Electronic Signature(s) Signed: 12/17/2020 11:11:36 AM By: [name] Coria RN Entered By: [name] on 12/17/2020 10:34:18 [name], [name] ([id number]) -------------------------------------------------------------------------------- Encounter Discharge Information Details Patient Name: [name] Date of Service: 12/17/2020 10:00 AM Medical Record Number: [id number] Patient Account Number: [id number] Date of Birth/Sex: 28-Aug-1965 (55 y.o. F) Treating RN: [name] Primary Care Keison Glendinning: [name] Other Clinician: Referring Deana Krock: [name] Treating Korbin Mapps/Extender: [name] in Treatment: 11 Encounter Discharge Information Items Discharge Condition: Stable Ambulatory Status: Ambulatory Discharge Destination: Home Transportation: Private Auto Accompanied By: self Schedule Follow-up Appointment: Yes Clinical Summary of Care: Patient  Declined Electronic Signature(s) Signed: 12/17/2020 11:11:36 AM By: [name] Coria RN Entered By: [name] on 12/17/2020 10:35:03 [name], [name] ([id number]) -------------------------------------------------------------------------------- Wound Assessment Details Patient Name: [name] Date of Service: 12/17/2020 10:00 AM Medical Record Number: [id number] Patient Account Number: [id number] Date of Birth/Sex: 26-Mar-1965 (55 y.o. F) Treating RN: [name] Primary Care Atziri Zubiate: [name] Other Clinician: Referring Daniyla Pfahler: [name] Treating Janautica Netzley/Extender: Jeri Cos Weeks in Treatment: 11 Wound Status Wound Number: 7 Primary Diabetic Wound/Ulcer of the Lower Extremity Etiology: Wound Location: Left, Anterior Lower Leg Wound Open Wounding Event:  Gradually Appeared Status: Date Acquired: 09/20/2020 Comorbid Congestive Heart Failure, Coronary Artery Disease, Weeks Of Treatment: 11 History: Hypertension, Myocardial Infarction, Peripheral Venous Clustered Wound: No Disease, Type II Diabetes, End Stage Renal Disease, Neuropathy Wound Measurements Length: (cm) 4 Width: (cm) 1.5 Depth: (cm) 0.1 Area: (cm) 4.712 Volume: (cm) 0.471 % Reduction in Area: 86.4% % Reduction in Volume: 86.4% Epithelialization: Large (67-100%) Tunneling: No Undermining: No Wound Description Classification: Grade 2 Exudate Amount: Medium Exudate Type: Serosanguineous Exudate Color: red, brown Foul Odor After Cleansing: No Wound Bed Granulation Amount: Medium (34-66%) Exposed Structure Granulation Quality: Pink Fascia Exposed: No Necrotic Amount: Medium (34-66%) Fat Layer (Subcutaneous Tissue) Exposed: Yes Necrotic Quality: Adherent Slough Tendon Exposed: No Muscle Exposed: No Joint Exposed: No Bone Exposed: No Treatment Notes Wound #7 (Lower Leg) Wound Laterality: Left, Anterior Cleanser Soap and Water Discharge Instruction: Gently cleanse wound with antibacterial soap,  rinse and pat dry prior to dressing wounds Peri-Wound Care Topical Primary Dressing Silvercel 4 1/4x 4 1/4 (in/in) Discharge Instruction: Apply Silvercel 4 1/4x 4 1/4 (in/in) as instructed Secondary Dressing ABD Pad 5x9 (in/in) Discharge Instruction: Cover with ABD pad Secured With Compression Wrap Profore LF 4 Multi-Layer Compression Accident [name], [name] ([id number]) Discharge Instruction: Apply 4 multi-layer wrap as directed. Compression Stockings Add-Ons Electronic Signature(s) Signed: 12/17/2020 11:11:36 AM By: [name] Coria RN Entered By: [name] on 12/17/2020 10:32:49 [name], [name] ([id number]) -------------------------------------------------------------------------------- Wound Assessment Details Patient Name: [name] Date of Service: 12/17/2020 10:00 AM Medical Record Number: [id number] Patient Account Number: [id number] Date of Birth/Sex: 04-25-1965 (55 y.o. F) Treating RN: [name] Primary Care Laydon Martis: [name] Other Clinician: Referring Corrisa Gibby: [name] Treating Sukanya Goldblatt/Extender: Jeri Cos Weeks in Treatment: 11 Wound Status Wound Number: 8 Primary Diabetic Wound/Ulcer of the Lower Extremity Etiology: Wound Location: Left, Posterior Lower Leg Wound Open Wounding Event: Gradually Appeared Status: Date Acquired: 09/20/2020 Comorbid Congestive Heart Failure, Coronary Artery Disease, Weeks Of Treatment: 11 History: Hypertension, Myocardial Infarction, Peripheral Venous Clustered Wound: No Disease, Type II Diabetes, End Stage Renal Disease, Neuropathy Wound Measurements Length: (cm) 3.5 Width: (cm) 2.3 Depth: (cm) 0.1 Area: (cm) 6.322 Volume: (cm) 0.632 % Reduction in Area: -114.7% % Reduction in Volume: -114.2% Epithelialization: None Tunneling: No Undermining: No Wound Description Classification: Grade 2 Exudate Amount: Medium Exudate Type: Serosanguineous Exudate Color: red, brown Foul Odor After Cleansing:  No Slough/Fibrino Yes Wound Bed Granulation Amount: Medium (34-66%) Exposed Structure Granulation Quality: Pink, Pale Fascia Exposed: No Necrotic Amount: Medium (34-66%) Fat Layer (Subcutaneous Tissue) Exposed: Yes Necrotic Quality: Adherent Slough Tendon Exposed: No Muscle Exposed: No Joint Exposed: No Bone Exposed: No Treatment Notes Wound #8 (Lower Leg) Wound Laterality: Left, Posterior Cleanser Soap and Water Discharge Instruction: Gently cleanse wound with antibacterial soap, rinse and pat dry prior to dressing wounds Peri-Wound Care Topical Primary Dressing Silvercel 4 1/4x 4 1/4 (in/in) Discharge Instruction: Apply Silvercel 4 1/4x 4 1/4 (in/in) as instructed Secondary Dressing ABD Pad 5x9 (in/in) Discharge Instruction: Cover with ABD pad Secured With Compression Wrap Medichoice 4 layer Compression System, 35-40 mmHG [name], [name] ([id number]) Discharge Instruction: Apply multi-layer wrap as directed. Compression Stockings Circaid Juxta Lite Compression Wrap Quantity: 1 Left Leg Compression Amount: 30-40 mmHg Discharge Instruction: Apply Circaid Juxta Lite Compression Wrap as directed Add-Ons Electronic Signature(s) Signed: 12/17/2020 11:11:36 AM By: [name] Coria RN Entered By: [name] on 12/17/2020 10:33:10

## 2020-12-19 ENCOUNTER — Telehealth (INDEPENDENT_AMBULATORY_CARE_PROVIDER_SITE_OTHER): Payer: Medicare Other | Admitting: Cardiology

## 2020-12-19 ENCOUNTER — Other Ambulatory Visit: Payer: Self-pay

## 2020-12-19 VITALS — Ht 67.0 in | Wt 325.0 lb

## 2020-12-19 DIAGNOSIS — G4733 Obstructive sleep apnea (adult) (pediatric): Secondary | ICD-10-CM

## 2020-12-19 DIAGNOSIS — I10 Essential (primary) hypertension: Secondary | ICD-10-CM

## 2020-12-19 NOTE — Addendum Note (Signed)
Addended by: Antonieta Iba on: 12/19/2020 11:01 AM   Modules accepted: Orders
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
## 2020-12-19 NOTE — Patient Instructions (Signed)
Medication Instructions:  Your physician recommends that you continue on your current medications as directed. Please refer to the Current Medication list given to you today.  *If you need a refill on your cardiac medications before your next appointment, please call your pharmacy*   Testing/Procedures: Dr. Radford Pax has ordered you a BiPAP titration. Our Sleep Team will be in contact with you to set this up.    Follow-Up: At [facility], you and your health needs are our priority.  As part of our continuing mission to provide you with exceptional heart care, we have created designated Provider Care Teams.  These Care Teams include your primary Cardiologist (physician) and Advanced Practice Providers (APPs -  Physician Assistants and Nurse Practitioners) who all work together to provide you with the care you need, when you need it.  Follow up with Dr. Radford Pax after titration.

## 2020-12-24 ENCOUNTER — Ambulatory Visit: Payer: Medicare Other | Admitting: Physician Assistant

## 2020-12-26 ENCOUNTER — Telehealth: Payer: Self-pay | Admitting: *Deleted

## 2020-12-26 NOTE — Telephone Encounter (Signed)
Prior Authorization for [employer] sent to [facility] via web portal.  Notification or Prior Authorization is not required for the requested services  Decision ID #:[id number]

## 2020-12-26 NOTE — Telephone Encounter (Signed)
-----   Message from Antonieta Iba, RN sent at 12/19/2020 11:01 AM EDT ----- BiPAP titration has been ordered.  Thanks!!

## 2020-12-27 ENCOUNTER — Ambulatory Visit: Payer: Medicare Other | Admitting: Physician Assistant

## 2020-12-31 ENCOUNTER — Other Ambulatory Visit: Payer: Self-pay

## 2020-12-31 ENCOUNTER — Encounter: Payer: Medicare Other | Attending: Internal Medicine | Admitting: Internal Medicine

## 2020-12-31 DIAGNOSIS — I130 Hypertensive heart and chronic kidney disease with heart failure and stage 1 through stage 4 chronic kidney disease, or unspecified chronic kidney disease: Secondary | ICD-10-CM | POA: Insufficient documentation

## 2020-12-31 DIAGNOSIS — Z794 Long term (current) use of insulin: Secondary | ICD-10-CM | POA: Insufficient documentation

## 2020-12-31 DIAGNOSIS — L97822 Non-pressure chronic ulcer of other part of left lower leg with fat layer exposed: Secondary | ICD-10-CM | POA: Insufficient documentation

## 2020-12-31 DIAGNOSIS — E1122 Type 2 diabetes mellitus with diabetic chronic kidney disease: Secondary | ICD-10-CM | POA: Insufficient documentation

## 2020-12-31 DIAGNOSIS — I872 Venous insufficiency (chronic) (peripheral): Secondary | ICD-10-CM | POA: Insufficient documentation

## 2020-12-31 DIAGNOSIS — I5042 Chronic combined systolic (congestive) and diastolic (congestive) heart failure: Secondary | ICD-10-CM | POA: Diagnosis not present

## 2020-12-31 DIAGNOSIS — I890 Lymphedema, not elsewhere classified: Secondary | ICD-10-CM | POA: Diagnosis not present

## 2020-12-31 DIAGNOSIS — E11622 Type 2 diabetes mellitus with other skin ulcer: Secondary | ICD-10-CM | POA: Diagnosis not present

## 2020-12-31 DIAGNOSIS — N183 Chronic kidney disease, stage 3 unspecified: Secondary | ICD-10-CM | POA: Diagnosis not present

## 2020-12-31 NOTE — Progress Notes (Signed)
[name], [name] ([id number]) Visit Report for 12/31/2020 HPI Details Patient Name: [name], [name] Date of Service: 12/31/2020 3:15 PM Medical Record Number: [id number] Patient Account Number: [id number] Date of Birth/Sex: 07/20/1965 (55 y.o. F) Treating RN: [name] Primary Care Provider: Laurance [name] Other Clinician: Referring Provider: Laurance [name] Treating Provider/Extender: [name] in Treatment: 13 History of Present Illness HPI Description: 10/24/2018 on evaluation today patient presents for initial evaluation or clinic concerning issues that she has been having with lymphedema for quite some time. Unfortunately she has several wound openings at this point that secondary to her lymphedema/venous stasis are giving her trouble and leaking quite severely. She also has diabetes along with hypertension and stage III chronic kidney disease. Fortunately there is no signs of active infection at this time. She is going to likely require debridement of the left leg ulcer upon evaluation today just based on what I am seeing. Fortunately there is no wound opening on the right. Overall the patient seems to be doing quite well and again there is no evidence of systemic infection which is good news. No fevers, chills, nausea, vomiting, or diarrhea. 10/31/2018 on evaluation today patient actually appears to be doing excellent in regard to her lower extremity ulcers. She has been tolerating the dressing changes without complication. Fortunately there is no signs of active infection at this time. She has tolerated 3 layer compression wrap without complication. 11/07/2018 upon evaluation today patient actually appears to be doing very well with regard to her left lower extremity ulcers. She has been tolerating the dressing changes without complication. Fortunately there is no signs of active infection. No fevers, chills, nausea, vomiting, or diarrhea. 11/21/2018 upon evaluation today patient  appears to be doing quite well with regard to her lower extremity ulcers. In fact both areas seem to be showing signs of good improvement which is excellent. She is not having as much pain as she has in the past and again has a lot of healing compared to previous visits as well. 12/05/2018 on evaluation today patient presents for follow-up concerning her ongoing issues with her bilateral lower extremity ulcers. The good news is her right lower extremity is showing signs of completely healing at this time which is great news. Fortunately there is no evidence of active infection. On the left she has just a very small area still remaining that is open at this time all in all she is very close to complete closure in my opinion. She does have compression stockings to wear at home. 12/12/2018 on evaluation today patient appears to be doing excellent in regard to her left lateral lower extremity ulcer. She has been tolerating the dressing changes without complication. Fortunately there is no signs of active infection at this time. In fact this appears to be pretty much healed at this point although again she is not 100% today. No fevers, chills, nausea, vomiting, or diarrhea. 12/19/2018 on evaluation today patient actually appears to be doing excellent with regard to her lower extremity ulcer in fact this appears to be completely healed today which is all some. She has done extremely well with wound care measures. No fevers, chills, nausea, vomiting, or diarrhea. ------------------------------------ 11/01/19-Readmission to the clinic Patient presents with left leg pain, wounds posterior calf, onset about 4 weeks, denies any fevers chills or shakes, has not been using anything to these wounds, was given compression stockings at last discharge from the clinic but has not been able to use them as they rolled down and  cause creases Patient's history significant for type 2 diabetes insulin requiring, A1c of 6.9  lately, hypertension, chronic pain 8/18; patient readmitted that the clinic last week. She has wounds on her left lateral posterior lower leg all of this in close juxtaposition i.e. a localized site. We've been using silver alginate under 3 layer compression apparently the wound surface area is better. She was wearing compression stockings from elastic therapy but says they were falling down. As she progresses more towards healing will need to address what we use in terms of compression stockings perhaps external compression garments 11/17/2019 on evaluation today patient appears to be doing well with regard to her legs currently. She does have 2 wounds which are actually measuring much better the more posterior is actually doing very well which I am pleased with the other though smaller is not quite a small but nonetheless on the lateral aspect does seem to be improving. 11/23/2019 on evaluation today patient appears to be doing well in regard to her wounds. In fact the wound on the posterior aspect of her leg appears healed the lateral aspect is still open but extremely small. 9/15; this is a patient with chronic venous insufficiency and secondary lymphedema. She did not keep her clinic appointment last week and hence the left leg is a lot more swollen since she had to take the wrap off at some point. She has a small weeping area on the left lateral lower leg. She has probably a juxta lite stocking for the left leg I have asked her to bring that in when she comes to her clinic appointment next week at which time she should be healed 10/6; this is a patient with chronic venous insufficiency and secondary lymphedema left greater than right. Skin changes of chronic lymphedema especially in the left leg. She has not been here in 3 weeks. She took the wrap off 2 weeks ago. She has some very old 20/30 stockings from elastic therapy in [place] she has been using. Fortunately her leg is closed. She also has  a single Farrow wrap for the left leg Readmission: 06/18/2020 upon evaluation today patient presents for reevaluation here in the clinic. She is a long-term patient that we have seen intermittently when she has had flareups of issues with her lower extremity secondary to chronic venous stasis. With that being said unfortunately today she is [name], [name] ([id number]) having a significant issue with a wound which is actually quite significant over the right lower extremity. She did see dermatology they put Vaseline followed by T[place]a island dressings on her unfortunately she is continuing to have significant issues however here with pain and in fact this wrap was extremely stuck to her upon evaluation today. Nonetheless I believe that she is going to require a little bit different approach to try to keep things from sticking and hopefully aid in getting this to heal more effectively and quickly. With that being said she does have a history of chronic venous insufficiency, lymphedema, diabetes mellitus type 2, chronic kidney disease stage III, hypertension, and congestive heart failure. This started as an injury in mid February and has worsened over the past 2 weeks. 07/04/2020 upon evaluation today patient appears to be doing well with regard to her wound on the leg. Overall I am extremely pleased with where things stand and I do think that she is making excellent progress. There is no sign of infection right now which is also great news. I think that she is close to  complete resolution. 07/09/20 upon evaluation today patient appears to be doing excellent in regard to her leg ulcer. She has been tolerating the dressing changes without complication and in general I am extremely pleased with where things stand today. There does not appear to be any signs of active infection at this time which is great news. 07/25/2020 upon evaluation today patient appears to be doing well with regard to her wound. She has  been tolerating the dressing changes without complication. Fortunately there is no signs of active infection at this time and her wound appears to be completely healed. Readmission: 09/30/2020 upon evaluation today patient appears for reevaluation here in our clinic. She was discharged May 5 that unfortunately has started to have weeping and wounds on the left leg at this point. She tells me the right leg did weep some but nothing as significant as the left. With all that being said at this point the patient states that she figured it was best to come in here and she was having a lot of drainage and it was get all of her sheets and everything. She has been wearing her Velcro wraps sometimes but it does not sound like she is been wearing them all the time which I think is part of the reason why this reopened. She also unfortunately had a fall which was quite significant. This occurred I believe about a week or 2 ago. Otherwise patient's past medical history really has not changed significantly. 10/07/2020 upon evaluation today patient appears to be doing better in regard to her wounds of the lower extremities. Fortunately there does not appear to be any signs of active infection at this time which is great news. I do not see any evidence of anything worsening but again she continues to have significant bilateral stage III lymphedema. This has been an ongoing issue. We actually have seen her this year pretty much from March through May of 2022. Following this she continued with compression at home following but then unfortunately reopened again. That was on July 11. With that being said this is an ongoing and recurrent issue for which honestly have seen her over the past 2 years pretty much consistently. Nonetheless we have had Velcro compression wraps that she uses at home and despite this she continues to have frequent and recurrent ulcerations. 7/27; patient presents for 1 week follow-up. She has no  issues or complaints today. She denies signs of infection. 10/21/2020 upon evaluation today patient's leg actually appears to be doing decently well on the left there is nothing open on the right at all and I am pleased in that regard. On the left side I do think that she still has some areas of weeping I believe the compression wrap is still the best way to go although I am hopeful that we are headed in the right direction as far as getting this area to heal and silver. Upon inspection patient's wound bed actually showed signs of good granulation epithelization at this point. There does not appear to be any evidence of active infection which is great news and overall I am extremely pleased with where things stand. 11/04/2020 upon evaluation today patient appears to be doing well with regard to her left leg. In fact this is getting very close to complete granulation and epithelization at this point I am very pleased with where we stand. I do not see any signs of infection currently. 11/18/2020 upon evaluation today patient appears to be doing well in regard  to her wound. She is in fact doing extremely well considering the fact we did need to see her last week she was apparently sick and missed her appointment. She is therefore had the wrap on for 2 weeks. Fortunately there is no signs of active infection at this time. No fevers, chills, nausea, vomiting, or diarrhea. 11/28/2020 upon evaluation today patient appears to be doing well with regard to her legs. Things are healing nicely and overall I do not see any signs of infection at this time which is great news. No fevers, chills, nausea, vomiting, or diarrhea. With that being said she still has a couple areas that are open and weeping currently. 12/10/2020 upon evaluation today patient appears to be doing well with regard to her wound. She has been tolerating the dressing changes without complication. Fortunately there does not appear to be any signs of  active infection at this time. No fevers, chills, nausea, vomiting, or diarrhea. 10/11; patient comes in today with really excellent edema control. Everything appears to be closed in clinic occluding the left lateral. The area on the right anterior has some eschar on the top I did not remove this. She has dry scaly skin which might benefit from ammonium lactate but for now I just discharged her with a moisturizer. The patient is also apparently fractured her right great toe. We gave her some stockinette to wear under her juxta lite on that side Electronic Signature(s) Signed: 12/31/2020 3:54:48 PM By: Linton Ham MD Entered By: Linton Ham on 12/31/2020 15:50:51 [name], [name] ([id number]) -------------------------------------------------------------------------------- Physical Exam Details Patient Name: [name] Date of Service: 12/31/2020 3:15 PM Medical Record Number: [id number] Patient Account Number: [id number] Date of Birth/Sex: 08/02/1965 (55 y.o. F) Treating RN: [name] Primary Care Provider: Laurance [name] Other Clinician: Referring Provider: Laurance [name] Treating Provider/Extender: [name] in Treatment: 26 Constitutional Patient is hypertensive.. Pulse regular and within target range for patient.Marland Kitchen Respirations regular, non-labored and within target range.. Temperature is normal and within the target range for the patient.Marland Kitchen appears in no distress. Notes Wound exam; the patient has no open wounds. The edema control in her legs is really excellent. She has dry flaking hyperkeratotic surface epithelium but with that off her skin looks quite normal. She will need to use frequent moisturizer Electronic Signature(s) Signed: 12/31/2020 3:54:48 PM By: Linton Ham MD Entered By: Linton Ham on 12/31/2020 15:52:40 [name], [name] ([id number]) -------------------------------------------------------------------------------- Physician Orders  Details Patient Name: [name] Date of Service: 12/31/2020 3:15 PM Medical Record Number: [id number] Patient Account Number: [id number] Date of Birth/Sex: Apr 04, 1965 (55 y.o. F) Treating RN: [name] Primary Care Provider: Laurance [name] Other Clinician: Referring Provider: Laurance [name] Treating Provider/Extender: [name] in Treatment: 74 Verbal / Phone Orders: No Diagnosis Coding ICD-10 Coding Code Description I87.2 Venous insufficiency (chronic) (peripheral) I89.0 Lymphedema, not elsewhere classified L97.822 Non-pressure chronic ulcer of other part of left lower leg with fat layer exposed E11.622 Type 2 diabetes mellitus with other skin ulcer N18.30 Chronic kidney disease, stage 3 unspecified I10 Essential (primary) hypertension I50.42 Chronic combined systolic (congestive) and diastolic (congestive) heart failure Discharge From [facility] o Discharge from [place] [facility] o Wear compression garments daily. Put garments on first thing when you wake up and remove them before bed. o Moisturize legs daily after removing compression garments. o Elevate, Exercise Daily and Avoid Standing for Long Periods of Time. o DO YOUR BEST to sleep in the bed at night. DO NOT sleep in  your recliner. Long hours of sitting in a recliner leads to swelling of the legs and/or potential wounds on your backside. Wound Treatment Electronic Signature(s) Signed: 12/31/2020 4:13:10 PM By: [name] Coria RN Entered By: [name] on 12/31/2020 16:13:09 [name], [name] ([id number]) -------------------------------------------------------------------------------- Problem List Details Patient Name: [name] Date of Service: 12/31/2020 3:15 PM Medical Record Number: [id number] Patient Account Number: [id number] Date of Birth/Sex: Nov 18, 1965 (55 y.o. F) Treating RN: [name] Primary Care Provider: Laurance [name] Other Clinician: Referring Provider:  Laurance [name] Treating Provider/Extender: [name] in Treatment: 13 Active Problems ICD-10 Encounter Code Description Active Date MDM Diagnosis I87.2 Venous insufficiency (chronic) (peripheral) 09/30/2020 No Yes I89.0 Lymphedema, not elsewhere classified 09/30/2020 No Yes L97.822 Non-pressure chronic ulcer of other part of left lower leg with fat layer 09/30/2020 No Yes exposed E11.622 Type 2 diabetes mellitus with other skin ulcer 09/30/2020 No Yes N18.30 Chronic kidney disease, stage 3 unspecified 09/30/2020 No Yes I10 Essential (primary) hypertension 09/30/2020 No Yes I50.42 Chronic combined systolic (congestive) and diastolic (congestive) heart 09/30/2020 No Yes failure Inactive Problems Resolved Problems Electronic Signature(s) Signed: 12/31/2020 3:54:48 PM By: Linton Ham MD Entered By: Linton Ham on 12/31/2020 15:49:31 [name], [name] ([id number]) -------------------------------------------------------------------------------- Progress Note Details Patient Name: [name] Date of Service: 12/31/2020 3:15 PM Medical Record Number: [id number] Patient Account Number: [id number] Date of Birth/Sex: 1965/05/12 (55 y.o. F) Treating RN: [name] Primary Care Provider: Laurance [name] Other Clinician: Referring Provider: Laurance [name] Treating Provider/Extender: [name] in Treatment: 13 Subjective History of Present Illness (HPI) 10/24/2018 on evaluation today patient presents for initial evaluation or clinic concerning issues that she has been having with lymphedema for quite some time. Unfortunately she has several wound openings at this point that secondary to her lymphedema/venous stasis are giving her trouble and leaking quite severely. She also has diabetes along with hypertension and stage III chronic kidney disease. Fortunately there is no signs of active infection at this time. She is going to likely require debridement of the left leg ulcer  upon evaluation today just based on what I am seeing. Fortunately there is no wound opening on the right. Overall the patient seems to be doing quite well and again there is no evidence of systemic infection which is good news. No fevers, chills, nausea, vomiting, or diarrhea. 10/31/2018 on evaluation today patient actually appears to be doing excellent in regard to her lower extremity ulcers. She has been tolerating the dressing changes without complication. Fortunately there is no signs of active infection at this time. She has tolerated 3 layer compression wrap without complication. 11/07/2018 upon evaluation today patient actually appears to be doing very well with regard to her left lower extremity ulcers. She has been tolerating the dressing changes without complication. Fortunately there is no signs of active infection. No fevers, chills, nausea, vomiting, or diarrhea. 11/21/2018 upon evaluation today patient appears to be doing quite well with regard to her lower extremity ulcers. In fact both areas seem to be showing signs of good improvement which is excellent. She is not having as much pain as she has in the past and again has a lot of healing compared to previous visits as well. 12/05/2018 on evaluation today patient presents for follow-up concerning her ongoing issues with her bilateral lower extremity ulcers. The good news is her right lower extremity is showing signs of completely healing at this time which is great news. Fortunately there is no evidence of active infection. On the left she has  just a very small area still remaining that is open at this time all in all she is very close to complete closure in my opinion. She does have compression stockings to wear at home. 12/12/2018 on evaluation today patient appears to be doing excellent in regard to her left lateral lower extremity ulcer. She has been tolerating the dressing changes without complication. Fortunately there is no signs  of active infection at this time. In fact this appears to be pretty much healed at this point although again she is not 100% today. No fevers, chills, nausea, vomiting, or diarrhea. 12/19/2018 on evaluation today patient actually appears to be doing excellent with regard to her lower extremity ulcer in fact this appears to be completely healed today which is all some. She has done extremely well with wound care measures. No fevers, chills, nausea, vomiting, or diarrhea. ------------------------------------ 11/01/19-Readmission to the clinic Patient presents with left leg pain, wounds posterior calf, onset about 4 weeks, denies any fevers chills or shakes, has not been using anything to these wounds, was given compression stockings at last discharge from the clinic but has not been able to use them as they rolled down and cause creases Patient's history significant for type 2 diabetes insulin requiring, A1c of 6.9 lately, hypertension, chronic pain 8/18; patient readmitted that the clinic last week. She has wounds on her left lateral posterior lower leg all of this in close juxtaposition i.e. a localized site. We've been using silver alginate under 3 layer compression apparently the wound surface area is better. She was wearing compression stockings from elastic therapy but says they were falling down. As she progresses more towards healing will need to address what we use in terms of compression stockings perhaps external compression garments 11/17/2019 on evaluation today patient appears to be doing well with regard to her legs currently. She does have 2 wounds which are actually measuring much better the more posterior is actually doing very well which I am pleased with the other though smaller is not quite a small but nonetheless on the lateral aspect does seem to be improving. 11/23/2019 on evaluation today patient appears to be doing well in regard to her wounds. In fact the wound on the posterior  aspect of her leg appears healed the lateral aspect is still open but extremely small. 9/15; this is a patient with chronic venous insufficiency and secondary lymphedema. She did not keep her clinic appointment last week and hence the left leg is a lot more swollen since she had to take the wrap off at some point. She has a small weeping area on the left lateral lower leg. She has probably a juxta lite stocking for the left leg I have asked her to bring that in when she comes to her clinic appointment next week at which time she should be healed 10/6; this is a patient with chronic venous insufficiency and secondary lymphedema left greater than right. Skin changes of chronic lymphedema especially in the left leg. She has not been here in 3 weeks. She took the wrap off 2 weeks ago. She has some very old 20/30 stockings from elastic therapy in [place] she has been using. Fortunately her leg is closed. She also has a single Farrow wrap for the left leg Readmission: 06/18/2020 upon evaluation today patient presents for reevaluation here in the clinic. She is a long-term patient that we have seen intermittently when she has had flareups of issues with her lower extremity secondary to chronic venous  stasis. With that being said unfortunately today she is having a significant issue with a wound which is actually quite significant over the right lower extremity. She did see dermatology they put Vaseline followed by T[place]a [place] dressings on her unfortunately she is continuing to have significant issues however here with pain and in fact this wrap was extremely stuck to her upon evaluation today. Nonetheless I believe that she is going to require a little bit different approach to try to keep things from sticking and hopefully aid in getting this to heal more effectively and quickly. With that being said she does have a history of [name], [name] ([id number]) chronic venous insufficiency, lymphedema, diabetes  mellitus type 2, chronic kidney disease stage III, hypertension, and congestive heart failure. This started as an injury in mid February and has worsened over the past 2 weeks. 07/04/2020 upon evaluation today patient appears to be doing well with regard to her wound on the leg. Overall I am extremely pleased with where things stand and I do think that she is making excellent progress. There is no sign of infection right now which is also great news. I think that she is close to complete resolution. 07/09/20 upon evaluation today patient appears to be doing excellent in regard to her leg ulcer. She has been tolerating the dressing changes without complication and in general I am extremely pleased with where things stand today. There does not appear to be any signs of active infection at this time which is great news. 07/25/2020 upon evaluation today patient appears to be doing well with regard to her wound. She has been tolerating the dressing changes without complication. Fortunately there is no signs of active infection at this time and her wound appears to be completely healed. Readmission: 09/30/2020 upon evaluation today patient appears for reevaluation here in our clinic. She was discharged May 5 that unfortunately has started to have weeping and wounds on the left leg at this point. She tells me the right leg did weep some but nothing as significant as the left. With all that being said at this point the patient states that she figured it was best to come in here and she was having a lot of drainage and it was get all of her sheets and everything. She has been wearing her Velcro wraps sometimes but it does not sound like she is been wearing them all the time which I think is part of the reason why this reopened. She also unfortunately had a fall which was quite significant. This occurred I believe about a week or 2 ago. Otherwise patient's past medical history really has not changed  significantly. 10/07/2020 upon evaluation today patient appears to be doing better in regard to her wounds of the lower extremities. Fortunately there does not appear to be any signs of active infection at this time which is great news. I do not see any evidence of anything worsening but again she continues to have significant bilateral stage III lymphedema. This has been an ongoing issue. We actually have seen her this year pretty much from March through May of 2022. Following this she continued with compression at home following but then unfortunately reopened again. That was on July 11. With that being said this is an ongoing and recurrent issue for which honestly have seen her over the past 2 years pretty much consistently. Nonetheless we have had Velcro compression wraps that she uses at home and despite this she continues to have frequent and  recurrent ulcerations. 7/27; patient presents for 1 week follow-up. She has no issues or complaints today. She denies signs of infection. 10/21/2020 upon evaluation today patient's leg actually appears to be doing decently well on the left there is nothing open on the right at all and I am pleased in that regard. On the left side I do think that she still has some areas of weeping I believe the compression wrap is still the best way to go although I am hopeful that we are headed in the right direction as far as getting this area to heal and silver. Upon inspection patient's wound bed actually showed signs of good granulation epithelization at this point. There does not appear to be any evidence of active infection which is great news and overall I am extremely pleased with where things stand. 11/04/2020 upon evaluation today patient appears to be doing well with regard to her left leg. In fact this is getting very close to complete granulation and epithelization at this point I am very pleased with where we stand. I do not see any signs of infection  currently. 11/18/2020 upon evaluation today patient appears to be doing well in regard to her wound. She is in fact doing extremely well considering the fact we did need to see her last week she was apparently sick and missed her appointment. She is therefore had the wrap on for 2 weeks. Fortunately there is no signs of active infection at this time. No fevers, chills, nausea, vomiting, or diarrhea. 11/28/2020 upon evaluation today patient appears to be doing well with regard to her legs. Things are healing nicely and overall I do not see any signs of infection at this time which is great news. No fevers, chills, nausea, vomiting, or diarrhea. With that being said she still has a couple areas that are open and weeping currently. 12/10/2020 upon evaluation today patient appears to be doing well with regard to her wound. She has been tolerating the dressing changes without complication. Fortunately there does not appear to be any signs of active infection at this time. No fevers, chills, nausea, vomiting, or diarrhea. 10/11; patient comes in today with really excellent edema control. Everything appears to be closed in clinic occluding the left lateral. The area on the right anterior has some eschar on the top I did not remove this. She has dry scaly skin which might benefit from ammonium lactate but for now I just discharged her with a moisturizer. The patient is also apparently fractured her right great toe. We gave her some stockinette to wear under her juxta lite on that side Objective Constitutional Patient is hypertensive.. Pulse regular and within target range for patient.Marland Kitchen Respirations regular, non-labored and within target range.. Temperature is normal and within the target range for the patient.Marland Kitchen appears in no distress. Vitals Time Taken: 3:22 PM, Temperature: 99.6 F, Pulse: 92 bpm, Respiratory Rate: 18 breaths/min, Blood Pressure: 148/65 mmHg. General Notes: Wound exam; the patient has no  open wounds. The edema control in her legs is really excellent. She has dry flaking [name], [name] ([id number]) hyperkeratotic surface epithelium but with that off her skin looks quite normal. 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 The patient can be discharged to her own juxta lite stockings. 2. We gave her a stockinette to wear under the juxta lite on the right so she would not traumatize her right great toe 3. I wondered about giving her some ammonium lactate but she has moisturizer at home Electronic Signature(s) Signed: 12/31/2020 3:54:48 PM By: Linton Ham MD Entered By: Linton Ham on 12/31/2020 15:53:29 [name], [name] ([id number]) -------------------------------------------------------------------------------- SuperBill Details Patient Name: [name] Date of Service: 12/31/2020 Medical Record Number: [id number] Patient Account Number: [id number] Date of Birth/Sex: 03/04/1966 (55 y.o. F) Treating RN: [name] Primary Care Provider: Laurance [name] Other Clinician: Referring Provider: Laurance [name] Treating Provider/Extender: [name] in Treatment: 13 Diagnosis Coding ICD-10 Codes Code Description I87.2 Venous insufficiency (chronic) (peripheral) I89.0 Lymphedema, not elsewhere classified L97.822 Non-pressure chronic ulcer of other part of left lower leg with fat layer exposed E11.622 Type 2 diabetes mellitus with other skin ulcer N18.30 Chronic kidney disease, stage 3 unspecified I10 Essential (primary) hypertension I50.42 Chronic combined  systolic (congestive) and diastolic (congestive) heart failure Facility Procedures CPT4 Code: 28833744 Description: 99213 - WOUND CARE VISIT-LEV 3 EST PT Modifier: Quantity: 1 Physician Procedures CPT4 Code: 5146047 Description: 99872 - WC PHYS LEVEL 2 - EST PT Modifier: Quantity: 1 CPT4 Code: Description: ICD-10 Diagnosis Description I89.0 Lymphedema, not elsewhere classified I87.2 Venous insufficiency (chronic) (peripheral) Modifier: Quantity: Electronic Signature(s) Signed: 12/31/2020 4:14:20 PM By: [name] Coria RN Previous Signature: 12/31/2020 3:54:48 PM Version By: Linton Ham MD Entered By: [name] on 12/31/2020 16:14:20

## 2021-01-02 NOTE — Progress Notes (Signed)
RAYMIE, TRANI ([id number]) Visit Report for 12/31/2020 Arrival Information Details Patient Name: [name], [name] Date of Service: 12/31/2020 3:15 PM Medical Record Number: [id number] Patient Account Number: [id number] Date of Birth/Sex: December 17, 1965 (55 y.o. F) Treating RN: Carlene Coria Primary Care Darlyne Schmiesing: Laurance Flatten Other Clinician: Referring Kearie Mennen: Laurance Flatten Treating Josphine Laffey/Extender: Tito Dine in Treatment: 44 Visit Information History Since Last Visit All ordered tests and consults were completed: No Patient Arrived: Ambulatory Added or deleted any medications: No Arrival Time: 15:16 Any new allergies or adverse reactions: No Accompanied By: self Had a fall or experienced change in No Transfer Assistance: None activities of daily living that may affect Patient Identification Verified: Yes risk of falls: Secondary Verification Process Completed: Yes Signs or symptoms of abuse/neglect since last visito No Patient Requires Transmission-Based Precautions: No Hospitalized since last visit: No Patient Has Alerts: No Implantable device outside of the clinic excluding No cellular tissue based products placed in the center since last visit: Has Dressing in Place as Prescribed: Yes Has Compression in Place as Prescribed: Yes Pain Present Now: No Electronic Signature(s) Signed: 01/02/2021 1:01:44 PM By: Carlene Coria RN Entered By: Carlene Coria on 12/31/2020 15:22:50 [place], Toma ([id number]) -------------------------------------------------------------------------------- Clinic Level of Care Assessment Details Patient Name: [name], [name] Date of Service: 12/31/2020 3:15 PM Medical Record Number: [id number] Patient Account Number: [id number] Date of Birth/Sex: [birth year] (55 y.o. F) Treating RN: Carlene Coria Primary Care Jarmar Rousseau: Laurance Flatten Other Clinician: Referring Rheta Hemmelgarn: Laurance Flatten Treating Walid Haig/Extender: Tito Dine in Treatment:  13 Clinic Level of Care Assessment Items TOOL 4 Quantity Score X - Use when only an EandM is performed on FOLLOW-UP visit 1 0 ASSESSMENTS - Nursing Assessment / Reassessment X - Reassessment of Co-morbidities (includes updates in patient status) 1 10 X- 1 5 Reassessment of Adherence to Treatment Plan ASSESSMENTS - Wound and Skin Assessment / Reassessment []  - Simple Wound Assessment / Reassessment - one wound 0 X- 2 5 Complex Wound Assessment / Reassessment - multiple wounds []  - 0 Dermatologic / Skin Assessment (not related to wound area) ASSESSMENTS - Focused Assessment []  - Circumferential Edema Measurements - multi extremities 0 []  - 0 Nutritional Assessment / Counseling / Intervention []  - 0 Lower Extremity Assessment (monofilament, tuning fork, pulses) []  - 0 Peripheral Arterial Disease Assessment (using hand held doppler) ASSESSMENTS - Ostomy and/or Continence Assessment and Care []  - Incontinence Assessment and Management 0 []  - 0 Ostomy Care Assessment and Management (repouching, etc.) PROCESS - Coordination of Care X - Simple Patient / Family Education for ongoing care 1 15 []  - 0 Complex (extensive) Patient / Family Education for ongoing care []  - 0 Staff obtains Programmer, systems, Records, Test Results / Process Orders []  - 0 Staff telephones HHA, Nursing Homes / Clarify orders / etc []  - 0 Routine Transfer to another Facility (non-emergent condition) []  - 0 Routine Hospital Admission (non-emergent condition) []  - 0 New Admissions / Biomedical engineer / Ordering NPWT, Apligraf, etc. []  - 0 Emergency Hospital Admission (emergent condition) X- 1 10 Simple Discharge Coordination []  - 0 Complex (extensive) Discharge Coordination PROCESS - Special Needs []  - Pediatric / Minor Patient Management 0 []  - 0 Isolation Patient Management []  - 0 Hearing / Language / Visual special needs []  - 0 Assessment of Community assistance (transportation, D/C planning,  etc.) []  - 0 Additional assistance / Altered mentation []  - 0 Support Surface(s) Assessment (bed, cushion, seat, etc.) INTERVENTIONS - Wound Cleansing / Measurement Fujimoto, Mileigh ([id number]) []  - 0 Simple  Wound Cleansing - one wound X- 2 5 Complex Wound Cleansing - multiple wounds X- 1 5 Wound Imaging (photographs - any number of wounds) []  - 0 Wound Tracing (instead of photographs) []  - 0 Simple Wound Measurement - one wound X- 2 5 Complex Wound Measurement - multiple wounds INTERVENTIONS - Wound Dressings []  - Small Wound Dressing one or multiple wounds 0 []  - 0 Medium Wound Dressing one or multiple wounds []  - 0 Large Wound Dressing one or multiple wounds []  - 0 Application of Medications - topical []  - 0 Application of Medications - injection INTERVENTIONS - Miscellaneous []  - External ear exam 0 []  - 0 Specimen Collection (cultures, biopsies, blood, body fluids, etc.) []  - 0 Specimen(s) / Culture(s) sent or taken to Lab for analysis []  - 0 Patient Transfer (multiple staff / Civil Service fast streamer / Similar devices) []  - 0 Simple Staple / Suture removal (25 or less) []  - 0 Complex Staple / Suture removal (26 or more) []  - 0 Hypo / Hyperglycemic Management (close monitor of Blood Glucose) []  - 0 Ankle / Brachial Index (ABI) - do not check if billed separately X- 1 5 Vital Signs Has the patient been seen at the hospital within the last three years: Yes Total Score: 80 Level Of Care: New/Established - Level 3 Electronic Signature(s) Signed: 01/02/2021 1:01:44 PM By: Carlene Coria RN Entered By: Carlene Coria on 12/31/2020 16:14:07 Aldaz, [name] ([id number]) -------------------------------------------------------------------------------- Encounter Discharge Information Details Patient Name: [name] Date of Service: 12/31/2020 3:15 PM Medical Record Number: [id number] Patient Account Number: [id number] Date of Birth/Sex: 17-Jan-1966 (55 y.o. F) Treating RN: Carlene Coria Primary Care Patterson Hollenbaugh: Laurance Flatten Other Clinician: Referring Randilyn Foisy: Laurance Flatten Treating Kha Hari/Extender: Tito Dine in Treatment: 13 Encounter Discharge Information Items Discharge Condition: Stable Ambulatory Status: Cane Discharge Destination: Home Transportation: Private Auto Accompanied By: self Schedule Follow-up Appointment: Yes Clinical Summary of Care: Patient Declined Electronic Signature(s) Signed: 12/31/2020 4:15:16 PM By: Carlene Coria RN Entered By: Carlene Coria on 12/31/2020 16:15:16 Tworek, Candela ([id number]) -------------------------------------------------------------------------------- Lower Extremity Assessment Details Patient Name: [name], KULZER Date of Service: 12/31/2020 3:15 PM Medical Record Number: [id number] Patient Account Number: [id number] Date of Birth/Sex: 03-02-1966 (55 y.o. F) Treating RN: Carlene Coria Primary Care Alitza Cowman: Laurance Flatten Other Clinician: Referring Aedin Jeansonne: Laurance Flatten Treating Analeia Ismael/Extender: Tito Dine in Treatment: 13 Edema Assessment Assessed: [Left: No] [Right: No] Edema: [Left: N] [Right: o] Calf Left: Right: Point of Measurement: 37 cm From Medial Instep 47 cm Ankle Left: Right: Point of Measurement: 9 cm From Medial Instep 27 cm Vascular Assessment Pulses: Dorsalis Pedis Palpable: [Left:Yes] Electronic Signature(s) Signed: 12/31/2020 4:12:07 PM By: Carlene Coria RN Entered By: Carlene Coria on 12/31/2020 16:12:07 [place], [place] ([id number]) -------------------------------------------------------------------------------- Multi Wound Chart Details Patient Name: [name] Date of Service: 12/31/2020 3:15 PM Medical Record Number: [id number] Patient Account Number: [id number] Date of Birth/Sex: 02-08-1966 (55 y.o. F) Treating RN: Carlene Coria Primary Care Irva Loser: Laurance Flatten Other Clinician: Referring Gabe Glace: Laurance Flatten Treating Shaneeka Scarboro/Extender: Tito Dine in Treatment: 13 Vital Signs Height(in): Pulse(bpm): 48 Weight(lbs): Blood Pressure(mmHg): 148/65 Body Mass Index(BMI): Temperature(F): 99.6 Respiratory Rate(breaths/min): 18 Photos: [N/A:N/A] Wound Location: Left, Anterior Lower Leg Left, Posterior Lower Leg N/A Wounding Event: Gradually Appeared Gradually Appeared N/A Primary Etiology: Diabetic Wound/Ulcer of the Lower Diabetic Wound/Ulcer of the Lower N/A Extremity Extremity Comorbid History: Congestive Heart Failure, Coronary Congestive Heart Failure, Coronary N/A Artery Disease, Hypertension, Artery Disease, Hypertension, Myocardial Infarction, Peripheral Myocardial Infarction, Peripheral Venous Disease,  Type II Diabetes, Venous Disease, Type II Diabetes, End Stage Renal Disease, End Stage Renal Disease, Neuropathy Neuropathy Date Acquired: 09/20/2020 09/20/2020 N/A Weeks of Treatment: 13 13 N/A Wound Status: Open Open N/A Measurements L x W x D (cm) 0x0x0 0x0x0 N/A Area (cm) : 0 0 N/A Volume (cm) : 0 0 N/A % Reduction in Area: 100.00% 100.00% N/A % Reduction in Volume: 100.00% 100.00% N/A Classification: Grade 2 Grade 2 N/A Exudate Amount: None Present None Present N/A Granulation Amount: None Present (0%) None Present (0%) N/A Necrotic Amount: None Present (0%) None Present (0%) N/A Exposed Structures: Fascia: No Fascia: No N/A Fat Layer (Subcutaneous Tissue): Fat Layer (Subcutaneous Tissue): No No Tendon: No Tendon: No Muscle: No Muscle: No Joint: No Joint: No Bone: No Bone: No Epithelialization: Large (67-100%) Large (67-100%) N/A Treatment Notes Electronic Signature(s) Signed: 12/31/2020 4:12:26 PM By: Carlene Coria RN Entered By: Carlene Coria on 12/31/2020 16:12:25 Bordonaro, [name] ([id number]) -------------------------------------------------------------------------------- Multi-Disciplinary Care Plan Details Patient Name: [name] Date of Service: 12/31/2020 3:15 PM Medical Record  Number: [id number] Patient Account Number: [id number] Date of Birth/Sex: [birth year] (55 y.o. F) Treating RN: Carlene Coria Primary Care Nayleah Gamel: Laurance Flatten Other Clinician: Referring Deaisha Welborn: Laurance Flatten Treating Denzil Bristol/Extender: Tito Dine in Treatment: 13 Active Inactive Electronic Signature(s) Signed: 12/31/2020 4:12:17 PM By: Carlene Coria RN Entered By: Carlene Coria on 12/31/2020 16:12:16 Pardy, Sheily ([id number]) -------------------------------------------------------------------------------- Pain Assessment Details Patient Name: [name] Date of Service: 12/31/2020 3:15 PM Medical Record Number: [id number] Patient Account Number: [id number] Date of Birth/Sex: 24-Nov-1965 (55 y.o. F) Treating RN: Carlene Coria Primary Care Keyuna Cuthrell: Laurance Flatten Other Clinician: Referring Delno Blaisdell: Laurance Flatten Treating Jaxsin Bottomley/Extender: Tito Dine in Treatment: 13 Active Problems Location of Pain Severity and Description of Pain Patient Has Paino No Site Locations Pain Management and Medication Current Pain Management: Electronic Signature(s) Signed: 01/02/2021 1:01:44 PM By: Carlene Coria RN Entered By: Carlene Coria on 12/31/2020 15:23:14 Janis, [name] ([id number]) -------------------------------------------------------------------------------- Patient/Caregiver Education Details Patient Name: [name] Date of Service: 12/31/2020 3:15 PM Medical Record Number: [id number] Patient Account Number: [id number] Date of Birth/Gender: April 30, 1965 (55 y.o. F) Treating RN: Carlene Coria Primary Care Physician: Laurance Flatten Other Clinician: Referring Physician: Laurance Flatten Treating Physician/Extender: Tito Dine in Treatment: 13 Education Assessment Education Provided To: Patient Education Topics Provided Wound/Skin Impairment: Methods: Explain/Verbal Responses: State content correctly [employer]) Signed: 01/02/2021 1:01:44 PM By:  Carlene Coria RN Entered By: Carlene Coria on 12/31/2020 16:14:31 [place], [place] ([id number]) -------------------------------------------------------------------------------- Wound Assessment Details Patient Name: [name] Date of Service: 12/31/2020 3:15 PM Medical Record Number: [id number] Patient Account Number: [id number] Date of Birth/Sex: Jun 14, 1965 (55 y.o. F) Treating RN: Carlene Coria Primary Care Constantine Ruddick: Laurance Flatten Other Clinician: Referring Kattia Selley: Laurance Flatten Treating Varetta Chavers/Extender: Tito Dine in Treatment: 13 Wound Status Wound Number: 7 Primary Diabetic Wound/Ulcer of the Lower Extremity Etiology: Wound Location: Left, Anterior Lower Leg Wound Open Wounding Event: Gradually Appeared Status: Date Acquired: 09/20/2020 Comorbid Congestive Heart Failure, Coronary Artery Disease, Weeks Of Treatment: 13 History: Hypertension, Myocardial Infarction, Peripheral Venous Clustered Wound: No Disease, Type II Diabetes, End Stage Renal Disease, Neuropathy Photos Wound Measurements Length: (cm) 0 Width: (cm) 0 Depth: (cm) 0 Area: (cm) 0 Volume: (cm) 0 % Reduction in Area: 100% % Reduction in Volume: 100% Epithelialization: Large (67-100%) Tunneling: No Undermining: No Wound Description Classification: Grade 2 Exudate Amount: None Present Foul Odor After Cleansing: No Slough/Fibrino No Wound Bed Granulation Amount: None Present (0%) Exposed Structure Necrotic Amount: None  Present (0%) Fascia Exposed: No Fat Layer (Subcutaneous Tissue) Exposed: No Tendon Exposed: No Muscle Exposed: No Joint Exposed: No Bone Exposed: No Electronic Signature(s) Signed: 12/31/2020 4:11:16 PM By: Carlene Coria RN Entered By: Carlene Coria on 12/31/2020 16:11:15 [place], [place] ([id number]) -------------------------------------------------------------------------------- Wound Assessment Details Patient Name: [name] Date of Service: 12/31/2020 3:15  PM Medical Record Number: [id number] Patient Account Number: [id number] Date of Birth/Sex: [birth year] (55 y.o. F) Treating RN: Carlene Coria Primary Care Mylie Mccurley: Laurance Flatten Other Clinician: Referring Aziel Morgan: Laurance Flatten Treating Anastazja Isaac/Extender: Tito Dine in Treatment: 13 Wound Status Wound Number: 8 Primary Diabetic Wound/Ulcer of the Lower Extremity Etiology: Wound Location: Left, Posterior Lower Leg Wound Open Wounding Event: Gradually Appeared Status: Date Acquired: 09/20/2020 Comorbid Congestive Heart Failure, Coronary Artery Disease, Weeks Of Treatment: 13 History: Hypertension, Myocardial Infarction, Peripheral Venous Clustered Wound: No Disease, Type II Diabetes, End Stage Renal Disease, Neuropathy Photos Wound Measurements Length: (cm) 0 Width: (cm) 0 Depth: (cm) 0 Area: (cm) 0 Volume: (cm) 0 % Reduction in Area: 100% % Reduction in Volume: 100% Epithelialization: Large (67-100%) Tunneling: No Undermining: No Wound Description Classification: Grade 2 Exudate Amount: None Present Foul Odor After Cleansing: No Slough/Fibrino No Wound Bed Granulation Amount: None Present (0%) Exposed Structure Necrotic Amount: None Present (0%) Fascia Exposed: No Fat Layer (Subcutaneous Tissue) Exposed: No Tendon Exposed: No Muscle Exposed: No Joint Exposed: No Bone Exposed: No Electronic Signature(s) Signed: 12/31/2020 4:11:46 PM By: Carlene Coria RN Entered By: Carlene Coria on 12/31/2020 16:11:46 [place], [name] ([id number]) -------------------------------------------------------------------------------- [place] Details Patient Name: [name] Date of Service: 12/31/2020 3:15 PM Medical Record Number: [id number] Patient Account Number: [id number] Date of Birth/Sex: Jun 24, 1965 (55 y.o. F) Treating RN: Carlene Coria Primary Care Alic Hilburn: Laurance Flatten Other Clinician: Referring Lyn Deemer: Laurance Flatten Treating Everard Interrante/Extender: Tito Dine  in Treatment: 13 Vital Signs Time Taken: 15:22 Temperature (F): 99.6 Pulse (bpm): 92 Respiratory Rate (breaths/min): 18 Blood Pressure (mmHg): 148/65 Reference Range: 80 - 120 mg / dl Electronic Signature(s) Signed: 01/02/2021 1:01:44 PM By: Carlene Coria RN Entered By: Carlene Coria on 12/31/2020 15:23:08

## 2021-01-04 ENCOUNTER — Other Ambulatory Visit (HOSPITAL_COMMUNITY): Payer: Self-pay | Admitting: Internal Medicine

## 2021-01-07 ENCOUNTER — Ambulatory Visit: Payer: Medicare Other | Admitting: Physician Assistant

## 2021-01-09 ENCOUNTER — Telehealth: Payer: Self-pay | Admitting: *Deleted

## 2021-01-09 NOTE — Telephone Encounter (Signed)
-----   Message from Sueanne Margarita, MD sent at 12/16/2020  1:59 PM EDT ----- Patient has severe mask leak and high AHI with poor compliance - please find out what mask she is using and if her mask if leaing ----- Message ----- From: Myles Rosenthal Sent: 12/16/2020  10:29 AM EDT To: Sueanne Margarita, MD

## 2021-01-09 NOTE — Telephone Encounter (Signed)
Called [facility] on cell phone.

## 2021-01-10 NOTE — Telephone Encounter (Signed)
Patient called back to say she has a nasal mask

## 2021-01-13 ENCOUNTER — Other Ambulatory Visit (HOSPITAL_COMMUNITY): Payer: Self-pay

## 2021-01-13 ENCOUNTER — Telehealth: Payer: Self-pay | Admitting: Physician Assistant

## 2021-01-13 ENCOUNTER — Other Ambulatory Visit: Payer: Self-pay | Admitting: Physician Assistant

## 2021-01-13 MED ORDER — POTASSIUM CHLORIDE CRYS ER 10 MEQ PO TBCR: 20.0000 meq | EXTENDED_RELEASE_TABLET | Freq: Every day | ORAL | 3 refills | Status: DC

## 2021-01-13 NOTE — Telephone Encounter (Signed)
Pharmacy called asking for alternative to the 28meq of KCl liquid. Order 2 tablets of 31meq KCl tablet daily instead. If patient cannot take, then can try KCl powder mixed with apple sauce.

## 2021-01-14 ENCOUNTER — Ambulatory Visit: Payer: Medicare Other | Admitting: Physician Assistant

## 2021-01-17 ENCOUNTER — Encounter (HOSPITAL_COMMUNITY): Payer: Medicare Other | Admitting: Cardiology

## 2021-01-24 ENCOUNTER — Encounter (HOSPITAL_COMMUNITY): Payer: Medicare Other | Admitting: Cardiology

## 2021-01-30 ENCOUNTER — Encounter (HOSPITAL_COMMUNITY): Payer: Medicare Other | Admitting: Cardiology

## 2021-02-01 ENCOUNTER — Other Ambulatory Visit (HOSPITAL_COMMUNITY): Payer: Self-pay | Admitting: Cardiology

## 2021-02-07 ENCOUNTER — Encounter (HOSPITAL_COMMUNITY): Payer: Medicare Other | Admitting: Cardiology

## 2021-02-25 ENCOUNTER — Other Ambulatory Visit (HOSPITAL_COMMUNITY): Payer: Self-pay

## 2021-02-25 MED ORDER — FEBUXOSTAT 80 MG PO TABS: 1.0000 | ORAL_TABLET | Freq: Every morning | ORAL | 0 refills | Status: DC

## 2021-04-09 ENCOUNTER — Telehealth: Payer: Self-pay | Admitting: *Deleted

## 2021-04-09 DIAGNOSIS — G4733 Obstructive sleep apnea (adult) (pediatric): Secondary | ICD-10-CM

## 2021-04-09 NOTE — Telephone Encounter (Signed)
Patient called to say she has lost her cpap due to non compliance. She states she has been in the hospital, she had a family member to pass away and a grand child. There has been just a series of events that has happened to hurt her compliance. I called [facility] and spoke to them and they say the insurance has stopped paying and has offered the patient to either pay $800 for the unit straight out and own the cpap or return the unit or risk going to collections. At this point she has to return the unit, have an office visit to document why she failed the cpap and start over with a home sleep test.

## 2021-05-07 ENCOUNTER — Encounter: Payer: Medicare Other | Attending: Internal Medicine | Admitting: Internal Medicine

## 2021-05-07 DIAGNOSIS — N186 End stage renal disease: Secondary | ICD-10-CM | POA: Diagnosis not present

## 2021-05-07 DIAGNOSIS — G4733 Obstructive sleep apnea (adult) (pediatric): Secondary | ICD-10-CM | POA: Insufficient documentation

## 2021-05-07 DIAGNOSIS — L97512 Non-pressure chronic ulcer of other part of right foot with fat layer exposed: Secondary | ICD-10-CM | POA: Diagnosis not present

## 2021-05-07 DIAGNOSIS — L03115 Cellulitis of right lower limb: Secondary | ICD-10-CM | POA: Diagnosis not present

## 2021-05-07 DIAGNOSIS — T8130XA Disruption of wound, unspecified, initial encounter: Secondary | ICD-10-CM

## 2021-05-07 DIAGNOSIS — E11621 Type 2 diabetes mellitus with foot ulcer: Secondary | ICD-10-CM

## 2021-05-07 DIAGNOSIS — I132 Hypertensive heart and chronic kidney disease with heart failure and with stage 5 chronic kidney disease, or end stage renal disease: Secondary | ICD-10-CM | POA: Diagnosis not present

## 2021-05-07 DIAGNOSIS — Z794 Long term (current) use of insulin: Secondary | ICD-10-CM | POA: Insufficient documentation

## 2021-05-07 DIAGNOSIS — M869 Osteomyelitis, unspecified: Secondary | ICD-10-CM | POA: Insufficient documentation

## 2021-05-07 DIAGNOSIS — E1122 Type 2 diabetes mellitus with diabetic chronic kidney disease: Secondary | ICD-10-CM | POA: Insufficient documentation

## 2021-05-07 DIAGNOSIS — I890 Lymphedema, not elsewhere classified: Secondary | ICD-10-CM | POA: Insufficient documentation

## 2021-05-07 DIAGNOSIS — I509 Heart failure, unspecified: Secondary | ICD-10-CM | POA: Diagnosis not present

## 2021-05-07 DIAGNOSIS — X58XXXA Exposure to other specified factors, initial encounter: Secondary | ICD-10-CM | POA: Diagnosis not present

## 2021-05-07 NOTE — Progress Notes (Signed)
[name] ([id number]) Visit Report for 05/07/2021 Abuse Risk Screen Details Patient Name: [name], [name] Date of Service: 05/07/2021 12:45 PM Medical Record Number: [id number] Patient Account Number: [id number] Date of Birth/Sex: 12-Apr-1965 (56 y.o. F) Treating RN: Levora Dredge Primary Care Elcie Pelster: Laurance Flatten Other Clinician: Referring Smayan Hackbart: Laurance Flatten Treating Zaidin Blyden/Extender: Yaakov Guthrie in Treatment: 0 Abuse Risk Screen Items Answer ABUSE RISK SCREEN: Has anyone close to you tried to hurt or harm you recentlyo No Do you feel uncomfortable with anyone in your familyo No Has anyone forced you do things that you didnot want to doo No Electronic Signature(s) Signed: 05/07/2021 5:33:03 PM By: Levora Dredge Entered By: Levora Dredge on 05/07/2021 13:05:21 [name] ([id number]) -------------------------------------------------------------------------------- Activities of Daily Living Details Patient Name: [name] Date of Service: 05/07/2021 12:45 PM Medical Record Number: [id number] Patient Account Number: [id number] Date of Birth/Sex: 02/21/1966 (56 y.o. F) Treating RN: Levora Dredge Primary Care Keynan Heffern: Laurance Flatten Other Clinician: Referring Aspen Lawrance: Laurance Flatten Treating Dyneisha Murchison/Extender: Yaakov Guthrie in Treatment: 0 Activities of Daily Living Items Answer Activities of Daily Living (Please select one for each item) Drive Automobile Not Able Take Medications Completely Able Use Telephone Completely Able Care for Appearance Need Assistance Use Toilet Completely Able Bath / Shower Completely Able Dress Self Completely Able Feed Self Completely Able Walk Need Assistance Get In / Out Bed Need Assistance Housework Need Assistance Prepare Meals Need Assistance Handle Money Completely Able Shop for Self Need Assistance Electronic Signature(s) Signed: 05/07/2021 5:33:03 PM By: Levora Dredge Entered By: Levora Dredge  on 05/07/2021 [name] ([id number]) -------------------------------------------------------------------------------- Education Screening Details Patient Name: [name] Date of Service: 05/07/2021 12:45 PM Medical Record Number: [id number] Patient Account Number: [id number] Date of Birth/Sex: 12/26/1965 (55 y.o. F) Treating RN: Levora Dredge Primary Care Dru Laurel: Laurance Flatten Other Clinician: Referring Natha Guin: Laurance Flatten Treating Crosley Stejskal/Extender: Yaakov Guthrie in Treatment: 0 Learning Preferences/Education Level/Primary Language Learning Preference: Explanation, Demonstration, Video, Communication Board, Printed Material Highest Education Level: High School Preferred Language: English Cognitive Barrier Language Barrier: No Translator Needed: No Memory Deficit: No Emotional Barrier: No Cultural/Religious Beliefs Affecting Medical Care: No Physical Barrier Impaired Vision: Yes Glasses Impaired Hearing: No Decreased Hand dexterity: No Knowledge/Comprehension Knowledge Level: High Comprehension Level: High Ability to understand written instructions: High Ability to understand verbal instructions: High Motivation Anxiety Level: Calm Cooperation: Cooperative Education Importance: Acknowledges Need Interest in Health Problems: Asks Questions Perception: Coherent Willingness to Engage in Self-Management High Activities: Readiness to Engage in Self-Management High Activities: Electronic Signature(s) Signed: 05/07/2021 5:33:03 PM By: Levora Dredge Entered By: Levora Dredge on 05/07/2021 13:06:35 [name] ([id number]) -------------------------------------------------------------------------------- Fall Risk Assessment Details Patient Name: [name] Date of Service: 05/07/2021 12:45 PM Medical Record Number: [id number] Patient Account Number: [id number] Date of Birth/Sex: [birth year] (55 y.o. F) Treating RN: Levora Dredge Primary Care Jailon Schaible: Laurance Flatten Other Clinician: Referring Tyshaun Vinzant: Laurance Flatten Treating Emeril Stille/Extender: Yaakov Guthrie in Treatment: 0 Fall Risk Assessment Items Have you had 2 or more falls in the last 12 monthso 0 No Have you had any fall that resulted in injury in the last 12 monthso 0 No FALLS RISK SCREEN History of falling - immediate or within 3 months 0 No Secondary diagnosis (Do you have 2 or more medical diagnoseso) 0 No Ambulatory aid None/bed rest/wheelchair/nurse 0 No Crutches/cane/walker 15 Yes Furniture 0 No Intravenous therapy Access/Saline/Heparin Lock 0 No Gait/Transferring Normal/ bed rest/ wheelchair 0 No Weak (short steps with or without shuffle, stooped but able to lift head  while walking, may 10 Yes seek support from furniture) Impaired (short steps with shuffle, may have difficulty arising from chair, head down, impaired 0 No balance) Mental Status Oriented to own ability 0 Yes Electronic Signature(s) Signed: 05/07/2021 5:33:03 PM By: Levora Dredge Entered By: Levora Dredge on 05/07/2021 13:06:59 [name] ([id number]) -------------------------------------------------------------------------------- Foot Assessment Details Patient Name: [name] Date of Service: 05/07/2021 12:45 PM Medical Record Number: [id number] Patient Account Number: [id number] Date of Birth/Sex: Jul 03, 1965 (56 y.o. F) Treating RN: Levora Dredge Primary Care Roseanne Juenger: Laurance Flatten Other Clinician: Referring Stefan Karen: Laurance Flatten Treating Marshawn Normoyle/Extender: Yaakov Guthrie in Treatment: 0 Foot Assessment Items Site Locations + = Sensation present, - = Sensation absent, C = Callus, U = Ulcer R = Redness, W = Warmth, M = Maceration, PU = Pre-ulcerative lesion F = Fissure, S = Swelling, D = Dryness Assessment Right: Left: Other Deformity: No No Prior Foot Ulcer: No No Prior Amputation: No No Charcot Joint: No No Ambulatory Status:  Ambulatory With Help Assistance Device: Cane Gait: Steady Electronic Signature(s) Signed: 05/07/2021 5:33:03 PM By: Levora Dredge Entered By: Levora Dredge on 05/07/2021 13:42:33 [place], Cookie ([id number]) -------------------------------------------------------------------------------- Nutrition Risk Screening Details Patient Name: [name] Date of Service: 05/07/2021 12:45 PM Medical Record Number: [id number] Patient Account Number: [id number] Date of Birth/Sex: 07/26/1965 (56 y.o. F) Treating RN: Levora Dredge Primary Care Paisleigh Maroney: Laurance Flatten Other Clinician: Referring Thelonious Kauffmann: Laurance Flatten Treating Reinette Cuneo/Extender: Yaakov Guthrie in Treatment: 0 Height (in): Weight (lbs): Body Mass Index (BMI): Nutrition Risk Screening Items Score Screening NUTRITION RISK SCREEN: I have an illness or condition that made me change the kind and/or amount of food I eat 0 No I eat fewer than two meals per day 0 No I eat few fruits and vegetables, or milk products 0 No I have three or more drinks of beer, liquor or wine almost every day 0 No I have tooth or mouth problems that make it hard for me to eat 0 No I don't always have enough money to buy the food I need 0 No I eat alone most of the time 0 No I take three or more different prescribed or over-the-counter drugs a day 0 No Without wanting to, I have lost or gained 10 pounds in the last six months 0 No I am not always physically able to shop, cook and/or feed myself 0 No Nutrition Protocols Good Risk Protocol 0 No interventions needed Moderate Risk Protocol High Risk Proctocol Risk Level: Good Risk Score: 0 Notes pt states antibiotic causes changes to her taste Electronic Signature(s) Signed: 05/07/2021 5:33:03 PM By: Levora Dredge Entered By: Levora Dredge on 05/07/2021 13:07:45

## 2021-05-07 NOTE — Progress Notes (Signed)
[name], [name] ([id number]) Visit Report for 05/07/2021 Allergy List Details Patient Name: [name], [name] Date of Service: 05/07/2021 12:45 PM Medical Record Number: [id number] Patient Account Number: [id number] Date of Birth/Sex: Sep 21, 1965 (56 y.o. F) Treating RN: Levora Dredge Primary Care Brennin Durfee: Laurance Flatten Other Clinician: Referring Leafy Motsinger: Laurance Flatten Treating Novali Vollman/Extender: Yaakov Guthrie in Treatment: 0 Allergies Active Allergies Tylenol PM Reaction: hives diazepam latex Reaction: itching simvastatin Reaction: joint pain ciprofloxacin Reaction: anxiety levofloxacin Reaction: anxiety Omnicef Reaction: anxiety Insulins Allergy Notes Electronic Signature(s) Signed: 05/07/2021 5:33:03 PM By: Levora Dredge Entered By: Levora Dredge on 05/07/2021 12:47:15 Kawabata, [name] ([id number]) -------------------------------------------------------------------------------- Arrival Information Details Patient Name: [name] Date of Service: 05/07/2021 12:45 PM Medical Record Number: [id number] Patient Account Number: [id number] Date of Birth/Sex: Jul 28, 1965 (56 y.o. F) Treating RN: Levora Dredge Primary Care Vikas Wegmann: Laurance Flatten Other Clinician: Referring Taisei Bonnette: Laurance Flatten Treating Bayley Hurn/Extender: Yaakov Guthrie in Treatment: 0 Visit Information Patient Arrived: [name] Arrival Time: 12:48 Accompanied By: self Transfer Assistance: None Patient Identification Verified: Yes Secondary Verification Process Completed: Yes Patient Has Alerts: Yes Patient Alerts: diabetic History Since Last Visit Added or deleted any medications: Yes Any new allergies or adverse reactions: No Had a fall or experienced change in activities of daily living that may affect risk of falls: No Hospitalized since last visit: Yes Has Footwear/Offloading in Place as Prescribed: Yes Right: Felt/Foam Pain Present Now: Yes Notes amputation since last visit at wound  care Electronic Signature(s) Signed: 05/07/2021 5:33:03 PM By: Levora Dredge Entered By: Levora Dredge on 05/07/2021 12:55:48 Beste, Ly ([id number]) -------------------------------------------------------------------------------- Clinic Level of Care Assessment Details Patient Name: MARDA, BREIDENBACH Date of Service: 05/07/2021 12:45 PM Medical Record Number: [id number] Patient Account Number: [id number] Date of Birth/Sex: 11-29-1965 (56 y.o. F) Treating RN: Levora Dredge Primary Care Keyarah Mcroy: Laurance Flatten Other Clinician: Referring Kirk Sampley: Laurance Flatten Treating Damareon Lanni/Extender: Yaakov Guthrie in Treatment: 0 Clinic Level of Care Assessment Items TOOL 2 Quantity Score []  - Use when only an EandM is performed on the INITIAL visit 0 ASSESSMENTS - Nursing Assessment / Reassessment []  - General Physical Exam (combine w/ comprehensive assessment (listed just below) when performed on new 0 pt. evals) X- 1 25 Comprehensive Assessment (HX, ROS, Risk Assessments, Wounds Hx, etc.) ASSESSMENTS - Wound and Skin Assessment / Reassessment []  - Simple Wound Assessment / Reassessment - one wound 0 X- 2 5 Complex Wound Assessment / Reassessment - multiple wounds []  - 0 Dermatologic / Skin Assessment (not related to wound area) ASSESSMENTS - Ostomy and/or Continence Assessment and Care []  - Incontinence Assessment and Management 0 []  - 0 Ostomy Care Assessment and Management (repouching, etc.) PROCESS - Coordination of Care []  - Simple Patient / Family Education for ongoing care 0 X- 1 20 Complex (extensive) Patient / Family Education for ongoing care X- 1 10 Staff obtains Programmer, systems, Records, Test Results / Process Orders X- 1 10 Staff telephones HHA, Nursing Homes / Clarify orders / etc []  - 0 Routine Transfer to another Facility (non-emergent condition) []  - 0 Routine Hospital Admission (non-emergent condition) []  - 0 New Admissions / Biomedical engineer / Ordering  NPWT, Apligraf, etc. X- 1 20 Emergency Hospital Admission (emergent condition) []  - 0 Simple Discharge Coordination X- 1 15 Complex (extensive) Discharge Coordination PROCESS - Special Needs []  - Pediatric / Minor Patient Management 0 []  - 0 Isolation Patient Management []  - 0 Hearing / Language / Visual special needs []  - 0 Assessment of Community assistance (transportation, D/C planning, etc.) []  - 0 Additional assistance /  Altered mentation []  - 0 Support Surface(s) Assessment (bed, cushion, seat, etc.) INTERVENTIONS - Wound Cleansing / Measurement X - Wound Imaging (photographs - any number of wounds) 1 5 []  - 0 Wound Tracing (instead of photographs) []  - 0 Simple Wound Measurement - one wound X- 2 5 Complex Wound Measurement - multiple wounds [name], [name] ([id number]) []  - 0 Simple Wound Cleansing - one wound X- 2 5 Complex Wound Cleansing - multiple wounds INTERVENTIONS - Wound Dressings []  - Small Wound Dressing one or multiple wounds 0 X- 2 15 Medium Wound Dressing one or multiple wounds []  - 0 Large Wound Dressing one or multiple wounds []  - 0 Application of Medications - injection INTERVENTIONS - Miscellaneous []  - External ear exam 0 []  - 0 Specimen Collection (cultures, biopsies, blood, body fluids, etc.) []  - 0 Specimen(s) / Culture(s) sent or taken to Lab for analysis []  - 0 Patient Transfer (multiple staff / Civil Service fast streamer / Similar devices) []  - 0 Simple Staple / Suture removal (25 or less) []  - 0 Complex Staple / Suture removal (26 or more) []  - 0 Hypo / Hyperglycemic Management (close monitor of Blood Glucose) []  - 0 Ankle / Brachial Index (ABI) - do not check if billed separately Has the patient been seen at the hospital within the last three years: Yes Total Score: 165 Level Of Care: New/Established - Level 5 Electronic Signature(s) Signed: 05/07/2021 5:33:03 PM By: Levora Dredge Entered By: Levora Dredge on 05/07/2021  17:30:59 [name], [name] ([id number]) -------------------------------------------------------------------------------- Encounter Discharge Information Details Patient Name: [name] Date of Service: 05/07/2021 12:45 PM Medical Record Number: [id number] Patient Account Number: [id number] Date of Birth/Sex: Oct 15, 1965 (56 y.o. F) Treating RN: Levora Dredge Primary Care Sanaiyah Kirchhoff: Laurance Flatten Other Clinician: Referring Absalom Aro: Laurance Flatten Treating Alphonsa Brickle/Extender: Yaakov Guthrie in Treatment: 0 Encounter Discharge Information Items Discharge Condition: Stable Ambulatory Status: Cane Discharge Destination: Home Transportation: Private Auto Accompanied By: family Schedule Follow-up Appointment: Yes Clinical Summary of Care: Electronic Signature(s) Signed: 05/07/2021 5:32:37 PM By: Levora Dredge Entered By: Levora Dredge on 05/07/2021 17:32:37 [name], [name] ([id number]) -------------------------------------------------------------------------------- Lower Extremity Assessment Details Patient Name: [name] Date of Service: 05/07/2021 12:45 PM Medical Record Number: [id number] Patient Account Number: [id number] Date of Birth/Sex: [birth year] (56 y.o. F) Treating RN: Levora Dredge Primary Care Marcee Jacobs: Laurance Flatten Other Clinician: Referring Sintia Mckissic: Laurance Flatten Treating Lalani Winkles/Extender: Yaakov Guthrie in Treatment: 0 Edema Assessment Assessed: [Left: No] [Right: No] Edema: [Left: Yes] [Right: Yes] Calf Left: Right: Point of Measurement: 35 cm From Medial Instep 45.3 cm Ankle Left: Right: Point of Measurement: 10 cm From Medial Instep 25.5 cm Vascular Assessment Pulses: Dorsalis Pedis Palpable: [Left:No] Doppler Audible: [Left:Inaudible] Posterior Tibial Palpable: [Left:No Inaudible] Notes MD Hoffman made aware unable to hear pulses with doppler. Pt states had a recent ABI test done prior to surgery Electronic Signature(s) Signed:  05/07/2021 5:33:03 PM By: Levora Dredge Entered By: Levora Dredge on 05/07/2021 13:41:56 [name], [name] ([id number]) -------------------------------------------------------------------------------- Multi Wound Chart Details Patient Name: [name] Date of Service: 05/07/2021 12:45 PM Medical Record Number: [id number] Patient Account Number: [id number] Date of Birth/Sex: 10-Oct-1965 (56 y.o. 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/Ulcer of the Lower N/A Extremity Extremity Comorbid History: Sleep Apnea, Congestive Heart Sleep Apnea, Congestive Heart N/A Failure, Coronary Artery Disease, Failure, Coronary Artery Disease, Hypertension, Myocardial Infarction, Hypertension, Myocardial Infarction, Peripheral Venous Disease, Type II Peripheral Venous Disease, Type II Diabetes, End Stage Renal Disease, Diabetes, End Stage Renal Disease, Gout, Neuropathy Gout, Neuropathy Date Acquired: 03/13/2021 03/13/2021 N/A Weeks of Treatment: 0 0 N/A Wound Status: Open Open N/A Wound Recurrence: No No N/A Clustered Wound: Yes No N/A Clustered Quantity: 2 N/A N/A Measurements L x W x D (cm) 2x2.3x0.1 1.5x4.5x0.5 N/A Area (cm) : 3.613 5.301 N/A Volume (cm) : 0.361 2.651 N/A Classification: Grade 2 Grade 3 N/A Exudate Amount: Medium Medium N/A Exudate Type: Serosanguineous Serosanguineous N/A Exudate Color: red, brown red, brown N/A Granulation Amount: Small (1-33%) Small (1-33%) N/A Granulation  Quality: Pink, Pale Pink N/A Necrotic Amount: Large (67-100%) Large (67-100%) N/A Exposed Structures: Fat Layer (Subcutaneous Tissue): Fat Layer (Subcutaneous Tissue): N/A Yes Yes Epithelialization: None None N/A Treatment Notes Electronic Signature(s) Signed: 05/07/2021 2:22:40 PM By: Kalman Shan DO Entered By: Kalman Shan on 05/07/2021 14:04:01 [name], [name] ([id number]) -------------------------------------------------------------------------------- Multi-Disciplinary Care Plan Details Patient Name: [name] Date of Service: 05/07/2021 12:45 PM Medical Record Number: [id number] Patient Account Number: [id number] Date of Birth/Sex: 11-20-1965 (55 y.o. F) Treating RN: Levora Dredge Primary Care Armin Yerger: Laurance Flatten Other Clinician: Referring Chihiro Frey: Laurance Flatten Treating Jalexis Breed/Extender: Yaakov Guthrie in Treatment: 0 Active Inactive Orientation to the Wound Care Program Nursing Diagnoses: Knowledge deficit related to the wound healing center program Goals: Patient/caregiver will verbalize understanding of the [facility] Program Date Initiated: 05/07/2021 Target Resolution Date: 05/07/2021 Goal Status: Active Interventions: Provide education on orientation to the wound center Notes: Wound/Skin Impairment Nursing Diagnoses: Impaired tissue integrity Knowledge deficit related to ulceration/compromised skin integrity Goals: Ulcer/skin breakdown will have a volume reduction of 30% by week 4 Date Initiated: 05/07/2021 Target Resolution Date: 06/04/2021 Goal Status: Active Ulcer/skin breakdown will have a volume reduction of 50% by week 8 Date Initiated: 05/07/2021 Target Resolution Date: 07/02/2021 Goal Status: Active Ulcer/skin breakdown will have a volume reduction of 80% by week 12 Date Initiated: 05/07/2021 Target Resolution Date: 07/30/2021 Goal Status: Active Ulcer/skin breakdown will heal within 14 weeks Date Initiated:  05/07/2021 Target Resolution Date: 08/13/2021 Goal Status: Active Interventions: Assess patient/caregiver ability to obtain necessary supplies Assess patient/caregiver ability to perform ulcer/skin care regimen upon admission and as needed Assess ulceration(s) every visit Provide education on ulcer and skin care Notes: Electronic Signature(s) Signed: 05/07/2021 5:33:03 PM By: Levora Dredge Entered By: Levora Dredge on 05/07/2021 13:51:46 [name], [name] ([id number]) -------------------------------------------------------------------------------- Pain Assessment Details Patient Name: [name] Date of Service: 05/07/2021 12:45 PM Medical Record Number: [id number] Patient Account Number: [id number] Date of Birth/Sex: September 17, 1965 (56 y.o. F) Treating RN: Levora Dredge Primary Care Abie Killian: Laurance Flatten Other Clinician: Referring Fontella Shan: Laurance Flatten Treating Georga Stys/Extender: Yaakov Guthrie in Treatment: 0 Active Problems Location of Pain Severity and Description of Pain Patient Has Paino Yes Site Locations Rate the pain. Current Pain Level: 4 Pain Management and Medication Current Pain Management: Electronic Signature(s) Signed: 05/07/2021 5:33:03 PM By: Levora Dredge Entered By: Levora Dredge on 05/07/2021 12:56:00 [name], [name] ([id number]) -------------------------------------------------------------------------------- Patient/Caregiver Education Details Patient Name: [name] Date of Service: 05/07/2021 12:45 PM Medical Record Number: [id number] Patient Account Number: [id number] Date of Birth/Gender: 11-30-1965 (55 y.o. F) Treating RN: Levora Dredge Primary Care Physician: Laurance Flatten Other Clinician: Referring Physician: Laurance Flatten Treating Physician/Extender: Yaakov Guthrie in Treatment: 0  Education Assessment Education Provided To: Patient Education Topics Provided Welcome To [facility]: Handouts: Welcome To [facility] Methods: Explain/Verbal Responses: State content correctly Wound/Skin Impairment: Handouts: Caring for Your Ulcer Methods: Explain/Verbal Responses: State content correctly Electronic Signature(s) Signed: 05/07/2021 5:33:03 PM By: Levora Dredge Entered By: Levora Dredge on 05/07/2021 17:31:45 [name], [name] ([id number]) -------------------------------------------------------------------------------- Wound Assessment Details Patient Name: [name] Date of Service: 05/07/2021 12:45 PM Medical Record Number: [id number] Patient Account Number: [id number] Date of Birth/Sex: 31-Mar-1965 (56 y.o. F) Treating RN: Levora Dredge Primary Care Nelsie Domino: Laurance Flatten Other Clinician: Referring Io Dieujuste: Laurance Flatten Treating Meagen Limones/Extender: Yaakov Guthrie in Treatment: 0 Wound Status Wound Number: 10 Primary Diabetic Wound/Ulcer of the Lower Extremity Etiology: Wound Location: Right, Plantar Foot Wound Open Wounding Event: Surgical Injury Status: Date Acquired: 03/13/2021 Comorbid Sleep Apnea, Congestive Heart Failure, Coronary Artery Weeks Of Treatment: 0 History: Disease, Hypertension, Myocardial Infarction, Peripheral Clustered Wound: Yes Venous Disease, Type II Diabetes, End Stage Renal Disease, Gout, Neuropathy Photos Wound Measurements Length: (cm) 2 Width: (cm) 2.3 Depth: (cm) 0.1 Clustered Quantity: 2 Area: (cm) 3.613 Volume: (cm) 0.361 % Reduction in Area: % Reduction in Volume: Epithelialization: None Tunneling: No Undermining: No Wound Description Classification: Grade 2 Exudate Amount: Medium Exudate Type: Serosanguineous Exudate Color: red, brown Foul Odor After Cleansing: No Wound Bed Granulation Amount: Small (1-33%) Exposed Structure Granulation Quality: Pink, Pale Fat Layer (Subcutaneous Tissue) Exposed: Yes Necrotic Amount: Large (67-100%) Necrotic Quality: Adherent Therapist, music) Signed: 05/07/2021  5:33:03 PM By: Levora Dredge Entered By: Levora Dredge on 05/07/2021 13:33:01 [name], [name] ([id number]) -------------------------------------------------------------------------------- Wound Assessment Details Patient Name: [name] Date of Service: 05/07/2021 12:45 PM Medical Record Number: [id number] Patient Account Number: [id number] Date of Birth/Sex: 02-23-1966 (56 y.o. F) Treating RN: Levora Dredge Primary Care Breianna Delfino: Laurance Flatten Other Clinician: Referring Ayodele Hartsock: Laurance Flatten Treating Zenith Lamphier/Extender: Yaakov Guthrie in Treatment: 0 Wound Status Wound Number: 9 Primary Diabetic Wound/Ulcer of the Lower Extremity Etiology: Wound Location: Right, Midline Foot Wound Open Wounding Event: Surgical Injury Status: Date Acquired: 03/13/2021 Notes: amputation of right great toe Weeks Of Treatment: 0 Comorbid Sleep Apnea, Congestive Heart Failure, Coronary Artery Clustered Wound: No History: Disease, Hypertension, Myocardial Infarction, Peripheral Venous Disease, Type II Diabetes, End Stage Renal Disease, Gout, Neuropathy Photos Wound Measurements Length: (cm) 1.5 Width: (cm) 4.5 Depth: (cm) 0.5 Area: (cm) 5.301 Volume: (cm) 2.651 % Reduction in Area: % Reduction in Volume: Epithelialization: None Tunneling: No Undermining: No Wound Description Classification: Grade 3 Exudate Amount: Medium Exudate Type: Serosanguineous Exudate Color: red, brown Foul Odor After Cleansing: No Slough/Fibrino Yes Wound Bed Granulation Amount: Small (1-33%) Exposed Structure Granulation Quality: Pink Fat Layer (Subcutaneous Tissue) Exposed: Yes Necrotic Amount: Large (67-100%) Necrotic Quality: Adherent Therapist, music) Signed: 05/07/2021 5:33:03 PM By: Levora Dredge Entered By: Levora Dredge on 05/07/2021 13:31:29 [name], [name] ([id number]) -------------------------------------------------------------------------------- Vitals  Details Patient Name: [name] Date of Service: 05/07/2021 12:45 PM Medical Record Number: [id number] Patient Account Number: [id number] Date of Birth/Sex: 02/18/1966 (56 y.o. F) Treating RN: Levora Dredge Primary Care Nigel Wessman: Laurance Flatten Other Clinician: Referring Chiron Campione: Laurance Flatten Treating Mylez Venable/Extender: Yaakov Guthrie in Treatment: 0 Vital Signs Time Taken: 12:56 Temperature (F): 97.5 Pulse (bpm): 79 Respiratory Rate (breaths/min): 18 Blood Pressure (mmHg): 143/79 Reference Range: 80 - 120 mg / dl Electronic Signature(s) Signed: 05/07/2021 5:33:03 PM By: Levora Dredge Entered By: Levora Dredge on 05/07/2021 12:57:58

## 2021-05-08 ENCOUNTER — Other Ambulatory Visit: Payer: Self-pay | Admitting: Internal Medicine

## 2021-05-08 DIAGNOSIS — L97512 Non-pressure chronic ulcer of other part of right foot with fat layer exposed: Secondary | ICD-10-CM

## 2021-05-08 NOTE — Progress Notes (Addendum)
GAYATHRI, FUTRELL ([id number]) Visit Report for 05/07/2021 Chief Complaint Document Details Patient Name: [name], [name] Date of Service: 05/07/2021 12:45 PM Medical Record Number: [id number] Patient Account Number: [id number] Date of Birth/Sex: May 22, 1965 (56 y.o. F) Treating RN: Levora Dredge Primary Care Provider: Laurance Flatten Other Clinician: Referring Provider: Laurance Flatten Treating Provider/Extender: Yaakov Guthrie in Treatment: 0 Information Obtained from: Patient Chief Complaint 05/07/2021; surgical wound dehiscence to the right great toe amputation site. Electronic Signature(s) Signed: 05/07/2021 2:22:40 PM By: Kalman Shan DO Entered By: Kalman Shan on 05/07/2021 14:04:49 Pothier, Louan ([id number]) -------------------------------------------------------------------------------- HPI Details Patient Name: [name] Date of Service: 05/07/2021 12:45 PM Medical Record Number: [id number] Patient Account Number: [id number] Date of Birth/Sex: 1965-07-28 (55 y.o. F) Treating RN: Levora Dredge Primary Care Provider: Laurance Flatten Other Clinician: Referring Provider: Laurance Flatten Treating Provider/Extender: Yaakov Guthrie in Treatment: 0 History of Present Illness HPI Description: 10/24/2018 on evaluation today patient presents for initial evaluation or clinic concerning issues that she has been having with lymphedema for quite some time. Unfortunately she has several wound openings at this point that secondary to her lymphedema/venous stasis are giving her trouble and leaking quite severely. She also has diabetes along with hypertension and stage III chronic kidney disease. Fortunately there is no signs of active infection at this time. She is going to likely require debridement of the left leg ulcer upon evaluation today just based on what I am seeing. Fortunately there is no wound opening on the right. Overall the patient seems to be doing quite well and again  there is no evidence of systemic infection which is good news. No fevers, chills, nausea, vomiting, or diarrhea. 10/31/2018 on evaluation today patient actually appears to be doing excellent in regard to her lower extremity ulcers. She has been tolerating the dressing changes without complication. Fortunately there is no signs of active infection at this time. She has tolerated 3 layer compression wrap without complication. 11/07/2018 upon evaluation today patient actually appears to be doing very well with regard to her left lower extremity ulcers. She has been tolerating the dressing changes without complication. Fortunately there is no signs of active infection. No fevers, chills, nausea, vomiting, or diarrhea. 11/21/2018 upon evaluation today patient appears to be doing quite well with regard to her lower extremity ulcers. In fact both areas seem to be showing signs of good improvement which is excellent. She is not having as much pain as she has in the past and again has a lot of healing compared to previous visits as well. 12/05/2018 on evaluation today patient presents for follow-up concerning her ongoing issues with her bilateral lower extremity ulcers. The good news is her right lower extremity is showing signs of completely healing at this time which is great news. Fortunately there is no evidence of active infection. On the left she has just a very small area still remaining that is open at this time all in all she is very close to complete closure in my opinion. She does have compression stockings to wear at home. 12/12/2018 on evaluation today patient appears to be doing excellent in regard to her left lateral lower extremity ulcer. She has been tolerating the dressing changes without complication. Fortunately there is no signs of active infection at this time. In fact this appears to be pretty much healed at this point although again she is not 100% today. No fevers, chills, nausea,  vomiting, or diarrhea. 12/19/2018 on evaluation today patient actually appears to be doing excellent with  regard to her lower extremity ulcer in fact this appears to be completely healed today which is all some. She has done extremely well with wound care measures. No fevers, chills, nausea, vomiting, or diarrhea. ------------------------------------ 11/01/19-Readmission to the clinic Patient presents with left leg pain, wounds posterior calf, onset about 4 weeks, denies any fevers chills or shakes, has not been using anything to these wounds, was given compression stockings at last discharge from the clinic but has not been able to use them as they rolled down and cause creases Patient's history significant for type 2 diabetes insulin requiring, A1c of 6.9 lately, hypertension, chronic pain 8/18; patient readmitted that the clinic last week. She has wounds on her left lateral posterior lower leg all of this in close juxtaposition i.e. a localized site. We've been using silver alginate under 3 layer compression apparently the wound surface area is better. She was wearing compression stockings from elastic therapy but says they were falling down. As she progresses more towards healing will need to address what we use in terms of compression stockings perhaps external compression garments 11/17/2019 on evaluation today patient appears to be doing well with regard to her legs currently. She does have 2 wounds which are actually measuring much better the more posterior is actually doing very well which I am pleased with the other though smaller is not quite a small but nonetheless on the lateral aspect does seem to be improving. 11/23/2019 on evaluation today patient appears to be doing well in regard to her wounds. In fact the wound on the posterior aspect of her leg appears healed the lateral aspect is still open but extremely small. 9/15; this is a patient with chronic venous insufficiency and secondary  lymphedema. She did not keep her clinic appointment last week and hence the left leg is a lot more swollen since she had to take the wrap off at some point. She has a small weeping area on the left lateral lower leg. She has probably a juxta lite stocking for the left leg I have asked her to bring that in when she comes to her clinic appointment next week at which time she should be healed 10/6; this is a patient with chronic venous insufficiency and secondary lymphedema left greater than right. Skin changes of chronic lymphedema especially in the left leg. She has not been here in 3 weeks. She took the wrap off 2 weeks ago. She has some very old 20/30 stockings from elastic therapy in [place] she has been using. Fortunately her leg is closed. She also has a single [place] wrap for the left leg Readmission: 06/18/2020 upon evaluation today patient presents for reevaluation here in the clinic. She is a long-term patient that we have seen intermittently when she has had flareups of issues with her lower extremity secondary to chronic venous stasis. With that being said unfortunately today she is having a significant issue with a wound which is actually quite significant over the right lower extremity. She did see dermatology they put Vaseline followed by [place] dressings on her unfortunately she is continuing to have significant issues however here with pain and in fact this wrap was extremely stuck to her upon evaluation today. Nonetheless I believe that she is going to require a little bit different approach to try to keep things from sticking and hopefully aid in getting this to heal more effectively and quickly. With that being said she does have a history of Sherbert, Mailynn ([id number]) chronic venous  insufficiency, lymphedema, diabetes mellitus type 2, chronic kidney disease stage III, hypertension, and congestive heart failure. This started as an injury in mid February and has worsened over  the past 2 weeks. 07/04/2020 upon evaluation today patient appears to be doing well with regard to her wound on the leg. Overall I am extremely pleased with where things stand and I do think that she is making excellent progress. There is no sign of infection right now which is also great news. I think that she is close to complete resolution. 07/09/20 upon evaluation today patient appears to be doing excellent in regard to her leg ulcer. She has been tolerating the dressing changes without complication and in general I am extremely pleased with where things stand today. There does not appear to be any signs of active infection at this time which is great news. 07/25/2020 upon evaluation today patient appears to be doing well with regard to her wound. She has been tolerating the dressing changes without complication. Fortunately there is no signs of active infection at this time and her wound appears to be completely healed. Readmission: 09/30/2020 upon evaluation today patient appears for reevaluation here in our clinic. She was discharged May 5 that unfortunately has started to have weeping and wounds on the left leg at this point. She tells me the right leg did weep some but nothing as significant as the left. With all that being said at this point the patient states that she figured it was best to come in here and she was having a lot of drainage and it was get all of her sheets and everything. She has been wearing her Velcro wraps sometimes but it does not sound like she is been wearing them all the time which I think is part of the reason why this reopened. She also unfortunately had a fall which was quite significant. This occurred I believe about a week or 2 ago. Otherwise patient's past medical history really has not changed significantly. 10/07/2020 upon evaluation today patient appears to be doing better in regard to her wounds of the lower extremities. Fortunately there does not appear to be  any signs of active infection at this time which is great news. I do not see any evidence of anything worsening but again she continues to have significant bilateral stage III lymphedema. This has been an ongoing issue. We actually have seen her this year pretty much from March through May of 2022. Following this she continued with compression at home following but then unfortunately reopened again. That was on July 11. With that being said this is an ongoing and recurrent issue for which honestly have seen her over the past 2 years pretty much consistently. Nonetheless we have had Velcro compression wraps that she uses at home and despite this she continues to have frequent and recurrent ulcerations. 7/27; patient presents for 1 week follow-up. She has no issues or complaints today. She denies signs of infection. 10/21/2020 upon evaluation today patient's leg actually appears to be doing decently well on the left there is nothing open on the right at all and I am pleased in that regard. On the left side I do think that she still has some areas of weeping I believe the compression wrap is still the best way to go although I am hopeful that we are headed in the right direction as far as getting this area to heal and silver. Upon inspection patient's wound bed actually showed signs of good granulation  epithelization at this point. There does not appear to be any evidence of active infection which is great news and overall I am extremely pleased with where things stand. 11/04/2020 upon evaluation today patient appears to be doing well with regard to her left leg. In fact this is getting very close to complete granulation and epithelization at this point I am very pleased with where we stand. I do not see any signs of infection currently. 11/18/2020 upon evaluation today patient appears to be doing well in regard to her wound. She is in fact doing extremely well considering the fact we did need to see her  last week she was apparently sick and missed her appointment. She is therefore had the wrap on for 2 weeks. Fortunately there is no signs of active infection at this time. No fevers, chills, nausea, vomiting, or diarrhea. 11/28/2020 upon evaluation today patient appears to be doing well with regard to her legs. Things are healing nicely and overall I do not see any signs of infection at this time which is great news. No fevers, chills, nausea, vomiting, or diarrhea. With that being said she still has a couple areas that are open and weeping currently. 12/10/2020 upon evaluation today patient appears to be doing well with regard to her wound. She has been tolerating the dressing changes without complication. Fortunately there does not appear to be any signs of active infection at this time. No fevers, chills, nausea, vomiting, or diarrhea. 10/11; patient comes in today with really excellent edema control. Everything appears to be closed in clinic occluding the left lateral. The area on the right anterior has some eschar on the top I did not remove this. She has dry scaly skin which might benefit from ammonium lactate but for now I just discharged her with a moisturizer. The patient is also apparently fractured her right great toe. 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 There was a revision of the site on 03/13/2021. She has been using gauze to the wound site.She reports pain to the medial aspect with drainage. Home health was changing the dressing however wound care visits have expired. She is trying to do the dressing change  on her own but reports having trouble with this. She currently denies fever/chills. Electronic Signature(s) Signed: 05/07/2021 2:22:40 PM By: Kalman Shan DO Entered By: Kalman Shan on 05/07/2021 14:09:21 Hiegel, [name] ([id number]) -------------------------------------------------------------------------------- Physical Exam Details Patient Name: [name] Date of Service: 05/07/2021 12:45 PM Medical Record Number: [id number] Patient Account Number: [id number] Date of Birth/Sex: [birth year] (55 y.o. F) Treating RN: Levora Dredge Primary Care Provider: Laurance Flatten Other Clinician: Referring Provider: Laurance Flatten Treating Provider/Extender: Yaakov Guthrie in Treatment: 0 Constitutional . Psychiatric . Notes Right foot: To the great toe amputation site there is dehiscence. There is nonviable tissue at the opening. To the medial aspect there are 2 small openings with yellow thick drainage. There is erythema to the medial aspect along with tenderness on palpation. Electronic Signature(s) Signed: 05/07/2021 2:22:40 PM By: Kalman Shan DO Entered By: Kalman Shan on 05/07/2021 14:10:37 Haws, [name] ([id number]) -------------------------------------------------------------------------------- Physician Orders Details Patient Name: [name]  Date of Service: 05/07/2021 12:45 PM Medical Record Number: [id number] Patient Account Number: [id number] Date of Birth/Sex: 02-24-1966 (56 y.o. F) Treating RN: Levora Dredge Primary Care Provider: Laurance Flatten Other Clinician: Referring Provider: Laurance Flatten Treating Provider/Extender: Yaakov Guthrie in Treatment: 0 Verbal / Phone Orders: No Diagnosis Coding ICD-10 Coding Code Description E11.621 Type 2 diabetes mellitus with foot ulcer T81.30XA Disruption of wound, unspecified, initial encounter L97.512 Non-pressure chronic ulcer of other part of right foot with fat layer exposed Follow-up  Appointments o Return Appointment in 1 week. o Nurse Visit as needed [place] for wound care. May utilize formulary equivalent dressing for wound treatment orders unless otherwise specified. Home Health Nurse may visit PRN to address patientos wound care needs. o **Please direct any NON-WOUND related issues/requests for orders to patient's Primary Care Physician. **If current dressing causes regression in wound condition, may D/C ordered dressing product/s and apply Normal Saline Moist Dressing daily until next Pump Back or Other MD appointment. **Notify [facility] of regression in wound condition at [phone]. o Other Home Health Orders/Instructions: - please change dressing 3 times a week Bathing/ Shower/ Hygiene o Wash wounds with antibacterial soap and water. o May shower with wound dressing protected with water repellent cover or cast protector. o No tub bath. Off-Loading o Other: - Try your best to stay off wound as best as you can Medications-Please add to medication list. o P.O. Antibiotics - PIck up and take as directed Wound Treatment Wound #10 - Foot Wound Laterality: Plantar, Right Primary Dressing: Drawtex Hydroconductive Wound Dressing, 3.5x12 (in/in) 1 x Per Day/30 Days Discharge Instructions: placed on plantar part of foot for drainage Secondary Dressing: Conforming Guaze Roll-Medium 1 x Per Day/30 Days Discharge Instructions: Apply Conforming Stretch Guaze Bandage as directed Secondary Dressing: Gauze 1 x Per Day/30 Days Discharge Instructions: dry Secured With: 8M Medipore H Soft Cloth Surgical Tape, 2x2 (in/yd) 1 x Per Day/30 Days Wound #9 - Foot Wound Laterality: Right, Midline Primary Dressing: Hydrofera Blue Ready Transfer Foam, 4x5 (in/in) 1 x Per Day/30 Days Discharge Instructions: Apply Hydrofera Blue Ready to wound bed as directed Secondary  Dressing: Conforming Guaze Roll-Medium 1 x Per Day/30 Days Discharge Instructions: Apply Conforming Stretch Guaze Bandage as directed Secondary Dressing: Gauze 1 x Per Day/30 Days Discharge Instructions: dry Lipscomb, Jihan ([id number]) Secured With: 8M Medipore H Soft Cloth Surgical Tape, 2x2 (in/yd) 1 x Per Day/30 Days Radiology o MRI, lower extremity without contrast - right foot Patient Medications Allergies: Tylenol PM, diazepam, latex, simvastatin, ciprofloxacin, levofloxacin, Omnicef, Insulins Notifications Medication Indication Start End Augmentin 05/07/2021 DOSE 1 - oral 875 mg-125 mg tablet - 1 tablet oral BID x 10 days Augmentin 05/07/2021 DOSE 1 - oral 875 mg-125 mg tablet - 1 tablet oral BID x 10 days Electronic Signature(s) Signed: 05/08/2021 2:17:54 PM By: Kalman Shan DO Previous Signature: 05/07/2021 5:33:03 PM Version By: Levora Dredge Previous Signature: 05/08/2021 11:43:05 AM Version By: Kalman Shan DO Previous Signature: 05/07/2021 2:22:40 PM Version By: Kalman Shan DO Previous Signature: 05/07/2021 2:16:41 PM Version By: Kalman Shan DO Entered By: Kalman Shan on 05/08/2021 14:17:53 Kot, Kelcie ([id number]) -------------------------------------------------------------------------------- Prescription 05/07/2021 Patient Name: [name] Provider: Kalman Shan MD Date of Birth: Jun 30, 1965 NPI#: [id number] Sex: F DEA#: Phone #: [phone] License #: [id number] Patient Address: [facility] [place], [place] [address] [address],  [address] [place], [place] [address] [phone] Allergies Tylenol PM; diazepam; latex; simvastatin; ciprofloxacin; levofloxacin; Omnicef; Insulins Medication Medication: Route: Strength: Form: Augmentin 875 mg-125 mg tablet oral 875 mg-125 mg tablet Class: PENICILLIN ANTIBIOTICS Dose: Frequency / Time:  Indication: 1 1 tablet oral BID x 10 days Number of Refills: Number of Units: 0 Generic Substitution: Start Date: End Date: One Time Use: Substitution Permitted 5/36/1443 No Note to Pharmacy: Hand Signature: Date(s): Electronic Signature(s) Signed: 05/08/2021 2:19:28 PM By: Kalman Shan DO Previous Signature: 05/07/2021 5:33:03 PM Version By: Levora Dredge Previous Signature: 05/08/2021 11:43:05 AM Version By: Kalman Shan DO Previous Signature: 05/07/2021 2:22:40 PM Version By: Kalman Shan DO Entered By: Kalman Shan on 05/08/2021 14:17:54 Doughman, Latunya ([id number]) --------------------------------------------------------------------------------  Problem List Details Patient Name: [name] Date of Service: 05/07/2021 12:45 PM Medical Record Number: [id number] Patient Account Number: [id number] Date of Birth/Sex: 04-10-1965 (56 y.o. F) Treating RN: Levora Dredge Primary Care Provider: Laurance Flatten Other Clinician: Referring Provider: Laurance Flatten Treating Provider/Extender: Yaakov Guthrie in Treatment: 0 Active Problems ICD-10 Encounter Code Description Active Date MDM Diagnosis E11.621 Type 2 diabetes mellitus with foot ulcer 05/07/2021 No Yes T81.30XA Disruption of wound, unspecified, initial encounter 05/07/2021 No Yes L97.512 Non-pressure chronic ulcer of other part of right foot with fat layer 05/07/2021 No Yes exposed M86.9 Osteomyelitis, unspecified 05/07/2021 No Yes Inactive Problems Resolved Problems Electronic Signature(s) Signed: 05/07/2021 2:22:40 PM By: Kalman Shan DO Entered By: Kalman Shan on 05/07/2021 14:03:52 Stehr, Chimere ([id number]) -------------------------------------------------------------------------------- Progress Note Details Patient Name: [name] Date of Service: 05/07/2021 12:45 PM Medical Record Number: [id number] Patient Account Number: [id number] Date of Birth/Sex: February 06, 1966 (55 y.o.  F) Treating RN: Levora Dredge Primary Care Provider: Laurance Flatten Other Clinician: Referring Provider: Laurance Flatten Treating Provider/Extender: Yaakov Guthrie in Treatment: 0 Subjective Chief Complaint Information obtained from Patient 05/07/2021; surgical wound dehiscence to the right great toe amputation site. History of Present Illness (HPI) 10/24/2018 on evaluation today patient presents for initial evaluation or clinic concerning issues that she has been having with lymphedema for quite some time. Unfortunately she has several wound openings at this point that secondary to her lymphedema/venous stasis are giving her trouble and leaking quite severely. She also has diabetes along with hypertension and stage III chronic kidney disease. Fortunately there is no signs of active infection at this time. She is going to likely require debridement of the left leg ulcer upon evaluation today just based on what I am seeing. Fortunately there is no wound opening on the right. Overall the patient seems to be doing quite well and again there is no evidence of systemic infection which is good news. No fevers, chills, nausea, vomiting, or diarrhea. 10/31/2018 on evaluation today patient actually appears to be doing excellent in regard to her lower extremity ulcers. She has been tolerating the dressing changes without complication. Fortunately there is no signs of active infection at this time. She has tolerated 3 layer compression wrap without complication. 11/07/2018 upon evaluation today patient actually appears to be doing very well with regard to her left lower extremity ulcers. She has been tolerating the dressing changes without complication. Fortunately there is no signs of active infection. No fevers, chills, nausea, vomiting, or diarrhea. 11/21/2018 upon evaluation today patient appears to be doing quite well with regard to her lower extremity ulcers. In fact both areas seem to be showing  signs of good improvement which is excellent. She is not having as much pain as she has in the past and  again has a lot of healing compared to previous visits as well. 12/05/2018 on evaluation today patient presents for follow-up concerning her ongoing issues with her bilateral lower extremity ulcers. The good news is her right lower extremity is showing signs of completely healing at this time which is great news. Fortunately there is no evidence of active infection. On the left she has just a very small area still remaining that is open at this time all in all she is very close to complete closure in my opinion. She does have compression stockings to wear at home. 12/12/2018 on evaluation today patient appears to be doing excellent in regard to her left lateral lower extremity ulcer. She has been tolerating the dressing changes without complication. Fortunately there is no signs of active infection at this time. In fact this appears to be pretty much healed at this point although again she is not 100% today. No fevers, chills, nausea, vomiting, or diarrhea. 12/19/2018 on evaluation today patient actually appears to be doing excellent with regard to her lower extremity ulcer in fact this appears to be completely healed today which is all some. She has done extremely well with wound care measures. No fevers, chills, nausea, vomiting, or diarrhea. ------------------------------------ 11/01/19-Readmission to the clinic Patient presents with left leg pain, wounds posterior calf, onset about 4 weeks, denies any fevers chills or shakes, has not been using anything to these wounds, was given compression stockings at last discharge from the clinic but has not been able to use them as they rolled down and cause creases Patient's history significant for type 2 diabetes insulin requiring, A1c of 6.9 lately, hypertension, chronic pain 8/18; patient readmitted that the clinic last week. She has wounds on her left  lateral posterior lower leg all of this in close juxtaposition i.e. a localized site. We've been using silver alginate under 3 layer compression apparently the wound surface area is better. She was wearing compression stockings from [facility] but says they were falling down. As she progresses more towards healing will need to address what we use in terms of compression stockings perhaps external compression garments 11/17/2019 on evaluation today patient appears to be doing well with regard to her legs currently. She does have 2 wounds which are actually measuring much better the more posterior is actually doing very well which I am pleased with the other though smaller is not quite a small but nonetheless on the lateral aspect does seem to be improving. 11/23/2019 on evaluation today patient appears to be doing well in regard to her wounds. In fact the wound on the posterior aspect of her leg appears healed the lateral aspect is still open but extremely small. 9/15; this is a patient with chronic venous insufficiency and secondary lymphedema. She did not keep her clinic appointment last week and hence the left leg is a lot more swollen since she had to take the wrap off at some point. She has a small weeping area on the left lateral lower leg. She has probably a juxta lite stocking for the left leg I have asked her to bring that in when she comes to her clinic appointment next week at which time she should be healed 10/6; this is a patient with chronic venous insufficiency and secondary lymphedema left greater than right. Skin changes of chronic lymphedema especially in the left leg. She has not been here in 3 weeks. She took the wrap off 2 weeks ago. She has some very old 20/30 stockings  from elastic therapy in [place] she has been using. Fortunately her leg is closed. She also has a single [place] wrap for the left leg Readmission: 06/18/2020 upon evaluation today patient presents for  reevaluation here in the clinic. She is a long-term patient that we have seen intermittently Eddie, Koa ([id number]) when she has had flareups of issues with her lower extremity secondary to chronic venous stasis. With that being said unfortunately today she is having a significant issue with a wound which is actually quite significant over the right lower extremity. She did see dermatology they put Vaseline followed by [place] dressings on her unfortunately she is continuing to have significant issues however here with pain and in fact this wrap was extremely stuck to her upon evaluation today. Nonetheless I believe that she is going to require a little bit different approach to try to keep things from sticking and hopefully aid in getting this to heal more effectively and quickly. With that being said she does have a history of chronic venous insufficiency, lymphedema, diabetes mellitus type 2, chronic kidney disease stage III, hypertension, and congestive heart failure. This started as an injury in mid February and has worsened over the past 2 weeks. 07/04/2020 upon evaluation today patient appears to be doing well with regard to her wound on the leg. Overall I am extremely pleased with where things stand and I do think that she is making excellent progress. There is no sign of infection right now which is also great news. I think that she is close to complete resolution. 07/09/20 upon evaluation today patient appears to be doing excellent in regard to her leg ulcer. She has been tolerating the dressing changes without complication and in general I am extremely pleased with where things stand today. There does not appear to be any signs of active infection at this time which is great news. 07/25/2020 upon evaluation today patient appears to be doing well with regard to her wound. She has been tolerating the dressing changes without complication. Fortunately there is no signs of active  infection at this time and her wound appears to be completely healed. Readmission: 09/30/2020 upon evaluation today patient appears for reevaluation here in our clinic. She was discharged May 5 that unfortunately has started to have weeping and wounds on the left leg at this point. She tells me the right leg did weep some but nothing as significant as the left. With all that being said at this point the patient states that she figured it was best to come in here and she was having a lot of drainage and it was get all of her sheets and everything. She has been wearing her Velcro wraps sometimes but it does not sound like she is been wearing them all the time which I think is part of the reason why this reopened. She also unfortunately had a fall which was quite significant. This occurred I believe about a week or 2 ago. Otherwise patient's past medical history really has not changed significantly. 10/07/2020 upon evaluation today patient appears to be doing better in regard to her wounds of the lower extremities. Fortunately there does not appear to be any signs of active infection at this time which is great news. I do not see any evidence of anything worsening but again she continues to have significant bilateral stage III lymphedema. This has been an ongoing issue. We actually have seen her this year pretty much from March through May of 2022. Following this  she continued with compression at home following but then unfortunately reopened again. That was on July 11. With that being said this is an ongoing and recurrent issue for which honestly have seen her over the past 2 years pretty much consistently. Nonetheless we have had Velcro compression wraps that she uses at home and despite this she continues to have frequent and recurrent ulcerations. 7/27; patient presents for 1 week follow-up. She has no issues or complaints today. She denies signs of infection. 10/21/2020 upon evaluation today patient's  leg actually appears to be doing decently well on the left there is nothing open on the right at all and I am pleased in that regard. On the left side I do think that she still has some areas of weeping I believe the compression wrap is still the best way to go although I am hopeful that we are headed in the right direction as far as getting this area to heal and silver. Upon inspection patient's wound bed actually showed signs of good granulation epithelization at this point. There does not appear to be any evidence of active infection which is great news and overall I am extremely pleased with where things stand. 11/04/2020 upon evaluation today patient appears to be doing well with regard to her left leg. In fact this is getting very close to complete granulation and epithelization at this point I am very pleased with where we stand. I do not see any signs of infection currently. 11/18/2020 upon evaluation today patient appears to be doing well in regard to her wound. She is in fact doing extremely well considering the fact we did need to see her last week she was apparently sick and missed her appointment. She is therefore had the wrap on for 2 weeks. Fortunately there is no signs of active infection at this time. No fevers, chills, nausea, vomiting, or diarrhea. 11/28/2020 upon evaluation today patient appears to be doing well with regard to her legs. Things are healing nicely and overall I do not see any signs of infection at this time which is great news. No fevers, chills, nausea, vomiting, or diarrhea. With that being said she still has a couple areas that are open and weeping currently. 12/10/2020 upon evaluation today patient appears to be doing well with regard to her wound. She has been tolerating the dressing changes without complication. Fortunately there does not appear to be any signs of active infection at this time. No fevers, chills, nausea, vomiting, or diarrhea. 10/11; patient comes  in today with really excellent edema control. Everything appears to be closed in clinic occluding the left lateral. The area on the right anterior has some eschar on the top I did not remove this. She has dry scaly skin which might benefit from ammonium lactate but for now I just discharged her with a moisturizer. The patient is also apparently fractured her right great toe. 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 There was a revision of the site on 03/13/2021. She has been using gauze to the wound site.She reports pain to the medial aspect with drainage. Home health was changing the  dressing however wound care visits have expired. She is trying to do the dressing change on her own but reports having trouble with this. She currently denies fever/chills. Patient History Information obtained from Patient. Allergies Tylenol PM (Reaction: hives), diazepam, latex (Reaction: itching), simvastatin (Reaction: joint pain), ciprofloxacin (Reaction: anxiety), levofloxacin (Reaction: anxiety), Omnicef (Reaction: anxiety), Insulins Noller, Marna ([id number]) Family History Cancer - Father,Siblings, Diabetes - Mother,Siblings, Heart Disease - Mother,Maternal Grandparents, Hypertension - Mother,Maternal Grandparents, No family history of Hereditary Spherocytosis, Kidney Disease, Lung Disease, Seizures, Stroke, Thyroid Problems, Tuberculosis. Social History Never smoker, Marital Status - Divorced, Alcohol Use - Never, Drug Use - No History, Caffeine Use - Never. Medical History Eyes Denies history of Cataracts, Glaucoma, Optic  Neuritis Ear/Nose/Mouth/Throat Denies history of Chronic sinus problems/congestion, Middle ear problems Hematologic/Lymphatic Denies history of Anemia, Hemophilia, Human Immunodeficiency Virus, Lymphedema, Sickle Cell Disease Respiratory Patient has history of Sleep Apnea Denies history of Aspiration, Asthma, Chronic Obstructive Pulmonary Disease (COPD), Pneumothorax, Tuberculosis Cardiovascular Patient has history of Congestive Heart Failure, Coronary Artery Disease - CABG 2010, Hypertension, Myocardial Infarction - November 2021, Peripheral Venous Disease Denies history of Angina, Arrhythmia, Deep Vein Thrombosis, Hypotension, Peripheral Arterial Disease, Phlebitis, Vasculitis Gastrointestinal Denies history of Cirrhosis , Colitis, Crohn s, Hepatitis A, Hepatitis B, Hepatitis C Endocrine Patient has history of Type II Diabetes Denies history of Type I Diabetes Genitourinary Patient has history of 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 Immunological Denies history of Lupus Erythematosus, Raynaud s, Scleroderma Integumentary (Skin) Denies history of History of Burn, History of pressure wounds Musculoskeletal Patient has history of Gout Denies history of Rheumatoid Arthritis, Osteoarthritis, Osteomyelitis Neurologic Patient has history of Neuropathy Denies history of Dementia, Quadriplegia, Paraplegia, Seizure Disorder Oncologic Denies history of Received Chemotherapy, Received Radiation Psychiatric Denies history of Anorexia/bulimia, Confinement Anxiety Patient is treated with Insulin. Blood sugar is tested. Blood sugar results noted at the following times: Breakfast - 130. Hospitalization/Surgery History - c section. - hernia. - cabg. Medical And Surgical History Notes Cardiovascular HLD Review of Systems (ROS) Constitutional Symptoms (General Health) Denies complaints or symptoms of Fatigue, Fever, Chills, Marked Weight Change. Eyes Complains or has symptoms of Glasses /  Contacts. Respiratory Complains or has symptoms of Shortness of Breath. Gastrointestinal Denies complaints or symptoms of Frequent diarrhea, Nausea, Vomiting. Immunological Denies complaints or symptoms of Hives, Itching. Integumentary (Skin) Complains or has symptoms of Wounds. Musculoskeletal Complains or has symptoms of Muscle Pain. Psychiatric Denies complaints or symptoms of Anxiety, Claustrophobia. Objective Manton, Janeva ([id number]) Constitutional Vitals Time Taken: 12:56 PM, Temperature: 97.5 F, Pulse: 79 bpm, Respiratory Rate: 18 breaths/min, Blood Pressure: 143/79 mmHg. General Notes: Right foot: To the great toe amputation site there is dehiscence. There is nonviable tissue at the opening. To the medial aspect there are 2 small openings with yellow thick drainage. There is erythema to the medial aspect along with tenderness on palpation. Integumentary (Hair, Skin) Wound #10 status is Open. Original cause of wound was Surgical Injury. The date acquired was: 03/13/2021. The wound is located on the [place]. The wound measures 2cm length x 2.3cm width x 0.1cm depth; 3.613cm^2 area and 0.361cm^3 volume. There is Fat Layer (Subcutaneous Tissue) exposed. There is no tunneling or undermining noted. There is a medium amount of serosanguineous drainage noted. There is small (1-33%) pink, pale granulation within the wound bed. There is a large (67-100%) amount of necrotic tissue within the wound bed including Adherent Slough. Wound #9 status is Open. Original cause of wound was Surgical Injury. The  date acquired was: 03/13/2021. The wound is located on the [place]. The wound measures 1.5cm length x 4.5cm width x 0.5cm depth; 5.301cm^2 area and 2.651cm^3 volume. There is Fat Layer (Subcutaneous Tissue) exposed. There is no tunneling or undermining noted. There is a medium amount of serosanguineous drainage noted. There is small (1-33%) pink granulation within the  wound bed. There is a large (67-100%) amount of necrotic tissue within the wound bed including Adherent Slough. Assessment Active Problems ICD-10 Type 2 diabetes mellitus with foot ulcer Disruption of wound, unspecified, initial encounter Non-pressure chronic ulcer of other part of right foot with fat layer exposed Osteomyelitis, unspecified Patient presents with a 53-month history of nonhealing surgical site wound. She had an amputation of the right great toe due to osteomyelitis on 01/31/2021 and had to have revision of this area the next month. Today the foot appears infected. She is taking doxycycline currently and I will add Augmentin to this. I also recommended an MRI of the foot. I recommended aggressive offloading and to use Hydrofera Blue to the wound bed. She is at high risk for losing her limb. She states she has had ABIs done in the past at [facility] and we will ask for these records. Pedal pulses were hard to palpate. Follow-up in 1 week. Plan Follow-up Appointments: Return Appointment in 1 week. Nurse Visit as needed Home Health: [place]: - [place] for wound care. May utilize formulary equivalent dressing for wound treatment orders unless otherwise specified. Home Health Nurse may visit PRN to address patient s wound care needs. **Please direct any NON-WOUND related issues/requests for orders to patient's Primary Care Physician. **If current dressing causes regression in wound condition, may D/C ordered dressing product/s and apply Normal Saline Moist Dressing daily until next [place] or Other MD appointment. **Notify [facility] of regression in wound condition at [phone]. Other Home Health Orders/Instructions: - please change dressing 3 times a week Bathing/ Shower/ Hygiene: Wash wounds with antibacterial soap and water. May shower with wound dressing protected with water repellent cover or cast protector. No  tub bath. Off-Loading: Other: - Try your best to stay off wound as best as you can Medications-Please add to medication list.: P.O. Antibiotics - PIck up and take as directed Radiology ordered were: MRI, lower extremity without contrast - right foot The following medication(s) was prescribed: Augmentin oral 875 mg-125 mg tablet 1 1 tablet oral BID x 10 days starting 05/07/2021 Augmentin oral 875 mg-125 mg tablet 1 1 tablet oral BID x 10 days starting 05/07/2021 WOUND #10: - Foot Wound Laterality: Plantar, Right Primary Dressing: Drawtex Hydroconductive Wound Dressing, 3.5x12 (in/in) 1 x Per Day/30 Days Discharge Instructions: placed on plantar part of foot for drainage Secondary Dressing: Conforming Guaze Roll-Medium 1 x Per Day/30 Days Ehrlich, Chiquitta ([id number]) Discharge Instructions: Apply Conforming Stretch Guaze Bandage as directed Secondary Dressing: Gauze 1 x Per Day/30 Days Discharge Instructions: dry Secured With: 32M Medipore H Soft Cloth Surgical Tape, 2x2 (in/yd) 1 x Per Day/30 Days WOUND #9: - Foot Wound Laterality: Right, Midline Primary Dressing: Hydrofera Blue Ready Transfer Foam, 4x5 (in/in) 1 x Per Day/30 Days Discharge Instructions: Apply Hydrofera Blue Ready to wound bed as directed Secondary Dressing: Conforming Guaze Roll-Medium 1 x Per Day/30 Days Discharge Instructions: Apply Conforming Stretch Guaze Bandage as directed Secondary Dressing: Gauze 1 x Per Day/30 Days Discharge Instructions: dry Secured With: 32M Medipore H Soft Cloth Surgical Tape, 2x2 (in/yd) 1 x Per  Day/30 Days 1. Augmentin 2. Aggressive offloading 3. MRI without contrast of the right foot 4. Follow-up in one 1 week Electronic Signature(s) Signed: 05/14/2021 1:37:53 PM By: Kalman Shan DO Previous Signature: 05/07/2021 2:22:40 PM Version By: Kalman Shan DO Entered By: Kalman Shan on 05/14/2021 13:37:29 Alderete, Oaklynn  ([id number]) -------------------------------------------------------------------------------- ROS/PFSH Details Patient Name: [name] Date of Service: 05/07/2021 12:45 PM Medical Record Number: [id number] Patient Account Number: [id number] Date of Birth/Sex: 10/04/1965 (55 y.o. F) Treating RN: Levora Dredge Primary Care Provider: Laurance Flatten Other Clinician: Referring Provider: Laurance Flatten Treating Provider/Extender: Yaakov Guthrie in Treatment: 0 Information Obtained From Patient Constitutional Symptoms (General Health) Complaints and Symptoms: Negative for: Fatigue; Fever; Chills; Marked Weight Change Eyes Complaints and Symptoms: Positive for: Glasses / Contacts Medical History: Negative for: Cataracts; Glaucoma; Optic Neuritis Respiratory Complaints and Symptoms: Positive for: Shortness of Breath Medical History: Positive for: Sleep Apnea Negative for: Aspiration; Asthma; Chronic Obstructive Pulmonary Disease (COPD); Pneumothorax; Tuberculosis Gastrointestinal Complaints and Symptoms: Negative for: Frequent diarrhea; Nausea; Vomiting Medical History: Negative for: Cirrhosis ; Colitis; Crohnos; Hepatitis A; Hepatitis B; Hepatitis C Immunological Complaints and Symptoms: Negative for: Hives; Itching Medical History: Negative for: Lupus Erythematosus; Raynaudos; Scleroderma Integumentary (Skin) Complaints and Symptoms: Positive for: Wounds Medical History: Negative for: History of Burn; History of pressure wounds Musculoskeletal Complaints and Symptoms: Positive for: Muscle Pain Medical History: Positive for: Gout Negative for: Rheumatoid Arthritis; Osteoarthritis; Osteomyelitis Psychiatric Klutts, Kenny ([id number]) Complaints and Symptoms: Negative for: Anxiety; Claustrophobia Medical History: Negative for: Anorexia/bulimia; Confinement Anxiety Ear/Nose/Mouth/Throat Medical History: Negative for: Chronic sinus problems/congestion; Middle ear  problems Hematologic/Lymphatic Medical History: Negative for: Anemia; Hemophilia; Human Immunodeficiency Virus; Lymphedema; Sickle Cell Disease Cardiovascular Medical History: Positive for: Congestive Heart Failure; Coronary Artery Disease - CABG 2010; Hypertension; Myocardial Infarction - November 2021; Peripheral Venous Disease Negative for: Angina; Arrhythmia; Deep Vein Thrombosis; Hypotension; Peripheral Arterial Disease; Phlebitis; Vasculitis Past Medical History Notes: HLD Endocrine Medical History: Positive for: Type II Diabetes Negative for: Type I Diabetes Time with diabetes: 1997 Treated with: Insulin Blood sugar tested every day: Yes Tested : 3 x daily Blood sugar testing results: Breakfast: 130 Genitourinary Medical History: Positive for: 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 Neurologic Medical History: Positive for: Neuropathy Negative for: Dementia; Quadriplegia; Paraplegia; Seizure Disorder Oncologic Medical History: Negative for: Received Chemotherapy; Received Radiation Immunizations Pneumococcal Vaccine: Received Pneumococcal Vaccination: Yes Received Pneumococcal Vaccination On or After 60th Birthday: No Implantable Devices None Hospitalization / Surgery History Type of Hospitalization/Surgery c section hernia cabg Family and Social History NIMAH, UPHOFF ([id number]) Cancer: Yes - Father,Siblings; Diabetes: Yes - Mother,Siblings; Heart Disease: Yes - Mother,Maternal Grandparents; Hereditary Spherocytosis: No; Hypertension: Yes - Mother,Maternal Grandparents; Kidney Disease: No; Lung Disease: No; Seizures: No; Stroke: No; Thyroid Problems: No; Tuberculosis: No; Never smoker; Marital Status - Divorced; Alcohol Use: Never; Drug Use: No History; Caffeine Use: Never; Financial Concerns: No; Food, Clothing or Shelter Needs: No; Support System Lacking: No; Transportation Concerns: No Electronic Signature(s) Signed: 05/07/2021 2:22:40 PM By: Kalman Shan DO Signed: 05/07/2021 5:33:03 PM By: Levora Dredge Entered By: Levora Dredge on 05/07/2021 13:04:28 Partin, [name] ([id number]) -------------------------------------------------------------------------------- SuperBill Details Patient Name: [name] Date of Service: 05/07/2021 Medical Record Number: [id number] Patient Account Number: [id number] Date of Birth/Sex: [birth year] (56 y.o. F) Treating RN: Levora Dredge Primary Care Provider: Laurance Flatten Other Clinician: Referring Provider: Laurance Flatten Treating Provider/Extender: Yaakov Guthrie in Treatment: 0 Diagnosis Coding ICD-10 Codes Code Description E11.621 Type 2 diabetes mellitus with foot ulcer T81.30XA Disruption of  wound, unspecified, initial encounter L97.512 Non-pressure chronic ulcer of other part of right foot with fat layer exposed M86.9 Osteomyelitis, unspecified L03.115 Cellulitis of right lower limb Facility Procedures CPT4 Code: [id number] Description: 99214 - WOUND CARE VISIT-LEV 4 EST PT Modifier: Quantity: 1 Physician Procedures CPT4 Code: 4784128 Description: 20813 - WC PHYS LEVEL 4 - EST PT Modifier: Quantity: 1 CPT4 Code: Description: ICD-10 Diagnosis Description L97.512 Non-pressure chronic ulcer of other part of right foot with fat layer e E11.621 Type 2 diabetes mellitus with foot ulcer T81.30XA Disruption of wound, unspecified, initial encounter L03.115 Cellulitis of  right lower limb Modifier: xposed Quantity: Electronic Signature(s) Signed: 05/07/2021 5:31:32 PM By: Levora Dredge Signed: 05/08/2021 11:43:05 AM By: Kalman Shan DO Previous Signature: 05/07/2021 2:22:40 PM Version By: Kalman Shan DO Entered By: Levora Dredge on 05/07/2021 17:31:31

## 2021-05-11 ENCOUNTER — Other Ambulatory Visit (HOSPITAL_COMMUNITY): Payer: Self-pay | Admitting: Cardiology

## 2021-05-14 ENCOUNTER — Encounter (HOSPITAL_BASED_OUTPATIENT_CLINIC_OR_DEPARTMENT_OTHER): Payer: Medicare Other | Admitting: Internal Medicine

## 2021-05-14 ENCOUNTER — Other Ambulatory Visit: Payer: Self-pay

## 2021-05-14 ENCOUNTER — Other Ambulatory Visit (HOSPITAL_BASED_OUTPATIENT_CLINIC_OR_DEPARTMENT_OTHER): Payer: Self-pay | Admitting: Internal Medicine

## 2021-05-14 DIAGNOSIS — M869 Osteomyelitis, unspecified: Secondary | ICD-10-CM | POA: Diagnosis not present

## 2021-05-14 DIAGNOSIS — E11621 Type 2 diabetes mellitus with foot ulcer: Secondary | ICD-10-CM

## 2021-05-14 DIAGNOSIS — L97512 Non-pressure chronic ulcer of other part of right foot with fat layer exposed: Secondary | ICD-10-CM

## 2021-05-14 DIAGNOSIS — T8130XA Disruption of wound, unspecified, initial encounter: Secondary | ICD-10-CM | POA: Diagnosis not present

## 2021-05-14 NOTE — Progress Notes (Signed)
JACI, DESANTO ([id number]) Visit Report for 05/14/2021 Arrival Information Details Patient Name: [name], [name] Date of Service: 05/14/2021 1:15 PM Medical Record Number: [id number] Patient Account Number: [id number] Date of Birth/Sex: 1966/02/22 (56 y.o. F) Treating RN: Carlene Coria Primary Care Rosy Estabrook: Laurance Flatten Other Clinician: Referring Latif Nazareno: Laurance Flatten Treating Garlan Drewes/Extender: Yaakov Guthrie in Treatment: 1 Visit Information History Since Last Visit All ordered tests and consults were completed: No Patient Arrived: [name] Added or deleted any medications: No Arrival Time: 13:10 Any new allergies or adverse reactions: No Accompanied By: self Had a fall or experienced change in No Transfer Assistance: None activities of daily living that may affect Patient Identification Verified: Yes risk of falls: Secondary Verification Process Completed: Yes Signs or symptoms of abuse/neglect since last visito No Patient Requires Transmission-Based Precautions: No Hospitalized since last visit: No Patient Has Alerts: Yes Implantable device outside of the clinic excluding No Patient Alerts: diabetic cellular tissue based products placed in the center since last visit: Has Dressing in Place as Prescribed: Yes Pain Present Now: Yes Electronic Signature(s) Signed: 05/14/2021 3:39:51 PM By: Carlene Coria RN Entered By: Carlene Coria on 05/14/2021 13:25:38 [name], [name] ([id number]) -------------------------------------------------------------------------------- Clinic Level of Care Assessment Details Patient Name: [name] Date of Service: 05/14/2021 1:15 PM Medical Record Number: [id number] Patient Account Number: [id number] Date of Birth/Sex: [birth year] (56 y.o. F) Treating RN: Carlene Coria Primary Care Kadence Mikkelson: Laurance Flatten Other Clinician: Referring Zayley Arras: Laurance Flatten Treating Leasia Swann/Extender: Yaakov Guthrie in Treatment: 1 Clinic Level of Care  Assessment Items TOOL 4 Quantity Score X - Use when only an EandM is performed on FOLLOW-UP visit 1 0 ASSESSMENTS - Nursing Assessment / Reassessment X - Reassessment of Co-morbidities (includes updates in patient status) 1 10 X- 1 5 Reassessment of Adherence to Treatment Plan ASSESSMENTS - Wound and Skin Assessment / Reassessment []  - Simple Wound Assessment / Reassessment - one wound 0 X- 2 5 Complex Wound Assessment / Reassessment - multiple wounds []  - 0 Dermatologic / Skin Assessment (not related to wound area) ASSESSMENTS - Focused Assessment []  - Circumferential Edema Measurements - multi extremities 0 []  - 0 Nutritional Assessment / Counseling / Intervention []  - 0 Lower Extremity Assessment (monofilament, tuning fork, pulses) []  - 0 Peripheral Arterial Disease Assessment (using hand held doppler) ASSESSMENTS - Ostomy and/or Continence Assessment and Care []  - Incontinence Assessment and Management 0 []  - 0 Ostomy Care Assessment and Management (repouching, etc.) PROCESS - Coordination of Care []  - Simple Patient / Family Education for ongoing care 0 []  - 0 Complex (extensive) Patient / Family Education for ongoing care []  - 0 Staff obtains Programmer, systems, Records, Test Results / Process Orders []  - 0 Staff telephones HHA, Nursing Homes / Clarify orders / etc []  - 0 Routine Transfer to another Facility (non-emergent condition) []  - 0 Routine Hospital Admission (non-emergent condition) []  - 0 New Admissions / Biomedical engineer / Ordering NPWT, Apligraf, etc. []  - 0 Emergency Hospital Admission (emergent condition) X- 1 10 Simple Discharge Coordination []  - 0 Complex (extensive) Discharge Coordination PROCESS - Special Needs []  - Pediatric / Minor Patient Management 0 []  - 0 Isolation Patient Management []  - 0 Hearing / Language / Visual special needs []  - 0 Assessment of Community assistance (transportation, D/C planning, etc.) []  - 0 Additional  assistance / Altered mentation []  - 0 Support Surface(s) Assessment (bed, cushion, seat, etc.) INTERVENTIONS - Wound Cleansing / Measurement [name], [name] ([id number]) []  - 0 Simple Wound Cleansing - one wound X- 2  5 Complex Wound Cleansing - multiple wounds X- 1 5 Wound Imaging (photographs - any number of wounds) []  - 0 Wound Tracing (instead of photographs) []  - 0 Simple Wound Measurement - one wound X- 2 5 Complex Wound Measurement - multiple wounds INTERVENTIONS - Wound Dressings X - Small Wound Dressing one or multiple wounds 1 10 X- 1 15 Medium Wound Dressing one or multiple wounds []  - 0 Large Wound Dressing one or multiple wounds []  - 0 Application of Medications - topical []  - 0 Application of Medications - injection INTERVENTIONS - Miscellaneous []  - External ear exam 0 []  - 0 Specimen Collection (cultures, biopsies, blood, body fluids, etc.) []  - 0 Specimen(s) / Culture(s) sent or taken to Lab for analysis []  - 0 Patient Transfer (multiple staff / Civil Service fast streamer / Similar devices) []  - 0 Simple Staple / Suture removal (25 or less) []  - 0 Complex Staple / Suture removal (26 or more) []  - 0 Hypo / Hyperglycemic Management (close monitor of Blood Glucose) []  - 0 Ankle / Brachial Index (ABI) - do not check if billed separately X- 1 5 Vital Signs Has the patient been seen at the hospital within the last three years: Yes Total Score: 90 Level Of Care: New/Established - Level 3 Electronic Signature(s) Signed: 05/14/2021 3:39:51 PM By: Carlene Coria RN Entered By: Carlene Coria on 05/14/2021 13:46:10 [name], [name] ([id number]) -------------------------------------------------------------------------------- Encounter Discharge Information Details Patient Name: [name] Date of Service: 05/14/2021 1:15 PM Medical Record Number: [id number] Patient Account Number: [id number] Date of Birth/Sex: [birth year] (56 y.o. F) Treating RN: Carlene Coria Primary Care  Dene Landsberg: Laurance Flatten Other Clinician: Referring Kwan Shellhammer: Laurance Flatten Treating Breslin Hemann/Extender: Yaakov Guthrie in Treatment: 1 Encounter Discharge Information Items Discharge Condition: Stable Ambulatory Status: Cane Discharge Destination: Home Transportation: Private Auto Accompanied By: self Schedule Follow-up Appointment: Yes Clinical Summary of Care: Patient Declined Electronic Signature(s) Signed: 05/14/2021 3:39:51 PM By: Carlene Coria RN Entered By: Carlene Coria on 05/14/2021 13:49:17 [name], [name] ([id number]) -------------------------------------------------------------------------------- Lower Extremity Assessment Details Patient Name: MIRI, JOSE Date of Service: 05/14/2021 1:15 PM Medical Record Number: [id number] Patient Account Number: [id number] Date of Birth/Sex: 10/12/1965 (56 y.o. F) Treating RN: Carlene Coria Primary Care Maythe Deramo: Laurance Flatten Other Clinician: Referring Ellamarie Naeve: Laurance Flatten Treating Charonda Hefter/Extender: Yaakov Guthrie in Treatment: 1 Edema Assessment Assessed: [Left: No] [Right: No] Edema: [Left: Ye] [Right: s] Calf Left: Right: Point of Measurement: 35 cm From Medial Instep 43.5 cm Ankle Left: Right: Point of Measurement: 10 cm From Medial Instep 25 cm Vascular Assessment Pulses: Dorsalis Pedis Palpable: [Left:Yes] Electronic Signature(s) Signed: 05/14/2021 3:39:51 PM By: Carlene Coria RN Entered By: Carlene Coria on 05/14/2021 13:37:39 [name], [name] ([id number]) -------------------------------------------------------------------------------- Multi Wound Chart Details Patient Name: [name] Date of Service: 05/14/2021 1:15 PM Medical Record Number: [id number] Patient Account Number: [id number] Date of Birth/Sex: 07-Aug-1965 (56 y.o. 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 Surgical Wound N/A Extremity Comorbid History: Sleep Apnea, Congestive Heart Sleep Apnea, Congestive Heart N/A Failure, Coronary Artery Disease, Failure, Coronary Artery Disease, Hypertension, Myocardial Infarction, Hypertension, Myocardial Infarction, Peripheral Venous Disease, Type II Peripheral Venous Disease, Type II Diabetes, End Stage Renal  Disease, Diabetes, End Stage Renal Disease, Gout, Neuropathy Gout, Neuropathy Date Acquired: 03/13/2021 03/13/2021 N/A Weeks of Treatment: 1 1 N/A Wound Status: Open Open N/A Wound Recurrence: No No N/A Clustered Wound: Yes No N/A Clustered Quantity: 2 N/A N/A Pending Amputation on Yes Yes N/A Presentation: Measurements L x W x D (cm) 0.4x0.3x0.2 1.5x5x1.5 N/A Area (cm) : 0.094 5.89 N/A Volume (cm) : 0.019 8.836 N/A % Reduction in Area: 97.40% -11.10% N/A % Reduction in Volume: 94.70% -233.30% N/A Classification: Grade 2 Unclassifiable N/A Exudate Amount: Medium Medium N/A Exudate Type: Serosanguineous Serosanguineous N/A Exudate Color: red, brown red, brown N/A Granulation Amount: Small (1-33%) None Present (0%) N/A Necrotic Amount: Large (67-100%) Large (67-100%) N/A Exposed Structures: Fat Layer (Subcutaneous Tissue): Fat Layer (Subcutaneous Tissue): N/A Yes No Epithelialization: None None N/A Treatment Notes Electronic Signature(s) Signed: 05/14/2021 3:39:51 PM By: Carlene Coria RN Entered By: Carlene Coria on 05/14/2021 13:43:47 [name], [name] ([id number]) [name], [name]  ([id number]) -------------------------------------------------------------------------------- Multi-Disciplinary Care Plan Details Patient Name: [name] Date of Service: 05/14/2021 1:15 PM Medical Record Number: [id number] Patient Account Number: [id number] Date of Birth/Sex: 11-02-1965 (56 y.o. F) Treating RN: Carlene Coria Primary Care Jaxyn Rout: Laurance Flatten Other Clinician: Referring Zoya Sprecher: Laurance Flatten Treating Shaylah Mcghie/Extender: Yaakov Guthrie in Treatment: 1 Active Inactive Orientation to the Wound Care Program Nursing Diagnoses: Knowledge deficit related to the wound healing center program Goals: Patient/caregiver will verbalize understanding of the Portal Date Initiated: 05/07/2021 Target Resolution Date: 05/07/2021 Goal Status: Active Interventions: Provide education on orientation to the wound center Notes: Wound/Skin Impairment Nursing Diagnoses: Impaired tissue integrity Knowledge deficit related to ulceration/compromised skin integrity Goals: Ulcer/skin breakdown will have a volume reduction of 30% by week 4 Date Initiated: 05/07/2021 Target Resolution Date: 06/04/2021 Goal Status: Active Ulcer/skin breakdown will have a volume reduction of 50% by week 8 Date Initiated: 05/07/2021 Target Resolution Date: 07/02/2021 Goal Status: Active Ulcer/skin breakdown will have a volume reduction of 80% by week 12 Date Initiated: 05/07/2021 Target Resolution Date: 07/30/2021 Goal Status: Active Ulcer/skin breakdown will heal within 14 weeks Date Initiated: 05/07/2021 Target Resolution Date: 08/13/2021 Goal Status: Active Interventions: Assess patient/caregiver ability to obtain necessary supplies Assess patient/caregiver ability to perform ulcer/skin care regimen upon admission and as needed Assess ulceration(s) every visit Provide education on ulcer and skin care Notes: Electronic Signature(s) Signed: 05/14/2021 3:39:51 PM By: Carlene Coria  RN Entered By: Carlene Coria on 05/14/2021 13:43:32 [name], [name] ([id number]) -------------------------------------------------------------------------------- Pain Assessment Details Patient Name: [name] Date of Service: 05/14/2021 1:15 PM Medical Record Number: [id number] Patient Account Number: [id number] Date of Birth/Sex: April 30, 1965 (56 y.o. F) Treating RN: Carlene Coria Primary Care Chaun Uemura: Laurance Flatten Other Clinician: Referring Marlee Armenteros: Laurance Flatten Treating Emaya Preston/Extender: Yaakov Guthrie in Treatment: 1 Active Problems Location of Pain Severity and Description of Pain Patient Has Paino Yes Site Locations With Dressing Change: Yes Duration of the Pain. Constant / Intermittento Intermittent How Long Does it Lasto Hours: Minutes: 15 Rate the pain. Current Pain Level: 5 Worst Pain Level: 6 Least Pain Level: 0 Tolerable Pain Level: 5 Character of Pain Describe the Pain: Aching, Burning Pain Management and Medication Current Pain Management: Medication: Yes Cold Application: No Rest: Yes Massage: No Activity: No T.E.N.S.: No Heat Application: No Leg drop or elevation: No Is the Current Pain Management Adequate: Inadequate How does your wound impact your activities of daily livingo Sleep: Yes Bathing: No Appetite: Yes Relationship With Others: No Bladder Continence: No Emotions: No Bowel Continence: No Work: No Toileting:  No Drive: No Dressing: No Hobbies: No Electronic Signature(s) Signed: 05/14/2021 3:39:51 PM By: Carlene Coria RN Entered By: Carlene Coria on 05/14/2021 13:26:47 [name], [name] ([id number]) -------------------------------------------------------------------------------- Patient/Caregiver Education Details Patient Name: [name] Date of Service: 05/14/2021 1:15 PM Medical Record Number: [id number] Patient Account Number: [id number] Date of Birth/Gender: [birth year] (56 y.o. F) Treating RN: Carlene Coria Primary Care  Physician: Laurance Flatten Other Clinician: Referring Physician: Laurance Flatten Treating Physician/Extender: Yaakov Guthrie in Treatment: 1 Education Assessment Education Provided To: Patient Education Topics Provided Wound/Skin Impairment: Methods: Explain/Verbal Responses: State content correctly Electronic Signature(s) Signed: 05/14/2021 3:39:51 PM By: Carlene Coria RN Entered By: Carlene Coria on 05/14/2021 13:46:30 [name], [name] ([id number]) -------------------------------------------------------------------------------- Wound Assessment Details Patient Name: [name] Date of Service: 05/14/2021 1:15 PM Medical Record Number: [id number] Patient Account Number: [id number] Date of Birth/Sex: October 14, 1965 (56 y.o. F) Treating RN: Carlene Coria Primary Care Brendon Christoffel: Laurance Flatten Other Clinician: Referring Rande Dario: Laurance Flatten Treating Errin Whitelaw/Extender: Yaakov Guthrie in Treatment: 1 Wound Status Wound Number: 10 Primary Diabetic Wound/Ulcer of the Lower Extremity Etiology: Wound Location: Right, Plantar Foot Wound Open Wounding Event: Surgical Injury Status: Date Acquired: 03/13/2021 Comorbid Sleep Apnea, Congestive Heart Failure, Coronary Artery Weeks Of Treatment: 1 History: Disease, Hypertension, Myocardial Infarction, Peripheral Clustered Wound: Yes Venous Disease, Type II Diabetes, End Stage Renal Pending Amputation On Presentation Disease, Gout, Neuropathy Photos Wound Measurements Length: (cm) 0.4 Width: (cm) 0.3 Depth: (cm) 0.2 Clustered Quantity: 2 Area: (cm) 0.094 Volume: (cm) 0.019 % Reduction in Area: 97.4% % Reduction in Volume: 94.7% Epithelialization: None Tunneling: No Undermining: No Wound Description Classification: Grade 2 Exudate Amount: Medium Exudate Type: Serosanguineous Exudate Color: red, brown Foul Odor After Cleansing: No Slough/Fibrino Yes Wound Bed Granulation Amount: Small (1-33%) Exposed Structure Necrotic  Amount: Large (67-100%) Fat Layer (Subcutaneous Tissue) Exposed: Yes Necrotic Quality: Adherent Slough Treatment Notes Wound #10 (Foot) Wound Laterality: Plantar, Right Cleanser Peri-Wound Care Topical Primary Dressing Drawtex Hydroconductive Wound Dressing, 3.5x12 (in/in) [name], [name] ([id number]) Discharge Instruction: placed on plantar part of foot for drainage Secondary Dressing [place] Roll-Medium Discharge Instruction: [place] as directed Gauze Discharge Instruction: dry Secured With 35M Medipore H Soft Cloth Surgical Tape, 2x2 (in/yd) Compression Wrap Compression Stockings Add-Ons Electronic Signature(s) Signed: 05/14/2021 3:39:51 PM By: Carlene Coria RN Entered By: Carlene Coria on 05/14/2021 13:35:59 Izard, Nataly ([id number]) -------------------------------------------------------------------------------- Wound Assessment Details Patient Name: [name] Date of Service: 05/14/2021 1:15 PM Medical Record Number: [id number] Patient Account Number: [id number] Date of Birth/Sex: 1965-12-25 (56 y.o. F) Treating RN: Carlene Coria Primary Care Stepanie Graver: Laurance Flatten Other Clinician: Referring Haiden Rawlinson: Laurance Flatten Treating Tinsley Lomas/Extender: Yaakov Guthrie in Treatment: 1 Wound Status Wound Number: 9 Primary Open Surgical Wound Etiology: Wound Location: Right, Midline Foot Wound Open Wounding Event: Surgical Injury Status: Date Acquired: 03/13/2021 Notes: amputation of right great toe Weeks Of Treatment: 1 Comorbid Sleep Apnea, Congestive Heart Failure, Coronary Artery Clustered Wound: No History: Disease, Hypertension, Myocardial Infarction, Peripheral Pending Amputation On Presentation Venous Disease, Type II Diabetes, End Stage Renal Disease, Gout, Neuropathy Photos Wound Measurements Length: (cm) 1.5 Width: (cm) 5 Depth: (cm) 1.5 Area: (cm) 5.89 Volume: (cm) 8.836 % Reduction in Area: -11.1% %  Reduction in Volume: -233.3% Epithelialization: None Tunneling: No Undermining: No Wound Description Classification: Unclassifiable F Exudate Amount: Medium S Exudate Type: Serosanguineous Exudate Color: red, brown oul Odor After Cleansing: No lough/Fibrino Yes Wound Bed Granulation Amount: None Present (0%) Exposed Structure Necrotic Amount: Large (67-100%) Fat Layer (Subcutaneous Tissue) Exposed: No Necrotic  Quality: Adherent Slough Treatment Notes Wound #9 (Foot) Wound Laterality: Right, Midline Cleanser Peri-Wound Care Topical Primary Dressing Hydrofera Blue Ready Transfer Foam, 4x5 (in/in) Laughter, Erna ([id number]) Discharge Instruction: Apply Hydrofera Blue Ready to wound bed as directed Secondary Dressing [place] Roll-Medium Discharge Instruction: [place] as directed Gauze Discharge Instruction: dry Secured With 13M Medipore H Soft Cloth Surgical Tape, 2x2 (in/yd) Compression Wrap Compression Stockings Add-Ons Electronic Signature(s) Signed: 05/14/2021 3:39:51 PM By: Carlene Coria RN Entered By: Carlene Coria on 05/14/2021 13:36:53 Ridley, [name] ([id number]) -------------------------------------------------------------------------------- Vitals Details Patient Name: [name] Date of Service: 05/14/2021 1:15 PM Medical Record Number: [id number] Patient Account Number: [id number] Date of Birth/Sex: 1965/12/25 (56 y.o. F) Treating RN: Carlene Coria Primary Care Sheanna Dail: Laurance Flatten Other Clinician: Referring Corinda Ammon: Laurance Flatten Treating Jakyla Reza/Extender: Yaakov Guthrie in Treatment: 1 Vital Signs Time Taken: 13:25 Temperature (F): 99.5 Pulse (bpm): 91 Respiratory Rate (breaths/min): 18 Blood Pressure (mmHg): 135/66 Reference Range: 80 - 120 mg / dl Electronic Signature(s) Signed: 05/14/2021 3:39:51 PM By: Carlene Coria RN Entered By: Carlene Coria on 05/14/2021 13:26:06

## 2021-05-14 NOTE — Progress Notes (Signed)
[name], [name] ([id number]) Visit Report for 05/14/2021 Chief Complaint Document Details Patient Name: [name], [name] Date of Service: 05/14/2021 1:15 PM Medical Record Number: [id number] Patient Account Number: [id number] Date of Birth/Sex: [birth year] (56 y.o. F) Treating RN: Carlene Coria Primary Care Provider: Laurance Flatten Other Clinician: Referring Provider: Laurance Flatten Treating Provider/Extender: Yaakov Guthrie in Treatment: 1 Information Obtained from: Patient Chief Complaint 05/07/2021; surgical wound dehiscence to the right great toe amputation site. Electronic Signature(s) Signed: 05/14/2021 2:22:52 PM By: Kalman Shan DO Entered By: Kalman Shan on 05/14/2021 14:10:17 [name], [name] ([id number]) -------------------------------------------------------------------------------- HPI Details Patient Name: [name] Date of Service: 05/14/2021 1:15 PM Medical Record Number: [id number] Patient Account Number: [id number] Date of Birth/Sex: [birth year] (56 y.o. F) Treating RN: Carlene Coria Primary Care Provider: Laurance Flatten Other Clinician: Referring Provider: Laurance Flatten Treating Provider/Extender: Yaakov Guthrie in Treatment: 1 History of Present Illness HPI Description: 10/24/2018 on evaluation today patient presents for initial evaluation or clinic concerning issues that she has been having with lymphedema for quite some time. Unfortunately she has several wound openings at this point that secondary to her lymphedema/venous stasis are giving her trouble and leaking quite severely. She also has diabetes along with hypertension and stage III chronic kidney disease. Fortunately there is no signs of active infection at this time. She is going to likely require debridement of the left leg ulcer upon evaluation today just based on what I am seeing. Fortunately there is no wound opening on the right. Overall the patient seems to be doing quite well and again there is  no evidence of systemic infection which is good news. No fevers, chills, nausea, vomiting, or diarrhea. 10/31/2018 on evaluation today patient actually appears to be doing excellent in regard to her lower extremity ulcers. She has been tolerating the dressing changes without complication. Fortunately there is no signs of active infection at this time. She has tolerated 3 layer compression wrap without complication. 11/07/2018 upon evaluation today patient actually appears to be doing very well with regard to her left lower extremity ulcers. She has been tolerating the dressing changes without complication. Fortunately there is no signs of active infection. No fevers, chills, nausea, vomiting, or diarrhea. 11/21/2018 upon evaluation today patient appears to be doing quite well with regard to her lower extremity ulcers. In fact both areas seem to be showing signs of good improvement which is excellent. She is not having as much pain as she has in the past and again has a lot of healing compared to previous visits as well. 12/05/2018 on evaluation today patient presents for follow-up concerning her ongoing issues with her bilateral lower extremity ulcers. The good news is her right lower extremity is showing signs of completely healing at this time which is great news. Fortunately there is no evidence of active infection. On the left she has just a very small area still remaining that is open at this time all in all she is very close to complete closure in my opinion. She does have compression stockings to wear at home. 12/12/2018 on evaluation today patient appears to be doing excellent in regard to her left lateral lower extremity ulcer. She has been tolerating the dressing changes without complication. Fortunately there is no signs of active infection at this time. In fact this appears to be pretty much healed at this point although again she is not 100% today. No fevers, chills, nausea, vomiting, or  diarrhea. 12/19/2018 on evaluation today patient actually appears to be doing excellent with  regard to her lower extremity ulcer in fact this appears to be completely healed today which is all some. She has done extremely well with wound care measures. No fevers, chills, nausea, vomiting, or diarrhea. ------------------------------------ 11/01/19-Readmission to the clinic Patient presents with left leg pain, wounds posterior calf, onset about 4 weeks, denies any fevers chills or shakes, has not been using anything to these wounds, was given compression stockings at last discharge from the clinic but has not been able to use them as they rolled down and cause creases Patient's history significant for type 2 diabetes insulin requiring, A1c of 6.9 lately, hypertension, chronic pain 8/18; patient readmitted that the clinic last week. She has wounds on her left lateral posterior lower leg all of this in close juxtaposition i.e. a localized site. We've been using silver alginate under 3 layer compression apparently the wound surface area is better. She was wearing compression stockings from elastic therapy but says they were falling down. As she progresses more towards healing will need to address what we use in terms of compression stockings perhaps external compression garments 11/17/2019 on evaluation today patient appears to be doing well with regard to her legs currently. She does have 2 wounds which are actually measuring much better the more posterior is actually doing very well which I am pleased with the other though smaller is not quite a small but nonetheless on the lateral aspect does seem to be improving. 11/23/2019 on evaluation today patient appears to be doing well in regard to her wounds. In fact the wound on the posterior aspect of her leg appears healed the lateral aspect is still open but extremely small. 9/15; this is a patient with chronic venous insufficiency and secondary lymphedema.  She did not keep her clinic appointment last week and hence the left leg is a lot more swollen since she had to take the wrap off at some point. She has a small weeping area on the left lateral lower leg. She has probably a juxta lite stocking for the left leg I have asked her to bring that in when she comes to her clinic appointment next week at which time she should be healed 10/6; this is a patient with chronic venous insufficiency and secondary lymphedema left greater than right. Skin changes of chronic lymphedema especially in the left leg. She has not been here in 3 weeks. She took the wrap off 2 weeks ago. She has some very old 20/30 stockings from elastic therapy in [place] she has been using. Fortunately her leg is closed. She also has a single [place] wrap for the left leg Readmission: 06/18/2020 upon evaluation today patient presents for reevaluation here in the clinic. She is a long-term patient that we have seen intermittently when she has had flareups of issues with her lower extremity secondary to chronic venous stasis. With that being said unfortunately today she is having a significant issue with a wound which is actually quite significant over the right lower extremity. She did see dermatology they put Vaseline followed by [place] dressings on her unfortunately she is continuing to have significant issues however here with pain and in fact this wrap was extremely stuck to her upon evaluation today. Nonetheless I believe that she is going to require a little bit different approach to try to keep things from sticking and hopefully aid in getting this to heal more effectively and quickly. 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 This started as an injury in mid February and has worsened over the past 2  weeks. 07/04/2020 upon evaluation today patient appears to be doing well with regard to her wound on the leg. Overall I am extremely pleased with where things stand and I do think that she is making excellent progress. There is no sign of infection right now which is also great news. I think that she is close to complete resolution. 07/09/20 upon evaluation today patient appears to be doing excellent in regard to her leg ulcer. She has been tolerating the dressing changes without complication and in general I am extremely pleased with where things stand today. There does not appear to be any signs of active infection at this time which is great news. 07/25/2020 upon evaluation today patient appears to be doing well with regard to her wound. She has been tolerating the dressing changes without complication. Fortunately there is no signs of active infection at this time and her wound appears to be completely healed. Readmission: 09/30/2020 upon evaluation today patient appears for reevaluation here in our clinic. She was discharged May 5 that unfortunately has started to have weeping and wounds on the left leg at this point. She tells me the right leg did weep some but nothing as significant as the left. With all that being said at this point the patient states that she figured it was best to come in here and she was having a lot of drainage and it was get all of her sheets and everything. She has been wearing her Velcro wraps sometimes but it does not sound like she is been wearing them all the time which I think is part of the reason why this reopened. She also unfortunately had a fall which was quite significant. This occurred I believe about a week or 2 ago. Otherwise patient's past medical history really has not changed significantly. 10/07/2020 upon evaluation today patient appears to be doing better in regard to her wounds of the lower extremities. Fortunately there does not appear to be any signs of  active infection at this time which is great news. I do not see any evidence of anything worsening but again she continues to have significant bilateral stage III lymphedema. This has been an ongoing issue. We actually have seen her this year pretty much from March through May of 2022. Following this she continued with compression at home following but then unfortunately reopened again. That was on July 11. With that being said this is an ongoing and recurrent issue for which honestly have seen her over the past 2 years pretty much consistently. Nonetheless we have had Velcro compression wraps that she uses at home and despite this she continues to have frequent and recurrent ulcerations. 7/27; patient presents for 1 week follow-up. She has no issues or complaints today. She denies signs of infection. 10/21/2020 upon evaluation today patient's leg actually appears to be doing decently well on the left there is nothing open on the right at all and I am pleased in that regard. On the left side I do think that she still has some areas of weeping I believe the compression wrap is still the best way to go although I am hopeful that we are headed in the right direction as far as getting this area to heal and silver. Upon inspection patient's wound bed actually showed signs of good granulation  epithelization at this point. There does not appear to be any evidence of active infection which is great news and overall I am extremely pleased with where things stand. 11/04/2020 upon evaluation today patient appears to be doing well with regard to her left leg. In fact this is getting very close to complete granulation and epithelization at this point I am very pleased with where we stand. I do not see any signs of infection currently. 11/18/2020 upon evaluation today patient appears to be doing well in regard to her wound. She is in fact doing extremely well considering the fact we did need to see her last week she  was apparently sick and missed her appointment. She is therefore had the wrap on for 2 weeks. Fortunately there is no signs of active infection at this time. No fevers, chills, nausea, vomiting, or diarrhea. 11/28/2020 upon evaluation today patient appears to be doing well with regard to her legs. Things are healing nicely and overall I do not see any signs of infection at this time which is great news. No fevers, chills, nausea, vomiting, or diarrhea. With that being said she still has a couple areas that are open and weeping currently. 12/10/2020 upon evaluation today patient appears to be doing well with regard to her wound. She has been tolerating the dressing changes without complication. Fortunately there does not appear to be any signs of active infection at this time. No fevers, chills, nausea, vomiting, or diarrhea. 10/11; patient comes in today with really excellent edema control. Everything appears to be closed in clinic occluding the left lateral. The area on the right anterior has some eschar on the top I did not remove this. She has dry scaly skin which might benefit from ammonium lactate but for now I just discharged her with a moisturizer. The patient is also apparently fractured her right great toe. We gave her some stockinette to wear under her juxta lite on that side Readmission 05/07/2021 Ms. 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 There was a revision of the site on 03/13/2021. She has been using gauze to the wound site.She reports pain to the medial aspect with drainage. Home health was changing the dressing however wound care visits have expired. She is trying to do the dressing change on her own but  reports having trouble with this. She currently denies fever/chills. 2/22; patient presents for follow-up. She reports taking Augmentin for the past week. She reports heartburn from this and would like the solution version. She is on doxycycline prescribed by another provider for the next few months. She currently denies systemic signs of infection. She is scheduled for her MRI On 2/27. She has home health and is changing the dressing with Hydrofera Blue. She is unable to do this daily. She has a family member present to help watch how to do the dressing. Electronic Signature(s) Signed: 05/14/2021 2:22:52 PM By: Kalman Shan DO Entered By: Kalman Shan on 05/14/2021 14:13:53 Rish, [name] ([id number]) -------------------------------------------------------------------------------- Physical Exam Details Patient Name: [name] Date of Service: 05/14/2021 1:15 PM Medical Record Number: [id number] Patient Account Number: [id number] Date of Birth/Sex: 11-Jul-1965 (56 y.o. F) Treating RN: Carlene Coria Primary Care Provider: Laurance Flatten Other Clinician: Referring Provider: Trilby Drummer,  Jon Treating Provider/Extender: Yaakov Guthrie in Treatment: 1 Constitutional . Psychiatric . Notes Right foot: To the great toe amputation site there is dehiscence. There is nonviable tissue at the opening. To the medial aspect there are 2 small openings with yellow thick drainage. There is no longer erythema present and minimal tenderness to palpation. Electronic Signature(s) Signed: 05/14/2021 2:22:52 PM By: Kalman Shan DO Entered By: Kalman Shan on 05/14/2021 14:14:32 Markgraf, [name] ([id number]) -------------------------------------------------------------------------------- Physician Orders Details Patient Name: [name] Date of Service: 05/14/2021 1:15 PM Medical Record Number: [id number] Patient Account Number: [id number] Date of Birth/Sex: [birth year] (55 y.o. F) Treating  RN: Carlene Coria Primary Care Provider: Laurance Flatten Other Clinician: Referring Provider: Laurance Flatten Treating Provider/Extender: Yaakov Guthrie in Treatment: 1 Verbal / Phone Orders: No Diagnosis Coding Follow-up Appointments o Return Appointment in 1 week. o Nurse Visit as needed [place] for wound care. May utilize formulary equivalent dressing for wound treatment orders unless otherwise specified. Home Health Nurse may visit PRN to address patientos wound care needs. o **Please direct any NON-WOUND related issues/requests for orders to patient's Primary Care Physician. **If current dressing causes regression in wound condition, may D/C ordered dressing product/s and apply Normal Saline Moist Dressing daily until next [place] or Other MD appointment. **Notify [facility] of regression in wound condition at [phone]. o Other Home Health Orders/Instructions: - please change dressing 3 times a week Bathing/ Shower/ Hygiene o Wash wounds with antibacterial soap and water. o May shower with wound dressing protected with water repellent cover or cast protector. o No tub bath. Off-Loading o Other: - Try your best to stay off wound as best as you can Medications-Please add to medication list. o P.O. Antibiotics - PIck up and take as directed Wound Treatment Wound #10 - Foot Wound Laterality: Plantar, Right Primary Dressing: Drawtex Hydroconductive Wound Dressing, 3.5x12 (in/in) 1 x Per Day/30 Days Discharge Instructions: placed on plantar part of foot for drainage Secondary Dressing: Conforming Guaze Roll-Medium 1 x Per Day/30 Days Discharge Instructions: [name] as directed Secondary Dressing: Gauze 1 x Per Day/30 Days Discharge Instructions: dry Secured With: 75M Medipore H Soft Cloth Surgical Tape, 2x2 (in/yd) 1 x Per Day/30  Days Wound #9 - Foot Wound Laterality: Right, Midline Primary Dressing: Hydrofera Blue Ready Transfer Foam, 4x5 (in/in) 1 x Per Day/30 Days Discharge Instructions: Apply Hydrofera Blue Ready to wound bed as directed Secondary Dressing: Conforming Guaze Roll-Medium 1 x Per Day/30 Days Discharge Instructions: [place] as directed Secondary Dressing: Gauze 1 x Per Day/30 Days Discharge Instructions: dry Secured With: 75M Medipore H Soft Cloth Surgical Tape, 2x2 (in/yd) 1 x Per Day/30 Days Electronic Signature(s) Signed: 05/14/2021 2:22:52 PM By: Kalman Shan DO Breeding, [place]ell[place] ([id number]) Entered By: Kalman Shan on 05/14/2021 14:22:22 Mcguiness, [name] ([id number]) -------------------------------------------------------------------------------- Problem List Details Patient Name: [name] Date of Service: 05/14/2021 1:15 PM Medical Record Number: [id number] Patient Account Number: [id number] Date of Birth/Sex: 1965-04-10 (56 y.o. 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 F) Treating RN: Carlene Coria Primary Care Provider: Laurance Flatten Other Clinician: Referring Provider: Laurance Flatten Treating Provider/Extender: Yaakov Guthrie in Treatment: 1 Subjective Chief Complaint Information obtained from Patient 05/07/2021; surgical wound dehiscence to the right great toe amputation site. History of Present Illness (HPI) 10/24/2018 on evaluation today patient presents for initial evaluation or clinic concerning issues that she has been having with lymphedema for quite some time. Unfortunately she has several wound openings at this point that secondary to her lymphedema/venous stasis are giving her trouble and leaking quite severely. She also has diabetes along with hypertension and stage III chronic kidney disease. Fortunately there is no signs of active infection at this time. She is going to likely require debridement of the left leg ulcer upon evaluation today just based on what I am seeing. Fortunately there is no wound opening on the right. Overall the patient seems to be doing quite well and again there is no evidence of systemic infection which is good news. No fevers, chills, nausea, vomiting, or diarrhea. 10/31/2018 on evaluation today patient actually appears to be doing excellent in regard to her lower extremity ulcers. She has been tolerating the dressing changes without complication. Fortunately there is no signs of active infection at this time. She has tolerated 3 layer compression wrap without complication. 11/07/2018 upon evaluation today patient actually appears to be doing very well with regard to her left lower extremity ulcers. She has been tolerating the dressing changes without complication. Fortunately there is no signs of active infection. No fevers, chills, nausea, vomiting, or diarrhea. 11/21/2018 upon evaluation today patient appears to be doing  quite well with regard to her lower extremity ulcers. In fact both areas seem to be showing signs of good improvement which is excellent. She is not having as much pain as she has in the past and again has a lot of healing compared to previous visits as well. 12/05/2018 on evaluation today patient presents for follow-up concerning her ongoing issues with her bilateral lower extremity ulcers. The good news is her right lower extremity is showing signs of completely healing at this time which is great news. Fortunately there is no evidence of active infection. On the left she has just a very small area still remaining that is open at this time all in all she is very close to complete closure in my opinion. She does have compression stockings to wear at home. 12/12/2018 on evaluation today patient appears to be doing excellent in regard to her left lateral lower extremity ulcer. She has been tolerating the dressing changes without complication. Fortunately there is no signs of active infection at this time. In fact this appears to be pretty much healed at this point although again she is not 100% today. No fevers, chills, nausea, vomiting, or diarrhea. 12/19/2018 on evaluation today patient actually appears to be doing excellent with regard to her lower extremity ulcer in fact this appears to be completely healed today which is all some. She has done extremely well with wound care measures. No fevers, chills, nausea, vomiting, or diarrhea. ------------------------------------ 11/01/19-Readmission to the clinic Patient presents with left leg pain, wounds posterior calf,  onset about 4 weeks, denies any fevers chills or shakes, has not been using anything to these wounds, was given compression stockings at last discharge from the clinic but has not been able to use them as they rolled down and cause creases Patient's history significant for type 2 diabetes insulin requiring, A1c of 6.9 lately, hypertension,  chronic pain 8/18; patient readmitted that the clinic last week. She has wounds on her left lateral posterior lower leg all of this in close juxtaposition i.e. a localized site. We've been using silver alginate under 3 layer compression apparently the wound surface area is better. She was wearing compression stockings from elastic therapy but says they were falling down. As she progresses more towards healing will need to address what we use in terms of compression stockings perhaps external compression garments 11/17/2019 on evaluation today patient appears to be doing well with regard to her legs currently. She does have 2 wounds which are actually measuring much better the more posterior is actually doing very well which I am pleased with the other though smaller is not quite a small but nonetheless on the lateral aspect does seem to be improving. 11/23/2019 on evaluation today patient appears to be doing well in regard to her wounds. In fact the wound on the posterior aspect of her leg appears healed the lateral aspect is still open but extremely small. 9/15; this is a patient with chronic venous insufficiency and secondary lymphedema. She did not keep her clinic appointment last week and hence the left leg is a lot more swollen since she had to take the wrap off at some point. She has a small weeping area on the left lateral lower leg. She has probably a juxta lite stocking for the left leg I have asked her to bring that in when she comes to her clinic appointment next week at which time she should be healed 10/6; this is a patient with chronic venous insufficiency and secondary lymphedema left greater than right. Skin changes of chronic lymphedema especially in the left leg. She has not been here in 3 weeks. She took the wrap off 2 weeks ago. She has some very old 20/30 stockings from elastic therapy in [place] she has been using. Fortunately her leg is closed. She also has a single [place] wrap  for the left leg Readmission: 06/18/2020 upon evaluation today patient presents for reevaluation here in the clinic. She is a long-term patient that we have seen intermittently Bogden, Jadelin ([id number]) when she has had flareups of issues with her lower extremity secondary to chronic venous stasis. With that being said unfortunately today she is having a significant issue with a wound which is actually quite significant over the right lower extremity. She did see dermatology they put Vaseline followed by [place] dressings on her unfortunately she is continuing to have significant issues however here with pain and in fact this wrap was extremely stuck to her upon evaluation today. Nonetheless I believe that she is going to require a little bit different approach to try to keep things from sticking and hopefully aid in getting this to heal more effectively and quickly. With that being said she does have a history of chronic venous insufficiency, lymphedema, diabetes mellitus type 2, chronic kidney disease stage III, hypertension, and congestive heart failure. This started as an injury in mid February and has worsened over the past 2 weeks. 07/04/2020 upon evaluation today patient appears to be doing well with regard to her wound on  the leg. Overall I am extremely pleased with where things stand and I do think that she is making excellent progress. There is no sign of infection right now which is also great news. I think that she is close to complete resolution. 07/09/20 upon evaluation today patient appears to be doing excellent in regard to her leg ulcer. She has been tolerating the dressing changes without complication and in general I am extremely pleased with where things stand today. There does not appear to be any signs of active infection at this time which is great news. 07/25/2020 upon evaluation today patient appears to be doing well with regard to her wound. She has been tolerating the  dressing changes without complication. Fortunately there is no signs of active infection at this time and her wound appears to be completely healed. Readmission: 09/30/2020 upon evaluation today patient appears for reevaluation here in our clinic. She was discharged May 5 that unfortunately has started to have weeping and wounds on the left leg at this point. She tells me the right leg did weep some but nothing as significant as the left. With all that being said at this point the patient states that she figured it was best to come in here and she was having a lot of drainage and it was get all of her sheets and everything. She has been wearing her Velcro wraps sometimes but it does not sound like she is been wearing them all the time which I think is part of the reason why this reopened. She also unfortunately had a fall which was quite significant. This occurred I believe about a week or 2 ago. Otherwise patient's past medical history really has not changed significantly. 10/07/2020 upon evaluation today patient appears to be doing better in regard to her wounds of the lower extremities. Fortunately there does not appear to be any signs of active infection at this time which is great news. I do not see any evidence of anything worsening but again she continues to have significant bilateral stage III lymphedema. This has been an ongoing issue. We actually have seen her this year pretty much from March through May of 2022. Following this she continued with compression at home following but then unfortunately reopened again. That was on July 11. With that being said this is an ongoing and recurrent issue for which honestly have seen her over the past 2 years pretty much consistently. Nonetheless we have had Velcro compression wraps that she uses at home and despite this she continues to have frequent and recurrent ulcerations. 7/27; patient presents for 1 week follow-up. She has no issues or complaints  today. She denies signs of infection. 10/21/2020 upon evaluation today patient's leg actually appears to be doing decently well on the left there is nothing open on the right at all and I am pleased in that regard. On the left side I do think that she still has some areas of weeping I believe the compression wrap is still the best way to go although I am hopeful that we are headed in the right direction as far as getting this area to heal and silver. Upon inspection patient's wound bed actually showed signs of good granulation epithelization at this point. There does not appear to be any evidence of active infection which is great news and overall I am extremely pleased with where things stand. 11/04/2020 upon evaluation today patient appears to be doing well with regard to her left leg. In fact this  is getting very close to complete granulation and epithelization at this point I am very pleased with where we stand. I do not see any signs of infection currently. 11/18/2020 upon evaluation today patient appears to be doing well in regard to her wound. She is in fact doing extremely well considering the fact we did need to see her last week she was apparently sick and missed her appointment. She is therefore had the wrap on for 2 weeks. Fortunately there is no signs of active infection at this time. No fevers, chills, nausea, vomiting, or diarrhea. 11/28/2020 upon evaluation today patient appears to be doing well with regard to her legs. Things are healing nicely and overall I do not see any signs of infection at this time which is great news. No fevers, chills, nausea, vomiting, or diarrhea. With that being said she still has a couple areas that are open and weeping currently. 12/10/2020 upon evaluation today patient appears to be doing well with regard to her wound. She has been tolerating the dressing changes without complication. Fortunately there does not appear to be any signs of active infection at this  time. No fevers, chills, nausea, vomiting, or diarrhea. 10/11; patient comes in today with really excellent edema control. Everything appears to be closed in clinic occluding the left lateral. The area on the right anterior has some eschar on the top I did not remove this. She has dry scaly skin which might benefit from ammonium lactate but for now I just discharged her with a moisturizer. The patient is also apparently fractured her right great toe. We gave her some stockinette to wear under her juxta lite on that side Readmission 05/07/2021 Ms. 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 There was a revision of the site on 03/13/2021. She has been using gauze to the wound site.She reports pain to the medial aspect with drainage. Home health was changing the dressing however wound care visits have expired. She is trying to do the dressing change on her own but reports having trouble with this. She currently denies fever/chills. 2/22; patient presents for follow-up. She reports taking Augmentin for the past week. She reports heartburn from this and would like the solution version. She is on doxycycline prescribed by another provider for the next few months. She currently denies systemic signs of infection. She is scheduled for her MRI On 2/27. She has home health and is changing the dressing with Hydrofera Blue. She is unable to do this daily. She has a family member present to help watch how to do the dressing. Rane, Nazyia ([id number]) Objective Constitutional Vitals Time Taken: 1:25 PM, Temperature: 99.5 F, Pulse: 91 bpm, Respiratory Rate: 18 breaths/min, Blood Pressure: 135/66 mmHg. General Notes: Right foot: To  the great toe amputation site there is dehiscence. There is nonviable tissue at the opening. To the medial aspect there are 2 small openings with yellow thick drainage. There is no longer erythema present and minimal tenderness to palpation. Integumentary (Hair, Skin) Wound #10 status is Open. Original cause of wound was Surgical Injury. The date acquired was: 03/13/2021. The wound has been in treatment 1 weeks. The wound is located on the [place]. The wound measures 0.4cm length x 0.3cm  width x 0.2cm depth; 0.094cm^2 area and 0.019cm^3 volume. There is Fat Layer (Subcutaneous Tissue) exposed. There is no tunneling or undermining noted. There is a medium amount of serosanguineous drainage noted. There is small (1-33%) granulation within the wound bed. There is a large (67-100%) amount of necrotic tissue within the wound bed including Adherent Slough. Wound #9 status is Open. Original cause of wound was Surgical Injury. The date acquired was: 03/13/2021. The wound has been in treatment 1 weeks. The wound is located on the [place]. The wound measures 1.5cm length x 5cm width x 1.5cm depth; 5.89cm^2 area and 8.836cm^3 volume. There is no tunneling or undermining noted. There is a medium amount of serosanguineous drainage noted. There is no granulation within the wound bed. There is a large (67-100%) amount of necrotic tissue within the wound bed including Adherent Slough. Assessment Active Problems ICD-10 Type 2 diabetes mellitus with foot ulcer Disruption of wound, unspecified, initial encounter Non-pressure chronic ulcer of other part of right foot with fat layer exposed Osteomyelitis, unspecified Overall the appearance of the wound and foot has improved with the addition of Augmentin. There is less drainage on exam however Yellow drainage is still present. Since she is having trouble with the oral version of Augmentin I will switch this to suspension form. I personally  spoke with the pharmacist to have this changed. She is scheduled to have her MRI in 2/27. 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 At this time I have requested that she go back to see her vascular surgeon Dr. Wilhelmenia Blase to assess blood flow status currently. Patient is at high risk for amputation and this was discussed with her again today. I recommended continuing with Hydrofera Blue daily. Plan Follow-up Appointments: Return Appointment in 1 week. Nurse Visit as needed Home Health: [facility]: - [place] for wound care. May utilize formulary equivalent dressing for wound treatment orders unless otherwise specified. Home Health Nurse may visit [facility] to address patient s wound care needs. **Please direct any NON-WOUND related issues/requests for orders to patient's Primary Care Physician. **If current dressing causes regression in wound condition, may D/C ordered dressing product/s and apply Normal Saline Moist Dressing daily until next [place] or Other MD appointment. **Notify [facility] of regression in wound condition at [phone]. Other Home Health Orders/Instructions: - please change dressing 3 times a week Bathing/ Shower/ Hygiene: Wash wounds with antibacterial soap and water. May shower with wound dressing protected with water repellent cover or cast protector. No tub bath. Off-Loading: Other: - Try your best to stay off wound as best as you can Medications-Please add to medication list.: P.O. Antibiotics - PIck up and take as directed WOUND #10: - Foot Wound Laterality: Plantar, Right Primary Dressing: Drawtex Hydroconductive Wound Dressing, 3.5x12 (in/in) 1 x Per Day/30 Days Castonguay, Melora ([id number]) Discharge Instructions: placed on  plantar part of foot for drainage Secondary Dressing: Conforming Guaze Roll-Medium 1 x Per Day/30 Days Discharge Instructions: Apply Conforming Stretch Guaze Bandage as directed Secondary Dressing: Gauze 1 x Per Day/30 Days Discharge Instructions: dry Secured With: 52M Medipore H Soft Cloth Surgical Tape, 2x2 (in/yd) 1 x Per Day/30 Days WOUND #9: - Foot Wound Laterality: Right, Midline Primary Dressing: Hydrofera Blue Ready Transfer Foam, 4x5 (in/in) 1 x Per Day/30 Days Discharge  Instructions: Apply Hydrofera Blue Ready to wound bed as directed Secondary Dressing: Conforming Guaze Roll-Medium 1 x Per Day/30 Days Discharge Instructions: Apply Conforming Stretch Guaze Bandage as directed Secondary Dressing: Gauze 1 x Per Day/30 Days Discharge Instructions: dry Secured With: 41M Medipore H Soft Cloth Surgical Tape, 2x2 (in/yd) 1 x Per Day/30 Days 1. Hydrofera Blue 2. Continue Augmentin, doxycycline 3. Referral back to vein and vascular Dr. Wilhelmenia Blase 4. Follow-up in 1 week Electronic Signature(s) Signed: 05/14/2021 2:22:52 PM By: Kalman Shan DO Entered By: Kalman Shan on 05/14/2021 14:21:34 Stamas, [name] ([id number]) -------------------------------------------------------------------------------- SuperBill Details Patient Name: [name] Date of Service: 05/14/2021 Medical Record Number: [id number] Patient Account Number: [id number] Date of Birth/Sex: 1966/02/20 (56 y.o. F) Treating RN: Carlene Coria Primary Care Provider: Laurance Flatten Other Clinician: Referring Provider: Laurance Flatten Treating Provider/Extender: Yaakov Guthrie in Treatment: 1 Diagnosis Coding ICD-10 Codes Code Description E11.621 Type 2 diabetes mellitus with foot ulcer T81.30XA Disruption of wound, unspecified, initial encounter L97.512 Non-pressure chronic ulcer of other part of right foot with fat layer exposed M86.9 Osteomyelitis, unspecified L03.115 Cellulitis of right lower limb Facility  Procedures CPT4 Code: [id number] Description: 47841 - WOUND CARE VISIT-LEV 3 EST PT Modifier: Quantity: 1 Physician Procedures CPT4 Code: 2820813 Description: 88719 - WC PHYS LEVEL 3 - EST PT Modifier: Quantity: 1 CPT4 Code: Description: ICD-10 Diagnosis Description E11.621 Type 2 diabetes mellitus with foot ulcer T81.30XA Disruption of wound, unspecified, initial encounter L97.512 Non-pressure chronic ulcer of other part of right foot with fat layer e M86.9 Osteomyelitis,  unspecified Modifier: xposed Quantity: Electronic Signature(s) Signed: 05/14/2021 2:22:52 PM By: Kalman Shan DO Entered By: Kalman Shan on 05/14/2021 14:21:54

## 2021-05-19 ENCOUNTER — Ambulatory Visit
Admission: RE | Admit: 2021-05-19 | Discharge: 2021-05-19 | Disposition: A | Discharge status: Home / Self Care | Payer: Medicare Other | Source: Ambulatory Visit | Attending: Internal Medicine | Admitting: Internal Medicine

## 2021-05-19 DIAGNOSIS — L97512 Non-pressure chronic ulcer of other part of right foot with fat layer exposed: Secondary | ICD-10-CM | POA: Insufficient documentation

## 2021-05-21 ENCOUNTER — Other Ambulatory Visit: Payer: Self-pay

## 2021-05-21 ENCOUNTER — Encounter: Payer: Medicare Other | Attending: Internal Medicine | Admitting: Internal Medicine

## 2021-05-21 ENCOUNTER — Other Ambulatory Visit
Admission: RE | Admit: 2021-05-21 | Discharge: 2021-05-21 | Disposition: A | Discharge status: Home / Self Care | Payer: Medicare Other | Source: Ambulatory Visit | Attending: Physician Assistant | Admitting: Physician Assistant

## 2021-05-21 DIAGNOSIS — I132 Hypertensive heart and chronic kidney disease with heart failure and with stage 5 chronic kidney disease, or end stage renal disease: Secondary | ICD-10-CM | POA: Insufficient documentation

## 2021-05-21 DIAGNOSIS — M869 Osteomyelitis, unspecified: Secondary | ICD-10-CM | POA: Diagnosis not present

## 2021-05-21 DIAGNOSIS — I890 Lymphedema, not elsewhere classified: Secondary | ICD-10-CM | POA: Diagnosis not present

## 2021-05-21 DIAGNOSIS — T8130XA Disruption of wound, unspecified, initial encounter: Secondary | ICD-10-CM | POA: Insufficient documentation

## 2021-05-21 DIAGNOSIS — Z794 Long term (current) use of insulin: Secondary | ICD-10-CM | POA: Insufficient documentation

## 2021-05-21 DIAGNOSIS — E1122 Type 2 diabetes mellitus with diabetic chronic kidney disease: Secondary | ICD-10-CM | POA: Diagnosis not present

## 2021-05-21 DIAGNOSIS — E1169 Type 2 diabetes mellitus with other specified complication: Secondary | ICD-10-CM | POA: Diagnosis present

## 2021-05-21 DIAGNOSIS — L089 Local infection of the skin and subcutaneous tissue, unspecified: Secondary | ICD-10-CM | POA: Insufficient documentation

## 2021-05-21 DIAGNOSIS — L97512 Non-pressure chronic ulcer of other part of right foot with fat layer exposed: Secondary | ICD-10-CM | POA: Insufficient documentation

## 2021-05-21 DIAGNOSIS — N186 End stage renal disease: Secondary | ICD-10-CM | POA: Insufficient documentation

## 2021-05-21 DIAGNOSIS — I872 Venous insufficiency (chronic) (peripheral): Secondary | ICD-10-CM | POA: Diagnosis not present

## 2021-05-21 DIAGNOSIS — Z89411 Acquired absence of right great toe: Secondary | ICD-10-CM | POA: Diagnosis not present

## 2021-05-21 DIAGNOSIS — E11621 Type 2 diabetes mellitus with foot ulcer: Secondary | ICD-10-CM | POA: Diagnosis not present

## 2021-05-21 DIAGNOSIS — X58XXXA Exposure to other specified factors, initial encounter: Secondary | ICD-10-CM | POA: Insufficient documentation

## 2021-05-21 DIAGNOSIS — I509 Heart failure, unspecified: Secondary | ICD-10-CM | POA: Insufficient documentation

## 2021-05-21 NOTE — Progress Notes (Signed)
[name], [name] ([id number]) Visit Report for 05/21/2021 Chief Complaint Document Details Patient Name: [name], MOW Date of Service: 05/21/2021 10:30 AM Medical Record Number: [id number] Patient Account Number: [id number] Date of Birth/Sex: 05/04/1965 (56 y.o. F) Treating RN: Levora Dredge Primary Care Provider: Laurance Flatten Other Clinician: Referring Provider: Laurance Flatten Treating Provider/Extender: Yaakov Guthrie in Treatment: 2 Information Obtained from: Patient Chief Complaint 05/07/2021; surgical wound dehiscence to the right great toe amputation site. Electronic Signature(s) Signed: 05/21/2021 11:48:36 AM By: Kalman Shan DO Entered By: Kalman Shan on 05/21/2021 11:16:10 Hillyard, [name] ([id number]) -------------------------------------------------------------------------------- HPI Details Patient Name: [name] Date of Service: 05/21/2021 10:30 AM Medical Record Number: [id number] Patient Account Number: [id number] Date of Birth/Sex: 02-11-1966 (55 y.o. F) Treating RN: Levora Dredge Primary Care Provider: Laurance Flatten Other Clinician: Referring Provider: Laurance Flatten Treating Provider/Extender: Yaakov Guthrie in Treatment: 2 History of Present Illness HPI Description: 10/24/2018 on evaluation today patient presents for initial evaluation or clinic concerning issues that she has been having with lymphedema for quite some time. Unfortunately she has several wound openings at this point that secondary to her lymphedema/venous stasis are giving her trouble and leaking quite severely. She also has diabetes along with hypertension and stage III chronic kidney disease. Fortunately there is no signs of active infection at this time. She is going to likely require debridement of the left leg ulcer upon evaluation today just based on what I am seeing. Fortunately there is no wound opening on the right. Overall the patient seems to be doing quite well and again there  is no evidence of systemic infection which is good news. No fevers, chills, nausea, vomiting, or diarrhea. 10/31/2018 on evaluation today patient actually appears to be doing excellent in regard to her lower extremity ulcers. She has been tolerating the dressing changes without complication. Fortunately there is no signs of active infection at this time. She has tolerated 3 layer compression wrap without complication. 11/07/2018 upon evaluation today patient actually appears to be doing very well with regard to her left lower extremity ulcers. She has been tolerating the dressing changes without complication. Fortunately there is no signs of active infection. No fevers, chills, nausea, vomiting, or diarrhea. 11/21/2018 upon evaluation today patient appears to be doing quite well with regard to her lower extremity ulcers. In fact both areas seem to be showing signs of good improvement which is excellent. She is not having as much pain as she has in the past and again has a lot of healing compared to previous visits as well. 12/05/2018 on evaluation today patient presents for follow-up concerning her ongoing issues with her bilateral lower extremity ulcers. The good news is her right lower extremity is showing signs of completely healing at this time which is great news. Fortunately there is no evidence of active infection. On the left she has just a very small area still remaining that is open at this time all in all she is very close to complete closure in my opinion. She does have compression stockings to wear at home. 12/12/2018 on evaluation today patient appears to be doing excellent in regard to her left lateral lower extremity ulcer. She has been tolerating the dressing changes without complication. Fortunately there is no signs of active infection at this time. In fact this appears to be pretty much healed at this point although again she is not 100% today. No fevers, chills, nausea, vomiting, or  diarrhea. 12/19/2018 on evaluation today patient actually appears to be doing excellent with  regard to her lower extremity ulcer in fact this appears to be completely healed today which is all some. She has done extremely well with wound care measures. No fevers, chills, nausea, vomiting, or diarrhea. ------------------------------------ 11/01/19-Readmission to the clinic Patient presents with left leg pain, wounds posterior calf, onset about 4 weeks, denies any fevers chills or shakes, has not been using anything to these wounds, was given compression stockings at last discharge from the clinic but has not been able to use them as they rolled down and cause creases Patient's history significant for type 2 diabetes insulin requiring, A1c of 6.9 lately, hypertension, chronic pain 8/18; patient readmitted that the clinic last week. She has wounds on her left lateral posterior lower leg all of this in close juxtaposition i.e. a localized site. We've been using silver alginate under 3 layer compression apparently the wound surface area is better. She was wearing compression stockings from [facility] but says they were falling down. As she progresses more towards healing will need to address what we use in terms of compression stockings perhaps external compression garments 11/17/2019 on evaluation today patient appears to be doing well with regard to her legs currently. She does have 2 wounds which are actually measuring much better the more posterior is actually doing very well which I am pleased with the other though smaller is not quite a small but nonetheless on the lateral aspect does seem to be improving. 11/23/2019 on evaluation today patient appears to be doing well in regard to her wounds. In fact the wound on the posterior aspect of her leg appears healed the lateral aspect is still open but extremely small. 9/15; this is a patient with chronic venous insufficiency and secondary lymphedema.  She did not keep her clinic appointment last week and hence the left leg is a lot more swollen since she had to take the wrap off at some point. She has a small weeping area on the left lateral lower leg. She has probably a juxta lite stocking for the left leg I have asked her to bring that in when she comes to her clinic appointment next week at which time she should be healed 10/6; this is a patient with chronic venous insufficiency and secondary lymphedema left greater than right. Skin changes of chronic lymphedema especially in the left leg. She has not been here in 3 weeks. She took the wrap off 2 weeks ago. She has some very old 20/30 stockings from elastic therapy in [place] she has been using. Fortunately her leg is closed. She also has a single [place] wrap for the left leg Readmission: 06/18/2020 upon evaluation today patient presents for reevaluation here in the clinic. She is a long-term patient that we have seen intermittently when she has had flareups of issues with her lower extremity secondary to chronic venous stasis. With that being said unfortunately today she is having a significant issue with a wound which is actually quite significant over the right lower extremity. She did see dermatology they put Vaseline followed by [place] dressings on her unfortunately she is continuing to have significant issues however here with pain and in fact this wrap was extremely stuck to her upon evaluation today. Nonetheless I believe that she is going to require a little bit different approach to try to keep things from sticking and hopefully aid in getting this to heal more effectively and quickly. 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 This started as an injury in mid February and has worsened over the past 2  weeks. 07/04/2020 upon evaluation today patient appears to be doing well with regard to her wound on the leg. Overall I am extremely pleased with where things stand and I do think that she is making excellent progress. There is no sign of infection right now which is also great news. I think that she is close to complete resolution. 07/09/20 upon evaluation today patient appears to be doing excellent in regard to her leg ulcer. She has been tolerating the dressing changes without complication and in general I am extremely pleased with where things stand today. There does not appear to be any signs of active infection at this time which is great news. 07/25/2020 upon evaluation today patient appears to be doing well with regard to her wound. She has been tolerating the dressing changes without complication. Fortunately there is no signs of active infection at this time and her wound appears to be completely healed. Readmission: 09/30/2020 upon evaluation today patient appears for reevaluation here in our clinic. She was discharged May 5 that unfortunately has started to have weeping and wounds on the left leg at this point. She tells me the right leg did weep some but nothing as significant as the left. With all that being said at this point the patient states that she figured it was best to come in here and she was having a lot of drainage and it was get all of her sheets and everything. She has been wearing her Velcro wraps sometimes but it does not sound like she is been wearing them all the time which I think is part of the reason why this reopened. She also unfortunately had a fall which was quite significant. This occurred I believe about a week or 2 ago. Otherwise patient's past medical history really has not changed significantly. 10/07/2020 upon evaluation today patient appears to be doing better in regard to her wounds of the lower extremities. Fortunately there does not appear to be any signs of  active infection at this time which is great news. I do not see any evidence of anything worsening but again she continues to have significant bilateral stage III lymphedema. This has been an ongoing issue. We actually have seen her this year pretty much from March through May of 2022. Following this she continued with compression at home following but then unfortunately reopened again. That was on July 11. With that being said this is an ongoing and recurrent issue for which honestly have seen her over the past 2 years pretty much consistently. Nonetheless we have had Velcro compression wraps that she uses at home and despite this she continues to have frequent and recurrent ulcerations. 7/27; patient presents for 1 week follow-up. She has no issues or complaints today. She denies signs of infection. 10/21/2020 upon evaluation today patient's leg actually appears to be doing decently well on the left there is nothing open on the right at all and I am pleased in that regard. On the left side I do think that she still has some areas of weeping I believe the compression wrap is still the best way to go although I am hopeful that we are headed in the right direction as far as getting this area to heal and silver. Upon inspection patient's wound bed actually showed signs of good granulation  epithelization at this point. There does not appear to be any evidence of active infection which is great news and overall I am extremely pleased with where things stand. 11/04/2020 upon evaluation today patient appears to be doing well with regard to her left leg. In fact this is getting very close to complete granulation and epithelization at this point I am very pleased with where we stand. I do not see any signs of infection currently. 11/18/2020 upon evaluation today patient appears to be doing well in regard to her wound. She is in fact doing extremely well considering the fact we did need to see her last week she  was apparently sick and missed her appointment. She is therefore had the wrap on for 2 weeks. Fortunately there is no signs of active infection at this time. No fevers, chills, nausea, vomiting, or diarrhea. 11/28/2020 upon evaluation today patient appears to be doing well with regard to her legs. Things are healing nicely and overall I do not see any signs of infection at this time which is great news. No fevers, chills, nausea, vomiting, or diarrhea. With that being said she still has a couple areas that are open and weeping currently. 12/10/2020 upon evaluation today patient appears to be doing well with regard to her wound. She has been tolerating the dressing changes without complication. Fortunately there does not appear to be any signs of active infection at this time. No fevers, chills, nausea, vomiting, or diarrhea. 10/11; patient comes in today with really excellent edema control. Everything appears to be closed in clinic occluding the left lateral. The area on the right anterior has some eschar on the top I did not remove this. She has dry scaly skin which might benefit from ammonium lactate but for now I just discharged her with a moisturizer. The patient is also apparently fractured her right great toe. We gave her some stockinette to wear under her juxta lite on that side Readmission 05/07/2021 Ms. Felise Georgia is a 56 year old female with a past medical history of uncontrolled type 2 diabetes on insulin, OSA, coronary artery disease and hypertension that presents to the clinic for a nonhealing amputation surgical site of the right great toe. 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 There was a revision of the site on 03/13/2021. She has been using gauze to the wound site.She reports pain to the medial aspect with drainage. Home health was changing the dressing however wound care visits have expired. She is trying to do the dressing change on her own but  reports having trouble with this. She currently denies fever/chills. 2/22; patient presents for follow-up. She reports taking Augmentin for the past week. She reports heartburn from this and would like the solution version. She is on doxycycline prescribed by another provider for the next few months. She currently denies systemic signs of infection. She is scheduled for her MRI On 2/27. She has home health and is changing the dressing with Hydrofera Blue. She is unable to do this daily. She has a family member present to help watch how to do the dressing. 3/1; patient presents for follow-up. She was able to obtain the solution for Augmentin from her pharmacy however there was a short supply and this is on backorder currently. She continues to take doxycycline as prescribed. She reports improvement in her foot pain. She had her MRI completed. She states she has not scheduled an appointment with vein and vascular. She is doing  Hydrofera Blue with dressing changes. She denies systemic signs of infection. Electronic Signature(s) Signed: 05/21/2021 11:48:36 AM By: Velda Shell, Shrita ([id number]) Entered By: Kalman Shan on 05/21/2021 11:17:17 Brick, [name] ([id number]) -------------------------------------------------------------------------------- Physical Exam Details Patient Name: [name] Date of Service: 05/21/2021 10:30 AM Medical Record Number: [id number] Patient Account Number: [id number] Date of Birth/Sex: 05-06-1965 (55 y.o. F) Treating RN: Levora Dredge Primary Care Provider: Laurance Flatten Other Clinician: Referring Provider: Laurance Flatten Treating Provider/Extender: Yaakov Guthrie in Treatment: 2 Constitutional . Psychiatric . Notes Right foot: To the great toe amputation site there is dehiscence. There is nonviable tissue at the opening. To the medial aspect there are 2 small openings with yellow thick drainage. Mild erythema present and no tenderness  to palpation. Difficult to palpate pedal pulses. Electronic Signature(s) Signed: 05/21/2021 11:48:36 AM By: Kalman Shan DO Entered By: Kalman Shan on 05/21/2021 11:18:46 Bauernfeind, [name] ([id number]) -------------------------------------------------------------------------------- Physician Orders Details Patient Name: [name] Date of Service: 05/21/2021 10:30 AM Medical Record Number: [id number] Patient Account Number: [id number] Date of Birth/Sex: September 25, 1965 (55 y.o. F) Treating RN: Levora Dredge Primary Care Provider: Laurance Flatten Other Clinician: Referring Provider: Laurance Flatten Treating Provider/Extender: Yaakov Guthrie in Treatment: 2 Verbal / Phone Orders: No Diagnosis Coding Follow-up Appointments o Return Appointment in 1 week. o Nurse Visit as needed [place] for wound care. May utilize formulary equivalent dressing for wound treatment orders unless otherwise specified. Home Health Nurse may visit PRN to address patientos wound care needs. o **Please direct any NON-WOUND related issues/requests for orders to patient's Primary Care Physician. **If current dressing causes regression in wound condition, may D/C ordered dressing product/s and apply Normal Saline Moist Dressing daily until next [facility] or Other MD appointment. **Notify [facility] of regression in wound condition at [phone]. o Other Home Health Orders/Instructions: - please change dressing 3 times a week Bathing/ Shower/ Hygiene o Wash wounds with antibacterial soap and water. o May shower with wound dressing protected with water repellent cover or cast protector. o No tub bath. Off-Loading o Other: - Try your best to stay off wound as best as you can Medications-Please add to medication list. o P.O. Antibiotics - PIck up and take as directed Wound Treatment Wound #10 - Foot  Wound Laterality: Plantar, Right Primary Dressing: Drawtex Hydroconductive Wound Dressing, 3.5x12 (in/in) 1 x Per Day/30 Days Discharge Instructions: placed on plantar part of foot for drainage Secondary Dressing: Conforming Guaze Roll-Medium 1 x Per Day/30 Days Discharge Instructions: Apply Conforming Stretch Guaze Bandage as directed Secondary Dressing: Gauze 1 x Per Day/30 Days Discharge Instructions: dry Secured With: 579M Medipore H Soft Cloth Surgical Tape, 2x2 (in/yd) 1 x Per Day/30 Days Wound #9 - Foot Wound Laterality: Right, Midline Primary Dressing: Hydrofera Blue Ready Transfer Foam, 4x5 (in/in) 1 x Per Day/30 Days Discharge Instructions: Apply Hydrofera Blue Ready to wound bed as directed Secondary Dressing: ABD Pad 5x9 (in/in) 1 x Per Day/30 Days Discharge Instructions: Cover with ABD pad Secondary Dressing: Conforming Guaze Roll-Medium 1 x Per Day/30 Days Discharge Instructions: Apply Conforming Stretch Guaze Bandage as directed Secured With: 579M Medipore H Soft Cloth Surgical Tape, 2x2 (in/yd) 1 x Per Day/30 Days Laboratory o Bacteria identified in Wound by Culture (MICRO) - right foot oooo LOINC Code: 4431-5 Enriquez, Chauntelle ([id number]) oooo Convenience Name: Wound culture routine Patient Medications Allergies: Tylenol PM, diazepam, latex, simvastatin, ciprofloxacin, levofloxacin, Omnicef, Insulins Notifications Medication  Indication Start End Augmentin 05/21/2021 DOSE 1 - oral 875 mg-125 mg tablet - 1 tablet oral BID x 10 days Electronic Signature(s) Signed: 05/21/2021 11:48:36 AM By: Kalman Shan DO Previous Signature: 05/21/2021 11:47:55 AM Version By: Kalman Shan DO Entered By: Kalman Shan on 05/21/2021 11:48:15 Castille, Denajah ([id number]) -------------------------------------------------------------------------------- Problem List Details Patient Name: [name] Date of Service: 05/21/2021 10:30 AM Medical Record Number: [id number] Patient Account  Number: [id number] Date of Birth/Sex: 06-Jan-1966 (56 y.o. 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 F) Treating RN: Levora Dredge Primary Care Provider: Laurance Flatten Other Clinician: Referring Provider: Laurance Flatten Treating Provider/Extender: Yaakov Guthrie in Treatment: 2 Subjective Chief Complaint Information obtained from Patient 05/07/2021; surgical wound dehiscence to the right great toe amputation site. History of Present Illness (HPI) 10/24/2018 on evaluation today patient presents for initial evaluation or clinic concerning issues that she has been having with lymphedema for quite some time. Unfortunately she has several wound openings at this point that secondary to her lymphedema/venous stasis are giving her trouble and leaking quite severely. She also has diabetes along with hypertension and stage III chronic kidney disease. Fortunately there is no signs of active infection at this time. She is going  to likely require debridement of the left leg ulcer upon evaluation today just based on what I am seeing. Fortunately there is no wound opening on the right. Overall the patient seems to be doing quite well and again there is no evidence of systemic infection which is good news. No fevers, chills, nausea, vomiting, or diarrhea. 10/31/2018 on evaluation today patient actually appears to be doing excellent in regard to her lower extremity ulcers. She has been tolerating the dressing changes without complication. Fortunately there is no signs of active infection at this time. She has tolerated 3 layer compression wrap without complication. 11/07/2018 upon evaluation today patient actually appears to be doing very well with regard to her left lower extremity ulcers. She has been tolerating the dressing changes without complication. Fortunately there is no signs of active infection. No fevers, chills, nausea, vomiting, or diarrhea. 11/21/2018 upon evaluation today patient appears to be doing quite well with regard to her lower extremity ulcers. In fact both areas seem to be showing signs of good improvement which is excellent. She is not having as much pain as she has in the past and again has a lot of healing compared to previous visits as well. 12/05/2018 on evaluation today patient presents for follow-up concerning her ongoing issues with her bilateral lower extremity ulcers. The good news is her right lower extremity is showing signs of completely healing at this time which is great news. Fortunately there is no evidence of active infection. On the left she has just a very small area still remaining that is  open at this time all in all she is very close to complete closure in my opinion. She does have compression stockings to wear at home. 12/12/2018 on evaluation today patient appears to be doing excellent in regard to her left lateral lower extremity ulcer. She has been tolerating the dressing changes  without complication. Fortunately there is no signs of active infection at this time. In fact this appears to be pretty much healed at this point although again she is not 100% today. No fevers, chills, nausea, vomiting, or diarrhea. 12/19/2018 on evaluation today patient actually appears to be doing excellent with regard to her lower extremity ulcer in fact this appears to be completely healed today which is all some. She has done extremely well with wound care measures. No fevers, chills, nausea, vomiting, or diarrhea. ------------------------------------ 11/01/19-Readmission to the clinic Patient presents with left leg pain, wounds posterior calf, onset about 4 weeks, denies any fevers chills or shakes, has not been using anything to these wounds, was given compression stockings at last discharge from the clinic but has not been able to use them as they rolled down and cause creases Patient's history significant for type 2 diabetes insulin requiring, A1c of 6.9 lately, hypertension, chronic pain 8/18; patient readmitted that the clinic last week. She has wounds on her left lateral posterior lower leg all of this in close juxtaposition i.e. a localized site. We've been using silver alginate under 3 layer compression apparently the wound surface area is better. She was wearing compression stockings from elastic therapy but says they were falling down. As she progresses more towards healing will need to address what we use in terms of compression stockings perhaps external compression garments 11/17/2019 on evaluation today patient appears to be doing well with regard to her legs currently. She does have 2 wounds which are actually measuring much better the more posterior is actually doing very well which I am pleased with the other though smaller is not quite a small but nonetheless on the lateral aspect does seem to be improving. 11/23/2019 on evaluation today patient appears to be doing well in  regard to her wounds. In fact the wound on the posterior aspect of her leg appears healed the lateral aspect is still open but extremely small. 9/15; this is a patient with chronic venous insufficiency and secondary lymphedema. She did not keep her clinic appointment last week and hence the left leg is a lot more swollen since she had to take the wrap off at some point. She has a small weeping area on the left lateral lower leg. She has probably a juxta lite stocking for the left leg I have asked her to bring that in when she comes to her clinic appointment next week at which time she should be healed 10/6; this is a patient with chronic venous insufficiency and secondary lymphedema left greater than right. Skin changes of chronic lymphedema especially in the left leg. She has not been here in 3 weeks. She took the wrap off 2 weeks ago. She has some very old 20/30 stockings from elastic therapy in [place] she has been using. Fortunately her leg is closed. She also has a single [place] wrap for the left leg Readmission: 06/18/2020 upon evaluation today patient presents for reevaluation here in the clinic. She is a long-term patient that we have seen intermittently Badman, Cynthya ([id number]) when she has had flareups of issues with her lower extremity secondary to chronic venous stasis. With that being said unfortunately  today she is having a significant issue with a wound which is actually quite significant over the right lower extremity. She did see dermatology they put Vaseline followed by [place] dressings on her unfortunately she is continuing to have significant issues however here with pain and in fact this wrap was extremely stuck to her upon evaluation today. Nonetheless I believe that she is going to require a little bit different approach to try to keep things from sticking and hopefully aid in getting this to heal more effectively and quickly. With that being said she does have a  history of chronic venous insufficiency, lymphedema, diabetes mellitus type 2, chronic kidney disease stage III, hypertension, and congestive heart failure. This started as an injury in mid February and has worsened over the past 2 weeks. 07/04/2020 upon evaluation today patient appears to be doing well with regard to her wound on the leg. Overall I am extremely pleased with where things stand and I do think that she is making excellent progress. There is no sign of infection right now which is also great news. I think that she is close to complete resolution. 07/09/20 upon evaluation today patient appears to be doing excellent in regard to her leg ulcer. She has been tolerating the dressing changes without complication and in general I am extremely pleased with where things stand today. There does not appear to be any signs of active infection at this time which is great news. 07/25/2020 upon evaluation today patient appears to be doing well with regard to her wound. She has been tolerating the dressing changes without complication. Fortunately there is no signs of active infection at this time and her wound appears to be completely healed. Readmission: 09/30/2020 upon evaluation today patient appears for reevaluation here in our clinic. She was discharged May 5 that unfortunately has started to have weeping and wounds on the left leg at this point. She tells me the right leg did weep some but nothing as significant as the left. With all that being said at this point the patient states that she figured it was best to come in here and she was having a lot of drainage and it was get all of her sheets and everything. She has been wearing her Velcro wraps sometimes but it does not sound like she is been wearing them all the time which I think is part of the reason why this reopened. She also unfortunately had a fall which was quite significant. This occurred I believe about a week or 2 ago. Otherwise  patient's past medical history really has not changed significantly. 10/07/2020 upon evaluation today patient appears to be doing better in regard to her wounds of the lower extremities. Fortunately there does not appear to be any signs of active infection at this time which is great news. I do not see any evidence of anything worsening but again she continues to have significant bilateral stage III lymphedema. This has been an ongoing issue. We actually have seen her this year pretty much from March through May of 2022. Following this she continued with compression at home following but then unfortunately reopened again. That was on July 11. With that being said this is an ongoing and recurrent issue for which honestly have seen her over the past 2 years pretty much consistently. Nonetheless we have had Velcro compression wraps that she uses at home and despite this she continues to have frequent and recurrent ulcerations. 7/27; patient presents for 1 week follow-up.  She has no issues or complaints today. She denies signs of infection. 10/21/2020 upon evaluation today patient's leg actually appears to be doing decently well on the left there is nothing open on the right at all and I am pleased in that regard. On the left side I do think that she still has some areas of weeping I believe the compression wrap is still the best way to go although I am hopeful that we are headed in the right direction as far as getting this area to heal and silver. Upon inspection patient's wound bed actually showed signs of good granulation epithelization at this point. There does not appear to be any evidence of active infection which is great news and overall I am extremely pleased with where things stand. 11/04/2020 upon evaluation today patient appears to be doing well with regard to her left leg. In fact this is getting very close to complete granulation and epithelization at this point I am very pleased with where we  stand. I do not see any signs of infection currently. 11/18/2020 upon evaluation today patient appears to be doing well in regard to her wound. She is in fact doing extremely well considering the fact we did need to see her last week she was apparently sick and missed her appointment. She is therefore had the wrap on for 2 weeks. Fortunately there is no signs of active infection at this time. No fevers, chills, nausea, vomiting, or diarrhea. 11/28/2020 upon evaluation today patient appears to be doing well with regard to her legs. Things are healing nicely and overall I do not see any signs of infection at this time which is great news. No fevers, chills, nausea, vomiting, or diarrhea. With that being said she still has a couple areas that are open and weeping currently. 12/10/2020 upon evaluation today patient appears to be doing well with regard to her wound. She has been tolerating the dressing changes without complication. Fortunately there does not appear to be any signs of active infection at this time. No fevers, chills, nausea, vomiting, or diarrhea. 10/11; patient comes in today with really excellent edema control. Everything appears to be closed in clinic occluding the left lateral. The area on the right anterior has some eschar on the top I did not remove this. She has dry scaly skin which might benefit from ammonium lactate but for now I just discharged her with a moisturizer. The patient is also apparently fractured her right great toe. We gave her some stockinette to wear under her juxta lite on that side Readmission 05/07/2021 Ms. 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 There was a revision of the site on  03/13/2021. She has been using gauze to the wound site.She reports pain to the medial aspect with drainage. Home health was changing the dressing however wound care visits have expired. She is trying to do the dressing change on her own but reports having trouble with this. She currently denies fever/chills. 2/22; patient presents for follow-up. She reports taking Augmentin for the past week. She reports heartburn from this and would like the solution version. She is on doxycycline prescribed by another provider for the next few months. She currently denies systemic signs of infection.  She is scheduled for her MRI On 2/27. She has home health and is changing the dressing with Hydrofera Blue. She is unable to do this daily. She has a family member present to help watch how to do the dressing. 3/1; patient presents for follow-up. She was able to obtain the solution for Augmentin from her pharmacy however there was a short supply and this is on backorder currently. She continues to take doxycycline as prescribed. She reports improvement in her foot pain. She had her MRI completed. She states she has not scheduled an appointment with vein and vascular. She is doing [employer] with dressing changes. She denies systemic signs of infection. Everding, Corryn ([id number]) Objective Constitutional Vitals Time Taken: 10:45 AM, Temperature: 98.2 F, Pulse: 87 bpm, Respiratory Rate: 18 breaths/min, Blood Pressure: 161/80 mmHg. General Notes: Right foot: To the great toe amputation site there is dehiscence. There is nonviable tissue at the opening. To the medial aspect there are 2 small openings with yellow thick drainage. Mild erythema present and no tenderness to palpation. Difficult to palpate pedal pulses. Integumentary (Hair, Skin) Wound #10 status is Open. Original cause of wound was Surgical Injury. The date acquired was: 03/13/2021. The wound has been in treatment 2 weeks. The wound is located on the  [place]. The wound measures 0.4cm length x 0.4cm width x 0.1cm depth; 0.126cm^2 area and 0.013cm^3 volume. There is Fat Layer (Subcutaneous Tissue) exposed. There is no tunneling or undermining noted. There is a medium amount of serosanguineous drainage noted. There is small (1-33%) granulation within the wound bed. There is a large (67-100%) amount of necrotic tissue within the wound bed including Adherent Slough. Wound #9 status is Open. Original cause of wound was Surgical Injury. The date acquired was: 03/13/2021. The wound has been in treatment 2 weeks. The wound is located on the [place]. The wound measures 1.6cm length x 4cm width x 0.5cm depth; 5.027cm^2 area and 2.513cm^3 volume. There is Fat Layer (Subcutaneous Tissue) exposed. There is no tunneling or undermining noted. There is a medium amount of serosanguineous drainage noted. There is no granulation within the wound bed. There is a large (67-100%) amount of necrotic tissue within the wound bed including Adherent Slough. Assessment Active Problems ICD-10 Type 2 diabetes mellitus with foot ulcer Disruption of wound, unspecified, initial encounter Non-pressure chronic ulcer of other part of right foot with fat layer exposed Osteomyelitis, unspecified Patient's wound is stable. She continues to take doxycycline and Augmentin. She reports improvement in pain. She still has yellow drainage and I obtained a culture on this. She had an MRI completed on 2/27. It showed apparent cortical erosion of the distal medial greater than the distal lateral aspect of the first metatarsal with associated marrow edema indicating active/acute osteomyelitis. I recommended follow-up with her surgeon. At last clinic visit we contacted her vein and vascular office and they have reached out to the patient to schedule an interview. Unfortunately the patient has missed the call. We gave her the number to call and schedule this. I would like  to assure that there is adequate blood flow for wound healing. Due to continued improvement but still with drainage I recommended continuing Augmentin and doxycycline. Plan Follow-up Appointments: Return Appointment in 1 week. Nurse Visit as needed Home Health: [place]: - [place] for wound care. May utilize formulary equivalent dressing for wound treatment orders unless otherwise specified. Home Health Nurse may visit [facility] to address patient s wound  care needs. **Please direct any NON-WOUND related issues/requests for orders to patient's Primary Care Physician. **If current dressing causes regression in wound condition, may D/C ordered dressing product/s and apply Normal Saline Moist Dressing daily until next [place] or Other MD appointment. **Notify [facility] of regression in wound condition at [phone]. Other Home Health Orders/Instructions: - please change dressing 3 times a week Bathing/ Shower/ Hygiene: Wash wounds with antibacterial soap and water. May shower with wound dressing protected with water repellent cover or cast protector. No tub bath. Off-Loading: Knack, Dorothyann ([id number]) Other: - Try your best to stay off wound as best as you can Medications-Please add to medication list.: P.O. Antibiotics - PIck up and take as directed Laboratory ordered were: Wound culture routine - right foot WOUND #10: - Foot Wound Laterality: Plantar, Right Primary Dressing: Drawtex Hydroconductive Wound Dressing, 3.5x12 (in/in) 1 x Per Day/30 Days Discharge Instructions: placed on plantar part of foot for drainage Secondary Dressing: Conforming Guaze Roll-Medium 1 x Per Day/30 Days Discharge Instructions: Apply Conforming Stretch Guaze Bandage as directed Secondary Dressing: Gauze 1 x Per Day/30 Days Discharge Instructions: dry Secured With: 729M Medipore H Soft Cloth Surgical Tape, 2x2 (in/yd) 1 x Per Day/30 Days WOUND #9:  - Foot Wound Laterality: Right, Midline Primary Dressing: Hydrofera Blue Ready Transfer Foam, 4x5 (in/in) 1 x Per Day/30 Days Discharge Instructions: Apply Hydrofera Blue Ready to wound bed as directed Secondary Dressing: ABD Pad 5x9 (in/in) 1 x Per Day/30 Days Discharge Instructions: Cover with ABD pad Secondary Dressing: Conforming Guaze Roll-Medium 1 x Per Day/30 Days Discharge Instructions: Apply Conforming Stretch Guaze Bandage as directed Secured With: 729M Medipore H Soft Cloth Surgical Tape, 2x2 (in/yd) 1 x Per Day/30 Days 1. Hydrofera Blue 2. Augmentin and doxycycline 3. Offloading 4. Follow-up with vein and vascular 5. Follow-up with surgery Electronic Signature(s) Signed: 05/21/2021 11:48:36 AM By: Kalman Shan DO Entered By: Kalman Shan on 05/21/2021 11:46:25 Heinle, [name] ([id number]) -------------------------------------------------------------------------------- SuperBill Details Patient Name: [name] Date of Service: 05/21/2021 Medical Record Number: [id number] Patient Account Number: [id number] Date of Birth/Sex: 1965-08-10 (55 y.o. F) Treating RN: Levora Dredge Primary Care Provider: Laurance Flatten Other Clinician: Referring Provider: Laurance Flatten Treating Provider/Extender: Yaakov Guthrie in Treatment: 2 Diagnosis Coding ICD-10 Codes Code Description E11.621 Type 2 diabetes mellitus with foot ulcer T81.30XA Disruption of wound, unspecified, initial encounter L97.512 Non-pressure chronic ulcer of other part of right foot with fat layer exposed M86.9 Osteomyelitis, unspecified Physician Procedures CPT4 Code: [id number] Description: 79024 - WC PHYS LEVEL 4 - EST PT Modifier: Quantity: 1 CPT4 Code: Description: ICD-10 Diagnosis Description E11.621 Type 2 diabetes mellitus with foot ulcer T81.30XA Disruption of wound, unspecified, initial encounter L97.512 Non-pressure chronic ulcer of other part of right foot with fat layer e M86.9 Osteomyelitis,   unspecified Modifier: xposed Quantity: Electronic Signature(s) Signed: 05/21/2021 11:48:36 AM By: Kalman Shan DO Entered By: Kalman Shan on 05/21/2021 11:46:39

## 2021-05-21 NOTE — Progress Notes (Addendum)
MAHATI, VAJDA ([id number]) Visit Report for 05/21/2021 Arrival Information Details Patient Name: [name], [name] Date of Service: 05/21/2021 10:30 AM Medical Record Number: [id number] Patient Account Number: [id number] Date of Birth/Sex: 12-09-1965 (56 y.o. F) Treating RN: Levora Dredge Primary Care Leanda Padmore: Laurance Flatten Other Clinician: Referring Lylla Eifler: Laurance Flatten Treating Shavone Nevers/Extender: Yaakov Guthrie in Treatment: 2 Visit Information History Since Last Visit Added or deleted any medications: No Patient Arrived: [name] Any new allergies or adverse reactions: No Arrival Time: 10:42 Had a fall or experienced change in No Accompanied By: self activities of daily living that may affect Transfer Assistance: None risk of falls: Patient Identification Verified: Yes Hospitalized since last visit: No Secondary Verification Process Completed: Yes Has Dressing in Place as Prescribed: Yes Patient Requires Transmission-Based Precautions: No Pain Present Now: Yes Patient Has Alerts: Yes Patient Alerts: diabetic Electronic Signature(s) Signed: 05/21/2021 3:02:55 PM By: Levora Dredge Entered By: Levora Dredge on 05/21/2021 10:45:24 [name], [name] ([id number]) -------------------------------------------------------------------------------- Clinic Level of Care Assessment Details Patient Name: [name] Date of Service: 05/21/2021 10:30 AM Medical Record Number: [id number] Patient Account Number: [id number] Date of Birth/Sex: 11/17/1965 (56 y.o. F) Treating RN: Levora Dredge Primary Care Clariza Sickman: Laurance Flatten Other Clinician: Referring Reba Hulett: Laurance Flatten Treating Lyall [name]/Extender: Yaakov Guthrie in Treatment: 2 Clinic Level of Care Assessment Items TOOL 4 Quantity Score []  - Use when only an EandM is performed on FOLLOW-UP visit 0 ASSESSMENTS - Nursing Assessment / Reassessment []  - Reassessment of Co-morbidities (includes updates in patient status) 0 []  -  0 Reassessment of Adherence to Treatment Plan ASSESSMENTS - Wound and Skin Assessment / Reassessment X - Simple Wound Assessment / Reassessment - one wound 1 5 []  - 0 Complex Wound Assessment / Reassessment - multiple wounds []  - 0 Dermatologic / Skin Assessment (not related to wound area) ASSESSMENTS - Focused Assessment []  - Circumferential Edema Measurements - multi extremities 0 []  - 0 Nutritional Assessment / Counseling / Intervention []  - 0 Lower Extremity Assessment (monofilament, tuning fork, pulses) []  - 0 Peripheral Arterial Disease Assessment (using hand held doppler) ASSESSMENTS - Ostomy and/or Continence Assessment and Care []  - Incontinence Assessment and Management 0 []  - 0 Ostomy Care Assessment and Management (repouching, etc.) PROCESS - Coordination of Care X - Simple Patient / Family Education for ongoing care 1 15 []  - 0 Complex (extensive) Patient / Family Education for ongoing care []  - 0 Staff obtains Programmer, systems, Records, Test Results / Process Orders []  - 0 Staff telephones HHA, Nursing Homes / Clarify orders / etc []  - 0 Routine Transfer to another Facility (non-emergent condition) []  - 0 Routine Hospital Admission (non-emergent condition) []  - 0 New Admissions / Biomedical engineer / Ordering NPWT, Apligraf, etc. []  - 0 Emergency Hospital Admission (emergent condition) X- 1 10 Simple Discharge Coordination []  - 0 Complex (extensive) Discharge Coordination PROCESS - Special Needs []  - Pediatric / Minor Patient Management 0 []  - 0 Isolation Patient Management []  - 0 Hearing / Language / Visual special needs []  - 0 Assessment of Community assistance (transportation, D/C planning, etc.) []  - 0 Additional assistance / Altered mentation []  - 0 Support Surface(s) Assessment (bed, cushion, seat, etc.) INTERVENTIONS - Wound Cleansing / Measurement Kolinski, Keirah ([id number]) X- 1 5 Simple Wound Cleansing - one wound []  - 0 Complex Wound  Cleansing - multiple wounds X- 1 5 Wound Imaging (photographs - any number of wounds) []  - 0 Wound Tracing (instead of photographs) X- 1 5 Simple Wound Measurement - one wound []  -  0 Complex Wound Measurement - multiple wounds INTERVENTIONS - Wound Dressings X - Small Wound Dressing one or multiple wounds 1 10 []  - 0 Medium Wound Dressing one or multiple wounds []  - 0 Large Wound Dressing one or multiple wounds []  - 0 Application of Medications - topical []  - 0 Application of Medications - injection INTERVENTIONS - Miscellaneous []  - External ear exam 0 []  - 0 Specimen Collection (cultures, biopsies, blood, body fluids, etc.) []  - 0 Specimen(s) / Culture(s) sent or taken to Lab for analysis []  - 0 Patient Transfer (multiple staff / Civil Service fast streamer / Similar devices) []  - 0 Simple Staple / Suture removal (25 or less) []  - 0 Complex Staple / Suture removal (26 or more) []  - 0 Hypo / Hyperglycemic Management (close monitor of Blood Glucose) []  - 0 Ankle / Brachial Index (ABI) - do not check if billed separately X- 1 5 Vital Signs Has the patient been seen at the hospital within the last three years: Yes Total Score: 60 Level Of Care: New/Established - Level 2 Electronic Signature(s) Signed: 05/22/2021 4:37:42 PM By: Levora Dredge Entered By: Levora Dredge on 05/22/2021 08:07:01 [name], [name] ([id number]) -------------------------------------------------------------------------------- Encounter Discharge Information Details Patient Name: [name] Date of Service: 05/21/2021 10:30 AM Medical Record Number: [id number] Patient Account Number: [id number] Date of Birth/Sex: [birth year] (56 y.o. F) Treating RN: Levora Dredge Primary Care Takyra Cantrall: Laurance Flatten Other Clinician: Referring Donyae Kohn: Laurance Flatten Treating Rayvon Brandvold/Extender: Yaakov Guthrie in Treatment: 2 Encounter Discharge Information Items Discharge Condition: Stable Ambulatory Status:  Cane Discharge Destination: Home Transportation: Private Auto Accompanied By: self Schedule Follow-up Appointment: Yes Clinical Summary of Care: Electronic Signature(s) Signed: 05/22/2021 8:08:14 AM By: Levora Dredge Entered By: Levora Dredge on 05/22/2021 08:08:14 [name], [name] ([id number]) -------------------------------------------------------------------------------- Lower Extremity Assessment Details Patient Name: [name] Date of Service: 05/21/2021 10:30 AM Medical Record Number: [id number] Patient Account Number: [id number] Date of Birth/Sex: October 13, 1965 (55 y.o. F) Treating RN: Levora Dredge Primary Care Halen Mossbarger: Laurance Flatten Other Clinician: Referring Johneric Mcfadden: Laurance Flatten Treating Lamisha Roussell/Extender: Yaakov Guthrie in Treatment: 2 Edema Assessment Assessed: [Left: No] [Right: No] Edema: [Left: Ye] [Right: s] Calf Left: Right: Point of Measurement: 35 cm From Medial Instep 45.5 cm Ankle Left: Right: Point of Measurement: 10 cm From Medial Instep 25.6 cm Vascular Assessment Pulses: Dorsalis Pedis Palpable: [Left:Yes] Electronic Signature(s) Signed: 05/21/2021 3:02:55 PM By: Levora Dredge Entered By: Levora Dredge on 05/21/2021 11:01:33 [name], [name] ([id number]) -------------------------------------------------------------------------------- Multi Wound Chart Details Patient Name: [name] Date of Service: 05/21/2021 10:30 AM Medical Record Number: [id number] Patient Account Number: [id number] Date of Birth/Sex: [birth year] (55 y.o. F) Treating RN: Levora Dredge Primary Care Shakiyah Cirilo: Laurance Flatten Other Clinician: Referring Audie Wieser: Laurance Flatten Treating Aricela Bertagnolli/Extender: Yaakov Guthrie in Treatment: 2 Vital Signs Height(in): Pulse(bpm): 15 Weight(lbs): Blood Pressure(mmHg): 161/80 Body Mass Index(BMI): Temperature(F): 98.2 Respiratory Rate(breaths/min): 18 Photos: [N/A:N/A] Wound Location: Right, Plantar Foot Right,  Midline Foot N/A Wounding Event: Surgical Injury Surgical Injury N/A Primary Etiology: Diabetic Wound/Ulcer of the Lower Open Surgical Wound N/A Extremity Comorbid History: Sleep Apnea, Congestive Heart Sleep Apnea, Congestive Heart N/A Failure, Coronary Artery Disease, Failure, Coronary Artery Disease, Hypertension, Myocardial Infarction, Hypertension, Myocardial Infarction, Peripheral Venous Disease, Type II Peripheral Venous Disease, Type II Diabetes, End Stage Renal Disease, Diabetes, End Stage Renal Disease, Gout, Neuropathy Gout, Neuropathy Date Acquired: 03/13/2021 03/13/2021 N/A Weeks of Treatment: 2 2 N/A Wound Status: Open Open N/A Wound Recurrence: No No N/A Clustered Wound: Yes No N/A Clustered Quantity: 2 N/A N/A Pending  Amputation on Yes Yes N/A Presentation: Measurements L x W x D (cm) 0.4x0.4x0.1 1.6x4x0.5 N/A Area (cm) : 0.126 5.027 N/A Volume (cm) : 0.013 2.513 N/A % Reduction in Area: 96.50% 5.20% N/A % Reduction in Volume: 96.40% 5.20% N/A Classification: Grade 2 Unclassifiable N/A Exudate Amount: Medium Medium N/A Exudate Type: Serosanguineous Serosanguineous N/A Exudate Color: red, brown red, brown N/A Granulation Amount: Small (1-33%) None Present (0%) N/A Necrotic Amount: Large (67-100%) Large (67-100%) N/A Exposed Structures: Fat Layer (Subcutaneous Tissue): Fat Layer (Subcutaneous Tissue): N/A Yes Yes Epithelialization: None None N/A Treatment Notes Electronic Signature(s) Signed: 05/21/2021 3:02:55 PM By: Levora Dredge Entered By: Levora Dredge on 05/21/2021 11:04:18 [name], [name] ([id number]) [name], [name] ([id number]) -------------------------------------------------------------------------------- Multi-Disciplinary Care Plan Details Patient Name: [name] Date of Service: 05/21/2021 10:30 AM Medical Record Number: [id number] Patient Account Number: [id number] Date of Birth/Sex: 01-02-1966 (55 y.o. F) Treating RN: Levora Dredge Primary Care Quintella Mura: Laurance Flatten Other Clinician: Referring Kyen Taite: Laurance Flatten Treating Raynaldo Falco/Extender: Yaakov Guthrie in Treatment: 2 Active Inactive Orientation to the Wound Care Program Nursing Diagnoses: Knowledge deficit related to the wound healing center program Goals: Patient/caregiver will verbalize understanding of the [place] Program Date Initiated: 05/07/2021 Target Resolution Date: 05/07/2021 Goal Status: Active Interventions: Provide education on orientation to the wound center Notes: Wound/Skin Impairment Nursing Diagnoses: Impaired tissue integrity Knowledge deficit related to ulceration/compromised skin integrity Goals: Ulcer/skin breakdown will have a volume reduction of 30% by week 4 Date Initiated: 05/07/2021 Target Resolution Date: 06/04/2021 Goal Status: Active Ulcer/skin breakdown will have a volume reduction of 50% by week 8 Date Initiated: 05/07/2021 Target Resolution Date: 07/02/2021 Goal Status: Active Ulcer/skin breakdown will have a volume reduction of 80% by week 12 Date Initiated: 05/07/2021 Target Resolution Date: 07/30/2021 Goal Status: Active Ulcer/skin breakdown will heal within 14 weeks Date Initiated: 05/07/2021 Target Resolution Date: 08/13/2021 Goal Status: Active Interventions: Assess patient/caregiver ability to obtain necessary supplies Assess patient/caregiver ability to perform ulcer/skin care regimen upon admission and as needed Assess ulceration(s) every visit Provide education on ulcer and skin care Notes: Electronic Signature(s) Signed: 05/21/2021 3:02:55 PM By: Levora Dredge Entered By: Levora Dredge on 05/21/2021 11:04:08 [place], [place] ([id number]) -------------------------------------------------------------------------------- Pain Assessment Details Patient Name: [name] Date of Service: 05/21/2021 10:30 AM Medical Record Number: [id number] Patient Account Number:  [id number] Date of Birth/Sex: August 24, 1965 (56 y.o. F) Treating RN: Levora Dredge Primary Care Kinser Fellman: Laurance Flatten Other Clinician: Referring Siddhant Hashemi: Laurance Flatten Treating Yonas Bunda/Extender: Yaakov Guthrie in Treatment: 2 Active Problems Location of Pain Severity and Description of Pain Patient Has Paino Yes Site Locations Rate the pain. Current Pain Level: 5 Pain Management and Medication Current Pain Management: Electronic Signature(s) Signed: 05/21/2021 3:02:55 PM By: Levora Dredge Entered By: Levora Dredge on 05/21/2021 10:48:10 [place], [name] ([id number]) -------------------------------------------------------------------------------- Patient/Caregiver Education Details Patient Name: [name] Date of Service: 05/21/2021 10:30 AM Medical Record Number: [id number] Patient Account Number: [id number] Date of Birth/Gender: 11-12-1965 (55 y.o. F) Treating RN: Levora Dredge Primary Care Physician: Laurance Flatten Other Clinician: Referring Physician: Laurance Flatten Treating Physician/Extender: Yaakov Guthrie in Treatment: 2 Education Assessment Education Provided To: Patient Education Topics Provided Wound/Skin Impairment: Handouts: Caring for Your Ulcer Methods: Explain/Verbal Responses: State content correctly Electronic Signature(s) Signed: 05/22/2021 4:37:42 PM By: Levora Dredge Entered By: Levora Dredge on 05/22/2021 08:07:32 [name], [name] ([id number]) -------------------------------------------------------------------------------- Wound Assessment Details Patient Name: [name] Date of Service: 05/21/2021 10:30 AM Medical Record Number: [id number] Patient Account Number: [id number] Date of Birth/Sex: May 24, 1965 (55 y.o. F) Treating RN:  Levora Dredge Primary Care Ruie Sendejo: Laurance Flatten Other Clinician: Referring Cyndee Giammarco: Laurance Flatten Treating Kinzleigh Kandler/Extender: Yaakov Guthrie in Treatment: 2 Wound Status Wound Number: 10 Primary  Diabetic Wound/Ulcer of the Lower Extremity Etiology: Wound Location: Right, Plantar Foot Wound Open Wounding Event: Surgical Injury Status: Date Acquired: 03/13/2021 Comorbid Sleep Apnea, Congestive Heart Failure, Coronary Artery Weeks Of Treatment: 2 History: Disease, Hypertension, Myocardial Infarction, Peripheral Clustered Wound: Yes Venous Disease, Type II Diabetes, End Stage Renal Pending Amputation On Presentation Disease, Gout, Neuropathy Photos Wound Measurements Length: (cm) 0.4 Width: (cm) 0.4 Depth: (cm) 0.1 Clustered Quantity: 2 Area: (cm) 0.126 Volume: (cm) 0.013 % Reduction in Area: 96.5% % Reduction in Volume: 96.4% Epithelialization: None Tunneling: No Undermining: No Wound Description Classification: Grade 2 Exudate Amount: Medium Exudate Type: Serosanguineous Exudate Color: red, brown Foul Odor After Cleansing: No Slough/Fibrino Yes Wound Bed Granulation Amount: Small (1-33%) Exposed Structure Necrotic Amount: Large (67-100%) Fat Layer (Subcutaneous Tissue) Exposed: Yes Necrotic Quality: Adherent Slough Treatment Notes Wound #10 (Foot) Wound Laterality: Plantar, Right Cleanser Peri-Wound Care Topical Primary Dressing Drawtex Hydroconductive Wound Dressing, 3.5x12 (in/in) [name], [name] ([id number]) Discharge Instruction: placed on plantar part of foot for drainage Secondary Dressing [place] Roll-Medium Discharge Instruction: [place] as directed Gauze Discharge Instruction: dry Secured With 52M Medipore H Soft Cloth Surgical Tape, 2x2 (in/yd) Compression Wrap Compression Stockings Add-Ons Electronic Signature(s) Signed: 05/21/2021 3:02:55 PM By: Levora Dredge Entered By: Levora Dredge on 05/21/2021 11:00:13 [name], [name] ([id number]) -------------------------------------------------------------------------------- Wound Assessment Details Patient Name: [name] Date of Service: 05/21/2021  10:30 AM Medical Record Number: [id number] Patient Account Number: [id number] Date of Birth/Sex: 1966/01/09 (56 y.o. F) Treating RN: Levora Dredge Primary Care Maudie Shingledecker: Laurance Flatten Other Clinician: Referring Addalynn Kumari: Laurance Flatten Treating Areej Tayler/Extender: Yaakov Guthrie in Treatment: 2 Wound Status Wound Number: 9 Primary Open Surgical Wound Etiology: Wound Location: Right, Midline Foot Wound Open Wounding Event: Surgical Injury Status: Date Acquired: 03/13/2021 Notes: amputation of right great toe Weeks Of Treatment: 2 Comorbid Sleep Apnea, Congestive Heart Failure, Coronary Artery Clustered Wound: No History: Disease, Hypertension, Myocardial Infarction, Peripheral Pending Amputation On Presentation Venous Disease, Type II Diabetes, End Stage Renal Disease, Gout, Neuropathy Photos Wound Measurements Length: (cm) 1.6 % Width: (cm) 4 % Depth: (cm) 0.5 E Area: (cm) 5.027 Volume: (cm) 2.513 Reduction in Area: 5.2% Reduction in Volume: 5.2% pithelialization: None Tunneling: No Undermining: No Wound Description Classification: Unclassifiable Exudate Amount: Medium Exudate Type: Serosanguineous Exudate Color: red, brown Foul Odor After Cleansing: No Slough/Fibrino Yes Wound Bed Granulation Amount: None Present (0%) Exposed Structure Necrotic Amount: Large (67-100%) Fat Layer (Subcutaneous Tissue) Exposed: Yes Necrotic Quality: Adherent Slough Treatment Notes Wound #9 (Foot) Wound Laterality: Right, Midline Cleanser Peri-Wound Care Topical Primary Dressing Hydrofera Blue Ready Transfer Foam, 4x5 (in/in) [name], [name] ([id number]) Discharge Instruction: Apply Hydrofera Blue Ready to wound bed as directed Secondary Dressing ABD Pad 5x9 (in/in) Discharge Instruction: Cover with ABD pad [place] Roll-Medium Discharge Instruction: [place] as directed Secured With 52M Medipore H Soft Cloth Surgical Tape, 2x2  (in/yd) Compression Wrap Compression Stockings Add-Ons Electronic Signature(s) Signed: 05/21/2021 3:02:55 PM By: Levora Dredge Entered By: Levora Dredge on 05/21/2021 11:01:01 [name], [name] ([id number]) -------------------------------------------------------------------------------- Vitals Details Patient Name: [name] Date of Service: 05/21/2021 10:30 AM Medical Record Number: [id number] Patient Account Number: [id number] Date of Birth/Sex: [birth year] (56 y.o. F) Treating RN: Levora Dredge Primary Care Naseer Hearn: Laurance Flatten Other Clinician: Referring Karman Veney: Laurance Flatten Treating Evann Erazo/Extender: Yaakov Guthrie in Treatment: 2  Vital Signs Time Taken: 10:45 Temperature (F): 98.2 Pulse (bpm): 87 Respiratory Rate (breaths/min): 18 Blood Pressure (mmHg): 161/80 Reference Range: 80 - 120 mg / dl Electronic Signature(s) Signed: 05/21/2021 3:02:55 PM By: Levora Dredge Entered By: Levora Dredge on 05/21/2021 10:47:29

## 2021-05-24 LAB — AEROBIC CULTURE W GRAM STAIN (SUPERFICIAL SPECIMEN)

## 2021-05-28 ENCOUNTER — Encounter (HOSPITAL_BASED_OUTPATIENT_CLINIC_OR_DEPARTMENT_OTHER): Payer: Medicare Other | Admitting: Internal Medicine

## 2021-05-28 ENCOUNTER — Other Ambulatory Visit: Payer: Self-pay

## 2021-05-28 DIAGNOSIS — T8130XA Disruption of wound, unspecified, initial encounter: Secondary | ICD-10-CM | POA: Diagnosis not present

## 2021-05-28 DIAGNOSIS — M869 Osteomyelitis, unspecified: Secondary | ICD-10-CM | POA: Diagnosis not present

## 2021-05-28 DIAGNOSIS — L97512 Non-pressure chronic ulcer of other part of right foot with fat layer exposed: Secondary | ICD-10-CM

## 2021-05-28 DIAGNOSIS — E11621 Type 2 diabetes mellitus with foot ulcer: Secondary | ICD-10-CM | POA: Diagnosis not present

## 2021-05-28 DIAGNOSIS — E1169 Type 2 diabetes mellitus with other specified complication: Secondary | ICD-10-CM | POA: Diagnosis not present

## 2021-05-28 NOTE — Progress Notes (Addendum)
KERITH, SHERLEY ([id number]) Visit Report for 05/28/2021 Chief Complaint Document Details Patient Name: [name], [name] Date of Service: 05/28/2021 9:45 AM Medical Record Number: [id number] Patient Account Number: [id number] Date of Birth/Sex: Apr 01, 1965 (56 y.o. F) Treating RN: Levora Dredge Primary Care Provider: Laurance Flatten Other Clinician: Referring Provider: Laurance Flatten Treating Provider/Extender: Yaakov Guthrie in Treatment: 3 Information Obtained from: Patient Chief Complaint 05/07/2021; surgical wound dehiscence to the right great toe amputation site. Electronic Signature(s) Signed: 05/28/2021 11:05:51 AM By: Kalman Shan DO Entered By: Kalman Shan on 05/28/2021 10:57:29 [name], [name] ([id number]) -------------------------------------------------------------------------------- HPI Details Patient Name: [name] Date of Service: 05/28/2021 9:45 AM Medical Record Number: [id number] Patient Account Number: [id number] Date of Birth/Sex: 05/04/1965 (56 y.o. F) Treating RN: Levora Dredge Primary Care Provider: Laurance Flatten Other Clinician: Referring Provider: Laurance Flatten Treating Provider/Extender: Yaakov Guthrie in Treatment: 3 History of Present Illness HPI Description: 10/24/2018 on evaluation today patient presents for initial evaluation or clinic concerning issues that she has been having with lymphedema for quite some time. Unfortunately she has several wound openings at this point that secondary to her lymphedema/venous stasis are giving her trouble and leaking quite severely. She also has diabetes along with hypertension and stage III chronic kidney disease. Fortunately there is no signs of active infection at this time. She is going to likely require debridement of the left leg ulcer upon evaluation today just based on what I am seeing. Fortunately there is no wound opening on the right. Overall the patient seems to be doing quite well and again there  is no evidence of systemic infection which is good news. No fevers, chills, nausea, vomiting, or diarrhea. 10/31/2018 on evaluation today patient actually appears to be doing excellent in regard to her lower extremity ulcers. She has been tolerating the dressing changes without complication. Fortunately there is no signs of active infection at this time. She has tolerated 3 layer compression wrap without complication. 11/07/2018 upon evaluation today patient actually appears to be doing very well with regard to her left lower extremity ulcers. She has been tolerating the dressing changes without complication. Fortunately there is no signs of active infection. No fevers, chills, nausea, vomiting, or diarrhea. 11/21/2018 upon evaluation today patient appears to be doing quite well with regard to her lower extremity ulcers. In fact both areas seem to be showing signs of good improvement which is excellent. She is not having as much pain as she has in the past and again has a lot of healing compared to previous visits as well. 12/05/2018 on evaluation today patient presents for follow-up concerning her ongoing issues with her bilateral lower extremity ulcers. The good news is her right lower extremity is showing signs of completely healing at this time which is great news. Fortunately there is no evidence of active infection. On the left she has just a very small area still remaining that is open at this time all in all she is very close to complete closure in my opinion. She does have compression stockings to wear at home. 12/12/2018 on evaluation today patient appears to be doing excellent in regard to her left lateral lower extremity ulcer. She has been tolerating the dressing changes without complication. Fortunately there is no signs of active infection at this time. In fact this appears to be pretty much healed at this point although again she is not 100% today. No fevers, chills, nausea, vomiting, or  diarrhea. 12/19/2018 on evaluation today patient actually appears to be doing excellent with  regard to her lower extremity ulcer in fact this appears to be completely healed today which is all some. She has done extremely well with wound care measures. No fevers, chills, nausea, vomiting, or diarrhea. ------------------------------------ 11/01/19-Readmission to the clinic Patient presents with left leg pain, wounds posterior calf, onset about 4 weeks, denies any fevers chills or shakes, has not been using anything to these wounds, was given compression stockings at last discharge from the clinic but has not been able to use them as they rolled down and cause creases Patient's history significant for type 2 diabetes insulin requiring, A1c of 6.9 lately, hypertension, chronic pain 8/18; patient readmitted that the clinic last week. She has wounds on her left lateral posterior lower leg all of this in close juxtaposition i.e. a localized site. We've been using silver alginate under 3 layer compression apparently the wound surface area is better. She was wearing compression stockings from [facility] therapy but says they were falling down. As she progresses more towards healing will need to address what we use in terms of compression stockings perhaps external compression garments 11/17/2019 on evaluation today patient appears to be doing well with regard to her legs currently. She does have 2 wounds which are actually measuring much better the more posterior is actually doing very well which I am pleased with the other though smaller is not quite a small but nonetheless on the lateral aspect does seem to be improving. 11/23/2019 on evaluation today patient appears to be doing well in regard to her wounds. In fact the wound on the posterior aspect of her leg appears healed the lateral aspect is still open but extremely small. 9/15; this is a patient with chronic venous insufficiency and secondary lymphedema.  She did not keep her clinic appointment last week and hence the left leg is a lot more swollen since she had to take the wrap off at some point. She has a small weeping area on the left lateral lower leg. She has probably a juxta lite stocking for the left leg I have asked her to bring that in when she comes to her clinic appointment next week at which time she should be healed 10/6; this is a patient with chronic venous insufficiency and secondary lymphedema left greater than right. Skin changes of chronic lymphedema especially in the left leg. She has not been here in 3 weeks. She took the wrap off 2 weeks ago. She has some very old 20/30 stockings from elastic therapy in [place] she has been using. Fortunately her leg is closed. She also has a single [place] wrap for the left leg Readmission: 06/18/2020 upon evaluation today patient presents for reevaluation here in the clinic. She is a long-term patient that we have seen intermittently when she has had flareups of issues with her lower extremity secondary to chronic venous stasis. With that being said unfortunately today she is having a significant issue with a wound which is actually quite significant over the right lower extremity. She did see dermatology they put Vaseline followed by [place] dressings on her unfortunately she is continuing to have significant issues however here with pain and in fact this wrap was extremely stuck to her upon evaluation today. Nonetheless I believe that she is going to require a little bit different approach to try to keep things from sticking and hopefully aid in getting this to heal more effectively and quickly. With that being said she does have a history of [name], [name] ([id number]) chronic venous  insufficiency, lymphedema, diabetes mellitus type 2, chronic kidney disease stage III, hypertension, and congestive heart failure. This started as an injury in mid February and has worsened over the past 2  weeks. 07/04/2020 upon evaluation today patient appears to be doing well with regard to her wound on the leg. Overall I am extremely pleased with where things stand and I do think that she is making excellent progress. There is no sign of infection right now which is also great news. I think that she is close to complete resolution. 07/09/20 upon evaluation today patient appears to be doing excellent in regard to her leg ulcer. She has been tolerating the dressing changes without complication and in general I am extremely pleased with where things stand today. There does not appear to be any signs of active infection at this time which is great news. 07/25/2020 upon evaluation today patient appears to be doing well with regard to her wound. She has been tolerating the dressing changes without complication. Fortunately there is no signs of active infection at this time and her wound appears to be completely healed. Readmission: 09/30/2020 upon evaluation today patient appears for reevaluation here in our clinic. She was discharged May 5 that unfortunately has started to have weeping and wounds on the left leg at this point. She tells me the right leg did weep some but nothing as significant as the left. With all that being said at this point the patient states that she figured it was best to come in here and she was having a lot of drainage and it was get all of her sheets and everything. She has been wearing her Velcro wraps sometimes but it does not sound like she is been wearing them all the time which I think is part of the reason why this reopened. She also unfortunately had a fall which was quite significant. This occurred I believe about a week or 2 ago. Otherwise patient's past medical history really has not changed significantly. 10/07/2020 upon evaluation today patient appears to be doing better in regard to her wounds of the lower extremities. Fortunately there does not appear to be any signs of  active infection at this time which is great news. I do not see any evidence of anything worsening but again she continues to have significant bilateral stage III lymphedema. This has been an ongoing issue. We actually have seen her this year pretty much from March through May of 2022. Following this she continued with compression at home following but then unfortunately reopened again. That was on July 11. With that being said this is an ongoing and recurrent issue for which honestly have seen her over the past 2 years pretty much consistently. Nonetheless we have had Velcro compression wraps that she uses at home and despite this she continues to have frequent and recurrent ulcerations. 7/27; patient presents for 1 week follow-up. She has no issues or complaints today. She denies signs of infection. 10/21/2020 upon evaluation today patient's leg actually appears to be doing decently well on the left there is nothing open on the right at all and I am pleased in that regard. On the left side I do think that she still has some areas of weeping I believe the compression wrap is still the best way to go although I am hopeful that we are headed in the right direction as far as getting this area to heal and silver. Upon inspection patient's wound bed actually showed signs of good granulation  epithelization at this point. There does not appear to be any evidence of active infection which is great news and overall I am extremely pleased with where things stand. 11/04/2020 upon evaluation today patient appears to be doing well with regard to her left leg. In fact this is getting very close to complete granulation and epithelization at this point I am very pleased with where we stand. I do not see any signs of infection currently. 11/18/2020 upon evaluation today patient appears to be doing well in regard to her wound. She is in fact doing extremely well considering the fact we did need to see her last week she  was apparently sick and missed her appointment. She is therefore had the wrap on for 2 weeks. Fortunately there is no signs of active infection at this time. No fevers, chills, nausea, vomiting, or diarrhea. 11/28/2020 upon evaluation today patient appears to be doing well with regard to her legs. Things are healing nicely and overall I do not see any signs of infection at this time which is great news. No fevers, chills, nausea, vomiting, or diarrhea. With that being said she still has a couple areas that are open and weeping currently. 12/10/2020 upon evaluation today patient appears to be doing well with regard to her wound. She has been tolerating the dressing changes without complication. Fortunately there does not appear to be any signs of active infection at this time. No fevers, chills, nausea, vomiting, or diarrhea. 10/11; patient comes in today with really excellent edema control. Everything appears to be closed in clinic occluding the left lateral. The area on the right anterior has some eschar on the top I did not remove this. She has dry scaly skin which might benefit from ammonium lactate but for now I just discharged her with a moisturizer. The patient is also apparently fractured her right great toe. 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 There was a revision of the site on 03/13/2021. She has been using gauze to the wound site.She reports pain to the medial aspect with drainage. Home health was changing the dressing however wound care visits have expired. She is trying to do the dressing change on her own but  reports having trouble with this. She currently denies fever/chills. 2/22; patient presents for follow-up. She reports taking Augmentin for the past week. She reports heartburn from this and would like the solution version. She is on doxycycline prescribed by another provider for the next few months. She currently denies systemic signs of infection. She is scheduled for her MRI On 2/27. She has home health and is changing the dressing with Hydrofera Blue. She is unable to do this daily. She has a family member present to help watch how to do the dressing. 3/1; patient presents for follow-up. She was able to obtain the solution for Augmentin from her pharmacy however there was a short supply and this is on backorder currently. She continues to take doxycycline as prescribed. She reports improvement in her foot pain. She had her MRI completed. She states she has not scheduled an appointment with vein and vascular. She is doing  Hydrofera Blue with dressing changes. She denies systemic signs of infection. 3/8; patient presents for follow-up. She reports improvement in wound healing. She has been using Hydrofera Blue. She has not been able to get in contact with vein and vascular to schedule an appointment to see her vascular surgeon. She states she has an appointment with her orthopedic surgeon at the end of the month. [name], [name] ([id number]) Electronic Signature(s) Signed: 05/28/2021 11:05:51 AM By: Kalman Shan DO Entered By: Kalman Shan on 05/28/2021 10:58:38 Belk, [name] ([id number]) -------------------------------------------------------------------------------- Physical Exam Details Patient Name: [name] Date of Service: 05/28/2021 9:45 AM Medical Record Number: [id number] Patient Account Number: [id number] Date of Birth/Sex: Aug 23, 1965 (55 y.o. F) Treating RN: Levora Dredge Primary Care Provider: Laurance Flatten Other Clinician: Referring Provider: Laurance Flatten Treating  Provider/Extender: Yaakov Guthrie in Treatment: 3 Constitutional . Psychiatric . Notes Right foot: To the great toe amputation site there is dehiscence. There is nonviable tissue at the opening. To the medial aspect there are 2 small openings with Scant serous drainage. No erythema or tenderness to palpation. Difficult to palpate pedal pulses. Electronic Signature(s) Signed: 05/28/2021 11:05:51 AM By: Kalman Shan DO Entered By: Kalman Shan on 05/28/2021 10:59:20 Dolinsky, [name] ([id number]) -------------------------------------------------------------------------------- Physician Orders Details Patient Name: [name] Date of Service: 05/28/2021 9:45 AM Medical Record Number: [id number] Patient Account Number: [id number] Date of Birth/Sex: [birth year] (55 y.o. F) Treating RN: Levora Dredge Primary Care Provider: Laurance Flatten Other Clinician: Referring Provider: Laurance Flatten Treating Provider/Extender: Yaakov Guthrie in Treatment: 3 Verbal / Phone Orders: No Diagnosis Coding ICD-10 Coding Code Description E11.621 Type 2 diabetes mellitus with foot ulcer T81.30XA Disruption of wound, unspecified, initial encounter L97.512 Non-pressure chronic ulcer of other part of right foot with fat layer exposed M86.9 Osteomyelitis, unspecified Follow-up Appointments o Return Appointment in 1 week. o Nurse Visit as needed o Other: - [facility] has patient on list to call back and set up appointment to be seen [facility] for wound care. May utilize formulary equivalent dressing for wound treatment orders unless otherwise specified. Home Health Nurse may visit PRN to address patientos wound care needs. o Scheduled days for dressing changes to be completed; exception, patient has scheduled wound care visit that day. o **Please direct any NON-WOUND related issues/requests for orders to  patient's Primary Care Physician. **If current dressing causes regression in wound condition, may D/C ordered dressing product/s and apply Normal Saline Moist Dressing daily until next [place] or Other MD appointment. **Notify [facility] of regression in wound condition at [phone]. Bathing/ Shower/ Hygiene o Clean wound with Normal Saline or wound cleanser. o Wash wounds with antibacterial soap and water. o May shower with wound dressing protected with water repellent cover or cast protector. o No tub bath. Edema Control - Lymphedema / Segmental Compressive Device / Other o Elevate, Exercise Daily and Avoid Standing for Long Periods of Time. o Elevate leg(s) parallel to the floor when sitting. o DO YOUR BEST to sleep in the bed at night. DO NOT sleep in your recliner. Long hours of sitting in a recliner leads to swelling of the legs and/or potential wounds on your backside. Wound Treatment Wound #10 - Foot Wound Laterality: Plantar, Right Cleanser: Normal Saline 1 x Per Day/30 Days Discharge Instructions: Wash your hands with soap and water. Remove old dressing, discard into plastic bag and place into trash. Cleanse the wound with Normal Saline prior  to applying a clean dressing using gauze sponges, not tissues or cotton balls. Do not scrub or use excessive force. Pat dry using gauze sponges, not tissue or cotton balls. Primary Dressing: Drawtex Hydroconductive Wound Dressing, 3.5x12 (in/in) 1 x Per Day/30 Days Secondary Dressing: ABD Pad 5x9 (in/in) 1 x Per Day/30 Days Discharge Instructions: Cover with ABD pad Secured With: Medipore Tape - 91M Medipore H Soft Cloth Surgical Tape, 2x2 (in/yd) 1 x Per Day/30 Days Secured With: Conform 4'' - Conforming Stretch Gauze Bandage 4x75 (in/in) 1 x Per Day/30 Days Discharge Instructions: Apply as directed Wound #9 - Foot Wound Laterality: Right, Midline Cleanser: Normal Saline 1 x Per Day/30 Days [name],  [name] ([id number]) Discharge Instructions: Wash your hands with soap and water. Remove old dressing, discard into plastic bag and place into trash. Cleanse the wound with Normal Saline prior to applying a clean dressing using gauze sponges, not tissues or cotton balls. Do not scrub or use excessive force. Pat dry using gauze sponges, not tissue or cotton balls. Primary Dressing: Hydrofera Blue Ready Transfer Foam, 2.5x2.5 (in/in) 1 x Per Day/30 Days Discharge Instructions: Apply Hydrofera Blue Ready to wound bed as directed Secondary Dressing: ABD Pad 5x9 (in/in) 1 x Per Day/30 Days Discharge Instructions: Cover with ABD pad Secured With: Medipore Tape - 91M Medipore H Soft Cloth Surgical Tape, 2x2 (in/yd) 1 x Per Day/30 Days Secured With: Conform 4'' - Conforming Stretch Gauze Bandage 4x75 (in/in) 1 x Per Day/30 Days Discharge Instructions: Apply as directed Patient Medications Allergies: Tylenol PM, diazepam, latex, simvastatin, ciprofloxacin, levofloxacin, Omnicef, Insulins Notifications Medication Indication Start End Bactrim DS 05/28/2021 DOSE 1 - oral 800 mg-160 mg tablet - 1 tablet oral BID x 7 days Electronic Signature(s) Signed: 05/28/2021 11:35:02 AM By: Levora Dredge Signed: 05/28/2021 1:12:13 PM By: Kalman Shan DO Previous Signature: 05/28/2021 11:04:48 AM Version By: Kalman Shan DO Entered By: Levora Dredge on 05/28/2021 11:35:01 [name], [name] ([id number]) -------------------------------------------------------------------------------- Problem List Details Patient Name: [name] Date of Service: 05/28/2021 9:45 AM Medical Record Number: [id number] Patient Account Number: [id number] Date of Birth/Sex: May 12, 1965 (56 y.o. F) Treating RN: Levora Dredge Primary Care Provider: Laurance Flatten Other Clinician: Referring Provider: Laurance Flatten Treating Provider/Extender: Yaakov Guthrie in Treatment: 3 Active Problems ICD-10 Encounter Code Description Active Date  MDM Diagnosis E11.621 Type 2 diabetes mellitus with foot ulcer 05/07/2021 No Yes T81.30XA Disruption of wound, unspecified, initial encounter 05/07/2021 No Yes L97.512 Non-pressure chronic ulcer of other part of right foot with fat layer 05/07/2021 No Yes exposed M86.9 Osteomyelitis, unspecified 05/07/2021 No Yes Inactive Problems Resolved Problems Electronic Signature(s) Signed: 05/28/2021 11:05:51 AM By: Kalman Shan DO Entered By: Kalman Shan on 05/28/2021 10:57:16 [name], [name] ([id number]) -------------------------------------------------------------------------------- Progress Note Details Patient Name: [name] Date of Service: 05/28/2021 9:45 AM Medical Record Number: [id number] Patient Account Number: [id number] Date of Birth/Sex: [birth year] (55 y.o. F) Treating RN: Levora Dredge Primary Care Provider: Laurance Flatten Other Clinician: Referring Provider: Laurance Flatten Treating Provider/Extender: Yaakov Guthrie in Treatment: 3 Subjective Chief Complaint Information obtained from Patient 05/07/2021; surgical wound dehiscence to the right great toe amputation site. History of Present Illness (HPI) 10/24/2018 on evaluation today patient presents for initial evaluation or clinic concerning issues that she has been having with lymphedema for quite some time. Unfortunately she has several wound openings at this point that secondary to her lymphedema/venous stasis are giving her trouble and leaking quite severely. She also has diabetes along with hypertension and stage III chronic kidney disease. Fortunately there  is no signs of active infection at this time. She is going to likely require debridement of the left leg ulcer upon evaluation today just based on what I am seeing. Fortunately there is no wound opening on the right. Overall the patient seems to be doing quite well and again there is no evidence of systemic infection which is good news. No fevers, chills, nausea,  vomiting, or diarrhea. 10/31/2018 on evaluation today patient actually appears to be doing excellent in regard to her lower extremity ulcers. She has been tolerating the dressing changes without complication. Fortunately there is no signs of active infection at this time. She has tolerated 3 layer compression wrap without complication. 11/07/2018 upon evaluation today patient actually appears to be doing very well with regard to her left lower extremity ulcers. She has been tolerating the dressing changes without complication. Fortunately there is no signs of active infection. No fevers, chills, nausea, vomiting, or diarrhea. 11/21/2018 upon evaluation today patient appears to be doing quite well with regard to her lower extremity ulcers. In fact both areas seem to be showing signs of good improvement which is excellent. She is not having as much pain as she has in the past and again has a lot of healing compared to previous visits as well. 12/05/2018 on evaluation today patient presents for follow-up concerning her ongoing issues with her bilateral lower extremity ulcers. The good news is her right lower extremity is showing signs of completely healing at this time which is great news. Fortunately there is no evidence of active infection. On the left she has just a very small area still remaining that is open at this time all in all she is very close to complete closure in my opinion. She does have compression stockings to wear at home. 12/12/2018 on evaluation today patient appears to be doing excellent in regard to her left lateral lower extremity ulcer. She has been tolerating the dressing changes without complication. Fortunately there is no signs of active infection at this time. In fact this appears to be pretty much healed at this point although again she is not 100% today. No fevers, chills, nausea, vomiting, or diarrhea. 12/19/2018 on evaluation today patient actually appears to be doing  excellent with regard to her lower extremity ulcer in fact this appears to be completely healed today which is all some. She has done extremely well with wound care measures. No fevers, chills, nausea, vomiting, or diarrhea. ------------------------------------ 11/01/19-Readmission to the clinic Patient presents with left leg pain, wounds posterior calf, onset about 4 weeks, denies any fevers chills or shakes, has not been using anything to these wounds, was given compression stockings at last discharge from the clinic but has not been able to use them as they rolled down and cause creases Patient's history significant for type 2 diabetes insulin requiring, A1c of 6.9 lately, hypertension, chronic pain 8/18; patient readmitted that the clinic last week. She has wounds on her left lateral posterior lower leg all of this in close juxtaposition i.e. a localized site. We've been using silver alginate under 3 layer compression apparently the wound surface area is better. She was wearing compression stockings from [facility] but says they were falling down. As she progresses more towards healing will need to address what we use in terms of compression stockings perhaps external compression garments 11/17/2019 on evaluation today patient appears to be doing well with regard to her legs currently. She does have 2 wounds which are actually measuring much better  the more posterior is actually doing very well which I am pleased with the other though smaller is not quite a small but nonetheless on the lateral aspect does seem to be improving. 11/23/2019 on evaluation today patient appears to be doing well in regard to her wounds. In fact the wound on the posterior aspect of her leg appears healed the lateral aspect is still open but extremely small. 9/15; this is a patient with chronic venous insufficiency and secondary lymphedema. She did not keep her clinic appointment last week and hence the left leg is a  lot more swollen since she had to take the wrap off at some point. She has a small weeping area on the left lateral lower leg. She has probably a juxta lite stocking for the left leg I have asked her to bring that in when she comes to her clinic appointment next week at which time she should be healed 10/6; this is a patient with chronic venous insufficiency and secondary lymphedema left greater than right. Skin changes of chronic lymphedema especially in the left leg. She has not been here in 3 weeks. She took the wrap off 2 weeks ago. She has some very old 20/30 stockings from elastic therapy in [place] she has been using. Fortunately her leg is closed. She also has a single [place] wrap for the left leg Readmission: 06/18/2020 upon evaluation today patient presents for reevaluation here in the clinic. She is a long-term patient that we have seen intermittently [name], [name] ([id number]) when she has had flareups of issues with her lower extremity secondary to chronic venous stasis. With that being said unfortunately today she is having a significant issue with a wound which is actually quite significant over the right lower extremity. She did see dermatology they put Vaseline followed by [place] dressings on her unfortunately she is continuing to have significant issues however here with pain and in fact this wrap was extremely stuck to her upon evaluation today. Nonetheless I believe that she is going to require a little bit different approach to try to keep things from sticking and hopefully aid in getting this to heal more effectively and quickly. With that being said she does have a history of chronic venous insufficiency, lymphedema, diabetes mellitus type 2, chronic kidney disease stage III, hypertension, and congestive heart failure. This started as an injury in mid February and has worsened over the past 2 weeks. 07/04/2020 upon evaluation today patient appears to be doing well with  regard to her wound on the leg. Overall I am extremely pleased with where things stand and I do think that she is making excellent progress. There is no sign of infection right now which is also great news. I think that she is close to complete resolution. 07/09/20 upon evaluation today patient appears to be doing excellent in regard to her leg ulcer. She has been tolerating the dressing changes without complication and in general I am extremely pleased with where things stand today. There does not appear to be any signs of active infection at this time which is great news. 07/25/2020 upon evaluation today patient appears to be doing well with regard to her wound. She has been tolerating the dressing changes without complication. Fortunately there is no signs of active infection at this time and her wound appears to be completely healed. Readmission: 09/30/2020 upon evaluation today patient appears for reevaluation here in our clinic. She was discharged May 5 that unfortunately has started to have weeping  and wounds on the left leg at this point. She tells me the right leg did weep some but nothing as significant as the left. With all that being said at this point the patient states that she figured it was best to come in here and she was having a lot of drainage and it was get all of her sheets and everything. She has been wearing her Velcro wraps sometimes but it does not sound like she is been wearing them all the time which I think is part of the reason why this reopened. She also unfortunately had a fall which was quite significant. This occurred I believe about a week or 2 ago. Otherwise patient's past medical history really has not changed significantly. 10/07/2020 upon evaluation today patient appears to be doing better in regard to her wounds of the lower extremities. Fortunately there does not appear to be any signs of active infection at this time which is great news. I do not see any evidence  of anything worsening but again she continues to have significant bilateral stage III lymphedema. This has been an ongoing issue. We actually have seen her this year pretty much from March through May of 2022. Following this she continued with compression at home following but then unfortunately reopened again. That was on July 11. With that being said this is an ongoing and recurrent issue for which honestly have seen her over the past 2 years pretty much consistently. Nonetheless we have had Velcro compression wraps that she uses at home and despite this she continues to have frequent and recurrent ulcerations. 7/27; patient presents for 1 week follow-up. She has no issues or complaints today. She denies signs of infection. 10/21/2020 upon evaluation today patient's leg actually appears to be doing decently well on the left there is nothing open on the right at all and I am pleased in that regard. On the left side I do think that she still has some areas of weeping I believe the compression wrap is still the best way to go although I am hopeful that we are headed in the right direction as far as getting this area to heal and silver. Upon inspection patient's wound bed actually showed signs of good granulation epithelization at this point. There does not appear to be any evidence of active infection which is great news and overall I am extremely pleased with where things stand. 11/04/2020 upon evaluation today patient appears to be doing well with regard to her left leg. In fact this is getting very close to complete granulation and epithelization at this point I am very pleased with where we stand. I do not see any signs of infection currently. 11/18/2020 upon evaluation today patient appears to be doing well in regard to her wound. She is in fact doing extremely well considering the fact we did need to see her last week she was apparently sick and missed her appointment. She is therefore had the wrap  on for 2 weeks. Fortunately there is no signs of active infection at this time. No fevers, chills, nausea, vomiting, or diarrhea. 11/28/2020 upon evaluation today patient appears to be doing well with regard to her legs. Things are healing nicely and overall I do not see any signs of infection at this time which is great news. No fevers, chills, nausea, vomiting, or diarrhea. With that being said she still has a couple areas that are open and weeping currently. 12/10/2020 upon evaluation today patient appears to be doing  well with regard to her wound. She has been tolerating the dressing changes without complication. Fortunately there does not appear to be any signs of active infection at this time. No fevers, chills, nausea, vomiting, or diarrhea. 10/11; patient comes in today with really excellent edema control. Everything appears to be closed in clinic occluding the left lateral. The area on the right anterior has some eschar on the top I did not remove this. She has dry scaly skin which might benefit from ammonium lactate but for now I just discharged her with a moisturizer. The patient is also apparently fractured her right great toe. We gave her some stockinette to wear under her juxta lite on that side Readmission 05/07/2021 Ms. 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 There was a revision of the site on 03/13/2021. She has been using gauze to the wound site.She reports pain to the medial aspect with drainage. Home health was changing the dressing however wound care visits have expired. She is trying to do the dressing change on her own but reports having trouble with this. She currently denies fever/chills. 2/22;  patient presents for follow-up. She reports taking Augmentin for the past week. She reports heartburn from this and would like the solution version. She is on doxycycline prescribed by another provider for the next few months. She currently denies systemic signs of infection. She is scheduled for her MRI On 2/27. She has home health and is changing the dressing with Hydrofera Blue. She is unable to do this daily. She has a family member present to help watch how to do the dressing. 3/1; patient presents for follow-up. She was able to obtain the solution for Augmentin from her pharmacy however there was a short supply and this is on backorder currently. She continues to take doxycycline as prescribed. She reports improvement in her foot pain. She had her MRI completed. She states she has not scheduled an appointment with vein and vascular. She is doing [employer] with dressing changes. She denies systemic signs of infection. [name], [name] ([id number]) 3/8; patient presents for follow-up. She reports improvement in wound healing. She has been using Hydrofera Blue. She has not been able to get in contact with vein and vascular to schedule an appointment to see her vascular surgeon. She states she has an appointment with her orthopedic surgeon at the end of the month. Objective Constitutional Vitals Time Taken: 9:59 AM, Temperature: 98.3 F, Pulse: 80 bpm, Respiratory Rate: 18 breaths/min, Blood Pressure: 138/75 mmHg. General Notes: Right foot: To the great toe amputation site there is dehiscence. There is nonviable tissue at the opening. To the medial aspect there are 2 small openings with Scant serous drainage. No erythema or tenderness to palpation. Difficult to palpate pedal pulses. Integumentary (Hair, Skin) Wound #10 status is Open. Original cause of wound was Surgical Injury. The date acquired was: 03/13/2021. The wound has been in treatment 3 weeks. The wound is located on the  [place]. The wound measures 0.1cm length x 0.1cm width x 0.1cm depth; 0.008cm^2 area and 0.001cm^3 volume. There is Fat Layer (Subcutaneous Tissue) exposed. There is no tunneling or undermining noted. There is a medium amount of serosanguineous drainage noted. There is small (1-33%) granulation  within the wound bed. There is a large (67-100%) amount of necrotic tissue within the wound bed including Adherent Slough. Wound #9 status is Open. Original cause of wound was Surgical Injury. The date acquired was: 03/13/2021. The wound has been in treatment 3 weeks. The wound is located on the [place]. The wound measures 1cm length x 4cm width x 0.3cm depth; 3.142cm^2 area and 0.942cm^3 volume. There is Fat Layer (Subcutaneous Tissue) exposed. There is no tunneling or undermining noted. There is a medium amount of serosanguineous drainage noted. There is small (1-33%) granulation within the wound bed. There is a large (67-100%) amount of necrotic tissue within the wound bed including Adherent Slough. Assessment Active Problems ICD-10 Type 2 diabetes mellitus with foot ulcer Disruption of wound, unspecified, initial encounter Non-pressure chronic ulcer of other part of right foot with fat layer exposed Osteomyelitis, unspecified Overall foot appears improved with no increased erythema or drainage noted. I recommended continuing Hydrofera Blue. I did obtain a culture at last clinic visit that showed Citrobacter koseri. This is sensitive to cefazolin, ciprofloxacin and Bactrim. She reports an allergy of anxiety to cefazolin and Cipro. I will prescribe Bactrim. I asked her to stop Augmentin. We will go ahead and do a formal referral to her vascular surgeon since there has been difficulty in setting up an appointment. Plan 1. Bactrim, stop Augmentin 2. Hydrofera Blue 3. Follow-up in 1 week Electronic Signature(s) Signed: 05/28/2021 11:05:51 AM By: Kalman Shan DO Entered By:  Kalman Shan on 05/28/2021 11:02:58 [name], [name] ([id number]) -------------------------------------------------------------------------------- SuperBill Details Patient Name: [name] Date of Service: 05/28/2021 Medical Record Number: [id number] Patient Account Number: [id number] Date of Birth/Sex: April 11, 1965 (55 y.o. F) Treating RN: Levora Dredge Primary Care Provider: Laurance Flatten Other Clinician: Referring Provider: Laurance Flatten Treating Provider/Extender: Yaakov Guthrie in Treatment: 3 Diagnosis Coding ICD-10 Codes Code Description E11.621 Type 2 diabetes mellitus with foot ulcer T81.30XA Disruption of wound, unspecified, initial encounter L97.512 Non-pressure chronic ulcer of other part of right foot with fat layer exposed M86.9 Osteomyelitis, unspecified Physician Procedures CPT4 Code: [id number] Description: 31 - WC PHYS LEVEL 4 - EST PT Modifier: Quantity: 1 CPT4 Code: Description: ICD-10 Diagnosis Description L97.512 Non-pressure chronic ulcer of other part of right foot with fat layer e E11.621 Type 2 diabetes mellitus with foot ulcer T81.30XA Disruption of wound, unspecified, initial encounter M86.9 Osteomyelitis,  unspecified Modifier: xposed Quantity: Electronic Signature(s) Signed: 05/28/2021 11:38:09 AM By: Levora Dredge Signed: 05/28/2021 1:12:13 PM By: Kalman Shan DO Previous Signature: 05/28/2021 11:05:51 AM Version By: Kalman Shan DO Entered By: Levora Dredge on 05/28/2021 11:38:08

## 2021-05-28 NOTE — Progress Notes (Signed)
[name], [name] ([id number]) Visit Report for 05/28/2021 Arrival Information Details Patient Name: [name] Date of Service: 05/28/2021 9:45 AM Medical Record Number: [id number] Patient Account Number: [id number] Date of Birth/Sex: [birth year] (56 y.o. F) Treating RN: Levora Dredge Primary Care [name]: Laurance Flatten Other Clinician: Referring [name]: Laurance Flatten Treating Keylor Rands/Extender: Yaakov Guthrie in Treatment: 3 Visit Information History Since Last Visit Added or deleted any medications: No Patient Arrived: [name] Any new allergies or adverse reactions: No Arrival Time: 09:54 Had a fall or experienced change in No Accompanied By: aunt activities of daily living that may affect Transfer Assistance: None risk of falls: Patient Identification Verified: Yes Hospitalized since last visit: No Secondary Verification Process Completed: Yes Has Dressing in Place as Prescribed: Yes Patient Requires Transmission-Based Precautions: No Pain Present Now: Yes Patient Has Alerts: Yes Patient Alerts: diabetic Electronic Signature(s) Signed: 05/28/2021 3:52:56 PM By: Levora Dredge Entered By: Levora Dredge on 05/28/2021 09:59:25 [name] ([id number]) -------------------------------------------------------------------------------- Clinic Level of Care Assessment Details Patient Name: [name] Date of Service: 05/28/2021 9:45 AM Medical Record Number: [id number] Patient Account Number: [id number] Date of Birth/Sex: 11-Jan-1966 (56 y.o. F) Treating RN: Levora Dredge Primary Care Magdaleno Lortie: Laurance Flatten Other Clinician: Referring Lyllian Gause: Laurance Flatten Treating Kina Shiffman/Extender: Yaakov Guthrie in Treatment: 3 Clinic Level of Care Assessment Items TOOL 4 Quantity Score '[]'$  - Use when only an EandM is performed on FOLLOW-UP visit 0 ASSESSMENTS - Nursing Assessment / Reassessment '[]'$  - Reassessment of Co-morbidities (includes updates in patient status) 0 X- 1  5 Reassessment of Adherence to Treatment Plan ASSESSMENTS - Wound and Skin Assessment / Reassessment '[]'$  - Simple Wound Assessment / Reassessment - one wound 0 X- 2 5 Complex Wound Assessment / Reassessment - multiple wounds '[]'$  - 0 Dermatologic / Skin Assessment (not related to wound area) ASSESSMENTS - Focused Assessment '[]'$  - Circumferential Edema Measurements - multi extremities 0 '[]'$  - 0 Nutritional Assessment / Counseling / Intervention '[]'$  - 0 Lower Extremity Assessment (monofilament, tuning fork, pulses) '[]'$  - 0 Peripheral Arterial Disease Assessment (using hand held doppler) ASSESSMENTS - Ostomy and/or Continence Assessment and Care '[]'$  - Incontinence Assessment and Management 0 '[]'$  - 0 Ostomy Care Assessment and Management (repouching, etc.) PROCESS - Coordination of Care X - Simple Patient / Family Education for ongoing care 1 15 '[]'$  - 0 Complex (extensive) Patient / Family Education for ongoing care '[]'$  - 0 Staff obtains Programmer, systems, Records, Test Results / Process Orders '[]'$  - 0 Staff telephones HHA, Nursing Homes / Clarify orders / etc '[]'$  - 0 Routine Transfer to another Facility (non-emergent condition) '[]'$  - 0 Routine Hospital Admission (non-emergent condition) '[]'$  - 0 New Admissions / Biomedical engineer / Ordering NPWT, Apligraf, etc. '[]'$  - 0 Emergency Hospital Admission (emergent condition) X- 1 10 Simple Discharge Coordination '[]'$  - 0 Complex (extensive) Discharge Coordination PROCESS - Special Needs '[]'$  - Pediatric / Minor Patient Management 0 '[]'$  - 0 Isolation Patient Management '[]'$  - 0 Hearing / Language / Visual special needs '[]'$  - 0 Assessment of Community assistance (transportation, D/C planning, etc.) '[]'$  - 0 Additional assistance / Altered mentation '[]'$  - 0 Support Surface(s) Assessment (bed, cushion, seat, etc.) INTERVENTIONS - Wound Cleansing / Measurement [name] ([id number]) '[]'$  - 0 Simple Wound Cleansing - one wound X- 2 5 Complex Wound  Cleansing - multiple wounds X- 1 5 Wound Imaging (photographs - any number of wounds) '[]'$  - 0 Wound Tracing (instead of photographs) '[]'$  - 0 Simple Wound Measurement - one wound X- 2 5  Complex Wound Measurement - multiple wounds INTERVENTIONS - Wound Dressings X - Small Wound Dressing one or multiple wounds 2 10 '[]'$  - 0 Medium Wound Dressing one or multiple wounds '[]'$  - 0 Large Wound Dressing one or multiple wounds X- 1 5 Application of Medications - topical '[]'$  - 0 Application of Medications - injection INTERVENTIONS - Miscellaneous '[]'$  - External ear exam 0 '[]'$  - 0 Specimen Collection (cultures, biopsies, blood, body fluids, etc.) '[]'$  - 0 Specimen(s) / Culture(s) sent or taken to Lab for analysis '[]'$  - 0 Patient Transfer (multiple staff / Civil Service fast streamer / Similar devices) '[]'$  - 0 Simple Staple / Suture removal (25 or less) '[]'$  - 0 Complex Staple / Suture removal (26 or more) '[]'$  - 0 Hypo / Hyperglycemic Management (close monitor of Blood Glucose) '[]'$  - 0 Ankle / Brachial Index (ABI) - do not check if billed separately X- 1 5 Vital Signs Has the patient been seen at the hospital within the last three years: Yes Total Score: 95 Level Of Care: New/Established - Level 3 Electronic Signature(s) Signed: 05/28/2021 3:52:56 PM By: Levora Dredge Entered By: Levora Dredge on 05/28/2021 11:37:34 [name] ([id number]) -------------------------------------------------------------------------------- Encounter Discharge Information Details Patient Name: [name] Date of Service: 05/28/2021 9:45 AM Medical Record Number: [id number] Patient Account Number: [id number] Date of Birth/Sex: 12/30/1965 (56 y.o. F) Treating RN: Levora Dredge Primary Care Shakeisha Horine: Laurance Flatten Other Clinician: Referring Rayce Brahmbhatt: Laurance Flatten Treating Lovett Coffin/Extender: Yaakov Guthrie in Treatment: 3 Encounter Discharge Information Items Discharge Condition: Stable Ambulatory Status:  Ambulatory Discharge Destination: Home Transportation: Private Auto Accompanied By: self / aunt Schedule Follow-up Appointment: Yes Clinical Summary of Care: Electronic Signature(s) Signed: 05/28/2021 11:39:29 AM By: Levora Dredge Entered By: Levora Dredge on 05/28/2021 11:39:29 Astacio, [name] ([id number]) -------------------------------------------------------------------------------- Lower Extremity Assessment Details Patient Name: [name] Date of Service: 05/28/2021 9:45 AM Medical Record Number: [id number] Patient Account Number: [id number] Date of Birth/Sex: [birth year] (55 y.o. F) Treating RN: Levora Dredge Primary Care Ansen Sayegh: Laurance Flatten Other Clinician: Referring Jelisha Weed: Laurance Flatten Treating Sydnie Sigmund/Extender: Yaakov Guthrie in Treatment: 3 Edema Assessment Assessed: [Left: No] [Right: No] Edema: [Left: Ye] [Right: s] Calf Left: Right: Point of Measurement: 35 cm From Medial Instep 44 cm Ankle Left: Right: Point of Measurement: 10 cm From Medial Instep 25 cm Vascular Assessment Pulses: Dorsalis Pedis Palpable: [Left:Yes] Electronic Signature(s) Signed: 05/28/2021 3:52:56 PM By: Levora Dredge Entered By: Levora Dredge on 05/28/2021 10:15:18 Marzan, Shandrea ([id number]) -------------------------------------------------------------------------------- Multi Wound Chart Details Patient Name: [name] Date of Service: 05/28/2021 9:45 AM Medical Record Number: [id number] Patient Account Number: [id number] Date of Birth/Sex: 03/08/1966 (55 y.o. F) Treating RN: Levora Dredge Primary Care Desire Fulp: Laurance Flatten Other Clinician: Referring Pax Reasoner: Laurance Flatten Treating Nakeia Calvi/Extender: Yaakov Guthrie in Treatment: 3 Vital Signs Height(in): Pulse(bpm): 80 Weight(lbs): Blood Pressure(mmHg): 138/75 Body Mass Index(BMI): Temperature(F): 98.3 Respiratory Rate(breaths/min): 18 Photos: [N/A:N/A] Wound Location: Right, Plantar Foot  Right, Midline Foot N/A Wounding Event: Surgical Injury Surgical Injury N/A Primary Etiology: Diabetic Wound/Ulcer of the Lower Open Surgical Wound N/A Extremity Comorbid History: Sleep Apnea, Congestive Heart Sleep Apnea, Congestive Heart N/A Failure, Coronary Artery Disease, Failure, Coronary Artery Disease, Hypertension, Myocardial Infarction, Hypertension, Myocardial Infarction, Peripheral Venous Disease, Type II Peripheral Venous Disease, Type II Diabetes, End Stage Renal Disease, Diabetes, End Stage Renal Disease, Gout, Neuropathy Gout, Neuropathy Date Acquired: 03/13/2021 03/13/2021 N/A Weeks of Treatment: 3 3 N/A Wound Status: Open Open N/A Wound Recurrence: No No N/A Clustered Wound: Yes No N/A Clustered Quantity: 2 N/A N/A  Pending Amputation on Yes Yes N/A Presentation: Measurements L x W x D (cm) 0.1x0.1x0.1 1x4x0.3 N/A Area (cm) : 0.008 3.142 N/A Volume (cm) : 0.001 0.942 N/A % Reduction in Area: 99.80% 40.70% N/A % Reduction in Volume: 99.70% 64.50% N/A Classification: Grade 2 Full Thickness Without Exposed N/A Support Structures Exudate Amount: Medium Medium N/A Exudate Type: Serosanguineous Serosanguineous N/A Exudate Color: red, brown red, brown N/A Granulation Amount: Small (1-33%) Small (1-33%) N/A Necrotic Amount: Large (67-100%) Large (67-100%) N/A Exposed Structures: Fat Layer (Subcutaneous Tissue): Fat Layer (Subcutaneous Tissue): N/A Yes Yes Epithelialization: None None N/A Treatment Notes Electronic Signature(s) Signed: 05/28/2021 3:52:56 PM By: Levora Dredge Entered By: Levora Dredge on 05/28/2021 10:44:58 Meir, Adrielle ([id number]) SHERE, EISENHART ([id number]) -------------------------------------------------------------------------------- Multi-Disciplinary Care Plan Details Patient Name: [name] Date of Service: 05/28/2021 9:45 AM Medical Record Number: [id number] Patient Account Number: [id number] Date of Birth/Sex: 24-Mar-1965 (55 y.o.  F) Treating RN: Levora Dredge Primary Care Jacobus Colvin: Laurance Flatten Other Clinician: Referring Dorsie Sethi: Laurance Flatten Treating Teruko Joswick/Extender: Yaakov Guthrie in Treatment: 3 Active Inactive Orientation to the Wound Care Program Nursing Diagnoses: Knowledge deficit related to the wound healing center program Goals: Patient/caregiver will verbalize understanding of the [place] Program Date Initiated: 05/07/2021 Target Resolution Date: 05/07/2021 Goal Status: Active Interventions: Provide education on orientation to the wound center Notes: Wound/Skin Impairment Nursing Diagnoses: Impaired tissue integrity Knowledge deficit related to ulceration/compromised skin integrity Goals: Ulcer/skin breakdown will have a volume reduction of 30% by week 4 Date Initiated: 05/07/2021 Target Resolution Date: 06/04/2021 Goal Status: Active Ulcer/skin breakdown will have a volume reduction of 50% by week 8 Date Initiated: 05/07/2021 Target Resolution Date: 07/02/2021 Goal Status: Active Ulcer/skin breakdown will have a volume reduction of 80% by week 12 Date Initiated: 05/07/2021 Target Resolution Date: 07/30/2021 Goal Status: Active Ulcer/skin breakdown will heal within 14 weeks Date Initiated: 05/07/2021 Target Resolution Date: 08/13/2021 Goal Status: Active Interventions: Assess patient/caregiver ability to obtain necessary supplies Assess patient/caregiver ability to perform ulcer/skin care regimen upon admission and as needed Assess ulceration(s) every visit Provide education on ulcer and skin care Notes: Electronic Signature(s) Signed: 05/28/2021 3:52:56 PM By: Levora Dredge Entered By: Levora Dredge on 05/28/2021 10:44:44 Santillana, Cythina ([id number]) -------------------------------------------------------------------------------- Pain Assessment Details Patient Name: [name] Date of Service: 05/28/2021 9:45 AM Medical Record Number: [id number] Patient  Account Number: [id number] Date of Birth/Sex: 12/15/1965 (56 y.o. F) Treating RN: Levora Dredge Primary Care Cheo Selvey: Laurance Flatten Other Clinician: Referring Aarav Burgett: Laurance Flatten Treating Kiya Eno/Extender: Yaakov Guthrie in Treatment: 3 Active Problems Location of Pain Severity and Description of Pain Patient Has Paino Yes Site Locations Rate the pain. Current Pain Level: 5 Pain Management and Medication Current Pain Management: Electronic Signature(s) Signed: 05/28/2021 3:52:56 PM By: Levora Dredge Entered By: Levora Dredge on 05/28/2021 10:02:34 Lac, [name] ([id number]) -------------------------------------------------------------------------------- Patient/Caregiver Education Details Patient Name: [name] Date of Service: 05/28/2021 9:45 AM Medical Record Number: [id number] Patient Account Number: [id number] Date of Birth/Gender: [birth year] (55 y.o. F) Treating RN: Levora Dredge Primary Care Physician: Laurance Flatten Other Clinician: Referring Physician: Laurance Flatten Treating Physician/Extender: Yaakov Guthrie in Treatment: 3 Education Assessment Education Provided To: Patient Education Topics Provided Wound/Skin Impairment: Handouts: Caring for Your Ulcer Methods: Explain/Verbal Responses: State content correctly Electronic Signature(s) Signed: 05/28/2021 3:52:56 PM By: Levora Dredge Entered By: Levora Dredge on 05/28/2021 11:38:32 Ruth, Sabine ([id number]) -------------------------------------------------------------------------------- Wound Assessment Details Patient Name: [name] Date of Service: 05/28/2021 9:45 AM Medical Record Number: [id number] Patient Account Number: [id number] Date of Birth/Sex: 04-Aug-1965 (  56 y.o. F) Treating RN: Levora Dredge Primary Care Moris Ratchford: Laurance Flatten Other Clinician: Referring Cleone Hulick: Laurance Flatten Treating Gelena Klosinski/Extender: Yaakov Guthrie in Treatment: 3 Wound Status Wound Number:  10 Primary Diabetic Wound/Ulcer of the Lower Extremity Etiology: Wound Location: Right, Plantar Foot Wound Open Wounding Event: Surgical Injury Status: Date Acquired: 03/13/2021 Comorbid Sleep Apnea, Congestive Heart Failure, Coronary Artery Weeks Of Treatment: 3 History: Disease, Hypertension, Myocardial Infarction, Peripheral Clustered Wound: Yes Venous Disease, Type II Diabetes, End Stage Renal Pending Amputation On Presentation Disease, Gout, Neuropathy Photos Wound Measurements Length: (cm) 0.1 Width: (cm) 0.1 Depth: (cm) 0.1 Clustered Quantity: 2 Area: (cm) 0.008 Volume: (cm) 0.001 % Reduction in Area: 99.8% % Reduction in Volume: 99.7% Epithelialization: None Tunneling: No Undermining: No Wound Description Classification: Grade 2 Exudate Amount: Medium Exudate Type: Serosanguineous Exudate Color: red, brown Foul Odor After Cleansing: No Slough/Fibrino Yes Wound Bed Granulation Amount: Small (1-33%) Exposed Structure Necrotic Amount: Large (67-100%) Fat Layer (Subcutaneous Tissue) Exposed: Yes Necrotic Quality: Adherent Slough Treatment Notes Wound #10 (Foot) Wound Laterality: Plantar, Right Cleanser Normal Saline Discharge Instruction: Wash your hands with soap and water. Remove old dressing, discard into plastic bag and place into trash. Cleanse the wound with Normal Saline prior to applying a clean dressing using gauze sponges, not tissues or cotton balls. Do not scrub or use excessive force. Pat dry using gauze sponges, not tissue or cotton balls. Peri-Wound Care Wismer, Nashayla ([id number]) Topical Primary Dressing Drawtex Hydroconductive Wound Dressing, 3.5x12 (in/in) Secondary Dressing ABD Pad 5x9 (in/in) Discharge Instruction: Cover with ABD pad Secured With [place] H Soft Cloth Surgical Tape, 2x2 (in/yd) Conform 4'' - Conforming Stretch Gauze Bandage 4x75 (in/in) Discharge Instruction: Apply as directed Compression  Wrap Compression Stockings Add-Ons Electronic Signature(s) Signed: 05/28/2021 3:52:56 PM By: Levora Dredge Entered By: Levora Dredge on 05/28/2021 10:13:00 Dorough, [name] ([id number]) -------------------------------------------------------------------------------- Wound Assessment Details Patient Name: [name] Date of Service: 05/28/2021 9:45 AM Medical Record Number: [id number] Patient Account Number: [id number] Date of Birth/Sex: 02/10/1966 (56 y.o. F) Treating RN: Levora Dredge Primary Care Darrious Youman: Laurance Flatten Other Clinician: Referring Bernis Stecher: Laurance Flatten Treating Zakariah Urwin/Extender: Yaakov Guthrie in Treatment: 3 Wound Status Wound Number: 9 Primary Open Surgical Wound Etiology: Wound Location: Right, Midline Foot Wound Open Wounding Event: Surgical Injury Status: Date Acquired: 03/13/2021 Notes: amputation of right great toe Weeks Of Treatment: 3 Comorbid Sleep Apnea, Congestive Heart Failure, Coronary Artery Clustered Wound: No History: Disease, Hypertension, Myocardial Infarction, Peripheral Pending Amputation On Presentation Venous Disease, Type II Diabetes, End Stage Renal Disease, Gout, Neuropathy Photos Wound Measurements Length: (cm) 1 Width: (cm) 4 Depth: (cm) 0.3 Area: (cm) 3.142 Volume: (cm) 0.942 % Reduction in Area: 40.7% % Reduction in Volume: 64.5% Epithelialization: None Tunneling: No Undermining: No Wound Description Classification: Full Thickness Without Exposed Support Structures Exudate Amount: Medium Exudate Type: Serosanguineous Exudate Color: red, brown Foul Odor After Cleansing: No Slough/Fibrino Yes Wound Bed Granulation Amount: Small (1-33%) Exposed Structure Necrotic Amount: Large (67-100%) Fat Layer (Subcutaneous Tissue) Exposed: Yes Necrotic Quality: Adherent Slough Treatment Notes Wound #9 (Foot) Wound Laterality: Right, Midline Cleanser Normal Saline Discharge Instruction: Wash your hands with soap  and water. Remove old dressing, discard into plastic bag and place into trash. Cleanse the wound with Normal Saline prior to applying a clean dressing using gauze sponges, not tissues or cotton balls. Do not scrub or use excessive force. Pat dry using gauze sponges, not tissue or cotton balls. Peri-Wound Care Zervas, Talor ([id number]) Topical Primary Dressing Hydrofera  Blue Ready Transfer Foam, 2.5x2.5 (in/in) Discharge Instruction: Apply Hydrofera Blue Ready to wound bed as directed Secondary Dressing ABD Pad 5x9 (in/in) Discharge Instruction: Cover with ABD pad Secured With [place] H Soft Cloth Surgical Tape, 2x2 (in/yd) Conform 4'' - Conforming Stretch Gauze Bandage 4x75 (in/in) Discharge Instruction: Apply as directed Compression Wrap Compression Stockings Add-Ons Electronic Signature(s) Signed: 05/28/2021 3:52:56 PM By: Levora Dredge Entered By: Levora Dredge on 05/28/2021 10:14:22 Ventrella, [name] ([id number]) -------------------------------------------------------------------------------- Vitals Details Patient Name: [name] Date of Service: 05/28/2021 9:45 AM Medical Record Number: [id number] Patient Account Number: [id number] Date of Birth/Sex: [birth year] (55 y.o. F) Treating RN: Levora Dredge Primary Care Mildred Bollard: Laurance Flatten Other Clinician: Referring Chamberlain Steinborn: Laurance Flatten Treating Ameerah Huffstetler/Extender: Yaakov Guthrie in Treatment: 3 Vital Signs Time Taken: 09:59 Temperature (F): 98.3 Pulse (bpm): 80 Respiratory Rate (breaths/min): 18 Blood Pressure (mmHg): 138/75 Reference Range: 80 - 120 mg / dl Electronic Signature(s) Signed: 05/28/2021 3:52:56 PM By: Levora Dredge Entered By: Levora Dredge on 05/28/2021 10:02:26

## 2021-06-04 ENCOUNTER — Encounter: Payer: Medicare Other | Admitting: Internal Medicine

## 2021-06-07 ENCOUNTER — Other Ambulatory Visit (HOSPITAL_COMMUNITY): Payer: Self-pay | Admitting: Cardiology

## 2021-06-11 ENCOUNTER — Encounter: Payer: Medicare Other | Admitting: Internal Medicine

## 2021-06-12 ENCOUNTER — Encounter: Payer: Medicare Other | Admitting: Cardiology

## 2021-06-12 ENCOUNTER — Other Ambulatory Visit: Payer: Self-pay

## 2021-06-12 NOTE — Addendum Note (Signed)
Addended by: Freada Bergeron on: 06/12/2021 06:49 PM ? ? Modules accepted: Orders ? ?

## 2021-06-12 NOTE — Progress Notes (Signed)
This encounter was created in error - please disregard.

## 2021-06-18 ENCOUNTER — Other Ambulatory Visit (HOSPITAL_COMMUNITY): Payer: Self-pay | Admitting: Cardiology

## 2021-06-18 ENCOUNTER — Encounter (HOSPITAL_BASED_OUTPATIENT_CLINIC_OR_DEPARTMENT_OTHER): Payer: Medicare Other | Admitting: Internal Medicine

## 2021-06-18 ENCOUNTER — Other Ambulatory Visit: Payer: Self-pay

## 2021-06-18 DIAGNOSIS — M869 Osteomyelitis, unspecified: Secondary | ICD-10-CM | POA: Diagnosis not present

## 2021-06-18 DIAGNOSIS — L97512 Non-pressure chronic ulcer of other part of right foot with fat layer exposed: Secondary | ICD-10-CM

## 2021-06-18 DIAGNOSIS — T8130XA Disruption of wound, unspecified, initial encounter: Secondary | ICD-10-CM | POA: Diagnosis not present

## 2021-06-18 DIAGNOSIS — E11621 Type 2 diabetes mellitus with foot ulcer: Secondary | ICD-10-CM

## 2021-06-18 DIAGNOSIS — E1169 Type 2 diabetes mellitus with other specified complication: Secondary | ICD-10-CM | POA: Diagnosis not present

## 2021-06-18 NOTE — Progress Notes (Signed)
[name], [name] ([id number]) ?Visit Report for 06/18/2021 ?Arrival Information Details ?Patient Name: [name], [name] ?Date of Service: 06/18/2021 10:30 AM ?Medical Record Number: [id number] ?Patient Account Number: [id number] ?Date of Birth/Sex: May 07, 1965 (55 y.o. F) ?Treating RN: Levora Dredge ?Primary Care Marlyn Rabine: Laurance Flatten Other Clinician: ?Referring Meisha Salone: Laurance Flatten ?Treating Channon Ambrosini/Extender: Kalman Shan ?Weeks in Treatment: 6 ?Visit Information History Since Last Visit ?Added or deleted any medications: No ?Patient Arrived: [name] ?Any new allergies or adverse reactions: No ?Arrival Time: 10:19 ?Had a fall or experienced change in No ?Accompanied By: family ?activities of daily living that may affect ?Transfer Assistance: None ?risk of falls: ?Patient Identification Verified: Yes ?Hospitalized since last visit: No ?Secondary Verification Process Completed: Yes ?Has Dressing in Place as Prescribed: Yes ?Patient Requires Transmission-Based Precautions: No ?Has Footwear/Offloading in Place as Prescribed: Yes ?Patient Has Alerts: Yes ?Right: Surgical Shoe with Pressure Relief ?Patient Alerts: diabetic ?Insole ?Pain Present Now: Yes ?Electronic Signature(s) ?Signed: 06/18/2021 4:00:22 PM By: Levora Dredge ?Entered By: Levora Dredge on 06/18/2021 10:28:34 ?Underdown, Rosabella ([id number]) ?-------------------------------------------------------------------------------- ?Lower Extremity Assessment Details ?Patient Name: KALEI, MEDA ?Date of Service: 06/18/2021 10:30 AM ?Medical Record Number: [id number] ?Patient Account Number: [id number] ?Date of Birth/Sex: 1965-09-15 (55 y.o. F) ?Treating RN: Levora Dredge ?Primary Care Rhyatt Muska: Laurance Flatten Other Clinician: ?Referring Lynton Crescenzo: Laurance Flatten ?Treating Valor Turberville/Extender: Kalman Shan ?Weeks in Treatment: 6 ?Edema Assessment ?Assessed: [Left: No] [Right: No] ?Edema: [Left: Ye] [Right: s] ?Calf ?Left: Right: ?Point of Measurement: 35 cm From Medial Instep  43 cm ?Ankle ?Left: Right: ?Point of Measurement: 10 cm From Medial Instep 25 cm ?Vascular Assessment ?Pulses: ?Dorsalis Pedis ?Palpable: [Left:Yes] ?Electronic Signature(s) ?Signed: 06/18/2021 4:00:22 PM By: Levora Dredge ?Entered By: Levora Dredge on 06/18/2021 10:43:05 ?Furgerson, Jaana ([id number]) ?-------------------------------------------------------------------------------- ?Multi Wound Chart Details ?Patient Name: [name], [name] ?Date of Service: 06/18/2021 10:30 AM ?Medical Record Number: [id number] ?Patient Account Number: [id number] ?Date of Birth/Sex: 04-Aug-1965 (55 y.o. F) ?Treating RN: Levora Dredge ?Primary Care Ahlivia Salahuddin: Laurance Flatten Other Clinician: ?Referring Jeraline Marcinek: Laurance Flatten ?Treating Rorie Delmore/Extender: Kalman Shan ?Weeks in Treatment: 6 ?Vital Signs ?Height(in): ?Pulse(bpm): 89 ?Weight(lbs): ?Blood Pressure(mmHg): 167/76 ?Body Mass Index(BMI): ?Temperature(??F): 97.5 ?Respiratory Rate(breaths/min): 18 ?Photos: [N/A:N/A] ?Wound Location: Right, Plantar Foot Right, Midline Foot N/A ?Wounding Event: Surgical Injury Surgical Injury N/A ?Primary Etiology: Diabetic Wound/Ulcer of the Lower Open Surgical Wound N/A ?Extremity ?Comorbid History: Sleep Apnea, Congestive Heart Sleep Apnea, Congestive Heart N/A ?Failure, Coronary Artery Disease, Failure, Coronary Artery Disease, ?Hypertension, Myocardial Infarction, Hypertension, Myocardial Infarction, ?Peripheral Venous Disease, Type II Peripheral Venous Disease, Type II ?Diabetes, End Stage Renal Disease, Diabetes, End Stage Renal Disease, ?Gout, Neuropathy Gout, Neuropathy ?Date Acquired: 03/13/2021 03/13/2021 N/A ?Weeks of Treatment: 6 6 N/A ?Wound Status: Open Open N/A ?Wound Recurrence: No No N/A ?Clustered Wound: Yes No N/A ?Clustered Quantity: 2 N/A N/A ?Pending Amputation on Yes Yes N/A ?Presentation: ?Measurements L x W x D (cm) 0.1x0.1x0.1 0.7x4x0.4 N/A ?Area (cm?) : 0.008 2.199 N/A ?Volume (cm?) : 0.001 0.88 N/A ?% Reduction in Area:  99.80% 58.50% N/A ?% Reduction in Volume: 99.70% 66.80% N/A ?Classification: Grade 2 Full Thickness Without Exposed N/A ?Support Structures ?Exudate Amount: Medium Medium N/A ?Exudate Type: Purulent Serosanguineous N/A ?Exudate Color: yellow, brown, green red, brown N/A ?Granulation Amount: Small (1-33%) Medium (34-66%) N/A ?Necrotic Amount: Large (67-100%) Medium (34-66%) N/A ?Exposed Structures: ?Fat Layer (Subcutaneous Tissue): ?Fat Layer (Subcutaneous Tissue): N/A ?Yes Yes ?Epithelialization: None None N/A ?Treatment Notes ?Electronic Signature(s) ?Signed: 06/18/2021 4:00:22 PM By: Levora Dredge ?Entered By: Levora Dredge on 06/18/2021 10:54:38 ?Rusher, Shivaun ([id number]) ?Kocourek,  Lilo ([id number]) ?-------------------------------------------------------------------------------- ?Multi-Disciplinary Care Plan Details ?Patient Name: [name], [name] ?Date of Service: 06/18/2021 10:30 AM ?Medical Record Number: [id number] ?Patient Account Number: [id number] ?Date of Birth/Sex: Aug 31, 1965 (55 y.o. F) ?Treating RN: Levora Dredge ?Primary Care Rheda Kassab: Laurance Flatten Other Clinician: ?Referring Lorenz Donley: Laurance Flatten ?Treating Akeyla Molden/Extender: Kalman Shan ?Weeks in Treatment: 6 ?Active Inactive ?Orientation to the Wound Care Program ?Nursing Diagnoses: ?Knowledge deficit related to the wound healing center program ?Goals: ?Patient/caregiver will verbalize understanding of the [place] ?Date Initiated: 05/07/2021 ?Target Resolution Date: 05/07/2021 ?Goal Status: Active ?Interventions: ?Provide education on orientation to the wound center ?Notes: ?Wound/Skin Impairment ?Nursing Diagnoses: ?Impaired tissue integrity ?Knowledge deficit related to ulceration/compromised skin integrity ?Goals: ?Ulcer/skin breakdown will have a volume reduction of 30% by week 4 ?Date Initiated: 05/07/2021 ?Target Resolution Date: 06/04/2021 ?Goal Status: Active ?Ulcer/skin breakdown will have a volume reduction of 50%  by week 8 ?Date Initiated: 05/07/2021 ?Target Resolution Date: 07/02/2021 ?Goal Status: Active ?Ulcer/skin breakdown will have a volume reduction of 80% by week 12 ?Date Initiated: 05/07/2021 ?Target Resolution Date: 07/30/2021 ?Goal Status: Active ?Ulcer/skin breakdown will heal within 14 weeks ?Date Initiated: 05/07/2021 ?Target Resolution Date: 08/13/2021 ?Goal Status: Active ?Interventions: ?Assess patient/caregiver ability to obtain necessary supplies ?Assess patient/caregiver ability to perform ulcer/skin care regimen upon admission and as needed ?Assess ulceration(s) every visit ?Provide education on ulcer and skin care ?Notes: ?Electronic Signature(s) ?Signed: 06/18/2021 4:00:22 PM By: Levora Dredge ?Entered By: Levora Dredge on 06/18/2021 10:54:26 ?Hilyer, Marly ([id number]) ?-------------------------------------------------------------------------------- ?Pain Assessment Details ?Patient Name: MAELEIGH, BUSCHMAN ?Date of Service: 06/18/2021 10:30 AM ?Medical Record Number: [id number] ?Patient Account Number: [id number] ?Date of Birth/Sex: [birth year] (55 y.o. F) ?Treating RN: Levora Dredge ?Primary Care Demaree Liberto: Laurance Flatten Other Clinician: ?Referring Blessings Inglett: Laurance Flatten ?Treating Shahira Fiske/Extender: Kalman Shan ?Weeks in Treatment: 6 ?Active Problems ?Location of Pain Severity and Description of Pain ?Patient Has Paino Yes ?Site Locations ?Rate the pain. ?Current Pain Level: 4 ?Pain Management and Medication ?Current Pain Management: ?Notes ?pt states pain in right foot ?Electronic Signature(s) ?Signed: 06/18/2021 4:00:22 PM By: Levora Dredge ?Entered By: Levora Dredge on 06/18/2021 10:29:26 ?Fetterman, Elisheba ([id number]) ?-------------------------------------------------------------------------------- ?Wound Assessment Details ?Patient Name: AYSHA, LIVECCHI ?Date of Service: 06/18/2021 10:30 AM ?Medical Record Number: [id number] ?Patient Account Number: [id number] ?Date of Birth/Sex: 1965/11/20 (55 y.o.  F) ?Treating RN: Levora Dredge ?Primary Care Tabitha [name]: Laurance Flatten Other Clinician: ?Referring Shakirah Kirkey: Laurance Flatten ?Treating Eddy Termine/Extender: Kalman Shan ?Weeks in Treatment: 6 ?Wound Status ?Wound

## 2021-06-18 NOTE — Progress Notes (Signed)
Frigon, Maybree ([id number]) ?Visit Report for 06/18/2021 ?Chief Complaint Document Details ?Patient Name: [name], [name] ?Date of Service: 06/18/2021 10:30 AM ?Medical Record Number: [id number] ?Patient Account Number: [id number] ?Date of Birth/Sex: 1966/01/19 (55 y.o. 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 ?Electronic Signature(s) ?Signed: 06/18/2021 11:09:35 AM By: Kalman Shan DO ?Entered By: Kalman Shan on 06/18/2021 11:03:43 ?[name], [name] ([id number]) ?-------------------------------------------------------------------------------- ?Debridement Details ?Patient Name: [name], [name] ?Date of Service: 06/18/2021 10:30 AM ?Medical Record Number: [id number] ?Patient Account Number: [id number] ?Date of Birth/Sex: February 22, 1966 (55 y.o. F) ?Treating RN: Levora Dredge ?Primary Care Provider: Laurance Flatten Other Clinician: ?Referring Provider: Laurance Flatten ?Treating Provider/Extender: Kalman Shan ?Weeks in Treatment: 6 ?Debridement Performed for ?Wound #9 Right,Midline Foot ?Assessment: ?Performed By: Physician Kalman Shan, MD ?Debridement Type: Debridement ?Level of Consciousness (Pre- ?Awake and Alert ?procedure): ?Pre-procedure Verification/Time Out ?Yes - 10:55 ?Taken: ?Total Area Debrided (L x W): 0.7 (cm) x 4 (cm) = 2.8 (cm?) ?Tissue and other material ?Viable, Non-Viable, Slough, Subcutaneous, Reynolds ?debrided: ?Level: Skin/Subcutaneous Tissue ?Debridement Description: Excisional ?Instrument: Curette ?Bleeding: Minimum ?Hemostasis Achieved: Pressure ?Response to Treatment: Procedure was tolerated well ?Level of Consciousness (Post- ?Awake and Alert ?procedure): ?Post Debridement Measurements of Total Wound ?Length: (cm)  0.7 ?Width: (cm) 4 ?Depth: (cm) 0.4 ?Volume: (cm?) 0.88 ?Character of Wound/Ulcer Post Debridement: Stable ?Post Procedure Diagnosis ?Same as Pre-procedure ?Electronic Signature(s) ?Signed: 06/18/2021 11:09:35 AM By: Kalman Shan DO ?Signed: 06/18/2021 4:00:22 PM By: Levora Dredge ?Entered By: Levora Dredge on 06/18/2021 10:57:02 ?[name], [name] ([id number]) ?-------------------------------------------------------------------------------- ?HPI Details ?Patient Name: [name], [name] ?Date of Service: 06/18/2021 10:30 AM ?Medical Record Number: [id number] ?Patient Account Number: [id number] ?Date of Birth/Sex: 04-10-1965 (55 y.o. F) ?Treating RN: Levora Dredge ?Primary Care Provider: Laurance Flatten Other Clinician: ?Referring Provider: Laurance Flatten ?Treating Provider/Extender: Kalman Shan ?Weeks in Treatment: 6 ?History of Present Illness ?HPI Description: 10/24/2018 on evaluation today patient presents for initial evaluation or clinic concerning issues that she has been having with ?lymphedema for quite some time. Unfortunately she has several wound openings at this point that secondary to her lymphedema/venous stasis ?are giving her trouble and leaking quite severely. She also has diabetes along with hypertension and stage III chronic kidney disease. Fortunately ?there is no signs of active infection at this time. She is going to likely require debridement of the left leg ulcer upon evaluation today just based on ?what I am seeing. Fortunately there is no wound opening on the right. Overall the patient seems to be doing quite well and again there is no ?evidence of systemic infection which is good news. No fevers, chills, nausea, vomiting, or diarrhea. ?10/31/2018 on evaluation today patient actually appears to be doing excellent in regard to her lower extremity ulcers. She has been tolerating the ?dressing changes without complication. Fortunately there is no signs of active infection at this time. She has  tolerated 3 layer compression wrap ?without complication. ?11/07/2018 upon evaluation today patient actually appears to be doing very well with regard to her left lower extremity ulcers. She has been ?tolerating the dressing changes without complication. Fortunately there is no signs of active infection. No fevers, chills, nausea, vomiting, or ?diarrhea. ?11/21/2018 upon evaluation today patient appears to be doing quite well with regard to her lower extremity ulcers. In fact both areas seem to be ?showing signs of good improvement which  is excellent. She is not having as much pain as she has in the past and again has a lot of healing ?compared to previous visits as well. ?12/05/2018 on evaluation today patient presents for follow-up concerning her ongoing issues with her bilateral lower extremity ulcers. The good ?news is her right lower extremity is showing signs of completely healing at this time which is great news. Fortunately there is no evidence of ?active infection. On the left she has just a very small area still remaining that is open at this time all in all she is very close to complete closure in ?my opinion. She does have compression stockings to wear at home. ?12/12/2018 on evaluation today patient appears to be doing excellent in regard to her left lateral lower extremity ulcer. She has been tolerating ?the dressing changes without complication. Fortunately there is no signs of active infection at this time. In fact this appears to be pretty much ?healed at this point although again she is not 100% today. No fevers, chills, nausea, vomiting, or diarrhea. ?12/19/2018 on evaluation today patient actually appears to be doing excellent with regard to her lower extremity ulcer in fact this appears to be ?completely healed today which is all some. She has done extremely well with wound care measures. No fevers, chills, nausea, vomiting, or ?diarrhea. ?------------------------------------ ?11/01/19-Readmission to  the clinic ?Patient presents with left leg pain, wounds posterior calf, onset about 4 weeks, denies any fevers chills or shakes, has not been using anything to ?these wounds, was given compression stockings at last discharge from the clinic but has not been able to use them as they rolled down and cause ?creases ?Patient's history significant for type 2 diabetes insulin requiring, A1c of 6.9 lately, hypertension, chronic pain ?8/18; patient readmitted that the clinic last week. She has wounds on her left lateral posterior lower leg all of this in close juxtaposition i.e. a ?localized site. We've been using silver alginate under 3 layer compression apparently the wound surface area is better. She was wearing ?compression stockings from elastic therapy but says they were falling down. As she progresses more towards healing will need to address what ?we use in terms of compression stockings perhaps external compression garments ?11/17/2019 on evaluation today patient appears to be doing well with regard to her legs currently. She does have 2 wounds which are actually ?measuring much better the more posterior is actually doing very well which I am pleased with the other though smaller is not quite a small but ?nonetheless on the lateral aspect does seem to be improving. ?11/23/2019 on evaluation today patient appears to be doing well in regard to her wounds. In fact the wound on the posterior aspect of her leg ?appears healed the lateral aspect is still open but extremely small. ?9/15; this is a patient with chronic venous insufficiency and secondary lymphedema. She did not keep her clinic appointment last week and hence ?the left leg is a lot more swollen since she had to take the wrap off at some point. She has a small weeping area on the left lateral lower leg. She ?has probably a juxta lite stocking for the left leg I have asked her to bring that in when she comes to her clinic appointment next week at which ?time she  should be healed ?10/6; this is a patient with chronic venous insufficiency and secondary lymphedema left greater than right. Skin changes of chronic lymphedema ?especially in the left leg. She has not been here in  3

## 2021-06-25 ENCOUNTER — Encounter: Payer: Medicare Other | Attending: Internal Medicine | Admitting: Internal Medicine

## 2021-06-25 DIAGNOSIS — I2510 Atherosclerotic heart disease of native coronary artery without angina pectoris: Secondary | ICD-10-CM | POA: Diagnosis not present

## 2021-06-25 DIAGNOSIS — E11621 Type 2 diabetes mellitus with foot ulcer: Secondary | ICD-10-CM | POA: Diagnosis present

## 2021-06-25 DIAGNOSIS — M869 Osteomyelitis, unspecified: Secondary | ICD-10-CM | POA: Insufficient documentation

## 2021-06-25 DIAGNOSIS — N186 End stage renal disease: Secondary | ICD-10-CM | POA: Insufficient documentation

## 2021-06-25 DIAGNOSIS — G4733 Obstructive sleep apnea (adult) (pediatric): Secondary | ICD-10-CM | POA: Insufficient documentation

## 2021-06-25 DIAGNOSIS — L97512 Non-pressure chronic ulcer of other part of right foot with fat layer exposed: Secondary | ICD-10-CM | POA: Insufficient documentation

## 2021-06-25 DIAGNOSIS — E1169 Type 2 diabetes mellitus with other specified complication: Secondary | ICD-10-CM | POA: Insufficient documentation

## 2021-06-25 DIAGNOSIS — T8130XA Disruption of wound, unspecified, initial encounter: Secondary | ICD-10-CM | POA: Diagnosis not present

## 2021-06-25 DIAGNOSIS — X58XXXA Exposure to other specified factors, initial encounter: Secondary | ICD-10-CM | POA: Diagnosis not present

## 2021-06-25 DIAGNOSIS — E1122 Type 2 diabetes mellitus with diabetic chronic kidney disease: Secondary | ICD-10-CM | POA: Diagnosis not present

## 2021-06-25 DIAGNOSIS — I509 Heart failure, unspecified: Secondary | ICD-10-CM | POA: Diagnosis not present

## 2021-06-25 DIAGNOSIS — I132 Hypertensive heart and chronic kidney disease with heart failure and with stage 5 chronic kidney disease, or end stage renal disease: Secondary | ICD-10-CM | POA: Diagnosis not present

## 2021-06-25 DIAGNOSIS — Z89411 Acquired absence of right great toe: Secondary | ICD-10-CM | POA: Diagnosis not present

## 2021-06-26 NOTE — Progress Notes (Signed)
Ditto, Kyung ([id number]) ?Visit Report for 06/25/2021 ?Arrival Information Details ?Patient Name: [name] [name], [name] [name] ?Date of Service: 06/25/2021 1:15 PM ?Medical Record Number: [id number] ?Patient Account Number: [id number] ?Date of Birth/Sex: 1965/05/28 (55 y.o. F) ?Treating RN: Carlene Coria ?Primary Care Afrika Brick: Laurance Flatten Other Clinician: ?Referring Yandel Zeiner: Laurance Flatten ?Treating Kentravious Lipford/Extender: Kalman Shan ?Weeks in Treatment: 7 ?Visit Information History Since Last Visit ?All ordered tests and consults were completed: No ?Patient Arrived: [name] [name] ?Added or deleted any medications: No ?Arrival Time: 13:33 ?Any new allergies or adverse reactions: No ?Accompanied By: self ?Had a fall or experienced change in No ?Transfer Assistance: None ?activities of daily living that may affect ?Patient Identification Verified: Yes ?risk of falls: ?Secondary Verification Process Completed: Yes ?Signs or symptoms of abuse/neglect since last visito No ?Patient Requires Transmission-Based Precautions: No ?Hospitalized since last visit: No ?Patient Has Alerts: Yes ?Implantable device outside of the clinic excluding No ?Patient Alerts: diabetic ?cellular tissue based products placed in the center ?since last visit: ?Has Dressing in Place as Prescribed: Yes ?Pain Present Now: No ?Electronic Signature(s) ?Signed: 06/26/2021 9:23:23 AM By: Carlene Coria RN ?Entered By: Carlene Coria on 06/25/2021 13:37:29 ?[name] [name] ([id number]) ?-------------------------------------------------------------------------------- ?Clinic Level of Care Assessment Details ?Patient Name: [name] [name], [name] [name] ?Date of Service: 06/25/2021 1:15 PM ?Medical Record Number: [id number] ?Patient Account Number: [id number] ?Date of Birth/Sex: November 15, 1965 (55 y.o. F) ?Treating RN: Carlene Coria ?Primary Care Banessa Mao: Laurance Flatten Other Clinician: ?Referring Wolfgang Finigan: Laurance Flatten ?Treating Geneviene Tesch/Extender: Kalman Shan ?Weeks in Treatment: 7 ?Clinic Level of Care Assessment  Items ?TOOL 4 Quantity Score ?'[]'$  - Use when only an EandM is performed on FOLLOW-UP visit 0 ?ASSESSMENTS - Nursing Assessment / Reassessment ?'[]'$  - Reassessment of Co-morbidities (includes updates in patient status) 0 ?'[]'$  - 0 ?Reassessment of Adherence to Treatment Plan ?ASSESSMENTS - Wound and Skin Assessment / Reassessment ?'[]'$  - Simple Wound Assessment / Reassessment - one wound 0 ?'[]'$  - 0 ?Complex Wound Assessment / Reassessment - multiple wounds ?'[]'$  - 0 ?Dermatologic / Skin Assessment (not related to wound area) ?ASSESSMENTS - Focused Assessment ?'[]'$  - Circumferential Edema Measurements - multi extremities 0 ?'[]'$  - 0 ?Nutritional Assessment / Counseling / Intervention ?'[]'$  - 0 ?Lower Extremity Assessment (monofilament, tuning fork, pulses) ?'[]'$  - 0 ?Peripheral Arterial Disease Assessment (using hand held doppler) ?ASSESSMENTS - Ostomy and/or Continence Assessment and Care ?'[]'$  - Incontinence Assessment and Management 0 ?'[]'$  - 0 ?Ostomy Care Assessment and Management (repouching, etc.) ?PROCESS - Coordination of Care ?'[]'$  - Simple Patient / Family Education for ongoing care 0 ?'[]'$  - 0 ?Complex (extensive) Patient / Family Education for ongoing care ?'[]'$  - 0 ?Staff obtains Consents, Records, Test Results / Process Orders ?'[]'$  - 0 ?Staff telephones HHA, Nursing Homes / Clarify orders / etc ?'[]'$  - 0 ?Routine Transfer to another Facility (non-emergent condition) ?'[]'$  - 0 ?Routine Hospital Admission (non-emergent condition) ?'[]'$  - 0 ?New Admissions / Biomedical engineer / Ordering NPWT, Apligraf, etc. ?'[]'$  - 0 ?Emergency Hospital Admission (emergent condition) ?'[]'$  - 0 ?Simple Discharge Coordination ?'[]'$  - 0 ?Complex (extensive) Discharge Coordination ?PROCESS - Special Needs ?'[]'$  - Pediatric / Minor Patient Management 0 ?'[]'$  - 0 ?Isolation Patient Management ?'[]'$  - 0 ?Hearing / Language / Visual special needs ?'[]'$  - 0 ?Assessment of Community assistance (transportation, D/C planning, etc.) ?'[]'$  - 0 ?Additional assistance / Altered  mentation ?'[]'$  - 0 ?Support Surface(s) Assessment (bed, cushion, seat, etc.) ?INTERVENTIONS - Wound Cleansing / Measurement ?[name] [name] ([id number]) ?'[]'$  - 0 ?Simple Wound Cleansing - one wound ?'[]'$  - 0 ?Complex  Wound Cleansing - multiple wounds ?'[]'$  - 0 ?Wound Imaging (photographs - any number of wounds) ?'[]'$  - 0 ?Wound Tracing (instead of photographs) ?'[]'$  - 0 ?Simple Wound Measurement - one wound ?'[]'$  - 0 ?Complex Wound Measurement - multiple wounds ?INTERVENTIONS - Wound Dressings ?'[]'$  - Small Wound Dressing one or multiple wounds 0 ?'[]'$  - 0 ?Medium Wound Dressing one or multiple wounds ?'[]'$  - 0 ?Large Wound Dressing one or multiple wounds ?'[]'$  - 0 ?Application of Medications - topical ?'[]'$  - 0 ?Application of Medications - injection ?INTERVENTIONS - Miscellaneous ?'[]'$  - External ear exam 0 ?'[]'$  - 0 ?Specimen Collection (cultures, biopsies, blood, body fluids, etc.) ?'[]'$  - 0 ?Specimen(s) / Culture(s) sent or taken to Lab for analysis ?'[]'$  - 0 ?Patient Transfer (multiple staff / Civil Service fast streamer / Similar devices) ?'[]'$  - 0 ?Simple Staple / Suture removal (25 or less) ?'[]'$  - 0 ?Complex Staple / Suture removal (26 or more) ?'[]'$  - 0 ?Hypo / Hyperglycemic Management (close monitor of Blood Glucose) ?'[]'$  - 0 ?Ankle / Brachial Index (ABI) - do not check if billed separately ?'[]'$  - 0 ?Vital Signs ?Has the patient been seen at the hospital within the last three years: Yes ?Total Score: 0 ?Level Of Care: ____ ?Electronic Signature(s) ?Signed: 06/26/2021 9:23:23 AM By: Carlene Coria RN ?Entered By: Carlene Coria on 06/25/2021 16:23:31 ?[name] [name] ([id number]) ?-------------------------------------------------------------------------------- ?Encounter Discharge Information Details ?Patient Name: [name] [name], [name] [name] ?Date of Service: 06/25/2021 1:15 PM ?Medical Record Number: [id number] ?Patient Account Number: [id number] ?Date of Birth/Sex: 1966-03-19 (55 y.o. F) ?Treating RN: Carlene Coria ?Primary Care Yehudit Fulginiti: Laurance Flatten Other Clinician: ?Referring  Baylei Siebels: Laurance Flatten ?Treating Rocco Kerkhoff/Extender: Kalman Shan ?Weeks in Treatment: 7 ?Encounter Discharge Information Items Post Procedure Vitals ?Discharge Condition: Stable ?Temperature (?F): 98.5 ?Ambulatory Status: Ambulatory ?Pulse (bpm): 80 ?Discharge Destination: Home ?Respiratory Rate (breaths/min): 18 ?Transportation: Private Auto ?Blood Pressure (mmHg): 185/75 ?Accompanied By: self ?Schedule Follow-up Appointment: Yes ?Clinical Summary of Care: Patient Declined ?Electronic Signature(s) ?Signed: 06/25/2021 4:26:06 PM By: Carlene Coria RN ?Entered By: Carlene Coria on 06/25/2021 16:26:05 ?Akerson, [name] ([id number]) ?-------------------------------------------------------------------------------- ?Lower Extremity Assessment Details ?Patient Name: [name] [name], [name] [name] ?Date of Service: 06/25/2021 1:15 PM ?Medical Record Number: [id number] ?Patient Account Number: [id number] ?Date of Birth/Sex: [birth year] (55 y.o. F) ?Treating RN: Carlene Coria ?Primary Care Estel Scholze: Laurance Flatten Other Clinician: ?Referring Miracle Criado: Laurance Flatten ?Treating Jamonte Curfman/Extender: Kalman Shan ?Weeks in Treatment: 7 ?Edema Assessment ?Assessed: [Left: No] [Right: No] ?Edema: [Left: Ye] [Right: s] ?Calf ?Left: Right: ?Point of Measurement: 35 cm From Medial Instep 43 cm ?Ankle ?Left: Right: ?Point of Measurement: 10 cm From Medial Instep 25 cm ?Vascular Assessment ?Pulses: ?Dorsalis Pedis ?Palpable: [Left:Yes] ?Electronic Signature(s) ?Signed: 06/26/2021 9:23:23 AM By: Carlene Coria RN ?Entered By: Carlene Coria on 06/25/2021 13:44:20 ?Million, [name] ([id number]) ?-------------------------------------------------------------------------------- ?Multi Wound Chart Details ?Patient Name: [name] [name], [name] [name] ?Date of Service: 06/25/2021 1:15 PM ?Medical Record Number: [id number] ?Patient Account Number: [id number] ?Date of Birth/Sex: 09/23/1965 (55 y.o. F) ?Treating RN: Carlene Coria ?Primary Care Trana Ressler: Laurance Flatten Other Clinician: ?Referring [name] [name]:  Laurance Flatten ?Treating Johnthomas Lader/Extender: Kalman Shan ?Weeks in Treatment: 7 ?Vital Signs ?Height(in): ?Pulse(bpm): 80 ?Weight(lbs): ?Blood Pressure(mmHg): 185/75 ?Body Mass Index(BMI): ?Temperature(??F)

## 2021-07-02 ENCOUNTER — Ambulatory Visit: Payer: Medicare Other | Admitting: Internal Medicine

## 2021-07-06 ENCOUNTER — Other Ambulatory Visit (HOSPITAL_COMMUNITY): Payer: Self-pay | Admitting: Cardiology

## 2021-07-09 ENCOUNTER — Encounter (HOSPITAL_BASED_OUTPATIENT_CLINIC_OR_DEPARTMENT_OTHER): Payer: Medicare Other | Admitting: Internal Medicine

## 2021-07-09 DIAGNOSIS — L97512 Non-pressure chronic ulcer of other part of right foot with fat layer exposed: Secondary | ICD-10-CM | POA: Diagnosis not present

## 2021-07-09 DIAGNOSIS — E11621 Type 2 diabetes mellitus with foot ulcer: Secondary | ICD-10-CM | POA: Diagnosis not present

## 2021-07-09 DIAGNOSIS — T8130XA Disruption of wound, unspecified, initial encounter: Secondary | ICD-10-CM

## 2021-07-09 NOTE — Progress Notes (Signed)
[name] ([id number]) ?Visit Report for 06/25/2021 ?Chief Complaint Document Details ?Patient Name: [name], [name] ?Date of Service: 06/25/2021 1:15 PM ?Medical Record Number: [id number] ?Patient Account Number: [id number] ?Date of Birth/Sex: 10-21-1965 (56 y.o. 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 ?Electronic Signature(s) ?Signed: 06/25/2021 3:15:14 PM By: Kalman Shan DO ?Entered By: Kalman Shan on 06/25/2021 14:04:49 ?Pullin, Charitie ([id number]) ?-------------------------------------------------------------------------------- ?Debridement Details ?Patient Name: [name] ?Date of Service: 06/25/2021 1:15 PM ?Medical Record Number: [id number] ?Patient Account Number: [id number] ?Date of Birth/Sex: 10/05/1965 (56 y.o. F) ?Treating RN: Carlene Coria ?Primary Care Provider: Laurance Flatten Other Clinician: ?Referring Provider: Laurance Flatten ?Treating Provider/Extender: Kalman Shan ?Weeks in Treatment: 7 ?Debridement Performed for ?Wound #9 Right,Midline Foot ?Assessment: ?Performed By: Physician Kalman Shan, MD ?Debridement Type: Debridement ?Level of Consciousness (Pre- ?Awake and Alert ?procedure): ?Pre-procedure Verification/Time Out ?Yes - 13:40 ?Taken: ?Start Time: 13:40 ?Total Area Debrided (L x W): 1 (cm) x 3.5 (cm) = 3.5 (cm?) ?Tissue and other material ?Viable, Non-Viable, [place], Subcutaneous, [place] ?debrided: ?Level: Skin/Subcutaneous Tissue ?Debridement Description: Excisional ?Instrument: Curette ?Bleeding: Minimum ?Hemostasis Achieved: Pressure ?End Time: 13:45 ?Procedural Pain: 0 ?Post Procedural Pain: 0 ?Response to Treatment: Procedure was tolerated well ?Level of Consciousness (Post- ?Awake and  Alert ?procedure): ?Post Debridement Measurements of Total Wound ?Length: (cm) 1 ?Width: (cm) 3.5 ?Depth: (cm) 0.2 ?Volume: (cm?) 0.55 ?Character of Wound/Ulcer Post Debridement: Improved ?Post Procedure Diagnosis ?Same as Pre-procedure ?Electronic Signature(s) ?Signed: 06/25/2021 4:19:20 PM By: Carlene Coria RN ?Signed: 06/25/2021 4:22:23 PM By: Kalman Shan DO ?Entered By: Carlene Coria on 06/25/2021 16:19:19 ?Skora, Jalesha ([id number]) ?-------------------------------------------------------------------------------- ?HPI Details ?Patient Name: [name] ?Date of Service: 06/25/2021 1:15 PM ?Medical Record Number: [id number] ?Patient Account Number: [id number] ?Date of Birth/Sex: [birth year] (56 y.o. F) ?Treating RN: Carlene Coria ?Primary Care Provider: Laurance Flatten Other Clinician: ?Referring Provider: Laurance Flatten ?Treating Provider/Extender: Kalman Shan ?Weeks in Treatment: 7 ?History of Present Illness ?HPI Description: 10/24/2018 on evaluation today patient presents for initial evaluation or clinic concerning issues that she has been having with ?lymphedema for quite some time. Unfortunately she has several wound openings at this point that secondary to her lymphedema/venous stasis ?are giving her trouble and leaking quite severely. She also has diabetes along with hypertension and stage III chronic kidney disease. Fortunately ?there is no signs of active infection at this time. She is going to likely require debridement of the left leg ulcer upon evaluation today just based on ?what I am seeing. Fortunately there is no wound opening on the right. Overall the patient seems to be doing quite well and again there is no ?evidence of systemic infection which is good news. No fevers, chills, nausea, vomiting, or diarrhea. ?10/31/2018 on evaluation today patient actually appears to be doing excellent in regard to her lower extremity ulcers. She has been tolerating the ?dressing changes without complication.  Fortunately there is no signs of active infection at this time. She has tolerated 3 layer compression wrap ?without complication. ?11/07/2018 upon evaluation today patient actually appears to be doing very well with regard to her left lower extremity ulcers. She has been ?tolerating the dressing changes without complication. Fortunately there is no signs of active infection. No fevers, chills, nausea, vomiting, or ?diarrhea. ?11/21/2018 upon evaluation today patient appears to be doing quite well with regard to her lower extremity  ulcers. In fact both areas seem to be ?showing signs of good improvement which is excellent. She is not having as much pain as she has in the past and again has a lot of healing ?compared to previous visits as well. ?12/05/2018 on evaluation today patient presents for follow-up concerning her ongoing issues with her bilateral lower extremity ulcers. The good ?news is her right lower extremity is showing signs of completely healing at this time which is great news. Fortunately there is no evidence of ?active infection. On the left she has just a very small area still remaining that is open at this time all in all she is very close to complete closure in ?my opinion. She does have compression stockings to wear at home. ?12/12/2018 on evaluation today patient appears to be doing excellent in regard to her left lateral lower extremity ulcer. She has been tolerating ?the dressing changes without complication. Fortunately there is no signs of active infection at this time. In fact this appears to be pretty much ?healed at this point although again she is not 100% today. No fevers, chills, nausea, vomiting, or diarrhea. ?12/19/2018 on evaluation today patient actually appears to be doing excellent with regard to her lower extremity ulcer in fact this appears to be ?completely healed today which is all some. She has done extremely well with wound care measures. No fevers, chills, nausea, vomiting,  or ?diarrhea. ?------------------------------------ ?11/01/19-Readmission to the clinic ?Patient presents with left leg pain, wounds posterior calf, onset about 4 weeks, denies any fevers chills or shakes, has not been using anything to ?these wounds, was given compression stockings at last discharge from the clinic but has not been able to use them as they rolled down and cause ?creases ?Patient's history significant for type 2 diabetes insulin requiring, A1c of 6.9 lately, hypertension, chronic pain ?8/18; patient readmitted that the clinic last week. She has wounds on her left lateral posterior lower leg all of this in close juxtaposition i.e. a ?localized site. We've been using silver alginate under 3 layer compression apparently the wound surface area is better. She was wearing ?compression stockings from elastic therapy but says they were falling down. As she progresses more towards healing will need to address what ?we use in terms of compression stockings perhaps external compression garments ?11/17/2019 on evaluation today patient appears to be doing well with regard to her legs currently. She does have 2 wounds which are actually ?measuring much better the more posterior is actually doing very well which I am pleased with the other though smaller is not quite a small but ?nonetheless on the lateral aspect does seem to be improving. ?11/23/2019 on evaluation today patient appears to be doing well in regard to her wounds. In fact the wound on the posterior aspect of her leg ?appears healed the lateral aspect is still open but extremely small. ?9/15; this is a patient with chronic venous insufficiency and secondary lymphedema. She did not keep her clinic appointment last week and hence ?the left leg is a lot more swollen since she had to take the wrap off at some point. She has a small weeping area on the left lateral lower leg. She ?has probably a juxta lite stocking for the left leg I have asked her to bring  that in when she comes to her clinic appointment next week at which ?time she should be healed ?10/6; this is a patient with chronic venous insufficiency and secondary lymphedema left greater than right. Skin changes  of chronic lymphede

## 2021-07-11 NOTE — Progress Notes (Signed)
Man, Deseri ([id number]) ?Visit Report for 07/09/2021 ?Arrival Information Details ?Patient Name: [name], [name] ?Date of Service: 07/09/2021 1:15 PM ?Medical Record Number: [id number] ?Patient Account Number: [id number] ?Date of Birth/Sex: 01/03/1966 (55 y.o. F) ?Treating RN: Carlene Coria ?Primary Care Aayra Hornbaker: Laurance Flatten Other Clinician: ?Referring Kimiko Common: Laurance Flatten ?Treating Ember Gottwald/Extender: Kalman Shan ?Weeks in Treatment: 9 ?Visit Information History Since Last Visit ?All ordered tests and consults were completed: No ?Patient Arrived: Ambulatory ?Added or deleted any medications: No ?Arrival Time: 13:34 ?Any new allergies or adverse reactions: No ?Accompanied By: self ?Had a fall or experienced change in No ?Transfer Assistance: None ?activities of daily living that may affect ?Patient Identification Verified: Yes ?risk of falls: ?Secondary Verification Process Completed: Yes ?Signs or symptoms of abuse/neglect since last visito No ?Patient Requires Transmission-Based Precautions: No ?Hospitalized since last visit: No ?Patient Has Alerts: Yes ?Implantable device outside of the clinic excluding No ?Patient Alerts: diabetic ?cellular tissue based products placed in the center ?since last visit: ?Has Dressing in Place as Prescribed: Yes ?Pain Present Now: No ?Electronic Signature(s) ?Signed: 07/11/2021 4:04:50 PM By: Carlene Coria RN ?Entered By: Carlene Coria on 07/09/2021 13:37:22 ?Vlachos, Kyleen ([id number]) ?-------------------------------------------------------------------------------- ?Clinic Level of Care Assessment Details ?Patient Name: [name], [name] ?Date of Service: 07/09/2021 1:15 PM ?Medical Record Number: [id number] ?Patient Account Number: [id number] ?Date of Birth/Sex: 04-24-1965 (55 y.o. F) ?Treating RN: Carlene Coria ?Primary Care Vander Kueker: Laurance Flatten Other Clinician: ?Referring Tanis Hensarling: Laurance Flatten ?Treating Jona Zappone/Extender: Kalman Shan ?Weeks in Treatment: 9 ?Clinic Level of Care  Assessment Items ?TOOL 1 Quantity Score ?'[]'$  - Use when EandM and Procedure is performed on INITIAL visit 0 ?ASSESSMENTS - Nursing Assessment / Reassessment ?'[]'$  - General Physical Exam (combine w/ comprehensive assessment (listed just below) when performed on new ?0 ?pt. evals) ?'[]'$  - 0 ?Comprehensive Assessment (HX, ROS, Risk Assessments, Wounds Hx, etc.) ?ASSESSMENTS - Wound and Skin Assessment / Reassessment ?'[]'$  - Dermatologic / Skin Assessment (not related to wound area) 0 ?ASSESSMENTS - Ostomy and/or Continence Assessment and Care ?'[]'$  - Incontinence Assessment and Management 0 ?'[]'$  - 0 ?Ostomy Care Assessment and Management (repouching, etc.) ?PROCESS - Coordination of Care ?'[]'$  - Simple Patient / Family Education for ongoing care 0 ?'[]'$  - 0 ?Complex (extensive) Patient / Family Education for ongoing care ?'[]'$  - 0 ?Staff obtains Consents, Records, Test Results / Process Orders ?'[]'$  - 0 ?Staff telephones HHA, Nursing Homes / Clarify orders / etc ?'[]'$  - 0 ?Routine Transfer to another Facility (non-emergent condition) ?'[]'$  - 0 ?Routine Hospital Admission (non-emergent condition) ?'[]'$  - 0 ?New Admissions / Biomedical engineer / Ordering NPWT, Apligraf, etc. ?'[]'$  - 0 ?Emergency Hospital Admission (emergent condition) ?PROCESS - Special Needs ?'[]'$  - Pediatric / Minor Patient Management 0 ?'[]'$  - 0 ?Isolation Patient Management ?'[]'$  - 0 ?Hearing / Language / Visual special needs ?'[]'$  - 0 ?Assessment of Community assistance (transportation, D/C planning, etc.) ?'[]'$  - 0 ?Additional assistance / Altered mentation ?'[]'$  - 0 ?Support Surface(s) Assessment (bed, cushion, seat, etc.) ?INTERVENTIONS - Miscellaneous ?'[]'$  - External ear exam 0 ?'[]'$  - 0 ?Patient Transfer (multiple staff / Civil Service fast streamer / Similar devices) ?'[]'$  - 0 ?Simple Staple / Suture removal (25 or less) ?'[]'$  - 0 ?Complex Staple / Suture removal (26 or more) ?'[]'$  - 0 ?Hypo/Hyperglycemic Management (do not check if billed separately) ?'[]'$  - 0 ?Ankle / Brachial Index (ABI) - do not  check if billed separately ?Has the patient been seen at the hospital within the last three years: Yes ?Total Score: 0 ?Level Of  Care: ____ ?Combes, Tahnee ([id number]) ?Electronic Signature(s) ?Signed: 07/11/2021 4:04:50 PM By: Carlene Coria RN ?Entered By: Carlene Coria on 07/09/2021 13:54:19 ?Ho, Chee ([id number]) ?-------------------------------------------------------------------------------- ?Encounter Discharge Information Details ?Patient Name: [name], [name] ?Date of Service: 07/09/2021 1:15 PM ?Medical Record Number: [id number] ?Patient Account Number: [id number] ?Date of Birth/Sex: April 26, 1965 (55 y.o. F) ?Treating RN: Carlene Coria ?Primary Care Ezekial Arns: Laurance Flatten Other Clinician: ?Referring Saahir Prude: Laurance Flatten ?Treating Kaleb Linquist/Extender: Kalman Shan ?Weeks in Treatment: 9 ?Encounter Discharge Information Items Post Procedure Vitals ?Discharge Condition: Stable ?Temperature (?F): 98.2 ?Ambulatory Status: Ambulatory ?Pulse (bpm): 87 ?Discharge Destination: Home ?Respiratory Rate (breaths/min): 18 ?Transportation: Private Auto ?Blood Pressure (mmHg): 157/61 ?Accompanied By: self ?Schedule Follow-up Appointment: Yes ?Clinical Summary of Care: Patient Declined ?Electronic Signature(s) ?Signed: 07/11/2021 4:04:50 PM By: Carlene Coria RN ?Entered By: Carlene Coria on 07/09/2021 13:57:38 ?Perillo, Tziporah ([id number]) ?-------------------------------------------------------------------------------- ?Lower Extremity Assessment Details ?Patient Name: [name], [name] ?Date of Service: 07/09/2021 1:15 PM ?Medical Record Number: [id number] ?Patient Account Number: [id number] ?Date of Birth/Sex: 09/13/1965 (55 y.o. F) ?Treating RN: Carlene Coria ?Primary Care Sonny Poth: Laurance Flatten Other Clinician: ?Referring Kyrian Stage: Laurance Flatten ?Treating Adriane Gabbert/Extender: Kalman Shan ?Weeks in Treatment: 9 ?Edema Assessment ?Assessed: [Left: No] [Right: No] ?Edema: [Left: Ye] [Right: s] ?Calf ?Left: Right: ?Point of Measurement:  35 cm From Medial Instep 43 cm ?Ankle ?Left: Right: ?Point of Measurement: 10 cm From Medial Instep 24 cm ?Vascular Assessment ?Pulses: ?Dorsalis Pedis ?Palpable: [Left:Yes] ?Electronic Signature(s) ?Signed: 07/11/2021 4:04:50 PM By: Carlene Coria RN ?Entered By: Carlene Coria on 07/09/2021 13:43:59 ?Klecka, [place] ([id number]) ?-------------------------------------------------------------------------------- ?Multi Wound Chart Details ?Patient Name: [name], [name] ?Date of Service: 07/09/2021 1:15 PM ?Medical Record Number: [id number] ?Patient Account Number: [id number] ?Date of Birth/Sex: 12/23/1965 (55 y.o. F) ?Treating RN: Carlene Coria ?Primary Care Awais Cobarrubias: Laurance Flatten Other Clinician: ?Referring Christoper Bushey: Laurance Flatten ?Treating Genevieve Arbaugh/Extender: Kalman Shan ?Weeks in Treatment: 9 ?Vital Signs ?Height(in): ?Pulse(bpm): 87 ?Weight(lbs): ?Blood Pressure(mmHg): 157/61 ?Body Mass Index(BMI): ?Temperature(??F): 98.2 ?Respiratory Rate(breaths/min): 18 ?Photos: [N/A:N/A] ?Wound Location: Right, Plantar Foot Right, Midline Foot N/A ?Wounding Event: Surgical Injury Surgical Injury N/A ?Primary Etiology: Diabetic Wound/Ulcer of the Lower Open Surgical Wound N/A ?Extremity ?Comorbid History: Sleep Apnea, Congestive Heart Sleep Apnea, Congestive Heart N/A ?Failure, Coronary Artery Disease, Failure, Coronary Artery Disease, ?Hypertension, Myocardial Infarction, Hypertension, Myocardial Infarction, ?Peripheral Venous Disease, Type II Peripheral Venous Disease, Type II ?Diabetes, End Stage Renal Disease, Diabetes, End Stage Renal Disease, ?Gout, Neuropathy Gout, Neuropathy ?Date Acquired: 03/13/2021 03/13/2021 N/A ?Weeks of Treatment: 9 9 N/A ?Wound Status: Open Open N/A ?Wound Recurrence: No No N/A ?Clustered Wound: Yes No N/A ?Clustered Quantity: 2 N/A N/A ?Pending Amputation on Yes Yes N/A ?Presentation: ?Measurements L x W x D (cm) 0.5x2x0.1 0.5x1x0.2 N/A ?Area (cm?) : 0.785 0.393 N/A ?Volume (cm?) : 0.079 0.079 N/A ?%  Reduction in Area: 78.30% 92.60% N/A ?% Reduction in Volume: 78.10% 97.00% N/A ?Classification: Grade 2 Full Thickness Without Exposed N/A ?Support Structures ?Exudate Amount: Medium Medium N/A ?Exudate T

## 2021-07-11 NOTE — Progress Notes (Signed)
Duca, Xiara ([id number]) ?Visit Report for 07/09/2021 ?Chief Complaint Document Details ?Patient Name: [name], [name] ?Date of Service: 07/09/2021 1:15 PM ?Medical Record Number: [id number] ?Patient Account Number: [id number] ?Date of Birth/Sex: 19-Aug-1965 (56 y.o. 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 ?Electronic Signature(s) ?Signed: 07/09/2021 2:50:10 PM By: Kalman Shan DO ?Entered By: Kalman Shan on 07/09/2021 14:42:43 ?Genest, Adrie ([id number]) ?-------------------------------------------------------------------------------- ?Debridement Details ?Patient Name: [name] ?Date of Service: 07/09/2021 1:15 PM ?Medical Record Number: [id number] ?Patient Account Number: [id number] ?Date of Birth/Sex: 1965/05/05 (56 y.o. F) ?Treating RN: Carlene Coria ?Primary Care Provider: Laurance Flatten Other Clinician: ?Referring Provider: Laurance Flatten ?Treating Provider/Extender: Kalman Shan ?Weeks in Treatment: 9 ?Debridement Performed for ?Wound #9 Right,Midline Foot ?Assessment: ?Performed By: Physician Kalman Shan, MD ?Debridement Type: Debridement ?Level of Consciousness (Pre- ?Awake and Alert ?procedure): ?Pre-procedure Verification/Time Out ?Yes - 13:50 ?Taken: ?Start Time: 13:50 ?Total Area Debrided (L x W): 0.5 (cm) x 1 (cm) = 0.5 (cm?) ?Tissue and other material ?Viable, Non-Viable, Subcutaneous, Skin: Dermis ?debrided: ?Level: Skin/Subcutaneous Tissue ?Debridement Description: Excisional ?Instrument: Curette ?Bleeding: Minimum ?Hemostasis Achieved: Pressure ?End Time: 13:54 ?Procedural Pain: 0 ?Post Procedural Pain: 0 ?Response to Treatment: Procedure was tolerated well ?Level of Consciousness (Post- ?Awake and  Alert ?procedure): ?Post Debridement Measurements of Total Wound ?Length: (cm) 0.5 ?Width: (cm) 1 ?Depth: (cm) 0.2 ?Volume: (cm?) 0.079 ?Character of Wound/Ulcer Post Debridement: Improved ?Post Procedure Diagnosis ?Same as Pre-procedure ?Electronic Signature(s) ?Signed: 07/09/2021 2:50:10 PM By: Kalman Shan DO ?Signed: 07/11/2021 4:04:50 PM By: Carlene Coria RN ?Entered By: Carlene Coria on 07/09/2021 13:52:55 ?Taranto, Kairah ([id number]) ?-------------------------------------------------------------------------------- ?HPI Details ?Patient Name: [name] ?Date of Service: 07/09/2021 1:15 PM ?Medical Record Number: [id number] ?Patient Account Number: [id number] ?Date of Birth/Sex: 1965/07/18 (56 y.o. F) ?Treating RN: Carlene Coria ?Primary Care Provider: Laurance Flatten Other Clinician: ?Referring Provider: Laurance Flatten ?Treating Provider/Extender: Kalman Shan ?Weeks in Treatment: 9 ?History of Present Illness ?HPI Description: 10/24/2018 on evaluation today patient presents for initial evaluation or clinic concerning issues that she has been having with ?lymphedema for quite some time. Unfortunately she has several wound openings at this point that secondary to her lymphedema/venous stasis ?are giving her trouble and leaking quite severely. She also has diabetes along with hypertension and stage III chronic kidney disease. Fortunately ?there is no signs of active infection at this time. She is going to likely require debridement of the left leg ulcer upon evaluation today just based on ?what I am seeing. Fortunately there is no wound opening on the right. Overall the patient seems to be doing quite well and again there is no ?evidence of systemic infection which is good news. No fevers, chills, nausea, vomiting, or diarrhea. ?10/31/2018 on evaluation today patient actually appears to be doing excellent in regard to her lower extremity ulcers. She has been tolerating the ?dressing changes without complication.  Fortunately there is no signs of active infection at this time. She has tolerated 3 layer compression wrap ?without complication. ?11/07/2018 upon evaluation today patient actually appears to be doing very well with regard to her left lower extremity ulcers. She has been ?tolerating the dressing changes without complication. Fortunately there is no signs of active infection. No fevers, chills, nausea, vomiting, or ?diarrhea. ?11/21/2018 upon evaluation today patient appears to be doing quite well with regard to her lower extremity  ulcers. In fact both areas seem to be ?showing signs of good improvement which is excellent. She is not having as much pain as she has in the past and again has a lot of healing ?compared to previous visits as well. ?12/05/2018 on evaluation today patient presents for follow-up concerning her ongoing issues with her bilateral lower extremity ulcers. The good ?news is her right lower extremity is showing signs of completely healing at this time which is great news. Fortunately there is no evidence of ?active infection. On the left she has just a very small area still remaining that is open at this time all in all she is very close to complete closure in ?my opinion. She does have compression stockings to wear at home. ?12/12/2018 on evaluation today patient appears to be doing excellent in regard to her left lateral lower extremity ulcer. She has been tolerating ?the dressing changes without complication. Fortunately there is no signs of active infection at this time. In fact this appears to be pretty much ?healed at this point although again she is not 100% today. No fevers, chills, nausea, vomiting, or diarrhea. ?12/19/2018 on evaluation today patient actually appears to be doing excellent with regard to her lower extremity ulcer in fact this appears to be ?completely healed today which is all some. She has done extremely well with wound care measures. No fevers, chills, nausea, vomiting,  or ?diarrhea. ?------------------------------------ ?11/01/19-Readmission to the clinic ?Patient presents with left leg pain, wounds posterior calf, onset about 4 weeks, denies any fevers chills or shakes, has not been using anything to ?these wounds, was given compression stockings at last discharge from the clinic but has not been able to use them as they rolled down and cause ?creases ?Patient's history significant for type 2 diabetes insulin requiring, A1c of 6.9 lately, hypertension, chronic pain ?8/18; patient readmitted that the clinic last week. She has wounds on her left lateral posterior lower leg all of this in close juxtaposition i.e. a ?localized site. We've been using silver alginate under 3 layer compression apparently the wound surface area is better. She was wearing ?compression stockings from elastic therapy but says they were falling down. As she progresses more towards healing will need to address what ?we use in terms of compression stockings perhaps external compression garments ?11/17/2019 on evaluation today patient appears to be doing well with regard to her legs currently. She does have 2 wounds which are actually ?measuring much better the more posterior is actually doing very well which I am pleased with the other though smaller is not quite a small but ?nonetheless on the lateral aspect does seem to be improving. ?11/23/2019 on evaluation today patient appears to be doing well in regard to her wounds. In fact the wound on the posterior aspect of her leg ?appears healed the lateral aspect is still open but extremely small. ?9/15; this is a patient with chronic venous insufficiency and secondary lymphedema. She did not keep her clinic appointment last week and hence ?the left leg is a lot more swollen since she had to take the wrap off at some point. She has a small weeping area on the left lateral lower leg. She ?has probably a juxta lite stocking for the left leg I have asked her to bring  that in when she comes to her clinic appointment next week at which ?time she should be healed ?10/6; this is a patient with chronic venous insufficiency and secondary lymphedema left greater than right. Skin changes  of chronic ly

## 2021-07-16 ENCOUNTER — Encounter (HOSPITAL_BASED_OUTPATIENT_CLINIC_OR_DEPARTMENT_OTHER): Payer: Medicare Other | Admitting: Internal Medicine

## 2021-07-16 DIAGNOSIS — E11621 Type 2 diabetes mellitus with foot ulcer: Secondary | ICD-10-CM

## 2021-07-16 DIAGNOSIS — L97512 Non-pressure chronic ulcer of other part of right foot with fat layer exposed: Secondary | ICD-10-CM

## 2021-07-16 NOTE — Progress Notes (Signed)
Palmer, Juletta ([id number]) ?Visit Report for 07/16/2021 ?Arrival Information Details ?Patient Name: [name], [name] ?Date of Service: 07/16/2021 2:15 PM ?Medical Record Number: [id number] ?Patient Account Number: [id number] ?Date of Birth/Sex: 1965-07-04 (55 y.o. F) ?Treating RN: Levora Dredge ?Primary Care Dameshia Seybold: Laurance Flatten Other Clinician: ?Referring Sudeep Scheibel: Laurance Flatten ?Treating Sheneka Schrom/Extender: Kalman Shan ?Weeks in Treatment: 10 ?Visit Information History Since Last Visit ?Added or deleted any medications: No ?Patient Arrived: [name] ?Any new allergies or adverse reactions: No ?Arrival Time: 14:15 ?Had a fall or experienced change in No ?Accompanied By: self ?activities of daily living that may affect ?Transfer Assistance: None ?risk of falls: ?Patient Identification Verified: Yes ?Hospitalized since last visit: No ?Secondary Verification Process Completed: Yes ?Has Dressing in Place as Prescribed: Yes ?Patient Requires Transmission-Based Precautions: No ?Pain Present Now: Yes ?Patient Has Alerts: Yes ?Patient Alerts: diabetic ?Electronic Signature(s) ?Signed: 07/16/2021 4:30:13 PM By: Levora Dredge ?Entered By: Levora Dredge on 07/16/2021 14:19:57 ?Altmann, Jaleya ([id number]) ?-------------------------------------------------------------------------------- ?Clinic Level of Care Assessment Details ?Patient Name: [name] ?Date of Service: 07/16/2021 2:15 PM ?Medical Record Number: [id number] ?Patient Account Number: [id number] ?Date of Birth/Sex: 04-23-1965 (55 y.o. F) ?Treating RN: Levora Dredge ?Primary Care Hester Joslin: Laurance Flatten Other Clinician: ?Referring Tamey Wanek: Laurance Flatten ?Treating Otoniel Myhand/Extender: Kalman Shan ?Weeks in Treatment: 10 ?Clinic Level of Care Assessment Items ?TOOL 1 Quantity Score ?'[]'$  - Use when EandM and Procedure is performed on INITIAL visit 0 ?ASSESSMENTS - Nursing Assessment / Reassessment ?'[]'$  - General Physical Exam (combine w/ comprehensive assessment (listed  just below) when performed on new ?0 ?pt. evals) ?'[]'$  - 0 ?Comprehensive Assessment (HX, ROS, Risk Assessments, Wounds Hx, etc.) ?ASSESSMENTS - Wound and Skin Assessment / Reassessment ?'[]'$  - Dermatologic / Skin Assessment (not related to wound area) 0 ?ASSESSMENTS - Ostomy and/or Continence Assessment and Care ?'[]'$  - Incontinence Assessment and Management 0 ?'[]'$  - 0 ?Ostomy Care Assessment and Management (repouching, etc.) ?PROCESS - Coordination of Care ?'[]'$  - Simple Patient / Family Education for ongoing care 0 ?'[]'$  - 0 ?Complex (extensive) Patient / Family Education for ongoing care ?'[]'$  - 0 ?Staff obtains Consents, Records, Test Results / Process Orders ?'[]'$  - 0 ?Staff telephones HHA, Nursing Homes / Clarify orders / etc ?'[]'$  - 0 ?Routine Transfer to another Facility (non-emergent condition) ?'[]'$  - 0 ?Routine Hospital Admission (non-emergent condition) ?'[]'$  - 0 ?New Admissions / Biomedical engineer / Ordering NPWT, Apligraf, etc. ?'[]'$  - 0 ?Emergency Hospital Admission (emergent condition) ?PROCESS - Special Needs ?'[]'$  - Pediatric / Minor Patient Management 0 ?'[]'$  - 0 ?Isolation Patient Management ?'[]'$  - 0 ?Hearing / Language / Visual special needs ?'[]'$  - 0 ?Assessment of Community assistance (transportation, D/C planning, etc.) ?'[]'$  - 0 ?Additional assistance / Altered mentation ?'[]'$  - 0 ?Support Surface(s) Assessment (bed, cushion, seat, etc.) ?INTERVENTIONS - Miscellaneous ?'[]'$  - External ear exam 0 ?'[]'$  - 0 ?Patient Transfer (multiple staff / Civil Service fast streamer / Similar devices) ?'[]'$  - 0 ?Simple Staple / Suture removal (25 or less) ?'[]'$  - 0 ?Complex Staple / Suture removal (26 or more) ?'[]'$  - 0 ?Hypo/Hyperglycemic Management (do not check if billed separately) ?'[]'$  - 0 ?Ankle / Brachial Index (ABI) - do not check if billed separately ?Has the patient been seen at the hospital within the last three years: Yes ?Total Score: 0 ?Level Of Care: ____ ?Bollard, Joyia ([id number]) ?Electronic Signature(s) ?Signed: 07/16/2021 4:30:13 PM By:  Levora Dredge ?Entered By: Levora Dredge on 07/16/2021 16:05:46 ?Creasey, Georgiana ([id number]) ?-------------------------------------------------------------------------------- ?Encounter Discharge Information Details ?Patient Name: [name] ?Date of Service: 07/16/2021  2:15 PM ?Medical Record Number: [id number] ?Patient Account Number: [id number] ?Date of Birth/Sex: 1965-06-07 (55 y.o. F) ?Treating RN: Levora Dredge ?Primary Care Guilianna Mckoy: Laurance Flatten Other Clinician: ?Referring Tannie Koskela: Laurance Flatten ?Treating Graves Nipp/Extender: Kalman Shan ?Weeks in Treatment: 10 ?Encounter Discharge Information Items Post Procedure Vitals ?Discharge Condition: Stable ?Temperature (?F): 97.8 ?Ambulatory Status: Ambulatory ?Pulse (bpm): 76 ?Discharge Destination: Home ?Respiratory Rate (breaths/min): 18 ?Transportation: Private Auto ?Blood Pressure (mmHg): 146/77 ?Accompanied By: self ?Schedule Follow-up Appointment: Yes ?Clinical Summary of Care: Patient Declined ?Electronic Signature(s) ?Signed: 07/16/2021 4:13:03 PM By: Levora Dredge ?Entered By: Levora Dredge on 07/16/2021 16:13:03 ?Frey, Ayianna ([id number]) ?-------------------------------------------------------------------------------- ?Lower Extremity Assessment Details ?Patient Name: [name] ?Date of Service: 07/16/2021 2:15 PM ?Medical Record Number: [id number] ?Patient Account Number: [id number] ?Date of Birth/Sex: Aug 11, 1965 (55 y.o. F) ?Treating RN: Levora Dredge ?Primary Care Tifanny Dollens: Laurance Flatten Other Clinician: ?Referring Fortunata Betty: Laurance Flatten ?Treating Shanvi Moyd/Extender: Kalman Shan ?Weeks in Treatment: 10 ?Edema Assessment ?Assessed: [Left: No] [Right: No] ?Edema: [Left: Ye] [Right: s] ?Calf ?Left: Right: ?Point of Measurement: 35 cm From Medial Instep 46 cm ?Ankle ?Left: Right: ?Point of Measurement: 10 cm From Medial Instep 25.5 cm ?Vascular Assessment ?Pulses: ?Dorsalis Pedis ?Palpable: [Right:Yes] ?Notes ?pedal pulse weak, difficult  to assess ?Electronic Signature(s) ?Signed: 07/16/2021 4:30:13 PM By: Levora Dredge ?Entered By: Levora Dredge on 07/16/2021 14:34:57 ?Boursiquot, Fallan ([id number]) ?-------------------------------------------------------------------------------- ?Multi Wound Chart Details ?Patient Name: CHERYLN, BALCOM ?Date of Service: 07/16/2021 2:15 PM ?Medical Record Number: [id number] ?Patient Account Number: [id number] ?Date of Birth/Sex: Aug 16, 1965 (55 y.o. F) ?Treating RN: Levora Dredge ?Primary Care Maimuna Leaman: Laurance Flatten Other Clinician: ?Referring Monda Chastain: Laurance Flatten ?Treating Ashana Tullo/Extender: Kalman Shan ?Weeks in Treatment: 10 ?Vital Signs ?Height(in): ?Pulse(bpm): 76 ?Weight(lbs): ?Blood Pressure(mmHg): 146/77 ?Body Mass Index(BMI): ?Temperature(??F): 97.8 ?Respiratory Rate(breaths/min): 18 ?Photos: [N/A:N/A] ?Wound Location: Right, Plantar Foot Right, Midline Foot N/A ?Wounding Event: Surgical Injury Surgical Injury N/A ?Primary Etiology: Diabetic Wound/Ulcer of the Lower Open Surgical Wound N/A ?Extremity ?Comorbid History: Sleep Apnea, Congestive Heart Sleep Apnea, Congestive Heart N/A ?Failure, Coronary Artery Disease, Failure, Coronary Artery Disease, ?Hypertension, Myocardial Infarction, Hypertension, Myocardial Infarction, ?Peripheral Venous Disease, Type II Peripheral Venous Disease, Type II ?Diabetes, End Stage Renal Disease, Diabetes, End Stage Renal Disease, ?Gout, Neuropathy Gout, Neuropathy ?Date Acquired: 03/13/2021 03/13/2021 N/A ?Weeks of Treatment: 10 10 N/A ?Wound Status: Healed - Epithelialized Open N/A ?Wound Recurrence: No No N/A ?Clustered Wound: Yes No N/A ?Clustered Quantity: 2 N/A N/A ?Pending Amputation on Yes Yes N/A ?Presentation: ?Measurements L x W x D (cm) 0x0x0 0.4x1x0.2 N/A ?Area (cm?) : 0 0.314 N/A ?Volume (cm?) : 0 0.063 N/A ?% Reduction in Area: 100.00% 94.10% N/A ?% Reduction in Volume: 100.00% 97.60% N/A ?Classification: Grade 2 Full Thickness Without Exposed N/A ?Support  Structures ?Exudate Amount: None Present Medium N/A ?Exudate Type: N/A Serosanguineous N/A ?Exudate Color: N/A red, brown N/A ?Granulation Amount: None Present (0%) Large (67-100%) N/A ?Granulation Qualit

## 2021-07-16 NOTE — Progress Notes (Signed)
[name] ([id number]) ?Visit Report for 07/16/2021 ?Chief Complaint Document Details ?Patient Name: [name], [name] ?Date of Service: 07/16/2021 2:15 PM ?Medical Record Number: [id number] ?Patient Account Number: [id number] ?Date of Birth/Sex: [birth year] (56 y.o. 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 ?Electronic Signature(s) ?Signed: 07/16/2021 5:26:07 PM By: Kalman Shan DO ?Entered By: Kalman Shan on 07/16/2021 15:12:59 ?Brosch, Katelyn ([id number]) ?-------------------------------------------------------------------------------- ?Debridement Details ?Patient Name: [name] ?Date of Service: 07/16/2021 2:15 PM ?Medical Record Number: [id number] ?Patient Account Number: [id number] ?Date of Birth/Sex: 1965/04/09 (56 y.o. F) ?Treating RN: Levora Dredge ?Primary Care Provider: Laurance Flatten Other Clinician: ?Referring Provider: Laurance Flatten ?Treating Provider/Extender: Kalman Shan ?Weeks in Treatment: 10 ?Debridement Performed for ?Wound #9 Right,Midline Foot ?Assessment: ?Performed By: Physician Kalman Shan, MD ?Debridement Type: Debridement ?Level of Consciousness (Pre- ?Awake and Alert ?procedure): ?Pre-procedure Verification/Time Out ?Yes - 15:01 ?Taken: ?Total Area Debrided (L x W): 0.4 (cm) x 1 (cm) = 0.4 (cm?) ?Tissue and other material ?Viable, Non-Viable, [place], Subcutaneous, [place] ?debrided: ?Level: Skin/Subcutaneous Tissue ?Debridement Description: Excisional ?Instrument: Curette ?Bleeding: Minimum ?Hemostasis Achieved: Pressure ?Response to Treatment: Procedure was tolerated well ?Level of Consciousness (Post- ?Awake and Alert ?procedure): ?Post Debridement Measurements of Total Wound ?Length: (cm) 0.4 ?Width:  (cm) 1 ?Depth: (cm) 0.2 ?Volume: (cm?) 0.063 ?Character of Wound/Ulcer Post Debridement: Stable ?Post Procedure Diagnosis ?Same as Pre-procedure ?Electronic Signature(s) ?Signed: 07/16/2021 4:30:13 PM By: Levora Dredge ?Signed: 07/16/2021 5:26:07 PM By: Kalman Shan DO ?Entered By: Levora Dredge on 07/16/2021 15:02:39 ?Dimaano, Ruqayyah ([id number]) ?-------------------------------------------------------------------------------- ?HPI Details ?Patient Name: [name] ?Date of Service: 07/16/2021 2:15 PM ?Medical Record Number: [id number] ?Patient Account Number: [id number] ?Date of Birth/Sex: 1965-07-11 (56 y.o. F) ?Treating RN: Levora Dredge ?Primary Care Provider: Laurance Flatten Other Clinician: ?Referring Provider: Laurance Flatten ?Treating Provider/Extender: Kalman Shan ?Weeks in Treatment: 10 ?History of Present Illness ?HPI Description: 10/24/2018 on evaluation today patient presents for initial evaluation or clinic concerning issues that she has been having with ?lymphedema for quite some time. Unfortunately she has several wound openings at this point that secondary to her lymphedema/venous stasis ?are giving her trouble and leaking quite severely. She also has diabetes along with hypertension and stage III chronic kidney disease. Fortunately ?there is no signs of active infection at this time. She is going to likely require debridement of the left leg ulcer upon evaluation today just based on ?what I am seeing. Fortunately there is no wound opening on the right. Overall the patient seems to be doing quite well and again there is no ?evidence of systemic infection which is good news. No fevers, chills, nausea, vomiting, or diarrhea. ?10/31/2018 on evaluation today patient actually appears to be doing excellent in regard to her lower extremity ulcers. She has been tolerating the ?dressing changes without complication. Fortunately there is no signs of active infection at this time. She has tolerated 3 layer  compression wrap ?without complication. ?11/07/2018 upon evaluation today patient actually appears to be doing very well with regard to her left lower extremity ulcers. She has been ?tolerating the dressing changes without complication. Fortunately there is no signs of active infection. No fevers, chills, nausea, vomiting, or ?diarrhea. ?11/21/2018 upon evaluation today patient appears to be doing quite well with regard to her lower extremity ulcers. In fact both areas seem to be ?showing signs of good improvement which  is excellent. She is not having as much pain as she has in the past and again has a lot of healing ?compared to previous visits as well. ?12/05/2018 on evaluation today patient presents for follow-up concerning her ongoing issues with her bilateral lower extremity ulcers. The good ?news is her right lower extremity is showing signs of completely healing at this time which is great news. Fortunately there is no evidence of ?active infection. On the left she has just a very small area still remaining that is open at this time all in all she is very close to complete closure in ?my opinion. She does have compression stockings to wear at home. ?12/12/2018 on evaluation today patient appears to be doing excellent in regard to her left lateral lower extremity ulcer. She has been tolerating ?the dressing changes without complication. Fortunately there is no signs of active infection at this time. In fact this appears to be pretty much ?healed at this point although again she is not 100% today. No fevers, chills, nausea, vomiting, or diarrhea. ?12/19/2018 on evaluation today patient actually appears to be doing excellent with regard to her lower extremity ulcer in fact this appears to be ?completely healed today which is all some. She has done extremely well with wound care measures. No fevers, chills, nausea, vomiting, or ?diarrhea. ?------------------------------------ ?11/01/19-Readmission to the  clinic ?Patient presents with left leg pain, wounds posterior calf, onset about 4 weeks, denies any fevers chills or shakes, has not been using anything to ?these wounds, was given compression stockings at last discharge from the clinic but has not been able to use them as they rolled down and cause ?creases ?Patient's history significant for type 2 diabetes insulin requiring, A1c of 6.9 lately, hypertension, chronic pain ?8/18; patient readmitted that the clinic last week. She has wounds on her left lateral posterior lower leg all of this in close juxtaposition i.e. a ?localized site. We've been using silver alginate under 3 layer compression apparently the wound surface area is better. She was wearing ?compression stockings from elastic therapy but says they were falling down. As she progresses more towards healing will need to address what ?we use in terms of compression stockings perhaps external compression garments ?11/17/2019 on evaluation today patient appears to be doing well with regard to her legs currently. She does have 2 wounds which are actually ?measuring much better the more posterior is actually doing very well which I am pleased with the other though smaller is not quite a small but ?nonetheless on the lateral aspect does seem to be improving. ?11/23/2019 on evaluation today patient appears to be doing well in regard to her wounds. In fact the wound on the posterior aspect of her leg ?appears healed the lateral aspect is still open but extremely small. ?9/15; this is a patient with chronic venous insufficiency and secondary lymphedema. She did not keep her clinic appointment last week and hence ?the left leg is a lot more swollen since she had to take the wrap off at some point. She has a small weeping area on the left lateral lower leg. She ?has probably a juxta lite stocking for the left leg I have asked her to bring that in when she comes to her clinic appointment next week at which ?time she  should be healed ?10/6; this is a patient with chronic venous insufficiency and secondary lymphedema left greater than right. Skin changes of chronic lymphedema ?especially in the left leg. She has not been here in  3 w

## 2021-07-23 ENCOUNTER — Encounter: Payer: Medicare Other | Attending: Internal Medicine | Admitting: Internal Medicine

## 2021-07-23 DIAGNOSIS — M869 Osteomyelitis, unspecified: Secondary | ICD-10-CM | POA: Diagnosis not present

## 2021-07-23 DIAGNOSIS — L97811 Non-pressure chronic ulcer of other part of right lower leg limited to breakdown of skin: Secondary | ICD-10-CM | POA: Diagnosis not present

## 2021-07-23 DIAGNOSIS — X58XXXA Exposure to other specified factors, initial encounter: Secondary | ICD-10-CM | POA: Insufficient documentation

## 2021-07-23 DIAGNOSIS — L97512 Non-pressure chronic ulcer of other part of right foot with fat layer exposed: Secondary | ICD-10-CM | POA: Diagnosis not present

## 2021-07-23 DIAGNOSIS — E11621 Type 2 diabetes mellitus with foot ulcer: Secondary | ICD-10-CM | POA: Insufficient documentation

## 2021-07-23 DIAGNOSIS — T8130XA Disruption of wound, unspecified, initial encounter: Secondary | ICD-10-CM | POA: Insufficient documentation

## 2021-07-23 NOTE — Progress Notes (Signed)
[name] ([id number]) ?Visit Report for 07/23/2021 ?Arrival Information Details ?Patient Name: [name], [name] ?Date of Service: 07/23/2021 2:45 PM ?Medical Record Number: [id number] ?Patient Account Number: [id number] ?Date of Birth/Sex: 1965-06-19 (56 y.o. F) ?Treating RN: Levora Dredge ?Primary Care Aracelie Addis: Laurance Flatten Other Clinician: ?Referring Brewer Hitchman: Laurance Flatten ?Treating Kade Demicco/Extender: Kalman Shan ?Weeks in Treatment: 11 ?Visit Information History Since Last Visit ?Added or deleted any medications: No ?Patient Arrived: [name] ?Any new allergies or adverse reactions: No ?Arrival Time: 14:57 ?Had a fall or experienced change in No ?Accompanied By: self ?activities of daily living that may affect ?Transfer Assistance: None ?risk of falls: ?Patient Identification Verified: Yes ?Hospitalized since last visit: No ?Secondary Verification Process Completed: Yes ?Has Dressing in Place as Prescribed: Yes ?Patient Requires Transmission-Based Precautions: No ?Pain Present Now: No ?Patient Has Alerts: Yes ?Patient Alerts: diabetic ?Electronic Signature(s) ?Signed: 07/23/2021 4:16:06 PM By: Levora Dredge ?Entered By: Levora Dredge on 07/23/2021 14:57:34 ?[name], [name] ([id number]) ?-------------------------------------------------------------------------------- ?Clinic Level of Care Assessment Details ?Patient Name: [name] ?Date of Service: 07/23/2021 2:45 PM ?Medical Record Number: [id number] ?Patient Account Number: [id number] ?Date of Birth/Sex: November 02, 1965 (56 y.o. F) ?Treating RN: Levora Dredge ?Primary Care Sherron Mapp: Laurance Flatten Other Clinician: ?Referring Brittiny Levitz: Laurance Flatten ?Treating Jawara Latorre/Extender: Kalman Shan ?Weeks in Treatment: 11 ?Clinic Level of Care Assessment Items ?TOOL 1 Quantity Score ?'[]'$  - Use when EandM and Procedure is performed on INITIAL visit 0 ?ASSESSMENTS - Nursing Assessment / Reassessment ?'[]'$  - General Physical Exam (combine w/ comprehensive assessment (listed just  below) when performed on new ?0 ?pt. evals) ?'[]'$  - 0 ?Comprehensive Assessment (HX, ROS, Risk Assessments, Wounds Hx, etc.) ?ASSESSMENTS - Wound and Skin Assessment / Reassessment ?'[]'$  - Dermatologic / Skin Assessment (not related to wound area) 0 ?ASSESSMENTS - Ostomy and/or Continence Assessment and Care ?'[]'$  - Incontinence Assessment and Management 0 ?'[]'$  - 0 ?Ostomy Care Assessment and Management (repouching, etc.) ?PROCESS - Coordination of Care ?'[]'$  - Simple Patient / Family Education for ongoing care 0 ?'[]'$  - 0 ?Complex (extensive) Patient / Family Education for ongoing care ?'[]'$  - 0 ?Staff obtains Consents, Records, Test Results / Process Orders ?'[]'$  - 0 ?Staff telephones HHA, Nursing Homes / Clarify orders / etc ?'[]'$  - 0 ?Routine Transfer to another Facility (non-emergent condition) ?'[]'$  - 0 ?Routine Hospital Admission (non-emergent condition) ?'[]'$  - 0 ?New Admissions / Biomedical engineer / Ordering NPWT, Apligraf, etc. ?'[]'$  - 0 ?Emergency Hospital Admission (emergent condition) ?PROCESS - Special Needs ?'[]'$  - Pediatric / Minor Patient Management 0 ?'[]'$  - 0 ?Isolation Patient Management ?'[]'$  - 0 ?Hearing / Language / Visual special needs ?'[]'$  - 0 ?Assessment of Community assistance (transportation, D/C planning, etc.) ?'[]'$  - 0 ?Additional assistance / Altered mentation ?'[]'$  - 0 ?Support Surface(s) Assessment (bed, cushion, seat, etc.) ?INTERVENTIONS - Miscellaneous ?'[]'$  - External ear exam 0 ?'[]'$  - 0 ?Patient Transfer (multiple staff / Civil Service fast streamer / Similar devices) ?'[]'$  - 0 ?Simple Staple / Suture removal (25 or less) ?'[]'$  - 0 ?Complex Staple / Suture removal (26 or more) ?'[]'$  - 0 ?Hypo/Hyperglycemic Management (do not check if billed separately) ?'[]'$  - 0 ?Ankle / Brachial Index (ABI) - do not check if billed separately ?Has the patient been seen at the hospital within the last three years: Yes ?Total Score: 0 ?Level Of Care: ____ ?[name], [name] ([id number]) ?Electronic Signature(s) ?Signed: 07/23/2021 4:16:06 PM By: Levora Dredge ?Entered By: Levora Dredge on 07/23/2021 15:49:39 ?[name], [name] ([id number]) ?-------------------------------------------------------------------------------- ?Encounter Discharge Information Details ?Patient Name: [name], [name] ?Date of Service: 07/23/2021  2:45 PM ?Medical Record Number: [id number] ?Patient Account Number: [id number] ?Date of Birth/Sex: 1965/09/01 (56 y.o. F) ?Treating RN: Levora Dredge ?Primary Care Jaidan Prevette: Laurance Flatten Other Clinician: ?Referring Kare Dado: Laurance Flatten ?Treating Diezel Mazur/Extender: Kalman Shan ?Weeks in Treatment: 11 ?Encounter Discharge Information Items Post Procedure Vitals ?Discharge Condition: Stable ?Temperature (?F): 97.4 ?Ambulatory Status: [name] ?Pulse (bpm): 84 ?Discharge Destination: Home ?Respiratory Rate (breaths/min): 18 ?Transportation: Private Auto ?Blood Pressure (mmHg): 137/80 ?Accompanied By: daughter ?Schedule Follow-up Appointment: Yes ?Clinical Summary of Care: Patient Declined ?Electronic Signature(s) ?Signed: 07/23/2021 4:10:02 PM By: Levora Dredge ?Entered By: Levora Dredge on 07/23/2021 16:10:01 ?[name], [name] ([id number]) ?-------------------------------------------------------------------------------- ?Lower Extremity Assessment Details ?Patient Name: [name], [name] ?Date of Service: 07/23/2021 2:45 PM ?Medical Record Number: [id number] ?Patient Account Number: [id number] ?Date of Birth/Sex: 1965/07/16 (56 y.o. F) ?Treating RN: Levora Dredge ?Primary Care Jumaane Weatherford: Laurance Flatten Other Clinician: ?Referring Daveda Larock: Laurance Flatten ?Treating Robet Crutchfield/Extender: Kalman Shan ?Weeks in Treatment: 11 ?Edema Assessment ?Assessed: [Left: No] [Right: No] ?Edema: [Left: Ye] [Right: s] ?Calf ?Left: Right: ?Point of Measurement: 35 cm From Medial Instep 46.5 cm ?Ankle ?Left: Right: ?Point of Measurement: 10 cm From Medial Instep 25.5 cm ?Vascular Assessment ?Pulses: ?Dorsalis Pedis ?Palpable: [Right:Yes] ?Electronic Signature(s) ?Signed: 07/23/2021  4:16:06 PM By: Levora Dredge ?Entered By: Levora Dredge on 07/23/2021 15:09:18 ?[name], [name] ([id number]) ?-------------------------------------------------------------------------------- ?Multi Wound Chart Details ?Patient Name: [name], [name] ?Date of Service: 07/23/2021 2:45 PM ?Medical Record Number: [id number] ?Patient Account Number: [id number] ?Date of Birth/Sex: 17-Mar-1966 (56 y.o. F) ?Treating RN: Levora Dredge ?Primary Care Aubreyanna Dorrough: Laurance Flatten Other Clinician: ?Referring Obelia Bonello: Laurance Flatten ?Treating Mariann Palo/Extender: Kalman Shan ?Weeks in Treatment: 11 ?Vital Signs ?Height(in): ?Pulse(bpm): 84 ?Weight(lbs): ?Blood Pressure(mmHg): 137/80 ?Body Mass Index(BMI): ?Temperature(??F): 97.4 ?Respiratory Rate(breaths/min): 18 ?Photos: [N/A:N/A] ?Wound Location: Right, Medial Lower Leg Right, Midline Foot N/A ?Wounding Event: Blister Surgical Injury N/A ?Primary Etiology: Diabetic Wound/Ulcer of the Lower Open Surgical Wound N/A ?Extremity ?Comorbid History: Sleep Apnea, Congestive Heart Sleep Apnea, Congestive Heart N/A ?Failure, Coronary Artery Disease, Failure, Coronary Artery Disease, ?Hypertension, Myocardial Infarction, Hypertension, Myocardial Infarction, ?Peripheral Venous Disease, Type II Peripheral Venous Disease, Type II ?Diabetes, End Stage Renal Disease, Diabetes, End Stage Renal Disease, ?Gout, Neuropathy Gout, Neuropathy ?Date Acquired: 07/20/2021 03/13/2021 N/A ?Weeks of Treatment: 0 11 N/A ?Wound Status: Open Open N/A ?Wound Recurrence: No No N/A ?Pending Amputation on No Yes N/A ?Presentation: ?Measurements L x W x D (cm) 8.5x6x0.1 0.2x0.8x0.3 N/A ?Area (cm?) : 40.055 0.126 N/A ?Volume (cm?) : 4.006 0.038 N/A ?% Reduction in Area: N/A 97.60% N/A ?% Reduction in Volume: N/A 98.60% N/A ?Classification: Grade 1 Full Thickness Without Exposed N/A ?Support Structures ?Exudate Amount: Medium Medium N/A ?Exudate Type: Serous Serosanguineous N/A ?Exudate Color: amber red, brown  N/A ?Granulation Amount: None Present (0%) Large (67-100%) N/A ?Granulation Quality: N/A Pink, Pale N/A ?Necrotic Amount: Large (67-100%) Small (1-33%) N/A ?Epithelialization: None Small (1-33%) N/A ?Treatment Notes ?El

## 2021-07-23 NOTE — Progress Notes (Signed)
[name], [name] ([id number]) ?Visit Report for 07/23/2021 ?Chief Complaint Document Details ?Patient Name: [name], [name] ?Date of Service: 07/23/2021 2:45 PM ?Medical Record Number: [id number] ?Patient Account Number: [id number] ?Date of Birth/Sex: 1965/12/10 (55 y.o. 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 ?07/23/2021; patient developed a wound to the anterior right lower extremity limited to skin breakdown from a blister. ?Electronic Signature(s) ?Signed: 07/23/2021 4:19:06 PM By: [name] Shan DO ?Entered By: [name] on 07/23/2021 16:09:13 ?[name], [name] ([id number]) ?-------------------------------------------------------------------------------- ?Debridement Details ?Patient Name: [name], [name] ?Date of Service: 07/23/2021 2:45 PM ?Medical Record Number: [id number] ?Patient Account Number: [id number] ?Date of Birth/Sex: Feb 08, 1966 (55 y.o. F) ?Treating RN: [name] ?Primary Care Provider: Laurance [name] Other Clinician: ?Referring Provider: Laurance [name] ?Treating Provider/Extender: [name] ?Weeks in Treatment: 11 ?Debridement Performed for ?Wound #11 Right,Medial Lower Leg ?Assessment: ?Performed By: Physician [name] Shan, MD ?Debridement Type: Debridement ?Severity of Tissue Pre Debridement: Limited to breakdown of skin ?Level of Consciousness (Pre- ?Awake and Alert ?procedure): ?Pre-procedure Verification/Time Out ?Yes - 13:31 ?Taken: ?Total Area Debrided (L x W): 8.5 (cm) x 6 (cm) = 51 (cm?) ?Tissue and other material ?Non-Viable, Skin: Dermis , Skin: Epidermis ?debrided: ?Level: Skin/Epidermis ?Debridement Description: Selective/Open Wound ?Instrument: Forceps, Scissors ?Bleeding: None ?Response to Treatment:  Procedure was tolerated well ?Level of Consciousness (Post- ?Awake and Alert ?procedure): ?Post Debridement Measurements of Total Wound ?Length: (cm) 8.5 ?Width: (cm) 6 ?Depth: (cm) 0.1 ?Volume: (cm?) 4.006 ?Character of Wound/Ulcer Post Debridement: Stable ?Severity of Tissue Post Debridement: Limited to breakdown of skin ?Post Procedure Diagnosis ?Same as Pre-procedure ?Electronic Signature(s) ?Signed: 07/23/2021 4:16:06 PM By: [name] ?Signed: 07/23/2021 4:19:06 PM By: [name] Shan DO ?Entered By: [name] on 07/23/2021 15:32:19 ?[name], [name] ([id number]) ?-------------------------------------------------------------------------------- ?HPI Details ?Patient Name: [name], [name] ?Date of Service: 07/23/2021 2:45 PM ?Medical Record Number: [id number] ?Patient Account Number: [id number] ?Date of Birth/Sex: Feb 12, 1966 (55 y.o. F) ?Treating RN: [name] ?Primary Care Provider: Laurance [name] Other Clinician: ?Referring Provider: Laurance [name] ?Treating Provider/Extender: [name] ?Weeks in Treatment: 11 ?History of Present Illness ?HPI Description: 10/24/2018 on evaluation today patient presents for initial evaluation or clinic concerning issues that she has been having with ?lymphedema for quite some time. Unfortunately she has several wound openings at this point that secondary to her lymphedema/venous stasis ?are giving her trouble and leaking quite severely. She also has diabetes along with hypertension and stage III chronic kidney disease. Fortunately ?there is no signs of active infection at this time. She is going to likely require debridement of the left leg ulcer upon evaluation today just based on ?what I am seeing. Fortunately there is no wound opening on the right. Overall the patient seems to be doing quite well and again there is no ?evidence of systemic infection which is good news. No fevers, chills, nausea, vomiting, or diarrhea. ?10/31/2018 on evaluation today patient actually  appears to be doing excellent in regard to her lower extremity ulcers. She has been tolerating the ?dressing changes without complication. Fortunately there is no signs of active infection at this time. She has tolerated 3 layer compression wrap ?without complication. ?11/07/2018 upon evaluation today patient actually appears to be doing very well with regard to her left lower extremity ulcers. She has been ?tolerating the dressing changes without complication. Fortunately there is no signs of active infection.  No fevers, chills, nausea, vomiting, or ?diarrhea. ?11/21/2018 upon evaluation today patient appears to be doing quite well with regard to her lower extremity ulcers. In fact both areas seem to be ?showing signs of good improvement which is excellent. She is not having as much pain as she has in the past and again has a lot of healing ?compared to previous visits as well. ?12/05/2018 on evaluation today patient presents for follow-up concerning her ongoing issues with her bilateral lower extremity ulcers. The good ?news is her right lower extremity is showing signs of completely healing at this time which is great news. Fortunately there is no evidence of ?active infection. On the left she has just a very small area still remaining that is open at this time all in all she is very close to complete closure in ?my opinion. She does have compression stockings to wear at home. ?12/12/2018 on evaluation today patient appears to be doing excellent in regard to her left lateral lower extremity ulcer. She has been tolerating ?the dressing changes without complication. Fortunately there is no signs of active infection at this time. In fact this appears to be pretty much ?healed at this point although again she is not 100% today. No fevers, chills, nausea, vomiting, or diarrhea. ?12/19/2018 on evaluation today patient actually appears to be doing excellent with regard to her lower extremity ulcer in fact this appears to  be ?completely healed today which is all some. She has done extremely well with wound care measures. No fevers, chills, nausea, vomiting, or ?diarrhea. ?------------------------------------ ?11/01/19-Readmission to the clinic ?Patient presents with left leg pain, wounds posterior calf, onset about 4 weeks, denies any fevers chills or shakes, has not been using anything to ?these wounds, was given compression stockings at last discharge from the clinic but has not been able to use them as they rolled down and cause ?creases ?Patient's history significant for type 2 diabetes insulin requiring, A1c of 6.9 lately, hypertension, chronic pain ?8/18; patient readmitted that the clinic last week. She has wounds on her left lateral posterior lower leg all of this in close juxtaposition i.e. a ?localized site. We've been using silver alginate under 3 layer compression apparently the wound surface area is better. She was wearing ?compression stockings from elastic therapy but says they were falling down. As she progresses more towards healing will need to address what ?we use in terms of compression stockings perhaps external compression garments ?11/17/2019 on evaluation today patient appears to be doing well with regard to her legs currently. She does have 2 wounds which are actually ?measuring much better the more posterior is actually doing very well which I am pleased with the other though smaller is not quite a small but ?nonetheless on the lateral aspect does seem to be improving. ?11/23/2019 on evaluation today patient appears to be doing well in regard to her wounds. In fact the wound on the posterior aspect of her leg ?appears healed the lateral aspect is still open but extremely small. ?9/15; this is a patient with chronic venous insufficiency and secondary lymphedema. She did not keep her clinic appointment last week and hence ?the left leg is a lot more swollen since she had to take the wrap off at some point. She has  a small weeping area on the left lateral lower leg. She ?has probably a juxta lite stocking for the left leg I have asked her to bring that in when she comes to her clinic appointment next week at  which ?time she s

## 2021-07-30 ENCOUNTER — Encounter (HOSPITAL_BASED_OUTPATIENT_CLINIC_OR_DEPARTMENT_OTHER): Payer: Medicare Other | Admitting: Internal Medicine

## 2021-07-30 DIAGNOSIS — L97811 Non-pressure chronic ulcer of other part of right lower leg limited to breakdown of skin: Secondary | ICD-10-CM

## 2021-07-30 DIAGNOSIS — E11621 Type 2 diabetes mellitus with foot ulcer: Secondary | ICD-10-CM | POA: Diagnosis not present

## 2021-07-30 NOTE — Progress Notes (Signed)
Cedotal, Riyan ([id number]) ?Visit Report for 07/30/2021 ?Chief Complaint Document Details ?Patient Name: [name], [name] ?Date of Service: 07/30/2021 2:15 PM ?Medical Record Number: [id number] ?Patient Account Number: [id number] ?Date of Birth/Sex: 1965-09-24 (56 y.o. 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 ?07/23/2021; patient developed a wound to the anterior right lower extremity limited to skin breakdown from a blister. ?Electronic Signature(s) ?Signed: 07/30/2021 3:46:08 PM By: Kalman Shan DO ?Entered By: Kalman Shan on 07/30/2021 15:25:51 ?[name] ([id number]) ?-------------------------------------------------------------------------------- ?Debridement Details ?Patient Name: [name] ?Date of Service: 07/30/2021 2:15 PM ?Medical Record Number: [id number] ?Patient Account Number: [id number] ?Date of Birth/Sex: 1965-12-30 (56 y.o. F) ?Treating RN: Levora Dredge ?Primary Care Provider: Laurance Flatten Other Clinician: ?Referring Provider: Laurance Flatten ?Treating Provider/Extender: Kalman Shan ?Weeks in Treatment: 12 ?Debridement Performed for ?Wound #11 Right,Medial Lower Leg ?Assessment: ?Performed By: Physician Kalman Shan, MD ?Debridement Type: Debridement ?Severity of Tissue Pre Debridement: Fat layer exposed ?Level of Consciousness (Pre- ?Awake and Alert ?procedure): ?Pre-procedure Verification/Time Out ?Yes - 14:51 ?Taken: ?Pain Control: Lidocaine 4% Topical Solution ?Total Area Debrided (L x W): 5.5 (cm) x 9.7 (cm) = 53.35 (cm?) ?Tissue and other material ?Viable, Non-Viable, [place], Subcutaneous, [place] ?debrided: ?Level: Skin/Subcutaneous Tissue ?Debridement Description: Excisional ?Instrument:  Curette ?Bleeding: Minimum ?Hemostasis Achieved: Pressure ?Response to Treatment: Procedure was tolerated well ?Level of Consciousness (Post- ?Awake and Alert ?procedure): ?Post Debridement Measurements of Total Wound ?Length: (cm) 5.5 ?Width: (cm) 9.7 ?Depth: (cm) 0.1 ?Volume: (cm?) 4.19 ?Character of Wound/Ulcer Post Debridement: Stable ?Severity of Tissue Post Debridement: Fat layer exposed ?Post Procedure Diagnosis ?Same as Pre-procedure ?Electronic Signature(s) ?Signed: 07/30/2021 3:46:08 PM By: Kalman Shan DO ?Signed: 07/30/2021 4:11:25 PM By: Levora Dredge ?Entered By: Levora Dredge on 07/30/2021 14:53:42 ?[name] ([id number]) ?-------------------------------------------------------------------------------- ?HPI Details ?Patient Name: [name] ?Date of Service: 07/30/2021 2:15 PM ?Medical Record Number: [id number] ?Patient Account Number: [id number] ?Date of Birth/Sex: Aug 05, 1965 (56 y.o. F) ?Treating RN: Levora Dredge ?Primary Care Provider: Laurance Flatten Other Clinician: ?Referring Provider: Laurance Flatten ?Treating Provider/Extender: Kalman Shan ?Weeks in Treatment: 12 ?History of Present Illness ?HPI Description: 10/24/2018 on evaluation today patient presents for initial evaluation or clinic concerning issues that she has been having with ?lymphedema for quite some time. Unfortunately she has several wound openings at this point that secondary to her lymphedema/venous stasis ?are giving her trouble and leaking quite severely. She also has diabetes along with hypertension and stage III chronic kidney disease. Fortunately ?there is no signs of active infection at this time. She is going to likely require debridement of the left leg ulcer upon evaluation today just based on ?what I am seeing. Fortunately there is no wound opening on the right. Overall the patient seems to be doing quite well and again there is no ?evidence of systemic infection which is good news. No fevers, chills, nausea,  vomiting, or diarrhea. ?10/31/2018 on evaluation today patient actually appears to be doing excellent in regard to her lower extremity ulcers. She has been tolerating the ?dressing changes without complication. Fortunately there is no signs of active infection at this time. She has tolerated 3 layer compression wrap ?without complication. ?11/07/2018 upon evaluation today patient actually appears to be doing very well with regard to her left lower extremity ulcers. She has been ?tolerating the dressing changes without complication. Fortunately there is no signs  of active infection. No fevers, chills, nausea, vomiting, or ?diarrhea. ?11/21/2018 upon evaluation today patient appears to be doing quite well with regard to her lower extremity ulcers. In fact both areas seem to be ?showing signs of good improvement which is excellent. She is not having as much pain as she has in the past and again has a lot of healing ?compared to previous visits as well. ?12/05/2018 on evaluation today patient presents for follow-up concerning her ongoing issues with her bilateral lower extremity ulcers. The good ?news is her right lower extremity is showing signs of completely healing at this time which is great news. Fortunately there is no evidence of ?active infection. On the left she has just a very small area still remaining that is open at this time all in all she is very close to complete closure in ?my opinion. She does have compression stockings to wear at home. ?12/12/2018 on evaluation today patient appears to be doing excellent in regard to her left lateral lower extremity ulcer. She has been tolerating ?the dressing changes without complication. Fortunately there is no signs of active infection at this time. In fact this appears to be pretty much ?healed at this point although again she is not 100% today. No fevers, chills, nausea, vomiting, or diarrhea. ?12/19/2018 on evaluation today patient actually appears to be doing  excellent with regard to her lower extremity ulcer in fact this appears to be ?completely healed today which is all some. She has done extremely well with wound care measures. No fevers, chills, nausea, vomiting, or ?diarrhea. ?------------------------------------ ?11/01/19-Readmission to the clinic ?Patient presents with left leg pain, wounds posterior calf, onset about 4 weeks, denies any fevers chills or shakes, has not been using anything to ?these wounds, was given compression stockings at last discharge from the clinic but has not been able to use them as they rolled down and cause ?creases ?Patient's history significant for type 2 diabetes insulin requiring, A1c of 6.9 lately, hypertension, chronic pain ?8/18; patient readmitted that the clinic last week. She has wounds on her left lateral posterior lower leg all of this in close juxtaposition i.e. a ?localized site. We've been using silver alginate under 3 layer compression apparently the wound surface area is better. She was wearing ?compression stockings from elastic therapy but says they were falling down. As she progresses more towards healing will need to address what ?we use in terms of compression stockings perhaps external compression garments ?11/17/2019 on evaluation today patient appears to be doing well with regard to her legs currently. She does have 2 wounds which are actually ?measuring much better the more posterior is actually doing very well which I am pleased with the other though smaller is not quite a small but ?nonetheless on the lateral aspect does seem to be improving. ?11/23/2019 on evaluation today patient appears to be doing well in regard to her wounds. In fact the wound on the posterior aspect of her leg ?appears healed the lateral aspect is still open but extremely small. ?9/15; this is a patient with chronic venous insufficiency and secondary lymphedema. She did not keep her clinic appointment last week and hence ?the left leg is a  lot more swollen since she had to take the wrap off at some point. She has a small weeping area on the left lateral lower leg. She ?has probably a juxta lite stocking for the left leg I have asked her to bring that in when

## 2021-07-30 NOTE — Progress Notes (Signed)
[name] ([id number]) ?Visit Report for 07/30/2021 ?Arrival Information Details ?Patient Name: [name], [name] ?Date of Service: 07/30/2021 2:15 PM ?Medical Record Number: [id number] ?Patient Account Number: [id number] ?Date of Birth/Sex: 01/29/1966 (55 y.o. F) ?Treating RN: Levora Dredge ?Primary Care Blakeley Margraf: Laurance Flatten Other Clinician: ?Referring Ezra Denne: Laurance Flatten ?Treating Romi Rathel/Extender: Kalman Shan ?Weeks in Treatment: 12 ?Visit Information History Since Last Visit ?Added or deleted any medications: No ?Patient Arrived: [name] ?Any new allergies or adverse reactions: No ?Arrival Time: 14:23 ?Had a fall or experienced change in No ?Accompanied By: self ?activities of daily living that may affect ?Transfer Assistance: None ?risk of falls: ?Patient Identification Verified: Yes ?Hospitalized since last visit: No ?Secondary Verification Process Completed: Yes ?Has Dressing in Place as Prescribed: Yes ?Patient Requires Transmission-Based Precautions: No ?Has Compression in Place as Prescribed: Yes ?Patient Has Alerts: Yes ?Pain Present Now: Yes ?Patient Alerts: diabetic ?Electronic Signature(s) ?Signed: 07/30/2021 4:11:25 PM By: Levora Dredge ?Entered By: Levora Dredge on 07/30/2021 14:24:08 ?Randle, Emiko ([id number]) ?-------------------------------------------------------------------------------- ?Clinic Level of Care Assessment Details ?Patient Name: [name] ?Date of Service: 07/30/2021 2:15 PM ?Medical Record Number: [id number] ?Patient Account Number: [id number] ?Date of Birth/Sex: [birth year] (55 y.o. F) ?Treating RN: Levora Dredge ?Primary Care Yan Pankratz: Laurance Flatten Other Clinician: ?Referring Silva Aamodt: Laurance Flatten ?Treating Anwitha Mapes/Extender: Kalman Shan ?Weeks in Treatment: 12 ?Clinic Level of Care Assessment Items ?TOOL 1 Quantity Score ?'[]'$  - Use when EandM and Procedure is performed on INITIAL visit 0 ?ASSESSMENTS - Nursing Assessment / Reassessment ?'[]'$  - General Physical Exam  (combine w/ comprehensive assessment (listed just below) when performed on new ?0 ?pt. evals) ?'[]'$  - 0 ?Comprehensive Assessment (HX, ROS, Risk Assessments, Wounds Hx, etc.) ?ASSESSMENTS - Wound and Skin Assessment / Reassessment ?'[]'$  - Dermatologic / Skin Assessment (not related to wound area) 0 ?ASSESSMENTS - Ostomy and/or Continence Assessment and Care ?'[]'$  - Incontinence Assessment and Management 0 ?'[]'$  - 0 ?Ostomy Care Assessment and Management (repouching, etc.) ?PROCESS - Coordination of Care ?'[]'$  - Simple Patient / Family Education for ongoing care 0 ?'[]'$  - 0 ?Complex (extensive) Patient / Family Education for ongoing care ?'[]'$  - 0 ?Staff obtains Consents, Records, Test Results / Process Orders ?'[]'$  - 0 ?Staff telephones HHA, Nursing Homes / Clarify orders / etc ?'[]'$  - 0 ?Routine Transfer to another Facility (non-emergent condition) ?'[]'$  - 0 ?Routine Hospital Admission (non-emergent condition) ?'[]'$  - 0 ?New Admissions / Biomedical engineer / Ordering NPWT, Apligraf, etc. ?'[]'$  - 0 ?Emergency Hospital Admission (emergent condition) ?PROCESS - Special Needs ?'[]'$  - Pediatric / Minor Patient Management 0 ?'[]'$  - 0 ?Isolation Patient Management ?'[]'$  - 0 ?Hearing / Language / Visual special needs ?'[]'$  - 0 ?Assessment of Community assistance (transportation, D/C planning, etc.) ?'[]'$  - 0 ?Additional assistance / Altered mentation ?'[]'$  - 0 ?Support Surface(s) Assessment (bed, cushion, seat, etc.) ?INTERVENTIONS - Miscellaneous ?'[]'$  - External ear exam 0 ?'[]'$  - 0 ?Patient Transfer (multiple staff / Civil Service fast streamer / Similar devices) ?'[]'$  - 0 ?Simple Staple / Suture removal (25 or less) ?'[]'$  - 0 ?Complex Staple / Suture removal (26 or more) ?'[]'$  - 0 ?Hypo/Hyperglycemic Management (do not check if billed separately) ?'[]'$  - 0 ?Ankle / Brachial Index (ABI) - do not check if billed separately ?Has the patient been seen at the hospital within the last three years: Yes ?Total Score: 0 ?Level Of Care: ____ ?[place], [place] ([id number]) ?Electronic  Signature(s) ?Signed: 07/30/2021 4:11:25 PM By: Levora Dredge ?Entered By: Levora Dredge on 07/30/2021 15:53:29 ?Alexie, Khamiya ([id number]) ?-------------------------------------------------------------------------------- ?Encounter Discharge Information Details ?Patient  Name: [name] ?Date of Service: 07/30/2021 2:15 PM ?Medical Record Number: [id number] ?Patient Account Number: [id number] ?Date of Birth/Sex: 1965/09/24 (55 y.o. F) ?Treating RN: Levora Dredge ?Primary Care Rebbie Lauricella: Laurance Flatten Other Clinician: ?Referring Penney Domanski: Laurance Flatten ?Treating Kaelyn Nauta/Extender: Kalman Shan ?Weeks in Treatment: 12 ?Encounter Discharge Information Items Post Procedure Vitals ?Discharge Condition: Stable ?Temperature (?F): 97.8 ?Ambulatory Status: [name] ?Pulse (bpm): 108 ?Discharge Destination: Home ?Respiratory Rate (breaths/min): 18 ?Transportation: Private Auto ?Blood Pressure (mmHg): 149/75 ?Accompanied By: self ?Schedule Follow-up Appointment: Yes ?Clinical Summary of Care: Patient Declined ?Electronic Signature(s) ?Signed: 07/30/2021 3:56:45 PM By: Levora Dredge ?Previous Signature: 07/30/2021 3:55:06 PM Version By: Levora Dredge ?Entered By: Levora Dredge on 07/30/2021 15:56:45 ?Fudala, Aaima ([id number]) ?-------------------------------------------------------------------------------- ?Lower Extremity Assessment Details ?Patient Name: [name] ?Date of Service: 07/30/2021 2:15 PM ?Medical Record Number: [id number] ?Patient Account Number: [id number] ?Date of Birth/Sex: March 07, 1966 (55 y.o. F) ?Treating RN: Levora Dredge ?Primary Care Liliani Bobo: Laurance Flatten Other Clinician: ?Referring Kunta Hilleary: Laurance Flatten ?Treating Jeremaine Maraj/Extender: Kalman Shan ?Weeks in Treatment: 12 ?Edema Assessment ?Assessed: [Left: No] [Right: No] ?[Left: Edema] [Right: :] ?Calf ?Left: Right: ?Point of Measurement: 35 cm From Medial Instep 49 cm ?Ankle ?Left: Right: ?Point of Measurement: 10 cm From Medial Instep 24.2  cm ?Electronic Signature(s) ?Signed: 07/30/2021 4:11:25 PM By: Levora Dredge ?Entered By: Levora Dredge on 07/30/2021 14:37:31 ?Gentzler, Memorie ([id number]) ?-------------------------------------------------------------------------------- ?Multi Wound Chart Details ?Patient Name: SHAKEITHA, UMBAUGH ?Date of Service: 07/30/2021 2:15 PM ?Medical Record Number: [id number] ?Patient Account Number: [id number] ?Date of Birth/Sex: 1965-08-28 (55 y.o. F) ?Treating RN: Levora Dredge ?Primary Care Haydan Mansouri: Laurance Flatten Other Clinician: ?Referring Olesya Wike: Laurance Flatten ?Treating Kenyette Gundy/Extender: Kalman Shan ?Weeks in Treatment: 12 ?Vital Signs ?Height(in): ?Pulse(bpm): 108 ?Weight(lbs): ?Blood Pressure(mmHg): 149/75 ?Body Mass Index(BMI): ?Temperature(??F): 97.8 ?Respiratory Rate(breaths/min): 18 ?Photos: [N/A:N/A] ?Wound Location: Right, Medial Lower Leg Right, Midline Foot N/A ?Wounding Event: Blister Surgical Injury N/A ?Primary Etiology: Diabetic Wound/Ulcer of the Lower Open Surgical Wound N/A ?Extremity ?Comorbid History: Sleep Apnea, Congestive Heart Sleep Apnea, Congestive Heart N/A ?Failure, Coronary Artery Disease, Failure, Coronary Artery Disease, ?Hypertension, Myocardial Infarction, Hypertension, Myocardial Infarction, ?Peripheral Venous Disease, Type II Peripheral Venous Disease, Type II ?Diabetes, End Stage Renal Disease, Diabetes, End Stage Renal Disease, ?Gout, Neuropathy Gout, Neuropathy ?Date Acquired: 07/20/2021 03/13/2021 N/A ?Weeks of Treatment: 1 12 N/A ?Wound Status: Open Open N/A ?Wound Recurrence: No No N/A ?Pending Amputation on No Yes N/A ?Presentation: ?Measurements L x W x D (cm) 5.5x9.7x0.1 0.3x0.6x0.2 N/A ?Area (cm?) : 41.901 0.141 N/A ?Volume (cm?) : 4.19 0.028 N/A ?% Reduction in Area: -4.60% 97.30% N/A ?% Reduction in Volume: -4.60% 98.90% N/A ?Classification: Grade 1 Full Thickness Without Exposed N/A ?Support Structures ?Exudate Amount: Medium Medium N/A ?Exudate Type: Serosanguineous  Serosanguineous N/A ?Exudate Color: red, brown red, brown N/A ?Granulation Amount: Large (67-100%) Large (67-100%) N/A ?Granulation Quality: Red Pale N/A ?Necrotic Amount: Small (1-33%) Small (1-33%) N/A ?Ex

## 2021-08-06 ENCOUNTER — Encounter (HOSPITAL_BASED_OUTPATIENT_CLINIC_OR_DEPARTMENT_OTHER): Payer: Medicare Other | Admitting: Internal Medicine

## 2021-08-06 DIAGNOSIS — L97811 Non-pressure chronic ulcer of other part of right lower leg limited to breakdown of skin: Secondary | ICD-10-CM

## 2021-08-06 DIAGNOSIS — L97512 Non-pressure chronic ulcer of other part of right foot with fat layer exposed: Secondary | ICD-10-CM | POA: Diagnosis not present

## 2021-08-06 DIAGNOSIS — E11621 Type 2 diabetes mellitus with foot ulcer: Secondary | ICD-10-CM | POA: Diagnosis not present

## 2021-08-09 ENCOUNTER — Other Ambulatory Visit (HOSPITAL_COMMUNITY): Payer: Self-pay | Admitting: Cardiology

## 2021-08-09 NOTE — Progress Notes (Signed)
DENNI, [place] ([id number]) Visit Report for 08/06/2021 Chief Complaint Document Details Patient Name: LACHRISHA, ZIEBARTH Date of Service: 08/06/2021 3:00 PM Medical Record Number: [id number] Patient Account Number: [id number] Date of Birth/Sex: September 04, 1965 (56 y.o. F) Treating RN: Levora Dredge Primary Care Provider: Laurance Flatten Other Clinician: Referring Provider: Laurance Flatten Treating Provider/Extender: Yaakov Guthrie in Treatment: 13 Information Obtained from: Patient Chief Complaint 05/07/2021; surgical wound dehiscence to the right great toe amputation site. 07/23/2021; patient developed a wound to the anterior right lower extremity limited to skin breakdown from a blister. Electronic Signature(s) Signed: 08/06/2021 4:30:23 PM By: Kalman Shan DO Entered By: Kalman Shan on 08/06/2021 16:16:00 Sloniker, [name] ([id number]) -------------------------------------------------------------------------------- Debridement Details Patient Name: [name] Date of Service: 08/06/2021 3:00 PM Medical Record Number: [id number] Patient Account Number: [id number] Date of Birth/Sex: Oct 24, 1965 (55 y.o. F) Treating RN: Alycia Rossetti Primary Care Provider: Laurance Flatten Other Clinician: Referring Provider: Laurance Flatten Treating Provider/Extender: Yaakov Guthrie in Treatment: 13 Debridement Performed for Wound #10RR Right,Plantar Foot Assessment: Performed By: Physician Kalman Shan, MD Debridement Type: Debridement Severity of Tissue Pre Debridement: Fat layer exposed Level of Consciousness (Pre- Awake and Alert procedure): Pre-procedure Verification/Time Out Yes - 15:43 Taken: Pain Control: Lidocaine 4% Topical Solution Total Area Debrided (L x W): 0.3 (cm) x 1.7 (cm) = 0.51 (cm) Tissue and other material Viable, Non-Viable, Slough, Subcutaneous, Slough debrided: Level: Skin/Subcutaneous Tissue Debridement Description: Excisional Instrument: Curette Bleeding:  Moderate Hemostasis Achieved: Pressure Response to Treatment: Procedure was tolerated well Level of Consciousness (Post- Awake and Alert procedure): Post Debridement Measurements of Total Wound Length: (cm) 0.3 Width: (cm) 1.7 Depth: (cm) 0.3 Volume: (cm) 0.12 Character of Wound/Ulcer Post Debridement: Stable Severity of Tissue Post Debridement: Fat layer exposed Post Procedure Diagnosis Same as Pre-procedure Electronic Signature(s) Signed: 08/06/2021 4:30:23 PM By: Kalman Shan DO Signed: 08/08/2021 8:14:28 AM By: Alycia Rossetti Entered By: Alycia Rossetti on 08/06/2021 15:45:43 Riggin, Darius ([id number]) -------------------------------------------------------------------------------- Debridement Details Patient Name: [name] Date of Service: 08/06/2021 3:00 PM Medical Record Number: [id number] Patient Account Number: [id number] Date of Birth/Sex: 08/22/1965 (56 y.o. F) Treating RN: Alycia Rossetti Primary Care Provider: Laurance Flatten Other Clinician: Referring Provider: Laurance Flatten Treating Provider/Extender: Yaakov Guthrie in Treatment: 13 Debridement Performed for Wound #11 Right,Medial Lower Leg Assessment: Performed By: Physician Kalman Shan, MD Debridement Type: Debridement Severity of Tissue Pre Debridement: Fat layer exposed Level of Consciousness (Pre- Awake and Alert procedure): Pre-procedure Verification/Time Out Yes - 15:45 Taken: Total Area Debrided (L x W): 6.1 (cm) x 10 (cm) = 61 (cm) Tissue and other material Viable, Non-Viable, Slough, Subcutaneous, Slough debrided: Level: Skin/Subcutaneous Tissue Debridement Description: Excisional Instrument: Curette Bleeding: Minimum Hemostasis Achieved: Pressure Response to Treatment: Procedure was tolerated well Level of Consciousness (Post- Awake and Alert procedure): Post Debridement Measurements of Total Wound Length: (cm) 6.1 Width: (cm) 10 Depth: (cm) 0.1 Volume: (cm)  4.791 Character of Wound/Ulcer Post Debridement: Stable Severity of Tissue Post Debridement: Fat layer exposed Post Procedure Diagnosis Same as Pre-procedure Electronic Signature(s) Signed: 08/06/2021 4:30:23 PM By: Kalman Shan DO Signed: 08/08/2021 8:14:28 AM By: Alycia Rossetti Entered By: Alycia Rossetti on 08/06/2021 15:47:17 Sackrider, Charmion ([id number]) -------------------------------------------------------------------------------- HPI Details Patient Name: [name] Date of Service: 08/06/2021 3:00 PM Medical Record Number: [id number] Patient Account Number: [id number] Date of Birth/Sex: 1965/04/01 (56 y.o. F) Treating RN: Levora Dredge Primary Care Provider: Laurance Flatten Other Clinician: Referring Provider: Laurance Flatten Treating Provider/Extender: Yaakov Guthrie in Treatment: 13 History of Present Illness HPI Description: 10/24/2018 on evaluation today  patient presents for initial evaluation or clinic concerning issues that she has been having with lymphedema for quite some time. Unfortunately she has several wound openings at this point that secondary to her lymphedema/venous stasis are giving her trouble and leaking quite severely. She also has diabetes along with hypertension and stage III chronic kidney disease. Fortunately there is no signs of active infection at this time. She is going to likely require debridement of the left leg ulcer upon evaluation today just based on what I am seeing. Fortunately there is no wound opening on the right. Overall the patient seems to be doing quite well and again there is no evidence of systemic infection which is good news. No fevers, chills, nausea, vomiting, or diarrhea. 10/31/2018 on evaluation today patient actually appears to be doing excellent in regard to her lower extremity ulcers. She has been tolerating the dressing changes without complication. Fortunately there is no signs of active infection at this time. She has  tolerated 3 layer compression wrap without complication. 11/07/2018 upon evaluation today patient actually appears to be doing very well with regard to her left lower extremity ulcers. She has been tolerating the dressing changes without complication. Fortunately there is no signs of active infection. No fevers, chills, nausea, vomiting, or diarrhea. 11/21/2018 upon evaluation today patient appears to be doing quite well with regard to her lower extremity ulcers. In fact both areas seem to be showing signs of good improvement which is excellent. She is not having as much pain as she has in the past and again has a lot of healing compared to previous visits as well. 12/05/2018 on evaluation today patient presents for follow-up concerning her ongoing issues with her bilateral lower extremity ulcers. The good news is her right lower extremity is showing signs of completely healing at this time which is great news. Fortunately there is no evidence of active infection. On the left she has just a very small area still remaining that is open at this time all in all she is very close to complete closure in my opinion. She does have compression stockings to wear at home. 12/12/2018 on evaluation today patient appears to be doing excellent in regard to her left lateral lower extremity ulcer. She has been tolerating the dressing changes without complication. Fortunately there is no signs of active infection at this time. In fact this appears to be pretty much healed at this point although again she is not 100% today. No fevers, chills, nausea, vomiting, or diarrhea. 12/19/2018 on evaluation today patient actually appears to be doing excellent with regard to her lower extremity ulcer in fact this appears to be completely healed today which is all some. She has done extremely well with wound care measures. No fevers, chills, nausea, vomiting, or diarrhea. ------------------------------------ 11/01/19-Readmission to  the clinic Patient presents with left leg pain, wounds posterior calf, onset about 4 weeks, denies any fevers chills or shakes, has not been using anything to these wounds, was given compression stockings at last discharge from the clinic but has not been able to use them as they rolled down and cause creases Patient's history significant for type 2 diabetes insulin requiring, A1c of 6.9 lately, hypertension, chronic pain 8/18; patient readmitted that the clinic last week. She has wounds on her left lateral posterior lower leg all of this in close juxtaposition i.e. a localized site. We've been using silver alginate under 3 layer compression apparently the wound surface area is better. She was wearing compression stockings  from elastic therapy but says they were falling down. As she progresses more towards healing will need to address what we use in terms of compression stockings perhaps external compression garments 11/17/2019 on evaluation today patient appears to be doing well with regard to her legs currently. She does have 2 wounds which are actually measuring much better the more posterior is actually doing very well which I am pleased with the other though smaller is not quite a small but nonetheless on the lateral aspect does seem to be improving. 11/23/2019 on evaluation today patient appears to be doing well in regard to her wounds. In fact the wound on the posterior aspect of her leg appears healed the lateral aspect is still open but extremely small. 9/15; this is a patient with chronic venous insufficiency and secondary lymphedema. She did not keep her clinic appointment last week and hence the left leg is a lot more swollen since she had to take the wrap off at some point. She has a small weeping area on the left lateral lower leg. She has probably a juxta lite stocking for the left leg I have asked her to bring that in when she comes to her clinic appointment next week at which time she  should be healed 10/6; this is a patient with chronic venous insufficiency and secondary lymphedema left greater than right. Skin changes of chronic lymphedema especially in the left leg. She has not been here in 3 weeks. She took the wrap off 2 weeks ago. She has some very old 20/30 stockings from elastic therapy in [place] she has been using. Fortunately her leg is closed. She also has a single [place] wrap for the left leg Readmission: 06/18/2020 upon evaluation today patient presents for reevaluation here in the clinic. She is a long-term patient that we have seen intermittently when she has had flareups of issues with her lower extremity secondary to chronic venous stasis. With that being said unfortunately today she is having a significant issue with a wound which is actually quite significant over the right lower extremity. She did see dermatology they put Vaseline followed by [place] dressings on her unfortunately she is continuing to have significant issues however here with pain and in fact this wrap was extremely stuck to her upon evaluation today. Nonetheless I believe that she is going to require a little bit different approach to try to keep things from sticking and hopefully aid in getting this to heal more effectively and quickly. With that being said she does have a history of Solanki, Anael ([id number]) chronic venous insufficiency, lymphedema, diabetes mellitus type 2, chronic kidney disease stage III, hypertension, and congestive heart failure. This started as an injury in mid February and has worsened over the past 2 weeks. 07/04/2020 upon evaluation today patient appears to be doing well with regard to her wound on the leg. Overall I am extremely pleased with where things stand and I do think that she is making excellent progress. There is no sign of infection right now which is also great news. I think that she is close to complete resolution. 07/09/20 upon evaluation today  patient appears to be doing excellent in regard to her leg ulcer. She has been tolerating the dressing changes without complication and in general I am extremely pleased with where things stand today. There does not appear to be any signs of active infection at this time which is great news. 07/25/2020 upon evaluation today patient appears to be doing well  with regard to her wound. She has been tolerating the dressing changes without complication. Fortunately there is no signs of active infection at this time and her wound appears to be completely healed. Readmission: 09/30/2020 upon evaluation today patient appears for reevaluation here in our clinic. She was discharged May 5 that unfortunately has started to have weeping and wounds on the left leg at this point. She tells me the right leg did weep some but nothing as significant as the left. With all that being said at this point the patient states that she figured it was best to come in here and she was having a lot of drainage and it was get all of her sheets and everything. She has been wearing her Velcro wraps sometimes but it does not sound like she is been wearing them all the time which I think is part of the reason why this reopened. She also unfortunately had a fall which was quite significant. This occurred I believe about a week or 2 ago. Otherwise patient's past medical history really has not changed significantly. 10/07/2020 upon evaluation today patient appears to be doing better in regard to her wounds of the lower extremities. Fortunately there does not appear to be any signs of active infection at this time which is great news. I do not see any evidence of anything worsening but again she continues to have significant bilateral stage III lymphedema. This has been an ongoing issue. We actually have seen her this year pretty much from March through May of 2022. Following this she continued with compression at home following but then  unfortunately reopened again. That was on July 11. With that being said this is an ongoing and recurrent issue for which honestly have seen her over the past 2 years pretty much consistently. Nonetheless we have had Velcro compression wraps that she uses at home and despite this she continues to have frequent and recurrent ulcerations. 7/27; patient presents for 1 week follow-up. She has no issues or complaints today. She denies signs of infection. 10/21/2020 upon evaluation today patient's leg actually appears to be doing decently well on the left there is nothing open on the right at all and I am pleased in that regard. On the left side I do think that she still has some areas of weeping I believe the compression wrap is still the best way to go although I am hopeful that we are headed in the right direction as far as getting this area to heal and silver. Upon inspection patient's wound bed actually showed signs of good granulation epithelization at this point. There does not appear to be any evidence of active infection which is great news and overall I am extremely pleased with where things stand. 11/04/2020 upon evaluation today patient appears to be doing well with regard to her left leg. In fact this is getting very close to complete granulation and epithelization at this point I am very pleased with where we stand. I do not see any signs of infection currently. 11/18/2020 upon evaluation today patient appears to be doing well in regard to her wound. She is in fact doing extremely well considering the fact we did need to see her last week she was apparently sick and missed her appointment. She is therefore had the wrap on for 2 weeks. Fortunately there is no signs of active infection at this time. No fevers, chills, nausea, vomiting, or diarrhea. 11/28/2020 upon evaluation today patient appears to be doing well with  regard to her legs. Things are healing nicely and overall I do not see any signs of  infection at this time which is great news. No fevers, chills, nausea, vomiting, or diarrhea. With that being said she still has a couple areas that are open and weeping currently. 12/10/2020 upon evaluation today patient appears to be doing well with regard to her wound. She has been tolerating the dressing changes without complication. Fortunately there does not appear to be any signs of active infection at this time. No fevers, chills, nausea, vomiting, or diarrhea. 10/11; patient comes in today with really excellent edema control. Everything appears to be closed in clinic occluding the left lateral. The area on the right anterior has some eschar on the top I did not remove this. She has dry scaly skin which might benefit from ammonium lactate but for now I just discharged her with a moisturizer. The patient is also apparently fractured her right great toe. We gave her some stockinette to wear under her juxta lite on that side Readmission 05/07/2021 Ms. 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 There was a revision of the site on 03/13/2021. She has been using gauze to the wound site.She reports pain to the medial aspect with drainage. Home health was changing the dressing however wound care visits have expired. She is trying to do the dressing change on her own but reports having trouble with this. She currently denies fever/chills. 2/22; patient presents for follow-up. She reports taking Augmentin for the past week. She reports heartburn from this and would like the solution version. She is on doxycycline prescribed by another provider for the next few months. She currently denies systemic signs of infection. She  is scheduled for her MRI On 2/27. She has home health and is changing the dressing with Hydrofera Blue. She is unable to do this daily. She has a family member present to help watch how to do the dressing. 3/1; patient presents for follow-up. She was able to obtain the solution for Augmentin from her pharmacy however there was a short supply and this is on backorder currently. She continues to take doxycycline as prescribed. She reports improvement in her foot pain. She had her MRI completed. She states she has not scheduled an appointment with vein and vascular. She is doing [employer] with dressing changes. She denies systemic signs of infection. 3/8; patient presents for follow-up. She reports improvement in wound healing. She has been using Hydrofera Blue. She has not been able to get in contact with vein and vascular to schedule an appointment to see her vascular surgeon. She states she has an appointment with her orthopedic surgeon at the end of the month. ARLETH, MCCULLAR ([id number]) 3/29; patient has missed her last follow-up schedule two weeks ago. Since then she has been evaluated by Dr. Wilhelmenia Blase, vascular surgery. There is nothing further to do from a vascular perspective. She has adequate blood flow for healing. She missed her appointment with Dr. Doreatha Lew her surgeon yesterday. She completed her course of Bactrim that I prescribed. She reports minimal drainage from the wound bed. She reports improvement in wound healing. She is using Hydrofera Blue to the wound beds. She states that home health has discharged  her. She denies signs of infection. 4/5; patient presents for follow-up. She has been using Hydrofera Blue daily to the wound beds. She denies signs of infection. She has no issues or complaints today. 4/19; patient presents for follow-up. She has been using Hydrofera Blue daily to the wound beds. A small area on the medial aspect of the foot had blistered up and opened.  Currently she denies signs of infection. 4/26; patient presents for follow-up. She has been using Hydrofera Blue with no issues. The medial aspect of the foot that had blistered up and opened at previous clinic visit has healed. She denies signs of infection. 5/3; Patient presents for follow-up. She has been trying to use Hydrofera Blue to the wound bed however this is not staying in place very well. Unfortunately she has developed another wound to the right anterior lower extremity. It was previously a blister. There is still devitalized tissue present. She currently denies signs of infection. 5/10; patient presents for follow-up. She states that the compression wrap Slipped down her leg after it was placed last week.. She reports using gentamicin ointment to the foot wound. She currently denies signs of infection. 5/17; patient presents for follow-up. She states that the compression wrap did not stay in place. She reports she had dried skin to the previous wound site on the right bottom foot for which she removed. She states now she has a wound there. She currently denies signs of infection. Electronic Signature(s) Signed: 08/06/2021 4:30:23 PM By: Kalman Shan DO Entered By: Kalman Shan on 08/06/2021 16:17:28 Rissmiller, [name] ([id number]) -------------------------------------------------------------------------------- Physical Exam Details Patient Name: [name] Date of Service: 08/06/2021 3:00 PM Medical Record Number: [id number] Patient Account Number: [id number] Date of Birth/Sex: Jun 08, 1965 (55 y.o. F) Treating RN: Levora Dredge Primary Care Provider: Laurance Flatten Other Clinician: Referring Provider: Laurance Flatten Treating Provider/Extender: Yaakov Guthrie in Treatment: 13 Constitutional . Cardiovascular . Psychiatric . Notes Right foot: The great toe amputation site has epithelialized . To the plantar aspect on the medial side there is an open wound with 0.3 cm  of depth. no surrounding signs of infection. Right lower extremity: To the anterior aspect there Is an open wound with granulation tissue and nonviable tissue. No surrounding signs of infection. Electronic Signature(s) Signed: 08/06/2021 4:30:23 PM By: Kalman Shan DO Entered By: Kalman Shan on 08/06/2021 16:18:32 Pezzullo, [name] ([id number]) -------------------------------------------------------------------------------- Physician Orders Details Patient Name: [name] Date of Service: 08/06/2021 3:00 PM Medical Record Number: [id number] Patient Account Number: [id number] Date of Birth/Sex: 03/28/1965 (55 y.o. F) Treating RN: Alycia Rossetti Primary Care Provider: Laurance Flatten Other Clinician: Referring Provider: Laurance Flatten Treating Provider/Extender: Yaakov Guthrie in Treatment: 51 Verbal / Phone Orders: No Diagnosis Coding Follow-up Appointments o Return Appointment in 1 week. o Nurse Visit as needed o Other: - [facility] has patient on list to call back and set up appointment to be seen Bathing/ Shower/ Hygiene o Clean wound with Normal Saline or wound cleanser. o Wash wounds with antibacterial soap and water. o May shower with wound dressing protected with water repellent cover or cast protector. o No tub bath. Edema Control - Lymphedema / Segmental Compressive Device / Other o Elevate, Exercise Daily and Avoid Standing for Long Periods of Time. o Elevate leg(s) parallel to the floor when sitting. o DO YOUR BEST to sleep in the bed at night. DO NOT sleep in your recliner. Long hours of sitting in a recliner leads to swelling of the legs and/or potential wounds on  your backside. Wound Treatment Wound #10RR - Foot Wound Laterality: Plantar, Right Cleanser: Normal Saline 1 x Per Day/30 Days Discharge Instructions: Wash your hands with soap and water. Remove old dressing, discard into plastic bag and place into trash. Cleanse the wound with  Normal Saline prior to applying a clean dressing using gauze sponges, not tissues or cotton balls. Do not scrub or use excessive force. Pat dry using gauze sponges, not tissue or cotton balls. Cleanser: Soap and Water 1 x Per Day/30 Days Discharge Instructions: Gently cleanse wound with antibacterial soap, rinse and pat dry prior to dressing wounds Topical: Gentamicin 1 x Per Day/30 Days Discharge Instructions: Apply as directed by provider. Primary Dressing: (BORDER) Zetuvit Plus Silicone Border Dressing 4x4 (in/in) (DME) (Generic) 1 x Per Day/30 Days Wound #11 - Lower Leg Wound Laterality: Right, Medial Cleanser: Normal Saline 1 x Per Day/30 Days Discharge Instructions: Wash your hands with soap and water. Remove old dressing, discard into plastic bag and place into trash. Cleanse the wound with Normal Saline prior to applying a clean dressing using gauze sponges, not tissues or cotton balls. Do not scrub or use excessive force. Pat dry using gauze sponges, not tissue or cotton balls. Cleanser: Soap and Water 1 x Per Day/30 Days Discharge Instructions: Gently cleanse wound with antibacterial soap, rinse and pat dry prior to dressing wounds Topical: Gentamicin 1 x Per Day/30 Days Discharge Instructions: Apply as directed by provider. Primary Dressing: (BORDER) Zetuvit Plus SILICONE BORDER Dressing 5x5 (in/in) (DME) (Generic) 1 x Per Day/30 Days Electronic Signature(s) Signed: 08/06/2021 4:30:23 PM By: Kalman Shan DO Entered By: Kalman Shan on 08/06/2021 16:23:23 Bluestein, [name] ([id number]) -------------------------------------------------------------------------------- Problem List Details Patient Name: [name] Date of Service: 08/06/2021 3:00 PM Medical Record Number: [id number] Patient Account Number: [id number] Date of Birth/Sex: 04/21/1965 (55 y.o. F) Treating RN: Levora Dredge Primary Care Provider: Laurance Flatten Other Clinician: Referring Provider: Laurance Flatten Treating Provider/Extender: Yaakov Guthrie in Treatment: 13 Active Problems ICD-10 Encounter Code Description Active Date MDM Diagnosis E11.621 Type 2 diabetes mellitus with foot ulcer 05/07/2021 No Yes T81.30XA Disruption of wound, unspecified, initial encounter 05/07/2021 No Yes L97.512 Non-pressure chronic ulcer of other part of right foot with fat layer 05/07/2021 No Yes exposed L97.811 Non-pressure chronic ulcer of other part of right lower leg limited to 07/23/2021 No Yes breakdown of skin M86.9 Osteomyelitis, unspecified 05/07/2021 No Yes Inactive Problems Resolved Problems Electronic Signature(s) Signed: 08/06/2021 4:30:23 PM By: Kalman Shan DO Entered By: Kalman Shan on 08/06/2021 16:15:57 Munos, Marinna ([id number]) -------------------------------------------------------------------------------- Progress Note Details Patient Name: [name] Date of Service: 08/06/2021 3:00 PM Medical Record Number: [id number] Patient Account Number: [id number] Date of Birth/Sex: 1965-10-21 (56 y.o. F) Treating RN: Levora Dredge Primary Care Provider: Laurance Flatten Other Clinician: Referring Provider: Laurance Flatten Treating Provider/Extender: Yaakov Guthrie in Treatment: 13 Subjective Chief Complaint Information obtained from Patient 05/07/2021; surgical wound dehiscence to the right great toe amputation site. 07/23/2021; patient developed a wound to the anterior right lower extremity limited to skin breakdown from a blister. History of Present Illness (HPI) 10/24/2018 on evaluation today patient presents for initial evaluation or clinic concerning issues that she has been having with lymphedema for quite some time. Unfortunately she has several wound openings at this point that secondary to her lymphedema/venous stasis are giving her trouble and leaking quite severely. She also has diabetes along with hypertension and stage III chronic kidney disease. Fortunately  there is no signs of active infection at this time. She  is going to likely require debridement of the left leg ulcer upon evaluation today just based on what I am seeing. Fortunately there is no wound opening on the right. Overall the patient seems to be doing quite well and again there is no evidence of systemic infection which is good news. No fevers, chills, nausea, vomiting, or diarrhea. 10/31/2018 on evaluation today patient actually appears to be doing excellent in regard to her lower extremity ulcers. She has been tolerating the dressing changes without complication. Fortunately there is no signs of active infection at this time. She has tolerated 3 layer compression wrap without complication. 11/07/2018 upon evaluation today patient actually appears to be doing very well with regard to her left lower extremity ulcers. She has been tolerating the dressing changes without complication. Fortunately there is no signs of active infection. No fevers, chills, nausea, vomiting, or diarrhea. 11/21/2018 upon evaluation today patient appears to be doing quite well with regard to her lower extremity ulcers. In fact both areas seem to be showing signs of good improvement which is excellent. She is not having as much pain as she has in the past and again has a lot of healing compared to previous visits as well. 12/05/2018 on evaluation today patient presents for follow-up concerning her ongoing issues with her bilateral lower extremity ulcers. The good news is her right lower extremity is showing signs of completely healing at this time which is great news. Fortunately there is no evidence of active infection. On the left she has just a very small area still remaining that is open at this time all in all she is very close to complete closure in my opinion. She does have compression stockings to wear at home. 12/12/2018 on evaluation today patient appears to be doing excellent in regard to her left lateral  lower extremity ulcer. She has been tolerating the dressing changes without complication. Fortunately there is no signs of active infection at this time. In fact this appears to be pretty much healed at this point although again she is not 100% today. No fevers, chills, nausea, vomiting, or diarrhea. 12/19/2018 on evaluation today patient actually appears to be doing excellent with regard to her lower extremity ulcer in fact this appears to be completely healed today which is all some. She has done extremely well with wound care measures. No fevers, chills, nausea, vomiting, or diarrhea. ------------------------------------ 11/01/19-Readmission to the clinic Patient presents with left leg pain, wounds posterior calf, onset about 4 weeks, denies any fevers chills or shakes, has not been using anything to these wounds, was given compression stockings at last discharge from the clinic but has not been able to use them as they rolled down and cause creases Patient's history significant for type 2 diabetes insulin requiring, A1c of 6.9 lately, hypertension, chronic pain 8/18; patient readmitted that the clinic last week. She has wounds on her left lateral posterior lower leg all of this in close juxtaposition i.e. a localized site. We've been using silver alginate under 3 layer compression apparently the wound surface area is better. She was wearing compression stockings from [facility] but says they were falling down. As she progresses more towards healing will need to address what we use in terms of compression stockings perhaps external compression garments 11/17/2019 on evaluation today patient appears to be doing well with regard to her legs currently. She does have 2 wounds which are actually measuring much better the more posterior is actually doing very well which I am  pleased with the other though smaller is not quite a small but nonetheless on the lateral aspect does seem to be  improving. 11/23/2019 on evaluation today patient appears to be doing well in regard to her wounds. In fact the wound on the posterior aspect of her leg appears healed the lateral aspect is still open but extremely small. 9/15; this is a patient with chronic venous insufficiency and secondary lymphedema. She did not keep her clinic appointment last week and hence the left leg is a lot more swollen since she had to take the wrap off at some point. She has a small weeping area on the left lateral lower leg. She has probably a juxta lite stocking for the left leg I have asked her to bring that in when she comes to her clinic appointment next week at which time she should be healed 10/6; this is a patient with chronic venous insufficiency and secondary lymphedema left greater than right. Skin changes of chronic lymphedema especially in the left leg. She has not been here in 3 weeks. She took the wrap off 2 weeks ago. She has some very old 20/30 stockings from elastic therapy in [place] she has been using. Fortunately her leg is closed. She also has a single [place] wrap for the left leg Readmission: HALINA, ASANO ([id number]) 06/18/2020 upon evaluation today patient presents for reevaluation here in the clinic. She is a long-term patient that we have seen intermittently when she has had flareups of issues with her lower extremity secondary to chronic venous stasis. With that being said unfortunately today she is having a significant issue with a wound which is actually quite significant over the right lower extremity. She did see dermatology they put Vaseline followed by [place] dressings on her unfortunately she is continuing to have significant issues however here with pain and in fact this wrap was extremely stuck to her upon evaluation today. Nonetheless I believe that she is going to require a little bit different approach to try to keep things from sticking and hopefully aid in getting this to  heal more effectively and quickly. With that being said she does have a history of chronic venous insufficiency, lymphedema, diabetes mellitus type 2, chronic kidney disease stage III, hypertension, and congestive heart failure. This started as an injury in mid February and has worsened over the past 2 weeks. 07/04/2020 upon evaluation today patient appears to be doing well with regard to her wound on the leg. Overall I am extremely pleased with where things stand and I do think that she is making excellent progress. There is no sign of infection right now which is also great news. I think that she is close to complete resolution. 07/09/20 upon evaluation today patient appears to be doing excellent in regard to her leg ulcer. She has been tolerating the dressing changes without complication and in general I am extremely pleased with where things stand today. There does not appear to be any signs of active infection at this time which is great news. 07/25/2020 upon evaluation today patient appears to be doing well with regard to her wound. She has been tolerating the dressing changes without complication. Fortunately there is no signs of active infection at this time and her wound appears to be completely healed. Readmission: 09/30/2020 upon evaluation today patient appears for reevaluation here in our clinic. She was discharged May 5 that unfortunately has started to have weeping and wounds on the left leg at this point. She tells  me the right leg did weep some but nothing as significant as the left. With all that being said at this point the patient states that she figured it was best to come in here and she was having a lot of drainage and it was get all of her sheets and everything. She has been wearing her Velcro wraps sometimes but it does not sound like she is been wearing them all the time which I think is part of the reason why this reopened. She also unfortunately had a fall which was quite  significant. This occurred I believe about a week or 2 ago. Otherwise patient's past medical history really has not changed significantly. 10/07/2020 upon evaluation today patient appears to be doing better in regard to her wounds of the lower extremities. Fortunately there does not appear to be any signs of active infection at this time which is great news. I do not see any evidence of anything worsening but again she continues to have significant bilateral stage III lymphedema. This has been an ongoing issue. We actually have seen her this year pretty much from March through May of 2022. Following this she continued with compression at home following but then unfortunately reopened again. That was on July 11. With that being said this is an ongoing and recurrent issue for which honestly have seen her over the past 2 years pretty much consistently. Nonetheless we have had Velcro compression wraps that she uses at home and despite this she continues to have frequent and recurrent ulcerations. 7/27; patient presents for 1 week follow-up. She has no issues or complaints today. She denies signs of infection. 10/21/2020 upon evaluation today patient's leg actually appears to be doing decently well on the left there is nothing open on the right at all and I am pleased in that regard. On the left side I do think that she still has some areas of weeping I believe the compression wrap is still the best way to go although I am hopeful that we are headed in the right direction as far as getting this area to heal and silver. Upon inspection patient's wound bed actually showed signs of good granulation epithelization at this point. There does not appear to be any evidence of active infection which is great news and overall I am extremely pleased with where things stand. 11/04/2020 upon evaluation today patient appears to be doing well with regard to her left leg. In fact this is getting very close to  complete granulation and epithelization at this point I am very pleased with where we stand. I do not see any signs of infection currently. 11/18/2020 upon evaluation today patient appears to be doing well in regard to her wound. She is in fact doing extremely well considering the fact we did need to see her last week she was apparently sick and missed her appointment. She is therefore had the wrap on for 2 weeks. Fortunately there is no signs of active infection at this time. No fevers, chills, nausea, vomiting, or diarrhea. 11/28/2020 upon evaluation today patient appears to be doing well with regard to her legs. Things are healing nicely and overall I do not see any signs of infection at this time which is great news. No fevers, chills, nausea, vomiting, or diarrhea. With that being said she still has a couple areas that are open and weeping currently. 12/10/2020 upon evaluation today patient appears to be doing well with regard to her wound. She has been tolerating the  dressing changes without complication. Fortunately there does not appear to be any signs of active infection at this time. No fevers, chills, nausea, vomiting, or diarrhea. 10/11; patient comes in today with really excellent edema control. Everything appears to be closed in clinic occluding the left lateral. The area on the right anterior has some eschar on the top I did not remove this. She has dry scaly skin which might benefit from ammonium lactate but for now I just discharged her with a moisturizer. The patient is also apparently fractured her right great toe. We gave her some stockinette to wear under her juxta lite on that side Readmission 05/07/2021 Ms. 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 She had an amputation by  Dr. Doreatha Lew to the right great toe on 01/31/2021. There was a revision of the site on 03/13/2021. She has been using gauze to the wound site.She reports pain to the medial aspect with drainage. Home health was changing the dressing however wound care visits have expired. She is trying to do the dressing change on her own but reports having trouble with this. She currently denies fever/chills. 2/22; patient presents for follow-up. She reports taking Augmentin for the past week. She reports heartburn from this and would like the solution version. She is on doxycycline prescribed by another provider for the next few months. She currently denies systemic signs of infection. She is scheduled for her MRI On 2/27. She has home health and is changing the dressing with Hydrofera Blue. She is unable to do this daily. She has a family member present to help watch how to do the dressing. 3/1; patient presents for follow-up. She was able to obtain the solution for Augmentin from her pharmacy however there was a short supply and this is on backorder currently. She continues to take doxycycline as prescribed. She reports improvement in her foot pain. She had her MRI completed. She states she has not scheduled an appointment with vein and vascular. She is doing [employer] with dressing changes. She Kleeman, Lakeesha ([id number]) denies systemic signs of infection. 3/8; patient presents for follow-up. She reports improvement in wound healing. She has been using Hydrofera Blue. She has not been able to get in contact with vein and vascular to schedule an appointment to see her vascular surgeon. She states she has an appointment with her orthopedic surgeon at the end of the month. 3/29; patient has missed her last follow-up schedule two weeks ago. Since then she has been evaluated by Dr. Wilhelmenia Blase, vascular surgery. There is nothing further to do from a vascular perspective. She has adequate blood flow for healing. She  missed her appointment with Dr. Doreatha Lew her surgeon yesterday. She completed her course of Bactrim that I prescribed. She reports minimal drainage from the wound bed. She reports improvement in wound healing. She is using Hydrofera Blue to the wound beds. She states that home health has discharged her. She denies signs of infection. 4/5; patient presents for follow-up. She has been using Hydrofera Blue daily to the wound beds. She denies signs of infection. She has no issues or complaints today. 4/19; patient presents for follow-up. She has been using Hydrofera Blue daily to the wound beds. A small area on the medial aspect of the foot had blistered up and opened. Currently she denies  signs of infection. 4/26; patient presents for follow-up. She has been using Hydrofera Blue with no issues. The medial aspect of the foot that had blistered up and opened at previous clinic visit has healed. She denies signs of infection. 5/3; Patient presents for follow-up. She has been trying to use Hydrofera Blue to the wound bed however this is not staying in place very well. Unfortunately she has developed another wound to the right anterior lower extremity. It was previously a blister. There is still devitalized tissue present. She currently denies signs of infection. 5/10; patient presents for follow-up. She states that the compression wrap Slipped down her leg after it was placed last week.. She reports using gentamicin ointment to the foot wound. She currently denies signs of infection. 5/17; patient presents for follow-up. She states that the compression wrap did not stay in place. She reports she had dried skin to the previous wound site on the right bottom foot for which she removed. She states now she has a wound there. She currently denies signs of infection. Objective Constitutional Vitals Time Taken: 3:10 PM, Temperature: 97.8 F, Pulse: 87 bpm, Respiratory Rate: 18 breaths/min, Blood Pressure:  122/55 mmHg. General Notes: Right foot: The great toe amputation site has epithelialized . To the plantar aspect on the medial side there is an open wound with 0.3 cm of depth. no surrounding signs of infection. Right lower extremity: To the anterior aspect there Is an open wound with granulation tissue and nonviable tissue. No surrounding signs of infection. Integumentary (Hair, Skin) Wound #10RR status is Open. Original cause of wound was Surgical Injury. The date acquired was: 08/05/2021. The wound has been in treatment 13 weeks. The wound is located on the [place]. The wound measures 0.3cm length x 1.7cm width x 0.2cm depth; 0.401cm^2 area and 0.08cm^3 volume. There is Fat Layer (Subcutaneous Tissue) exposed. There is no tunneling or undermining noted. There is a medium amount of serosanguineous drainage noted. There is small (1-33%) red, pink granulation within the wound bed. There is a large (67-100%) amount of necrotic tissue within the wound bed including Adherent Slough. Wound #11 status is Open. Original cause of wound was Blister. The date acquired was: 07/20/2021. The wound has been in treatment 2 weeks. The wound is located on the Right,Medial Lower Leg. The wound measures 6.1cm length x 10cm width x 0.1cm depth; 47.909cm^2 area and 4.791cm^3 volume. There is Fat Layer (Subcutaneous Tissue) exposed. There is no tunneling or undermining noted. There is a medium amount of serosanguineous drainage noted. There is large (67-100%) red granulation within the wound bed. There is a small (1-33%) amount of necrotic tissue within the wound bed including Adherent Slough. Wound #9 status is Healed - Epithelialized. Original cause of wound was Surgical Injury. The date acquired was: 03/13/2021. The wound has been in treatment 13 weeks. The wound is located on the [place]. The wound measures 0cm length x 0cm width x 0cm depth; 0cm^2 area and 0cm^3 volume. There is no tunneling or  undermining noted. There is a none present amount of drainage noted. There is no granulation within the wound bed. There is no necrotic tissue within the wound bed. Assessment Active Problems ICD-10 Type 2 diabetes mellitus with foot ulcer Kerstein, Naly ([id number]) Disruption of wound, unspecified, initial encounter Non-pressure chronic ulcer of other part of right foot with fat layer exposed Non-pressure chronic ulcer of other part of right lower leg limited to breakdown of skin Osteomyelitis, unspecified The original wound at  the amputation site to the right foot has healed. Unfortunately she has developed another wound to the right medial plantar aspect. I debrided nonviable tissue. I recommended gentamicin ointment daily to this area. The anterior right lower extremity wound has shown improvement in appearance since last clinic visit. There is more epithelization. Since the wrap is not staying in place I recommended antibiotic ointment daily to this area and keeping it covered. Follow-up in 1 week. Procedures Wound #10RR Pre-procedure diagnosis of Wound #10RR is a Diabetic Wound/Ulcer of the Lower Extremity located on the Right,Plantar Foot .Severity of Tissue Pre Debridement is: Fat layer exposed. There was a Excisional Skin/Subcutaneous Tissue Debridement with a total area of 0.51 sq cm performed by Kalman Shan, MD. With the following instrument(s): Curette to remove Viable and Non-Viable tissue/material. Material removed includes Subcutaneous Tissue and Slough and after achieving pain control using Lidocaine 4% Topical Solution. No specimens were taken. A time out was conducted at 15:43, prior to the start of the procedure. A Moderate amount of bleeding was controlled with Pressure. The procedure was tolerated well. Post Debridement Measurements: 0.3cm length x 1.7cm width x 0.3cm depth; 0.12cm^3 volume. Character of Wound/Ulcer Post Debridement is stable. Severity of Tissue Post  Debridement is: Fat layer exposed. Post procedure Diagnosis Wound #10RR: Same as Pre-Procedure Wound #11 Pre-procedure diagnosis of Wound #11 is a Diabetic Wound/Ulcer of the Lower Extremity located on the Right,Medial Lower Leg .Severity of Tissue Pre Debridement is: Fat layer exposed. There was a Excisional Skin/Subcutaneous Tissue Debridement with a total area of 61 sq cm performed by Kalman Shan, MD. With the following instrument(s): Curette to remove Viable and Non-Viable tissue/material. Material removed includes Subcutaneous Tissue and Slough and. No specimens were taken. A time out was conducted at 15:45, prior to the start of the procedure. A Minimum amount of bleeding was controlled with Pressure. The procedure was tolerated well. Post Debridement Measurements: 6.1cm length x 10cm width x 0.1cm depth; 4.791cm^3 volume. Character of Wound/Ulcer Post Debridement is stable. Severity of Tissue Post Debridement is: Fat layer exposed. Post procedure Diagnosis Wound #11: Same as Pre-Procedure Plan Follow-up Appointments: Return Appointment in 1 week. Nurse Visit as needed Other: - [facility] has patient on list to call back and set up appointment to be seen Bathing/ Shower/ Hygiene: Clean wound with Normal Saline or wound cleanser. Wash wounds with antibacterial soap and water. May shower with wound dressing protected with water repellent cover or cast protector. No tub bath. Edema Control - Lymphedema / Segmental Compressive Device / Other: Elevate, Exercise Daily and Avoid Standing for Long Periods of Time. Elevate leg(s) parallel to the floor when sitting. DO YOUR BEST to sleep in the bed at night. DO NOT sleep in your recliner. Long hours of sitting in a recliner leads to swelling of the legs and/or potential wounds on your backside. WOUND #10RR: - Foot Wound Laterality: Plantar, Right Cleanser: Normal Saline 1 x Per Day/30 Days Discharge Instructions: Wash your hands with  soap and water. Remove old dressing, discard into plastic bag and place into trash. Cleanse the wound with Normal Saline prior to applying a clean dressing using gauze sponges, not tissues or cotton balls. Do not scrub or use excessive force. Pat dry using gauze sponges, not tissue or cotton balls. Cleanser: Soap and Water 1 x Per Day/30 Days Discharge Instructions: Gently cleanse wound with antibacterial soap, rinse and pat dry prior to dressing wounds Topical: Gentamicin 1 x Per Day/30 Days Discharge Instructions: Apply as  directed by provider. Primary Dressing: (BORDER) Zetuvit Plus Silicone Border Dressing 4x4 (in/in) (DME) (Generic) 1 x Per Day/30 Days WOUND #11: - Lower Leg Wound Laterality: Right, Medial Cleanser: Normal Saline 1 x Per Day/30 Days Discharge Instructions: Wash your hands with soap and water. Remove old dressing, discard into plastic bag and place into trash. Cleanse the wound with Normal Saline prior to applying a clean dressing using gauze sponges, not tissues or cotton balls. Do not scrub or use excessive force. Pat dry using gauze sponges, not tissue or cotton balls. Cleanser: Soap and Water 1 x Per Day/30 Days Discharge Instructions: Gently cleanse wound with antibacterial soap, rinse and pat dry prior to dressing wounds Tarlton, Dennys ([id number]) Topical: Gentamicin 1 x Per Day/30 Days Discharge Instructions: Apply as directed by provider. Primary Dressing: (BORDER) Zetuvit Plus SILICONE BORDER Dressing 5x5 (in/in) (DME) (Generic) 1 x Per Day/30 Days 1. In office sharp debridement 2. Gentamicin ointment 3. Aggressive offloading -Surgical shoe 4. Follow-up in 1 week Electronic Signature(s) Signed: 08/06/2021 4:30:23 PM By: Kalman Shan DO Entered By: Kalman Shan on 08/06/2021 16:20:25 Croswell, [name] ([id number]) -------------------------------------------------------------------------------- SuperBill Details Patient Name: [name] Date of  Service: 08/06/2021 Medical Record Number: [id number] Patient Account Number: [id number] Date of Birth/Sex: Dec 11, 1965 (55 y.o. 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 M86.9 Osteomyelitis, unspecified Facility Procedures CPT4 Code: 44315400 Description: [place] VISIT-LEV 3 EST PT Modifier: Quantity: 1 CPT4 Code: 86761950 Description: 93267 - DEB SUBQ TISSUE 20 SQ CM/< Modifier: Quantity: 1 CPT4 Code: Description: ICD-10 Diagnosis Description L97.512 Non-pressure chronic ulcer of other part of right foot with fat layer expo Modifier: sed Quantity: CPT4 Code: 12458099 Description: 83382 - DEB SUBQ TISS EA ADDL 20CM Modifier: Quantity: 3 CPT4 Code: Description: ICD-10 Diagnosis Description L97.512 Non-pressure chronic ulcer of other part of right foot with fat layer expo L97.811 Non-pressure chronic ulcer of other part of right lower leg limited to bre Modifier: sed akdown of skin Quantity: Physician Procedures CPT4 Code: 5053976 Description: 11042 - WC PHYS SUBQ TISS 20 SQ CM Modifier: Quantity: 1 CPT4 Code: Description: ICD-10 Diagnosis Description L97.512 Non-pressure chronic ulcer of other part of right foot with fat layer expo Modifier: sed Quantity: CPT4 Code: 7341937 Description: 11045 - WC PHYS SUBQ TISS EA ADDL 20 CM Modifier: Quantity: 3 CPT4 Code: Description: ICD-10 Diagnosis Description L97.512 Non-pressure chronic ulcer of other part of right foot with fat layer expo  L97.811 Non-pressure chronic ulcer of other part of right lower leg limited to bre Modifier: sed akdown of skin Quantity: Electronic Signature(s) Signed: 08/06/2021 4:31:45 PM By: Levora Dredge Previous Signature: 08/06/2021 4:30:23 PM Version By: Kalman Shan DO Entered By: Levora Dredge on 08/06/2021 16:31:45

## 2021-08-09 NOTE — Progress Notes (Signed)
DESHAWN, SKELLEY ([id number]) Visit Report for 08/06/2021 Arrival Information Details Patient Name: LEDORA, DELKER Date of Service: 08/06/2021 3:00 PM Medical Record Number: [id number] Patient Account Number: [id number] Date of Birth/Sex: 1965-09-29 (56 y.o. F) Treating RN: Alycia Rossetti Primary Care [name] Bonny: Laurance Flatten Other Clinician: Referring Shanta Hartner: Laurance Flatten Treating Roselynne Lortz/Extender: Yaakov Guthrie in Treatment: 26 Visit Information History Since Last Visit Added or deleted any medications: No Patient Arrived: [name] Any new allergies or adverse reactions: No Arrival Time: 15:09 Had a fall or experienced change in No Accompanied By: self activities of daily living that may affect Transfer Assistance: None risk of falls: Patient Identification Verified: Yes Hospitalized since last visit: No Secondary Verification Process Completed: Yes Has Dressing in Place as Prescribed: Yes Patient Requires Transmission-Based Precautions: No Has Compression in Place as Prescribed: Yes Patient Has Alerts: Yes Pain Present Now: Yes Patient Alerts: diabetic Electronic Signature(s) Signed: 08/08/2021 8:14:28 AM By: Alycia Rossetti Entered By: Alycia Rossetti on 08/06/2021 15:10:02 Garant, Cattaleya ([id number]) -------------------------------------------------------------------------------- Clinic Level of Care Assessment Details Patient Name: [name] Date of Service: 08/06/2021 3:00 PM Medical Record Number: [id number] Patient Account Number: [id number] Date of Birth/Sex: [birth year] (56 y.o. F) Treating RN: Levora Dredge Primary Care Keilon Ressel: Laurance Flatten Other Clinician: Referring Belford Pascucci: Laurance Flatten Treating Tabias Swayze/Extender: Yaakov Guthrie in Treatment: 13 Clinic Level of Care Assessment Items TOOL 4 Quantity Score '[]'$  - Use when only an EandM is performed on FOLLOW-UP visit 0 ASSESSMENTS - Nursing Assessment / Reassessment '[]'$  - Reassessment of Co-morbidities  (includes updates in patient status) 0 '[]'$  - 0 Reassessment of Adherence to Treatment Plan ASSESSMENTS - Wound and Skin Assessment / Reassessment '[]'$  - Simple Wound Assessment / Reassessment - one wound 0 '[]'$  - 0 Complex Wound Assessment / Reassessment - multiple wounds '[]'$  - 0 Dermatologic / Skin Assessment (not related to wound area) ASSESSMENTS - Focused Assessment '[]'$  - Circumferential Edema Measurements - multi extremities 0 '[]'$  - 0 Nutritional Assessment / Counseling / Intervention '[]'$  - 0 Lower Extremity Assessment (monofilament, tuning fork, pulses) '[]'$  - 0 Peripheral Arterial Disease Assessment (using hand held doppler) ASSESSMENTS - Ostomy and/or Continence Assessment and Care '[]'$  - Incontinence Assessment and Management 0 '[]'$  - 0 Ostomy Care Assessment and Management (repouching, etc.) PROCESS - Coordination of Care '[]'$  - Simple Patient / Family Education for ongoing care 0 '[]'$  - 0 Complex (extensive) Patient / Family Education for ongoing care '[]'$  - 0 Staff obtains Programmer, systems, Records, Test Results / Process Orders '[]'$  - 0 Staff telephones HHA, Nursing Homes / Clarify orders / etc '[]'$  - 0 Routine Transfer to another Facility (non-emergent condition) '[]'$  - 0 Routine Hospital Admission (non-emergent condition) '[]'$  - 0 New Admissions / Biomedical engineer / Ordering NPWT, Apligraf, etc. '[]'$  - 0 Emergency Hospital Admission (emergent condition) '[]'$  - 0 Simple Discharge Coordination '[]'$  - 0 Complex (extensive) Discharge Coordination PROCESS - Special Needs '[]'$  - Pediatric / Minor Patient Management 0 '[]'$  - 0 Isolation Patient Management '[]'$  - 0 Hearing / Language / Visual special needs '[]'$  - 0 Assessment of Community assistance (transportation, D/C planning, etc.) '[]'$  - 0 Additional assistance / Altered mentation '[]'$  - 0 Support Surface(s) Assessment (bed, cushion, seat, etc.) INTERVENTIONS - Wound Cleansing / Measurement Shenberger, Vaunda ([id number]) '[]'$  - 0 Simple Wound  Cleansing - one wound '[]'$  - 0 Complex Wound Cleansing - multiple wounds '[]'$  - 0 Wound Imaging (photographs - any number of wounds) '[]'$  - 0 Wound Tracing (instead of photographs) '[]'$  - 0 Simple Wound Measurement -  one wound '[]'$  - 0 Complex Wound Measurement - multiple wounds INTERVENTIONS - Wound Dressings '[]'$  - Small Wound Dressing one or multiple wounds 0 '[]'$  - 0 Medium Wound Dressing one or multiple wounds '[]'$  - 0 Large Wound Dressing one or multiple wounds '[]'$  - 0 Application of Medications - topical '[]'$  - 0 Application of Medications - injection INTERVENTIONS - Miscellaneous '[]'$  - External ear exam 0 '[]'$  - 0 Specimen Collection (cultures, biopsies, blood, body fluids, etc.) '[]'$  - 0 Specimen(s) / Culture(s) sent or taken to Lab for analysis '[]'$  - 0 Patient Transfer (multiple staff / Civil Service fast streamer / Similar devices) '[]'$  - 0 Simple Staple / Suture removal (25 or less) '[]'$  - 0 Complex Staple / Suture removal (26 or more) '[]'$  - 0 Hypo / Hyperglycemic Management (close monitor of Blood Glucose) '[]'$  - 0 Ankle / Brachial Index (ABI) - do not check if billed separately '[]'$  - 0 Vital Signs Has the patient been seen at the hospital within the last three years: Yes Total Score: 0 Level Of Care: ____ Electronic Signature(s) Signed: 08/08/2021 1:57:43 PM By: Levora Dredge Previous Signature: 08/06/2021 4:38:45 PM Version By: Levora Dredge Entered By: Levora Dredge on 08/08/2021 08:10:08 Ackley, [name] ([id number]) -------------------------------------------------------------------------------- Encounter Discharge Information Details Patient Name: [name] Date of Service: 08/06/2021 3:00 PM Medical Record Number: [id number] Patient Account Number: [id number] Date of Birth/Sex: 1966/01/17 (56 y.o. F) Treating RN: Levora Dredge Primary Care Tenya Araque: Laurance Flatten Other Clinician: Referring Shahla Betsill: Laurance Flatten Treating Shuaib Corsino/Extender: Yaakov Guthrie in Treatment:  13 Encounter Discharge Information Items Post Procedure Vitals Discharge Condition: Stable Temperature (F): 97.4 Ambulatory Status: Cane Pulse (bpm): 87 Discharge Destination: Home Respiratory Rate (breaths/min): 18 Transportation: Private Auto Blood Pressure (mmHg): 122/55 Accompanied By: daughter Schedule Follow-up Appointment: Yes Clinical Summary of Care: Patient Declined Electronic Signature(s) Signed: 08/06/2021 4:32:56 PM By: Levora Dredge Entered By: Levora Dredge on 08/06/2021 16:32:56 Martis, [name] ([id number]) -------------------------------------------------------------------------------- Lower Extremity Assessment Details Patient Name: [name] Date of Service: 08/06/2021 3:00 PM Medical Record Number: [id number] Patient Account Number: [id number] Date of Birth/Sex: Sep 27, 1965 (55 y.o. F) Treating RN: Alycia Rossetti Primary Care Vasti Yagi: Laurance Flatten Other Clinician: Referring Caili Escalera: Laurance Flatten Treating Wenda Vanschaick/Extender: Yaakov Guthrie in Treatment: 13 Edema Assessment Assessed: [Left: No] Patrice Paradise: No] Edema: [Left: Ye] [Right: s] Calf Left: Right: Point of Measurement: 35 cm From Medial Instep 48.5 cm Ankle Left: Right: Point of Measurement: 10 cm From Medial Instep 24 cm Electronic Signature(s) Signed: 08/08/2021 8:14:28 AM By: Alycia Rossetti Entered By: Alycia Rossetti on 08/06/2021 15:32:14 Berhe, Courtnei ([id number]) -------------------------------------------------------------------------------- Multi Wound Chart Details Patient Name: [name] Date of Service: 08/06/2021 3:00 PM Medical Record Number: [id number] Patient Account Number: [id number] Date of Birth/Sex: 01-02-1966 (55 y.o. 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 of the Lower Open Surgical Wound Extremity Extremity Comorbid History: Sleep Apnea, Congestive Heart Sleep Apnea, Congestive Heart Sleep Apnea, Congestive Heart Failure, Coronary Artery Disease, Failure, Coronary Artery Disease, Failure, Coronary Artery Disease, Hypertension, Myocardial Infarction, Hypertension, Myocardial Infarction, Hypertension, Myocardial Infarction, Peripheral Venous Disease, Type II Peripheral Venous Disease, Type II Peripheral Venous Disease, Type II Diabetes, End Stage  Renal Disease, Diabetes, End Stage Renal Disease, Diabetes, End Stage Renal Disease, Gout, Neuropathy Gout, Neuropathy Gout, Neuropathy Date Acquired: 08/05/2021 07/20/2021 03/13/2021 Weeks of Treatment: '13 2 13 '$ Wound Status: Open Open Healed - Epithelialized Wound Recurrence: Yes No No Clustered Wound: Yes No No Clustered Quantity: 2 N/A N/A Pending Amputation on Yes No Yes Presentation: Measurements L x W x D (cm) 0.3x1.7x0.2 6.1x10x0.1 0x0x0 Area (cm) : 0.401 47.909 0 Volume (cm) : 0.08 4.791 0 % Reduction in Area: 88.90% -19.60% 100.00% % Reduction in Volume: 77.80% -19.60% 100.00% Classification: Grade 2 Grade 1 Full Thickness Without Exposed Support Structures Exudate Amount: Medium Medium None Present Exudate Type: Serosanguineous Serosanguineous N/A Exudate Color: red, brown red, brown N/A Granulation Amount: Small (1-33%) Large (67-100%) None Present (0%) Granulation Quality: Red, Pink Red N/A Necrotic Amount: Large (67-100%) Small (1-33%) None Present (0%) Exposed Structures: Fat Layer (Subcutaneous Tissue): Fat Layer (Subcutaneous Tissue): Fat Layer (Subcutaneous  Tissue): Yes Yes No Fascia: No Tendon: No Muscle: No Joint: No Bone: No Epithelialization: Small (1-33%) Small (1-33%) Large (67-100%) Debridement: Debridement - Excisional Debridement - Excisional N/A Pre-procedure Verification/Time 15:43 15:45 N/A Out Taken: Pain Control: Lidocaine 4% Topical Solution N/A N/A Whitsell, Creedence ([id number]) Tissue Debrided: Subcutaneous, Slough Subcutaneous, Slough N/A Level: Skin/Subcutaneous Tissue Skin/Subcutaneous Tissue N/A Debridement Area (sq cm): 0.51 61 N/A Instrument: Curette Curette N/A Bleeding: Moderate Minimum N/A Hemostasis Achieved: Pressure Pressure N/A Debridement Treatment Procedure was tolerated well Procedure was tolerated well N/A Response: Post Debridement 0.3x1.7x0.3 6.1x10x0.1 N/A Measurements L x W x D (cm) Post Debridement Volume: 0.12 4.791 N/A (cm) Procedures Performed: Debridement Debridement N/A Treatment Notes Electronic Signature(s) Signed: 08/06/2021 4:30:34 PM By: Levora Dredge Entered By: Levora Dredge on 08/06/2021 16:30:34 Lacina, [name] ([id number]) -------------------------------------------------------------------------------- Multi-Disciplinary Care Plan Details Patient Name: [name] Date of Service: 08/06/2021 3:00 PM Medical Record Number: [id number] Patient Account Number: [id number] Date of Birth/Sex: [birth year] (55 y.o. F) Treating RN: Levora Dredge Primary Care Vijay Durflinger: Laurance Flatten Other Clinician: Referring Siana Panameno: Laurance Flatten Treating Caoimhe Damron/Extender: Yaakov Guthrie in Treatment: 13 Active Inactive Wound/Skin Impairment Nursing Diagnoses: Impaired tissue integrity Knowledge deficit related to ulceration/compromised skin integrity Goals: Ulcer/skin breakdown will have a volume reduction of 30% by week 4 Date Initiated: 05/07/2021 Target Resolution Date: 06/04/2021 Goal Status: Active Ulcer/skin breakdown will have a volume reduction of 50% by week 8 Date  Initiated: 05/07/2021 Target Resolution Date: 07/02/2021 Goal Status: Active Ulcer/skin breakdown will have a volume reduction of 80% by week 12 Date Initiated: 05/07/2021 Target Resolution Date: 07/30/2021 Goal Status: Active Ulcer/skin breakdown will heal within 14 weeks Date Initiated: 05/07/2021 Target Resolution Date: 08/13/2021 Goal Status: Active Interventions: Assess patient/caregiver ability to obtain necessary supplies Assess patient/caregiver ability to perform ulcer/skin care regimen upon admission and as needed Assess ulceration(s) every visit Provide education on ulcer and skin care Notes: Electronic Signature(s) Signed: 08/06/2021 4:30:21 PM By: Levora Dredge Entered By: Levora Dredge on 08/06/2021 16:30:21 [place], [place] ([id number]) -------------------------------------------------------------------------------- Pain Assessment Details Patient Name: [name] Date of Service: 08/06/2021 3:00 PM Medical Record Number: [id number] Patient Account Number: [id number] Date of Birth/Sex: May 20, 1965 (56 y.o. F) Treating RN: Alycia Rossetti Primary Care Manda Holstad: Laurance Flatten Other Clinician: Referring Shandon Burlingame: Laurance Flatten Treating Nakari Bracknell/Extender: Yaakov Guthrie in Treatment: 13 Active Problems Location of Pain Severity and Description of Pain Patient Has Paino Yes Site Locations Rate the pain. Current Pain Level: 10 Pain Management and Medication Current Pain Management: Notes pt states pain at wound site Electronic Signature(s) Signed: 08/08/2021 8:14:28 AM By:  Alycia Rossetti Entered By: Alycia Rossetti on 08/06/2021 15:11:31 [place], [name] ([id number]) -------------------------------------------------------------------------------- Patient/Caregiver Education Details Patient Name: [name] Date of Service: 08/06/2021 3:00 PM Medical Record Number: [id number] Patient Account Number: [id number] Date of Birth/Gender: 1965/06/19 (56 y.o. F) Treating RN:  Levora Dredge Primary Care Physician: Laurance Flatten Other Clinician: Referring Physician: Laurance Flatten Treating Physician/Extender: Yaakov Guthrie in Treatment: 13 Education Assessment Education Provided To: Patient Education Topics Provided Wound Debridement: Handouts: Wound Debridement Methods: Explain/Verbal Responses: State content correctly Wound/Skin Impairment: Handouts: Caring for Your Ulcer Methods: Explain/Verbal Responses: State content correctly Electronic Signature(s) Signed: 08/06/2021 4:38:45 PM By: Levora Dredge Entered By: Levora Dredge on 08/06/2021 16:31:58 Sturgeon, [place] ([id number]) -------------------------------------------------------------------------------- Wound Assessment Details Patient Name: [name] Date of Service: 08/06/2021 3:00 PM Medical Record Number: [id number] Patient Account Number: [id number] Date of Birth/Sex: 1965/05/07 (55 y.o. F) Treating RN: Alycia Rossetti Primary Care Kataleya Zaugg: Laurance Flatten Other Clinician: Referring Norvel Wenker: Laurance Flatten Treating Nakaiya Beddow/Extender: Yaakov Guthrie in Treatment: 13 Wound Status Wound Number: [id number] Primary Diabetic Wound/Ulcer of the Lower Extremity Etiology: Wound Location: Right, Plantar Foot Wound Open Wounding Event: Surgical Injury Status: Date Acquired: 08/05/2021 Comorbid Sleep Apnea, Congestive Heart Failure, Coronary Artery Weeks Of Treatment: 13 History: Disease, Hypertension, Myocardial Infarction, Peripheral Clustered Wound: Yes Venous Disease, Type II Diabetes, End Stage Renal Pending Amputation On Presentation Disease, Gout, Neuropathy Photos Wound Measurements Length: (cm) 0.3 Width: (cm) 1.7 Depth: (cm) 0.2 Clustered Quantity: 2 Area: (cm) 0.401 Volume: (cm) 0.08 % Reduction in Area: 88.9% % Reduction in Volume: 77.8% Epithelialization: Small (1-33%) Tunneling: No Undermining: No Wound Description Classification: Grade 2 Exudate Amount:  Medium Exudate Type: Serosanguineous Exudate Color: red, brown Foul Odor After Cleansing: No Slough/Fibrino Yes Wound Bed Granulation Amount: Small (1-33%) Exposed Structure Granulation Quality: Red, Pink Fascia Exposed: No Necrotic Amount: Large (67-100%) Fat Layer (Subcutaneous Tissue) Exposed: Yes Necrotic Quality: Adherent Slough Tendon Exposed: No Muscle Exposed: No Joint Exposed: No Bone Exposed: No Treatment Notes Wound #10RR (Foot) Wound Laterality: Plantar, Right Cleanser Normal Saline Discharge Instruction: Wash your hands with soap and water. Remove old dressing, discard into plastic bag and place into trash. Cleanse the wound with Normal Saline prior to applying a clean dressing using gauze sponges, not tissues or cotton balls. Do not Mcclaine, Nickola ([id number]) scrub or use excessive force. Pat dry using gauze sponges, not tissue or cotton balls. Soap and Water Discharge Instruction: Gently cleanse wound with antibacterial soap, rinse and pat dry prior to dressing wounds Peri-Wound Care Topical Gentamicin Discharge Instruction: Apply as directed by Brantlee Penn. Primary Dressing (BORDER) Zetuvit Plus Silicone Border Dressing 4x4 (in/in) Secondary Dressing Secured With Compression Wrap Compression Stockings Add-Ons Electronic Signature(s) Signed: 08/08/2021 8:14:28 AM By: Alycia Rossetti Entered By: Alycia Rossetti on 08/06/2021 15:31:46 Pearman, Luellen ([id number]) -------------------------------------------------------------------------------- Wound Assessment Details Patient Name: [name] Date of Service: 08/06/2021 3:00 PM Medical Record Number: [id number] Patient Account Number: [id number] Date of Birth/Sex: 1965/06/29 (56 y.o. F) Treating RN: Alycia Rossetti Primary Care Kyasia Steuck: Laurance Flatten Other Clinician: Referring Tacuma Graffam: Laurance Flatten Treating Jorgeluis Gurganus/Extender: Yaakov Guthrie in Treatment: 13 Wound Status Wound Number: 11 Primary Diabetic  Wound/Ulcer of the Lower Extremity Etiology: Wound Location: Right, Medial Lower Leg Wound Open Wounding Event: Blister Status: Date Acquired: 07/20/2021 Comorbid Sleep Apnea, Congestive Heart Failure, Coronary Artery Weeks Of Treatment: 2 History: Disease, Hypertension, Myocardial Infarction, Peripheral Clustered Wound: No Venous Disease, Type II Diabetes, End Stage Renal Disease, Gout, Neuropathy Photos Wound Measurements Length: (cm) 6.1 Width: (cm) 10 Depth: (cm) 0.1  Area: (cm) 47.909 Volume: (cm) 4.791 % Reduction in Area: -19.6% % Reduction in Volume: -19.6% Epithelialization: Small (1-33%) Tunneling: No Undermining: No Wound Description Classification: Grade 1 Exudate Amount: Medium Exudate Type: Serosanguineous Exudate Color: red, brown Foul Odor After Cleansing: No Slough/Fibrino Yes Wound Bed Granulation Amount: Large (67-100%) Exposed Structure Granulation Quality: Red Fat Layer (Subcutaneous Tissue) Exposed: Yes Necrotic Amount: Small (1-33%) Necrotic Quality: Adherent Slough Treatment Notes Wound #11 (Lower Leg) Wound Laterality: Right, Medial Cleanser Normal Saline Discharge Instruction: Wash your hands with soap and water. Remove old dressing, discard into plastic bag and place into trash. Cleanse the wound with Normal Saline prior to applying a clean dressing using gauze sponges, not tissues or cotton balls. Do not scrub or use excessive force. Pat dry using gauze sponges, not tissue or cotton balls. Soap and Water Discharge Instruction: Gently cleanse wound with antibacterial soap, rinse and pat dry prior to dressing wounds Fleissner, Myya ([id number]) Peri-Wound Care Topical Gentamicin Discharge Instruction: Apply as directed by Ladarion Munyon. Primary Dressing (BORDER) Zetuvit Plus SILICONE BORDER Dressing 5x5 (in/in) Secondary Dressing Secured With Compression Wrap Compression Stockings Add-Ons Electronic Signature(s) Signed: 08/08/2021 8:14:28  AM By: Alycia Rossetti Entered By: Alycia Rossetti on 08/06/2021 15:28:21 Asato, Jacquita ([id number]) -------------------------------------------------------------------------------- Wound Assessment Details Patient Name: [name] Date of Service: 08/06/2021 3:00 PM Medical Record Number: [id number] Patient Account Number: [id number] Date of Birth/Sex: 01-23-1966 (56 y.o. F) Treating RN: Alycia Rossetti Primary Care Frederika Hukill: Laurance Flatten Other Clinician: Referring Caedence Snowden: Laurance Flatten Treating Aliese Brannum/Extender: Yaakov Guthrie in Treatment: 13 Wound Status Wound Number: 9 Primary Open Surgical Wound Etiology: Wound Location: Right, Midline Foot Wound Healed - Epithelialized Wounding Event: Surgical Injury Status: Date Acquired: 03/13/2021 Notes: amputation of right great toe Weeks Of Treatment: 13 Comorbid Sleep Apnea, Congestive Heart Failure, Coronary Artery Clustered Wound: No History: Disease, Hypertension, Myocardial Infarction, Peripheral Pending Amputation On Presentation Venous Disease, Type II Diabetes, End Stage Renal Disease, Gout, Neuropathy Photos Wound Measurements Length: (cm) 0 Width: (cm) 0 Depth: (cm) 0 Area: (cm) 0 Volume: (cm) 0 % Reduction in Area: 100% % Reduction in Volume: 100% Epithelialization: Large (67-100%) Tunneling: No Undermining: No Wound Description Classification: Full Thickness Without Exposed Support Structures Exudate Amount: None Present Foul Odor After Cleansing: No Slough/Fibrino No Wound Bed Granulation Amount: None Present (0%) Exposed Structure Necrotic Amount: None Present (0%) Fat Layer (Subcutaneous Tissue) Exposed: No Treatment Notes Wound #9 (Foot) Wound Laterality: Right, Midline Cleanser Peri-Wound Care Topical Primary Dressing Secondary Dressing Secured With Gladu, Veva ([id number]) Compression Wrap Compression Stockings Add-Ons Electronic Signature(s) Signed: 08/08/2021 8:14:28 AM By: Alycia Rossetti Entered By: Alycia Rossetti on 08/06/2021 15:54:56 Lingo, Kristine ([id number]) -------------------------------------------------------------------------------- Vitals Details Patient Name: [name] Date of Service: 08/06/2021 3:00 PM Medical Record Number: [id number] Patient Account Number: [id number] Date of Birth/Sex: 17-Apr-1965 (56 y.o. F) Treating RN: Alycia Rossetti Primary Care Celinda Dethlefs: Laurance Flatten Other Clinician: Referring Londell Noll: Laurance Flatten Treating Saryiah Bencosme/Extender: Yaakov Guthrie in Treatment: 13 Vital Signs Time Taken: 15:10 Temperature (F): 97.8 Pulse (bpm): 87 Respiratory Rate (breaths/min): 18 Blood Pressure (mmHg): 122/55 Reference Range: 80 - 120 mg / dl Electronic Signature(s) Signed: 08/08/2021 8:14:28 AM By: Alycia Rossetti Entered By: Alycia Rossetti on 08/06/2021 15:11:07

## 2021-08-13 ENCOUNTER — Encounter (HOSPITAL_BASED_OUTPATIENT_CLINIC_OR_DEPARTMENT_OTHER): Payer: Medicare Other | Admitting: Internal Medicine

## 2021-08-13 DIAGNOSIS — L97512 Non-pressure chronic ulcer of other part of right foot with fat layer exposed: Secondary | ICD-10-CM

## 2021-08-13 DIAGNOSIS — M869 Osteomyelitis, unspecified: Secondary | ICD-10-CM

## 2021-08-13 DIAGNOSIS — L97811 Non-pressure chronic ulcer of other part of right lower leg limited to breakdown of skin: Secondary | ICD-10-CM

## 2021-08-13 DIAGNOSIS — E11621 Type 2 diabetes mellitus with foot ulcer: Secondary | ICD-10-CM | POA: Diagnosis not present

## 2021-08-13 NOTE — Progress Notes (Addendum)
[name], [name] ([id number]) Visit Report for 08/13/2021 Chief Complaint Document Details Patient Name: [name], [name] Date of Service: 08/13/2021 2:15 PM Medical Record Number: [id number] Patient Account Number: [id number] Date of Birth/Sex: [birth year] (56 y.o. F) Treating RN: Cornell Barman Primary Care Provider: Laurance Flatten Other Clinician: Massie Kluver Referring Provider: Laurance Flatten Treating Provider/Extender: Yaakov Guthrie in Treatment: 14 Information Obtained from: Patient Chief Complaint 05/07/2021; surgical wound dehiscence to the right great toe amputation site. 07/23/2021; patient developed a wound to the anterior right lower extremity limited to skin breakdown from a blister. Electronic Signature(s) Signed: 08/13/2021 3:29:15 PM By: Kalman Shan DO Entered By: Kalman Shan on 08/13/2021 15:23:04 Parkinson, [name] ([id number]) -------------------------------------------------------------------------------- HPI Details Patient Name: [name] Date of Service: 08/13/2021 2:15 PM Medical Record Number: [id number] Patient Account Number: [id number] Date of Birth/Sex: 1965/05/02 (56 y.o. F) Treating RN: Cornell Barman Primary Care Provider: Laurance Flatten Other Clinician: Massie Kluver Referring Provider: Laurance Flatten Treating Provider/Extender: Yaakov Guthrie in Treatment: 14 History of Present Illness HPI Description: 10/24/2018 on evaluation today patient presents for initial evaluation or clinic concerning issues that she has been having with lymphedema for quite some time. Unfortunately she has several wound openings at this point that secondary to her lymphedema/venous stasis are giving her trouble and leaking quite severely. She also has diabetes along with hypertension and stage III chronic kidney disease. Fortunately there is no signs of active infection at this time. She is going to likely require debridement of the left leg ulcer upon evaluation today just  based on what I am seeing. Fortunately there is no wound opening on the right. Overall the patient seems to be doing quite well and again there is no evidence of systemic infection which is good news. No fevers, chills, nausea, vomiting, or diarrhea. 10/31/2018 on evaluation today patient actually appears to be doing excellent in regard to her lower extremity ulcers. She has been tolerating the dressing changes without complication. Fortunately there is no signs of active infection at this time. She has tolerated 3 layer compression wrap without complication. 11/07/2018 upon evaluation today patient actually appears to be doing very well with regard to her left lower extremity ulcers. She has been tolerating the dressing changes without complication. Fortunately there is no signs of active infection. No fevers, chills, nausea, vomiting, or diarrhea. 11/21/2018 upon evaluation today patient appears to be doing quite well with regard to her lower extremity ulcers. In fact both areas seem to be showing signs of good improvement which is excellent. She is not having as much pain as she has in the past and again has a lot of healing compared to previous visits as well. 12/05/2018 on evaluation today patient presents for follow-up concerning her ongoing issues with her bilateral lower extremity ulcers. The good news is her right lower extremity is showing signs of completely healing at this time which is great news. Fortunately there is no evidence of active infection. On the left she has just a very small area still remaining that is open at this time all in all she is very close to complete closure in my opinion. She does have compression stockings to wear at home. 12/12/2018 on evaluation today patient appears to be doing excellent in regard to her left lateral lower extremity ulcer. She has been tolerating the dressing changes without complication. Fortunately there is no signs of active infection at this  time. In fact this appears to be pretty much healed at this point although again she is  not 100% today. No fevers, chills, nausea, vomiting, or diarrhea. 12/19/2018 on evaluation today patient actually appears to be doing excellent with regard to her lower extremity ulcer in fact this appears to be completely healed today which is all some. She has done extremely well with wound care measures. No fevers, chills, nausea, vomiting, or diarrhea. ------------------------------------ 11/01/19-Readmission to the clinic Patient presents with left leg pain, wounds posterior calf, onset about 4 weeks, denies any fevers chills or shakes, has not been using anything to these wounds, was given compression stockings at last discharge from the clinic but has not been able to use them as they rolled down and cause creases Patient's history significant for type 2 diabetes insulin requiring, A1c of 6.9 lately, hypertension, chronic pain 8/18; patient readmitted that the clinic last week. She has wounds on her left lateral posterior lower leg all of this in close juxtaposition i.e. a localized site. We've been using silver alginate under 3 layer compression apparently the wound surface area is better. She was wearing compression stockings from [facility] therapy but says they were falling down. As she progresses more towards healing will need to address what we use in terms of compression stockings perhaps external compression garments 11/17/2019 on evaluation today patient appears to be doing well with regard to her legs currently. She does have 2 wounds which are actually measuring much better the more posterior is actually doing very well which I am pleased with the other though smaller is not quite a small but nonetheless on the lateral aspect does seem to be improving. 11/23/2019 on evaluation today patient appears to be doing well in regard to her wounds. In fact the wound on the posterior aspect of her leg appears  healed the lateral aspect is still open but extremely small. 9/15; this is a patient with chronic venous insufficiency and secondary lymphedema. She did not keep her clinic appointment last week and hence the left leg is a lot more swollen since she had to take the wrap off at some point. She has a small weeping area on the left lateral lower leg. She has probably a juxta lite stocking for the left leg I have asked her to bring that in when she comes to her clinic appointment next week at which time she should be healed 10/6; this is a patient with chronic venous insufficiency and secondary lymphedema left greater than right. Skin changes of chronic lymphedema especially in the left leg. She has not been here in 3 weeks. She took the wrap off 2 weeks ago. She has some very old 20/30 stockings from [facility] therapy in [place] she has been using. Fortunately her leg is closed. She also has a single [place] wrap for the left leg Readmission: 06/18/2020 upon evaluation today patient presents for reevaluation here in the clinic. She is a long-term patient that we have seen intermittently when she has had flareups of issues with her lower extremity secondary to chronic venous stasis. With that being said unfortunately today she is having a significant issue with a wound which is actually quite significant over the right lower extremity. She did see dermatology they put Vaseline followed by [place] dressings on her unfortunately she is continuing to have significant issues however here with pain and in fact this wrap was extremely stuck to her upon evaluation today. Nonetheless I believe that she is going to require a little bit different approach to try to keep things from sticking and hopefully aid in getting  this to heal more effectively and quickly. With that being said she does have a history of Pickerill, Logen ([id number]) chronic venous insufficiency, lymphedema, diabetes mellitus type 2, chronic  kidney disease stage III, hypertension, and congestive heart failure. This started as an injury in mid February and has worsened over the past 2 weeks. 07/04/2020 upon evaluation today patient appears to be doing well with regard to her wound on the leg. Overall I am extremely pleased with where things stand and I do think that she is making excellent progress. There is no sign of infection right now which is also great news. I think that she is close to complete resolution. 07/09/20 upon evaluation today patient appears to be doing excellent in regard to her leg ulcer. She has been tolerating the dressing changes without complication and in general I am extremely pleased with where things stand today. There does not appear to be any signs of active infection at this time which is great news. 07/25/2020 upon evaluation today patient appears to be doing well with regard to her wound. She has been tolerating the dressing changes without complication. Fortunately there is no signs of active infection at this time and her wound appears to be completely healed. Readmission: 09/30/2020 upon evaluation today patient appears for reevaluation here in our clinic. She was discharged May 5 that unfortunately has started to have weeping and wounds on the left leg at this point. She tells me the right leg did weep some but nothing as significant as the left. With all that being said at this point the patient states that she figured it was best to come in here and she was having a lot of drainage and it was get all of her sheets and everything. She has been wearing her Velcro wraps sometimes but it does not sound like she is been wearing them all the time which I think is part of the reason why this reopened. She also unfortunately had a fall which was quite significant. This occurred I believe about a week or 2 ago. Otherwise patient's past medical history really has not changed significantly. 10/07/2020 upon  evaluation today patient appears to be doing better in regard to her wounds of the lower extremities. Fortunately there does not appear to be any signs of active infection at this time which is great news. I do not see any evidence of anything worsening but again she continues to have significant bilateral stage III lymphedema. This has been an ongoing issue. We actually have seen her this year pretty much from March through May of 2022. Following this she continued with compression at home following but then unfortunately reopened again. That was on July 11. With that being said this is an ongoing and recurrent issue for which honestly have seen her over the past 2 years pretty much consistently. Nonetheless we have had Velcro compression wraps that she uses at home and despite this she continues to have frequent and recurrent ulcerations. 7/27; patient presents for 1 week follow-up. She has no issues or complaints today. She denies signs of infection. 10/21/2020 upon evaluation today patient's leg actually appears to be doing decently well on the left there is nothing open on the right at all and I am pleased in that regard. On the left side I do think that she still has some areas of weeping I believe the compression wrap is still the best way to go although I am hopeful that we are headed in the right  direction as far as getting this area to heal and silver. Upon inspection patient's wound bed actually showed signs of good granulation epithelization at this point. There does not appear to be any evidence of active infection which is great news and overall I am extremely pleased with where things stand. 11/04/2020 upon evaluation today patient appears to be doing well with regard to her left leg. In fact this is getting very close to complete granulation and epithelization at this point I am very pleased with where we stand. I do not see any signs of infection currently. 11/18/2020 upon evaluation  today patient appears to be doing well in regard to her wound. She is in fact doing extremely well considering the fact we did need to see her last week she was apparently sick and missed her appointment. She is therefore had the wrap on for 2 weeks. Fortunately there is no signs of active infection at this time. No fevers, chills, nausea, vomiting, or diarrhea. 11/28/2020 upon evaluation today patient appears to be doing well with regard to her legs. Things are healing nicely and overall I do not see any signs of infection at this time which is great news. No fevers, chills, nausea, vomiting, or diarrhea. With that being said she still has a couple areas that are open and weeping currently. 12/10/2020 upon evaluation today patient appears to be doing well with regard to her wound. She has been tolerating the dressing changes without complication. Fortunately there does not appear to be any signs of active infection at this time. No fevers, chills, nausea, vomiting, or diarrhea. 10/11; patient comes in today with really excellent edema control. Everything appears to be closed in clinic occluding the left lateral. The area on the right anterior has some eschar on the top I did not remove this. She has dry scaly skin which might benefit from ammonium lactate but for now I just discharged her with a moisturizer. The patient is also apparently fractured her right great toe. We gave her some stockinette to wear under her juxta lite on that side Readmission 05/07/2021 Ms. 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 There was a revision of the site on 03/13/2021. She has been using gauze to the wound site.She reports pain to the  medial aspect with drainage. Home health was changing the dressing however wound care visits have expired. She is trying to do the dressing change on her own but reports having trouble with this. She currently denies fever/chills. 2/22; patient presents for follow-up. She reports taking Augmentin for the past week. She reports heartburn from this and would like the solution version. She is on doxycycline prescribed by another provider for the next few months. She currently denies systemic signs of infection. She is scheduled for her MRI On 2/27. She has home health and is changing the dressing with Hydrofera Blue. She is unable to do this daily. She has a family member present to help watch how to do the dressing. 3/1; patient presents for follow-up. She was able to obtain the solution for Augmentin from her pharmacy however there was a short supply and this is on backorder currently. She continues to take doxycycline as prescribed. She reports improvement in her  foot pain. She had her MRI completed. She states she has not scheduled an appointment with vein and vascular. She is doing [employer] with dressing changes. She denies systemic signs of infection. 3/8; patient presents for follow-up. She reports improvement in wound healing. She has been using Hydrofera Blue. She has not been able to get in contact with vein and vascular to schedule an appointment to see her vascular surgeon. She states she has an appointment with her orthopedic surgeon at the end of the month. [name], [name] ([id number]) 3/29; patient has missed her last follow-up schedule two weeks ago. Since then she has been evaluated by Dr. Wilhelmenia Blase, vascular surgery. There is nothing further to do from a vascular perspective. She has adequate blood flow for healing. She missed her appointment with Dr. Doreatha Lew her surgeon yesterday. She completed her course of Bactrim that I prescribed. She reports minimal drainage from the wound bed.  She reports improvement in wound healing. She is using Hydrofera Blue to the wound beds. She states that home health has discharged her. She denies signs of infection. 4/5; patient presents for follow-up. She has been using Hydrofera Blue daily to the wound beds. She denies signs of infection. She has no issues or complaints today. 4/19; patient presents for follow-up. She has been using Hydrofera Blue daily to the wound beds. A small area on the medial aspect of the foot had blistered up and opened. Currently she denies signs of infection. 4/26; patient presents for follow-up. She has been using Hydrofera Blue with no issues. The medial aspect of the foot that had blistered up and opened at previous clinic visit has healed. She denies signs of infection. 5/3; Patient presents for follow-up. She has been trying to use Hydrofera Blue to the wound bed however this is not staying in place very well. Unfortunately she has developed another wound to the right anterior lower extremity. It was previously a blister. There is still devitalized tissue present. She currently denies signs of infection. 5/10; patient presents for follow-up. She states that the compression wrap Slipped down her leg after it was placed last week.. She reports using gentamicin ointment to the foot wound. She currently denies signs of infection. 5/17; patient presents for follow-up. She states that the compression wrap did not stay in place. She reports she had dried skin to the previous wound site on the right bottom foot for which she removed. She states now she has a wound there. She currently denies signs of infection. 5/24; patient presents for follow-up. She has been using gentamicin ointment to the wound beds. She reports improvement in wound healing. She has no issues or complaints today. She denies signs of infection. Electronic Signature(s) Signed: 08/13/2021 3:29:15 PM By: Kalman Shan DO Entered By: Kalman Shan on 08/13/2021 15:25:57 [name], [name] ([id number]) -------------------------------------------------------------------------------- Physical Exam Details Patient Name: [name] Date of Service: 08/13/2021 2:15 PM Medical Record Number: [id number] Patient Account Number: [id number] Date of Birth/Sex: 1965-07-11 (56 y.o. F) Treating RN: Cornell Barman Primary Care Provider: Laurance Flatten Other Clinician: Massie Kluver Referring Provider: Laurance Flatten Treating Provider/Extender: Yaakov Guthrie in Treatment: 14 Constitutional . Cardiovascular . Psychiatric . Notes Right foot: To the plantar aspect on the medial side there is a small open wound with granulation tissue present. Mild maceration to the periwound. Right lower extremity: To the anterior aspect there is a very small open wound limited to skin breakdown. No signs of surrounding infection. Electronic Signature(s) Signed: 08/13/2021 3:29:15 PM By: Heber ,  Janett Billow DO Entered By: Kalman Shan on 08/13/2021 15:26:37 Marzano, [name] ([id number]) -------------------------------------------------------------------------------- Physician Orders Details Patient Name: [name] Date of Service: 08/13/2021 2:15 PM Medical Record Number: [id number] Patient Account Number: [id number] Date of Birth/Sex: 01-22-1966 (56 y.o. F) Treating RN: Cornell Barman Primary Care Provider: Laurance Flatten Other Clinician: Massie Kluver Referring Provider: Laurance Flatten Treating Provider/Extender: Yaakov Guthrie in Treatment: 14 Verbal / Phone Orders: No Diagnosis Coding ICD-10 Coding Code Description E11.621 Type 2 diabetes mellitus with foot ulcer T81.30XA Disruption of wound, unspecified, initial encounter L97.512 Non-pressure chronic ulcer of other part of right foot with fat layer exposed L97.811 Non-pressure chronic ulcer of other part of right lower leg limited to breakdown of skin M86.9 Osteomyelitis,  unspecified Follow-up Appointments o Return Appointment in 1 week. o Nurse Visit as needed o Other: - [facility] has patient on list to call back and set up appointment to be seen Bathing/ Shower/ Hygiene o Clean wound with Normal Saline or wound cleanser. o Wash wounds with antibacterial soap and water. o May shower with wound dressing protected with water repellent cover or cast protector. o No tub bath. Edema Control - Lymphedema / Segmental Compressive Device / Other o Elevate, Exercise Daily and Avoid Standing for Long Periods of Time. o Elevate leg(s) parallel to the floor when sitting. o DO YOUR BEST to sleep in the bed at night. DO NOT sleep in your recliner. Long hours of sitting in a recliner leads to swelling of the legs and/or potential wounds on your backside. Wound Treatment Wound #10RR - Foot Wound Laterality: Plantar, Right Cleanser: Normal Saline 1 x Per Day/30 Days Discharge Instructions: Wash your hands with soap and water. Remove old dressing, discard into plastic bag and place into trash. Cleanse the wound with Normal Saline prior to applying a clean dressing using gauze sponges, not tissues or cotton balls. Do not scrub or use excessive force. Pat dry using gauze sponges, not tissue or cotton balls. Cleanser: Soap and Water 1 x Per Day/30 Days Discharge Instructions: Gently cleanse wound with antibacterial soap, rinse and pat dry prior to dressing wounds Topical: Gentamicin 1 x Per Day/30 Days Discharge Instructions: Apply as directed by provider. Primary Dressing: (BORDER) Zetuvit Plus Silicone Border Dressing 4x4 (in/in) (Generic) 1 x Per Day/30 Days Wound #11 - Lower Leg Wound Laterality: Right, Medial Cleanser: Normal Saline 1 x Per Day/30 Days Discharge Instructions: Wash your hands with soap and water. Remove old dressing, discard into plastic bag and place into trash. Cleanse the wound with Normal Saline prior to applying a clean  dressing using gauze sponges, not tissues or cotton balls. Do not scrub or use excessive force. Pat dry using gauze sponges, not tissue or cotton balls. Cleanser: Soap and Water 1 x Per Day/30 Days Discharge Instructions: Gently cleanse wound with antibacterial soap, rinse and pat dry prior to dressing wounds Topical: Gentamicin 1 x Per Day/30 Days Discharge Instructions: Apply as directed by provider. Primary Dressing: (BORDER) Zetuvit Plus SILICONE BORDER Dressing 5x5 (in/in) (Generic) 1 x Per Day/30 Days [name], [name] ([id number]) Electronic Signature(s) Signed: 08/14/2021 4:52:14 PM By: Kalman Shan DO Signed: 08/19/2021 2:38:02 PM By: Massie Kluver Entered By: Massie Kluver on 08/14/2021 [place], [place] ([id number]) -------------------------------------------------------------------------------- Problem List Details Patient Name: [name] Date of Service: 08/13/2021 2:15 PM Medical Record Number: [id number] Patient Account Number: [id number] Date of Birth/Sex: 10-Sep-1965 (56 y.o. F) Treating RN: Cornell Barman Primary Care Provider: Laurance Flatten Other Clinician: Massie Kluver Referring Provider: Laurance Flatten Treating  Provider/Extender: Yaakov Guthrie in Treatment: 14 Active Problems ICD-10 Encounter Code Description Active Date MDM Diagnosis E11.621 Type 2 diabetes mellitus with foot ulcer 05/07/2021 No Yes T81.30XA Disruption of wound, unspecified, initial encounter 05/07/2021 No Yes L97.512 Non-pressure chronic ulcer of other part of right foot with fat layer 05/07/2021 No Yes exposed L97.811 Non-pressure chronic ulcer of other part of right lower leg limited to 07/23/2021 No Yes breakdown of skin M86.9 Osteomyelitis, unspecified 05/07/2021 No Yes Inactive Problems Resolved Problems Electronic Signature(s) Signed: 08/13/2021 3:29:15 PM By: Kalman Shan DO Entered By: Kalman Shan on 08/13/2021 15:23:00 [name], [name]  ([id number]) -------------------------------------------------------------------------------- Progress Note Details Patient Name: [name] Date of Service: 08/13/2021 2:15 PM Medical Record Number: [id number] Patient Account Number: [id number] Date of Birth/Sex: [birth year] (56 y.o. F) Treating RN: Cornell Barman Primary Care Provider: Laurance Flatten Other Clinician: Massie Kluver Referring Provider: Laurance Flatten Treating Provider/Extender: Yaakov Guthrie in Treatment: 14 Subjective Chief Complaint Information obtained from Patient 05/07/2021; surgical wound dehiscence to the right great toe amputation site. 07/23/2021; patient developed a wound to the anterior right lower extremity limited to skin breakdown from a blister. History of Present Illness (HPI) 10/24/2018 on evaluation today patient presents for initial evaluation or clinic concerning issues that she has been having with lymphedema for quite some time. Unfortunately she has several wound openings at this point that secondary to her lymphedema/venous stasis are giving her trouble and leaking quite severely. She also has diabetes along with hypertension and stage III chronic kidney disease. Fortunately there is no signs of active infection at this time. She is going to likely require debridement of the left leg ulcer upon evaluation today just based on what I am seeing. Fortunately there is no wound opening on the right. Overall the patient seems to be doing quite well and again there is no evidence of systemic infection which is good news. No fevers, chills, nausea, vomiting, or diarrhea. 10/31/2018 on evaluation today patient actually appears to be doing excellent in regard to her lower extremity ulcers. She has been tolerating the dressing changes without complication. Fortunately there is no signs of active infection at this time. She has tolerated 3 layer compression wrap without complication. 11/07/2018 upon evaluation today  patient actually appears to be doing very well with regard to her left lower extremity ulcers. She has been tolerating the dressing changes without complication. Fortunately there is no signs of active infection. No fevers, chills, nausea, vomiting, or diarrhea. 11/21/2018 upon evaluation today patient appears to be doing quite well with regard to her lower extremity ulcers. In fact both areas seem to be showing signs of good improvement which is excellent. She is not having as much pain as she has in the past and again has a lot of healing compared to previous visits as well. 12/05/2018 on evaluation today patient presents for follow-up concerning her ongoing issues with her bilateral lower extremity ulcers. The good news is her right lower extremity is showing signs of completely healing at this time which is great news. Fortunately there is no evidence of active infection. On the left she has just a very small area still remaining that is open at this time all in all she is very close to complete closure in my opinion. She does have compression stockings to wear at home. 12/12/2018 on evaluation today patient appears to be doing excellent in regard to her left lateral lower extremity ulcer. She has been tolerating the dressing changes without complication. Fortunately there is  no signs of active infection at this time. In fact this appears to be pretty much healed at this point although again she is not 100% today. No fevers, chills, nausea, vomiting, or diarrhea. 12/19/2018 on evaluation today patient actually appears to be doing excellent with regard to her lower extremity ulcer in fact this appears to be completely healed today which is all some. She has done extremely well with wound care measures. No fevers, chills, nausea, vomiting, or diarrhea. ------------------------------------ 11/01/19-Readmission to the clinic Patient presents with left leg pain, wounds posterior calf, onset about 4  weeks, denies any fevers chills or shakes, has not been using anything to these wounds, was given compression stockings at last discharge from the clinic but has not been able to use them as they rolled down and cause creases Patient's history significant for type 2 diabetes insulin requiring, A1c of 6.9 lately, hypertension, chronic pain 8/18; patient readmitted that the clinic last week. She has wounds on her left lateral posterior lower leg all of this in close juxtaposition i.e. a localized site. We've been using silver alginate under 3 layer compression apparently the wound surface area is better. She was wearing compression stockings from [facility] but says they were falling down. As she progresses more towards healing will need to address what we use in terms of compression stockings perhaps external compression garments 11/17/2019 on evaluation today patient appears to be doing well with regard to her legs currently. She does have 2 wounds which are actually measuring much better the more posterior is actually doing very well which I am pleased with the other though smaller is not quite a small but nonetheless on the lateral aspect does seem to be improving. 11/23/2019 on evaluation today patient appears to be doing well in regard to her wounds. In fact the wound on the posterior aspect of her leg appears healed the lateral aspect is still open but extremely small. 9/15; this is a patient with chronic venous insufficiency and secondary lymphedema. She did not keep her clinic appointment last week and hence the left leg is a lot more swollen since she had to take the wrap off at some point. She has a small weeping area on the left lateral lower leg. She has probably a juxta lite stocking for the left leg I have asked her to bring that in when she comes to her clinic appointment next week at which time she should be healed 10/6; this is a patient with chronic venous insufficiency and  secondary lymphedema left greater than right. Skin changes of chronic lymphedema especially in the left leg. She has not been here in 3 weeks. She took the wrap off 2 weeks ago. She has some very old 20/30 stockings from [facility] in [place] she has been using. Fortunately her leg is closed. She also has a single [place] wrap for the left leg Readmission: [name], [name] ([id number]) 06/18/2020 upon evaluation today patient presents for reevaluation here in the clinic. She is a long-term patient that we have seen intermittently when she has had flareups of issues with her lower extremity secondary to chronic venous stasis. With that being said unfortunately today she is having a significant issue with a wound which is actually quite significant over the right lower extremity. She did see dermatology they put Vaseline followed by [place] dressings on her unfortunately she is continuing to have significant issues however here with pain and in fact this wrap was extremely stuck to her upon  evaluation today. Nonetheless I believe that she is going to require a little bit different approach to try to keep things from sticking and hopefully aid in getting this to heal more effectively and quickly. With that being said she does have a history of chronic venous insufficiency, lymphedema, diabetes mellitus type 2, chronic kidney disease stage III, hypertension, and congestive heart failure. This started as an injury in mid February and has worsened over the past 2 weeks. 07/04/2020 upon evaluation today patient appears to be doing well with regard to her wound on the leg. Overall I am extremely pleased with where things stand and I do think that she is making excellent progress. There is no sign of infection right now which is also great news. I think that she is close to complete resolution. 07/09/20 upon evaluation today patient appears to be doing excellent in regard to her leg ulcer. She has been  tolerating the dressing changes without complication and in general I am extremely pleased with where things stand today. There does not appear to be any signs of active infection at this time which is great news. 07/25/2020 upon evaluation today patient appears to be doing well with regard to her wound. She has been tolerating the dressing changes without complication. Fortunately there is no signs of active infection at this time and her wound appears to be completely healed. Readmission: 09/30/2020 upon evaluation today patient appears for reevaluation here in our clinic. She was discharged May 5 that unfortunately has started to have weeping and wounds on the left leg at this point. She tells me the right leg did weep some but nothing as significant as the left. With all that being said at this point the patient states that she figured it was best to come in here and she was having a lot of drainage and it was get all of her sheets and everything. She has been wearing her Velcro wraps sometimes but it does not sound like she is been wearing them all the time which I think is part of the reason why this reopened. She also unfortunately had a fall which was quite significant. This occurred I believe about a week or 2 ago. Otherwise patient's past medical history really has not changed significantly. 10/07/2020 upon evaluation today patient appears to be doing better in regard to her wounds of the lower extremities. Fortunately there does not appear to be any signs of active infection at this time which is great news. I do not see any evidence of anything worsening but again she continues to have significant bilateral stage III lymphedema. This has been an ongoing issue. We actually have seen her this year pretty much from March through May of 2022. Following this she continued with compression at home following but then unfortunately reopened again. That was on July 11. With that being said this is an  ongoing and recurrent issue for which honestly have seen her over the past 2 years pretty much consistently. Nonetheless we have had Velcro compression wraps that she uses at home and despite this she continues to have frequent and recurrent ulcerations. 7/27; patient presents for 1 week follow-up. She has no issues or complaints today. She denies signs of infection. 10/21/2020 upon evaluation today patient's leg actually appears to be doing decently well on the left there is nothing open on the right at all and I am pleased in that regard. On the left side I do think that she still has some areas  of weeping I believe the compression wrap is still the best way to go although I am hopeful that we are headed in the right direction as far as getting this area to heal and silver. Upon inspection patient's wound bed actually showed signs of good granulation epithelization at this point. There does not appear to be any evidence of active infection which is great news and overall I am extremely pleased with where things stand. 11/04/2020 upon evaluation today patient appears to be doing well with regard to her left leg. In fact this is getting very close to complete granulation and epithelization at this point I am very pleased with where we stand. I do not see any signs of infection currently. 11/18/2020 upon evaluation today patient appears to be doing well in regard to her wound. She is in fact doing extremely well considering the fact we did need to see her last week she was apparently sick and missed her appointment. She is therefore had the wrap on for 2 weeks. Fortunately there is no signs of active infection at this time. No fevers, chills, nausea, vomiting, or diarrhea. 11/28/2020 upon evaluation today patient appears to be doing well with regard to her legs. Things are healing nicely and overall I do not see any signs of infection at this time which is great news. No fevers, chills, nausea, vomiting, or  diarrhea. With that being said she still has a couple areas that are open and weeping currently. 12/10/2020 upon evaluation today patient appears to be doing well with regard to her wound. She has been tolerating the dressing changes without complication. Fortunately there does not appear to be any signs of active infection at this time. No fevers, chills, nausea, vomiting, or diarrhea. 10/11; patient comes in today with really excellent edema control. Everything appears to be closed in clinic occluding the left lateral. The area on the right anterior has some eschar on the top I did not remove this. She has dry scaly skin which might benefit from ammonium lactate but for now I just discharged her with a moisturizer. The patient is also apparently fractured her right great toe. We gave her some stockinette to wear under her juxta lite on that side Readmission 05/07/2021 Ms. 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 There was a revision of the site on 03/13/2021. She has been using gauze to the wound site.She reports pain to the medial aspect with drainage. Home health was changing the dressing however wound care visits have expired. She is trying to do the dressing change on her own but reports having trouble with this. She currently denies fever/chills. 2/22; patient presents for follow-up. She reports taking Augmentin for the past week. She reports heartburn from this and would like the solution version. She is on doxycycline prescribed by another provider for the next few months. She currently denies systemic signs of infection. She is scheduled for her MRI On 2/27. She has home health and is changing the dressing with  Hydrofera Blue. She is unable to do this daily. She has a family member present to help watch how to do the dressing. 3/1; patient presents for follow-up. She was able to obtain the solution for Augmentin from her  pharmacy however there was a short supply and this is on backorder currently. She continues to take doxycycline as prescribed. She reports improvement in her foot pain. She had her MRI completed. She states she has not scheduled an appointment with vein and vascular. She is doing [employer] with dressing changes. She [name], [name] ([id number]) denies systemic signs of infection. 3/8; patient presents for follow-up. She reports improvement in wound healing. She has been using Hydrofera Blue. She has not been able to get in contact with vein and vascular to schedule an appointment to see her vascular surgeon. She states she has an appointment with her orthopedic surgeon at the end of the month. 3/29; patient has missed her last follow-up schedule two weeks ago. Since then she has been evaluated by Dr. Wilhelmenia Blase, vascular surgery. There is nothing further to do from a vascular perspective. She has adequate blood flow for healing. She missed her appointment with Dr. Doreatha Lew her surgeon yesterday. She completed her course of Bactrim that I prescribed. She reports minimal drainage from the wound bed. She reports improvement in wound healing. She is using Hydrofera Blue to the wound beds. She states that home health has discharged her. She denies signs of infection. 4/5; patient presents for follow-up. She has been using Hydrofera Blue daily to the wound beds. She denies signs of infection. She has no issues or complaints today. 4/19; patient presents for follow-up. She has been using Hydrofera Blue daily to the wound beds. A small area on the medial aspect of the foot had blistered up and opened. Currently she denies signs of infection. 4/26; patient presents for follow-up. She has been  using Hydrofera Blue with no issues. The medial aspect of the foot that had blistered up and opened at previous clinic visit has healed. She denies signs of infection. 5/3; Patient presents for follow-up. She has been trying to use Hydrofera Blue to the wound bed however this is not staying in place very well. Unfortunately she has developed another wound to the right anterior lower extremity. It was previously a blister. There is still devitalized tissue present. She currently denies signs of infection. 5/10; patient presents for follow-up. She states that the compression wrap Slipped down her leg after it was placed last week.. She reports using gentamicin ointment to the foot wound. She currently denies signs of infection. 5/17; patient presents for follow-up. She states that the compression wrap did not stay in place. She reports she had dried skin to the previous wound site on the right bottom foot for which she removed. She states now she has a wound there. She currently denies signs of infection. 5/24; patient presents for follow-up. She has been using gentamicin ointment to the wound beds. She reports improvement in wound healing. She has no issues or complaints today. She denies signs of infection. Objective Constitutional Vitals Time Taken: 2:40 PM, Temperature: 98.6 F, Pulse: 88 bpm, Respiratory Rate: 18 breaths/min, Blood Pressure: 151/81 mmHg. General Notes: Right foot: To the plantar aspect on the medial side there is a small open wound with granulation tissue present. Mild maceration to the periwound. Right lower extremity: To the anterior aspect there is a very small open wound limited to skin breakdown. No signs of surrounding infection. Integumentary (Hair, Skin) Wound #10RR status is Open. Original cause of wound was Surgical Injury. The date acquired was: 08/05/2021. The wound has been in treatment 14 weeks. The wound is located on the [place]. The wound measures  0.3cm length  x 1cm width x 0.2cm depth; 0.236cm^2 area and 0.047cm^3 volume. There is Fat Layer (Subcutaneous Tissue) exposed. There is a medium amount of serosanguineous drainage noted. There is small (1-33%) red, pink granulation within the wound bed. There is a large (67-100%) amount of necrotic tissue within the wound bed including Adherent Slough. Wound #11 status is Open. Original cause of wound was Blister. The date acquired was: 07/20/2021. The wound has been in treatment 3 weeks. The wound is located on the Right,Medial Lower Leg. The wound measures 0.7cm length x 1cm width x 0.1cm depth; 0.55cm^2 area and 0.055cm^3 volume. There is Fat Layer (Subcutaneous Tissue) exposed. There is a medium amount of serosanguineous drainage noted. There is large (67-100%) red granulation within the wound bed. There is a small (1-33%) amount of necrotic tissue within the wound bed. 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 I recommended continuing the course with gentamicin ointment daily. I am hopeful she will be healed in the next week. Follow-up in 1 week Plan 1. Gentamicin ointment daily 2. Follow-up in 1 week Electronic Signature(s) Signed: 08/13/2021 3:29:15 PM By: Kalman Shan DO Entered By: Kalman Shan on 08/13/2021 15:27:18 [name], [name] ([id number]) -------------------------------------------------------------------------------- SuperBill Details Patient Name: [name] Date of Service: 08/13/2021 Medical Record Number: [id number] Patient Account Number: [id number] Date of Birth/Sex: 20-Jul-1965 (55  y.o. F) Treating RN: Cornell Barman Primary Care Provider: Laurance Flatten Other Clinician: Massie Kluver Referring Provider: Laurance Flatten Treating Provider/Extender: Yaakov Guthrie in Treatment: 14 Diagnosis Coding ICD-10 Codes Code Description E11.621 Type 2 diabetes mellitus with foot ulcer T81.30XA Disruption of wound, unspecified, initial encounter L97.512 Non-pressure chronic ulcer of other part of right foot with fat layer exposed L97.811 Non-pressure chronic ulcer of other part of right lower leg limited to breakdown of skin M86.9 Osteomyelitis, unspecified Facility Procedures CPT4 Code: 25003704 Description: 99213 - WOUND CARE VISIT-LEV 3 EST PT Modifier: Quantity: 1 Physician Procedures CPT4 Code: 8889169 Description: 99213 - WC PHYS LEVEL 3 - EST PT Modifier: Quantity: 1 CPT4 Code: Description: ICD-10 Diagnosis Description L97.512 Non-pressure chronic ulcer of other part of right foot with fat layer expo L97.811 Non-pressure chronic ulcer of other part of right lower leg limited to bre E11.621 Type 2 diabetes mellitus with foot  ulcer M86.9 Osteomyelitis, unspecified Modifier: sed akdown of skin Quantity: Electronic Signature(s) Signed: 08/14/2021 4:52:14 PM By: Kalman Shan DO Signed: 08/19/2021 2:38:02 PM By: Massie Kluver Previous Signature: 08/13/2021 3:29:15 PM Version By: Kalman Shan DO Entered By: Massie Kluver on 08/14/2021 09:44:04

## 2021-08-19 NOTE — Progress Notes (Signed)
YENTY, BLOCH ([id number]) Visit Report for 08/13/2021 Arrival Information Details Patient Name: [name] [name], [name] [name] Date of Service: 08/13/2021 2:15 PM Medical Record Number: [id number] Patient Account Number: [id number] Date of Birth/Sex: 11/17/1965 (56 y.o. F) Treating RN: [name] [name] Primary Care [name] [name]: [name] [name] Other Clinician: Massie [name] Referring Gurtha Picker: [name] [name] Treating [name] [name]/Extender: [name] [name] in Treatment: 14 Visit Information History Since Last Visit Added or deleted any medications: No Patient Arrived: Ambulatory Any new allergies or adverse reactions: No Arrival Time: 14:29 Had a fall or experienced change in No Transfer Assistance: None activities of daily living that may affect Patient Requires Transmission-Based Precautions: No risk of falls: Patient Has Alerts: Yes Signs or symptoms of abuse/neglect since last visito No Patient Alerts: diabetic Hospitalized since last visit: No Pain Present Now: Yes Electronic Signature(s) Signed: 08/19/2021 2:38:02 PM By: [name] [name] Entered By: [name] [name] on 08/13/2021 14:30:22 [name] [name], [name] [name] ([id number]) -------------------------------------------------------------------------------- Clinic Level of Care Assessment Details Patient Name: [name] [name] Date of Service: 08/13/2021 2:15 PM Medical Record Number: [id number] Patient Account Number: [id number] Date of Birth/Sex: 1965/08/05 (56 y.o. F) Treating RN: [name] [name] Primary Care Hagen Tidd: [name] [name] Other Clinician: Massie [name] Referring Arvid Marengo: [name] [name] Treating Delisia Mcquiston/Extender: [name] [name] in Treatment: 14 Clinic Level of Care Assessment Items TOOL 4 Quantity Score '[]'$  - Use when only an EandM is performed on FOLLOW-UP visit 0 ASSESSMENTS - Nursing Assessment / Reassessment X - Reassessment of Co-morbidities (includes updates in patient status) 1 10 X- 1 5 Reassessment of Adherence to Treatment Plan ASSESSMENTS  - Wound and Skin Assessment / Reassessment '[]'$  - Simple Wound Assessment / Reassessment - one wound 0 X- 2 5 Complex Wound Assessment / Reassessment - multiple wounds '[]'$  - 0 Dermatologic / Skin Assessment (not related to wound area) ASSESSMENTS - Focused Assessment '[]'$  - Circumferential Edema Measurements - multi extremities 0 '[]'$  - 0 Nutritional Assessment / Counseling / Intervention '[]'$  - 0 Lower Extremity Assessment (monofilament, tuning fork, pulses) '[]'$  - 0 Peripheral Arterial Disease Assessment (using hand held doppler) ASSESSMENTS - Ostomy and/or Continence Assessment and Care '[]'$  - Incontinence Assessment and Management 0 '[]'$  - 0 Ostomy Care Assessment and Management (repouching, etc.) PROCESS - Coordination of Care X - Simple Patient / Family Education for ongoing care 1 15 '[]'$  - 0 Complex (extensive) Patient / Family Education for ongoing care '[]'$  - 0 Staff obtains Programmer, systems, Records, Test Results / Process Orders '[]'$  - 0 Staff telephones HHA, Nursing Homes / Clarify orders / etc '[]'$  - 0 Routine Transfer to another Facility (non-emergent condition) '[]'$  - 0 Routine Hospital Admission (non-emergent condition) '[]'$  - 0 New Admissions / Biomedical engineer / Ordering NPWT, Apligraf, etc. '[]'$  - 0 Emergency Hospital Admission (emergent condition) X- 1 10 Simple Discharge Coordination '[]'$  - 0 Complex (extensive) Discharge Coordination PROCESS - Special Needs '[]'$  - Pediatric / Minor Patient Management 0 '[]'$  - 0 Isolation Patient Management '[]'$  - 0 Hearing / Language / Visual special needs '[]'$  - 0 Assessment of Community assistance (transportation, D/C planning, etc.) '[]'$  - 0 Additional assistance / Altered mentation '[]'$  - 0 Support Surface(s) Assessment (bed, cushion, seat, etc.) INTERVENTIONS - Wound Cleansing / Measurement [name], [name] ([id number]) '[]'$  - 0 Simple Wound Cleansing - one wound X- 2 5 Complex Wound Cleansing - multiple wounds X- 1 5 Wound Imaging (photographs  - any number of wounds) '[]'$  - 0 Wound Tracing (instead of photographs) '[]'$  - 0 Simple Wound Measurement - one wound X- 2 5 Complex Wound Measurement - multiple  wounds INTERVENTIONS - Wound Dressings '[]'$  - Small Wound Dressing one or multiple wounds 0 X- 2 15 Medium Wound Dressing one or multiple wounds '[]'$  - 0 Large Wound Dressing one or multiple wounds '[]'$  - 0 Application of Medications - topical '[]'$  - 0 Application of Medications - injection INTERVENTIONS - Miscellaneous '[]'$  - External ear exam 0 '[]'$  - 0 Specimen Collection (cultures, biopsies, blood, body fluids, etc.) '[]'$  - 0 Specimen(s) / Culture(s) sent or taken to Lab for analysis '[]'$  - 0 Patient Transfer (multiple staff / Harrel Lemon Lift / Similar devices) '[]'$  - 0 Simple Staple / Suture removal (25 or less) '[]'$  - 0 Complex Staple / Suture removal (26 or more) '[]'$  - 0 Hypo / Hyperglycemic Management (close monitor of Blood Glucose) '[]'$  - 0 Ankle / Brachial Index (ABI) - do not check if billed separately X- 1 5 Vital Signs Has the patient been seen at the hospital within the last three years: Yes Total Score: 110 Level Of Care: New/Established - Level 3 Electronic Signature(s) Signed: 08/19/2021 2:38:02 PM By: [name] [name] Entered By: [name] [name] on 08/13/2021 15:29:08 [name], [name] Mood ([id number]) -------------------------------------------------------------------------------- Encounter Discharge Information Details Patient Name: [name] [name] Date of Service: 08/13/2021 2:15 PM Medical Record Number: [id number] Patient Account Number: [id number] Date of Birth/Sex: 1966/02/04 (56 y.o. F) Treating RN: [name] [name] Primary Care Winnie Umali: [name] [name] Other Clinician: Massie [name] Referring Dezirae Service: [name] [name] Treating Sumer Moorehouse/Extender: [name] [name] in Treatment: 14 Encounter Discharge Information Items Discharge Condition: Stable Ambulatory Status: Ambulatory Discharge Destination: Home Transportation: Private  Auto Accompanied By: self Schedule Follow-up Appointment: Yes Clinical Summary of Care: Electronic Signature(s) Signed: 08/19/2021 2:38:02 PM By: [name] [name] Entered By: [name] [name] on 08/14/2021 09:45:15 Clugston, [name] Mood ([id number]) -------------------------------------------------------------------------------- Lower Extremity Assessment Details Patient Name: [name] [name] Date of Service: 08/13/2021 2:15 PM Medical Record Number: [id number] Patient Account Number: [id number] Date of Birth/Sex: 29-Mar-1965 (56 y.o. F) Treating RN: [name] [name] Primary Care Jaelee Laughter: [name] [name] Other Clinician: Massie [name] Referring Emileo Semel: [name] [name] Treating Zeta Bucy/Extender: [name] [name] in Treatment: 14 Edema Assessment Assessed: Shirlyn Goltz: No] Patrice Paradise: No] Edema: [Left: N] [Right: o] Vascular Assessment Pulses: Dorsalis Pedis Palpable: [Right:Yes] Electronic Signature(s) Signed: 08/14/2021 5:36:30 PM By: Gretta Cool, BSN, RN, CWS, Kim RN, BSN Signed: 08/19/2021 2:38:02 PM By: [name] [name] Entered By: [name] [name] on 08/13/2021 15:25:36 [name] [name], [name] ([id number]) -------------------------------------------------------------------------------- Multi Wound Chart Details Patient Name: [name] [name] Date of Service: 08/13/2021 2:15 PM Medical Record Number: [id number] Patient Account Number: [id number] Date of Birth/Sex: [birth year] (55 y.o. 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 N/A Extremity Extremity Comorbid History: Sleep Apnea, Congestive Heart Sleep Apnea, Congestive Heart N/A Failure, Coronary Artery Disease, Failure, Coronary Artery Disease, Hypertension, Myocardial Infarction, Hypertension, Myocardial Infarction, Peripheral Venous Disease, Type II Peripheral Venous Disease, Type II Diabetes, End Stage Renal Disease, Diabetes, End Stage Renal Disease, Gout, Neuropathy Gout, Neuropathy Date Acquired: 08/05/2021 07/20/2021 N/A Weeks of Treatment: 14 3 N/A Wound Status: Open Open N/A Wound Recurrence: Yes No N/A Clustered Wound: Yes No N/A Clustered Quantity: 2 N/A N/A Pending Amputation on Yes No N/A Presentation: Measurements L x W x D (  cm) 0.3x1x0.2 0.7x1x0.1 N/A Area (cm) : 0.236 0.55 N/A Volume (cm) : 0.047 0.055 N/A % Reduction in Area: 93.50% 98.60% N/A % Reduction in Volume: 87.00% 98.60% N/A Classification: Grade 2 Grade 1 N/A Exudate Amount: Medium Medium N/A Exudate Type: Serosanguineous Serosanguineous N/A Exudate Color: red, brown red, brown N/A Granulation Amount: Small (1-33%) Large (67-100%) N/A Granulation Quality: Red, Pink Red N/A Necrotic Amount: Large (67-100%) Small (1-33%) N/A Exposed Structures: Fat Layer (Subcutaneous Tissue): Fat Layer (Subcutaneous Tissue): N/A Yes Yes Fascia: No Tendon: No Muscle: No Joint: No Bone: No Epithelialization: Small (1-33%) Small (1-33%) N/A Treatment Notes [name] [name], [name] ([id number]) Electronic Signature(s) Signed: 08/19/2021 2:38:02 PM By: [name] [name] Entered By: [name] [name] on 08/13/2021 15:25:47 [name] [name], [name] ([id number]) -------------------------------------------------------------------------------- Multi-Disciplinary Care Plan Details Patient Name: [name] [name] Date of Service: 08/13/2021 2:15 PM Medical Record Number: [id number] Patient Account Number: [id number] Date of Birth/Sex: [birth year] (56 y.o. F) Treating RN: [name] [name] Primary Care  Domnique Vanegas: [name] [name] Other Clinician: Massie [name] Referring Shandell Jallow: [name] [name] Treating Adi Doro/Extender: [name] [name] in Treatment: 14 Active Inactive Wound/Skin Impairment Nursing Diagnoses: Impaired tissue integrity Knowledge deficit related to ulceration/compromised skin integrity Goals: Ulcer/skin breakdown will have a volume reduction of 30% by week 4 Date Initiated: 05/07/2021 Target Resolution Date: 06/04/2021 Goal Status: Active Ulcer/skin breakdown will have a volume reduction of 50% by week 8 Date Initiated: 05/07/2021 Target Resolution Date: 07/02/2021 Goal Status: Active Ulcer/skin breakdown will have a volume reduction of 80% by week 12 Date Initiated: 05/07/2021 Target Resolution Date: 07/30/2021 Goal Status: Active Ulcer/skin breakdown will heal within 14 weeks Date Initiated: 05/07/2021 Target Resolution Date: 08/13/2021 Goal Status: Active Interventions: Assess patient/caregiver ability to obtain necessary supplies Assess patient/caregiver ability to perform ulcer/skin care regimen upon admission and as needed Assess ulceration(s) every visit Provide education on ulcer and skin care Notes: Electronic Signature(s) Signed: 08/14/2021 5:36:30 PM By: Gretta Cool, BSN, RN, CWS, Kim RN, BSN Signed: 08/19/2021 2:38:02 PM By: [name] [name] Entered By: [name] [name] on 08/13/2021 15:25:40 [name] [name], [name] ([id number]) -------------------------------------------------------------------------------- Pain Assessment Details Patient Name: [name] [name] Date of Service: 08/13/2021 2:15 PM Medical Record Number: [id number] Patient Account Number: [id number] Date of Birth/Sex: August 21, 1965 (56 y.o. F) Treating RN: [name] [name] Primary Care Hanford Lust: [name] [name] Other Clinician: Massie [name] Referring Malyk Girouard: [name] [name] Treating Johnnae Impastato/Extender: [name] [name] in Treatment: 14 Active Problems Location of Pain Severity and Description of Pain Patient Has  Paino Yes Site Locations Pain Location: Pain in Ulcers With Dressing Change: No Duration of the Pain. Constant / Intermittento Intermittent Rate the pain. Current Pain Level: 5 Character of Pain Describe the Pain: Burning, Sharp Pain Management and Medication Current Pain Management: Medication: Yes Rest: Yes How does your wound impact your activities of daily livingo Sleep: Yes Electronic Signature(s) Signed: 08/14/2021 5:36:30 PM By: Gretta Cool, BSN, RN, CWS, Kim RN, BSN Signed: 08/19/2021 2:38:02 PM By: [name] [name] Entered By: [name] [name] on 08/13/2021 14:47:24 [name] [name], [name] Mood ([id number]) -------------------------------------------------------------------------------- Patient/Caregiver Education Details Patient Name: [name] [name] Date of Service: 08/13/2021 2:15 PM Medical Record Number: [id number] Patient Account Number: [id number] Date of Birth/Gender: 05/28/1965 (55 y.o. F) Treating RN: [name] [name] Primary Care Physician: [name] [name] Other Clinician: Massie [name] Referring Physician: Laurance [name] Treating Physician/Extender: [name] [name] in Treatment: 14 Education Assessment Education Provided To: Patient Education Topics Provided Infection: Handouts: Infection Prevention and Management Electronic Signature(s) Signed: 08/19/2021 2:38:02 PM By: [name] [name] Entered By: [name] [name] on 08/13/2021 15:30:17 Siers, [name] Mood ([id number]) -------------------------------------------------------------------------------- Wound Assessment Details Patient Name: [name] [name] Date  of Service: 08/13/2021 2:15 PM Medical Record Number: [id number] Patient Account Number: [id number] Date of Birth/Sex: 1965/06/25 (56 y.o. F) Treating RN: [name] [name] Primary Care Geneve Kimpel: [name] [name] Other Clinician: Massie [name] Referring Ting Cage: [name] [name] Treating Taurus Willis/Extender: [name] [name] in Treatment: 14 Wound Status Wound Number: [id number] Primary Diabetic  Wound/Ulcer of the Lower Extremity Etiology: Wound Location: Right, Plantar Foot Wound Open Wounding Event: Surgical Injury Status: Date Acquired: 08/05/2021 Comorbid Sleep Apnea, Congestive Heart Failure, Coronary Artery Weeks Of Treatment: 14 History: Disease, Hypertension, Myocardial Infarction, Peripheral Clustered Wound: Yes Venous Disease, Type II Diabetes, End Stage Renal Pending Amputation On Presentation Disease, Gout, Neuropathy Photos Wound Measurements Length: (cm) 0.3 Width: (cm) 1 Depth: (cm) 0.2 Clustered Quantity: 2 Area: (cm) 0.236 Volume: (cm) 0.047 % Reduction in Area: 93.5% % Reduction in Volume: 87% Epithelialization: Small (1-33%) Wound Description Classification: Grade 2 Exudate Amount: Medium Exudate Type: Serosanguineous Exudate Color: red, brown Foul Odor After Cleansing: No Slough/Fibrino Yes Wound Bed Granulation Amount: Small (1-33%) Exposed Structure Granulation Quality: Red, Pink Fascia Exposed: No Necrotic Amount: Large (67-100%) Fat Layer (Subcutaneous Tissue) Exposed: Yes Necrotic Quality: Adherent Slough Tendon Exposed: No Muscle Exposed: No Joint Exposed: No Bone Exposed: No Treatment Notes Wound #10RR (Foot) Wound Laterality: Plantar, Right Cleanser Normal Saline Discharge Instruction: Wash your hands with soap and water. Remove old dressing, discard into plastic bag and place into trash. Cleanse the wound with Normal Saline prior to applying a clean dressing using gauze sponges, not tissues or cotton balls. Do not [place], [place] ([id number]) scrub or use excessive force. Pat dry using gauze sponges, not tissue or cotton balls. Soap and Water Discharge Instruction: Gently cleanse wound with antibacterial soap, rinse and pat dry prior to dressing wounds Peri-Wound Care Topical Gentamicin Discharge Instruction: Apply as directed by Casilda Pickerill. Primary Dressing (BORDER) Zetuvit Plus Silicone Border Dressing 4x4  (in/in) Secondary Dressing Secured With Compression Wrap Compression Stockings Add-Ons Electronic Signature(s) Signed: 08/14/2021 5:36:30 PM By: Gretta Cool, BSN, RN, CWS, Kim RN, BSN Signed: 08/19/2021 2:38:02 PM By: [name] [name] Entered By: [name] [name] on 08/13/2021 14:48:39 Lentz, Jacee ([id number]) -------------------------------------------------------------------------------- Wound Assessment Details Patient Name: [name] [name] Date of Service: 08/13/2021 2:15 PM Medical Record Number: [id number] Patient Account Number: [id number] Date of Birth/Sex: [birth year] (55 y.o. F) Treating RN: [name] [name] Primary Care Sunset Joshi: [name] [name] Other Clinician: Massie [name] Referring Lonya Johannesen: [name] [name] Treating Anthoni Geerts/Extender: [name] [name] in Treatment: 14 Wound Status Wound Number: 11 Primary Diabetic Wound/Ulcer of the Lower Extremity Etiology: Wound Location: Right, Medial Lower Leg Wound Open Wounding Event: Blister Status: Date Acquired: 07/20/2021 Comorbid Sleep Apnea, Congestive Heart Failure, Coronary Artery Weeks Of Treatment: 3 History: Disease, Hypertension, Myocardial Infarction, Peripheral Clustered Wound: No Venous Disease, Type II Diabetes, End Stage Renal Disease, Gout, Neuropathy Photos Wound Measurements Length: (cm) 0.7 Width: (cm) 1 Depth: (cm) 0.1 Area: (cm) 0.55 Volume: (cm) 0.055 % Reduction in Area: 98.6% % Reduction in Volume: 98.6% Epithelialization: Small (1-33%) Wound Description Classification: Grade 1 Exudate Amount: Medium Exudate Type: Serosanguineous Exudate Color: red, brown Foul Odor After Cleansing: No Slough/Fibrino Yes Wound Bed Granulation Amount: Large (67-100%) Exposed Structure Granulation Quality: Red Fat Layer (Subcutaneous Tissue) Exposed: Yes Necrotic Amount: Small (1-33%) Treatment Notes Wound #11 (Lower Leg) Wound Laterality: Right, Medial Cleanser Normal Saline Discharge Instruction: Wash your hands  with soap and water. Remove old dressing, discard into plastic bag and place into trash. Cleanse the wound with Normal Saline prior to applying a clean dressing using gauze sponges, not tissues or  cotton balls. Do not scrub or use excessive force. Pat dry using gauze sponges, not tissue or cotton balls. Soap and Water Discharge Instruction: Gently cleanse wound with antibacterial soap, rinse and pat dry prior to dressing wounds Whirley, Makenly ([id number]) Peri-Wound Care Topical Gentamicin Discharge Instruction: Apply as directed by Aftin Lye. Primary Dressing (BORDER) Zetuvit Plus SILICONE BORDER Dressing 5x5 (in/in) Secondary Dressing Secured With Compression Wrap Compression Stockings Add-Ons Electronic Signature(s) Signed: 08/14/2021 5:36:30 PM By: Gretta Cool, BSN, RN, CWS, Kim RN, BSN Signed: 08/19/2021 2:38:02 PM By: [name] [name] Entered By: [name] [name] on 08/13/2021 15:25:27 Mckiver, [name] Mood ([id number]) -------------------------------------------------------------------------------- Vitals Details Patient Name: [name] [name] Date of Service: 08/13/2021 2:15 PM Medical Record Number: [id number] Patient Account Number: [id number] Date of Birth/Sex: 07-28-1965 (56 y.o. F) Treating RN: [name] [name] Primary Care Zivah Mayr: [name] [name] Other Clinician: Massie [name] Referring Rory Montel: [name] [name] Treating Chayden Garrelts/Extender: [name] [name] in Treatment: 14 Vital Signs Time Taken: 14:40 Temperature (F): 98.6 Pulse (bpm): 88 Respiratory Rate (breaths/min): 18 Blood Pressure (mmHg): 151/81 Reference Range: 80 - 120 mg / dl Electronic Signature(s) Signed: 08/19/2021 2:38:02 PM By: [name] [name] Entered By: [name] [name] on 08/13/2021 14:45:44

## 2021-08-20 ENCOUNTER — Encounter (HOSPITAL_BASED_OUTPATIENT_CLINIC_OR_DEPARTMENT_OTHER): Payer: Medicare Other | Admitting: Internal Medicine

## 2021-08-20 DIAGNOSIS — M869 Osteomyelitis, unspecified: Secondary | ICD-10-CM

## 2021-08-20 DIAGNOSIS — E11621 Type 2 diabetes mellitus with foot ulcer: Secondary | ICD-10-CM | POA: Diagnosis not present

## 2021-08-20 DIAGNOSIS — T8130XA Disruption of wound, unspecified, initial encounter: Secondary | ICD-10-CM

## 2021-08-20 DIAGNOSIS — L97512 Non-pressure chronic ulcer of other part of right foot with fat layer exposed: Secondary | ICD-10-CM | POA: Diagnosis not present

## 2021-08-20 NOTE — Progress Notes (Signed)
KITARA, HEBB ([id number]) Visit Report for 08/20/2021 Arrival Information Details Patient Name: [name], SPONSEL Date of Service: 08/20/2021 3:00 PM Medical Record Number: [id number] Patient Account Number: [id number] Date of Birth/Sex: 1966-01-05 (56 y.o. F) Treating RN: Alycia Rossetti Primary Care Mayumi Summerson: Laurance Flatten Other Clinician: Referring Tarik Teixeira: Laurance Flatten Treating Anshu Wehner/Extender: Yaakov Guthrie in Treatment: 15 Visit Information History Since Last Visit Any new allergies or adverse reactions: No Patient Arrived: [name] Had a fall or experienced change in No Arrival Time: 15:09 activities of daily living that may affect Accompanied By: daughter risk of falls: Transfer Assistance: None Hospitalized since last visit: No Patient Requires Transmission-Based Precautions: No Has Dressing in Place as Prescribed: Yes Patient Has Alerts: Yes Pain Present Now: No Patient Alerts: diabetic Electronic Signature(s) Signed: 08/20/2021 4:29:39 PM By: Alycia Rossetti Entered By: Alycia Rossetti on 08/20/2021 16:29:39 Herbel, Lizett ([id number]) -------------------------------------------------------------------------------- Clinic Level of Care Assessment Details Patient Name: [name] Date of Service: 08/20/2021 3:00 PM Medical Record Number: [id number] Patient Account Number: [id number] Date of Birth/Sex: 11/13/1965 (56 y.o. F) Treating RN: Alycia Rossetti Primary Care Dennies Coate: Laurance Flatten Other Clinician: Referring Berniece Abid: Laurance Flatten Treating Dustee Bottenfield/Extender: Yaakov Guthrie in Treatment: 15 Clinic Level of Care Assessment Items TOOL 4 Quantity Score '[]'$  - Use when only an EandM is performed on FOLLOW-UP visit 0 ASSESSMENTS - Nursing Assessment / Reassessment X - Reassessment of Co-morbidities (includes updates in patient status) 1 10 X- 1 5 Reassessment of Adherence to Treatment Plan ASSESSMENTS - Wound and Skin Assessment / Reassessment X - Simple Wound  Assessment / Reassessment - one wound 1 5 '[]'$  - 0 Complex Wound Assessment / Reassessment - multiple wounds '[]'$  - 0 Dermatologic / Skin Assessment (not related to wound area) ASSESSMENTS - Focused Assessment X - Circumferential Edema Measurements - multi extremities 1 5 '[]'$  - 0 Nutritional Assessment / Counseling / Intervention '[]'$  - 0 Lower Extremity Assessment (monofilament, tuning fork, pulses) '[]'$  - 0 Peripheral Arterial Disease Assessment (using hand held doppler) ASSESSMENTS - Ostomy and/or Continence Assessment and Care '[]'$  - Incontinence Assessment and Management 0 '[]'$  - 0 Ostomy Care Assessment and Management (repouching, etc.) PROCESS - Coordination of Care X - Simple Patient / Family Education for ongoing care 1 15 '[]'$  - 0 Complex (extensive) Patient / Family Education for ongoing care X- 1 10 Staff obtains Consents, Records, Test Results / Process Orders '[]'$  - 0 Staff telephones HHA, Nursing Homes / Clarify orders / etc '[]'$  - 0 Routine Transfer to another Facility (non-emergent condition) '[]'$  - 0 Routine Hospital Admission (non-emergent condition) '[]'$  - 0 New Admissions / Biomedical engineer / Ordering NPWT, Apligraf, etc. '[]'$  - 0 Emergency Hospital Admission (emergent condition) '[]'$  - 0 Simple Discharge Coordination '[]'$  - 0 Complex (extensive) Discharge Coordination PROCESS - Special Needs '[]'$  - Pediatric / Minor Patient Management 0 '[]'$  - 0 Isolation Patient Management '[]'$  - 0 Hearing / Language / Visual special needs '[]'$  - 0 Assessment of Community assistance (transportation, D/C planning, etc.) '[]'$  - 0 Additional assistance / Altered mentation '[]'$  - 0 Support Surface(s) Assessment (bed, cushion, seat, etc.) INTERVENTIONS - Wound Cleansing / Measurement Suess, Quanda ([id number]) X- 1 5 Simple Wound Cleansing - one wound '[]'$  - 0 Complex Wound Cleansing - multiple wounds X- 1 5 Wound Imaging (photographs - any number of wounds) '[]'$  - 0 Wound Tracing (instead of  photographs) X- 1 5 Simple Wound Measurement - one wound '[]'$  - 0 Complex Wound Measurement - multiple wounds INTERVENTIONS - Wound Dressings X -  Small Wound Dressing one or multiple wounds 1 10 '[]'$  - 0 Medium Wound Dressing one or multiple wounds '[]'$  - 0 Large Wound Dressing one or multiple wounds '[]'$  - 0 Application of Medications - topical '[]'$  - 0 Application of Medications - injection INTERVENTIONS - Miscellaneous '[]'$  - External ear exam 0 '[]'$  - 0 Specimen Collection (cultures, biopsies, blood, body fluids, etc.) '[]'$  - 0 Specimen(s) / Culture(s) sent or taken to Lab for analysis '[]'$  - 0 Patient Transfer (multiple staff / Civil Service fast streamer / Similar devices) '[]'$  - 0 Simple Staple / Suture removal (25 or less) '[]'$  - 0 Complex Staple / Suture removal (26 or more) '[]'$  - 0 Hypo / Hyperglycemic Management (close monitor of Blood Glucose) '[]'$  - 0 Ankle / Brachial Index (ABI) - do not check if billed separately X- 1 5 Vital Signs Has the patient been seen at the hospital within the last three years: Yes Total Score: 80 Level Of Care: New/Established - Level 3 Electronic Signature(s) Signed: 08/20/2021 4:45:49 PM By: Alycia Rossetti Entered By: Alycia Rossetti on 08/20/2021 16:36:07 Behunin, Mikalyn ([id number]) -------------------------------------------------------------------------------- Encounter Discharge Information Details Patient Name: [name] Date of Service: 08/20/2021 3:00 PM Medical Record Number: [id number] Patient Account Number: [id number] Date of Birth/Sex: [birth year] (56 y.o. F) Treating RN: Alycia Rossetti Primary Care [name] Ketner: Laurance Flatten Other Clinician: Referring Desirey Keahey: Laurance Flatten Treating Alexarae Oliva/Extender: Yaakov Guthrie in Treatment: 15 Encounter Discharge Information Items Discharge Condition: Stable Ambulatory Status: Cane Discharge Destination: Home Transportation: Private Auto Accompanied By: daughter Schedule Follow-up Appointment: Yes Clinical  Summary of Care: Electronic Signature(s) Signed: 08/20/2021 4:38:39 PM By: Alycia Rossetti Entered By: Alycia Rossetti on 08/20/2021 16:38:39 Tisby, Thomasenia ([id number]) -------------------------------------------------------------------------------- Lower Extremity Assessment Details Patient Name: [name] Date of Service: 08/20/2021 3:00 PM Medical Record Number: [id number] Patient Account Number: [id number] Date of Birth/Sex: [birth year] (55 y.o. F) Treating RN: Alycia Rossetti Primary Care Yedidya Duddy: Laurance Flatten Other Clinician: Referring Inola Lisle: Laurance Flatten Treating Kharlie Bring/Extender: Yaakov Guthrie in Treatment: 15 Edema Assessment Assessed: [Left: No] Patrice Paradise: No] Edema: [Left: Ye] [Right: s] Calf Left: Right: Point of Measurement: 36 cm From Medial Instep 45.5 cm Ankle Left: Right: Point of Measurement: 10 cm From Medial Instep 24.5 cm Vascular Assessment Pulses: Dorsalis Pedis Palpable: [Right:Yes] Electronic Signature(s) Signed: 08/20/2021 4:33:10 PM By: Alycia Rossetti Previous Signature: 08/20/2021 4:32:23 PM Version By: Alycia Rossetti Entered By: Alycia Rossetti on 08/20/2021 16:33:10 Schnieders, Jadee ([id number]) -------------------------------------------------------------------------------- Multi Wound Chart Details Patient Name: [name] Date of Service: 08/20/2021 3:00 PM Medical Record Number: [id number] Patient Account Number: [id number] Date of Birth/Sex: 04-07-1965 (55 y.o. 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 N/A Extremity Extremity Comorbid History: Sleep Apnea, Congestive Heart Sleep Apnea, Congestive Heart N/A Failure, Coronary Artery Disease, Failure, Coronary Artery Disease, Hypertension, Myocardial Infarction, Hypertension, Myocardial Infarction, Peripheral Venous Disease, Type II Peripheral Venous Disease, Type II Diabetes, End Stage Renal Disease, Diabetes, End Stage Renal Disease, Gout, Neuropathy Gout, Neuropathy Date Acquired: 08/05/2021 07/20/2021 N/A Weeks of Treatment: 15 4 N/A Wound Status: Open Healed - Epithelialized N/A Wound Recurrence: Yes No N/A Clustered Wound: Yes No N/A Clustered Quantity: 2 N/A  N/A Pending Amputation on Yes No N/A Presentation: Measurements L x W x D (cm) 0.2x0.9x0.1 0x0x0 N/A Area (cm) : 0.141 0 N/A Volume (cm) : 0.014 0 N/A % Reduction in Area: 96.10% 100.00% N/A % Reduction in Volume: 96.10% 100.00% N/A Position 1 (o'clock): 9 Maximum Distance 1 (cm): 1.3 Tunneling: Yes N/A N/A Classification: Grade 2 Grade 1 N/A Exudate Amount: Medium Medium N/A Exudate Type: Serosanguineous Serosanguineous N/A Exudate Color: red, brown red, brown N/A Granulation Amount: Small (1-33%) Large (67-100%) N/A Granulation Quality: Red, Pink Red N/A Necrotic Amount: Large (67-100%) Small (1-33%) N/A Exposed Structures: Fat Layer (Subcutaneous Tissue): Fat Layer (Subcutaneous Tissue): N/A Yes Yes Fascia: No Tendon: No Muscle: No Joint: No Bone: No Epithelialization: Small (1-33%) Small (1-33%) N/A Derusha, Blondell ([id number]) Treatment Notes Electronic Signature(s) Signed: 08/20/2021 4:33:51 PM By: Alycia Rossetti Entered By: Alycia Rossetti on 08/20/2021 16:33:51 Brosseau, Zanovia ([id number]) -------------------------------------------------------------------------------- [place] Details Patient Name: [name] Date of Service: 08/20/2021 3:00 PM Medical  Record Number: [id number] Patient Account Number: [id number] Date of Birth/Sex: 16-Mar-1966 (56 y.o. F) Treating RN: Alycia Rossetti Primary Care Tongela Encinas: Laurance Flatten Other Clinician: Referring Sneijder Bernards: Laurance Flatten Treating Jasalyn Frysinger/Extender: Yaakov Guthrie in Treatment: 15 Active Inactive Wound/Skin Impairment Nursing Diagnoses: Impaired tissue integrity Knowledge deficit related to ulceration/compromised skin integrity Goals: Ulcer/skin breakdown will have a volume reduction of 30% by week 4 Date Initiated: 05/07/2021 Target Resolution Date: 06/04/2021 Goal Status: Active Ulcer/skin breakdown will have a volume reduction of 50% by week 8 Date Initiated: 05/07/2021 Target Resolution Date: 07/02/2021 Goal Status: Active Ulcer/skin breakdown will have a volume reduction of 80% by week 12 Date Initiated: 05/07/2021 Target Resolution Date: 07/30/2021 Goal Status: Active Ulcer/skin breakdown will heal within 14 weeks Date Initiated: 05/07/2021 Target Resolution Date: 08/13/2021 Goal Status: Active Interventions: Assess patient/caregiver ability to obtain necessary supplies Assess patient/caregiver ability to perform ulcer/skin care regimen upon admission and as needed Assess ulceration(s) every visit Provide education on ulcer and skin care Notes: Electronic Signature(s) Signed: 08/20/2021 4:33:35 PM By: Alycia Rossetti Entered By: Alycia Rossetti on 08/20/2021 16:33:34 Maranto, Rhodie ([id number]) -------------------------------------------------------------------------------- Pain Assessment Details Patient Name: [name] Date of Service: 08/20/2021 3:00 PM Medical Record Number: [id number] Patient Account Number: [id number] Date of Birth/Sex: 08/08/1965 (56 y.o. F) Treating RN: Alycia Rossetti Primary Care Maygan Koeller: Laurance Flatten Other Clinician: Referring Juvenal Umar: Laurance Flatten Treating Demi Trieu/Extender: Yaakov Guthrie in Treatment: 15 Active Problems Location of Pain  Severity and Description of Pain Patient Has Paino No Site Locations Pain Management and Medication Current Pain Management: Electronic Signature(s) Signed: 08/20/2021 4:31:03 PM By: Alycia Rossetti Entered By: Alycia Rossetti on 08/20/2021 16:31:02 [place], [place] ([id number]) -------------------------------------------------------------------------------- Patient/Caregiver Education Details Patient Name: [name] Date of Service: 08/20/2021 3:00 PM Medical Record Number: [id number] Patient Account Number: [id number] Date of Birth/Gender: April 14, 1965 (55 y.o. F) Treating RN: Alycia Rossetti Primary Care Physician: Laurance Flatten Other Clinician: Referring Physician: Laurance Flatten Treating Physician/Extender: Yaakov Guthrie in Treatment: 15 Education Assessment Education Provided To: Patient Education Topics Provided Wound/Skin Impairment: Methods: Demonstration, Explain/Verbal Responses: State content correctly Electronic Signature(s) Signed: 08/20/2021 4:45:49 PM By: Alycia Rossetti Entered By: Alycia Rossetti on 08/20/2021 16:37:17 Kopecky, Finnley ([id number]) -------------------------------------------------------------------------------- Wound Assessment Details Patient Name: [name] Date of Service: 08/20/2021 3:00 PM Medical Record Number: [id number] Patient Account Number: [id number] Date of Birth/Sex: Aug 08, 1965 (55 y.o. F) Treating RN: Alycia Rossetti Primary Care Nathanyal Ashmead: Laurance Flatten Other Clinician: Referring Jakelyn Squyres: Laurance Flatten Treating Khriz Liddy/Extender: Yaakov Guthrie in Treatment: 15 Wound Status Wound Number: [id number]  Primary Diabetic Wound/Ulcer of the Lower Extremity Etiology: Wound Location: Right, Plantar Foot Wound Open Wounding Event: Surgical Injury Status: Date Acquired: 08/05/2021 Comorbid Sleep Apnea, Congestive Heart Failure, Coronary Artery Weeks Of Treatment: 15 History: Disease, Hypertension, Myocardial Infarction, Peripheral Clustered  Wound: Yes Venous Disease, Type II Diabetes, End Stage Renal Pending Amputation On Presentation Disease, Gout, Neuropathy Photos Wound Measurements Length: (cm) 0.2 Width: (cm) 0.9 Depth: (cm) 0.1 Clustered Quantity: 2 Area: (cm) 0.141 Volume: (cm) 0.014 % Reduction in Area: 96.1% % Reduction in Volume: 96.1% Epithelialization: Small (1-33%) Tunneling: Yes Position (o'clock): 9 Maximum Distance: (cm) 1.3 Wound Description Classification: Grade 2 Exudate Amount: Medium Exudate Type: Serosanguineous Exudate Color: red, brown Foul Odor After Cleansing: No Slough/Fibrino Yes Wound Bed Granulation Amount: Small (1-33%) Exposed Structure Granulation Quality: Red, Pink Fascia Exposed: No Necrotic Amount: Large (67-100%) Fat Layer (Subcutaneous Tissue) Exposed: Yes Necrotic Quality: Adherent Slough Tendon Exposed: No Muscle Exposed: No Joint Exposed: No Bone Exposed: No Treatment Notes Wound #[id number] (Foot) Wound Laterality: Plantar, Right Cleanser Normal Saline Iseminger, Floetta ([id number]) Discharge Instruction: Wash your hands with soap and water. Remove old dressing, discard into plastic bag and place into trash. Cleanse the wound with Normal Saline prior to applying a clean dressing using gauze sponges, not tissues or cotton balls. Do not scrub or use excessive force. Pat dry using gauze sponges, not tissue or cotton balls. Soap and Water Discharge Instruction: Gently cleanse wound with antibacterial soap, rinse and pat dry prior to dressing wounds Peri-Wound Care Topical Gentamicin Discharge Instruction: Apply as directed by Kamaal Cast. Primary Dressing (BORDER) Zetuvit Plus Silicone Border Dressing 4x4 (in/in) Secondary Dressing Secured With Compression Wrap Compression Stockings Add-Ons Electronic Signature(s) Signed: 08/20/2021 4:45:49 PM By: Alycia Rossetti Entered By: Alycia Rossetti on 08/20/2021 15:57:01 Guyett, Charise  ([id number]) -------------------------------------------------------------------------------- Wound Assessment Details Patient Name: [name] Date of Service: 08/20/2021 3:00 PM Medical Record Number: [id number] Patient Account Number: [id number] Date of Birth/Sex: 1965-07-04 (56 y.o. F) Treating RN: Alycia Rossetti Primary Care Crysta Gulick: Laurance Flatten Other Clinician: Referring Zamier Eggebrecht: Laurance Flatten Treating Ason Heslin/Extender: Yaakov Guthrie in Treatment: 15 Wound Status Wound Number: 11 Primary Diabetic Wound/Ulcer of the Lower Extremity Etiology: Wound Location: Right, Medial Lower Leg Wound Healed - Epithelialized Wounding Event: Blister Status: Date Acquired: 07/20/2021 Comorbid Sleep Apnea, Congestive Heart Failure, Coronary Artery Weeks Of Treatment: 4 History: Disease, Hypertension, Myocardial Infarction, Peripheral Clustered Wound: No Venous Disease, Type II Diabetes, End Stage Renal Disease, Gout, Neuropathy Photos Wound Measurements Length: (cm) 0 Width: (cm) 0 Depth: (cm) 0 Area: (cm) 0 Volume: (cm) 0 % Reduction in Area: 100% % Reduction in Volume: 100% Epithelialization: Small (1-33%) Wound Description Classification: Grade 1 Fou Exudate Amount: Medium Slo Exudate Type: Serosanguineous Exudate Color: red, brown l Odor After Cleansing: No ugh/Fibrino Yes Wound Bed Granulation Amount: Large (67-100%) Exposed Structure Granulation Quality: Red Fat Layer (Subcutaneous Tissue) Exposed: Yes Necrotic Amount: Small (1-33%) Treatment Notes Wound #11 (Lower Leg) Wound Laterality: Right, Medial Cleanser Peri-Wound Care Topical Primary Dressing Secondary Dressing Litton, Allis ([id number]) Secured With Compression Wrap Compression Stockings Add-Ons Electronic Signature(s) Signed: 08/20/2021 4:45:49 PM By: Alycia Rossetti Entered By: Alycia Rossetti on 08/20/2021 16:32:01 Haywood, Samreet  ([id number]) -------------------------------------------------------------------------------- Vitals Details Patient Name: [name] Date of Service: 08/20/2021 3:00 PM Medical Record Number: [id number] Patient Account Number: [id number] Date of Birth/Sex: [birth year] (56 y.o. F) Treating RN: Alycia Rossetti Primary Care Adelyn Roscher: Laurance Flatten Other Clinician: Referring Dasani Crear: Laurance Flatten Treating Shakeila Pfarr/Extender: Yaakov Guthrie in Treatment: 15 Vital Signs Time Taken:  13:16 Temperature (F): 98.4 Height (in): 67 Pulse (bpm): 89 Source: Stated Respiratory Rate (breaths/min): 18 Source: Stated Blood Pressure (mmHg): 139/55 Reference Range: 80 - 120 mg / dl Electronic Signature(s) Signed: 08/20/2021 4:30:55 PM By: Alycia Rossetti Entered By: Alycia Rossetti on 08/20/2021 16:30:55

## 2021-08-20 NOTE — Progress Notes (Signed)
[name], [name] ([id number]) Visit Report for 08/20/2021 Chief Complaint Document Details Patient Name: [name], [name] Date of Service: 08/20/2021 3:00 PM Medical Record Number: [id number] Patient Account Number: [id number] Date of Birth/Sex: 09/28/1965 (56 y.o. F) Treating RN: Cornell Barman Primary Care Provider: Laurance Flatten Other Clinician: Referring Provider: Laurance Flatten Treating Provider/Extender: Yaakov Guthrie in Treatment: 15 Information Obtained from: Patient Chief Complaint 05/07/2021; surgical wound dehiscence to the right great toe amputation site. 07/23/2021; patient developed a wound to the anterior right lower extremity limited to skin breakdown from a blister. Electronic Signature(s) Signed: 08/20/2021 4:37:36 PM By: Alycia Rossetti Signed: 08/20/2021 4:42:40 PM By: Kalman Shan DO Previous Signature: 08/20/2021 4:34:21 PM Version By: Kalman Shan DO Entered By: Alycia Rossetti on 08/20/2021 16:37:36 Eye, Ranetta ([id number]) -------------------------------------------------------------------------------- HPI Details Patient Name: [name] Date of Service: 08/20/2021 3:00 PM Medical Record Number: [id number] Patient Account Number: [id number] Date of Birth/Sex: May 12, 1965 (56 y.o. F) Treating RN: Cornell Barman Primary Care Provider: Laurance Flatten Other Clinician: Referring Provider: Laurance Flatten Treating Provider/Extender: Yaakov Guthrie in Treatment: 15 History of Present Illness HPI Description: 10/24/2018 on evaluation today patient presents for initial evaluation or clinic concerning issues that she has been having with lymphedema for quite some time. Unfortunately she has several wound openings at this point that secondary to her lymphedema/venous stasis are giving her trouble and leaking quite severely. She also has diabetes along with hypertension and stage III chronic kidney disease. Fortunately there is no signs of active infection at this time. She is  going to likely require debridement of the left leg ulcer upon evaluation today just based on what I am seeing. Fortunately there is no wound opening on the right. Overall the patient seems to be doing quite well and again there is no evidence of systemic infection which is good news. No fevers, chills, nausea, vomiting, or diarrhea. 10/31/2018 on evaluation today patient actually appears to be doing excellent in regard to her lower extremity ulcers. She has been tolerating the dressing changes without complication. Fortunately there is no signs of active infection at this time. She has tolerated 3 layer compression wrap without complication. 11/07/2018 upon evaluation today patient actually appears to be doing very well with regard to her left lower extremity ulcers. She has been tolerating the dressing changes without complication. Fortunately there is no signs of active infection. No fevers, chills, nausea, vomiting, or diarrhea. 11/21/2018 upon evaluation today patient appears to be doing quite well with regard to her lower extremity ulcers. In fact both areas seem to be showing signs of good improvement which is excellent. She is not having as much pain as she has in the past and again has a lot of healing compared to previous visits as well. 12/05/2018 on evaluation today patient presents for follow-up concerning her ongoing issues with her bilateral lower extremity ulcers. The good news is her right lower extremity is showing signs of completely healing at this time which is great news. Fortunately there is no evidence of active infection. On the left she has just a very small area still remaining that is open at this time all in all she is very close to complete closure in my opinion. She does have compression stockings to wear at home. 12/12/2018 on evaluation today patient appears to be doing excellent in regard to her left lateral lower extremity ulcer. She has been tolerating the dressing  changes without complication. Fortunately there is no signs of active infection at this time. In fact this  appears to be pretty much healed at this point although again she is not 100% today. No fevers, chills, nausea, vomiting, or diarrhea. 12/19/2018 on evaluation today patient actually appears to be doing excellent with regard to her lower extremity ulcer in fact this appears to be completely healed today which is all some. She has done extremely well with wound care measures. No fevers, chills, nausea, vomiting, or diarrhea. ------------------------------------ 11/01/19-Readmission to the clinic Patient presents with left leg pain, wounds posterior calf, onset about 4 weeks, denies any fevers chills or shakes, has not been using anything to these wounds, was given compression stockings at last discharge from the clinic but has not been able to use them as they rolled down and cause creases Patient's history significant for type 2 diabetes insulin requiring, A1c of 6.9 lately, hypertension, chronic pain 8/18; patient readmitted that the clinic last week. She has wounds on her left lateral posterior lower leg all of this in close juxtaposition i.e. a localized site. We've been using silver alginate under 3 layer compression apparently the wound surface area is better. She was wearing compression stockings from [facility] but says they were falling down. As she progresses more towards healing will need to address what we use in terms of compression stockings perhaps external compression garments 11/17/2019 on evaluation today patient appears to be doing well with regard to her legs currently. She does have 2 wounds which are actually measuring much better the more posterior is actually doing very well which I am pleased with the other though smaller is not quite a small but nonetheless on the lateral aspect does seem to be improving. 11/23/2019 on evaluation today patient appears to be doing well  in regard to her wounds. In fact the wound on the posterior aspect of her leg appears healed the lateral aspect is still open but extremely small. 9/15; this is a patient with chronic venous insufficiency and secondary lymphedema. She did not keep her clinic appointment last week and hence the left leg is a lot more swollen since she had to take the wrap off at some point. She has a small weeping area on the left lateral lower leg. She has probably a juxta lite stocking for the left leg I have asked her to bring that in when she comes to her clinic appointment next week at which time she should be healed 10/6; this is a patient with chronic venous insufficiency and secondary lymphedema left greater than right. Skin changes of chronic lymphedema especially in the left leg. She has not been here in 3 weeks. She took the wrap off 2 weeks ago. She has some very old 20/30 stockings from [facility] therapy in [place] she has been using. Fortunately her leg is closed. She also has a single [place] wrap for the left leg Readmission: 06/18/2020 upon evaluation today patient presents for reevaluation here in the clinic. She is a long-term patient that we have seen intermittently when she has had flareups of issues with her lower extremity secondary to chronic venous stasis. With that being said unfortunately today she is having a significant issue with a wound which is actually quite significant over the right lower extremity. She did see dermatology they put Vaseline followed by [place] dressings on her unfortunately she is continuing to have significant issues however here with pain and in fact this wrap was extremely stuck to her upon evaluation today. Nonetheless I believe that she is going to require a little bit different  approach to try to keep things from sticking and hopefully aid in getting this to heal more effectively and quickly. With that being said she does have a history of Keltner, Myleka  ([id number]) chronic venous insufficiency, lymphedema, diabetes mellitus type 2, chronic kidney disease stage III, hypertension, and congestive heart failure. This started as an injury in mid February and has worsened over the past 2 weeks. 07/04/2020 upon evaluation today patient appears to be doing well with regard to her wound on the leg. Overall I am extremely pleased with where things stand and I do think that she is making excellent progress. There is no sign of infection right now which is also great news. I think that she is close to complete resolution. 07/09/20 upon evaluation today patient appears to be doing excellent in regard to her leg ulcer. She has been tolerating the dressing changes without complication and in general I am extremely pleased with where things stand today. There does not appear to be any signs of active infection at this time which is great news. 07/25/2020 upon evaluation today patient appears to be doing well with regard to her wound. She has been tolerating the dressing changes without complication. Fortunately there is no signs of active infection at this time and her wound appears to be completely healed. Readmission: 09/30/2020 upon evaluation today patient appears for reevaluation here in our clinic. She was discharged May 5 that unfortunately has started to have weeping and wounds on the left leg at this point. She tells me the right leg did weep some but nothing as significant as the left. With all that being said at this point the patient states that she figured it was best to come in here and she was having a lot of drainage and it was get all of her sheets and everything. She has been wearing her Velcro wraps sometimes but it does not sound like she is been wearing them all the time which I think is part of the reason why this reopened. She also unfortunately had a fall which was quite significant. This occurred I believe about a week or 2 ago. Otherwise  patient's past medical history really has not changed significantly. 10/07/2020 upon evaluation today patient appears to be doing better in regard to her wounds of the lower extremities. Fortunately there does not appear to be any signs of active infection at this time which is great news. I do not see any evidence of anything worsening but again she continues to have significant bilateral stage III lymphedema. This has been an ongoing issue. We actually have seen her this year pretty much from March through May of 2022. Following this she continued with compression at home following but then unfortunately reopened again. That was on July 11. With that being said this is an ongoing and recurrent issue for which honestly have seen her over the past 2 years pretty much consistently. Nonetheless we have had Velcro compression wraps that she uses at home and despite this she continues to have frequent and recurrent ulcerations. 7/27; patient presents for 1 week follow-up. She has no issues or complaints today. She denies signs of infection. 10/21/2020 upon evaluation today patient's leg actually appears to be doing decently well on the left there is nothing open on the right at all and I am pleased in that regard. On the left side I do think that she still has some areas of weeping I believe the compression wrap is still the best way  to go although I am hopeful that we are headed in the right direction as far as getting this area to heal and silver. Upon inspection patient's wound bed actually showed signs of good granulation epithelization at this point. There does not appear to be any evidence of active infection which is great news and overall I am extremely pleased with where things stand. 11/04/2020 upon evaluation today patient appears to be doing well with regard to her left leg. In fact this is getting very close to complete granulation and epithelization at this point I am very pleased with where we  stand. I do not see any signs of infection currently. 11/18/2020 upon evaluation today patient appears to be doing well in regard to her wound. She is in fact doing extremely well considering the fact we did need to see her last week she was apparently sick and missed her appointment. She is therefore had the wrap on for 2 weeks. Fortunately there is no signs of active infection at this time. No fevers, chills, nausea, vomiting, or diarrhea. 11/28/2020 upon evaluation today patient appears to be doing well with regard to her legs. Things are healing nicely and overall I do not see any signs of infection at this time which is great news. No fevers, chills, nausea, vomiting, or diarrhea. With that being said she still has a couple areas that are open and weeping currently. 12/10/2020 upon evaluation today patient appears to be doing well with regard to her wound. She has been tolerating the dressing changes without complication. Fortunately there does not appear to be any signs of active infection at this time. No fevers, chills, nausea, vomiting, or diarrhea. 10/11; patient comes in today with really excellent edema control. Everything appears to be closed in clinic occluding the left lateral. The area on the right anterior has some eschar on the top I did not remove this. She has dry scaly skin which might benefit from ammonium lactate but for now I just discharged her with a moisturizer. The patient is also apparently fractured her right great toe. 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 There was a revision of the site on  03/13/2021. She has been using gauze to the wound site.She reports pain to the medial aspect with drainage. Home health was changing the dressing however wound care visits have expired. She is trying to do the dressing change on her own but reports having trouble with this. She currently denies fever/chills. 2/22; patient presents for follow-up. She reports taking Augmentin for the past week. She reports heartburn from this and would like the solution version. She is on doxycycline prescribed by another provider for the next few months. She currently denies systemic signs of infection. She is scheduled for her MRI On 2/27. She has home health and is changing the dressing with Hydrofera Blue. She is unable to do this daily. She has a family member present to help watch how to do the dressing. 3/1; patient presents for follow-up. She was able to obtain the solution for Augmentin from her pharmacy however there was a short supply and this is on backorder  currently. She continues to take doxycycline as prescribed. She reports improvement in her foot pain. She had her MRI completed. She states she has not scheduled an appointment with vein and vascular. She is doing [employer] with dressing changes. She denies systemic signs of infection. 3/8; patient presents for follow-up. She reports improvement in wound healing. She has been using Hydrofera Blue. She has not been able to get in contact with vein and vascular to schedule an appointment to see her vascular surgeon. She states she has an appointment with her orthopedic surgeon at the end of the month. [name], [name] ([id number]) 3/29; patient has missed her last follow-up schedule two weeks ago. Since then she has been evaluated by Dr. Wilhelmenia Blase, vascular surgery. There is nothing further to do from a vascular perspective. She has adequate blood flow for healing. She missed her appointment with Dr. Doreatha Lew her surgeon yesterday. She completed her  course of Bactrim that I prescribed. She reports minimal drainage from the wound bed. She reports improvement in wound healing. She is using Hydrofera Blue to the wound beds. She states that home health has discharged her. She denies signs of infection. 4/5; patient presents for follow-up. She has been using Hydrofera Blue daily to the wound beds. She denies signs of infection. She has no issues or complaints today. 4/19; patient presents for follow-up. She has been using Hydrofera Blue daily to the wound beds. A small area on the medial aspect of the foot had blistered up and opened. Currently she denies signs of infection. 4/26; patient presents for follow-up. She has been using Hydrofera Blue with no issues. The medial aspect of the foot that had blistered up and opened at previous clinic visit has healed. She denies signs of infection. 5/3; Patient presents for follow-up. She has been trying to use Hydrofera Blue to the wound bed however this is not staying in place very well. Unfortunately she has developed another wound to the right anterior lower extremity. It was previously a blister. There is still devitalized tissue present. She currently denies signs of infection. 5/10; patient presents for follow-up. She states that the compression wrap Slipped down her leg after it was placed last week.. She reports using gentamicin ointment to the foot wound. She currently denies signs of infection. 5/17; patient presents for follow-up. She states that the compression wrap did not stay in place. She reports she had dried skin to the previous wound site on the right bottom foot for which she removed. She states now she has a wound there. She currently denies signs of infection. 5/24; patient presents for follow-up. She has been using gentamicin ointment to the wound beds. She reports improvement in wound healing. She has no issues or complaints today. She denies signs of infection. 5/30; patient  presents for follow-up. She has been using gentamicin ointment to the wound beds. The right anterior leg wound has healed. She now has a small tunnel to the plantar right foot wound. She has not noticed any purulent drainage. Electronic Signature(s) Signed: 08/20/2021 4:34:21 PM By: Kalman Shan DO Entered By: Kalman Shan on 08/20/2021 16:19:29 Speros, [name] ([id number]) -------------------------------------------------------------------------------- Physical Exam Details Patient Name: [name] Date of Service: 08/20/2021 3:00 PM Medical Record Number: [id number] Patient Account Number: [id number] Date of Birth/Sex: 1965-09-19 (55 y.o. F) Treating RN: Cornell Barman Primary Care Provider: Laurance Flatten Other Clinician: Referring Provider: Laurance Flatten Treating Provider/Extender: Yaakov Guthrie in Treatment: 15 Constitutional . Cardiovascular . Psychiatric . Notes Right foot: To the  plantar aspect on the medial side there is a small open wound with granulation tissue present With a tunnel at the 9 o'clock position. Mild maceration to the periwound. Right lower extremity: To the anterior aspect there Is epithelization to the previous wound site. Electronic Signature(s) Signed: 08/20/2021 4:34:21 PM By: Kalman Shan DO Entered By: Kalman Shan on 08/20/2021 16:20:08 [name], [name] ([id number]) -------------------------------------------------------------------------------- Physician Orders Details Patient Name: [name] Date of Service: 08/20/2021 3:00 PM Medical Record Number: [id number] Patient Account Number: [id number] Date of Birth/Sex: [birth year] (55 y.o. F) Treating RN: Alycia Rossetti Primary Care Provider: Laurance Flatten Other Clinician: Referring Provider: Laurance Flatten Treating Provider/Extender: Yaakov Guthrie in Treatment: 15 Verbal / Phone Orders: No Diagnosis Coding Follow-up Appointments o Return Appointment in 1 week. o Nurse Visit  as needed o Other: - [facility] has patient on list to call back and set up appointment to be seen Bathing/ Shower/ Hygiene o Clean wound with Normal Saline or wound cleanser. o Wash wounds with antibacterial soap and water. o May shower with wound dressing protected with water repellent cover or cast protector. o No tub bath. Edema Control - Lymphedema / Segmental Compressive Device / Other o Elevate, Exercise Daily and Avoid Standing for Long Periods of Time. o Elevate leg(s) parallel to the floor when sitting. o DO YOUR BEST to sleep in the bed at night. DO NOT sleep in your recliner. Long hours of sitting in a recliner leads to swelling of the legs and/or potential wounds on your backside. Wound Treatment Wound #10RR - Foot Wound Laterality: Plantar, Right Cleanser: Normal Saline 1 x Per Day/30 Days Discharge Instructions: Wash your hands with soap and water. Remove old dressing, discard into plastic bag and place into trash. Cleanse the wound with Normal Saline prior to applying a clean dressing using gauze sponges, not tissues or cotton balls. Do not scrub or use excessive force. Pat dry using gauze sponges, not tissue or cotton balls. Cleanser: Soap and Water 1 x Per Day/30 Days Discharge Instructions: Gently cleanse wound with antibacterial soap, rinse and pat dry prior to dressing wounds Topical: Gentamicin 1 x Per Day/30 Days Discharge Instructions: Apply as directed by provider. Primary Dressing: (BORDER) Zetuvit Plus Silicone Border Dressing 4x4 (in/in) (Generic) 1 x Per Day/30 Days Patient Medications Allergies: Tylenol PM, diazepam, latex, simvastatin, ciprofloxacin, levofloxacin, Omnicef, Insulins Notifications Medication Indication Start End Bactrim DS 08/20/2021 DOSE 1 - oral 800 mg-160 mg tablet - 1 tablet oral BID x 7 days Electronic Signature(s) Signed: 08/20/2021 4:37:57 PM By: Kalman Shan DO Signed: 08/20/2021 4:45:49 PM By: Alycia Rossetti Previous Signature: 08/20/2021 4:34:21 PM Version By: Kalman Shan DO Previous Signature: 08/20/2021 4:32:44 PM Version By: Kalman Shan DO Entered By: Alycia Rossetti on 08/20/2021 16:34:16 [name], [name] ([id number]) -------------------------------------------------------------------------------- Problem List Details Patient Name: [name] Date of Service: 08/20/2021 3:00 PM Medical Record Number: [id number] Patient Account Number: [id number] Date of Birth/Sex: 12-12-1965 (56 y.o. F) Treating RN: Cornell Barman Primary Care Provider: Laurance Flatten Other Clinician: Referring Provider: Laurance Flatten Treating Provider/Extender: Yaakov Guthrie in Treatment: 15 Active Problems ICD-10 Encounter Code Description Active Date MDM Diagnosis E11.621 Type 2 diabetes mellitus with foot ulcer 05/07/2021 No Yes T81.30XA Disruption of wound, unspecified, initial encounter 05/07/2021 No Yes L97.512 Non-pressure chronic ulcer of other part of right foot with fat layer 05/07/2021 No Yes exposed L97.811 Non-pressure chronic ulcer of other part of right lower leg limited to 07/23/2021 No Yes breakdown of skin M86.9 Osteomyelitis, unspecified 05/07/2021 No Yes  Inactive Problems Resolved Problems Electronic Signature(s) Signed: 08/20/2021 4:37:29 PM By: Alycia Rossetti Signed: 08/20/2021 4:37:57 PM By: Kalman Shan DO Previous Signature: 08/20/2021 4:34:21 PM Version By: Kalman Shan DO Entered By: Alycia Rossetti on 08/20/2021 16:37:29 [name], [name] ([id number]) -------------------------------------------------------------------------------- Progress Note Details Patient Name: [name] Date of Service: 08/20/2021 3:00 PM Medical Record Number: [id number] Patient Account Number: [id number] Date of Birth/Sex: Mar 08, 1966 (56 y.o. F) Treating RN: Cornell Barman Primary Care Provider: Laurance Flatten Other Clinician: Referring Provider: Laurance Flatten Treating Provider/Extender: Yaakov Guthrie in Treatment: 15 Subjective Chief Complaint Information obtained from Patient 05/07/2021; surgical wound dehiscence to the right great toe amputation site. 07/23/2021; patient developed a wound to the anterior right lower extremity limited to skin breakdown from a blister. History of Present Illness (HPI) 10/24/2018 on evaluation today patient presents for initial evaluation or clinic concerning issues that she has been having with lymphedema for quite some time. Unfortunately she has several wound openings at this point that secondary to her lymphedema/venous stasis are giving her trouble and leaking quite severely. She also has diabetes along with hypertension and stage III chronic kidney disease. Fortunately there is no signs of active infection at this time. She is going to likely require debridement of the left leg ulcer upon evaluation today just based on what I am seeing. Fortunately there is no wound opening on the right. Overall the patient seems to be doing quite well and again there is no evidence of systemic infection which is good news. No fevers, chills, nausea, vomiting, or diarrhea. 10/31/2018 on evaluation today patient actually appears to be doing excellent in regard to her lower extremity ulcers. She has been tolerating the dressing changes without complication. Fortunately there is no signs of active infection at this time. She has tolerated 3 layer compression wrap without complication. 11/07/2018 upon evaluation today patient actually appears to be doing very well with regard to her left lower extremity ulcers. She has been tolerating the dressing changes without complication. Fortunately there is no signs of active infection. No fevers, chills, nausea, vomiting, or diarrhea. 11/21/2018 upon evaluation today patient appears to be doing quite well with regard to her lower extremity ulcers. In fact both areas seem to be showing signs of good improvement which is  excellent. She is not having as much pain as she has in the past and again has a lot of healing compared to previous visits as well. 12/05/2018 on evaluation today patient presents for follow-up concerning her ongoing issues with her bilateral lower extremity ulcers. The good news is her right lower extremity is showing signs of completely healing at this time which is great news. Fortunately there is no evidence of active infection. On the left she has just a very small area still remaining that is open at this time all in all she is very close to complete closure in my opinion. She does have compression stockings to wear at home. 12/12/2018 on evaluation today patient appears to be doing excellent in regard to her left lateral lower extremity ulcer. She has been tolerating the dressing changes without complication. Fortunately there is no signs of active infection at this time. In fact this appears to be pretty much healed at this point although again she is not 100% today. No fevers, chills, nausea, vomiting, or diarrhea. 12/19/2018 on evaluation today patient actually appears to be doing excellent with regard to her lower extremity ulcer in fact this appears to be completely healed today which is all some.  She has done extremely well with wound care measures. No fevers, chills, nausea, vomiting, or diarrhea. ------------------------------------ 11/01/19-Readmission to the clinic Patient presents with left leg pain, wounds posterior calf, onset about 4 weeks, denies any fevers chills or shakes, has not been using anything to these wounds, was given compression stockings at last discharge from the clinic but has not been able to use them as they rolled down and cause creases Patient's history significant for type 2 diabetes insulin requiring, A1c of 6.9 lately, hypertension, chronic pain 8/18; patient readmitted that the clinic last week. She has wounds on her left lateral posterior lower leg all of  this in close juxtaposition i.e. a localized site. We've been using silver alginate under 3 layer compression apparently the wound surface area is better. She was wearing compression stockings from [facility] but says they were falling down. As she progresses more towards healing will need to address what we use in terms of compression stockings perhaps external compression garments 11/17/2019 on evaluation today patient appears to be doing well with regard to her legs currently. She does have 2 wounds which are actually measuring much better the more posterior is actually doing very well which I am pleased with the other though smaller is not quite a small but nonetheless on the lateral aspect does seem to be improving. 11/23/2019 on evaluation today patient appears to be doing well in regard to her wounds. In fact the wound on the posterior aspect of her leg appears healed the lateral aspect is still open but extremely small. 9/15; this is a patient with chronic venous insufficiency and secondary lymphedema. She did not keep her clinic appointment last week and hence the left leg is a lot more swollen since she had to take the wrap off at some point. She has a small weeping area on the left lateral lower leg. She has probably a juxta lite stocking for the left leg I have asked her to bring that in when she comes to her clinic appointment next week at which time she should be healed 10/6; this is a patient with chronic venous insufficiency and secondary lymphedema left greater than right. Skin changes of chronic lymphedema especially in the left leg. She has not been here in 3 weeks. She took the wrap off 2 weeks ago. She has some very old 20/30 stockings from [facility] therapy in [place] she has been using. Fortunately her leg is closed. She also has a single [place] wrap for the left leg Readmission: [name], [name] ([id number]) 06/18/2020 upon evaluation today patient presents for reevaluation  here in the clinic. She is a long-term patient that we have seen intermittently when she has had flareups of issues with her lower extremity secondary to chronic venous stasis. With that being said unfortunately today she is having a significant issue with a wound which is actually quite significant over the right lower extremity. She did see dermatology they put Vaseline followed by [place] dressings on her unfortunately she is continuing to have significant issues however here with pain and in fact this wrap was extremely stuck to her upon evaluation today. Nonetheless I believe that she is going to require a little bit different approach to try to keep things from sticking and hopefully aid in getting this to heal more effectively and quickly. With that being said she does have a history of chronic venous insufficiency, lymphedema, diabetes mellitus type 2, chronic kidney disease stage III, hypertension, and congestive heart failure. This started  as an injury in mid February and has worsened over the past 2 weeks. 07/04/2020 upon evaluation today patient appears to be doing well with regard to her wound on the leg. Overall I am extremely pleased with where things stand and I do think that she is making excellent progress. There is no sign of infection right now which is also great news. I think that she is close to complete resolution. 07/09/20 upon evaluation today patient appears to be doing excellent in regard to her leg ulcer. She has been tolerating the dressing changes without complication and in general I am extremely pleased with where things stand today. There does not appear to be any signs of active infection at this time which is great news. 07/25/2020 upon evaluation today patient appears to be doing well with regard to her wound. She has been tolerating the dressing changes without complication. Fortunately there is no signs of active infection at this time and her wound appears to  be completely healed. Readmission: 09/30/2020 upon evaluation today patient appears for reevaluation here in our clinic. She was discharged May 5 that unfortunately has started to have weeping and wounds on the left leg at this point. She tells me the right leg did weep some but nothing as significant as the left. With all that being said at this point the patient states that she figured it was best to come in here and she was having a lot of drainage and it was get all of her sheets and everything. She has been wearing her Velcro wraps sometimes but it does not sound like she is been wearing them all the time which I think is part of the reason why this reopened. She also unfortunately had a fall which was quite significant. This occurred I believe about a week or 2 ago. Otherwise patient's past medical history really has not changed significantly. 10/07/2020 upon evaluation today patient appears to be doing better in regard to her wounds of the lower extremities. Fortunately there does not appear to be any signs of active infection at this time which is great news. I do not see any evidence of anything worsening but again she continues to have significant bilateral stage III lymphedema. This has been an ongoing issue. We actually have seen her this year pretty much from March through May of 2022. Following this she continued with compression at home following but then unfortunately reopened again. That was on July 11. With that being said this is an ongoing and recurrent issue for which honestly have seen her over the past 2 years pretty much consistently. Nonetheless we have had Velcro compression wraps that she uses at home and despite this she continues to have frequent and recurrent ulcerations. 7/27; patient presents for 1 week follow-up. She has no issues or complaints today. She denies signs of infection. 10/21/2020 upon evaluation today patient's leg actually appears to be doing decently well  on the left there is nothing open on the right at all and I am pleased in that regard. On the left side I do think that she still has some areas of weeping I believe the compression wrap is still the best way to go although I am hopeful that we are headed in the right direction as far as getting this area to heal and silver. Upon inspection patient's wound bed actually showed signs of good granulation epithelization at this point. There does not appear to be any evidence of active infection which is great  news and overall I am extremely pleased with where things stand. 11/04/2020 upon evaluation today patient appears to be doing well with regard to her left leg. In fact this is getting very close to complete granulation and epithelization at this point I am very pleased with where we stand. I do not see any signs of infection currently. 11/18/2020 upon evaluation today patient appears to be doing well in regard to her wound. She is in fact doing extremely well considering the fact we did need to see her last week she was apparently sick and missed her appointment. She is therefore had the wrap on for 2 weeks. Fortunately there is no signs of active infection at this time. No fevers, chills, nausea, vomiting, or diarrhea. 11/28/2020 upon evaluation today patient appears to be doing well with regard to her legs. Things are healing nicely and overall I do not see any signs of infection at this time which is great news. No fevers, chills, nausea, vomiting, or diarrhea. With that being said she still has a couple areas that are open and weeping currently. 12/10/2020 upon evaluation today patient appears to be doing well with regard to her wound. She has been tolerating the dressing changes without complication. Fortunately there does not appear to be any signs of active infection at this time. No fevers, chills, nausea, vomiting, or diarrhea. 10/11; patient comes in today with really excellent edema control.  Everything appears to be closed in clinic occluding the left lateral. The area on the right anterior has some eschar on the top I did not remove this. She has dry scaly skin which might benefit from ammonium lactate but for now I just discharged her with a moisturizer. The patient is also apparently fractured her right great toe. 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 There was a revision of the site on 03/13/2021. She has been using gauze to the wound site.She reports pain to the medial aspect with drainage. Home health was changing the dressing however wound care visits have expired. She is trying to do the dressing change on her own but reports having trouble with this. She currently denies fever/chills. 2/22; patient presents for follow-up. She reports taking Augmentin for the past week. She reports heartburn from this and would like the solution version. She is on doxycycline prescribed by another provider for the next few months. She currently denies systemic signs of infection. She is scheduled for her MRI On 2/27. She has home health and is changing the dressing with Hydrofera Blue. She is unable to do this daily. She has a family member present to help watch how to do the dressing. 3/1; patient presents for follow-up. She was able to obtain the solution for Augmentin from her pharmacy however there was a short supply and this is on backorder currently. She continues to take doxycycline as prescribed. She reports improvement in her foot pain. She had her MRI completed. She states she has not scheduled an appointment  with vein and vascular. She is doing [employer] with dressing changes. She [name], [name] ([id number]) denies systemic signs of infection. 3/8; patient presents for  follow-up. She reports improvement in wound healing. She has been using Hydrofera Blue. She has not been able to get in contact with vein and vascular to schedule an appointment to see her vascular surgeon. She states she has an appointment with her orthopedic surgeon at the end of the month. 3/29; patient has missed her last follow-up schedule two weeks ago. Since then she has been evaluated by Dr. Wilhelmenia Blase, vascular surgery. There is nothing further to do from a vascular perspective. She has adequate blood flow for healing. She missed her appointment with Dr. Doreatha Lew her surgeon yesterday. She completed her course of Bactrim that I prescribed. She reports minimal drainage from the wound bed. She reports improvement in wound healing. She is using Hydrofera Blue to the wound beds. She states that home health has discharged her. She denies signs of infection. 4/5; patient presents for follow-up. She has been using Hydrofera Blue daily to the wound beds. She denies signs of infection. She has no issues or complaints today. 4/19; patient presents for follow-up. She has been using Hydrofera Blue daily to the wound beds. A small area on the medial aspect of the foot had blistered up and opened. Currently she denies signs of infection. 4/26; patient presents for follow-up. She has been using Hydrofera Blue with no issues. The medial aspect of the foot that had blistered up and opened at previous clinic visit has healed. She denies signs of infection. 5/3; Patient presents for follow-up. She has been trying to use Hydrofera Blue to the wound bed however this is not staying in place very well. Unfortunately she has developed another wound to the right anterior lower extremity. It was previously a blister. There is still devitalized  tissue present. She currently denies signs of infection. 5/10; patient presents for follow-up. She states that the compression wrap Slipped down her leg after it was placed last week.. She reports using gentamicin ointment to the foot wound. She currently denies signs of infection. 5/17; patient presents for follow-up. She states that the compression wrap did not stay in place. She reports she had dried skin to the previous wound site on the right bottom foot for which she removed. She states now she has a wound there. She currently denies signs of infection. 5/24; patient presents for follow-up. She has been using gentamicin ointment to the wound beds. She reports improvement in wound healing. She has no issues or complaints today. She denies signs of infection. 5/30; patient presents for follow-up. She has been using gentamicin ointment to the wound beds. The right anterior leg wound has healed. She now has a small tunnel to the plantar right foot wound. She has not noticed any purulent drainage. Objective Constitutional Vitals Time Taken: 1:16 PM, Height: 67 in, Source: Stated, Source: Stated, Temperature: 98.4 F, Pulse: 89 bpm, Respiratory Rate: 18 breaths/min, Blood Pressure: 139/55 mmHg. General Notes: Right foot: To the plantar aspect on the medial side there is a small open wound with granulation tissue present With a tunnel at the 9 o'clock position. Mild maceration to the periwound. Right lower extremity: To the anterior aspect there Is epithelization to the previous wound site. Integumentary (Hair, Skin) Wound #10RR status is Open. Original cause of wound was Surgical Injury. The date acquired was: 08/05/2021. The wound has been in treatment 15 weeks. The wound is located on the [place]. The wound measures 0.2cm length x 0.9cm width x 0.1cm depth; 0.141cm^2 area and 0.014cm^3 volume. There is Fat Layer (Subcutaneous Tissue) exposed.  There is tunneling at 9:00 with a maximum  distance of 1.3cm. There is a medium amount of serosanguineous drainage noted. There is small (1-33%) red, pink granulation within the wound bed. There is a large (67- 100%) amount of necrotic tissue within the wound bed including Adherent Slough. Wound #11 status is Healed - Epithelialized. Original cause of wound was Blister. The date acquired was: 07/20/2021. The wound has been in treatment 4 weeks. The wound is located on the Right,Medial Lower Leg. The wound measures 0cm length x 0cm width x 0cm depth; 0cm^2 area and 0cm^3 volume. There is Fat Layer (Subcutaneous Tissue) exposed. There is a medium amount of serosanguineous drainage noted. There is large (67-100%) red granulation within the wound bed. There is a small (1-33%) amount of necrotic tissue within the wound bed. 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 I recommended Vaseline or lotion daily to the previous wound site. The plantar right foot wound is smaller on the surface however I noted a tunnel today. Due to this I will give her antibiotics to address any bioburden in the wound bed That may be causing this. She is also at high risk for amputation due to previous amputation from osteomyelitis in a diabetic. She does not want to take penicillins or fluoroquinolones. I will give her Bactrim. Her creatinine clearance is above 30. She has taken this in the past and tolerated it well. I also recommended continuing gentamicin and now packing it in the tunnel. Follow-up in 1 week. Plan Follow-up Appointments: Return Appointment in 1 week. Nurse Visit as needed Other: - [facility] has  patient on list to call back and set up appointment to be seen Bathing/ Shower/ Hygiene: Clean wound with Normal Saline or wound cleanser. Wash wounds with antibacterial soap and water. May shower with wound dressing protected with water repellent cover or cast protector. No tub bath. Edema Control - Lymphedema / Segmental Compressive Device / Other: Elevate, Exercise Daily and Avoid Standing for Long Periods of Time. Elevate leg(s) parallel to the floor when sitting. DO YOUR BEST to sleep in the bed at night. DO NOT sleep in your recliner. Long hours of sitting in a recliner leads to swelling of the legs and/or potential wounds on your backside. The following medication(s) was prescribed: Bactrim DS oral 800 mg-160 mg tablet 1 1 tablet oral BID x 7 days starting 08/20/2021 WOUND #10RR: - Foot Wound Laterality: Plantar, Right Cleanser: Normal Saline 1 x Per Day/30 Days Discharge Instructions: Wash your hands with soap and water. Remove old dressing, discard into plastic bag and place into trash. Cleanse the wound with Normal Saline prior to applying a clean dressing using gauze sponges, not tissues or cotton balls. Do not scrub or use excessive force. Pat dry using gauze sponges, not tissue or cotton balls. Cleanser: Soap and Water 1 x Per Day/30 Days Discharge Instructions: Gently cleanse wound with antibacterial soap, rinse and pat dry prior to dressing wounds Topical: Gentamicin 1 x Per Day/30 Days Discharge Instructions: Apply as directed by provider. Primary Dressing: (BORDER) Zetuvit Plus Silicone Border Dressing 4x4 (in/in) (Generic) 1 x Per Day/30 Days 1. Bactrim 2. Gentamicin ointment 3. Offloading with surgical shoe 4. Follow-up in  1 week Electronic Signature(s) Signed: 08/20/2021 4:34:21 PM By: Kalman Shan DO Entered By: Kalman Shan on 08/20/2021 16:33:37 [name], [name]  ([id number]) -------------------------------------------------------------------------------- SuperBill Details Patient Name: [name] Date of Service: 08/20/2021 Medical Record Number: [id number] Patient Account Number: [id number] Date of Birth/Sex: 19-May-1965 (55 y.o. F) Treating RN: Cornell Barman Primary Care Provider: Laurance Flatten Other Clinician: Referring Provider: Laurance Flatten Treating Provider/Extender: Yaakov Guthrie in Treatment: 15 Diagnosis Coding ICD-10 Codes Code Description E11.621 Type 2 diabetes mellitus with foot ulcer T81.30XA Disruption of wound, unspecified, initial encounter L97.512 Non-pressure chronic ulcer of other part of right foot with fat layer exposed L97.811 Non-pressure chronic ulcer of other part of right lower leg limited to breakdown of skin M86.9 Osteomyelitis, unspecified Facility Procedures CPT4 Code: 40102725 Description: 99213 - WOUND CARE VISIT-LEV 3 EST PT Modifier: Quantity: 1 Physician Procedures CPT4 Code: 3664403 Description: 47425 - WC PHYS LEVEL 4 - EST PT Modifier: Quantity: 1 CPT4 Code: Description: ICD-10 Diagnosis Description E11.621 Type 2 diabetes mellitus with foot ulcer T81.30XA Disruption of wound, unspecified, initial encounter L97.512 Non-pressure chronic ulcer of other part of right foot with fat layer ex M86.9 Osteomyelitis,  unspecified Modifier: posed Quantity: Electronic Signature(s) Signed: 08/20/2021 4:36:34 PM By: Alycia Rossetti Signed: 08/20/2021 4:37:57 PM By: Kalman Shan DO Previous Signature: 08/20/2021 4:34:21 PM Version By: Kalman Shan DO Entered By: Alycia Rossetti on 08/20/2021 16:36:34

## 2021-08-27 ENCOUNTER — Ambulatory Visit: Payer: Medicare Other | Admitting: Physician Assistant

## 2021-09-03 ENCOUNTER — Ambulatory Visit: Payer: Medicare Other | Admitting: Internal Medicine

## 2021-09-17 ENCOUNTER — Other Ambulatory Visit (HOSPITAL_COMMUNITY): Payer: Self-pay | Admitting: Cardiology

## 2021-09-24 ENCOUNTER — Ambulatory Visit: Payer: Medicare Other | Admitting: Internal Medicine

## 2021-10-04 ENCOUNTER — Other Ambulatory Visit (HOSPITAL_COMMUNITY): Payer: Self-pay | Admitting: Cardiology

## 2021-10-04 DIAGNOSIS — I5022 Chronic systolic (congestive) heart failure: Secondary | ICD-10-CM

## 2021-10-24 ENCOUNTER — Encounter (HOSPITAL_COMMUNITY): Payer: Self-pay

## 2021-10-25 ENCOUNTER — Emergency Department (HOSPITAL_COMMUNITY): Admission: EM | Admit: 2021-10-25 | Payer: Self-pay | Source: Home / Self Care

## 2021-10-25 ENCOUNTER — Inpatient Hospital Stay (HOSPITAL_COMMUNITY): Payer: Medicare Other

## 2021-10-25 ENCOUNTER — Inpatient Hospital Stay (HOSPITAL_COMMUNITY)
Admission: RE | Admit: 2021-10-25 | Discharge: 2021-11-05 | DRG: 280 | Disposition: A | Discharge status: Home Health | Payer: Medicare Other | Attending: Internal Medicine | Admitting: Internal Medicine

## 2021-10-25 DIAGNOSIS — I25110 Atherosclerotic heart disease of native coronary artery with unstable angina pectoris: Secondary | ICD-10-CM | POA: Diagnosis present

## 2021-10-25 DIAGNOSIS — K209 Esophagitis, unspecified without bleeding: Secondary | ICD-10-CM | POA: Diagnosis not present

## 2021-10-25 DIAGNOSIS — E785 Hyperlipidemia, unspecified: Secondary | ICD-10-CM | POA: Diagnosis present

## 2021-10-25 DIAGNOSIS — I5032 Chronic diastolic (congestive) heart failure: Secondary | ICD-10-CM | POA: Diagnosis not present

## 2021-10-25 DIAGNOSIS — G4733 Obstructive sleep apnea (adult) (pediatric): Secondary | ICD-10-CM | POA: Diagnosis present

## 2021-10-25 DIAGNOSIS — I252 Old myocardial infarction: Secondary | ICD-10-CM

## 2021-10-25 DIAGNOSIS — N184 Chronic kidney disease, stage 4 (severe): Secondary | ICD-10-CM | POA: Diagnosis present

## 2021-10-25 DIAGNOSIS — L97519 Non-pressure chronic ulcer of other part of right foot with unspecified severity: Secondary | ICD-10-CM | POA: Diagnosis present

## 2021-10-25 DIAGNOSIS — I25700 Atherosclerosis of coronary artery bypass graft(s), unspecified, with unstable angina pectoris: Secondary | ICD-10-CM | POA: Diagnosis present

## 2021-10-25 DIAGNOSIS — I5033 Acute on chronic diastolic (congestive) heart failure: Secondary | ICD-10-CM | POA: Diagnosis present

## 2021-10-25 DIAGNOSIS — E1165 Type 2 diabetes mellitus with hyperglycemia: Secondary | ICD-10-CM | POA: Diagnosis present

## 2021-10-25 DIAGNOSIS — E1122 Type 2 diabetes mellitus with diabetic chronic kidney disease: Secondary | ICD-10-CM | POA: Diagnosis present

## 2021-10-25 DIAGNOSIS — E11621 Type 2 diabetes mellitus with foot ulcer: Secondary | ICD-10-CM | POA: Diagnosis present

## 2021-10-25 DIAGNOSIS — I872 Venous insufficiency (chronic) (peripheral): Secondary | ICD-10-CM | POA: Diagnosis not present

## 2021-10-25 DIAGNOSIS — R0602 Shortness of breath: Secondary | ICD-10-CM | POA: Diagnosis present

## 2021-10-25 DIAGNOSIS — K2100 Gastro-esophageal reflux disease with esophagitis, without bleeding: Secondary | ICD-10-CM | POA: Diagnosis present

## 2021-10-25 DIAGNOSIS — I5022 Chronic systolic (congestive) heart failure: Secondary | ICD-10-CM

## 2021-10-25 DIAGNOSIS — K2970 Gastritis, unspecified, without bleeding: Secondary | ICD-10-CM | POA: Diagnosis present

## 2021-10-25 DIAGNOSIS — S81809A Unspecified open wound, unspecified lower leg, initial encounter: Secondary | ICD-10-CM | POA: Diagnosis not present

## 2021-10-25 DIAGNOSIS — N1411 Contrast-induced nephropathy: Secondary | ICD-10-CM | POA: Diagnosis present

## 2021-10-25 DIAGNOSIS — Z6841 Body Mass Index (BMI) 40.0 and over, adult: Secondary | ICD-10-CM | POA: Diagnosis not present

## 2021-10-25 DIAGNOSIS — I480 Paroxysmal atrial fibrillation: Secondary | ICD-10-CM | POA: Diagnosis present

## 2021-10-25 DIAGNOSIS — K648 Other hemorrhoids: Secondary | ICD-10-CM | POA: Diagnosis present

## 2021-10-25 DIAGNOSIS — Z79899 Other long term (current) drug therapy: Secondary | ICD-10-CM

## 2021-10-25 DIAGNOSIS — D123 Benign neoplasm of transverse colon: Secondary | ICD-10-CM | POA: Diagnosis not present

## 2021-10-25 DIAGNOSIS — I214 Non-ST elevation (NSTEMI) myocardial infarction: Secondary | ICD-10-CM | POA: Diagnosis present

## 2021-10-25 DIAGNOSIS — Z794 Long term (current) use of insulin: Secondary | ICD-10-CM

## 2021-10-25 DIAGNOSIS — Z89411 Acquired absence of right great toe: Secondary | ICD-10-CM | POA: Diagnosis not present

## 2021-10-25 DIAGNOSIS — M109 Gout, unspecified: Secondary | ICD-10-CM | POA: Diagnosis present

## 2021-10-25 DIAGNOSIS — K5730 Diverticulosis of large intestine without perforation or abscess without bleeding: Secondary | ICD-10-CM | POA: Diagnosis present

## 2021-10-25 DIAGNOSIS — I152 Hypertension secondary to endocrine disorders: Secondary | ICD-10-CM | POA: Diagnosis present

## 2021-10-25 DIAGNOSIS — K635 Polyp of colon: Secondary | ICD-10-CM | POA: Diagnosis present

## 2021-10-25 DIAGNOSIS — N179 Acute kidney failure, unspecified: Secondary | ICD-10-CM | POA: Diagnosis present

## 2021-10-25 DIAGNOSIS — E1159 Type 2 diabetes mellitus with other circulatory complications: Secondary | ICD-10-CM | POA: Diagnosis present

## 2021-10-25 DIAGNOSIS — E1169 Type 2 diabetes mellitus with other specified complication: Secondary | ICD-10-CM | POA: Diagnosis present

## 2021-10-25 DIAGNOSIS — I25810 Atherosclerosis of coronary artery bypass graft(s) without angina pectoris: Secondary | ICD-10-CM | POA: Diagnosis present

## 2021-10-25 DIAGNOSIS — Z8249 Family history of ischemic heart disease and other diseases of the circulatory system: Secondary | ICD-10-CM

## 2021-10-25 DIAGNOSIS — I130 Hypertensive heart and chronic kidney disease with heart failure and stage 1 through stage 4 chronic kidney disease, or unspecified chronic kidney disease: Principal | ICD-10-CM | POA: Diagnosis present

## 2021-10-25 DIAGNOSIS — I83015 Varicose veins of right lower extremity with ulcer other part of foot: Secondary | ICD-10-CM | POA: Diagnosis present

## 2021-10-25 DIAGNOSIS — I2729 Other secondary pulmonary hypertension: Secondary | ICD-10-CM | POA: Diagnosis present

## 2021-10-25 DIAGNOSIS — I2510 Atherosclerotic heart disease of native coronary artery without angina pectoris: Secondary | ICD-10-CM | POA: Diagnosis not present

## 2021-10-25 DIAGNOSIS — Z7982 Long term (current) use of aspirin: Secondary | ICD-10-CM

## 2021-10-25 DIAGNOSIS — D631 Anemia in chronic kidney disease: Secondary | ICD-10-CM | POA: Diagnosis present

## 2021-10-25 DIAGNOSIS — D509 Iron deficiency anemia, unspecified: Secondary | ICD-10-CM | POA: Diagnosis present

## 2021-10-25 DIAGNOSIS — D638 Anemia in other chronic diseases classified elsewhere: Secondary | ICD-10-CM | POA: Diagnosis not present

## 2021-10-25 DIAGNOSIS — K2971 Gastritis, unspecified, with bleeding: Secondary | ICD-10-CM | POA: Diagnosis not present

## 2021-10-25 DIAGNOSIS — K449 Diaphragmatic hernia without obstruction or gangrene: Secondary | ICD-10-CM | POA: Diagnosis not present

## 2021-10-25 DIAGNOSIS — I50810 Right heart failure, unspecified: Secondary | ICD-10-CM | POA: Diagnosis not present

## 2021-10-25 DIAGNOSIS — Z91199 Patient's noncompliance with other medical treatment and regimen due to unspecified reason: Secondary | ICD-10-CM

## 2021-10-25 DIAGNOSIS — I5082 Biventricular heart failure: Secondary | ICD-10-CM | POA: Diagnosis present

## 2021-10-25 DIAGNOSIS — T508X5A Adverse effect of diagnostic agents, initial encounter: Secondary | ICD-10-CM | POA: Diagnosis present

## 2021-10-25 DIAGNOSIS — S91301A Unspecified open wound, right foot, initial encounter: Secondary | ICD-10-CM | POA: Diagnosis present

## 2021-10-25 DIAGNOSIS — E119 Type 2 diabetes mellitus without complications: Secondary | ICD-10-CM

## 2021-10-25 DIAGNOSIS — Z955 Presence of coronary angioplasty implant and graft: Secondary | ICD-10-CM

## 2021-10-25 DIAGNOSIS — I1310 Hypertensive heart and chronic kidney disease without heart failure, with stage 1 through stage 4 chronic kidney disease, or unspecified chronic kidney disease: Secondary | ICD-10-CM

## 2021-10-25 LAB — CBC
HCT: 24.6 % — ABNORMAL LOW (ref 36.0–46.0)
Hemoglobin: 8 g/dL — ABNORMAL LOW (ref 12.0–15.0)
MCH: 27.7 pg (ref 26.0–34.0)
MCHC: 32.5 g/dL (ref 30.0–36.0)
MCV: 85.1 fL (ref 80.0–100.0)
Platelets: 194 10*3/uL (ref 150–400)
RBC: 2.89 MIL/uL — ABNORMAL LOW (ref 3.87–5.11)
RDW: 16.3 % — ABNORMAL HIGH (ref 11.5–15.5)
WBC: 7.3 10*3/uL (ref 4.0–10.5)
nRBC: 0 % (ref 0.0–0.2)

## 2021-10-25 LAB — ECHOCARDIOGRAM COMPLETE
AR max vel: 2.32 cm2
AV Area VTI: 2.2 cm2
AV Area mean vel: 2.16 cm2
AV Mean grad: 4 mmHg
AV Peak grad: 7.2 mmHg
Ao pk vel: 1.34 m/s
Area-P 1/2: 4.68 cm2
Height: 67 in
MV VTI: 1.95 cm2
S' Lateral: 3.6 cm
Weight: 5082.93 oz

## 2021-10-25 LAB — TROPONIN I (HIGH SENSITIVITY)
Troponin I (High Sensitivity): 1711 ng/L (ref <18)
Troponin I (High Sensitivity): 1943 ng/L (ref <18)

## 2021-10-25 LAB — COMPREHENSIVE METABOLIC PANEL
ALT: 20 U/L (ref 0–44)
AST: 34 U/L (ref 15–41)
Albumin: 2.8 g/dL — ABNORMAL LOW (ref 3.5–5.0)
Alkaline Phosphatase: 38 U/L (ref 38–126)
Anion gap: 7 (ref 5–15)
BUN: 30 mg/dL — ABNORMAL HIGH (ref 6–20)
CO2: 29 mmol/L (ref 22–32)
Calcium: 9.1 mg/dL (ref 8.9–10.3)
Chloride: 107 mmol/L (ref 98–111)
Creatinine, Ser: 2.13 mg/dL — ABNORMAL HIGH (ref 0.44–1.00)
GFR, Estimated: 27 mL/min — ABNORMAL LOW (ref >60)
Glucose, Bld: 91 mg/dL (ref 70–99)
Potassium: 4 mmol/L (ref 3.5–5.1)
Sodium: 143 mmol/L (ref 135–145)
Total Bilirubin: 0.9 mg/dL (ref 0.3–1.2)
Total Protein: 6.4 g/dL — ABNORMAL LOW (ref 6.5–8.1)

## 2021-10-25 LAB — HEMOGLOBIN A1C
Hgb A1c MFr Bld: 6.9 % — ABNORMAL HIGH (ref 4.8–5.6)
Mean Plasma Glucose: 151.33 mg/dL

## 2021-10-25 LAB — GLUCOSE, CAPILLARY
Glucose-Capillary: 77 mg/dL (ref 70–99)
Glucose-Capillary: 160 mg/dL — ABNORMAL HIGH (ref 70–99)
Glucose-Capillary: 156 mg/dL — ABNORMAL HIGH (ref 70–99)
Glucose-Capillary: 132 mg/dL — ABNORMAL HIGH (ref 70–99)
Glucose-Capillary: 140 mg/dL — ABNORMAL HIGH (ref 70–99)
Glucose-Capillary: 222 mg/dL — ABNORMAL HIGH (ref 70–99)

## 2021-10-25 LAB — HIV ANTIBODY (ROUTINE TESTING W REFLEX): HIV Screen 4th Generation wRfx: NONREACTIVE

## 2021-10-25 LAB — MAGNESIUM: Magnesium: 1.9 mg/dL (ref 1.7–2.4)

## 2021-10-25 LAB — HEPARIN LEVEL (UNFRACTIONATED)
Heparin Unfractionated: 0.61 IU/mL (ref 0.30–0.70)
Heparin Unfractionated: 0.65 IU/mL (ref 0.30–0.70)

## 2021-10-25 LAB — PROTIME-INR
INR: 1.3 — ABNORMAL HIGH (ref 0.8–1.2)
Prothrombin Time: 15.6 seconds — ABNORMAL HIGH (ref 11.4–15.2)

## 2021-10-25 MED ORDER — PERFLUTREN LIPID MICROSPHERE
1.0000 mL | INTRAVENOUS | Status: AC | PRN
  Administered 2021-10-25: 3 mL via INTRAVENOUS

## 2021-10-25 MED ORDER — FUROSEMIDE 10 MG/ML IJ SOLN
40.0000 mg | Freq: Once | INTRAMUSCULAR | Status: AC
  Administered 2021-10-25: 40 mg via INTRAVENOUS
  Filled 2021-10-25: qty 4

## 2021-10-25 MED ORDER — NITROGLYCERIN IN D5W 200-5 MCG/ML-% IV SOLN
0.0000 ug/min | INTRAVENOUS | Status: DC
  Administered 2021-10-25: 15 ug/min via INTRAVENOUS
  Administered 2021-10-26: 20 ug/min via INTRAVENOUS
  Filled 2021-10-25: qty 250

## 2021-10-25 MED ORDER — SENNOSIDES-DOCUSATE SODIUM 8.6-50 MG PO TABS: 1.0000 | ORAL_TABLET | Freq: Every evening | ORAL | Status: DC | PRN

## 2021-10-25 MED ORDER — CITALOPRAM HYDROBROMIDE 20 MG PO TABS
20.0000 mg | ORAL_TABLET | Freq: Every day | ORAL | Status: DC
  Administered 2021-10-25 – 2021-10-26 (×2): 20 mg via ORAL
  Filled 2021-10-25 (×3): qty 1

## 2021-10-25 MED ORDER — ASPIRIN 81 MG PO TBEC
81.0000 mg | DELAYED_RELEASE_TABLET | Freq: Every day | ORAL | Status: DC
  Administered 2021-10-25 – 2021-11-05 (×11): 81 mg via ORAL
  Filled 2021-10-25 (×12): qty 1

## 2021-10-25 MED ORDER — SODIUM CHLORIDE 0.9% FLUSH
3.0000 mL | Freq: Two times a day (BID) | INTRAVENOUS | Status: DC
Start: 2021-10-25 — End: 2021-10-30
  Administered 2021-10-25 – 2021-10-30 (×10): 3 mL via INTRAVENOUS

## 2021-10-25 MED ORDER — ACETAMINOPHEN 325 MG PO TABS
650.0000 mg | ORAL_TABLET | Freq: Four times a day (QID) | ORAL | Status: DC | PRN
  Administered 2021-10-25 – 2021-10-29 (×3): 650 mg via ORAL
  Filled 2021-10-25 (×3): qty 2

## 2021-10-25 MED ORDER — HYDROMORPHONE HCL 1 MG/ML IJ SOLN
0.5000 mg | INTRAMUSCULAR | Status: AC | PRN
  Administered 2021-10-25 (×3): 0.5 mg via INTRAVENOUS
  Filled 2021-10-25 (×3): qty 0.5

## 2021-10-25 MED ORDER — FEBUXOSTAT 40 MG PO TABS
80.0000 mg | ORAL_TABLET | Freq: Every morning | ORAL | Status: DC
  Administered 2021-10-25 – 2021-11-05 (×11): 80 mg via ORAL
  Filled 2021-10-25 (×13): qty 2

## 2021-10-25 MED ORDER — CARVEDILOL 25 MG PO TABS
25.0000 mg | ORAL_TABLET | Freq: Two times a day (BID) | ORAL | Status: DC
  Administered 2021-10-25 – 2021-10-29 (×9): 25 mg via ORAL
  Filled 2021-10-25 (×10): qty 1

## 2021-10-25 MED ORDER — ONDANSETRON HCL 4 MG PO TABS: 4.0000 mg | ORAL_TABLET | Freq: Four times a day (QID) | ORAL | Status: DC | PRN

## 2021-10-25 MED ORDER — PANTOPRAZOLE SODIUM 40 MG PO TBEC
40.0000 mg | DELAYED_RELEASE_TABLET | Freq: Every day | ORAL | Status: DC | PRN
  Administered 2021-10-26: 40 mg via ORAL
  Filled 2021-10-25 (×2): qty 1

## 2021-10-25 MED ORDER — INSULIN ASPART 100 UNIT/ML IJ SOLN
0.0000 [IU] | INTRAMUSCULAR | Status: DC
  Administered 2021-10-25: 2 [IU] via SUBCUTANEOUS
  Administered 2021-10-25 (×2): 1 [IU] via SUBCUTANEOUS
  Administered 2021-10-25: 3 [IU] via SUBCUTANEOUS
  Administered 2021-10-26: 2 [IU] via SUBCUTANEOUS
  Administered 2021-10-26: 3 [IU] via SUBCUTANEOUS

## 2021-10-25 MED ORDER — ROSUVASTATIN CALCIUM 5 MG PO TABS
5.0000 mg | ORAL_TABLET | Freq: Every day | ORAL | Status: DC
  Administered 2021-10-25 – 2021-11-05 (×11): 5 mg via ORAL
  Filled 2021-10-25 (×12): qty 1

## 2021-10-25 MED ORDER — ONDANSETRON HCL 4 MG/2ML IJ SOLN
4.0000 mg | Freq: Four times a day (QID) | INTRAMUSCULAR | Status: DC | PRN
  Administered 2021-10-25 – 2021-10-30 (×5): 4 mg via INTRAVENOUS
  Filled 2021-10-25 (×8): qty 2

## 2021-10-25 MED ORDER — HEPARIN (PORCINE) 25000 UT/250ML-% IV SOLN
2000.0000 [IU]/h | INTRAVENOUS | Status: DC
  Administered 2021-10-25 – 2021-10-26 (×3): 1750 [IU]/h via INTRAVENOUS
  Administered 2021-10-26 – 2021-10-27 (×2): 2000 [IU]/h via INTRAVENOUS
  Filled 2021-10-25 (×4): qty 250

## 2021-10-25 NOTE — Assessment & Plan Note (Signed)
   Managed with sensitive sliding scale 

## 2021-10-25 NOTE — Hospital Course (Signed)
[name] is a 56 y.o. 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

## 2021-10-25 NOTE — Progress Notes (Signed)
ANTICOAGULATION CONSULT NOTE  Pharmacy Consult for heparin Indication: chest pain/ACS  Heparin Dosing Weight: 97.1 kg  Labs: Recent Labs    10/25/21 0146 10/25/21 0727  HGB 8.0*  --   HCT 24.6*  --   PLT 194  --   LABPROT 15.6*  --   INR 1.3*  --   HEPARINUNFRC 0.61 0.65  CREATININE 2.13*  --   TROPONINIHS 1,711*  --     Estimated Creatinine Clearance: 43.5 mL/min (A) (by C-G formula based on SCr of 2.13 mg/dL (H)).  Assessment: 35 yof presenting with NSTEMI, CHF exacerbation, and diabetic foot infection, transferred to [facility] from OSH. Pharmacy consulted to dose heparin - currently infusing, initially started at OSH (started 1725 per charting, drip has not been off at all on transfer per RN). Patient is not on anticoagulation PTA. Hg 9.1, plt 209, SCr at OSH.   Confirmatory heparin level therapeutic at 0.65. Most recent Hgb 8.0 and PLTs 194 stable. No issues with heparin infusion or bleeding per RN.   Goal of Therapy:  Heparin level 0.3-0.7 units/ml Monitor platelets by anticoagulation protocol: Yes   Plan:  Continue heparin at current rate 1750 units/hr  Monitor daily CBC, heparin level, s/sx bleeding while on heparin  F/u Cardiology plans  Eliseo Gum, PharmD PGY1 Pharmacy Resident   10/25/2021  8:12 AM

## 2021-10-25 NOTE — Progress Notes (Signed)
[name]  WJX:[id number] DOB: 06/09/1965 DOA: 10/25/2021 PCP: Jacklynn Ganong, MD    Brief Narrative:  56 year old with a history of CAD status post CABG 2010 with subsequent angioplasty and DES 2130, diastolic CHF, CKD stage IV, DM 2, HTN, HLD, anemia of CKD, OSA not using CPAP, PAD, and depression/anxiety who originally presented to the [facility] ER with complaints of 3 days of intermittent central chest pressure radiating into her left arm associated with intermittent shortness of breath and nausea.  She reported that the symptoms were similar to her prior episodes of ACS.  In the [facility] ER her troponin was noted to be elevated.  CXR was consistent with pulmonary edema with small bilateral pleural effusions.  She was placed on IV heparin and IV nitroglycerin and transferred to [facility] for cardiology evaluation.  Consultants:  [facility] cardiology  Goals of Care:  Code Status: Full Code   DVT prophylaxis: IV heparin   Interim Hx: The patient was interviewed and examined by one of my partners earlier today.  Assessment & Plan:  Acute NSTEMI - CAD s/p CABG 2010, multiple angio/stents Troponin I from OSH 0.143 > 0.203 -continue IV heparin and IV nitroglycerin, aspirin, beta-blocker, and rosuvastatin -cardiology to follow and take to cath lab   Acute exacerbation of chronic diastolic CHF Admitting CXR with evidence of pulmonary edema -monitor with intermittent doses of diuretic  CKD stage IV  baseline creatinine ~2.3  Wound of right foot S/p right first toe amputation - new swollen/blistered area on plantar surface of the foot without open wound or drainage - X-ray unable to exclude osteomyelitis - will obtain MRI right foot without contrast once medically stable otherwise   Hypertension  Continue Coreg  DM2 Follow CBG on SSI -A1c 6.9  Anemia of chronic kidney disease Hemoglobin at baseline  OSA  Has not been using CPAP for a while  Hyperlipidemia associated with  type 2 diabetes mellitus  Continue rosuvastatin  Morbid Obesity - Body mass index is 49.76 kg/m.  Family Communication:  Disposition:     Objective: Blood pressure (!) 119/53, pulse 86, temperature 98.7 F (37.1 C), temperature source Oral, resp. rate 18, height '5\' 7"'$  (1.702 m), weight (!) 144.1 kg, SpO2 100 %.  Intake/Output Summary (Last 24 hours) at 10/25/2021 0749 Last data filed at 10/25/2021 8657 Gross per 24 hour  Intake 114.09 ml  Output 450 ml  Net -335.91 ml   Filed Weights   10/25/21 0100  Weight: (!) 144.1 kg    Examination: The patient was examined by one of my partners earlier today.  CBC: Recent Labs  Lab 10/25/21 0146  WBC 7.3  HGB 8.0*  HCT 24.6*  MCV 85.1  PLT 846   Basic Metabolic Panel: Recent Labs  Lab 10/25/21 0146  NA 143  K 4.0  CL 107  CO2 29  GLUCOSE 91  BUN 30*  CREATININE 2.13*  CALCIUM 9.1  MG 1.9   GFR: Estimated Creatinine Clearance: 43.5 mL/min (A) (by C-G formula based on SCr of 2.13 mg/dL (H)).  Liver Function Tests: Recent Labs  Lab 10/25/21 0146  AST 34  ALT 20  ALKPHOS 38  BILITOT 0.9  PROT 6.4*  ALBUMIN 2.8*    HbA1C: Hgb A1c MFr Bld  Date/Time Value Ref Range Status  10/25/2021 01:46 AM 6.9 (H) 4.8 - 5.6 % Final    Comment:    (NOTE) Pre diabetes:          5.7%-6.4%  Diabetes:              >  6.4%  Glycemic control for   <7.0% adults with diabetes   06/28/2020 02:42 PM 7.5 (H) 4.8 - 5.6 % Final    Comment:    REPEATED TO VERIFY (NOTE) Pre diabetes:          5.7%-6.4%  Diabetes:              >6.4%  Glycemic control for   <7.0% adults with diabetes      Scheduled Meds:  aspirin EC  81 mg Oral Daily   carvedilol  25 mg Oral BID WC   insulin aspart  0-9 Units Subcutaneous Q4H   rosuvastatin  5 mg Oral Daily   sodium chloride flush  3 mL Intravenous Q12H   Continuous Infusions:  heparin 1,750 Units/hr (10/25/21 0657)   nitroGLYCERIN 15 mcg/min (10/25/21 0136)     LOS: 0 days    Cherene Altes, MD [facility] Office  [phone] Pager - Text Page per Shea [name]  If 7PM-7AM, please contact night-coverage per Amion 10/25/2021, 7:49 AM

## 2021-10-25 NOTE — Progress Notes (Addendum)
Physician notified of troponin level 1711 per lab.

## 2021-10-25 NOTE — Assessment & Plan Note (Signed)
Creatinine 2.00 at OSH improved from baseline of 2.2-2.4.  Continue to monitor.

## 2021-10-25 NOTE — Assessment & Plan Note (Signed)
Continue Coreg 25 mg twice daily.
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
## 2021-10-25 NOTE — Progress Notes (Signed)
  Echocardiogram 2D Echocardiogram has been performed.  [name] 10/25/2021, 12:29 PM

## 2021-10-25 NOTE — Assessment & Plan Note (Signed)
Hemoglobin at baseline.  Continue to monitor.

## 2021-10-25 NOTE — Plan of Care (Signed)
  Problem: Fluid Volume: Goal: Ability to maintain a balanced intake and output will improve Outcome: Progressing   Problem: Metabolic: Goal: Ability to maintain appropriate glucose levels will improve Outcome: Progressing   Problem: Nutritional: Goal: Maintenance of adequate nutrition will improve Outcome: Progressing   

## 2021-10-25 NOTE — Assessment & Plan Note (Addendum)
CAD s/p CABG 2010, multiple angioplasty and stenting Admitted from outside hospital with typical chest pain similar to prior ACS episodes.  Troponin I from OSH 0.143 > 0.203.  Chest pain improving on nitroglycerin drip. -Consult to cardiology -Continue IV heparin -Continue IV nitroglycerin -Cycle troponin -Obtain echocardiogram -Continue aspirin 81 mg -Continue Coreg 25 mg BID -Continue rosuvastatin

## 2021-10-25 NOTE — Assessment & Plan Note (Signed)
Not using CPAP for a while.  Needs updated sleep study.

## 2021-10-25 NOTE — Progress Notes (Signed)
Pt stated she is not ready for CPAP. Pt stated she goes to sleep around 3am.  Pt instructed to have RN call RT when pt is ready for CPAP.
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
## 2021-10-25 NOTE — Progress Notes (Addendum)
[place] for heparin Indication: chest pain/ACS  Heparin Dosing Weight: 97.1 kg  Labs: No results for input(s): "HGB", "HCT", "PLT", "APTT", "LABPROT", "INR", "HEPARINUNFRC", "HEPRLOWMOCWT", "CREATININE", "CKTOTAL", "CKMB", "TROPONINIHS" in the last 72 hours.  CrCl cannot be calculated (Patient's most recent lab result is older than the maximum 21 days allowed.).  Assessment: 46 yof presenting with NSTEMI, CHF exacerbation, and diabetic foot infection, transferred to [facility] from OSH. Pharmacy consulted to dose heparin - currently infusing, initially started at OSH (started 1725 per charting, drip has not been off at all on transfer per RN). Patient is not on anticoagulation PTA. Hg 9.1, plt 209, SCr at OSH. No active bleed issues documented.  Goal of Therapy:  Heparin level 0.3-0.7 units/ml Monitor platelets by anticoagulation protocol: Yes   Plan:  Continue heparin at current rate 1750 units/hr for now Check heparin level now to assess rate Monitor daily CBC, s/sx bleeding F/u Cardiology plans   Arturo Morton, PharmD, BCPS Please check AMION for all [place] contact numbers Clinical Pharmacist 10/25/2021 1:11 AM  ADDENDUM - stat heparin level resulted therapeutic at 0.61. No bleed issues reported. Will recheck level in 6 hrs to confirm.  Arturo Morton, PharmD, BCPS Please check AMION for all [place] contact numbers Clinical Pharmacist 10/25/2021 2:54 AM

## 2021-10-25 NOTE — Assessment & Plan Note (Signed)
Continue rosuvastatin.  

## 2021-10-25 NOTE — Assessment & Plan Note (Signed)
Presenting with dyspnea, elevated proBNP, and CXR with evidence of pulmonary edema.  Body habitus limiting exam but overall appears volume overloaded on admission. -Give IV Lasix 40 mg tonight and monitor response -Strict I/O's and daily weights -Monitor renal function and electrolytes

## 2021-10-25 NOTE — Assessment & Plan Note (Signed)
S/p right first toe amputation.  Has new swollen/blistered area on plantar surface of the foot without open wound or drainage.  X-ray was unable to exclude osteomyelitis. -Obtain MRI right foot without contrast

## 2021-10-25 NOTE — Plan of Care (Signed)
  Problem: Coping: Goal: Ability to adjust to condition or change in health will improve Outcome: Progressing   Problem: Fluid Volume: Goal: Ability to maintain a balanced intake and output will improve Outcome: Progressing   Problem: Nutritional: Goal: Maintenance of adequate nutrition will improve Outcome: Progressing

## 2021-10-26 DIAGNOSIS — I214 Non-ST elevation (NSTEMI) myocardial infarction: Secondary | ICD-10-CM | POA: Diagnosis not present

## 2021-10-26 LAB — HEPARIN LEVEL (UNFRACTIONATED)
Heparin Unfractionated: 0.26 IU/mL — ABNORMAL LOW (ref 0.30–0.70)
Heparin Unfractionated: 0.2 IU/mL — ABNORMAL LOW (ref 0.30–0.70)
Heparin Unfractionated: 0.59 IU/mL (ref 0.30–0.70)

## 2021-10-26 LAB — COMPREHENSIVE METABOLIC PANEL
ALT: 20 U/L (ref 0–44)
AST: 32 U/L (ref 15–41)
Albumin: 2.8 g/dL — ABNORMAL LOW (ref 3.5–5.0)
Alkaline Phosphatase: 35 U/L — ABNORMAL LOW (ref 38–126)
Anion gap: 6 (ref 5–15)
BUN: 34 mg/dL — ABNORMAL HIGH (ref 6–20)
CO2: 30 mmol/L (ref 22–32)
Calcium: 9 mg/dL (ref 8.9–10.3)
Chloride: 101 mmol/L (ref 98–111)
Creatinine, Ser: 2.71 mg/dL — ABNORMAL HIGH (ref 0.44–1.00)
GFR, Estimated: 20 mL/min — ABNORMAL LOW (ref >60)
Glucose, Bld: 236 mg/dL — ABNORMAL HIGH (ref 70–99)
Potassium: 4.6 mmol/L (ref 3.5–5.1)
Sodium: 137 mmol/L (ref 135–145)
Total Bilirubin: 0.6 mg/dL (ref 0.3–1.2)
Total Protein: 6.3 g/dL — ABNORMAL LOW (ref 6.5–8.1)

## 2021-10-26 LAB — GLUCOSE, CAPILLARY
Glucose-Capillary: 178 mg/dL — ABNORMAL HIGH (ref 70–99)
Glucose-Capillary: 185 mg/dL — ABNORMAL HIGH (ref 70–99)
Glucose-Capillary: 188 mg/dL — ABNORMAL HIGH (ref 70–99)
Glucose-Capillary: 202 mg/dL — ABNORMAL HIGH (ref 70–99)
Glucose-Capillary: 231 mg/dL — ABNORMAL HIGH (ref 70–99)
Glucose-Capillary: 238 mg/dL — ABNORMAL HIGH (ref 70–99)

## 2021-10-26 LAB — BASIC METABOLIC PANEL
Anion gap: 7 (ref 5–15)
BUN: 34 mg/dL — ABNORMAL HIGH (ref 6–20)
CO2: 28 mmol/L (ref 22–32)
Calcium: 9.1 mg/dL (ref 8.9–10.3)
Chloride: 104 mmol/L (ref 98–111)
Creatinine, Ser: 2.31 mg/dL — ABNORMAL HIGH (ref 0.44–1.00)
GFR, Estimated: 24 mL/min — ABNORMAL LOW (ref >60)
Glucose, Bld: 264 mg/dL — ABNORMAL HIGH (ref 70–99)
Potassium: 4.2 mmol/L (ref 3.5–5.1)
Sodium: 139 mmol/L (ref 135–145)

## 2021-10-26 LAB — CBC
HCT: 24 % — ABNORMAL LOW (ref 36.0–46.0)
HCT: 24.4 % — ABNORMAL LOW (ref 36.0–46.0)
Hemoglobin: 7.7 g/dL — ABNORMAL LOW (ref 12.0–15.0)
Hemoglobin: 7.9 g/dL — ABNORMAL LOW (ref 12.0–15.0)
MCH: 27.1 pg (ref 26.0–34.0)
MCH: 27.5 pg (ref 26.0–34.0)
MCHC: 32.1 g/dL (ref 30.0–36.0)
MCHC: 32.4 g/dL (ref 30.0–36.0)
MCV: 84.5 fL (ref 80.0–100.0)
MCV: 85 fL (ref 80.0–100.0)
Platelets: 188 10*3/uL (ref 150–400)
Platelets: 190 10*3/uL (ref 150–400)
RBC: 2.84 MIL/uL — ABNORMAL LOW (ref 3.87–5.11)
RBC: 2.87 MIL/uL — ABNORMAL LOW (ref 3.87–5.11)
RDW: 16.1 % — ABNORMAL HIGH (ref 11.5–15.5)
RDW: 16 % — ABNORMAL HIGH (ref 11.5–15.5)
WBC: 5.8 10*3/uL (ref 4.0–10.5)
WBC: 7.5 10*3/uL (ref 4.0–10.5)
nRBC: 0.3 % — ABNORMAL HIGH (ref 0.0–0.2)
nRBC: 0.4 % — ABNORMAL HIGH (ref 0.0–0.2)

## 2021-10-26 MED ORDER — HYDROMORPHONE HCL 1 MG/ML IJ SOLN
INTRAMUSCULAR | Status: AC
  Filled 2021-10-26: qty 0.5

## 2021-10-26 MED ORDER — SODIUM CHLORIDE 0.9 % IV SOLN: 250.0000 mL | INTRAVENOUS | Status: DC | PRN

## 2021-10-26 MED ORDER — SODIUM CHLORIDE 0.9 % WEIGHT BASED INFUSION
1.0000 mL/kg/h | INTRAVENOUS | Status: DC
  Administered 2021-10-27: 1 mL/kg/h via INTRAVENOUS

## 2021-10-26 MED ORDER — SODIUM CHLORIDE 0.9% FLUSH: 3.0000 mL | INTRAVENOUS | Status: DC | PRN

## 2021-10-26 MED ORDER — SODIUM CHLORIDE 0.9 % IV SOLN: INTRAVENOUS | Status: AC

## 2021-10-26 MED ORDER — SODIUM CHLORIDE 0.9% FLUSH
3.0000 mL | Freq: Two times a day (BID) | INTRAVENOUS | Status: DC
  Administered 2021-10-27: 3 mL via INTRAVENOUS

## 2021-10-26 MED ORDER — HYDROMORPHONE HCL 1 MG/ML IJ SOLN
0.5000 mg | Freq: Once | INTRAMUSCULAR | Status: AC
  Administered 2021-10-26: 0.5 mg via INTRAVENOUS

## 2021-10-26 MED ORDER — OXYCODONE HCL 5 MG PO TABS
5.0000 mg | ORAL_TABLET | ORAL | Status: DC | PRN
  Administered 2021-10-26 – 2021-11-04 (×9): 10 mg via ORAL
  Filled 2021-10-26 (×9): qty 2

## 2021-10-26 MED ORDER — INSULIN ASPART 100 UNIT/ML IJ SOLN
0.0000 [IU] | Freq: Three times a day (TID) | INTRAMUSCULAR | Status: DC
  Administered 2021-10-27: 2 [IU] via SUBCUTANEOUS
  Administered 2021-10-27: 8 [IU] via SUBCUTANEOUS
  Administered 2021-10-28: 5 [IU] via SUBCUTANEOUS
  Administered 2021-10-28: 3 [IU] via SUBCUTANEOUS
  Administered 2021-10-28: 8 [IU] via SUBCUTANEOUS
  Administered 2021-10-29 (×2): 3 [IU] via SUBCUTANEOUS
  Administered 2021-10-29: 5 [IU] via SUBCUTANEOUS
  Administered 2021-10-30: 3 [IU] via SUBCUTANEOUS
  Administered 2021-10-30 – 2021-10-31 (×5): 5 [IU] via SUBCUTANEOUS
  Administered 2021-11-01 – 2021-11-02 (×3): 3 [IU] via SUBCUTANEOUS
  Administered 2021-11-02 (×2): 2 [IU] via SUBCUTANEOUS
  Administered 2021-11-03 – 2021-11-04 (×4): 3 [IU] via SUBCUTANEOUS
  Administered 2021-11-04: 5 [IU] via SUBCUTANEOUS
  Administered 2021-11-04: 2 [IU] via SUBCUTANEOUS
  Administered 2021-11-05 (×2): 3 [IU] via SUBCUTANEOUS

## 2021-10-26 MED ORDER — SENNOSIDES-DOCUSATE SODIUM 8.6-50 MG PO TABS
1.0000 | ORAL_TABLET | Freq: Two times a day (BID) | ORAL | Status: DC
  Administered 2021-10-27 – 2021-11-03 (×9): 1 via ORAL
  Filled 2021-10-26 (×13): qty 1

## 2021-10-26 MED ORDER — POLYETHYLENE GLYCOL 3350 17 G PO PACK: 17.0000 g | PACK | Freq: Every day | ORAL | Status: DC | PRN

## 2021-10-26 MED ORDER — SODIUM CHLORIDE 0.9 % WEIGHT BASED INFUSION: 3.0000 mL/kg/h | INTRAVENOUS | Status: DC

## 2021-10-26 MED ORDER — ASPIRIN 81 MG PO CHEW
81.0000 mg | CHEWABLE_TABLET | ORAL | Status: AC
  Administered 2021-10-27: 81 mg via ORAL
  Filled 2021-10-26: qty 1

## 2021-10-26 MED ORDER — INSULIN ASPART 100 UNIT/ML IJ SOLN
0.0000 [IU] | Freq: Every day | INTRAMUSCULAR | Status: DC
  Administered 2021-10-27: 2 [IU] via SUBCUTANEOUS
  Administered 2021-10-28: 3 [IU] via SUBCUTANEOUS
  Administered 2021-10-29: 4 [IU] via SUBCUTANEOUS
  Administered 2021-10-31 – 2021-11-04 (×3): 2 [IU] via SUBCUTANEOUS

## 2021-10-26 MED ORDER — TRAMADOL HCL 50 MG PO TABS
50.0000 mg | ORAL_TABLET | Freq: Four times a day (QID) | ORAL | Status: DC | PRN
  Administered 2021-10-26 – 2021-10-29 (×3): 50 mg via ORAL
  Filled 2021-10-26 (×3): qty 1

## 2021-10-26 MED ORDER — HEPARIN BOLUS VIA INFUSION
2000.0000 [IU] | Freq: Once | INTRAVENOUS | Status: AC
  Administered 2021-10-26: 2000 [IU] via INTRAVENOUS
  Filled 2021-10-26: qty 2000

## 2021-10-26 MED ORDER — INSULIN DETEMIR 100 UNIT/ML ~~LOC~~ SOLN
12.0000 [IU] | Freq: Two times a day (BID) | SUBCUTANEOUS | Status: DC
  Administered 2021-10-26 – 2021-10-28 (×5): 12 [IU] via SUBCUTANEOUS
  Filled 2021-10-26 (×9): qty 0.12

## 2021-10-26 NOTE — Progress Notes (Signed)
Progress Note  Patient Name: [name] Date of Encounter: 10/26/2021  Primary Cardiologist:   None   Subjective   Pain has been mild 2/10.  No SOB.   Inpatient Medications    Scheduled Meds:  aspirin EC  81 mg Oral Daily   carvedilol  25 mg Oral BID WC   citalopram  20 mg Oral Daily   febuxostat  80 mg Oral q morning   insulin aspart  0-15 Units Subcutaneous TID WC   insulin aspart  0-5 Units Subcutaneous QHS   insulin detemir  12 Units Subcutaneous BID   rosuvastatin  5 mg Oral Daily   sodium chloride flush  3 mL Intravenous Q12H   Continuous Infusions:  heparin 1,900 Units/hr (10/26/21 0438)   nitroGLYCERIN 20 mcg/min (10/26/21 0629)   PRN Meds: acetaminophen, ondansetron **OR** ondansetron (ZOFRAN) IV, pantoprazole, senna-docusate   Vital Signs    Vitals:   10/26/21 0257 10/26/21 0321 10/26/21 0409 10/26/21 0700  BP:   (!) 127/53 (!) 140/53  Pulse: 78 80 79 81  Resp: '14 16 14 12  '$ Temp:   98.1 F (36.7 C) 98.1 F (36.7 C)  TempSrc:   Oral Oral  SpO2: 100% 100% 100% 100%  Weight:      Height:        Intake/Output Summary (Last 24 hours) at 10/26/2021 1223 Last data filed at 10/26/2021 0500 Gross per 24 hour  Intake 908.87 ml  Output 200 ml  Net 708.87 ml   Filed Weights   10/25/21 0100 10/26/21 0200  Weight: (!) 144.1 kg (!) 146.2 kg    Telemetry    NSR - Personally Reviewed  ECG    NA - Personally Reviewed  Physical Exam   GEN: No acute distress.   Neck: No  JVD Cardiac: RRR, no murmurs, rubs, or gallops.  Respiratory: Clear  to auscultation bilaterally. GI: Soft, nontender, non-distended  MS:    Mild left greater than right non pitting edema; No deformity. Neuro:  Nonfocal  Psych: Normal affect   Labs    Chemistry Recent Labs  Lab 10/25/21 0146 10/26/21 0248  NA 143 137  K 4.0 4.6  CL 107 101  CO2 29 30  GLUCOSE 91 236*  BUN 30* 34*  CREATININE 2.13* 2.71*  CALCIUM 9.1 9.0  PROT 6.4* 6.3*  ALBUMIN 2.8* 2.8*  AST 34  32  ALT 20 20  ALKPHOS 38 35*  BILITOT 0.9 0.6  GFRNONAA 27* 20*  ANIONGAP 7 6     Hematology Recent Labs  Lab 10/25/21 0146 10/26/21 0248  WBC 7.3 5.8  RBC 2.89* 2.84*  HGB 8.0* 7.7*  HCT 24.6* 24.0*  MCV 85.1 84.5  MCH 27.7 27.1  MCHC 32.5 32.1  RDW 16.3* 16.1*  PLT 194 188    Cardiac EnzymesNo results for input(s): "TROPONINI" in the last 168 hours. No results for input(s): "TROPIPOC" in the last 168 hours.   BNPNo results for input(s): "BNP", "PROBNP" in the last 168 hours.   DDimer No results for input(s): "DDIMER" in the last 168 hours.   Radiology    ECHOCARDIOGRAM COMPLETE  Result Date: 10/25/2021    ECHOCARDIOGRAM REPORT   Patient Name:   [name] Date of Exam: 10/25/2021 Medical Rec #:  [id number]      Height:       67.0 in Accession #:    [id number]     Weight:       317.7 lb Date of Birth:  05/08/1965  BSA:          2.462 m Patient Age:    56 years       BP:           119/53 mmHg Patient Gender: F              HR:           87 bpm. Exam Location:  Inpatient Procedure: 2D Echo, Cardiac Doppler, Color Doppler and Intracardiac            Opacification Agent Indications:    NSTEMI  History:        Patient has prior history of Echocardiogram examinations, most                 recent 10/02/2018. CAD, Prior CABG; Risk Factors:Diabetes,                 Hypertension, Dyslipidemia and Sleep Apnea. HFpEF. CKD. PAD.  Sonographer:    Clayton Lefort RDCS (AE) Referring Phys: Cleaster Corin PATEL IMPRESSIONS  1. Distal septal, apical hypokinesis with overall preserved LV function.  2. Left ventricular ejection fraction, by estimation, is 55 to 60%. The left ventricle has normal function. The left ventricle demonstrates regional wall motion abnormalities (see scoring diagram/findings for description). There is moderate concentric left ventricular hypertrophy. Left ventricular diastolic parameters are consistent with Grade II diastolic dysfunction (pseudonormalization). Elevated left atrial  pressure.  3. Right ventricular systolic function is normal. The right ventricular size is normal.  4. The mitral valve is normal in structure. No evidence of mitral valve regurgitation. No evidence of mitral stenosis.  5. The aortic valve is tricuspid. Aortic valve regurgitation is not visualized. No aortic stenosis is present.  6. The inferior vena cava is dilated in size with >50% respiratory variability, suggesting right atrial pressure of 8 mmHg. FINDINGS  Left Ventricle: Left ventricular ejection fraction, by estimation, is 55 to 60%. The left ventricle has normal function. The left ventricle demonstrates regional wall motion abnormalities. Definity contrast agent was given IV to delineate the left ventricular endocardial borders. The left ventricular internal cavity size was normal in size. There is moderate concentric left ventricular hypertrophy. Left ventricular diastolic parameters are consistent with Grade II diastolic dysfunction (pseudonormalization). Elevated left atrial pressure. Right Ventricle: The right ventricular size is normal. Right ventricular systolic function is normal. Left Atrium: Left atrial size was normal in size. Right Atrium: Right atrial size was normal in size. Pericardium: There is no evidence of pericardial effusion. Mitral Valve: The mitral valve is normal in structure. No evidence of mitral valve regurgitation. No evidence of mitral valve stenosis. MV peak gradient, 6.2 mmHg. The mean mitral valve gradient is 3.0 mmHg. Tricuspid Valve: The tricuspid valve is normal in structure. Tricuspid valve regurgitation is not demonstrated. No evidence of tricuspid stenosis. Aortic Valve: The aortic valve is tricuspid. Aortic valve regurgitation is not visualized. No aortic stenosis is present. Aortic valve mean gradient measures 4.0 mmHg. Aortic valve peak gradient measures 7.2 mmHg. Aortic valve area, by VTI measures 2.20 cm. Pulmonic Valve: The pulmonic valve was not well visualized.  Pulmonic valve regurgitation is not visualized. No evidence of pulmonic stenosis. Aorta: The aortic root is normal in size and structure. Venous: The inferior vena cava is dilated in size with greater than 50% respiratory variability, suggesting right atrial pressure of 8 mmHg. IAS/Shunts: The interatrial septum was not well visualized. Additional Comments: Distal septal, apical hypokinesis with overall preserved LV function.  LEFT VENTRICLE PLAX  2D LVIDd:         5.00 cm   Diastology LVIDs:         3.60 cm   LV e' medial:    4.65 cm/s LV PW:         1.50 cm   LV E/e' medial:  22.2 LV IVS:        1.50 cm   LV e' lateral:   5.18 cm/s LVOT diam:     2.10 cm   LV E/e' lateral: 19.9 LV SV:         56 LV SV Index:   23 LVOT Area:     3.46 cm  RIGHT VENTRICLE            IVC RV S prime:     5.18 cm/s  IVC diam: 2.20 cm TAPSE (M-mode): 1.6 cm LEFT ATRIUM             Index LA diam:        4.10 cm 1.67 cm/m LA Vol (A2C):   36.8 ml 14.95 ml/m LA Vol (A4C):   49.1 ml 19.95 ml/m LA Biplane Vol: 47.0 ml 19.09 ml/m  AORTIC VALVE AV Area (Vmax):    2.32 cm AV Area (Vmean):   2.16 cm AV Area (VTI):     2.20 cm AV Vmax:           134.00 cm/s AV Vmean:          93.000 cm/s AV VTI:            0.254 m AV Peak Grad:      7.2 mmHg AV Mean Grad:      4.0 mmHg LVOT Vmax:         89.80 cm/s LVOT Vmean:        58.000 cm/s LVOT VTI:          0.161 m LVOT/AV VTI ratio: 0.63  AORTA Ao Root diam: 3.00 cm MITRAL VALVE MV Area (PHT): 4.68 cm     SHUNTS MV Area VTI:   1.95 cm     Systemic VTI:  0.16 m MV Peak grad:  6.2 mmHg     Systemic Diam: 2.10 cm MV Mean grad:  3.0 mmHg MV Vmax:       1.24 m/s MV Vmean:      82.4 cm/s MV Decel Time: 162 msec MV E velocity: 103.00 cm/s MV A velocity: 81.30 cm/s MV E/A ratio:  1.27 Kirk Ruths MD Electronically signed by Kirk Ruths MD Signature Date/Time: 10/25/2021/12:34:35 PM    Final    MR FOOT RIGHT WO CONTRAST  Result Date: 10/25/2021 CLINICAL DATA:  Foot swelling, diabetic, osteomyelitis  suspected. 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 New swollen area at the plantar surface of the right foot. No reported open wound or drainage. EXAM: MRI OF THE RIGHT FOREFOOT WITHOUT CONTRAST TECHNIQUE: Multiplanar, multisequence MR imaging of the right forefoot was performed. No intravenous contrast was administered. COMPARISON:  MRI 05/19/2021.  No relevant radiographs available. FINDINGS: Bones/Joint/Cartilage Stable postsurgical changes status post amputation through the 1st metatarsal neck. The amputation site is sharp without residual bone marrow edema to suggest osteomyelitis. The additional metatarsals and phalanges appear unremarkable. The alignment is normal at the Lisfranc joint. There are mild midfoot degenerative changes without significant joint effusions. Ligaments Intact Lisfranc ligament. The collateral ligaments of the remaining metatarsophalangeal joints are intact. Muscles and Tendons No focal intramuscular fluid collection. 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 Abandoned flexor hallucis longus tendon is mildly thickened distally and abuts plantar fluid collection described below. No significant tenosynovitis. Soft tissues There are superficial fluid collections within the plantar medial aspect of the forefoot which appear to be within the skin. The largest component measures 3.2 x 2.6 x 1.1 cm. There are smaller components more distally which extend into the subcutaneous tissues and abut the abandoned flexor hallucis longus tendon. No other focal fluid collections are identified. Marked subcutaneous edema throughout the dorsal aspect the foot is similar to the previous study. IMPRESSION: 1. New large predominately superficial fluid collection within the plantar medial aspect of forefoot which appears to reflect skin blistering and should be clinically obvious. There is a deeper component which extends towards the  flexor hallucis longus tendon. Infection of these fluid collections not excluded. 2. No evidence of tenosynovitis, septic arthritis or osteomyelitis. The amputation through the 1st metatarsal neck has healed without residual marrow edema. 3. Stable marked subcutaneous edema dorsally without other focal fluid collection. Electronically Signed   By: Richardean Sale M.D.   On: 10/25/2021 09:35    Cardiac Studies   Echo:  See above.   Patient Profile     56 y.o. female with a hx of of poorly-controlled diabetes, obesity, CAD s/p CABG 2010, IDA, HLD, OSA on CPAP, depression, and diastolic CHF,who is being seen 10/25/2021 for the evaluation of NSTEMI at the request of Dr Thereasa Solo.  Assessment & Plan    Non-STEMI:  Cath tomorrow.  Limit dye with CKD.   Might need to be postponed if her creat continues to rise.  This is probably realted to the creat she received at 3 am on 8/5.  Avoiding further diuresis.  We will gently hydrate starting this evening.  Patient is aware that the cath could be cancelled.     Chronic diastolic CHF: As above.  Echo with no new findings.       Right foot pain:  Per the primary team.    CKD IIIB -IV:  See above.   For questions or updates, please contact [place] Please consult [email or web address] for contact info under Cardiology/STEMI.   Signed, Minus Breeding, MD  10/26/2021, 12:23 PM

## 2021-10-26 NOTE — Progress Notes (Signed)
ANTICOAGULATION CONSULT NOTE  Pharmacy Consult for heparin Indication: chest pain/ACS  Heparin Dosing Weight: 97.1 kg  Labs: Recent Labs    10/25/21 0146 10/25/21 0727 10/26/21 0248  HGB 8.0*  --  7.7*  HCT 24.6*  --  24.0*  PLT 194  --  188  LABPROT 15.6*  --   --   INR 1.3*  --   --   HEPARINUNFRC 0.61 0.65 0.26*  CREATININE 2.13*  --   --   TROPONINIHS 1,711* 1,943*  --      Estimated Creatinine Clearance: 43.9 mL/min (A) (by C-G formula based on SCr of 2.13 mg/dL (H)).  Assessment: 4 yof presenting with NSTEMI, CHF exacerbation, and diabetic foot infection, transferred to [facility] from OSH. Pharmacy consulted to dose heparin continued on transfer. Patient is not on anticoagulation.  PM update - heparin level dropped to subtherapeutic at 0.26. Hg down a bit to 7.7, plt WNL. No issues with heparin infusion or bleeding per RN.   Goal of Therapy:  Heparin level 0.3-0.7 units/ml Monitor platelets by anticoagulation protocol: Yes   Plan:  Increase heparin to 1900 units/hr Check 6hr heparin level Monitor daily CBC, s/sx bleeding  Planning cath 8/7   Arturo Morton, PharmD, BCPS Please check AMION for all [place] contact numbers Clinical Pharmacist 10/26/2021 3:40 AM

## 2021-10-26 NOTE — Progress Notes (Signed)
[name]  JYN:[id number] DOB: 1966/03/02 DOA: 10/25/2021 PCP: Jacklynn Ganong, MD    Brief Narrative:  56 year old with a history of CAD status post CABG 2010 with subsequent angioplasty and DES 8657, diastolic CHF, CKD stage IV, DM 2, HTN, HLD, anemia of CKD, OSA not using CPAP, PAD, and depression/anxiety who originally presented to the [facility] ER with complaints of 3 days of intermittent central chest pressure radiating into her left arm associated with intermittent shortness of breath and nausea.  She reported that the symptoms were similar to her prior episodes of ACS.  In the [facility] ER her troponin was noted to be elevated.  CXR was consistent with pulmonary edema with small bilateral pleural effusions.  She was placed on IV heparin and IV nitroglycerin and transferred to [facility] for cardiology evaluation.  Consultants:  [facility] Cardiology  Goals of Care:  Code Status: Full Code   DVT prophylaxis: IV heparin   Interim Hx: No acute events reported overnight.  Vital signs stable.  Afebrile.  Denies shortness of breath nausea or vomiting.  Reports near resolution of her chest discomfort at present.  Assessment & Plan:  Acute NSTEMI - CAD s/p CABG 2010, multiple angio/stents Troponin I from OSH 0.143 > 0.203 -continue IV heparin and IV nitroglycerin, aspirin, beta-blocker, and rosuvastatin - cath lab planned for Monday per cardiology  Acute exacerbation of chronic diastolic CHF Admitting CXR with evidence of pulmonary edema -appears euvolemic on exam today following diuretic dose - Cardiology actually gently hydrating in anticipation of cardiac cath/contrast  CKD stage IV  baseline creatinine ~2.3 -follow with gentle volume expansion  Recent Labs  Lab 10/25/21 0146 10/26/21 0248  CREATININE 2.13* 2.71*    Wound of right foot S/p right first toe amputation - new blistered area on plantar surface of the foot without open wound or drainage - X-ray unable to exclude  osteomyelitis -MRI of foot notes a new superficial fluid collection within the plantar medial aspect of the forefoot consistent with skin blistering with a deeper component extending towards the flexor longus tendon but no evidence of tenosynovitis septic arthritis or osteomyelitis -WBC normal and patient afebrile - exam of foot itself not worrisome - appears to simply be a large/deep blister not convincing of an infection   Hypertension  Continue Coreg -currently on nitro drip -blood pressure reasonably controlled  DM2 A1c 6.9 -CBG modestly elevated -adjust treatment regimen and follow  Anemia of chronic kidney disease Hemoglobin at baseline -follow trend  OSA  Has not been using CPAP for a while -refused again last night while in the hospital  Hyperlipidemia associated with type 2 diabetes mellitus  Continue rosuvastatin  Morbid Obesity - Body mass index is 50.48 kg/m.  Family Communication: No family present at time of exam Disposition: From home -anticipate return home after cardiac cath/cardiac work-up complete   Objective: Blood pressure (!) 127/53, pulse 79, temperature 98.1 F (36.7 C), temperature source Oral, resp. rate 14, height '5\' 7"'$  (1.702 m), weight (!) 146.2 kg, SpO2 100 %.  Intake/Output Summary (Last 24 hours) at 10/26/2021 0802 Last data filed at 10/26/2021 0500 Gross per 24 hour  Intake 908.87 ml  Output 200 ml  Net 708.87 ml    Filed Weights   10/25/21 0100 10/26/21 0200  Weight: (!) 144.1 kg (!) 146.2 kg    Examination: General: No acute respiratory distress Lungs: Clear to auscultation bilaterally -breath sounds distant but no crackles or wheezing Cardiovascular: RRR without murmur Abdomen: Nontender, nondistended,  soft, bowel sounds positive, no rebound, no ascites, no appreciable mass Extremities: Approximate 3 inch length and 1-1/2 inch diameter blister underneath the callus on the plantar aspect of the right foot near the arch without erythema or  calor or purulent discharge -no significant edema bilateral lower extremities   CBC: Recent Labs  Lab 10/25/21 0146 10/26/21 0248  WBC 7.3 5.8  HGB 8.0* 7.7*  HCT 24.6* 24.0*  MCV 85.1 84.5  PLT 194 559    Basic Metabolic Panel: Recent Labs  Lab 10/25/21 0146 10/26/21 0248  NA 143 137  K 4.0 4.6  CL 107 101  CO2 29 30  GLUCOSE 91 236*  BUN 30* 34*  CREATININE 2.13* 2.71*  CALCIUM 9.1 9.0  MG 1.9  --     GFR: Estimated Creatinine Clearance: 34.5 mL/min (A) (by C-G formula based on SCr of 2.71 mg/dL (H)).  Liver Function Tests: Recent Labs  Lab 10/25/21 0146 10/26/21 0248  AST 34 32  ALT 20 20  ALKPHOS 38 35*  BILITOT 0.9 0.6  PROT 6.4* 6.3*  ALBUMIN 2.8* 2.8*     HbA1C: Hgb A1c MFr Bld  Date/Time Value Ref Range Status  10/25/2021 01:46 AM 6.9 (H) 4.8 - 5.6 % Final    Comment:    (NOTE) Pre diabetes:          5.7%-6.4%  Diabetes:              >6.4%  Glycemic control for   <7.0% adults with diabetes   06/28/2020 02:42 PM 7.5 (H) 4.8 - 5.6 % Final    Comment:    REPEATED TO VERIFY (NOTE) Pre diabetes:          5.7%-6.4%  Diabetes:              >6.4%  Glycemic control for   <7.0% adults with diabetes      Scheduled Meds:  aspirin EC  81 mg Oral Daily   carvedilol  25 mg Oral BID WC   citalopram  20 mg Oral Daily   febuxostat  80 mg Oral q morning   insulin aspart  0-9 Units Subcutaneous Q4H   rosuvastatin  5 mg Oral Daily   sodium chloride flush  3 mL Intravenous Q12H   Continuous Infusions:  heparin 1,900 Units/hr (10/26/21 0438)   nitroGLYCERIN 20 mcg/min (10/26/21 0629)     LOS: 1 day   Cherene Altes, MD [facility] Office  [phone] Pager - Text Page per Shea [name]  If 7PM-7AM, please contact night-coverage per Amion 10/26/2021, 8:02 AM

## 2021-10-26 NOTE — H&P (View-Only) (Signed)
Progress Note  Patient Name: [name] Date of Encounter: 10/26/2021  Primary Cardiologist:   None   Subjective   Pain has been mild 2/10.  No SOB.   Inpatient Medications    Scheduled Meds:  aspirin EC  81 mg Oral Daily   carvedilol  25 mg Oral BID WC   citalopram  20 mg Oral Daily   febuxostat  80 mg Oral q morning   insulin aspart  0-15 Units Subcutaneous TID WC   insulin aspart  0-5 Units Subcutaneous QHS   insulin detemir  12 Units Subcutaneous BID   rosuvastatin  5 mg Oral Daily   sodium chloride flush  3 mL Intravenous Q12H   Continuous Infusions:  heparin 1,900 Units/hr (10/26/21 0438)   nitroGLYCERIN 20 mcg/min (10/26/21 0629)   PRN Meds: acetaminophen, ondansetron **OR** ondansetron (ZOFRAN) IV, pantoprazole, senna-docusate   Vital Signs    Vitals:   10/26/21 0257 10/26/21 0321 10/26/21 0409 10/26/21 0700  BP:   (!) 127/53 (!) 140/53  Pulse: 78 80 79 81  Resp: '14 16 14 12  '$ Temp:   98.1 F (36.7 C) 98.1 F (36.7 C)  TempSrc:   Oral Oral  SpO2: 100% 100% 100% 100%  Weight:      Height:        Intake/Output Summary (Last 24 hours) at 10/26/2021 1223 Last data filed at 10/26/2021 0500 Gross per 24 hour  Intake 908.87 ml  Output 200 ml  Net 708.87 ml   Filed Weights   10/25/21 0100 10/26/21 0200  Weight: (!) 144.1 kg (!) 146.2 kg    Telemetry    NSR - Personally Reviewed  ECG    NA - Personally Reviewed  Physical Exam   GEN: No acute distress.   Neck: No  JVD Cardiac: RRR, no murmurs, rubs, or gallops.  Respiratory: Clear  to auscultation bilaterally. GI: Soft, nontender, non-distended  MS:    Mild left greater than right non pitting edema; No deformity. Neuro:  Nonfocal  Psych: Normal affect   Labs    Chemistry Recent Labs  Lab 10/25/21 0146 10/26/21 0248  NA 143 137  K 4.0 4.6  CL 107 101  CO2 29 30  GLUCOSE 91 236*  BUN 30* 34*  CREATININE 2.13* 2.71*  CALCIUM 9.1 9.0  PROT 6.4* 6.3*  ALBUMIN 2.8* 2.8*  AST 34  32  ALT 20 20  ALKPHOS 38 35*  BILITOT 0.9 0.6  GFRNONAA 27* 20*  ANIONGAP 7 6     Hematology Recent Labs  Lab 10/25/21 0146 10/26/21 0248  WBC 7.3 5.8  RBC 2.89* 2.84*  HGB 8.0* 7.7*  HCT 24.6* 24.0*  MCV 85.1 84.5  MCH 27.7 27.1  MCHC 32.5 32.1  RDW 16.3* 16.1*  PLT 194 188    Cardiac EnzymesNo results for input(s): "TROPONINI" in the last 168 hours. No results for input(s): "TROPIPOC" in the last 168 hours.   BNPNo results for input(s): "BNP", "PROBNP" in the last 168 hours.   DDimer No results for input(s): "DDIMER" in the last 168 hours.   Radiology    ECHOCARDIOGRAM COMPLETE  Result Date: 10/25/2021    ECHOCARDIOGRAM REPORT   Patient Name:   [name] Date of Exam: 10/25/2021 Medical Rec #:  [id number]      Height:       67.0 in Accession #:    [id number]     Weight:       317.7 lb Date of Birth:  May 28, 1965  BSA:          2.462 m Patient Age:    56 years       BP:           119/53 mmHg Patient Gender: F              HR:           87 bpm. Exam Location:  Inpatient Procedure: 2D Echo, Cardiac Doppler, Color Doppler and Intracardiac            Opacification Agent Indications:    NSTEMI  History:        Patient has prior history of Echocardiogram examinations, most                 recent 10/02/2018. CAD, Prior CABG; Risk Factors:Diabetes,                 Hypertension, Dyslipidemia and Sleep Apnea. HFpEF. CKD. PAD.  Sonographer:    Clayton Lefort RDCS (AE) Referring Phys: Cleaster Corin PATEL IMPRESSIONS  1. Distal septal, apical hypokinesis with overall preserved LV function.  2. Left ventricular ejection fraction, by estimation, is 55 to 60%. The left ventricle has normal function. The left ventricle demonstrates regional wall motion abnormalities (see scoring diagram/findings for description). There is moderate concentric left ventricular hypertrophy. Left ventricular diastolic parameters are consistent with Grade II diastolic dysfunction (pseudonormalization). Elevated left atrial  pressure.  3. Right ventricular systolic function is normal. The right ventricular size is normal.  4. The mitral valve is normal in structure. No evidence of mitral valve regurgitation. No evidence of mitral stenosis.  5. The aortic valve is tricuspid. Aortic valve regurgitation is not visualized. No aortic stenosis is present.  6. The inferior vena cava is dilated in size with >50% respiratory variability, suggesting right atrial pressure of 8 mmHg. FINDINGS  Left Ventricle: Left ventricular ejection fraction, by estimation, is 55 to 60%. The left ventricle has normal function. The left ventricle demonstrates regional wall motion abnormalities. Definity contrast agent was given IV to delineate the left ventricular endocardial borders. The left ventricular internal cavity size was normal in size. There is moderate concentric left ventricular hypertrophy. Left ventricular diastolic parameters are consistent with Grade II diastolic dysfunction (pseudonormalization). Elevated left atrial pressure. Right Ventricle: The right ventricular size is normal. Right ventricular systolic function is normal. Left Atrium: Left atrial size was normal in size. Right Atrium: Right atrial size was normal in size. Pericardium: There is no evidence of pericardial effusion. Mitral Valve: The mitral valve is normal in structure. No evidence of mitral valve regurgitation. No evidence of mitral valve stenosis. MV peak gradient, 6.2 mmHg. The mean mitral valve gradient is 3.0 mmHg. Tricuspid Valve: The tricuspid valve is normal in structure. Tricuspid valve regurgitation is not demonstrated. No evidence of tricuspid stenosis. Aortic Valve: The aortic valve is tricuspid. Aortic valve regurgitation is not visualized. No aortic stenosis is present. Aortic valve mean gradient measures 4.0 mmHg. Aortic valve peak gradient measures 7.2 mmHg. Aortic valve area, by VTI measures 2.20 cm. Pulmonic Valve: The pulmonic valve was not well visualized.  Pulmonic valve regurgitation is not visualized. No evidence of pulmonic stenosis. Aorta: The aortic root is normal in size and structure. Venous: The inferior vena cava is dilated in size with greater than 50% respiratory variability, suggesting right atrial pressure of 8 mmHg. IAS/Shunts: The interatrial septum was not well visualized. Additional Comments: Distal septal, apical hypokinesis with overall preserved LV function.  LEFT VENTRICLE PLAX  2D LVIDd:         5.00 cm   Diastology LVIDs:         3.60 cm   LV e' medial:    4.65 cm/s LV PW:         1.50 cm   LV E/e' medial:  22.2 LV IVS:        1.50 cm   LV e' lateral:   5.18 cm/s LVOT diam:     2.10 cm   LV E/e' lateral: 19.9 LV SV:         56 LV SV Index:   23 LVOT Area:     3.46 cm  RIGHT VENTRICLE            IVC RV S prime:     5.18 cm/s  IVC diam: 2.20 cm TAPSE (M-mode): 1.6 cm LEFT ATRIUM             Index LA diam:        4.10 cm 1.67 cm/m LA Vol (A2C):   36.8 ml 14.95 ml/m LA Vol (A4C):   49.1 ml 19.95 ml/m LA Biplane Vol: 47.0 ml 19.09 ml/m  AORTIC VALVE AV Area (Vmax):    2.32 cm AV Area (Vmean):   2.16 cm AV Area (VTI):     2.20 cm AV Vmax:           134.00 cm/s AV Vmean:          93.000 cm/s AV VTI:            0.254 m AV Peak Grad:      7.2 mmHg AV Mean Grad:      4.0 mmHg LVOT Vmax:         89.80 cm/s LVOT Vmean:        58.000 cm/s LVOT VTI:          0.161 m LVOT/AV VTI ratio: 0.63  AORTA Ao Root diam: 3.00 cm MITRAL VALVE MV Area (PHT): 4.68 cm     SHUNTS MV Area VTI:   1.95 cm     Systemic VTI:  0.16 m MV Peak grad:  6.2 mmHg     Systemic Diam: 2.10 cm MV Mean grad:  3.0 mmHg MV Vmax:       1.24 m/s MV Vmean:      82.4 cm/s MV Decel Time: 162 msec MV E velocity: 103.00 cm/s MV A velocity: 81.30 cm/s MV E/A ratio:  1.27 Kirk Ruths MD Electronically signed by Kirk Ruths MD Signature Date/Time: 10/25/2021/12:34:35 PM    Final    MR FOOT RIGHT WO CONTRAST  Result Date: 10/25/2021 CLINICAL DATA:  Foot swelling, diabetic, osteomyelitis  suspected. 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 New swollen area at the plantar surface of the right foot. No reported open wound or drainage. EXAM: MRI OF THE RIGHT FOREFOOT WITHOUT CONTRAST TECHNIQUE: Multiplanar, multisequence MR imaging of the right forefoot was performed. No intravenous contrast was administered. COMPARISON:  MRI 05/19/2021.  No relevant radiographs available. FINDINGS: Bones/Joint/Cartilage Stable postsurgical changes status post amputation through the 1st metatarsal neck. The amputation site is sharp without residual bone marrow edema to suggest osteomyelitis. The additional metatarsals and phalanges appear unremarkable. The alignment is normal at the Lisfranc joint. There are mild midfoot degenerative changes without significant joint effusions. Ligaments Intact Lisfranc ligament. The collateral ligaments of the remaining metatarsophalangeal joints are intact. Muscles and Tendons No focal intramuscular fluid collection. 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 Abandoned flexor hallucis longus tendon is mildly thickened distally and abuts plantar fluid collection described below. No significant tenosynovitis. Soft tissues There are superficial fluid collections within the plantar medial aspect of the forefoot which appear to be within the skin. The largest component measures 3.2 x 2.6 x 1.1 cm. There are smaller components more distally which extend into the subcutaneous tissues and abut the abandoned flexor hallucis longus tendon. No other focal fluid collections are identified. Marked subcutaneous edema throughout the dorsal aspect the foot is similar to the previous study. IMPRESSION: 1. New large predominately superficial fluid collection within the plantar medial aspect of forefoot which appears to reflect skin blistering and should be clinically obvious. There is a deeper component which extends towards the  flexor hallucis longus tendon. Infection of these fluid collections not excluded. 2. No evidence of tenosynovitis, septic arthritis or osteomyelitis. The amputation through the 1st metatarsal neck has healed without residual marrow edema. 3. Stable marked subcutaneous edema dorsally without other focal fluid collection. Electronically Signed   By: Richardean Sale M.D.   On: 10/25/2021 09:35    Cardiac Studies   Echo:  See above.   Patient Profile     56 y.o. female with a hx of of poorly-controlled diabetes, obesity, CAD s/p CABG 2010, IDA, HLD, OSA on CPAP, depression, and diastolic CHF,who is being seen 10/25/2021 for the evaluation of NSTEMI at the request of Dr Thereasa Solo.  Assessment & Plan    Non-STEMI:  Cath tomorrow.  Limit dye with CKD.   Might need to be postponed if her creat continues to rise.  This is probably realted to the creat she received at 3 am on 8/5.  Avoiding further diuresis.  We will gently hydrate starting this evening.  Patient is aware that the cath could be cancelled.     Chronic diastolic CHF: As above.  Echo with no new findings.       Right foot pain:  Per the primary team.    CKD IIIB -IV:  See above.   For questions or updates, please contact [place] Please consult [email or web address] for contact info under Cardiology/STEMI.   Signed, Minus Breeding, MD  10/26/2021, 12:23 PM

## 2021-10-26 NOTE — Progress Notes (Signed)
ANTICOAGULATION CONSULT NOTE  Pharmacy Consult for heparin Indication: chest pain/ACS  Heparin Dosing Weight: 97.1 kg  Labs: Recent Labs    10/25/21 0146 10/25/21 0727 10/26/21 0248 10/26/21 1059  HGB 8.0*  --  7.7*  --   HCT 24.6*  --  24.0*  --   PLT 194  --  188  --   LABPROT 15.6*  --   --   --   INR 1.3*  --   --   --   HEPARINUNFRC 0.61 0.65 0.26* 0.20*  CREATININE 2.13*  --  2.71*  --   TROPONINIHS 1,711* 1,943*  --   --      Estimated Creatinine Clearance: 34.5 mL/min (A) (by C-G formula based on SCr of 2.71 mg/dL (H)).  Assessment: 1 yof presenting with NSTEMI, CHF exacerbation, and diabetic foot infection, transferred to [facility] from OSH. Pharmacy consulted to dose heparin continued on transfer. Patient is not on anticoagulation.   HL 0.20 on heparin 1900 ut/hr , RN reports IV was beeping a lot thus just changed hep IV site to R forearm ~ 1hr prior to HL drawn. This likely contributed to drop in heparin level.  Although patient is obese, BMI 50, thus may require higher heparin rate. H/o Anemia of CKD.  Hgb 7.7 , pltc  wnl/stable.  No bleeding noted per RN.     Goal of Therapy:  Heparin level 0.3-0.7 units/ml Monitor platelets by anticoagulation protocol: Yes   Plan:  Give IV Heparin 2000 unit bolus x 1  Increase IV heparin to 2000 units/hr  Check 6hr HL Monitor daily CBC, s/sx bleeding   Planning cath 8/7   Nicole Cella, RPh Clinical Pharmacist [phone] Please check AMION for all [place] contact numbers Clinical Pharmacist 10/26/2021 12:19 PM

## 2021-10-27 ENCOUNTER — Inpatient Hospital Stay (HOSPITAL_COMMUNITY)
Admission: RE | Disposition: A | Discharge status: Home Health | Payer: Self-pay | Source: Home / Self Care | Attending: Internal Medicine

## 2021-10-27 ENCOUNTER — Inpatient Hospital Stay: Payer: Self-pay

## 2021-10-27 ENCOUNTER — Encounter (HOSPITAL_COMMUNITY): Payer: Self-pay | Admitting: Cardiology

## 2021-10-27 DIAGNOSIS — D509 Iron deficiency anemia, unspecified: Secondary | ICD-10-CM

## 2021-10-27 DIAGNOSIS — I25810 Atherosclerosis of coronary artery bypass graft(s) without angina pectoris: Secondary | ICD-10-CM

## 2021-10-27 DIAGNOSIS — I5032 Chronic diastolic (congestive) heart failure: Secondary | ICD-10-CM

## 2021-10-27 DIAGNOSIS — I2510 Atherosclerotic heart disease of native coronary artery without angina pectoris: Secondary | ICD-10-CM | POA: Diagnosis not present

## 2021-10-27 DIAGNOSIS — I214 Non-ST elevation (NSTEMI) myocardial infarction: Secondary | ICD-10-CM | POA: Diagnosis not present

## 2021-10-27 HISTORY — PX: LEFT HEART CATH AND CORS/GRAFTS ANGIOGRAPHY: CATH118250

## 2021-10-27 LAB — BASIC METABOLIC PANEL
Anion gap: 4 — ABNORMAL LOW (ref 5–15)
BUN: 28 mg/dL — ABNORMAL HIGH (ref 6–20)
CO2: 26 mmol/L (ref 22–32)
Calcium: 8 mg/dL — ABNORMAL LOW (ref 8.9–10.3)
Chloride: 110 mmol/L (ref 98–111)
Creatinine, Ser: 1.93 mg/dL — ABNORMAL HIGH (ref 0.44–1.00)
GFR, Estimated: 30 mL/min — ABNORMAL LOW (ref >60)
Glucose, Bld: 170 mg/dL — ABNORMAL HIGH (ref 70–99)
Potassium: 3.6 mmol/L (ref 3.5–5.1)
Sodium: 140 mmol/L (ref 135–145)

## 2021-10-27 LAB — CBC
HCT: 22.1 % — ABNORMAL LOW (ref 36.0–46.0)
Hemoglobin: 7.3 g/dL — ABNORMAL LOW (ref 12.0–15.0)
MCH: 28.3 pg (ref 26.0–34.0)
MCHC: 33 g/dL (ref 30.0–36.0)
MCV: 85.7 fL (ref 80.0–100.0)
Platelets: 173 10*3/uL (ref 150–400)
RBC: 2.58 MIL/uL — ABNORMAL LOW (ref 3.87–5.11)
RDW: 16.2 % — ABNORMAL HIGH (ref 11.5–15.5)
WBC: 6.4 10*3/uL (ref 4.0–10.5)
nRBC: 0.6 % — ABNORMAL HIGH (ref 0.0–0.2)

## 2021-10-27 LAB — IRON AND TIBC
Iron: 37 ug/dL (ref 28–170)
Saturation Ratios: 9 % — ABNORMAL LOW (ref 10.4–31.8)
TIBC: 413 ug/dL (ref 250–450)
UIBC: 376 ug/dL

## 2021-10-27 LAB — GLUCOSE, CAPILLARY
Glucose-Capillary: 165 mg/dL — ABNORMAL HIGH (ref 70–99)
Glucose-Capillary: 189 mg/dL — ABNORMAL HIGH (ref 70–99)
Glucose-Capillary: 274 mg/dL — ABNORMAL HIGH (ref 70–99)
Glucose-Capillary: 210 mg/dL — ABNORMAL HIGH (ref 70–99)

## 2021-10-27 LAB — FOLATE: Folate: 16.3 ng/mL (ref >5.9)

## 2021-10-27 LAB — HEPARIN LEVEL (UNFRACTIONATED): Heparin Unfractionated: 0.49 IU/mL (ref 0.30–0.70)

## 2021-10-27 LAB — VITAMIN B12: Vitamin B-12: 534 pg/mL (ref 180–914)

## 2021-10-27 LAB — FERRITIN: Ferritin: 39 ng/mL (ref 11–307)

## 2021-10-27 SURGERY — LEFT HEART CATH AND CORS/GRAFTS ANGIOGRAPHY
Anesthesia: LOCAL

## 2021-10-27 MED ORDER — PEG-KCL-NACL-NASULF-NA ASC-C 100 G PO SOLR: 1.0000 | Freq: Once | ORAL | Status: DC

## 2021-10-27 MED ORDER — HYDROXYZINE HCL 25 MG PO TABS: 25.0000 mg | ORAL_TABLET | Freq: Three times a day (TID) | ORAL | Status: DC | PRN

## 2021-10-27 MED ORDER — SODIUM CHLORIDE 0.9 % IV SOLN: 250.0000 mL | INTRAVENOUS | Status: DC | PRN

## 2021-10-27 MED ORDER — CHLORHEXIDINE GLUCONATE CLOTH 2 % EX PADS
6.0000 | MEDICATED_PAD | Freq: Every day | CUTANEOUS | Status: DC
  Administered 2021-10-27 – 2021-11-05 (×10): 6 via TOPICAL

## 2021-10-27 MED ORDER — IOHEXOL 350 MG/ML SOLN
INTRAVENOUS | Status: DC | PRN
  Administered 2021-10-27: 110 mL

## 2021-10-27 MED ORDER — FENTANYL CITRATE (PF) 100 MCG/2ML IJ SOLN
INTRAMUSCULAR | Status: AC
  Filled 2021-10-27: qty 2

## 2021-10-27 MED ORDER — POTASSIUM CHLORIDE CRYS ER 20 MEQ PO TBCR
40.0000 meq | EXTENDED_RELEASE_TABLET | Freq: Once | ORAL | Status: AC
Start: 2021-10-27 — End: 2021-10-27
  Administered 2021-10-27: 40 meq via ORAL
  Filled 2021-10-27: qty 2

## 2021-10-27 MED ORDER — SODIUM CHLORIDE 0.9% FLUSH: 10.0000 mL | INTRAVENOUS | Status: DC | PRN

## 2021-10-27 MED ORDER — BISACODYL 5 MG PO TBEC
20.0000 mg | DELAYED_RELEASE_TABLET | Freq: Once | ORAL | Status: AC
  Administered 2021-10-27: 20 mg via ORAL
  Filled 2021-10-27: qty 4

## 2021-10-27 MED ORDER — ICOSAPENT ETHYL 1 G PO CAPS
2.0000 g | ORAL_CAPSULE | Freq: Two times a day (BID) | ORAL | Status: DC
  Administered 2021-10-27 – 2021-10-30 (×7): 2 g via ORAL
  Filled 2021-10-27 (×8): qty 2

## 2021-10-27 MED ORDER — FUROSEMIDE 10 MG/ML IJ SOLN
80.0000 mg | Freq: Two times a day (BID) | INTRAMUSCULAR | Status: DC
  Administered 2021-10-27 – 2021-10-28 (×4): 80 mg via INTRAVENOUS
  Filled 2021-10-27 (×4): qty 8

## 2021-10-27 MED ORDER — LIDOCAINE HCL (PF) 1 % IJ SOLN
INTRAMUSCULAR | Status: DC | PRN
  Administered 2021-10-27: 15 mL

## 2021-10-27 MED ORDER — SODIUM CHLORIDE 0.9% FLUSH: 3.0000 mL | INTRAVENOUS | Status: DC | PRN

## 2021-10-27 MED ORDER — VERAPAMIL HCL 2.5 MG/ML IV SOLN
INTRAVENOUS | Status: AC
  Filled 2021-10-27: qty 2

## 2021-10-27 MED ORDER — HYDROMORPHONE HCL 1 MG/ML IJ SOLN
0.5000 mg | Freq: Once | INTRAMUSCULAR | Status: AC
  Administered 2021-10-27: 0.5 mg via INTRAVENOUS
  Filled 2021-10-27: qty 0.5

## 2021-10-27 MED ORDER — HEPARIN SODIUM (PORCINE) 5000 UNIT/ML IJ SOLN
5000.0000 [IU] | Freq: Three times a day (TID) | INTRAMUSCULAR | Status: AC
  Administered 2021-10-27: 5000 [IU] via SUBCUTANEOUS
  Filled 2021-10-27: qty 1

## 2021-10-27 MED ORDER — HEPARIN (PORCINE) IN NACL 1000-0.9 UT/500ML-% IV SOLN
INTRAVENOUS | Status: AC
  Filled 2021-10-27: qty 1000

## 2021-10-27 MED ORDER — PEG-KCL-NACL-NASULF-NA ASC-C 100 G PO SOLR
0.5000 | Freq: Once | ORAL | Status: AC
  Administered 2021-10-27: 100 g via ORAL
  Filled 2021-10-27: qty 1

## 2021-10-27 MED ORDER — ISOSORBIDE MONONITRATE ER 60 MG PO TB24
120.0000 mg | ORAL_TABLET | Freq: Every day | ORAL | Status: DC
  Administered 2021-10-27 – 2021-11-05 (×10): 120 mg via ORAL
  Filled 2021-10-27 (×10): qty 2

## 2021-10-27 MED ORDER — POTASSIUM CHLORIDE CRYS ER 20 MEQ PO TBCR
40.0000 meq | EXTENDED_RELEASE_TABLET | Freq: Once | ORAL | Status: DC
Start: 2021-10-27 — End: 2021-10-27

## 2021-10-27 MED ORDER — SODIUM CHLORIDE 0.9% FLUSH
3.0000 mL | Freq: Two times a day (BID) | INTRAVENOUS | Status: DC
  Administered 2021-10-27 – 2021-10-30 (×6): 3 mL via INTRAVENOUS

## 2021-10-27 MED ORDER — ZOLPIDEM TARTRATE 5 MG PO TABS
5.0000 mg | ORAL_TABLET | Freq: Every evening | ORAL | Status: DC | PRN
Start: 2021-10-27 — End: 2021-11-05
  Administered 2021-11-01 – 2021-11-05 (×4): 5 mg via ORAL
  Filled 2021-10-27 (×5): qty 1

## 2021-10-27 MED ORDER — LIDOCAINE HCL (PF) 1 % IJ SOLN
INTRAMUSCULAR | Status: AC
  Filled 2021-10-27: qty 30

## 2021-10-27 MED ORDER — SODIUM CHLORIDE 0.9% FLUSH
10.0000 mL | Freq: Two times a day (BID) | INTRAVENOUS | Status: DC
  Administered 2021-10-27 – 2021-10-30 (×6): 10 mL

## 2021-10-27 MED ORDER — POTASSIUM CHLORIDE CRYS ER 20 MEQ PO TBCR
40.0000 meq | EXTENDED_RELEASE_TABLET | Freq: Once | ORAL | Status: AC
  Administered 2021-10-27: 40 meq via ORAL
  Filled 2021-10-27: qty 2

## 2021-10-27 MED ORDER — HEPARIN (PORCINE) IN NACL 1000-0.9 UT/500ML-% IV SOLN
INTRAVENOUS | Status: DC | PRN
  Administered 2021-10-27 (×2): 500 mL

## 2021-10-27 MED ORDER — FENTANYL CITRATE (PF) 100 MCG/2ML IJ SOLN
INTRAMUSCULAR | Status: DC | PRN
  Administered 2021-10-27: 50 ug via INTRAVENOUS

## 2021-10-27 MED ORDER — RANOLAZINE ER 500 MG PO TB12: 500.0000 mg | ORAL_TABLET | Freq: Two times a day (BID) | ORAL | Status: DC

## 2021-10-27 MED ORDER — RANOLAZINE ER 500 MG PO TB12
1000.0000 mg | ORAL_TABLET | Freq: Two times a day (BID) | ORAL | Status: DC
  Administered 2021-10-27 – 2021-10-29 (×6): 1000 mg via ORAL
  Filled 2021-10-27 (×8): qty 2

## 2021-10-27 MED ORDER — METOCLOPRAMIDE HCL 5 MG/ML IJ SOLN
10.0000 mg | Freq: Four times a day (QID) | INTRAMUSCULAR | Status: AC
  Administered 2021-10-27 (×2): 10 mg via INTRAVENOUS
  Filled 2021-10-27 (×2): qty 2

## 2021-10-27 MED ORDER — FENOFIBRATE 160 MG PO TABS
160.0000 mg | ORAL_TABLET | Freq: Every day | ORAL | Status: DC
  Administered 2021-10-27 – 2021-11-05 (×10): 160 mg via ORAL
  Filled 2021-10-27 (×10): qty 1

## 2021-10-27 SURGICAL SUPPLY — 14 items
CATH INFINITI 5FR MULTPACK ANG (CATHETERS) ×1 IMPLANT
CLOSURE MYNX CONTROL 5F (Vascular Products) ×1 IMPLANT
GLIDESHEATH SLEND SS 6F .021 (SHEATH) IMPLANT
GUIDEWIRE INQWIRE 1.5J.035X260 (WIRE) IMPLANT
INQWIRE 1.5J .035X260CM (WIRE)
KIT HEART LEFT (KITS) ×2 IMPLANT
KIT MICROPUNCTURE NIT STIFF (SHEATH) ×1 IMPLANT
MAT PREVALON FULL STRYKER (MISCELLANEOUS) ×1 IMPLANT
PACK CARDIAC CATHETERIZATION (CUSTOM PROCEDURE TRAY) ×2 IMPLANT
SHEATH GLIDE SLENDER 4/5FR (SHEATH) IMPLANT
SHEATH PINNACLE 5F 10CM (SHEATH) ×1 IMPLANT
SHEATH PROBE COVER 6X72 (BAG) ×1 IMPLANT
TRANSDUCER W/STOPCOCK (MISCELLANEOUS) ×2 IMPLANT
WIRE EMERALD 3MM-J .035X150CM (WIRE) ×1 IMPLANT

## 2021-10-27 NOTE — Plan of Care (Signed)
  Problem: Fluid Volume: Goal: Ability to maintain a balanced intake and output will improve Outcome: Progressing   Problem: Metabolic: Goal: Ability to maintain appropriate glucose levels will improve Outcome: Progressing   Problem: Activity: Goal: Ability to return to baseline activity level will improve Outcome: Progressing

## 2021-10-27 NOTE — Interval H&P Note (Signed)
History and Physical Interval Note:  10/27/2021 9:07 AM  [name]  has presented today for surgery, with the diagnosis of NSTEMI.  The various methods of treatment have been discussed with the patient and family. After consideration of risks, benefits and other options for treatment, the patient has consented to  Procedure(s): LEFT HEART CATH AND CORS/GRAFTS ANGIOGRAPHY (N/A) as a surgical intervention.  The patient's history has been reviewed, patient examined, no change in status, stable for surgery.  I have reviewed the patient's chart and labs.  Questions were answered to the patient's satisfaction.     Collier Salina [facility] 10/27/2021. Now

## 2021-10-27 NOTE — Progress Notes (Signed)
[name]  IOE:[id number] DOB: 1966/02/28 DOA: 10/25/2021 PCP: Jacklynn Ganong, MD    Brief Narrative:  56 year old with a history of CAD status post CABG 2010 with subsequent angioplasty and DES 1829, diastolic CHF, CKD stage IV, DM 2, HTN, HLD, anemia of CKD, OSA not using CPAP, PAD, and depression/anxiety who originally presented to the [facility] ER with complaints of 3 days of intermittent central chest pressure radiating into her left arm associated with intermittent shortness of breath and nausea.  She reported that the symptoms were similar to her prior episodes of ACS.  In the [facility] ER her troponin was noted to be elevated.  CXR was consistent with pulmonary edema with small bilateral pleural effusions.  She was placed on IV heparin and IV nitroglycerin and transferred to [facility] for cardiology evaluation.  Consultants:  [facility] Cardiology Heart Failure Team  Goals of Care:  Code Status: Full Code   DVT prophylaxis: IV heparin   Interim Hx: The patient had an episode of chest pain last night for which she was given IV Dilaudid and an increased dose of IV nitroglycerin.  She refused CPAP last night.  Vitals are stable and she is afebrile at present.  Creatinine has been trending downward with gentle volume resuscitation.  She is seen in her room post cardiac cath and is complaining of severe low back pain related to laying on the cath table.  She cannot swallow oral medications at present as she is limited to laying on her back to recover from the cath.  She denies shortness of breath nausea or vomiting.  Assessment & Plan:  Acute NSTEMI - CAD s/p CABG 2010, multiple angio/stents Troponin I from OSH 0.143 > 0.203 -continue IV heparin and IV nitroglycerin, aspirin, beta-blocker, and rosuvastatin -cardiac cath 8/7 noted essentially significant diffuse coronary disease with no need for acute intervention -ongoing care per cardiology  Acute exacerbation of chronic diastolic  CHF Admitting CXR with evidence of pulmonary edema -was gently diuresed initially with improvement in clinical exam but then required gentle volume resuscitation due to renal insufficiency felt to be worsened by diuretic -monitor creatinine closely in the postcardiac cath time frame -heart failure team following and plans repeat trial of diuresis with close hemodynamic monitoring  CKD stage IV  baseline creatinine ~2.3 -creatinine has returned to her baseline with gentle volume expansion  Recent Labs  Lab 10/25/21 0146 10/26/21 0248 10/26/21 2247  CREATININE 2.13* 2.71* 2.31*     Wound of right foot S/p right first toe amputation - new blistered area on plantar surface of the foot without open wound or drainage which she states has been present approximately 7 days - X-ray unable to exclude osteomyelitis -MRI of foot notes a new superficial fluid collection within the plantar medial aspect of the forefoot consistent with skin blistering with a deeper component extending towards the flexor longus tendon but no evidence of tenosynovitis septic arthritis or osteomyelitis - WBC normal and patient afebrile - exam of foot itself not worrisome - appears to simply be a large/deep blister not convincing of an infection -blister has now spontaneously ruptured -continue good hygiene with outpatient follow-up at the wound care center and with podiatry as per previous  Hypertension  blood pressure controlled -ongoing management per heart failure team  DM2 A1c 6.9 -CBG control improved with adjustment in treatment yesterday -patient has refused full insulin therapy today and this will likely affect her CBG -follow trend and continue to educate  Anemia of  chronic kidney disease Hemoglobin at recent baseline but heart failure team notes that her baseline in April 2022 was 11 -GI consulted to consider evaluation for occult indolent GI bleeding  OSA  Has not been using CPAP at home, and has consistently  refused it while in the hospital  Hyperlipidemia associated with type 2 diabetes mellitus  Continue rosuvastatin  Morbid Obesity - Body mass index is 50.78 kg/m.  Family Communication: No family present at time of exam Disposition: From home -anticipate return home after cardiac cath/cardiac work-up complete   Objective: Blood pressure 131/69, pulse 84, temperature 98.8 F (37.1 C), temperature source Oral, resp. rate 14, height '5\' 7"'$  (1.702 m), weight (!) 147.1 kg, SpO2 100 %.  Intake/Output Summary (Last 24 hours) at 10/27/2021 0748 Last data filed at 10/27/2021 0617 Gross per 24 hour  Intake 2079.84 ml  Output 550 ml  Net 1529.84 ml    Filed Weights   10/25/21 0100 10/26/21 0200 10/27/21 0400  Weight: (!) 144.1 kg (!) 146.2 kg (!) 147.1 kg    Examination: General: No acute respiratory distress Lungs: Distant breath sounds in all fields with no wheezing Cardiovascular: RRR without murmur Abdomen: Nontender, nondistended, soft, bowel sounds positive, no rebound, no ascites, no appreciable mass Extremities: 2+ persistent lower extremity edema with change of chronic venous stasis dermatitis bilaterally   CBC: Recent Labs  Lab 10/26/21 0248 10/26/21 2247 10/27/21 0600  WBC 5.8 7.5 6.4  HGB 7.7* 7.9* 7.3*  HCT 24.0* 24.4* 22.1*  MCV 84.5 85.0 85.7  PLT 188 190 962    Basic Metabolic Panel: Recent Labs  Lab 10/25/21 0146 10/26/21 0248 10/26/21 2247  NA 143 137 139  K 4.0 4.6 4.2  CL 107 101 104  CO2 '29 30 28  '$ GLUCOSE 91 236* 264*  BUN 30* 34* 34*  CREATININE 2.13* 2.71* 2.31*  CALCIUM 9.1 9.0 9.1  MG 1.9  --   --     GFR: Estimated Creatinine Clearance: 40.6 mL/min (A) (by C-G formula based on SCr of 2.31 mg/dL (H)).  Liver Function Tests: Recent Labs  Lab 10/25/21 0146 10/26/21 0248  AST 34 32  ALT 20 20  ALKPHOS 38 35*  BILITOT 0.9 0.6  PROT 6.4* 6.3*  ALBUMIN 2.8* 2.8*     HbA1C: Hgb A1c MFr Bld  Date/Time Value Ref Range Status   10/25/2021 01:46 AM 6.9 (H) 4.8 - 5.6 % Final    Comment:    (NOTE) Pre diabetes:          5.7%-6.4%  Diabetes:              >6.4%  Glycemic control for   <7.0% adults with diabetes   06/28/2020 02:42 PM 7.5 (H) 4.8 - 5.6 % Final    Comment:    REPEATED TO VERIFY (NOTE) Pre diabetes:          5.7%-6.4%  Diabetes:              >6.4%  Glycemic control for   <7.0% adults with diabetes      Scheduled Meds:  aspirin EC  81 mg Oral Daily   carvedilol  25 mg Oral BID WC   citalopram  20 mg Oral Daily   febuxostat  80 mg Oral q morning   insulin aspart  0-15 Units Subcutaneous TID WC   insulin aspart  0-5 Units Subcutaneous QHS   insulin detemir  12 Units Subcutaneous BID   rosuvastatin  5 mg Oral Daily  senna-docusate  1 tablet Oral BID   sodium chloride flush  3 mL Intravenous Q12H   sodium chloride flush  3 mL Intravenous Q12H   Continuous Infusions:  sodium chloride     sodium chloride 1 mL/kg/hr (10/27/21 0515)   heparin 2,000 Units/hr (10/27/21 0519)   nitroGLYCERIN 30 mcg/min (10/26/21 2031)     LOS: 2 days   Cherene Altes, MD [facility] Office  [phone] Pager - Text Page per Shea [name]  If 7PM-7AM, please contact night-coverage per Amion 10/27/2021, 7:48 AM

## 2021-10-27 NOTE — Progress Notes (Signed)
ANTICOAGULATION CONSULT NOTE - Follow Up Consult  Pharmacy Consult for heparin Indication:  NSTEMI  Labs: Recent Labs    10/25/21 0146 10/25/21 0727 10/26/21 0248 10/26/21 1059 10/26/21 2247  HGB 8.0*  --  7.7*  --  7.9*  HCT 24.6*  --  24.0*  --  24.4*  PLT 194  --  188  --  190  LABPROT 15.6*  --   --   --   --   INR 1.3*  --   --   --   --   HEPARINUNFRC 0.61 0.65 0.26* 0.20* 0.59  CREATININE 2.13*  --  2.71*  --  2.31*  TROPONINIHS 1,711* 1,943*  --   --   --     Assessment/Plan:  56yo female therapeutic on heparin after rate change. Will continue infusion at current rate of 2000 units/hr and confirm stable with am labs.   Wynona Neat, PharmD, BCPS  10/27/2021,12:22 AM

## 2021-10-27 NOTE — Progress Notes (Signed)
Patient called c/o  of Chest pain 6/10 Patient says her chest hurts unable to describ it. Skin warm and dry. Resp even and unlabored. Small nose bleed that stopped on it own. Patient also c/o of nausea.Patient pointing to mid chest going to the left around to her back. S.R. On Monitor V.S. stable. Butch Penny into assess patient and turn NTG up to 25 mcg at 20:17 EKG done Dr. Marcelle Smiling notified and ordered Dilaudid 0.5 mg I.V. for pain. Which was given .NTG drip turn up to 30 mcg . Patient tol. Well pain down to a 2/10.Cont. to monitor patient

## 2021-10-27 NOTE — Progress Notes (Signed)
ANTICOAGULATION CONSULT NOTE - Follow Up Consult  Pharmacy Consult for heparin  Indication: chest pain/ACS  Allergies  Allergen Reactions   Diphenhydramine-Acetaminophen Hives   Morphine Sulfate Other (See Comments)    SEVERE SOMNOLENCE  States just Morphine in pill form   Cephalosporins Anxiety   Bactrim [Sulfamethoxazole-Trimethoprim] Other (See Comments)    Skin peeled off   Diazepam Hives    Valium    Insulins     humulin   Latex Itching   Simvastatin Other (See Comments)    Sore Joints   Tylenol Pm Extra [Diphenhydramine-Apap (Sleep)] Hives   Ciprofloxacin Anxiety   Levofloxacin Anxiety    Patient Measurements: Height: '5\' 7"'$  (170.2 cm) Weight: (!) 147.1 kg (324 lb 3.2 oz) IBW/kg (Calculated) : 61.6 Heparin Dosing Weight: 97.1 kg  Vital Signs: Temp: 98.8 F (37.1 C) (08/07 0400) Temp Source: Oral (08/07 0400) BP: 131/69 (08/07 0400) Pulse Rate: 84 (08/07 0400)  Labs: Recent Labs    10/25/21 0146 10/25/21 0727 10/26/21 0248 10/26/21 1059 10/26/21 2247 10/27/21 0600  HGB 8.0*  --  7.7*  --  7.9* 7.3*  HCT 24.6*  --  24.0*  --  24.4* 22.1*  PLT 194  --  188  --  190 173  LABPROT 15.6*  --   --   --   --   --   INR 1.3*  --   --   --   --   --   HEPARINUNFRC 0.61 0.65 0.26* 0.20* 0.59 0.49  CREATININE 2.13*  --  2.71*  --  2.31* 1.93*  TROPONINIHS 1,711* 1,943*  --   --   --   --     Estimated Creatinine Clearance: 48.6 mL/min (A) (by C-G formula based on SCr of 1.93 mg/dL (H)).   Medications:  Infusions:   sodium chloride     sodium chloride 1 mL/kg/hr (10/27/21 0515)   heparin 2,000 Units/hr (10/27/21 0519)   nitroGLYCERIN 30 mcg/min (10/26/21 2031)    Assessment: 38 yof presenting with NSTEMI, CHF exacerbation, and diabetic foot infection, transferred to [facility] from OSH.  Hx  Pharmacy consulted to dose heparin continued on transfer. Patient is not on anticoagulation PTA.  Heparin level 0.49, at goal.  Hgb trending down 8 > 7.3.  Platelets stable.   No overt bleeding or complications noted.  Goal of Therapy:  Heparin level 0.3-0.7 units/ml Monitor platelets by anticoagulation protocol: Yes   Plan:  Continue IV heparin at current rate of 2000 units/hr. Daily heparin level and CBC. F/u plans for cath today.  [place] Crane, Roylene Reason, [facility] Clinical Pharmacist  10/27/2021 8:43 AM   [facility] pharmacy phone numbers are listed on [email or web address]

## 2021-10-27 NOTE — Consult Note (Signed)
[place] Nurse Consult Note: Patient receiving care in [place]. Patient was being transported from room to cardiac cath lab at the time of my arrival. Will attempt to see patient when she returns to her room. Val Riles, RN, MSN, CWOCN, CNS-BC, pager [phone]
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
## 2021-10-27 NOTE — Progress Notes (Signed)
Pt stated she is not able to tolerate CPAP machine. Pt will use 02 if indicated.
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
## 2021-10-27 NOTE — Progress Notes (Signed)
Mobility Specialist: Progress Note   10/27/21 1617  Mobility  Activity Ambulated with assistance in room  Level of Assistance Contact guard assist, steadying assist  Assistive Device None  Distance Ambulated (ft) 20 ft  Activity Response Tolerated well  $Mobility charge 1 Mobility   Post-Mobility: 106 HR, 134/70 (88) BP, 94% SpO2  Pt received sitting EOB and agreeable to mobility. C/o R foot pain during ambulation, otherwise asymptomatic. Contact guard for balance. Pt states she normally uses SPC at home. Pt sitting EOB after session with call bell and phone at her side.   [facility] Ahley Bulls Mobility Specialist Mobility Specialist [address]: [phone]

## 2021-10-27 NOTE — Progress Notes (Signed)
Peripherally Inserted Central Catheter Placement  The IV Nurse has discussed with the patient and/or persons authorized to consent for the patient, the purpose of this procedure and the potential benefits and risks involved with this procedure.  The benefits include less needle sticks, lab draws from the catheter, and the patient may be discharged home with the catheter. Risks include, but not limited to, infection, bleeding, blood clot (thrombus formation), and puncture of an artery; nerve damage and irregular heartbeat and possibility to perform a PICC exchange if needed/ordered by physician.  Alternatives to this procedure were also discussed.  Bard Power PICC patient education guide, fact sheet on infection prevention and patient information card has been provided to patient /or left at bedside.    PICC Placement Documentation  PICC Double Lumen 32/35/57 Right Basilic 42 cm 0 cm (Active)  Indication for Insertion or Continuance of Line Vasoactive infusions 10/27/21 1638  Exposed Catheter (cm) 0 cm 10/27/21 1638  Site Assessment Clean, Dry, Intact 10/27/21 1638  Lumen #1 Status Flushed;Blood return noted;Saline locked 10/27/21 1638  Lumen #2 Status Flushed;Blood return noted;Saline locked 10/27/21 1638  Dressing Type Transparent 10/27/21 1638  Dressing Status Antimicrobial disc in place 10/27/21 1638  Dressing Change Due 11/03/21 10/27/21 1638       Scotty Court 10/27/2021, 4:40 PM

## 2021-10-27 NOTE — Consult Note (Signed)
WOC Nurse Consult Note: Patient receiving care in [facility]. Primary RN present at time of my assessment. Reason for Consult: blister on plantar aspect of right foot Wound type: no open wound present, no bogginess, the fluid filled bulla had spontaneously ruptured at some point, now the area with intact overlying skin. Pressure Injury POA: Yes/No/NA Measurement: Wound bed: Drainage (amount, consistency, odor) none Periwound:intact Dressing procedure/placement/frequency: The plantar aspect of the right foot needs routine hygiene with soap and water. I encouraged the patient to keep her future appointments with the wound center and podiatrist for continued support with her feet.  Thank you for the consult.  Discussed plan of care with the patient and bedside nurse.  [facility] nurse will not follow at this time.  Please re-consult the [facility] team if needed.  Val Riles, RN, MSN, CWOCN, CNS-BC, pager [phone]

## 2021-10-27 NOTE — Progress Notes (Signed)
Pt's CBG=189. Pt should receive 3 units of insulin aspart based on ordered sliding scale. Pt requested of receiving 2 units of insulin instead and stated "I know my body". Given 2 units of insulin per pt requested.  MD notified and aware.   Lavenia Atlas, RN

## 2021-10-27 NOTE — Progress Notes (Signed)
Obtained the consent form and placed the form in pt's chart.   Lavenia Atlas, RN

## 2021-10-27 NOTE — Progress Notes (Signed)
Pt came back to rm 4 from cath lab. Reinitiated tele. VSS. Call bell within reach.   Lavenia Atlas, RN
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
## 2021-10-28 ENCOUNTER — Inpatient Hospital Stay (HOSPITAL_COMMUNITY): Payer: Medicare Other | Admitting: Anesthesiology

## 2021-10-28 ENCOUNTER — Encounter (HOSPITAL_COMMUNITY): Payer: Self-pay | Admitting: Internal Medicine

## 2021-10-28 ENCOUNTER — Encounter (HOSPITAL_COMMUNITY)
Admission: RE | Disposition: A | Discharge status: Home Health | Payer: Self-pay | Source: Home / Self Care | Attending: Internal Medicine

## 2021-10-28 ENCOUNTER — Inpatient Hospital Stay (HOSPITAL_COMMUNITY): Payer: Medicare Other

## 2021-10-28 ENCOUNTER — Other Ambulatory Visit: Payer: Self-pay

## 2021-10-28 DIAGNOSIS — S81809A Unspecified open wound, unspecified lower leg, initial encounter: Secondary | ICD-10-CM

## 2021-10-28 DIAGNOSIS — L0390 Cellulitis, unspecified: Secondary | ICD-10-CM

## 2021-10-28 DIAGNOSIS — K2971 Gastritis, unspecified, with bleeding: Secondary | ICD-10-CM

## 2021-10-28 DIAGNOSIS — I872 Venous insufficiency (chronic) (peripheral): Secondary | ICD-10-CM

## 2021-10-28 DIAGNOSIS — K449 Diaphragmatic hernia without obstruction or gangrene: Secondary | ICD-10-CM | POA: Diagnosis not present

## 2021-10-28 DIAGNOSIS — D123 Benign neoplasm of transverse colon: Secondary | ICD-10-CM

## 2021-10-28 DIAGNOSIS — I5032 Chronic diastolic (congestive) heart failure: Secondary | ICD-10-CM | POA: Diagnosis not present

## 2021-10-28 DIAGNOSIS — K209 Esophagitis, unspecified without bleeding: Secondary | ICD-10-CM | POA: Diagnosis not present

## 2021-10-28 DIAGNOSIS — D509 Iron deficiency anemia, unspecified: Secondary | ICD-10-CM | POA: Diagnosis not present

## 2021-10-28 DIAGNOSIS — I214 Non-ST elevation (NSTEMI) myocardial infarction: Secondary | ICD-10-CM | POA: Diagnosis not present

## 2021-10-28 DIAGNOSIS — Z89411 Acquired absence of right great toe: Secondary | ICD-10-CM

## 2021-10-28 HISTORY — PX: COLONOSCOPY WITH PROPOFOL: SHX5780

## 2021-10-28 HISTORY — PX: POLYPECTOMY: SHX5525

## 2021-10-28 HISTORY — PX: ESOPHAGOGASTRODUODENOSCOPY (EGD) WITH PROPOFOL: SHX5813

## 2021-10-28 HISTORY — PX: BIOPSY: SHX5522

## 2021-10-28 LAB — BASIC METABOLIC PANEL
Anion gap: 9 (ref 5–15)
Anion gap: 8 (ref 5–15)
BUN: 28 mg/dL — ABNORMAL HIGH (ref 6–20)
BUN: 28 mg/dL — ABNORMAL HIGH (ref 6–20)
CO2: 27 mmol/L (ref 22–32)
CO2: 26 mmol/L (ref 22–32)
Calcium: 9.4 mg/dL (ref 8.9–10.3)
Calcium: 9.3 mg/dL (ref 8.9–10.3)
Chloride: 105 mmol/L (ref 98–111)
Chloride: 107 mmol/L (ref 98–111)
Creatinine, Ser: 2.15 mg/dL — ABNORMAL HIGH (ref 0.44–1.00)
Creatinine, Ser: 2.11 mg/dL — ABNORMAL HIGH (ref 0.44–1.00)
GFR, Estimated: 26 mL/min — ABNORMAL LOW (ref >60)
GFR, Estimated: 27 mL/min — ABNORMAL LOW (ref >60)
Glucose, Bld: 227 mg/dL — ABNORMAL HIGH (ref 70–99)
Glucose, Bld: 189 mg/dL — ABNORMAL HIGH (ref 70–99)
Potassium: 4 mmol/L (ref 3.5–5.1)
Potassium: 4.1 mmol/L (ref 3.5–5.1)
Sodium: 141 mmol/L (ref 135–145)
Sodium: 141 mmol/L (ref 135–145)

## 2021-10-28 LAB — GLUCOSE, CAPILLARY
Glucose-Capillary: 220 mg/dL — ABNORMAL HIGH (ref 70–99)
Glucose-Capillary: 210 mg/dL — ABNORMAL HIGH (ref 70–99)
Glucose-Capillary: 227 mg/dL — ABNORMAL HIGH (ref 70–99)
Glucose-Capillary: 209 mg/dL — ABNORMAL HIGH (ref 70–99)
Glucose-Capillary: 191 mg/dL — ABNORMAL HIGH (ref 70–99)
Glucose-Capillary: 167 mg/dL — ABNORMAL HIGH (ref 70–99)
Glucose-Capillary: 298 mg/dL — ABNORMAL HIGH (ref 70–99)
Glucose-Capillary: 287 mg/dL — ABNORMAL HIGH (ref 70–99)

## 2021-10-28 LAB — CBC
HCT: 25.5 % — ABNORMAL LOW (ref 36.0–46.0)
Hemoglobin: 8.2 g/dL — ABNORMAL LOW (ref 12.0–15.0)
MCH: 27.2 pg (ref 26.0–34.0)
MCHC: 32.2 g/dL (ref 30.0–36.0)
MCV: 84.4 fL (ref 80.0–100.0)
Platelets: 200 10*3/uL (ref 150–400)
RBC: 3.02 MIL/uL — ABNORMAL LOW (ref 3.87–5.11)
RDW: 16.2 % — ABNORMAL HIGH (ref 11.5–15.5)
WBC: 7.9 10*3/uL (ref 4.0–10.5)
nRBC: 1 % — ABNORMAL HIGH (ref 0.0–0.2)

## 2021-10-28 LAB — LIPID PANEL
Cholesterol: 95 mg/dL (ref 0–200)
HDL: 29 mg/dL — ABNORMAL LOW (ref >40)
LDL Cholesterol: 38 mg/dL (ref 0–99)
Total CHOL/HDL Ratio: 3.3 RATIO
Triglycerides: 139 mg/dL (ref <150)
VLDL: 28 mg/dL (ref 0–40)

## 2021-10-28 SURGERY — COLONOSCOPY WITH PROPOFOL
Anesthesia: Monitor Anesthesia Care

## 2021-10-28 MED ORDER — PRASUGREL HCL 10 MG PO TABS
10.0000 mg | ORAL_TABLET | Freq: Every day | ORAL | Status: DC
  Administered 2021-10-29 – 2021-11-05 (×8): 10 mg via ORAL
  Filled 2021-10-28 (×8): qty 1

## 2021-10-28 MED ORDER — POTASSIUM CHLORIDE CRYS ER 20 MEQ PO TBCR
20.0000 meq | EXTENDED_RELEASE_TABLET | Freq: Once | ORAL | Status: AC
Start: 2021-10-28 — End: 2021-10-28
  Administered 2021-10-28: 20 meq via ORAL
  Filled 2021-10-28: qty 1

## 2021-10-28 MED ORDER — SODIUM CHLORIDE 0.9 % IV SOLN
510.0000 mg | Freq: Once | INTRAVENOUS | Status: AC
  Administered 2021-10-28: 510 mg via INTRAVENOUS
  Filled 2021-10-28: qty 17

## 2021-10-28 MED ORDER — PROPOFOL 500 MG/50ML IV EMUL
INTRAVENOUS | Status: DC | PRN
  Administered 2021-10-28: 150 ug/kg/min via INTRAVENOUS

## 2021-10-28 MED ORDER — METOLAZONE 2.5 MG PO TABS
2.5000 mg | ORAL_TABLET | Freq: Once | ORAL | Status: AC
  Administered 2021-10-28: 2.5 mg via ORAL
  Filled 2021-10-28: qty 1

## 2021-10-28 MED ORDER — PHENYLEPHRINE 80 MCG/ML (10ML) SYRINGE FOR IV PUSH (FOR BLOOD PRESSURE SUPPORT)
PREFILLED_SYRINGE | INTRAVENOUS | Status: DC | PRN
  Administered 2021-10-28 (×2): 80 ug via INTRAVENOUS

## 2021-10-28 MED ORDER — LIDOCAINE 2% (20 MG/ML) 5 ML SYRINGE
INTRAMUSCULAR | Status: DC | PRN
  Administered 2021-10-28: 80 mg via INTRAVENOUS

## 2021-10-28 MED ORDER — PROPOFOL 10 MG/ML IV BOLUS
INTRAVENOUS | Status: DC | PRN
  Administered 2021-10-28: 10 mg via INTRAVENOUS

## 2021-10-28 MED ORDER — CITALOPRAM HYDROBROMIDE 20 MG PO TABS
20.0000 mg | ORAL_TABLET | Freq: Two times a day (BID) | ORAL | Status: DC
  Administered 2021-10-28 – 2021-11-05 (×17): 20 mg via ORAL
  Filled 2021-10-28 (×18): qty 1

## 2021-10-28 MED ORDER — SODIUM CHLORIDE 0.9 % IV SOLN
INTRAVENOUS | Status: AC | PRN
  Administered 2021-10-28: 500 mL via INTRAVENOUS

## 2021-10-28 MED FILL — Verapamil HCl IV Soln 2.5 MG/ML: INTRAVENOUS | Qty: 2 | Status: AC

## 2021-10-28 SURGICAL SUPPLY — 25 items

## 2021-10-28 NOTE — Inpatient Diabetes Management (Signed)
Inpatient Diabetes Program Recommendations  AACE/ADA: New Consensus Statement on Inpatient Glycemic Control (2015)  Target Ranges:  Prepandial:   less than 140 mg/dL      Peak postprandial:   less than 180 mg/dL (1-2 hours)      Critically ill patients:  140 - 180 mg/dL   Lab Results  Component Value Date   GLUCAP 209 (H) 10/28/2021   HGBA1C 6.9 (H) 10/25/2021    Review of Glycemic Control  Latest Reference Range & Units 10/27/21 06:23 10/27/21 13:11 10/27/21 17:14 10/27/21 21:12 10/28/21 02:13 10/28/21 06:17 10/28/21 08:15  Glucose-Capillary 70 - 99 mg/dL 165 (H) 189 (H) 274 (H) 210 (H) 210 (H) 227 (H) 209 (H)   Diabetes history: DM 2 Outpatient Diabetes medications: Lantus 54 units bid, Novolog 12 units breakfast Novolog 11 units in the evening Current orders for Inpatient glycemic control:  Levemir 12 units bid Novolog 0-15 units tid + hs  A1c 6.9% on 8/5  Inpatient Diabetes Program Recommendations:    -  Increase Levemir to 15 units bid  Thanks,  Tama Headings RN, MSN, BC-ADM Inpatient Diabetes Coordinator Team Pager [phone] (8a-5p)

## 2021-10-28 NOTE — Progress Notes (Signed)
[name]  ZLD:[id number] DOB: 12/07/1965 DOA: 10/25/2021 PCP: Jacklynn Ganong, MD    Brief Narrative:  56 year old with a history of CAD status post CABG 2010 with subsequent angioplasty and DES 9030, diastolic CHF, CKD stage IV, DM 2, HTN, HLD, anemia of CKD, OSA not using CPAP, PAD, and depression/anxiety who originally presented to the [facility] ER with complaints of 3 days of intermittent central chest pressure radiating into her left arm associated with intermittent shortness of breath and nausea.  She reported that the symptoms were similar to her prior episodes of ACS.  In the [facility] ER her troponin was noted to be elevated.  CXR was consistent with pulmonary edema with small bilateral pleural effusions.  She was placed on IV heparin and IV nitroglycerin and transferred to [facility] for cardiology evaluation.  Consultants:  [facility] Cardiology Heart Failure Team [facility]  Goals of Care:  Code Status: Full Code   DVT prophylaxis: IV heparin   Interim Hx: Afebrile.  Vital signs are stable.  No new complaints today.  Tolerated her endoscopies without significant difficulty.  Denies shortness of breath fevers chills nausea or vomiting at present    Assessment & Plan:  Acute NSTEMI - CAD s/p CABG 2010, multiple angio/stents Troponin I from OSH 0.143 > 0.203 - cardiac cath 8/7 noted essentially significant diffuse coronary disease with no need for acute intervention -ongoing care per Cardiology   Acute exacerbation of chronic diastolic CHF Admitting CXR with evidence of pulmonary edema -was gently diuresed initially with improvement in clinical exam but then required gentle volume resuscitation due to renal insufficiency felt to be worsened by diuretic -monitor creatinine closely in the postcardiac cath time frame -Heart Failure Team following -titration of diuretics proving difficult as it is difficult to estimate her volume status due to body habitus -PICC line placed for CVP  monitoring  CKD stage IV  baseline creatinine ~2.3 -creatinine has returned to her baseline with gentle volume expansion  Recent Labs  Lab 10/25/21 0146 10/26/21 0248 10/26/21 2247 10/27/21 0600  CREATININE 2.13* 2.71* 2.31* 1.93*    Wound of right foot S/p right first toe amputation - new blistered area on plantar surface of the foot without open wound or drainage which she states has been present approximately 7 days - X-ray unable to exclude osteomyelitis -MRI of foot notes a new superficial fluid collection within the plantar medial aspect of the forefoot consistent with skin blistering with a deeper component extending towards the flexor longus tendon but no evidence of tenosynovitis septic arthritis or osteomyelitis - WBC normal and patient afebrile - exam of foot itself not worrisome - appears to simply be a large/deep blister not convincing of an infection -blister has now spontaneously ruptured -continue good hygiene with outpatient follow-up at the wound care center and with podiatry as per previous  Hypertension  ongoing management per Heart Failure Team will  DM2 A1c 6.9 -patient has been reluctant to allow adjustments in her insulin regimen, despite the fact that her CBGs remain poorly controlled -continue to educate patient and adjust treatment as she will allow  Anemia of chronic kidney disease Hemoglobin at recent baseline at presentation but heart failure team notes that her baseline in April 2022 was 11 -hemoglobin appeared to be drifting downward during hospital stay while on heparin drip, but spontaneously increased today - GI completed colonoscopy and EGD 8/8  Esophagitis and gastric erosions Diagnosed via EGD 8/8 -felt to be probable source of indolent anemia -  gastric and esophageal biopsies pending -twice daily for PPI for 12 weeks -repeat EGD in 8-12 weeks to assess for healing to be arranged by GI team  OSA  Has not been using CPAP at home, and has consistently  refused it while in the hospital  Hyperlipidemia associated with type 2 diabetes mellitus  Continue rosuvastatin  Morbid Obesity - Body mass index is 50.53 kg/m.  Family Communication: No family present at time of exam Disposition: From home -anticipate return home when cleared for such by Heart Failure Team   Objective: Blood pressure (!) 156/56, pulse 88, temperature 98.9 F (37.2 C), temperature source Oral, resp. rate 15, height '5\' 7"'$  (1.702 m), weight (!) 146.3 kg, SpO2 92 %.  Intake/Output Summary (Last 24 hours) at 10/28/2021 0826 Last data filed at 10/28/2021 0059 Gross per 24 hour  Intake 2708.47 ml  Output 1550 ml  Net 1158.47 ml   Filed Weights   10/26/21 0200 10/27/21 0400 10/28/21 0405  Weight: (!) 146.2 kg (!) 147.1 kg (!) 146.3 kg    Examination: General: No acute respiratory distress Lungs: Distant breath sounds in all fields with no wheezing Cardiovascular: RRR without murmur Abdomen: Nontender, nondistended, soft, bowel sounds positive, no rebound, no ascites, no appreciable mass Extremities: 2+ persistent lower extremity edema    CBC: Recent Labs  Lab 10/26/21 2247 10/27/21 0600 10/28/21 0754  WBC 7.5 6.4 7.9  HGB 7.9* 7.3* 8.2*  HCT 24.4* 22.1* 25.5*  MCV 85.0 85.7 84.4  PLT 190 173 678   Basic Metabolic Panel: Recent Labs  Lab 10/25/21 0146 10/26/21 0248 10/26/21 2247 10/27/21 0600  NA 143 137 139 140  K 4.0 4.6 4.2 3.6  CL 107 101 104 110  CO2 '29 30 28 26  '$ GLUCOSE 91 236* 264* 170*  BUN 30* 34* 34* 28*  CREATININE 2.13* 2.71* 2.31* 1.93*  CALCIUM 9.1 9.0 9.1 8.0*  MG 1.9  --   --   --    GFR: Estimated Creatinine Clearance: 48.5 mL/min (A) (by C-G formula based on SCr of 1.93 mg/dL (H)).  Liver Function Tests: Recent Labs  Lab 10/25/21 0146 10/26/21 0248  AST 34 32  ALT 20 20  ALKPHOS 38 35*  BILITOT 0.9 0.6  PROT 6.4* 6.3*  ALBUMIN 2.8* 2.8*    HbA1C: Hgb A1c MFr Bld  Date/Time Value Ref Range Status  10/25/2021  01:46 AM 6.9 (H) 4.8 - 5.6 % Final    Comment:    (NOTE) Pre diabetes:          5.7%-6.4%  Diabetes:              >6.4%  Glycemic control for   <7.0% adults with diabetes   06/28/2020 02:42 PM 7.5 (H) 4.8 - 5.6 % Final    Comment:    REPEATED TO VERIFY (NOTE) Pre diabetes:          5.7%-6.4%  Diabetes:              >6.4%  Glycemic control for   <7.0% adults with diabetes      Scheduled Meds:  aspirin EC  81 mg Oral Daily   carvedilol  25 mg Oral BID WC   Chlorhexidine Gluconate Cloth  6 each Topical Daily   citalopram  20 mg Oral BID   febuxostat  80 mg Oral q morning   fenofibrate  160 mg Oral Daily   furosemide  80 mg Intravenous BID   icosapent Ethyl  2 g Oral  BID   insulin aspart  0-15 Units Subcutaneous TID WC   insulin aspart  0-5 Units Subcutaneous QHS   insulin detemir  12 Units Subcutaneous BID   isosorbide mononitrate  120 mg Oral Daily   ranolazine  1,000 mg Oral BID   rosuvastatin  5 mg Oral Daily   senna-docusate  1 tablet Oral BID   sodium chloride flush  10-40 mL Intracatheter Q12H   sodium chloride flush  3 mL Intravenous Q12H   sodium chloride flush  3 mL Intravenous Q12H   Continuous Infusions:  sodium chloride       LOS: 3 days   Cherene Altes, MD [facility] Office  [phone] Pager - Text Page per Shea [name]  If 7PM-7AM, please contact night-coverage per Amion 10/28/2021, 8:26 AM

## 2021-10-28 NOTE — Anesthesia Preprocedure Evaluation (Addendum)
Anesthesia Evaluation  Patient identified by MRN, date of birth, ID band Patient awake    Reviewed: Allergy & Precautions, H&P , NPO status , Patient's Chart, lab work & pertinent test results  Airway Mallampati: III  TM Distance: >3 FB Neck ROM: Full    Dental no notable dental hx. (+) Poor Dentition, Chipped, Missing, Dental Advisory Given, Loose   Pulmonary sleep apnea ,    Pulmonary exam normal breath sounds clear to auscultation       Cardiovascular Exercise Tolerance: Good hypertension, + CAD, + Past MI, + Cardiac Stents and +CHF  Normal cardiovascular exam Rhythm:Regular Rate:Normal  Cath 8/23 Severe 3 vessel obstructive CAD Patent SVG to LAD Patent SVG to diagonal Known occlusion of the SVG to RCA Elevated LVEDP 33 mm Hg  Plan: SVG to LAD stents still patent. No significant change in diffuse LCx  disease. I suspect NSTEMI is more demand ischemia with CHF and severe  Anemia  ECHO 8/7 1. Distal septal, apical hypokinesis with overall preserved LV function.  2. Left ventricular ejection fraction, by estimation, is 55 to 60%. The  left ventricle has normal function. The left ventricle demonstrates  regional wall motion abnormalities (see scoring diagram/findings for  description). There is moderate concentric  left ventricular hypertrophy. Left ventricular diastolic parameters are  consistent with Grade II diastolic dysfunction (pseudonormalization).  Elevated left atrial pressure.  3. Right ventricular systolic function is normal. The right ventricular  size is normal.  4. The mitral valve is normal in structure. No evidence of mitral valve  regurgitation. No evidence of mitral stenosis.  5. The aortic valve is tricuspid. Aortic valve regurgitation is not  visualized. No aortic stenosis is present.  6. The inferior vena cava is dilated in size with >50% respiratory  variability, suggesting right atrial  pressure of 8 mmHg.    Neuro/Psych PSYCHIATRIC DISORDERS Depression negative neurological ROS     GI/Hepatic negative GI ROS, Neg liver ROS,   Endo/Other  diabetesMorbid obesity  Renal/GU ARFRenal disease  negative genitourinary   Musculoskeletal negative musculoskeletal ROS (+)   Abdominal   Peds negative pediatric ROS (+)  Hematology negative hematology ROS (+) Blood dyscrasia, anemia ,   Anesthesia Other Findings   Reproductive/Obstetrics negative OB ROS                           Anesthesia Physical Anesthesia Plan  ASA: 4  Anesthesia Plan: MAC   Post-op Pain Management: Minimal or no pain anticipated   Induction: Intravenous  PONV Risk Score and Plan: 2 and Treatment may vary due to age or medical condition  Airway Management Planned: Mask and Natural Airway  Additional Equipment: None  Intra-op Plan:   Post-operative Plan:   Informed Consent: I have reviewed the patients History and Physical, chart, labs and discussed the procedure including the risks, benefits and alternatives for the proposed anesthesia with the patient or authorized representative who has indicated his/her understanding and acceptance.       Plan Discussed with: Anesthesiologist and CRNA  Anesthesia Plan Comments:         Anesthesia Quick Evaluation

## 2021-10-28 NOTE — Interval H&P Note (Signed)
History and Physical Interval Note:  10/28/2021 9:18 AM  [name]  has presented today for surgery, with the diagnosis of Anemia.  History infrequent bleeding per rectum.  No prior EGD or colonoscopy.  The various methods of treatment have been discussed with the patient and family. After consideration of risks, benefits and other options for treatment, the patient has consented to  Procedure(s): COLONOSCOPY WITH PROPOFOL (N/A) ESOPHAGOGASTRODUODENOSCOPY (EGD) WITH PROPOFOL (N/A) as a surgical intervention.  The patient's history has been reviewed, patient examined, no change in status, stable for surgery.  I have reviewed the patient's chart and labs.  Questions were answered to the patient's satisfaction.     Sharyn Creamer

## 2021-10-28 NOTE — Progress Notes (Addendum)
Advanced Heart Failure Rounding Note  PCP-Cardiologist: None   Subjective:    Fe studies yesterday c/w IDA. B12 and folate normal.   Labs for today pending.   Going for EGD/colonoscopy. When I arrived to room transport was present for pickup and CVP was already discontented. RN got 17 earlier this morning. ~14 overnight.   1.5L in UOP yesterday. JVD to jaw. Denies resting dyspnea. No chest pain.    Echo w/ stable LV EF 55-60% with moderate LVH, RV normal.  LHC 8/7 w/ LVEDP 33, stable diffuse coronary disease with SVG-LAD still patent.   Objective:   Weight Range: (!) 146.3 kg Body mass index is 50.53 kg/m.   Vital Signs:   Temp:  [98.8 F (37.1 C)-99.9 F (37.7 C)] 98.9 F (37.2 C) (08/08 0405) Pulse Rate:  [0-120] 88 (08/07 2359) Resp:  [12-21] 16 (08/08 0405) BP: (98-165)/(48-90) 153/58 (08/08 0405) SpO2:  [90 %-100 %] 92 % (08/07 2010) Weight:  [146.3 kg] 146.3 kg (08/08 0405) Last BM Date : 10/27/21  Weight change: Filed Weights   10/26/21 0200 10/27/21 0400 10/28/21 0405  Weight: (!) 146.2 kg (!) 147.1 kg (!) 146.3 kg    Intake/Output:   Intake/Output Summary (Last 24 hours) at 10/28/2021 0801 Last data filed at 10/28/2021 0059 Gross per 24 hour  Intake 2708.47 ml  Output 1550 ml  Net 1158.47 ml      Physical Exam    General:  chronically ill appearing, obese. No resp difficulty HEENT: Normal Neck: Supple. Thick neck, JVD to jaw. Carotids 2+ bilat; no bruits. No lymphadenopathy or thyromegaly appreciated. Cor: PMI nondisplaced. Regular rate & rhythm. No rubs, gallops or murmurs. Lungs: Clear Abdomen: obese, soft, nontender, nondistended. No hepatosplenomegaly. No bruits or masses. Good bowel sounds. Extremities: No cyanosis, clubbing, rash, 1+ b/l LE edema w/ chronic venous stasis dermatitis  Neuro: Alert & orientedx3, cranial nerves grossly intact. moves all 4 extremities w/o difficulty. Affect pleasant   Telemetry   NSR 90s  EKG    No  new EKG for review   Labs    CBC Recent Labs    10/26/21 2247 10/27/21 0600  WBC 7.5 6.4  HGB 7.9* 7.3*  HCT 24.4* 22.1*  MCV 85.0 85.7  PLT 190 536   Basic Metabolic Panel Recent Labs    10/26/21 2247 10/27/21 0600  NA 139 140  K 4.2 3.6  CL 104 110  CO2 28 26  GLUCOSE 264* 170*  BUN 34* 28*  CREATININE 2.31* 1.93*  CALCIUM 9.1 8.0*   Liver Function Tests Recent Labs    10/26/21 0248  AST 32  ALT 20  ALKPHOS 35*  BILITOT 0.6  PROT 6.3*  ALBUMIN 2.8*   No results for input(s): "LIPASE", "AMYLASE" in the last 72 hours. Cardiac Enzymes No results for input(s): "CKTOTAL", "CKMB", "CKMBINDEX", "TROPONINI" in the last 72 hours.  BNP: BNP (last 3 results) No results for input(s): "BNP" in the last 8760 hours.  ProBNP (last 3 results) No results for input(s): "PROBNP" in the last 8760 hours.   D-Dimer No results for input(s): "DDIMER" in the last 72 hours. Hemoglobin A1C No results for input(s): "HGBA1C" in the last 72 hours. Fasting Lipid Panel No results for input(s): "CHOL", "HDL", "LDLCALC", "TRIG", "CHOLHDL", "LDLDIRECT" in the last 72 hours. Thyroid Function Tests No results for input(s): "TSH", "T4TOTAL", "T3FREE", "THYROIDAB" in the last 72 hours.  Invalid input(s): "FREET3"  Other results:   Imaging    [place] EKG SITE  RITE  Result Date: 10/27/2021 If Site Rite image not attached, placement could not be confirmed due to current cardiac rhythm.  CARDIAC CATHETERIZATION  Result Date: 10/27/2021   Mid LM to Dist LM lesion is 35% stenosed.   Ost LAD lesion is 70% stenosed.   Mid LAD lesion is 100% stenosed.   1st Mrg lesion is 70% stenosed.   Mid Cx lesion is 80% stenosed.   Prox RCA to Dist RCA lesion is 100% stenosed.   Origin to Mid Graft lesion is 35% stenosed.   Origin to Prox Graft lesion is 100% stenosed.   SVG graft was visualized by angiography and is small.   LV end diastolic pressure is moderately elevated. Severe 3 vessel obstructive CAD  Patent SVG to LAD Patent SVG to diagonal Known occlusion of the SVG to RCA Elevated LVEDP 33 mm Hg Plan: SVG to LAD stents still patent. No significant change in diffuse LCx disease. I suspect NSTEMI is more demand ischemia with CHF and severe anemia     Medications:     Scheduled Medications:  aspirin EC  81 mg Oral Daily   carvedilol  25 mg Oral BID WC   Chlorhexidine Gluconate Cloth  6 each Topical Daily   citalopram  20 mg Oral BID   febuxostat  80 mg Oral q morning   fenofibrate  160 mg Oral Daily   furosemide  80 mg Intravenous BID   icosapent Ethyl  2 g Oral BID   insulin aspart  0-15 Units Subcutaneous TID WC   insulin aspart  0-5 Units Subcutaneous QHS   insulin detemir  12 Units Subcutaneous BID   isosorbide mononitrate  120 mg Oral Daily   ranolazine  1,000 mg Oral BID   rosuvastatin  5 mg Oral Daily   senna-docusate  1 tablet Oral BID   sodium chloride flush  10-40 mL Intracatheter Q12H   sodium chloride flush  3 mL Intravenous Q12H   sodium chloride flush  3 mL Intravenous Q12H    Infusions:  sodium chloride      PRN Medications: sodium chloride, acetaminophen, ondansetron **OR** ondansetron (ZOFRAN) IV, oxyCODONE, pantoprazole, polyethylene glycol, sodium chloride flush, sodium chloride flush, traMADol, zolpidem    Patient Profile   56 y.o. female with history of CAD s/p CABG followed by mulitple PCIs, chronic diastolic CHF, HLD, obesity, venous insufficiency, recurrent LE wounds, CKD III, HTN, OSA, anemia.    Admitted with NSTEMI, AKI on CKD, acute on chronic anemia and notable RLE wound.  Assessment/Plan   1. CAD:  Very aggressive coronary disease.  Had 4 more drug-eluting stents placed in 12/11, 1 in the SVG-LAD and 3 to open the totally occluded native RCA after occlusion of the SVG-PDA. She was deemed not to be a candidate for re-do CABG at that admission by cardiac surgery. Unfortunately, due to her body habitus, the surgeons were unable to mobilize her  LIMA for grafting during her initial CABG operation. Lexiscan Cardiolite in 3/14 showed no ischemia or infarction though study was limited by body habitus.  NSTEMI in 7/20, LHC showed occluded SVG-RCA and native RCA, occluded mLAD, 80% in-stent restenosis in the SVG-LAD, 95% mLCx stenosis.  DES to SVG-LAD, LCx thought to be too small to intervene upon.  NSTEMI 11/21, occluded SVG-LAD treated with PTCA.  NSTEMI this admission with HS-TnI 1711 => 1943.  Cath this admit with chronic occlusion of RCA and SVG-RCA, 70% ostial LAD, occluded mid LAD, patent SVG-LAD, patent SVG-D, stable 80% mLCx  and 70% OM1 stenoses (diffuse disease in LCx system).  No target for intervention. Possible rise in TnI due to demand ischemia with anemia and volume overload in the setting of chronic coronary diffuse coronary disease.  - Has been on Effient long-term since she is a poor Plavix responder and has multiple DES.  Hold for now given anemia and need for GI workup.  - Continue ASA 81. - Continue Crestor.    - Continue Coreg, Imdur, and ranolazine 1000 mg bid.     2. Acute on chronic diastolic CHF: NYHA class III symptoms.  Torsemide recently decreased from 60 bid to 20 bid, unsure why.  This may have triggered volume overload/CHF.  Echo this admission with EF 55-60%, moderate LVH, normal RV.  LVEDP 33 on LHC.  Fluid overloaded on exam, CVP 14-17.  - Continue Lasix 80 mg IV bid for now. Add 2.5 of metolazone x 1.  - Followup BMET closely. Supp K PRN  - I do not think that she would be a good SGLT2-inhibitor candidate given body habitus (risk of UTIs/yeast infections).  3. Hyperlipidemia: She is on Vascepa, Crestor, and fenofibrate. Check lipid panel today  4. Obesity: Ideally would start semaglutide or dulaglutide.  5.Venous insufficiency:  ABIs normal last check. Venous stasis ulcer right foot followed by wound care.  MRI this admission did not show osteomyelitis.  - Would repeat ABIs.  6. CKD: Stage 3.  Creatinine appears  to run 2.1 - 2.2 baseline.  Was down to 1.9 yesterday.  However, did have contrast with LHC. Check BMP  - Follow creatinine closely.  - Avoid over-diuresing => follow CVPs closely     7. OSA: Needs CPAP.  8. Anemia: Hgb down to 7.3, uncertain cause of drop.  No definite overt GI bleeding. Hgb 8.2 today  - Fe studies c/w IDA. B12 and folate normal.  - Treat w/ feraheme  - FOBT pending  - GI consulted. Planing EGD/ colonoscopy today  - PPI   Length of Stay: [address], PA-C  10/28/2021, 8:01 AM  Advanced Heart Failure Team Pager [phone] (M-F; 7a - 5p)  Please contact [facility] for night-coverage after hours (5p -7a ) and weekends on [email or web address]  Patient seen with PA, agree with the above note.   Creatinine 2.15 today, mildly higher.  CVP 18-19.  She had EGD today with nonbleeding esophagitis and colonoscopy with 2 polyps removed and non-bleeding diverticulosis.    Feels short of breath with exertion.   General: NAD Neck: JVP 16 cm, no thyromegaly or thyroid nodule.  Lungs: Clear to auscultation bilaterally with normal respiratory effort. CV: Nondisplaced PMI.  Heart regular S1/S2, no S3/S4, no murmur.  Chronic edema to knees.  Abdomen: Soft, nontender, no hepatosplenomegaly, no distention.  Skin: Intact without lesions or rashes.  Neurologic: Alert and oriented x 3.  Psych: Normal affect. Extremities: No clubbing or cyanosis.  HEENT: Normal.   Patient remains volume overloaded with elevated CVP.  Creatinine higher but around her prior baseline.  - Continue Lasix 80 mg IV bid and will give a dose of metolazone 2.5 x 1.   - Long-term, I think Cardiomems placement would be helpful.   Hgb 8.2 today.  She has had feraheme.  EGD/colonoscopy did not show a source of bleeding.   Can restart her home Effient tomorrow.   Loralie Champagne 10/28/2021 1:37 PM

## 2021-10-28 NOTE — Transfer of Care (Signed)
Immediate Anesthesia Transfer of Care Note  Patient: [name]  Procedure(s) Performed: COLONOSCOPY WITH PROPOFOL ESOPHAGOGASTRODUODENOSCOPY (EGD) WITH PROPOFOL BIOPSY POLYPECTOMY  Patient Location: PACU  Anesthesia Type:MAC  Level of Consciousness: awake, alert  and oriented  Airway & Oxygen Therapy: Patient Spontanous Breathing and Patient connected to nasal cannula oxygen  Post-op Assessment: Report given to RN and Post -op Vital signs reviewed and stable  Post vital signs: Reviewed and stable  Last Vitals:  Vitals Value Taken Time  BP 133/58 10/28/21 1007  Temp    Pulse 77 10/28/21 1008  Resp 14 10/28/21 1008  SpO2 100 % 10/28/21 1008  Vitals shown include unvalidated device data.  Last Pain:  Vitals:   10/28/21 0844  TempSrc:   PainSc: 0-No pain         Complications: No notable events documented.

## 2021-10-28 NOTE — TOC Initial Note (Signed)
Transition of Care [facility]) - Initial/Assessment Note    Patient Details  Name: [name] MRN: [id number] Date of Birth: 04/22/1965  Transition of [facility] CM/SW Contact:    Erenest Rasher, RN Phone Number: [phone] 10/28/2021, 6:41 PM  Clinical Narrative:                 HF TOC CM spoke to pt and states she drives to appts. States both son and dtr are supportive. Her CPAP was picked up by DME provider due to not using (states she was in the hospital) and she will have to repeat sleep study. Pt may qualify for NIV, she will need PFT or ABG to see if she qualifies. Will continue to follow for dc needs.   Expected Discharge Plan: [place] Barriers to Discharge: Continued Medical Work up   Patient Goals and CMS Choice Patient states their goals for this hospitalization and ongoing recovery are:: wants to remain independent CMS Medicare.gov Compare Post Acute Care list provided to:: Patient    Expected Discharge Plan and Services Expected Discharge Plan: [place]   Discharge Planning Services: CM Consult   Living arrangements for the past 2 months: Mobile Home                                      Prior Living Arrangements/Services Living arrangements for the past 2 months: Mobile Home Lives with:: Self Patient language and need for interpreter reviewed:: Yes Do you feel safe going back to the place where you live?: Yes      Need for Family Participation in Patient Care: No (Comment) Care giver support system in place?: No (comment) Current home services: DME (cane, walker, scale) Criminal Activity/Legal Involvement Pertinent to Current Situation/Hospitalization: No - Comment as needed  Activities of Daily Living Home Assistive Devices/Equipment: None    Permission Sought/Granted Permission sought to share information with : Case Manager, PCP, Family Supports Permission granted to share information with : Yes, Verbal  Permission Granted  Share Information with NAME: [place] granted to share info w Relationship: daughter  Permission granted to share info w Contact Information: [phone]  Emotional Assessment Appearance:: Appears stated age Attitude/Demeanor/Rapport: Engaged Affect (typically observed): Accepting Orientation: : Oriented to Self, Oriented to Place, Oriented to  Time, Oriented to Situation   Psych Involvement: No (comment)  Admission diagnosis:  NSTEMI (non-ST elevated myocardial infarction) [facility]) [I21.4] Patient Active Problem List   Diagnosis Date Noted   NSTEMI (non-ST elevated myocardial infarction) ([place]) 10/25/2021   Acute on chronic heart failure with preserved ejection fraction (HFpEF) ([place]) 10/25/2021   CKD (chronic kidney disease), stage IV ([place]) 10/25/2021   Wound of right foot 10/25/2021   Insulin dependent type 2 diabetes mellitus ([place]) 10/25/2021   Anemia of chronic disease 10/25/2021   Chest pain 10/01/2018   Screening for cervical cancer 03/14/2018   Atherosclerosis 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 ([place]) 08/12/2017   Diarrhea 05/20/2016   Controlled substance agreement signed 01/07/2016   Chronic back pain 08/25/2013   Gastroenteritis 07/31/2013   Gout 07/31/2013   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   Acute renal failure ([place]) 07/26/2013   Chronic kidney disease, stage III (moderate) ([place]) 07/26/2013   Vomiting 07/26/2013   Medication management 05/08/2013   Dysthymia 10/28/2012   Acute myocardial infarction, subsequent episode  of care [facility]) 06/27/2012   Status post percutaneous transluminal coronary angioplasty 06/27/2012   Obesity 09/12/2011   OSA (obstructive sleep apnea) 09/12/2011   CHEST PAIN 02/04/2010   Chronic ischemic heart disease 01/24/2010   Congestive heart failure ([place]) 01/24/2010   PALPITATIONS 07/22/2009   Hyperlipidemia associated with type 2 diabetes mellitus ([place]) 05/07/2009    Hypertension associated with diabetes ([place]) 05/07/2009   Atherosclerosis of coronary artery bypass graft 05/07/2009   CHRONIC DIASTOLIC HEART FAILURE 81/82/9937   Other postprocedural status(V45.89) 16/96/7893   Umbilical hernia with gangrene 05/16/2007   PCP:  Jacklynn Ganong, MD Pharmacy:   [facility], [place], [address] [place] [address] Phone: [phone] Fax: [phone]  [facility] ([place], [address] [place] [address] Phone: [phone] Fax: [facility] ([facility]) - [place], [address] [place] [address] Phone: [phone] Fax: [phone]     Social Determinants of Health (SDOH) Interventions    Readmission Risk Interventions     No data to display

## 2021-10-28 NOTE — Progress Notes (Signed)
ABI w/ TBI study completed.   Please see CV Proc for preliminary results.   Hasset Chaviano, RDMS, RVT  

## 2021-10-28 NOTE — Progress Notes (Signed)
Patient returned to 4E from Endo. Vitals taken and stable. CHG completed. Patient oriented to unit and staff. Call bell within reach. [name]

## 2021-10-28 NOTE — Op Note (Signed)
[facility] Patient Name: [name] Procedure Date : 10/28/2021 MRN: [id number] Attending MD: [name] ,  Date of Birth: 12/25/1965 CSN: [id number] Age: 56 Admit Type: Inpatient Procedure:                Colonoscopy Indications:              Iron deficiency anemia Providers:                [name] Mango" Cherrie Distance, RN, [name]                            Technician, Technician Referring MD:             [name] Medicines:                Monitored Anesthesia Care Complications:            No immediate complications. Estimated Blood Loss:     Estimated blood loss was minimal. Procedure:                Pre-Anesthesia Assessment:                           - Prior to the procedure, a History and Physical                            was performed, and patient medications and                            allergies were reviewed. The patient's tolerance of                            previous anesthesia was also reviewed. The risks                            and benefits of the procedure and the sedation                            options and risks were discussed with the patient.                            All questions were answered, and informed consent                            was obtained. Prior Anticoagulants: The patient has                            taken Effient (prasugrel), last dose was 3 days                            prior to procedure. ASA Grade Assessment: III - A                            patient with severe systemic disease. After  reviewing the risks and benefits, the patient was                            deemed in satisfactory condition to undergo the                            procedure.                           After obtaining informed consent, the colonoscope                            was passed under direct vision. Throughout the                            procedure, the patient's  blood pressure, pulse, and                            oxygen saturations were monitored continuously. The                            CF-HQ190L ([id number] Olympus colonoscope was                            introduced through the anus and advanced to the the                            cecum, identified by appendiceal orifice and                            ileocecal valve. The colonoscopy was performed                            without difficulty. The patient tolerated the                            procedure well. The quality of the bowel                            preparation was good. The ileocecal valve,                            appendiceal orifice, and rectum were photographed. Scope In: 9:41:03 AM Scope Out: 5:02:77 AM Scope Withdrawal Time: 0 hours 12 minutes 1 second  Total Procedure Duration: 0 hours 15 minutes 51 seconds  Findings:      Two sessile polyps were found in the transverse colon. The polyps were 1       to 2 mm in size. These polyps were removed with a jumbo cold forceps.       Resection and retrieval were complete.      Multiple diverticula were found in the sigmoid colon and descending       colon.      Non-bleeding internal hemorrhoids were found during retroflexion. Impression:               - Two  1 to 2 mm polyps in the transverse colon,                            removed with a jumbo cold forceps. Resected and                            retrieved.                           - Diverticulosis in the sigmoid colon and in the                            descending colon.                           - Non-bleeding internal hemorrhoids. Recommendation:           - Return patient to [name] ward for ongoing care.                           - Patient had signs of scant hematin from gastric                            erosions. Otherwise no other sources of GI blood                            loss were noted.                           - Patient's Hb appears to be stable.  Okay to                            restart prasugrel tomorrow. No plans for further GI                            evaluation of anemia as an inpatient.                           - Await pathology results.                           - We will arrange for outpatient GI follow up.                           - The findings and recommendations were discussed                            with the patient and/or primary team. Procedure Code(s):        --- Professional ---                           [phone], Colonoscopy, flexible; with biopsy, single                            or multiple Diagnosis Code(s):        ---  Professional ---                           K63.5, Polyp of colon                           K64.8, Other hemorrhoids                           D50.9, Iron deficiency anemia, unspecified                           K57.30, Diverticulosis of large intestine without                            perforation or abscess without bleeding CPT copyright 2019 American Medical Association. All rights reserved. The codes documented in this report are preliminary and upon coder review may  be revised to meet current compliance requirements. [name] Masters "Christia [name],  10/28/2021 10:26:02 AM Number of Addenda: 0

## 2021-10-28 NOTE — Anesthesia Procedure Notes (Signed)
Procedure Name: MAC Date/Time: 10/28/2021 9:20 AM  Performed by: Mariea Clonts, CRNAPre-anesthesia Checklist: Patient identified, Emergency Drugs available, Suction available, Patient being monitored and Timeout performed Patient Re-evaluated:Patient Re-evaluated prior to induction Oxygen Delivery Method: Simple face mask and Nasal cannula

## 2021-10-28 NOTE — Plan of Care (Signed)
  Problem: Education: Goal: Ability to describe self-care measures that may prevent or decrease complications (Diabetes Survival Skills Education) will improve Outcome: Progressing   Problem: Coping: Goal: Ability to adjust to condition or change in health will improve Outcome: Progressing   Problem: Fluid Volume: Goal: Ability to maintain a balanced intake and output will improve Outcome: Progressing   Problem: Health Behavior/Discharge Planning: Goal: Ability to manage health-related needs will improve Outcome: Progressing   Problem: Metabolic: Goal: Ability to maintain appropriate glucose levels will improve Outcome: Progressing   Problem: Skin Integrity: Goal: Risk for impaired skin integrity will decrease Outcome: Progressing

## 2021-10-28 NOTE — Anesthesia Postprocedure Evaluation (Signed)
Anesthesia Post Note  Patient: [name]  Procedure(s) Performed: COLONOSCOPY WITH PROPOFOL ESOPHAGOGASTRODUODENOSCOPY (EGD) WITH PROPOFOL BIOPSY POLYPECTOMY     Patient location during evaluation: PACU Anesthesia Type: MAC Level of consciousness: awake and alert Pain management: pain level controlled Vital Signs Assessment: post-procedure vital signs reviewed and stable Respiratory status: spontaneous breathing, nonlabored ventilation, respiratory function stable and patient connected to nasal cannula oxygen Cardiovascular status: stable and blood pressure returned to baseline Postop Assessment: no apparent nausea or vomiting Anesthetic complications: no   No notable events documented.  Last Vitals:  Vitals:   10/28/21 1025 10/28/21 1122  BP: (!) 151/78 (!) 155/78  Pulse: 79   Resp: 12 18  Temp: 36.9 C 36.9 C  SpO2:  96%    Last Pain:  Vitals:   10/28/21 1122  TempSrc: Oral  PainSc:                  Cru Kritikos

## 2021-10-28 NOTE — Op Note (Addendum)
[facility] Patient Name: [name] Procedure Date : 10/28/2021 MRN: [id number] Attending MD: Georgian Co ,  Date of Birth: October 19, 1965 CSN: [id number] Age: 56 Admit Type: Inpatient Procedure:                Upper GI endoscopy Indications:              Iron deficiency anemia Providers:                Adline Mango" Cherrie Distance, RN, [facility]                            Technician, Technician Referring MD:             Carloyn Jaeger Medicines:                Monitored Anesthesia Care Complications:            No immediate complications. Estimated Blood Loss:     Estimated blood loss was minimal. Procedure:                Pre-Anesthesia Assessment:                           - Prior to the procedure, a History and Physical                            was performed, and patient medications and                            allergies were reviewed. The patient's tolerance of                            previous anesthesia was also reviewed. The risks                            and benefits of the procedure and the sedation                            options and risks were discussed with the patient.                            All questions were answered, and informed consent                            was obtained. Prior Anticoagulants: The patient has                            taken Effient (prasugrel), last dose was 3 days                            prior to procedure. ASA Grade Assessment: III - A                            patient with severe systemic disease. After  reviewing the risks and benefits, the patient was                            deemed in satisfactory condition to undergo the                            procedure.                           After obtaining informed consent, the endoscope was                            passed under direct vision. Throughout the                            procedure, the patient's  blood pressure, pulse, and                            oxygen saturations were monitored continuously. The                            [id number]-[id number] ([id number]) Olympus endoscope was introduced                            through the mouth, and advanced to the second part                            of duodenum. The upper GI endoscopy was                            accomplished without difficulty. The patient                            tolerated the procedure well. Scope In: Scope Out: Findings:      LA Grade C (one or more mucosal breaks continuous between tops of 2 or       more mucosal folds, less than 75% circumference) esophagitis with no       bleeding was found in the distal esophagus. Biopsies were taken with a       cold forceps for histology.      A small hiatal hernia was present.      Localized inflammation with overlying hematin characterized by       congestion (edema), erosions and erythema was found in the gastric       antrum. Biopsies were taken with a cold forceps for Helicobacter pylori       testing.      The examined duodenum was normal. Impression:               - LA Grade C esophagitis with no bleeding. Biopsied.                           - Small hiatal hernia.                           - Gastritis with overlying hematin. Biopsied.                           -  Normal examined duodenum. Recommendation:           - Use a proton pump inhibitor PO BID for 12 weeks.                           - Await pathology results.                           - Repeat upper endoscopy in 8-12 weeks to check                            healing.                           - Perform a colonoscopy today. Procedure Code(s):        --- Professional ---                           [phone], Esophagogastroduodenoscopy, flexible,                            transoral; with biopsy, single or multiple Diagnosis Code(s):        --- Professional ---                           K20.90, Esophagitis, unspecified  without bleeding                           K44.9, Diaphragmatic hernia without obstruction or                            gangrene                           K29.71, Gastritis, unspecified, with bleeding                           D50.9, Iron deficiency anemia, unspecified CPT copyright 2019 American Medical Association. All rights reserved. The codes documented in this report are preliminary and upon coder review may  be revised to meet current compliance requirements. [name] "[name],  10/28/2021 10:13:20 AM Number of Addenda: 0

## 2021-10-29 ENCOUNTER — Encounter (HOSPITAL_COMMUNITY): Payer: Self-pay | Admitting: Internal Medicine

## 2021-10-29 DIAGNOSIS — E785 Hyperlipidemia, unspecified: Secondary | ICD-10-CM

## 2021-10-29 DIAGNOSIS — I5033 Acute on chronic diastolic (congestive) heart failure: Secondary | ICD-10-CM | POA: Diagnosis not present

## 2021-10-29 DIAGNOSIS — D638 Anemia in other chronic diseases classified elsewhere: Secondary | ICD-10-CM

## 2021-10-29 DIAGNOSIS — E1169 Type 2 diabetes mellitus with other specified complication: Secondary | ICD-10-CM

## 2021-10-29 DIAGNOSIS — N184 Chronic kidney disease, stage 4 (severe): Secondary | ICD-10-CM | POA: Diagnosis not present

## 2021-10-29 DIAGNOSIS — I214 Non-ST elevation (NSTEMI) myocardial infarction: Secondary | ICD-10-CM | POA: Diagnosis not present

## 2021-10-29 LAB — GLUCOSE, CAPILLARY
Glucose-Capillary: 165 mg/dL — ABNORMAL HIGH (ref 70–99)
Glucose-Capillary: 225 mg/dL — ABNORMAL HIGH (ref 70–99)
Glucose-Capillary: 171 mg/dL — ABNORMAL HIGH (ref 70–99)
Glucose-Capillary: 301 mg/dL — ABNORMAL HIGH (ref 70–99)

## 2021-10-29 LAB — BASIC METABOLIC PANEL
Anion gap: 9 (ref 5–15)
BUN: 33 mg/dL — ABNORMAL HIGH (ref 6–20)
CO2: 26 mmol/L (ref 22–32)
Calcium: 9.3 mg/dL (ref 8.9–10.3)
Chloride: 103 mmol/L (ref 98–111)
Creatinine, Ser: 2.82 mg/dL — ABNORMAL HIGH (ref 0.44–1.00)
GFR, Estimated: 19 mL/min — ABNORMAL LOW (ref >60)
Glucose, Bld: 169 mg/dL — ABNORMAL HIGH (ref 70–99)
Potassium: 4.1 mmol/L (ref 3.5–5.1)
Sodium: 138 mmol/L (ref 135–145)

## 2021-10-29 LAB — CBC
HCT: 22.9 % — ABNORMAL LOW (ref 36.0–46.0)
Hemoglobin: 7.6 g/dL — ABNORMAL LOW (ref 12.0–15.0)
MCH: 28.3 pg (ref 26.0–34.0)
MCHC: 33.2 g/dL (ref 30.0–36.0)
MCV: 85.1 fL (ref 80.0–100.0)
Platelets: 170 10*3/uL (ref 150–400)
RBC: 2.69 MIL/uL — ABNORMAL LOW (ref 3.87–5.11)
RDW: 16.9 % — ABNORMAL HIGH (ref 11.5–15.5)
WBC: 8.4 10*3/uL (ref 4.0–10.5)
nRBC: 1.6 % — ABNORMAL HIGH (ref 0.0–0.2)

## 2021-10-29 LAB — COOXEMETRY PANEL
Carboxyhemoglobin: 1.5 % (ref 0.5–1.5)
Methemoglobin: 1.2 % (ref 0.0–1.5)
O2 Saturation: 45.2 %
Total hemoglobin: 9.7 g/dL — ABNORMAL LOW (ref 12.0–16.0)

## 2021-10-29 LAB — MAGNESIUM: Magnesium: 1.8 mg/dL (ref 1.7–2.4)

## 2021-10-29 LAB — LIPOPROTEIN A (LPA): Lipoprotein (a): 26.6 nmol/L (ref <75.0)

## 2021-10-29 MED ORDER — INSULIN DETEMIR 100 UNIT/ML ~~LOC~~ SOLN
14.0000 [IU] | Freq: Two times a day (BID) | SUBCUTANEOUS | Status: DC
  Administered 2021-10-29 – 2021-10-31 (×5): 14 [IU] via SUBCUTANEOUS
  Filled 2021-10-29 (×7): qty 0.14

## 2021-10-29 MED ORDER — PANTOPRAZOLE SODIUM 40 MG PO TBEC
40.0000 mg | DELAYED_RELEASE_TABLET | Freq: Two times a day (BID) | ORAL | Status: DC
  Administered 2021-10-29 (×2): 40 mg via ORAL
  Filled 2021-10-29 (×4): qty 1

## 2021-10-29 MED ORDER — MAGNESIUM SULFATE 2 GM/50ML IV SOLN
2.0000 g | Freq: Once | INTRAVENOUS | Status: AC
Start: 2021-10-29 — End: 2021-10-29
  Administered 2021-10-29: 2 g via INTRAVENOUS
  Filled 2021-10-29: qty 50

## 2021-10-29 NOTE — Progress Notes (Signed)
   10/29/21 1113  Therapy Vitals  Pulse Rate 82  Resp 18  BP 127/67  Patient Position (if appropriate) Sitting  Oxygen Therapy  SpO2 97 %  Mobility  Activity Ambulated with assistance in room  Level of Assistance Contact guard assist, steadying assist  Assistive Device None  Distance Ambulated (ft) 20 ft  Activity Response Tolerated fair  $Mobility charge 1 Mobility   Mobility Specialist - Progress Note   Pre-mobility: 82bpm, SpO2 100%  Post-mobility: 85 HR, 127/67 (86) BP, 100% SPO2  Pt received sitting EOB, reluctant but agreeable to mobility w/ encouragement. Ambulated on 2L/min [place]. Ambulated w/ out AD, but pt noted to be "furniture surfing". C/o of SOB during ambulation. Otherwise, no complaints. Pt left EOB w/ necessities w/ in reach.   [facility]

## 2021-10-29 NOTE — Progress Notes (Addendum)
Advanced Heart Failure Rounding Note  PCP-Cardiologist: None   Subjective:    Fe studies -c/w IDA. B12 and folate normal.   Echo w/ stable LV EF 55-60% with moderate LVH, RV normal.  LHC 8/7 w/ LVEDP 33, stable diffuse coronary disease with SVG-LAD still patent.   Hgb 7.6 Creatinine 2.1>2.8  Says she feels dry because her lips are dry.     Objective:   Weight Range: (!) 147.9 kg Body mass index is 51.07 kg/m.   Vital Signs:   Temp:  [98.4 F (36.9 C)-101 F (38.3 C)] 99.2 F (37.3 C) (08/09 0325) Pulse Rate:  [78-114] 89 (08/09 0325) Resp:  [12-20] 19 (08/09 0325) BP: (107-158)/(52-87) 107/55 (08/09 0325) SpO2:  [96 %-100 %] 100 % (08/09 0325) Weight:  [147.9 kg] 147.9 kg (08/09 0556) Last BM Date : 10/28/21  Weight change: Filed Weights   10/27/21 0400 10/28/21 0405 10/29/21 0556  Weight: (!) 147.1 kg (!) 146.3 kg (!) 147.9 kg    Intake/Output:   Intake/Output Summary (Last 24 hours) at 10/29/2021 0819 Last data filed at 10/29/2021 6761 Gross per 24 hour  Intake 797 ml  Output 1050 ml  Net -253 ml    CVP 17-18   Physical Exam    General:   No resp difficulty HEENT: normal Neck: supple. JVD  difficult to assess due to body habitus. Carotids 2+ bilat; no bruits. No lymphadenopathy or thryomegaly appreciated. Cor: PMI nondisplaced. Regular rate & rhythm. No rubs, gallops or murmurs. Lungs: clear Abdomen: soft, nontender, nondistended. No hepatosplenomegaly. No bruits or masses. Good bowel sounds. Extremities: no cyanosis, clubbing, rash, edema. [name] PICC Neuro: alert & orientedx3, cranial nerves grossly intact. moves all 4 extremities w/o difficulty. Affect pleasant   Telemetry   SR 90s personally checked.   EKG    No new EKG for review   Labs    CBC Recent Labs    10/28/21 0754 10/29/21 0500  WBC 7.9 8.4  HGB 8.2* 7.6*  HCT 25.5* 22.9*  MCV 84.4 85.1  PLT 200 950   Basic Metabolic Panel Recent Labs    10/28/21 1400  10/29/21 0500  NA 141 138  K 4.1 4.1  CL 107 103  CO2 26 26  GLUCOSE 189* 169*  BUN 28* 33*  CREATININE 2.11* 2.82*  CALCIUM 9.3 9.3   Liver Function Tests No results for input(s): "AST", "ALT", "ALKPHOS", "BILITOT", "PROT", "ALBUMIN" in the last 72 hours.  No results for input(s): "LIPASE", "AMYLASE" in the last 72 hours. Cardiac Enzymes No results for input(s): "CKTOTAL", "CKMB", "CKMBINDEX", "TROPONINI" in the last 72 hours.  BNP: BNP (last 3 results) No results for input(s): "BNP" in the last 8760 hours.  ProBNP (last 3 results) No results for input(s): "PROBNP" in the last 8760 hours.   D-Dimer No results for input(s): "DDIMER" in the last 72 hours. Hemoglobin A1C No results for input(s): "HGBA1C" in the last 72 hours. Fasting Lipid Panel Recent Labs    10/28/21 0754  CHOL 95  HDL 29*  LDLCALC 38  TRIG 139  CHOLHDL 3.3   Thyroid Function Tests No results for input(s): "TSH", "T4TOTAL", "T3FREE", "THYROIDAB" in the last 72 hours.  Invalid input(s): "FREET3"  Other results:   Imaging    VAS [place] ABI WITH/WO TBI  Result Date: 10/28/2021  LOWER EXTREMITY DOPPLER STUDY Patient Name:  [name]  Date of Exam:   10/28/2021 Medical Rec #: [id number]       Accession #:  [id number] Date of Birth: 1965-10-03        Patient Gender: F Patient Age:   56 years Exam Location:  [facility] Procedure:      VAS [place] ABI WITH/WO TBI Referring Phys: Collier Salina [place] --------------------------------------------------------------------------------  Indications: Venous stasis ulceration, non-healing wounds. Other Factors: S/P Right great toe amputation.  Limitations: Today's exam was limited due to skin changes and body habitus. Comparison Study: 06-11-2021 Prior arterial duplex lower extremity performed at                   outside facility showed no eidence of hemodynamically                   significant obstruction bilaterally                    01-17-2021 Prior ABI performed  at outside facility Performing Technologist: Darlin Coco RDMS RVT  Examination Guidelines: A complete evaluation includes at minimum, Doppler waveform signals and systolic blood pressure reading at the level of bilateral brachial, anterior tibial, and posterior tibial arteries, when vessel segments are accessible. Bilateral testing is considered an integral part of a complete examination. Photoelectric Plethysmograph (PPG) waveforms and toe systolic pressure readings are included as required and additional duplex testing as needed. Limited examinations for reoccurring indications may be performed as noted.  ABI Findings: +---------+------------------+-----+--------+----------------------------------+ Right    Rt Pressure (mmHg)IndexWaveformComment                            +---------+------------------+-----+--------+----------------------------------+ Brachial                                PICC line- unable to                                                       insonate/obtain pressure           +---------+------------------+-----+--------+----------------------------------+ PTA      255                    biphasic                                   +---------+------------------+-----+--------+----------------------------------+ DP       255                    biphasic                                   +---------+------------------+-----+--------+----------------------------------+ Great Toe                               Right toe amputation               +---------+------------------+-----+--------+----------------------------------+ +---------+------------------+-----+---------+---------------------------------+ Left     Lt Pressure (mmHg)IndexWaveform Comment                           +---------+------------------+-----+---------+---------------------------------+ Brachial  triphasicExtra long cuff inflated to full                                            capacity; however, waveform                                                remained present                  +---------+------------------+-----+---------+---------------------------------+ PTA      255                             Unable to obtain signal secondary                                          to skin changes                   +---------+------------------+-----+---------+---------------------------------+ DP       255                    triphasic                                  +---------+------------------+-----+---------+---------------------------------+ [facility]                                                               +---------+------------------+-----+---------+---------------------------------+ Arterial wall calcification precludes accurate ankle pressures and ABIs.  Summary: Right: Resting right ankle-brachial index indicates noncompressible right lower extremity arteries. Left: Resting left ankle-brachial index indicates noncompressible left lower extremity arteries. *See table(s) above for measurements and observations.  Electronically signed by Deitra Mayo MD on 10/28/2021 at 7:14:54 PM.    Final      Medications:     Scheduled Medications:  aspirin EC  81 mg Oral Daily   carvedilol  25 mg Oral BID WC   Chlorhexidine Gluconate Cloth  6 each Topical Daily   citalopram  20 mg Oral BID   febuxostat  80 mg Oral q morning   fenofibrate  160 mg Oral Daily   icosapent Ethyl  2 g Oral BID   insulin aspart  0-15 Units Subcutaneous TID WC   insulin aspart  0-5 Units Subcutaneous QHS   insulin detemir  14 Units Subcutaneous BID   isosorbide mononitrate  120 mg Oral Daily   pantoprazole  40 mg Oral BID   prasugrel  10 mg Oral Daily   ranolazine  1,000 mg Oral BID   rosuvastatin  5 mg Oral Daily   senna-docusate  1 tablet Oral BID   sodium chloride flush  10-40 mL Intracatheter Q12H   sodium chloride flush  3 mL  Intravenous Q12H   sodium chloride flush  3 mL Intravenous Q12H    Infusions:  sodium chloride      PRN Medications:  sodium chloride, acetaminophen, ondansetron **OR** ondansetron (ZOFRAN) IV, oxyCODONE, polyethylene glycol, sodium chloride flush, sodium chloride flush, traMADol, zolpidem    Patient Profile   56 y.o. female with history of CAD s/p CABG followed by mulitple PCIs, chronic diastolic CHF, HLD, obesity, venous insufficiency, recurrent LE wounds, CKD III, HTN, OSA, anemia.    Admitted with NSTEMI, AKI on CKD, acute on chronic anemia and notable RLE wound.  Assessment/Plan   1. CAD:  Very aggressive coronary disease.  Had 4 more drug-eluting stents placed in 12/11, 1 in the SVG-LAD and 3 to open the totally occluded native RCA after occlusion of the SVG-PDA. She was deemed not to be a candidate for re-do CABG at that admission by cardiac surgery. Unfortunately, due to her body habitus, the surgeons were unable to mobilize her LIMA for grafting during her initial CABG operation. Lexiscan Cardiolite in 3/14 showed no ischemia or infarction though study was limited by body habitus.  NSTEMI in 7/20, LHC showed occluded SVG-RCA and native RCA, occluded mLAD, 80% in-stent restenosis in the SVG-LAD, 95% mLCx stenosis.  DES to SVG-LAD, LCx thought to be too small to intervene upon.  NSTEMI 11/21, occluded SVG-LAD treated with PTCA.  NSTEMI this admission with HS-TnI 1711 => 1943.  Cath this admit with chronic occlusion of RCA and SVG-RCA, 70% ostial LAD, occluded mid LAD, patent SVG-LAD, patent SVG-D, stable 80% mLCx and 70% OM1 stenoses (diffuse disease in LCx system).  No target for intervention. Possible rise in TnI due to demand ischemia with anemia and volume overload in the setting of chronic coronary diffuse coronary disease.  - Has been on Effient long-term since she is a poor Plavix responder and has multiple DES.   - Continue ASA 81. - Continue Crestor.    - Continue Coreg,  Imdur, and ranolazine 1000 mg bid.     2. Acute on chronic diastolic CHF: NYHA class III symptoms.  Torsemide recently decreased from 60 bid to 20 bid, unsure why.  This may have triggered volume overload/CHF.  Echo this admission with EF 55-60%, moderate LVH, normal RV.  LVEDP 33 on LHC.  Fluid overloaded on exam, CVP 17-18.  - worsening renal funciton. Hold diuretics.  - Wound not use SGLT2-inhibitor candidate given body habitus (risk of UTIs/yeast infections).  3. Hyperlipidemia: She is on Vascepa, Crestor, and fenofibrate. Check lipid panel today  4. Obesity: Ideally would start semaglutide or dulaglutide.  5.Venous insufficiency:  ABIs normal last check. Venous stasis ulcer right foot followed by wound care.  MRI this admission did not show osteomyelitis.  - ABIs: noncompressible R/L 6. CKD: Stage 3/Possible contrast nephropathy.  Creatinine appears to run 2.1 - 2.2 baseline.  Was down to 1.9 but today up to 2.8.  However, did have contrast with LHC.  - Creatinine up to 2.8  - Hold diuretics.   7. OSA: Needs CPAP.  8. Anemia: Hgb down to 7.3, uncertain cause of drop.  No definite overt GI bleeding. Hgb 7.6 today  - Fe studies c/w IDA. B12 and folate normal.  - Treated w/ feraheme  - FOBT pending  - GI consulted. S/P EGD/ colonoscopy--esophagitis.  - PPI   Length of Stay: [place], NP  10/29/2021, 8:19 AM  Advanced Heart Failure Team Pager [phone] (M-F; 7a - 5p)  Please contact [facility] for night-coverage after hours (5p -7a ) and weekends on [email or web address]  Patient seen with NP, agree with the above note.   CVP 17-18 today, she did  not diurese well yesterday.  Creatinine up to 2.8.    Hgb 7.6, colonoscopy/EGD yesterday did not show a definite cause for anemia.   She complains of a dry mouth.  Short of breath walking around the room.  General: NAD Neck: No JVD, no thyromegaly or thyroid nodule.  Lungs: Clear to auscultation bilaterally with normal respiratory  effort. CV: Nondisplaced PMI.  Heart regular S1/S2, no S3/S4, no murmur.  No peripheral edema.  No carotid bruit.  Normal pedal pulses.  Abdomen: Soft, nontender, no hepatosplenomegaly, no distention.  Skin: Intact without lesions or rashes.  Neurologic: Alert and oriented x 3.  Psych: Normal affect. Extremities: No clubbing or cyanosis.  HEENT: Normal.   AKI with poor UOP, BP stable. This may be contrast-induced nephropathy. I will hold diuretics today even though CVP remains significantly elevated (diastolic CHF).   - Reassess renal function tomorrow.  - She may need RHC.   Hgb remains low but stable.  No definite source of bleeding found (?consider capsule endoscopy).  She has had feraheme.   [name] 10/29/2021

## 2021-10-29 NOTE — Evaluation (Signed)
Physical Therapy Evaluation Patient Details Name: [name] MRN: [id number] DOB: September 29, 1965 Today's Date: 10/29/2021  History of Present Illness  Pt is a 56 y.o. female admitted 10/25/21 with chest pain; workup for NSTEMI, CHF. S/p LHC 8/7 showing severe 3-vessel obstructive CAD. Echo with stable LV EF 55-60%. S/p colonoscopy and EGD 8/8. Pt also c/o R foot pain; MRI consistent with skin blistering. 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   Clinical Impression  Pt presents with an overall decrease in functional mobility secondary to above. PTA, pt reports typically mod indep with intermittent use of SPC or walker, lives with daughter who works. Today, pt tolerating brief bouts of ambulation with rollator and supervision; pt with significantly slowed gait speed with c/o fatigue and SOB. Pt reports anticipating increased difficulty managing ADL/iADLs upon return home, wondering if PCA/CNA services are an option. Pt would benefit from continued acute PT services to maximize functional mobility and independence prior to d/c with HHPT services.     SpO2 93-97% on RA, HR 80s-90s    Recommendations for follow up therapy are one component of a multi-disciplinary discharge planning process, led by the attending physician.  Recommendations may be updated based on patient status, additional functional criteria and insurance authorization.  Follow Up Recommendations Home health PT (PCA/CNA for ADL/iADL assist?)      Assistance Recommended at Discharge Set up Supervision/Assistance  Patient can return home with the following  A little help with bathing/dressing/bathroom;Assistance with cooking/housework;Assist for transportation;Help with stairs or ramp for entrance    Equipment Recommendations Bariatric rollator (4 wheels)   Recommendations for Other Services    Occupational Therapy   Functional Status Assessment Patient has had a recent  decline in their functional status and demonstrates the ability to make significant improvements in function in a reasonable and predictable amount of time.     Precautions / Restrictions Precautions Precautions: Fall Restrictions Weight Bearing Restrictions: No      Mobility  Bed Mobility Overal bed mobility: Needs Assistance Bed Mobility: Supine to Sit     Supine to sit: Min assist, HOB elevated     General bed mobility comments: minA for HHA to scoot hips to EOB    Transfers Overall transfer level: Needs assistance Equipment used: Rollator (4 wheels) Transfers: Sit to/from Stand Sit to Stand: Supervision           General transfer comment: multiple sit<>stands from EOB and rollator seat with supervision for safety/lines; cues to push rollator against wall/counter to prevent it from sliding    Ambulation/Gait Ambulation/Gait assistance: Supervision Gait Distance (Feet): 60 Feet (+ 22') Assistive device: Rollator (4 wheels) Gait Pattern/deviations: Step-through pattern, Decreased stride length, Trunk flexed Gait velocity: Decreased     General Gait Details: very slow, fatigued gait with rollator and supervision for safety/lines; pt taking slow, intentional breaths, minimally conversant; pt requesting 1x seated rest break secondary to fatigue and SOB  Stairs            Wheelchair Mobility    Modified Rankin (Stroke Patients Only)       Balance Overall balance assessment: Needs assistance Sitting-balance support: No upper extremity supported Sitting balance-Leahy Scale: Good     Standing balance support: No upper extremity supported Standing balance-Leahy Scale: Fair Standing balance comment: can static stand without DME, preference for rollator  Pertinent Vitals/Pain Pain Assessment Pain Assessment: Faces Pain Location: generalized Pain Descriptors / Indicators: Discomfort, Tiring Pain Intervention(s):  Monitored during session    Home Living Family/patient expects to be discharged to:: Private residence Living Arrangements: Children Available Help at Discharge: Family;Available PRN/intermittently Type of Home: House Home Access: Stairs to enter Entrance Stairs-Rails: Right (reports "wobbly") Entrance Stairs-Number of Steps: 5   Home Layout: One level Home Equipment: Rollator (4 wheels);Cane - single point Additional Comments: reports needs new rollator. reports daughter works long days and "gets no time off"    Prior Function Prior Level of Function : Independent/Modified Independent             Mobility Comments: typically indep ambulating with and without SPC; having increased difficulty recently due to SOB and fatigue ADLs Comments: typically mod indep, does bird baths at sink, able to prep meals and keep household; pt expecting to have increased difficulty with this     Hand Dominance        Extremity/Trunk Assessment   Upper Extremity Assessment Upper Extremity Assessment: Overall WFL for tasks assessed    Lower Extremity Assessment Lower Extremity Assessment: Generalized weakness       Communication   Communication: No difficulties  Cognition Arousal/Alertness: Awake/alert Behavior During Therapy: WFL for tasks assessed/performed, Flat affect Overall Cognitive Status: Within Functional Limits for tasks assessed                                 General Comments: WFL for simple tasks; not formally assessed        General Comments General comments (skin integrity, edema, etc.): pt anticipates increased difficulty managing at home since getting sick, daughter works and unable to assist; pt open to [facility] services - discussed CNA/PCA options, but typically out of pocket expense. pt also requesting new rollator since brakes/wheels wearing on hers    Exercises     Assessment/Plan    PT Assessment Patient needs continued PT services  PT Problem  List Decreased strength;Decreased activity tolerance;Decreased balance;Decreased mobility;Cardiopulmonary status limiting activity       PT Treatment Interventions DME instruction;Gait training;Stair training;Functional mobility training;Therapeutic activities;Therapeutic exercise;Balance training;Patient/family education    PT Goals (Current goals can be found in the Care Plan section)  Acute Rehab PT Goals Patient Stated Goal: wants new rollator and more help at home PT Goal Formulation: With patient Time For Goal Achievement: 11/12/21 Potential to Achieve Goals: Good    Frequency Min 3X/week     Co-evaluation               AM-PAC PT "6 Clicks" Mobility  Outcome Measure Help needed turning from your back to your side while in a flat bed without using bedrails?: A Little Help needed moving from lying on your back to sitting on the side of a flat bed without using bedrails?: A Little Help needed moving to and from a bed to a chair (including a wheelchair)?: A Little Help needed standing up from a chair using your arms (e.g., wheelchair or bedside chair)?: A Little Help needed to walk in hospital room?: A Little Help needed climbing 3-5 steps with a railing? : A Lot 6 Click Score: 17    End of Session   Activity Tolerance: Patient limited by fatigue Patient left: in chair;with call bell/phone within reach Nurse Communication: Mobility status PT Visit Diagnosis: Other abnormalities of gait and mobility (R26.89)    Time: 5366-4403  PT Time Calculation (min) (ACUTE ONLY): 35 min   Charges:   PT Evaluation $PT Eval Moderate Complexity: [place], PT, DPT Acute Rehabilitation Services  Personal: [place] [facility] Office: [place] 10/29/2021, 5:14 PM

## 2021-10-29 NOTE — Discharge Instructions (Signed)

## 2021-10-29 NOTE — Plan of Care (Signed)
  Problem: Education: Goal: Ability to describe self-care measures that may prevent or decrease complications (Diabetes Survival Skills Education) will improve Outcome: Progressing   Problem: Fluid Volume: Goal: Ability to maintain a balanced intake and output will improve Outcome: Progressing   Problem: Health Behavior/Discharge Planning: Goal: Ability to manage health-related needs will improve Outcome: Progressing   Problem: Skin Integrity: Goal: Risk for impaired skin integrity will decrease Outcome: Progressing   Problem: Tissue Perfusion: Goal: Adequacy of tissue perfusion will improve Outcome: Progressing

## 2021-10-29 NOTE — Care Management Important Message (Signed)
Important Message  Patient Details  Name: [name] MRN: [id number] Date of Birth: June 05, 1965   Medicare Important Message Given:  Yes     Shelda Altes 10/29/2021, 10:27 AM

## 2021-10-29 NOTE — Progress Notes (Signed)
Nutrition Education Note  RD consulted for nutrition education regarding heart failure and diabetes. Patient does not consume artificial sweeteners because they make her sick. We discussed ways to reduce added sugar intake at home. She does not add sugar to much that she eats or drinks anymore.   Lab Results  Component Value Date   HGBA1C 6.9 (H) 10/25/2021     RD provided "Heart Healthy, Consistent Carbohydrate Nutrition Therapy" handout from the Academy of [employer]etics. Reviewed patient's dietary recall. Provided examples on ways to decrease sodium intake in diet. Discouraged intake of processed foods and use of salt shaker. Encouraged fresh fruits and vegetables as well as whole grain sources of carbohydrates to maximize fiber intake.   Discussed different food groups and their effects on blood sugar, emphasizing carbohydrate-containing foods. Provided list of carbohydrates and recommended serving sizes of common foods.  Discussed importance of controlled and consistent carbohydrate intake throughout the day. Provided examples of ways to balance meals/snacks and encouraged intake of high-fiber, whole grain complex carbohydrates. Teach back method used.  Expect fair compliance.  Body mass index is 51.07 kg/m. Pt meets criteria for class 3, extreme/morbid obesity based on current BMI.  Current diet order is Heart healthy carb modified, patient is consuming approximately 100% of meals at this time. Labs and medications reviewed. No further nutrition interventions warranted at this time. RD contact information provided. If additional nutrition issues arise, please re-consult RD.   Lucas Mallow RD, LDN, CNSC Please refer to Amion for contact information.

## 2021-10-29 NOTE — Progress Notes (Signed)
[facility] PROGRESS NOTE    Progress Note  [name]  OFB:[id number] DOB: 1965-08-31 DOA: 10/25/2021 PCP: Jacklynn Ganong, MD     Brief Narrative:   [name] is an 56 y.o. female past medical history of CAD s/p CABG in 2010, subsequently underwent angioplasty with a drug-eluting stent in 7824, chronic diastolic heart failure, chronic kidney disease stage IV, diabetes mellitus type 2, essential hypertension anemia of chronic disease paroxysmal atrial fibrillation, who presented to [facility] with complaints of 3 days of intermittent chest pain radiating to her left arm intermittent associated with nausea and shortness of breath.  Had chest pain hospital cardiac markers were elevated chest x-ray showed pulmonary edema placed on IV heparin and nitroglycerin transferred to [facility] for cardiology evaluation    Significant Events: Cardiology Heart failure team [facility]   Assessment/Plan:   Acute NSTEMI (non-ST elevated myocardial infarction) [facility]) Cardiac cath on 10/27/2021 showed diffuse coronary artery disease no need for intervention. She was continue on aspirin Imdur, statins and Coreg. Continue Ranexa.  Acute on chronic heart failure with preserved ejection fraction (HFpEF) ([place]) Admitting x-ray showed pulmonary edema was gently diuresed but then required volume resuscitation due to renal dysfunction. Echo on this admission showed an EF of 55% Right failure team is consulting and they are titrating diuretics. PICC line was placed to monitor CVP due to her body habitus. Currently on Coreg, Imdur. Further diuretic management per advanced heart failure team, positive about 2.8 L.  Chronic kidney disease stage IV: With a baseline creatinine of around 2.3, creatinine returned to baseline with gentle fluid resuscitation.  Wound of the right foot with a history of post first toe amputation: X-ray did not show any osteomyelitis. MRI of the foot showed new superficial  fluid collection in the plantar medial aspect consistent with skin blistering with a deeper component extending towards the flexor longus but no evidence of tenosynovitis, no septic arthritis or osteomyelitis. Remained afebrile with no leukocytosis. Blister has spontaneously rupture will continue wound care.  Essential hypertension See heart failure for medications.  Diabetes mellitus type 2: With an A1c of 6.9 patient has been reluctant to adjust her insulin despite that her blood glucose remains poorly controlled. Her blood glucose remain high will discuss with the patient to titrate her long-acting insulin.  Anemia of chronic renal disease: Due to her NSTEMI was placed on IV heparin, 2022 her hemoglobin was 1, was drifting down. GI was consulted on 10/28/2020; EGD showed grade C esophagitis no bleeding, gastritis with overlying hematin biopsies were taken.  GI recommended Protonix p.o. twice daily. Colonoscopy-showed 2 polyps which were removed diverticulosis no evidence of bleeding, some internal hemorrhoids.  Esophagitis/gastritis: Diagnosed by EGD on 10/28/2021. Protonix p.o. twice daily. Will need repeat EGD in 12 weeks, further management per GI.  Obstructive sleep apnea: Noncompliant with BiPAP while in house.  Hyperlipidemia associated with diabetes mellitus type 2: Continue statins.   DVT prophylaxis: scd Family Communication:none Status is: Inpatient Remains inpatient appropriate because: Acute heart failure now with a GI bleed.    Code Status:     Code Status Orders  (From admission, onward)           Start     Ordered   10/25/21 0059  Full code  Continuous        10/25/21 0101           Code Status History     Date Active Date Inactive Code Status Order ID Comments User Context  10/01/2018 2219 10/17/2018 2118 Full Code [id number]  Nila Nephew, MD Inpatient         IV Access:   Peripheral IV   Procedures and diagnostic studies:   VAS  [place] ABI WITH/WO TBI  Result Date: 10/28/2021  LOWER EXTREMITY DOPPLER STUDY Patient Name:  [name]  Date of Exam:   10/28/2021 Medical Rec #: [id number]       Accession #:    [id number] Date of Birth: November 14, 1965        Patient Gender: F Patient Age:   61 years Exam Location:  [facility] Procedure:      VAS [place] ABI WITH/WO TBI Referring Phys: Collier Salina [place] --------------------------------------------------------------------------------  Indications: Venous stasis ulceration, non-healing wounds. Other Factors: S/P Right great toe amputation.  Limitations: Today's exam was limited due to skin changes and body habitus. Comparison Study: 06-11-2021 Prior arterial duplex lower extremity performed at                   outside facility showed no eidence of hemodynamically                   significant obstruction bilaterally                    01-17-2021 Prior ABI performed at outside facility Performing Technologist: Darlin Coco RDMS RVT  Examination Guidelines: A complete evaluation includes at minimum, Doppler waveform signals and systolic blood pressure reading at the level of bilateral brachial, anterior tibial, and posterior tibial arteries, when vessel segments are accessible. Bilateral testing is considered an integral part of a complete examination. Photoelectric Plethysmograph (PPG) waveforms and toe systolic pressure readings are included as required and additional duplex testing as needed. Limited examinations for reoccurring indications may be performed as noted.  ABI Findings: +---------+------------------+-----+--------+----------------------------------+ Right    Rt Pressure (mmHg)IndexWaveformComment                            +---------+------------------+-----+--------+----------------------------------+ Brachial                                PICC line- unable to                                                       insonate/obtain pressure            +---------+------------------+-----+--------+----------------------------------+ PTA      255                    biphasic                                   +---------+------------------+-----+--------+----------------------------------+ DP       255                    biphasic                                   +---------+------------------+-----+--------+----------------------------------+ Great Toe  Right toe amputation               +---------+------------------+-----+--------+----------------------------------+ +---------+------------------+-----+---------+---------------------------------+ Left     Lt Pressure (mmHg)IndexWaveform Comment                           +---------+------------------+-----+---------+---------------------------------+ Brachial                        triphasicExtra long cuff inflated to full                                           capacity; however, waveform                                                remained present                  +---------+------------------+-----+---------+---------------------------------+ PTA      255                             Unable to obtain signal secondary                                          to skin changes                   +---------+------------------+-----+---------+---------------------------------+ DP       255                    triphasic                                  +---------+------------------+-----+---------+---------------------------------+ Gardiner Rhyme                                                               +---------+------------------+-----+---------+---------------------------------+ Arterial wall calcification precludes accurate ankle pressures and ABIs.  Summary: Right: Resting right ankle-brachial index indicates noncompressible right lower extremity arteries. Left: Resting left ankle-brachial index indicates noncompressible  left lower extremity arteries. *See table(s) above for measurements and observations.  Electronically signed by Deitra Mayo MD on 10/28/2021 at 7:14:54 PM.    Final    [place] EKG SITE RITE  Result Date: 10/27/2021 If Site Rite image not attached, placement could not be confirmed due to current cardiac rhythm.  CARDIAC CATHETERIZATION  Result Date: 10/27/2021   Mid LM to Dist LM lesion is 35% stenosed.   Ost LAD lesion is 70% stenosed.   Mid LAD lesion is 100% stenosed.   1st Mrg lesion is 70% stenosed.   Mid Cx lesion is 80% stenosed.   Prox RCA to Dist RCA lesion is 100% stenosed.   Origin to Mid Graft lesion is 35% stenosed.   Origin to Prox Graft lesion is 100% stenosed.   SVG graft was visualized by angiography  and is small.   LV end diastolic pressure is moderately elevated. Severe 3 vessel obstructive CAD Patent SVG to LAD Patent SVG to diagonal Known occlusion of the SVG to RCA Elevated LVEDP 33 mm Hg Plan: SVG to LAD stents still patent. No significant change in diffuse LCx disease. I suspect NSTEMI is more demand ischemia with CHF and severe anemia     Medical Consultants:   None.   Subjective:    [name] has no complaints this morning.  Objective:    Vitals:   10/28/21 2242 10/28/21 2310 10/29/21 0325 10/29/21 0556  BP: 116/68 (!) 108/52 (!) 107/55   Pulse: 95 90 89   Resp: '15 17 19   '$ Temp: 98.4 F (36.9 C) 98.5 F (36.9 C) 99.2 F (37.3 C)   TempSrc: Oral Oral Oral   SpO2: 100% 100% 100%   Weight:    (!) 147.9 kg  Height:       SpO2: 100 % O2 Flow Rate (L/min): 2 L/min   Intake/Output Summary (Last 24 hours) at 10/29/2021 0759 Last data filed at 10/29/2021 6578 Gross per 24 hour  Intake 797 ml  Output 1050 ml  Net -253 ml   Filed Weights   10/27/21 0400 10/28/21 0405 10/29/21 0556  Weight: (!) 147.1 kg (!) 146.3 kg (!) 147.9 kg    Exam: General exam: In no acute distress, morbidly obese Respiratory system: Good air movement and clear to  auscultation. Cardiovascular system: S1 & S2 heard, RRR.  Gastrointestinal system: Abdomen is nondistended, soft and nontender.  Extremities: No pedal edema. Skin: No rashes, lesions or ulcers Psychiatry: Judgement and insight appear normal. Mood & affect appropriate.    Data Reviewed:    Labs: Basic Metabolic Panel: Recent Labs  Lab 10/25/21 0146 10/26/21 0248 10/26/21 2247 10/27/21 0600 10/28/21 0754 10/28/21 1400 10/29/21 0500  NA 143   < > 139 140 141 141 138  K 4.0   < > 4.2 3.6 4.0 4.1 4.1  CL 107   < > 104 110 105 107 103  CO2 29   < > '28 26 27 26 26  '$ GLUCOSE 91   < > 264* 170* 227* 189* 169*  BUN 30*   < > 34* 28* 28* 28* 33*  CREATININE 2.13*   < > 2.31* 1.93* 2.15* 2.11* 2.82*  CALCIUM 9.1   < > 9.1 8.0* 9.4 9.3 9.3  MG 1.9  --   --   --   --   --   --    < > = values in this interval not displayed.   GFR Estimated Creatinine Clearance: 33.4 mL/min (A) (by C-G formula based on SCr of 2.82 mg/dL (H)). Liver Function Tests: Recent Labs  Lab 10/25/21 0146 10/26/21 0248  AST 34 32  ALT 20 20  ALKPHOS 38 35*  BILITOT 0.9 0.6  PROT 6.4* 6.3*  ALBUMIN 2.8* 2.8*   No results for input(s): "LIPASE", "AMYLASE" in the last 168 hours. No results for input(s): "AMMONIA" in the last 168 hours. Coagulation profile Recent Labs  Lab 10/25/21 0146  INR 1.3*   COVID-19 Labs  Recent Labs    10/27/21 0600  FERRITIN 39    Lab Results  Component Value Date   SARSCOV2NAA NEGATIVE 10/06/2018    CBC: Recent Labs  Lab 10/26/21 0248 10/26/21 2247 10/27/21 0600 10/28/21 0754 10/29/21 0500  WBC 5.8 7.5 6.4 7.9 8.4  HGB 7.7* 7.9* 7.3* 8.2* 7.6*  HCT 24.0* 24.4* 22.1* 25.5*  22.9*  MCV 84.5 85.0 85.7 84.4 85.1  PLT 188 190 173 200 170   Cardiac Enzymes: No results for input(s): "CKTOTAL", "CKMB", "CKMBINDEX", "TROPONINI" in the last 168 hours. BNP (last 3 results) No results for input(s): "PROBNP" in the last 8760 hours. CBG: Recent Labs  Lab  10/28/21 1008 10/28/21 1116 10/28/21 1727 10/28/21 2113 10/29/21 0609  GLUCAP 191* 167* 298* 287* 165*   D-Dimer: No results for input(s): "DDIMER" in the last 72 hours. Hgb A1c: No results for input(s): "HGBA1C" in the last 72 hours. Lipid Profile: Recent Labs    10/28/21 0754  CHOL 95  HDL 29*  LDLCALC 38  TRIG 139  CHOLHDL 3.3   Thyroid function studies: No results for input(s): "TSH", "T4TOTAL", "T3FREE", "THYROIDAB" in the last 72 hours.  Invalid input(s): "FREET3" Anemia work up: Recent Labs    10/27/21 0600  VITAMINB12 534  FOLATE 16.3  FERRITIN 39  TIBC 413  IRON 37   Sepsis Labs: Recent Labs  Lab 10/26/21 2247 10/27/21 0600 10/28/21 0754 10/29/21 0500  WBC 7.5 6.4 7.9 8.4   Microbiology No results found for this or any previous visit (from the past 240 hour(s)).   Medications:    aspirin EC  81 mg Oral Daily   carvedilol  25 mg Oral BID WC   Chlorhexidine Gluconate Cloth  6 each Topical Daily   citalopram  20 mg Oral BID   febuxostat  80 mg Oral q morning   fenofibrate  160 mg Oral Daily   icosapent Ethyl  2 g Oral BID   insulin aspart  0-15 Units Subcutaneous TID WC   insulin aspart  0-5 Units Subcutaneous QHS   insulin detemir  12 Units Subcutaneous BID   isosorbide mononitrate  120 mg Oral Daily   prasugrel  10 mg Oral Daily   ranolazine  1,000 mg Oral BID   rosuvastatin  5 mg Oral Daily   senna-docusate  1 tablet Oral BID   sodium chloride flush  10-40 mL Intracatheter Q12H   sodium chloride flush  3 mL Intravenous Q12H   sodium chloride flush  3 mL Intravenous Q12H   Continuous Infusions:  sodium chloride        LOS: 4 days   Charlynne Cousins  [facility]  10/29/2021, 7:59 AM

## 2021-10-30 ENCOUNTER — Inpatient Hospital Stay (HOSPITAL_COMMUNITY): Payer: Medicare Other

## 2021-10-30 DIAGNOSIS — I214 Non-ST elevation (NSTEMI) myocardial infarction: Secondary | ICD-10-CM | POA: Diagnosis not present

## 2021-10-30 DIAGNOSIS — I5033 Acute on chronic diastolic (congestive) heart failure: Secondary | ICD-10-CM | POA: Diagnosis not present

## 2021-10-30 LAB — COOXEMETRY PANEL
Carboxyhemoglobin: 2 % — ABNORMAL HIGH (ref 0.5–1.5)
Methemoglobin: <0.7 % (ref 0.0–1.5)
O2 Saturation: 59 %
Total hemoglobin: 7.8 g/dL — ABNORMAL LOW (ref 12.0–16.0)

## 2021-10-30 LAB — BASIC METABOLIC PANEL
Anion gap: 8 (ref 5–15)
BUN: 46 mg/dL — ABNORMAL HIGH (ref 6–20)
CO2: 25 mmol/L (ref 22–32)
Calcium: 9.1 mg/dL (ref 8.9–10.3)
Chloride: 101 mmol/L (ref 98–111)
Creatinine, Ser: 4.19 mg/dL — ABNORMAL HIGH (ref 0.44–1.00)
GFR, Estimated: 12 mL/min — ABNORMAL LOW (ref >60)
Glucose, Bld: 191 mg/dL — ABNORMAL HIGH (ref 70–99)
Potassium: 4.3 mmol/L (ref 3.5–5.1)
Sodium: 134 mmol/L — ABNORMAL LOW (ref 135–145)

## 2021-10-30 LAB — GLUCOSE, CAPILLARY
Glucose-Capillary: 222 mg/dL — ABNORMAL HIGH (ref 70–99)
Glucose-Capillary: 217 mg/dL — ABNORMAL HIGH (ref 70–99)
Glucose-Capillary: 197 mg/dL — ABNORMAL HIGH (ref 70–99)
Glucose-Capillary: 191 mg/dL — ABNORMAL HIGH (ref 70–99)

## 2021-10-30 LAB — CBC
HCT: 21.7 % — ABNORMAL LOW (ref 36.0–46.0)
Hemoglobin: 7.3 g/dL — ABNORMAL LOW (ref 12.0–15.0)
MCH: 28 pg (ref 26.0–34.0)
MCHC: 33.6 g/dL (ref 30.0–36.0)
MCV: 83.1 fL (ref 80.0–100.0)
Platelets: 167 10*3/uL (ref 150–400)
RBC: 2.61 MIL/uL — ABNORMAL LOW (ref 3.87–5.11)
RDW: 16.9 % — ABNORMAL HIGH (ref 11.5–15.5)
WBC: 6.8 10*3/uL (ref 4.0–10.5)
nRBC: 2 % — ABNORMAL HIGH (ref 0.0–0.2)

## 2021-10-30 MED ORDER — RANOLAZINE ER 500 MG PO TB12
500.0000 mg | ORAL_TABLET | Freq: Every day | ORAL | Status: DC
  Administered 2021-10-30 – 2021-11-05 (×7): 500 mg via ORAL
  Filled 2021-10-30 (×7): qty 1

## 2021-10-30 MED ORDER — CARVEDILOL 12.5 MG PO TABS
12.5000 mg | ORAL_TABLET | Freq: Two times a day (BID) | ORAL | Status: DC
  Administered 2021-10-30 – 2021-11-05 (×13): 12.5 mg via ORAL
  Filled 2021-10-30 (×13): qty 1

## 2021-10-30 NOTE — Progress Notes (Signed)
Physical Therapy Treatment Patient Details Name: [name] MRN: [id number] DOB: Aug 05, 1965 Today's Date: 10/30/2021   History of Present Illness Pt is a 57 y.o. female admitted 10/25/21 with chest pain; workup for NSTEMI, CHF. S/p LHC 8/7 showing severe 3-vessel obstructive CAD. Echo with stable LV EF 55-60%. S/p colonoscopy and EGD 8/8. Pt also c/o R foot pain; MRI consistent with skin blistering. 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    PT Comments    Pt with slow progress with mobility secondary to fatigue and SOB.  Session focused on ambulation for increased activity tolerance and endurance with pt tolerating 3x short bouts of ambulation in hall with rollator and supervision requiring x2 extended seated recovery breaks between bouts secondary to fatigue and SOB. Increased time spent in discussion and education re; activity reccomendations, energy conservation, increasing activity slowly, and nutrition with pt verbalizing understanding. Pt continues to benefit from skilled PT services to progress toward functional mobility goals.    Recommendations for follow up therapy are one component of a multi-disciplinary discharge planning process, led by the attending physician.  Recommendations may be updated based on patient status, additional functional criteria and insurance authorization.  Follow Up Recommendations  Home health PT     Assistance Recommended at Discharge Set up Supervision/Assistance  Patient can return home with the following A little help with bathing/dressing/bathroom;Assistance with cooking/housework;Assist for transportation;Help with stairs or ramp for entrance   Equipment Recommendations  Rollator (4 wheels) (bariatric-sized)    Recommendations for Other Services       Precautions / Restrictions Precautions Precautions: Fall Precaution Comments: Watch O2 sats Restrictions Weight Bearing  Restrictions: No     Mobility  Bed Mobility Overal bed mobility: Needs Assistance             General bed mobility comments: sitting EOB pre and post session    Transfers Overall transfer level: Needs assistance Equipment used: Rollator (4 wheels) Transfers: Sit to/from Stand Sit to Stand: Supervision           General transfer comment: multiple sit<>stands from EOB and rollator seat with supervision; cues to push rollator against wall/counter to prevent it from sliding    Ambulation/Gait Ambulation/Gait assistance: Supervision Gait Distance (Feet): 60 Feet (+ 25' + 20') Assistive device: Rollator (4 wheels) Gait Pattern/deviations: Step-through pattern, Decreased stride length, Trunk flexed Gait velocity: Decreased     General Gait Details: very slow, fatigued gait with rollator and supervision for safety/lines; pt requesting 2x seated rest break secondary to fatigue and SOB   Stairs             Wheelchair Mobility    Modified Rankin (Stroke Patients Only)       Balance Overall balance assessment: Needs assistance Sitting-balance support: Feet supported Sitting balance-Leahy Scale: Good     Standing balance support: Bilateral upper extremity supported Standing balance-Leahy Scale: Fair Standing balance comment: can static stand without DME, preference for rollator                            Cognition Arousal/Alertness: Awake/alert Behavior During Therapy: WFL for tasks assessed/performed Overall Cognitive Status: Within Functional Limits for tasks assessed                                 General Comments: WFL for simple tasks; not  formally assessed        Exercises      General Comments General comments (skin integrity, edema, etc.): increased time spent in discussion and education re; activity reccomendations, energy conservation, increasing activity slowly, and nutrition      Pertinent Vitals/Pain Pain  Assessment Pain Assessment: Faces Faces Pain Scale: Hurts a little bit Pain Location: abdomen Pain Descriptors / Indicators: Aching Pain Intervention(s): Monitored during session    Home Living Family/patient expects to be discharged to:: Private residence Living Arrangements: Children Available Help at Discharge: Family;Available PRN/intermittently Type of Home: House Home Access: Stairs to enter Entrance Stairs-Rails: Right Entrance Stairs-Number of Steps: 5   Home Layout: One level Home Equipment: Rollator (4 wheels);Cane - single point Additional Comments: reports needs new rollator. reports daughter works long days and "gets no time off"    Prior Function            PT Goals (current goals can now be found in the care plan section) Acute Rehab PT Goals Patient Stated Goal: wants more help at home PT Goal Formulation: With patient Time For Goal Achievement: 11/12/21    Frequency    Min 3X/week      PT Plan      Co-evaluation              AM-PAC PT "6 Clicks" Mobility   Outcome Measure  Help needed turning from your back to your side while in a flat bed without using bedrails?: A Little Help needed moving from lying on your back to sitting on the side of a flat bed without using bedrails?: A Little Help needed moving to and from a bed to a chair (including a wheelchair)?: A Little Help needed standing up from a chair using your arms (e.g., wheelchair or bedside chair)?: A Little Help needed to walk in hospital room?: A Little Help needed climbing 3-5 steps with a railing? : A Lot 6 Click Score: 17    End of Session   Activity Tolerance: Patient limited by fatigue Patient left: with call bell/phone within reach;in bed;Other (comment) (seated EOB, MD present) Nurse Communication: Mobility status PT Visit Diagnosis: Other abnormalities of gait and mobility (R26.89)     Time: 7510-2585 PT Time Calculation (min) (ACUTE ONLY): 30 min  Charges:   $Gait Training: 8-22 mins $Therapeutic Activity: 8-22 mins                     [name] R. PTA Acute Rehabilitation Services Office: [place] 10/30/2021, 11:56 AM
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
## 2021-10-30 NOTE — Evaluation (Signed)
Occupational Therapy Evaluation Patient Details Name: [name] MRN: [id number] DOB: 13-Nov-1965 Today's Date: 10/30/2021   History of Present Illness Pt is a 56 y.o. female admitted 10/25/21 with chest pain; workup for NSTEMI, CHF. S/p LHC 8/7 showing severe 3-vessel obstructive CAD. Echo with stable LV EF 55-60%. S/p colonoscopy and EGD 8/8. Pt also c/o R foot pain; MRI consistent with skin blistering. 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   Clinical Impression   Patient admitted for the diagnosis above.  PTA she lives with her daughter who works.  Patient is concerned about ability to complete tasks at home.  Believes she may need additional assist at home while her daughter is at work.  Deficits are listed below, and OT will continue efforts in the acute setting.  [facility] is recommended for post acute rehab to regain full independence.       Recommendations for follow up therapy are one component of a multi-disciplinary discharge planning process, led by the attending physician.  Recommendations may be updated based on patient status, additional functional criteria and insurance authorization.   Follow Up Recommendations  Home health OT    Assistance Recommended at Discharge Intermittent Supervision/Assistance  Patient can return home with the following Assist for transportation;Help with stairs or ramp for entrance;Assistance with cooking/housework    Functional Status Assessment  Patient has had a recent decline in their functional status and demonstrates the ability to make significant improvements in function in a reasonable and predictable amount of time.  Equipment Recommendations  None recommended by OT    Recommendations for Other Services       Precautions / Restrictions Precautions Precautions: Fall Precaution Comments: Watch O2 sats Restrictions Weight Bearing Restrictions: No      Mobility Bed  Mobility               General bed mobility comments: sitting EOB Patient Response: Cooperative  Transfers Overall transfer level: Needs assistance Equipment used: Rollator (4 wheels) Transfers: Sit to/from Stand Sit to Stand: Supervision                  Balance Overall balance assessment: Needs assistance Sitting-balance support: Feet supported Sitting balance-Leahy Scale: Good     Standing balance support: Bilateral upper extremity supported Standing balance-Leahy Scale: Fair                             ADL either performed or assessed with clinical judgement   ADL       Grooming: Wash/dry hands;Wash/dry face;Oral care;Min guard Grooming Details (indicate cue type and reason): increased time and effort         Upper Body Dressing : Minimal assistance;Sitting   Lower Body Dressing: Moderate assistance;Sit to/from stand   Toilet Transfer: Min guard;Ambulation;Rollator (4 wheels)                   Vision Baseline Vision/History: 1 Wears glasses Patient Visual Report: No change from baseline       Perception Perception Perception: Not tested   Praxis Praxis Praxis: Not tested    Pertinent Vitals/Pain Pain Assessment Pain Assessment: Faces Faces Pain Scale: Hurts a little bit Pain Location: abdomen Pain Descriptors / Indicators: Aching Pain Intervention(s): Monitored during session     Hand Dominance Right   Extremity/Trunk Assessment Upper Extremity Assessment Upper Extremity Assessment: Overall WFL for tasks assessed  Lower Extremity Assessment Lower Extremity Assessment: Defer to PT evaluation   Cervical / Trunk Assessment Cervical / Trunk Assessment: Other exceptions Cervical / Trunk Exceptions: body habitus   Communication Communication Communication: No difficulties   Cognition Arousal/Alertness: Awake/alert Behavior During Therapy: WFL for tasks assessed/performed Overall Cognitive Status: Within  Functional Limits for tasks assessed                                       General Comments   VSS on supplemental O2    Exercises     Shoulder Instructions      Home Living Family/patient expects to be discharged to:: Private residence Living Arrangements: Children Available Help at Discharge: Family;Available PRN/intermittently Type of Home: House Home Access: Stairs to enter [employer] of Steps: 5 Entrance Stairs-Rails: Right Home Layout: One level     Bathroom Shower/Tub: Teacher, early years/pre: Standard Bathroom Accessibility: Yes   Home Equipment: Rollator (4 wheels);Cane - single point   Additional Comments: reports needs new rollator. reports daughter works long days and "gets no time off"      Prior Functioning/Environment Prior Level of Function : Independent/Modified Independent             Mobility Comments: typically indep ambulating with and without SPC; having increased difficulty recently due to SOB and fatigue ADLs Comments: typically mod indep, does bird baths at sink, able to prep meals and keep household        OT Problem List: Decreased strength;Impaired balance (sitting and/or standing);Decreased activity tolerance      OT Treatment/Interventions: Self-care/ADL training;Balance training;Therapeutic activities;DME and/or AE instruction;Energy conservation    OT Goals(Current goals can be found in the care plan section) Acute Rehab OT Goals Patient Stated Goal: Return home OT Goal Formulation: With patient Time For Goal Achievement: 11/13/21 Potential to Achieve Goals: Good ADL Goals Pt Will Perform Grooming: with modified independence;standing Pt Will Perform Lower Body Dressing: with modified independence;sit to/from stand;with adaptive equipment Pt Will Transfer to Toilet: with modified independence;ambulating;regular height toilet Pt Will Perform Toileting - Clothing Manipulation and hygiene:  with modified independence;sit to/from stand  OT Frequency: Min 2X/week    Co-evaluation              AM-PAC OT "6 Clicks" Daily Activity     Outcome Measure Help from another person eating meals?: None Help from another person taking care of personal grooming?: A Little Help from another person toileting, which includes using toliet, bedpan, or urinal?: A Little Help from another person bathing (including washing, rinsing, drying)?: A Lot Help from another person to put on and taking off regular upper body clothing?: A Little Help from another person to put on and taking off regular lower body clothing?: A Lot 6 Click Score: 17   End of Session Equipment Utilized During Treatment: Rollator (4 wheels);Oxygen Nurse Communication: Mobility status  Activity Tolerance: Patient limited by fatigue Patient left: in bed;with call bell/phone within reach  OT Visit Diagnosis: Unsteadiness on feet (R26.81)                Time: 4332-9518 OT Time Calculation (min): 23 min Charges:  OT General Charges $OT Visit: 1 Visit OT Evaluation $OT Eval Moderate Complexity: 1 Mod OT Treatments $Self Care/Home Management : 8-22 mins  10/30/2021  RP, OTR/L  Acute Rehabilitation Services  Office:  [phone]   Metta Clines 10/30/2021,  9:52 AM

## 2021-10-30 NOTE — Progress Notes (Addendum)
Advanced Heart Failure Rounding Note  PCP-Cardiologist: None   Subjective:    Fe studies -c/w IDA. B12 and folate normal.   Echo w/ stable LV EF 55-60% with moderate LVH, RV normal.  LHC 8/7 w/ LVEDP 33, stable diffuse coronary disease with SVG-LAD still patent.   Hgb 7.6>7.3 . No obvious source.  Creatinine 2.1>2.8>4.19   Denies SOB.   Objective:   Weight Range: (!) 150.1 kg Body mass index is 51.83 kg/m.   Vital Signs:   Temp:  [98.5 F (36.9 C)-99.6 F (37.6 C)] 98.5 F (36.9 C) (08/10 0325) Pulse Rate:  [80-91] 81 (08/10 0325) Resp:  [13-19] 14 (08/10 0325) BP: (106-147)/(57-67) 107/63 (08/10 0325) SpO2:  [96 %-100 %] 100 % (08/10 0325) Weight:  [150.1 kg] 150.1 kg (08/10 0625) Last BM Date : 10/28/21  Weight change: Filed Weights   10/28/21 0405 10/29/21 0556 10/30/21 0625  Weight: (!) 146.3 kg (!) 147.9 kg (!) 150.1 kg    Intake/Output:   Intake/Output Summary (Last 24 hours) at 10/30/2021 0743 Last data filed at 10/30/2021 0600 Gross per 24 hour  Intake 650 ml  Output 200 ml  Net 450 ml      Physical Exam   CVP 18-19  General:  Sitting on the side of the bed. No resp difficulty HEENT: normal Neck: supple. JVP hard to assess due to body habitus. . Carotids 2+ bilat; no bruits. No lymphadenopathy or thryomegaly appreciated. Cor: PMI nondisplaced. Regular rate & rhythm. No rubs, gallops or murmurs. Lungs: clear Abdomen: soft, nontender, nondistended. No hepatosplenomegaly. No bruits or masses. Good bowel sounds. Extremities: no cyanosis, clubbing, rash, R and LLE 1+ edema. [name] PICC Neuro: alert & orientedx3, cranial nerves grossly intact. moves all 4 extremities w/o difficulty. Affect pleasant   Telemetry   SR 90s personally checked.   EKG    No new EKG for review   Labs    CBC Recent Labs    10/29/21 0500 10/30/21 0500  WBC 8.4 6.8  HGB 7.6* 7.3*  HCT 22.9* 21.7*  MCV 85.1 83.1  PLT 170 161   Basic Metabolic Panel Recent  Labs    10/29/21 0500 10/30/21 0500  NA 138 134*  K 4.1 4.3  CL 103 101  CO2 26 25  GLUCOSE 169* 191*  BUN 33* 46*  CREATININE 2.82* 4.19*  CALCIUM 9.3 9.1  MG 1.8  --    Liver Function Tests No results for input(s): "AST", "ALT", "ALKPHOS", "BILITOT", "PROT", "ALBUMIN" in the last 72 hours.  No results for input(s): "LIPASE", "AMYLASE" in the last 72 hours. Cardiac Enzymes No results for input(s): "CKTOTAL", "CKMB", "CKMBINDEX", "TROPONINI" in the last 72 hours.  BNP: BNP (last 3 results) No results for input(s): "BNP" in the last 8760 hours.  ProBNP (last 3 results) No results for input(s): "PROBNP" in the last 8760 hours.   D-Dimer No results for input(s): "DDIMER" in the last 72 hours. Hemoglobin A1C No results for input(s): "HGBA1C" in the last 72 hours. Fasting Lipid Panel Recent Labs    10/28/21 0754  CHOL 95  HDL 29*  LDLCALC 38  TRIG 139  CHOLHDL 3.3   Thyroid Function Tests No results for input(s): "TSH", "T4TOTAL", "T3FREE", "THYROIDAB" in the last 72 hours.  Invalid input(s): "FREET3"  Other results:   Imaging    No results found.   Medications:     Scheduled Medications:  aspirin EC  81 mg Oral Daily   carvedilol  25 mg Oral  BID WC   Chlorhexidine Gluconate Cloth  6 each Topical Daily   citalopram  20 mg Oral BID   febuxostat  80 mg Oral q morning   fenofibrate  160 mg Oral Daily   icosapent Ethyl  2 g Oral BID   insulin aspart  0-15 Units Subcutaneous TID WC   insulin aspart  0-5 Units Subcutaneous QHS   insulin detemir  14 Units Subcutaneous BID   isosorbide mononitrate  120 mg Oral Daily   pantoprazole  40 mg Oral BID   prasugrel  10 mg Oral Daily   ranolazine  1,000 mg Oral BID   rosuvastatin  5 mg Oral Daily   senna-docusate  1 tablet Oral BID   sodium chloride flush  10-40 mL Intracatheter Q12H   sodium chloride flush  3 mL Intravenous Q12H   sodium chloride flush  3 mL Intravenous Q12H    Infusions:  sodium  chloride      PRN Medications: sodium chloride, acetaminophen, ondansetron **OR** ondansetron (ZOFRAN) IV, oxyCODONE, polyethylene glycol, sodium chloride flush, sodium chloride flush, traMADol, zolpidem    Patient Profile   56 y.o. female with history of CAD s/p CABG followed by mulitple PCIs, chronic diastolic CHF, HLD, obesity, venous insufficiency, recurrent LE wounds, CKD III, HTN, OSA, anemia.    Admitted with NSTEMI, AKI on CKD, acute on chronic anemia and notable RLE wound.  Assessment/Plan   1. CAD:  Very aggressive coronary disease.  Had 4 more drug-eluting stents placed in 12/11, 1 in the SVG-LAD and 3 to open the totally occluded native RCA after occlusion of the SVG-PDA. She was deemed not to be a candidate for re-do CABG at that admission by cardiac surgery. Unfortunately, due to her body habitus, the surgeons were unable to mobilize her LIMA for grafting during her initial CABG operation. Lexiscan Cardiolite in 3/14 showed no ischemia or infarction though study was limited by body habitus.  NSTEMI in 7/20, LHC showed occluded SVG-RCA and native RCA, occluded mLAD, 80% in-stent restenosis in the SVG-LAD, 95% mLCx stenosis.  DES to SVG-LAD, LCx thought to be too small to intervene upon.  NSTEMI 11/21, occluded SVG-LAD treated with PTCA.  NSTEMI this admission with HS-TnI 1711 => 1943.  Cath this admit with chronic occlusion of RCA and SVG-RCA, 70% ostial LAD, occluded mid LAD, patent SVG-LAD, patent SVG-D, stable 80% mLCx and 70% OM1 stenoses (diffuse disease in LCx system).  No target for intervention. Possible rise in TnI due to demand ischemia with anemia and volume overload in the setting of chronic coronary diffuse coronary disease.  - Has been on Effient long-term since she is a poor Plavix responder and has multiple DES.   - Continue ASA 81. - Continue Crestor.    - Continue Coreg, Imdur, and ranolazine. Cut back coreg and Ranex cut back with elevated creatinine.      2.  Acute on chronic diastolic CHF: NYHA class III symptoms.  Torsemide recently decreased from 60 bid to 20 bid, unsure why.  This may have triggered volume overload/CHF.  Echo this admission with EF 55-60%, moderate LVH, normal RV.  LVEDP 33 on LHC.  Fluid overloaded on exam, CVP 17-18.  - Worsening renal function. Consult nephrology.  - Wound not use SGLT2-inhibitor candidate given body habitus (risk of UTIs/yeast infections).  3. Hyperlipidemia: She is on Vascepa, Crestor, and fenofibrate. Check lipid panel today  4. Obesity: Ideally would start semaglutide or dulaglutide.  5.Venous insufficiency:  ABIs normal last check. Venous  stasis ulcer right foot followed by wound care.  MRI this admission did not show osteomyelitis.  - ABIs: noncompressible R/L 6. CKD: Stage 3/Possible contrast nephropathy.  Creatinine appears to run 2.1 - 2.2 baseline.  Was down to 1.9 but now 4.19, did have contrast with LHC.  - Creatinine continues to rise 2.8 >4.19  - Hold diuretics.  - Consult nephrology   7. OSA: Needs CPAP.  8. Anemia: Hgb down to 7.3, uncertain cause of drop.  No definite overt GI bleeding. Hgb 7.3 today .  - Fe studies c/w IDA. B12 and folate normal.  - Treated w/ feraheme  - FOBT pending  - GI consulted. S/P EGD/ colonoscopy--esophagitis.  - PPI   Length of Stay: 5  Amy Clegg, NP  10/30/2021, 7:43 AM  Advanced Heart Failure Team Pager [phone] (M-F; 7a - 5p)  Please contact [facility] for night-coverage after hours (5p -7a ) and weekends on [email or web address]  Patient seen with NP, agree with the above note.   Creatinine up to 4.19 today, she did not get any diuretics yesterday.  CVP 22 on my read today.  She is short of breath with exertion, comfortable at rest.  SBP 100s-110s.  Hgb lower at 7.3, no overt bleeding.  Co-ox 59%.   General: NAD Neck: Difficult with thick neck but appears elevated, no thyromegaly or thyroid nodule.  Lungs: Clear to auscultation bilaterally with normal  respiratory effort. CV: Nondisplaced PMI.  Heart regular S1/S2, no S3/S4, no murmur. Chronic edema/venous stasis lower legs. Abdomen: Soft, nontender, no hepatosplenomegaly, no distention.  Skin: Intact without lesions or rashes.  Neurologic: Alert and oriented x 3.  Psych: Normal affect. Extremities: No clubbing or cyanosis.  HEENT: Normal.   AKI with poor UOP, BP stable. This is likely contrast-induced nephropathy from cath superimposed on her baseline CKD stage 3. Creatinine up to 4.19 with CVP elevated at 22.  She is comfortable at rest. - I will hold diuretics again today but will likely need to attempt diuresis tomorrow.  Hopefully, creatinine will stabilize.   - With worsening renal function and volume overload, she may end up needing CVVH.  Think we can watch for 1 more day to see if she improves.  Will ask renal to see .  - With AKI, decrease ranolazine to 500 mg daily.  - Will decrease Coreg to 12.5 mg bid to maintain higher BP.    Hgb remains low at 7.3.  No definite source of bleeding found on endoscopic evaluation (?consider capsule endoscopy).  She has had feraheme. Transfuse hgb < 7.   Suspect her admission presentation was diastolic CHF with demand ischemia causing troponin rise.    Loralie Champagne 10/30/2021 9:27 AM

## 2021-10-30 NOTE — Progress Notes (Signed)
[facility] PROGRESS NOTE    Progress Note  [name]  QQP:[id number] DOB: [birth year] DOA: 10/25/2021 PCP: Jacklynn Ganong, MD     Brief Narrative:   [name] is an 56 y.o. female past medical history of CAD s/p CABG in 2010, subsequently underwent angioplasty with a drug-eluting stent in 7124, chronic diastolic heart failure, chronic kidney disease stage IV, diabetes mellitus type 2, essential hypertension anemia of chronic disease paroxysmal atrial fibrillation, who presented to [facility] with complaints of 3 days of intermittent chest pain radiating to her left arm intermittent associated with nausea and shortness of breath.  Had chest pain hospital cardiac markers were elevated chest x-ray showed pulmonary edema placed on IV heparin and nitroglycerin transferred to [facility] for cardiology evaluation   Significant Events: Cardiology Heart failure team [facility]   Assessment/Plan:   Acute NSTEMI (non-ST elevated myocardial infarction) [facility]) Cardiac cath on 10/27/2021 showed diffuse coronary artery disease no need for intervention. She was continue on aspirin, Effient Imdur, statins, Ranexa and Coreg. Cardiology decreased dose of Coreg and Ranexa due to elevation in her creatinine.  Acute on chronic heart failure with preserved ejection fraction (HFpEF) (Circle) Admitting x-ray showed pulmonary edema was gently diuresed but then required volume resuscitation due to renal dysfunction. Echo on this admission showed an EF of 55% Right failure team is consulting and they are titrating diuretics. PICC line was placed to monitor CVP due to her body habitus. Currently on Coreg, Imdur. Further diuretic management per advanced heart failure team, positive about 2.8 L.  Acute kidney injury chronic kidney disease stage IV: With a baseline creatinine of around 2.3.  Improved with fluid resuscitation. Today for concern about contrast nephropathy. Hold diuretics, nephrology  consulted  Wound of the right foot with a history of post first toe amputation: X-ray did not show any osteomyelitis. MRI of the foot showed new superficial fluid collection in the plantar medial aspect consistent with skin blistering with a deeper component extending towards the flexor longus but no evidence of tenosynovitis, no septic arthritis or osteomyelitis. Remained afebrile with no leukocytosis. Blister has spontaneously rupture will continue wound care.  Essential hypertension See heart failure for medications.  Diabetes mellitus type 2: With an A1c of 6.9 patient has been reluctant to adjust her insulin despite that her blood glucose remains poorly controlled. Blood glucose continues to be poorly controlled despite adjusting her insulin. She is now in renal failure her insulins requirements will be less we will continue to monitor closely.  Anemia of chronic renal disease/anemia of chronic disease: Due to her NSTEMI was placed on IV heparin, 2022 her hemoglobin was 1, was drifting down. GI was consulted on 10/28/2020; EGD showed grade C esophagitis no bleeding, gastritis with overlying hematin biopsies were taken.  GI recommended Protonix p.o. twice daily. Colonoscopy-showed 2 polyps which were removed diverticulosis no evidence of bleeding, some internal hemorrhoids. Hemoglobin has been fluctuating around 7.3-7.6, no evidence of bleeding.  Esophagitis/gastritis: Diagnosed by EGD on 10/28/2021. Protonix p.o. twice daily. Will need repeat EGD in 12 weeks, further management per GI.  Obstructive sleep apnea: Noncompliant with BiPAP while in house.  Hyperlipidemia associated with diabetes mellitus type 2: Continue statins.   DVT prophylaxis: scd Family Communication:none Status is: Inpatient Remains inpatient appropriate because: Acute heart failure now with a GI bleed.    Code Status:     Code Status Orders  (From admission, onward)           Start  Ordered    10/25/21 0059  Full code  Continuous        10/25/21 0101           Code Status History     Date Active Date Inactive Code Status Order ID Comments User Context   10/01/2018 2219 10/17/2018 2118 Full Code [id number]  Nila Nephew, MD Inpatient         IV Access:   Peripheral IV   Procedures and diagnostic studies:   VAS [place] ABI WITH/WO TBI  Result Date: 10/28/2021  LOWER EXTREMITY DOPPLER STUDY Patient Name:  [name]  Date of Exam:   10/28/2021 Medical Rec #: [id number]       Accession #:    [id number] Date of Birth: 05-31-1965        Patient Gender: F Patient Age:   73 years Exam Location:  [facility] Procedure:      VAS [place] ABI WITH/WO TBI Referring Phys: Collier Salina [place] --------------------------------------------------------------------------------  Indications: Venous stasis ulceration, non-healing wounds. Other Factors: S/P Right great toe amputation.  Limitations: Today's exam was limited due to skin changes and body habitus. Comparison Study: 06-11-2021 Prior arterial duplex lower extremity performed at                   outside facility showed no eidence of hemodynamically                   significant obstruction bilaterally                    01-17-2021 Prior ABI performed at outside facility Performing Technologist: Darlin Coco RDMS RVT  Examination Guidelines: A complete evaluation includes at minimum, Doppler waveform signals and systolic blood pressure reading at the level of bilateral brachial, anterior tibial, and posterior tibial arteries, when vessel segments are accessible. Bilateral testing is considered an integral part of a complete examination. Photoelectric Plethysmograph (PPG) waveforms and toe systolic pressure readings are included as required and additional duplex testing as needed. Limited examinations for reoccurring indications may be performed as noted.  ABI Findings:  +---------+------------------+-----+--------+----------------------------------+ Right    Rt Pressure (mmHg)IndexWaveformComment                            +---------+------------------+-----+--------+----------------------------------+ Brachial                                PICC line- unable to                                                       insonate/obtain pressure           +---------+------------------+-----+--------+----------------------------------+ PTA      255                    biphasic                                   +---------+------------------+-----+--------+----------------------------------+ DP       255                    biphasic                                   +---------+------------------+-----+--------+----------------------------------+  Great Toe                               Right toe amputation               +---------+------------------+-----+--------+----------------------------------+ +---------+------------------+-----+---------+---------------------------------+ Left     Lt Pressure (mmHg)IndexWaveform Comment                           +---------+------------------+-----+---------+---------------------------------+ Brachial                        triphasicExtra long cuff inflated to full                                           capacity; however, waveform                                                remained present                  +---------+------------------+-----+---------+---------------------------------+ PTA      255                             Unable to obtain signal secondary                                          to skin changes                   +---------+------------------+-----+---------+---------------------------------+ DP       255                    triphasic                                  +---------+------------------+-----+---------+---------------------------------+  Gardiner Rhyme                                                               +---------+------------------+-----+---------+---------------------------------+ Arterial wall calcification precludes accurate ankle pressures and ABIs.  Summary: Right: Resting right ankle-brachial index indicates noncompressible right lower extremity arteries. Left: Resting left ankle-brachial index indicates noncompressible left lower extremity arteries. *See table(s) above for measurements and observations.  Electronically signed by Deitra Mayo MD on 10/28/2021 at 7:14:54 PM.    Final      Medical Consultants:   None.   Subjective:    [name] not in a good mood this morning after telling her her renal function has deteriorated.  Objective:    Vitals:   10/29/21 2325 10/30/21 0325 10/30/21 0625 10/30/21 0754  BP: 115/62 107/63  131/62  Pulse: 80 81  94  Resp: '13 14  18  '$ Temp: 98.6 F (37 C) 98.5 F (36.9 C)  98.3 F (36.8 C)  TempSrc:  Oral Oral  Oral  SpO2: 100% 100%  100%  Weight:   (!) 150.1 kg   Height:       SpO2: 100 % O2 Flow Rate (L/min): 2 L/min   Intake/Output Summary (Last 24 hours) at 10/30/2021 0920 Last data filed at 10/30/2021 0600 Gross per 24 hour  Intake 410 ml  Output 200 ml  Net 210 ml    Filed Weights   10/28/21 0405 10/29/21 0556 10/30/21 0625  Weight: (!) 146.3 kg (!) 147.9 kg (!) 150.1 kg    Exam: General exam: In no acute distress. Respiratory system: Good air movement and clear to auscultation. Cardiovascular system: S1 & S2 heard, RRR. No JVD. Gastrointestinal system: Abdomen is nondistended, soft and nontender.  Extremities: No pedal edema. Skin: No rashes, lesions or ulcers Psychiatry: Judgement and insight appear normal. Mood & affect appropriate.   Data Reviewed:    Labs: Basic Metabolic Panel: Recent Labs  Lab 10/25/21 0146 10/26/21 0248 10/27/21 0600 10/28/21 0754 10/28/21 1400 10/29/21 0500 10/30/21 0500  NA 143   < >  140 141 141 138 134*  K 4.0   < > 3.6 4.0 4.1 4.1 4.3  CL 107   < > 110 105 107 103 101  CO2 29   < > '26 27 26 26 25  '$ GLUCOSE 91   < > 170* 227* 189* 169* 191*  BUN 30*   < > 28* 28* 28* 33* 46*  CREATININE 2.13*   < > 1.93* 2.15* 2.11* 2.82* 4.19*  CALCIUM 9.1   < > 8.0* 9.4 9.3 9.3 9.1  MG 1.9  --   --   --   --  1.8  --    < > = values in this interval not displayed.    GFR Estimated Creatinine Clearance: 22.7 mL/min (A) (by C-G formula based on SCr of 4.19 mg/dL (H)). Liver Function Tests: Recent Labs  Lab 10/25/21 0146 10/26/21 0248  AST 34 32  ALT 20 20  ALKPHOS 38 35*  BILITOT 0.9 0.6  PROT 6.4* 6.3*  ALBUMIN 2.8* 2.8*    No results for input(s): "LIPASE", "AMYLASE" in the last 168 hours. No results for input(s): "AMMONIA" in the last 168 hours. Coagulation profile Recent Labs  Lab 10/25/21 0146  INR 1.3*    COVID-19 Labs  No results for input(s): "DDIMER", "FERRITIN", "LDH", "CRP" in the last 72 hours.   Lab Results  Component Value Date   [place] NEGATIVE 10/06/2018    CBC: Recent Labs  Lab 10/26/21 2247 10/27/21 0600 10/28/21 0754 10/29/21 0500 10/30/21 0500  WBC 7.5 6.4 7.9 8.4 6.8  HGB 7.9* 7.3* 8.2* 7.6* 7.3*  HCT 24.4* 22.1* 25.5* 22.9* 21.7*  MCV 85.0 85.7 84.4 85.1 83.1  PLT 190 173 200 170 167    Cardiac Enzymes: No results for input(s): "CKTOTAL", "CKMB", "CKMBINDEX", "TROPONINI" in the last 168 hours. BNP (last 3 results) No results for input(s): "PROBNP" in the last 8760 hours. CBG: Recent Labs  Lab 10/29/21 0609 10/29/21 1124 10/29/21 1715 10/29/21 2122 10/30/21 0610  GLUCAP 165* 225* 171* 301* 222*    D-Dimer: No results for input(s): "DDIMER" in the last 72 hours. Hgb A1c: No results for input(s): "HGBA1C" in the last 72 hours. Lipid Profile: Recent Labs    10/28/21 0754  CHOL 95  HDL 29*  LDLCALC 38  TRIG 139  CHOLHDL 3.3    Thyroid function studies: No results for input(s): "TSH", "T4TOTAL",  "T3FREE", "THYROIDAB" in the  last 72 hours.  Invalid input(s): "FREET3" Anemia work up: No results for input(s): "VITAMINB12", "FOLATE", "FERRITIN", "TIBC", "IRON", "RETICCTPCT" in the last 72 hours.  Sepsis Labs: Recent Labs  Lab 10/27/21 0600 10/28/21 0754 10/29/21 0500 10/30/21 0500  WBC 6.4 7.9 8.4 6.8    Microbiology No results found for this or any previous visit (from the past 240 hour(s)).   Medications:    aspirin EC  81 mg Oral Daily   carvedilol  12.5 mg Oral BID WC   Chlorhexidine Gluconate Cloth  6 each Topical Daily   citalopram  20 mg Oral BID   febuxostat  80 mg Oral q morning   fenofibrate  160 mg Oral Daily   icosapent Ethyl  2 g Oral BID   insulin aspart  0-15 Units Subcutaneous TID WC   insulin aspart  0-5 Units Subcutaneous QHS   insulin detemir  14 Units Subcutaneous BID   isosorbide mononitrate  120 mg Oral Daily   pantoprazole  40 mg Oral BID   prasugrel  10 mg Oral Daily   ranolazine  500 mg Oral Daily   rosuvastatin  5 mg Oral Daily   senna-docusate  1 tablet Oral BID   sodium chloride flush  10-40 mL Intracatheter Q12H   sodium chloride flush  3 mL Intravenous Q12H   sodium chloride flush  3 mL Intravenous Q12H   Continuous Infusions:  sodium chloride        LOS: 5 days   Charlynne Cousins  [facility]  10/30/2021, 9:20 AM

## 2021-10-31 ENCOUNTER — Encounter: Payer: Self-pay | Admitting: Internal Medicine

## 2021-10-31 DIAGNOSIS — I5033 Acute on chronic diastolic (congestive) heart failure: Secondary | ICD-10-CM | POA: Diagnosis not present

## 2021-10-31 DIAGNOSIS — I214 Non-ST elevation (NSTEMI) myocardial infarction: Secondary | ICD-10-CM | POA: Diagnosis not present

## 2021-10-31 LAB — URINALYSIS, ROUTINE W REFLEX MICROSCOPIC
Bacteria, UA: NONE SEEN
Bilirubin Urine: NEGATIVE
Glucose, UA: NEGATIVE mg/dL
Hgb urine dipstick: NEGATIVE
Ketones, ur: NEGATIVE mg/dL
Nitrite: NEGATIVE
Protein, ur: NEGATIVE mg/dL
Specific Gravity, Urine: 1.014 (ref 1.005–1.030)
pH: 5 (ref 5.0–8.0)

## 2021-10-31 LAB — BASIC METABOLIC PANEL
Anion gap: 9 (ref 5–15)
BUN: 62 mg/dL — ABNORMAL HIGH (ref 6–20)
CO2: 24 mmol/L (ref 22–32)
Calcium: 9.2 mg/dL (ref 8.9–10.3)
Chloride: 100 mmol/L (ref 98–111)
Creatinine, Ser: 4.92 mg/dL — ABNORMAL HIGH (ref 0.44–1.00)
GFR, Estimated: 10 mL/min — ABNORMAL LOW (ref >60)
Glucose, Bld: 205 mg/dL — ABNORMAL HIGH (ref 70–99)
Potassium: 4.3 mmol/L (ref 3.5–5.1)
Sodium: 133 mmol/L — ABNORMAL LOW (ref 135–145)

## 2021-10-31 LAB — GLUCOSE, CAPILLARY
Glucose-Capillary: 217 mg/dL — ABNORMAL HIGH (ref 70–99)
Glucose-Capillary: 207 mg/dL — ABNORMAL HIGH (ref 70–99)
Glucose-Capillary: 204 mg/dL — ABNORMAL HIGH (ref 70–99)
Glucose-Capillary: 206 mg/dL — ABNORMAL HIGH (ref 70–99)

## 2021-10-31 LAB — COOXEMETRY PANEL
Carboxyhemoglobin: 1.8 % — ABNORMAL HIGH (ref 0.5–1.5)
Methemoglobin: <0.7 % (ref 0.0–1.5)
O2 Saturation: 55.9 %
Total hemoglobin: 7.5 g/dL — ABNORMAL LOW (ref 12.0–16.0)

## 2021-10-31 LAB — CBC
HCT: 22.3 % — ABNORMAL LOW (ref 36.0–46.0)
Hemoglobin: 7.1 g/dL — ABNORMAL LOW (ref 12.0–15.0)
MCH: 27.1 pg (ref 26.0–34.0)
MCHC: 31.8 g/dL (ref 30.0–36.0)
MCV: 85.1 fL (ref 80.0–100.0)
Platelets: 175 10*3/uL (ref 150–400)
RBC: 2.62 MIL/uL — ABNORMAL LOW (ref 3.87–5.11)
RDW: 17.2 % — ABNORMAL HIGH (ref 11.5–15.5)
WBC: 7.5 10*3/uL (ref 4.0–10.5)
nRBC: 1.1 % — ABNORMAL HIGH (ref 0.0–0.2)

## 2021-10-31 LAB — SODIUM, URINE, RANDOM: Sodium, Ur: 23 mmol/L

## 2021-10-31 LAB — CREATININE, URINE, RANDOM: Creatinine, Urine: 122 mg/dL

## 2021-10-31 LAB — SURGICAL PATHOLOGY

## 2021-10-31 MED ORDER — OMEPRAZOLE 20 MG PO CPDR
20.0000 mg | DELAYED_RELEASE_CAPSULE | Freq: Every day | ORAL | Status: DC
  Administered 2021-10-31 – 2021-11-05 (×6): 20 mg via ORAL
  Filled 2021-10-31 (×7): qty 1

## 2021-10-31 MED ORDER — INSULIN DETEMIR 100 UNIT/ML ~~LOC~~ SOLN
16.0000 [IU] | Freq: Two times a day (BID) | SUBCUTANEOUS | Status: DC
  Administered 2021-10-31: 16 [IU] via SUBCUTANEOUS
  Filled 2021-10-31 (×3): qty 0.16

## 2021-10-31 MED ORDER — ICOSAPENT ETHYL 1 G PO CAPS
2.0000 g | ORAL_CAPSULE | Freq: Every day | ORAL | Status: DC
  Administered 2021-10-31 – 2021-11-05 (×6): 2 g via ORAL
  Filled 2021-10-31 (×6): qty 2

## 2021-10-31 MED ORDER — FUROSEMIDE 10 MG/ML IJ SOLN
120.0000 mg | Freq: Two times a day (BID) | INTRAVENOUS | Status: AC
  Administered 2021-10-31 (×2): 120 mg via INTRAVENOUS
  Filled 2021-10-31 (×2): qty 12

## 2021-10-31 MED ORDER — INSULIN ASPART 100 UNIT/ML IJ SOLN
3.0000 [IU] | Freq: Three times a day (TID) | INTRAMUSCULAR | Status: DC
  Administered 2021-10-31 – 2021-11-04 (×5): 3 [IU] via SUBCUTANEOUS

## 2021-10-31 NOTE — Progress Notes (Addendum)
Advanced Heart Failure Rounding Note  PCP-Cardiologist: None   Subjective:    Fe studies -c/w IDA. B12 and folate normal.   Echo w/ stable LV EF 55-60% with moderate LVH, RV normal.  LHC 8/7 w/ LVEDP 33, stable diffuse coronary disease with SVG-LAD still patent.   Hgb 7.6>7.3>7.1 . No obvious source.  Creatinine 2.1>2.8>4.19>4.9   Walked in the hall with therapy. SOB with exertion.     Objective:   Weight Range: (!) 150.4 kg Body mass index is 51.93 kg/m.   Vital Signs:   Temp:  [98.4 F (36.9 C)-98.9 F (37.2 C)] 98.9 F (37.2 C) (08/11 0814) Pulse Rate:  [77-89] 89 (08/11 0814) Resp:  [14-19] 14 (08/11 0814) BP: (123-154)/(51-80) 149/58 (08/11 0814) SpO2:  [96 %-100 %] 100 % (08/11 0814) Weight:  [150.4 kg] 150.4 kg (08/11 0419) Last BM Date : 10/30/21  Weight change: Filed Weights   10/29/21 0556 10/30/21 0625 10/31/21 0419  Weight: (!) 147.9 kg (!) 150.1 kg (!) 150.4 kg    Intake/Output:   Intake/Output Summary (Last 24 hours) at 10/31/2021 0843 Last data filed at 10/31/2021 0200 Gross per 24 hour  Intake 243 ml  Output 325 ml  Net -82 ml      Physical Exam   CVP 19 General:  Sitting on the side of the bed.  No resp difficulty HEENT: normal Neck: supple. JVD. Carotids 2+ bilat; no bruits. No lymphadenopathy or thryomegaly appreciated. Cor: PMI nondisplaced. Regular rate & rhythm. No rubs, gallops or murmurs. Lungs: clear on room air.  Abdomen: soft, nontender, nondistended. No hepatosplenomegaly. No bruits or masses. Good bowel sounds. Extremities: no cyanosis, clubbing, rash, R and LLE trace-1 +edema. [name] PICC  Neuro: alert & orientedx3, cranial nerves grossly intact. moves all 4 extremities w/o difficulty. Affect pleasant  Telemetry   SR 80-90s personally checked   EKG    No new EKG for review   Labs    CBC Recent Labs    10/30/21 0500 10/31/21 0640  WBC 6.8 7.5  HGB 7.3* 7.1*  HCT 21.7* 22.3*  MCV 83.1 85.1  PLT 167 355    Basic Metabolic Panel Recent Labs    10/29/21 0500 10/30/21 0500 10/31/21 0640  NA 138 134* 133*  K 4.1 4.3 4.3  CL 103 101 100  CO2 '26 25 24  '$ GLUCOSE 169* 191* 205*  BUN 33* 46* 62*  CREATININE 2.82* 4.19* 4.92*  CALCIUM 9.3 9.1 9.2  MG 1.8  --   --    Liver Function Tests No results for input(s): "AST", "ALT", "ALKPHOS", "BILITOT", "PROT", "ALBUMIN" in the last 72 hours.  No results for input(s): "LIPASE", "AMYLASE" in the last 72 hours. Cardiac Enzymes No results for input(s): "CKTOTAL", "CKMB", "CKMBINDEX", "TROPONINI" in the last 72 hours.  BNP: BNP (last 3 results) No results for input(s): "BNP" in the last 8760 hours.  ProBNP (last 3 results) No results for input(s): "PROBNP" in the last 8760 hours.   D-Dimer No results for input(s): "DDIMER" in the last 72 hours. Hemoglobin A1C No results for input(s): "HGBA1C" in the last 72 hours. Fasting Lipid Panel No results for input(s): "CHOL", "HDL", "LDLCALC", "TRIG", "CHOLHDL", "LDLDIRECT" in the last 72 hours.  Thyroid Function Tests No results for input(s): "TSH", "T4TOTAL", "T3FREE", "THYROIDAB" in the last 72 hours.  Invalid input(s): "FREET3"  Other results:   Imaging    US RENAL  Result Date: 10/30/2021 CLINICAL DATA:  Acute kidney injury EXAM: RENAL / URINARY TRACT  ULTRASOUND COMPLETE COMPARISON:  None Available. FINDINGS: Right Kidney: Renal measurements: 12 x 5 x 6 cm = volume: 180 mL. Echogenicity within normal limits. No mass or hydronephrosis visualized. Left Kidney: Renal measurements: 10 x 6 x 4 cm = volume: 130 mL. Echogenicity within normal limits. No mass or hydronephrosis visualized. The kidney is likely underestimated due to difficult visualization from overlapping shadowing structures. No visible cortex thinning or other cause for asymmetric renal size. Bladder: Not visualized Other: Incidental note of numerous gallstones. IMPRESSION: 1. No hydronephrosis. 2. Difficult visualization of the  left kidney with size likely underestimated. No suspected renal atrophy. 3. Incidental cholelithiasis. Electronically Signed   By: Jorje Guild M.D.   On: 10/30/2021 14:48     Medications:     Scheduled Medications:  aspirin EC  81 mg Oral Daily   carvedilol  12.5 mg Oral BID WC   Chlorhexidine Gluconate Cloth  6 each Topical Daily   citalopram  20 mg Oral BID   febuxostat  80 mg Oral q morning   fenofibrate  160 mg Oral Daily   icosapent Ethyl  2 g Oral BID   insulin aspart  0-15 Units Subcutaneous TID WC   insulin aspart  0-5 Units Subcutaneous QHS   insulin detemir  14 Units Subcutaneous BID   isosorbide mononitrate  120 mg Oral Daily   pantoprazole  40 mg Oral BID   prasugrel  10 mg Oral Daily   ranolazine  500 mg Oral Daily   rosuvastatin  5 mg Oral Daily   senna-docusate  1 tablet Oral BID    Infusions:    PRN Medications: acetaminophen, ondansetron **OR** ondansetron (ZOFRAN) IV, oxyCODONE, polyethylene glycol, traMADol, zolpidem    Patient Profile   56 y.o. female with history of CAD s/p CABG followed by mulitple PCIs, chronic diastolic CHF, HLD, obesity, venous insufficiency, recurrent LE wounds, CKD III, HTN, OSA, anemia.    Admitted with NSTEMI, AKI on CKD, acute on chronic anemia and notable RLE wound.  Assessment/Plan   1. CAD:  Very aggressive coronary disease.  Had 4 more drug-eluting stents placed in 12/11, 1 in the SVG-LAD and 3 to open the totally occluded native RCA after occlusion of the SVG-PDA. She was deemed not to be a candidate for re-do CABG at that admission by cardiac surgery. Unfortunately, due to her body habitus, the surgeons were unable to mobilize her LIMA for grafting during her initial CABG operation. Lexiscan Cardiolite in 3/14 showed no ischemia or infarction though study was limited by body habitus.  NSTEMI in 7/20, LHC showed occluded SVG-RCA and native RCA, occluded mLAD, 80% in-stent restenosis in the SVG-LAD, 95% mLCx stenosis.   DES to SVG-LAD, LCx thought to be too small to intervene upon.  NSTEMI 11/21, occluded SVG-LAD treated with PTCA.  NSTEMI this admission with HS-TnI 1711 => 1943.  Cath this admit with chronic occlusion of RCA and SVG-RCA, 70% ostial LAD, occluded mid LAD, patent SVG-LAD, patent SVG-D, stable 80% mLCx and 70% OM1 stenoses (diffuse disease in LCx system).  No target for intervention. Possible rise in TnI due to demand ischemia with anemia and volume overload in the setting of chronic coronary diffuse coronary disease.  - No chest pain.  - Has been on Effient long-term since she is a poor Plavix responder and has multiple DES.   - Continue ASA 81. - Continue Crestor.    - Continue Coreg, Imdur, and ranolazine. Continue lower dose of coreg and Ranex  elevated creatinine.  2. Acute on chronic diastolic CHF: NYHA class III symptoms.  Torsemide recently decreased from 60 bid to 20 bid, unsure why.  This may have triggered volume overload/CHF.  Echo this admission with EF 55-60%, moderate LVH, normal RV.  LVEDP 33 on LHC.  Fluid overloaded on exam, CVP 19. Holding diuretics.  - Worsening renal function. Nephrology following. - Wound not use SGLT2-inhibitor candidate given body habitus (risk of UTIs/yeast infections).  3. Hyperlipidemia: She is on Vascepa, Crestor, and fenofibrate. Check lipid panel today  4. Obesity: Ideally would start semaglutide or dulaglutide.  5.Venous insufficiency:  ABIs normal last check. Venous stasis ulcer right foot followed by wound care.  MRI this admission did not show osteomyelitis.  - ABIs: noncompressible R/L 6. CKD: Stage 3/Possible contrast nephropathy.  Creatinine appears to run 2.1 - 2.2 baseline.  Was down to 1.9 but now 4.9, did have contrast with LHC.  -Renal US- no hydronephrosis.  - Creatinine continues to rise 2.8 >4.19 >4.9 - Hold diuretics.  - Nephrology consulted.   7. OSA: Needs CPAP.  8. Anemia: Hgb down to 7.3, uncertain cause of drop.  No definite  overt GI bleeding. Hgb 7.1 today .  - Fe studies c/w IDA. B12 and folate normal.  - Treated w/ feraheme  - FOBT pending  - GI consulted. S/P EGD/ colonoscopy--esophagitis.  - PPI  Will need PT/OT at d/c    Length of Stay: [place], NP  10/31/2021, 8:43 AM  Advanced Heart Failure Team Pager [phone] (M-F; 7a - 5p)  Please contact [facility] for night-coverage after hours (5p -7a ) and weekends on [email or web address]  Patient seen with NP, agree with the above note.   Creatinine up to 4.9, CVP 19. Hgb lower at 7.1.  She was short of breath yesterday walking with PT.   General: NAD Neck: Thick, JVP difficult, no thyromegaly or thyroid nodule.  Lungs: Clear to auscultation bilaterally with normal respiratory effort. CV: Nondisplaced PMI.  Heart regular S1/S2, no S3/S4, no murmur.  1+ chronic edema to knees.  Abdomen: Soft, nontender, no hepatosplenomegaly, no distention.  Skin: Intact without lesions or rashes.  Neurologic: Alert and oriented x 3.  Psych: Normal affect. Extremities: No clubbing or cyanosis.  HEENT: Normal.   AKI with poor UOP, BP stable. This is likely contrast-induced nephropathy from cath superimposed on her baseline CKD stage 3. Creatinine up to 4.9 with CVP elevated at 19-20.  She is comfortable at rest but short of breath with exertion.  BP is stable.  - Will try Lasix 120 mg IV x 2 doses today.   - With worsening renal function and volume overload, she may end up needing CVVH.  Suspect we will be getting close if she does not respond to Lasix today.   Hgb remains low at 7.1.  No definite source of bleeding found on endoscopic evaluation (?consider capsule endoscopy).  She has had feraheme. Transfuse hgb < 7.    Suspect her admission presentation was diastolic CHF with demand ischemia causing troponin rise.    Loralie Champagne 10/31/2021 9:30 AM

## 2021-10-31 NOTE — Progress Notes (Signed)
Mobility Specialist: Progress Note   10/31/21 1738  Mobility  Activity Ambulated with assistance in hallway  Level of Assistance Standby assist, set-up cues, supervision of patient - no hands on  Assistive Device Four wheel walker  Distance Ambulated (ft) 40 ft (20'x2)  Activity Response Tolerated fair  $Mobility charge 1 Mobility   Pt received in the bed and agreeable to mobility. Stopped x1 for seated break secondary to general fatigue, otherwise asymptomatic. Pt back to bed with call bell and phone in reach.   [facility] Jshaun Abernathy Mobility Specialist Mobility Specialist [address]: [phone]

## 2021-10-31 NOTE — Progress Notes (Signed)
Patient ID: Jil Penland, female   DOB: [birth year], 56 y.o.   MRN: [id number] S: No new complaints. O:BP 114/63 (BP Location: Left Arm)   Pulse 82   Temp 98.4 F (36.9 C) (Oral)   Resp 20   Ht '5\' 7"'$  (1.702 m)   Wt (!) 150.4 kg   SpO2 95%   BMI 51.93 kg/m   Intake/Output Summary (Last 24 hours) at 10/31/2021 1403 Last data filed at 10/31/2021 1318 Gross per 24 hour  Intake 480 ml  Output 625 ml  Net -145 ml   Intake/Output: I/O last 3 completed shifts: In: 243 [P.O.:240; I.V.:3] Out: 525 [Urine:525]  Intake/Output this shift:  Total I/O In: 240 [P.O.:240] Out: 500 [Urine:500] Weight change: 0.305 kg Gen: NAD CVS: RRR Resp: diminished BS bilaterally Abd: obese Ext: 2+ brawny edema bilaterally with chronic venous stasis changes  Recent Labs  Lab 10/25/21 0146 10/26/21 0248 10/26/21 2247 10/27/21 0600 10/28/21 0754 10/28/21 1400 10/29/21 0500 10/30/21 0500 10/31/21 0640  NA 143 137 139 140 141 141 138 134* 133*  K 4.0 4.6 4.2 3.6 4.0 4.1 4.1 4.3 4.3  CL 107 101 104 110 105 107 103 101 100  CO'2 29 30 28 26 27 26 26 25 24 '$  GLUCOSE 91 236* 264* 170* 227* 189* 169* 191* 205*  BUN 30* 34* 34* 28* 28* 28* 33* 46* 62*  CREATININE 2.13* 2.71* 2.31* 1.93* 2.15* 2.11* 2.82* 4.19* 4.92*  ALBUMIN 2.8* 2.8*  --   --   --   --   --   --   --   CALCIUM 9.1 9.0 9.1 8.0* 9.4 9.3 9.3 9.1 9.2  AST 34 32  --   --   --   --   --   --   --   ALT 20 20  --   --   --   --   --   --   --    Liver Function Tests: Recent Labs  Lab 10/25/21 0146 10/26/21 0248  AST 34 32  ALT 20 20  ALKPHOS 38 35*  BILITOT 0.9 0.6  PROT 6.4* 6.3*  ALBUMIN 2.8* 2.8*   No results for input(s): "LIPASE", "AMYLASE" in the last 168 hours. No results for input(s): "AMMONIA" in the last 168 hours. CBC: Recent Labs  Lab 10/27/21 0600 10/28/21 0754 10/29/21 0500 10/30/21 0500 10/31/21 0640  WBC 6.4 7.9 8.4 6.8 7.5  HGB 7.3* 8.2* 7.6* 7.3* 7.1*  HCT 22.1* 25.5* 22.9* 21.7* 22.3*  MCV 85.7 84.4  85.1 83.1 85.1  PLT 173 200 170 167 175   Cardiac Enzymes: No results for input(s): "CKTOTAL", "CKMB", "CKMBINDEX", "TROPONINI" in the last 168 hours. CBG: Recent Labs  Lab 10/30/21 1206 10/30/21 1729 10/30/21 2115 10/31/21 0613 10/31/21 1254  GLUCAP 217* 197* 191* 217* 207*    Iron Studies: No results for input(s): "IRON", "TIBC", "TRANSFERRIN", "FERRITIN" in the last 72 hours. Studies/Results: US RENAL  Result Date: 10/30/2021 CLINICAL DATA:  Acute kidney injury EXAM: RENAL / URINARY TRACT ULTRASOUND COMPLETE COMPARISON:  None Available. FINDINGS: Right Kidney: Renal measurements: 12 x 5 x 6 cm = volume: 180 mL. Echogenicity within normal limits. No mass or hydronephrosis visualized. Left Kidney: Renal measurements: 10 x 6 x 4 cm = volume: 130 mL. Echogenicity within normal limits. No mass or hydronephrosis visualized. The kidney is likely underestimated due to difficult visualization from overlapping shadowing structures. No visible cortex thinning or other cause for asymmetric renal size. Bladder: Not visualized Other:  Incidental note of numerous gallstones. IMPRESSION: 1. No hydronephrosis. 2. Difficult visualization of the left kidney with size likely underestimated. No suspected renal atrophy. 3. Incidental cholelithiasis. Electronically Signed   By: Jorje Guild M.D.   On: 10/30/2021 14:48    aspirin EC  81 mg Oral Daily   carvedilol  12.5 mg Oral BID WC   Chlorhexidine Gluconate Cloth  6 each Topical Daily   citalopram  20 mg Oral BID   febuxostat  80 mg Oral q morning   fenofibrate  160 mg Oral Daily   icosapent Ethyl  2 g Oral Daily   insulin aspart  0-15 Units Subcutaneous TID WC   insulin aspart  0-5 Units Subcutaneous QHS   insulin aspart  3 Units Subcutaneous TID WC   insulin detemir  14 Units Subcutaneous BID   isosorbide mononitrate  120 mg Oral Daily   omeprazole  20 mg Oral Daily   prasugrel  10 mg Oral Daily   ranolazine  500 mg Oral Daily   rosuvastatin  5  mg Oral Daily   senna-docusate  1 tablet Oral BID    BMET    Component Value Date/Time   NA 133 (L) 10/31/2021 0640   K 4.3 10/31/2021 0640   CL 100 10/31/2021 0640   CO2 24 10/31/2021 0640   GLUCOSE 205 (H) 10/31/2021 0640   BUN 62 (H) 10/31/2021 0640   CREATININE 4.92 (H) 10/31/2021 0640   CALCIUM 9.2 10/31/2021 0640   GFRNONAA 10 (L) 10/31/2021 0640   GFRAA 33 (L) 06/15/2019 1237   CBC    Component Value Date/Time   WBC 7.5 10/31/2021 0640   RBC 2.62 (L) 10/31/2021 0640   HGB 7.1 (L) 10/31/2021 0640   HCT 22.3 (L) 10/31/2021 0640   PLT 175 10/31/2021 0640   MCV 85.1 10/31/2021 0640   MCH 27.1 10/31/2021 0640   MCHC 31.8 10/31/2021 0640   RDW 17.2 (H) 10/31/2021 0640   LYMPHSABS 2.4 02/24/2010 1320   MONOABS 0.6 02/24/2010 1320   EOSABS 0.1 02/24/2010 1320   BASOSABS 0.0 02/24/2010 1320    Assessment/Plan:  AKI/CKD stage IV - likely combination of contrast-induced nephropathy as well as acute on chronic diastolic CHF and cardiorenal syndrome.  UOP has dropped off over the last 24 hours since holding diuretics.  She will need to resume IV lasix and follow her response.  Urine studies consistent with pre-renal (FeNa was 0.59%) and renal US unremarkable.  THis is most consistent with cardiorenal syndrome and will need to restart IV furosemide and follow UOP and renal function.  I did discuss with her the severity and chronicity of her kidney disease, however she simply states "It will be fine" and doesn't want to discuss the likelihood of eventual need for dialysis. Avoid nephrotoxic medications including NSAIDs and iodinated intravenous contrast exposure unless the latter is absolutely indicated.   Preferred narcotic agents for pain control are hydromorphone, fentanyl, and methadone. Morphine should not be used.  Avoid Baclofen and avoid oral sodium phosphate and magnesium citrate based laxatives / bowel preps.  Continue strict Input and Output monitoring.  Will monitor the  patient closely with you and intervene or adjust therapy as indicated by changes in clinical status/labs  NSTEMI - s/p cath without acute change of underlying CAD anatomy. Acute on chronic diastolic CHF - as above.  Restarted IV lasix and follow. Anemia of CKD stage IV - transfuse for Hgb <7.  ESA on hold due to CHF exacerbation. Esophagitis/gastritis -  on protonix. DM type 2 - per primary OSA on CPAP Morbid obesity    Donetta Potts, MD [facility]

## 2021-10-31 NOTE — Progress Notes (Addendum)
[facility] PROGRESS NOTE    Progress Note  [name]  DPO:[id number] DOB: [birth year] DOA: 10/25/2021 PCP: [name] Ganong, MD     Brief Narrative:   [name] is an 56 y.o. female past medical history of CAD s/p CABG in 2010, subsequently underwent angioplasty with a drug-eluting stent in 4315, chronic diastolic heart failure, chronic kidney disease stage IV, diabetes mellitus type 2, essential hypertension anemia of chronic disease paroxysmal atrial fibrillation, who presented to [facility] with complaints of 3 days of intermittent chest pain radiating to her left arm intermittent associated with nausea and shortness of breath.  Had chest pain hospital cardiac markers were elevated chest x-ray showed pulmonary edema placed on IV heparin and nitroglycerin transferred to [facility] for cardiology evaluation   Significant Events: Cardiology Heart failure team [facility]   Assessment/Plan:   Acute NSTEMI (non-ST elevated myocardial infarction) [facility]) Cardiac cath on 10/27/2021 showed diffuse coronary artery disease no need for intervention. She was continue on aspirin, Effient Imdur, statins, Ranexa and Coreg. Cardiology decreased dose of Coreg and Ranexa due to elevation in her creatinine.  Acute on chronic heart failure with preserved ejection fraction (HFpEF) ([place]) Admitting x-ray showed pulmonary edema was gently diuresed but then required volume resuscitation due to renal dysfunction. Echo on this admission showed an EF of 55% Heart failure team is consulting and they are titrating diuretics. PICC line was placed to monitor CVP due to her body habitus. Currently on Coreg, Imdur. Further management per advanced heart failure team, positive about 2.8 L.  Acute kidney injury chronic kidney disease stage IV/Cardiorenal syndrome: With a baseline creatinine of around 2.3.  Improved with fluid resuscitation. Renal ultrasound showed no hydronephrosis. For the management per  nephrology, might need dialysis  Wound of the right foot with a history of post first toe amputation: X-ray did not show any osteomyelitis. MRI of the foot showed new superficial fluid collection in the plantar medial aspect consistent with skin blistering with a deeper component extending towards the flexor longus but no evidence of tenosynovitis, no septic arthritis or osteomyelitis. Remained afebrile with no leukocytosis. Blister has spontaneously rupture will continue wound care.  Essential hypertension See heart failure for medications.  Diabetes mellitus type 2: With an A1c of 6.9 patient has been reluctant to adjust her insulin despite that her blood glucose remains poorly controlled. Blood glucose continues to be poorly controlled despite adjusting her insulin. Blood glucose still trending up we will add with meal coverage.  Anemia of chronic renal disease/anemia of chronic disease: Due to her NSTEMI was placed on IV heparin, 2022 her hemoglobin was 1, was drifting down. GI was consulted on 10/28/2020; EGD showed grade C esophagitis no bleeding, gastritis with overlying hematin biopsies were taken.  GI recommended Protonix p.o. twice daily. Colonoscopy-showed 2 polyps which were removed diverticulosis no evidence of bleeding, some internal hemorrhoids. Hemoglobin has been fluctuating around 7.3-7.6, no evidence of bleeding.  Esophagitis/gastritis: Diagnosed by EGD on 10/28/2021. Protonix p.o. twice daily. Will need repeat EGD in 12 weeks, further management per GI.  Obstructive sleep apnea: Noncompliant with BiPAP while in house.  Hyperlipidemia associated with diabetes mellitus type 2: Continue statins.   DVT prophylaxis: scd Family Communication:none Status is: Inpatient Remains inpatient appropriate because: Acute heart failure now with a GI bleed.    Code Status:     Code Status Orders  (From admission, onward)           Start     Ordered  10/25/21 0059   Full code  Continuous        10/25/21 0101           Code Status History     Date Active Date Inactive Code Status Order ID Comments User Context   10/01/2018 2219 10/17/2018 2118 Full Code [id number]  Nila Nephew, MD Inpatient         IV Access:   Peripheral IV   Procedures and diagnostic studies:   US RENAL  Result Date: 10/30/2021 CLINICAL DATA:  Acute kidney injury EXAM: RENAL / URINARY TRACT ULTRASOUND COMPLETE COMPARISON:  None Available. FINDINGS: Right Kidney: Renal measurements: 12 x 5 x 6 cm = volume: 180 mL. Echogenicity within normal limits. No mass or hydronephrosis visualized. Left Kidney: Renal measurements: 10 x 6 x 4 cm = volume: 130 mL. Echogenicity within normal limits. No mass or hydronephrosis visualized. The kidney is likely underestimated due to difficult visualization from overlapping shadowing structures. No visible cortex thinning or other cause for asymmetric renal size. Bladder: Not visualized Other: Incidental note of numerous gallstones. IMPRESSION: 1. No hydronephrosis. 2. Difficult visualization of the left kidney with size likely underestimated. No suspected renal atrophy. 3. Incidental cholelithiasis. Electronically Signed   By: Jorje Guild M.D.   On: 10/30/2021 14:48     Medical Consultants:   None.   Subjective:    [name] no complaints this morning.  Objective:    Vitals:   10/30/21 2018 10/30/21 2324 10/31/21 0419 10/31/21 0814  BP: 138/65 (!) 136/51 (!) 154/80 (!) 149/58  Pulse: 81 83 85 89  Resp: '18 16 19 14  '$ Temp: 98.4 F (36.9 C) 98.8 F (37.1 C) 98.6 F (37 C) 98.9 F (37.2 C)  TempSrc: Oral Oral Oral Oral  SpO2: 96% 100% 98% 100%  Weight:   (!) 150.4 kg   Height:       SpO2: 100 % O2 Flow Rate (L/min): 2 L/min   Intake/Output Summary (Last 24 hours) at 10/31/2021 1121 Last data filed at 10/31/2021 0200 Gross per 24 hour  Intake 240 ml  Output 225 ml  Net 15 ml    Filed Weights   10/29/21 0556  10/30/21 0625 10/31/21 0419  Weight: (!) 147.9 kg (!) 150.1 kg (!) 150.4 kg    Exam: General exam: In no acute distress. Respiratory system: Good air movement and clear to auscultation. Cardiovascular system: S1 & S2 heard, RRR. No JVD. Gastrointestinal system: Abdomen is nondistended, soft and nontender.  Extremities: No pedal edema. Skin: No rashes, lesions or ulcers Psychiatry: Judgement and insight appear normal. Mood & affect appropriate. Data Reviewed:    Labs: Basic Metabolic Panel: Recent Labs  Lab 10/25/21 0146 10/26/21 0248 10/28/21 0754 10/28/21 1400 10/29/21 0500 10/30/21 0500 10/31/21 0640  NA 143   < > 141 141 138 134* 133*  K 4.0   < > 4.0 4.1 4.1 4.3 4.3  CL 107   < > 105 107 103 101 100  CO2 29   < > '27 26 26 25 24  '$ GLUCOSE 91   < > 227* 189* 169* 191* 205*  BUN 30*   < > 28* 28* 33* 46* 62*  CREATININE 2.13*   < > 2.15* 2.11* 2.82* 4.19* 4.92*  CALCIUM 9.1   < > 9.4 9.3 9.3 9.1 9.2  MG 1.9  --   --   --  1.8  --   --    < > = values in this interval not displayed.  GFR Estimated Creatinine Clearance: 19.3 mL/min (A) (by C-G formula based on SCr of 4.92 mg/dL (H)). Liver Function Tests: Recent Labs  Lab 10/25/21 0146 10/26/21 0248  AST 34 32  ALT 20 20  ALKPHOS 38 35*  BILITOT 0.9 0.6  PROT 6.4* 6.3*  ALBUMIN 2.8* 2.8*    No results for input(s): "LIPASE", "AMYLASE" in the last 168 hours. No results for input(s): "AMMONIA" in the last 168 hours. Coagulation profile Recent Labs  Lab 10/25/21 0146  INR 1.3*    COVID-19 Labs  No results for input(s): "DDIMER", "FERRITIN", "LDH", "CRP" in the last 72 hours.   Lab Results  Component Value Date   [place] NEGATIVE 10/06/2018    CBC: Recent Labs  Lab 10/27/21 0600 10/28/21 0754 10/29/21 0500 10/30/21 0500 10/31/21 0640  WBC 6.4 7.9 8.4 6.8 7.5  HGB 7.3* 8.2* 7.6* 7.3* 7.1*  HCT 22.1* 25.5* 22.9* 21.7* 22.3*  MCV 85.7 84.4 85.1 83.1 85.1  PLT 173 200 170 167 175     Cardiac Enzymes: No results for input(s): "CKTOTAL", "CKMB", "CKMBINDEX", "TROPONINI" in the last 168 hours. BNP (last 3 results) No results for input(s): "PROBNP" in the last 8760 hours. CBG: Recent Labs  Lab 10/30/21 0610 10/30/21 1206 10/30/21 1729 10/30/21 2115 10/31/21 0613  GLUCAP 222* 217* 197* 191* 217*    D-Dimer: No results for input(s): "DDIMER" in the last 72 hours. Hgb A1c: No results for input(s): "HGBA1C" in the last 72 hours. Lipid Profile: No results for input(s): "CHOL", "HDL", "LDLCALC", "TRIG", "CHOLHDL", "LDLDIRECT" in the last 72 hours.  Thyroid function studies: No results for input(s): "TSH", "T4TOTAL", "T3FREE", "THYROIDAB" in the last 72 hours.  Invalid input(s): "FREET3" Anemia work up: No results for input(s): "VITAMINB12", "FOLATE", "FERRITIN", "TIBC", "IRON", "RETICCTPCT" in the last 72 hours.  Sepsis Labs: Recent Labs  Lab 10/28/21 0754 10/29/21 0500 10/30/21 0500 10/31/21 0640  WBC 7.9 8.4 6.8 7.5    Microbiology No results found for this or any previous visit (from the past 240 hour(s)).   Medications:    aspirin EC  81 mg Oral Daily   carvedilol  12.5 mg Oral BID WC   Chlorhexidine Gluconate Cloth  6 each Topical Daily   citalopram  20 mg Oral BID   febuxostat  80 mg Oral q morning   fenofibrate  160 mg Oral Daily   icosapent Ethyl  2 g Oral Daily   insulin aspart  0-15 Units Subcutaneous TID WC   insulin aspart  0-5 Units Subcutaneous QHS   insulin detemir  14 Units Subcutaneous BID   isosorbide mononitrate  120 mg Oral Daily   omeprazole  20 mg Oral Daily   prasugrel  10 mg Oral Daily   ranolazine  500 mg Oral Daily   rosuvastatin  5 mg Oral Daily   senna-docusate  1 tablet Oral BID   Continuous Infusions:  furosemide 120 mg (10/31/21 1046)      LOS: 6 days   Charlynne Cousins  [facility]  10/31/2021, 11:21 AM

## 2021-11-01 DIAGNOSIS — I214 Non-ST elevation (NSTEMI) myocardial infarction: Secondary | ICD-10-CM | POA: Diagnosis not present

## 2021-11-01 DIAGNOSIS — I5033 Acute on chronic diastolic (congestive) heart failure: Secondary | ICD-10-CM | POA: Diagnosis not present

## 2021-11-01 LAB — COOXEMETRY PANEL
Carboxyhemoglobin: 1.6 % — ABNORMAL HIGH (ref 0.5–1.5)
Methemoglobin: <0.7 % (ref 0.0–1.5)
O2 Saturation: 69.3 %
Total hemoglobin: 7.6 g/dL — ABNORMAL LOW (ref 12.0–16.0)

## 2021-11-01 LAB — CBC
HCT: 22.1 % — ABNORMAL LOW (ref 36.0–46.0)
Hemoglobin: 7.4 g/dL — ABNORMAL LOW (ref 12.0–15.0)
MCH: 28.2 pg (ref 26.0–34.0)
MCHC: 33.5 g/dL (ref 30.0–36.0)
MCV: 84.4 fL (ref 80.0–100.0)
Platelets: 188 10*3/uL (ref 150–400)
RBC: 2.62 MIL/uL — ABNORMAL LOW (ref 3.87–5.11)
RDW: 17.9 % — ABNORMAL HIGH (ref 11.5–15.5)
WBC: 6.6 10*3/uL (ref 4.0–10.5)
nRBC: 0.9 % — ABNORMAL HIGH (ref 0.0–0.2)

## 2021-11-01 LAB — GLUCOSE, CAPILLARY
Glucose-Capillary: 193 mg/dL — ABNORMAL HIGH (ref 70–99)
Glucose-Capillary: 166 mg/dL — ABNORMAL HIGH (ref 70–99)
Glucose-Capillary: 171 mg/dL — ABNORMAL HIGH (ref 70–99)
Glucose-Capillary: 93 mg/dL (ref 70–99)
Glucose-Capillary: 238 mg/dL — ABNORMAL HIGH (ref 70–99)
Glucose-Capillary: 212 mg/dL — ABNORMAL HIGH (ref 70–99)

## 2021-11-01 LAB — BASIC METABOLIC PANEL
Anion gap: 8 (ref 5–15)
BUN: 73 mg/dL — ABNORMAL HIGH (ref 6–20)
CO2: 24 mmol/L (ref 22–32)
Calcium: 9 mg/dL (ref 8.9–10.3)
Chloride: 103 mmol/L (ref 98–111)
Creatinine, Ser: 5.22 mg/dL — ABNORMAL HIGH (ref 0.44–1.00)
GFR, Estimated: 9 mL/min — ABNORMAL LOW (ref >60)
Glucose, Bld: 181 mg/dL — ABNORMAL HIGH (ref 70–99)
Potassium: 3.9 mmol/L (ref 3.5–5.1)
Sodium: 135 mmol/L (ref 135–145)

## 2021-11-01 MED ORDER — INSULIN DETEMIR 100 UNIT/ML ~~LOC~~ SOLN
20.0000 [IU] | Freq: Two times a day (BID) | SUBCUTANEOUS | Status: DC
  Administered 2021-11-01: 16 [IU] via SUBCUTANEOUS
  Administered 2021-11-01 – 2021-11-02 (×2): 20 [IU] via SUBCUTANEOUS
  Administered 2021-11-02: 16 [IU] via SUBCUTANEOUS
  Administered 2021-11-03 – 2021-11-05 (×4): 20 [IU] via SUBCUTANEOUS
  Filled 2021-11-01 (×10): qty 0.2

## 2021-11-01 MED ORDER — FUROSEMIDE 10 MG/ML IJ SOLN
120.0000 mg | Freq: Two times a day (BID) | INTRAVENOUS | Status: AC
  Administered 2021-11-01 – 2021-11-02 (×2): 120 mg via INTRAVENOUS
  Filled 2021-11-01 (×2): qty 10

## 2021-11-01 MED ORDER — ALBUMIN HUMAN 25 % IV SOLN
12.5000 g | Freq: Once | INTRAVENOUS | Status: AC
Start: 2021-11-01 — End: 2021-11-01
  Administered 2021-11-01: 12.5 g via INTRAVENOUS
  Filled 2021-11-01: qty 50

## 2021-11-01 NOTE — Progress Notes (Signed)
Occupational Therapy Treatment Patient Details Name: [name] MRN: [id number] DOB: 21-Sep-1965 Today's Date: 11/01/2021   History of present illness Pt is a 56 y.o. female admitted 10/25/21 with chest pain; workup for NSTEMI, CHF. S/p LHC 8/7 showing severe 3-vessel obstructive CAD. Echo with stable LV EF 55-60%. S/p colonoscopy and EGD 8/8. Pt also c/o R foot pain; MRI consistent with skin blistering. 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   OT comments  Pt. Seen for skilled OT treatment session.  Pt. Able to demonstrate simulated 3n1 transfer min guard a.  Educated on LB dressing tech. To promote safety and energy conservation.  Agree with current d/c recommendations.     Recommendations for follow up therapy are one component of a multi-disciplinary discharge planning process, led by the attending physician.  Recommendations may be updated based on patient status, additional functional criteria and insurance authorization.    Follow Up Recommendations  Home health OT    Assistance Recommended at Discharge Intermittent Supervision/Assistance  Patient can return home with the following  Assist for transportation;Help with stairs or ramp for entrance;Assistance with cooking/housework   Equipment Recommendations  None recommended by OT    Recommendations for Other Services      Precautions / Restrictions Precautions Precautions: Fall Precaution Comments: Watch O2 sats       Mobility Bed Mobility               General bed mobility comments: sitting eob pre session    Transfers Overall transfer level: Needs assistance Equipment used: None   Sit to Stand: Min guard           General transfer comment: utilized furniture walking     Balance                                           ADL either performed or assessed with clinical judgement   ADL Overall ADL's : Needs  assistance/impaired                     Lower Body Dressing: Cueing for compensatory techniques;Cueing for sequencing;Sit to/from stand Lower Body Dressing Details (indicate cue type and reason): provided demo and options for modified ways to perform lb dressing vs. seated bending forward, pt. declining attempts to try these methods and cont. to demo how she does it at home by bending forward Toilet Transfer: Min Dispensing optician Details (indicate cue type and reason): stand pivot towards 3n1 but declined full completion of transfer and sitting on 3n1         Functional mobility during ADLs: Min guard General ADL Comments: pt. utilized furniture walking for stand pivot to 3n1 and also aound the bed to recliner    Extremity/Trunk Assessment              Vision       Perception     Praxis      Cognition Arousal/Alertness: Awake/alert   Overall Cognitive Status: Within Functional Limits for tasks assessed                                          Exercises      Shoulder Instructions  General Comments      Pertinent Vitals/ Pain       Pain Assessment Pain Assessment: No/denies pain  Home Living                                          Prior Functioning/Environment              Frequency  Min 2X/week        Progress Toward Goals  OT Goals(current goals can now be found in the care plan section)  Progress towards OT goals: Progressing toward goals     Plan Discharge plan remains appropriate    Co-evaluation                 AM-PAC OT "6 Clicks" Daily Activity     Outcome Measure   Help from another person eating meals?: None Help from another person taking care of personal grooming?: A Little Help from another person toileting, which includes using toliet, bedpan, or urinal?: A Little Help from another person bathing (including washing, rinsing, drying)?: A Lot Help from  another person to put on and taking off regular upper body clothing?: A Little Help from another person to put on and taking off regular lower body clothing?: A Lot 6 Click Score: 17    End of Session    OT Visit Diagnosis: Unsteadiness on feet (R26.81)   Activity Tolerance Patient tolerated treatment well   Patient Left in chair;with call bell/phone within reach   Nurse Communication Other (comment) (pt. concerns regarding that rns need to be aware of her R arm being a restricted ue.  also notified rn of pt. request for carbination to help her burp. pt. declining diet beverages but unable to have reg. soda secondary to elevated blood sugar)        Time: 3536-1443 OT Time Calculation (min): 39 min  Charges: OT General Charges $OT Visit: 1 Visit OT Treatments $Self Care/Home Management : 38-52 mins  Sonia Baller, COTA/L Acute Rehabilitation [phone]   Tanya Nones 11/01/2021, 9:47 AM

## 2021-11-01 NOTE — Progress Notes (Signed)
[facility] PROGRESS NOTE    Progress Note  [name]  QIH:[id number] DOB: January 21, 1966 DOA: 10/25/2021 PCP: Jacklynn Ganong, MD     Brief Narrative:   [name] is an 56 y.o. female past medical history of CAD s/p CABG in 2010, subsequently underwent angioplasty with a drug-eluting stent in 8756, chronic diastolic heart failure, chronic kidney disease stage IV, diabetes mellitus type 2, essential hypertension anemia of chronic disease paroxysmal atrial fibrillation, who presented to [facility] with complaints of 3 days of intermittent chest pain radiating to her left arm intermittent associated with nausea and shortness of breath.  Had chest pain hospital cardiac markers were elevated chest x-ray showed pulmonary edema placed on IV heparin and nitroglycerin transferred to [facility] for cardiology evaluation   Significant Events: Cardiology Heart failure team  GI   Assessment/Plan:   Acute NSTEMI (non-ST elevated myocardial infarction) [facility]) Cardiac cath on 10/27/2021 showed diffuse coronary artery disease no need for intervention. She was continue on aspirin, Effient Imdur, statins, Ranexa and Coreg. Cardiology decreased dose of Coreg and Ranexa due to elevation in her creatinine.  Acute on chronic heart failure with preserved ejection fraction (HFpEF) ([place]) Admitting x-ray showed pulmonary edema was gently diuresed but then required volume resuscitation due to renal dysfunction. Echo on this admission showed an EF of 55% PICC line was placed to monitor CVP due to her body habitus. Currently on Coreg, Imdur. Further management per advanced heart failure team, positive about half a liter.  Acute kidney injury Chronic kidney disease stage IV/Cardiorenal syndrome: With a baseline creatinine of around 2.3.  Improved with fluid resuscitation. Renal ultrasound showed no hydronephrosis. Nephrology was consulted who started her on high-dose diuretics her creatinine is  slowly creeping up.  They are concerned about cardiorenal syndrome and that she might need dialysis Further management per nephrology.  Wound of the right foot with a history of post first toe amputation: X-ray did not show any osteomyelitis. MRI of the foot showed new superficial fluid collection in the plantar medial aspect consistent with skin blistering with a deeper component extending towards the flexor longus but no evidence of tenosynovitis, no septic arthritis or osteomyelitis. Remained afebrile with no leukocytosis. Blister has spontaneously rupture will continue wound care.  Essential hypertension See heart failure for medications.  Diabetes mellitus type 2: With an A1c of 6.9 patient has been reluctant to adjust her insulin despite that her blood glucose remains poorly controlled. Blood glucose continues to be poorly controlled despite adjusting her insulin. Blood glucose still trending up we will add with meal coverage.  Anemia of chronic renal disease/anemia of chronic disease: Due to her NSTEMI was placed on IV heparin, 2022 her hemoglobin was 1, was drifting down. GI was consulted on 10/28/2020; EGD showed grade C esophagitis no bleeding, gastritis with overlying hematin biopsies were taken.  GI recommended Protonix p.o. twice daily. Colonoscopy-showed 2 polyps which were removed diverticulosis no evidence of bleeding, some internal hemorrhoids. Hemoglobin has been fluctuating around 7.3-7.6, no evidence of bleeding.  Esophagitis/gastritis: Diagnosed by EGD on 10/28/2021. Protonix p.o. twice daily. Will need repeat EGD in 12 weeks, further management per GI.  Obstructive sleep apnea: Noncompliant with BiPAP while in house.  Hyperlipidemia associated with diabetes mellitus type 2: Continue statins.   DVT prophylaxis: scd Family Communication:none Status is: Inpatient Remains inpatient appropriate because: Acute heart failure now with a GI bleed.    Code Status:      Code Status Orders  (From admission, onward)  Start     Ordered   10/25/21 0059  Full code  Continuous        10/25/21 0101           Code Status History     Date Active Date Inactive Code Status Order ID Comments User Context   10/01/2018 2219 10/17/2018 2118 Full Code [id number]  Nila Nephew, MD Inpatient         IV Access:   Peripheral IV   Procedures and diagnostic studies:   US RENAL  Result Date: 10/30/2021 CLINICAL DATA:  Acute kidney injury EXAM: RENAL / URINARY TRACT ULTRASOUND COMPLETE COMPARISON:  None Available. FINDINGS: Right Kidney: Renal measurements: 12 x 5 x 6 cm = volume: 180 mL. Echogenicity within normal limits. No mass or hydronephrosis visualized. Left Kidney: Renal measurements: 10 x 6 x 4 cm = volume: 130 mL. Echogenicity within normal limits. No mass or hydronephrosis visualized. The kidney is likely underestimated due to difficult visualization from overlapping shadowing structures. No visible cortex thinning or other cause for asymmetric renal size. Bladder: Not visualized Other: Incidental note of numerous gallstones. IMPRESSION: 1. No hydronephrosis. 2. Difficult visualization of the left kidney with size likely underestimated. No suspected renal atrophy. 3. Incidental cholelithiasis. Electronically Signed   By: Jorje Guild M.D.   On: 10/30/2021 14:48     Medical Consultants:   None.   Subjective:    [name] complaining that did not get enough sleep going to the bathroom.  Objective:    Vitals:   10/31/21 2021 10/31/21 2351 11/01/21 0421 11/01/21 0500  BP: (!) 152/56 (!) 152/87 (!) 146/70   Pulse: 93 89 80   Resp: '20 20 13   '$ Temp: 98.1 F (36.7 C) 98.6 F (37 C) 97.6 F (36.4 C)   TempSrc: Oral Oral Oral   SpO2: 98% 99% 100%   Weight:    (!) 149.8 kg  Height:       SpO2: 100 % O2 Flow Rate (L/min): 2 L/min   Intake/Output Summary (Last 24 hours) at 11/01/2021 0721 Last data filed at  11/01/2021 0711 Gross per 24 hour  Intake 480 ml  Output 3050 ml  Net -2570 ml    Filed Weights   10/30/21 0625 10/31/21 0419 11/01/21 0500  Weight: (!) 150.1 kg (!) 150.4 kg (!) 149.8 kg    Exam: General exam: In no acute distress. Respiratory system: Good air movement and clear to auscultation. Cardiovascular system: S1 & S2 heard, RRR. No JVD. Gastrointestinal system: Abdomen is nondistended, soft and nontender.  Extremities: 3+ edema Skin: No rashes, lesions or ulcers Psychiatry: Judgement and insight appear normal. Mood & affect appropriate. Data Reviewed:    Labs: Basic Metabolic Panel: Recent Labs  Lab 10/28/21 1400 10/29/21 0500 10/30/21 0500 10/31/21 0640 11/01/21 0459  NA 141 138 134* 133* 135  K 4.1 4.1 4.3 4.3 3.9  CL 107 103 101 100 103  CO2 '26 26 25 24 24  '$ GLUCOSE 189* 169* 191* 205* 181*  BUN 28* 33* 46* 62* 73*  CREATININE 2.11* 2.82* 4.19* 4.92* 5.22*  CALCIUM 9.3 9.3 9.1 9.2 9.0  MG  --  1.8  --   --   --     GFR Estimated Creatinine Clearance: 18.2 mL/min (A) (by C-G formula based on SCr of 5.22 mg/dL (H)). Liver Function Tests: Recent Labs  Lab 10/26/21 0248  AST 32  ALT 20  ALKPHOS 35*  BILITOT 0.6  PROT 6.3*  ALBUMIN 2.8*  No results for input(s): "LIPASE", "AMYLASE" in the last 168 hours. No results for input(s): "AMMONIA" in the last 168 hours. Coagulation profile No results for input(s): "INR", "PROTIME" in the last 168 hours.  COVID-19 Labs  No results for input(s): "DDIMER", "FERRITIN", "LDH", "CRP" in the last 72 hours.   Lab Results  Component Value Date   [place] NEGATIVE 10/06/2018    CBC: Recent Labs  Lab 10/28/21 0754 10/29/21 0500 10/30/21 0500 10/31/21 0640 11/01/21 0459  WBC 7.9 8.4 6.8 7.5 6.6  HGB 8.2* 7.6* 7.3* 7.1* 7.4*  HCT 25.5* 22.9* 21.7* 22.3* 22.1*  MCV 84.4 85.1 83.1 85.1 84.4  PLT 200 170 167 175 188    Cardiac Enzymes: No results for input(s): "CKTOTAL", "CKMB", "CKMBINDEX",  "TROPONINI" in the last 168 hours. BNP (last 3 results) No results for input(s): "PROBNP" in the last 8760 hours. CBG: Recent Labs  Lab 10/31/21 0613 10/31/21 1254 10/31/21 1743 10/31/21 2113 11/01/21 0631  GLUCAP 217* 207* 204* 206* 193*    D-Dimer: No results for input(s): "DDIMER" in the last 72 hours. Hgb A1c: No results for input(s): "HGBA1C" in the last 72 hours. Lipid Profile: No results for input(s): "CHOL", "HDL", "LDLCALC", "TRIG", "CHOLHDL", "LDLDIRECT" in the last 72 hours.  Thyroid function studies: No results for input(s): "TSH", "T4TOTAL", "T3FREE", "THYROIDAB" in the last 72 hours.  Invalid input(s): "FREET3" Anemia work up: No results for input(s): "VITAMINB12", "FOLATE", "FERRITIN", "TIBC", "IRON", "RETICCTPCT" in the last 72 hours.  Sepsis Labs: Recent Labs  Lab 10/29/21 0500 10/30/21 0500 10/31/21 0640 11/01/21 0459  WBC 8.4 6.8 7.5 6.6    Microbiology No results found for this or any previous visit (from the past 240 hour(s)).   Medications:    aspirin EC  81 mg Oral Daily   carvedilol  12.5 mg Oral BID WC   Chlorhexidine Gluconate Cloth  6 each Topical Daily   citalopram  20 mg Oral BID   febuxostat  80 mg Oral q morning   fenofibrate  160 mg Oral Daily   icosapent Ethyl  2 g Oral Daily   insulin aspart  0-15 Units Subcutaneous TID WC   insulin aspart  0-5 Units Subcutaneous QHS   insulin aspart  3 Units Subcutaneous TID WC   insulin detemir  16 Units Subcutaneous BID   isosorbide mononitrate  120 mg Oral Daily   omeprazole  20 mg Oral Daily   prasugrel  10 mg Oral Daily   ranolazine  500 mg Oral Daily   rosuvastatin  5 mg Oral Daily   senna-docusate  1 tablet Oral BID   Continuous Infusions:      LOS: 7 days   Charlynne Cousins  [facility]  11/01/2021, 7:21 AM

## 2021-11-01 NOTE — Progress Notes (Signed)
Patient ID: [name], female   DOB: 06/24/1965, 56 y.o.   MRN: [id number] S: Feeling better today O:BP (!) 115/52 (BP Location: Left Arm)   Pulse 76   Temp 97.9 F (36.6 C) (Oral)   Resp 18   Ht '5\' 7"'$  (1.702 m)   Wt (!) 149.8 kg   SpO2 98%   BMI 51.72 kg/m   Intake/Output Summary (Last 24 hours) at 11/01/2021 1043 Last data filed at 11/01/2021 0711 Gross per 24 hour  Intake 240 ml  Output 2800 ml  Net -2560 ml   Intake/Output: I/O last 3 completed shifts: In: 720 [P.O.:720] Out: 2250 [Urine:2250]  Intake/Output this shift:  Total I/O In: -  Out: 800 [Urine:800] Weight change: -0.6 kg Gen:NAD CVS: RRR Resp:CTA Abd: obese, +BS, soft, NT/ND Ext: 2+ edema BLE with chronic venous stasis changes  Recent Labs  Lab 10/26/21 0248 10/26/21 2247 10/27/21 0600 10/28/21 0754 10/28/21 1400 10/29/21 0500 10/30/21 0500 10/31/21 0640 11/01/21 0459  NA 137   < > 140 141 141 138 134* 133* 135  K 4.6   < > 3.6 4.0 4.1 4.1 4.3 4.3 3.9  CL 101   < > 110 105 107 103 101 100 103  CO2 30   < > '26 27 26 26 25 24 24  '$ GLUCOSE 236*   < > 170* 227* 189* 169* 191* 205* 181*  BUN 34*   < > 28* 28* 28* 33* 46* 62* 73*  CREATININE 2.71*   < > 1.93* 2.15* 2.11* 2.82* 4.19* 4.92* 5.22*  ALBUMIN 2.8*  --   --   --   --   --   --   --   --   CALCIUM 9.0   < > 8.0* 9.4 9.3 9.3 9.1 9.2 9.0  AST 32  --   --   --   --   --   --   --   --   ALT 20  --   --   --   --   --   --   --   --    < > = values in this interval not displayed.   Liver Function Tests: Recent Labs  Lab 10/26/21 0248  AST 32  ALT 20  ALKPHOS 35*  BILITOT 0.6  PROT 6.3*  ALBUMIN 2.8*   No results for input(s): "LIPASE", "AMYLASE" in the last 168 hours. No results for input(s): "AMMONIA" in the last 168 hours. CBC: Recent Labs  Lab 10/28/21 0754 10/29/21 0500 10/30/21 0500 10/31/21 0640 11/01/21 0459  WBC 7.9 8.4 6.8 7.5 6.6  HGB 8.2* 7.6* 7.3* 7.1* 7.4*  HCT 25.5* 22.9* 21.7* 22.3* 22.1*  MCV 84.4 85.1 83.1  85.1 84.4  PLT 200 170 167 175 188   Cardiac Enzymes: No results for input(s): "CKTOTAL", "CKMB", "CKMBINDEX", "TROPONINI" in the last 168 hours. CBG: Recent Labs  Lab 10/31/21 1254 10/31/21 1743 10/31/21 2113 11/01/21 0631 11/01/21 0808  GLUCAP 207* 204* 206* 193* 166*    Iron Studies: No results for input(s): "IRON", "TIBC", "TRANSFERRIN", "FERRITIN" in the last 72 hours. Studies/Results: US RENAL  Result Date: 10/30/2021 CLINICAL DATA:  Acute kidney injury EXAM: RENAL / URINARY TRACT ULTRASOUND COMPLETE COMPARISON:  None Available. FINDINGS: Right Kidney: Renal measurements: 12 x 5 x 6 cm = volume: 180 mL. Echogenicity within normal limits. No mass or hydronephrosis visualized. Left Kidney: Renal measurements: 10 x 6 x 4 cm = volume: 130 mL. Echogenicity within normal limits. No mass  or hydronephrosis visualized. The kidney is likely underestimated due to difficult visualization from overlapping shadowing structures. No visible cortex thinning or other cause for asymmetric renal size. Bladder: Not visualized Other: Incidental note of numerous gallstones. IMPRESSION: 1. No hydronephrosis. 2. Difficult visualization of the left kidney with size likely underestimated. No suspected renal atrophy. 3. Incidental cholelithiasis. Electronically Signed   By: Jorje Guild M.D.   On: 10/30/2021 14:48    aspirin EC  81 mg Oral Daily   carvedilol  12.5 mg Oral BID WC   Chlorhexidine Gluconate Cloth  6 each Topical Daily   citalopram  20 mg Oral BID   febuxostat  80 mg Oral q morning   fenofibrate  160 mg Oral Daily   icosapent Ethyl  2 g Oral Daily   insulin aspart  0-15 Units Subcutaneous TID WC   insulin aspart  0-5 Units Subcutaneous QHS   insulin aspart  3 Units Subcutaneous TID WC   insulin detemir  20 Units Subcutaneous BID   isosorbide mononitrate  120 mg Oral Daily   omeprazole  20 mg Oral Daily   prasugrel  10 mg Oral Daily   ranolazine  500 mg Oral Daily   rosuvastatin  5 mg  Oral Daily   senna-docusate  1 tablet Oral BID    BMET    Component Value Date/Time   NA 135 11/01/2021 0459   K 3.9 11/01/2021 0459   CL 103 11/01/2021 0459   CO2 24 11/01/2021 0459   GLUCOSE 181 (H) 11/01/2021 0459   BUN 73 (H) 11/01/2021 0459   CREATININE 5.22 (H) 11/01/2021 0459   CALCIUM 9.0 11/01/2021 0459   GFRNONAA 9 (L) 11/01/2021 0459   GFRAA 33 (L) 06/15/2019 1237   CBC    Component Value Date/Time   WBC 6.6 11/01/2021 0459   RBC 2.62 (L) 11/01/2021 0459   HGB 7.4 (L) 11/01/2021 0459   HCT 22.1 (L) 11/01/2021 0459   PLT 188 11/01/2021 0459   MCV 84.4 11/01/2021 0459   MCH 28.2 11/01/2021 0459   MCHC 33.5 11/01/2021 0459   RDW 17.9 (H) 11/01/2021 0459   LYMPHSABS 2.4 02/24/2010 1320   MONOABS 0.6 02/24/2010 1320   EOSABS 0.1 02/24/2010 1320   BASOSABS 0.0 02/24/2010 1320    Assessment/Plan:  AKI/CKD stage IV - likely combination of contrast-induced nephropathy as well as acute on chronic diastolic CHF and cardiorenal syndrome.  UOP has dropped off over the last 24 hours since holding diuretics.  She will need to resume IV lasix and follow her response.  Urine studies consistent with pre-renal (FeNa was 0.59%) and renal US unremarkable.  This is most consistent with cardiorenal syndrome and will need to restart IV furosemide and follow UOP and renal function.  I did discuss with her the severity and chronicity of her kidney disease, however she simply states "It will be fine" and doesn't want to discuss the likelihood of eventual need for dialysis.  UOP has increased significantly and rate of rise in BUN/Cr has slowed.  Continue with current dose of diuretics and follow.  No uremic symptoms at this time. Received 120 mg IV lasix bid yesterday and will repeat today.  Will add albumin prior to next dose. Avoid nephrotoxic medications including NSAIDs and iodinated intravenous contrast exposure unless the latter is absolutely indicated.   Preferred narcotic agents for  pain control are hydromorphone, fentanyl, and methadone. Morphine should not be used.  Avoid Baclofen and avoid oral sodium phosphate and magnesium  citrate based laxatives / bowel preps.  Continue strict Input and Output monitoring.  Will monitor the patient closely with you and intervene or adjust therapy as indicated by changes in clinical status/labs  NSTEMI - s/p cath without acute change of underlying CAD anatomy. Acute on chronic diastolic CHF - as above.  Restarted IV lasix and follow. Anemia of CKD stage IV - transfuse for Hgb <7.  ESA on hold due to CHF exacerbation. Esophagitis/gastritis - on protonix. DM type 2 - per primary OSA on CPAP Morbid obesity  Donetta Potts, MD [facility]

## 2021-11-01 NOTE — Progress Notes (Addendum)
Advanced Heart Failure Rounding Note  PCP-Cardiologist: None   Subjective:    Fe studies -c/w IDA. B12 and folate normal.   Echo w/ stable LV EF 55-60% with moderate LVH, RV normal.  LHC 8/7 w/ LVEDP 33, stable diffuse coronary disease with SVG-LAD still patent.   Creatinine 2.1>2.8>4.19>4.9> 5.22  Now on high-dose lasix. Had ~2.2L urine out. Weight down 1.5 pounds.   Says she feels fine. Denies CP, SOB, orthopnea or PND  Objective:   Weight Range: (!) 149.8 kg Body mass index is 51.72 kg/m.   Vital Signs:   Temp:  [97.6 F (36.4 C)-98.7 F (37.1 C)] 98.5 F (36.9 C) (08/12 1638) Pulse Rate:  [74-93] 79 (08/12 1638) Resp:  [13-20] 17 (08/12 1638) BP: (115-152)/(27-88) 124/64 (08/12 1638) SpO2:  [98 %-100 %] 100 % (08/12 1638) Weight:  [149.8 kg] 149.8 kg (08/12 0500) Last BM Date : 10/30/21  Weight change: Filed Weights   10/30/21 0625 10/31/21 0419 11/01/21 0500  Weight: (!) 150.1 kg (!) 150.4 kg (!) 149.8 kg    Intake/Output:   Intake/Output Summary (Last 24 hours) at 11/01/2021 1710 Last data filed at 11/01/2021 1500 Gross per 24 hour  Intake 290 ml  Output 2400 ml  Net -2110 ml       Physical Exam   General:  Sitting up No resp difficulty HEENT: normal Neck: supple. JVP to jaw Carotids 2+ bilat; no bruits. No lymphadenopathy or thryomegaly appreciated. Cor: PMI nondisplaced. Regular rate & rhythm. No rubs, gallops or murmurs. Lungs: clear Abdomen: obese soft, nontender, nondistended. No hepatosplenomegaly. No bruits or masses. Good bowel sounds. Extremities: no cyanosis, clubbing, rash,1-2+ edema Neuro: alert & orientedx3, cranial nerves grossly intact. moves all 4 extremities w/o difficulty. Affect pleasant   Telemetry   SR 80s Personally reviewed  Labs    CBC Recent Labs    10/31/21 0640 11/01/21 0459  WBC 7.5 6.6  HGB 7.1* 7.4*  HCT 22.3* 22.1*  MCV 85.1 84.4  PLT 175 941    Basic Metabolic Panel Recent Labs     10/31/21 0640 11/01/21 0459  NA 133* 135  K 4.3 3.9  CL 100 103  CO2 24 24  GLUCOSE 205* 181*  BUN 62* 73*  CREATININE 4.92* 5.22*  CALCIUM 9.2 9.0    Liver Function Tests No results for input(s): "AST", "ALT", "ALKPHOS", "BILITOT", "PROT", "ALBUMIN" in the last 72 hours.  No results for input(s): "LIPASE", "AMYLASE" in the last 72 hours. Cardiac Enzymes No results for input(s): "CKTOTAL", "CKMB", "CKMBINDEX", "TROPONINI" in the last 72 hours.  BNP: BNP (last 3 results) No results for input(s): "BNP" in the last 8760 hours.  ProBNP (last 3 results) No results for input(s): "PROBNP" in the last 8760 hours.   D-Dimer No results for input(s): "DDIMER" in the last 72 hours. Hemoglobin A1C No results for input(s): "HGBA1C" in the last 72 hours. Fasting Lipid Panel No results for input(s): "CHOL", "HDL", "LDLCALC", "TRIG", "CHOLHDL", "LDLDIRECT" in the last 72 hours.  Thyroid Function Tests No results for input(s): "TSH", "T4TOTAL", "T3FREE", "THYROIDAB" in the last 72 hours.  Invalid input(s): "FREET3"  Other results:   Imaging    No results found.   Medications:     Scheduled Medications:  aspirin EC  81 mg Oral Daily   carvedilol  12.5 mg Oral BID WC   Chlorhexidine Gluconate Cloth  6 each Topical Daily   citalopram  20 mg Oral BID   febuxostat  80 mg Oral q  morning   fenofibrate  160 mg Oral Daily   icosapent Ethyl  2 g Oral Daily   insulin aspart  0-15 Units Subcutaneous TID WC   insulin aspart  0-5 Units Subcutaneous QHS   insulin aspart  3 Units Subcutaneous TID WC   insulin detemir  20 Units Subcutaneous BID   isosorbide mononitrate  120 mg Oral Daily   omeprazole  20 mg Oral Daily   prasugrel  10 mg Oral Daily   ranolazine  500 mg Oral Daily   rosuvastatin  5 mg Oral Daily   senna-docusate  1 tablet Oral BID    Infusions:  furosemide 120 mg (11/01/21 1607)     PRN Medications: acetaminophen, ondansetron **OR** ondansetron (ZOFRAN) IV,  oxyCODONE, polyethylene glycol, traMADol, zolpidem    Patient Profile   56 y.o. female with history of CAD s/p CABG followed by mulitple PCIs, chronic diastolic CHF, HLD, obesity, venous insufficiency, recurrent LE wounds, CKD III, HTN, OSA, anemia.    Admitted with NSTEMI, AKI on CKD, acute on chronic anemia and notable RLE wound.  Assessment/Plan   1. CAD:  Very aggressive coronary disease.  Had 4 more drug-eluting stents placed in 12/11, 1 in the SVG-LAD and 3 to open the totally occluded native RCA after occlusion of the SVG-PDA. She was deemed not to be a candidate for re-do CABG at that admission by cardiac surgery. Unfortunately, due to her body habitus, the surgeons were unable to mobilize her LIMA for grafting during her initial CABG operation. Lexiscan Cardiolite in 3/14 showed no ischemia or infarction though study was limited by body habitus.  NSTEMI in 7/20, LHC showed occluded SVG-RCA and native RCA, occluded mLAD, 80% in-stent restenosis in the SVG-LAD, 95% mLCx stenosis.  DES to SVG-LAD, LCx thought to be too small to intervene upon.  NSTEMI 11/21, occluded SVG-LAD treated with PTCA.  NSTEMI this admission with HS-TnI 1711 => 1943.  Cath this admit with chronic occlusion of RCA and SVG-RCA, 70% ostial LAD, occluded mid LAD, patent SVG-LAD, patent SVG-D, stable 80% mLCx and 70% OM1 stenoses (diffuse disease in LCx system).  No target for intervention. Possible rise in TnI due to demand ischemia with anemia and volume overload in the setting of chronic coronary diffuse coronary disease.  - No angina currently - Has been on Effient long-term since she is a poor Plavix responder and has multiple DES.   - Continue ASA 81. - Continue Crestor.    - Continue Coreg, Imdur, and ranolazine. Continue lower dose of coreg and Ranex  in setting of elevated creatinine.   May need to stop Ranexa soon. Will d/w PharmD  2. Acute on chronic diastolic CHF: NYHA class III symptoms.  Torsemide recently  decreased from 60 bid to 20 bid, unsure why.  This may have triggered volume overload/CHF.  Echo this admission with EF 55-60%, moderate LVH, normal RV.  LVEDP 33 on LHC.  Fluid overloaded on exam. Continue lasix per Renal - Worsening renal function. Nephrology following. - Wound not use SGLT2-inhibitor candidate given body habitus (risk of UTIs/yeast infections).  3. Hyperlipidemia: She is on Vascepa, Crestor, and fenofibrate. Check lipid panel today  4. Obesity: Ideally would start semaglutide or dulaglutide.  5.Venous insufficiency:  ABIs normal last check. Venous stasis ulcer right foot followed by wound care.  MRI this admission did not show osteomyelitis.  - ABIs: noncompressible R/L 6. AKI on CKD: Stage 3/Possible contrast nephropathy.  Creatinine appears to run 2.1 - 2.2 baseline.  Was  down to 1.9 but now 5.2  did have contrast with LHC.  -Renal US- no hydronephrosis.  - Creatinine continues to rise 2.8 >4.19 >4.9 > 5.2 - Nephrology managing lasix - Still making good urine. Weight down. Hopeful for renal recovery. Avoid nephrotoxins 7. OSA: Needs CPAP.  8. Anemia: Hgb 7.4 today .  - Fe studies c/w IDA. B12 and folate normal.  - Treated w/ feraheme  - FOBT pending  - GI consulted. S/P EGD/ colonoscopy--esophagitis.  - PPI  We will see her again Monday. Please call over weekend if needed.   Length of Stay: [place], MD  11/01/2021, 5:10 PM  Advanced Heart Failure Team Pager [phone] (M-F; 7a - 5p)  Please contact [facility] for night-coverage after hours (5p -7a ) and weekends on [email or web address]

## 2021-11-02 DIAGNOSIS — I214 Non-ST elevation (NSTEMI) myocardial infarction: Secondary | ICD-10-CM | POA: Diagnosis not present

## 2021-11-02 LAB — RENAL FUNCTION PANEL
Albumin: 3 g/dL — ABNORMAL LOW (ref 3.5–5.0)
Anion gap: 12 (ref 5–15)
BUN: 75 mg/dL — ABNORMAL HIGH (ref 6–20)
CO2: 25 mmol/L (ref 22–32)
Calcium: 9.3 mg/dL (ref 8.9–10.3)
Chloride: 103 mmol/L (ref 98–111)
Creatinine, Ser: 4.82 mg/dL — ABNORMAL HIGH (ref 0.44–1.00)
GFR, Estimated: 10 mL/min — ABNORMAL LOW (ref >60)
Glucose, Bld: 187 mg/dL — ABNORMAL HIGH (ref 70–99)
Phosphorus: 5.1 mg/dL — ABNORMAL HIGH (ref 2.5–4.6)
Potassium: 4 mmol/L (ref 3.5–5.1)
Sodium: 140 mmol/L (ref 135–145)

## 2021-11-02 LAB — GLUCOSE, CAPILLARY
Glucose-Capillary: 188 mg/dL — ABNORMAL HIGH (ref 70–99)
Glucose-Capillary: 132 mg/dL — ABNORMAL HIGH (ref 70–99)
Glucose-Capillary: 126 mg/dL — ABNORMAL HIGH (ref 70–99)
Glucose-Capillary: 154 mg/dL — ABNORMAL HIGH (ref 70–99)
Glucose-Capillary: 150 mg/dL — ABNORMAL HIGH (ref 70–99)

## 2021-11-02 LAB — CBC
HCT: 23.2 % — ABNORMAL LOW (ref 36.0–46.0)
Hemoglobin: 7.8 g/dL — ABNORMAL LOW (ref 12.0–15.0)
MCH: 28.5 pg (ref 26.0–34.0)
MCHC: 33.6 g/dL (ref 30.0–36.0)
MCV: 84.7 fL (ref 80.0–100.0)
Platelets: 214 10*3/uL (ref 150–400)
RBC: 2.74 MIL/uL — ABNORMAL LOW (ref 3.87–5.11)
RDW: 18.6 % — ABNORMAL HIGH (ref 11.5–15.5)
WBC: 7.7 10*3/uL (ref 4.0–10.5)
nRBC: 0.8 % — ABNORMAL HIGH (ref 0.0–0.2)

## 2021-11-02 LAB — COOXEMETRY PANEL
Carboxyhemoglobin: 1.2 % (ref 0.5–1.5)
Methemoglobin: 2.5 % — ABNORMAL HIGH (ref 0.0–1.5)
O2 Saturation: 52.2 %
Total hemoglobin: 8.4 g/dL — ABNORMAL LOW (ref 12.0–16.0)

## 2021-11-02 NOTE — Progress Notes (Signed)
Mobility Specialist Progress Note    11/02/21 1431  Mobility  Activity Refused mobility   Pt c/o fatigue from being up to pee so much yesterday. Will f/u as schedule permits.   Hildred Alamin Mobility Specialist

## 2021-11-02 NOTE — Progress Notes (Signed)
[facility] PROGRESS NOTE    Progress Note  [name]  WIO:[id number] DOB: November 07, 1965 DOA: 10/25/2021 PCP: Jacklynn Ganong, MD     Brief Narrative:   [name] is an 56 y.o. female past medical history of CAD s/p CABG in 2010, subsequently underwent angioplasty with a drug-eluting stent in 3845, chronic diastolic heart failure, chronic kidney disease stage IV, diabetes mellitus type 2, essential hypertension anemia of chronic disease paroxysmal atrial fibrillation, who presented to [facility] with complaints of 3 days of intermittent chest pain radiating to her left arm intermittent associated with nausea and shortness of breath.  Had chest pain hospital cardiac markers were elevated chest x-ray showed pulmonary edema placed on IV heparin and nitroglycerin transferred to [facility] for cardiology evaluation   Significant Events: Cardiology Heart failure team [facility]   Assessment/Plan:   Acute NSTEMI (non-ST elevated myocardial infarction) [facility]) Cardiac cath on 10/27/2021 showed diffuse coronary artery disease no need for intervention. She was continue on aspirin, Effient (for responded to Plavix) Imdur, statins, Ranexa and low-dose Coreg. Cardiology decreased dose of Coreg and Ranexa due to elevation in her creatinine.  Acute on chronic heart failure with preserved ejection fraction (HFpEF) ([place]) Admitting x-ray showed pulmonary edema was gently diuresed but then required volume resuscitation due to renal dysfunction. Echo on this admission showed an EF of 55% PICC line was placed to monitor CVP due to her body habitus. Currently on Coreg, Imdur. Further management per advanced heart failure team, positive about half a liter.  Acute kidney injury Chronic kidney disease stage IV/Cardiorenal syndrome: With a baseline creatinine of around 2.3.  Renal ultrasound showed no hydronephrosis. Now on high-dose Lasix with good urine output not uremic.  Renal function has  plateaued hopefully it will improve. Further management per nephrology.  Wound of the right foot with a history of post first toe amputation: X-ray did not show any osteomyelitis. MRI of the foot showed new superficial fluid collection in the plantar medial aspect consistent with skin blistering with a deeper component extending towards the flexor longus but no evidence of tenosynovitis, no septic arthritis or osteomyelitis. Remained afebrile with no leukocytosis. Blister has spontaneously rupture will continue wound care.  Essential hypertension See heart failure for medications.  Diabetes mellitus type 2: With an A1c of 6.9 patient has been reluctant to adjust her insulin despite that her blood glucose remains poorly controlled. Her insulin was increased, but patient is refusing the amount of units she has received.  Yesterday she only had 16 units. I did have a discussion with her about her insulin regimen.  Anemia of chronic renal disease/anemia of chronic disease: Due to her NSTEMI was placed on IV heparin,  was drifting down. GI was consulted on 10/28/2020; EGD showed grade C esophagitis no bleeding, gastritis with overlying hematin biopsies were taken.  GI recommended Protonix p.o. twice daily. Colonoscopy-showed 2 polyps which were removed diverticulosis no evidence of bleeding, some internal hemorrhoids. Treated with IV Feraheme Hemoglobin has been fluctuating around 7.3-7.6, no evidence of bleeding.  Esophagitis/gastritis: Diagnosed by EGD on 10/28/2021. Protonix p.o. twice daily. Will need repeat EGD in 12 weeks, further management per GI.  Obstructive sleep apnea: Noncompliant with BiPAP while in house.  Hyperlipidemia associated with diabetes mellitus type 2: Continue statins.   DVT prophylaxis: scd Family Communication:none Status is: Inpatient Remains inpatient appropriate because: Acute heart failure now with a GI bleed.    Code Status:     Code Status Orders   (From  admission, onward)           Start     Ordered   10/25/21 0059  Full code  Continuous        10/25/21 0101           Code Status History     Date Active Date Inactive Code Status Order ID Comments User Context   10/01/2018 2219 10/17/2018 2118 Full Code [id number]  Nila Nephew, MD Inpatient         IV Access:   Peripheral IV   Procedures and diagnostic studies:   No results found.   Medical Consultants:   None.   Subjective:    [name] no complaints today.  Objective:    Vitals:   11/01/21 1638 11/01/21 2106 11/01/21 2300 11/02/21 0813  BP: 124/64 (!) 146/121 (!) (P) 144/98 (!) 150/73  Pulse: 79 84 (P) 88 80  Resp: 17 17 (P) 18 18  Temp: 98.5 F (36.9 C) 98 F (36.7 C) (P) 98.2 F (36.8 C) 97.9 F (36.6 C)  TempSrc: Oral Oral (P) Oral Oral  SpO2: 100% 100%  99%  Weight:      Height:       SpO2: 99 % O2 Flow Rate (L/min): 2 L/min   Intake/Output Summary (Last 24 hours) at 11/02/2021 0911 Last data filed at 11/02/2021 0008 Gross per 24 hour  Intake 832 ml  Output 900 ml  Net -68 ml    Filed Weights   10/30/21 0625 10/31/21 0419 11/01/21 0500  Weight: (!) 150.1 kg (!) 150.4 kg (!) 149.8 kg    Exam: General exam: In no acute distress. Respiratory system: Good air movement and clear to auscultation. Cardiovascular system: S1 & S2 heard, RRR. No JVD. Gastrointestinal system: Abdomen is nondistended, soft and nontender.  Extremities: No pedal edema. Skin: No rashes, lesions or ulcers Psychiatry: Judgement and insight appear normal. Mood & affect appropriate. Data Reviewed:    Labs: Basic Metabolic Panel: Recent Labs  Lab 10/29/21 0500 10/30/21 0500 10/31/21 0640 11/01/21 0459 11/02/21 0500  NA 138 134* 133* 135 140  K 4.1 4.3 4.3 3.9 4.0  CL 103 101 100 103 103  CO2 '26 25 24 24 25  '$ GLUCOSE 169* 191* 205* 181* 187*  BUN 33* 46* 62* 73* 75*  CREATININE 2.82* 4.19* 4.92* 5.22* 4.82*  CALCIUM 9.3 9.1 9.2  9.0 9.3  MG 1.8  --   --   --   --   PHOS  --   --   --   --  5.1*    GFR Estimated Creatinine Clearance: 19.7 mL/min (A) (by C-G formula based on SCr of 4.82 mg/dL (H)). Liver Function Tests: Recent Labs  Lab 11/02/21 0500  ALBUMIN 3.0*    No results for input(s): "LIPASE", "AMYLASE" in the last 168 hours. No results for input(s): "AMMONIA" in the last 168 hours. Coagulation profile No results for input(s): "INR", "PROTIME" in the last 168 hours.  COVID-19 Labs  No results for input(s): "DDIMER", "FERRITIN", "LDH", "CRP" in the last 72 hours.   Lab Results  Component Value Date   [place] NEGATIVE 10/06/2018    CBC: Recent Labs  Lab 10/29/21 0500 10/30/21 0500 10/31/21 0640 11/01/21 0459 11/02/21 0500  WBC 8.4 6.8 7.5 6.6 7.7  HGB 7.6* 7.3* 7.1* 7.4* 7.8*  HCT 22.9* 21.7* 22.3* 22.1* 23.2*  MCV 85.1 83.1 85.1 84.4 84.7  PLT 170 167 175 188 214    Cardiac Enzymes: No results for input(s): "  CKTOTAL", "CKMB", "CKMBINDEX", "TROPONINI" in the last 168 hours. BNP (last 3 results) No results for input(s): "PROBNP" in the last 8760 hours. CBG: Recent Labs  Lab 11/01/21 1155 11/01/21 1642 11/01/21 2110 11/01/21 2322 11/02/21 0636  GLUCAP 171* 93 238* 212* 188*    D-Dimer: No results for input(s): "DDIMER" in the last 72 hours. Hgb A1c: No results for input(s): "HGBA1C" in the last 72 hours. Lipid Profile: No results for input(s): "CHOL", "HDL", "LDLCALC", "TRIG", "CHOLHDL", "LDLDIRECT" in the last 72 hours.  Thyroid function studies: No results for input(s): "TSH", "T4TOTAL", "T3FREE", "THYROIDAB" in the last 72 hours.  Invalid input(s): "FREET3" Anemia work up: No results for input(s): "VITAMINB12", "FOLATE", "FERRITIN", "TIBC", "IRON", "RETICCTPCT" in the last 72 hours.  Sepsis Labs: Recent Labs  Lab 10/30/21 0500 10/31/21 0640 11/01/21 0459 11/02/21 0500  WBC 6.8 7.5 6.6 7.7    Microbiology No results found for this or any previous  visit (from the past 240 hour(s)).   Medications:    aspirin EC  81 mg Oral Daily   carvedilol  12.5 mg Oral BID WC   Chlorhexidine Gluconate Cloth  6 each Topical Daily   citalopram  20 mg Oral BID   febuxostat  80 mg Oral q morning   fenofibrate  160 mg Oral Daily   icosapent Ethyl  2 g Oral Daily   insulin aspart  0-15 Units Subcutaneous TID WC   insulin aspart  0-5 Units Subcutaneous QHS   insulin aspart  3 Units Subcutaneous TID WC   insulin detemir  20 Units Subcutaneous BID   isosorbide mononitrate  120 mg Oral Daily   omeprazole  20 mg Oral Daily   prasugrel  10 mg Oral Daily   ranolazine  500 mg Oral Daily   rosuvastatin  5 mg Oral Daily   senna-docusate  1 tablet Oral BID   Continuous Infusions:      LOS: 8 days   Charlynne Cousins  [facility]  11/02/2021, 9:11 AM

## 2021-11-02 NOTE — Progress Notes (Signed)
Patient ID: [name], female   DOB: [birth year], 56 y.o.   MRN: [id number] S: No new complaints.  Good UOP and Scr improved overnight. O:BP 116/67   Pulse 81   Temp 97.9 F (36.6 C) (Oral)   Resp 17   Ht '5\' 7"'$  (1.702 m)   Wt (!) 149.8 kg   SpO2 99%   BMI 51.72 kg/m   Intake/Output Summary (Last 24 hours) at 11/02/2021 1031 Last data filed at 11/02/2021 0008 Gross per 24 hour  Intake 832 ml  Output 900 ml  Net -68 ml   Intake/Output: I/O last 3 completed shifts: In: 1072 [P.O.:960; IV Piggyback:112] Out: 2800 [Urine:2800]  Intake/Output this shift:  No intake/output data recorded. Weight change:  NWG:NFAOZ, NAD CVS: RRR Resp:CTA Abd: +BS, soft, NT/ND Ext: 2+ brawny edema BLE with chronic venous stasis changes.   Recent Labs  Lab 10/28/21 0754 10/28/21 1400 10/29/21 0500 10/30/21 0500 10/31/21 0640 11/01/21 0459 11/02/21 0500  NA 141 141 138 134* 133* 135 140  K 4.0 4.1 4.1 4.3 4.3 3.9 4.0  CL 105 107 103 101 100 103 103  CO2 '27 26 26 25 24 24 25  '$ GLUCOSE 227* 189* 169* 191* 205* 181* 187*  BUN 28* 28* 33* 46* 62* 73* 75*  CREATININE 2.15* 2.11* 2.82* 4.19* 4.92* 5.22* 4.82*  ALBUMIN  --   --   --   --   --   --  3.0*  CALCIUM 9.4 9.3 9.3 9.1 9.2 9.0 9.3  PHOS  --   --   --   --   --   --  5.1*   Liver Function Tests: Recent Labs  Lab 11/02/21 0500  ALBUMIN 3.0*   No results for input(s): "LIPASE", "AMYLASE" in the last 168 hours. No results for input(s): "AMMONIA" in the last 168 hours. CBC: Recent Labs  Lab 10/29/21 0500 10/30/21 0500 10/31/21 0640 11/01/21 0459 11/02/21 0500  WBC 8.4 6.8 7.5 6.6 7.7  HGB 7.6* 7.3* 7.1* 7.4* 7.8*  HCT 22.9* 21.7* 22.3* 22.1* 23.2*  MCV 85.1 83.1 85.1 84.4 84.7  PLT 170 167 175 188 214   Cardiac Enzymes: No results for input(s): "CKTOTAL", "CKMB", "CKMBINDEX", "TROPONINI" in the last 168 hours. CBG: Recent Labs  Lab 11/01/21 1642 11/01/21 2110 11/01/21 2322 11/02/21 0636 11/02/21 0933  GLUCAP 93 238*  212* 188* 132*    Iron Studies: No results for input(s): "IRON", "TIBC", "TRANSFERRIN", "FERRITIN" in the last 72 hours. Studies/Results: No results found.  aspirin EC  81 mg Oral Daily   carvedilol  12.5 mg Oral BID WC   Chlorhexidine Gluconate Cloth  6 each Topical Daily   citalopram  20 mg Oral BID   febuxostat  80 mg Oral q morning   fenofibrate  160 mg Oral Daily   icosapent Ethyl  2 g Oral Daily   insulin aspart  0-15 Units Subcutaneous TID WC   insulin aspart  0-5 Units Subcutaneous QHS   insulin aspart  3 Units Subcutaneous TID WC   insulin detemir  20 Units Subcutaneous BID   isosorbide mononitrate  120 mg Oral Daily   omeprazole  20 mg Oral Daily   prasugrel  10 mg Oral Daily   ranolazine  500 mg Oral Daily   rosuvastatin  5 mg Oral Daily   senna-docusate  1 tablet Oral BID    BMET    Component Value Date/Time   NA 140 11/02/2021 0500   K 4.0 11/02/2021 0500  CL 103 11/02/2021 0500   CO2 25 11/02/2021 0500   GLUCOSE 187 (H) 11/02/2021 0500   BUN 75 (H) 11/02/2021 0500   CREATININE 4.82 (H) 11/02/2021 0500   CALCIUM 9.3 11/02/2021 0500   GFRNONAA 10 (L) 11/02/2021 0500   GFRAA 33 (L) 06/15/2019 1237   CBC    Component Value Date/Time   WBC 7.7 11/02/2021 0500   RBC 2.74 (L) 11/02/2021 0500   HGB 7.8 (L) 11/02/2021 0500   HCT 23.2 (L) 11/02/2021 0500   PLT 214 11/02/2021 0500   MCV 84.7 11/02/2021 0500   MCH 28.5 11/02/2021 0500   MCHC 33.6 11/02/2021 0500   RDW 18.6 (H) 11/02/2021 0500   LYMPHSABS 2.4 02/24/2010 1320   MONOABS 0.6 02/24/2010 1320   EOSABS 0.1 02/24/2010 1320   BASOSABS 0.0 02/24/2010 1320   HPI:  [name] is an 56 y.o. female with a morbid obesity, poorly controlled DM, OSA on CPAP, CAD s/p CAVG, chronic diastolic CHF, PAD s/p right great toe amputation, and CKD Stage IV who presented to [facility] ED on 10/25/21 with 3 day history of chest pain.  She was transferred to [facility] due to NSTEMI.  SHe was also noted to have elevated  BNP of 2360 and troponin 1711.  She underwent cardiac cath on 10/27/21 which revealed 35% mid LM, ost LAD 70%, mid LAD 100%, 1st Mrg 70%, mid Cx 80%, Rox RCA to distal RCA 100%, Mid graft 35%, origin to prox graft 100% (severe 3 vessel CAD 2 patent grafts 1 known occluded).  Since the heart cath, her Scr has continued to rise and UOP has fallen.  We were consulted to further evaluate and manage her AKI/CKD stage IV.   She reports that she has never seen an Nephrologist, nor told that she had kidney disease, however her PCP at [facility] has documented CKD and her Scr has ranged 2-3.4 over the past year with several episodes of AKI/CKD.  Assessment/Plan:  AKI/CKD stage IV - likely combination of contrast-induced nephropathy as well as acute on chronic diastolic CHF and cardiorenal syndrome.  UOP has dropped off over the last 24 hours since holding diuretics.  She will need to resume IV lasix and follow her response.  Urine studies consistent with pre-renal (FeNa was 0.59%) and renal US unremarkable.  THis is most consistent with cardiorenal syndrome and will need to restart IV furosemide and follow UOP and renal function.  I did discuss with her the severity and chronicity of her kidney disease, however she simply states "It will be fine" and doesn't want to discuss the likelihood of eventual need for dialysis.  Scr and Na improving.  Continue with diuresis.  No indication for dialysis at this time.  Avoid nephrotoxic medications including NSAIDs and iodinated intravenous contrast exposure unless the latter is absolutely indicated.   Preferred narcotic agents for pain control are hydromorphone, fentanyl, and methadone. Morphine should not be used.  Avoid Baclofen and avoid oral sodium phosphate and magnesium citrate based laxatives / bowel preps.  Continue strict Input and Output monitoring.  Will monitor the patient closely with you and intervene or adjust therapy as indicated by changes in clinical status/labs   NSTEMI - s/p cath without acute change of underlying CAD anatomy. Acute on chronic diastolic CHF - as above.  Restarted IV lasix and follow. Anemia of CKD stage IV - transfuse for Hgb <7.  ESA on hold due to CHF exacerbation. Esophagitis/gastritis - on protonix. DM type 2 - per  primary OSA on CPAP Morbid obesity  Donetta Potts, MD [facility]

## 2021-11-03 DIAGNOSIS — I50810 Right heart failure, unspecified: Secondary | ICD-10-CM | POA: Diagnosis not present

## 2021-11-03 DIAGNOSIS — I1310 Hypertensive heart and chronic kidney disease without heart failure, with stage 1 through stage 4 chronic kidney disease, or unspecified chronic kidney disease: Secondary | ICD-10-CM

## 2021-11-03 DIAGNOSIS — I5033 Acute on chronic diastolic (congestive) heart failure: Secondary | ICD-10-CM | POA: Diagnosis not present

## 2021-11-03 DIAGNOSIS — I214 Non-ST elevation (NSTEMI) myocardial infarction: Secondary | ICD-10-CM | POA: Diagnosis not present

## 2021-11-03 LAB — PROTEIN ELECTROPHORESIS, SERUM
A/G Ratio: 0.8 (ref 0.7–1.7)
Albumin ELP: 3 g/dL (ref 2.9–4.4)
Alpha-1-Globulin: 0.3 g/dL (ref 0.0–0.4)
Alpha-2-Globulin: 0.8 g/dL (ref 0.4–1.0)
Beta Globulin: 1.1 g/dL (ref 0.7–1.3)
Gamma Globulin: 1.5 g/dL (ref 0.4–1.8)
Globulin, Total: 3.7 g/dL (ref 2.2–3.9)
Total Protein ELP: 6.7 g/dL (ref 6.0–8.5)

## 2021-11-03 LAB — GLUCOSE, CAPILLARY
Glucose-Capillary: 179 mg/dL — ABNORMAL HIGH (ref 70–99)
Glucose-Capillary: 151 mg/dL — ABNORMAL HIGH (ref 70–99)
Glucose-Capillary: 172 mg/dL — ABNORMAL HIGH (ref 70–99)
Glucose-Capillary: 132 mg/dL — ABNORMAL HIGH (ref 70–99)

## 2021-11-03 LAB — CBC
HCT: 21.6 % — ABNORMAL LOW (ref 36.0–46.0)
Hemoglobin: 7.1 g/dL — ABNORMAL LOW (ref 12.0–15.0)
MCH: 27.8 pg (ref 26.0–34.0)
MCHC: 32.9 g/dL (ref 30.0–36.0)
MCV: 84.7 fL (ref 80.0–100.0)
Platelets: 211 10*3/uL (ref 150–400)
RBC: 2.55 MIL/uL — ABNORMAL LOW (ref 3.87–5.11)
RDW: 19 % — ABNORMAL HIGH (ref 11.5–15.5)
WBC: 6.6 10*3/uL (ref 4.0–10.5)
nRBC: 0.5 % — ABNORMAL HIGH (ref 0.0–0.2)

## 2021-11-03 LAB — RENAL FUNCTION PANEL
Albumin: 2.8 g/dL — ABNORMAL LOW (ref 3.5–5.0)
Anion gap: 8 (ref 5–15)
BUN: 76 mg/dL — ABNORMAL HIGH (ref 6–20)
CO2: 26 mmol/L (ref 22–32)
Calcium: 9 mg/dL (ref 8.9–10.3)
Chloride: 105 mmol/L (ref 98–111)
Creatinine, Ser: 4.46 mg/dL — ABNORMAL HIGH (ref 0.44–1.00)
GFR, Estimated: 11 mL/min — ABNORMAL LOW (ref >60)
Glucose, Bld: 183 mg/dL — ABNORMAL HIGH (ref 70–99)
Phosphorus: 4.6 mg/dL (ref 2.5–4.6)
Potassium: 4 mmol/L (ref 3.5–5.1)
Sodium: 139 mmol/L (ref 135–145)

## 2021-11-03 LAB — PREPARE RBC (CROSSMATCH)

## 2021-11-03 LAB — COOXEMETRY PANEL
Carboxyhemoglobin: 1.9 % — ABNORMAL HIGH (ref 0.5–1.5)
Methemoglobin: 3 % — ABNORMAL HIGH (ref 0.0–1.5)
O2 Saturation: 67.3 %
Total hemoglobin: 7.7 g/dL — ABNORMAL LOW (ref 12.0–16.0)

## 2021-11-03 MED ORDER — SODIUM CHLORIDE 0.9% IV SOLUTION: Freq: Once | INTRAVENOUS | Status: DC

## 2021-11-03 MED ORDER — FUROSEMIDE 10 MG/ML IJ SOLN
120.0000 mg | Freq: Two times a day (BID) | INTRAVENOUS | Status: AC
  Administered 2021-11-03 – 2021-11-04 (×2): 120 mg via INTRAVENOUS
  Filled 2021-11-03: qty 2
  Filled 2021-11-03: qty 10

## 2021-11-03 NOTE — Progress Notes (Signed)
[facility] Progress Note   Assessment/ Plan:   AKI/CKD stage IV - likely combination of contrast-induced nephropathy as well as acute on chronic diastolic CHF and cardiorenal syndrome.  UOP has dropped off over the last 24 hours since holding diuretics.  She will need to resume IV lasix and follow her response.  Urine studies consistent with pre-renal (FeNa was 0.59%) and renal US unremarkable.  THis is most consistent with cardiorenal syndrome.  Avoid nephrotoxic medications including NSAIDs and iodinated intravenous contrast exposure unless the latter is absolutely indicated.   Preferred narcotic agents for pain control are hydromorphone, fentanyl, and methadone. Morphine should not be used.  Avoid Baclofen and avoid oral sodium phosphate and magnesium citrate based laxatives / bowel preps.  Continue strict Input and Output monitoring.  Will monitor the patient closely with you and intervene or adjust therapy as indicated by changes in clinical status/labs  Agree with IV Lasix- hopeful that Cr will improve.  No indication for HD at this time, pt was reluctant previously to discuss NSTEMI - s/p cath without acute change of underlying CAD anatomy. Acute on chronic diastolic CHF - as above.  Restarted IV lasix and follow. Anemia of CKD stage IV - transfuse for Hgb <7.  ESA on hold due to CHF exacerbation. Esophagitis/gastritis - on protonix. DM type 2 - per primary OSA on CPAP Morbid obesity  Subjective:   Seen in room.  Eating breakfast.  Cr coming down, getting more IV Lasix today   Objective:   BP 133/76 (BP Location: Left Arm)   Pulse 80   Temp 98.3 F (36.8 C) (Oral)   Resp 16   Ht '5\' 7"'$  (1.702 m)   Wt (!) 149.8 kg   SpO2 90%   BMI 51.72 kg/m   Physical Exam: Gen:NAD, sitting up CVS:RRR Resp: clear Abd: + pannus Ext: 2+ LE edema with cobblestoning  Labs: BMET Recent Labs  Lab 10/28/21 1400 10/29/21 0500 10/30/21 0500 10/31/21 0640 11/01/21 0459  11/02/21 0500 11/03/21 0554  NA 141 138 134* 133* 135 140 139  K 4.1 4.1 4.3 4.3 3.9 4.0 4.0  CL 107 103 101 100 103 103 105  CO2 '26 26 25 24 24 25 26  '$ GLUCOSE 189* 169* 191* 205* 181* 187* 183*  BUN 28* 33* 46* 62* 73* 75* 76*  CREATININE 2.11* 2.82* 4.19* 4.92* 5.22* 4.82* 4.46*  CALCIUM 9.3 9.3 9.1 9.2 9.0 9.3 9.0  PHOS  --   --   --   --   --  5.1* 4.6   CBC Recent Labs  Lab 10/31/21 0640 11/01/21 0459 11/02/21 0500 11/03/21 0554  WBC 7.5 6.6 7.7 6.6  HGB 7.1* 7.4* 7.8* 7.1*  HCT 22.3* 22.1* 23.2* 21.6*  MCV 85.1 84.4 84.7 84.7  PLT 175 188 214 211      Medications:     sodium chloride   Intravenous Once   aspirin EC  81 mg Oral Daily   carvedilol  12.5 mg Oral BID WC   Chlorhexidine Gluconate Cloth  6 each Topical Daily   citalopram  20 mg Oral BID   febuxostat  80 mg Oral q morning   fenofibrate  160 mg Oral Daily   icosapent Ethyl  2 g Oral Daily   insulin aspart  0-15 Units Subcutaneous TID WC   insulin aspart  0-5 Units Subcutaneous QHS   insulin aspart  3 Units Subcutaneous TID WC   insulin detemir  20 Units Subcutaneous BID  isosorbide mononitrate  120 mg Oral Daily   omeprazole  20 mg Oral Daily   prasugrel  10 mg Oral Daily   ranolazine  500 mg Oral Daily   rosuvastatin  5 mg Oral Daily   senna-docusate  1 tablet Oral BID     [name], MD 11/03/2021, 11:38 AM

## 2021-11-03 NOTE — Progress Notes (Signed)
Blood Consent form obtained and placed in pt's chart. Lavenia Atlas, RN

## 2021-11-03 NOTE — Progress Notes (Signed)
[facility] PROGRESS NOTE    Progress Note  [name]  PTW:[id number] DOB: 1965-04-16 DOA: 10/25/2021 PCP: Jacklynn Ganong, MD     Brief Narrative:   [name] is an 56 y.o. female past medical history of CAD s/p CABG in 2010, subsequently underwent angioplasty with a drug-eluting stent in 7001, chronic diastolic heart failure, chronic kidney disease stage IV, diabetes mellitus type 2, essential hypertension anemia of chronic disease paroxysmal atrial fibrillation, who presented to [facility] with complaints of 3 days of intermittent chest pain radiating to her left arm intermittent associated with nausea and shortness of breath.  Had chest pain hospital cardiac markers were elevated chest x-ray showed pulmonary edema placed on IV heparin and nitroglycerin transferred to [facility] for cardiology evaluation   Significant Events: Cardiology Heart failure team [facility]   Assessment/Plan:   Acute NSTEMI (non-ST elevated myocardial infarction) [facility]) Cardiac cath on 10/27/2021 showed diffuse coronary artery disease no need for intervention. She was continue on aspirin, Effient (for responded to Plavix) Imdur, statins, Ranexa and low-dose Coreg. Cardiology decreased dose of Coreg and Ranexa due to elevation in her creatinine.  Acute on chronic heart failure with preserved ejection fraction (HFpEF) ([place]) Admitting x-ray showed pulmonary edema was gently diuresed but then required volume resuscitation due to renal dysfunction. Echo on this admission showed an EF of 55% PICC line was placed to monitor CVP due to her body habitus. Currently on Coreg, Imdur. She is negative about 2 L, diuretics being managed by nephrology.  Acute kidney injury Chronic kidney disease stage IV/Cardiorenal syndrome: With a baseline creatinine of around 2.3.  Renal ultrasound showed no hydronephrosis. Continue high-dose diuretic therapy has had good urine output not uremic her creatinine is  improving slowly. Further management per nephrology.  Wound of the right foot with a history of post first toe amputation: X-ray did not show any osteomyelitis no evidence of tenosynovitis, no septic arthritis or osteomyelitis. Remained afebrile with no leukocytosis. Blister has spontaneously rupture will continue wound care.  Essential hypertension See heart failure for medications.  Diabetes mellitus type 2: With an A1c of 6.9 patient has been reluctant to adjust her insulin despite that her blood glucose remains poorly controlled. She agreed to insulin 20 units twice a day blood glucose is much improved.  Continue sliding scale insulin.  Anemia of chronic renal disease/anemia of chronic disease: Due to her NSTEMI was placed on IV heparin. GI was consulted on 10/28/2020; EGD showed grade C esophagitis no bleeding, gastritis.   GI recommended Protonix p.o. twice daily. Colonoscopy-showed 2 polyps which were removed diverticulosis no evidence of bleeding, some internal hemorrhoids. Treated with IV Feraheme Hemoglobin slowly trending down today is 7.1, will discuss with renal and transfuse 1 unit of packed red blood cells. She denies any bright red blood per rectum or melanotic stools. No signs of overt bleeding.  Esophagitis/gastritis: Diagnosed by EGD on 10/28/2021. Protonix p.o. twice daily. Will need repeat EGD in 12 weeks, further management per GI.  Obstructive sleep apnea: Noncompliant with BiPAP while in house.  Hyperlipidemia associated with diabetes mellitus type 2: Continue statins.   DVT prophylaxis: scd Family Communication:none Status is: Inpatient Remains inpatient appropriate because: Acute heart failure now with a GI bleed.    Code Status:     Code Status Orders  (From admission, onward)           Start     Ordered   10/25/21 0059  Full code  Continuous  10/25/21 0101           Code Status History     Date Active Date Inactive Code  Status Order ID Comments User Context   10/01/2018 2219 10/17/2018 2118 Full Code [id number]  Nila Nephew, MD Inpatient         IV Access:   Peripheral IV   Procedures and diagnostic studies:   No results found.   Medical Consultants:   None.   Subjective:    [name] no complaints today.  Objective:    Vitals:   11/02/21 1952 11/03/21 0100 11/03/21 0349 11/03/21 0734  BP: (!) 140/74 (!) 153/84 (!) 134/53 (!) 129/59  Pulse: 80 91 85 100  Resp: '15 18 18 15  '$ Temp: 97.8 F (36.6 C) 97.7 F (36.5 C) 98 F (36.7 C) 98.4 F (36.9 C)  TempSrc: Oral Oral Oral Oral  SpO2: 96% 98% 100% 90%  Weight:      Height:       SpO2: 90 % O2 Flow Rate (L/min): 2 L/min   Intake/Output Summary (Last 24 hours) at 11/03/2021 0807 Last data filed at 11/03/2021 0100 Gross per 24 hour  Intake 240 ml  Output 2700 ml  Net -2460 ml    Filed Weights   10/30/21 0625 10/31/21 0419 11/01/21 0500  Weight: (!) 150.1 kg (!) 150.4 kg (!) 149.8 kg    Exam: General exam: In no acute distress. Respiratory system: Good air movement and clear to auscultation. Cardiovascular system: S1 & S2 heard, RRR. No JVD. Gastrointestinal system: Abdomen is nondistended, soft and nontender.  Extremities: No pedal edema. Skin: No rashes, lesions or ulcers Psychiatry: Judgement and insight appear normal. Mood & affect appropriate. Data Reviewed:    Labs: Basic Metabolic Panel: Recent Labs  Lab 10/29/21 0500 10/30/21 0500 10/31/21 0640 11/01/21 0459 11/02/21 0500 11/03/21 0554  NA 138 134* 133* 135 140 139  K 4.1 4.3 4.3 3.9 4.0 4.0  CL 103 101 100 103 103 105  CO2 '26 25 24 24 25 26  '$ GLUCOSE 169* 191* 205* 181* 187* 183*  BUN 33* 46* 62* 73* 75* 76*  CREATININE 2.82* 4.19* 4.92* 5.22* 4.82* 4.46*  CALCIUM 9.3 9.1 9.2 9.0 9.3 9.0  MG 1.8  --   --   --   --   --   PHOS  --   --   --   --  5.1* 4.6    GFR Estimated Creatinine Clearance: 21.3 mL/min (A) (by C-G formula based on  SCr of 4.46 mg/dL (H)). Liver Function Tests: Recent Labs  Lab 11/02/21 0500 11/03/21 0554  ALBUMIN 3.0* 2.8*    No results for input(s): "LIPASE", "AMYLASE" in the last 168 hours. No results for input(s): "AMMONIA" in the last 168 hours. Coagulation profile No results for input(s): "INR", "PROTIME" in the last 168 hours.  COVID-19 Labs  No results for input(s): "DDIMER", "FERRITIN", "LDH", "CRP" in the last 72 hours.   Lab Results  Component Value Date   Geronimo NEGATIVE 10/06/2018    CBC: Recent Labs  Lab 10/30/21 0500 10/31/21 0640 11/01/21 0459 11/02/21 0500 11/03/21 0554  WBC 6.8 7.5 6.6 7.7 6.6  HGB 7.3* 7.1* 7.4* 7.8* 7.1*  HCT 21.7* 22.3* 22.1* 23.2* 21.6*  MCV 83.1 85.1 84.4 84.7 84.7  PLT 167 175 188 214 211    Cardiac Enzymes: No results for input(s): "CKTOTAL", "CKMB", "CKMBINDEX", "TROPONINI" in the last 168 hours. BNP (last 3 results) No results for input(s): "PROBNP" in the  last 8760 hours. CBG: Recent Labs  Lab 11/02/21 0933 11/02/21 1118 11/02/21 1717 11/02/21 2152 11/03/21 0601  GLUCAP 132* 126* 154* 150* 179*    D-Dimer: No results for input(s): "DDIMER" in the last 72 hours. Hgb A1c: No results for input(s): "HGBA1C" in the last 72 hours. Lipid Profile: No results for input(s): "CHOL", "HDL", "LDLCALC", "TRIG", "CHOLHDL", "LDLDIRECT" in the last 72 hours.  Thyroid function studies: No results for input(s): "TSH", "T4TOTAL", "T3FREE", "THYROIDAB" in the last 72 hours.  Invalid input(s): "FREET3" Anemia work up: No results for input(s): "VITAMINB12", "FOLATE", "FERRITIN", "TIBC", "IRON", "RETICCTPCT" in the last 72 hours.  Sepsis Labs: Recent Labs  Lab 10/31/21 0640 11/01/21 0459 11/02/21 0500 11/03/21 0554  WBC 7.5 6.6 7.7 6.6    Microbiology No results found for this or any previous visit (from the past 240 hour(s)).   Medications:    aspirin EC  81 mg Oral Daily   carvedilol  12.5 mg Oral BID WC    Chlorhexidine Gluconate Cloth  6 each Topical Daily   citalopram  20 mg Oral BID   febuxostat  80 mg Oral q morning   fenofibrate  160 mg Oral Daily   icosapent Ethyl  2 g Oral Daily   insulin aspart  0-15 Units Subcutaneous TID WC   insulin aspart  0-5 Units Subcutaneous QHS   insulin aspart  3 Units Subcutaneous TID WC   insulin detemir  20 Units Subcutaneous BID   isosorbide mononitrate  120 mg Oral Daily   omeprazole  20 mg Oral Daily   prasugrel  10 mg Oral Daily   ranolazine  500 mg Oral Daily   rosuvastatin  5 mg Oral Daily   senna-docusate  1 tablet Oral BID   Continuous Infusions:      LOS: 9 days   Charlynne Cousins  [facility]  11/03/2021, 8:07 AM

## 2021-11-03 NOTE — Progress Notes (Signed)
Physical Therapy Treatment Patient Details Name: [name] MRN: [id number] DOB: 09/20/1965 Today's Date: 11/03/2021   History of Present Illness Pt is a 56 y.o. female admitted 10/25/21 with chest pain; workup for NSTEMI, CHF. S/p LHC 8/7 showing severe 3-vessel obstructive CAD. Echo with stable LV EF 55-60%. S/p colonoscopy and EGD 8/8. Pt also c/o R foot pain; MRI consistent with skin blistering. 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    PT Comments    Pt with 7.1 hgb and scheduled to receive one unit PRBC. Session, therefore, limited to in room due to fatigue. She required min assist bed mobility, min guard assist transfers, and min guard assist amb 20' + 15" without AD. Mildly unsteady gait but pt declining use of RW. Pt in recliner at end of session.    Recommendations for follow up therapy are one component of a multi-disciplinary discharge planning process, led by the attending physician.  Recommendations may be updated based on patient status, additional functional criteria and insurance authorization.  Follow Up Recommendations  Home health PT     Assistance Recommended at Discharge Set up Supervision/Assistance  Patient can return home with the following A little help with bathing/dressing/bathroom;Assistance with cooking/housework;Assist for transportation;Help with stairs or ramp for entrance   Equipment Recommendations  Rollator (4 wheels) (bariatric)    Recommendations for Other Services       Precautions / Restrictions Precautions Precautions: Fall;Other (comment) Precaution Comments: Watch O2 sats Restrictions Weight Bearing Restrictions: No     Mobility  Bed Mobility Overal bed mobility: Needs Assistance Bed Mobility: Supine to Sit     Supine to sit: Min assist, HOB elevated          Transfers Overall transfer level: Needs assistance Equipment used: None Transfers: Sit to/from  Stand Sit to Stand: Min guard                Ambulation/Gait Ambulation/Gait assistance: Min guard Gait Distance (Feet): 20 Feet (+ 15') Assistive device: None Gait Pattern/deviations: Step-through pattern, Decreased stride length, Wide base of support Gait velocity: Decreased     General Gait Details: mildly unsteady. Slow, guarded gait. Pt declining use of RW.   Stairs             Wheelchair Mobility    Modified Rankin (Stroke Patients Only)       Balance Overall balance assessment: Needs assistance Sitting-balance support: Feet supported, No upper extremity supported Sitting balance-Leahy Scale: Good     Standing balance support: No upper extremity supported, During functional activity, Single extremity supported Standing balance-Leahy Scale: Fair                              Cognition Arousal/Alertness: Awake/alert Behavior During Therapy: WFL for tasks assessed/performed Overall Cognitive Status: Within Functional Limits for tasks assessed                                          Exercises      General Comments General comments (skin integrity, edema, etc.): Hgb 7.1 with order to receive one unit PRBC      Pertinent Vitals/Pain Pain Assessment Pain Assessment: No/denies pain    Home Living  Prior Function            PT Goals (current goals can now be found in the care plan section) Acute Rehab PT Goals Patient Stated Goal: home Progress towards PT goals: Progressing toward goals    Frequency    Min 3X/week      PT Plan Current plan remains appropriate    Co-evaluation              AM-PAC PT "6 Clicks" Mobility   Outcome Measure  Help needed turning from your back to your side while in a flat bed without using bedrails?: A Little Help needed moving from lying on your back to sitting on the side of a flat bed without using bedrails?: A Little Help needed  moving to and from a bed to a chair (including a wheelchair)?: A Little Help needed standing up from a chair using your arms (e.g., wheelchair or bedside chair)?: A Little Help needed to walk in hospital room?: A Little Help needed climbing 3-5 steps with a railing? : A Lot 6 Click Score: 17    End of Session   Activity Tolerance: Patient limited by fatigue Patient left: in chair;with call bell/phone within reach Nurse Communication: Mobility status PT Visit Diagnosis: Other abnormalities of gait and mobility (R26.89)     Time: 6389-3734 PT Time Calculation (min) (ACUTE ONLY): 23 min  Charges:  $Gait Training: 23-37 mins                     Lorrin Goodell, PT  Office # [phone] Pager [phone]    Lorriane Shire 11/03/2021, 11:52 AM

## 2021-11-03 NOTE — Progress Notes (Addendum)
Advanced Heart Failure Rounding Note  PCP-Cardiologist: None   Subjective:     Creatinine 2.1>2.8>4.19>4.9> 5.22>4.8>4.5  Nephrololgy following. Last dose IV lasix 08/13.  Good UOP. CVP 18 this am.  No weight X 2 days.  Feeling okay. No dyspnea at rest. Reports abdomen is less distended.     Objective:   Weight Range: (!) 149.8 kg Body mass index is 51.72 kg/m.   Vital Signs:   Temp:  [97.5 F (36.4 C)-98.4 F (36.9 C)] 98.4 F (36.9 C) (08/14 0734) Pulse Rate:  [77-100] 100 (08/14 0734) Resp:  [14-18] 15 (08/14 0734) BP: (115-153)/(53-84) 129/59 (08/14 0734) SpO2:  [90 %-100 %] 90 % (08/14 0734) Last BM Date : 11/02/21  Weight change: Filed Weights   10/30/21 0625 10/31/21 0419 11/01/21 0500  Weight: (!) 150.1 kg (!) 150.4 kg (!) 149.8 kg    Intake/Output:   Intake/Output Summary (Last 24 hours) at 11/03/2021 0810 Last data filed at 11/03/2021 0100 Gross per 24 hour  Intake 240 ml  Output 2700 ml  Net -2460 ml      Physical Exam   General:  No distress. Lying in bed.  HEENT: normal Neck: supple. JVP difficult to assess d/t neck size. Carotids 2+ bilat; no bruits.  Cor: PMI nondisplaced. Regular rate & rhythm. No rubs, gallops or murmurs. Lungs: clear Abdomen: obese, soft, nontender, nondistended.  Extremities: no cyanosis, clubbing, rash, 1-2 + edema, chronic stasis changes noted Neuro: alert & orientedx3, cranial nerves grossly intact. moves all 4 extremities w/o difficulty. Affect pleasant     Telemetry   SR 80s   Labs    CBC Recent Labs    11/02/21 0500 11/03/21 0554  WBC 7.7 6.6  HGB 7.8* 7.1*  HCT 23.2* 21.6*  MCV 84.7 84.7  PLT 214 973   Basic Metabolic Panel Recent Labs    11/02/21 0500 11/03/21 0554  NA 140 139  K 4.0 4.0  CL 103 105  CO2 25 26  GLUCOSE 187* 183*  BUN 75* 76*  CREATININE 4.82* 4.46*  CALCIUM 9.3 9.0  PHOS 5.1* 4.6   Liver Function Tests Recent Labs    11/02/21 0500 11/03/21 0554   ALBUMIN 3.0* 2.8*    No results for input(s): "LIPASE", "AMYLASE" in the last 72 hours. Cardiac Enzymes No results for input(s): "CKTOTAL", "CKMB", "CKMBINDEX", "TROPONINI" in the last 72 hours.  BNP: BNP (last 3 results) No results for input(s): "BNP" in the last 8760 hours.  ProBNP (last 3 results) No results for input(s): "PROBNP" in the last 8760 hours.   D-Dimer No results for input(s): "DDIMER" in the last 72 hours. Hemoglobin A1C No results for input(s): "HGBA1C" in the last 72 hours. Fasting Lipid Panel No results for input(s): "CHOL", "HDL", "LDLCALC", "TRIG", "CHOLHDL", "LDLDIRECT" in the last 72 hours.  Thyroid Function Tests No results for input(s): "TSH", "T4TOTAL", "T3FREE", "THYROIDAB" in the last 72 hours.  Invalid input(s): "FREET3"  Other results:   Imaging    No results found.   Medications:     Scheduled Medications:  aspirin EC  81 mg Oral Daily   carvedilol  12.5 mg Oral BID WC   Chlorhexidine Gluconate Cloth  6 each Topical Daily   citalopram  20 mg Oral BID   febuxostat  80 mg Oral q morning   fenofibrate  160 mg Oral Daily   icosapent Ethyl  2 g Oral Daily   insulin aspart  0-15 Units Subcutaneous TID WC   insulin aspart  0-5 Units Subcutaneous QHS   insulin aspart  3 Units Subcutaneous TID WC   insulin detemir  20 Units Subcutaneous BID   isosorbide mononitrate  120 mg Oral Daily   omeprazole  20 mg Oral Daily   prasugrel  10 mg Oral Daily   ranolazine  500 mg Oral Daily   rosuvastatin  5 mg Oral Daily   senna-docusate  1 tablet Oral BID    Infusions:     PRN Medications: acetaminophen, ondansetron **OR** ondansetron (ZOFRAN) IV, oxyCODONE, polyethylene glycol, traMADol, zolpidem    Patient Profile   56 y.o. female with history of CAD s/p CABG followed by mulitple PCIs, chronic diastolic CHF, HLD, obesity, venous insufficiency, recurrent LE wounds, CKD III, HTN, OSA, anemia.    Admitted with NSTEMI, AKI on CKD, acute  on chronic anemia and notable RLE wound.  Assessment/Plan   1. CAD:  Very aggressive coronary disease.  Had 4 more drug-eluting stents placed in 12/11, 1 in the SVG-LAD and 3 to open the totally occluded native RCA after occlusion of the SVG-PDA. She was deemed not to be a candidate for re-do CABG at that admission by cardiac surgery. Unfortunately, due to her body habitus, the surgeons were unable to mobilize her LIMA for grafting during her initial CABG operation. Lexiscan Cardiolite in 3/14 showed no ischemia or infarction though study was limited by body habitus.  NSTEMI in 7/20, LHC showed occluded SVG-RCA and native RCA, occluded mLAD, 80% in-stent restenosis in the SVG-LAD, 95% mLCx stenosis.  DES to SVG-LAD, LCx thought to be too small to intervene upon.  NSTEMI 11/21, occluded SVG-LAD treated with PTCA.  NSTEMI this admission with HS-TnI 1711 => 1943.  Cath this admit with chronic occlusion of RCA and SVG-RCA, 70% ostial LAD, occluded mid LAD, patent SVG-LAD, patent SVG-D, stable 80% mLCx and 70% OM1 stenoses (diffuse disease in LCx system).  No target for intervention. Possible rise in TnI due to demand ischemia with anemia and volume overload in the setting of chronic coronary diffuse coronary disease.  - No angina currently - Has been on Effient long-term since she is a poor Plavix responder and has multiple DES.   - Continue ASA 81. - Continue Crestor.    - Continue Coreg, Imdur, and ranolazine. Continue lower dose of coreg and Ranexa  in setting of elevated creatinine.   ? Need to stop Ranexa. Will d/w PharmD  2. Acute on chronic diastolic CHF: NYHA class III symptoms.  Torsemide recently decreased from 60 bid to 20 bid, unsure why.  This may have triggered volume overload/CHF.  Echo this admission with EF 55-60%, moderate LVH, normal RV.  LVEDP 33 on LHC.  Fluid overloaded on exam. CVP 18. Give 120 mg lasix IV BID today. - Worsening renal function, now starting to improve. Nephrology  following. - Wound not use SGLT2-inhibitor candidate given body habitus (risk of UTIs/yeast infections).  3. Hyperlipidemia: She is on Vascepa, Crestor, and fenofibrate.  4. Obesity: Ideally would start semaglutide or dulaglutide.  5.Venous insufficiency:  ABIs normal last check. Venous stasis ulcer right foot followed by wound care.  MRI this admission did not show osteomyelitis.  - ABIs: noncompressible R/L 6. AKI on CKD: Stage 3/Possible contrast nephropathy.  Creatinine appears to run 2.1 - 2.2 baseline.  Was down to 1.9 but now up to 5.2  did have contrast with LHC.  -Renal US- no hydronephrosis.  - Creatinine continues to rise 2.8 >4.19 >4.9 > 5.2 > 4.8 > 4.5 -  Nephrology following. - Still making good urine. Weight down. Hopeful for renal recovery. Avoid nephrotoxins 7. OSA: Needs CPAP.  8. Anemia: Hgb 7.1 today. Has 1 u RBCs ordered - Fe studies c/w IDA. B12 and folate normal.  - Treated w/ feraheme  - FOBT pending  - GI consulted. S/P EGD/ colonoscopy--esophagitis.  - PPI    Length of Stay: 9  FINCH, LINDSAY N, PA-C  11/03/2021, 8:10 AM  Advanced Heart Failure Team Pager [phone] (M-F; 7a - 5p)  Please contact [facility] for night-coverage after hours (5p -7a ) and weekends on [email or web address]  Patient seen with PA, agree with the above note.   Good diuresis yesterday with Lasix IV dose.  CVP 17-18 today.  Creatinine lower at 4.46.  Hgb lower at 7.1.   She denies dyspnea walking to bathroom.   General: NAD Neck: JVP difficult, no thyromegaly or thyroid nodule.  Lungs: Clear to auscultation bilaterally with normal respiratory effort. CV: Nondisplaced PMI.  Heart regular S1/S2, no S3/S4, no murmur.  Chronic edema to knees.  Abdomen: Soft, nontender, no hepatosplenomegaly, no distention.  Skin: Intact without lesions or rashes.  Neurologic: Alert and oriented x 3.  Psych: Normal affect. Extremities: No clubbing or cyanosis.  HEENT: Normal.   Good diuresis yesterday,  creatinine trending down. Still volume overloaded with elevated CVP.  - Would continue Lasix 120 mg IV bid today.   Will give 1 unit PRBCs today.    Loralie Champagne 11/03/2021 8:45 AM

## 2021-11-04 DIAGNOSIS — I5033 Acute on chronic diastolic (congestive) heart failure: Secondary | ICD-10-CM | POA: Diagnosis not present

## 2021-11-04 DIAGNOSIS — I214 Non-ST elevation (NSTEMI) myocardial infarction: Secondary | ICD-10-CM | POA: Diagnosis not present

## 2021-11-04 LAB — RENAL FUNCTION PANEL
Albumin: 2.8 g/dL — ABNORMAL LOW (ref 3.5–5.0)
Anion gap: 8 (ref 5–15)
BUN: 75 mg/dL — ABNORMAL HIGH (ref 6–20)
CO2: 28 mmol/L (ref 22–32)
Calcium: 8.8 mg/dL — ABNORMAL LOW (ref 8.9–10.3)
Chloride: 102 mmol/L (ref 98–111)
Creatinine, Ser: 4.62 mg/dL — ABNORMAL HIGH (ref 0.44–1.00)
GFR, Estimated: 10 mL/min — ABNORMAL LOW (ref >60)
Glucose, Bld: 180 mg/dL — ABNORMAL HIGH (ref 70–99)
Phosphorus: 4.5 mg/dL (ref 2.5–4.6)
Potassium: 3.9 mmol/L (ref 3.5–5.1)
Sodium: 138 mmol/L (ref 135–145)

## 2021-11-04 LAB — BPAM RBC
Blood Product Expiration Date: 202308212359
ISSUE DATE / TIME: 202308141806
Unit Type and Rh: 600

## 2021-11-04 LAB — CBC
HCT: 23.8 % — ABNORMAL LOW (ref 36.0–46.0)
Hemoglobin: 8 g/dL — ABNORMAL LOW (ref 12.0–15.0)
MCH: 27.9 pg (ref 26.0–34.0)
MCHC: 33.6 g/dL (ref 30.0–36.0)
MCV: 82.9 fL (ref 80.0–100.0)
Platelets: 216 10*3/uL (ref 150–400)
RBC: 2.87 MIL/uL — ABNORMAL LOW (ref 3.87–5.11)
RDW: 18.7 % — ABNORMAL HIGH (ref 11.5–15.5)
WBC: 6.8 10*3/uL (ref 4.0–10.5)
nRBC: 0.4 % — ABNORMAL HIGH (ref 0.0–0.2)

## 2021-11-04 LAB — TYPE AND SCREEN
ABO/RH(D): A POS
Antibody Screen: POSITIVE
DAT, IgG: NEGATIVE
Donor AG Type: NEGATIVE
PT AG Type: NEGATIVE
Unit division: 0

## 2021-11-04 LAB — GLUCOSE, CAPILLARY
Glucose-Capillary: 142 mg/dL — ABNORMAL HIGH (ref 70–99)
Glucose-Capillary: 150 mg/dL — ABNORMAL HIGH (ref 70–99)
Glucose-Capillary: 213 mg/dL — ABNORMAL HIGH (ref 70–99)
Glucose-Capillary: 222 mg/dL — ABNORMAL HIGH (ref 70–99)
Glucose-Capillary: 232 mg/dL — ABNORMAL HIGH (ref 70–99)
Glucose-Capillary: 240 mg/dL — ABNORMAL HIGH (ref 70–99)

## 2021-11-04 LAB — COOXEMETRY PANEL
Carboxyhemoglobin: 2.5 % — ABNORMAL HIGH (ref 0.5–1.5)
Methemoglobin: 2.3 % — ABNORMAL HIGH (ref 0.0–1.5)
O2 Saturation: 82.4 %
Total hemoglobin: 8.8 g/dL — ABNORMAL LOW (ref 12.0–16.0)

## 2021-11-04 MED ORDER — TORSEMIDE 20 MG PO TABS
80.0000 mg | ORAL_TABLET | Freq: Once | ORAL | Status: AC
  Administered 2021-11-04: 80 mg via ORAL
  Filled 2021-11-04: qty 4

## 2021-11-04 MED ORDER — TORSEMIDE 20 MG PO TABS
60.0000 mg | ORAL_TABLET | Freq: Two times a day (BID) | ORAL | Status: DC
  Administered 2021-11-05: 60 mg via ORAL
  Filled 2021-11-04: qty 3

## 2021-11-04 NOTE — Progress Notes (Signed)
Occupational Therapy Treatment Patient Details Name: [name] MRN: [id number] DOB: 12-01-1965 Today's Date: 11/04/2021   History of present illness Pt is a 56 y.o. female admitted 10/25/21 with chest pain; workup for NSTEMI, CHF. S/p LHC 8/7 showing severe 3-vessel obstructive CAD. Echo with stable LV EF 55-60%. S/p colonoscopy and EGD 8/8. Pt also c/o R foot pain; MRI consistent with skin blistering. 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   OT comments  Pt sitting in recliner upon OT arrival. Session focused on education regarding use of energy conservation strategies during ADL completion. Pt verbalized understanding and reporting use of a few strategies at baseline. Began education on use of AE to complete LB dressing, but session was limited 2/2 to arrival of pt's breakfast and pt adamantly declining further engagement during session. Pt will continue to benefit from skilled OT services to maximize safety and independence with ADL/IADL and functional mobility. Will continue to follow acutely and progress as tolerated.     Recommendations for follow up therapy are one component of a multi-disciplinary discharge planning process, led by the attending physician.  Recommendations may be updated based on patient status, additional functional criteria and insurance authorization.    Follow Up Recommendations  Home health OT    Assistance Recommended at Discharge Intermittent Supervision/Assistance  Patient can return home with the following  Assist for transportation;Help with stairs or ramp for entrance;Assistance with cooking/housework   Equipment Recommendations  None recommended by OT    Recommendations for Other Services      Precautions / Restrictions Precautions Precautions: Fall;Other (comment) Precaution Comments: Watch O2 sats Restrictions Weight Bearing Restrictions: No       Mobility Bed Mobility Overal  bed mobility: Needs Assistance             General bed mobility comments: sitting in recliner upon arrival    Transfers                         Balance                                           ADL either performed or assessed with clinical judgement   ADL Overall ADL's : Needs assistance/impaired Eating/Feeding: Set up Eating/Feeding Details (indicate cue type and reason): setup pt with her breakfast                                   General ADL Comments: educated pt on energy conservation strategies to utilize during ADL and functional mobility. Pt verbalized understanding and reports current use of a few ECS at baseline. Pt encouraged to ambulate to the sink for grooming, but pt adamantly declined and appeared to be increasingly agitated stating "Im not doing anything until I eat my breakfast"    Extremity/Trunk Assessment Upper Extremity Assessment Upper Extremity Assessment: Overall WFL for tasks assessed   Lower Extremity Assessment Lower Extremity Assessment: Defer to PT evaluation        Vision       Perception Perception Perception: Not tested   Praxis Praxis Praxis: Not tested    Cognition Arousal/Alertness: Awake/alert Behavior During Therapy: [facility] for tasks assessed/performed Overall Cognitive Status: Within Functional Limits for tasks assessed  General Comments: WFL for simple tasks; not formally assessed;pt began to become agitated toward end of session stating "im not doing anything until I eat my breakfast"        Exercises      Shoulder Instructions       General Comments educated pt on importance of frequent mobility, pt appeared frustrated with frequent need for bathroom and having to get up so often    Pertinent Vitals/ Pain       Pain Assessment Pain Assessment: No/denies pain Pain Intervention(s): Monitored during session  Home Living                                           Prior Functioning/Environment              Frequency  Min 2X/week        Progress Toward Goals  OT Goals(current goals can now be found in the care plan section)  Progress towards OT goals: Progressing toward goals  Acute Rehab OT Goals Patient Stated Goal: to eat breakfast while it's hot OT Goal Formulation: With patient Time For Goal Achievement: 11/13/21 Potential to Achieve Goals: Good ADL Goals Pt Will Perform Grooming: with modified independence;standing Pt Will Perform Lower Body Dressing: with modified independence;sit to/from stand;with adaptive equipment Pt Will Transfer to Toilet: with modified independence;ambulating;regular height toilet Pt Will Perform Toileting - Clothing Manipulation and hygiene: with modified independence;sit to/from stand  Plan Discharge plan remains appropriate    Co-evaluation                 AM-PAC OT "6 Clicks" Daily Activity     Outcome Measure   Help from another person eating meals?: None Help from another person taking care of personal grooming?: A Little Help from another person toileting, which includes using toliet, bedpan, or urinal?: A Little Help from another person bathing (including washing, rinsing, drying)?: A Lot Help from another person to put on and taking off regular upper body clothing?: A Little Help from another person to put on and taking off regular lower body clothing?: A Lot 6 Click Score: 17    End of Session    OT Visit Diagnosis: Unsteadiness on feet (R26.81)   Activity Tolerance Other (comment) (treatment limited 2/2 pt request to eat breakfast)   Patient Left in chair;with call bell/phone within reach   Nurse Communication        Time: 5597-4163 OT Time Calculation (min): 13 min  Charges: OT General Charges $OT Visit: 1 Visit OT Treatments $Self Care/Home Management : 8-22 mins  Helene Kelp OTR/L Acute Rehabilitation  Services Office: [place] 11/04/2021, 12:42 PM

## 2021-11-04 NOTE — Progress Notes (Signed)
Mobility Specialist Progress Note:   11/04/21 1430  Mobility  Activity Ambulated with assistance in hallway  Level of Assistance Standby assist, set-up cues, supervision of patient - no hands on  Assistive Device Four wheel walker  Distance Ambulated (ft) 100 ft (44+56)  Activity Response Tolerated well  $Mobility charge 1 Mobility   Pre-mobility: 73 HR, 134/49 (70) BP Post-mobility: 82 HR, 114/48 (63) BP  Pt in chair and agreeable. Required 1x seated rest break d/t fatigue. Pt in chair with all needs met and call bell in reach.   Rashee Marschall Mobility Specialist-Acute Rehab Secure Chat only

## 2021-11-04 NOTE — Progress Notes (Signed)
Pt refused taking 20 units of levemir. Pt preferred taking 16 units of levemir instead. Pt claimed her CBG was low due to receiving too much levemir.  MD notified and aware.

## 2021-11-04 NOTE — Progress Notes (Signed)
[facility] PROGRESS NOTE    Progress Note  [name]  UUV:[id number] DOB: [birth year] DOA: 10/25/2021 PCP: Jacklynn Ganong, MD     Brief Narrative:   [name] is an 56 y.o. female past medical history of CAD s/p CABG in 2010, subsequently underwent angioplasty with a drug-eluting stent in 4742, chronic diastolic heart failure, chronic kidney disease stage IV, diabetes mellitus type 2, essential hypertension anemia of chronic disease paroxysmal atrial fibrillation, who presented to [facility] with complaints of 3 days of intermittent chest pain radiating to her left arm intermittent associated with nausea and shortness of breath.  Had chest pain hospital cardiac markers were elevated chest x-ray showed pulmonary edema placed on IV heparin and nitroglycerin transferred to [facility] for cardiology evaluation   Significant Events: Cardiology Heart failure team [facility]   Assessment/Plan:   Acute NSTEMI (non-ST elevated myocardial infarction) [facility]) Cardiac cath on 10/27/2021 showed diffuse coronary artery disease no need for intervention. She was continue on aspirin, Effient (for responded to Plavix) Imdur, statins, Ranexa and low-dose Coreg. Cardiology decreased dose of Coreg and Ranexa due to elevation in her creatinine.  Acute on chronic heart failure with preserved ejection fraction (HFpEF) ([place]) Admitting x-ray showed pulmonary edema was gently diuresed but then required volume resuscitation due to renal dysfunction. Echo on this admission showed an EF of 55% PICC line was placed to monitor CVP due to her body habitus. Currently on Coreg, Imdur. She is negative about 5 L, continue IV diuresis per renal strict I's and O's and daily weights.  Acute kidney injury Chronic kidney disease stage IV/Cardiorenal syndrome: With a baseline creatinine of around 2.3.  Renal ultrasound showed no hydronephrosis. Continue high-dose diuretic therapy has had good urine output not  uremic, her creatinine has plateaued around 4.6. Further management per nephrology.  Wound of the right foot with a history of post first toe amputation: X-ray did not show any osteomyelitis no evidence of tenosynovitis, no septic arthritis or osteomyelitis. Remained afebrile with no leukocytosis. Blister has spontaneously rupture will continue wound care.  Essential hypertension See heart failure for medications.  Diabetes mellitus type 2: With an A1c of 6.9 patient has been reluctant to adjust her insulin despite that her blood glucose remains poorly controlled. She agreed to insulin 20 units twice a day blood glucose is much improved.  Continue sliding scale insulin.  Anemia of chronic renal disease/anemia of chronic disease: Due to her NSTEMI was placed on IV heparin. GI was consulted on 10/28/2020; EGD showed grade C esophagitis no bleeding, gastritis.   GI recommended Protonix p.o. twice daily. Colonoscopy-showed 2 polyps which were removed diverticulosis no evidence of bleeding, some internal hemorrhoids. Treated with IV Feraheme Hemoglobin slowly trending down today is 7.1, will discuss with renal and transfuse 1 unit of packed red blood cells. She denies any bright red blood per rectum or melanotic stools. No signs of overt bleeding.  Esophagitis/gastritis: Diagnosed by EGD on 10/28/2021. Protonix p.o. twice daily. Will need repeat EGD in 12 weeks, further management per GI.  Obstructive sleep apnea: Noncompliant with BiPAP while in house.  Hyperlipidemia associated with diabetes mellitus type 2: Continue statins.   DVT prophylaxis: scd Family Communication:none Status is: Inpatient Remains inpatient appropriate because: Acute heart failure now with a GI bleed.    Code Status:     Code Status Orders  (From admission, onward)           Start     Ordered   10/25/21 0059  Full code  Continuous        10/25/21 0101           Code Status History      Date Active Date Inactive Code Status Order ID Comments User Context   10/01/2018 2219 10/17/2018 2118 Full Code [id number]  Nila Nephew, MD Inpatient         IV Access:   Peripheral IV   Procedures and diagnostic studies:   No results found.   Medical Consultants:   None.   Subjective:    [name] no complaints  Objective:    Vitals:   11/03/21 1826 11/03/21 2103 11/04/21 0010 11/04/21 0509  BP: (!) 105/57 (!) 133/47 127/84 111/73  Pulse: 76 77 78 78  Resp: '14 12 18 17  '$ Temp: 97.6 F (36.4 C) 98 F (36.7 C) 97.9 F (36.6 C) 97.6 F (36.4 C)  TempSrc: Oral Oral Oral Oral  SpO2: 98% 98% 97% 95%  Weight:      Height:       SpO2: 95 % O2 Flow Rate (L/min): 2 L/min   Intake/Output Summary (Last 24 hours) at 11/04/2021 0851 Last data filed at 11/04/2021 0022 Gross per 24 hour  Intake 542 ml  Output 3601 ml  Net -3059 ml    Filed Weights   10/30/21 0625 10/31/21 0419 11/01/21 0500  Weight: (!) 150.1 kg (!) 150.4 kg (!) 149.8 kg    Exam: General exam: In no acute distress. Respiratory system: Good air movement and clear to auscultation. Cardiovascular system: S1 & S2 heard, RRR. No JVD. Gastrointestinal system: Abdomen is nondistended, soft and nontender.  Extremities: No pedal edema. Skin: No rashes, lesions or ulcers Psychiatry: Judgement and insight appear normal. Mood & affect appropriate. Data Reviewed:    Labs: Basic Metabolic Panel: Recent Labs  Lab 10/29/21 0500 10/30/21 0500 10/31/21 0640 11/01/21 0459 11/02/21 0500 11/03/21 0554 11/04/21 0623  NA 138   < > 133* 135 140 139 138  K 4.1   < > 4.3 3.9 4.0 4.0 3.9  CL 103   < > 100 103 103 105 102  CO2 26   < > '24 24 25 26 28  '$ GLUCOSE 169*   < > 205* 181* 187* 183* 180*  BUN 33*   < > 62* 73* 75* 76* 75*  CREATININE 2.82*   < > 4.92* 5.22* 4.82* 4.46* 4.62*  CALCIUM 9.3   < > 9.2 9.0 9.3 9.0 8.8*  MG 1.8  --   --   --   --   --   --   PHOS  --   --   --   --  5.1* 4.6  4.5   < > = values in this interval not displayed.    GFR Estimated Creatinine Clearance: 20.6 mL/min (A) (by C-G formula based on SCr of 4.62 mg/dL (H)). Liver Function Tests: Recent Labs  Lab 11/02/21 0500 11/03/21 0554 11/04/21 0623  ALBUMIN 3.0* 2.8* 2.8*    No results for input(s): "LIPASE", "AMYLASE" in the last 168 hours. No results for input(s): "AMMONIA" in the last 168 hours. Coagulation profile No results for input(s): "INR", "PROTIME" in the last 168 hours.  COVID-19 Labs  No results for input(s): "DDIMER", "FERRITIN", "LDH", "CRP" in the last 72 hours.   Lab Results  Component Value Date   [place] NEGATIVE 10/06/2018    CBC: Recent Labs  Lab 10/31/21 0640 11/01/21 0459 11/02/21 0500 11/03/21 0554 11/04/21 0623  WBC 7.5 6.6  7.7 6.6 6.8  HGB 7.1* 7.4* 7.8* 7.1* 8.0*  HCT 22.3* 22.1* 23.2* 21.6* 23.8*  MCV 85.1 84.4 84.7 84.7 82.9  PLT 175 188 214 211 216    Cardiac Enzymes: No results for input(s): "CKTOTAL", "CKMB", "CKMBINDEX", "TROPONINI" in the last 168 hours. BNP (last 3 results) No results for input(s): "PROBNP" in the last 8760 hours. CBG: Recent Labs  Lab 11/03/21 1206 11/03/21 1700 11/03/21 2108 11/04/21 0008 11/04/21 0417  GLUCAP 151* 172* 132* 150* 213*    D-Dimer: No results for input(s): "DDIMER" in the last 72 hours. Hgb A1c: No results for input(s): "HGBA1C" in the last 72 hours. Lipid Profile: No results for input(s): "CHOL", "HDL", "LDLCALC", "TRIG", "CHOLHDL", "LDLDIRECT" in the last 72 hours.  Thyroid function studies: No results for input(s): "TSH", "T4TOTAL", "T3FREE", "THYROIDAB" in the last 72 hours.  Invalid input(s): "FREET3" Anemia work up: No results for input(s): "VITAMINB12", "FOLATE", "FERRITIN", "TIBC", "IRON", "RETICCTPCT" in the last 72 hours.  Sepsis Labs: Recent Labs  Lab 11/01/21 0459 11/02/21 0500 11/03/21 0554 11/04/21 0623  WBC 6.6 7.7 6.6 6.8    Microbiology No results found for  this or any previous visit (from the past 240 hour(s)).   Medications:    sodium chloride   Intravenous Once   aspirin EC  81 mg Oral Daily   carvedilol  12.5 mg Oral BID WC   Chlorhexidine Gluconate Cloth  6 each Topical Daily   citalopram  20 mg Oral BID   febuxostat  80 mg Oral q morning   fenofibrate  160 mg Oral Daily   icosapent Ethyl  2 g Oral Daily   insulin aspart  0-15 Units Subcutaneous TID WC   insulin aspart  0-5 Units Subcutaneous QHS   insulin aspart  3 Units Subcutaneous TID WC   insulin detemir  20 Units Subcutaneous BID   isosorbide mononitrate  120 mg Oral Daily   omeprazole  20 mg Oral Daily   prasugrel  10 mg Oral Daily   ranolazine  500 mg Oral Daily   rosuvastatin  5 mg Oral Daily   senna-docusate  1 tablet Oral BID   Continuous Infusions:      LOS: 10 days   Charlynne Cousins  [facility]  11/04/2021, 8:51 AM

## 2021-11-04 NOTE — Progress Notes (Signed)
Advanced Heart Failure Rounding Note  PCP-Cardiologist: None   Subjective:     Creatinine 2.1>2.8>4.19>4.9> 5.22>4.8>4.5>4.6  Nephrololgy following. Yesterday diuresed with 120 mg IV lasix x2.     No weight X 3  days.   Adamant she wants to go home.     Objective:   Weight Range: (!) 149.8 kg Body mass index is 51.72 kg/m.   Vital Signs:   Temp:  [97.5 F (36.4 C)-98.4 F (36.9 C)] 98.4 F (36.9 C) (08/15 1037) Pulse Rate:  [68-82] 82 (08/15 1037) Resp:  [12-20] 16 (08/15 1037) BP: (98-142)/(47-84) 98/56 (08/15 1037) SpO2:  [93 %-100 %] 96 % (08/15 1037) Last BM Date : 11/03/21 (per pt)  Weight change: Filed Weights   10/30/21 0625 10/31/21 0419 11/01/21 0500  Weight: (!) 150.1 kg (!) 150.4 kg (!) 149.8 kg    Intake/Output:   Intake/Output Summary (Last 24 hours) at 11/04/2021 1129 Last data filed at 11/04/2021 1000 Gross per 24 hour  Intake 1022 ml  Output 3301 ml  Net -2279 ml      Physical Exam  CVP 13 per nuring staff. She is not hooked up to CVP.  General:  Sitting in the chair. No resp difficulty HEENT: normal Neck: supple. JVP difficult to assess due to body habitus.  Carotids 2+ bilat; no bruits. No lymphadenopathy or thryomegaly appreciated. Cor: PMI nondisplaced. Regular rate & rhythm. No rubs, gallops or murmurs. Lungs: clear Abdomen: soft, nontender, nondistended. No hepatosplenomegaly. No bruits or masses. Good bowel sounds. Extremities: no cyanosis, clubbing, rash, R and LLE trace edema. [name] PICC  Neuro: alert & orientedx3, cranial nerves grossly intact. moves all 4 extremities w/o difficulty. Affect pleasant    Telemetry   SR 80s personally checked.   Labs    CBC Recent Labs    11/03/21 0554 11/04/21 0623  WBC 6.6 6.8  HGB 7.1* 8.0*  HCT 21.6* 23.8*  MCV 84.7 82.9  PLT 211 854   Basic Metabolic Panel Recent Labs    11/03/21 0554 11/04/21 0623  NA 139 138  K 4.0 3.9  CL 105 102  CO2 26 28  GLUCOSE 183* 180*   BUN 76* 75*  CREATININE 4.46* 4.62*  CALCIUM 9.0 8.8*  PHOS 4.6 4.5   Liver Function Tests Recent Labs    11/03/21 0554 11/04/21 0623  ALBUMIN 2.8* 2.8*    No results for input(s): "LIPASE", "AMYLASE" in the last 72 hours. Cardiac Enzymes No results for input(s): "CKTOTAL", "CKMB", "CKMBINDEX", "TROPONINI" in the last 72 hours.  BNP: BNP (last 3 results) No results for input(s): "BNP" in the last 8760 hours.  ProBNP (last 3 results) No results for input(s): "PROBNP" in the last 8760 hours.   D-Dimer No results for input(s): "DDIMER" in the last 72 hours. Hemoglobin A1C No results for input(s): "HGBA1C" in the last 72 hours. Fasting Lipid Panel No results for input(s): "CHOL", "HDL", "LDLCALC", "TRIG", "CHOLHDL", "LDLDIRECT" in the last 72 hours.  Thyroid Function Tests No results for input(s): "TSH", "T4TOTAL", "T3FREE", "THYROIDAB" in the last 72 hours.  Invalid input(s): "FREET3"  Other results:   Imaging    No results found.   Medications:     Scheduled Medications:  sodium chloride   Intravenous Once   aspirin EC  81 mg Oral Daily   carvedilol  12.5 mg Oral BID WC   Chlorhexidine Gluconate Cloth  6 each Topical Daily   citalopram  20 mg Oral BID   febuxostat  80 mg  Oral q morning   fenofibrate  160 mg Oral Daily   icosapent Ethyl  2 g Oral Daily   insulin aspart  0-15 Units Subcutaneous TID WC   insulin aspart  0-5 Units Subcutaneous QHS   insulin aspart  3 Units Subcutaneous TID WC   insulin detemir  20 Units Subcutaneous BID   isosorbide mononitrate  120 mg Oral Daily   omeprazole  20 mg Oral Daily   prasugrel  10 mg Oral Daily   ranolazine  500 mg Oral Daily   rosuvastatin  5 mg Oral Daily   senna-docusate  1 tablet Oral BID    Infusions:     PRN Medications: acetaminophen, ondansetron **OR** ondansetron (ZOFRAN) IV, oxyCODONE, polyethylene glycol, traMADol, zolpidem    Patient Profile   56 y.o. female with history of CAD s/p  CABG followed by mulitple PCIs, chronic diastolic CHF, HLD, obesity, venous insufficiency, recurrent LE wounds, CKD III, HTN, OSA, anemia.    Admitted with NSTEMI, AKI on CKD, acute on chronic anemia and notable RLE wound.  Assessment/Plan   1. CAD:  Very aggressive coronary disease.  Had 4 more drug-eluting stents placed in 12/11, 1 in the SVG-LAD and 3 to open the totally occluded native RCA after occlusion of the SVG-PDA. She was deemed not to be a candidate for re-do CABG at that admission by cardiac surgery. Unfortunately, due to her body habitus, the surgeons were unable to mobilize her LIMA for grafting during her initial CABG operation. Lexiscan Cardiolite in 3/14 showed no ischemia or infarction though study was limited by body habitus.  NSTEMI in 7/20, LHC showed occluded SVG-RCA and native RCA, occluded mLAD, 80% in-stent restenosis in the SVG-LAD, 95% mLCx stenosis.  DES to SVG-LAD, LCx thought to be too small to intervene upon.  NSTEMI 11/21, occluded SVG-LAD treated with PTCA.  NSTEMI this admission with HS-TnI 1711 => 1943.  Cath this admit with chronic occlusion of RCA and SVG-RCA, 70% ostial LAD, occluded mid LAD, patent SVG-LAD, patent SVG-D, stable 80% mLCx and 70% OM1 stenoses (diffuse disease in LCx system).  No target for intervention. Possible rise in TnI due to demand ischemia with anemia and volume overload in the setting of chronic coronary diffuse coronary disease.  - No chest pain.  - Has been on Effient long-term since she is a poor Plavix responder and has multiple DES.   - Continue ASA 81. - Continue Crestor.    - Continue Coreg, Imdur, and ranolazine. Continue lower dose of coreg and Ranexa  in setting of elevated creatinine.   ? Need to stop Ranexa. Will d/w PharmD  2. Acute on chronic diastolic CHF: NYHA class III symptoms.  Torsemide recently decreased from 60 bid to 20 bid, unsure why.  This may have triggered volume overload/CHF.  Echo this admission with EF 55-60%,  moderate LVH, normal RV.  LVEDP 33 on LHC.   - Volume status improved. She is adamant she wants to go home. Today. Think she could go back on torsemide. If ok with nephrology would place on torsemide 60 mg twice a day.  - Worsening renal function, now starting to improve. Nephrology following. - Wound not use SGLT2-inhibitor candidate given body habitus (risk of UTIs/yeast infections).  3. Hyperlipidemia: She is on Vascepa, Crestor, and fenofibrate.  4. Obesity: Ideally would start semaglutide or dulaglutide.  5.Venous insufficiency:  ABIs normal last check. Venous stasis ulcer right foot followed by wound care.  MRI this admission did not show osteomyelitis.  -  ABIs: noncompressible R/L 6. AKI on CKD: Stage 3/Possible contrast nephropathy.  Creatinine appears to run 2.1 - 2.2 baseline.  Was down to 1.9 but now up to 5.2  did have contrast with LHC.  -Renal US- no hydronephrosis.  - Creatinine continues to rise 2.8 >4.19 >4.9 > 5.2 > 4.8 > 4.5>4.6 - Nephrology following. -  Hopeful for renal recovery. Avoid nephrotoxins 7. OSA: Needs CPAP.  8. Anemia: Hgb 7.1 Had 1 u RBCs --->8 today  - Fe studies c/w IDA. B12 and folate normal.  - Treated w/ feraheme  - FOBT pending  - GI consulted. S/P EGD/ colonoscopy--esophagitis.  - PPI  She is adamant she wants to go home. Will need PICC  out. We will place follow up.    Length of Stay: [place], NP  11/04/2021, 11:29 AM  Advanced Heart Failure Team Pager [phone] (M-F; 7a - 5p)  Please contact [facility] for night-coverage after hours (5p -7a ) and weekends on [email or web address]  Patient seen with NP, agree with the above note.   CVP line not hooked up to PICC but apparently CVP was 13 earlier today.  Daily weights have not been completed since 8/12.  Creatinine mildly higher at 4.62 today. Hgb up to 8.   Denies dyspnea.   General: NAD Neck: JVP 14 cm, no thyromegaly or thyroid nodule.  Lungs: Clear to auscultation bilaterally with  normal respiratory effort. CV: Nondisplaced PMI.  Heart regular S1/S2, no S3/S4, no murmur.  Chronic edema to knees.  Abdomen: Soft, nontender, no hepatosplenomegaly, no distention.  Skin: Intact without lesions or rashes.  Neurologic: Alert and oriented x 3.  Psych: Normal affect. Extremities: No clubbing or cyanosis.  HEENT: Normal.   Hopefully creatinine has plateaued.  Still volume overloaded on exam but good UOP yesterday.  - Agree that we can transition to torsemide 60 mg bid (will give 80 mg x 1 today).  - Ideally would watch her overnight to make sure creatinine remains stable on torsemide, but she is fairly insistent on discharge.  - If goes home today, needs BMET Monday and close CHF clinic followup.  - Will see if it is possible for [place] to get a weight on her today.   Hgb with appropriate increase with transfusion yesterday.   Loralie Champagne 11/04/2021 12:57 PM

## 2021-11-04 NOTE — Progress Notes (Signed)
[facility] Progress Note   Assessment/ Plan:   AKI/CKD stage IV - likely combination of contrast-induced nephropathy as well as acute on chronic diastolic CHF and cardiorenal syndrome.  UOP has dropped off over the last 24 hours since holding diuretics.  She will need to resume IV lasix and follow her response.  Urine studies consistent with pre-renal (FeNa was 0.59%) and renal US unremarkable.  THis is most consistent with cardiorenal syndrome.  Avoid nephrotoxic medications including NSAIDs and iodinated intravenous contrast exposure unless the latter is absolutely indicated.   Preferred narcotic agents for pain control are hydromorphone, fentanyl, and methadone. Morphine should not be used.  Avoid Baclofen and avoid oral sodium phosphate and magnesium citrate based laxatives / bowel preps.  Continue strict Input and Output monitoring.  Will monitor the patient closely with you and intervene or adjust therapy as indicated by changes in clinical status/labs  Appears Cr has plateaued Transitioned to oral torsemide Ok from renal perspective to d/c at any time- discussed with pt that she will need labs and followup with me at [place]- she is agreeable NSTEMI - s/p cath without acute change of underlying CAD anatomy. Acute on chronic diastolic CHF - as above.  Restarted IV lasix and follow. Anemia of CKD stage IV - transfuse for Hgb <7.  ESA on hold due to CHF exacerbation. Esophagitis/gastritis - on protonix. DM type 2 - per primary OSA on CPAP Morbid obesity  Subjective:    Transitioned to oral torsemide, Cr plateauing   Objective:   BP (!) 128/59 (BP Location: Right Arm)   Pulse 77   Temp 98 F (36.7 C) (Oral)   Resp 18   Ht '5\' 7"'$  (1.702 m)   Wt (!) 143.3 kg   SpO2 100%   BMI 49.48 kg/m   Physical Exam: Gen:NAD, washing up at sink CVS:RRR Resp: clear Abd: + pannus Ext: 2+ LE edema with cobblestoning  Labs: BMET Recent Labs  Lab 10/29/21 0500 10/30/21 0500  10/31/21 0640 11/01/21 0459 11/02/21 0500 11/03/21 0554 11/04/21 0623  NA 138 134* 133* 135 140 139 138  K 4.1 4.3 4.3 3.9 4.0 4.0 3.9  CL 103 101 100 103 103 105 102  CO2 '26 25 24 24 25 26 28  '$ GLUCOSE 169* 191* 205* 181* 187* 183* 180*  BUN 33* 46* 62* 73* 75* 76* 75*  CREATININE 2.82* 4.19* 4.92* 5.22* 4.82* 4.46* 4.62*  CALCIUM 9.3 9.1 9.2 9.0 9.3 9.0 8.8*  PHOS  --   --   --   --  5.1* 4.6 4.5   CBC Recent Labs  Lab 11/01/21 0459 11/02/21 0500 11/03/21 0554 11/04/21 0623  WBC 6.6 7.7 6.6 6.8  HGB 7.4* 7.8* 7.1* 8.0*  HCT 22.1* 23.2* 21.6* 23.8*  MCV 84.4 84.7 84.7 82.9  PLT 188 214 211 216      Medications:     sodium chloride   Intravenous Once   aspirin EC  81 mg Oral Daily   carvedilol  12.5 mg Oral BID WC   Chlorhexidine Gluconate Cloth  6 each Topical Daily   citalopram  20 mg Oral BID   febuxostat  80 mg Oral q morning   fenofibrate  160 mg Oral Daily   icosapent Ethyl  2 g Oral Daily   insulin aspart  0-15 Units Subcutaneous TID WC   insulin aspart  0-5 Units Subcutaneous QHS   insulin aspart  3 Units Subcutaneous TID WC   insulin detemir  20 Units  Subcutaneous BID   isosorbide mononitrate  120 mg Oral Daily   omeprazole  20 mg Oral Daily   prasugrel  10 mg Oral Daily   ranolazine  500 mg Oral Daily   rosuvastatin  5 mg Oral Daily   senna-docusate  1 tablet Oral BID   [START ON 11/05/2021] torsemide  60 mg Oral BID     [name], MD 11/04/2021, 2:45 PM

## 2021-11-05 ENCOUNTER — Other Ambulatory Visit (HOSPITAL_COMMUNITY): Payer: Self-pay

## 2021-11-05 ENCOUNTER — Ambulatory Visit: Payer: Medicare Other | Admitting: Internal Medicine

## 2021-11-05 DIAGNOSIS — I5033 Acute on chronic diastolic (congestive) heart failure: Secondary | ICD-10-CM | POA: Diagnosis not present

## 2021-11-05 LAB — GLUCOSE, CAPILLARY
Glucose-Capillary: 205 mg/dL — ABNORMAL HIGH (ref 70–99)
Glucose-Capillary: 183 mg/dL — ABNORMAL HIGH (ref 70–99)
Glucose-Capillary: 180 mg/dL — ABNORMAL HIGH (ref 70–99)

## 2021-11-05 LAB — RENAL FUNCTION PANEL
Albumin: 3 g/dL — ABNORMAL LOW (ref 3.5–5.0)
Anion gap: 8 (ref 5–15)
BUN: 72 mg/dL — ABNORMAL HIGH (ref 6–20)
CO2: 29 mmol/L (ref 22–32)
Calcium: 9.2 mg/dL (ref 8.9–10.3)
Chloride: 101 mmol/L (ref 98–111)
Creatinine, Ser: 4.1 mg/dL — ABNORMAL HIGH (ref 0.44–1.00)
GFR, Estimated: 12 mL/min — ABNORMAL LOW (ref >60)
Glucose, Bld: 196 mg/dL — ABNORMAL HIGH (ref 70–99)
Phosphorus: 3.8 mg/dL (ref 2.5–4.6)
Potassium: 3.8 mmol/L (ref 3.5–5.1)
Sodium: 138 mmol/L (ref 135–145)

## 2021-11-05 MED ORDER — TORSEMIDE 20 MG PO TABS
60.0000 mg | ORAL_TABLET | Freq: Two times a day (BID) | ORAL | 0 refills | Status: DC
  Filled 2021-11-05: qty 200, 33d supply, fill #0

## 2021-11-05 MED ORDER — GABAPENTIN 100 MG PO CAPS: 300.0000 mg | ORAL_CAPSULE | Freq: Every day | ORAL | Status: DC

## 2021-11-05 MED ORDER — CARVEDILOL 25 MG PO TABS: 12.5000 mg | ORAL_TABLET | Freq: Two times a day (BID) | ORAL | Status: DC

## 2021-11-05 MED ORDER — INSULIN GLARGINE 100 UNIT/ML ~~LOC~~ SOLN: 35.0000 [IU] | Freq: Two times a day (BID) | SUBCUTANEOUS | Status: DC

## 2021-11-05 MED ORDER — INSULIN ASPART 100 UNIT/ML IJ SOLN
4.0000 [IU] | Freq: Three times a day (TID) | INTRAMUSCULAR | Status: DC
  Administered 2021-11-05: 4 [IU] via SUBCUTANEOUS

## 2021-11-05 NOTE — Progress Notes (Signed)
PT Cancellation Note  Patient Details Name: [name] MRN: [id number] DOB: [birth year]   Cancelled Treatment:    Reason Eval/Treat Not Completed: Other (comment) pt declining all mobility, stating she was leaving today, max encouragement for stair practice before d/c for safety going home, pt adamantly declining. Will check back as schedule allows to continue with PT POC.  Audry Riles. PTA Acute Rehabilitation Services Office: [place] 11/05/2021, 8:47 AM

## 2021-11-05 NOTE — Progress Notes (Addendum)
Advanced Heart Failure Rounding Note  PCP-Cardiologist: None   Subjective:     Creatinine 2.1>2.8>4.19>4.9> 5.22>4.8>4.5>4.6>4.1  Feels good. Wants to go home.  Objective:   Weight Range: (!) 142.7 kg Body mass index is 49.27 kg/m.   Vital Signs:   Temp:  [97.6 F (36.4 C)-98.4 F (36.9 C)] 98 F (36.7 C) (08/16 0804) Pulse Rate:  [73-82] 80 (08/16 0804) Resp:  [15-20] 16 (08/16 0804) BP: (98-145)/(56-60) 139/60 (08/16 0804) SpO2:  [95 %-100 %] 95 % (08/16 0804) Weight:  [142.7 kg-143.3 kg] 142.7 kg (08/16 0420) Last BM Date : 11/03/21  Weight change: Filed Weights   11/01/21 0500 11/04/21 1318 11/05/21 0420  Weight: (!) 149.8 kg (!) 143.3 kg (!) 142.7 kg    Intake/Output:   Intake/Output Summary (Last 24 hours) at 11/05/2021 1034 Last data filed at 11/05/2021 0948 Gross per 24 hour  Intake 1200 ml  Output 3550 ml  Net -2350 ml      Physical Exam  General:  Sitting in the chair. No resp difficulty HEENT: normal Neck: supple. JVP 7-8  Carotids 2+ bilat; no bruits. No lymphadenopathy or thryomegaly appreciated. Cor: PMI nondisplaced. Regular rate & rhythm. No rubs, gallops or murmurs. Lungs: clear Abdomen: soft, nontender, nondistended. No hepatosplenomegaly. No bruits or masses. Good bowel sounds. Extremities: no cyanosis, clubbing, rash, edema. [name] PICC Neuro: alert & orientedx3, cranial nerves grossly intact. moves all 4 extremities w/o difficulty. Affect pleasant    Telemetry   SR 80s personally checked.   Labs    CBC Recent Labs    11/03/21 0554 11/04/21 0623  WBC 6.6 6.8  HGB 7.1* 8.0*  HCT 21.6* 23.8*  MCV 84.7 82.9  PLT 211 329   Basic Metabolic Panel Recent Labs    11/04/21 0623 11/05/21 0345  NA 138 138  K 3.9 3.8  CL 102 101  CO2 28 29  GLUCOSE 180* 196*  BUN 75* 72*  CREATININE 4.62* 4.10*  CALCIUM 8.8* 9.2  PHOS 4.5 3.8   Liver Function Tests Recent Labs    11/04/21 0623 11/05/21 0345  ALBUMIN 2.8* 3.0*     No results for input(s): "LIPASE", "AMYLASE" in the last 72 hours. Cardiac Enzymes No results for input(s): "CKTOTAL", "CKMB", "CKMBINDEX", "TROPONINI" in the last 72 hours.  BNP: BNP (last 3 results) No results for input(s): "BNP" in the last 8760 hours.  ProBNP (last 3 results) No results for input(s): "PROBNP" in the last 8760 hours.   D-Dimer No results for input(s): "DDIMER" in the last 72 hours. Hemoglobin A1C No results for input(s): "HGBA1C" in the last 72 hours. Fasting Lipid Panel No results for input(s): "CHOL", "HDL", "LDLCALC", "TRIG", "CHOLHDL", "LDLDIRECT" in the last 72 hours.  Thyroid Function Tests No results for input(s): "TSH", "T4TOTAL", "T3FREE", "THYROIDAB" in the last 72 hours.  Invalid input(s): "FREET3"  Other results:   Imaging    No results found.   Medications:     Scheduled Medications:  sodium chloride   Intravenous Once   aspirin EC  81 mg Oral Daily   carvedilol  12.5 mg Oral BID WC   Chlorhexidine Gluconate Cloth  6 each Topical Daily   citalopram  20 mg Oral BID   febuxostat  80 mg Oral q morning   fenofibrate  160 mg Oral Daily   icosapent Ethyl  2 g Oral Daily   insulin aspart  0-15 Units Subcutaneous TID WC   insulin aspart  0-5 Units Subcutaneous QHS   insulin  aspart  4 Units Subcutaneous TID WC   insulin detemir  20 Units Subcutaneous BID   isosorbide mononitrate  120 mg Oral Daily   omeprazole  20 mg Oral Daily   prasugrel  10 mg Oral Daily   ranolazine  500 mg Oral Daily   rosuvastatin  5 mg Oral Daily   senna-docusate  1 tablet Oral BID   torsemide  60 mg Oral BID    Infusions:     PRN Medications: acetaminophen, ondansetron **OR** ondansetron (ZOFRAN) IV, oxyCODONE, polyethylene glycol, traMADol, zolpidem    Patient Profile   56 y.o. female with history of CAD s/p CABG followed by mulitple PCIs, chronic diastolic CHF, HLD, obesity, venous insufficiency, recurrent LE wounds, CKD III, HTN, OSA,  anemia.    Admitted with NSTEMI, AKI on CKD, acute on chronic anemia and notable RLE wound.  Assessment/Plan   1. CAD:  Very aggressive coronary disease.  Had 4 more drug-eluting stents placed in 12/11, 1 in the SVG-LAD and 3 to open the totally occluded native RCA after occlusion of the SVG-PDA. She was deemed not to be a candidate for re-do CABG at that admission by cardiac surgery. Unfortunately, due to her body habitus, the surgeons were unable to mobilize her LIMA for grafting during her initial CABG operation. Lexiscan Cardiolite in 3/14 showed no ischemia or infarction though study was limited by body habitus.  NSTEMI in 7/20, LHC showed occluded SVG-RCA and native RCA, occluded mLAD, 80% in-stent restenosis in the SVG-LAD, 95% mLCx stenosis.  DES to SVG-LAD, LCx thought to be too small to intervene upon.  NSTEMI 11/21, occluded SVG-LAD treated with PTCA.  NSTEMI this admission with HS-TnI 1711 => 1943.  Cath this admit with chronic occlusion of RCA and SVG-RCA, 70% ostial LAD, occluded mid LAD, patent SVG-LAD, patent SVG-D, stable 80% mLCx and 70% OM1 stenoses (diffuse disease in LCx system).  No target for intervention. Possible rise in TnI due to demand ischemia with anemia and volume overload in the setting of chronic coronary diffuse coronary disease.  - No chest pain.  - Has been on Effient long-term since she is a poor Plavix responder and has multiple DES.   - Continue ASA 81. - Continue Crestor.    - Continue Coreg, Imdur, and ranolazine. Continue lower dose of coreg and Ranexa  in setting of elevated creatinine.   ? Need to stop Ranexa. Will d/w PharmD  2. Acute on chronic diastolic CHF: NYHA class III symptoms.  Torsemide recently decreased from 60 bid to 20 bid, unsure why.  This may have triggered volume overload/CHF.  Echo this admission with EF 55-60%, moderate LVH, normal RV.  LVEDP 33 on LHC.   - Volume status stable. Continue torsemide 60 mg twice a day. - Worsening renal  function, now starting to improve. Nephrology following. - Wound not use SGLT2-inhibitor candidate given body habitus (risk of UTIs/yeast infections).  3. Hyperlipidemia: She is on Vascepa, Crestor, and fenofibrate.  4. Obesity: Ideally would start semaglutide or dulaglutide.  5.Venous insufficiency:  ABIs normal last check. Venous stasis ulcer right foot followed by wound care.  MRI this admission did not show osteomyelitis.  - ABIs: noncompressible R/L 6. AKI on CKD: Stage 3/Possible contrast nephropathy.  Creatinine appears to run 2.1 - 2.2 baseline.  Was down to 1.9 but now up to 5.2  did have contrast with LHC.  -Renal US- no hydronephrosis.  - Creatinine continues to rise 2.8 >4.19 >4.9 > 5.2 > 4.8 > 4.5>4.6>4.1 -  Nephrology following. -  Hopeful for renal recovery. Avoid nephrotoxins 7. OSA: Needs CPAP.  8. Anemia: Hgb 7.1 Had 1 u RBCs --->8 - Fe studies c/w IDA. B12 and folate normal.  - Treated w/ feraheme  - FOBT pending  - GI consulted. S/P EGD/ colonoscopy--esophagitis.  - PPI  Ok to d/c  Torsemide 60 mg twice a day  ASA 81 mg daily  Ranex 500 mg twice a day Imdur 120 mg daily   Crestor 5 mg daily Coreg 12.5 mg bid Vascepa 2 g bid Imdur 120 daily  HF follow up arranged.     Length of Stay: [place], NP  11/05/2021, 10:34 AM  Advanced Heart Failure Team Pager [phone] (M-F; 7a - 5p)  Please contact [facility] for night-coverage after hours (5p -7a ) and weekends on [email or web address]  Agree with the above NP note.   Torsemide restarted yesterday, creatinine trending down to 4.1.  I/Os net negative.    Wants to go home.   General: NAD Neck: Thick, JVP 10 cm, no thyromegaly or thyroid nodule.  Lungs: Clear to auscultation bilaterally with normal respiratory effort. CV: Nondisplaced PMI.  Heart regular S1/S2, no S3/S4, no murmur.  Chronic edema to knees.  Abdomen: Soft, nontender, no hepatosplenomegaly, no distention.  Skin: Intact without lesions or  rashes.  Neurologic: Alert and oriented x 3.  Psych: Normal affect. Extremities: No clubbing or cyanosis.  HEENT: Normal.   Still mild volume overload, but think we can let this gradually improve over time with po torsemide.  Creatinine trending down.  I think she can go home on the above medication regimen with close followup in CHF clinic.  Needs to followup with nephrology as well.   Loralie Champagne 11/05/2021

## 2021-11-05 NOTE — Discharge Summary (Signed)
Physician Discharge Summary  [name] BOF:[id number] DOB: 04-Apr-1965 DOA: 10/25/2021  PCP: Jacklynn Ganong, MD  Admit date: 10/25/2021 Discharge date: 11/05/2021  Time spent: 35  minutes  Recommendations for Outpatient Follow-up:  Cardiology Dr. Aundra Dubin in 1 to 2 weeks, please check BMP at follow-up Nephrology Dr. Hollie Salk, check BMP in 1 week Home health PT OT RN Gastroenterology, please follow-up biopsies  Discharge Diagnoses:  Principal Problem:   Acute on chronic heart failure with preserved ejection fraction (HFpEF) ([place]) Extensive CAD AKI on CKD 4 Iron deficiency anemia   NSTEMI (non-ST elevated myocardial infarction) ([place])   CKD (chronic kidney disease), stage IV ([place])   Hypertension associated with diabetes ([place])   Wound of right foot   Insulin dependent type 2 diabetes mellitus ([place])   Hyperlipidemia associated with type 2 diabetes mellitus ([place])   Atherosclerosis of coronary artery bypass graft   OSA (obstructive sleep apnea)   Anemia of chronic disease   RVF (right ventricular failure) ([place])   Cardiorenal syndrome   Discharge Condition: Improved  Diet recommendation: DM, heart healthy  Filed Weights   11/01/21 0500 11/04/21 1318 11/05/21 0420  Weight: (!) 149.8 kg (!) 143.3 kg (!) 142.7 kg    History of present illness:  56/F with history of CAD, CABG, multiple PCI's, chronic diastolic CHF, chronic venous insufficiency and lower extremity wounds, PAD, CKD 3, OSA and anemia was admitted with NSTEMI, AKI on CKD 3, acute on chronic anemia  Hospital Course:   NSTEMI, extensive CAD -Prior CABG and multiple stents -High-sensitivity troponin peaked at 1943, left heart cath noted chronic RCA occlusion and SVG-RCA, 70% ostial LAD, occluded mid LAD, patent SVG-LAD, patent SVG/D, stable 80% mid circumflex and 70% OM1 stenosis, no target for intervention -Continued on aspirin, Effient which she has been on long-term, Coreg, Imdur, Crestor, Ranexa -Follow-up with  cardiology  Acute on chronic diastolic CHF:  -Admitted with dyspnea, orthopnea, chest x-ray noted pulmonary edema  -Echo with a EF 55-60%, moderate LVH, normal RV  -Diuresed with IV Lasix, diuretics briefly held in the setting of worsening AKI  -Volume status improved, transitioned to torsemide 60 Mg twice daily  -GDMT limited by CKD 4   AKI on CKD 4 -Creatinine peaked at 5.7, secondary to cardiorenal syndrome and contrast for left heart cath, baseline creatinine was around 2.3, followed by nephrology this admission, renal ultrasound was negative for hydronephrosis -Creatinine now improving, 4.1 at discharge -Nephrology recommended to continue current dose of torsemide and follow-up in clinic  Dyslipidemia  -on Vascepa, Crestor, and fenofibrate.   Chronic venous insufficiency  -ABIs normal last check. Venous stasis ulcer right foot followed by wound care.  MRI this admission did not show osteomyelitis.  - ABIs: noncompressible R/L, patient reports that she is followed by vascular surgery, she is advised to follow-up with [facility] -Continue statin, dual antiplatelet therapy  Anemia of chronic disease and iron deficiency -Iron studies with iron deficiency anemia as well, treated with IV iron  -Seen by gastroenterology this admission, transfuse 1 unit of PRBC, colonoscopy noted 2 polyps which were removed and EGD noted esophagitis, continue PPI   OSA -Continue CPAP    Procedures: Left heart cath : chronic occlusion of RCA and SVG-RCA, 70% ostial LAD, occluded mid LAD, patent SVG-LAD, patent SVG-D, stable 80% mLCx and 70% OM1 stenoses (diffuse disease in LCx system).  No target for intervention  Colonoscopy-showed 2 polyps which were removed diverticulosis no evidence of bleeding, some internal hemorrhoids. EGD 8/8 noted esophagitis, follow-up  with gastroenterology  Consultations: Gastroenterology, cardiology  Discharge Exam: Vitals:   11/05/21 0420 11/05/21 0804  BP:  139/60   Pulse:  80  Resp: 15 16  Temp:  98 F (36.7 C)  SpO2:  95%   Gen: Awake, Alert, Oriented X 3,  HEENT: no JVD Lungs: Good air movement bilaterally, CTAB CVS: S1S2/RRR Abd: soft, Non tender, non distended, BS present Extremities: Chronic ischemic changes in both legs Skin: no new rashes on exposed skin   Discharge Instructions   Discharge Instructions     Diet - low sodium heart healthy   Complete by: As directed    Discharge wound care:   Complete by: As directed    routine   Increase activity slowly   Complete by: As directed       Allergies as of 11/05/2021       Reactions   Diphenhydramine-acetaminophen Hives   Morphine Sulfate Other (See Comments)   SEVERE SOMNOLENCE  States just Morphine in pill form   Cephalosporins Anxiety   Bactrim [sulfamethoxazole-trimethoprim] Other (See Comments)   Skin peeled off   Diazepam Hives   Valium   Insulins    humulin   Latex Itching   Simvastatin Other (See Comments)   Sore Joints   Tylenol Pm Extra [diphenhydramine-apap (sleep)] Hives   Ciprofloxacin Anxiety   Levofloxacin Anxiety        Medication List     TAKE these medications    albuterol 108 (90 Base) MCG/ACT inhaler Commonly known as: VENTOLIN HFA Inhale 2 puffs into the lungs every 6 (six) hours as needed for wheezing or shortness of breath.   aspirin EC 81 MG tablet Take 81 mg by mouth daily.   Bayer Contour Test test strip Generic drug: glucose blood   carvedilol 25 MG tablet Commonly known as: COREG Take 0.5 tablets (12.5 mg total) by mouth 2 (two) times daily with a meal. What changed: See the new instructions.   Cholecalciferol 25 MCG (1000 UT) tablet Take 1,000 Units by mouth daily.   citalopram 20 MG tablet Commonly known as: CELEXA Take 20 mg by mouth 2 (two) times a day.   Coenzyme Q10 200 MG Tabs Take 200 mg by mouth daily.   diphenoxylate-atropine 2.5-0.025 MG tablet Commonly known as: LOMOTIL Take 1-2 tablets by mouth 4  (four) times daily as needed for diarrhea or loose stools.   Effient 10 MG Tabs tablet Generic drug: prasugrel Take 1 tablet (10 mg total) by mouth daily. NEEDS FOLLOW UP APPOINTMENT FOR ANYMORE REFILLS   Febuxostat 80 MG Tabs Commonly known as: Uloric Take 1 tablet (80 mg total) by mouth every morning. NEEDS FOLLOW UP APPOINTMENT FOR ANYMORE REFILLS   fenofibrate 160 MG tablet Take 160 mg by mouth daily.   gabapentin 100 MG capsule Commonly known as: NEURONTIN Take 3 capsules (300 mg total) by mouth at bedtime. 300 mg  ( 3 capsules)  at bedtime What changed:  how much to take when to take this additional instructions   icosapent Ethyl 1 g capsule Commonly known as: VASCEPA Take 2 capsules (2 g total) by mouth 2 (two) times daily. NEEDS FOLLOW UP APPOINTMENT FOR ANYMORE REFILLS   insulin aspart 100 UNIT/ML injection Commonly known as: novoLOG Inject 11-12 Units into the skin See admin instructions.  12 units at breakfast and lunch and 11 units in the evening if needed per sliding scale, not provided.   insulin glargine 100 UNIT/ML injection Commonly known as: LANTUS Inject 0.35 mLs (  35 Units total) into the skin 2 (two) times daily. What changed: how much to take   isosorbide mononitrate 120 MG 24 hr tablet Commonly known as: IMDUR Take 1 tablet (120 mg total) by mouth every morning. NEEDS FOLLOW UP APPOINTMENT FOR ANYMORE REFILLS   meclizine 25 MG tablet Commonly known as: ANTIVERT Take 25 mg by mouth as needed for dizziness or nausea.   nitroGLYCERIN 0.4 MG SL tablet Commonly known as: Nitrostat Place 1 tablet (0.4 mg total) under the tongue every 5 (five) minutes as needed for chest pain.   NovoFine 32G X 6 MM Misc Generic drug: Insulin Pen Needle as directed.   Oxycodone HCl 10 MG Tabs Take 10 mg by mouth every 6 (six) hours as needed for pain.   pantoprazole 40 MG tablet Commonly known as: PROTONIX Take 40 mg by mouth daily as needed (acid reflux).    promethazine 25 MG tablet Commonly known as: PHENERGAN Take 25 mg by mouth every 6 (six) hours as needed for nausea or vomiting.   ranolazine 500 MG 12 hr tablet Commonly known as: RANEXA Take 1 tablet (500 mg total) by mouth 2 (two) times daily. NEEDS FOLLOW UP APPOINTMENT FOR ANYMORE REFILLS   rosuvastatin 5 MG tablet Commonly known as: Crestor TAKE ONE TABLET BY MOUTH ONCE DAILY FOR CHOLESTEROL What changed:  how much to take how to take this when to take this additional instructions   tiZANidine 4 MG tablet Commonly known as: ZANAFLEX Take 4 mg by mouth every 8 (eight) hours as needed for muscle spasms.   torsemide 20 MG tablet Commonly known as: DEMADEX Take 3 tablets (60 mg total) by mouth 2 (two) times daily. NEEDS FOLLOW UP APPOINTMENT FOR MORE REFILLS What changed: how much to take               Durable Medical Equipment  (From admission, onward)           Start     Ordered   11/05/21 1104  For home use only DME 4 wheeled rolling walker with seat  Once       Question:  Patient needs a walker to treat with the following condition  Answer:  Gait disorder   11/05/21 1104              Discharge Care Instructions  (From admission, onward)           Start     Ordered   11/05/21 0000  Discharge wound care:       Comments: routine   11/05/21 1109           Allergies  Allergen Reactions   Diphenhydramine-Acetaminophen Hives   Morphine Sulfate Other (See Comments)    SEVERE SOMNOLENCE  States just Morphine in pill form   Cephalosporins Anxiety   Bactrim [Sulfamethoxazole-Trimethoprim] Other (See Comments)    Skin peeled off   Diazepam Hives    Valium    Insulins     humulin   Latex Itching   Simvastatin Other (See Comments)    Sore Joints   Tylenol Pm Extra [Diphenhydramine-Apap (Sleep)] Hives   Ciprofloxacin Anxiety   Levofloxacin Anxiety    Follow-up Information     [facility]  Follow up on 11/14/2021.   Specialty: Cardiology Why: at 9:20 Bring all medications. Contact information: [address] [id number] mc [address] [place]        [name], MD Follow up in  1 week(s).   Specialty: Nephrology Contact information: [place] [address] [phone]                  The results of significant diagnostics from this hospitalization (including imaging, microbiology, ancillary and laboratory) are listed below for reference.    Significant Diagnostic Studies: US RENAL  Result Date: 10/30/2021 CLINICAL DATA:  Acute kidney injury EXAM: RENAL / URINARY TRACT ULTRASOUND COMPLETE COMPARISON:  None Available. FINDINGS: Right Kidney: Renal measurements: 12 x 5 x 6 cm = volume: 180 mL. Echogenicity within normal limits. No mass or hydronephrosis visualized. Left Kidney: Renal measurements: 10 x 6 x 4 cm = volume: 130 mL. Echogenicity within normal limits. No mass or hydronephrosis visualized. The kidney is likely underestimated due to difficult visualization from overlapping shadowing structures. No visible cortex thinning or other cause for asymmetric renal size. Bladder: Not visualized Other: Incidental note of numerous gallstones. IMPRESSION: 1. No hydronephrosis. 2. Difficult visualization of the left kidney with size likely underestimated. No suspected renal atrophy. 3. Incidental cholelithiasis. Electronically Signed   By: Jorje Guild M.D.   On: 10/30/2021 14:48   VAS [place] ABI WITH/WO TBI  Result Date: 10/28/2021  LOWER EXTREMITY DOPPLER STUDY Patient Name:  DANI WALLNER  Date of Exam:   10/28/2021 Medical Rec #: [id number]       Accession #:    [id number] Date of Birth: 24-Jun-1965        Patient Gender: F Patient Age:   47 years Exam Location:  [facility] Procedure:      VAS [place] ABI WITH/WO TBI Referring Phys: Collier Salina [place] --------------------------------------------------------------------------------   Indications: Venous stasis ulceration, non-healing wounds. Other Factors: S/P Right great toe amputation.  Limitations: Today's exam was limited due to skin changes and body habitus. Comparison Study: 06-11-2021 Prior arterial duplex lower extremity performed at                   outside facility showed no eidence of hemodynamically                   significant obstruction bilaterally                    01-17-2021 Prior ABI performed at outside facility Performing Technologist: Darlin Coco RDMS RVT  Examination Guidelines: A complete evaluation includes at minimum, Doppler waveform signals and systolic blood pressure reading at the level of bilateral brachial, anterior tibial, and posterior tibial arteries, when vessel segments are accessible. Bilateral testing is considered an integral part of a complete examination. Photoelectric Plethysmograph (PPG) waveforms and toe systolic pressure readings are included as required and additional duplex testing as needed. Limited examinations for reoccurring indications may be performed as noted.  ABI Findings: +---------+------------------+-----+--------+----------------------------------+ Right    Rt Pressure (mmHg)IndexWaveformComment                            +---------+------------------+-----+--------+----------------------------------+ Brachial                                PICC line- unable to  insonate/obtain pressure           +---------+------------------+-----+--------+----------------------------------+ PTA      255                    biphasic                                   +---------+------------------+-----+--------+----------------------------------+ DP       255                    biphasic                                   +---------+------------------+-----+--------+----------------------------------+ Great Toe                               Right toe amputation                +---------+------------------+-----+--------+----------------------------------+ +---------+------------------+-----+---------+---------------------------------+ Left     Lt Pressure (mmHg)IndexWaveform Comment                           +---------+------------------+-----+---------+---------------------------------+ Brachial                        triphasicExtra long cuff inflated to full                                           capacity; however, waveform                                                remained present                  +---------+------------------+-----+---------+---------------------------------+ PTA      255                             Unable to obtain signal secondary                                          to skin changes                   +---------+------------------+-----+---------+---------------------------------+ DP       255                    triphasic                                  +---------+------------------+-----+---------+---------------------------------+ Gardiner Rhyme                                                               +---------+------------------+-----+---------+---------------------------------+ Arterial wall  calcification precludes accurate ankle pressures and ABIs.  Summary: Right: Resting right ankle-brachial index indicates noncompressible right lower extremity arteries. Left: Resting left ankle-brachial index indicates noncompressible left lower extremity arteries. *See table(s) above for measurements and observations.  Electronically signed by Deitra Mayo MD on 10/28/2021 at 7:14:54 PM.    Final    [place] EKG SITE RITE  Result Date: 10/27/2021 If Site Rite image not attached, placement could not be confirmed due to current cardiac rhythm.  CARDIAC CATHETERIZATION  Result Date: 10/27/2021   Mid LM to Dist LM lesion is 35% stenosed.   Ost LAD lesion is 70% stenosed.   Mid LAD lesion is 100%  stenosed.   1st Mrg lesion is 70% stenosed.   Mid Cx lesion is 80% stenosed.   Prox RCA to Dist RCA lesion is 100% stenosed.   Origin to Mid Graft lesion is 35% stenosed.   Origin to Prox Graft lesion is 100% stenosed.   SVG graft was visualized by angiography and is small.   LV end diastolic pressure is moderately elevated. Severe 3 vessel obstructive CAD Patent SVG to LAD Patent SVG to diagonal Known occlusion of the SVG to RCA Elevated LVEDP 33 mm Hg Plan: SVG to LAD stents still patent. No significant change in diffuse LCx disease. I suspect NSTEMI is more demand ischemia with CHF and severe anemia   ECHOCARDIOGRAM COMPLETE  Result Date: 10/25/2021    ECHOCARDIOGRAM REPORT   Patient Name:   Aoife Bold Date of Exam: 10/25/2021 Medical Rec #:  [id number]      Height:       67.0 in Accession #:    [id number]     Weight:       317.7 lb Date of Birth:  1965-04-29       BSA:          2.462 m Patient Age:    27 years       BP:           119/53 mmHg Patient Gender: F              HR:           87 bpm. Exam Location:  Inpatient Procedure: 2D Echo, Cardiac Doppler, Color Doppler and Intracardiac            Opacification Agent Indications:    NSTEMI  History:        Patient has prior history of Echocardiogram examinations, most                 recent 10/02/2018. CAD, Prior CABG; Risk Factors:Diabetes,                 Hypertension, Dyslipidemia and Sleep Apnea. HFpEF. CKD. PAD.  Sonographer:    Clayton Lefort RDCS (AE) Referring Phys: Cleaster Corin PATEL IMPRESSIONS  1. Distal septal, apical hypokinesis with overall preserved LV function.  2. Left ventricular ejection fraction, by estimation, is 55 to 60%. The left ventricle has normal function. The left ventricle demonstrates regional wall motion abnormalities (see scoring diagram/findings for description). There is moderate concentric left ventricular hypertrophy. Left ventricular diastolic parameters are consistent with Grade II diastolic dysfunction (pseudonormalization).  Elevated left atrial pressure.  3. Right ventricular systolic function is normal. The right ventricular size is normal.  4. The mitral valve is normal in structure. No evidence of mitral valve regurgitation. No evidence of mitral stenosis.  5. The aortic valve is tricuspid. Aortic valve regurgitation is not visualized. No aortic  stenosis is present.  6. The inferior vena cava is dilated in size with >50% respiratory variability, suggesting right atrial pressure of 8 mmHg. FINDINGS  Left Ventricle: Left ventricular ejection fraction, by estimation, is 55 to 60%. The left ventricle has normal function. The left ventricle demonstrates regional wall motion abnormalities. Definity contrast agent was given IV to delineate the left ventricular endocardial borders. The left ventricular internal cavity size was normal in size. There is moderate concentric left ventricular hypertrophy. Left ventricular diastolic parameters are consistent with Grade II diastolic dysfunction (pseudonormalization). Elevated left atrial pressure. Right Ventricle: The right ventricular size is normal. Right ventricular systolic function is normal. Left Atrium: Left atrial size was normal in size. Right Atrium: Right atrial size was normal in size. Pericardium: There is no evidence of pericardial effusion. Mitral Valve: The mitral valve is normal in structure. No evidence of mitral valve regurgitation. No evidence of mitral valve stenosis. MV peak gradient, 6.2 mmHg. The mean mitral valve gradient is 3.0 mmHg. Tricuspid Valve: The tricuspid valve is normal in structure. Tricuspid valve regurgitation is not demonstrated. No evidence of tricuspid stenosis. Aortic Valve: The aortic valve is tricuspid. Aortic valve regurgitation is not visualized. No aortic stenosis is present. Aortic valve mean gradient measures 4.0 mmHg. Aortic valve peak gradient measures 7.2 mmHg. Aortic valve area, by VTI measures 2.20 cm. Pulmonic Valve: The pulmonic valve was  not well visualized. Pulmonic valve regurgitation is not visualized. No evidence of pulmonic stenosis. Aorta: The aortic root is normal in size and structure. Venous: The inferior vena cava is dilated in size with greater than 50% respiratory variability, suggesting right atrial pressure of 8 mmHg. IAS/Shunts: The interatrial septum was not well visualized. Additional Comments: Distal septal, apical hypokinesis with overall preserved LV function.  LEFT VENTRICLE PLAX 2D LVIDd:         5.00 cm   Diastology LVIDs:         3.60 cm   LV e' medial:    4.65 cm/s LV PW:         1.50 cm   LV E/e' medial:  22.2 LV IVS:        1.50 cm   LV e' lateral:   5.18 cm/s LVOT diam:     2.10 cm   LV E/e' lateral: 19.9 LV SV:         56 LV SV Index:   23 LVOT Area:     3.46 cm  RIGHT VENTRICLE            IVC RV S prime:     5.18 cm/s  IVC diam: 2.20 cm TAPSE (M-mode): 1.6 cm LEFT ATRIUM             Index LA diam:        4.10 cm 1.67 cm/m LA Vol (A2C):   36.8 ml 14.95 ml/m LA Vol (A4C):   49.1 ml 19.95 ml/m LA Biplane Vol: 47.0 ml 19.09 ml/m  AORTIC VALVE AV Area (Vmax):    2.32 cm AV Area (Vmean):   2.16 cm AV Area (VTI):     2.20 cm AV Vmax:           134.00 cm/s AV Vmean:          93.000 cm/s AV VTI:            0.254 m AV Peak Grad:      7.2 mmHg AV Mean Grad:      4.0 mmHg LVOT Vmax:  89.80 cm/s LVOT Vmean:        58.000 cm/s LVOT VTI:          0.161 m LVOT/AV VTI ratio: 0.63  AORTA Ao Root diam: 3.00 cm MITRAL VALVE MV Area (PHT): 4.68 cm     SHUNTS MV Area VTI:   1.95 cm     Systemic VTI:  0.16 m MV Peak grad:  6.2 mmHg     Systemic Diam: 2.10 cm MV Mean grad:  3.0 mmHg MV Vmax:       1.24 m/s MV Vmean:      82.4 cm/s MV Decel Time: 162 msec MV E velocity: 103.00 cm/s MV A velocity: 81.30 cm/s MV E/A ratio:  1.27 Kirk Ruths MD Electronically signed by Kirk Ruths MD Signature Date/Time: 10/25/2021/12:34:35 PM    Final    MR FOOT RIGHT WO CONTRAST  Result Date: 10/25/2021 CLINICAL DATA:  Foot swelling,  diabetic, osteomyelitis suspected. 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 New swollen area at the plantar surface of the right foot. No reported open wound or drainage. EXAM: MRI OF THE RIGHT FOREFOOT WITHOUT CONTRAST TECHNIQUE: Multiplanar, multisequence MR imaging of the right forefoot was performed. No intravenous contrast was administered. COMPARISON:  MRI 05/19/2021.  No relevant radiographs available. FINDINGS: Bones/Joint/Cartilage Stable postsurgical changes status post amputation through the 1st metatarsal neck. The amputation site is sharp without residual bone marrow edema to suggest osteomyelitis. The additional metatarsals and phalanges appear unremarkable. The alignment is normal at the Lisfranc joint. There are mild midfoot degenerative changes without significant joint effusions. Ligaments Intact Lisfranc ligament. The collateral ligaments of the remaining metatarsophalangeal joints are intact. Muscles and Tendons No focal intramuscular fluid collection. 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 Abandoned flexor hallucis longus tendon is mildly thickened distally and abuts plantar fluid collection described below. No significant tenosynovitis. Soft tissues There are superficial fluid collections within the plantar medial aspect of the forefoot which appear to be within the skin. The largest component measures 3.2 x 2.6 x 1.1 cm. There are smaller components more distally which extend into the subcutaneous tissues and abut the abandoned flexor hallucis longus tendon. No other focal fluid collections are identified. Marked subcutaneous edema throughout the dorsal aspect the foot is similar to the previous study. IMPRESSION: 1. New large predominately superficial fluid collection within the plantar medial aspect of forefoot which appears to reflect skin blistering and should be clinically obvious. There is a deeper component  which extends towards the flexor hallucis longus tendon. Infection of these fluid collections not excluded. 2. No evidence of tenosynovitis, septic arthritis or osteomyelitis. The amputation through the 1st metatarsal neck has healed without residual marrow edema. 3. Stable marked subcutaneous edema dorsally without other focal fluid collection. Electronically Signed   By: Richardean Sale M.D.   On: 10/25/2021 09:35    Microbiology: No results found for this or any previous visit (from the past 240 hour(s)).   Labs: Basic Metabolic Panel: Recent Labs  Lab 11/01/21 0459 11/02/21 0500 11/03/21 0554 11/04/21 0623 11/05/21 0345  NA 135 140 139 138 138  K 3.9 4.0 4.0 3.9 3.8  CL 103 103 105 102 101  CO2 '24 25 26 28 29  '$ GLUCOSE 181* 187* 183* 180* 196*  BUN 73* 75* 76* 75* 72*  CREATININE 5.22* 4.82* 4.46* 4.62* 4.10*  CALCIUM 9.0 9.3 9.0 8.8* 9.2  PHOS  --  5.1* 4.6 4.5 3.8   Liver Function Tests:  Recent Labs  Lab 11/02/21 0500 11/03/21 0554 11/04/21 0623 11/05/21 0345  ALBUMIN 3.0* 2.8* 2.8* 3.0*   No results for input(s): "LIPASE", "AMYLASE" in the last 168 hours. No results for input(s): "AMMONIA" in the last 168 hours. CBC: Recent Labs  Lab 10/31/21 0640 11/01/21 0459 11/02/21 0500 11/03/21 0554 11/04/21 0623  WBC 7.5 6.6 7.7 6.6 6.8  HGB 7.1* 7.4* 7.8* 7.1* 8.0*  HCT 22.3* 22.1* 23.2* 21.6* 23.8*  MCV 85.1 84.4 84.7 84.7 82.9  PLT 175 188 214 211 216   Cardiac Enzymes: No results for input(s): "CKTOTAL", "CKMB", "CKMBINDEX", "TROPONINI" in the last 168 hours. BNP: BNP (last 3 results) No results for input(s): "BNP" in the last 8760 hours.  ProBNP (last 3 results) No results for input(s): "PROBNP" in the last 8760 hours.  CBG: Recent Labs  Lab 11/04/21 1623 11/04/21 2047 11/04/21 2336 11/05/21 0417 11/05/21 0805  GLUCAP 142* 240* 222* 205* 183*       Signed:  Domenic Polite MD.  [facility] 11/05/2021, 11:09 AM

## 2021-11-05 NOTE — TOC Transition Note (Addendum)
Transition of Care (TOC) - CM/SW Discharge Note Marvetta Gibbons RN, BSN Transitions of Care Unit 4E- RN Case Manager See Treatment Team for direct phone #    Patient Details  Name: [name] MRN: [id number] Date of Birth: 20-May-1965  Transition of Care [facility]) CM/SW Contact:  Dawayne Patricia, RN Phone Number: 11/05/2021, 1:58 PM   Clinical Narrative:    Pt stable for transition home today, orders placed for HHRN/PT/OT, and DME- bariatric rollator.  CM spoke with pt at bedside- pt agreeable to [facility] services and DME. List provided for [facility] choice Per CMS guidelines from medicare.gov website with star ratings (copy placed in shadow chart), per pt she has used [facility] in past and wants to see if they can services again. Pt agreeable to use in-house provider for DME need.  Address, phone # and PCP all confirmed with pt in epic. Pt has transportation later today.   Call made to [facility] - VM left for return call regarding [place] needs.   Call made to [employer] for DME need- rollator to be delivered to room prior to discharge.   1300- Have not received return call from [facility], checked with [facility] to see if they could service for [facility] needs- Per [name] they can accept for RN/PT/OT, CM spoke with pt to let her know [facility] had not returned calls- however [facility] could accept- Pt is agreeable to referral w/ [facility] for [facility] needs.   DME delivery still pending - msg sent to [employer] to check on ETA.  1400- received call from [place] at [place]- per their records pt received a rollator in 2020 under insurance benefits- not eligible for another one at this time- CM spoke with pt at bedside to ask about rollator- pt voiced that she does have a rollator at home- she states she had "forgotten" she had it- pt states she now remembers where it is at home and has access to it.   Pt is ready for transition- no longer needs to wait for DME- has needed DME at home.    Final next level of care: [place] Barriers to Discharge: Barriers Resolved   Patient Goals and CMS Choice Patient states their goals for this hospitalization and ongoing recovery are:: wants to remain independent CMS Medicare.gov Compare Post Acute Care list provided to:: Patient Choice offered to / list presented to : Patient  Discharge Placement               Home w/ [facility]        Discharge Plan and Services   Discharge Planning Services: CM Consult Post Acute Care Choice: Home Health, Durable Medical Equipment          DME Arranged: Walker rolling with seat DME Agency: [facility] Date DME Agency Contacted: 11/05/21 Time DME Agency Contacted: 44 Representative spoke with at DME Agency: Jodell Cipro HH Arranged: RN, Disease Management, PT, OT [place] Agency: [place] Date [address]: 11/05/21 Time [place]: 1330 Representative spoke with at [place]: [place] ([place]) Interventions     Readmission Risk Interventions    11/05/2021    1:58 PM  Readmission Risk Prevention Plan  Transportation Screening Complete  PCP or Specialist Appt within 5-7 Days Complete  Home Care Screening Complete  Medication Review (RN CM) Complete

## 2021-11-05 NOTE — Progress Notes (Signed)
Discharge instructions reviewed with pt. Copy of instructions given to pt, no scripts needed pt states, pharmacy contacted her she states and she told them she had the "fluid pills" at home.   Pt also has a rollator walker at home that she remembered she got back in 2020 she states (CM had spoke with her about it).  Pt d/c'd via wheelchair with belongings, family arriving soon, pt taken to discharge lounge to wait his arrival.           Escorted by staff to d/c lounge.

## 2021-11-05 NOTE — Progress Notes (Signed)
Mobility Specialist: Progress Note   11/05/21 1128  Mobility  Activity Refused mobility   Pt refused mobility stating she is supposed to discharge today.   [facility] S[place] Mobility Specialist Mobility Specialist [address]: [phone]

## 2021-11-05 NOTE — Progress Notes (Signed)
Occupational Therapy Treatment Patient Details Name: [name] MRN: [id number] DOB: [birth year] Today's Date: 11/05/2021   History of present illness Pt is a 56 y.o. female admitted 10/25/21 with chest pain; workup for NSTEMI, CHF. S/p LHC 8/7 showing severe 3-vessel obstructive CAD. Echo with stable LV EF 55-60%. S/p colonoscopy and EGD 8/8. Pt also c/o R foot pain; MRI consistent with skin blistering. 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   OT comments  Patient was able to complete toileting tasks with supervision on this date. Patient was educated on using reacher for LB Dressing tasks and ADLs. Patient verbalized understanding. Patient would continue to benefit from skilled OT services at this time while admitted and after d/c to address noted deficits in order to improve overall safety and independence in ADLs.     Recommendations for follow up therapy are one component of a multi-disciplinary discharge planning process, led by the attending physician.  Recommendations may be updated based on patient status, additional functional criteria and insurance authorization.    Follow Up Recommendations  Home health OT    Assistance Recommended at Discharge Intermittent Supervision/Assistance  Patient can return home with the following  Assist for transportation;Help with stairs or ramp for entrance;Assistance with cooking/housework   Equipment Recommendations  None recommended by OT    Recommendations for Other Services      Precautions / Restrictions Precautions Precautions: Fall;Other (comment) Precaution Comments: Watch O2 sats Restrictions Weight Bearing Restrictions: No       Mobility Bed Mobility                    Transfers                         Balance                                           ADL either performed or assessed with clinical judgement   ADL Overall  ADL's : Needs assistance/impaired                       Lower Body Dressing Details (indicate cue type and reason): patient requested education on using reacher for LB Dressing. patient was provided with reacher at thsi time. patient was verbally and visually demonstrated how to use recher for doffing socks and donning/doffing pants/undergarments. patient verbalized understanding.patient declined to attempt herself. Toilet Transfer: Supervision/safety;Ambulation Toilet Transfer Details (indicate cue type and reason): with increased time to trasnfer from bathroom to bed with no AD Toileting- Clothing Manipulation and Hygiene: Supervision/safety;Sit to/from stand Toileting - Clothing Manipulation Details (indicate cue type and reason): with no AD            Extremity/Trunk Assessment              Vision       Perception     Praxis      Cognition Arousal/Alertness: Awake/alert Behavior During Therapy: WFL for tasks assessed/performed Overall Cognitive Status: Within Functional Limits for tasks assessed                                          Exercises      Shoulder  Instructions       General Comments      Pertinent Vitals/ Pain       Pain Assessment Pain Assessment: No/denies pain Faces Pain Scale: Hurts a little bit Pain Location: abdomen Pain Descriptors / Indicators: Discomfort Pain Intervention(s): Monitored during session  Home Living                                          Prior Functioning/Environment              Frequency  Min 2X/week        Progress Toward Goals  OT Goals(current goals can now be found in the care plan section)  Progress towards OT goals: Progressing toward goals     Plan Discharge plan remains appropriate    Co-evaluation                 AM-PAC OT "6 Clicks" Daily Activity     Outcome Measure   Help from another person eating meals?: None Help from another  person taking care of personal grooming?: None Help from another person toileting, which includes using toliet, bedpan, or urinal?: A Little Help from another person bathing (including washing, rinsing, drying)?: A Lot Help from another person to put on and taking off regular upper body clothing?: A Little Help from another person to put on and taking off regular lower body clothing?: A Lot 6 Click Score: 18    End of Session Equipment Utilized During Treatment: Other (comment) (reacher)  OT Visit Diagnosis: Unsteadiness on feet (R26.81)   Activity Tolerance Patient tolerated treatment well   Patient Left with call bell/phone within reach;in bed   Nurse Communication Mobility status        Time: 3810-1751 OT Time Calculation (min): 15 min  Charges: OT General Charges $OT Visit: 1 Visit OT Treatments $Self Care/Home Management : 8-22 mins  Jackelyn Poling OTR/L, MS Acute Rehabilitation Department Office# [phone] Pager# [phone]   Marcellina Millin 11/05/2021, 1:40 PM

## 2021-11-05 NOTE — Care Management Important Message (Signed)
Important Message  Patient Details  Name: [name] MRN: [id number] Date of Birth: [birth year]   Medicare Important Message Given:  Yes     Shelda Altes 11/05/2021, 10:21 AM

## 2021-11-14 ENCOUNTER — Encounter (HOSPITAL_COMMUNITY): Payer: Self-pay | Admitting: Cardiology

## 2021-11-14 ENCOUNTER — Ambulatory Visit (HOSPITAL_COMMUNITY)
Admit: 2021-11-14 | Discharge: 2021-11-14 | Disposition: A | Discharge status: Home / Self Care | Payer: Medicare Other | Attending: Cardiology | Admitting: Cardiology

## 2021-11-14 VITALS — BP 108/70 | HR 95 | Wt 320.2 lb

## 2021-11-14 DIAGNOSIS — Z79899 Other long term (current) drug therapy: Secondary | ICD-10-CM | POA: Diagnosis not present

## 2021-11-14 DIAGNOSIS — I2510 Atherosclerotic heart disease of native coronary artery without angina pectoris: Secondary | ICD-10-CM | POA: Diagnosis not present

## 2021-11-14 DIAGNOSIS — I252 Old myocardial infarction: Secondary | ICD-10-CM | POA: Insufficient documentation

## 2021-11-14 DIAGNOSIS — E669 Obesity, unspecified: Secondary | ICD-10-CM | POA: Insufficient documentation

## 2021-11-14 DIAGNOSIS — Z7902 Long term (current) use of antithrombotics/antiplatelets: Secondary | ICD-10-CM | POA: Insufficient documentation

## 2021-11-14 DIAGNOSIS — I872 Venous insufficiency (chronic) (peripheral): Secondary | ICD-10-CM | POA: Diagnosis not present

## 2021-11-14 DIAGNOSIS — I10 Essential (primary) hypertension: Secondary | ICD-10-CM | POA: Diagnosis not present

## 2021-11-14 DIAGNOSIS — G4733 Obstructive sleep apnea (adult) (pediatric): Secondary | ICD-10-CM | POA: Insufficient documentation

## 2021-11-14 DIAGNOSIS — I130 Hypertensive heart and chronic kidney disease with heart failure and stage 1 through stage 4 chronic kidney disease, or unspecified chronic kidney disease: Secondary | ICD-10-CM | POA: Diagnosis not present

## 2021-11-14 DIAGNOSIS — Z955 Presence of coronary angioplasty implant and graft: Secondary | ICD-10-CM | POA: Diagnosis not present

## 2021-11-14 DIAGNOSIS — N184 Chronic kidney disease, stage 4 (severe): Secondary | ICD-10-CM | POA: Diagnosis not present

## 2021-11-14 DIAGNOSIS — Z6841 Body Mass Index (BMI) 40.0 and over, adult: Secondary | ICD-10-CM | POA: Diagnosis not present

## 2021-11-14 DIAGNOSIS — I5032 Chronic diastolic (congestive) heart failure: Secondary | ICD-10-CM | POA: Diagnosis not present

## 2021-11-14 DIAGNOSIS — E785 Hyperlipidemia, unspecified: Secondary | ICD-10-CM | POA: Insufficient documentation

## 2021-11-14 DIAGNOSIS — I5022 Chronic systolic (congestive) heart failure: Secondary | ICD-10-CM

## 2021-11-14 DIAGNOSIS — I25810 Atherosclerosis of coronary artery bypass graft(s) without angina pectoris: Secondary | ICD-10-CM | POA: Diagnosis not present

## 2021-11-14 DIAGNOSIS — E1122 Type 2 diabetes mellitus with diabetic chronic kidney disease: Secondary | ICD-10-CM | POA: Diagnosis not present

## 2021-11-14 LAB — BASIC METABOLIC PANEL
Anion gap: 8 (ref 5–15)
BUN: 58 mg/dL — ABNORMAL HIGH (ref 6–20)
CO2: 28 mmol/L (ref 22–32)
Calcium: 9.4 mg/dL (ref 8.9–10.3)
Chloride: 104 mmol/L (ref 98–111)
Creatinine, Ser: 3.04 mg/dL — ABNORMAL HIGH (ref 0.44–1.00)
GFR, Estimated: 17 mL/min — ABNORMAL LOW (ref >60)
Glucose, Bld: 278 mg/dL — ABNORMAL HIGH (ref 70–99)
Potassium: 3.8 mmol/L (ref 3.5–5.1)
Sodium: 140 mmol/L (ref 135–145)

## 2021-11-14 LAB — CBC
HCT: 27.6 % — ABNORMAL LOW (ref 36.0–46.0)
Hemoglobin: 8.8 g/dL — ABNORMAL LOW (ref 12.0–15.0)
MCH: 28.4 pg (ref 26.0–34.0)
MCHC: 31.9 g/dL (ref 30.0–36.0)
MCV: 89 fL (ref 80.0–100.0)
Platelets: 189 10*3/uL (ref 150–400)
RBC: 3.1 MIL/uL — ABNORMAL LOW (ref 3.87–5.11)
RDW: 17.7 % — ABNORMAL HIGH (ref 11.5–15.5)
WBC: 8.5 10*3/uL (ref 4.0–10.5)
nRBC: 0.2 % (ref 0.0–0.2)

## 2021-11-14 LAB — BRAIN NATRIURETIC PEPTIDE: B Natriuretic Peptide: 1155.6 pg/mL — ABNORMAL HIGH (ref 0.0–100.0)

## 2021-11-14 MED ORDER — TORSEMIDE 20 MG PO TABS: 80.0000 mg | ORAL_TABLET | Freq: Two times a day (BID) | ORAL | 11 refills | Status: DC

## 2021-11-14 NOTE — Patient Instructions (Addendum)
EKG done today.  Labs done today. We will contact you only if your labs are abnormal.  INCREASE Torsemide '80mg'$  (4 tablets) by mouth 2 times daily.   No other medication changes were made. Please continue all current medications as prescribed.  You have been referred to [facility]. They will contact you to schedule an appointment.   Your physician recommends that you schedule a follow-up appointment in: 10 days for a lab only appointment (a paper prescription was provided to you during your appointment today) and in 1 month with Dr. Aundra Dubin.  If you have any questions or concerns before your next appointment please send [place] a message through [place] or call our office at [phone].    TO LEAVE A MESSAGE FOR THE NURSE SELECT OPTION 2, PLEASE LEAVE A MESSAGE INCLUDING: YOUR NAME DATE OF BIRTH CALL BACK NUMBER REASON FOR CALL**this is important as we prioritize the call backs  YOU WILL RECEIVE A CALL BACK THE SAME DAY AS LONG AS YOU CALL BEFORE 4:00 PM   Do the following things EVERYDAY: Weigh yourself in the morning before breakfast. Write it down and keep it in a log. Take your medicines as prescribed Eat low salt foods--Limit salt (sodium) to 2000 mg per day.  Stay as active as you can everyday Limit all fluids for the day to less than 2 liters   At the [facility], you and your health needs are our priority. As part of our continuing mission to provide you with exceptional heart care, we have created designated Provider Care Teams. These Care Teams include your primary Cardiologist (physician) and Advanced Practice Providers (APPs- Physician Assistants and Nurse Practitioners) who all work together to provide you with the care you need, when you need it.   You may see any of the following providers on your designated Care Team at your next follow up: Dr Glori Bickers Dr Haynes Kerns, NP Lyda Jester, [place] Audry Riles, PharmD   Please  be sure to bring in all your medications bottles to every appointment.
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
## 2021-12-02 ENCOUNTER — Ambulatory Visit: Payer: Medicare Other | Admitting: Internal Medicine

## 2021-12-11 ENCOUNTER — Encounter (HOSPITAL_COMMUNITY): Payer: Self-pay

## 2021-12-12 ENCOUNTER — Other Ambulatory Visit (HOSPITAL_COMMUNITY): Payer: Self-pay | Admitting: Cardiology

## 2021-12-13 ENCOUNTER — Other Ambulatory Visit (HOSPITAL_COMMUNITY): Payer: Self-pay | Admitting: Cardiology

## 2021-12-16 ENCOUNTER — Encounter (HOSPITAL_BASED_OUTPATIENT_CLINIC_OR_DEPARTMENT_OTHER): Payer: Medicare Other | Attending: Internal Medicine | Admitting: Internal Medicine

## 2021-12-17 ENCOUNTER — Inpatient Hospital Stay (HOSPITAL_COMMUNITY): Payer: Medicare Other

## 2021-12-17 ENCOUNTER — Other Ambulatory Visit: Payer: Self-pay

## 2021-12-17 ENCOUNTER — Inpatient Hospital Stay (HOSPITAL_COMMUNITY)
Admission: RE | Admit: 2021-12-17 | Discharge: 2022-01-06 | DRG: 638 | Disposition: A | Discharge status: Home Health | Payer: Medicare Other | Source: Ambulatory Visit | Attending: Family Medicine | Admitting: Family Medicine

## 2021-12-17 ENCOUNTER — Encounter (HOSPITAL_COMMUNITY): Payer: Medicare Other

## 2021-12-17 ENCOUNTER — Encounter (HOSPITAL_COMMUNITY): Payer: Self-pay | Admitting: Internal Medicine

## 2021-12-17 DIAGNOSIS — N179 Acute kidney failure, unspecified: Secondary | ICD-10-CM | POA: Diagnosis present

## 2021-12-17 DIAGNOSIS — Z794 Long term (current) use of insulin: Secondary | ICD-10-CM

## 2021-12-17 DIAGNOSIS — Z79899 Other long term (current) drug therapy: Secondary | ICD-10-CM

## 2021-12-17 DIAGNOSIS — I152 Hypertension secondary to endocrine disorders: Secondary | ICD-10-CM | POA: Diagnosis not present

## 2021-12-17 DIAGNOSIS — Z882 Allergy status to sulfonamides status: Secondary | ICD-10-CM

## 2021-12-17 DIAGNOSIS — E1165 Type 2 diabetes mellitus with hyperglycemia: Secondary | ICD-10-CM | POA: Diagnosis present

## 2021-12-17 DIAGNOSIS — N184 Chronic kidney disease, stage 4 (severe): Secondary | ICD-10-CM | POA: Diagnosis present

## 2021-12-17 DIAGNOSIS — E11628 Type 2 diabetes mellitus with other skin complications: Principal | ICD-10-CM | POA: Diagnosis present

## 2021-12-17 DIAGNOSIS — Y9301 Activity, walking, marching and hiking: Secondary | ICD-10-CM | POA: Diagnosis not present

## 2021-12-17 DIAGNOSIS — Z6841 Body Mass Index (BMI) 40.0 and over, adult: Secondary | ICD-10-CM

## 2021-12-17 DIAGNOSIS — R531 Weakness: Secondary | ICD-10-CM

## 2021-12-17 DIAGNOSIS — E1169 Type 2 diabetes mellitus with other specified complication: Secondary | ICD-10-CM | POA: Diagnosis present

## 2021-12-17 DIAGNOSIS — I120 Hypertensive chronic kidney disease with stage 5 chronic kidney disease or end stage renal disease: Secondary | ICD-10-CM

## 2021-12-17 DIAGNOSIS — I5032 Chronic diastolic (congestive) heart failure: Secondary | ICD-10-CM | POA: Diagnosis not present

## 2021-12-17 DIAGNOSIS — Z7189 Other specified counseling: Secondary | ICD-10-CM | POA: Diagnosis not present

## 2021-12-17 DIAGNOSIS — L89312 Pressure ulcer of right buttock, stage 2: Secondary | ICD-10-CM | POA: Diagnosis not present

## 2021-12-17 DIAGNOSIS — E1151 Type 2 diabetes mellitus with diabetic peripheral angiopathy without gangrene: Secondary | ICD-10-CM | POA: Diagnosis present

## 2021-12-17 DIAGNOSIS — Z7902 Long term (current) use of antithrombotics/antiplatelets: Secondary | ICD-10-CM

## 2021-12-17 DIAGNOSIS — I25810 Atherosclerosis of coronary artery bypass graft(s) without angina pectoris: Secondary | ICD-10-CM | POA: Diagnosis present

## 2021-12-17 DIAGNOSIS — D638 Anemia in other chronic diseases classified elsewhere: Secondary | ICD-10-CM | POA: Diagnosis present

## 2021-12-17 DIAGNOSIS — E861 Hypovolemia: Secondary | ICD-10-CM | POA: Diagnosis not present

## 2021-12-17 DIAGNOSIS — N185 Chronic kidney disease, stage 5: Secondary | ICD-10-CM | POA: Diagnosis not present

## 2021-12-17 DIAGNOSIS — Z91040 Latex allergy status: Secondary | ICD-10-CM

## 2021-12-17 DIAGNOSIS — R7989 Other specified abnormal findings of blood chemistry: Secondary | ICD-10-CM | POA: Diagnosis present

## 2021-12-17 DIAGNOSIS — L97419 Non-pressure chronic ulcer of right heel and midfoot with unspecified severity: Secondary | ICD-10-CM

## 2021-12-17 DIAGNOSIS — R509 Fever, unspecified: Secondary | ICD-10-CM | POA: Diagnosis not present

## 2021-12-17 DIAGNOSIS — G9349 Other encephalopathy: Secondary | ICD-10-CM | POA: Diagnosis not present

## 2021-12-17 DIAGNOSIS — E871 Hypo-osmolality and hyponatremia: Secondary | ICD-10-CM | POA: Diagnosis not present

## 2021-12-17 DIAGNOSIS — M109 Gout, unspecified: Secondary | ICD-10-CM | POA: Diagnosis present

## 2021-12-17 DIAGNOSIS — E11621 Type 2 diabetes mellitus with foot ulcer: Secondary | ICD-10-CM | POA: Diagnosis present

## 2021-12-17 DIAGNOSIS — L089 Local infection of the skin and subcutaneous tissue, unspecified: Secondary | ICD-10-CM | POA: Diagnosis present

## 2021-12-17 DIAGNOSIS — I132 Hypertensive heart and chronic kidney disease with heart failure and with stage 5 chronic kidney disease, or end stage renal disease: Secondary | ICD-10-CM | POA: Diagnosis present

## 2021-12-17 DIAGNOSIS — N186 End stage renal disease: Secondary | ICD-10-CM | POA: Diagnosis present

## 2021-12-17 DIAGNOSIS — E1142 Type 2 diabetes mellitus with diabetic polyneuropathy: Secondary | ICD-10-CM | POA: Diagnosis present

## 2021-12-17 DIAGNOSIS — Z515 Encounter for palliative care: Secondary | ICD-10-CM

## 2021-12-17 DIAGNOSIS — L97511 Non-pressure chronic ulcer of other part of right foot limited to breakdown of skin: Secondary | ICD-10-CM | POA: Diagnosis not present

## 2021-12-17 DIAGNOSIS — Y92231 Patient bathroom in hospital as the place of occurrence of the external cause: Secondary | ICD-10-CM | POA: Diagnosis not present

## 2021-12-17 DIAGNOSIS — L97519 Non-pressure chronic ulcer of other part of right foot with unspecified severity: Secondary | ICD-10-CM | POA: Diagnosis present

## 2021-12-17 DIAGNOSIS — F32A Depression, unspecified: Secondary | ICD-10-CM | POA: Diagnosis present

## 2021-12-17 DIAGNOSIS — W010XXA Fall on same level from slipping, tripping and stumbling without subsequent striking against object, initial encounter: Secondary | ICD-10-CM | POA: Diagnosis not present

## 2021-12-17 DIAGNOSIS — R278 Other lack of coordination: Secondary | ICD-10-CM | POA: Diagnosis not present

## 2021-12-17 DIAGNOSIS — L280 Lichen simplex chronicus: Secondary | ICD-10-CM | POA: Diagnosis present

## 2021-12-17 DIAGNOSIS — E1159 Type 2 diabetes mellitus with other circulatory complications: Secondary | ICD-10-CM | POA: Diagnosis present

## 2021-12-17 DIAGNOSIS — I2510 Atherosclerotic heart disease of native coronary artery without angina pectoris: Secondary | ICD-10-CM | POA: Diagnosis not present

## 2021-12-17 DIAGNOSIS — S79812A Other specified injuries of left hip, initial encounter: Secondary | ICD-10-CM | POA: Diagnosis not present

## 2021-12-17 DIAGNOSIS — I5022 Chronic systolic (congestive) heart failure: Secondary | ICD-10-CM

## 2021-12-17 DIAGNOSIS — G4733 Obstructive sleep apnea (adult) (pediatric): Secondary | ICD-10-CM | POA: Diagnosis not present

## 2021-12-17 DIAGNOSIS — D631 Anemia in chronic kidney disease: Secondary | ICD-10-CM | POA: Diagnosis present

## 2021-12-17 DIAGNOSIS — E1122 Type 2 diabetes mellitus with diabetic chronic kidney disease: Secondary | ICD-10-CM | POA: Diagnosis present

## 2021-12-17 DIAGNOSIS — E119 Type 2 diabetes mellitus without complications: Secondary | ICD-10-CM | POA: Diagnosis not present

## 2021-12-17 DIAGNOSIS — Z20822 Contact with and (suspected) exposure to covid-19: Secondary | ICD-10-CM | POA: Diagnosis present

## 2021-12-17 DIAGNOSIS — I878 Other specified disorders of veins: Secondary | ICD-10-CM | POA: Diagnosis present

## 2021-12-17 DIAGNOSIS — I872 Venous insufficiency (chronic) (peripheral): Secondary | ICD-10-CM | POA: Diagnosis present

## 2021-12-17 DIAGNOSIS — R490 Dysphonia: Secondary | ICD-10-CM | POA: Diagnosis not present

## 2021-12-17 DIAGNOSIS — Z888 Allergy status to other drugs, medicaments and biological substances status: Secondary | ICD-10-CM

## 2021-12-17 DIAGNOSIS — I2582 Chronic total occlusion of coronary artery: Secondary | ICD-10-CM | POA: Diagnosis present

## 2021-12-17 DIAGNOSIS — K8020 Calculus of gallbladder without cholecystitis without obstruction: Secondary | ICD-10-CM | POA: Diagnosis present

## 2021-12-17 DIAGNOSIS — Z951 Presence of aortocoronary bypass graft: Secondary | ICD-10-CM

## 2021-12-17 DIAGNOSIS — T361X5A Adverse effect of cephalosporins and other beta-lactam antibiotics, initial encounter: Secondary | ICD-10-CM | POA: Diagnosis not present

## 2021-12-17 DIAGNOSIS — Z881 Allergy status to other antibiotic agents status: Secondary | ICD-10-CM

## 2021-12-17 DIAGNOSIS — G253 Myoclonus: Secondary | ICD-10-CM | POA: Diagnosis not present

## 2021-12-17 DIAGNOSIS — R937 Abnormal findings on diagnostic imaging of other parts of musculoskeletal system: Secondary | ICD-10-CM | POA: Diagnosis not present

## 2021-12-17 DIAGNOSIS — R6884 Jaw pain: Secondary | ICD-10-CM | POA: Diagnosis not present

## 2021-12-17 DIAGNOSIS — E785 Hyperlipidemia, unspecified: Secondary | ICD-10-CM | POA: Diagnosis present

## 2021-12-17 DIAGNOSIS — L270 Generalized skin eruption due to drugs and medicaments taken internally: Secondary | ICD-10-CM | POA: Diagnosis not present

## 2021-12-17 DIAGNOSIS — R7401 Elevation of levels of liver transaminase levels: Secondary | ICD-10-CM | POA: Diagnosis present

## 2021-12-17 LAB — COMPREHENSIVE METABOLIC PANEL
ALT: 62 U/L — ABNORMAL HIGH (ref 0–44)
AST: 49 U/L — ABNORMAL HIGH (ref 15–41)
Albumin: 2.8 g/dL — ABNORMAL LOW (ref 3.5–5.0)
Alkaline Phosphatase: 31 U/L — ABNORMAL LOW (ref 38–126)
Anion gap: 13 (ref 5–15)
BUN: 93 mg/dL — ABNORMAL HIGH (ref 6–20)
CO2: 24 mmol/L (ref 22–32)
Calcium: 9.2 mg/dL (ref 8.9–10.3)
Chloride: 100 mmol/L (ref 98–111)
Creatinine, Ser: 3.51 mg/dL — ABNORMAL HIGH (ref 0.44–1.00)
GFR, Estimated: 15 mL/min — ABNORMAL LOW (ref >60)
Glucose, Bld: 203 mg/dL — ABNORMAL HIGH (ref 70–99)
Potassium: 3.8 mmol/L (ref 3.5–5.1)
Sodium: 137 mmol/L (ref 135–145)
Total Bilirubin: 1.2 mg/dL (ref 0.3–1.2)
Total Protein: 7.7 g/dL (ref 6.5–8.1)

## 2021-12-17 LAB — CBC
HCT: 22 % — ABNORMAL LOW (ref 36.0–46.0)
Hemoglobin: 7.2 g/dL — ABNORMAL LOW (ref 12.0–15.0)
MCH: 28.5 pg (ref 26.0–34.0)
MCHC: 32.7 g/dL (ref 30.0–36.0)
MCV: 87 fL (ref 80.0–100.0)
Platelets: 169 10*3/uL (ref 150–400)
RBC: 2.53 MIL/uL — ABNORMAL LOW (ref 3.87–5.11)
RDW: 15.9 % — ABNORMAL HIGH (ref 11.5–15.5)
WBC: 8.2 10*3/uL (ref 4.0–10.5)
nRBC: 0.6 % — ABNORMAL HIGH (ref 0.0–0.2)

## 2021-12-17 LAB — GLUCOSE, CAPILLARY
Glucose-Capillary: 213 mg/dL — ABNORMAL HIGH (ref 70–99)
Glucose-Capillary: 149 mg/dL — ABNORMAL HIGH (ref 70–99)
Glucose-Capillary: 141 mg/dL — ABNORMAL HIGH (ref 70–99)
Glucose-Capillary: 179 mg/dL — ABNORMAL HIGH (ref 70–99)

## 2021-12-17 LAB — VANCOMYCIN, RANDOM: Vancomycin Rm: 25 ug/mL

## 2021-12-17 LAB — SEDIMENTATION RATE: Sed Rate: 72 mm/hr — ABNORMAL HIGH (ref 0–22)

## 2021-12-17 LAB — PREALBUMIN: Prealbumin: 11 mg/dL — ABNORMAL LOW (ref 18–38)

## 2021-12-17 LAB — C-REACTIVE PROTEIN: CRP: 3.6 mg/dL — ABNORMAL HIGH (ref <1.0)

## 2021-12-17 MED ORDER — ENSURE MAX PROTEIN PO LIQD
11.0000 [oz_av] | Freq: Every day | ORAL | Status: DC
  Administered 2021-12-17 – 2021-12-22 (×6): 11 [oz_av] via ORAL
  Administered 2021-12-23: 4 [oz_av] via ORAL
  Administered 2021-12-24 – 2021-12-25 (×2): 11 [oz_av] via ORAL
  Filled 2021-12-17 (×9): qty 330

## 2021-12-17 MED ORDER — ACETAMINOPHEN 325 MG PO TABS
650.0000 mg | ORAL_TABLET | Freq: Four times a day (QID) | ORAL | Status: DC | PRN
  Administered 2021-12-17 – 2022-01-04 (×10): 650 mg via ORAL
  Filled 2021-12-17 (×11): qty 2

## 2021-12-17 MED ORDER — INSULIN ASPART 100 UNIT/ML IJ SOLN
0.0000 [IU] | INTRAMUSCULAR | Status: DC
  Administered 2021-12-17: 2 [IU] via SUBCUTANEOUS

## 2021-12-17 MED ORDER — CARVEDILOL 12.5 MG PO TABS
12.5000 mg | ORAL_TABLET | Freq: Two times a day (BID) | ORAL | Status: DC
  Administered 2021-12-17 – 2022-01-06 (×33): 12.5 mg via ORAL
  Filled 2021-12-17 (×37): qty 1

## 2021-12-17 MED ORDER — INSULIN GLARGINE-YFGN 100 UNIT/ML ~~LOC~~ SOLN
30.0000 [IU] | Freq: Every day | SUBCUTANEOUS | Status: DC
  Filled 2021-12-17: qty 0.3

## 2021-12-17 MED ORDER — RANOLAZINE ER 500 MG PO TB12
500.0000 mg | ORAL_TABLET | Freq: Two times a day (BID) | ORAL | Status: DC
  Administered 2021-12-17 – 2021-12-23 (×13): 500 mg via ORAL
  Filled 2021-12-17 (×14): qty 1

## 2021-12-17 MED ORDER — GABAPENTIN 300 MG PO CAPS
300.0000 mg | ORAL_CAPSULE | Freq: Every day | ORAL | Status: DC
  Administered 2021-12-17 – 2021-12-27 (×11): 300 mg via ORAL
  Filled 2021-12-17 (×11): qty 1

## 2021-12-17 MED ORDER — CITALOPRAM HYDROBROMIDE 20 MG PO TABS
20.0000 mg | ORAL_TABLET | Freq: Every day | ORAL | Status: DC
  Administered 2021-12-17 – 2021-12-26 (×11): 20 mg via ORAL
  Filled 2021-12-17 (×13): qty 1

## 2021-12-17 MED ORDER — INSULIN ASPART 100 UNIT/ML IJ SOLN
0.0000 [IU] | Freq: Three times a day (TID) | INTRAMUSCULAR | Status: DC
  Administered 2021-12-18: 2 [IU] via SUBCUTANEOUS
  Administered 2021-12-18 – 2021-12-19 (×3): 1 [IU] via SUBCUTANEOUS
  Administered 2021-12-19: 2 [IU] via SUBCUTANEOUS
  Administered 2021-12-20 – 2021-12-23 (×7): 1 [IU] via SUBCUTANEOUS
  Administered 2021-12-24: 2 [IU] via SUBCUTANEOUS
  Administered 2021-12-24: 1 [IU] via SUBCUTANEOUS
  Administered 2021-12-25: 3 [IU] via SUBCUTANEOUS
  Administered 2021-12-25 (×2): 2 [IU] via SUBCUTANEOUS
  Administered 2021-12-26: 1 [IU] via SUBCUTANEOUS

## 2021-12-17 MED ORDER — ACETAMINOPHEN 650 MG RE SUPP: 650.0000 mg | Freq: Four times a day (QID) | RECTAL | Status: DC | PRN

## 2021-12-17 MED ORDER — SODIUM CHLORIDE 0.9 % IV SOLN
2.0000 g | INTRAVENOUS | Status: DC
  Administered 2021-12-17 – 2021-12-19 (×3): 2 g via INTRAVENOUS
  Filled 2021-12-17 (×3): qty 20

## 2021-12-17 MED ORDER — METRONIDAZOLE 500 MG PO TABS
500.0000 mg | ORAL_TABLET | Freq: Two times a day (BID) | ORAL | Status: DC
  Administered 2021-12-17 – 2021-12-21 (×8): 500 mg via ORAL
  Filled 2021-12-17 (×9): qty 1

## 2021-12-17 MED ORDER — ISOSORBIDE MONONITRATE ER 60 MG PO TB24
120.0000 mg | ORAL_TABLET | Freq: Every morning | ORAL | Status: DC
  Administered 2021-12-17 – 2021-12-21 (×5): 120 mg via ORAL
  Filled 2021-12-17 (×6): qty 2

## 2021-12-17 MED ORDER — INSULIN GLARGINE-YFGN 100 UNIT/ML ~~LOC~~ SOLN
25.0000 [IU] | Freq: Two times a day (BID) | SUBCUTANEOUS | Status: DC
  Administered 2021-12-17 – 2021-12-28 (×23): 25 [IU] via SUBCUTANEOUS
  Filled 2021-12-17 (×26): qty 0.25

## 2021-12-17 MED ORDER — HYDROCERIN EX CREA
TOPICAL_CREAM | CUTANEOUS | Status: AC
  Filled 2021-12-17: qty 113

## 2021-12-17 MED ORDER — ROSUVASTATIN CALCIUM 5 MG PO TABS
5.0000 mg | ORAL_TABLET | Freq: Every day | ORAL | Status: DC
  Administered 2021-12-17 – 2022-01-06 (×20): 5 mg via ORAL
  Filled 2021-12-17 (×22): qty 1

## 2021-12-17 NOTE — Progress Notes (Signed)
   12/17/21 1600  Mobility  Activity Ambulated with assistance in room  Level of Assistance Standby assist, set-up cues, supervision of patient - no hands on  Assistive Device Front wheel walker  Distance Ambulated (ft) 15 ft  Activity Response Tolerated well  $Mobility charge 1 Mobility   Mobility Specialist Progress Note  Received pt EOB having no complaints and agreeable to mobility. Pt was asymptomatic throughout ambulation and returned to room w/o fault. Left EOB w/ call bell in reach and all needs met.   Lucious Groves Mobility Specialist
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
## 2021-12-17 NOTE — Consult Note (Addendum)
WOC Nurse Consult Note: Consult requested for cyst on vagina, bilat legs, and right foot. Pt has loose dry peeling crusted skin to bilat legs.  Right anterior calf with previous full thickness wound which has healed and is dry darker-colored scar tissue, no open wound or drainage. Right plantar foot with full thickness wound, .3X.3X.2cm, 10% yellow, 90% red, dry peeling skin surrounding, small amt tan drainage, no odor or fluctuance.   Bedside nurse had stated the patient has a cyst on her vagina.  Pt declines my offer to assess the location. She stated; "it is just a pimple, it's been there awhile and does not hurt.  You do not need to look at that place."  Dressing procedure/placement/frequency: Topical treatment orders provided for bedside nurses to perform as follows to absorb drainage and provide antimicrobial benefits:  1. Apply a piece of Aquacel Kellie Simmering # [phone]) to right foot wound Q day, then cover with foam dressing.  Change foam dressing Q 3 days or PRN soiling.  2. Apply Eucerin to bilat legs Q day.  Roughly towel dry every day after bathing to assist with removal of loose skin. Please re-consult if further assistance is needed.  Thank-you,  Julien Girt MSN, [name], [place], [place], [place]

## 2021-12-17 NOTE — Progress Notes (Signed)
PROGRESS NOTE    [name]  QIO:[id number] DOB: 1965-06-03 DOA: 12/17/2021 PCP: Jacklynn Ganong, MD   Brief Narrative: No notes on file   Assessment and Plan:  Diabetic foot infection Right plantar surface wound. Patient with purulent drainage from wound. Patient follows with wound care as an outpatient. Recent x-ray and MRI imaging without evidence of osteomyelitis but with evidence of fluid collection concerning for phlegmon vs abscess. Patient is stable, without fevers or leukocytosis however with elevated ESR and CRP of 78 and 15 respectively. Patient started empirically on Vancomycin at outside hospital and transitioned to Ceftriaxone and Flagyl on admission. Recent ABIs with non-compressible  arteries. Complicated by severe CKD. Superficial culture obtained on 9/21 is significant for Cirobacter koseri and is pansensitive. No blood cultures obtained. -Podiatry consult -Continue Ceftriaxone/Flagyl  AKI on CKD stage IV Baseline creatinine of about 2.8-3. Creatinine of 3.51 on day prior to admission. Torsemide held.  -Recheck BMP; reevaluate fluid status -If continued worsening, may need to consult nephrology  Diabetes mellitus type 2 Patient is on long term insulin. Uncontrolled with hyperglycemia. Hemoglobin A1C of 6.9% from 10/2021. Patient is prescribed Lantus 35 units BID and Humalog 5 units TID with meals in addition to a sliding scale as an outpatient. Patient started on Semglee 30 units qHS and SSI during this admission -Start Semglee 25 units BID and continue SSI  Chronic diastolic heart failure Stable. Torsemide held secondary to AKI  Elevated LFTs AST/ALT of 364/138 respectively and trended down. Total bilirubin of 1.2. RUQ with cholelithiasis without cholecystitis and borderline CBD dilation; no associated symptoms.  CAD Patient is currently stable. She is on Effient, Ranexa, aspirin and Imdur as an outpatient.  OSA -Continue CPAP  Chronic anemia Baseline  hemoglobin of 7-8. No evidence of acute bleeding. Stable. -CBC in AM  DVT prophylaxis: SCDs Code Status:   Code Status: Full Code Family Communication: None at bedside Disposition Plan: Discharge pending transition to outpatient antibiotic regimen and podiatry recommendations   Consultants:  Podiatry  Procedures:  None  Antimicrobials: Ceftriaxone Flagyl    Subjective: Patient reports no significant foot pain. No issues overnight. Hungry.  Objective: BP 104/66 (BP Location: Right Arm)   Pulse 74   Temp 98 F (36.7 C) (Oral)   Resp 19   Ht _0  (1.702 m)   Wt (!) 148.3 kg   SpO2 98%   BMI 51.20 kg/m   Examination:  General exam: Appears calm and comfortable Respiratory system: Clear to auscultation. Respiratory effort normal. Cardiovascular system: S1 & S2 heard, RRR. No murmurs, rubs, gallops or clicks. Gastrointestinal system: Abdomen is nondistended, soft and nontender. Normal bowel sounds heard. Central nervous system: Alert and oriented. No focal neurological deficits. Musculoskeletal: No edema. No calf tenderness. Right plantar surface of foot with non-draining ulcer Skin: remnants of likely bullae noted  Psychiatry: Judgement and insight appear normal. Mood & affect appropriate.    Data Reviewed: I have personally reviewed following labs and imaging studies  CBC Lab Results  Component Value Date   WBC 8.2 12/17/2021   RBC 2.53 (L) 12/17/2021   HGB 7.2 (L) 12/17/2021   HCT 22.0 (L) 12/17/2021   MCV 87.0 12/17/2021   MCH 28.5 12/17/2021   PLT 169 12/17/2021   MCHC 32.7 12/17/2021   RDW 15.9 (H) 12/17/2021   LYMPHSABS 2.4 02/24/2010   MONOABS 0.6 02/24/2010   EOSABS 0.1 02/24/2010   BASOSABS 0.0 32/44/0102     Last metabolic panel Lab Results  Component  Value Date   NA 137 12/17/2021   K 3.8 12/17/2021   CL 100 12/17/2021   CO2 24 12/17/2021   BUN 93 (H) 12/17/2021   CREATININE 3.51 (H) 12/17/2021   GLUCOSE 203 (H) 12/17/2021    GFRNONAA 15 (L) 12/17/2021   GFRAA 33 (L) 06/15/2019   CALCIUM 9.2 12/17/2021   PHOS 3.8 11/05/2021   PROT 7.7 12/17/2021   ALBUMIN 2.8 (L) 12/17/2021   LABGLOB 3.7 10/30/2021   AGRATIO 0.8 10/30/2021   BILITOT 1.2 12/17/2021   ALKPHOS 31 (L) 12/17/2021   AST 49 (H) 12/17/2021   ALT 62 (H) 12/17/2021   ANIONGAP 13 12/17/2021    GFR: Estimated Creatinine Clearance: 26.9 mL/min (A) (by C-G formula based on SCr of 3.51 mg/dL (H)).  No results found for this or any previous visit (from the past 240 hour(s)).    Radiology Studies: No results found.    LOS: 0 days    Cordelia Poche, MD [facility] 12/17/2021, 2:51 PM   If 7PM-7AM, please contact night-coverage [email or web address]

## 2021-12-17 NOTE — Progress Notes (Signed)
Initial Nutrition Assessment  DOCUMENTATION CODES:   Morbid obesity  INTERVENTION:  -Provide Carb Modified Diet as tolerated -Trial Ensure Max q day (150kcal, 30g protein)  NUTRITION DIAGNOSIS:  Increased nutrient needs related to wound healing as evidenced by other (comment) (increased metabolic need for healing).  GOAL:  Patient will meet greater than or equal to 90% of their needs  MONITOR:  PO intake, Supplement acceptance  REASON FOR ASSESSMENT:  Consult Wound healing  ASSESSMENT:  Pt is a 56yo F with PMH of CAD s/p CABG and multiple stents, HFpEF, HTN, HLD, chronic LE wounds, CKD 4, IDDM, and OSA on CPAP who presents for evaluation of R foot ulcer.  Visited pt at bedside this afternoon. Reports good appetite PTA. Denies following a special therapeutic diet but tries to use portion control and vary diet to meet nutrition needs. Pt with adequate BG control for age with A1c of 6.9. Pt with chronic LE wounds but denies ever being told to increase protein or take vitamins. She has tried taking multivitamin in the past but it was difficult to swallow and cause upset stomach. She reports eating TID but has been having difficulty preparing meals therefore getting food from a cafeteria.  Diet Recall:  B-eggs or oatmeal and peaches L-meat, potato and pasta salad D-meat veggie and potato Beverages: water, juice or ginger ale  Discussed the importance of adequate nutrition for healing. Pt is agreeable to try Ensure Max protein q day to provide 150kcal and 30g protein/bottle. She is concerned with GI distress and ONS, encouraged her to start with a few sips. RD to follow up for ONS acceptance.  Medications reviewed and include: novolog, semglee, crestor  Labs reviewed: BG:278, BUN:58, Cr:3.04, GFR:17, BNP:1155.6, A1c (8/23):6.9  NUTRITION - FOCUSED PHYSICAL EXAM:  Flowsheet Row Most Recent Value  Orbital Region No depletion  Upper Arm Region No depletion  Thoracic and Lumbar  Region No depletion  Buccal Region No depletion  Temple Region No depletion  Clavicle Bone Region No depletion  Clavicle and Acromion Bone Region No depletion  Scapular Bone Region No depletion  Dorsal Hand No depletion  Patellar Region No depletion  Anterior Thigh Region No depletion  Posterior Calf Region No depletion  Edema (RD Assessment) Moderate  Hair Reviewed  Eyes Reviewed  Mouth Reviewed  Skin Reviewed  Nails Reviewed       Diet Order:   Diet Order             Diet Carb Modified Fluid consistency: Thin; Room service appropriate? Yes  Diet effective now                   EDUCATION NEEDS:   Education needs have been addressed  Skin:  Skin Assessment: Reviewed RN Assessment  Last BM:  12/17/21  Height:  Ht Readings from Last 1 Encounters:  12/17/21 '5\' 7"'$  (1.702 m)   Weight:  Wt Readings from Last 1 Encounters:  12/17/21 (!) 148.3 kg    Ideal Body Weight:   61.4kg  BMI:  Body mass index is 51.2 kg/m.  Estimated Nutritional Needs:   Kcal:  1700-2100kcal  Protein:  90-125g  Fluid:  1700-2120m  KCandise Bowens MS, RD, LDN, CNSC See AMiON for contact information

## 2021-12-17 NOTE — Progress Notes (Addendum)
Pharmacy Antibiotic Note  [name] is a 56 y.o. female transferred from [facility] on 12/17/2021 with DFI/cellulitis.  Patient has been at [facility] since 9/22.  Pharmacy has been consulted for vancomycin dosing.  OSH Data: Vanc 9/22 >>  Invanz 9/25 >> 9/27 Unasyn 9/22 >> 9/25   9/22 Vanc 2gm load 9/23 Vanc '1250mg'$  IV 9/25 VR at 1046 = 15.1 mcg/mL (SCr 3.3, ~41 hrs from last dose) >> 1gm given 9/26 VR at 1354 = 17.8 mcg/mL (SCr 3.4) >> unsure whether '750mg'$  IV was given   9/22 BCx - 1 of 4 GP bacilli, possible contaminant   Plan: Check vancomycin random level Rocephin and Flagyl per MD  Height: '5\' 7"'$  (170.2 cm) Weight: (!) 148.3 kg (326 lb 14.4 oz) IBW/kg (Calculated) : 61.6  Temp (24hrs), Avg:98.5 F (36.9 C), Min:98.5 F (36.9 C), Max:98.5 F (36.9 C)  No results for input(s): "WBC", "CREATININE", "LATICACIDVEN", "VANCOTROUGH", "VANCOPEAK", "VANCORANDOM", "GENTTROUGH", "GENTPEAK", "GENTRANDOM", "TOBRATROUGH", "TOBRAPEAK", "TOBRARND", "AMIKACINPEAK", "AMIKACINTROU", "AMIKACIN" in the last 168 hours.  CrCl cannot be calculated (Patient's most recent lab result is older than the maximum 21 days allowed.).    Allergies  Allergen Reactions   Diphenhydramine-Acetaminophen Hives   Morphine Sulfate Other (See Comments)    SEVERE SOMNOLENCE  States just Morphine in pill form   Cephalosporins Anxiety   Bactrim [Sulfamethoxazole-Trimethoprim] Other (See Comments)    Skin peeled off   Diazepam Hives    Valium    Insulins     humulin   Latex Itching   Simvastatin Other (See Comments)    Sore Joints   Tylenol Pm Extra [Diphenhydramine-Apap (Sleep)] Hives   Ciprofloxacin Anxiety   Levofloxacin Anxiety    Vanc PTA 9/22 >> Rocephin 9/27 >> (10/3) Flagyl 9/27 >> (10/3)  Thuy D. Mina Marble, PharmD, BCPS, [place] 12/17/2021, 5:22 AM   10:30 AM - Vancomycin random level 25, no vancomycin dose today  Thank you Anette Guarneri, PharmD

## 2021-12-18 ENCOUNTER — Inpatient Hospital Stay (HOSPITAL_COMMUNITY): Payer: Medicare Other

## 2021-12-18 DIAGNOSIS — N179 Acute kidney failure, unspecified: Secondary | ICD-10-CM | POA: Diagnosis not present

## 2021-12-18 DIAGNOSIS — I5032 Chronic diastolic (congestive) heart failure: Secondary | ICD-10-CM | POA: Diagnosis not present

## 2021-12-18 DIAGNOSIS — E11621 Type 2 diabetes mellitus with foot ulcer: Secondary | ICD-10-CM

## 2021-12-18 DIAGNOSIS — E11628 Type 2 diabetes mellitus with other skin complications: Secondary | ICD-10-CM | POA: Diagnosis not present

## 2021-12-18 LAB — BASIC METABOLIC PANEL
Anion gap: 11 (ref 5–15)
BUN: 89 mg/dL — ABNORMAL HIGH (ref 6–20)
CO2: 25 mmol/L (ref 22–32)
Calcium: 9.3 mg/dL (ref 8.9–10.3)
Chloride: 98 mmol/L (ref 98–111)
Creatinine, Ser: 3.49 mg/dL — ABNORMAL HIGH (ref 0.44–1.00)
GFR, Estimated: 15 mL/min — ABNORMAL LOW (ref >60)
Glucose, Bld: 253 mg/dL — ABNORMAL HIGH (ref 70–99)
Potassium: 3.6 mmol/L (ref 3.5–5.1)
Sodium: 134 mmol/L — ABNORMAL LOW (ref 135–145)

## 2021-12-18 LAB — GLUCOSE, CAPILLARY
Glucose-Capillary: 162 mg/dL — ABNORMAL HIGH (ref 70–99)
Glucose-Capillary: 179 mg/dL — ABNORMAL HIGH (ref 70–99)
Glucose-Capillary: 237 mg/dL — ABNORMAL HIGH (ref 70–99)
Glucose-Capillary: 195 mg/dL — ABNORMAL HIGH (ref 70–99)

## 2021-12-18 LAB — CBC
HCT: 22.9 % — ABNORMAL LOW (ref 36.0–46.0)
Hemoglobin: 7.3 g/dL — ABNORMAL LOW (ref 12.0–15.0)
MCH: 28 pg (ref 26.0–34.0)
MCHC: 31.9 g/dL (ref 30.0–36.0)
MCV: 87.7 fL (ref 80.0–100.0)
Platelets: 184 10*3/uL (ref 150–400)
RBC: 2.61 MIL/uL — ABNORMAL LOW (ref 3.87–5.11)
RDW: 15.9 % — ABNORMAL HIGH (ref 11.5–15.5)
WBC: 9.6 10*3/uL (ref 4.0–10.5)
nRBC: 0.4 % — ABNORMAL HIGH (ref 0.0–0.2)

## 2021-12-18 LAB — URINALYSIS, COMPLETE (UACMP) WITH MICROSCOPIC
Bacteria, UA: NONE SEEN
Bilirubin Urine: NEGATIVE
Glucose, UA: NEGATIVE mg/dL
Hgb urine dipstick: NEGATIVE
Ketones, ur: NEGATIVE mg/dL
Leukocytes,Ua: NEGATIVE
Nitrite: NEGATIVE
Protein, ur: NEGATIVE mg/dL
Specific Gravity, Urine: 1.01 (ref 1.005–1.030)
pH: 5 (ref 5.0–8.0)

## 2021-12-18 LAB — SODIUM, URINE, RANDOM: Sodium, Ur: <10 mmol/L

## 2021-12-18 LAB — VANCOMYCIN, RANDOM: Vancomycin Rm: 20 ug/mL

## 2021-12-18 LAB — CREATININE, URINE, RANDOM: Creatinine, Urine: 75 mg/dL

## 2021-12-18 MED ORDER — VANCOMYCIN VARIABLE DOSE PER UNSTABLE RENAL FUNCTION (PHARMACIST DOSING)
Status: DC
  Filled 2021-12-18: qty 1

## 2021-12-18 MED ORDER — VANCOMYCIN HCL 750 MG/150ML IV SOLN
750.0000 mg | Freq: Once | INTRAVENOUS | Status: AC
  Administered 2021-12-18: 750 mg via INTRAVENOUS
  Filled 2021-12-18: qty 150

## 2021-12-18 NOTE — Progress Notes (Signed)
PROGRESS NOTE    [name]  WLS:[id number] DOB: Feb 25, 1966 DOA: 12/17/2021 PCP: Jacklynn Ganong, MD   Brief Narrative: No notes on file   Assessment and Plan:  Diabetic foot infection Right plantar surface wound. Patient with purulent drainage from wound. Patient follows with wound care as an outpatient. Recent x-ray and MRI imaging without evidence of osteomyelitis but with evidence of fluid collection concerning for phlegmon vs abscess. Patient is stable, without fevers or leukocytosis however with elevated ESR and CRP of 78 and 15 respectively. Patient started empirically on Vancomycin at outside hospital and transitioned to Ceftriaxone and Flagyl on admission. Recent ABIs with non-compressible  arteries. Complicated by severe CKD. Superficial culture obtained on 9/21 is significant for Cirobacter koseri and is pansensitive. No blood cultures obtained. -Podiatry recommendations: Vascular surgery consult -Vascular surgery consulted -Continue Ceftriaxone/Flagyl  AKI on CKD stage IV Baseline creatinine of about 2.8-3. Creatinine of 3.51 on day prior to admission. Torsemide held. Creatinine is stable -Continue to hold torsemide -Renal function panel  Diabetes mellitus type 2 Patient is on long term insulin. Uncontrolled with hyperglycemia. Hemoglobin A1C of 6.9% from 10/2021. Patient is prescribed Lantus 35 units BID and Humalog 5 units TID with meals in addition to a sliding scale as an outpatient. Patient started on Semglee 30 units qHS and SSI during this admission -Continue Semglee 25 units BID and continue SSI  Chronic diastolic heart failure Stable. Torsemide held secondary to AKI  Elevated LFTs AST/ALT of 364/138 respectively and trended down. Total bilirubin of 1.2. RUQ with cholelithiasis without cholecystitis and borderline CBD dilation; no associated symptoms.  CAD Patient is currently stable. She is on Effient, Ranexa, aspirin and Imdur as an  outpatient.  OSA -Continue CPAP  Chronic anemia Baseline hemoglobin of 7-8. No evidence of acute bleeding. In setting of chronic kidney disease. Stable.  DVT prophylaxis: SCDs Code Status:   Code Status: Full Code Family Communication: None at bedside Disposition Plan: Discharge pending transition to outpatient antibiotic regimen and podiatry recommendations   Consultants:  Podiatry  Procedures:  None  Antimicrobials: Ceftriaxone Flagyl    Subjective: No issues this morning.  Objective: BP (!) 133/56 (BP Location: Right Arm)   Pulse 78   Temp 98.8 F (37.1 C) (Oral)   Resp 18   Ht 5' 7"  (1.702 m)   Wt (!) 147.1 kg   SpO2 95%   BMI 50.79 kg/m   Examination:  General exam: Appears calm and comfortable Respiratory system: Clear to auscultation. Respiratory effort normal. Cardiovascular system: S1 & S2 heard, RRR. Gastrointestinal system: Abdomen is nondistended, soft and nontender. Normal bowel sounds heard. Central nervous system: Alert and oriented. No focal neurological deficits. Psychiatry: Judgement and insight appear normal. Mood & affect appropriate.    Data Reviewed: I have personally reviewed following labs and imaging studies  CBC Lab Results  Component Value Date   WBC 9.6 12/18/2021   RBC 2.61 (L) 12/18/2021   HGB 7.3 (L) 12/18/2021   HCT 22.9 (L) 12/18/2021   MCV 87.7 12/18/2021   MCH 28.0 12/18/2021   PLT 184 12/18/2021   MCHC 31.9 12/18/2021   RDW 15.9 (H) 12/18/2021   LYMPHSABS 2.4 02/24/2010   MONOABS 0.6 02/24/2010   EOSABS 0.1 02/24/2010   BASOSABS 0.0 81/15/7262     Last metabolic panel Lab Results  Component Value Date   NA 134 (L) 12/18/2021   K 3.6 12/18/2021   CL 98 12/18/2021   CO2 25 12/18/2021   BUN 89 (  H) 12/18/2021   CREATININE 3.49 (H) 12/18/2021   GLUCOSE 253 (H) 12/18/2021   GFRNONAA 15 (L) 12/18/2021   GFRAA 33 (L) 06/15/2019   CALCIUM 9.3 12/18/2021   PHOS 3.8 11/05/2021   PROT 7.7 12/17/2021    ALBUMIN 2.8 (L) 12/17/2021   LABGLOB 3.7 10/30/2021   AGRATIO 0.8 10/30/2021   BILITOT 1.2 12/17/2021   ALKPHOS 31 (L) 12/17/2021   AST 49 (H) 12/17/2021   ALT 62 (H) 12/17/2021   ANIONGAP 11 12/18/2021    GFR: Estimated Creatinine Clearance: 26.9 mL/min (A) (by C-G formula based on SCr of 3.49 mg/dL (H)).  No results found for this or any previous visit (from the past 240 hour(s)).    Radiology Studies: DG Foot Complete Right  Result Date: 12/17/2021 CLINICAL DATA:  Osteomyelitis of right foot EXAM: RIGHT FOOT COMPLETE - 3+ VIEW COMPARISON:  Right foot MRI 10/25/2021 FINDINGS: Again seen are amputations of the first metatarsal head and toe. No new cortical erosions or periosteal reaction. There is some soft tissue swelling of the forefoot. No acute fracture or dislocation. Peripheral vascular calcifications are present. IMPRESSION: 1.  Soft tissue swelling of the forefoot. 2. No radiographic evidence of osteomyelitis. If there is persistent clinical concern, consider MRI for further evaluation. 3.  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 Electronically Signed   By: Ronney Asters M.D.   On: 12/17/2021 22:07      LOS: 1 day    Cordelia Poche, MD [facility] 12/18/2021, 1:44 PM   If 7PM-7AM, please contact night-coverage [email or web address]

## 2021-12-18 NOTE — Progress Notes (Signed)
Pharmacy Antibiotic Note  [name] is a 56 y.o. female transferred from [facility] on 12/17/2021 with DFI/cellulitis.  Patient has been at [facility] since 9/22.  Pharmacy has been consulted for vancomycin dosing. Scr stable 3.39 today.  OSH Data: Vanc 9/22 >>  Invanz 9/25 >> 9/27 Unasyn 9/22 >> 9/25   9/22 Vanc 2gm load 9/23 Vanc '1250mg'$  IV 9/25 VR at 1046 = 15.1 mcg/mL (SCr 3.3, ~41 hrs from last dose) >> 1gm given 9/26 VR at 1354 = 17.8 mcg/mL (SCr 3.4) >> unsure whether '750mg'$  IV was given   9/22 BCx - 1 of 4 GP bacilli, possible contaminant  On transfer to [facility]: 9/27 VR 0820 25 - no vancomycin given 9/28 AM VR 0031 20   Plan: Give vancomycin '750mg'$  IV x 1 - recheck SCr/vancomycin random level with AM labs 9/30 to re-dose Ceftriaxone 2g IV q24h Flagyl '500mg'$  IV q12h Monitor clinical progress, c/s, renal function F/u de-escalation plan/LOT, vancomycin levels as indicated   Height: '5\' 7"'$  (170.2 cm) Weight: (!) 147.1 kg (324 lb 4.8 oz) IBW/kg (Calculated) : 61.6  Temp (24hrs), Avg:98.2 F (36.8 C), Min:97.6 F (36.4 C), Max:98.5 F (36.9 C)  Recent Labs  Lab 12/17/21 0820 12/18/21 0031  WBC 8.2 9.6  CREATININE 3.51* 3.49*  VANCORANDOM 25 20    Estimated Creatinine Clearance: 26.9 mL/min (A) (by C-G formula based on SCr of 3.49 mg/dL (H)).    Allergies  Allergen Reactions   Diphenhydramine-Acetaminophen Hives   Cephalosporins Anxiety   Bactrim [Sulfamethoxazole-Trimethoprim] Other (See Comments)    Skin peeled off   Benadryl [Diphenhydramine] Hives   Latex Itching   Ms Contin [Morphine] Other (See Comments)    Severe somnolence   Tylenol Pm Extra [Diphenhydramine-Apap (Sleep)] Hives   Valium [Diazepam] Hives   Zocor [Simvastatin] Other (See Comments)    Arthralgia    Cipro [Ciprofloxacin Hcl] Anxiety   Levaquin [Levofloxacin] Anxiety    Vanc PTA 9/22 >> Rocephin 9/27 >> (10/3) Flagyl 9/27 >> (10/3)   Arturo Morton, PharmD, BCPS Please check  AMION for all [place] contact numbers Clinical Pharmacist 12/18/2021 9:42 AM

## 2021-12-18 NOTE — TOC Initial Note (Signed)
Transition of Care [facility]) - Initial/Assessment Note    Patient Details  Name: [name] MRN: [id number] Date of Birth: 1966-03-18  Transition of Care [facility]) CM/SW Contact:    Zenon Mayo, RN Phone Number: 12/18/2021, 2:05 PM  Clinical Narrative:                 Patient is from home alone, she has a waker , and a cane.  She is active with [facility] for [name] [place] [place].  NCM offered choice, with [email or web address], she would like to continue with [facility].  NCM confirmed with Kimberley that she is active with them.  Will need orders for HHRN,HHPT, [place]. She gets her medications from [facility] in [place].  She will have transportation at dc, she weighs her self daily and checks her bp daily. She states she does not put any salt on her food.  She presents with R diabetic foot ulcer, conts on iv abx, vascular consulted, pulses not palpable per Nurse in progression.  TOC following.         Patient Goals and CMS Choice        Expected Discharge Plan and Services                                                Prior Living Arrangements/Services                       Activities of Daily Living Home Assistive Devices/Equipment: Cane (specify quad or straight) ADL Screening (condition at time of admission) Patient's cognitive ability adequate to safely complete daily activities?: Yes Is the patient deaf or have difficulty hearing?: No Does the patient have difficulty seeing, even when wearing glasses/contacts?: No Does the patient have difficulty concentrating, remembering, or making decisions?: No Patient able to express need for assistance with ADLs?: Yes Does the patient have difficulty dressing or bathing?: Yes Independently performs ADLs?: Yes (appropriate for developmental age) Does the patient have difficulty walking or climbing stairs?: Yes Weakness of Legs: Both Weakness of Arms/Hands: None  Permission Sought/Granted                   Emotional Assessment              Admission diagnosis:  Diabetic foot ulcer ([place]) [Z32.992, L97.509] Patient Active Problem List   Diagnosis Date Noted   Diabetic foot ulcer ([place]) 12/17/2021   Elevated LFTs    Diabetic infection of right foot ([place])    Cardiorenal syndrome 11/03/2021   RVF (right ventricular failure) ([place])    NSTEMI (non-ST elevated myocardial infarction) ([place]) 10/25/2021   Acute on chronic heart failure with preserved ejection fraction (HFpEF) ([place]) 10/25/2021   Acute renal failure superimposed on stage 4 chronic kidney disease ([place]) 10/25/2021   Wound of right foot 10/25/2021   Insulin dependent type 2 diabetes mellitus ([place]) 10/25/2021   Anemia of chronic disease 10/25/2021   Chest pain 10/01/2018   Screening for cervical cancer 03/14/2018   Atherosclerosis 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 ([place]) 08/12/2017   Diarrhea 05/20/2016   Controlled substance agreement signed 01/07/2016   Chronic back pain 08/25/2013   Gastroenteritis 07/31/2013   Gout 07/31/2013   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   Acute renal failure ([place]) 07/26/2013   Chronic kidney disease, stage III (moderate) (  [place]) 07/26/2013   Vomiting 07/26/2013   Medication management 05/08/2013   Dysthymia 10/28/2012   Acute myocardial infarction, subsequent episode of care [facility]) 06/27/2012   Status post percutaneous transluminal coronary angioplasty 06/27/2012   Obesity 09/12/2011   OSA (obstructive sleep apnea) 09/12/2011   CHEST PAIN 02/04/2010   Chronic ischemic heart disease 01/24/2010   Congestive heart failure ([place]) 01/24/2010   PALPITATIONS 07/22/2009   Hyperlipidemia associated with type 2 diabetes mellitus ([place]) 05/07/2009   Hypertension associated with diabetes ([place]) 05/07/2009   CAD (coronary artery disease) 05/07/2009   Chronic diastolic heart failure ([place]) 05/07/2009   Other postprocedural status(V45.89) 83/38/2505   Umbilical hernia with  gangrene 05/16/2007   PCP:  Jacklynn Ganong, MD Pharmacy:   [facility], [address] [place] [address] Phone: [phone] Fax: [phone]  [facility] ([place], [place] [facility] [address] [place] [address] Phone: [phone] Fax: [place], [place] [address] Phone: [phone] Fax: [phone]  [facility] [address] [place] [address] Phone: [phone] Fax: [phone]     Social Determinants of Health (SDOH) Interventions    Readmission Risk Interventions    12/18/2021    2:02 PM 11/05/2021    1:58 PM  Readmission Risk Prevention Plan  Transportation Screening Complete Complete  PCP or Specialist Appt within 5-7 Days  Complete  PCP or Specialist Appt within 3-5 Days Complete   Home Care Screening  Complete  Medication Review (RN CM)  Complete  HRI or Home Care Consult Complete   Social Work Consult for [place] Planning/Counseling Complete   Palliative Care Screening Not Applicable   Medication Review Press photographer) Complete
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
## 2021-12-18 NOTE — Hospital Course (Addendum)
[name] is a 56 y.o. female with a history of CAD s/p CABG, CKD 4, chronic HFpEF, type II DM, OSA.  Presented with right foot ulcer, AKI.  Vascular surgery, podiatry and nephrology following. Currently no plans for surgery.  Cultures are negative. Has progressive AKI on CKD 4 with multiple uremic symptoms.  Nephrology recommending to consider HD but patient continues to refuse as she believes her faith will help her kidneys.  Palliative care consulted. As of 10/9 patient finally agreed for HD.  IR consult for HD catheter placement.  Procedure scheduled for 10/10.

## 2021-12-18 NOTE — Progress Notes (Signed)
   12/18/21 1100  Mobility  Activity Ambulated with assistance in hallway  Level of Assistance Standby assist, set-up cues, supervision of patient - no hands on  Assistive Device Four wheel walker  Distance Ambulated (ft) 50 ft  Activity Response Tolerated well  $Mobility charge 1 Mobility   Mobility Specialist Progress Note  Pt was in bed and agreeable. X1 standing break d/t fatigue. Returned to chair w/ all needs met and call bell in reach.   Lucious Groves Mobility Specialist

## 2021-12-19 DIAGNOSIS — N179 Acute kidney failure, unspecified: Secondary | ICD-10-CM | POA: Diagnosis not present

## 2021-12-19 DIAGNOSIS — E11621 Type 2 diabetes mellitus with foot ulcer: Secondary | ICD-10-CM | POA: Diagnosis not present

## 2021-12-19 DIAGNOSIS — E11628 Type 2 diabetes mellitus with other skin complications: Secondary | ICD-10-CM | POA: Diagnosis not present

## 2021-12-19 DIAGNOSIS — I5032 Chronic diastolic (congestive) heart failure: Secondary | ICD-10-CM | POA: Diagnosis not present

## 2021-12-19 DIAGNOSIS — R21 Rash and other nonspecific skin eruption: Secondary | ICD-10-CM

## 2021-12-19 LAB — GLUCOSE, CAPILLARY
Glucose-Capillary: 141 mg/dL — ABNORMAL HIGH (ref 70–99)
Glucose-Capillary: 218 mg/dL — ABNORMAL HIGH (ref 70–99)
Glucose-Capillary: 195 mg/dL — ABNORMAL HIGH (ref 70–99)
Glucose-Capillary: 150 mg/dL — ABNORMAL HIGH (ref 70–99)

## 2021-12-19 LAB — CBC
HCT: 23 % — ABNORMAL LOW (ref 36.0–46.0)
Hemoglobin: 7.5 g/dL — ABNORMAL LOW (ref 12.0–15.0)
MCH: 28.1 pg (ref 26.0–34.0)
MCHC: 32.6 g/dL (ref 30.0–36.0)
MCV: 86.1 fL (ref 80.0–100.0)
Platelets: 177 10*3/uL (ref 150–400)
RBC: 2.67 MIL/uL — ABNORMAL LOW (ref 3.87–5.11)
RDW: 16.3 % — ABNORMAL HIGH (ref 11.5–15.5)
WBC: 9.9 10*3/uL (ref 4.0–10.5)
nRBC: 0.9 % — ABNORMAL HIGH (ref 0.0–0.2)

## 2021-12-19 LAB — RENAL FUNCTION PANEL
Albumin: 2.8 g/dL — ABNORMAL LOW (ref 3.5–5.0)
Anion gap: 16 — ABNORMAL HIGH (ref 5–15)
BUN: 85 mg/dL — ABNORMAL HIGH (ref 6–20)
CO2: 21 mmol/L — ABNORMAL LOW (ref 22–32)
Calcium: 9.2 mg/dL (ref 8.9–10.3)
Chloride: 97 mmol/L — ABNORMAL LOW (ref 98–111)
Creatinine, Ser: 3.26 mg/dL — ABNORMAL HIGH (ref 0.44–1.00)
GFR, Estimated: 16 mL/min — ABNORMAL LOW (ref >60)
Glucose, Bld: 211 mg/dL — ABNORMAL HIGH (ref 70–99)
Phosphorus: 4.1 mg/dL (ref 2.5–4.6)
Potassium: 4.4 mmol/L (ref 3.5–5.1)
Sodium: 134 mmol/L — ABNORMAL LOW (ref 135–145)

## 2021-12-19 LAB — UREA NITROGEN, URINE: Urea Nitrogen, Ur: 601 mg/dL

## 2021-12-19 MED ORDER — SODIUM CHLORIDE 0.9 % IV SOLN
1.0000 g | Freq: Three times a day (TID) | INTRAVENOUS | Status: DC
  Administered 2021-12-19 – 2021-12-23 (×12): 1 g via INTRAVENOUS
  Filled 2021-12-19 (×14): qty 5

## 2021-12-19 MED ORDER — HEPARIN SODIUM (PORCINE) 5000 UNIT/ML IJ SOLN
5000.0000 [IU] | Freq: Three times a day (TID) | INTRAMUSCULAR | Status: DC
  Administered 2021-12-19 – 2021-12-28 (×24): 5000 [IU] via SUBCUTANEOUS
  Filled 2021-12-19 (×25): qty 1

## 2021-12-19 MED ORDER — ONDANSETRON HCL 4 MG/2ML IJ SOLN
4.0000 mg | Freq: Four times a day (QID) | INTRAMUSCULAR | Status: DC | PRN
  Administered 2021-12-19 – 2022-01-02 (×4): 4 mg via INTRAVENOUS
  Filled 2021-12-19 (×4): qty 2

## 2021-12-19 MED ORDER — TRIAMCINOLONE ACETONIDE 0.1 % EX CREA
TOPICAL_CREAM | Freq: Two times a day (BID) | CUTANEOUS | Status: DC
  Administered 2021-12-25 – 2021-12-26 (×2): 1 via TOPICAL
  Filled 2021-12-19 (×8): qty 15

## 2021-12-19 MED ORDER — HYDROXYZINE HCL 25 MG PO TABS
25.0000 mg | ORAL_TABLET | Freq: Three times a day (TID) | ORAL | Status: DC | PRN
  Administered 2021-12-19 – 2022-01-05 (×16): 25 mg via ORAL
  Filled 2021-12-19 (×21): qty 1

## 2021-12-19 NOTE — Progress Notes (Signed)
Mobility Specialist Progress Note    12/19/21 1045  Mobility  Activity Ambulated with assistance in hallway  Level of Assistance Contact guard assist, steadying assist  Assistive Device Four wheel walker  Distance Ambulated (ft) 55 ft (25+30)  Activity Response Tolerated fair  $Mobility charge 1 Mobility   During Mobility: 95 HR  Pt received in chair at sink and agreeable. C/o nausea. Took x1 extended seated rest break. Returned to bed with call bell in reach.    Hildred Alamin Mobility Specialist

## 2021-12-19 NOTE — Progress Notes (Signed)
   12/19/21 1555  Assess: MEWS Score  Temp (!) 101.7 F (38.7 C)  BP (!) 107/48  MAP (mmHg) 65  ECG Heart Rate 88  Resp 18  SpO2 97 %  O2 Device Room Air  Assess: MEWS Score  MEWS Temp 2  MEWS Systolic 0  MEWS Pulse 0  MEWS RR 0  MEWS LOC 0  MEWS Score 2  MEWS Score Color Yellow  Assess: if the MEWS score is Yellow or Red  Were vital signs taken at a resting state? Yes  Focused Assessment No change from prior assessment  Does the patient meet 2 or more of the SIRS criteria? No  MEWS guidelines implemented *See Row Information* Yes  Treat  MEWS Interventions Escalated (See documentation below)  Pain Scale 0-10  Pain Score 0  Take Vital Signs  Increase Vital Sign Frequency  Yellow: Q 2hr X 2 then Q 4hr X 2, if remains yellow, continue Q 4hrs  Escalate  MEWS: Escalate Yellow: discuss with charge nurse/RN and consider discussing with provider and RRT  Notify: Charge Nurse/RN  Name of Charge Nurse/RN Notified [place], RN  Date Charge Nurse/RN Notified 12/19/21  Time Charge Nurse/RN Notified 1609  Notify: Provider  Provider Name/Title Lonny Prude, R  Date Provider Notified 12/19/21  Time Provider Notified 1610  Method of Notification  (secure chat)  Notification Reason Other (Comment) (change in VS)  Provider response No new orders  Document  Patient Outcome Not stable and remains on department  Assess: SIRS CRITERIA  SIRS Temperature  1  SIRS Pulse 0  SIRS Respirations  0  SIRS WBC 1  SIRS Score Sum  2

## 2021-12-19 NOTE — Progress Notes (Signed)
Cross Coverage   Called to bedside for progressive itchy rash.  Patient admitted Weds, started on Rocephin and Flagyl.  She noted a new itchiness that day, which has progressively gotten worse until this day it was clearly a red maculopapular rash on thighs, under breast, arm pits and neck folds.  VS WNL.   - Stop Rocephin - Start Azactam - Triamcinolone cream to rash

## 2021-12-19 NOTE — Progress Notes (Signed)
PROGRESS NOTE    [name]  BEM:[id number] DOB: 1965/08/01 DOA: 12/17/2021 PCP: Jacklynn Ganong, MD   Brief Narrative: [name] is a 56 y.o. female with a history of CAD s/p CABG and multiple stents, chronic heart failure, chronic LE wounds, CKD stage IV, diabetes mellitus, OSA. Patient presented from [facility] ED secondary to right foot ulcer with concern for infection. Empiric antibiotics initiated. Podiatry consulted.   Assessment and Plan:  Diabetic foot infection Right plantar surface wound. Patient with purulent drainage from wound. Patient follows with wound care as an outpatient. Recent x-ray and MRI imaging without evidence of osteomyelitis but with evidence of fluid collection concerning for phlegmon vs abscess. Patient is stable, without fevers or leukocytosis however with elevated ESR and CRP of 78 and 15 respectively. Patient started empirically on Vancomycin at outside hospital and transitioned to Ceftriaxone and Flagyl on admission. Recent ABIs with non-compressible  arteries. Complicated by severe CKD. Superficial culture obtained on 9/21 is significant for Cirobacter koseri and is pansensitive. No blood cultures obtained. -Podiatry recommendations: Vascular surgery consult -Vascular surgery recommendations: no intervention for now, nephrology consult, wound care -Continue Vancomycin/Ceftriaxone/Flagyl  AKI on CKD stage IV Baseline creatinine of about 2.8-3. Creatinine of 3.51 on day prior to admission. Torsemide held. Creatinine is stable -Continue to hold torsemide -BMP today  Diabetes mellitus type 2 Patient is on long term insulin. Uncontrolled with hyperglycemia. Hemoglobin A1C of 6.9% from 10/2021. Patient is prescribed Lantus 35 units BID and Humalog 5 units TID with meals in addition to a sliding scale as an outpatient. Patient started on Semglee 30 units qHS and SSI during this admission -Continue Semglee 25 units BID and continue SSI  Chronic diastolic  heart failure Stable. Torsemide held secondary to AKI  Elevated LFTs AST/ALT of 364/138 respectively and trended down. Total bilirubin of 1.2. RUQ with cholelithiasis without cholecystitis and borderline CBD dilation; no associated symptoms.  CAD Patient is currently stable. She is on Effient, Ranexa, aspirin and Imdur as an outpatient.  OSA -Continue CPAP  Chronic anemia Baseline hemoglobin of 7-8. No evidence of acute bleeding. In setting of chronic kidney disease. Stable.  DVT prophylaxis: SCDs Code Status:   Code Status: Full Code Family Communication: None at bedside Disposition Plan: Discharge pending transition to outpatient antibiotic regimen and podiatry recommendations   Consultants:  Podiatry  Procedures:  None  Antimicrobials: Ceftriaxone Flagyl    Subjective: Flagyl is making her a little nauseated.  Objective: BP (!) 131/47 (BP Location: Right Arm)   Pulse 80   Temp 99.5 F (37.5 C) (Oral)   Resp 19   Ht 5' 7"  (1.702 m)   Wt (!) 148.1 kg   SpO2 100%   BMI 51.14 kg/m   Examination:  General exam: Appears calm and comfortable Respiratory system: Clear to auscultation. Respiratory effort normal. Cardiovascular system: S1 & S2 heard, RRR. No murmurs, rubs, gallops or clicks. Gastrointestinal system: Abdomen is nondistended, soft and nontender.  Central nervous system: Alert and oriented. No focal neurological deficits. Musculoskeletal: No calf tenderness Psychiatry: Judgement and insight appear normal. Mood & affect appropriate.   Data Reviewed: I have personally reviewed following labs and imaging studies  CBC Lab Results  Component Value Date   WBC 9.9 12/19/2021   RBC 2.67 (L) 12/19/2021   HGB 7.5 (L) 12/19/2021   HCT 23.0 (L) 12/19/2021   MCV 86.1 12/19/2021   MCH 28.1 12/19/2021   PLT 177 12/19/2021   MCHC 32.6 12/19/2021   RDW 16.3 (  H) 12/19/2021   LYMPHSABS 2.4 02/24/2010   MONOABS 0.6 02/24/2010   EOSABS 0.1 02/24/2010    BASOSABS 0.0 87/19/5974     Last metabolic panel Lab Results  Component Value Date   NA 134 (L) 12/18/2021   K 3.6 12/18/2021   CL 98 12/18/2021   CO2 25 12/18/2021   BUN 89 (H) 12/18/2021   CREATININE 3.49 (H) 12/18/2021   GLUCOSE 253 (H) 12/18/2021   GFRNONAA 15 (L) 12/18/2021   GFRAA 33 (L) 06/15/2019   CALCIUM 9.3 12/18/2021   PHOS 3.8 11/05/2021   PROT 7.7 12/17/2021   ALBUMIN 2.8 (L) 12/17/2021   LABGLOB 3.7 10/30/2021   AGRATIO 0.8 10/30/2021   BILITOT 1.2 12/17/2021   ALKPHOS 31 (L) 12/17/2021   AST 49 (H) 12/17/2021   ALT 62 (H) 12/17/2021   ANIONGAP 11 12/18/2021    GFR: Estimated Creatinine Clearance: 27 mL/min (A) (by C-G formula based on SCr of 3.49 mg/dL (H)).  No results found for this or any previous visit (from the past 240 hour(s)).    Radiology Studies: US RENAL  Result Date: 12/18/2021 CLINICAL DATA:  Acute kidney injury. EXAM: RENAL / URINARY TRACT ULTRASOUND COMPLETE COMPARISON:  October 30, 2021. FINDINGS: Right Kidney: Renal measurements: 11.9 x 6.5 x 4.8 cm = volume: 192 mL. Echogenicity within normal limits. No mass or hydronephrosis visualized. Left Kidney: Renal measurements: 12.1 x 6.6 x 6.3 cm = volume: 259 mL. Echogenicity within normal limits. No mass or hydronephrosis visualized. Bladder: Not visualized due to body habitus. Other: None. IMPRESSION: Normal renal ultrasound. Electronically Signed   By: Marijo Conception M.D.   On: 12/18/2021 17:31   DG Foot Complete Right  Result Date: 12/17/2021 CLINICAL DATA:  Osteomyelitis of right foot EXAM: RIGHT FOOT COMPLETE - 3+ VIEW COMPARISON:  Right foot MRI 10/25/2021 FINDINGS: Again seen are amputations of the first metatarsal head and toe. No new cortical erosions or periosteal reaction. There is some soft tissue swelling of the forefoot. No acute fracture or dislocation. Peripheral vascular calcifications are present. IMPRESSION: 1.  Soft tissue swelling of the forefoot. 2. No radiographic evidence  of osteomyelitis. If there is persistent clinical concern, consider MRI for further evaluation. 3.  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 Electronically Signed   By: Ronney Asters M.D.   On: 12/17/2021 22:07      LOS: 2 days    Cordelia Poche, MD [facility] 12/19/2021, 9:40 AM   If 7PM-7AM, please contact night-coverage [email or web address]

## 2021-12-19 NOTE — Progress Notes (Signed)
PODIATRY PROGRESS NOTE Patient Name: [name]  DOB Nov 12, 1965 DOA 12/17/2021  Hospital Day: 3  Assessment:  56 y.o. 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  Febrile to 101.2 today, mild HTN  WBC: ESR/CRP:  72/3.6 Bcx: pending  Imaging:  XR Right foot 9/27: 1.  Soft tissue swelling of the forefoot.  2. No radiographic evidence of osteomyelitis. If there is persistent clinical concern, consider MRI for further evaluation.3.  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  Plan:  - If any further clinical concern for abscess in the right foot causing systemic infection recommend repeat MRI of the Right foot, preferably with contrast if tolerated from renal standpoint, to reassess for abscess plantar 1st met area.   - Otherwise continue local wound care per RN or WOCN - Appreciate vascular surgery recs, plan for watchful waiting and local wound care as pt not able to undergo angio due to CKD - Continue IV abx per primary/ID - Wound care: Per RN/ Absorptive dressing to be applied to the right foot daily while admitted, may benefit from WOCN consult - WB status: WBAT to RLE Will continue to follow        Everitt Amber, [place]    Subjective:  Patient seen bedside, denies much change in her right foot. Discussed that I do not feel right foot surgery warranted at this time given benign exam and she expressed thanks stating shed prefer not to have anything done surgically unless necessary.   Objective:   Vitals:   12/19/21 1107 12/19/21 1200  BP: (!) 134/54   Pulse:    Resp: 18   Temp: (!) 100.6 F (38.1 C) (!) 101.2 F (38.4 C)  SpO2: 99%        Latest Ref Rng & Units 12/19/2021   12:20 AM 12/18/2021   12:31 AM 12/17/2021    8:20 AM  CBC  WBC 4.0 -  10.5 K/uL 9.9  9.6  8.2   Hemoglobin 12.0 - 15.0 g/dL 7.5  7.3  7.2   Hematocrit 36.0 - 46.0 % 23.0  22.9  22.0   Platelets 150 - 400 K/uL 177  184  169        Latest Ref Rng & Units 12/19/2021   11:06 AM 12/18/2021   12:31 AM 12/17/2021    8:20 AM  BMP  Glucose 70 - 99 mg/dL 211  253  203   BUN 6 - 20 mg/dL 85  89  93   Creatinine 0.44 - 1.00 mg/dL 3.26  3.49  3.51   Sodium 135 - 145 mmol/L 134  134  137   Potassium 3.5 - 5.1 mmol/L 4.4  3.6  3.8   Chloride 98 - 111 mmol/L 97  98  100   CO2 22 - 32 mmol/L 21  25  24    Calcium 8.9 - 10.3 mg/dL 9.2  9.3  9.2     General: AAOx3, NAD  Lower Extremity Exam Vasc:     R - PT Non palpable, DP Non palpable. Cap refill > 3 sec to digits   Derm:    R - Wound: Located planter medial aspect of prior partial 1st ray amputation, 2 separate areas. One is pinpoint measuring approximately 0.1x0.2cm, second is a skin fissure located slight proximal to that. odor no,  drainage no. No erythema or fluctuance appreciated, tender to palpation about the wound                MSK:     R - S/p prior partial 1st ray amputation moderate edema of the right forefoot                Neuro:   R - Gross sensation diminished Gross motor function intact      Radiology:  Results reviewed. See assessment for pertinent imaging results

## 2021-12-19 NOTE — Care Management Important Message (Signed)
Important Message  Patient Details  Name: [name] MRN: [id number] Date of Birth: 1965-04-24   Medicare Important Message Given:  Yes     Dennis Hegeman Montine Circle 12/19/2021, 2:31 PM

## 2021-12-19 NOTE — Progress Notes (Signed)
Pharmacy Antibiotic Note  [name] is a 56 y.o. female transferred from [facility] on 12/17/2021 with DFI/cellulitis.  Pt known to pharmacy from vancomycin dosing. Pt now with itchy red papular rash on her thighs, belly, armpits - started on Wed with start of Rocephin/Flagyl. MD states it looks like a drug rash and assumes it is Rocephin so consulted pharmacy to change to Aztreonam to finish out course.  Plan: Recheck SCr/vancomycin random level with AM labs 9/30 to re-dose Aztreonam 2gm IV q8h Flagyl '500mg'$  IV q12h Monitor clinical progress, c/s, renal function F/u de-escalation plan/LOT, vancomycin levels as indicated   Height: '5\' 7"'$  (170.2 cm) Weight: (!) 148.1 kg (326 lb 8 oz) IBW/kg (Calculated) : 61.6  Temp (24hrs), Avg:100.2 F (37.9 C), Min:98 F (36.7 C), Max:101.7 F (38.7 C)  Recent Labs  Lab 12/17/21 0820 12/18/21 0031 12/19/21 0020 12/19/21 1106  WBC 8.2 9.6 9.9  --   CREATININE 3.51* 3.49*  --  3.26*  VANCORANDOM 25 20  --   --      Estimated Creatinine Clearance: 28.9 mL/min (A) (by C-G formula based on SCr of 3.26 mg/dL (H)).    Allergies  Allergen Reactions   Bactrim [Sulfamethoxazole-Trimethoprim] Other (See Comments)    Skin peeled off   Diphenhydramine-Acetaminophen Hives   Benadryl [Diphenhydramine] Hives   Cephalosporins Anxiety   Ms Contin [Morphine] Other (See Comments)    Severe somnolence   Tylenol Pm Extra [Diphenhydramine-Apap (Sleep)] Hives   Valium [Diazepam] Hives   Zocor [Simvastatin] Other (See Comments)    Arthralgia    Ceftriaxone Rash   Cipro [Ciprofloxacin Hcl] Anxiety   Latex Itching   Levaquin [Levofloxacin] Anxiety    Vanc PTA 9/22 >> Rocephin 9/27 >> 9/29 Flagyl 9/27 >> (10/3) Aztreonam 9/29>> (10/4)   Sherlon Handing, PharmD, BCPS Please see amion for complete clinical pharmacist phone list 12/19/2021 8:47 PM
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
## 2021-12-20 ENCOUNTER — Inpatient Hospital Stay (HOSPITAL_COMMUNITY): Payer: Medicare Other

## 2021-12-20 DIAGNOSIS — N179 Acute kidney failure, unspecified: Secondary | ICD-10-CM | POA: Diagnosis not present

## 2021-12-20 DIAGNOSIS — I5032 Chronic diastolic (congestive) heart failure: Secondary | ICD-10-CM | POA: Diagnosis not present

## 2021-12-20 DIAGNOSIS — E11628 Type 2 diabetes mellitus with other skin complications: Secondary | ICD-10-CM | POA: Diagnosis not present

## 2021-12-20 DIAGNOSIS — E11621 Type 2 diabetes mellitus with foot ulcer: Secondary | ICD-10-CM | POA: Diagnosis not present

## 2021-12-20 LAB — GLUCOSE, CAPILLARY
Glucose-Capillary: 178 mg/dL — ABNORMAL HIGH (ref 70–99)
Glucose-Capillary: 184 mg/dL — ABNORMAL HIGH (ref 70–99)
Glucose-Capillary: 194 mg/dL — ABNORMAL HIGH (ref 70–99)
Glucose-Capillary: 189 mg/dL — ABNORMAL HIGH (ref 70–99)

## 2021-12-20 LAB — BASIC METABOLIC PANEL
Anion gap: 13 (ref 5–15)
BUN: 85 mg/dL — ABNORMAL HIGH (ref 6–20)
CO2: 21 mmol/L — ABNORMAL LOW (ref 22–32)
Calcium: 9.3 mg/dL (ref 8.9–10.3)
Chloride: 101 mmol/L (ref 98–111)
Creatinine, Ser: 3.22 mg/dL — ABNORMAL HIGH (ref 0.44–1.00)
GFR, Estimated: 16 mL/min — ABNORMAL LOW (ref >60)
Glucose, Bld: 214 mg/dL — ABNORMAL HIGH (ref 70–99)
Potassium: 4.2 mmol/L (ref 3.5–5.1)
Sodium: 135 mmol/L (ref 135–145)

## 2021-12-20 LAB — VANCOMYCIN, RANDOM: Vancomycin Rm: 15 ug/mL

## 2021-12-20 MED ORDER — DICLOFENAC SODIUM 1 % EX GEL
2.0000 g | Freq: Four times a day (QID) | CUTANEOUS | Status: DC | PRN
  Filled 2021-12-20: qty 100

## 2021-12-20 MED ORDER — VANCOMYCIN HCL 750 MG/150ML IV SOLN
750.0000 mg | Freq: Once | INTRAVENOUS | Status: AC
  Administered 2021-12-20: 750 mg via INTRAVENOUS
  Filled 2021-12-20: qty 150

## 2021-12-20 MED ORDER — CYCLOBENZAPRINE HCL 5 MG PO TABS
5.0000 mg | ORAL_TABLET | Freq: Three times a day (TID) | ORAL | Status: DC | PRN
  Administered 2021-12-20 – 2021-12-22 (×4): 10 mg via ORAL
  Administered 2021-12-24: 5 mg via ORAL
  Administered 2021-12-25: 10 mg via ORAL
  Filled 2021-12-20 (×5): qty 2
  Filled 2021-12-20: qty 1

## 2021-12-20 MED ORDER — PHENOL 1.4 % MT LIQD
1.0000 | OROMUCOSAL | Status: DC | PRN
  Filled 2021-12-20: qty 177

## 2021-12-20 NOTE — Progress Notes (Signed)
Admit: 12/17/2021 LOS: 3  30F with advanced CKD4 and sig lability here with diabetic foot ulcer  Subjective:  Pruritic rash overnight, stopped ceftriaxone placed on aztreonam  Good UOP, SCr stable 3.22  09/29 0701 - 09/30 0700 In: 1250 [P.O.:840; IV Piggyback:410] Out: 1500 [Urine:1500]  Filed Weights   12/18/21 0449 12/19/21 0548 12/20/21 0500  Weight: (!) 147.1 kg (!) 148.1 kg (!) 148.1 kg    Scheduled Meds:  carvedilol  12.5 mg Oral BID WC   citalopram  20 mg Oral Daily   gabapentin  300 mg Oral QHS   heparin injection (subcutaneous)  5,000 Units Subcutaneous Q8H   insulin aspart  0-6 Units Subcutaneous TID WC   insulin glargine-yfgn  25 Units Subcutaneous BID   isosorbide mononitrate  120 mg Oral q morning   metroNIDAZOLE  500 mg Oral Q12H   Ensure Max Protein  11 oz Oral Daily   ranolazine  500 mg Oral BID   rosuvastatin  5 mg Oral Daily   triamcinolone cream   Topical BID   vancomycin variable dose per unstable renal function (pharmacist dosing)   Does not apply See admin instructions   Continuous Infusions:  aztreonam 1 g (12/20/21 0614)   PRN Meds:.acetaminophen **OR** acetaminophen, diclofenac Sodium, hydrOXYzine, ondansetron (ZOFRAN) IV  Current Labs: reviewed   Latest Reference Range & Units 12/18/21 04:09  Sodium, Urine mmol/L <10    Physical Exam:  Blood pressure 125/72, pulse 88, temperature 99.6 F (37.6 C), temperature source Oral, resp. rate 18, height '5\' 7"'$  (1.702 m), weight (!) 148.1 kg, SpO2 97 %. GEN: Chronically ill-appearing, obese, lying in bed, breathing comfortably ENT: NCAT, thick neck EYES: EOMI CV: Regular, normal S1 and S2 PULM: Coarse breath sounds bilaterally, no crackles ABD: Soft, protuberant SKIN: Lichenification and findings consistent with chronic lower extremity venous stasis, foot wound not examined EXT: Not floridly overloaded in the legs, hard to separate out to edema from her chronic changes    A CKD4 with significant  lability; BL SCr probably in 3s? Volume status likely drives much of the lability She is hypovolemic most likely now with low urine sodium; agree with holding diuretics ad cont oral hydration, no need for IVFs right now Renal ultrasound 9/28 reassuring Scheduled to be followed in our office but missed appointment High risk of progression to dialysis dependence and time; no RRT needs currently Diabetic foot ulcer, infected followed by podiatry and vascular surgery on vancomycin, aztreonam (rash on ceftriaxone), Flagyl Chronic HFpEF, does not appear to be in acute exacerbation DM 2 Hypertension Extensive CAD, history of CABG and PCI  P Cont another 24h w/o IVFs or diuretics Will need to resume diuretics prior to discharge Will continue to follow Daily weights, Daily Renal Panel, Strict I/Os, Avoid nephrotoxins (NSAIDs, judicious IV Contrast)  Medication Issues; Preferred narcotic agents for pain control are hydromorphone, fentanyl, and methadone. Morphine should not be used.  Baclofen should be avoided Avoid oral sodium phosphate and magnesium citrate based laxatives / bowel preps    Pearson Grippe MD 12/20/2021, 9:43 AM  Recent Labs  Lab 12/18/21 0031 12/19/21 1106 12/20/21 0059  NA 134* 134* 135  K 3.6 4.4 4.2  CL 98 97* 101  CO2 25 21* 21*  GLUCOSE 253* 211* 214*  BUN 89* 85* 85*  CREATININE 3.49* 3.26* 3.22*  CALCIUM 9.3 9.2 9.3  PHOS  --  4.1  --    Recent Labs  Lab 12/17/21 0820 12/18/21 0031 12/19/21 0020  WBC 8.2  9.6 9.9  HGB 7.2* 7.3* 7.5*  HCT 22.0* 22.9* 23.0*  MCV 87.0 87.7 86.1  PLT 169 184 177

## 2021-12-20 NOTE — Progress Notes (Signed)
Pt c/o of sore throat and hoarseness. Evaluated her oral cavity nothing concerning noted, ordered chloraseptic spray and will have MD evaluate in am.

## 2021-12-20 NOTE — Progress Notes (Signed)
PROGRESS NOTE    [name]  ZRA:[id number] DOB: Mar 26, 1965 DOA: 12/17/2021 PCP: Jacklynn Ganong, MD   Brief Narrative: [name] is a 56 y.o. female with a history of CAD s/p CABG and multiple stents, chronic heart failure, chronic LE wounds, CKD stage IV, diabetes mellitus, OSA. Patient presented from [facility] ED secondary to right foot ulcer with concern for infection. Empiric antibiotics initiated. Podiatry consulted.   Assessment and Plan:  Diabetic foot infection Right plantar surface wound. Patient with purulent drainage from wound. Patient follows with wound care as an outpatient. Recent x-ray and MRI imaging without evidence of osteomyelitis but with evidence of fluid collection concerning for phlegmon vs abscess. Patient is stable, without fevers or leukocytosis however with elevated ESR and CRP of 78 and 15 respectively. Patient started empirically on Vancomycin at outside hospital and transitioned to Ceftriaxone and Flagyl on admission. Recent ABIs with non-compressible  arteries. Complicated by severe CKD. Superficial culture obtained on 9/21 is significant for Citrobacter koseri and is pansensitive. Patient developed fever on 9/29. Repeat MRI (9/30) without evidence of osteomyelitis or abscess. Ceftriaxone transitioned to aztreonam secondary to rash. -Continue Vancomycin, aztreonam and Flagyl -Follow-up blood cultures  AKI on CKD stage IV Baseline creatinine of about 2.8-3. Creatinine of 3.51 on day prior to admission. Torsemide held. Creatinine is improving slowly. Creatinine of 3.22 on BMP today. -Continue to hold torsemide  Diabetes mellitus type 2 Patient is on long term insulin. Uncontrolled with hyperglycemia. Hemoglobin A1C of 6.9% from 10/2021. Patient is prescribed Lantus 35 units BID and Humalog 5 units TID with meals in addition to a sliding scale as an outpatient. Patient started on Semglee 30 units qHS and SSI during this admission -Continue Semglee 25 units  BID and continue SSI  Chronic diastolic heart failure Stable. Torsemide held secondary to AKI  Elevated LFTs AST/ALT of 364/138 respectively and trended down. Total bilirubin of 1.2. RUQ with cholelithiasis without cholecystitis and borderline CBD dilation; no associated symptoms.  CAD Patient is currently stable. She is on Effient, Ranexa, aspirin and Imdur as an outpatient.  OSA -Continue CPAP  Chronic anemia Baseline hemoglobin of 7-8. No evidence of acute bleeding. In setting of chronic kidney disease. Stable.  Rash Maculopapular rash on arms and upper legs. Presumed secondary to antibiotics. Ceftriaxone discontinued.  DVT prophylaxis: SCDs Code Status:   Code Status: Full Code Family Communication: None at bedside Disposition Plan: Discharge pending transition to outpatient antibiotic regimen and podiatry recommendations   Consultants:  Podiatry Vascular surgery Nephrology  Procedures:  None  Antimicrobials: Ceftriaxone Flagyl Aztreonam Vancomycin   Subjective: Patient reports some left sided flank pain that she attributes to a pulled muscle. She states she has had this kind of pain before. No associated dyspnea.  Objective: BP 125/72 (BP Location: Right Arm)   Pulse 88   Temp 99.6 F (37.6 C) (Oral)   Resp 18   Ht _0  (1.702 m)   Wt (!) 148.1 kg   SpO2 97%   BMI 51.14 kg/m   Examination:  General exam: Appears calm and comfortable Respiratory system: Clear to auscultation. Respiratory effort normal. Cardiovascular system: S1 & S2 heard, RRR. Gastrointestinal system: Abdomen is nondistended, soft and nontender. No organomegaly or masses felt. Normal bowel sounds heard. Central nervous system: Alert and oriented. No focal neurological deficits. Musculoskeletal: 2+ pitting edema of trunk. No calf tenderness Skin: Maculopapular rash of bilateral arms and thighs. Psychiatry: Judgement and insight appear normal. Mood & affect appropriate.   Data  Reviewed: I have personally reviewed following labs and imaging studies  CBC Lab Results  Component Value Date   WBC 9.9 12/19/2021   RBC 2.67 (L) 12/19/2021   HGB 7.5 (L) 12/19/2021   HCT 23.0 (L) 12/19/2021   MCV 86.1 12/19/2021   MCH 28.1 12/19/2021   PLT 177 12/19/2021   MCHC 32.6 12/19/2021   RDW 16.3 (H) 12/19/2021   LYMPHSABS 2.4 02/24/2010   MONOABS 0.6 02/24/2010   EOSABS 0.1 02/24/2010   BASOSABS 0.0 53/29/9242     Last metabolic panel Lab Results  Component Value Date   NA 135 12/20/2021   K 4.2 12/20/2021   CL 101 12/20/2021   CO2 21 (L) 12/20/2021   BUN 85 (H) 12/20/2021   CREATININE 3.22 (H) 12/20/2021   GLUCOSE 214 (H) 12/20/2021   GFRNONAA 16 (L) 12/20/2021   GFRAA 33 (L) 06/15/2019   CALCIUM 9.3 12/20/2021   PHOS 4.1 12/19/2021   PROT 7.7 12/17/2021   ALBUMIN 2.8 (L) 12/19/2021   LABGLOB 3.7 10/30/2021   AGRATIO 0.8 10/30/2021   BILITOT 1.2 12/17/2021   ALKPHOS 31 (L) 12/17/2021   AST 49 (H) 12/17/2021   ALT 62 (H) 12/17/2021   ANIONGAP 13 12/20/2021    GFR: Estimated Creatinine Clearance: 29.3 mL/min (A) (by C-G formula based on SCr of 3.22 mg/dL (H)).  Recent Results (from the past 240 hour(s))  Culture, blood (Routine X 2) w Reflex to ID Panel     Status: None (Preliminary result)   Collection Time: 12/19/21 12:52 PM   Specimen: BLOOD LEFT HAND  Result Value Ref Range Status   Specimen Description BLOOD LEFT HAND  Final   Special Requests   Final    BOTTLES DRAWN AEROBIC AND ANAEROBIC Blood Culture results may not be optimal due to an inadequate volume of blood received in culture bottles   Culture   Final    NO GROWTH < 24 HOURS Performed at [facility], [place] [address] [place], [place] [address]    Report Status PENDING  Incomplete  Culture, blood (Routine X 2) w Reflex to ID Panel     Status: None (Preliminary result)   Collection Time: 12/19/21 12:52 PM   Specimen: BLOOD LEFT FOREARM  Result Value Ref Range Status    Specimen Description BLOOD LEFT FOREARM  Final   Special Requests   Final    BOTTLES DRAWN AEROBIC AND ANAEROBIC Blood Culture results may not be optimal due to an inadequate volume of blood received in culture bottles   Culture   Final    NO GROWTH < 24 HOURS Performed at [facility], [place] [address] [place],  [address]    Report Status PENDING  Incomplete      Radiology Studies: MR FOOT RIGHT WO CONTRAST  Result Date: 12/20/2021 CLINICAL DATA:  Diabetes. Foot swelling. Osteomyelitis suspected. Abscess. EXAM: MRI OF THE RIGHT FOREFOOT WITHOUT CONTRAST TECHNIQUE: Multiplanar, multisequence MR imaging of the right forefoot was performed. No intravenous contrast was administered. COMPARISON:  Right foot radiographs 12/17/2021; MRI right forefoot 10/25/2021 FINDINGS: Bones/Joint/Cartilage Postsurgical changes are again seen of amputation of the great toe phalanges and medial greater than lateral aspect of the distal 1st metatarsal including the metatarsal head and medial aspect of the metaphysis. The amputation site is again sharp without residual bone marrow edema to suggest osteomyelitis. There is increased now mild-to-moderate soft tissue swelling and edema within the soft tissues just distal to the amputation site, greatest in the  plantar region and suspicious for cellulitis. The previously seen lobular superficial plantar fluid collections within the plantar medial aspect of the forefoot at the level of the proximal and distal aspects of the great toe metatarsal shaft are no longer visualized. Mild thinning of the reason all plantar subcutaneous fat where these fluid collections previously were located. There is decreased T1 and decreased T2 signal scarring within the plantar medial forefoot again just distal to the tip of the abandoned flexor hallucis longus tendon, where previously fluid contact in the plantar aspect of the tendon on the prior 10/25/2021 MRI. Moderate diffuse  tarsometatarsal cartilage thinning, joint space narrowing, and peripheral osteophytosis. a similar cartilage thinning and joint space narrowing at the navicular-intermediate cuneiform articulation with moderate cuneiform side subchondral degenerative cystic change, similar to prior. No cortical erosion is seen to indicate acute osteomyelitis. Ligaments The Lisfranc ligament complex is intact. Collateral ligaments within the second through fifth metatarsophalangeal joints appear intact. Muscles and Tendons Interval increase in now moderate diffuse midfoot muscle edema. Minimal fluid within the flexor hallucis longus tendon sheath just proximal to the knot of Henry (series 5, image 1). Soft tissues Moderate sign and fluid bright signal throughout the dorsal foot subcutaneous fat, superficial to the extensor tendons, appears unchanged from prior. IMPRESSION: Compared to 10/25/2021: 1. Postsurgical changes of amputation of the great toe phalanges and medial greater than lateral aspect of the distal 1st metatarsal. Increased now mild-to-moderate soft tissue swelling and edema just distal to the amputation site, greatest in the plantar region and suspicious for cellulitis. No definite cortical erosion or marrow edema to indicate acute osteomyelitis. 2. The previously seen lobular superficial plantar fluid collections (previously thought to reflect skin blistering) within the plantar medial aspect of the forefoot at the level of the proximal and distal aspects of the great toe metatarsal shaft are no longer visualized. There is scarring within the plantar medial forefoot just distal to the tip of the abandoned flexor hallucis longus tendon, where previously fluid contacted the plantar aspect of the tendon on the prior 10/25/2021 MRI. 3. Interval increase in now moderate diffuse midfoot muscle edema. 4. Unchanged moderate swelling and fluid signal throughout the subcutaneous fat of the dorsal foot. Electronically Signed    By: Yvonne Kendall M.D.   On: 12/20/2021 12:04   US RENAL  Result Date: 12/18/2021 CLINICAL DATA:  Acute kidney injury. EXAM: RENAL / URINARY TRACT ULTRASOUND COMPLETE COMPARISON:  October 30, 2021. FINDINGS: Right Kidney: Renal measurements: 11.9 x 6.5 x 4.8 cm = volume: 192 mL. Echogenicity within normal limits. No mass or hydronephrosis visualized. Left Kidney: Renal measurements: 12.1 x 6.6 x 6.3 cm = volume: 259 mL. Echogenicity within normal limits. No mass or hydronephrosis visualized. Bladder: Not visualized due to body habitus. Other: None. IMPRESSION: Normal renal ultrasound. Electronically Signed   By: Marijo Conception M.D.   On: 12/18/2021 17:31      LOS: 3 days    Cordelia Poche, MD [facility] 12/20/2021, 1:38 PM   If 7PM-7AM, please contact night-coverage [email or web address]

## 2021-12-20 NOTE — Progress Notes (Signed)
   12/20/21 1000  Mobility  Activity Dangled on edge of bed  Level of Assistance Minimal assist, patient does 75% or more  Assistive Device None  Activity Response Tolerated well  $Mobility charge 1 Mobility   Mobility Specialist Progress Note  Pt was in bed and agreeable. Mobility cut short d/t c/o side pains. Returned to bed w/ all needs met and call bell in reach. Rn notified   Lucious Groves Mobility Specialist

## 2021-12-20 NOTE — Progress Notes (Addendum)
Pharmacy Antibiotic Note  [name] is a 56 y.o. female transferred from [facility] on 12/17/2021 with DFI/cellulitis. Patient is CKD4 with risk of progression to dialysis dependence. Patient on vancomycin since 9/22. Pt now with itchy red papular rash on her thighs, belly, armpits - started on Wed with start of Rocephin/Flagyl. MD states it looks like a drug rash and assumes it is Rocephin so consulted pharmacy to change to Aztreonam to finish out course. Baseline Scr about 3.0.   9/30- Vancomycin random level of 15 9/29 AM. WBC 9.9 on 9/29. Tmax 101.7. Scr 3.22 approaching baseline but still elevated.   Plan: Vancomycin 750 mg IV x 1 9/30, will check vancomycin random in 48 hours  Aztreonam 1 gm IV q8h  Flagyl '500mg'$  PO q12h per MD  Monitor clinical progress, c/s, renal function F/u de-escalation plan/LOT, vancomycin levels as indicated  Height: '5\' 7"'$  (170.2 cm) Weight: (!) 148.1 kg (326 lb 8 oz) IBW/kg (Calculated) : 61.6  Temp (24hrs), Avg:100.6 F (38.1 C), Min:99.6 F (37.6 C), Max:101.7 F (38.7 C)  Recent Labs  Lab 12/17/21 0820 12/18/21 0031 12/19/21 0020 12/19/21 1106 12/20/21 0059  WBC 8.2 9.6 9.9  --   --   CREATININE 3.51* 3.49*  --  3.26* 3.22*  VANCORANDOM 25 20  --   --  15     Estimated Creatinine Clearance: 29.3 mL/min (A) (by C-G formula based on SCr of 3.22 mg/dL (H)).    Allergies  Allergen Reactions   Bactrim [Sulfamethoxazole-Trimethoprim] Other (See Comments)    Skin peeled off   Diphenhydramine-Acetaminophen Hives   Benadryl [Diphenhydramine] Hives   Cephalosporins Anxiety   Ms Contin [Morphine] Other (See Comments)    Severe somnolence   Tylenol Pm Extra [Diphenhydramine-Apap (Sleep)] Hives   Valium [Diazepam] Hives   Zocor [Simvastatin] Other (See Comments)    Arthralgia    Ceftriaxone Rash   Cipro [Ciprofloxacin Hcl] Anxiety   Latex Itching   Levaquin [Levofloxacin] Anxiety    Vanc PTA 9/22 >> Rocephin 9/27 >> 9/29 Flagyl 9/27  >> (10/3) Aztreonam 9/29>> (10/4)  Microbiology results: 9/21 surface Cx at [facility] - citrobacter koseri  9/29 Bcx at [facility]- sent   Eliseo Gum, PharmD PGY1 Pharmacy Resident   12/20/2021  11:33 AM

## 2021-12-21 DIAGNOSIS — N179 Acute kidney failure, unspecified: Secondary | ICD-10-CM | POA: Diagnosis not present

## 2021-12-21 DIAGNOSIS — I5032 Chronic diastolic (congestive) heart failure: Secondary | ICD-10-CM | POA: Diagnosis not present

## 2021-12-21 DIAGNOSIS — E11628 Type 2 diabetes mellitus with other skin complications: Secondary | ICD-10-CM | POA: Diagnosis not present

## 2021-12-21 DIAGNOSIS — E11621 Type 2 diabetes mellitus with foot ulcer: Secondary | ICD-10-CM | POA: Diagnosis not present

## 2021-12-21 LAB — GLUCOSE, CAPILLARY
Glucose-Capillary: 140 mg/dL — ABNORMAL HIGH (ref 70–99)
Glucose-Capillary: 126 mg/dL — ABNORMAL HIGH (ref 70–99)
Glucose-Capillary: 150 mg/dL — ABNORMAL HIGH (ref 70–99)
Glucose-Capillary: 155 mg/dL — ABNORMAL HIGH (ref 70–99)

## 2021-12-21 LAB — SARS CORONAVIRUS 2 BY RT PCR: SARS Coronavirus 2 by RT PCR: NEGATIVE

## 2021-12-21 LAB — BASIC METABOLIC PANEL
Anion gap: 14 (ref 5–15)
BUN: 90 mg/dL — ABNORMAL HIGH (ref 6–20)
CO2: 22 mmol/L (ref 22–32)
Calcium: 9.4 mg/dL (ref 8.9–10.3)
Chloride: 98 mmol/L (ref 98–111)
Creatinine, Ser: 3.67 mg/dL — ABNORMAL HIGH (ref 0.44–1.00)
GFR, Estimated: 14 mL/min — ABNORMAL LOW (ref >60)
Glucose, Bld: 148 mg/dL — ABNORMAL HIGH (ref 70–99)
Potassium: 4 mmol/L (ref 3.5–5.1)
Sodium: 134 mmol/L — ABNORMAL LOW (ref 135–145)

## 2021-12-21 MED ORDER — ISOSORBIDE MONONITRATE ER 60 MG PO TB24
60.0000 mg | ORAL_TABLET | Freq: Every morning | ORAL | Status: DC
  Administered 2021-12-22 – 2022-01-06 (×14): 60 mg via ORAL
  Filled 2021-12-21 (×16): qty 1

## 2021-12-21 MED ORDER — LACTATED RINGERS IV SOLN: INTRAVENOUS | Status: DC

## 2021-12-21 MED ORDER — MENTHOL 3 MG MT LOZG
1.0000 | LOZENGE | OROMUCOSAL | Status: DC | PRN
  Administered 2021-12-21: 3 mg via ORAL
  Filled 2021-12-21: qty 9

## 2021-12-21 NOTE — Progress Notes (Signed)
Admit: 12/17/2021 LOS: 4  82F with advanced CKD4 and sig lability here with diabetic foot ulcer  Subjective:  She has a sore throat SCr worsend to 3.7, 1L UOP Poor PO 2/2 pain with swallowing  09/30 0701 - 10/01 0700 In: 697.2 [P.O.:440; IV Piggyback:257.2] Out: 1000 [Urine:1000]  Filed Weights   12/19/21 0548 12/20/21 0500 12/21/21 0507  Weight: (!) 148.1 kg (!) 148.1 kg (!) 151.1 kg    Scheduled Meds:  carvedilol  12.5 mg Oral BID WC   citalopram  20 mg Oral Daily   gabapentin  300 mg Oral QHS   heparin injection (subcutaneous)  5,000 Units Subcutaneous Q8H   insulin aspart  0-6 Units Subcutaneous TID WC   insulin glargine-yfgn  25 Units Subcutaneous BID   isosorbide mononitrate  120 mg Oral q morning   metroNIDAZOLE  500 mg Oral Q12H   Ensure Max Protein  11 oz Oral Daily   ranolazine  500 mg Oral BID   rosuvastatin  5 mg Oral Daily   triamcinolone cream   Topical BID   vancomycin variable dose per unstable renal function (pharmacist dosing)   Does not apply See admin instructions   Continuous Infusions:  aztreonam 200 mL/hr at 12/21/21 0500   lactated ringers     PRN Meds:.acetaminophen **OR** acetaminophen, cyclobenzaprine, diclofenac Sodium, hydrOXYzine, ondansetron (ZOFRAN) IV, phenol  Current Labs: reviewed   Latest Reference Range & Units 12/18/21 04:09  Sodium, Urine mmol/L <10    Physical Exam:  Blood pressure (!) 109/49, pulse 80, temperature 98.6 F (37 C), temperature source Oral, resp. rate 16, height '5\' 7"'$  (1.702 m), weight (!) 151.1 kg, SpO2 95 %. GEN: Chronically ill-appearing, obese, lying in bed, breathing comfortably ENT: NCAT, thick neck EYES: EOMI CV: Regular, normal S1 and S2 PULM: Coarse breath sounds bilaterally, no crackles ABD: Soft, protuberant SKIN: Lichenification and findings consistent with chronic lower extremity venous stasis, foot wound not examined EXT: Not floridly overloaded in the legs, hard to separate out to edema from her  chronic changes    A CKD4 with significant lability; BL SCr probably in 3s? Volume status likely drives much of the lability Hypovolemic at presentation; diuretics on hold Renal ultrasound 9/28 reassuring Scheduled to be followed in our office but missed appointment High risk of progression to dialysis dependence and time; no RRT needs currently Diabetic foot ulcer, infected followed by podiatry and vascular surgery on vancomycin, aztreonam (rash on ceftriaxone), Flagyl Chronic HFpEF, does not appear to be in acute exacerbation DM 2 Hypertension Extensive CAD, history of CABG and PCI  P With poor PO start LR mIVFs today given worsening in SCr Will need to resume diuretics prior to discharge; but now consider her hypovolemic and poor PO Will continue to follow Daily weights, Daily Renal Panel, Strict I/Os, Avoid nephrotoxins (NSAIDs, judicious IV Contrast)  Medication Issues; Preferred narcotic agents for pain control are hydromorphone, fentanyl, and methadone. Morphine should not be used.  Baclofen should be avoided Avoid oral sodium phosphate and magnesium citrate based laxatives / bowel preps    Pearson Grippe MD 12/21/2021, 9:30 AM  Recent Labs  Lab 12/19/21 1106 12/20/21 0059 12/21/21 0302  NA 134* 135 134*  K 4.4 4.2 4.0  CL 97* 101 98  CO2 21* 21* 22  GLUCOSE 211* 214* 148*  BUN 85* 85* 90*  CREATININE 3.26* 3.22* 3.67*  CALCIUM 9.2 9.3 9.4  PHOS 4.1  --   --     Recent Labs  Lab 12/17/21  0820 12/18/21 0031 12/19/21 0020  WBC 8.2 9.6 9.9  HGB 7.2* 7.3* 7.5*  HCT 22.0* 22.9* 23.0*  MCV 87.0 87.7 86.1  PLT 169 184 177

## 2021-12-21 NOTE — Progress Notes (Signed)
PODIATRY PROGRESS NOTE Patient Name: [name]  DOB 03-04-1966 DOA 12/17/2021  Hospital Day: 5  Assessment:  56 y.o. 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  Currently AF, tmax 100.8 last 24 hr  WBC: 9.9 ESR/CRP:  72/3.6 Bcx: NGTD Wcx: Rare pan sensitive C. Koseri, Rare Corynebacterium sp  Imaging:  XR Right foot 9/27: 1.  Soft tissue swelling of the forefoot.  2. No radiographic evidence of osteomyelitis. If there is persistent clinical concern, consider MRI for further evaluation.3.  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  MRI R foot WO contras 9/30: 1. Postsurgical changes of amputation of the great toe phalanges and medial greater than lateral aspect of the distal 1st metatarsal. Increased now mild-to-moderate soft tissue swelling and edema just distal to the amputation site, greatest in the plantar region and suspicious for cellulitis. No definite cortical erosion or marrow edema to indicate acute osteomyelitis. 2. The previously seen lobular superficial plantar fluid collections (previously thought to reflect skin blistering) within the plantar medial aspect of the forefoot at the level of the proximal and distal aspects of the great toe metatarsal shaft are no longer visualized. There is scarring within the plantar medial forefoot just distal to the tip of the abandoned flexor hallucis longus tendon, where previously fluid contacted the plantar aspect of the tendon on the prior 10/25/2021 MRI. 3. Interval increase in now moderate diffuse midfoot muscle edema. 4. Unchanged moderate swelling and fluid signal throughout the subcutaneous fat of the dorsal foot.   Plan:  - MRI right foot reviewed, no evidence OM or abscess in the plantar medial right forefoot.  -  Appreciate vascular surgery recs, plan for watchful waiting and local wound care as pt not able to undergo angio due to CKD - Continue abx per primary/ID - Wound care: Per RN/ Absorptive dressing to be applied to the right foot daily while admitted, follow up with wound care center upon discharge.  - WB status: WBAT to RLE  -Podiatry to sign off at this time, please contact us for any further questions or concerns regarding the right foot.        [name], DPM [facility]    Subjective:  Patient seen bedside, discussed MRI right foot results negative for bone infection or abscess. Her primary complaint today is related to hoarseness of her voice and sore throat.  Aware of plans for continued wound care to right foot.   Objective:   Vitals:   12/21/21 0507 12/21/21 0741  BP: (!) 110/48 (!) 109/49  Pulse: 80 80  Resp: 18 16  Temp: 98.6 F (37 C) 98.6 F (37 C)  SpO2: 96% 95%       Latest Ref Rng & Units 12/19/2021   12:20 AM 12/18/2021   12:31 AM 12/17/2021    8:20 AM  CBC  WBC 4.0 - 10.5 K/uL 9.9  9.6  8.2   Hemoglobin 12.0 - 15.0 g/dL 7.5  7.3  7.2   Hematocrit 36.0 - 46.0 % 23.0  22.9  22.0   Platelets 150 - 400 K/uL 177  184  169        Latest Ref Rng & Units 12/21/2021    3:02 AM 12/20/2021   12:59 AM 12/19/2021   11:06 AM  BMP  Glucose 70 - 99 mg/dL  148  214  211   BUN 6 - 20 mg/dL 90  85  85   Creatinine 0.44 - 1.00 mg/dL 3.67  3.22  3.26   Sodium 135 - 145 mmol/L 134  135  134   Potassium 3.5 - 5.1 mmol/L 4.0  4.2  4.4   Chloride 98 - 111 mmol/L 98  101  97   CO2 22 - 32 mmol/L _0 Calcium 8.9 - 10.3 mg/dL 9.4  9.3  9.2     General: AAOx3, NAD  Lower Extremity Exam Vasc:     R - PT Non palpable, DP Non palpable. Cap refill > 3 sec to digits   Derm:    R - Wound: Located planter medial aspect of prior partial 1st ray amputation, 2 separate areas. One is pinpoint measuring approximately 0.1x0.2cm, second is a skin fissure  located slight proximal to that. odor no, drainage no. No erythema or fluctuance appreciated, tender to palpation about the wound         MSK:     R - S/p prior partial 1st ray amputation moderate edema of the right forefoot                Neuro:   R - Gross sensation diminished Gross motor function intact      Radiology:  Results reviewed. See assessment for pertinent imaging results

## 2021-12-21 NOTE — Progress Notes (Signed)
PROGRESS NOTE    [name]  ZHG:[id number] DOB: 11-26-1965 DOA: 12/17/2021 PCP: Jacklynn Ganong, MD   Brief Narrative: [name] is a 56 y.o. female with a history of CAD s/p CABG and multiple stents, chronic heart failure, chronic LE wounds, CKD stage IV, diabetes mellitus, OSA. Patient presented from [facility] ED secondary to right foot ulcer with concern for infection. Empiric antibiotics initiated. Podiatry consulted.   Assessment and Plan:  Diabetic foot infection Right plantar surface wound. Patient with purulent drainage from wound. Patient follows with wound care as an outpatient. Recent x-ray and MRI imaging without evidence of osteomyelitis but with evidence of fluid collection concerning for phlegmon vs abscess. Patient is stable, without fevers or leukocytosis however with elevated ESR and CRP of 78 and 15 respectively. Patient started empirically on Vancomycin at outside hospital and transitioned to Ceftriaxone and Flagyl on admission. Recent ABIs with non-compressible  arteries. Complicated by severe CKD. Superficial culture obtained on 9/21 is significant for Citrobacter koseri and is pansensitive. Patient developed fever on 9/29. Repeat MRI (9/30) without evidence of osteomyelitis or abscess. Ceftriaxone transitioned to aztreonam secondary to rash. -Continue Vancomycin, aztreonam -Discontinue Flagyl -Follow-up blood cultures  AKI on CKD stage IV Baseline creatinine of about 2.8-3. Creatinine of 3.51 on day prior to admission. Torsemide held. Creatinine is improving slowly. Creatinine worsened to 3.67 today from 3.22. -Continue to hold torsemide -Nephrology recommendations: IV fluids  Fever Uncertain etiology. Presumed secondary to foot infection. Blood cultures obtained and are pending. Now with a sore throat -Check COVID-19 pcr; if negative, will obtain an RVP  Diabetes mellitus type 2 Patient is on long term insulin. Uncontrolled with hyperglycemia. Hemoglobin  A1C of 6.9% from 10/2021. Patient is prescribed Lantus 35 units BID and Humalog 5 units TID with meals in addition to a sliding scale as an outpatient. Patient started on Semglee 30 units qHS and SSI during this admission -Continue Semglee 25 units BID and continue SSI  Chronic diastolic heart failure Stable. Torsemide held secondary to AKI  Elevated LFTs AST/ALT of 364/138 respectively and trended down. Total bilirubin of 1.2. RUQ with cholelithiasis without cholecystitis and borderline CBD dilation; no associated symptoms.  CAD Patient is currently stable. She is on Effient, Ranexa, aspirin and Imdur as an outpatient.  OSA -Continue CPAP  Chronic anemia Baseline hemoglobin of 7-8. No evidence of acute bleeding. In setting of chronic kidney disease. Stable.  Rash Maculopapular rash on arms and upper legs. Presumed secondary to antibiotics. Ceftriaxone discontinued. -Discontinue Flagyl  DVT prophylaxis: SCDs Code Status:   Code Status: Full Code Family Communication: None at bedside Disposition Plan: Discharge pending transition to outpatient antibiotic regimen and podiatry recommendations   Consultants:  Podiatry Vascular surgery Nephrology  Procedures:  None  Antimicrobials: Ceftriaxone Flagyl Aztreonam Vancomycin   Subjective: Patient reports a bad sore throat with some pain with swallowing. No other symptoms at this time.  Objective: BP (!) 109/49 (BP Location: Right Arm)   Pulse 80   Temp 98.6 F (37 C) (Oral)   Resp 16   Ht 5' 7"  (1.702 m)   Wt (!) 151.1 kg Comment: pt didnt want to stand  SpO2 95%   BMI 52.17 kg/m   Examination:  General exam: Appears calm and comfortable HEENT: could not fully visualize the posterior pharynx but no erythema or exudate seen in oral cavity Respiratory system: Clear to auscultation. Respiratory effort normal. Cardiovascular system: S1 & S2 heard, RRR. Gastrointestinal system: Abdomen is nondistended, soft and  nontender. Normal bowel sounds heard. Central nervous system: Alert and oriented. Bilateral asterixis Musculoskeletal: No calf tenderness Skin: No cyanosis. No rashes Psychiatry: Judgement and insight appear normal. Mood & affect appropriate.   Data Reviewed: I have personally reviewed following labs and imaging studies  CBC Lab Results  Component Value Date   WBC 9.9 12/19/2021   RBC 2.67 (L) 12/19/2021   HGB 7.5 (L) 12/19/2021   HCT 23.0 (L) 12/19/2021   MCV 86.1 12/19/2021   MCH 28.1 12/19/2021   PLT 177 12/19/2021   MCHC 32.6 12/19/2021   RDW 16.3 (H) 12/19/2021   LYMPHSABS 2.4 02/24/2010   MONOABS 0.6 02/24/2010   EOSABS 0.1 02/24/2010   BASOSABS 0.0 40/81/4481     Last metabolic panel Lab Results  Component Value Date   NA 134 (L) 12/21/2021   K 4.0 12/21/2021   CL 98 12/21/2021   CO2 22 12/21/2021   BUN 90 (H) 12/21/2021   CREATININE 3.67 (H) 12/21/2021   GLUCOSE 148 (H) 12/21/2021   GFRNONAA 14 (L) 12/21/2021   GFRAA 33 (L) 06/15/2019   CALCIUM 9.4 12/21/2021   PHOS 4.1 12/19/2021   PROT 7.7 12/17/2021   ALBUMIN 2.8 (L) 12/19/2021   LABGLOB 3.7 10/30/2021   AGRATIO 0.8 10/30/2021   BILITOT 1.2 12/17/2021   ALKPHOS 31 (L) 12/17/2021   AST 49 (H) 12/17/2021   ALT 62 (H) 12/17/2021   ANIONGAP 14 12/21/2021    GFR: Estimated Creatinine Clearance: 26 mL/min (A) (by C-G formula based on SCr of 3.67 mg/dL (H)).  Recent Results (from the past 240 hour(s))  Culture, blood (Routine X 2) w Reflex to ID Panel     Status: None (Preliminary result)   Collection Time: 12/19/21 12:52 PM   Specimen: BLOOD LEFT HAND  Result Value Ref Range Status   Specimen Description BLOOD LEFT HAND  Final   Special Requests   Final    BOTTLES DRAWN AEROBIC AND ANAEROBIC Blood Culture results may not be optimal due to an inadequate volume of blood received in culture bottles   Culture   Final    NO GROWTH < 24 HOURS Performed at [facility], [place] [address]  [place], [place] [address]    Report Status PENDING  Incomplete  Culture, blood (Routine X 2) w Reflex to ID Panel     Status: None (Preliminary result)   Collection Time: 12/19/21 12:52 PM   Specimen: BLOOD LEFT FOREARM  Result Value Ref Range Status   Specimen Description BLOOD LEFT FOREARM  Final   Special Requests   Final    BOTTLES DRAWN AEROBIC AND ANAEROBIC Blood Culture results may not be optimal due to an inadequate volume of blood received in culture bottles   Culture   Final    NO GROWTH < 24 HOURS Performed at [facility], [place] [address], [place], [place] [address]    Report Status PENDING  Incomplete      Radiology Studies: MR FOOT RIGHT WO CONTRAST  Result Date: 12/20/2021 CLINICAL DATA:  Diabetes. Foot swelling. Osteomyelitis suspected. Abscess. EXAM: MRI OF THE RIGHT FOREFOOT WITHOUT CONTRAST TECHNIQUE: Multiplanar, multisequence MR imaging of the right forefoot was performed. No intravenous contrast was administered. COMPARISON:  Right foot radiographs 12/17/2021; MRI right forefoot 10/25/2021 FINDINGS: Bones/Joint/Cartilage Postsurgical changes are again seen of amputation of the great toe phalanges and medial greater than lateral aspect of the distal 1st metatarsal including the metatarsal head and medial aspect of the metaphysis. The amputation  site is again sharp without residual bone marrow edema to suggest osteomyelitis. There is increased now mild-to-moderate soft tissue swelling and edema within the soft tissues just distal to the amputation site, greatest in the plantar region and suspicious for cellulitis. The previously seen lobular superficial plantar fluid collections within the plantar medial aspect of the forefoot at the level of the proximal and distal aspects of the great toe metatarsal shaft are no longer visualized. Mild thinning of the reason all plantar subcutaneous fat where these fluid collections previously were located. There is decreased T1 and  decreased T2 signal scarring within the plantar medial forefoot again just distal to the tip of the abandoned flexor hallucis longus tendon, where previously fluid contact in the plantar aspect of the tendon on the prior 10/25/2021 MRI. Moderate diffuse tarsometatarsal cartilage thinning, joint space narrowing, and peripheral osteophytosis. a similar cartilage thinning and joint space narrowing at the navicular-intermediate cuneiform articulation with moderate cuneiform side subchondral degenerative cystic change, similar to prior. No cortical erosion is seen to indicate acute osteomyelitis. Ligaments The Lisfranc ligament complex is intact. Collateral ligaments within the second through fifth metatarsophalangeal joints appear intact. Muscles and Tendons Interval increase in now moderate diffuse midfoot muscle edema. Minimal fluid within the flexor hallucis longus tendon sheath just proximal to the knot of Henry (series 5, image 1). Soft tissues Moderate sign and fluid bright signal throughout the dorsal foot subcutaneous fat, superficial to the extensor tendons, appears unchanged from prior. IMPRESSION: Compared to 10/25/2021: 1. Postsurgical changes of amputation of the great toe phalanges and medial greater than lateral aspect of the distal 1st metatarsal. Increased now mild-to-moderate soft tissue swelling and edema just distal to the amputation site, greatest in the plantar region and suspicious for cellulitis. No definite cortical erosion or marrow edema to indicate acute osteomyelitis. 2. The previously seen lobular superficial plantar fluid collections (previously thought to reflect skin blistering) within the plantar medial aspect of the forefoot at the level of the proximal and distal aspects of the great toe metatarsal shaft are no longer visualized. There is scarring within the plantar medial forefoot just distal to the tip of the abandoned flexor hallucis longus tendon, where previously fluid contacted  the plantar aspect of the tendon on the prior 10/25/2021 MRI. 3. Interval increase in now moderate diffuse midfoot muscle edema. 4. Unchanged moderate swelling and fluid signal throughout the subcutaneous fat of the dorsal foot. Electronically Signed   By: Yvonne Kendall M.D.   On: 12/20/2021 12:04      LOS: 4 days    Cordelia Poche, MD [facility] 12/21/2021, 11:24 AM   If 7PM-7AM, please contact night-coverage [email or web address]

## 2021-12-22 ENCOUNTER — Inpatient Hospital Stay (HOSPITAL_COMMUNITY): Payer: Medicare Other

## 2021-12-22 DIAGNOSIS — I5032 Chronic diastolic (congestive) heart failure: Secondary | ICD-10-CM | POA: Diagnosis not present

## 2021-12-22 DIAGNOSIS — E11628 Type 2 diabetes mellitus with other skin complications: Secondary | ICD-10-CM | POA: Diagnosis not present

## 2021-12-22 DIAGNOSIS — N179 Acute kidney failure, unspecified: Secondary | ICD-10-CM | POA: Diagnosis not present

## 2021-12-22 DIAGNOSIS — E11621 Type 2 diabetes mellitus with foot ulcer: Secondary | ICD-10-CM | POA: Diagnosis not present

## 2021-12-22 LAB — CBC
HCT: 25.2 % — ABNORMAL LOW (ref 36.0–46.0)
Hemoglobin: 8.1 g/dL — ABNORMAL LOW (ref 12.0–15.0)
MCH: 27.6 pg (ref 26.0–34.0)
MCHC: 32.1 g/dL (ref 30.0–36.0)
MCV: 85.7 fL (ref 80.0–100.0)
Platelets: 193 10*3/uL (ref 150–400)
RBC: 2.94 MIL/uL — ABNORMAL LOW (ref 3.87–5.11)
RDW: 16.1 % — ABNORMAL HIGH (ref 11.5–15.5)
WBC: 9.9 10*3/uL (ref 4.0–10.5)
nRBC: 1.3 % — ABNORMAL HIGH (ref 0.0–0.2)

## 2021-12-22 LAB — BASIC METABOLIC PANEL
Anion gap: 16 — ABNORMAL HIGH (ref 5–15)
BUN: 99 mg/dL — ABNORMAL HIGH (ref 6–20)
CO2: 19 mmol/L — ABNORMAL LOW (ref 22–32)
Calcium: 9.2 mg/dL (ref 8.9–10.3)
Chloride: 99 mmol/L (ref 98–111)
Creatinine, Ser: 3.95 mg/dL — ABNORMAL HIGH (ref 0.44–1.00)
GFR, Estimated: 13 mL/min — ABNORMAL LOW (ref >60)
Glucose, Bld: 168 mg/dL — ABNORMAL HIGH (ref 70–99)
Potassium: 3.7 mmol/L (ref 3.5–5.1)
Sodium: 134 mmol/L — ABNORMAL LOW (ref 135–145)

## 2021-12-22 LAB — GLUCOSE, CAPILLARY
Glucose-Capillary: 182 mg/dL — ABNORMAL HIGH (ref 70–99)
Glucose-Capillary: 161 mg/dL — ABNORMAL HIGH (ref 70–99)
Glucose-Capillary: 136 mg/dL — ABNORMAL HIGH (ref 70–99)

## 2021-12-22 MED ORDER — ASPIRIN 81 MG PO TBEC
81.0000 mg | DELAYED_RELEASE_TABLET | Freq: Every day | ORAL | Status: DC
  Administered 2021-12-22 – 2021-12-27 (×6): 81 mg via ORAL
  Filled 2021-12-22 (×6): qty 1

## 2021-12-22 MED ORDER — DOXYCYCLINE HYCLATE 100 MG PO TABS
100.0000 mg | ORAL_TABLET | Freq: Two times a day (BID) | ORAL | Status: DC
  Administered 2021-12-22 – 2021-12-23 (×3): 100 mg via ORAL
  Filled 2021-12-22 (×3): qty 1

## 2021-12-22 NOTE — Progress Notes (Addendum)
Patient ID: [name], female   DOB: 31-Oct-1965, 56 y.o.   MRN: [id number] [facility] Progress Note   Assessment/ Plan:   1. Acute kidney Injury on chronic kidney disease stage IV (baseline creatinine appears to be in the 3s): On admission suspected to be hypovolemic diuretics were held.  Over the last 48 hours with worsening renal function for which she was started on intravenous fluids without notable improvement.  She does not have any acute indications for dialysis at this time and I will extend the duration of intravenous fluids 2.  Diabetic foot ulcer: Evaluated earlier by vascular surgery who are appropriately deferring lower extremity arteriogram in the setting of worsening renal function and currently ongoing local wound care and antibiotic therapy narrowed down to aztreonam.  Superficial cultures with growth for Citrobacter koseri. 3.  Hypertension: Blood pressure appears to be well controlled on carvedilol/isosorbide and will continue intravenous fluids for supplementation of oral intake that appears to be suboptimal at this time. 4.  Congestive heart failure with preserved ejection fraction: Appears compensated at this time and will exercise caution with intravenous fluid replacement.  Subjective:   No acute events noted from overnight   Objective:   BP (!) 117/56 (BP Location: Right Arm)   Pulse 93   Temp 98.4 F (36.9 C) (Oral)   Resp 20   Ht '5\' 7"'$  (1.702 m)   Wt (!) 150.4 kg Comment: scale a  SpO2 100%   BMI 51.93 kg/m   Intake/Output Summary (Last 24 hours) at 12/22/2021 2426 Last data filed at 12/22/2021 8341 Gross per 24 hour  Intake 1285.64 ml  Output 1352 ml  Net -66.36 ml   Weight change: -0.7 kg  Physical Exam: Gen: Morbidly obese woman, somnolent and arouses with difficulty.  Dry oral mucosa. CVS: Pulse regular rhythm, normal rate, S1 and S2 normal Resp: Distant breath sounds bilaterally without distinct rales or rhonchi Abd: Soft, obese,  nontender, bowel sounds normal Ext: Hyperkeratotic skin/lichenification over lower extremities (left greater than right) with chronic venous stasis changes.  No significant pitting edema.  Imaging: MR FOOT RIGHT WO CONTRAST  Result Date: 12/20/2021 CLINICAL DATA:  Diabetes. Foot swelling. Osteomyelitis suspected. Abscess. EXAM: MRI OF THE RIGHT FOREFOOT WITHOUT CONTRAST TECHNIQUE: Multiplanar, multisequence MR imaging of the right forefoot was performed. No intravenous contrast was administered. COMPARISON:  Right foot radiographs 12/17/2021; MRI right forefoot 10/25/2021 FINDINGS: Bones/Joint/Cartilage Postsurgical changes are again seen of amputation of the great toe phalanges and medial greater than lateral aspect of the distal 1st metatarsal including the metatarsal head and medial aspect of the metaphysis. The amputation site is again sharp without residual bone marrow edema to suggest osteomyelitis. There is increased now mild-to-moderate soft tissue swelling and edema within the soft tissues just distal to the amputation site, greatest in the plantar region and suspicious for cellulitis. The previously seen lobular superficial plantar fluid collections within the plantar medial aspect of the forefoot at the level of the proximal and distal aspects of the great toe metatarsal shaft are no longer visualized. Mild thinning of the reason all plantar subcutaneous fat where these fluid collections previously were located. There is decreased T1 and decreased T2 signal scarring within the plantar medial forefoot again just distal to the tip of the abandoned flexor hallucis longus tendon, where previously fluid contact in the plantar aspect of the tendon on the prior 10/25/2021 MRI. Moderate diffuse tarsometatarsal cartilage thinning, joint space narrowing, and peripheral osteophytosis. a similar cartilage thinning and joint  space narrowing at the navicular-intermediate cuneiform articulation with moderate  cuneiform side subchondral degenerative cystic change, similar to prior. No cortical erosion is seen to indicate acute osteomyelitis. Ligaments The Lisfranc ligament complex is intact. Collateral ligaments within the second through fifth metatarsophalangeal joints appear intact. Muscles and Tendons Interval increase in now moderate diffuse midfoot muscle edema. Minimal fluid within the flexor hallucis longus tendon sheath just proximal to the knot of Henry (series 5, image 1). Soft tissues Moderate sign and fluid bright signal throughout the dorsal foot subcutaneous fat, superficial to the extensor tendons, appears unchanged from prior. IMPRESSION: Compared to 10/25/2021: 1. Postsurgical changes of amputation of the great toe phalanges and medial greater than lateral aspect of the distal 1st metatarsal. Increased now mild-to-moderate soft tissue swelling and edema just distal to the amputation site, greatest in the plantar region and suspicious for cellulitis. No definite cortical erosion or marrow edema to indicate acute osteomyelitis. 2. The previously seen lobular superficial plantar fluid collections (previously thought to reflect skin blistering) within the plantar medial aspect of the forefoot at the level of the proximal and distal aspects of the great toe metatarsal shaft are no longer visualized. There is scarring within the plantar medial forefoot just distal to the tip of the abandoned flexor hallucis longus tendon, where previously fluid contacted the plantar aspect of the tendon on the prior 10/25/2021 MRI. 3. Interval increase in now moderate diffuse midfoot muscle edema. 4. Unchanged moderate swelling and fluid signal throughout the subcutaneous fat of the dorsal foot. Electronically Signed   By: Yvonne Kendall M.D.   On: 12/20/2021 12:04    Labs: BMET Recent Labs  Lab 12/17/21 0820 12/18/21 0031 12/19/21 1106 12/20/21 0059 12/21/21 0302 12/22/21 0031  NA 137 134* 134* 135 134* 134*  K 3.8  3.6 4.4 4.2 4.0 3.7  CL 100 98 97* 101 98 99  CO2 24 25 21* 21* 22 19*  GLUCOSE 203* 253* 211* 214* 148* 168*  BUN 93* 89* 85* 85* 90* 99*  CREATININE 3.51* 3.49* 3.26* 3.22* 3.67* 3.95*  CALCIUM 9.2 9.3 9.2 9.3 9.4 9.2  PHOS  --   --  4.1  --   --   --    CBC Recent Labs  Lab 12/17/21 0820 12/18/21 0031 12/19/21 0020 12/22/21 0031  WBC 8.2 9.6 9.9 9.9  HGB 7.2* 7.3* 7.5* 8.1*  HCT 22.0* 22.9* 23.0* 25.2*  MCV 87.0 87.7 86.1 85.7  PLT 169 184 177 193    Medications:     aspirin EC  81 mg Oral Daily   carvedilol  12.5 mg Oral BID WC   citalopram  20 mg Oral Daily   gabapentin  300 mg Oral QHS   heparin injection (subcutaneous)  5,000 Units Subcutaneous Q8H   insulin aspart  0-6 Units Subcutaneous TID WC   insulin glargine-yfgn  25 Units Subcutaneous BID   isosorbide mononitrate  60 mg Oral q morning   Ensure Max Protein  11 oz Oral Daily   ranolazine  500 mg Oral BID   rosuvastatin  5 mg Oral Daily   triamcinolone cream   Topical BID   Elmarie Shiley, MD 12/22/2021, 9:21 AM

## 2021-12-22 NOTE — Progress Notes (Signed)
Mobility Specialist Progress Note:   12/22/21 1016  Mobility  Activity Refused mobility   Pt having jaw and ear pain. Will follow-up as time allows.   [facility] Surveyor, mining Chat only

## 2021-12-22 NOTE — TOC Progression Note (Signed)
Transition of Care [facility]) - Progression Note    Patient Details  Name: [name] MRN: [id number] Date of Birth: Nov 13, 1965  Transition of Care [facility]) CM/SW Contact  Zenon Mayo, RN Phone Number: 12/22/2021, 4:06 PM  Clinical Narrative:    Patient is active with [facility] for [place], [place], [place], will need orders.  She has diabetic foot ulcer, conts on IV abx, per podiatry note she will need to continue to go to the wound care center outpatient also.  TOC following.        Expected Discharge Plan and Services                                                 Social Determinants of Health (SDOH) Interventions    Readmission Risk Interventions    12/18/2021    2:02 PM 11/05/2021    1:58 PM  Readmission Risk Prevention Plan  Transportation Screening Complete Complete  PCP or Specialist Appt within 5-7 Days  Complete  PCP or Specialist Appt within 3-5 Days Complete   Home Care Screening  Complete  Medication Review (RN CM)  Complete  HRI or Home Care Consult Complete   Social Work Consult for [place] Planning/Counseling Complete   Palliative Care Screening Not Applicable   Medication Review Press photographer) Complete

## 2021-12-22 NOTE — Progress Notes (Signed)
PROGRESS NOTE    [name]  JOA:[id number] DOB: May 17, 1965 DOA: 12/17/2021 PCP: Jacklynn Ganong, MD   Brief Narrative: [name] is a 57 y.o. female with a history of CAD s/p CABG and multiple stents, chronic heart failure, chronic LE wounds, CKD stage IV, diabetes mellitus, OSA. Patient presented from [facility] ED secondary to right foot ulcer with concern for infection. Empiric antibiotics initiated. Podiatry consulted.   Assessment and Plan:  Diabetic foot infection Right plantar surface wound. Patient with purulent drainage from wound. Patient follows with wound care as an outpatient. Recent x-ray and MRI imaging without evidence of osteomyelitis but with evidence of fluid collection concerning for phlegmon vs abscess. Patient is stable, without fevers or leukocytosis however with elevated ESR and CRP of 78 and 15 respectively. Patient started empirically on Vancomycin at outside hospital and transitioned to Ceftriaxone and Flagyl on admission. Recent ABIs with non-compressible  arteries. Complicated by severe CKD. Superficial culture obtained on 9/21 is significant for Citrobacter koseri and is pansensitive. Patient developed fever on 9/29. Repeat MRI (9/30) without evidence of osteomyelitis or abscess. Ceftriaxone transitioned to aztreonam secondary to rash. Vancomycin and Flagyl discontinued for persistent rash. -Continue aztreonam -Discontinue Vancomycin and start doxycycline -Follow-up blood cultures  AKI on CKD stage IV Baseline creatinine of about 2.8-3. Creatinine of 3.51 on day prior to admission. Torsemide held. Creatinine is improving slowly. Creatinine continues to worsen and is up to 3.95 on BMP today. Associated BUN of 99 and asterixis. -Continue to hold torsemide -Nephrology recommendations: IV fluids  Fever Uncertain etiology. Presumed secondary to foot infection. Blood cultures obtained and are pending. Now with a sore throat -Check COVID-19 pcr; if negative,  will obtain an RVP  Diabetes mellitus type 2 Patient is on long term insulin. Uncontrolled with hyperglycemia. Hemoglobin A1C of 6.9% from 10/2021. Patient is prescribed Lantus 35 units BID and Humalog 5 units TID with meals in addition to a sliding scale as an outpatient. Patient started on Semglee 30 units qHS and SSI during this admission -Continue Semglee 25 units BID and continue SSI  Chronic diastolic heart failure Stable. Torsemide held secondary to AKI  Elevated LFTs AST/ALT of 364/138 respectively and trended down. Total bilirubin of 1.2. RUQ with cholelithiasis without cholecystitis and borderline CBD dilation; no associated symptoms.  CAD Patient is currently stable. She is on Effient, Ranexa, aspirin and Imdur as an outpatient.  OSA -Continue CPAP  Chronic anemia Baseline hemoglobin of 7-8. No evidence of acute bleeding. In setting of chronic kidney disease. Stable.  Rash Maculopapular rash on arms and upper legs. Presumed secondary to antibiotics. Ceftriaxone discontinued. -Discontinue Flagyl  DVT prophylaxis: SCDs Code Status:   Code Status: Full Code Family Communication: None at bedside Disposition Plan: Discharge pending transition to outpatient antibiotic regimen and nephrology recommendations   Consultants:  Podiatry Vascular surgery Nephrology  Procedures:  None  Antimicrobials: Ceftriaxone Flagyl Aztreonam Vancomycin   Subjective: Sore throat is better today, but patient reports significant left sided jaw pain with decreased ability to open her jaw secondary to pain.  Objective: BP (!) 117/56 (BP Location: Right Arm)   Pulse 93   Temp 98.4 F (36.9 C) (Oral)   Resp 20   Ht _0  (1.702 m)   Wt (!) 150.4 kg Comment: scale a  SpO2 100%   BMI 51.93 kg/m   Examination:  General exam: Appears calm and comfortable HEENT: no obvious abscess noted but left side of face does appear slightly more swollen than right.  Patient able to open mouth  about 50% of normal with pain. Respiratory system: Clear to auscultation. Respiratory effort normal. Cardiovascular system: S1 & S2 heard, RRR. No murmurs, rubs, gallops or clicks. Gastrointestinal system: Abdomen is nondistended, soft and nontender. Normal bowel sounds heard. Central nervous system: Alert and oriented. Bilateral asterixis. Musculoskeletal: No calf tenderness Skin: Maculopapular rash appears slightly worsened extending down passed knees  Data Reviewed: I have personally reviewed following labs and imaging studies  CBC Lab Results  Component Value Date   WBC 9.9 12/22/2021   RBC 2.94 (L) 12/22/2021   HGB 8.1 (L) 12/22/2021   HCT 25.2 (L) 12/22/2021   MCV 85.7 12/22/2021   MCH 27.6 12/22/2021   PLT 193 12/22/2021   MCHC 32.1 12/22/2021   RDW 16.1 (H) 12/22/2021   LYMPHSABS 2.4 02/24/2010   MONOABS 0.6 02/24/2010   EOSABS 0.1 02/24/2010   BASOSABS 0.0 63/84/6659     Last metabolic panel Lab Results  Component Value Date   NA 134 (L) 12/22/2021   K 3.7 12/22/2021   CL 99 12/22/2021   CO2 19 (L) 12/22/2021   BUN 99 (H) 12/22/2021   CREATININE 3.95 (H) 12/22/2021   GLUCOSE 168 (H) 12/22/2021   GFRNONAA 13 (L) 12/22/2021   GFRAA 33 (L) 06/15/2019   CALCIUM 9.2 12/22/2021   PHOS 4.1 12/19/2021   PROT 7.7 12/17/2021   ALBUMIN 2.8 (L) 12/19/2021   LABGLOB 3.7 10/30/2021   AGRATIO 0.8 10/30/2021   BILITOT 1.2 12/17/2021   ALKPHOS 31 (L) 12/17/2021   AST 49 (H) 12/17/2021   ALT 62 (H) 12/17/2021   ANIONGAP 16 (H) 12/22/2021    GFR: Estimated Creatinine Clearance: 24.1 mL/min (A) (by C-G formula based on SCr of 3.95 mg/dL (H)).  Recent Results (from the past 240 hour(s))  Culture, blood (Routine X 2) w Reflex to ID Panel     Status: None (Preliminary result)   Collection Time: 12/19/21 12:52 PM   Specimen: BLOOD LEFT HAND  Result Value Ref Range Status   Specimen Description BLOOD LEFT HAND  Final   Special Requests   Final    BOTTLES DRAWN AEROBIC  AND ANAEROBIC Blood Culture results may not be optimal due to an inadequate volume of blood received in culture bottles   Culture   Final    NO GROWTH 3 DAYS Performed at [facility], [place] [address] [place], [place] [address]    Report Status PENDING  Incomplete  Culture, blood (Routine X 2) w Reflex to ID Panel     Status: None (Preliminary result)   Collection Time: 12/19/21 12:52 PM   Specimen: BLOOD LEFT FOREARM  Result Value Ref Range Status   Specimen Description BLOOD LEFT FOREARM  Final   Special Requests   Final    BOTTLES DRAWN AEROBIC AND ANAEROBIC Blood Culture results may not be optimal due to an inadequate volume of blood received in culture bottles   Culture   Final    NO GROWTH 3 DAYS Performed at [facility], [place] [address], [place], [place] [address]    Report Status PENDING  Incomplete  SARS Coronavirus 2 by RT PCR (hospital order, performed in [facility] hospital lab) *cepheid single result test* Anterior Nasal Swab     Status: None   Collection Time: 12/21/21 12:42 PM   Specimen: Anterior Nasal Swab  Result Value Ref Range Status   SARS Coronavirus 2 by RT PCR NEGATIVE NEGATIVE Final    Comment: (  NOTE) SARS-CoV-2 target nucleic acids are NOT DETECTED.  The SARS-CoV-2 RNA is generally detectable in upper and lower respiratory specimens during the acute phase of infection. The lowest concentration of SARS-CoV-2 viral copies this assay can detect is 250 copies / mL. A negative result does not preclude SARS-CoV-2 infection and should not be used as the sole basis for treatment or other patient management decisions.  A negative result may occur with improper specimen collection / handling, submission of specimen other than nasopharyngeal swab, presence of viral mutation(s) within the areas targeted by this assay, and inadequate number of viral copies (<250 copies / mL). A negative result must be combined with clinical observations, patient history,  and epidemiological information.  Fact Sheet for Patients:   https://www.patel.info/  Fact Sheet for Healthcare Providers: https://hall.com/  This test is not yet approved or  cleared by the [place] FDA and has been authorized for detection and/or diagnosis of SARS-CoV-2 by FDA under an Emergency Use Authorization (EUA).  This EUA will remain in effect (meaning this test can be used) for the duration of the COVID-19 declaration under Section 564(b)(1) of the Act, 21 U.S.C. section 360bbb-3(b)(1), unless the authorization is terminated or revoked sooner.  Performed at [facility], [place] [address] [place], [place] [address]       Radiology Studies: No results found.    LOS: 5 days    Cordelia Poche, MD [facility] 12/22/2021, 11:17 AM   If 7PM-7AM, please contact night-coverage [email or web address]

## 2021-12-23 DIAGNOSIS — I5032 Chronic diastolic (congestive) heart failure: Secondary | ICD-10-CM | POA: Diagnosis not present

## 2021-12-23 DIAGNOSIS — E11628 Type 2 diabetes mellitus with other skin complications: Secondary | ICD-10-CM | POA: Diagnosis not present

## 2021-12-23 DIAGNOSIS — N179 Acute kidney failure, unspecified: Secondary | ICD-10-CM | POA: Diagnosis not present

## 2021-12-23 DIAGNOSIS — E11621 Type 2 diabetes mellitus with foot ulcer: Secondary | ICD-10-CM | POA: Diagnosis not present

## 2021-12-23 LAB — CBC
HCT: 24.3 % — ABNORMAL LOW (ref 36.0–46.0)
Hemoglobin: 7.9 g/dL — ABNORMAL LOW (ref 12.0–15.0)
MCH: 27.5 pg (ref 26.0–34.0)
MCHC: 32.5 g/dL (ref 30.0–36.0)
MCV: 84.7 fL (ref 80.0–100.0)
Platelets: 180 10*3/uL (ref 150–400)
RBC: 2.87 MIL/uL — ABNORMAL LOW (ref 3.87–5.11)
RDW: 15.9 % — ABNORMAL HIGH (ref 11.5–15.5)
WBC: 9.9 10*3/uL (ref 4.0–10.5)
nRBC: 0.5 % — ABNORMAL HIGH (ref 0.0–0.2)

## 2021-12-23 LAB — BASIC METABOLIC PANEL
Anion gap: 15 (ref 5–15)
BUN: 104 mg/dL — ABNORMAL HIGH (ref 6–20)
CO2: 20 mmol/L — ABNORMAL LOW (ref 22–32)
Calcium: 9.2 mg/dL (ref 8.9–10.3)
Chloride: 99 mmol/L (ref 98–111)
Creatinine, Ser: 4.12 mg/dL — ABNORMAL HIGH (ref 0.44–1.00)
GFR, Estimated: 12 mL/min — ABNORMAL LOW (ref >60)
Glucose, Bld: 116 mg/dL — ABNORMAL HIGH (ref 70–99)
Potassium: 3.6 mmol/L (ref 3.5–5.1)
Sodium: 134 mmol/L — ABNORMAL LOW (ref 135–145)

## 2021-12-23 LAB — BILIRUBIN, FRACTIONATED(TOT/DIR/INDIR)
Bilirubin, Direct: 0.4 mg/dL — ABNORMAL HIGH (ref 0.0–0.2)
Indirect Bilirubin: 0.4 mg/dL (ref 0.3–0.9)
Total Bilirubin: 0.8 mg/dL (ref 0.3–1.2)

## 2021-12-23 LAB — IRON AND TIBC
Iron: 23 ug/dL — ABNORMAL LOW (ref 28–170)
Saturation Ratios: 8 % — ABNORMAL LOW (ref 10.4–31.8)
TIBC: 308 ug/dL (ref 250–450)
UIBC: 285 ug/dL

## 2021-12-23 LAB — RETICULOCYTES
Immature Retic Fract: 30.5 % — ABNORMAL HIGH (ref 2.3–15.9)
RBC.: 2.85 MIL/uL — ABNORMAL LOW (ref 3.87–5.11)
Retic Count, Absolute: 128 10*3/uL (ref 19.0–186.0)
Retic Ct Pct: 4.5 % — ABNORMAL HIGH (ref 0.4–3.1)

## 2021-12-23 LAB — GLUCOSE, CAPILLARY
Glucose-Capillary: 167 mg/dL — ABNORMAL HIGH (ref 70–99)
Glucose-Capillary: 94 mg/dL (ref 70–99)
Glucose-Capillary: 181 mg/dL — ABNORMAL HIGH (ref 70–99)
Glucose-Capillary: 91 mg/dL (ref 70–99)
Glucose-Capillary: 84 mg/dL (ref 70–99)

## 2021-12-23 LAB — PROTIME-INR
INR: 1.9 — ABNORMAL HIGH (ref 0.8–1.2)
Prothrombin Time: 21.4 seconds — ABNORMAL HIGH (ref 11.4–15.2)

## 2021-12-23 LAB — D-DIMER, QUANTITATIVE: D-Dimer, Quant: 2.85 ug/mL-FEU — ABNORMAL HIGH (ref 0.00–0.50)

## 2021-12-23 LAB — FERRITIN: Ferritin: 84 ng/mL (ref 11–307)

## 2021-12-23 LAB — LACTATE DEHYDROGENASE: LDH: 209 U/L — ABNORMAL HIGH (ref 98–192)

## 2021-12-23 LAB — FIBRINOGEN: Fibrinogen: 538 mg/dL — ABNORMAL HIGH (ref 210–475)

## 2021-12-23 MED ORDER — TORSEMIDE 20 MG PO TABS
40.0000 mg | ORAL_TABLET | Freq: Every day | ORAL | Status: DC
  Administered 2021-12-23 – 2021-12-25 (×3): 40 mg via ORAL
  Filled 2021-12-23 (×3): qty 2

## 2021-12-23 MED ORDER — METHYLPREDNISOLONE SODIUM SUCC 40 MG IJ SOLR
40.0000 mg | Freq: Once | INTRAMUSCULAR | Status: AC
  Administered 2021-12-23: 40 mg via INTRAVENOUS
  Filled 2021-12-23: qty 1

## 2021-12-23 MED ORDER — OXYCODONE HCL 5 MG PO TABS
5.0000 mg | ORAL_TABLET | Freq: Four times a day (QID) | ORAL | Status: DC | PRN
  Administered 2021-12-23: 5 mg via ORAL
  Filled 2021-12-23: qty 1

## 2021-12-23 MED ORDER — OXYCODONE HCL 5 MG PO TABS
5.0000 mg | ORAL_TABLET | Freq: Three times a day (TID) | ORAL | Status: DC | PRN
  Administered 2021-12-23 – 2021-12-25 (×3): 5 mg via ORAL
  Filled 2021-12-23 (×3): qty 1

## 2021-12-23 NOTE — Progress Notes (Signed)
Pharmacy Antibiotic Note  [name] is a 56 y.o. female transferred from [facility] on 12/17/2021 with DFI/cellulitis. Patient is CKD4 with risk of progression to dialysis dependence. Patient on vancomycin since 9/22. Pt now with itchy red papular rash on her thighs, belly, armpits - started on Wed with start of Rocephin/Flagyl. MD states it looks like a drug rash and assumes it is Rocephin so consulted pharmacy to change to Aztreonam to finish out course. Baseline Scr about 3.0.   Plan: Aztreonam 1 gm IV q8h through 10/4 (stop date in place) Doxycycline '100mg'$  PO BID  Monitor clinical progress, c/s, renal function  Height: '5\' 7"'$  (170.2 cm) Weight: (!) 152.1 kg (335 lb 5.1 oz) (scale a) IBW/kg (Calculated) : 61.6  Temp (24hrs), Avg:98.3 F (36.8 C), Min:98.2 F (36.8 C), Max:98.4 F (36.9 C)  Recent Labs  Lab 12/17/21 0820 12/18/21 0031 12/19/21 0020 12/19/21 1106 12/20/21 0059 12/21/21 0302 12/22/21 0031 12/23/21 0036  WBC 8.2 9.6 9.9  --   --   --  9.9  --   CREATININE 3.51* 3.49*  --  3.26* 3.22* 3.67* 3.95* 4.12*  VANCORANDOM 25 20  --   --  15  --   --   --      Estimated Creatinine Clearance: 23.3 mL/min (A) (by C-G formula based on SCr of 4.12 mg/dL (H)).    Allergies  Allergen Reactions   Bactrim [Sulfamethoxazole-Trimethoprim] Other (See Comments)    Skin peeled off   Diphenhydramine-Acetaminophen Hives   Benadryl [Diphenhydramine] Hives   Cephalosporins Anxiety   Ms Contin [Morphine] Other (See Comments)    Severe somnolence   Tylenol Pm Extra [Diphenhydramine-Apap (Sleep)] Hives   Valium [Diazepam] Hives   Zocor [Simvastatin] Other (See Comments)    Arthralgia    Ceftriaxone Rash   Cipro [Ciprofloxacin Hcl] Anxiety   Latex Itching   Levaquin [Levofloxacin] Anxiety    Vanc PTA 9/22 >>10/1 (LD 9/30) Rocephin 9/27 >> 9/29 Flagyl 9/27 >> (10/3) Aztreonam 9/29>> (10/4) Doxycycline 10/2 >>  Microbiology results: 9/21 surface Cx at [facility] -  citrobacter koseri  9/29 Bcx at [facility] - ngtd  Dimple Nanas, PharmD, BCPS 12/23/2021 8:38 AM

## 2021-12-23 NOTE — Progress Notes (Signed)
Patient ID: [name], female   DOB: Aug 11, 1965, 56 y.o.   MRN: [id number] [facility] Progress Note   Assessment/ Plan:   1. Acute kidney Injury on chronic kidney disease stage IV (baseline creatinine appears to be in the 3s): On admission suspected to be hypovolemic and her diuretics were held.  She continues to have worsening of renal function in spite of intravenous fluids.  I will discontinue fluids at this time given risk of volume overload.  I offered her the option to start dialysis at this time based on the azotemia/symptoms that she has but she declined.  We will get PT evaluation. 2.  Diabetic foot ulcer: Evaluated earlier by vascular surgery who are appropriately deferring lower extremity arteriogram in the setting of worsening renal function and currently ongoing local wound care and antibiotic therapy narrowed down to aztreonam.  Superficial cultures with growth for Citrobacter koseri. 3.  Hypertension: Blood pressure appears to be well controlled on carvedilol/isosorbide and will continue intravenous fluids for supplementation of oral intake that appears to be suboptimal at this time. 4.  Congestive heart failure with preserved ejection fraction: Appears compensated at this time and will discontinue intravenous fluids at this time and restart furosemide at a lower dose of 40 mg daily to augment urine output.  Subjective:   She complains of poor energy and "difficulty getting going".   Objective:   BP (!) 100/40 (BP Location: Right Arm)   Pulse 84   Temp 98.4 F (36.9 C) (Oral)   Resp 18   Ht '5\' 7"'$  (1.702 m)   Wt (!) 152.1 kg Comment: scale a  SpO2 90%   BMI 52.52 kg/m   Intake/Output Summary (Last 24 hours) at 12/23/2021 6734 Last data filed at 12/23/2021 1937 Gross per 24 hour  Intake 1443.97 ml  Output 600 ml  Net 843.97 ml   Weight change: 1.7 kg  Physical Exam: Gen: Morbidly obese woman, appears uncomfortable sitting up in a chair.  Mild tremor  noted. CVS: Pulse regular rhythm, normal rate, S1 and S2 normal Resp: Distant breath sounds bilaterally without distinct rales or rhonchi Abd: Soft, obese, nontender, bowel sounds normal Ext: Hyperkeratotic skin/lichenification over lower extremities (left greater than right) with chronic venous stasis changes.  No significant pitting edema.  Imaging: CT MAXILLOFACIAL WO CONTRAST  Result Date: 12/22/2021 CLINICAL DATA:  Temporomandibular joint pain or limited movement. Left-sided pain. Left facial swelling. EXAM: CT MAXILLOFACIAL WITHOUT CONTRAST TECHNIQUE: Multidetector CT imaging of the maxillofacial structures was performed. Multiplanar CT image reconstructions were also generated. RADIATION DOSE REDUCTION: This exam was performed according to the departmental dose-optimization program which includes automated exposure control, adjustment of the mA and/or kV according to patient size and/or use of iterative reconstruction technique. COMPARISON:  None Available. FINDINGS: Osseous: The right temporomandibular joint appears normal. The left temporomandibular joint shows very minimal degenerative change with slight flattening of the mandibular condyle and a single tiny lateral cyst. No effusion is detectable by CT. Surrounding soft tissues do not show any edematous or inflammatory change. Otherwise, the bones of the face appear normal. The patient does have extensive dental and periodontal disease of the residual maxillary and mandibular dentition, but there is no evidence of regional soft tissue inflammatory complication. Orbits: Normal Sinuses: Clear Soft tissues: Numerous microcalcifications noted within the substance of the parotid glands. This can be seen with chronic Sjogren disease. Submandibular glands show atrophic changes without calcifications. No other regional soft tissue finding. Limited intracranial: Normal IMPRESSION: CT shows  minimal degenerative change of the left temporomandibular joint  with slight flattening of the mandibular condyle and a tiny intra osseous cyst along the lateral margin. See above for full discussion. Electronically Signed   By: Nelson Chimes M.D.   On: 12/22/2021 15:17    Labs: BMET Recent Labs  Lab 12/17/21 0820 12/18/21 0031 12/19/21 1106 12/20/21 0059 12/21/21 0302 12/22/21 0031 12/23/21 0036  NA 137 134* 134* 135 134* 134* 134*  K 3.8 3.6 4.4 4.2 4.0 3.7 3.6  CL 100 98 97* 101 98 99 99  CO2 24 25 21* 21* 22 19* 20*  GLUCOSE 203* 253* 211* 214* 148* 168* 116*  BUN 93* 89* 85* 85* 90* 99* 104*  CREATININE 3.51* 3.49* 3.26* 3.22* 3.67* 3.95* 4.12*  CALCIUM 9.2 9.3 9.2 9.3 9.4 9.2 9.2  PHOS  --   --  4.1  --   --   --   --    CBC Recent Labs  Lab 12/17/21 0820 12/18/21 0031 12/19/21 0020 12/22/21 0031  WBC 8.2 9.6 9.9 9.9  HGB 7.2* 7.3* 7.5* 8.1*  HCT 22.0* 22.9* 23.0* 25.2*  MCV 87.0 87.7 86.1 85.7  PLT 169 184 177 193    Medications:     aspirin EC  81 mg Oral Daily   carvedilol  12.5 mg Oral BID WC   citalopram  20 mg Oral Daily   doxycycline  100 mg Oral Q12H   gabapentin  300 mg Oral QHS   heparin injection (subcutaneous)  5,000 Units Subcutaneous Q8H   insulin aspart  0-6 Units Subcutaneous TID WC   insulin glargine-yfgn  25 Units Subcutaneous BID   isosorbide mononitrate  60 mg Oral q morning   Ensure Max Protein  11 oz Oral Daily   ranolazine  500 mg Oral BID   rosuvastatin  5 mg Oral Daily   triamcinolone cream   Topical BID   Elmarie Shiley, MD 12/23/2021, 9:18 AM

## 2021-12-23 NOTE — Progress Notes (Signed)
PROGRESS NOTE    [name]  BLT:[id number] DOB: 05-12-1965 DOA: 12/17/2021 PCP: Jacklynn Ganong, MD   Brief Narrative: [name] is a 56 y.o. female with a history of CAD s/p CABG and multiple stents, chronic heart failure, chronic LE wounds, CKD stage IV, diabetes mellitus, OSA. Patient presented from [facility] ED secondary to right foot ulcer with concern for infection. Empiric antibiotics initiated. Podiatry consulted.   Assessment and Plan:  Diabetic foot infection Right plantar surface wound. Patient with purulent drainage from wound. Patient follows with wound care as an outpatient. Recent x-ray and MRI imaging without evidence of osteomyelitis but with evidence of fluid collection concerning for phlegmon vs abscess. Patient is stable, without fevers or leukocytosis however with elevated ESR and CRP of 78 and 15 respectively. Patient started empirically on Vancomycin at outside hospital and transitioned to Ceftriaxone and Flagyl on admission. Recent ABIs with non-compressible  arteries. Complicated by severe CKD. Superficial culture obtained on 9/21 is significant for Citrobacter koseri and is pansensitive. Patient developed fever on 9/29. Repeat MRI (9/30) without evidence of osteomyelitis or abscess. Ceftriaxone transitioned to aztreonam secondary to rash. Vancomycin and Flagyl discontinued for persistent rash. Blood cultures with no growth. 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 Creatinine of 3.51 on day prior to admission. Torsemide held. Creatinine is improving slowly. Creatinine continues to worsen and is up to 4.12 on BMP today. Associated BUN of 104, somnolence and asterixis. -Nephrology recommendations: discontinue IV fluids today  Fever Uncertain etiology. Presumed  secondary to foot infection. Blood cultures obtained and are pending. Now with a sore throat -Check COVID-19 pcr; if negative, will obtain an RVP  Diabetes mellitus type 2 Patient is on long term insulin. Uncontrolled with hyperglycemia. Hemoglobin A1C of 6.9% from 10/2021. Patient is prescribed Lantus 35 units BID and Humalog 5 units TID with meals in addition to a sliding scale as an outpatient. Patient started on Semglee 30 units qHS and SSI during this admission -Continue Semglee 25 units BID and continue SSI  Chronic diastolic heart failure Stable. Torsemide held secondary to AKI  Elevated LFTs AST/ALT of 364/138 respectively and trended down. Total bilirubin of 1.2. RUQ with cholelithiasis without cholecystitis and borderline CBD dilation; no associated symptoms.  CAD Patient is currently stable. She is on Effient, Ranexa, aspirin and Imdur as an outpatient.  OSA -Continue CPAP  Chronic anemia Baseline hemoglobin of 7-8. No evidence of acute bleeding. In setting of chronic kidney disease. Stable.  Rash Maculopapular rash on arms and upper legs. Presumed secondary to antibiotics. Ceftriaxone discontinued. Rash on right upper thigh appears to be becoming more confluent resembling purpura associated tenderness -Check CBC, retic count, INR, D-dimer, LDH, bilirubin -Solu-medrol 40 mg x1 -Discontinue antibiotics as mentioned above  DVT prophylaxis: SCDs Code Status:   Code Status: Full Code Family Communication: None at bedside Disposition Plan: Discharge pending transition to outpatient antibiotic regimen and nephrology recommendations.   Consultants:  Podiatry Vascular surgery Nephrology  Procedures:  None  Antimicrobials: Ceftriaxone Flagyl Aztreonam Vancomycin   Subjective: Patient reports painful/itching rash. Somnolence.   Objective: BP (!) 110/53 (BP Location: Right Arm)   Pulse 84   Temp (!) 97.4 F (36.3 C) (Oral)   Resp 18   Ht _0  (1.702 m)   Wt  (!) 152.1 kg Comment: scale a  SpO2 100%  BMI 52.52 kg/m   Examination:  General exam: Appears calm and comfortable Respiratory system: Clear to auscultation. Respiratory effort normal. Cardiovascular system: S1 & S2 heard, RRR. Gastrointestinal system: Abdomen is nondistended, soft and nontender. Normal bowel sounds heard. Central nervous system: Alert and oriented. Musculoskeletal: No edema. No calf tenderness Skin: maculopapular rash is improved on back but is becoming more confluent and resembling purpura on right thigh and right arm especially  Data Reviewed: I have personally reviewed following labs and imaging studies  CBC Lab Results  Component Value Date   WBC 9.9 12/22/2021   RBC 2.94 (L) 12/22/2021   HGB 8.1 (L) 12/22/2021   HCT 25.2 (L) 12/22/2021   MCV 85.7 12/22/2021   MCH 27.6 12/22/2021   PLT 193 12/22/2021   MCHC 32.1 12/22/2021   RDW 16.1 (H) 12/22/2021   LYMPHSABS 2.4 02/24/2010   MONOABS 0.6 02/24/2010   EOSABS 0.1 02/24/2010   BASOSABS 0.0 35/70/1779     Last metabolic panel Lab Results  Component Value Date   NA 134 (L) 12/23/2021   K 3.6 12/23/2021   CL 99 12/23/2021   CO2 20 (L) 12/23/2021   BUN 104 (H) 12/23/2021   CREATININE 4.12 (H) 12/23/2021   GLUCOSE 116 (H) 12/23/2021   GFRNONAA 12 (L) 12/23/2021   GFRAA 33 (L) 06/15/2019   CALCIUM 9.2 12/23/2021   PHOS 4.1 12/19/2021   PROT 7.7 12/17/2021   ALBUMIN 2.8 (L) 12/19/2021   LABGLOB 3.7 10/30/2021   AGRATIO 0.8 10/30/2021   BILITOT 1.2 12/17/2021   ALKPHOS 31 (L) 12/17/2021   AST 49 (H) 12/17/2021   ALT 62 (H) 12/17/2021   ANIONGAP 15 12/23/2021    GFR: Estimated Creatinine Clearance: 23.3 mL/min (A) (by C-G formula based on SCr of 4.12 mg/dL (H)).  Recent Results (from the past 240 hour(s))  Culture, blood (Routine X 2) w Reflex to ID Panel     Status: None (Preliminary result)   Collection Time: 12/19/21 12:52 PM   Specimen: BLOOD LEFT HAND  Result Value Ref Range Status    Specimen Description BLOOD LEFT HAND  Final   Special Requests   Final    BOTTLES DRAWN AEROBIC AND ANAEROBIC Blood Culture results may not be optimal due to an inadequate volume of blood received in culture bottles   Culture   Final    NO GROWTH 4 DAYS Performed at [facility], [place] [address], [place], [place] [address]    Report Status PENDING  Incomplete  Culture, blood (Routine X 2) w Reflex to ID Panel     Status: None (Preliminary result)   Collection Time: 12/19/21 12:52 PM   Specimen: BLOOD LEFT FOREARM  Result Value Ref Range Status   Specimen Description BLOOD LEFT FOREARM  Final   Special Requests   Final    BOTTLES DRAWN AEROBIC AND ANAEROBIC Blood Culture results may not be optimal due to an inadequate volume of blood received in culture bottles   Culture   Final    NO GROWTH 4 DAYS Performed at [facility], [place] [address] [place], [place] [address]    Report Status PENDING  Incomplete  SARS Coronavirus 2 by RT PCR (hospital order, performed in [facility] hospital lab) *cepheid single result test* Anterior Nasal Swab     Status: None   Collection Time: 12/21/21 12:42 PM   Specimen: Anterior Nasal Swab  Result Value Ref Range Status   SARS Coronavirus 2 by RT PCR NEGATIVE  NEGATIVE Final    Comment: (NOTE) SARS-CoV-2 target nucleic acids are NOT DETECTED.  The SARS-CoV-2 RNA is generally detectable in upper and lower respiratory specimens during the acute phase of infection. The lowest concentration of SARS-CoV-2 viral copies this assay can detect is 250 copies / mL. A negative result does not preclude SARS-CoV-2 infection and should not be used as the sole basis for treatment or other patient management decisions.  A negative result may occur with improper specimen collection / handling, submission of specimen other than nasopharyngeal swab, presence of viral mutation(s) within the areas targeted by this assay, and inadequate number of viral  copies (<250 copies / mL). A negative result must be combined with clinical observations, patient history, and epidemiological information.  Fact Sheet for Patients:   https://www.patel.info/  Fact Sheet for Healthcare Providers: https://hall.com/  This test is not yet approved or  cleared by the [place] FDA and has been authorized for detection and/or diagnosis of SARS-CoV-2 by FDA under an Emergency Use Authorization (EUA).  This EUA will remain in effect (meaning this test can be used) for the duration of the COVID-19 declaration under Section 564(b)(1) of the Act, 21 U.S.C. section 360bbb-3(b)(1), unless the authorization is terminated or revoked sooner.  Performed at [facility], [place] [address], [place], [place] [address]       Radiology Studies: CT MAXILLOFACIAL WO CONTRAST  Result Date: 12/22/2021 CLINICAL DATA:  Temporomandibular joint pain or limited movement. Left-sided pain. Left facial swelling. EXAM: CT MAXILLOFACIAL WITHOUT CONTRAST TECHNIQUE: Multidetector CT imaging of the maxillofacial structures was performed. Multiplanar CT image reconstructions were also generated. RADIATION DOSE REDUCTION: This exam was performed according to the departmental dose-optimization program which includes automated exposure control, adjustment of the mA and/or kV according to patient size and/or use of iterative reconstruction technique. COMPARISON:  None Available. FINDINGS: Osseous: The right temporomandibular joint appears normal. The left temporomandibular joint shows very minimal degenerative change with slight flattening of the mandibular condyle and a single tiny lateral cyst. No effusion is detectable by CT. Surrounding soft tissues do not show any edematous or inflammatory change. Otherwise, the bones of the face appear normal. 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 Orbits: Normal Sinuses: Clear Soft tissues: Numerous microcalcifications noted within the substance of the parotid glands. This can be seen with chronic Sjogren disease. Submandibular glands show atrophic changes without calcifications. No other regional soft tissue finding. Limited intracranial: Normal IMPRESSION: CT shows minimal degenerative change of the left temporomandibular joint with slight flattening of the mandibular condyle and a tiny intra osseous cyst along the lateral margin. See above for full discussion. Electronically Signed   By: Nelson Chimes M.D.   On: 12/22/2021 15:17      LOS: 6 days    Cordelia Poche, MD [facility] 12/23/2021, 12:01 PM   If 7PM-7AM, please contact night-coverage [email or web address]

## 2021-12-23 NOTE — Progress Notes (Signed)
   12/23/21 1200  Mobility  Activity Refused mobility   Mobility Specialist Progress Note  Pt refused mobility d/t having leg pain. Will f/u as able.    Lucious Groves Mobility Specialist

## 2021-12-23 NOTE — Evaluation (Signed)
Physical Therapy Evaluation Patient Details Name: [name] MRN: [id number] DOB: 1965/07/14 Today's Date: 12/23/2021  History of Present Illness  Pt is a 56 y/o F who presented to [facility] on 12/11/21 with R foot ulcers with concern of infection, transferred to [facility] on 9/27. Imaging revealed R foot swelling, no clear evidence of osteomyelitis. Workup for Rt foot diabetic infection, AKI on CKD, and fever. 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 PTA, pt able to ambulate mod independent with LRAD and perform all self care/toiletting tasks independently. Max encouragement and educ on importance of mobility after prolonged hospitalization, pt still declines out of bed mobility today. Today, pt reported being in lots of pain from R foot wounds, limiting session to bed mobility and functional strength testing. AROM of lower extremities was reduced, requiring PT for repositioning. Pt would benefit from continued acute PT services to maximize functional mobility and independence prior to d/c with home health services; pt not interested in SNF.      Recommendations for follow up therapy are one component of a multi-disciplinary discharge planning process, led by the attending physician.  Recommendations may be updated based on patient status, additional functional criteria and insurance authorization.  Follow Up Recommendations Home health PT      Assistance Recommended at Discharge Intermittent Supervision/Assistance  Patient can return home with the following  A lot of help with walking and/or transfers;A lot of help with bathing/dressing/bathroom;Assistance with cooking/housework;Assist for transportation;Help with  stairs or ramp for entrance    Equipment Recommendations None recommended by PT  Recommendations for Other Services       Functional Status Assessment Patient has had a recent decline in their functional status and demonstrates the ability to make significant improvements in function in a reasonable and predictable amount of time.     Precautions / Restrictions Precautions Precautions: Fall Restrictions Weight Bearing Restrictions: Yes RLE Weight Bearing: Weight bearing as tolerated      Mobility  Bed Mobility Overal bed mobility: Needs Assistance             General bed mobility comments: Minimal scooting around in bed observed, pt able to reposition LEs with increased time; requiring modA +2 for repositioning up in bed for meal time    Transfers                   General transfer comment: Pt declined transfers and ambulation due to pain and "already being up and down several times"    Ambulation/Gait                  Stairs            Wheelchair Mobility    Modified Rankin (Stroke Patients Only)       Balance                                             Pertinent Vitals/Pain Pain Assessment Pain Assessment: Faces Faces Pain Scale: Hurts whole lot Pain Location: R foot (also of note, R shoulder pain from using grab bars) Pain Descriptors / Indicators: Burning, Grimacing Pain Intervention(s): Monitored  during session, Limited activity within patient's tolerance    Home Living Family/patient expects to be discharged to:: Private residence Living Arrangements: Alone Available Help at Discharge: Family;Available PRN/intermittently (Children live in [place] and [place]) Type of Home: House Home Access: Stairs to enter Entrance Stairs-Rails: Right Entrance Stairs-Number of Steps: 5   Home Layout: One level Home Equipment: Rollator (4 wheels);Cane - single Barista (2 wheels)      Prior Function  Prior Level of Function : Independent/Modified Independent;Needs assist       Physical Assist : Mobility (physical) Mobility (physical): Gait   Mobility Comments: Pt alternates assistive device usage based on how she feels that day ADLs Comments: verbalizes being able to perform self care and toiletting independently. Mod independent with IADLs, does not cook much and gets most food delivered     Hand Dominance        Extremity/Trunk Assessment   Upper Extremity Assessment Upper Extremity Assessment: Generalized weakness (L with increased range compared to R)    Lower Extremity Assessment Lower Extremity Assessment: Generalized weakness (Performs AROM of hips, knees, ankles, in reduce range)       Communication   Communication: No difficulties  Cognition Arousal/Alertness: Awake/alert (Requiring frequent re-asking of questions due to drowsiness) Behavior During Therapy: Agitated Overall Cognitive Status: Impaired/Different from baseline Area of Impairment: Attention, Memory, Following commands, Awareness, Problem solving                   Current Attention Level: Sustained Memory: Decreased short-term memory Following Commands: Follows one step commands inconsistently   Awareness: Intellectual Problem Solving: Slow processing, Decreased initiation, Requires verbal cues General Comments: Pt with minimal awareness of how deficits will impact return to home environment        General Comments General comments (skin integrity, edema, etc.): Pt with rash on R LE and bilateral UE, nurse notified. Verbalized wanting to work with PT services, but minimal initiation at this time. Denies interest in SNF    Exercises     Assessment/Plan    PT Assessment Patient needs continued PT services  PT Problem List Decreased strength;Decreased cognition;Decreased range of motion;Decreased safety awareness;Decreased activity tolerance;Decreased mobility       PT Treatment  Interventions Balance training;Gait training;DME instruction;Stair training;Functional mobility training;Patient/family education;Therapeutic activities;Therapeutic exercise    PT Goals (Current goals can be found in the Care Plan section)  Acute Rehab PT Goals Patient Stated Goal: to go home PT Goal Formulation: With patient Time For Goal Achievement: 01/06/22 Potential to Achieve Goals: Good    Frequency Min 3X/week     Co-evaluation               AM-PAC PT "6 Clicks" Mobility  Outcome Measure Help needed turning from your back to your side while in a flat bed without using bedrails?: A Lot Help needed moving from lying on your back to sitting on the side of a flat bed without using bedrails?: A Lot Help needed moving to and from a bed to a chair (including a wheelchair)?: A Lot Help needed standing up from a chair using your arms (e.g., wheelchair or bedside chair)?: A Lot Help needed to walk in hospital room?: A Lot Help needed climbing 3-5 steps with a railing? : Total 6 Click Score: 11    End of Session   Activity Tolerance: Patient limited by pain Patient left: in bed;with call bell/phone within reach;with bed alarm set Nurse Communication: Mobility status PT Visit Diagnosis: Muscle weakness (generalized) (  M62.81)    Time: 2162-4469 PT Time Calculation (min) (ACUTE ONLY): 30 min   Charges:   PT Evaluation $PT Eval Moderate Complexity: 1 Mod          [address] [place], SPT   [place] Duyen Beckom 12/23/2021, 2:29 PM

## 2021-12-24 ENCOUNTER — Inpatient Hospital Stay (HOSPITAL_COMMUNITY): Payer: Medicare Other

## 2021-12-24 DIAGNOSIS — E11621 Type 2 diabetes mellitus with foot ulcer: Secondary | ICD-10-CM | POA: Diagnosis not present

## 2021-12-24 DIAGNOSIS — L97419 Non-pressure chronic ulcer of right heel and midfoot with unspecified severity: Secondary | ICD-10-CM | POA: Diagnosis not present

## 2021-12-24 LAB — RENAL FUNCTION PANEL
Albumin: 2.1 g/dL — ABNORMAL LOW (ref 3.5–5.0)
Anion gap: 15 (ref 5–15)
BUN: 118 mg/dL — ABNORMAL HIGH (ref 6–20)
CO2: 19 mmol/L — ABNORMAL LOW (ref 22–32)
Calcium: 9 mg/dL (ref 8.9–10.3)
Chloride: 97 mmol/L — ABNORMAL LOW (ref 98–111)
Creatinine, Ser: 4.5 mg/dL — ABNORMAL HIGH (ref 0.44–1.00)
GFR, Estimated: 11 mL/min — ABNORMAL LOW (ref >60)
Glucose, Bld: 96 mg/dL (ref 70–99)
Phosphorus: 5.7 mg/dL — ABNORMAL HIGH (ref 2.5–4.6)
Potassium: 4 mmol/L (ref 3.5–5.1)
Sodium: 131 mmol/L — ABNORMAL LOW (ref 135–145)

## 2021-12-24 LAB — CULTURE, BLOOD (ROUTINE X 2)
Culture: NO GROWTH
Culture: NO GROWTH

## 2021-12-24 LAB — GLUCOSE, CAPILLARY
Glucose-Capillary: 78 mg/dL (ref 70–99)
Glucose-Capillary: 166 mg/dL — ABNORMAL HIGH (ref 70–99)
Glucose-Capillary: 206 mg/dL — ABNORMAL HIGH (ref 70–99)
Glucose-Capillary: 201 mg/dL — ABNORMAL HIGH (ref 70–99)

## 2021-12-24 LAB — MAGNESIUM: Magnesium: 2.2 mg/dL (ref 1.7–2.4)

## 2021-12-24 MED ORDER — FAMOTIDINE 20 MG PO TABS
20.0000 mg | ORAL_TABLET | Freq: Once | ORAL | Status: AC
  Administered 2021-12-24: 20 mg via ORAL
  Filled 2021-12-24: qty 1

## 2021-12-24 MED ORDER — ALUM & MAG HYDROXIDE-SIMETH 200-200-20 MG/5ML PO SUSP
15.0000 mL | Freq: Four times a day (QID) | ORAL | Status: DC | PRN
  Filled 2021-12-24: qty 30

## 2021-12-24 NOTE — TOC Progression Note (Addendum)
Transition of Care [facility]) - Progression Note    Patient Details  Name: [name] MRN: [id number] Date of Birth: 02-08-1966  [facility] CM/SW Contact  Zenon Mayo, RN Phone Number: 12/24/2021, 1:55 PM  Clinical Narrative:    Patient with diabetic foot ulcer, IV abx, , nephrology following for kidney functioning, it is continuing to worsen despite holding diuretics.  Patient will follow up at a outpatient wound clinic also.  She was going to [facility] wound clinic, she had an apt on 9/26 but did not show up , she was here in the hospital during that time.  NCM called to see if could reschedule they said the closet apt they have is November 14 at 8:30, she states they had informed patient to schedule apt at the [facility] because they may have closer apt times. NCM contacted [facility] they scheduled her for 10!3 at 8:00 am and also put her on the cancellation list to call if anyone cancels. She will be seeing Dr. Kalman Shan at wound clinic.        Expected Discharge Plan and Services                                                 Social Determinants of Health (SDOH) Interventions    Readmission Risk Interventions    12/18/2021    2:02 PM 11/05/2021    1:58 PM  Readmission Risk Prevention Plan  Transportation Screening Complete Complete  PCP or Specialist Appt within 5-7 Days  Complete  PCP or Specialist Appt within 3-5 Days Complete   Home Care Screening  Complete  Medication Review (RN CM)  Complete  HRI or Home Care Consult Complete   Social Work Consult for [place] Planning/Counseling Complete   Palliative Care Screening Not Applicable   Medication Review Press photographer) Complete

## 2021-12-24 NOTE — Progress Notes (Signed)
Patient ID: [name], female   DOB: 1965/04/30, 56 y.o.   MRN: [id number] [facility] Progress Note   Assessment/ Plan:   1. Acute kidney Injury on chronic kidney disease stage IV (baseline creatinine appears to be in the 3s): On admission suspected to be hypovolemic and her diuretics were held.  Renal function continues to worsen in spite of holding diuretics/intravenous fluids with the latter discontinued for fear of her decreasing urine output and compounding volume overload.  She declined hemodialysis when offered yesterday and I informed her regarding her worsening renal function.  I suspect she will need crash start dialysis with worsening azotemia that is likely to result in uremic symptoms. 2.  Diabetic foot ulcer: Evaluated earlier by vascular surgery who are appropriately deferring lower extremity arteriogram in the setting of worsening renal function and currently ongoing local wound care and antibiotic therapy narrowed down to aztreonam.  Superficial cultures with growth for Citrobacter koseri. 3.  Hypertension: Blood pressure appears to be well controlled on carvedilol/isosorbide and will continue intravenous fluids for supplementation of oral intake that appears to be suboptimal at this time. 4.  Congestive heart failure with preserved ejection fraction: Appears compensated at this time and will discontinue intravenous fluids at this time and restart furosemide at a lower dose of 40 mg daily to augment urine output.  Subjective:   Suffered an accidental fall earlier this morning when trying to ambulate independently with some soft tissue injury.  "I do not want you talking about dialysis today because I believe I will be healed".   Objective:   BP (!) 103/49   Pulse 67   Temp 98.3 F (36.8 C) (Oral)   Resp 17   Ht '5\' 7"'$  (1.702 m)   Wt (!) 156.2 kg   SpO2 100%   BMI 53.93 kg/m   Intake/Output Summary (Last 24 hours) at 12/24/2021 0933 Last data filed at  12/24/2021 0100 Gross per 24 hour  Intake --  Output 400 ml  Net -400 ml   Weight change: 4.1 kg  Physical Exam: Gen: Morbidly obese woman, appears uncomfortable resting in bed.  CVS: Pulse regular rhythm, normal rate, S1 and S2 normal Resp: Distant breath sounds bilaterally without distinct rales or rhonchi Abd: Soft, obese, nontender, bowel sounds normal Ext: Hyperkeratotic skin/lichenification over lower extremities (left greater than right) with chronic venous stasis changes.  No significant pitting edema.  Imaging: CT MAXILLOFACIAL WO CONTRAST  Result Date: 12/22/2021 CLINICAL DATA:  Temporomandibular joint pain or limited movement. Left-sided pain. Left facial swelling. EXAM: CT MAXILLOFACIAL WITHOUT CONTRAST TECHNIQUE: Multidetector CT imaging of the maxillofacial structures was performed. Multiplanar CT image reconstructions were also generated. RADIATION DOSE REDUCTION: This exam was performed according to the departmental dose-optimization program which includes automated exposure control, adjustment of the mA and/or kV according to patient size and/or use of iterative reconstruction technique. COMPARISON:  None Available. FINDINGS: Osseous: The right temporomandibular joint appears normal. The left temporomandibular joint shows very minimal degenerative change with slight flattening of the mandibular condyle and a single tiny lateral cyst. No effusion is detectable by CT. Surrounding soft tissues do not show any edematous or inflammatory change. Otherwise, the bones of the face appear normal. The patient does have extensive dental and periodontal disease of the residual maxillary and mandibular dentition, but there is no evidence of regional soft tissue inflammatory complication. Orbits: Normal Sinuses: Clear Soft tissues: Numerous microcalcifications noted within the substance of the parotid glands. This can be seen with chronic  Sjogren disease. Submandibular glands show atrophic changes  without calcifications. No other regional soft tissue finding. Limited intracranial: Normal IMPRESSION: CT shows minimal degenerative change of the left temporomandibular joint with slight flattening of the mandibular condyle and a tiny intra osseous cyst along the lateral margin. See above for full discussion. Electronically Signed   By: Nelson Chimes M.D.   On: 12/22/2021 15:17    Labs: BMET Recent Labs  Lab 12/18/21 0031 12/19/21 1106 12/20/21 0059 12/21/21 0302 12/22/21 0031 12/23/21 0036 12/24/21 0653  NA 134* 134* 135 134* 134* 134* 131*  K 3.6 4.4 4.2 4.0 3.7 3.6 4.0  CL 98 97* 101 98 99 99 97*  CO2 25 21* 21* 22 19* 20* 19*  GLUCOSE 253* 211* 214* 148* 168* 116* 96  BUN 89* 85* 85* 90* 99* 104* 118*  CREATININE 3.49* 3.26* 3.22* 3.67* 3.95* 4.12* 4.50*  CALCIUM 9.3 9.2 9.3 9.4 9.2 9.2 9.0  PHOS  --  4.1  --   --   --   --  5.7*   CBC Recent Labs  Lab 12/18/21 0031 12/19/21 0020 12/22/21 0031 12/23/21 1326  WBC 9.6 9.9 9.9 9.9  HGB 7.3* 7.5* 8.1* 7.9*  HCT 22.9* 23.0* 25.2* 24.3*  MCV 87.7 86.1 85.7 84.7  PLT 184 177 193 180    Medications:     aspirin EC  81 mg Oral Daily   carvedilol  12.5 mg Oral BID WC   citalopram  20 mg Oral Daily   gabapentin  300 mg Oral QHS   heparin injection (subcutaneous)  5,000 Units Subcutaneous Q8H   insulin aspart  0-6 Units Subcutaneous TID WC   insulin glargine-yfgn  25 Units Subcutaneous BID   isosorbide mononitrate  60 mg Oral q morning   Ensure Max Protein  11 oz Oral Daily   rosuvastatin  5 mg Oral Daily   torsemide  40 mg Oral Daily   triamcinolone cream   Topical BID   Elmarie Shiley, MD 12/24/2021, 9:33 AM

## 2021-12-24 NOTE — Progress Notes (Addendum)
Progress Note Patient: [name] RCB:[id number] DOB: March 10, 1966 DOA: 12/17/2021  DOS: the patient was seen and examined on 12/24/2021  Brief hospital course: [name] is a 56 y.o. female with a history of CAD s/p CABG, CKD 4, chronic HFpEF, type II DM, OSA.  Presented with right foot ulcer, AKI.  Vascular surgery, podiatry and nephrology following. Currently no plans for surgery.  Cultures are negative. Assessment and Plan: Diabetic foot infection due to right plantar surface wound. Had purulent drainage. Recent x-ray and MRI without evidence of osteomyelitis. ESR elevated CRP elevated. Started on IV vancomycin and currently transition to IV ceftriaxone and Flagyl. Later on ceftriaxone transition to aztreonam due to rash. Vascular surgery and podiatry were consulted, recommended wound care without any surgical intervention.  Left hip pain. Acute fall. Concern for proximal left femoral neck fracture. Patient reports chronic generalized body ache. Had a fall on 10/3. On evaluation reports pain on her left hip. X-ray suspicious for proximal femoral neck fracture. We will get CT left without contrast to identify presence of fracture. Will involve orthopedic pending results of the CT hip.   AKI on CKD stage IV Baseline creatinine of about 2.8-3. On admission 3.51. On torsemide currently on hold. Nephrology was consulted. BUN and serum creatinine continues to trend up.  Nephrology recommends initiation of HD. Patient currently refusing. Will monitor.   Type 2 diabetes mellitus, uncontrolled glycemia with renal complication.  With long-term insulin use Hemoglobin A1c 6.9. Continue Lantus and sliding scale insulin.   Chronic diastolic heart failure Stable. Torsemide held secondary to AKI   Elevated LFTs AST/ALT of 364/138 respectively and trended down. Total bilirubin of 1.2. RUQ with cholelithiasis without cholecystitis and borderline CBD dilation; no associated  symptoms.   CAD Patient is currently stable. She is on Effient, Ranexa, aspirin and Imdur as an outpatient.   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 No evidence of acute bleeding. In setting of chronic kidney disease. Stable.   Rash Maculopapular rash on arms and upper legs. Presumed secondary to antibiotics. Ceftriaxone discontinued. Rash on right upper thigh appears to be becoming more confluent resembling purpura associated tenderness Patient was treated Solu-medrol 40 mg x1 with -Discontinue antibiotics as mentioned above  Morbid Obesity Body mass index is 53.93 kg/m.  Placing the pt at higher risk of poor outcomes.  Bilateral jaw pain Had a CT maxillofacial earlier during the hospitalization which was unremarkable for any acute abnormality or fracture. Patient had a fall after the CT scan although patient reports the pain is about the same in character just worsened in intensity. I do not suspect patient requires further imaging for now.  Subjective: Continues to have some pain in her left hip after the fall yesterday.  Report generalized body ache.  Also reports bilateral jaw pain.  Physical Exam: Vitals:   12/24/21 0424 12/24/21 0500 12/24/21 0600 12/24/21 1922  BP:  (!) 105/40 (!) 103/49 (!) 112/46  Pulse: 71 67 67 78  Resp: 17   20  Temp: 98.3 F (36.8 C)   98.4 F (36.9 C)  TempSrc: Oral   Oral  SpO2:  100% 100% 100%  Weight:      Height:       General: Appear in moderate distress; no visible Abnormal Neck Mass Or lumps, Conjunctiva normal Cardiovascular: S1 and S2 Present, no Murmur, Respiratory: good respiratory effort, Bilateral Air entry present and CTA, no Crackles, no wheezes Abdomen: Bowel Sound  present, mild diffuse tenderness Extremities: bilateral  Pedal edema, left below the knee open skin tear Neurology: alert and oriented to time, place, and person, so far no asterixis on my  evaluation Gait not checked due to patient safety concerns   Data Reviewed: I have Reviewed nursing notes, Vitals, and Lab results since pt's last encounter. Pertinent lab results CBC and BMP I have ordered test including CBC and BMP I have ordered imaging studies x-ray hip and CT hip. I have discussed pt's care plan and test results with nephrology.   Family Communication: No one at bedside  Disposition: Status is: Inpatient Remains inpatient appropriate because: Need further work-up for left hip pain.  Author: Berle Mull, MD 12/24/2021 7:52 PM  Please look on [email or web address] to find out who is on call.

## 2021-12-24 NOTE — Progress Notes (Signed)
Physical Therapy Treatment Patient Details Name: [name] MRN: [id number] DOB: 09/28/1965 Today's Date: 12/24/2021   History of Present Illness Pt is a 56 y/o F who presented to [facility] on 12/11/21 with R foot ulcers with concern of infection, transferred to [facility] on 9/27. Imaging revealed R foot swelling, no clear evidence of osteomyelitis. Workup for Rt foot diabetic infection, AKI on CKD, and fever. 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 Pt able to come to sitting EOB with increased time with use of bed rails and without physical assist. Pt able to come to standing for x2 trials to RW with min asssit to steady on rise but unable to progress gait this session secondary to increased pain. Pt with fair tolerance for LE therex for increased ROM and strength. Pt continues to benefit from skilled PT services to progress toward functional mobility goals.    Recommendations for follow up therapy are one component of a multi-disciplinary discharge planning process, led by the attending physician.  Recommendations may be updated based on patient status, additional functional criteria and insurance authorization.  Follow Up Recommendations  Home health PT     Assistance Recommended at Discharge Intermittent Supervision/Assistance  Patient can return home with the following A lot of help with walking and/or transfers;A lot of help with bathing/dressing/bathroom;Assistance with cooking/housework;Assist for transportation;Help with stairs or ramp for entrance   Equipment Recommendations  None recommended by PT    Recommendations for Other Services       Precautions / Restrictions Precautions Precautions: Fall Restrictions Weight Bearing  Restrictions: Yes RLE Weight Bearing: Weight bearing as tolerated     Mobility  Bed Mobility Overal bed mobility: Needs Assistance Bed Mobility: Supine to Sit, Sit to Supine     Supine to sit: Min guard Sit to supine: Mod assist   General bed mobility comments: asssit to return BLE to bed    Transfers Overall transfer level: Needs assistance Equipment used: Rolling walker (2 wheels) Transfers: Sit to/from Stand Sit to Stand: Min assist           General transfer comment: min asssit to come to standing x2 trials, pt limited by increased L knee pain    Ambulation/Gait               General Gait Details: unable   Stairs             Wheelchair Mobility    Modified Rankin (Stroke Patients Only)       Balance                                            Cognition Arousal/Alertness: Awake/alert Behavior During Therapy: WFL for tasks assessed/performed Overall Cognitive Status: Impaired/Different from baseline                       Memory: Decreased short-term memory         General Comments: Pt with minimal awareness of how deficits will impact return to home environment.        Exercises General Exercises - Lower Extremity Long Arc Quad: AROM, Right, Left,  10 reps, Seated Hip Flexion/Marching: AROM, Right, Left, 10 reps, Seated Toe Raises: AROM, Right, Left, 10 reps, Seated Heel Raises: AROM, Right, Left, 10 reps, Seated    General Comments General comments (skin integrity, edema, etc.): VSS on RA      Pertinent Vitals/Pain Pain Assessment Pain Assessment: Faces Faces Pain Scale: Hurts whole lot Pain Location: L knee from fall Pain Descriptors / Indicators: Grimacing, Sore Pain Intervention(s): Monitored during session, Limited activity within patient's tolerance, Patient requesting pain meds-RN notified    Home Living                          Prior Function            PT Goals (current  goals can now be found in the care plan section) Acute Rehab PT Goals PT Goal Formulation: With patient Time For Goal Achievement: 01/06/22    Frequency    Min 3X/week      PT Plan      Co-evaluation              AM-PAC PT "6 Clicks" Mobility   Outcome Measure  Help needed turning from your back to your side while in a flat bed without using bedrails?: A Lot Help needed moving from lying on your back to sitting on the side of a flat bed without using bedrails?: A Lot Help needed moving to and from a bed to a chair (including a wheelchair)?: A Lot Help needed standing up from a chair using your arms (e.g., wheelchair or bedside chair)?: A Lot Help needed to walk in hospital room?: A Lot Help needed climbing 3-5 steps with a railing? : Total 6 Click Score: 11    End of Session   Activity Tolerance: Patient limited by pain Patient left: in bed;with call bell/phone within reach;with bed alarm set Nurse Communication: Mobility status;Patient requests pain meds PT Visit Diagnosis: Muscle weakness (generalized) (M62.81)     Time: 8242-3536 PT Time Calculation (min) (ACUTE ONLY): 30 min  Charges:  $Gait Training: 8-22 mins $Therapeutic Exercise: 8-22 mins                     Donielle Radziewicz R. PTA Acute Rehabilitation Services Office: [place] 12/24/2021, 2:47 PM

## 2021-12-24 NOTE — Progress Notes (Signed)
Called to room by NT due to patient fall.  NT states she assisted patient to ambulate with walker to the bathroom and instructed her to use the call bell to call for assistance when she was ready to go back to bed.  Patient states she did not call and attempted to get up and fell forward on to her walker and the floor.  She reports she hit her head on the walker but denies pain or injury.  Skin tear noted to her left shin just below her knee. Charge RN assisting at bedside and due to patient's size and weakness staff was unable to assist her into a standing position therefore the maxi move was used to return her back to bed without injury.  Dr. Alcario Drought notified of the above and no new orders were given at this time.  Patient in bed alert and oriented x4 and  pupils are equal and reactive. She denies complaint at this time. Patient instructed on the importance of calling for assistance to prevent falls and she verbalized understanding.    12/24/21 0040  What Happened  Was fall witnessed? No  Was patient injured? Yes  Patient found on floor;in bathroom  Found by Staff-comment ([place] NT)  Stated prior activity bathroom-unassisted  Follow Up  MD notified Dr. Alcario Drought  Time MD notified [phone]  Family notified No - patient refusal  Additional tests No  Simple treatment Dressing  Progress note created (see row info) Yes  Adult Fall Risk Assessment  Risk Factor Category (scoring not indicated) Fall has occurred during this admission (document High fall risk)  Patient Fall Risk Level High fall risk  Adult Fall Risk Interventions  Required Bundle Interventions *See Row Information* High fall risk - low, moderate, and high requirements implemented  Screening for Fall Injury Risk (To be completed on HIGH fall risk patients) - Assessing Need for Floor Mats  Risk For Fall Injury- Criteria for Floor Mats Previous fall this admission  Neurological  Neuro (WDL) X  Level of Consciousness Alert  Orientation  Level Oriented X4  Cognition Appropriate at baseline;Poor safety awareness;Poor judgement;Follows commands;No memory impairment  Speech Clear  R Pupil Size (mm) 4  R Pupil Shape Round  R Pupil Reaction Brisk  L Pupil Size (mm) 4  L Pupil Shape Round  L Pupil Reaction Brisk  Motor Function/Sensation Assessment Motor response  [name] Motor Response Purposeful movement  LUE Motor Response Purposeful movement  RLE Motor Response Purposeful movement  LLE Motor Response Purposeful movement  Musculoskeletal  Musculoskeletal (WDL) X  Assistive Device None  Generalized Weakness Yes  Weight Bearing Restrictions Yes  RLE Weight Bearing WBAT  Integumentary  Integumentary (WDL) X  Skin Color Appropriate for ethnicity  Skin Condition Dry;Flaky  Skin Integrity Erythema/redness;Rash (Skin tear to left leg)  Erythema/Redness Location Abdomen;Arm;Back;Leg  Erythema/Redness Location Orientation Right;Left  Rash Location Abdomen;Arm;Back;Leg  Rash Location Orientation Right;Left  Skin Turgor Non-tenting

## 2021-12-25 ENCOUNTER — Inpatient Hospital Stay (HOSPITAL_COMMUNITY): Payer: Medicare Other

## 2021-12-25 DIAGNOSIS — E11621 Type 2 diabetes mellitus with foot ulcer: Secondary | ICD-10-CM | POA: Diagnosis not present

## 2021-12-25 DIAGNOSIS — L97419 Non-pressure chronic ulcer of right heel and midfoot with unspecified severity: Secondary | ICD-10-CM | POA: Diagnosis not present

## 2021-12-25 LAB — RENAL FUNCTION PANEL
Albumin: 2.1 g/dL — ABNORMAL LOW (ref 3.5–5.0)
Anion gap: 13 (ref 5–15)
BUN: 128 mg/dL — ABNORMAL HIGH (ref 6–20)
CO2: 18 mmol/L — ABNORMAL LOW (ref 22–32)
Calcium: 8.9 mg/dL (ref 8.9–10.3)
Chloride: 98 mmol/L (ref 98–111)
Creatinine, Ser: 4.45 mg/dL — ABNORMAL HIGH (ref 0.44–1.00)
GFR, Estimated: 11 mL/min — ABNORMAL LOW (ref >60)
Glucose, Bld: 220 mg/dL — ABNORMAL HIGH (ref 70–99)
Phosphorus: 6.5 mg/dL — ABNORMAL HIGH (ref 2.5–4.6)
Potassium: 3.7 mmol/L (ref 3.5–5.1)
Sodium: 129 mmol/L — ABNORMAL LOW (ref 135–145)

## 2021-12-25 LAB — CBC
HCT: 22.3 % — ABNORMAL LOW (ref 36.0–46.0)
Hemoglobin: 7.6 g/dL — ABNORMAL LOW (ref 12.0–15.0)
MCH: 27.8 pg (ref 26.0–34.0)
MCHC: 34.1 g/dL (ref 30.0–36.0)
MCV: 81.7 fL (ref 80.0–100.0)
Platelets: 156 10*3/uL (ref 150–400)
RBC: 2.73 MIL/uL — ABNORMAL LOW (ref 3.87–5.11)
RDW: 15.9 % — ABNORMAL HIGH (ref 11.5–15.5)
WBC: 9 10*3/uL (ref 4.0–10.5)
nRBC: 0 % (ref 0.0–0.2)

## 2021-12-25 LAB — GLUCOSE, CAPILLARY
Glucose-Capillary: 268 mg/dL — ABNORMAL HIGH (ref 70–99)
Glucose-Capillary: 226 mg/dL — ABNORMAL HIGH (ref 70–99)
Glucose-Capillary: 205 mg/dL — ABNORMAL HIGH (ref 70–99)
Glucose-Capillary: 243 mg/dL — ABNORMAL HIGH (ref 70–99)

## 2021-12-25 LAB — HAPTOGLOBIN: Haptoglobin: 173 mg/dL (ref 33–346)

## 2021-12-25 LAB — MAGNESIUM: Magnesium: 2.3 mg/dL (ref 1.7–2.4)

## 2021-12-25 MED ORDER — HYDROXYZINE HCL 50 MG/ML IM SOLN
25.0000 mg | Freq: Once | INTRAMUSCULAR | Status: AC
  Administered 2021-12-25: 25 mg via INTRAMUSCULAR
  Filled 2021-12-25: qty 0.5

## 2021-12-25 MED ORDER — ENSURE ENLIVE PO LIQD
237.0000 mL | Freq: Two times a day (BID) | ORAL | Status: DC
  Administered 2021-12-26 – 2021-12-31 (×4): 237 mL via ORAL
  Filled 2021-12-25: qty 237

## 2021-12-25 MED ORDER — TORSEMIDE 20 MG PO TABS
40.0000 mg | ORAL_TABLET | Freq: Two times a day (BID) | ORAL | Status: DC
  Administered 2021-12-25 – 2022-01-06 (×21): 40 mg via ORAL
  Filled 2021-12-25 (×24): qty 2

## 2021-12-25 MED ORDER — PROSOURCE PLUS PO LIQD
30.0000 mL | Freq: Three times a day (TID) | ORAL | Status: DC
  Administered 2021-12-25 – 2022-01-06 (×23): 30 mL via ORAL
  Filled 2021-12-25 (×22): qty 30

## 2021-12-25 MED ORDER — RENA-VITE PO TABS: 1.0000 | ORAL_TABLET | Freq: Every day | ORAL | Status: DC

## 2021-12-25 NOTE — Progress Notes (Signed)
Progress Note Patient: [name] KAJ:[id number] DOB: November 10, 1965 DOA: 12/17/2021  DOS: the patient was seen and examined on 12/25/2021  Brief hospital course: [name] is a 56 y.o. female with a history of CAD s/p CABG, CKD 4, chronic HFpEF, type II DM, OSA.  Presented with right foot ulcer, AKI.  Vascular surgery, podiatry and nephrology following. Currently no plans for surgery.  Cultures are negative. Assessment and Plan: Diabetic foot infection due to right plantar surface wound. Had purulent drainage. Recent x-ray and MRI without evidence of osteomyelitis. ESR elevated CRP elevated. Started on IV vancomycin and currently transition to IV ceftriaxone and Flagyl. Later on ceftriaxone transition to aztreonam due to rash. Vascular surgery and podiatry were consulted, recommended wound care without any surgical intervention.  Left hip pain. Acute fall. Concern for proximal left femoral neck fracture. Patient reports chronic generalized body ache. Had a fall on 10/3. On evaluation reports pain on her left hip. X-ray suspicious for proximal femoral neck fracture. CT hip negative for any acute fracture. Monitor.   AKI on CKD stage IV Baseline creatinine of about 2.8-3. On admission 3.51. On torsemide currently on hold. Nephrology was consulted. BUN and serum creatinine continues to trend up.  Nephrology recommends initiation of HD. Patient currently refusing initiation of HD.  Monitor.   Type 2 diabetes mellitus, uncontrolled glycemia with renal complication.  With long-term insulin use Hemoglobin A1c 6.9. Continue Lantus and sliding scale insulin.   Chronic diastolic heart failure Stable. Torsemide held secondary to AKI   Elevated LFTs AST/ALT of 364/138 respectively and trended down. Total bilirubin of 1.2. RUQ with cholelithiasis without cholecystitis and borderline CBD dilation; no associated symptoms.   CAD Patient is currently stable. She is on Effient, Ranexa,  aspirin and Imdur as an outpatient.   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 No evidence of acute bleeding. In setting of chronic kidney disease. Stable.   Rash Maculopapular rash on arms and upper legs. Presumed secondary to antibiotics. Ceftriaxone discontinued. Rash on right upper thigh appears to be becoming more confluent resembling purpura associated tenderness Patient was treated Solu-medrol 40 mg x1 with -Discontinue antibiotics as mentioned above  Morbid Obesity Body mass index is 53.28 kg/m.  Placing the pt at higher risk of poor outcomes.  Bilateral jaw pain Had a CT maxillofacial earlier during the hospitalization which was unremarkable for any acute abnormality or fracture. Patient had a fall after the CT scan although patient reports the pain is about the same in character just worsened in intensity. I do not suspect patient requires further imaging for now.  Subjective: No nausea no vomiting no fever no chills.  Pain still present.  Physical Exam: Vitals:   12/25/21 0422 12/25/21 0608 12/25/21 0612 12/25/21 1924  BP: 100/86 (!) 119/103 111/62 120/61  Pulse: 70   80  Resp: 20   20  Temp: 97.8 F (36.6 C)   97.8 F (36.6 C)  TempSrc: Oral   Oral  SpO2: 99%   99%  Weight:      Height:       General: Appear in mild distress; no visible Abnormal Neck Mass Or lumps, Conjunctiva normal Cardiovascular: S1 and S2 Present, no Murmur, Respiratory: good respiratory effort, Bilateral Air entry present and CTA, no Crackles, no wheezes Abdomen: Bowel Sound present, Non tender Extremities: bilateral Pedal edema Neurology: alert and oriented to Self, Place and time.  Occasional myoclonic jerks seen.  Data Reviewed:  I have Reviewed nursing notes, Vitals, and Lab results since pt's last encounter. Pertinent lab results CBC and BMP I have ordered test including CBC and I have discussed pt's care plan and  test results with radiology.    Family Communication: No one at bedside  Disposition: Status is: Inpatient Remains inpatient appropriate because: Will need initiation of hemodialysis.  Currently unsafe for discharge.  Author: Berle Mull, MD 12/25/2021 7:52 PM  Please look on [email or web address] to find out who is on call.

## 2021-12-25 NOTE — Progress Notes (Signed)
Patient ID: Analyce [name], female   DOB: 09/26/1965, 56 y.o.   MRN: [id number] [facility] Progress Note   Assessment/ Plan:   1. Acute kidney Injury on chronic kidney disease stage IV (baseline creatinine appears to be in the 3s): On admission suspected to be hypovolemic and her diuretics were held.  Oliguric overnight with essentially unchanged renal function (but worsening azotemia noted).  Based on my assessment earlier this week, felt to be developing some uremic symptoms but she declined hemodialysis based on the religious conviction that she would be healed.  With her current volume status, will increase torsemide to 40 mg twice a day. 2.  Diabetic foot ulcer: Evaluated earlier by vascular surgery with deferment of lower extremity arteriogram in the setting of worsening renal function and currently ongoing local wound care and antibiotic therapy narrowed down to aztreonam.  Superficial cultures with growth for Citrobacter koseri. 3.  Hypertension: Blood pressure appears to be well controlled on carvedilol/isosorbide and will monitor after restarting torsemide. 4.  Congestive heart failure with preserved ejection fraction: Appears compensated at this time and will discontinue intravenous fluids at this time and increase torsemide dose.  Subjective:   Reports to be feeling fair with some intermittent nausea and expresses frustration at recent setbacks in her health   Objective:   BP 111/62   Pulse 70   Temp 97.8 F (36.6 C) (Oral)   Resp 20   Ht '5\' 7"'$  (1.702 m)   Wt (!) 154.3 kg   SpO2 99%   BMI 53.28 kg/m   Intake/Output Summary (Last 24 hours) at 12/25/2021 0915 Last data filed at 12/25/2021 4166 Gross per 24 hour  Intake 960 ml  Output 301 ml  Net 659 ml   Weight change: -1.9 kg  Physical Exam: Gen: Morbidly obese woman, appears uncomfortable resting in bed.  CVS: Pulse regular rhythm, normal rate, S1 and S2 normal Resp: Distant breath sounds bilaterally  without distinct rales or rhonchi Abd: Soft, obese, nontender, bowel sounds normal Ext: Hyperkeratotic skin/lichenification over lower extremities (left greater than right) with chronic venous stasis changes.  No significant pitting edema.  Imaging: DG HIP UNILAT WITH PELVIS 2-3 VIEWS LEFT  Result Date: 12/24/2021 CLINICAL DATA:  Fall.  Left hip pain. EXAM: DG HIP (WITH OR WITHOUT PELVIS) 2-3V LEFT COMPARISON:  None Available. FINDINGS: Large overlying body habitus limits evaluation of fine bony detail. On frontal view of the bilateral hips, there is asymmetric oblique angulation of the left femoral neck with overlying greater trochanter common femoral neck, and femoral head which limits evaluation of the proximal left femur. On dedicated frontal and lateral views of the left hip there appears to be mild inferior and minimal anterior displacement of the femoral neck with respect to the femoral head, suspicious for an acute subcapital fracture there is associated mild linear lucency. The bilateral sacroiliac joint spaces are maintained. Mild superior pubic symphysis joint space narrowing. Calcifications overlying the left hemipelvis are compatible with a calcified fibroid but nonspecific. IMPRESSION: There appears to be mild step-off/displacement of the proximal left femoral neck suspicious for an acute proximal left femoral neck fracture with mild inferior and minimal anterior displacement of the distal fracture component. Electronically Signed   By: Yvonne Kendall M.D.   On: 12/24/2021 12:01    Labs: BMET Recent Labs  Lab 12/19/21 1106 12/20/21 0059 12/21/21 0302 12/22/21 0031 12/23/21 0036 12/24/21 0653 12/25/21 0044  NA 134* 135 134* 134* 134* 131* 129*  K 4.4 4.2 4.0  3.7 3.6 4.0 3.7  CL 97* 101 98 99 99 97* 98  CO2 21* 21* 22 19* 20* 19* 18*  GLUCOSE 211* 214* 148* 168* 116* 96 220*  BUN 85* 85* 90* 99* 104* 118* 128*  CREATININE 3.26* 3.22* 3.67* 3.95* 4.12* 4.50* 4.45*  CALCIUM 9.2  9.3 9.4 9.2 9.2 9.0 8.9  PHOS 4.1  --   --   --   --  5.7* 6.5*   CBC Recent Labs  Lab 12/19/21 0020 12/22/21 0031 12/23/21 1326 12/25/21 0044  WBC 9.9 9.9 9.9 9.0  HGB 7.5* 8.1* 7.9* 7.6*  HCT 23.0* 25.2* 24.3* 22.3*  MCV 86.1 85.7 84.7 81.7  PLT 177 193 180 156    Medications:     aspirin EC  81 mg Oral Daily   carvedilol  12.5 mg Oral BID WC   citalopram  20 mg Oral Daily   gabapentin  300 mg Oral QHS   heparin injection (subcutaneous)  5,000 Units Subcutaneous Q8H   insulin aspart  0-6 Units Subcutaneous TID WC   insulin glargine-yfgn  25 Units Subcutaneous BID   isosorbide mononitrate  60 mg Oral q morning   Ensure Max Protein  11 oz Oral Daily   rosuvastatin  5 mg Oral Daily   torsemide  40 mg Oral Daily   triamcinolone cream   Topical BID   Elmarie Shiley, MD 12/25/2021, 9:15 AM

## 2021-12-25 NOTE — Plan of Care (Signed)

## 2021-12-25 NOTE — Progress Notes (Signed)
Patients requests something for itching, provider notified.

## 2021-12-25 NOTE — Progress Notes (Addendum)
Nutrition Follow-up  DOCUMENTATION CODES:   Morbid obesity  INTERVENTION:  Continue current diet as ordered Discontinue Ensure Max Ensure Enlive po BID, each supplement provides 350 kcal and 20 grams of protein. (Vanilla) 30 ml ProSource Plus TID, each supplement provides 100 kcals and 15 grams protein.   NUTRITION DIAGNOSIS:   Increased nutrient needs related to wound healing as evidenced by other (comment) (increased metabolic need for healing).  ongoing  GOAL:   Patient will meet greater than or equal to 90% of their needs  progressing  MONITOR:   PO intake, Supplement acceptance  REASON FOR ASSESSMENT:   Consult Wound healing  ASSESSMENT:   Pt is a 56yo F with PMH of CAD s/p CABG and multiple stents, HFpEF, HTN, HLD, chronic LE wounds, CKD 4, IDDM, and OSA on CPAP who presents for evaluation of R foot ulcer.  Per Nephrology, pt with worsening azotemia, oliguric over night. Pt declining HD at this time.   Pt noticeably drowsy today. She reports not feeling well with intermittent nausea and was falling asleep during visit. RN encouraging pt to consume nutrition supplements to support wound healing. Pt states that she does not like the chocolate flavor. Uncertain if pt is consuming all meals as she mentioned not eating at times but did not provide more information. Observed breakfast tray with 100% of meal consumed.   Documented meal completions: 10/1: 75% breakfast, 50% lunch, 100% dinner 10/2: 100% breakfast 10/3: 50% breakfast 10/4: 100% breakfast  Medications: SSI 0-6 units TID, semglee 25 units BID, torsemide '40mg'$  BID  Labs: sodium 129, BUN 128, Cr 4.45, Phos 6.5, GFR 11, CBG's 78-268 x24 hours  UOP: 335m x24 hours I/O's: +11650msince admission  Diet Order:   Diet Order             Diet Carb Modified Fluid consistency: Thin; Room service appropriate? Yes  Diet effective now                   EDUCATION NEEDS:   Education needs have been  addressed  Skin:  Skin Assessment: Reviewed RN Assessment  Last BM:  10/4 (type 5)  Height:   Ht Readings from Last 1 Encounters:  12/17/21 '5\' 7"'$  (1.702 m)    Weight:   Wt Readings from Last 1 Encounters:  12/25/21 (!) 154.3 kg    Ideal Body Weight:  61.4 kg  BMI:  Body mass index is 53.28 kg/m.  Estimated Nutritional Needs:   Kcal:  1800-2000  Protein:  100-120g  Fluid:  >/=1.8L  AlClayborne DanaRDN, LDN Clinical Nutrition

## 2021-12-25 NOTE — Plan of Care (Signed)
  Problem: Activity: Goal: Risk for activity intolerance will decrease Outcome: Progressing   Problem: Pain Managment: Goal: General experience of comfort will improve Outcome: Progressing   Problem: Skin Integrity: Goal: Risk for impaired skin integrity will decrease Outcome: Progressing   

## 2021-12-26 ENCOUNTER — Encounter (HOSPITAL_COMMUNITY): Payer: Medicare Other | Admitting: Cardiology

## 2021-12-26 DIAGNOSIS — E11621 Type 2 diabetes mellitus with foot ulcer: Secondary | ICD-10-CM | POA: Diagnosis not present

## 2021-12-26 DIAGNOSIS — L97419 Non-pressure chronic ulcer of right heel and midfoot with unspecified severity: Secondary | ICD-10-CM | POA: Diagnosis not present

## 2021-12-26 LAB — CBC
HCT: 24.6 % — ABNORMAL LOW (ref 36.0–46.0)
Hemoglobin: 7.9 g/dL — ABNORMAL LOW (ref 12.0–15.0)
MCH: 26.9 pg (ref 26.0–34.0)
MCHC: 32.1 g/dL (ref 30.0–36.0)
MCV: 83.7 fL (ref 80.0–100.0)
Platelets: 177 10*3/uL (ref 150–400)
RBC: 2.94 MIL/uL — ABNORMAL LOW (ref 3.87–5.11)
RDW: 15.9 % — ABNORMAL HIGH (ref 11.5–15.5)
WBC: 11.8 10*3/uL — ABNORMAL HIGH (ref 4.0–10.5)
nRBC: 0.3 % — ABNORMAL HIGH (ref 0.0–0.2)

## 2021-12-26 LAB — GLUCOSE, CAPILLARY
Glucose-Capillary: 181 mg/dL — ABNORMAL HIGH (ref 70–99)
Glucose-Capillary: 179 mg/dL — ABNORMAL HIGH (ref 70–99)
Glucose-Capillary: 180 mg/dL — ABNORMAL HIGH (ref 70–99)
Glucose-Capillary: 168 mg/dL — ABNORMAL HIGH (ref 70–99)

## 2021-12-26 LAB — RENAL FUNCTION PANEL
Albumin: 2.2 g/dL — ABNORMAL LOW (ref 3.5–5.0)
Anion gap: 13 (ref 5–15)
BUN: 141 mg/dL — ABNORMAL HIGH (ref 6–20)
CO2: 21 mmol/L — ABNORMAL LOW (ref 22–32)
Calcium: 9 mg/dL (ref 8.9–10.3)
Chloride: 98 mmol/L (ref 98–111)
Creatinine, Ser: 4.43 mg/dL — ABNORMAL HIGH (ref 0.44–1.00)
GFR, Estimated: 11 mL/min — ABNORMAL LOW (ref >60)
Glucose, Bld: 218 mg/dL — ABNORMAL HIGH (ref 70–99)
Phosphorus: 5.6 mg/dL — ABNORMAL HIGH (ref 2.5–4.6)
Potassium: 3.7 mmol/L (ref 3.5–5.1)
Sodium: 132 mmol/L — ABNORMAL LOW (ref 135–145)

## 2021-12-26 LAB — MAGNESIUM: Magnesium: 2.4 mg/dL (ref 1.7–2.4)

## 2021-12-26 MED ORDER — INSULIN ASPART 100 UNIT/ML IJ SOLN
0.0000 [IU] | Freq: Three times a day (TID) | INTRAMUSCULAR | Status: DC
  Administered 2021-12-26 – 2021-12-27 (×4): 3 [IU] via SUBCUTANEOUS
  Administered 2021-12-27 – 2021-12-30 (×9): 5 [IU] via SUBCUTANEOUS
  Administered 2021-12-30 – 2021-12-31 (×3): 3 [IU] via SUBCUTANEOUS
  Administered 2021-12-31: 2 [IU] via SUBCUTANEOUS
  Administered 2022-01-01 – 2022-01-03 (×5): 3 [IU] via SUBCUTANEOUS
  Administered 2022-01-03: 5 [IU] via SUBCUTANEOUS
  Administered 2022-01-03 – 2022-01-04 (×3): 3 [IU] via SUBCUTANEOUS
  Administered 2022-01-05: 2 [IU] via SUBCUTANEOUS
  Administered 2022-01-05: 3 [IU] via SUBCUTANEOUS
  Administered 2022-01-05: 2 [IU] via SUBCUTANEOUS
  Administered 2022-01-06: 3 [IU] via SUBCUTANEOUS
  Administered 2022-01-06: 5 [IU] via SUBCUTANEOUS

## 2021-12-26 MED ORDER — CYCLOBENZAPRINE HCL 5 MG PO TABS
5.0000 mg | ORAL_TABLET | Freq: Three times a day (TID) | ORAL | Status: DC | PRN
  Administered 2021-12-26 – 2022-01-04 (×8): 5 mg via ORAL
  Filled 2021-12-26 (×9): qty 1

## 2021-12-26 MED ORDER — CAMPHOR-MENTHOL 0.5-0.5 % EX LOTN: TOPICAL_LOTION | CUTANEOUS | Status: DC | PRN

## 2021-12-26 MED ORDER — OXYCODONE HCL 5 MG PO TABS
5.0000 mg | ORAL_TABLET | ORAL | Status: DC | PRN
  Administered 2021-12-26 – 2022-01-06 (×14): 5 mg via ORAL
  Filled 2021-12-26 (×14): qty 1

## 2021-12-26 MED ORDER — INSULIN ASPART 100 UNIT/ML IJ SOLN
0.0000 [IU] | Freq: Every day | INTRAMUSCULAR | Status: DC
  Administered 2021-12-27 – 2021-12-29 (×2): 3 [IU] via SUBCUTANEOUS
  Administered 2021-12-30 – 2022-01-02 (×2): 2 [IU] via SUBCUTANEOUS

## 2021-12-26 NOTE — Progress Notes (Signed)
Patient ID: [name], female   DOB: 1965/05/27, 56 y.o.   MRN: [id number] [facility] Progress Note   Assessment/ Plan:   1. Acute kidney Injury on chronic kidney disease stage IV (baseline creatinine appears to be in the 3s): On admission suspected to be hypovolemic and her diuretics were held.  Oliguric overnight with essentially unchanged renal function (but continued BUN rise).  Earlier this week, felt to be developing some uremic symptoms but she declined hemodialysis based on the religious conviction that she would be healed-- she continues to maintain this stance.  2.  Right leg diabetic foot ulcer/infection: Evaluated earlier by vascular surgery with deferment of lower extremity arteriogram in the setting of worsening renal function and currently ongoing local wound care and antibiotic therapy completed with aztreonam.  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 3.  Hypertension: Blood pressure well controlled on carvedilol/isosorbide and torsemide. 4.  Congestive heart failure with preserved ejection fraction: Appears compensated at this time and restarted on torsemide '40mg'$  PO BID. 5. Acute left hip pain after fall: no evidence of fracture on CT imaging and ongoing pain management.   Subjective:   Reports to be feeling fatigued and with pain in her left shoulder and hip that is limiting her from moving well.    Objective:   BP 125/61 (BP Location: Right Arm)   Pulse 84   Temp 98.1 F (36.7 C) (Oral)   Resp 20   Ht '5\' 7"'$  (1.702 m)   Wt (!) 155.5 kg   SpO2 96%   BMI 53.69 kg/m   Intake/Output Summary (Last 24 hours) at 12/26/2021 0840 Last data filed at 12/26/2021 0419 Gross per 24 hour  Intake 560 ml  Output 200 ml  Net 360 ml   Weight change: 1.2 kg  Physical Exam: Gen: Morbidly obese woman, appears uncomfortable resting in bed.  CVS: Pulse regular rhythm, normal rate, S1 and S2 normal Resp: Distant breath  sounds bilaterally without distinct rales or rhonchi Abd: Soft, obese, nontender, bowel sounds normal Ext: Hyperkeratotic skin/lichenification over lower extremities (left greater than right) with chronic venous stasis changes.  No significant pitting edema. Intermittent tremor noted.   Imaging: CT HIP LEFT WO CONTRAST  Result Date: 12/25/2021 CLINICAL DATA:  Hip trauma, fracture suspected, xray done EXAM: CT OF THE LEFT HIP WITHOUT CONTRAST TECHNIQUE: Multidetector CT imaging of the left hip was performed according to the standard protocol. Multiplanar CT image reconstructions were also generated. RADIATION DOSE REDUCTION: This exam was performed according to the departmental dose-optimization program which includes automated exposure control, adjustment of the mA and/or kV according to patient size and/or use of iterative reconstruction technique. COMPARISON:  X-ray 12/24/2021 FINDINGS: Bones/Joint/Cartilage No acute fracture. No dislocation. Joint is positioned in external rotation. No evidence of femoral head avascular necrosis. Mild osteoarthritis of the left hip with joint space narrowing and marginal osteophyte formation. No appreciable hip joint effusion. Included portion of the left hemipelvis appears intact without evidence of fracture or diastasis. No suspicious lytic or sclerotic bone lesion. Ligaments Suboptimally assessed by CT. Muscles and Tendons No acute musculotendinous abnormality by CT. Soft tissues Generalized body wall edema. No focal is evident hematoma about the left hip. No left inguinal lymphadenopathy. Atherosclerotic vascular calcifications. IMPRESSION: 1. No acute fracture or dislocation of the left hip. 2. Mild osteoarthritis of the left hip. 3. Generalized body wall edema. Electronically Signed   By: Davina Poke D.O.   On: 12/25/2021 08:13  DG HIP UNILAT WITH PELVIS 2-3 VIEWS LEFT  Result Date: 12/24/2021 CLINICAL DATA:  Fall.  Left hip pain. EXAM: DG HIP (WITH OR  WITHOUT PELVIS) 2-3V LEFT COMPARISON:  None Available. FINDINGS: Large overlying body habitus limits evaluation of fine bony detail. On frontal view of the bilateral hips, there is asymmetric oblique angulation of the left femoral neck with overlying greater trochanter common femoral neck, and femoral head which limits evaluation of the proximal left femur. On dedicated frontal and lateral views of the left hip there appears to be mild inferior and minimal anterior displacement of the femoral neck with respect to the femoral head, suspicious for an acute subcapital fracture there is associated mild linear lucency. The bilateral sacroiliac joint spaces are maintained. Mild superior pubic symphysis joint space narrowing. Calcifications overlying the left hemipelvis are compatible with a calcified fibroid but nonspecific. IMPRESSION: There appears to be mild step-off/displacement of the proximal left femoral neck suspicious for an acute proximal left femoral neck fracture with mild inferior and minimal anterior displacement of the distal fracture component. Electronically Signed   By: Yvonne Kendall M.D.   On: 12/24/2021 12:01    Labs: BMET Recent Labs  Lab 12/19/21 1106 12/20/21 0059 12/21/21 0302 12/22/21 0031 12/23/21 0036 12/24/21 0653 12/25/21 0044 12/26/21 0034  NA 134* 135 134* 134* 134* 131* 129* 132*  K 4.4 4.2 4.0 3.7 3.6 4.0 3.7 3.7  CL 97* 101 98 99 99 97* 98 98  CO2 21* 21* 22 19* 20* 19* 18* 21*  GLUCOSE 211* 214* 148* 168* 116* 96 220* 218*  BUN 85* 85* 90* 99* 104* 118* 128* 141*  CREATININE 3.26* 3.22* 3.67* 3.95* 4.12* 4.50* 4.45* 4.43*  CALCIUM 9.2 9.3 9.4 9.2 9.2 9.0 8.9 9.0  PHOS 4.1  --   --   --   --  5.7* 6.5* 5.6*   CBC Recent Labs  Lab 12/22/21 0031 12/23/21 1326 12/25/21 0044 12/26/21 0034  WBC 9.9 9.9 9.0 11.8*  HGB 8.1* 7.9* 7.6* 7.9*  HCT 25.2* 24.3* 22.3* 24.6*  MCV 85.7 84.7 81.7 83.7  PLT 193 180 156 177    Medications:     (feeding supplement)  PROSource Plus  30 mL Oral TID BM   aspirin EC  81 mg Oral Daily   carvedilol  12.5 mg Oral BID WC   citalopram  20 mg Oral Daily   feeding supplement  237 mL Oral BID BM   gabapentin  300 mg Oral QHS   heparin injection (subcutaneous)  5,000 Units Subcutaneous Q8H   insulin aspart  0-6 Units Subcutaneous TID WC   insulin glargine-yfgn  25 Units Subcutaneous BID   isosorbide mononitrate  60 mg Oral q morning   rosuvastatin  5 mg Oral Daily   torsemide  40 mg Oral BID   triamcinolone cream   Topical BID   Elmarie Shiley, MD 12/26/2021, 8:40 AM

## 2021-12-26 NOTE — Progress Notes (Signed)
Progress Note Patient: [name] ZOX:[id number] DOB: 31-Jan-1966 DOA: 12/17/2021  DOS: the patient was seen and examined on 12/26/2021  Brief hospital course: [name] is a 56 y.o. female with a history of CAD s/p CABG, CKD 4, chronic HFpEF, type II DM, OSA.  Presented with right foot ulcer, AKI.  Vascular surgery, podiatry and nephrology following. Currently no plans for surgery.  Cultures are negative. Has progressive AKI on CKD 4 with multiple uremic symptoms.  Nephrology recommending to consider HD but patient continues to refuse as she believes her faith will help her kidneys.  Palliative care consulted. Assessment and Plan: Diabetic foot infection due to right plantar surface wound. Had purulent drainage. Recent x-ray and MRI without evidence of osteomyelitis. ESR elevated CRP elevated. Started on IV vancomycin and currently transition to IV ceftriaxone and Flagyl. Later on ceftriaxone transition to aztreonam due to rash. Vascular surgery and podiatry were consulted, recommended wound care without any surgical intervention.   Left hip pain. Acute fall. Concern for proximal left femoral neck fracture. Patient reports chronic generalized body ache. Had a fall on 10/3. On evaluation reports pain on her left hip. X-ray suspicious for proximal femoral neck fracture.  CT hip unremarkable for any acute fracture. Continue pain control.   AKI on CKD stage IV Baseline creatinine of about 2.8-3. On admission 3.51. Nephrology was consulted. BUN and serum creatinine continues to trend up.  Nephrology recommends initiation of HD. Patient currently refusing.  Palliative care consult. Will monitor.   Type 2 diabetes mellitus, uncontrolled glycemia with renal complication.  With long-term insulin use Hemoglobin A1c 6.9. Continue Lantus and sliding scale insulin.   Chronic diastolic heart failure Stable. Torsemide held secondary to AKI   Elevated LFTs AST/ALT of 364/138  respectively and trended down. Total bilirubin of 1.2. RUQ with cholelithiasis without cholecystitis and borderline CBD dilation; no associated symptoms.   CAD Patient is currently stable. She is on Effient, Ranexa, aspirin and Imdur as an outpatient.   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 No evidence of acute bleeding. In setting of chronic kidney disease. Stable.   Rash Maculopapular rash on arms and upper legs. Presumed secondary to antibiotics. Ceftriaxone discontinued. Rash on right upper thigh appears to be becoming more confluent resembling purpura associated tenderness Patient was treated Solu-medrol 40 mg x1 with -Discontinue antibiotics as mentioned above, continue hydroxyzine and Sarna lotion as needed.  Continue topical steroids.   Morbid Obesity Body mass index is 53.93 kg/m.  Placing the pt at higher risk of poor outcomes.   Bilateral jaw pain Had a CT maxillofacial earlier during the hospitalization which was unremarkable for any acute abnormality or fracture. Patient had a fall after the CT scan although patient reports the pain is about the same in character just worsened in intensity. I do not suspect patient requires further imaging for now.  Subjective: No nausea no vomiting.  Reports severe pain and burning and itching all over her body.  Physical Exam: Vitals:   12/26/21 0749 12/26/21 1115 12/26/21 1649 12/26/21 1925  BP: 125/61 125/61 133/61 135/62  Pulse: 84 82 85 89  Resp: 20 20 20 20   Temp: 98.1 F (36.7 C) 97.7 F (36.5 C) 97.9 F (36.6 C) 98 F (36.7 C)  TempSrc: Oral Oral Axillary Oral  SpO2: 96% 100% 100% 100%  Weight:      Height:       General: Appear in mild distress; no  visible Abnormal Neck Mass Or lumps, Conjunctiva normal Diffuse erythematous skin on bilateral upper extremity Cardiovascular: S1 and S2 Present, no Murmur, Respiratory: good respiratory effort,  Bilateral Air entry present and  faint Crackles, no wheezes Abdomen: Bowel Sound present, Non tender  Extremities: trace Pedal edema Neurology: alert and oriented to time, place, and person  Gait not checked due to patient safety concerns   Data Reviewed: I have Reviewed nursing notes, Vitals, and Lab results since pt's last encounter. Pertinent lab results CBC and BMP I have ordered test including CBC and BMP    Family Communication: No one at bedside.  Patient informs her family members by herself.  Disposition: Status is: Inpatient Remains inpatient appropriate because: Severely uremic.  Requires HD.  Author: Berle Mull, MD 12/26/2021 7:28 PM  Please look on [email or web address] to find out who is on call.

## 2021-12-26 NOTE — Progress Notes (Signed)
Patient is complaining of more itching, arms and legs are red still, skin is flaky and some areas look like blisters that have opened up, groin area is very irritated. Patient requesting something similar to a "nipple cream" that she uses at home for skin issues.

## 2021-12-26 NOTE — Progress Notes (Signed)
PT Cancellation Note  Patient Details Name: [name] MRN: [id number] DOB: 1966/03/14   Cancelled Treatment:    Reason Eval/Treat Not Completed: Patient declined, no reason specified. Pt declines PT due to pain in multiple areas, most significantly at L shoulder but also BLE. PT provides encouragement for participation however the patient continues to decline. PT provides brief HEP for shoulder rolls, pec stretch and AROM as tolerated to LUE in an effort to reduce tightness around L shoulder. PT will follow up as time allows.   Zenaida Niece 12/26/2021, 2:19 PM

## 2021-12-27 DIAGNOSIS — Z7189 Other specified counseling: Secondary | ICD-10-CM | POA: Diagnosis not present

## 2021-12-27 DIAGNOSIS — E11621 Type 2 diabetes mellitus with foot ulcer: Secondary | ICD-10-CM | POA: Diagnosis not present

## 2021-12-27 DIAGNOSIS — Z515 Encounter for palliative care: Secondary | ICD-10-CM | POA: Diagnosis not present

## 2021-12-27 DIAGNOSIS — L97419 Non-pressure chronic ulcer of right heel and midfoot with unspecified severity: Secondary | ICD-10-CM | POA: Diagnosis not present

## 2021-12-27 LAB — GLUCOSE, CAPILLARY
Glucose-Capillary: 197 mg/dL — ABNORMAL HIGH (ref 70–99)
Glucose-Capillary: 199 mg/dL — ABNORMAL HIGH (ref 70–99)
Glucose-Capillary: 222 mg/dL — ABNORMAL HIGH (ref 70–99)
Glucose-Capillary: 279 mg/dL — ABNORMAL HIGH (ref 70–99)

## 2021-12-27 LAB — CBC
HCT: 25.4 % — ABNORMAL LOW (ref 36.0–46.0)
Hemoglobin: 8.3 g/dL — ABNORMAL LOW (ref 12.0–15.0)
MCH: 27.1 pg (ref 26.0–34.0)
MCHC: 32.7 g/dL (ref 30.0–36.0)
MCV: 83 fL (ref 80.0–100.0)
Platelets: 187 10*3/uL (ref 150–400)
RBC: 3.06 MIL/uL — ABNORMAL LOW (ref 3.87–5.11)
RDW: 16.1 % — ABNORMAL HIGH (ref 11.5–15.5)
WBC: 13.7 10*3/uL — ABNORMAL HIGH (ref 4.0–10.5)
nRBC: 0.7 % — ABNORMAL HIGH (ref 0.0–0.2)

## 2021-12-27 LAB — RENAL FUNCTION PANEL
Albumin: 2.2 g/dL — ABNORMAL LOW (ref 3.5–5.0)
Anion gap: 14 (ref 5–15)
BUN: 148 mg/dL — ABNORMAL HIGH (ref 6–20)
CO2: 19 mmol/L — ABNORMAL LOW (ref 22–32)
Calcium: 8.8 mg/dL — ABNORMAL LOW (ref 8.9–10.3)
Chloride: 97 mmol/L — ABNORMAL LOW (ref 98–111)
Creatinine, Ser: 4.64 mg/dL — ABNORMAL HIGH (ref 0.44–1.00)
GFR, Estimated: 10 mL/min — ABNORMAL LOW (ref >60)
Glucose, Bld: 178 mg/dL — ABNORMAL HIGH (ref 70–99)
Phosphorus: 5.3 mg/dL — ABNORMAL HIGH (ref 2.5–4.6)
Potassium: 3.9 mmol/L (ref 3.5–5.1)
Sodium: 130 mmol/L — ABNORMAL LOW (ref 135–145)

## 2021-12-27 LAB — BLOOD GAS, ARTERIAL
Acid-base deficit: 2.9 mmol/L — ABNORMAL HIGH (ref 0.0–2.0)
Bicarbonate: 22.6 mmol/L (ref 20.0–28.0)
Drawn by: 406621
O2 Saturation: 74.6 %
Patient temperature: 37
pCO2 arterial: 41 mmHg (ref 32–48)
pH, Arterial: 7.35 (ref 7.35–7.45)
pO2, Arterial: 47 mmHg — ABNORMAL LOW (ref 83–108)

## 2021-12-27 LAB — MAGNESIUM: Magnesium: 2.2 mg/dL (ref 1.7–2.4)

## 2021-12-27 NOTE — Plan of Care (Signed)
  Problem: Education: Goal: Knowledge of General Education information will improve Description: Including pain rating scale, medication(s)/side effects and non-pharmacologic comfort measures Outcome: Not Progressing   Problem: Health Behavior/Discharge Planning: Goal: Ability to manage health-related needs will improve Outcome: Not Progressing   Problem: Clinical Measurements: Goal: Ability to maintain clinical measurements within normal limits will improve Outcome: Not Progressing Goal: Will remain free from infection Outcome: Not Progressing Goal: Diagnostic test results will improve Outcome: Not Progressing Goal: Respiratory complications will improve Outcome: Not Progressing Goal: Cardiovascular complication will be avoided Outcome: Not Progressing   Problem: Activity: Goal: Risk for activity intolerance will decrease Outcome: Not Progressing   Problem: Nutrition: Goal: Adequate nutrition will be maintained Outcome: Not Progressing   Problem: Coping: Goal: Level of anxiety will decrease Outcome: Not Progressing   Problem: Elimination: Goal: Will not experience complications related to bowel motility Outcome: Not Progressing Goal: Will not experience complications related to urinary retention Outcome: Not Progressing   Problem: Pain Managment: Goal: General experience of comfort will improve Outcome: Not Progressing   Problem: Safety: Goal: Ability to remain free from injury will improve Outcome: Not Progressing   Problem: Skin Integrity: Goal: Risk for impaired skin integrity will decrease Outcome: Not Progressing   Problem: Education: Goal: Ability to describe self-care measures that may prevent or decrease complications (Diabetes Survival Skills Education) will improve Outcome: Not Progressing Goal: Individualized Educational Video(s) Outcome: Not Progressing   Problem: Education: Goal: Ability to describe self-care measures that may prevent or  decrease complications (Diabetes Survival Skills Education) will improve Outcome: Not Progressing Goal: Individualized Educational Video(s) Outcome: Not Progressing   Problem: Coping: Goal: Ability to adjust to condition or change in health will improve Outcome: Not Progressing   Problem: Fluid Volume: Goal: Ability to maintain a balanced intake and output will improve Outcome: Not Progressing   Problem: Health Behavior/Discharge Planning: Goal: Ability to identify and utilize available resources and services will improve Outcome: Not Progressing Goal: Ability to manage health-related needs will improve Outcome: Not Progressing   Problem: Metabolic: Goal: Ability to maintain appropriate glucose levels will improve Outcome: Not Progressing   Problem: Nutritional: Goal: Maintenance of adequate nutrition will improve Outcome: Not Progressing Goal: Progress toward achieving an optimal weight will improve Outcome: Not Progressing   Problem: Skin Integrity: Goal: Risk for impaired skin integrity will decrease Outcome: Not Progressing   Problem: Tissue Perfusion: Goal: Adequacy of tissue perfusion will improve Outcome: Not Progressing  Patient total care from a fall in hospital, states that she is not wanting HD because she was told she had a lot of other options prior to hospitalization patient not urinating had to do a in and out

## 2021-12-27 NOTE — Progress Notes (Signed)
Progress Note Patient: [name] ZOX:[id number] DOB: 09-14-1965 DOA: 12/17/2021  DOS: the patient was seen and examined on 12/27/2021  Brief hospital course: [name] is a 56 y.o. female with a history of CAD s/p CABG, CKD 4, chronic HFpEF, type II DM, OSA.  Presented with right foot ulcer, AKI.  Vascular surgery, podiatry and nephrology following. Currently no plans for surgery.  Cultures are negative. Has progressive AKI on CKD 4 with multiple uremic symptoms.  Nephrology recommending to consider HD but patient continues to refuse as she believes her faith will help her kidneys.  Palliative care consulted. Assessment and Plan: Diabetic foot infection due to right plantar surface wound. Had purulent drainage. Recent x-ray and MRI without evidence of osteomyelitis. ESR elevated CRP elevated. Started on IV vancomycin and currently transition to IV ceftriaxone and Flagyl. Later on ceftriaxone transition to aztreonam due to rash. Vascular surgery and podiatry were consulted, recommended wound care without any surgical intervention.  Acute metabolic encephalopathy.  Myoclonic jerks In the setting of uremia. Patient is getting more and more drowsy every day. Suspect this is all setting of uremia and patient continues to refuse hemodialysis. ABG was performed although appears to be a venous sample PCO2 not significantly elevated. No focal deficit at the time of my evaluation. No medicines that contributing to her confusion. Recommended family and the patient to consider hemodialysis strongly. Appreciate palliative care assistance.  Multiple meetings done by them today with the family. Family and patient were clearly explained that patient remains at risk for poor outcome including a cardiac arrest or death due to her uremia.   Left hip pain. Acute fall. Concern for proximal left femoral neck fracture. Patient reports chronic generalized body ache. Had a fall on 10/3. On evaluation  reports pain on her left hip. X-ray suspicious for proximal femoral neck fracture.  CT hip unremarkable for any acute fracture.   AKI on CKD stage IV Baseline creatinine of about 2.8-3. On admission 3.51. Nephrology was consulted. BUN and serum creatinine continues to trend up.  Nephrology recommends initiation of HD. Patient currently refusing.  Palliative care consult. Will monitor.   Type 2 diabetes mellitus, uncontrolled glycemia with renal complication.  With long-term insulin use Hemoglobin A1c 6.9. Continue Semglee and sliding scale insulin.   Chronic diastolic heart failure Stable. Torsemide held secondary to AKI   Elevated LFTs AST/ALT of 364/138 respectively and trended down. Total bilirubin of 1.2. RUQ with cholelithiasis without cholecystitis and borderline CBD dilation; no associated symptoms.   CAD Patient is currently stable. She is on Effient, Ranexa, aspirin and Imdur as an outpatient.   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 No evidence of acute bleeding. In setting of chronic kidney disease. Stable.   Rash Maculopapular rash on arms and upper legs. Presumed secondary to antibiotics. Ceftriaxone discontinued. Rash on right upper thigh appears to be becoming more confluent resembling purpura associated tenderness Patient was treated Solu-medrol 40 mg x1 with continue hydroxyzine and Sarna lotion as needed.  Continue topical steroids.   Morbid Obesity Body mass index is 53.42 kg/m.  Placing the pt at higher risk of poor outcomes.   Bilateral jaw pain Had a CT maxillofacial earlier during the hospitalization which was unremarkable for any acute abnormality or fracture. Patient had a fall after the CT scan although patient reports the pain is about the same in character just worsened in intensity. I do not suspect patient requires further  imaging for now.  Subjective: Drowsy.  Requiring  multiple efforts to keep her awake during the conversation.  No nausea no vomiting.  Continues to report some pain on her left hip area as well as jaw pain.  Physical Exam: Vitals:   12/27/21 0403 12/27/21 0530 12/27/21 0548 12/27/21 1018  BP: (!) 152/43   (!) 100/50  Pulse: 96   91  Resp: 20   19  Temp: 99.4 F (37.4 C)   99.2 F (37.3 C)  TempSrc: Oral   Oral  SpO2: 95%   96%  Weight: (!) 157.7 kg (!) 154.7 kg (!) 154.7 kg   Height:       General: Appear in mild distress; no visible Abnormal Neck Mass Or lumps, Conjunctiva normal Cardiovascular: S1 and S2 Present, aortic systolic Murmur, Respiratory: good respiratory effort, Bilateral Air entry present and CTA, no Crackles, no wheezes Abdomen: Bowel Sound present, Non tender Extremities: no Pedal edema Neurology: Drowsy and oriented to Self, Place, no focal deficit  Data Reviewed: I have Reviewed nursing notes, Vitals, and Lab results since pt's last encounter. Pertinent lab results CBC and BMP I have ordered test including CBC and BMP     Family Communication: No one at bedside.  Discussed with son once, left voicemail otherwise.  Palliative care had multiple family meetings today.  Disposition: Status is: Inpatient Remains inpatient appropriate because: Severely uremic.  Requires HD.  Author: Berle Mull, MD 12/27/2021 6:10 PM  Please look on [email or web address] to find out who is on call.

## 2021-12-27 NOTE — Consult Note (Cosign Needed Addendum)
Palliative Medicine Inpatient Consult Note  Consulting Provider: Lavina Hamman, MD  Reason for consult:   [facility] Consult  Reason for Consult? goals of care conversations   12/27/2021  HPI:  Per hospitalist progress note--> [name] is a 56 y.o. female with a history of CAD s/p CABG, CKD 4, chronic HFpEF, type II DM, OSA.  Presented with right foot ulcer, AKI.  Vascular surgery, podiatry and nephrology following. Currently no plans for surgery.  Cultures are negative. Has progressive AKI on CKD 4 with multiple uremic symptoms.  Nephrology recommending to consider HD but patient continues to refuse as she believes her faith will help her kidneys.  Palliative care consulted for further conversations.   Clinical Assessment/Goals of Care:  *Please note that this is a verbal dictation therefore any spelling or grammatical errors are due to the "[place] [facility]" system interpretation.  I have reviewed medical records including EPIC notes, labs and imaging, received report from bedside RN, assessed the patient who is vocal this afternoon. She is able to comprehend more meaningfully conversation.   I met with [name] (son) and  [name] to further discuss diagnosis prognosis, GOC, EOL wishes, disposition and options.   I introduced Palliative Medicine as specialized medical care for people living with serious illness. It focuses on providing relief from the symptoms and stress of a serious illness. The goal is to improve quality of life for both the patient and the family.  Medical History Review and Understanding:  A review of [name]'s past medical history of CAD, HF, Type II DM, right foot ulcer was held.   Patient has had consistent "ulcers" over the years in her legs and feet which often start with a blister like appearance and then rupture causing ongoing support and care by the wound clinic.   Social History:  Andersyn is from the  [facility] [place] area originally. She has lived in [place] throughout her life. She was married though has been divorced since 2016. She has a son and a daughter. She formerly worked at a [employer] though has not worked in [place] 25 years. She is a person who takes pride in her autonomy. She is a woman of faith and practices within the [employer] denomination.   Functional and Nutritional State:  Prior to hospitalization, [name] was living alone in a single family home. She was able to get around and perform bADLs. She did have a cane to aid in mobility but otherwise per family was doing well.   Advance Directives:  A detailed discussion was had today regarding advanced directives.  [name] has never completed advance directives.   Code Status:  Concepts specific to code status, artifical feeding and hydration, continued IV antibiotics and rehospitalization was had.  The difference between a aggressive medical intervention path  and a palliative comfort care path for this patient at this time was had.   Encouraged patient/family to consider DNR/DNI status understanding evidenced based poor outcomes in similar hospitalized patient, as the cause of arrest is likely associated with advanced chronic/terminal illness rather than an easily reversible acute cardio-pulmonary event. I explained that DNR/DNI does not change the medical plan and it only comes into effect after a person has arrested (died).  It is a protective measure to keep [place] from harming the patient in their last moments of life.    [name] vocalizes a strong desire to continue living. She shares that she is not at a point where she  would not want all efforts made in terms of resuscitation to save her life.   Discussion:  I shared with [name] that we are presently in a very difficult spot in terms of her kidney disease. We reviewed that some symptoms of it's worsening are notable based upon her more lethargic state as well as her  asterixis. I shared that these states are in the setting of toxins building up in her body as a result of her kidney dysfunction and inability to properly filter them out. We reviewed that she is presently at an extremely high risk for further decompensation from a cardiac and or respiratory perspective.  I asked [name] what she has considered in the past as it related to hemodialysis. She expresses that she, "just never thought I would need it." We reviewed how her diabetes which she has had for over two decades now can overtime lead to significant kidney injury if it was poorly controlled. We further reviewed that injury to the kidney can also occur in heart failure.   I asked [name] what frightens her about the idea of dialysis. She shares that she has no significant concerns other than needing to be on it long term. I shared that this will be her likely reality. We reviewed that she would need an access for dialysis placed then to receive some initial treatments. I shared that she should ideally be more clear headed once on a stable regiment. We discussed that this is a life prolonging measure and considered life support. We discussed that if at anytime she chooses to not continue with dialysis that is an option and she would not be obligated to continue if it made her feel worse.   Alternatively if [name] does not get dialysis I shared that the toxins in her system would continue to build up and ultimately lead to an event whereby she would need to go to the ICU especially in the setting of her desire to be full code. I shared that her outlook in such an instance would be relatively poor given her multiple disease processes.  We discussed it is her right to determine if she does not want dialysis though if so I think we should consider a DNAR/DNI order and transition our focus to comfort care. This would thus mean she would come to terms with her life likely being shorter than she may like.   [name]  shares that she needs some time to consider the options. She wants to be alone with her son for further consideration. I shared that I will be available if she comes to a decision or would like additional conversations.   Discussed the importance of continued conversation with family and their  medical providers regarding overall plan of care and treatment options, ensuring decisions are within the context of the patients values and GOCs. __________________________________________________ Addendum:  I met with Katalaya and her son later this afternoon. She shares that she is still considering what she wants to do. She vocalizes that her daughter will be here this evening and they will talk as a family. We reviewed in brief the pro's and con's of dialysis as it relates to [place]. She expresses that she was not as awake this morning due to taking flexeril and oxycodone. She shares that in the past she was in a similar situation and "the lord got me through". We discussed the importance of God in her life and how she feels strongly that if she is meant to do  dialysis he will lead her to this conclusion.   Patient and I discussed why her family is now involved in the conversation.  Delenn shares with me a distrust of the medical system as in the past she was dosed with too much insulin causing a severe event and almost "killing" her. I acknowledged her feelings through therapeutic listening.  _____________________________________________________ Addendum #2:  I met with patients son and daughter. Reviewed the above. Discussed the need to consider dialysis at this point. Offered information on disease process. Family continues to discuss with patient.  Decision Maker: Landowski,[name] Son   [phone]     Selin, Eisler Daughter [phone]     SUMMARY OF RECOMMENDATIONS   FULL CODE --> Honest conversation held about the outlook of resuscitation in the setting of patients co-morbidities  Best case  and worst case scenarios reviewed in the setting of starting or not starting dialysis  Patient needs some time to consider her options and if she would want hemodialysis --> she has shared that a decision will be made today  Ongoing Palliative support  Code Status/Advance Care Planning: FULL CODE  Palliative Prophylaxis:  Aspiration, Bowel Regimen, Delirium Protocol, Frequent Pain Assessment, Oral Care, Palliative Wound Care, and Turn Reposition  Additional Recommendations (Limitations, Scope, Preferences): Continue present care  Psycho-social/Spiritual:  Desire for further Chaplaincy support: Patient has a Theme park manager in her family Additional Recommendations: Best case and worst case in the setting of kidney failure   Prognosis: Patient with multiple co-morbid conditions, (+) 2 IP admissions in 6 months, prolonged hospitalization, (+) kidney dysfunction. Has a high 12 month mortality risk.   Discharge Planning: Discharge plan is not clear at this time  Vitals:   12/26/21 1925 12/27/21 0403  BP: 135/62 (!) 152/43  Pulse: 89 96  Resp: 20 20  Temp: 98 F (36.7 C) 99.4 F (37.4 C)  SpO2: 100% 95%    Intake/Output Summary (Last 24 hours) at 12/27/2021 0655 Last data filed at 12/26/2021 2140 Gross per 24 hour  Intake 837 ml  Output 150 ml  Net 687 ml   Last Weight  Most recent update: 12/27/2021  5:49 AM    Weight  154.7 kg (341 lb 1.6 oz)              Gen: Middle aged AA F 20: Dry mucous membranes CV: Regular rate and rhythm  PULM: On RA, breathing is even and nonlabored ABD: soft/nontender  EXT: Generalized edema  Neuro: Opens eyes but verablizes very little  PPS: 30%   This conversation/these recommendations were discussed with patient primary care team, Dr. Posey Pronto  Total Time: 54  Billing based on MDM: High  Problems Addressed: One acute or chronic illness or injury that poses a threat to life or bodily function  Amount and/or Complexity of Data: Category  3:Discussion of management or test interpretation with external physician/other qualified health care professional/appropriate source (not separately reported)  Risks: Decision regarding hospitalization or escalation of hospital care and Decision not to resuscitate or to de-escalate care because of poor prognosis ______________________________________________________ [facility] Team Team Cell Phone: [phone] Please utilize secure chat with additional questions, if there is no response within 30 minutes please call the above phone number  Palliative Medicine Team providers are available by phone from 7am to 7pm daily and can be reached through the team cell phone.  Should this patient require assistance outside of these hours, please call the patient's attending physician.

## 2021-12-27 NOTE — Plan of Care (Signed)
  Problem: Elimination: Goal: Will not experience complications related to urinary retention Outcome: Not Progressing  No Urine output since 6am.  1 loose brown bowel movement.

## 2021-12-27 NOTE — Progress Notes (Signed)
After looking at ABG results, it is apparent this RT obtained a VBG instead of an ABG,

## 2021-12-27 NOTE — Significant Event (Signed)
Pt, son and daughter will follow-up with Md in morning regarding kidneys/HD treatment decisions.

## 2021-12-27 NOTE — Significant Event (Signed)
Pt is drowsy hard to arouse, delay with simple commands, Vitals stable, repositioned in bed with Nursing student.Holding until awake.

## 2021-12-27 NOTE — Progress Notes (Signed)
   Palliative Medicine Inpatient Follow Up Note  The Palliative Care service has acknowledged the consultation for [name]  I met with [name] at bedside this morning, she was very lethargic and minimally responsive verbally.   Per conversation with nursing, [name] has had a "shift". Nursing concerns about e/o apnea and encephalopathy.   I called patients contacts on information sheet and left VM's for son - [name], daughter [name], and enlisted spouse - [employer] .   I spoke to patients son [name]. He clarified that Rafia is divorced. [name] and his sister, [name] are thus her surrogate decision makers.   [name] plans to be here at [facility] today for additional conversations r/t patients overall health and possible initiation of dialysis. He plans to have his sister present via speaker-phone as she is a Administrator and her shifts end normally around 5-7PM.   Objective Assessment: Vital Signs Vitals:   12/26/21 1925 12/27/21 0403  BP: 135/62 (!) 152/43  Pulse: 89 96  Resp: 20 20  Temp: 98 F (36.7 C) 99.4 F (37.4 C)  SpO2: 100% 95%    Intake/Output Summary (Last 24 hours) at 12/27/2021 0957 Last data filed at 12/27/2021 0533 Gross per 24 hour  Intake 1077 ml  Output 150 ml  Net 927 ml   Last Weight  Most recent update: 12/27/2021  5:49 AM    Weight  154.7 kg (341 lb 1.6 oz)              Gen: Middle aged AA F 21: Dry mucous membranes CV: Regular rate and rhythm  PULM: On RA, breathing is even and nonlabored ABD: soft/nontender  EXT: Generalized edema  Neuro: Opens eyes but verablizes very little  SUMMARY OF RECOMMENDATIONS    Plan for family meeting at [facility]  No Present Charge _____________________________________________________________________________________ [place] Team Team Cell Phone: [phone] Please utilize secure chat with additional questions, if there is no response within 30 minutes please call the  above phone number  Palliative Medicine Team providers are available by phone from 7am to 7pm daily and can be reached through the team cell phone.  Should this patient require assistance outside of these hours, please call the patient's attending physician.

## 2021-12-27 NOTE — Progress Notes (Addendum)
Patient ID: [name], female   DOB: 1966/01/25, 56 y.o.   MRN: [id number] [name] Progress Note   Assessment/ Plan:   1. Acute kidney Injury on chronic kidney disease stage IV (baseline creatinine appears to be in the 3s): On admission suspected to be hypovolemic and her diuretics were held.  Oliguric overnight with worsening renal function/azotemia.  I had offered her hemodialysis earlier this week based on my assessment of emerging uremic symptoms which she declined citing religious conviction for recovery.  Today I again had a lengthy discussion with her regarding the utility of dialysis in improving her uremic symptoms/mental status and she continues to refuse this even after I informed her of the risk of seizures/death.  I would recommend consulting the palliative care service at this point as there is not much to offer beyond dialysis. 2.  Right leg diabetic foot ulcer/infection: Evaluated earlier by vascular surgery with deferment of lower extremity arteriogram in the setting of worsening renal function and currently ongoing local wound care and antibiotic therapy completed with aztreonam.  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 3.  Hypertension: Blood pressure well controlled on carvedilol/isosorbide and torsemide. 4.  Congestive heart failure with preserved ejection fraction: Appears compensated at this time and restarted on torsemide '40mg'$  PO BID. 5. Acute left hip pain after fall: no evidence of fracture on CT imaging and ongoing pain management.   Subjective:   Earlier with difficulties in arousing her/increased somnolence.  When I asked the patient about this, she refutes all this and states that I must be confused.  Objective:   BP (!) 152/43   Pulse 96   Temp 99.4 F (37.4 C) (Oral)   Resp 20   Ht '5\' 7"'$  (1.702 m)   Wt (!) 154.7 kg   SpO2 95%   BMI 53.42 kg/m   Intake/Output Summary (Last 24 hours) at  12/27/2021 0848 Last data filed at 12/27/2021 0533 Gross per 24 hour  Intake 1077 ml  Output 150 ml  Net 927 ml   Weight change: 2.2 kg  Physical Exam: Gen: Morbidly obese woman, somnolent and intermittently awake to conversation CVS: Pulse regular rhythm, normal rate, S1 and S2 normal Resp: Distant breath sounds bilaterally without distinct rales or rhonchi Abd: Soft, obese, nontender, bowel sounds normal Ext: Hyperkeratotic skin/lichenification over lower extremities (left greater than right) with chronic venous stasis changes and no superimposed pitting edema.  Gross intermittent limb jerking noted.  Imaging: No results found.  Labs: BMET Recent Labs  Lab 12/21/21 0302 12/22/21 0031 12/23/21 0036 12/24/21 0653 12/25/21 0044 12/26/21 0034 12/27/21 0050  NA 134* 134* 134* 131* 129* 132* 130*  K 4.0 3.7 3.6 4.0 3.7 3.7 3.9  CL 98 99 99 97* 98 98 97*  CO2 22 19* 20* 19* 18* 21* 19*  GLUCOSE 148* 168* 116* 96 220* 218* 178*  BUN 90* 99* 104* 118* 128* 141* 148*  CREATININE 3.67* 3.95* 4.12* 4.50* 4.45* 4.43* 4.64*  CALCIUM 9.4 9.2 9.2 9.0 8.9 9.0 8.8*  PHOS  --   --   --  5.7* 6.5* 5.6* 5.3*   CBC Recent Labs  Lab 12/23/21 1326 12/25/21 0044 12/26/21 0034 12/27/21 0050  WBC 9.9 9.0 11.8* 13.7*  HGB 7.9* 7.6* 7.9* 8.3*  HCT 24.3* 22.3* 24.6* 25.4*  MCV 84.7 81.7 83.7 83.0  PLT 180 156 177 187    Medications:     (feeding supplement) PROSource Plus  30 mL Oral  TID BM   aspirin EC  81 mg Oral Daily   carvedilol  12.5 mg Oral BID WC   feeding supplement  237 mL Oral BID BM   gabapentin  300 mg Oral QHS   heparin injection (subcutaneous)  5,000 Units Subcutaneous Q8H   insulin aspart  0-15 Units Subcutaneous TID WC   insulin aspart  0-5 Units Subcutaneous QHS   insulin glargine-yfgn  25 Units Subcutaneous BID   isosorbide mononitrate  60 mg Oral q morning   rosuvastatin  5 mg Oral Daily   torsemide  40 mg Oral BID   triamcinolone cream   Topical BID   Elmarie Shiley, MD 12/27/2021, 8:48 AM

## 2021-12-28 DIAGNOSIS — Z7189 Other specified counseling: Secondary | ICD-10-CM | POA: Diagnosis not present

## 2021-12-28 DIAGNOSIS — Z515 Encounter for palliative care: Secondary | ICD-10-CM | POA: Diagnosis not present

## 2021-12-28 DIAGNOSIS — E11621 Type 2 diabetes mellitus with foot ulcer: Secondary | ICD-10-CM | POA: Diagnosis not present

## 2021-12-28 DIAGNOSIS — L97419 Non-pressure chronic ulcer of right heel and midfoot with unspecified severity: Secondary | ICD-10-CM | POA: Diagnosis not present

## 2021-12-28 LAB — RENAL FUNCTION PANEL
Albumin: 2 g/dL — ABNORMAL LOW (ref 3.5–5.0)
Anion gap: 13 (ref 5–15)
BUN: 155 mg/dL — ABNORMAL HIGH (ref 6–20)
CO2: 20 mmol/L — ABNORMAL LOW (ref 22–32)
Calcium: 8.6 mg/dL — ABNORMAL LOW (ref 8.9–10.3)
Chloride: 97 mmol/L — ABNORMAL LOW (ref 98–111)
Creatinine, Ser: 4.89 mg/dL — ABNORMAL HIGH (ref 0.44–1.00)
GFR, Estimated: 10 mL/min — ABNORMAL LOW (ref >60)
Glucose, Bld: 242 mg/dL — ABNORMAL HIGH (ref 70–99)
Phosphorus: 5.1 mg/dL — ABNORMAL HIGH (ref 2.5–4.6)
Potassium: 3.6 mmol/L (ref 3.5–5.1)
Sodium: 130 mmol/L — ABNORMAL LOW (ref 135–145)

## 2021-12-28 LAB — MAGNESIUM: Magnesium: 2.2 mg/dL (ref 1.7–2.4)

## 2021-12-28 LAB — CBC
HCT: 21.8 % — ABNORMAL LOW (ref 36.0–46.0)
HCT: 22.3 % — ABNORMAL LOW (ref 36.0–46.0)
Hemoglobin: 7.1 g/dL — ABNORMAL LOW (ref 12.0–15.0)
Hemoglobin: 7.5 g/dL — ABNORMAL LOW (ref 12.0–15.0)
MCH: 26.7 pg (ref 26.0–34.0)
MCH: 27.5 pg (ref 26.0–34.0)
MCHC: 32.6 g/dL (ref 30.0–36.0)
MCHC: 33.6 g/dL (ref 30.0–36.0)
MCV: 82 fL (ref 80.0–100.0)
MCV: 81.7 fL (ref 80.0–100.0)
Platelets: 146 10*3/uL — ABNORMAL LOW (ref 150–400)
Platelets: 146 10*3/uL — ABNORMAL LOW (ref 150–400)
RBC: 2.66 MIL/uL — ABNORMAL LOW (ref 3.87–5.11)
RBC: 2.73 MIL/uL — ABNORMAL LOW (ref 3.87–5.11)
RDW: 15.9 % — ABNORMAL HIGH (ref 11.5–15.5)
RDW: 16 % — ABNORMAL HIGH (ref 11.5–15.5)
WBC: 11.5 10*3/uL — ABNORMAL HIGH (ref 4.0–10.5)
WBC: 11.5 10*3/uL — ABNORMAL HIGH (ref 4.0–10.5)
nRBC: 0.9 % — ABNORMAL HIGH (ref 0.0–0.2)
nRBC: 0.6 % — ABNORMAL HIGH (ref 0.0–0.2)

## 2021-12-28 LAB — GLUCOSE, CAPILLARY
Glucose-Capillary: 247 mg/dL — ABNORMAL HIGH (ref 70–99)
Glucose-Capillary: 209 mg/dL — ABNORMAL HIGH (ref 70–99)
Glucose-Capillary: 208 mg/dL — ABNORMAL HIGH (ref 70–99)
Glucose-Capillary: 200 mg/dL — ABNORMAL HIGH (ref 70–99)

## 2021-12-28 LAB — PROTIME-INR
INR: 1.6 — ABNORMAL HIGH (ref 0.8–1.2)
Prothrombin Time: 19.1 seconds — ABNORMAL HIGH (ref 11.4–15.2)

## 2021-12-28 MED ORDER — GABAPENTIN 100 MG PO CAPS
100.0000 mg | ORAL_CAPSULE | Freq: Every day | ORAL | Status: DC
  Administered 2021-12-28 – 2022-01-05 (×9): 100 mg via ORAL
  Filled 2021-12-28 (×9): qty 1

## 2021-12-28 NOTE — Progress Notes (Addendum)
2200 patient total care able to make all needs known call light in reach no urine reported since 6am by day RN, bladder scanned over 645m order to in and out x1 8052mamber urine with in and out pt tolerated well. Patient noted to have incontinence wounds to vagina and sacrum.

## 2021-12-28 NOTE — Progress Notes (Signed)
230am bladder scanned 117m

## 2021-12-28 NOTE — Progress Notes (Signed)
Patient ID: [name], female   DOB: October 05, 1965, 56 y.o.   MRN: [id number] [facility] Progress Note   Assessment/ Plan:   1. Acute kidney Injury on chronic kidney disease stage IV (baseline creatinine appears to be in the 3s): On admission suspected to be hypovolemic and her diuretics were held following which she became euvolemic to hypervolemic and diuretics were restarted.  She has worsening azotemia with uremic symptoms that this limiting her overall progress with mobility/PT as well.  She was more lucid this morning and we had a lengthy discussion regarding the utility of dialysis.  She asked me to call her son and daughter to inform them of my findings and recommendations to see if they had any questions.  She will speak to them after I update them and then decide whether she will consent to dialysis.  If she consents to dialysis, I will request for IR to evaluate the patient and place her on schedule tomorrow for dialysis catheter placement/order for dialysis tomorrow. 2.  Right leg diabetic foot ulcer/infection: Evaluated earlier by vascular surgery with deferment of lower extremity arteriogram in the setting of worsening renal function and currently ongoing local wound care and antibiotic therapy completed with aztreonam.  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 3.  Hypertension: Blood pressure well controlled on carvedilol/isosorbide and torsemide. 4.  Congestive heart failure with preserved ejection fraction: Appears compensated at this time and restarted on torsemide '40mg'$  PO BID. 5. Acute left hip pain after fall: no evidence of fracture on CT imaging and ongoing pain management.   Subjective:   More awake/alert this morning and able to converse without much confusion.  Seen by palliative care service yesterday after previous decision not to undertake dialysis.  Objective:   BP 112/61   Pulse 91   Temp 98.9 F (37.2 C)  (Oral)   Resp 19   Ht '5\' 7"'$  (1.702 m)   Wt (!) 157.7 kg   SpO2 98%   BMI 54.45 kg/m   Intake/Output Summary (Last 24 hours) at 12/28/2021 0842 Last data filed at 12/28/2021 0600 Gross per 24 hour  Intake 900 ml  Output 800 ml  Net 100 ml   Weight change: 0 kg  Physical Exam: Gen: Morbidly obese woman, awakens easily and we had a more meaningful conversation than yesterday. CVS: Pulse regular rhythm, normal rate, S1 and S2 normal Resp: Distant breath sounds bilaterally without distinct rales or rhonchi Abd: Soft, obese, nontender, bowel sounds normal Ext: Hyperkeratotic skin/lichenification over lower extremities (left greater than right) with chronic venous stasis changes and no superimposed pitting edema.  Gross intermittent limb jerking noted.  Imaging: No results found.  Labs: BMET Recent Labs  Lab 12/22/21 0031 12/23/21 0036 12/24/21 9381 12/25/21 0044 12/26/21 0034 12/27/21 0050 12/28/21 0044  NA 134* 134* 131* 129* 132* 130* 130*  K 3.7 3.6 4.0 3.7 3.7 3.9 3.6  CL 99 99 97* 98 98 97* 97*  CO2 19* 20* 19* 18* 21* 19* 20*  GLUCOSE 168* 116* 96 220* 218* 178* 242*  BUN 99* 104* 118* 128* 141* 148* 155*  CREATININE 3.95* 4.12* 4.50* 4.45* 4.43* 4.64* 4.89*  CALCIUM 9.2 9.2 9.0 8.9 9.0 8.8* 8.6*  PHOS  --   --  5.7* 6.5* 5.6* 5.3* 5.1*   CBC Recent Labs  Lab 12/25/21 0044 12/26/21 0034 12/27/21 0050 12/28/21 0538  WBC 9.0 11.8* 13.7* 11.5*  HGB 7.6* 7.9* 8.3* 7.1*  HCT 22.3* 24.6*  25.4* 21.8*  MCV 81.7 83.7 83.0 82.0  PLT 156 177 187 146*    Medications:     (feeding supplement) PROSource Plus  30 mL Oral TID BM   carvedilol  12.5 mg Oral BID WC   feeding supplement  237 mL Oral BID BM   gabapentin  300 mg Oral QHS   insulin aspart  0-15 Units Subcutaneous TID WC   insulin aspart  0-5 Units Subcutaneous QHS   insulin glargine-yfgn  25 Units Subcutaneous BID   isosorbide mononitrate  60 mg Oral q morning   rosuvastatin  5 mg Oral Daily    torsemide  40 mg Oral BID   triamcinolone cream   Topical BID   Elmarie Shiley, MD 12/28/2021, 8:42 AM

## 2021-12-28 NOTE — Progress Notes (Signed)
   Palliative Medicine Inpatient Follow Up Note   HPI: [name] is a 56 y.o. female with a history of CAD s/p CABG, CKD 4, chronic HFpEF, type II DM, OSA.  Presented with right foot ulcer, AKI.  Vascular surgery, podiatry and nephrology following. Currently no plans for surgery.  Cultures are negative. Has progressive AKI on CKD 4 with multiple uremic symptoms.  Nephrology recommending to consider HD but patient continues to refuse as she believes her faith will help her kidneys.  Palliative care consulted for further conversations.   Today's Discussion 12/28/2021  *Please note that this is a verbal dictation therefore any spelling or grammatical errors are due to the "[employer]" system interpretation.  Chart reviewed inclusive of vital signs, progress notes, laboratory results, and diagnostic images.   I met with patients RN, Earlie Server this morning. She shares that as of presently she has no concerns regarding [name].   I met with [name] at bedside this morning, she was sleepy but arousable. Reviewed that yesterday she had multiple sedative medications at one time and she did not do that this morning.   I called patients son, [name] this morning created space and opportunity to explore thoughts feelings and fears regarding current medical situation. He shares that he does have more questions for the nephrology team. I shared that I had reached out to them to provide an update to him and recommended that he three way his sister when the team calls him back.   Provided "Hard Choices for [employer]" booklet as a resource.   Questions and concerns addressed/Palliative Support Provided.   Objective Assessment: Vital Signs Vitals:   12/28/21 0359 12/28/21 0627  BP: (!) 127/49 112/61  Pulse: 91   Resp: 19   Temp: 98.9 F (37.2 C)   SpO2: 98%     Intake/Output Summary (Last 24 hours) at 12/28/2021 0643 Last data filed at 12/28/2021 0245 Gross per 24 hour  Intake 640 ml   Output 800 ml  Net -160 ml   Last Weight  Most recent update: 12/28/2021  4:01 AM    Weight  157.7 kg (347 lb 10.7 oz)              Gen: Middle aged AA F 25: Dry mucous membranes CV: Regular rate and rhythm  PULM: On RA, breathing is even and nonlabored ABD: soft/nontender  EXT: Generalized edema  Neuro: Arousable and able to converse though response slow  SUMMARY OF RECOMMENDATIONS   FULL CODE     Patient is faced with the decision of whether or not to pursue dialysis  Patients sedating medications should be provided in intervals as opposed to all at one time   Patients family requests to speak to nephrologist which has been coordinated through secure chat   Ongoing Palliative support  Billing based on MDM: High ______________________________________________________________________________________ [facility] Team Team Cell Phone: [phone] Please utilize secure chat with additional questions, if there is no response within 30 minutes please call the above phone number  Palliative Medicine Team providers are available by phone from 7am to 7pm daily and can be reached through the team cell phone.  Should this patient require assistance outside of these hours, please call the patient's attending physician.

## 2021-12-28 NOTE — Progress Notes (Signed)
Progress Note Patient: [name] UKG:[id number] DOB: Feb 28, 1966 DOA: 12/17/2021  DOS: the patient was seen and examined on 2022/01/01  Brief hospital course: [name] is a 56 y.o. female with a history of CAD s/p CABG, CKD 4, chronic HFpEF, type II DM, OSA.  Presented with right foot ulcer, AKI.  Vascular surgery, podiatry and nephrology following. Currently no plans for surgery.  Cultures are negative. Has progressive AKI on CKD 4 with multiple uremic symptoms.  Nephrology recommending to consider HD but patient continues to refuse as she believes her faith will help her kidneys.  Palliative care consulted. Assessment and Plan: Diabetic foot infection due to right plantar surface wound. Had purulent drainage. Recent x-ray and MRI without evidence of osteomyelitis. ESR elevated CRP elevated. Started on IV vancomycin and currently transition to IV ceftriaxone and Flagyl. Later on ceftriaxone transition to aztreonam due to rash. Vascular surgery and podiatry were consulted, recommended wound care without any surgical intervention.  Acute metabolic encephalopathy.  Myoclonic jerks In the setting of uremia. Patient is getting more and more drowsy every day. Suspect this is all setting of uremia and patient continues to refuse hemodialysis. ABG was performed although appears to be a venous sample PCO2 not significantly elevated. No focal deficit at the time of my evaluation. No medicines that contributing to her confusion. Recommended family and the patient to consider hemodialysis strongly. Appreciate palliative care assistance.  Multiple meetings done by them today with the family. Family and patient were clearly explained that patient remains at risk for poor outcome including a cardiac arrest or death due to her uremia. 01/02/2023 spent another 30 minutes discussing with the patient with regards to discussion for needs for dialysis initiation.  Answered all the questions with regards to  the process of initiation of HD.  Patient currently still not decided.   Left hip pain. Acute fall. Concern for proximal left femoral neck fracture. Patient reports chronic generalized body ache. Had a fall on 10/3. On evaluation reports pain on her left hip. X-ray suspicious for proximal femoral neck fracture.  CT hip unremarkable for any acute fracture.   AKI on CKD stage IV Baseline creatinine of about 2.8-3. On admission 3.51. Nephrology was consulted. BUN and serum creatinine continues to trend up.  Nephrology recommends initiation of HD. Patient currently refusing.  Palliative care consult. Multiple discussion with the patient, family has been made.  I palliative care as well as nephrology all have recommended to the patient to initiated HD as soon as possible, and a delay can lead to significant comorbidities including cardiac arrest. Patient and family still undecided. Will monitor.   Type 2 diabetes mellitus, uncontrolled glycemia with renal complication.  With long-term insulin use Hemoglobin A1c 6.9. Continue Semglee and sliding scale insulin.   Chronic diastolic heart failure Stable. Torsemide held secondary to AKI   Elevated LFTs AST/ALT of 364/138 respectively and trended down. Total bilirubin of 1.2. RUQ with cholelithiasis without cholecystitis and borderline CBD dilation; no associated symptoms.   CAD Patient is currently stable. She is on Effient, Ranexa, aspirin and Imdur as an outpatient.   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 No evidence of acute bleeding. In setting of chronic kidney disease. Stable.   Rash Maculopapular rash on arms and upper legs. Presumed secondary to antibiotics. Ceftriaxone discontinued. Rash on right upper thigh appears to be becoming more confluent resembling purpura associated tenderness Patient was treated Solu-medrol 40 mg x1  with continue hydroxyzine and  Sarna lotion as needed.  Continue topical steroids.   Morbid Obesity Body mass index is 54.45 kg/m.  Placing the pt at higher risk of poor outcomes.   Bilateral jaw pain Had a CT maxillofacial earlier during the hospitalization which was unremarkable for any acute abnormality or fracture. Patient had a fall after the CT scan although patient reports the pain is about the same in character just worsened in intensity. I do not suspect patient requires further imaging for now.  Subjective: Awake and interactive although unable to maintain attention.  No nausea no vomiting  Physical Exam: Vitals:   12/28/21 0359 12/28/21 0627 12/28/21 1040 12/28/21 1750  BP: (!) 127/49 112/61 (!) 128/48 (!) 117/55  Pulse: 91  91 91  Resp: 19  18   Temp: 98.9 F (37.2 C)  98.8 F (37.1 C)   TempSrc: Oral  Oral   SpO2: 98%  97% 99%  Weight: (!) 157.7 kg     Height:       General: Appear in mild distress; no visible Abnormal Neck Mass Or lumps, Conjunctiva normal, dry skin.  Erythema resolved. Cardiovascular: S1 and S2 Present, no Murmur, Respiratory: good respiratory effort, Bilateral Air entry present and CTA, no Crackles, no wheezes Abdomen: Bowel Sound present, Non tender Extremities: no Pedal edema Neurology: alert and oriented to Self, Place and time.  Data Reviewed: I have Reviewed nursing notes, Vitals, and Lab results since pt's last encounter. Pertinent lab results CBC and BMP I have ordered test including CBC and BMP I have discussed pt's care plan and test results with palliative care and nephrology.     Family Communication: No one at bedside.  Disposition: Status is: Inpatient Remains inpatient appropriate because: Severely uremic.  Requires HD.  Author: Berle Mull, MD 12/28/2021 7:49 PM  Please look on [email or web address] to find out who is on call.

## 2021-12-29 DIAGNOSIS — E11621 Type 2 diabetes mellitus with foot ulcer: Secondary | ICD-10-CM | POA: Diagnosis not present

## 2021-12-29 DIAGNOSIS — L280 Lichen simplex chronicus: Secondary | ICD-10-CM | POA: Diagnosis present

## 2021-12-29 DIAGNOSIS — L97419 Non-pressure chronic ulcer of right heel and midfoot with unspecified severity: Secondary | ICD-10-CM | POA: Diagnosis not present

## 2021-12-29 LAB — RENAL FUNCTION PANEL
Albumin: 1.9 g/dL — ABNORMAL LOW (ref 3.5–5.0)
Anion gap: 9 (ref 5–15)
BUN: 161 mg/dL — ABNORMAL HIGH (ref 6–20)
CO2: 22 mmol/L (ref 22–32)
Calcium: 8.6 mg/dL — ABNORMAL LOW (ref 8.9–10.3)
Chloride: 101 mmol/L (ref 98–111)
Creatinine, Ser: 4.5 mg/dL — ABNORMAL HIGH (ref 0.44–1.00)
GFR, Estimated: 11 mL/min — ABNORMAL LOW (ref >60)
Glucose, Bld: 244 mg/dL — ABNORMAL HIGH (ref 70–99)
Phosphorus: 5.4 mg/dL — ABNORMAL HIGH (ref 2.5–4.6)
Potassium: 3.8 mmol/L (ref 3.5–5.1)
Sodium: 132 mmol/L — ABNORMAL LOW (ref 135–145)

## 2021-12-29 LAB — HEPATITIS C ANTIBODY: HCV Ab: NONREACTIVE

## 2021-12-29 LAB — GLUCOSE, CAPILLARY
Glucose-Capillary: 208 mg/dL — ABNORMAL HIGH (ref 70–99)
Glucose-Capillary: 237 mg/dL — ABNORMAL HIGH (ref 70–99)
Glucose-Capillary: 223 mg/dL — ABNORMAL HIGH (ref 70–99)
Glucose-Capillary: 281 mg/dL — ABNORMAL HIGH (ref 70–99)

## 2021-12-29 LAB — CBC
HCT: 22 % — ABNORMAL LOW (ref 36.0–46.0)
Hemoglobin: 7.4 g/dL — ABNORMAL LOW (ref 12.0–15.0)
MCH: 27.8 pg (ref 26.0–34.0)
MCHC: 33.6 g/dL (ref 30.0–36.0)
MCV: 82.7 fL (ref 80.0–100.0)
Platelets: 143 10*3/uL — ABNORMAL LOW (ref 150–400)
RBC: 2.66 MIL/uL — ABNORMAL LOW (ref 3.87–5.11)
RDW: 16 % — ABNORMAL HIGH (ref 11.5–15.5)
WBC: 10.3 10*3/uL (ref 4.0–10.5)
nRBC: 0.6 % — ABNORMAL HIGH (ref 0.0–0.2)

## 2021-12-29 LAB — HEPATITIS B SURFACE ANTIGEN: Hepatitis B Surface Ag: NONREACTIVE

## 2021-12-29 LAB — MAGNESIUM: Magnesium: 2.3 mg/dL (ref 1.7–2.4)

## 2021-12-29 LAB — HEPATITIS B CORE ANTIBODY, TOTAL: Hep B Core Total Ab: NONREACTIVE

## 2021-12-29 LAB — HEPATITIS B SURFACE ANTIBODY,QUALITATIVE: Hep B S Ab: NONREACTIVE

## 2021-12-29 MED ORDER — INSULIN GLARGINE-YFGN 100 UNIT/ML ~~LOC~~ SOLN
28.0000 [IU] | Freq: Two times a day (BID) | SUBCUTANEOUS | Status: DC
  Administered 2021-12-29 – 2022-01-06 (×15): 28 [IU] via SUBCUTANEOUS
  Filled 2021-12-29 (×17): qty 0.28

## 2021-12-29 MED ORDER — VANCOMYCIN HCL 10 G IV SOLR
2250.0000 mg | Freq: Once | INTRAVENOUS | Status: AC
  Administered 2021-12-29: 2250 mg via INTRAVENOUS
  Filled 2021-12-29: qty 40

## 2021-12-29 MED ORDER — VANCOMYCIN HCL IN DEXTROSE 1-5 GM/200ML-% IV SOLN: 1000.0000 mg | Freq: Once | INTRAVENOUS | Status: DC

## 2021-12-29 MED ORDER — CHLORHEXIDINE GLUCONATE CLOTH 2 % EX PADS
6.0000 | MEDICATED_PAD | Freq: Every day | CUTANEOUS | Status: DC
  Administered 2021-12-29 – 2022-01-03 (×5): 6 via TOPICAL

## 2021-12-29 NOTE — Progress Notes (Signed)
   12/29/21 1600  Mobility  Activity Refused mobility   Mobility Specialist Progress Note  Pt refused mobility d/t "Not feeling like it". Will f/u as able.    Lucious Groves Mobility Specialist

## 2021-12-29 NOTE — Progress Notes (Addendum)
Progress Note Patient: [name] TFT:[id number] DOB: 28-Feb-1966 DOA: 12/17/2021  DOS: the patient was seen and examined on 12/29/2021  Brief hospital course: [name] is a 56 y.o. female with a history of CAD s/p CABG, CKD 4, chronic HFpEF, type II DM, OSA.  Presented with right foot ulcer, AKI.  Vascular surgery, podiatry and nephrology following. Currently no plans for surgery.  Cultures are negative. Has progressive AKI on CKD 4 with multiple uremic symptoms.  Nephrology recommending to consider HD but patient continues to refuse as she believes her faith will help her kidneys.  Palliative care consulted. As of 10/9 patient finally agreed for HD.  IR consult for HD catheter placement.  N.p.o. after midnight for procedure scheduled tomorrow on 10/10. Assessment and Plan: Acute renal failure superimposed on stage 4 chronic kidney disease Prior history of CKD 4. Appears to be progressively worsening for last 3 months. Now with overt uremia and asterixis and myoclonic jerks. Appears to also have itching. Patient has been refusing HD for last few days now agreeable. Nephrology consult appreciated. IR consulted for HD catheter placement [facility] placement scheduled for 10/10. Lovenox on hold.  Diabetic foot ulcer Podiatry consulted.  Dr. Loel Lofty.  Vascular surgery consulted.  MRI right foot no evidence of osteomyelitis or abscess in the right forefoot. Vascular surgery recommended watchful waiting and local wound care as patient not a good candidate for angiogram due to CKD. Podiatry recommended wound care and WBAT to right LE. Patient presented with multiple antibiotics which was eventually discontinued due to concern with side effects. Currently no evidence of systemic infection.  Peripheral artery disease Vascular surgery was consulted for diabetic foot ulceration. Not a good candidate for vascular surgery for conventional angiography without significant risk for worsening of her  renal disease for which they recommended watchful waiting and local wound care. ABI 8/8 shows biphasic waveform in ankles on the right and triphasic on left ankle. Perhaps once the patient is on the hemodialysis angiogram can be initiated.  Acute metabolic encephalopathy. Due to uremia associated with myoclonic jerks. Monitor for now.  Finally patient okay for initiation of HD.  Hopefully HD initiated on 10/10.  Left hip Pain. X-ray of the hip was concerning for a possible hip fracture although CT hip was negative.  Monitor.  Bilateral jaw pain. A CT maxillofacial performed earlier in the hospital stay was negative for any acute abnormality.  Unwitnessed fall. Patient had an unwitnessed fall on 10/3.  Currently no focal deficit.  No fractures seen so far as well.  Monitor.  Type of diabetes mellitus, uncontrolled with hyperglycemia with renal complication with long-term insulin use.  Along with neuropathy On Basaglar at home.  Currently on Semglee 28 units twice daily. Also on moderate sliding scale insulin. Continue gabapentin for nerve pain.  Hypertension Blood pressure stable. On Coreg 12.5 mg twice daily. And Imdur 120 mg daily. Also on torsemide 80 mg twice daily. Currently on Coreg, torsemide, Imdur  CAD (coronary artery disease) On Ranexa.  81 mg aspirin.  On Effient at home.  Currently on hold. Currently no chest pain.  Chronic diastolic heart failure  Volume status elevated due to progression to ESRD. Hopefully management can be done with HD. Currently on torsemide.  Obesity, morbid, BMI 50 or higher (HCC) OSA (obstructive sleep apnea) Body mass index is 54.24 kg/m.  Placing the pt at higher risk of poor outcomes. Continue CPAP nightly.  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 May require transfusion.  May require iron and EPO.  Elevated LFTs Resolved. RUQ with cholelithiasis without cholecystitis. Monitor. We will recheck  tomorrow.  Subjective: Denies any acute complaint.  No nausea or vomiting.  No fever no chills.  Was undecided at the time of my evaluation but appears to have agreed with nephrology for HD.  Has multiple myoclonic jerks throughout my encounter.  Physical Exam: Vitals:   12/29/21 0315 12/29/21 0635 12/29/21 0826 12/29/21 1142  BP: 103/68 113/71 114/60 (!) 102/53  Pulse: 86 77  82  Resp: '18  20 20  '$ Temp: 98.2 F (36.8 C)   98.2 F (36.8 C)  TempSrc: Oral   Oral  SpO2: 98%  100% 98%  Weight: (!) 157.1 kg     Height:       General: Appear in moderate distress; no visible Abnormal Neck Mass Or lumps, Conjunctiva normal Cardiovascular: S1 and S2 Present, no Murmur, Respiratory: good respiratory effort, Bilateral Air entry present and faint Crackles, no wheezes Abdomen: Bowel Sound present, Non tender  Extremities: bilateral Pedal edema Neurology: alert and oriented to time, place, and person with frequent myoclonic jerks Gait not checked due to patient safety concerns   Data Reviewed: I have Reviewed nursing notes, Vitals, and Lab results since pt's last encounter. Pertinent lab results CBC and BMP I have ordered test including CBC and BMP I have discussed pt's care plan and test results with nephrology and palliative care.   Family Communication: No one at bedside  Disposition: Status is: Inpatient Remains inpatient appropriate because: Need for hemodialysis  SCDs Start: 12/17/21 0358 Lovenox on hold currently for [facility] placement. Level of care: Telemetry Medical Telemetry reviewed, shows intermittent sinus tachycardia and Continue Telemetry due to acute renal failure, encephalopathy with high risk for rapid decompensation   Author: Berle Mull, MD 12/29/2021 6:03 PM Please look on [email or web address] to find out who is on call.
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
## 2021-12-29 NOTE — Progress Notes (Signed)
Patient ID: [name], female   DOB: [birth year], 56 y.o.   MRN: [id number] S: No events overnight.  She does not want long term dialysis but agreed to short term trial after a lengthy discussion at the bedside. O:BP 114/60   Pulse 77   Temp 98.2 F (36.8 C) (Oral)   Resp 20   Ht '5\' 7"'$  (1.702 m)   Wt (!) 157.1 kg   SpO2 100%   BMI 54.24 kg/m   Intake/Output Summary (Last 24 hours) at 12/29/2021 1019 Last data filed at 12/29/2021 0900 Gross per 24 hour  Intake 300 ml  Output 850 ml  Net -550 ml   Intake/Output: I/O last 3 completed shifts: In: 480 [P.O.:480] Out: 1400 [Urine:1400]  Intake/Output this shift:  Total I/O In: 240 [P.O.:240] Out: 250 [Urine:250] Weight change: -0.6 kg GGY:IRSWNI mentation, no apparent distress CVS: RRR Resp: CTA Abd: +BS, obese, NT/ND Ext: brawny edema bilaterally Neuro: + asterixis.  Recent Labs  Lab 12/23/21 0036 12/24/21 6270 12/25/21 0044 12/26/21 0034 12/27/21 0050 12/28/21 0044 12/29/21 0258  NA 134* 131* 129* 132* 130* 130* 132*  K 3.6 4.0 3.7 3.7 3.9 3.6 3.8  CL 99 97* 98 98 97* 97* 101  CO2 20* 19* 18* 21* 19* 20* 22  GLUCOSE 116* 96 220* 218* 178* 242* 244*  BUN 104* 118* 128* 141* 148* 155* 161*  CREATININE 4.12* 4.50* 4.45* 4.43* 4.64* 4.89* 4.50*  ALBUMIN  --  2.1* 2.1* 2.2* 2.2* 2.0* 1.9*  CALCIUM 9.2 9.0 8.9 9.0 8.8* 8.6* 8.6*  PHOS  --  5.7* 6.5* 5.6* 5.3* 5.1* 5.4*   Liver Function Tests: Recent Labs  Lab 12/23/21 1552 12/24/21 0653 12/27/21 0050 12/28/21 0044 12/29/21 0258  BILITOT 0.8  --   --   --   --   ALBUMIN  --    < > 2.2* 2.0* 1.9*   < > = values in this interval not displayed.   No results for input(s): "LIPASE", "AMYLASE" in the last 168 hours. No results for input(s): "AMMONIA" in the last 168 hours. CBC: Recent Labs  Lab 12/26/21 0034 12/27/21 0050 12/28/21 0538 12/28/21 1055 12/29/21 0258  WBC 11.8* 13.7* 11.5* 11.5* 10.3  HGB 7.9* 8.3* 7.1* 7.5* 7.4*  HCT 24.6* 25.4* 21.8* 22.3*  22.0*  MCV 83.7 83.0 82.0 81.7 82.7  PLT 177 187 146* 146* 143*   Cardiac Enzymes: No results for input(s): "CKTOTAL", "CKMB", "CKMBINDEX", "TROPONINI" in the last 168 hours. CBG: Recent Labs  Lab 12/28/21 0620 12/28/21 1109 12/28/21 1552 12/28/21 2130 12/29/21 0617  GLUCAP 247* 209* 208* 200* 208*    Iron Studies: No results for input(s): "IRON", "TIBC", "TRANSFERRIN", "FERRITIN" in the last 72 hours. Studies/Results: No results found.  (feeding supplement) PROSource Plus  30 mL Oral TID BM   carvedilol  12.5 mg Oral BID WC   feeding supplement  237 mL Oral BID BM   gabapentin  100 mg Oral QHS   insulin aspart  0-15 Units Subcutaneous TID WC   insulin aspart  0-5 Units Subcutaneous QHS   insulin glargine-yfgn  28 Units Subcutaneous BID   isosorbide mononitrate  60 mg Oral q morning   rosuvastatin  5 mg Oral Daily   torsemide  40 mg Oral BID   triamcinolone cream   Topical BID    BMET    Component Value Date/Time   NA 132 (L) 12/29/2021 0258   K 3.8 12/29/2021 0258   CL 101 12/29/2021 0258   CO2  22 12/29/2021 0258   GLUCOSE 244 (H) 12/29/2021 0258   BUN 161 (H) 12/29/2021 0258   CREATININE 4.50 (H) 12/29/2021 0258   CALCIUM 8.6 (L) 12/29/2021 0258   GFRNONAA 11 (L) 12/29/2021 0258   GFRAA 33 (L) 06/15/2019 1237   CBC    Component Value Date/Time   WBC 10.3 12/29/2021 0258   RBC 2.66 (L) 12/29/2021 0258   HGB 7.4 (L) 12/29/2021 0258   HCT 22.0 (L) 12/29/2021 0258   PLT 143 (L) 12/29/2021 0258   MCV 82.7 12/29/2021 0258   MCH 27.8 12/29/2021 0258   MCHC 33.6 12/29/2021 0258   RDW 16.0 (H) 12/29/2021 0258   LYMPHSABS 2.4 02/24/2010 1320   MONOABS 0.6 02/24/2010 1320   EOSABS 0.1 02/24/2010 1320   BASOSABS 0.0 02/24/2010 1320     Assessment/Plan:  AKI/CKD stage IV - has had multiple episodes of AKI/CKD with progressively worsening renal function over the past 3 months.  Now with overt uremia and asterixis.  She is willing to proceed with HD on short  term trial of 1 week and then decide whether she would want to continue or stop HD.  I feel she will better be able to make decisions, once her uremia is treated.  Will consult IR for HD catheter placement.  Would proceed with [facility] as her family has been pushing her to start dialysis and will likely improve her mentation with HD.  Diabetic foot ulcer/infection of right leg - evaluated by VVS but arteriogram on hold due to worsening renal function. HTN - stable Chronic diastolic CHF - stable Hyponatremia - due to AKI and impaired free water handling.  Continue to follow Anemia of CKD stage IV - low iron.  Will start IV iron with HD and likely add ESA.  Donetta Potts, MD [facility]

## 2021-12-29 NOTE — Progress Notes (Signed)
Inpatient Diabetes Program Recommendations  AACE/ADA: New Consensus Statement on Inpatient Glycemic Control (2015)  Target Ranges:  Prepandial:   less than 140 mg/dL      Peak postprandial:   less than 180 mg/dL (1-2 hours)      Critically ill patients:  140 - 180 mg/dL   Lab Results  Component Value Date   GLUCAP 208 (H) 12/29/2021   HGBA1C 6.9 (H) 10/25/2021    Review of Glycemic Control  Latest Reference Range & Units 12/28/21 11:09 12/28/21 15:52 12/28/21 21:30 12/29/21 06:17  Glucose-Capillary 70 - 99 mg/dL 209 (H) 208 (H) 200 (H) 208 (H)   Diabetes history: DM 2 Outpatient Diabetes medications:  Lantus 54 units bid, Novolog 20 units tid with meals Current orders for Inpatient glycemic control:  Novolog 0-15 units tid with meals  Semglee 25 units bid  Inpatient Diabetes Program Recommendations:    Consider increasing Semglee to 30 units bid.   Thanks,  Adah Perl, RN, BC-ADM Inpatient Diabetes Coordinator Pager [phone]  (8a-5p)

## 2021-12-29 NOTE — Progress Notes (Signed)
PT Cancellation Note  Patient Details Name: [name] MRN: [id number] DOB: 1966-01-14   Cancelled Treatment:    Reason Eval/Treat Not Completed: Other (comment).   Eating lunch and upset with PT arrival, will retry as time and pt allow.   Ramond Dial 12/29/2021, 1:28 PM  Mee Hives, PT PhD Acute Rehab Dept. Number: [place] and [place]

## 2021-12-29 NOTE — Consult Note (Signed)
WOC consulted for right foot wound, wound has healed since admission, patient on admission day 12.  DCed orders for silver and topper. Added just silicone foam to protect vunerable site.  Re consult if needed, will not follow at this time. Thanks  Danetra Glock [employer], RN,CWOCN, CNS, [place] [phone])

## 2021-12-29 NOTE — Progress Notes (Signed)
PT Cancellation Note  Patient Details Name: [name] MRN: [id number] DOB: December 14, 1965   Cancelled Treatment:    Reason Eval/Treat Not Completed: Other (comment).  Refusing PT this AM and will retry at another time.   Ramond Dial 12/29/2021, 11:08 AM  Mee Hives, PT PhD Acute Rehab Dept. Number: [place] and [place]

## 2021-12-30 ENCOUNTER — Inpatient Hospital Stay (HOSPITAL_COMMUNITY): Payer: Medicare Other

## 2021-12-30 DIAGNOSIS — L97419 Non-pressure chronic ulcer of right heel and midfoot with unspecified severity: Secondary | ICD-10-CM | POA: Diagnosis not present

## 2021-12-30 DIAGNOSIS — Z515 Encounter for palliative care: Secondary | ICD-10-CM | POA: Diagnosis not present

## 2021-12-30 DIAGNOSIS — I2510 Atherosclerotic heart disease of native coronary artery without angina pectoris: Secondary | ICD-10-CM | POA: Diagnosis not present

## 2021-12-30 DIAGNOSIS — E11621 Type 2 diabetes mellitus with foot ulcer: Secondary | ICD-10-CM | POA: Diagnosis not present

## 2021-12-30 DIAGNOSIS — N179 Acute kidney failure, unspecified: Secondary | ICD-10-CM | POA: Diagnosis not present

## 2021-12-30 HISTORY — PX: IR FLUORO GUIDE CV LINE RIGHT: IMG2283

## 2021-12-30 HISTORY — PX: IR US GUIDE VASC ACCESS RIGHT: IMG2390

## 2021-12-30 LAB — CBC
HCT: 24.1 % — ABNORMAL LOW (ref 36.0–46.0)
HCT: 23 % — ABNORMAL LOW (ref 36.0–46.0)
Hemoglobin: 7.5 g/dL — ABNORMAL LOW (ref 12.0–15.0)
Hemoglobin: 7.6 g/dL — ABNORMAL LOW (ref 12.0–15.0)
MCH: 26.4 pg (ref 26.0–34.0)
MCH: 27.2 pg (ref 26.0–34.0)
MCHC: 31.1 g/dL (ref 30.0–36.0)
MCHC: 33 g/dL (ref 30.0–36.0)
MCV: 84.9 fL (ref 80.0–100.0)
MCV: 82.4 fL (ref 80.0–100.0)
Platelets: 147 10*3/uL — ABNORMAL LOW (ref 150–400)
Platelets: 135 10*3/uL — ABNORMAL LOW (ref 150–400)
RBC: 2.84 MIL/uL — ABNORMAL LOW (ref 3.87–5.11)
RBC: 2.79 MIL/uL — ABNORMAL LOW (ref 3.87–5.11)
RDW: 16.1 % — ABNORMAL HIGH (ref 11.5–15.5)
RDW: 16.2 % — ABNORMAL HIGH (ref 11.5–15.5)
WBC: 9.3 10*3/uL (ref 4.0–10.5)
WBC: 7.4 10*3/uL (ref 4.0–10.5)
nRBC: 0.7 % — ABNORMAL HIGH (ref 0.0–0.2)
nRBC: 0.5 % — ABNORMAL HIGH (ref 0.0–0.2)

## 2021-12-30 LAB — RENAL FUNCTION PANEL
Albumin: 2 g/dL — ABNORMAL LOW (ref 3.5–5.0)
Albumin: 2 g/dL — ABNORMAL LOW (ref 3.5–5.0)
Anion gap: 13 (ref 5–15)
Anion gap: 11 (ref 5–15)
BUN: 155 mg/dL — ABNORMAL HIGH (ref 6–20)
BUN: 150 mg/dL — ABNORMAL HIGH (ref 6–20)
CO2: 20 mmol/L — ABNORMAL LOW (ref 22–32)
CO2: 22 mmol/L (ref 22–32)
Calcium: 8.4 mg/dL — ABNORMAL LOW (ref 8.9–10.3)
Calcium: 8.8 mg/dL — ABNORMAL LOW (ref 8.9–10.3)
Chloride: 98 mmol/L (ref 98–111)
Chloride: 105 mmol/L (ref 98–111)
Creatinine, Ser: 4.16 mg/dL — ABNORMAL HIGH (ref 0.44–1.00)
Creatinine, Ser: 3.72 mg/dL — ABNORMAL HIGH (ref 0.44–1.00)
GFR, Estimated: 12 mL/min — ABNORMAL LOW (ref >60)
GFR, Estimated: 14 mL/min — ABNORMAL LOW (ref >60)
Glucose, Bld: 248 mg/dL — ABNORMAL HIGH (ref 70–99)
Glucose, Bld: 180 mg/dL — ABNORMAL HIGH (ref 70–99)
Phosphorus: 4.8 mg/dL — ABNORMAL HIGH (ref 2.5–4.6)
Phosphorus: 4.5 mg/dL (ref 2.5–4.6)
Potassium: 3.7 mmol/L (ref 3.5–5.1)
Potassium: 3.8 mmol/L (ref 3.5–5.1)
Sodium: 131 mmol/L — ABNORMAL LOW (ref 135–145)
Sodium: 138 mmol/L (ref 135–145)

## 2021-12-30 LAB — HEPATIC FUNCTION PANEL
ALT: 21 U/L (ref 0–44)
AST: 27 U/L (ref 15–41)
Albumin: 2 g/dL — ABNORMAL LOW (ref 3.5–5.0)
Alkaline Phosphatase: 30 U/L — ABNORMAL LOW (ref 38–126)
Bilirubin, Direct: 0.4 mg/dL — ABNORMAL HIGH (ref 0.0–0.2)
Indirect Bilirubin: 0.5 mg/dL (ref 0.3–0.9)
Total Bilirubin: 0.9 mg/dL (ref 0.3–1.2)
Total Protein: 6.9 g/dL (ref 6.5–8.1)

## 2021-12-30 LAB — GLUCOSE, CAPILLARY
Glucose-Capillary: 229 mg/dL — ABNORMAL HIGH (ref 70–99)
Glucose-Capillary: 210 mg/dL — ABNORMAL HIGH (ref 70–99)
Glucose-Capillary: 184 mg/dL — ABNORMAL HIGH (ref 70–99)
Glucose-Capillary: 221 mg/dL — ABNORMAL HIGH (ref 70–99)

## 2021-12-30 LAB — MAGNESIUM: Magnesium: 2.1 mg/dL (ref 1.7–2.4)

## 2021-12-30 LAB — HEPATITIS B SURFACE ANTIBODY, QUANTITATIVE: Hep B S AB Quant (Post): <3.1 m[IU]/mL — ABNORMAL LOW (ref >9.9)

## 2021-12-30 MED ORDER — PENTAFLUOROPROP-TETRAFLUOROETH EX AERO: 1.0000 | INHALATION_SPRAY | CUTANEOUS | Status: DC | PRN

## 2021-12-30 MED ORDER — ANTICOAGULANT SODIUM CITRATE 4% (200MG/5ML) IV SOLN: 5.0000 mL | Status: DC | PRN

## 2021-12-30 MED ORDER — FENTANYL CITRATE (PF) 100 MCG/2ML IJ SOLN
INTRAMUSCULAR | Status: AC | PRN
  Administered 2021-12-30: 25 ug via INTRAVENOUS

## 2021-12-30 MED ORDER — HEPARIN SODIUM (PORCINE) 1000 UNIT/ML IJ SOLN
INTRAMUSCULAR | Status: AC
  Administered 2021-12-30: 3.2 mL
  Filled 2021-12-30: qty 10

## 2021-12-30 MED ORDER — VANCOMYCIN HCL 1000 MG IV SOLR: INTRAVENOUS | Status: AC | PRN

## 2021-12-30 MED ORDER — LIDOCAINE HCL (PF) 1 % IJ SOLN: 5.0000 mL | INTRAMUSCULAR | Status: DC | PRN

## 2021-12-30 MED ORDER — ALTEPLASE 2 MG IJ SOLR: 2.0000 mg | Freq: Once | INTRAMUSCULAR | Status: DC | PRN

## 2021-12-30 MED ORDER — HEPARIN SODIUM (PORCINE) 1000 UNIT/ML DIALYSIS
1000.0000 [IU] | INTRAMUSCULAR | Status: DC | PRN
  Administered 2021-12-31: 1000 [IU]
  Filled 2021-12-30 (×2): qty 1

## 2021-12-30 MED ORDER — LIDOCAINE-EPINEPHRINE 1 %-1:100000 IJ SOLN
INTRAMUSCULAR | Status: AC
  Administered 2021-12-30: 10 mL
  Filled 2021-12-30: qty 1

## 2021-12-30 MED ORDER — LIDOCAINE-PRILOCAINE 2.5-2.5 % EX CREA: 1.0000 | TOPICAL_CREAM | CUTANEOUS | Status: DC | PRN

## 2021-12-30 MED ORDER — MIDAZOLAM HCL 2 MG/2ML IJ SOLN
INTRAMUSCULAR | Status: AC
  Filled 2021-12-30: qty 2

## 2021-12-30 MED ORDER — FENTANYL CITRATE (PF) 100 MCG/2ML IJ SOLN
INTRAMUSCULAR | Status: AC
  Filled 2021-12-30: qty 2

## 2021-12-30 MED ORDER — NALOXONE HCL 0.4 MG/ML IJ SOLN
INTRAMUSCULAR | Status: AC
  Filled 2021-12-30: qty 1

## 2021-12-30 MED ORDER — GELATIN ABSORBABLE 12-7 MM EX MISC
CUTANEOUS | Status: AC
  Filled 2021-12-30: qty 1

## 2021-12-30 MED ORDER — VANCOMYCIN HCL IN DEXTROSE 1-5 GM/200ML-% IV SOLN
INTRAVENOUS | Status: AC
  Filled 2021-12-30: qty 200

## 2021-12-30 NOTE — Progress Notes (Signed)
Pt not in room at this time.  Rn will call RT when pt returns.

## 2021-12-30 NOTE — Plan of Care (Signed)
  Problem: Education: Goal: Knowledge of General Education information will improve Description: Including pain rating scale, medication(s)/side effects and non-pharmacologic comfort measures Outcome: Progressing   Problem: Health Behavior/Discharge Planning: Goal: Ability to manage health-related needs will improve Outcome: Progressing   Problem: Activity: Goal: Risk for activity intolerance will decrease Outcome: Progressing   Problem: Elimination: Goal: Will not experience complications related to bowel motility Outcome: Progressing   Problem: Elimination: Goal: Will not experience complications related to urinary retention Outcome: Progressing   Problem: Pain Managment: Goal: General experience of comfort will improve Outcome: Progressing

## 2021-12-30 NOTE — Progress Notes (Signed)
Physical Therapy Treatment Patient Details Name: [name] MRN: [id number] DOB: [birth year] Today's Date: 12/30/2021   History of Present Illness Pt is a 56 y/o F who presented to [facility] on 12/11/21 with R foot ulcers with concern of infection, transferred to [facility] on 9/27. Imaging revealed R foot swelling, no clear evidence of osteomyelitis. Workup for Rt foot diabetic infection, AKI on CKD, and fever. 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 Pt hesitantly agreeable to mobilize for clean up and new bedding. Pt needed min A to come to EOB and could not scoot to EOB without physical assist. Pt transferred with HHA +2 to [facility] with mild buckling of L knee. With subsequent transfers RW was used which gave her more stability but still had difficulty stepping feet due to fatigue and weakness. Worked on standing balance and increasing tolerance for standing as well as unsupported sitting. Pt from home alone and at this point not progressing at a rate that will be possible for her to return home without assist. Changing d/c rec to SNF, though pt wants to return home. PT will continue to follow.    Recommendations for follow up therapy are one component of a multi-disciplinary discharge planning process, led by the attending physician.  Recommendations may be updated based on patient status, additional functional criteria and insurance authorization.  Follow Up Recommendations  Skilled nursing-short term rehab (<3 hours/day) Can patient physically be transported by private vehicle: No   Assistance Recommended at Discharge Frequent or constant Supervision/Assistance  Patient can return home with the following A lot of help with walking and/or  transfers;A lot of help with bathing/dressing/bathroom;Assistance with cooking/housework;Assist for transportation;Help with stairs or ramp for entrance   Equipment Recommendations  None recommended by PT    Recommendations for Other Services       Precautions / Restrictions Precautions Precautions: Fall Restrictions Weight Bearing Restrictions: No RLE Weight Bearing: Weight bearing as tolerated     Mobility  Bed Mobility Overal bed mobility: Needs Assistance Bed Mobility: Supine to Sit, Sit to Supine     Supine to sit: Min assist Sit to supine: Mod assist   General bed mobility comments: pt able to get LE's off bed to sit up but needed min A for elevation of trunk into sitting and scooting R hip to EOB. Needed mod A for LE's to return to supine    Transfers Overall transfer level: Needs assistance Equipment used: Rolling walker (2 wheels) Transfers: Sit to/from Stand, Bed to chair/wheelchair/BSC Sit to Stand: Min assist, +2 safety/equipment Stand pivot transfers: +2 safety/equipment, Min assist         General transfer comment: L knee partially buckling with first transfer with HHA x2, better with second transfer with RW. Pt has difficulty stepping feet    Ambulation/Gait               General Gait Details: unable   Stairs             Wheelchair Mobility    Modified Rankin (Stroke Patients Only)       Balance Overall balance assessment: Needs assistance Sitting-balance support: Feet supported, No upper extremity supported Sitting balance-Leahy Scale: Fair     Standing balance support: Bilateral upper extremity  supported, During functional activity Standing balance-Leahy Scale: Poor Standing balance comment: reliant on UE support and min A               High Level Balance Comments: worked on increasing tolerance for standing with UE support, 2 mins            Cognition Arousal/Alertness: Awake/alert Behavior During Therapy:  Flat affect Overall Cognitive Status: Within Functional Limits for tasks assessed                                 General Comments: pt appropriate but does not seem to comprehend that her function is not adequate to go home alone        Exercises      General Comments General comments (skin integrity, edema, etc.): rash improving and now pt shedding a lot of skin.      Pertinent Vitals/Pain Pain Assessment Pain Assessment: Faces Faces Pain Scale: Hurts even more Pain Location: L knee, shoulders Pain Descriptors / Indicators: Grimacing, Sore Pain Intervention(s): Limited activity within patient's tolerance, Monitored during session    Home Living                          Prior Function            PT Goals (current goals can now be found in the care plan section) Acute Rehab PT Goals Patient Stated Goal: to go home PT Goal Formulation: With patient Time For Goal Achievement: 01/06/22 Potential to Achieve Goals: Good Progress towards PT goals: Progressing toward goals    Frequency    Min 3X/week      PT Plan Discharge plan needs to be updated    Co-evaluation              AM-PAC PT "6 Clicks" Mobility   Outcome Measure  Help needed turning from your back to your side while in a flat bed without using bedrails?: A Lot Help needed moving from lying on your back to sitting on the side of a flat bed without using bedrails?: A Lot Help needed moving to and from a bed to a chair (including a wheelchair)?: A Lot Help needed standing up from a chair using your arms (e.g., wheelchair or bedside chair)?: A Lot Help needed to walk in hospital room?: Total Help needed climbing 3-5 steps with a railing? : Total 6 Click Score: 10    End of Session   Activity Tolerance: Patient limited by fatigue Patient left: in bed;with call bell/phone within reach;with bed alarm set Nurse Communication: Mobility status PT Visit Diagnosis: Muscle  weakness (generalized) (M62.81)     Time: 2836-6294 PT Time Calculation (min) (ACUTE ONLY): 47 min  Charges:  $Therapeutic Activity: 23-37 mins $Neuromuscular Re-education: 8-22 mins                     Leighton Roach, PT  Acute Rehab Services Secure chat preferred Office [place] 12/30/2021, 1:34 PM

## 2021-12-30 NOTE — Plan of Care (Signed)
  Problem: Education: Goal: Knowledge of General Education information will improve Description: Including pain rating scale, medication(s)/side effects and non-pharmacologic comfort measures Outcome: Progressing   Problem: Health Behavior/Discharge Planning: Goal: Ability to manage health-related needs will improve Outcome: Progressing   Problem: Activity: Goal: Risk for activity intolerance will decrease Outcome: Progressing   

## 2021-12-30 NOTE — Procedures (Signed)
Interventional Radiology Procedure Note  Procedure: Placement of a right IJ approach tunneled HD catheter.  Tip is positioned at the superior cavoatrial junction and catheter is ready for immediate use.  Complications: None Recommendations:  - OK to use - Do not submerge - Routine line care   Signed,  Dulcy Fanny. Earleen Newport, DO

## 2021-12-30 NOTE — Inpatient Diabetes Management (Signed)
Inpatient Diabetes Program Recommendations  AACE/ADA: New Consensus Statement on Inpatient Glycemic Control (2015)  Target Ranges:  Prepandial:   less than 140 mg/dL      Peak postprandial:   less than 180 mg/dL (1-2 hours)      Critically ill patients:  140 - 180 mg/dL     Review of Glycemic Control  Latest Reference Range & Units 12/28/21 21:30 12/29/21 06:17 12/29/21 11:39 12/29/21 15:46 12/29/21 21:03 12/30/21 06:23  Glucose-Capillary 70 - 99 mg/dL 200 (H) 208 (H) 237 (H) 223 (H) 281 (H) 229 (H)  Diabetes history: DM 2 Outpatient Diabetes medications:  Lantus 54 units bid, Novolog 20 units tid with meals Current orders for Inpatient glycemic control:  Novolog 0-15 units tid with meals  Semglee 28 units bid Inpatient Diabetes Program Recommendations:   Per documentation, patient only received one dose of Semglee on 12/29/21.  Consider adding Novolog meal coverage 3 units tid with meals (hold if patient eats less than 50% or NPO).   Thanks,  Adah Perl, RN, BC-ADM Inpatient Diabetes Coordinator Pager [phone]  (8a-5p)

## 2021-12-30 NOTE — Progress Notes (Signed)
Patient ID: Dniyah Grant, female   DOB: 13-May-1965, 56 y.o.   MRN: [id number] S: No complaints but a little anxious about catheter and dialysis. O:BP (!) 99/53   Pulse 89   Temp 98.2 F (36.8 C) (Oral)   Resp 18   Ht '5\' 7"'$  (1.702 m)   Wt (!) 158 kg   SpO2 97%   BMI 54.56 kg/m   Intake/Output Summary (Last 24 hours) at 12/30/2021 1135 Last data filed at 12/30/2021 0811 Gross per 24 hour  Intake 360 ml  Output 1700 ml  Net -1340 ml   Intake/Output: I/O last 3 completed shifts: In: 600 [P.O.:600] Out: 1800 [Urine:1800]  Intake/Output this shift:  Total I/O In: -  Out: 450 [Urine:450] Weight change: 0.9 kg Gen:NAD, slowed mentation CVS: RRR no rub Resp: CTA Abd: +BS, soft, NT/ND Ext: 1+ edema Neuro:  + asterixis  Recent Labs  Lab 12/24/21 0653 12/25/21 0044 12/26/21 0034 12/27/21 0050 12/28/21 0044 12/29/21 0258 12/30/21 0148  NA 131* 129* 132* 130* 130* 132* 131*  K 4.0 3.7 3.7 3.9 3.6 3.8 3.7  CL 97* 98 98 97* 97* 101 98  CO2 19* 18* 21* 19* 20* 22 20*  GLUCOSE 96 220* 218* 178* 242* 244* 248*  BUN 118* 128* 141* 148* 155* 161* 155*  CREATININE 4.50* 4.45* 4.43* 4.64* 4.89* 4.50* 4.16*  ALBUMIN 2.1* 2.1* 2.2* 2.2* 2.0* 1.9* 2.0*  CALCIUM 9.0 8.9 9.0 8.8* 8.6* 8.6* 8.4*  PHOS 5.7* 6.5* 5.6* 5.3* 5.1* 5.4* 4.8*   Liver Function Tests: Recent Labs  Lab 12/23/21 1552 12/24/21 0653 12/28/21 0044 12/29/21 0258 12/30/21 0148  BILITOT 0.8  --   --   --   --   ALBUMIN  --    < > 2.0* 1.9* 2.0*   < > = values in this interval not displayed.   No results for input(s): "LIPASE", "AMYLASE" in the last 168 hours. No results for input(s): "AMMONIA" in the last 168 hours. CBC: Recent Labs  Lab 12/27/21 0050 12/28/21 0538 12/28/21 1055 12/29/21 0258 12/30/21 0148  WBC 13.7* 11.5* 11.5* 10.3 9.3  HGB 8.3* 7.1* 7.5* 7.4* 7.5*  HCT 25.4* 21.8* 22.3* 22.0* 24.1*  MCV 83.0 82.0 81.7 82.7 84.9  PLT 187 146* 146* 143* 147*   Cardiac Enzymes: No results for  input(s): "CKTOTAL", "CKMB", "CKMBINDEX", "TROPONINI" in the last 168 hours. CBG: Recent Labs  Lab 12/29/21 1139 12/29/21 1546 12/29/21 2103 12/30/21 0623 12/30/21 1112  GLUCAP 237* 223* 281* 229* 210*    Iron Studies: No results for input(s): "IRON", "TIBC", "TRANSFERRIN", "FERRITIN" in the last 72 hours. Studies/Results: No results found.  (feeding supplement) PROSource Plus  30 mL Oral TID BM   carvedilol  12.5 mg Oral BID WC   Chlorhexidine Gluconate Cloth  6 each Topical Q0600   feeding supplement  237 mL Oral BID BM   gabapentin  100 mg Oral QHS   insulin aspart  0-15 Units Subcutaneous TID WC   insulin aspart  0-5 Units Subcutaneous QHS   insulin glargine-yfgn  28 Units Subcutaneous BID   isosorbide mononitrate  60 mg Oral q morning   rosuvastatin  5 mg Oral Daily   torsemide  40 mg Oral BID   triamcinolone cream   Topical BID    BMET    Component Value Date/Time   NA 131 (L) 12/30/2021 0148   K 3.7 12/30/2021 0148   CL 98 12/30/2021 0148   CO2 20 (L) 12/30/2021 0148   GLUCOSE  248 (H) 12/30/2021 0148   BUN 155 (H) 12/30/2021 0148   CREATININE 4.16 (H) 12/30/2021 0148   CALCIUM 8.4 (L) 12/30/2021 0148   GFRNONAA 12 (L) 12/30/2021 0148   GFRAA 33 (L) 06/15/2019 1237   CBC    Component Value Date/Time   WBC 9.3 12/30/2021 0148   RBC 2.84 (L) 12/30/2021 0148   HGB 7.5 (L) 12/30/2021 0148   HCT 24.1 (L) 12/30/2021 0148   PLT 147 (L) 12/30/2021 0148   MCV 84.9 12/30/2021 0148   MCH 26.4 12/30/2021 0148   MCHC 31.1 12/30/2021 0148   RDW 16.1 (H) 12/30/2021 0148   LYMPHSABS 2.4 02/24/2010 1320   MONOABS 0.6 02/24/2010 1320   EOSABS 0.1 02/24/2010 1320   BASOSABS 0.0 02/24/2010 1320    Assessment/Plan:   AKI/CKD stage IV - has had multiple episodes of AKI/CKD with progressively worsening renal function over the past 3 months.  Now with overt uremia and asterixis.  She is willing to proceed with HD on short term trial of 1 week and then decide whether she  would want to continue or stop HD.  I feel she will better be able to make decisions, once her uremia is treated.  Have consulted IR for HD catheter placement for today.  Would proceed with [facility] as her family has been pushing her to start dialysis and will likely improve her mentation with HD. Plan for HD after [facility] placed.   Diabetic foot ulcer/infection of right leg - evaluated by [facility]VS but arteriogram on hold due to worsening renal function. HTN - stable, hold meds prior to HD. Chronic diastolic CHF - stable Hyponatremia - due to AKI and impaired free water handling.  Continue to follow Anemia of CKD stage IV - low iron.  Will start IV iron with HD and likely add ESA.  Donetta Potts, MD [facility]

## 2021-12-30 NOTE — Progress Notes (Addendum)
Progress Note Patient: [name] HWE:[id number] DOB: 1965-08-21 DOA: 12/17/2021  DOS: the patient was seen and examined on 12/30/2021  Brief hospital course: [name] is a 56 y.o. female with a history of CAD s/p CABG, CKD 4, chronic HFpEF, type II DM, OSA.  Presented with right foot ulcer, AKI.  Vascular surgery, podiatry and nephrology following. Currently no plans for surgery.  Cultures are negative. Has progressive AKI on CKD 4 with multiple uremic symptoms.  Nephrology recommending to consider HD but patient continues to refuse as she believes her faith will help her kidneys.  Palliative care consulted. As of 10/9 patient finally agreed for HD.  IR consult for HD catheter placement.  Procedure scheduled for 10/10. Assessment and Plan: Acute renal failure superimposed on stage 4 chronic kidney disease Prior history of CKD 4. Appears to be progressively worsening for last 3 months. Serum creatinine at baseline around 2 but since mid August has been around 4. BUN has trended up from 40s in August to 160. Now with overt uremia and asterixis and myoclonic jerks. Patient has been refusing HD for last few days but as of 10/9 agreeable. Nephrology consult appreciated. IR consulted for HD catheter placement [facility] placement scheduled for 10/10. Lovenox on hold.  Patient will start HD after that.   Diabetic foot ulcer Podiatry consulted.  Dr. Loel Lofty.  Vascular surgery consulted.  MRI right foot no evidence of osteomyelitis or abscess in the right forefoot. Vascular surgery recommended watchful waiting and local wound care as patient not a good candidate for angiogram due to CKD. Podiatry recommended wound care and WBAT to right LE. Patient presented with multiple antibiotics which was eventually discontinued due to concern with side effects. Currently no evidence of systemic infection.   Peripheral artery disease Vascular surgery was consulted for diabetic foot ulceration. Not a  good candidate for vascular surgery for conventional angiography without significant risk for worsening of her renal disease for which they recommended watchful waiting and local wound care. ABI 8/8 shows biphasic waveform in ankles on the right and triphasic on left ankle. Perhaps once the patient is on the HD, angiogram can be considered.   Acute metabolic encephalopathy. Due to uremia associated with myoclonic jerks. Monitor for now.  Finally patient okay for initiation of HD.  Hopefully HD initiated on 10/10.   Left hip Pain. X-ray of the hip was concerning for a possible hip fracture although CT hip was negative. Monitor.   Bilateral jaw pain. A CT maxillofacial performed earlier in the hospital stay was negative for any acute abnormality.   Unwitnessed fall. Patient had an unwitnessed fall on 10/3.  Currently no focal deficit.  No fractures seen so far as well.  Monitor.   Type of diabetes mellitus, uncontrolled with hyperglycemia with renal complication with long-term insulin use.  Along with neuropathy On Basaglar at home.  Currently on Semglee 28 units twice daily. Also on moderate sliding scale insulin. Continue gabapentin for nerve pain.   Hypertension Blood pressure stable. On Coreg 12.5 mg twice daily. And Imdur 120 mg daily. Also on torsemide 80 mg twice daily. Currently on Coreg, torsemide, Imdur   CAD (coronary artery disease) On Ranexa.  81 mg aspirin.  On Effient at home.  Currently on hold. Currently no chest pain.   Chronic diastolic heart failure  Volume status elevated due to progression to ESRD. Hopefully management can be done with HD. Currently on torsemide.   Obesity, morbid, BMI 50 or higher (HCC) OSA (obstructive sleep  apnea) Body mass index is 54.24 kg/m.  Placing the pt at higher risk of poor outcomes. Continue CPAP nightly.   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 May require transfusion.  May require iron and  EPO.   Elevated LFTs Resolved. RUQ with cholelithiasis without cholecystitis. We will recheck.  Monitor.  Bilateral buttocks stage II pressure ulcer, first assessed on 12/28/2021. Continue foam dressing.  Subjective: No nausea no vomiting.  No fever no chills.  Reports dry mouth.  No chest pain.  No Abdo pain.  Passing gas.  No BM.  Physical Exam: Vitals:   12/29/21 1142 12/29/21 2025 12/30/21 0300 12/30/21 1142  BP: (!) 102/53 (!) 109/57 (!) 99/53 (!) 112/54  Pulse: 82 88 89 89  Resp: '20 18 18 20  '$ Temp: 98.2 F (36.8 C) 98.6 F (37 C) 98.2 F (36.8 C) 99 F (37.2 C)  TempSrc: Oral Oral Oral Oral  SpO2: 98% 100% 97% 99%  Weight:   (!) 158 kg   Height:       General: Appear in mild distress; no visible Abnormal Neck Mass Or lumps, Conjunctiva normal Cardiovascular: S1 and S2 Present, no Murmur, Respiratory: good respiratory effort, Bilateral Air entry present and faint Crackles, no wheezes Abdomen: Bowel Sound present, Non tender  Extremities: bilateral Pedal edema Neurology: alert and oriented to time, place, and person, myoclonic jerks present. Gait not checked due to patient safety concerns   Data Reviewed: I have Reviewed nursing notes, Vitals, and Lab results since pt's last encounter. Pertinent lab results CBC and BMP I have ordered test including CBC, BMP, LFT I have discussed pt's care plan and test results with Nephrology .   Family Communication: No one at bedside  Disposition: Status is: Inpatient Remains inpatient appropriate because: Need for HD SCDs Start: 12/17/21 0358 Level of care: Telemetry Medical Telemetry reviewed, shows sinus tachycardia and Continue Telemetry due to ARF with high risk for arrhythmia  Author: Berle Mull, MD 12/30/2021 4:12 PM Please look on [email or web address] to find out who is on call.

## 2021-12-30 NOTE — Progress Notes (Signed)
Report given to HD nurse. Transport is on the way.

## 2021-12-30 NOTE — Progress Notes (Signed)
Patient ID: [name], female   DOB: November 27, 1965, 56 y.o.   MRN: [id number]    Progress Note from the Palliative Medicine Team at [facility]   Patient Name: [name]        Date: 12/30/2021 DOB: Mar 26, 1965  Age: 56 y.o. MRN#: [id number] Attending Physician: Lavina Hamman, MD Primary Care Physician: Jacklynn Ganong, MD Admit Date: 12/17/2021   Medical records reviewed   56 y.o. female with a history of CAD s/p CABG, CKD 4, chronic HFpEF, type II DM, OSA.  Presented with right foot ulcer, AKI.  Vascular surgery, podiatry and nephrology following. Currently no plans for surgery.  Cultures are negative. Has progressive AKI on CKD 4 with multiple uremic symptoms.  Initial palliative consult completed on 12/27/2021  I spoke with Dr. Posey Pronto and patient has made decision to move forward with hemodialysis.  PMT will continue to support and help patient navigate treatment option decisions, advanced directive decisions and anticipatory care needs moving forward through this hospitalization.  I introduced myself to Sayana this morning as a provider with the palliative medicine team.   Created space and opportunity for patient to explore thoughts and feelings regarding her current medical situation.  She reluctantly has made decision to move forward with hemodialysis "this is something that I never thought I did have to deal with".  IR to place a HD catheter possibly today.  Education offered on patient autonomy and her right make decisions regarding offered RRR medical interventions, accepting or declining those interventions.  She verbalizes understanding.  Keisy tells me "I know I do not feel good today"  Emotional support offered on her decision to move forward with hemodialysis.  Education offered on the process and logistics of hemodialysis. The hope is that patient will "feel better" once dialysis is initiated.  The hope is that she will have more energy and less mental  fogginess.   Questions and concerns addressed   Discussed with Dr Monika Salk NP  Palliative Medicine Team Team Phone # [phone] Pager [phone]

## 2021-12-31 DIAGNOSIS — D638 Anemia in other chronic diseases classified elsewhere: Secondary | ICD-10-CM

## 2021-12-31 DIAGNOSIS — I5032 Chronic diastolic (congestive) heart failure: Secondary | ICD-10-CM | POA: Diagnosis not present

## 2021-12-31 DIAGNOSIS — L280 Lichen simplex chronicus: Secondary | ICD-10-CM

## 2021-12-31 DIAGNOSIS — E11628 Type 2 diabetes mellitus with other skin complications: Secondary | ICD-10-CM | POA: Diagnosis not present

## 2021-12-31 DIAGNOSIS — N179 Acute kidney failure, unspecified: Secondary | ICD-10-CM | POA: Diagnosis not present

## 2021-12-31 LAB — CBC
HCT: 22.3 % — ABNORMAL LOW (ref 36.0–46.0)
Hemoglobin: 7.3 g/dL — ABNORMAL LOW (ref 12.0–15.0)
MCH: 26.7 pg (ref 26.0–34.0)
MCHC: 32.7 g/dL (ref 30.0–36.0)
MCV: 81.7 fL (ref 80.0–100.0)
Platelets: 133 10*3/uL — ABNORMAL LOW (ref 150–400)
RBC: 2.73 MIL/uL — ABNORMAL LOW (ref 3.87–5.11)
RDW: 16.3 % — ABNORMAL HIGH (ref 11.5–15.5)
WBC: 7.4 10*3/uL (ref 4.0–10.5)
nRBC: 0.5 % — ABNORMAL HIGH (ref 0.0–0.2)

## 2021-12-31 LAB — RENAL FUNCTION PANEL
Albumin: 1.8 g/dL — ABNORMAL LOW (ref 3.5–5.0)
Anion gap: 11 (ref 5–15)
BUN: 111 mg/dL — ABNORMAL HIGH (ref 6–20)
CO2: 24 mmol/L (ref 22–32)
Calcium: 8.4 mg/dL — ABNORMAL LOW (ref 8.9–10.3)
Chloride: 102 mmol/L (ref 98–111)
Creatinine, Ser: 2.93 mg/dL — ABNORMAL HIGH (ref 0.44–1.00)
GFR, Estimated: 18 mL/min — ABNORMAL LOW (ref >60)
Glucose, Bld: 165 mg/dL — ABNORMAL HIGH (ref 70–99)
Phosphorus: 3.2 mg/dL (ref 2.5–4.6)
Potassium: 3.2 mmol/L — ABNORMAL LOW (ref 3.5–5.1)
Sodium: 137 mmol/L (ref 135–145)

## 2021-12-31 LAB — GLUCOSE, CAPILLARY
Glucose-Capillary: 164 mg/dL — ABNORMAL HIGH (ref 70–99)
Glucose-Capillary: 148 mg/dL — ABNORMAL HIGH (ref 70–99)
Glucose-Capillary: 164 mg/dL — ABNORMAL HIGH (ref 70–99)
Glucose-Capillary: 162 mg/dL — ABNORMAL HIGH (ref 70–99)

## 2021-12-31 LAB — MAGNESIUM: Magnesium: 1.9 mg/dL (ref 1.7–2.4)

## 2021-12-31 MED ORDER — HEPARIN SODIUM (PORCINE) 1000 UNIT/ML IJ SOLN
INTRAMUSCULAR | Status: AC
  Filled 2021-12-31: qty 4

## 2021-12-31 MED ORDER — HYDROCERIN EX CREA
TOPICAL_CREAM | Freq: Three times a day (TID) | CUTANEOUS | Status: DC
  Administered 2022-01-02: 1 via TOPICAL
  Filled 2021-12-31 (×3): qty 113

## 2021-12-31 NOTE — Progress Notes (Signed)
Physical Therapy Treatment Patient Details Name: [name] MRN: [id number] DOB: 01-04-1966 Today's Date: 12/31/2021   History of Present Illness Pt is a 56 y/o F who presented to [facility] on 12/11/21 with R foot ulcers with concern of infection, transferred to [facility] on 9/27. Imaging revealed R foot swelling, no clear evidence of osteomyelitis. Workup for Rt foot diabetic infection, AKI on CKD, and fever. 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 Pt needing significantly increased time seated EOB before coming to stand. Pt needing min assist to power up and steady on rise and able to maintain ~2 mins. Pt able to take side steps toward [facility], however pt unable to progress gait away from EOB secondary to fatigue and pain. Pt continues to benefit from skilled PT services to progress toward functional mobility goals.    Recommendations for follow up therapy are one component of a multi-disciplinary discharge planning process, led by the attending physician.  Recommendations may be updated based on patient status, additional functional criteria and insurance authorization.  Follow Up Recommendations  Skilled nursing-short term rehab (<3 hours/day) Can patient physically be transported by private vehicle: No   Assistance Recommended at Discharge Frequent or constant Supervision/Assistance  Patient can return home with the following A lot of help with walking and/or transfers;A lot of help with bathing/dressing/bathroom;Assistance with cooking/housework;Assist for transportation;Help with stairs or ramp for entrance   Equipment Recommendations  None  recommended by PT    Recommendations for Other Services       Precautions / Restrictions Precautions Precautions: Fall     Mobility  Bed Mobility Overal bed mobility: Needs Assistance Bed Mobility: Supine to Sit, Sit to Supine     Supine to sit: Mod assist     General bed mobility comments: mod a to elevate trunk, pt able to self mobilize LEs toward EOB with assist    Transfers Overall transfer level: Needs assistance Equipment used: Rolling walker (2 wheels) Transfers: Sit to/from Stand, Bed to chair/wheelchair/BSC Sit to Stand: Min assist           General transfer comment: min assist to rise and steady with RW, signifigantly increased time needed seated EOb before attempting trasnfer, pt able to take steps laterally toward [facility], declingin further ambulation    Ambulation/Gait               General Gait Details: unable   Stairs             Wheelchair Mobility    Modified Rankin (Stroke Patients Only)       Balance Overall balance assessment: Needs assistance Sitting-balance support: Feet supported, No upper extremity supported Sitting balance-Leahy Scale: Fair     Standing balance support: Bilateral upper extremity supported, During functional activity Standing balance-Leahy Scale: Poor Standing balance comment: reliant on UE support and min A                            Cognition Arousal/Alertness: Awake/alert Behavior During Therapy: Flat affect Overall Cognitive Status: Within Functional Limits for tasks assessed  General Comments: pt appropriate but does not seem to comprehend that her function is not adequate to go home alone        Exercises      General Comments General comments (skin integrity, edema, etc.): pt peeling and shedding a lot skin, c/o itching back, applied cream, RN aware      Pertinent Vitals/Pain Pain Assessment Pain Assessment: Faces Faces Pain  Scale: Hurts even more Pain Location: L knee, shoulders Pain Descriptors / Indicators: Grimacing, Sore Pain Intervention(s): Monitored during session, Limited activity within patient's tolerance    Home Living                          Prior Function            PT Goals (current goals can now be found in the care plan section) Acute Rehab PT Goals Patient Stated Goal: to go home PT Goal Formulation: With patient Time For Goal Achievement: 01/06/22    Frequency    Min 3X/week      PT Plan      Co-evaluation              AM-PAC PT "6 Clicks" Mobility   Outcome Measure  Help needed turning from your back to your side while in a flat bed without using bedrails?: A Lot Help needed moving from lying on your back to sitting on the side of a flat bed without using bedrails?: A Lot Help needed moving to and from a bed to a chair (including a wheelchair)?: A Lot Help needed standing up from a chair using your arms (e.g., wheelchair or bedside chair)?: A Lot Help needed to walk in hospital room?: Total Help needed climbing 3-5 steps with a railing? : Total 6 Click Score: 10    End of Session   Activity Tolerance: Patient limited by fatigue Patient left: in bed;with call bell/phone within reach;with bed alarm set (seated EOB) Nurse Communication: Mobility status PT Visit Diagnosis: Muscle weakness (generalized) (M62.81)     Time: 3710-6269 PT Time Calculation (min) (ACUTE ONLY): 27 min  Charges:  $Therapeutic Activity: 23-37 mins                     Samarie Pinder R. PTA Acute Rehabilitation Services Office: [place] 12/31/2021, 3:19 PM

## 2021-12-31 NOTE — Progress Notes (Signed)
   12/31/21 0114  Vitals  Temp 99.7 F (37.6 C)  BP (!) 117/57  Pulse Rate 90  ECG Heart Rate 91  Resp 20  Post Treatment  Dialyzer Clearance Heavily streaked  Duration of HD Treatment -hour(s) 2 hour(s)  Liters Processed 24  Fluid Removed 1000 mL  Tolerated HD Treatment Yes  Post-Hemodialysis Comments !st [place] went well.   [place] fin. W/o difficulty/. 1 st [place] went well.

## 2021-12-31 NOTE — Progress Notes (Signed)
PROGRESS NOTE    [name]  NID:[id number] DOB: 11/20/1965 DOA: 12/17/2021 PCP: Jacklynn Ganong, MD   Brief Narrative: [name] is a 56 y.o. female with a history of CAD s/p CABG and multiple stents, chronic heart failure, chronic LE wounds, CKD stage IV, diabetes mellitus, OSA. Patient presented from [facility] ED secondary to right foot ulcer with concern for infection. Empiric antibiotics initiated. Podiatry and vascular surgery consulted, recommending local wound care. Hospitalization complicated by development of rash, likely related to antibiotics in addition to worsening AKI leading to initiation of hemodialysis.   Assessment and Plan:  Diabetic foot infection Right plantar surface wound. Patient with purulent drainage from wound. Patient follows with wound care as an outpatient. Recent x-ray and MRI imaging without evidence of osteomyelitis but with evidence of fluid collection concerning for phlegmon vs abscess. Patient is stable, without fevers or leukocytosis however with elevated ESR and CRP of 78 and 15 respectively. Patient started empirically on Vancomycin at outside hospital and transitioned to Ceftriaxone and Flagyl on admission. Recent ABIs with non-compressible  arteries. Complicated by severe CKD. Superficial culture obtained on 9/21 is significant for Citrobacter koseri and is pansensitive. Patient developed fever on 9/29. Repeat MRI (9/30) without evidence of osteomyelitis or abscess. Ceftriaxone transitioned to aztreonam secondary to rash. Vancomycin and Flagyl discontinued for persistent rash. Blood cultures with no growth. Antibiotics discontinued.  AKI on CKD stage IV Baseline creatinine of about 2.8-3. Creatinine of 3.51 on day prior to admission. Torsemide held. BUN/creatinine worsened to a peak of 161/4.89 respectively. TDC placed on 10/10 by interventional radiology and patient underwent hemodialysis on 10/10 with plan for continued HD this week and to then  decide on if long term HD will be needed/agreed to by patient. -Nephrology recommendations: plan for repeat HD on 10/12  Hyponatremia Secondary to AKI and impaired free water handling per nephrology. Now resolved.  Fever Uncertain etiology. Presumed secondary to foot infection. Blood cultures obtained and are no growth. Fever resolved.  Diabetes mellitus type 2 Patient is on long term insulin. Uncontrolled with hyperglycemia. Hemoglobin A1C of 6.9% from 10/2021. Patient is prescribed Lantus 35 units BID and Humalog 5 units TID with meals in addition to a sliding scale as an outpatient. Patient started on Semglee 30 units qHS and SSI during this admission -Continue Semglee 28 units BID and continue SSI  Chronic diastolic heart failure Stable. Torsemide held secondary to AKI  Elevated LFTs AST/ALT of 364/138 respectively and trended down. Total bilirubin of 1.2. RUQ with cholelithiasis without cholecystitis and borderline CBD dilation; no associated symptoms. Resolved.  CAD Patient is currently stable. She is on Effient, Ranexa, aspirin and Imdur as an outpatient.  OSA -Continue CPAP  Chronic anemia Baseline hemoglobin of 7-8. No evidence of acute bleeding. In setting of chronic kidney disease. Stable.  Rash Maculopapular rash on arms and upper legs. Presumed secondary to antibiotics. Rash on right upper thigh appears to be becoming more confluent resembling purpura associated tenderness. Patient treated with Solu-medrol and antibiotics discontinued with resolution of rash.   DVT prophylaxis: SCDs Code Status:   Code Status: Full Code Family Communication: None at bedside Disposition Plan: Discharge pending transition to outpatient antibiotic regimen and nephrology recommendations.   Consultants:  Podiatry Vascular surgery Nephrology  Procedures:  None  Antimicrobials: Ceftriaxone Flagyl Aztreonam Vancomycin   Subjective: Patient reports painful/itching rash.  Somnolence.   Objective: BP (!) 109/44   Pulse 90   Temp 97.8 F (36.6 C) (Oral)   Resp Marland Kitchen)  22   Ht _0  (1.702 m)   Wt (!) 155.1 kg   SpO2 98%   BMI 53.55 kg/m   Examination:  General exam: Appears calm and comfortable Respiratory system: Clear to auscultation. Respiratory effort normal. Cardiovascular system: S1 & S2 heard, Normal rate with regular rhythm. Gastrointestinal system: Abdomen is nondistended, soft and nontender. Normal bowel sounds heard. Central nervous system: Alert and oriented. No focal neurological deficits. Musculoskeletal: No calf tenderness Skin: No cyanosis. No rashes Psychiatry: Judgement and insight appear normal. Mood & affect appropriate.   Data Reviewed: I have personally reviewed following labs and imaging studies  CBC Lab Results  Component Value Date   WBC 7.4 12/31/2021   RBC 2.73 (L) 12/31/2021   HGB 7.3 (L) 12/31/2021   HCT 22.3 (L) 12/31/2021   MCV 81.7 12/31/2021   MCH 26.7 12/31/2021   PLT 133 (L) 12/31/2021   MCHC 32.7 12/31/2021   RDW 16.3 (H) 12/31/2021   LYMPHSABS 2.4 02/24/2010   MONOABS 0.6 02/24/2010   EOSABS 0.1 02/24/2010   BASOSABS 0.0 66/59/9357     Last metabolic panel Lab Results  Component Value Date   NA 137 12/31/2021   K 3.2 (L) 12/31/2021   CL 102 12/31/2021   CO2 24 12/31/2021   BUN 111 (H) 12/31/2021   CREATININE 2.93 (H) 12/31/2021   GLUCOSE 165 (H) 12/31/2021   GFRNONAA 18 (L) 12/31/2021   GFRAA 33 (L) 06/15/2019   CALCIUM 8.4 (L) 12/31/2021   PHOS 3.2 12/31/2021   PROT 6.9 12/30/2021   ALBUMIN 1.8 (L) 12/31/2021   LABGLOB 3.7 10/30/2021   AGRATIO 0.8 10/30/2021   BILITOT 0.9 12/30/2021   ALKPHOS 30 (L) 12/30/2021   AST 27 12/30/2021   ALT 21 12/30/2021   ANIONGAP 11 12/31/2021    GFR: Estimated Creatinine Clearance: 33.1 mL/min (A) (by C-G formula based on SCr of 2.93 mg/dL (H)).  Recent Results (from the past 240 hour(s))  SARS Coronavirus 2 by RT PCR (hospital order, performed in  [facility] hospital lab) *cepheid single result test* Anterior Nasal Swab     Status: None   Collection Time: 12/21/21 12:42 PM   Specimen: Anterior Nasal Swab  Result Value Ref Range Status   SARS Coronavirus 2 by RT PCR NEGATIVE NEGATIVE Final    Comment: (NOTE) SARS-CoV-2 target nucleic acids are NOT DETECTED.  The SARS-CoV-2 RNA is generally detectable in upper and lower respiratory specimens during the acute phase of infection. The lowest concentration of SARS-CoV-2 viral copies this assay can detect is 250 copies / mL. A negative result does not preclude SARS-CoV-2 infection and should not be used as the sole basis for treatment or other patient management decisions.  A negative result may occur with improper specimen collection / handling, submission of specimen other than nasopharyngeal swab, presence of viral mutation(s) within the areas targeted by this assay, and inadequate number of viral copies (<250 copies / mL). A negative result must be combined with clinical observations, patient history, and epidemiological information.  Fact Sheet for Patients:   https://www.patel.info/  Fact Sheet for Healthcare Providers: https://hall.com/  This test is not yet approved or  cleared by the [place] FDA and has been authorized for detection and/or diagnosis of SARS-CoV-2 by FDA under an Emergency Use Authorization (EUA).  This EUA will remain in effect (meaning this test can be used) for the duration of the COVID-19 declaration under Section 564(b)(1) of the Act, 21 U.S.C. section 360bbb-3(b)(1), unless the authorization is  terminated or revoked sooner.  Performed at [facility], [place] [address], [place], [place] [address]       Radiology Studies: IR Fluoro Guide CV Line Right  Result Date: 12/30/2021 INDICATION: 56 year old female referred for tunneled hemodialysis catheter EXAM: TUNNELED CENTRAL VENOUS HEMODIALYSIS  CATHETER PLACEMENT WITH ULTRASOUND AND FLUOROSCOPIC GUIDANCE MEDICATIONS: 1 g vancomycin. The antibiotic was given in an appropriate time interval prior to skin puncture. ANESTHESIA/SEDATION: Moderate (conscious) sedation was not employed during this procedure. A total of Versed 0 mg and Fentanyl 25 mcg was administered intravenously by the radiology nurse. Total intra-service moderate Sedation Time: 0 minutes. The patient's level of consciousness and vital signs were monitored continuously by radiology nursing throughout the procedure under my direct supervision. FLUOROSCOPY: Radiation Exposure Index (as provided by the fluoroscopic device): 5 mGy Kerma COMPLICATIONS: None PROCEDURE: Informed written consent was obtained from the patient after a discussion of the risks, benefits, and alternatives to treatment. Questions regarding the procedure were encouraged and answered. The right neck and chest were prepped with chlorhexidine in a sterile fashion, and a sterile drape was applied covering the operative field. Maximum barrier sterile technique with sterile gowns and gloves were used for the procedure. A timeout was performed prior to the initiation of the procedure. Ultrasound survey was performed. The right internal jugular vein was confirmed to be patent, with images stored and sent to PACS. Micropuncture kit was utilized to access the right internal jugular vein under direct, real-time ultrasound guidance after the overlying soft tissues were anesthetized with 1% lidocaine with epinephrine. Stab incision was made with 11 blade scalpel. Microwire was passed centrally. The microwire was then marked to measure appropriate internal catheter length. External tunneled length was estimated. A total tip to cuff length of 19 cm was selected. 035 guidewire was advanced to the level of the IVC. Skin and subcutaneous tissues of chest wall below the clavicle were generously infiltrated with 1% lidocaine for local  anesthesia. A small stab incision was made with 11 blade scalpel. The selected hemodialysis catheter was tunneled in a retrograde fashion from the anterior chest wall to the venotomy incision. Serial dilation was performed and then a peel-away sheath was placed. The catheter was then placed through the peel-away sheath with tips ultimately positioned within the superior aspect of the right atrium. Final catheter positioning was confirmed and documented with a spot radiographic image. The catheter aspirates and flushes normally. The catheter was flushed with appropriate volume heparin dwells. The catheter exit site was secured with a 0-Prolene retention suture. Gel-Foam slurry was infused into the soft tissue tract. The venotomy incision was closed Derma bond and sterile dressing. Dressings were applied at the chest wall. Patient tolerated the procedure well and remained hemodynamically stable throughout. No complications were encountered and no significant blood loss encountered. IMPRESSION: Status post placement of right IJ tunneled hemodialysis catheter Signed, Dulcy Fanny. Nadene Rubins, RPVI [facility] Electronically Signed   By: Corrie Mckusick D.O.   On: 12/30/2021 17:28   IR US Guide Vasc Access Right  Result Date: 12/30/2021 INDICATION: 56 year old female referred for tunneled hemodialysis catheter EXAM: TUNNELED CENTRAL VENOUS HEMODIALYSIS CATHETER PLACEMENT WITH ULTRASOUND AND FLUOROSCOPIC GUIDANCE MEDICATIONS: 1 g vancomycin. The antibiotic was given in an appropriate time interval prior to skin puncture. ANESTHESIA/SEDATION: Moderate (conscious) sedation was not employed during this procedure. A total of Versed 0 mg and Fentanyl 25 mcg was administered intravenously by the radiology nurse. Total intra-service moderate  Sedation Time: 0 minutes. The patient's level of consciousness and vital signs were monitored continuously by radiology nursing  throughout the procedure under my direct supervision. FLUOROSCOPY: Radiation Exposure Index (as provided by the fluoroscopic device): 5 mGy Kerma COMPLICATIONS: None PROCEDURE: Informed written consent was obtained from the patient after a discussion of the risks, benefits, and alternatives to treatment. Questions regarding the procedure were encouraged and answered. The right neck and chest were prepped with chlorhexidine in a sterile fashion, and a sterile drape was applied covering the operative field. Maximum barrier sterile technique with sterile gowns and gloves were used for the procedure. A timeout was performed prior to the initiation of the procedure. Ultrasound survey was performed. The right internal jugular vein was confirmed to be patent, with images stored and sent to PACS. Micropuncture kit was utilized to access the right internal jugular vein under direct, real-time ultrasound guidance after the overlying soft tissues were anesthetized with 1% lidocaine with epinephrine. Stab incision was made with 11 blade scalpel. Microwire was passed centrally. The microwire was then marked to measure appropriate internal catheter length. External tunneled length was estimated. A total tip to cuff length of 19 cm was selected. 035 guidewire was advanced to the level of the IVC. Skin and subcutaneous tissues of chest wall below the clavicle were generously infiltrated with 1% lidocaine for local anesthesia. A small stab incision was made with 11 blade scalpel. The selected hemodialysis catheter was tunneled in a retrograde fashion from the anterior chest wall to the venotomy incision. Serial dilation was performed and then a peel-away sheath was placed. The catheter was then placed through the peel-away sheath with tips ultimately positioned within the superior aspect of the right atrium. Final catheter positioning was confirmed and documented with a spot radiographic image. The catheter aspirates and flushes  normally. The catheter was flushed with appropriate volume heparin dwells. The catheter exit site was secured with a 0-Prolene retention suture. Gel-Foam slurry was infused into the soft tissue tract. The venotomy incision was closed Derma bond and sterile dressing. Dressings were applied at the chest wall. Patient tolerated the procedure well and remained hemodynamically stable throughout. No complications were encountered and no significant blood loss encountered. IMPRESSION: Status post placement of right IJ tunneled hemodialysis catheter Signed, Dulcy Fanny. Nadene Rubins, RPVI [facility] Electronically Signed   By: Corrie Mckusick D.O.   On: 12/30/2021 17:28      LOS: 14 days    Cordelia Poche, MD [facility] 12/31/2021, 11:07 AM   If 7PM-7AM, please contact night-coverage [email or web address]

## 2021-12-31 NOTE — Progress Notes (Signed)
Patient ID: [name], female   DOB: 03/22/1966, 56 y.o.   MRN: [id number] S:Pt underwent HD early this morning (delayed due to HD cath placement and census).  No new complaints and tolerated it well. O:BP (!) 109/44   Pulse 90   Temp 97.8 F (36.6 C) (Oral)   Resp (!) 22   Ht 5' 7"  (1.702 m)   Wt (!) 155.1 kg   SpO2 98%   BMI 53.55 kg/m   Intake/Output Summary (Last 24 hours) at 12/31/2021 1056 Last data filed at 12/31/2021 0200 Gross per 24 hour  Intake 480 ml  Output 2350 ml  Net -1870 ml   Intake/Output: I/O last 3 completed shifts: In: 840 [P.O.:840] Out: 3400 [Urine:2400; Other:1000]  Intake/Output this shift:  No intake/output data recorded. Weight change: -2.1 kg VOH:YWVPXT mentation, + myoclonic jerking CVS: RRR no rub Resp: CTA Abd: +BS, soft, NT/ND Ext: brawny edema bilaterally  Neuro: + asterixis  Recent Labs  Lab 12/26/21 0034 12/27/21 0050 12/28/21 0044 12/29/21 0258 12/30/21 0148 12/30/21 1829 12/31/21 0429  NA 132* 130* 130* 132* 131* 138 137  K 3.7 3.9 3.6 3.8 3.7 3.8 3.2*  CL 98 97* 97* 101 98 105 102  CO2 21* 19* 20* 22 20* 22 24  GLUCOSE 218* 178* 242* 244* 248* 180* 165*  BUN 141* 148* 155* 161* 155* 150* 111*  CREATININE 4.43* 4.64* 4.89* 4.50* 4.16* 3.72* 2.93*  ALBUMIN 2.2* 2.2* 2.0* 1.9* 2.0* 2.0*  2.0* 1.8*  CALCIUM 9.0 8.8* 8.6* 8.6* 8.4* 8.8* 8.4*  PHOS 5.6* 5.3* 5.1* 5.4* 4.8* 4.5 3.2  AST  --   --   --   --   --  27  --   ALT  --   --   --   --   --  21  --    Liver Function Tests: Recent Labs  Lab 12/30/21 0148 12/30/21 1829 12/31/21 0429  AST  --  27  --   ALT  --  21  --   ALKPHOS  --  30*  --   BILITOT  --  0.9  --   PROT  --  6.9  --   ALBUMIN 2.0* 2.0*  2.0* 1.8*   No results for input(s): "LIPASE", "AMYLASE" in the last 168 hours. No results for input(s): "AMMONIA" in the last 168 hours. CBC: Recent Labs  Lab 12/28/21 1055 12/29/21 0258 12/30/21 0148 12/30/21 1829 12/31/21 0429  WBC 11.5* 10.3 9.3  7.4 7.4  HGB 7.5* 7.4* 7.5* 7.6* 7.3*  HCT 22.3* 22.0* 24.1* 23.0* 22.3*  MCV 81.7 82.7 84.9 82.4 81.7  PLT 146* 143* 147* 135* 133*   Cardiac Enzymes: No results for input(s): "CKTOTAL", "CKMB", "CKMBINDEX", "TROPONINI" in the last 168 hours. CBG: Recent Labs  Lab 12/30/21 0623 12/30/21 1112 12/30/21 1731 12/30/21 2115 12/31/21 0608  GLUCAP 229* 210* 184* 221* 164*    Iron Studies: No results for input(s): "IRON", "TIBC", "TRANSFERRIN", "FERRITIN" in the last 72 hours. Studies/Results: IR Fluoro Guide CV Line Right  Result Date: 12/30/2021 INDICATION: 56 year old female referred for tunneled hemodialysis catheter EXAM: TUNNELED CENTRAL VENOUS HEMODIALYSIS CATHETER PLACEMENT WITH ULTRASOUND AND FLUOROSCOPIC GUIDANCE MEDICATIONS: 1 g vancomycin. The antibiotic was given in an appropriate time interval prior to skin puncture. ANESTHESIA/SEDATION: Moderate (conscious) sedation was not employed during this procedure. A total of Versed 0 mg and Fentanyl 25 mcg was administered intravenously by the radiology nurse. Total intra-service moderate Sedation Time: 0 minutes. The patient's level of consciousness  and vital signs were monitored continuously by radiology nursing throughout the procedure under my direct supervision. FLUOROSCOPY: Radiation Exposure Index (as provided by the fluoroscopic device): 5 mGy Kerma COMPLICATIONS: None PROCEDURE: Informed written consent was obtained from the patient after a discussion of the risks, benefits, and alternatives to treatment. Questions regarding the procedure were encouraged and answered. The right neck and chest were prepped with chlorhexidine in a sterile fashion, and a sterile drape was applied covering the operative field. Maximum barrier sterile technique with sterile gowns and gloves were used for the procedure. A timeout was performed prior to the initiation of the procedure. Ultrasound survey was performed. The right internal jugular vein was  confirmed to be patent, with images stored and sent to PACS. Micropuncture kit was utilized to access the right internal jugular vein under direct, real-time ultrasound guidance after the overlying soft tissues were anesthetized with 1% lidocaine with epinephrine. Stab incision was made with 11 blade scalpel. Microwire was passed centrally. The microwire was then marked to measure appropriate internal catheter length. External tunneled length was estimated. A total tip to cuff length of 19 cm was selected. 035 guidewire was advanced to the level of the IVC. Skin and subcutaneous tissues of chest wall below the clavicle were generously infiltrated with 1% lidocaine for local anesthesia. A small stab incision was made with 11 blade scalpel. The selected hemodialysis catheter was tunneled in a retrograde fashion from the anterior chest wall to the venotomy incision. Serial dilation was performed and then a peel-away sheath was placed. The catheter was then placed through the peel-away sheath with tips ultimately positioned within the superior aspect of the right atrium. Final catheter positioning was confirmed and documented with a spot radiographic image. The catheter aspirates and flushes normally. The catheter was flushed with appropriate volume heparin dwells. The catheter exit site was secured with a 0-Prolene retention suture. Gel-Foam slurry was infused into the soft tissue tract. The venotomy incision was closed Derma bond and sterile dressing. Dressings were applied at the chest wall. Patient tolerated the procedure well and remained hemodynamically stable throughout. No complications were encountered and no significant blood loss encountered. IMPRESSION: Status post placement of right IJ tunneled hemodialysis catheter Signed, Dulcy Fanny. Nadene Rubins, RPVI [facility] Electronically Signed   By: Corrie Mckusick D.O.   On: 12/30/2021 17:28   IR US  Guide Vasc Access Right  Result Date: 12/30/2021 INDICATION: 56 year old female referred for tunneled hemodialysis catheter EXAM: TUNNELED CENTRAL VENOUS HEMODIALYSIS CATHETER PLACEMENT WITH ULTRASOUND AND FLUOROSCOPIC GUIDANCE MEDICATIONS: 1 g vancomycin. The antibiotic was given in an appropriate time interval prior to skin puncture. ANESTHESIA/SEDATION: Moderate (conscious) sedation was not employed during this procedure. A total of Versed 0 mg and Fentanyl 25 mcg was administered intravenously by the radiology nurse. Total intra-service moderate Sedation Time: 0 minutes. The patient's level of consciousness and vital signs were monitored continuously by radiology nursing throughout the procedure under my direct supervision. FLUOROSCOPY: Radiation Exposure Index (as provided by the fluoroscopic device): 5 mGy Kerma COMPLICATIONS: None PROCEDURE: Informed written consent was obtained from the patient after a discussion of the risks, benefits, and alternatives to treatment. Questions regarding the procedure were encouraged and answered. The right neck and chest were prepped with chlorhexidine in a sterile fashion, and a sterile drape was applied covering the operative field. Maximum barrier sterile technique with sterile gowns and gloves were used for the procedure. A timeout was performed prior to the  initiation of the procedure. Ultrasound survey was performed. The right internal jugular vein was confirmed to be patent, with images stored and sent to PACS. Micropuncture kit was utilized to access the right internal jugular vein under direct, real-time ultrasound guidance after the overlying soft tissues were anesthetized with 1% lidocaine with epinephrine. Stab incision was made with 11 blade scalpel. Microwire was passed centrally. The microwire was then marked to measure appropriate internal catheter length. External tunneled length was estimated. A total tip to cuff length of 19 cm was selected. 035  guidewire was advanced to the level of the IVC. Skin and subcutaneous tissues of chest wall below the clavicle were generously infiltrated with 1% lidocaine for local anesthesia. A small stab incision was made with 11 blade scalpel. The selected hemodialysis catheter was tunneled in a retrograde fashion from the anterior chest wall to the venotomy incision. Serial dilation was performed and then a peel-away sheath was placed. The catheter was then placed through the peel-away sheath with tips ultimately positioned within the superior aspect of the right atrium. Final catheter positioning was confirmed and documented with a spot radiographic image. The catheter aspirates and flushes normally. The catheter was flushed with appropriate volume heparin dwells. The catheter exit site was secured with a 0-Prolene retention suture. Gel-Foam slurry was infused into the soft tissue tract. The venotomy incision was closed Derma bond and sterile dressing. Dressings were applied at the chest wall. Patient tolerated the procedure well and remained hemodynamically stable throughout. No complications were encountered and no significant blood loss encountered. IMPRESSION: Status post placement of right IJ tunneled hemodialysis catheter Signed, Dulcy Fanny. Nadene Rubins, RPVI [facility] Radiology Electronically Signed   By: Corrie Mckusick D.O.   On: 12/30/2021 17:28    (feeding supplement) PROSource Plus  30 mL Oral TID BM   carvedilol  12.5 mg Oral BID WC   Chlorhexidine Gluconate Cloth  6 each Topical Q0600   feeding supplement  237 mL Oral BID BM   gabapentin  100 mg Oral QHS   insulin aspart  0-15 Units Subcutaneous TID WC   insulin aspart  0-5 Units Subcutaneous QHS   insulin glargine-yfgn  28 Units Subcutaneous BID   isosorbide mononitrate  60 mg Oral q morning   rosuvastatin  5 mg Oral Daily   torsemide  40 mg Oral BID   triamcinolone cream   Topical BID    BMET     Component Value Date/Time   NA 137 12/31/2021 0429   K 3.2 (L) 12/31/2021 0429   CL 102 12/31/2021 0429   CO2 24 12/31/2021 0429   GLUCOSE 165 (H) 12/31/2021 0429   BUN 111 (H) 12/31/2021 0429   CREATININE 2.93 (H) 12/31/2021 0429   CALCIUM 8.4 (L) 12/31/2021 0429   GFRNONAA 18 (L) 12/31/2021 0429   GFRAA 33 (L) 06/15/2019 1237   CBC    Component Value Date/Time   WBC 7.4 12/31/2021 0429   RBC 2.73 (L) 12/31/2021 0429   HGB 7.3 (L) 12/31/2021 0429   HCT 22.3 (L) 12/31/2021 0429   PLT 133 (L) 12/31/2021 0429   MCV 81.7 12/31/2021 0429   MCH 26.7 12/31/2021 0429   MCHC 32.7 12/31/2021 0429   RDW 16.3 (H) 12/31/2021 0429   LYMPHSABS 2.4 02/24/2010 1320   MONOABS 0.6 02/24/2010 1320   EOSABS 0.1 02/24/2010 1320   BASOSABS 0.0 02/24/2010 1320    Assessment/Plan:   AKI/CKD stage IV - has had multiple  episodes of AKI/CKD with progressively worsening renal function over the past 3 months.  Now with overt uremia and asterixis.  She is willing to proceed with HD on short term trial of 1 week and then decide whether she would want to continue or stop HD.  I feel she will better be able to make decisions, once her uremia is treated.  Have consulted IR and TDC placed 12/30/21.  She unfortunately did not have her first HD session until early this morning.  Will plan for HD again tomorrow.  Extremely slow start HD due to increased risk for dialysis dysequilibrium (due to severe azotemia).  Plan for daily dialysis for the next 3 days and then re-evaluate mentation and decision to continue or withdrawal from dialysis.   Diabetic foot ulcer/infection of right leg - evaluated by [facility] but arteriogram on hold due to worsening renal function. HTN - stable, hold meds prior to HD. Chronic diastolic CHF - stable Hyponatremia - due to AKI and impaired free water handling.  Continue to follow Anemia of CKD stage IV - low iron.  Will start IV iron with HD and likely add ESA.  Donetta Potts,  MD [facility]

## 2021-12-31 NOTE — TOC Progression Note (Signed)
Transition of Care [facility]) - Progression Note    Patient Details  Name: [name] MRN: [id number] Date of Birth: 29-Mar-1965  Transition of Care [facility]) CM/SW [place], [place] Phone Number: 12/31/2021, 2:48 PM  Clinical Narrative:      CSW met with pt to discuss SNF rec. Pt states she lives at home alone in [place]. She is not detailed when provided history and support available at home. She does state she has a Corporate investment banker and a walker at home and that her son lives near by. Pt states she does not want to go to SNF for STR. She states she wants to return home alone. She does mention somewhere called [facility] but she is unable to provided details of what [facility] is. She thinks it is in [place] or [place] and stated that someone told her to follow up there. CSW is unable to identify any kind of healthcare agency called [facility] in that area. CSW attempts to discuss mobility concerns and support at home; pt reiterated she would like to return home instead of snf and states she has family in the area to help. She consents to [place] contacting her son Levi Aland.  CSW attempted to call son but no answer and voicemailbox not available.   Expected Discharge Plan: [place] Barriers to Discharge: Continued Medical Work up  Expected Discharge Plan and Services Expected Discharge Plan: [place] arrangements for the past 2 months: Single Family Home                                       Social Determinants of Health (SDOH) Interventions    Readmission Risk Interventions    12/18/2021    2:02 PM 11/05/2021    1:58 PM  Readmission Risk Prevention Plan  Transportation Screening Complete Complete  PCP or Specialist Appt within 5-7 Days  Complete  PCP or Specialist Appt within 3-5 Days Complete   Home Care Screening  Complete  Medication Review (RN CM)  Complete  HRI or Home Care Consult Complete   Social Work Consult  for [place] Planning/Counseling Complete   Palliative Care Screening Not Applicable   Medication Review Press photographer) Complete

## 2021-12-31 NOTE — Progress Notes (Signed)
Pt refused CPAP for tonight. Says she does not like the face mask and is comfortable right now. Will call if she changes her mind.

## 2021-12-31 NOTE — Progress Notes (Signed)
Pt is back in her room from dialysis.  RT made visit to her room to place her on CPAP.  Patient states she wears nasal prongs at home for night rest and didn't want to try our mask at this time.  Advised patient to have a family member bring her prongs along with her tubing tomorrow and we could attach it to our device.  RN aware.

## 2022-01-01 DIAGNOSIS — D638 Anemia in other chronic diseases classified elsewhere: Secondary | ICD-10-CM | POA: Diagnosis not present

## 2022-01-01 DIAGNOSIS — E11628 Type 2 diabetes mellitus with other skin complications: Secondary | ICD-10-CM | POA: Diagnosis not present

## 2022-01-01 DIAGNOSIS — Z515 Encounter for palliative care: Secondary | ICD-10-CM | POA: Diagnosis not present

## 2022-01-01 DIAGNOSIS — I5032 Chronic diastolic (congestive) heart failure: Secondary | ICD-10-CM | POA: Diagnosis not present

## 2022-01-01 DIAGNOSIS — I2510 Atherosclerotic heart disease of native coronary artery without angina pectoris: Secondary | ICD-10-CM | POA: Diagnosis not present

## 2022-01-01 DIAGNOSIS — N179 Acute kidney failure, unspecified: Secondary | ICD-10-CM | POA: Diagnosis not present

## 2022-01-01 LAB — TYPE AND SCREEN
ABO/RH(D): A POS
Antibody Screen: POSITIVE
DAT, IgG: NEGATIVE
Unit division: 0
Unit division: 0

## 2022-01-01 LAB — GLUCOSE, CAPILLARY
Glucose-Capillary: 177 mg/dL — ABNORMAL HIGH (ref 70–99)
Glucose-Capillary: 172 mg/dL — ABNORMAL HIGH (ref 70–99)
Glucose-Capillary: 123 mg/dL — ABNORMAL HIGH (ref 70–99)

## 2022-01-01 LAB — RENAL FUNCTION PANEL
Albumin: 1.8 g/dL — ABNORMAL LOW (ref 3.5–5.0)
Anion gap: 8 (ref 5–15)
BUN: 109 mg/dL — ABNORMAL HIGH (ref 6–20)
CO2: 25 mmol/L (ref 22–32)
Calcium: 8.6 mg/dL — ABNORMAL LOW (ref 8.9–10.3)
Chloride: 105 mmol/L (ref 98–111)
Creatinine, Ser: 2.88 mg/dL — ABNORMAL HIGH (ref 0.44–1.00)
GFR, Estimated: 18 mL/min — ABNORMAL LOW (ref >60)
Glucose, Bld: 198 mg/dL — ABNORMAL HIGH (ref 70–99)
Phosphorus: 3.2 mg/dL (ref 2.5–4.6)
Potassium: 3.5 mmol/L (ref 3.5–5.1)
Sodium: 138 mmol/L (ref 135–145)

## 2022-01-01 LAB — BPAM RBC
Blood Product Expiration Date: 202311072359
Blood Product Expiration Date: 202311142359
Unit Type and Rh: 600
Unit Type and Rh: 600

## 2022-01-01 LAB — CBC
HCT: 22.8 % — ABNORMAL LOW (ref 36.0–46.0)
Hemoglobin: 7.4 g/dL — ABNORMAL LOW (ref 12.0–15.0)
MCH: 27.1 pg (ref 26.0–34.0)
MCHC: 32.5 g/dL (ref 30.0–36.0)
MCV: 83.5 fL (ref 80.0–100.0)
Platelets: 124 10*3/uL — ABNORMAL LOW (ref 150–400)
RBC: 2.73 MIL/uL — ABNORMAL LOW (ref 3.87–5.11)
RDW: 16.1 % — ABNORMAL HIGH (ref 11.5–15.5)
WBC: 7.2 10*3/uL (ref 4.0–10.5)
nRBC: 0.3 % — ABNORMAL HIGH (ref 0.0–0.2)

## 2022-01-01 MED ORDER — HEPARIN SODIUM (PORCINE) 1000 UNIT/ML IJ SOLN
INTRAMUSCULAR | Status: AC
  Filled 2022-01-01: qty 4

## 2022-01-01 NOTE — Progress Notes (Signed)
Patient refusing CPAP at this time

## 2022-01-01 NOTE — Progress Notes (Signed)
   01/01/22 1156  Mobility  Activity Refused mobility   Mobility Specialist Progress Note  Pt refused mobility d/t not feeling like it. Will f/u as able.    Lucious Groves Mobility Specialist

## 2022-01-01 NOTE — Progress Notes (Signed)
Patient ID: [name], female   DOB: [birth year], 56 y.o.   MRN: [id number]    Progress Note from the Palliative Medicine Team at [facility]   Patient Name: [name]        Date: 01/01/2022 DOB: [birth year]  Age: 56 y.o. MRN#: [id number] Attending Physician: Mariel Aloe, MD Primary Care Physician: Jacklynn Ganong, MD Admit Date: 12/17/2021   Medical records reviewed, assessed patient   56 y.o. female with a history of CAD s/p CABG, CKD 4, chronic HFpEF, type II DM, OSA.  Presented with right foot ulcer, AKI.  Vascular surgery, podiatry and nephrology following. Currently no plans for surgery.  Cultures are negative. Has progressive AKI on CKD 4 with multiple uremic symptoms, progressed to HD  Initial palliative consult completed on 12/27/2021   PMT will continue to support and help patient navigate treatment option decisions, advanced directive decisions and anticipatory care needs moving forward through this hospitalization.  I sat at the bedside with [name] this morning she has successfully  completed and HD treatment.   Created space and opportunity for patient to explore thoughts and feelings regarding her current medical situation.  She continues to process her decision to move forward with hemodialysis.  She recognizes that her cognitive state "is clearer" today.  [name] opened up a bit more with me today .  We were able to talk about the seriousness of her multiple co-morbilities; CHF, ESRD, decreased mobility and wounds.    I expressed my concern around anticipatory care needs, she too acknowledges.      Offered and encouraged a family meeting, declined at this time.   Offered assistance with HPOA and AD completion declined at this time.  Patient will need ongoing education and assistance as she attempts to put a discharge plan into place.  I believe that she will consider SNF for short term rehab if eligible.  Ultimately she home to maintain her  independence.  Education on the importance of self care and responsibility in treatment plan. Demonstration on simple AROM exercises  Emotional support offered, this is a very difficult time for [name].  Questions and concerns addressed   Discussed with Dr Posey Pronto   This nurse practitioner informed  the patient and the attending that I will be out of the hospital until Sunday morning.  If the patient is still hospitalized I will follow-up at that time.  Call palliative medicine team phone # [phone] with questions or concerns.    Wadie Lessen NP  Palliative Medicine Team Team Phone # [phone] Pager [phone]

## 2022-01-01 NOTE — Progress Notes (Signed)
Patient ID: Renley Gutman, female   DOB: December 16, 1965, 56 y.o.   MRN: [id number] S: No complaints this am O:BP (!) 115/43   Pulse 80   Temp 98.5 F (36.9 C) (Oral)   Resp 20   Ht 5' 7"  (1.702 m)   Wt (!) 153.5 kg   SpO2 98%   BMI 53.00 kg/m   Intake/Output Summary (Last 24 hours) at 01/01/2022 1008 Last data filed at 01/01/2022 0855 Gross per 24 hour  Intake 837 ml  Output 1750 ml  Net -913 ml   Intake/Output: I/O last 3 completed shifts: In: 1080 [P.O.:1080] Out: 4000 [Urine:3000; Other:1000]  Intake/Output this shift:  Total I/O In: 237 [P.O.:237] Out: -  Weight change: -2.4 kg Gen:NAD CVS: RRR Resp: CTA Abd: +BS, soft, NT/ND Ext:1+ pretibial edema  Recent Labs  Lab 12/27/21 0050 12/28/21 0044 12/29/21 0258 12/30/21 0148 12/30/21 1829 12/31/21 0429 01/01/22 0032  NA 130* 130* 132* 131* 138 137 138  K 3.9 3.6 3.8 3.7 3.8 3.2* 3.5  CL 97* 97* 101 98 105 102 105  CO2 19* 20* 22 20* 22 24 25   GLUCOSE 178* 242* 244* 248* 180* 165* 198*  BUN 148* 155* 161* 155* 150* 111* 109*  CREATININE 4.64* 4.89* 4.50* 4.16* 3.72* 2.93* 2.88*  ALBUMIN 2.2* 2.0* 1.9* 2.0* 2.0*  2.0* 1.8* 1.8*  CALCIUM 8.8* 8.6* 8.6* 8.4* 8.8* 8.4* 8.6*  PHOS 5.3* 5.1* 5.4* 4.8* 4.5 3.2 3.2  AST  --   --   --   --  27  --   --   ALT  --   --   --   --  21  --   --    Liver Function Tests: Recent Labs  Lab 12/30/21 1829 12/31/21 0429 01/01/22 0032  AST 27  --   --   ALT 21  --   --   ALKPHOS 30*  --   --   BILITOT 0.9  --   --   PROT 6.9  --   --   ALBUMIN 2.0*  2.0* 1.8* 1.8*   No results for input(s): "LIPASE", "AMYLASE" in the last 168 hours. No results for input(s): "AMMONIA" in the last 168 hours. CBC: Recent Labs  Lab 12/29/21 0258 12/30/21 0148 12/30/21 1829 12/31/21 0429 01/01/22 0032  WBC 10.3 9.3 7.4 7.4 7.2  HGB 7.4* 7.5* 7.6* 7.3* 7.4*  HCT 22.0* 24.1* 23.0* 22.3* 22.8*  MCV 82.7 84.9 82.4 81.7 83.5  PLT 143* 147* 135* 133* 124*   Cardiac Enzymes: No results  for input(s): "CKTOTAL", "CKMB", "CKMBINDEX", "TROPONINI" in the last 168 hours. CBG: Recent Labs  Lab 12/31/21 0608 12/31/21 1122 12/31/21 1639 12/31/21 2056 01/01/22 0559  GLUCAP 164* 148* 164* 162* 177*    Iron Studies: No results for input(s): "IRON", "TIBC", "TRANSFERRIN", "FERRITIN" in the last 72 hours. Studies/Results: IR Fluoro Guide CV Line Right  Result Date: 12/30/2021 INDICATION: 56 year old female referred for tunneled hemodialysis catheter EXAM: TUNNELED CENTRAL VENOUS HEMODIALYSIS CATHETER PLACEMENT WITH ULTRASOUND AND FLUOROSCOPIC GUIDANCE MEDICATIONS: 1 g vancomycin. The antibiotic was given in an appropriate time interval prior to skin puncture. ANESTHESIA/SEDATION: Moderate (conscious) sedation was not employed during this procedure. A total of Versed 0 mg and Fentanyl 25 mcg was administered intravenously by the radiology nurse. Total intra-service moderate Sedation Time: 0 minutes. The patient's level of consciousness and vital signs were monitored continuously by radiology nursing throughout the procedure under my direct supervision. FLUOROSCOPY: Radiation Exposure Index (as provided by the fluoroscopic  device): 5 mGy Kerma COMPLICATIONS: None PROCEDURE: Informed written consent was obtained from the patient after a discussion of the risks, benefits, and alternatives to treatment. Questions regarding the procedure were encouraged and answered. The right neck and chest were prepped with chlorhexidine in a sterile fashion, and a sterile drape was applied covering the operative field. Maximum barrier sterile technique with sterile gowns and gloves were used for the procedure. A timeout was performed prior to the initiation of the procedure. Ultrasound survey was performed. The right internal jugular vein was confirmed to be patent, with images stored and sent to PACS. Micropuncture kit was utilized to access the right internal jugular vein under direct, real-time ultrasound  guidance after the overlying soft tissues were anesthetized with 1% lidocaine with epinephrine. Stab incision was made with 11 blade scalpel. Microwire was passed centrally. The microwire was then marked to measure appropriate internal catheter length. External tunneled length was estimated. A total tip to cuff length of 19 cm was selected. 035 guidewire was advanced to the level of the IVC. Skin and subcutaneous tissues of chest wall below the clavicle were generously infiltrated with 1% lidocaine for local anesthesia. A small stab incision was made with 11 blade scalpel. The selected hemodialysis catheter was tunneled in a retrograde fashion from the anterior chest wall to the venotomy incision. Serial dilation was performed and then a peel-away sheath was placed. The catheter was then placed through the peel-away sheath with tips ultimately positioned within the superior aspect of the right atrium. Final catheter positioning was confirmed and documented with a spot radiographic image. The catheter aspirates and flushes normally. The catheter was flushed with appropriate volume heparin dwells. The catheter exit site was secured with a 0-Prolene retention suture. Gel-Foam slurry was infused into the soft tissue tract. The venotomy incision was closed Derma bond and sterile dressing. Dressings were applied at the chest wall. Patient tolerated the procedure well and remained hemodynamically stable throughout. No complications were encountered and no significant blood loss encountered. IMPRESSION: Status post placement of right IJ tunneled hemodialysis catheter Signed, Dulcy Fanny. Nadene Rubins, RPVI [facility] Radiology Electronically Signed   By: Corrie Mckusick D.O.   On: 12/30/2021 17:28   IR US Guide Vasc Access Right  Result Date: 12/30/2021 INDICATION: 56 year old female referred for tunneled hemodialysis catheter EXAM: TUNNELED CENTRAL VENOUS HEMODIALYSIS  CATHETER PLACEMENT WITH ULTRASOUND AND FLUOROSCOPIC GUIDANCE MEDICATIONS: 1 g vancomycin. The antibiotic was given in an appropriate time interval prior to skin puncture. ANESTHESIA/SEDATION: Moderate (conscious) sedation was not employed during this procedure. A total of Versed 0 mg and Fentanyl 25 mcg was administered intravenously by the radiology nurse. Total intra-service moderate Sedation Time: 0 minutes. The patient's level of consciousness and vital signs were monitored continuously by radiology nursing throughout the procedure under my direct supervision. FLUOROSCOPY: Radiation Exposure Index (as provided by the fluoroscopic device): 5 mGy Kerma COMPLICATIONS: None PROCEDURE: Informed written consent was obtained from the patient after a discussion of the risks, benefits, and alternatives to treatment. Questions regarding the procedure were encouraged and answered. The right neck and chest were prepped with chlorhexidine in a sterile fashion, and a sterile drape was applied covering the operative field. Maximum barrier sterile technique with sterile gowns and gloves were used for the procedure. A timeout was performed prior to the initiation of the procedure. Ultrasound survey was performed. The right internal jugular vein was confirmed to be patent, with images stored and sent to PACS.  Micropuncture kit was utilized to access the right internal jugular vein under direct, real-time ultrasound guidance after the overlying soft tissues were anesthetized with 1% lidocaine with epinephrine. Stab incision was made with 11 blade scalpel. Microwire was passed centrally. The microwire was then marked to measure appropriate internal catheter length. External tunneled length was estimated. A total tip to cuff length of 19 cm was selected. 035 guidewire was advanced to the level of the IVC. Skin and subcutaneous tissues of chest wall below the clavicle were generously infiltrated with 1% lidocaine for local  anesthesia. A small stab incision was made with 11 blade scalpel. The selected hemodialysis catheter was tunneled in a retrograde fashion from the anterior chest wall to the venotomy incision. Serial dilation was performed and then a peel-away sheath was placed. The catheter was then placed through the peel-away sheath with tips ultimately positioned within the superior aspect of the right atrium. Final catheter positioning was confirmed and documented with a spot radiographic image. The catheter aspirates and flushes normally. The catheter was flushed with appropriate volume heparin dwells. The catheter exit site was secured with a 0-Prolene retention suture. Gel-Foam slurry was infused into the soft tissue tract. The venotomy incision was closed Derma bond and sterile dressing. Dressings were applied at the chest wall. Patient tolerated the procedure well and remained hemodynamically stable throughout. No complications were encountered and no significant blood loss encountered. IMPRESSION: Status post placement of right IJ tunneled hemodialysis catheter Signed, Dulcy Fanny. Nadene Rubins, RPVI [facility] Radiology Electronically Signed   By: Corrie Mckusick D.O.   On: 12/30/2021 17:28    (feeding supplement) PROSource Plus  30 mL Oral TID BM   carvedilol  12.5 mg Oral BID WC   Chlorhexidine Gluconate Cloth  6 each Topical Q0600   feeding supplement  237 mL Oral BID BM   gabapentin  100 mg Oral QHS   hydrocerin   Topical TID   insulin aspart  0-15 Units Subcutaneous TID WC   insulin aspart  0-5 Units Subcutaneous QHS   insulin glargine-yfgn  28 Units Subcutaneous BID   isosorbide mononitrate  60 mg Oral q morning   rosuvastatin  5 mg Oral Daily   torsemide  40 mg Oral BID   triamcinolone cream   Topical BID    BMET    Component Value Date/Time   NA 138 01/01/2022 0032   K 3.5 01/01/2022 0032   CL 105 01/01/2022 0032   CO2 25 01/01/2022 0032    GLUCOSE 198 (H) 01/01/2022 0032   BUN 109 (H) 01/01/2022 0032   CREATININE 2.88 (H) 01/01/2022 0032   CALCIUM 8.6 (L) 01/01/2022 0032   GFRNONAA 18 (L) 01/01/2022 0032   GFRAA 33 (L) 06/15/2019 1237   CBC    Component Value Date/Time   WBC 7.2 01/01/2022 0032   RBC 2.73 (L) 01/01/2022 0032   HGB 7.4 (L) 01/01/2022 0032   HCT 22.8 (L) 01/01/2022 0032   PLT 124 (L) 01/01/2022 0032   MCV 83.5 01/01/2022 0032   MCH 27.1 01/01/2022 0032   MCHC 32.5 01/01/2022 0032   RDW 16.1 (H) 01/01/2022 0032   LYMPHSABS 2.4 02/24/2010 1320   MONOABS 0.6 02/24/2010 1320   EOSABS 0.1 02/24/2010 1320   BASOSABS 0.0 02/24/2010 1320      Assessment/Plan:   AKI/CKD stage IV - has had multiple episodes of AKI/CKD with progressively worsening renal function over the past 3 months.  Now with overt  uremia and asterixis.  She is willing to proceed with HD on short term trial of 1 week and then decide whether she would want to continue or stop HD.  I feel she will better be able to make decisions, once her uremia is treated.  Have consulted IR and TDC placed 12/30/21.  She unfortunately did not have her first HD session until 12/31/21.  Will plan for HD again today and tomorrow.  Extremely slow start HD due to increased risk for dialysis dysequilibrium (due to severe azotemia).  Will re-evaluate mentation and decision to continue or withdrawal from dialysis after serial HD.  UOP has picked up over the past 3 days but BUN/Cr remain elevated.  Diabetic foot ulcer/infection of right leg - evaluated by [facility] but arteriogram on hold due to worsening renal function. HTN - stable, hold meds prior to HD. Chronic diastolic CHF - stable Hyponatremia - due to AKI and impaired free water handling.  Continue to follow Anemia of CKD stage IV - low iron.  Will start IV iron with HD and likely add ESA.  Donetta Potts, MD [facility]

## 2022-01-01 NOTE — Progress Notes (Signed)
PROGRESS NOTE    [name]  UMP:[id number] DOB: [birth year] DOA: 12/17/2021 PCP: Jacklynn Ganong, MD   Brief Narrative: [name] is a 56 y.o. female with a history of CAD s/p CABG and multiple stents, chronic heart failure, chronic LE wounds, CKD stage IV, diabetes mellitus, OSA. Patient presented from [facility] ED secondary to right foot ulcer with concern for infection. Empiric antibiotics initiated. Podiatry and vascular surgery consulted, recommending local wound care. Hospitalization complicated by development of rash, likely related to antibiotics in addition to worsening AKI leading to initiation of hemodialysis.   Assessment and Plan:  Diabetic foot infection Right plantar surface wound. Patient with purulent drainage from wound. Patient follows with wound care as an outpatient. Recent x-ray and MRI imaging without evidence of osteomyelitis but with evidence of fluid collection concerning for phlegmon vs abscess. Patient is stable, without fevers or leukocytosis however with elevated ESR and CRP of 78 and 15 respectively. Patient started empirically on Vancomycin at outside hospital and transitioned to Ceftriaxone and Flagyl on admission. Recent ABIs with non-compressible  arteries. Complicated by severe CKD. Superficial culture obtained on 9/21 is significant for Citrobacter koseri and is pansensitive. Patient developed fever on 9/29. Repeat MRI (9/30) without evidence of osteomyelitis or abscess. Ceftriaxone transitioned to aztreonam secondary to rash. Vancomycin and Flagyl discontinued for persistent rash. Blood cultures with no growth. Antibiotics discontinued.  AKI on CKD stage IV Baseline creatinine of about 2.8-3. Creatinine of 3.51 on day prior to admission. Torsemide held. BUN/creatinine worsened to a peak of 161/4.89 respectively. TDC placed on 10/10 by interventional radiology and patient underwent hemodialysis on 10/10 with plan for continued HD this week and to then  decide on if long term HD will be needed/agreed to by patient. -Nephrology recommendations: plan for repeat HD on 10/12  Hyponatremia Secondary to AKI and impaired free water handling per nephrology. Now resolved.  Fever Uncertain etiology. Presumed secondary to foot infection. Blood cultures obtained and are no growth. Fever resolved.  Diabetes mellitus type 2 Patient is on long term insulin. Uncontrolled with hyperglycemia. Hemoglobin A1C of 6.9% from 10/2021. Patient is prescribed Lantus 35 units BID and Humalog 5 units TID with meals in addition to a sliding scale as an outpatient. Patient started on Semglee 30 units qHS and SSI during this admission -Continue Semglee 28 units BID and continue SSI  Chronic diastolic heart failure Stable. Torsemide held secondary to AKI  Elevated LFTs AST/ALT of 364/138 respectively and trended down. Total bilirubin of 1.2. RUQ with cholelithiasis without cholecystitis and borderline CBD dilation; no associated symptoms. Resolved.  CAD Patient is currently stable. She is on Effient, Ranexa, aspirin and Imdur as an outpatient.  OSA -Continue CPAP  Chronic anemia Baseline hemoglobin of 7-8. No evidence of acute bleeding. In setting of chronic kidney disease. Stable.  Rash Maculopapular rash on arms and upper legs. Presumed secondary to antibiotics. Rash on right upper thigh appears to be becoming more confluent resembling purpura associated tenderness. Patient treated with Solu-medrol and antibiotics discontinued with resolution of rash.   DVT prophylaxis: SCDs Code Status:   Code Status: Full Code Family Communication: None at bedside Disposition Plan: Discharge pending transition to outpatient antibiotic regimen and nephrology recommendations.   Consultants:  Podiatry Vascular surgery Nephrology  Procedures:  None  Antimicrobials: Ceftriaxone Flagyl Aztreonam Vancomycin   Subjective: Patient states she feels like bugs are  crawling on her at times. Continues to have concerns about hemodialysis.  Objective: BP (!) 115/43   Pulse  80   Temp 98.5 F (36.9 C) (Oral)   Resp 20   Ht _0  (1.702 m)   Wt (!) 153.5 kg   SpO2 98%   BMI 53.00 kg/m   Examination:  General exam: Appears calm and comfortable Respiratory system: Clear to auscultation. Respiratory effort normal. Cardiovascular system: S1 & S2 heard, RRR. Gastrointestinal system: Abdomen is nondistended, soft and nontender. Normal bowel sounds heard. Central nervous system: Alert and oriented. No focal neurological deficits. Musculoskeletal: No edema. No calf tenderness Skin: Skin pealing in areas of prior rash. Skin is dry. Psychiatry: Judgement and insight appear normal. Mood & affect appropriate.   Data Reviewed: I have personally reviewed following labs and imaging studies  CBC Lab Results  Component Value Date   WBC 7.2 01/01/2022   RBC 2.73 (L) 01/01/2022   HGB 7.4 (L) 01/01/2022   HCT 22.8 (L) 01/01/2022   MCV 83.5 01/01/2022   MCH 27.1 01/01/2022   PLT 124 (L) 01/01/2022   MCHC 32.5 01/01/2022   RDW 16.1 (H) 01/01/2022   LYMPHSABS 2.4 02/24/2010   MONOABS 0.6 02/24/2010   EOSABS 0.1 02/24/2010   BASOSABS 0.0 14/78/2956     Last metabolic panel Lab Results  Component Value Date   NA 138 01/01/2022   K 3.5 01/01/2022   CL 105 01/01/2022   CO2 25 01/01/2022   BUN 109 (H) 01/01/2022   CREATININE 2.88 (H) 01/01/2022   GLUCOSE 198 (H) 01/01/2022   GFRNONAA 18 (L) 01/01/2022   GFRAA 33 (L) 06/15/2019   CALCIUM 8.6 (L) 01/01/2022   PHOS 3.2 01/01/2022   PROT 6.9 12/30/2021   ALBUMIN 1.8 (L) 01/01/2022   LABGLOB 3.7 10/30/2021   AGRATIO 0.8 10/30/2021   BILITOT 0.9 12/30/2021   ALKPHOS 30 (L) 12/30/2021   AST 27 12/30/2021   ALT 21 12/30/2021   ANIONGAP 8 01/01/2022    GFR: Estimated Creatinine Clearance: 33.5 mL/min (A) (by C-G formula based on SCr of 2.88 mg/dL (H)).  No results found for this or any previous  visit (from the past 240 hour(s)).     Radiology Studies: IR Fluoro Guide CV Line Right  Result Date: 12/30/2021 INDICATION: 56 year old female referred for tunneled hemodialysis catheter EXAM: TUNNELED CENTRAL VENOUS HEMODIALYSIS CATHETER PLACEMENT WITH ULTRASOUND AND FLUOROSCOPIC GUIDANCE MEDICATIONS: 1 g vancomycin. The antibiotic was given in an appropriate time interval prior to skin puncture. ANESTHESIA/SEDATION: Moderate (conscious) sedation was not employed during this procedure. A total of Versed 0 mg and Fentanyl 25 mcg was administered intravenously by the radiology nurse. Total intra-service moderate Sedation Time: 0 minutes. The patient's level of consciousness and vital signs were monitored continuously by radiology nursing throughout the procedure under my direct supervision. FLUOROSCOPY: Radiation Exposure Index (as provided by the fluoroscopic device): 5 mGy Kerma COMPLICATIONS: None PROCEDURE: Informed written consent was obtained from the patient after a discussion of the risks, benefits, and alternatives to treatment. Questions regarding the procedure were encouraged and answered. The right neck and chest were prepped with chlorhexidine in a sterile fashion, and a sterile drape was applied covering the operative field. Maximum barrier sterile technique with sterile gowns and gloves were used for the procedure. A timeout was performed prior to the initiation of the procedure. Ultrasound survey was performed. The right internal jugular vein was confirmed to be patent, with images stored and sent to PACS. Micropuncture kit was utilized to access the right internal jugular vein under direct, real-time ultrasound guidance after the overlying soft tissues were anesthetized  with 1% lidocaine with epinephrine. Stab incision was made with 11 blade scalpel. Microwire was passed centrally. The microwire was then marked to measure appropriate internal catheter length. External tunneled length was  estimated. A total tip to cuff length of 19 cm was selected. 035 guidewire was advanced to the level of the IVC. Skin and subcutaneous tissues of chest wall below the clavicle were generously infiltrated with 1% lidocaine for local anesthesia. A small stab incision was made with 11 blade scalpel. The selected hemodialysis catheter was tunneled in a retrograde fashion from the anterior chest wall to the venotomy incision. Serial dilation was performed and then a peel-away sheath was placed. The catheter was then placed through the peel-away sheath with tips ultimately positioned within the superior aspect of the right atrium. Final catheter positioning was confirmed and documented with a spot radiographic image. The catheter aspirates and flushes normally. The catheter was flushed with appropriate volume heparin dwells. The catheter exit site was secured with a 0-Prolene retention suture. Gel-Foam slurry was infused into the soft tissue tract. The venotomy incision was closed Derma bond and sterile dressing. Dressings were applied at the chest wall. Patient tolerated the procedure well and remained hemodynamically stable throughout. No complications were encountered and no significant blood loss encountered. IMPRESSION: Status post placement of right IJ tunneled hemodialysis catheter Signed, Dulcy Fanny. Nadene Rubins, RPVI [facility] Radiology Electronically Signed   By: Corrie Mckusick D.O.   On: 12/30/2021 17:28   IR US Guide Vasc Access Right  Result Date: 12/30/2021 INDICATION: 55 year old female referred for tunneled hemodialysis catheter EXAM: TUNNELED CENTRAL VENOUS HEMODIALYSIS CATHETER PLACEMENT WITH ULTRASOUND AND FLUOROSCOPIC GUIDANCE MEDICATIONS: 1 g vancomycin. The antibiotic was given in an appropriate time interval prior to skin puncture. ANESTHESIA/SEDATION: Moderate (conscious) sedation was not employed during this procedure. A total of Versed 0 mg  and Fentanyl 25 mcg was administered intravenously by the radiology nurse. Total intra-service moderate Sedation Time: 0 minutes. The patient's level of consciousness and vital signs were monitored continuously by radiology nursing throughout the procedure under my direct supervision. FLUOROSCOPY: Radiation Exposure Index (as provided by the fluoroscopic device): 5 mGy Kerma COMPLICATIONS: None PROCEDURE: Informed written consent was obtained from the patient after a discussion of the risks, benefits, and alternatives to treatment. Questions regarding the procedure were encouraged and answered. The right neck and chest were prepped with chlorhexidine in a sterile fashion, and a sterile drape was applied covering the operative field. Maximum barrier sterile technique with sterile gowns and gloves were used for the procedure. A timeout was performed prior to the initiation of the procedure. Ultrasound survey was performed. The right internal jugular vein was confirmed to be patent, with images stored and sent to PACS. Micropuncture kit was utilized to access the right internal jugular vein under direct, real-time ultrasound guidance after the overlying soft tissues were anesthetized with 1% lidocaine with epinephrine. Stab incision was made with 11 blade scalpel. Microwire was passed centrally. The microwire was then marked to measure appropriate internal catheter length. External tunneled length was estimated. A total tip to cuff length of 19 cm was selected. 035 guidewire was advanced to the level of the IVC. Skin and subcutaneous tissues of chest wall below the clavicle were generously infiltrated with 1% lidocaine for local anesthesia. A small stab incision was made with 11 blade scalpel. The selected hemodialysis catheter was tunneled in a retrograde fashion from the anterior chest wall to the venotomy incision. Serial  dilation was performed and then a peel-away sheath was placed. The catheter was then placed  through the peel-away sheath with tips ultimately positioned within the superior aspect of the right atrium. Final catheter positioning was confirmed and documented with a spot radiographic image. The catheter aspirates and flushes normally. The catheter was flushed with appropriate volume heparin dwells. The catheter exit site was secured with a 0-Prolene retention suture. Gel-Foam slurry was infused into the soft tissue tract. The venotomy incision was closed Derma bond and sterile dressing. Dressings were applied at the chest wall. Patient tolerated the procedure well and remained hemodynamically stable throughout. No complications were encountered and no significant blood loss encountered. IMPRESSION: Status post placement of right IJ tunneled hemodialysis catheter Signed, Dulcy Fanny. Nadene Rubins, RPVI [facility] Inter[facility] Radiology Special[facility] Radiology Electronically Signed   By: Corrie Mckusick D.O.   On: 12/30/2021 17:28      LOS: 15 days    Cordelia Poche, MD [facility] 01/01/2022, 2:43 PM   If 7PM-7AM, please contact night-coverage [email or web address]

## 2022-01-01 NOTE — Progress Notes (Signed)
Nutrition Follow-up  DOCUMENTATION CODES:   Morbid obesity  INTERVENTION:  Liberalize diet from renal to carb modified to provide wider variety of menu options to meet nutrition needs Double protein portions with all meals Discontinue Ensure  Continue 30 ml ProSource Plus TID, each supplement provides 100 kcals and 15 grams protein.  Snacks daily between meals  NUTRITION DIAGNOSIS:   Increased nutrient needs related to wound healing as evidenced by other (comment) (increased metabolic need for healing).  Ongoing  GOAL:   Patient will meet greater than or equal to 90% of their needs  progressing  MONITOR:   PO intake, Supplement acceptance  REASON FOR ASSESSMENT:   Consult Wound healing  ASSESSMENT:   Pt is a 56yo F with PMH of CAD s/p CABG and multiple stents, HFpEF, HTN, HLD, chronic LE wounds, CKD 4, IDDM, and OSA on CPAP who presents for evaluation of R foot ulcer.  TDC placed 10/10 HD started 10/11. Plans for another session today and tomorrow.   Pt waiting to be taken for dialysis. Pt reports limited PO intake d/t not enjoying hospital food. She states that she does try to eat a little from each meal. She has not been consuming Ensure as she feels that these upset her stomach. She reports having frequent formed BM's but does not want to cause any more increase in BM's.   Pt states that she is lactose intolerant and also cannot tolerate artificial sweeteners without a significant flare in diverticulitis. She is agreeable to addition of snacks between meals to increase frequency of PO intake. Will also order double protein with meals.   Meal completions: 10/10: 100% dinner 10/11: 100% lunch, 50% dinner 10/12: 75% breakfast, 70% lunch  Pt reports being a very spiritual person. Offered chaplain support but pt reports that her pastor is coming to visit and declined at this time.   Post HD weight (10/11) 155.1 kg Net UF 1L   Edema: mild pitting  BLE  Medications: SSI 0-15 units TID, SSI 0-5 units qhs, semglee 28 units BID, torsemide  Labs: sodium 138 (wdl), potassium 3.5 (wdl), BUN 109, Cr 2.88, corrected Ca 10.36, GFR 18, CBG's 148-172 x24 hours  UOP: 1711m x24 hours + 609mx12 hours I/O's: [id number] 9/28  Diet Order:   Diet Order             Diet renal with fluid restriction Fluid restriction: 1200 mL Fluid; Room service appropriate? Yes; Fluid consistency: Thin  Diet effective now                   EDUCATION NEEDS:   Education needs have been addressed  Skin:  Skin Assessment: Skin Integrity Issues: Skin Integrity Issues:: Stage II, Other (Comment) Stage II: bilateral buttocks Other: R DM foot wound; MASD L abd; IAD bilateral perineum, labia  Last BM:  10/4 (type 5)  Height:   Ht Readings from Last 1 Encounters:  12/17/21 '5\' 7"'$  (1.702 m)    Weight:   Wt Readings from Last 1 Encounters:  01/01/22 (!) 152.8 kg    Ideal Body Weight:  61.4 kg  BMI:  Body mass index is 52.76 kg/m.  Estimated Nutritional Needs:   Kcal:  1800-2000  Protein:  100-120g  Fluid:  >/=1.8L  AllClay[place] DanaDN, LDN Clinical Nutrition

## 2022-01-01 NOTE — Plan of Care (Signed)

## 2022-01-02 ENCOUNTER — Encounter (HOSPITAL_BASED_OUTPATIENT_CLINIC_OR_DEPARTMENT_OTHER): Payer: Medicare Other | Admitting: Internal Medicine

## 2022-01-02 DIAGNOSIS — E11628 Type 2 diabetes mellitus with other skin complications: Secondary | ICD-10-CM | POA: Diagnosis not present

## 2022-01-02 DIAGNOSIS — I5032 Chronic diastolic (congestive) heart failure: Secondary | ICD-10-CM | POA: Diagnosis not present

## 2022-01-02 DIAGNOSIS — D638 Anemia in other chronic diseases classified elsewhere: Secondary | ICD-10-CM | POA: Diagnosis not present

## 2022-01-02 DIAGNOSIS — N179 Acute kidney failure, unspecified: Secondary | ICD-10-CM | POA: Diagnosis not present

## 2022-01-02 LAB — RENAL FUNCTION PANEL
Albumin: 1.8 g/dL — ABNORMAL LOW (ref 3.5–5.0)
Anion gap: 10 (ref 5–15)
BUN: 62 mg/dL — ABNORMAL HIGH (ref 6–20)
CO2: 27 mmol/L (ref 22–32)
Calcium: 8.3 mg/dL — ABNORMAL LOW (ref 8.9–10.3)
Chloride: 103 mmol/L (ref 98–111)
Creatinine, Ser: 1.99 mg/dL — ABNORMAL HIGH (ref 0.44–1.00)
GFR, Estimated: 29 mL/min — ABNORMAL LOW (ref >60)
Glucose, Bld: 186 mg/dL — ABNORMAL HIGH (ref 70–99)
Phosphorus: 2.5 mg/dL (ref 2.5–4.6)
Potassium: 3.4 mmol/L — ABNORMAL LOW (ref 3.5–5.1)
Sodium: 140 mmol/L (ref 135–145)

## 2022-01-02 LAB — GLUCOSE, CAPILLARY
Glucose-Capillary: 185 mg/dL — ABNORMAL HIGH (ref 70–99)
Glucose-Capillary: 188 mg/dL — ABNORMAL HIGH (ref 70–99)
Glucose-Capillary: 223 mg/dL — ABNORMAL HIGH (ref 70–99)

## 2022-01-02 MED ORDER — HEPARIN SODIUM (PORCINE) 1000 UNIT/ML IJ SOLN
INTRAMUSCULAR | Status: AC
  Administered 2022-01-02: 1000 [IU]
  Filled 2022-01-02: qty 4

## 2022-01-02 NOTE — Progress Notes (Signed)
Patient had concerns of why she didn't receive her Torsemide 40 mg at 1800,I explained that she was in HD and not available and the RN charted not given due to not present. She then wanted me to page the MD because MD would think she is not having out put. I informed her that it is documented that you were not present and didn't receive MD and that I would leave a note explaining why she didn't receive the medication

## 2022-01-02 NOTE — Progress Notes (Signed)
Patient still refusing CPAP. No unit was in room order D/Cd.

## 2022-01-02 NOTE — TOC Progression Note (Signed)
Transition of Care [facility]) - Progression Note    Patient Details  Name: [name] MRN: [id number] Date of Birth: 11-20-1965  Transition [facility] CM/SW [place], [place] Phone Number: 01/02/2022, 12:01 PM  Clinical Narrative:     CSW was notified by PMT that pt may be more willing to consider SNF. CSW met with pt bedside; son [name] was present. CSW revisited snf rec with pt; explained snf workup process. Pt states that she does not want to go to SNF and plans to return home at [place] included pt's son in conversation explaining that [facility] can be arranged but care giving would have to be provided or arranged by family out of pocket. Son states that him and his sister both work but that they can try and coordinate schedules to be present at their moms home at [place]. Pt son calls pt's daughter, [name], on speaker phone. CSW updates [name] and explains that family would need to provide suppervision/ support at [place] if pt is to return home. [name] expressed it would be difficult for the family but that she understands pt would have to consent and she is also hesitant about rehab facilities. CSW provides instructions to access list of SNFs and their medicare star ratings in case family finds one they think pt may consider. Daughter expresses that pt is unlikely to consent to SNF and family will likely plan for pt to return home with [facility]. They will notify staff if pt changes her mind after family involvement in decision.   Expected Discharge Plan: [place] Barriers to Discharge: Continued Medical Work up  Expected Discharge Plan and Services Expected Discharge Plan: [place] arrangements for the past 2 months: Single Family Home                                       Social Determinants of Health (SDOH) Interventions    Readmission Risk Interventions    12/18/2021    2:02 PM 11/05/2021    1:58 PM  Readmission Risk  Prevention Plan  Transportation Screening Complete Complete  PCP or Specialist Appt within 5-7 Days  Complete  PCP or Specialist Appt within 3-5 Days Complete   Home Care Screening  Complete  Medication Review (RN CM)  Complete  HRI or Home Care Consult Complete   Social Work Consult for [place] Planning/Counseling Complete   Palliative Care Screening Not Applicable   Medication Review Press photographer) Complete

## 2022-01-02 NOTE — Progress Notes (Signed)
PROGRESS NOTE    [name]  LTJ:[id number] DOB: 02/22/1966 DOA: 12/17/2021 PCP: Jacklynn Ganong, MD   Brief Narrative: [name] is a 56 y.o. female with a history of CAD s/p CABG and multiple stents, chronic heart failure, chronic LE wounds, CKD stage IV, diabetes mellitus, OSA. Patient presented from [facility] ED secondary to right foot ulcer with concern for infection. Empiric antibiotics initiated. Podiatry and vascular surgery consulted, recommending local wound care. Hospitalization complicated by development of rash, likely related to antibiotics in addition to worsening AKI leading to initiation of hemodialysis.   Assessment and Plan:  Diabetic foot infection Right plantar surface wound. Patient with purulent drainage from wound. Patient follows with wound care as an outpatient. Recent x-ray and MRI imaging without evidence of osteomyelitis but with evidence of fluid collection concerning for phlegmon vs abscess. Patient is stable, without fevers or leukocytosis however with elevated ESR and CRP of 78 and 15 respectively. Patient started empirically on Vancomycin at outside hospital and transitioned to Ceftriaxone and Flagyl on admission. Recent ABIs with non-compressible  arteries. Complicated by severe CKD. Superficial culture obtained on 9/21 is significant for Citrobacter koseri and is pansensitive. Patient developed fever on 9/29. Repeat MRI (9/30) without evidence of osteomyelitis or abscess. Ceftriaxone transitioned to aztreonam secondary to rash. Vancomycin and Flagyl discontinued for persistent rash. Blood cultures with no growth. Antibiotics discontinued.  AKI on CKD stage IV Baseline creatinine of about 2.8-3. Creatinine of 3.51 on day prior to admission. Torsemide held. BUN/creatinine worsened to a peak of 161/4.89 respectively. TDC placed on 10/10 by interventional radiology and patient underwent hemodialysis on 10/10 with plan for continued HD this week and to then  decide on if long term HD will be needed/agreed to by patient. -Nephrology recommendations: continually assessing need for continued HD  Hyponatremia Secondary to AKI and impaired free water handling per nephrology. Now resolved.  Fever Uncertain etiology. Presumed secondary to foot infection. Blood cultures obtained and are no growth. Fever resolved.  Diabetes mellitus type 2 Patient is on long term insulin. Uncontrolled with hyperglycemia. Hemoglobin A1C of 6.9% from 10/2021. Patient is prescribed Lantus 35 units BID and Humalog 5 units TID with meals in addition to a sliding scale as an outpatient. Patient started on Semglee 30 units qHS and SSI during this admission -Continue Semglee 28 units BID and continue SSI  Chronic diastolic heart failure Stable. Torsemide held secondary to AKI  Elevated LFTs AST/ALT of 364/138 respectively and trended down. Total bilirubin of 1.2. RUQ with cholelithiasis without cholecystitis and borderline CBD dilation; no associated symptoms. Resolved.  CAD Patient is currently stable. She is on Effient, Ranexa, aspirin and Imdur as an outpatient.  OSA -Continue CPAP  Chronic anemia Baseline hemoglobin of 7-8. No evidence of acute bleeding. In setting of chronic kidney disease. Stable.  Rash Maculopapular rash on arms and upper legs. Presumed secondary to antibiotics. Rash on right upper thigh appears to be becoming more confluent resembling purpura associated tenderness. Patient treated with Solu-medrol and antibiotics discontinued with resolution of rash.   DVT prophylaxis: SCDs Code Status:   Code Status: Full Code Family Communication: Son at bedside Disposition Plan: Discharge pending transition to outpatient antibiotic regimen and nephrology recommendations.   Consultants:  Podiatry Vascular surgery Nephrology  Procedures:  None  Antimicrobials: Ceftriaxone Flagyl Aztreonam Vancomycin   Subjective: Isolated fever overnight. No  concerns per patient.  Objective: BP (!) 106/41   Pulse 90   Temp 99.9 F (37.7 C) (Oral)  Resp 18   Ht 5' 7"  (1.702 m)   Wt (!) 147.1 kg   SpO2 96%   BMI 50.79 kg/m   Examination:  General exam: Appears calm and comfortable Respiratory system: Clear to auscultation. Respiratory effort normal. Cardiovascular system: S1 & S2 heard, RRR. Gastrointestinal system: Abdomen is nondistended, soft and nontender. Normal bowel sounds heard. Central nervous system: Alert and oriented. Asterixis. Psychiatry: Judgement and insight appear normal. Mood & affect appropriate.    Data Reviewed: I have personally reviewed following labs and imaging studies  CBC Lab Results  Component Value Date   WBC 7.2 01/01/2022   RBC 2.73 (L) 01/01/2022   HGB 7.4 (L) 01/01/2022   HCT 22.8 (L) 01/01/2022   MCV 83.5 01/01/2022   MCH 27.1 01/01/2022   PLT 124 (L) 01/01/2022   MCHC 32.5 01/01/2022   RDW 16.1 (H) 01/01/2022   LYMPHSABS 2.4 02/24/2010   MONOABS 0.6 02/24/2010   EOSABS 0.1 02/24/2010   BASOSABS 0.0 35/24/8185     Last metabolic panel Lab Results  Component Value Date   NA 140 01/02/2022   K 3.4 (L) 01/02/2022   CL 103 01/02/2022   CO2 27 01/02/2022   BUN 62 (H) 01/02/2022   CREATININE 1.99 (H) 01/02/2022   GLUCOSE 186 (H) 01/02/2022   GFRNONAA 29 (L) 01/02/2022   GFRAA 33 (L) 06/15/2019   CALCIUM 8.3 (L) 01/02/2022   PHOS 2.5 01/02/2022   PROT 6.9 12/30/2021   ALBUMIN 1.8 (L) 01/02/2022   LABGLOB 3.7 10/30/2021   AGRATIO 0.8 10/30/2021   BILITOT 0.9 12/30/2021   ALKPHOS 30 (L) 12/30/2021   AST 27 12/30/2021   ALT 21 12/30/2021   ANIONGAP 10 01/02/2022    GFR: Estimated Creatinine Clearance: 47.2 mL/min (A) (by C-G formula based on SCr of 1.99 mg/dL (H)).  No results found for this or any previous visit (from the past 240 hour(s)).     Radiology Studies: No results found.    LOS: 16 days    Cordelia Poche, MD [facility] 01/02/2022, 2:57 PM   If  7PM-7AM, please contact night-coverage [email or web address]

## 2022-01-02 NOTE — Progress Notes (Signed)
Physical Therapy Treatment Patient Details Name: [name] MRN: [id number] DOB: Dec 24, 1965 Today's Date: 01/02/2022   History of Present Illness Pt is a 56 y/o F who presented to [facility] on 12/11/21 with R foot ulcers with concern of infection, transferred to [facility] on 9/27. Imaging revealed R foot swelling, no clear evidence of osteomyelitis. Workup for Rt foot diabetic infection, AKI on CKD, and fever. 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  Pt is also hindered by skin on BLE's, esp the R foot and L lower leg issues.  Follow along with her to progress standing but with her dense weakness, inability to tolerate standing and dependent mobility will still recommend SNF care following acute care.  Her plan is appropriate given her limited family assist afterward and inability to walk that will make immediate return home untenable.     Recommendations for follow up therapy are one component of a multi-disciplinary discharge planning process, led by the attending physician.  Recommendations may be updated based on patient status, additional functional criteria and insurance authorization.  Follow Up Recommendations  Skilled nursing-short term rehab (<3 hours/day) Can patient physically be transported by private vehicle: No   Assistance Recommended at Discharge Frequent or constant Supervision/Assistance  Patient can return home with the following A lot of help with walking and/or transfers;A lot of help with bathing/dressing/bathroom;Assistance with cooking/housework;Assist for transportation;Help with stairs or ramp for entrance   Equipment Recommendations  None recommended by PT     Recommendations for Other Services       Precautions / Restrictions Precautions Precautions: Fall Precaution Comments: monitor LE skin Restrictions Weight Bearing Restrictions: No     Mobility  Bed Mobility               General bed mobility comments: declined OOB    Transfers                        Ambulation/Gait                   Stairs             Wheelchair Mobility    Modified Rankin (Stroke Patients Only)       Balance                                            Cognition Arousal/Alertness: Lethargic Behavior During Therapy: Flat affect Overall Cognitive Status: Within Functional Limits for tasks assessed                     Current Attention Level: Sustained           General Comments: quite lethargic and having minimal conversation but when talking is very on point and clear        Exercises General Exercises - Lower Extremity Ankle Circles/Pumps: AAROM, 5 reps Quad Sets: AROM, 10 reps Heel Slides: AAROM, 10 reps Hip ABduction/ADduction: AAROM, 10 reps Straight Leg Raises: AAROM, 10 reps    General Comments General comments (skin  integrity, edema, etc.): pt was lethargic and agreed to assisted bed ex, but is requiring help esp on LLE and R hip for full ROM to move esp to protect skin on lower legs      Pertinent Vitals/Pain Pain Assessment Pain Assessment: Faces Faces Pain Scale: Hurts little more Pain Location: LLE Pain Descriptors / Indicators: Guarding Pain Intervention(s): Limited activity within patient's tolerance, Monitored during session, Repositioned    Home Living                          Prior Function            PT Goals (current goals can now be found in the care plan section)      Frequency    Min 3X/week      PT Plan Current plan remains appropriate    Co-evaluation              AM-PAC PT "6 Clicks" Mobility   Outcome  Measure  Help needed turning from your back to your side while in a flat bed without using bedrails?: A Lot Help needed moving from lying on your back to sitting on the side of a flat bed without using bedrails?: A Lot Help needed moving to and from a bed to a chair (including a wheelchair)?: A Lot Help needed standing up from a chair using your arms (e.g., wheelchair or bedside chair)?: A Lot Help needed to walk in hospital room?: Total Help needed climbing 3-5 steps with a railing? : Total 6 Click Score: 10    End of Session   Activity Tolerance: Patient limited by fatigue Patient left: in bed;with call bell/phone within reach;with bed alarm set Nurse Communication: Mobility status PT Visit Diagnosis: Muscle weakness (generalized) (M62.81)     Time: 5102-5852 PT Time Calculation (min) (ACUTE ONLY): 16 min  Charges:  $Therapeutic Exercise: 8-22 mins       Ramond Dial 01/02/2022, 3:54 PM  Mee Hives, PT PhD Acute Rehab Dept. Number: [place] and [facility]

## 2022-01-02 NOTE — Plan of Care (Signed)
  Problem: Nutrition: Goal: Adequate nutrition will be maintained Outcome: Completed/Met

## 2022-01-02 NOTE — Progress Notes (Signed)
Patient ID: [name], female   DOB: [birth year], 56 y.o.   MRN: [id number] S: No new complaints O:BP (!) 106/41   Pulse 90   Temp 99.9 F (37.7 C) (Oral)   Resp 18   Ht '5\' 7"'$  (1.702 m)   Wt (!) 147.1 kg   SpO2 96%   BMI 50.79 kg/m   Intake/Output Summary (Last 24 hours) at 01/02/2022 1220 Last data filed at 01/02/2022 0839 Gross per 24 hour  Intake 237 ml  Output 2401 ml  Net -2164 ml   Intake/Output: I/O last 3 completed shifts: In: 717 [P.O.:717] Out: 2703 [Urine:1750; Other:2100; Stool:1]  Intake/Output this shift:  Total I/O In: 237 [P.O.:237] Out: -  Weight change: -0.7 kg Gen: NAD CVS: RRR Resp:CTA Abd: +BS, soft, NT/ND Ext: brawny edema b/l Neuro: +asterixis  Recent Labs  Lab 12/28/21 0044 12/29/21 0258 12/30/21 0148 12/30/21 1829 12/31/21 0429 01/01/22 0032 01/02/22 0119  NA 130* 132* 131* 138 137 138 140  K 3.6 3.8 3.7 3.8 3.2* 3.5 3.4*  CL 97* 101 98 105 102 105 103  CO2 20* 22 20* '22 24 25 27  '$ GLUCOSE 242* 244* 248* 180* 165* 198* 186*  BUN 155* 161* 155* 150* 111* 109* 62*  CREATININE 4.89* 4.50* 4.16* 3.72* 2.93* 2.88* 1.99*  ALBUMIN 2.0* 1.9* 2.0* 2.0*  2.0* 1.8* 1.8* 1.8*  CALCIUM 8.6* 8.6* 8.4* 8.8* 8.4* 8.6* 8.3*  PHOS 5.1* 5.4* 4.8* 4.5 3.2 3.2 2.5  AST  --   --   --  27  --   --   --   ALT  --   --   --  21  --   --   --    Liver Function Tests: Recent Labs  Lab 12/30/21 1829 12/31/21 0429 01/01/22 0032 01/02/22 0119  AST 27  --   --   --   ALT 21  --   --   --   ALKPHOS 30*  --   --   --   BILITOT 0.9  --   --   --   PROT 6.9  --   --   --   ALBUMIN 2.0*  2.0* 1.8* 1.8* 1.8*   No results for input(s): "LIPASE", "AMYLASE" in the last 168 hours. No results for input(s): "AMMONIA" in the last 168 hours. CBC: Recent Labs  Lab 12/29/21 0258 12/30/21 0148 12/30/21 1829 12/31/21 0429 01/01/22 0032  WBC 10.3 9.3 7.4 7.4 7.2  HGB 7.4* 7.5* 7.6* 7.3* 7.4*  HCT 22.0* 24.1* 23.0* 22.3* 22.8*  MCV 82.7 84.9 82.4 81.7 83.5   PLT 143* 147* 135* 133* 124*   Cardiac Enzymes: No results for input(s): "CKTOTAL", "CKMB", "CKMBINDEX", "TROPONINI" in the last 168 hours. CBG: Recent Labs  Lab 01/01/22 0559 01/01/22 1043 01/01/22 2119 01/02/22 0619 01/02/22 1122  GLUCAP 177* 172* 123* 185* 188*    Iron Studies: No results for input(s): "IRON", "TIBC", "TRANSFERRIN", "FERRITIN" in the last 72 hours. Studies/Results: No results found.  (feeding supplement) PROSource Plus  30 mL Oral TID BM   carvedilol  12.5 mg Oral BID WC   Chlorhexidine Gluconate Cloth  6 each Topical Q0600   gabapentin  100 mg Oral QHS   hydrocerin   Topical TID   insulin aspart  0-15 Units Subcutaneous TID WC   insulin aspart  0-5 Units Subcutaneous QHS   insulin glargine-yfgn  28 Units Subcutaneous BID   isosorbide mononitrate  60 mg Oral q morning   rosuvastatin  5 mg Oral Daily   torsemide  40 mg Oral BID   triamcinolone cream   Topical BID    BMET    Component Value Date/Time   NA 140 01/02/2022 0119   K 3.4 (L) 01/02/2022 0119   CL 103 01/02/2022 0119   CO2 27 01/02/2022 0119   GLUCOSE 186 (H) 01/02/2022 0119   BUN 62 (H) 01/02/2022 0119   CREATININE 1.99 (H) 01/02/2022 0119   CALCIUM 8.3 (L) 01/02/2022 0119   GFRNONAA 29 (L) 01/02/2022 0119   GFRAA 33 (L) 06/15/2019 1237   CBC    Component Value Date/Time   WBC 7.2 01/01/2022 0032   RBC 2.73 (L) 01/01/2022 0032   HGB 7.4 (L) 01/01/2022 0032   HCT 22.8 (L) 01/01/2022 0032   PLT 124 (L) 01/01/2022 0032   MCV 83.5 01/01/2022 0032   MCH 27.1 01/01/2022 0032   MCHC 32.5 01/01/2022 0032   RDW 16.1 (H) 01/01/2022 0032   LYMPHSABS 2.4 02/24/2010 1320   MONOABS 0.6 02/24/2010 1320   EOSABS 0.1 02/24/2010 1320   BASOSABS 0.0 02/24/2010 1320      Assessment/Plan:   AKI/CKD stage IV - has had multiple episodes of AKI/CKD with progressively worsening renal function over the past 3 months.  Now with overt uremia and asterixis.  She is willing to proceed with HD on  short term trial of 1 week and then decide whether she would want to continue or stop HD.  I feel she will better be able to make decisions, once her uremia is treated.  [place] placed 12/30/21 by IR.  First HD session 12/31/21.  Had HD yesterday and plan for HD again today.  Extremely slow start HD due to increased risk for dialysis dysequilibrium (due to severe azotemia).  Will re-evaluate mentation and decision to continue or withdrawal from dialysis after serial HD.  UOP has picked up over the past 3 days but BUN/Cr remain elevated.  Diabetic foot ulcer/infection of right leg - evaluated by VVS but arteriogram on hold due to worsening renal function. HTN - stable, hold meds prior to HD. Chronic diastolic CHF - stable Hyponatremia - due to AKI and impaired free water handling.  Continue to follow Anemia of CKD stage IV - low iron.  Will start IV iron with HD and likely add ESA.  Donetta Potts, MD [name]

## 2022-01-02 NOTE — Progress Notes (Signed)
Received patient in bed to unit.  Alert and oriented.  Informed consent signed and in chart.   Treatment initiated: 9024 Treatment completed: [place]  Patient tolerated well.  Transported back to the room  Alert, without acute distress.  Hand-off given to patient's nurse.   Access used: catheter Access issues: none  Total UF removed: 1000 Medication(s) given: none Post HD VS: 98, 113/39(59), HR-88, RR-13, SP02-99 Post HD weight: 145.9kg   [facility]

## 2022-01-02 NOTE — Progress Notes (Addendum)
Inpatient Diabetes Program Recommendations  AACE/ADA: New Consensus Statement on Inpatient Glycemic Control (2015)  Target Ranges:  Prepandial:   less than 140 mg/dL      Peak postprandial:   less than 180 mg/dL (1-2 hours)      Critically ill patients:  140 - 180 mg/dL   Lab Results  Component Value Date   GLUCAP 188 (H) 01/02/2022   HGBA1C 6.9 (H) 10/25/2021    Review of Glycemic Control  Latest Reference Range & Units 01/01/22 21:19 01/02/22 06:19 01/02/22 11:22  Glucose-Capillary 70 - 99 mg/dL 123 (H) 185 (H) 188 (H)  Diabetes history: DM 2 Outpatient Diabetes medications:  Lantus 54 units bid, Novolog 20 units tid with meals Current orders for Inpatient glycemic control:  Novolog 0-15 units tid with meals  Semglee 28 units bid  Inpatient Diabetes Program Recommendations:    Consider slight increase of Semglee to 30 units bid.   Thanks,  Adah Perl, RN, BC-ADM Inpatient Diabetes Coordinator Pager [phone]  (8a-5p)

## 2022-01-03 DIAGNOSIS — I5032 Chronic diastolic (congestive) heart failure: Secondary | ICD-10-CM | POA: Diagnosis not present

## 2022-01-03 DIAGNOSIS — N179 Acute kidney failure, unspecified: Secondary | ICD-10-CM | POA: Diagnosis not present

## 2022-01-03 DIAGNOSIS — E11628 Type 2 diabetes mellitus with other skin complications: Secondary | ICD-10-CM | POA: Diagnosis not present

## 2022-01-03 DIAGNOSIS — D638 Anemia in other chronic diseases classified elsewhere: Secondary | ICD-10-CM | POA: Diagnosis not present

## 2022-01-03 LAB — CBC
HCT: 26.1 % — ABNORMAL LOW (ref 36.0–46.0)
HCT: 26.1 % — ABNORMAL LOW (ref 36.0–46.0)
Hemoglobin: 8.4 g/dL — ABNORMAL LOW (ref 12.0–15.0)
Hemoglobin: 8 g/dL — ABNORMAL LOW (ref 12.0–15.0)
MCH: 27.5 pg (ref 26.0–34.0)
MCH: 26.4 pg (ref 26.0–34.0)
MCHC: 32.2 g/dL (ref 30.0–36.0)
MCHC: 30.7 g/dL (ref 30.0–36.0)
MCV: 85.3 fL (ref 80.0–100.0)
MCV: 86.1 fL (ref 80.0–100.0)
Platelets: 130 10*3/uL — ABNORMAL LOW (ref 150–400)
Platelets: 141 10*3/uL — ABNORMAL LOW (ref 150–400)
RBC: 3.06 MIL/uL — ABNORMAL LOW (ref 3.87–5.11)
RBC: 3.03 MIL/uL — ABNORMAL LOW (ref 3.87–5.11)
RDW: 16.3 % — ABNORMAL HIGH (ref 11.5–15.5)
RDW: 16.2 % — ABNORMAL HIGH (ref 11.5–15.5)
WBC: 8 10*3/uL (ref 4.0–10.5)
WBC: 8.1 10*3/uL (ref 4.0–10.5)
nRBC: 0.3 % — ABNORMAL HIGH (ref 0.0–0.2)
nRBC: 0 % (ref 0.0–0.2)

## 2022-01-03 LAB — RENAL FUNCTION PANEL
Albumin: 2.1 g/dL — ABNORMAL LOW (ref 3.5–5.0)
Anion gap: 7 (ref 5–15)
BUN: 36 mg/dL — ABNORMAL HIGH (ref 6–20)
CO2: 26 mmol/L (ref 22–32)
Calcium: 8.2 mg/dL — ABNORMAL LOW (ref 8.9–10.3)
Chloride: 103 mmol/L (ref 98–111)
Creatinine, Ser: 1.76 mg/dL — ABNORMAL HIGH (ref 0.44–1.00)
GFR, Estimated: 33 mL/min — ABNORMAL LOW (ref >60)
Glucose, Bld: 234 mg/dL — ABNORMAL HIGH (ref 70–99)
Phosphorus: 1.6 mg/dL — ABNORMAL LOW (ref 2.5–4.6)
Potassium: 4.1 mmol/L (ref 3.5–5.1)
Sodium: 136 mmol/L (ref 135–145)

## 2022-01-03 LAB — GLUCOSE, CAPILLARY
Glucose-Capillary: 157 mg/dL — ABNORMAL HIGH (ref 70–99)
Glucose-Capillary: 205 mg/dL — ABNORMAL HIGH (ref 70–99)
Glucose-Capillary: 167 mg/dL — ABNORMAL HIGH (ref 70–99)
Glucose-Capillary: 151 mg/dL — ABNORMAL HIGH (ref 70–99)

## 2022-01-03 MED ORDER — DARBEPOETIN ALFA 60 MCG/0.3ML IJ SOSY
60.0000 ug | PREFILLED_SYRINGE | INTRAMUSCULAR | Status: DC
  Administered 2022-01-03: 60 ug via SUBCUTANEOUS
  Filled 2022-01-03: qty 0.3

## 2022-01-03 MED ORDER — SODIUM CHLORIDE 0.9 % IV SOLN
510.0000 mg | Freq: Once | INTRAVENOUS | Status: AC
  Administered 2022-01-03: 510 mg via INTRAVENOUS
  Filled 2022-01-03: qty 17

## 2022-01-03 NOTE — Progress Notes (Signed)
Patient ID: Selam Pietsch, female   DOB: [birth year], 56 y.o.   MRN: [id number] S: No new complaints.  Tolerated 3rd HD session well. O:BP (!) 119/58   Pulse 76   Temp 100 F (37.8 C) (Oral)   Resp 18   Ht '5\' 7"'$  (1.702 m)   Wt (!) 143.8 kg   SpO2 99%   BMI 49.65 kg/m   Intake/Output Summary (Last 24 hours) at 01/03/2022 1102 Last data filed at 01/03/2022 0243 Gross per 24 hour  Intake 120 ml  Output 1600 ml  Net -1480 ml   Intake/Output: I/O last 3 completed shifts: In: 88 [P.O.:357] Out: 1901 [Urine:900; Other:1000; Stool:1]  Intake/Output this shift:  No intake/output data recorded. Weight change: -7.195 kg Gen: NAD CVS:RRR Resp: CTA Abd: obese, +BS, soft, NT/nD Ext: brawny edema BLE Derm:  desquamative eruption over upper body and arms, improving, no erythema  Recent Labs  Lab 12/29/21 0258 12/30/21 0148 12/30/21 1829 12/31/21 0429 01/01/22 0032 01/02/22 0119 01/03/22 0034  NA 132* 131* 138 137 138 140 136  K 3.8 3.7 3.8 3.2* 3.5 3.4* 4.1  CL 101 98 105 102 105 103 103  CO2 22 20* '22 24 25 27 26  '$ GLUCOSE 244* 248* 180* 165* 198* 186* 234*  BUN 161* 155* 150* 111* 109* 62* 36*  CREATININE 4.50* 4.16* 3.72* 2.93* 2.88* 1.99* 1.76*  ALBUMIN 1.9* 2.0* 2.0*  2.0* 1.8* 1.8* 1.8* 2.1*  CALCIUM 8.6* 8.4* 8.8* 8.4* 8.6* 8.3* 8.2*  PHOS 5.4* 4.8* 4.5 3.2 3.2 2.5 1.6*  AST  --   --  27  --   --   --   --   ALT  --   --  21  --   --   --   --    Liver Function Tests: Recent Labs  Lab 12/30/21 1829 12/31/21 0429 01/01/22 0032 01/02/22 0119 01/03/22 0034  AST 27  --   --   --   --   ALT 21  --   --   --   --   ALKPHOS 30*  --   --   --   --   BILITOT 0.9  --   --   --   --   PROT 6.9  --   --   --   --   ALBUMIN 2.0*  2.0*   < > 1.8* 1.8* 2.1*   < > = values in this interval not displayed.   No results for input(s): "LIPASE", "AMYLASE" in the last 168 hours. No results for input(s): "AMMONIA" in the last 168 hours. CBC: Recent Labs  Lab 12/30/21 1829  12/31/21 0429 01/01/22 0032 01/03/22 0034 01/03/22 0959  WBC 7.4 7.4 7.2 8.0 8.1  HGB 7.6* 7.3* 7.4* 8.4* 8.0*  HCT 23.0* 22.3* 22.8* 26.1* 26.1*  MCV 82.4 81.7 83.5 85.3 86.1  PLT 135* 133* 124* 130* 141*   Cardiac Enzymes: No results for input(s): "CKTOTAL", "CKMB", "CKMBINDEX", "TROPONINI" in the last 168 hours. CBG: Recent Labs  Lab 01/01/22 2119 01/02/22 0619 01/02/22 1122 01/02/22 2156 01/03/22 0612  GLUCAP 123* 185* 188* 223* 157*    Iron Studies: No results for input(s): "IRON", "TIBC", "TRANSFERRIN", "FERRITIN" in the last 72 hours. Studies/Results: No results found.  (feeding supplement) PROSource Plus  30 mL Oral TID BM   carvedilol  12.5 mg Oral BID WC   Chlorhexidine Gluconate Cloth  6 each Topical Q0600   gabapentin  100 mg Oral QHS   hydrocerin  Topical TID   insulin aspart  0-15 Units Subcutaneous TID WC   insulin aspart  0-5 Units Subcutaneous QHS   insulin glargine-yfgn  28 Units Subcutaneous BID   isosorbide mononitrate  60 mg Oral q morning   rosuvastatin  5 mg Oral Daily   torsemide  40 mg Oral BID   triamcinolone cream   Topical BID    BMET    Component Value Date/Time   NA 136 01/03/2022 0034   K 4.1 01/03/2022 0034   CL 103 01/03/2022 0034   CO2 26 01/03/2022 0034   GLUCOSE 234 (H) 01/03/2022 0034   BUN 36 (H) 01/03/2022 0034   CREATININE 1.76 (H) 01/03/2022 0034   CALCIUM 8.2 (L) 01/03/2022 0034   GFRNONAA 33 (L) 01/03/2022 0034   GFRAA 33 (L) 06/15/2019 1237   CBC    Component Value Date/Time   WBC 8.1 01/03/2022 0959   RBC 3.03 (L) 01/03/2022 0959   HGB 8.0 (L) 01/03/2022 0959   HCT 26.1 (L) 01/03/2022 0959   PLT 141 (L) 01/03/2022 0959   MCV 86.1 01/03/2022 0959   MCH 26.4 01/03/2022 0959   MCHC 30.7 01/03/2022 0959   RDW 16.2 (H) 01/03/2022 0959   LYMPHSABS 2.4 02/24/2010 1320   MONOABS 0.6 02/24/2010 1320   EOSABS 0.1 02/24/2010 1320   BASOSABS 0.0 02/24/2010 1320    Assessment/Plan:   AKI/CKD stage IV - has had  multiple episodes of AKI/CKD with progressively worsening renal function over the past 3 months.  Now with overt uremia and asterixis.  She is willing to proceed with HD on short term trial of 1 week and then decide whether she would want to continue or stop HD.  I feel she will better be able to make decisions, once her uremia is treated.  [place] placed 12/30/21 by IR.  First HD session 12/31/21 and again on 10/12 and 10/13.  Asterixis markedly improved.  Admits that she would continue with HD if "she had to", however she believes that she will not need it and that "the Reita Cliche will give me a miracle".  Will follow UOP and BUN/Cr over the weekend and re-evaluate for HD on Monday.  UOP had improved, however she had worsening azotemia prior to initiating dialysis.  Diabetic foot ulcer/infection of right leg - evaluated by VVS but arteriogram on hold due to worsening renal function. HTN - stable, hold meds prior to HD. Chronic diastolic CHF - stable Hyponatremia - due to AKI and impaired free water handling.  Continue to follow Anemia of CKD stage IV - low iron.  Will start IV iron with HD and add ESA.    Donetta Potts, MD [facility]

## 2022-01-03 NOTE — Progress Notes (Signed)
PROGRESS NOTE    [name]  IWL:[id number] DOB: 03-06-1966 DOA: 12/17/2021 PCP: Jacklynn Ganong, MD   Brief Narrative: [name] is a 56 y.o. female with a history of CAD s/p CABG and multiple stents, chronic heart failure, chronic LE wounds, CKD stage IV, diabetes mellitus, OSA. Patient presented from [facility] ED secondary to right foot ulcer with concern for infection. Empiric antibiotics initiated. Podiatry and vascular surgery consulted, recommending local wound care. Hospitalization complicated by development of rash, likely related to antibiotics in addition to worsening AKI leading to initiation of hemodialysis.   Assessment and Plan:  Diabetic foot infection Right plantar surface wound. Patient with purulent drainage from wound. Patient follows with wound care as an outpatient. Recent x-ray and MRI imaging without evidence of osteomyelitis but with evidence of fluid collection concerning for phlegmon vs abscess. Patient is stable, without fevers or leukocytosis however with elevated ESR and CRP of 78 and 15 respectively. Patient started empirically on Vancomycin at outside hospital and transitioned to Ceftriaxone and Flagyl on admission. Recent ABIs with non-compressible  arteries. Complicated by severe CKD. Superficial culture obtained on 9/21 is significant for Citrobacter koseri and is pansensitive. Patient developed fever on 9/29. Repeat MRI (9/30) without evidence of osteomyelitis or abscess. Ceftriaxone transitioned to aztreonam secondary to rash. Vancomycin and Flagyl discontinued for persistent rash. Blood cultures with no growth. Antibiotics discontinued.  AKI on CKD stage IV Baseline creatinine of about 2.8-3. Creatinine of 3.51 on day prior to admission. Torsemide held. BUN/creatinine worsened to a peak of 161/4.89 respectively. TDC placed on 10/10 by interventional radiology and patient underwent hemodialysis on 10/10 with plan for continued HD this week and to then  decide on if long term HD will be needed/agreed to by patient. UOP of 600 mL with one unmeasured occurrence over the last 24 hours. -Nephrology recommendations: pending today  Hyponatremia Secondary to AKI and impaired free water handling per nephrology. Now resolved.  Fever Uncertain etiology. Presumed secondary to foot infection. Blood cultures obtained and are no growth. Fever resolved.  Diabetes mellitus type 2 Patient is on long term insulin. Uncontrolled with hyperglycemia. Hemoglobin A1C of 6.9% from 10/2021. Patient is prescribed Lantus 35 units BID and Humalog 5 units TID with meals in addition to a sliding scale as an outpatient. Patient started on Semglee 30 units qHS and SSI during this admission -Continue Semglee 28 units BID and continue SSI  Chronic diastolic heart failure Stable. Torsemide held secondary to AKI  Elevated LFTs AST/ALT of 364/138 respectively and trended down. Total bilirubin of 1.2. RUQ with cholelithiasis without cholecystitis and borderline CBD dilation; no associated symptoms. Resolved.  CAD Patient is currently stable. She is on Effient, Ranexa, aspirin and Imdur as an outpatient.  OSA -Continue CPAP  Chronic anemia Baseline hemoglobin of 7-8. No evidence of acute bleeding. In setting of chronic kidney disease. Stable.  Rash Maculopapular rash on arms and upper legs. Presumed secondary to antibiotics. Rash on right upper thigh appears to be becoming more confluent resembling purpura associated tenderness. Patient treated with Solu-medrol and antibiotics discontinued with resolution of rash.   DVT prophylaxis: SCDs Code Status:   Code Status: Full Code Family Communication: None at bedside Disposition Plan: Discharge pending transition to outpatient antibiotic regimen and nephrology recommendations.   Consultants:  Podiatry Vascular surgery Nephrology  Procedures:   None  Antimicrobials: Ceftriaxone Flagyl Aztreonam Vancomycin   Subjective: Patient without issues overnight. Hoping to go home soon. Urinating. Elevated temperature without fever overnight.  Objective:  BP (!) 119/58   Pulse 76   Temp 100 F (37.8 C) (Oral)   Resp 18   Ht _0  (1.702 m)   Wt (!) 143.8 kg   SpO2 99%   BMI 49.65 kg/m   Examination:  General exam: Appears calm and comfortable. Sitting up on the side of the bed eating breakfast Respiratory system: Clear to auscultation. Respiratory effort normal. Cardiovascular system: S1 & S2 heard, RRR. Gastrointestinal system: Abdomen is nondistended, soft and nontender. No organomegaly or masses felt. Normal bowel sounds heard. Central nervous system: Alert and oriented. Musculoskeletal: No edema. No calf tenderness Psychiatry: Judgement and insight appear normal. Mood & affect appropriate.     Data Reviewed: I have personally reviewed following labs and imaging studies  CBC Lab Results  Component Value Date   WBC 8.0 01/03/2022   RBC 3.06 (L) 01/03/2022   HGB 8.4 (L) 01/03/2022   HCT 26.1 (L) 01/03/2022   MCV 85.3 01/03/2022   MCH 27.5 01/03/2022   PLT 130 (L) 01/03/2022   MCHC 32.2 01/03/2022   RDW 16.3 (H) 01/03/2022   LYMPHSABS 2.4 02/24/2010   MONOABS 0.6 02/24/2010   EOSABS 0.1 02/24/2010   BASOSABS 0.0 28/00/3491     Last metabolic panel Lab Results  Component Value Date   NA 136 01/03/2022   K 4.1 01/03/2022   CL 103 01/03/2022   CO2 26 01/03/2022   BUN 36 (H) 01/03/2022   CREATININE 1.76 (H) 01/03/2022   GLUCOSE 234 (H) 01/03/2022   GFRNONAA 33 (L) 01/03/2022   GFRAA 33 (L) 06/15/2019   CALCIUM 8.2 (L) 01/03/2022   PHOS 1.6 (L) 01/03/2022   PROT 6.9 12/30/2021   ALBUMIN 2.1 (L) 01/03/2022   LABGLOB 3.7 10/30/2021   AGRATIO 0.8 10/30/2021   BILITOT 0.9 12/30/2021   ALKPHOS 30 (L) 12/30/2021   AST 27 12/30/2021   ALT 21 12/30/2021   ANIONGAP 7 01/03/2022    GFR: Estimated  Creatinine Clearance: 52.6 mL/min (A) (by C-G formula based on SCr of 1.76 mg/dL (H)).  No results found for this or any previous visit (from the past 240 hour(s)).     Radiology Studies: No results found.    LOS: 17 days    Cordelia Poche, MD [facility] 01/03/2022, 9:35 AM   If 7PM-7AM, please contact night-coverage [email or web address]

## 2022-01-03 NOTE — Plan of Care (Signed)
  Problem: Education: Goal: Knowledge of General Education information will improve Description: Including pain rating scale, medication(s)/side effects and non-pharmacologic comfort measures Outcome: Progressing   Problem: Health Behavior/Discharge Planning: Goal: Ability to manage health-related needs will improve Outcome: Progressing   Problem: Clinical Measurements: Goal: Ability to maintain clinical measurements within normal limits will improve Outcome: Progressing Goal: Will remain free from infection Outcome: Progressing Goal: Diagnostic test results will improve Outcome: Progressing Goal: Respiratory complications will improve Outcome: Progressing Goal: Cardiovascular complication will be avoided Outcome: Progressing   Problem: Activity: Goal: Risk for activity intolerance will decrease Outcome: Progressing   Problem: Coping: Goal: Level of anxiety will decrease Outcome: Progressing   Problem: Elimination: Goal: Will not experience complications related to bowel motility Outcome: Progressing Goal: Will not experience complications related to urinary retention Outcome: Progressing   Problem: Pain Managment: Goal: General experience of comfort will improve Outcome: Progressing   Problem: Safety: Goal: Ability to remain free from injury will improve Outcome: Progressing   Problem: Skin Integrity: Goal: Risk for impaired skin integrity will decrease Outcome: Progressing   Problem: Education: Goal: Ability to describe self-care measures that may prevent or decrease complications (Diabetes Survival Skills Education) will improve Outcome: Progressing Goal: Individualized Educational Video(s) Outcome: Progressing   Problem: Coping: Goal: Ability to adjust to condition or change in health will improve Outcome: Progressing   Problem: Fluid Volume: Goal: Ability to maintain a balanced intake and output will improve Outcome: Progressing   Problem: Health  Behavior/Discharge Planning: Goal: Ability to identify and utilize available resources and services will improve Outcome: Progressing Goal: Ability to manage health-related needs will improve Outcome: Progressing   Problem: Metabolic: Goal: Ability to maintain appropriate glucose levels will improve Outcome: Progressing   Problem: Nutritional: Goal: Maintenance of adequate nutrition will improve Outcome: Progressing Goal: Progress toward achieving an optimal weight will improve Outcome: Progressing   Problem: Skin Integrity: Goal: Risk for impaired skin integrity will decrease Outcome: Progressing   Problem: Tissue Perfusion: Goal: Adequacy of tissue perfusion will improve Outcome: Progressing   

## 2022-01-04 DIAGNOSIS — N179 Acute kidney failure, unspecified: Secondary | ICD-10-CM | POA: Diagnosis not present

## 2022-01-04 DIAGNOSIS — D638 Anemia in other chronic diseases classified elsewhere: Secondary | ICD-10-CM | POA: Diagnosis not present

## 2022-01-04 DIAGNOSIS — E11628 Type 2 diabetes mellitus with other skin complications: Secondary | ICD-10-CM | POA: Diagnosis not present

## 2022-01-04 DIAGNOSIS — I5032 Chronic diastolic (congestive) heart failure: Secondary | ICD-10-CM | POA: Diagnosis not present

## 2022-01-04 LAB — GLUCOSE, CAPILLARY
Glucose-Capillary: 120 mg/dL — ABNORMAL HIGH (ref 70–99)
Glucose-Capillary: 189 mg/dL — ABNORMAL HIGH (ref 70–99)
Glucose-Capillary: 196 mg/dL — ABNORMAL HIGH (ref 70–99)
Glucose-Capillary: 191 mg/dL — ABNORMAL HIGH (ref 70–99)

## 2022-01-04 LAB — RENAL FUNCTION PANEL
Albumin: 2 g/dL — ABNORMAL LOW (ref 3.5–5.0)
Anion gap: 8 (ref 5–15)
BUN: 40 mg/dL — ABNORMAL HIGH (ref 6–20)
CO2: 27 mmol/L (ref 22–32)
Calcium: 8.5 mg/dL — ABNORMAL LOW (ref 8.9–10.3)
Chloride: 102 mmol/L (ref 98–111)
Creatinine, Ser: 2.25 mg/dL — ABNORMAL HIGH (ref 0.44–1.00)
GFR, Estimated: 25 mL/min — ABNORMAL LOW (ref >60)
Glucose, Bld: 152 mg/dL — ABNORMAL HIGH (ref 70–99)
Phosphorus: 2.5 mg/dL (ref 2.5–4.6)
Potassium: 4 mmol/L (ref 3.5–5.1)
Sodium: 137 mmol/L (ref 135–145)

## 2022-01-04 NOTE — Progress Notes (Signed)
Mobility Specialist - Progress Note   01/04/22 1245  Mobility  Activity Transferred from chair to bed  Level of Assistance Standby assist, set-up cues, supervision of patient - no hands on  Assistive Device None  Distance Ambulated (ft) 5 ft  RLE Weight Bearing WBAT  Activity Response Tolerated well  $Mobility charge 1 Mobility    Pt transferred back to bed from recliner. Left in bed w/ call bell in reach and all needs met.   Paulla Dolly Mobility Specialist

## 2022-01-04 NOTE — Progress Notes (Signed)
Patient ID: [name], female   DOB: 01/24/1966, 56 y.o.   MRN: [id number] S:No new complaints O:BP 106/71   Pulse 94   Temp 99.5 F (37.5 C) (Oral)   Resp 18   Ht '5\' 7"'$  (1.702 m)   Wt (!) 147 kg   SpO2 97%   BMI 50.75 kg/m   Intake/Output Summary (Last 24 hours) at 01/04/2022 1224 Last data filed at 01/04/2022 0905 Gross per 24 hour  Intake 836 ml  Output 150 ml  Net 686 ml   Intake/Output: I/O last 3 completed shifts: In: 720 [P.O.:720] Out: 750 [Urine:750]  Intake/Output this shift:  Total I/O In: 236 [P.O.:236] Out: -  Weight change: 1.361 kg Gen: NAD CVS: RRR Resp:CTA Abd: +BS, soft,NT/nD CBU:LAGTXM edema with chronic venous stasis changes bilaterally  Recent Labs  Lab 12/30/21 0148 12/30/21 1829 12/31/21 0429 01/01/22 0032 01/02/22 0119 01/03/22 0034 01/04/22 0101  NA 131* 138 137 138 140 136 137  K 3.7 3.8 3.2* 3.5 3.4* 4.1 4.0  CL 98 105 102 105 103 103 102  CO2 20* '22 24 25 27 26 27  '$ GLUCOSE 248* 180* 165* 198* 186* 234* 152*  BUN 155* 150* 111* 109* 62* 36* 40*  CREATININE 4.16* 3.72* 2.93* 2.88* 1.99* 1.76* 2.25*  ALBUMIN 2.0* 2.0*  2.0* 1.8* 1.8* 1.8* 2.1* 2.0*  CALCIUM 8.4* 8.8* 8.4* 8.6* 8.3* 8.2* 8.5*  PHOS 4.8* 4.5 3.2 3.2 2.5 1.6* 2.5  AST  --  27  --   --   --   --   --   ALT  --  21  --   --   --   --   --    Liver Function Tests: Recent Labs  Lab 12/30/21 1829 12/31/21 0429 01/02/22 0119 01/03/22 0034 01/04/22 0101  AST 27  --   --   --   --   ALT 21  --   --   --   --   ALKPHOS 30*  --   --   --   --   BILITOT 0.9  --   --   --   --   PROT 6.9  --   --   --   --   ALBUMIN 2.0*  2.0*   < > 1.8* 2.1* 2.0*   < > = values in this interval not displayed.   No results for input(s): "LIPASE", "AMYLASE" in the last 168 hours. No results for input(s): "AMMONIA" in the last 168 hours. CBC: Recent Labs  Lab 12/30/21 1829 12/31/21 0429 01/01/22 0032 01/03/22 0034 01/03/22 0959  WBC 7.4 7.4 7.2 8.0 8.1  HGB 7.6* 7.3* 7.4*  8.4* 8.0*  HCT 23.0* 22.3* 22.8* 26.1* 26.1*  MCV 82.4 81.7 83.5 85.3 86.1  PLT 135* 133* 124* 130* 141*   Cardiac Enzymes: No results for input(s): "CKTOTAL", "CKMB", "CKMBINDEX", "TROPONINI" in the last 168 hours. CBG: Recent Labs  Lab 01/03/22 1117 01/03/22 1629 01/03/22 2100 01/04/22 0603 01/04/22 1146  GLUCAP 205* 167* 151* 120* 189*    Iron Studies: No results for input(s): "IRON", "TIBC", "TRANSFERRIN", "FERRITIN" in the last 72 hours. Studies/Results: No results found.  (feeding supplement) PROSource Plus  30 mL Oral TID BM   carvedilol  12.5 mg Oral BID WC   Chlorhexidine Gluconate Cloth  6 each Topical Q0600   darbepoetin (ARANESP) injection - NON-DIALYSIS  60 mcg Subcutaneous Q Sat-1800   gabapentin  100 mg Oral QHS   hydrocerin   Topical TID  insulin aspart  0-15 Units Subcutaneous TID WC   insulin aspart  0-5 Units Subcutaneous QHS   insulin glargine-yfgn  28 Units Subcutaneous BID   isosorbide mononitrate  60 mg Oral q morning   rosuvastatin  5 mg Oral Daily   torsemide  40 mg Oral BID   triamcinolone cream   Topical BID    BMET    Component Value Date/Time   NA 137 01/04/2022 0101   K 4.0 01/04/2022 0101   CL 102 01/04/2022 0101   CO2 27 01/04/2022 0101   GLUCOSE 152 (H) 01/04/2022 0101   BUN 40 (H) 01/04/2022 0101   CREATININE 2.25 (H) 01/04/2022 0101   CALCIUM 8.5 (L) 01/04/2022 0101   GFRNONAA 25 (L) 01/04/2022 0101   GFRAA 33 (L) 06/15/2019 1237   CBC    Component Value Date/Time   WBC 8.1 01/03/2022 0959   RBC 3.03 (L) 01/03/2022 0959   HGB 8.0 (L) 01/03/2022 0959   HCT 26.1 (L) 01/03/2022 0959   PLT 141 (L) 01/03/2022 0959   MCV 86.1 01/03/2022 0959   MCH 26.4 01/03/2022 0959   MCHC 30.7 01/03/2022 0959   RDW 16.2 (H) 01/03/2022 0959   LYMPHSABS 2.4 02/24/2010 1320   MONOABS 0.6 02/24/2010 1320   EOSABS 0.1 02/24/2010 1320   BASOSABS 0.0 02/24/2010 1320    Assessment/Plan:   AKI/CKD stage IV - has had multiple episodes of  AKI/CKD with progressively worsening renal function over the past 3 months.  Baseline had been 2.2-2.4 prior to hospitalization at [facility] but then rose to 3.4 on 12/16/21 prior to this hospitalization. She developed overt uremia with asterixis but had declined HD.  She decided to proceed with HD on short term trial of 1 week and then decide whether she would want to continue or stop HD.  Dare placed 12/30/21 by IR.  First HD session 12/31/21 and again on 10/12 and 10/13 and again on 01/03/22.  Asterixis markedly improved.  Admits that she would continue with HD if "she had to", however she believes that she will not need it and that "the Reita Cliche will give me a miracle".  Will follow UOP and BUN/Cr over the weekend and re-evaluate for HD on Monday.  Diabetic foot ulcer/infection of right leg - evaluated by [facility] but arteriogram on hold due to worsening renal function. HTN - stable, hold meds prior to HD. Chronic diastolic CHF - stable Hyponatremia - due to AKI and impaired free water handling.  Continue to follow Anemia of CKD stage IV - low iron.  Will start IV iron with HD and add ESA.  Donetta Potts, MD [facility]

## 2022-01-04 NOTE — Progress Notes (Signed)
PROGRESS NOTE    [name]  KXF:[id number] DOB: 04-24-1965 DOA: 12/17/2021 PCP: Jacklynn Ganong, MD   Brief Narrative: [name] is a 56 y.o. female with a history of CAD s/p CABG and multiple stents, chronic heart failure, chronic LE wounds, CKD stage IV, diabetes mellitus, OSA. Patient presented from [facility] ED secondary to right foot ulcer with concern for infection. Empiric antibiotics initiated. Podiatry and vascular surgery consulted, recommending local wound care. Hospitalization complicated by development of rash, likely related to antibiotics in addition to worsening AKI leading to initiation of hemodialysis.   Assessment and Plan:  Diabetic foot infection Right plantar surface wound. Patient with purulent drainage from wound. Patient follows with wound care as an outpatient. Recent x-ray and MRI imaging without evidence of osteomyelitis but with evidence of fluid collection concerning for phlegmon vs abscess. Patient is stable, without fevers or leukocytosis however with elevated ESR and CRP of 78 and 15 respectively. Patient started empirically on Vancomycin at outside hospital and transitioned to Ceftriaxone and Flagyl on admission. Recent ABIs with non-compressible  arteries. Complicated by severe CKD. Superficial culture obtained on 9/21 is significant for Citrobacter koseri and is pansensitive. Patient developed fever on 9/29. Repeat MRI (9/30) without evidence of osteomyelitis or abscess. Ceftriaxone transitioned to aztreonam secondary to rash. Vancomycin and Flagyl discontinued for persistent rash. Blood cultures with no growth. Antibiotics discontinued.  AKI on CKD stage IV Baseline creatinine of about 2.8-3. Creatinine of 3.51 on day prior to admission. Torsemide held. BUN/creatinine worsened to a peak of 161/4.89 respectively. TDC placed on 10/10 by interventional radiology and patient underwent hemodialysis on 10/10 with plan for continued HD this week and to then  decide on if long term HD will be needed/agreed to by patient. UOP of 600 mL with one unmeasured occurrence over the last 24 hours. -Nephrology recommendations: pending today  Hyponatremia Secondary to AKI and impaired free water handling per nephrology. Now resolved.  Fever Uncertain etiology. Presumed secondary to foot infection. Blood cultures obtained and are no growth. Fever resolved.  Diabetes mellitus type 2 Patient is on long term insulin. Uncontrolled with hyperglycemia. Hemoglobin A1C of 6.9% from 10/2021. Patient is prescribed Lantus 35 units BID and Humalog 5 units TID with meals in addition to a sliding scale as an outpatient. Patient started on Semglee 30 units qHS and SSI during this admission -Continue Semglee 28 units BID and continue SSI  Chronic diastolic heart failure Stable. Torsemide held secondary to AKI  Elevated LFTs AST/ALT of 364/138 respectively and trended down. Total bilirubin of 1.2. RUQ with cholelithiasis without cholecystitis and borderline CBD dilation; no associated symptoms. Resolved.  CAD Patient is currently stable. She is on Effient, Ranexa, aspirin and Imdur as an outpatient.  OSA -Continue CPAP  Chronic anemia Baseline hemoglobin of 7-8. No evidence of acute bleeding. In setting of chronic kidney disease. Stable.  Rash Maculopapular rash on arms and upper legs. Presumed secondary to antibiotics. Rash on right upper thigh appears to be becoming more confluent resembling purpura associated tenderness. Patient treated with Solu-medrol and antibiotics discontinued with resolution of rash.   DVT prophylaxis: SCDs Code Status:   Code Status: Full Code Family Communication: None at bedside Disposition Plan: Discharge pending nephrology recommendations for possible outpatient HD need   Consultants:  Podiatry Vascular surgery Nephrology  Procedures:   Hemodialysis  Antimicrobials: Ceftriaxone Flagyl Aztreonam Vancomycin   Subjective: Patient reports no specific issues overnight. Ready to go home, though.  Objective: BP 106/71   Pulse  94   Temp 99.5 F (37.5 C) (Oral)   Resp 18   Ht 5' 7"  (1.702 m)   Wt (!) 147 kg   SpO2 97%   BMI 50.75 kg/m   Examination:  General exam: Appears calm and comfortable Respiratory system: Clear to auscultation. Respiratory effort normal. Cardiovascular system: S1 & S2 heard, RRR. Gastrointestinal system: Abdomen is nondistended, soft and nontender. Normal bowel sounds heard. Central nervous system: Alert and oriented. No focal neurological deficits. Musculoskeletal: No calf tenderness Psychiatry: Judgement and insight appear normal. Mood & affect appropriate.    Data Reviewed: I have personally reviewed following labs and imaging studies  CBC Lab Results  Component Value Date   WBC 8.1 01/03/2022   RBC 3.03 (L) 01/03/2022   HGB 8.0 (L) 01/03/2022   HCT 26.1 (L) 01/03/2022   MCV 86.1 01/03/2022   MCH 26.4 01/03/2022   PLT 141 (L) 01/03/2022   MCHC 30.7 01/03/2022   RDW 16.2 (H) 01/03/2022   LYMPHSABS 2.4 02/24/2010   MONOABS 0.6 02/24/2010   EOSABS 0.1 02/24/2010   BASOSABS 0.0 91/69/4503     Last metabolic panel Lab Results  Component Value Date   NA 137 01/04/2022   K 4.0 01/04/2022   CL 102 01/04/2022   CO2 27 01/04/2022   BUN 40 (H) 01/04/2022   CREATININE 2.25 (H) 01/04/2022   GLUCOSE 152 (H) 01/04/2022   GFRNONAA 25 (L) 01/04/2022   GFRAA 33 (L) 06/15/2019   CALCIUM 8.5 (L) 01/04/2022   PHOS 2.5 01/04/2022   PROT 6.9 12/30/2021   ALBUMIN 2.0 (L) 01/04/2022   LABGLOB 3.7 10/30/2021   AGRATIO 0.8 10/30/2021   BILITOT 0.9 12/30/2021   ALKPHOS 30 (L) 12/30/2021   AST 27 12/30/2021   ALT 21 12/30/2021   ANIONGAP 8 01/04/2022    GFR: Estimated Creatinine Clearance: 41.7 mL/min (A) (by C-G formula based on SCr of 2.25 mg/dL (H)).  No results found for  this or any previous visit (from the past 240 hour(s)).     Radiology Studies: No results found.    LOS: 18 days    Cordelia Poche, MD [facility] 01/04/2022, 12:48 PM   If 7PM-7AM, please contact night-coverage [email or web address]

## 2022-01-04 NOTE — Progress Notes (Signed)
Mobility Specialist - Progress Note   01/04/22 1144  Mobility  Activity Transferred from bed to chair  Level of Assistance Standby assist, set-up cues, supervision of patient - no hands on  Assistive Device None  Distance Ambulated (ft) 5 ft  RLE Weight Bearing WBAT  Activity Response Tolerated well  $Mobility charge 1 Mobility    Pt received in bed requesting to transfer to recliner. Left in recliner w/ call bell and all needs met.   Paulla Dolly Mobility Specialist

## 2022-01-05 DIAGNOSIS — E11628 Type 2 diabetes mellitus with other skin complications: Secondary | ICD-10-CM | POA: Diagnosis not present

## 2022-01-05 DIAGNOSIS — D638 Anemia in other chronic diseases classified elsewhere: Secondary | ICD-10-CM | POA: Diagnosis not present

## 2022-01-05 DIAGNOSIS — R531 Weakness: Secondary | ICD-10-CM | POA: Diagnosis not present

## 2022-01-05 DIAGNOSIS — Z515 Encounter for palliative care: Secondary | ICD-10-CM | POA: Diagnosis not present

## 2022-01-05 DIAGNOSIS — I5032 Chronic diastolic (congestive) heart failure: Secondary | ICD-10-CM | POA: Diagnosis not present

## 2022-01-05 DIAGNOSIS — I120 Hypertensive chronic kidney disease with stage 5 chronic kidney disease or end stage renal disease: Secondary | ICD-10-CM | POA: Diagnosis not present

## 2022-01-05 DIAGNOSIS — N185 Chronic kidney disease, stage 5: Secondary | ICD-10-CM | POA: Diagnosis not present

## 2022-01-05 DIAGNOSIS — N179 Acute kidney failure, unspecified: Secondary | ICD-10-CM | POA: Diagnosis not present

## 2022-01-05 LAB — GLUCOSE, CAPILLARY
Glucose-Capillary: 128 mg/dL — ABNORMAL HIGH (ref 70–99)
Glucose-Capillary: 152 mg/dL — ABNORMAL HIGH (ref 70–99)
Glucose-Capillary: 149 mg/dL — ABNORMAL HIGH (ref 70–99)
Glucose-Capillary: 177 mg/dL — ABNORMAL HIGH (ref 70–99)

## 2022-01-05 LAB — RENAL FUNCTION PANEL
Albumin: 2.1 g/dL — ABNORMAL LOW (ref 3.5–5.0)
Anion gap: 9 (ref 5–15)
BUN: 43 mg/dL — ABNORMAL HIGH (ref 6–20)
CO2: 26 mmol/L (ref 22–32)
Calcium: 8.4 mg/dL — ABNORMAL LOW (ref 8.9–10.3)
Chloride: 99 mmol/L (ref 98–111)
Creatinine, Ser: 2.43 mg/dL — ABNORMAL HIGH (ref 0.44–1.00)
GFR, Estimated: 23 mL/min — ABNORMAL LOW (ref >60)
Glucose, Bld: 160 mg/dL — ABNORMAL HIGH (ref 70–99)
Phosphorus: 2.4 mg/dL — ABNORMAL LOW (ref 2.5–4.6)
Potassium: 3.5 mmol/L (ref 3.5–5.1)
Sodium: 134 mmol/L — ABNORMAL LOW (ref 135–145)

## 2022-01-05 LAB — CBC
HCT: 24.6 % — ABNORMAL LOW (ref 36.0–46.0)
Hemoglobin: 7.6 g/dL — ABNORMAL LOW (ref 12.0–15.0)
MCH: 26.6 pg (ref 26.0–34.0)
MCHC: 30.9 g/dL (ref 30.0–36.0)
MCV: 86 fL (ref 80.0–100.0)
Platelets: 123 10*3/uL — ABNORMAL LOW (ref 150–400)
RBC: 2.86 MIL/uL — ABNORMAL LOW (ref 3.87–5.11)
RDW: 16.4 % — ABNORMAL HIGH (ref 11.5–15.5)
WBC: 6.3 10*3/uL (ref 4.0–10.5)
nRBC: 0.9 % — ABNORMAL HIGH (ref 0.0–0.2)

## 2022-01-05 MED ORDER — CHLORHEXIDINE GLUCONATE CLOTH 2 % EX PADS
6.0000 | MEDICATED_PAD | Freq: Every day | CUTANEOUS | Status: DC
  Administered 2022-01-05 – 2022-01-06 (×2): 6 via TOPICAL

## 2022-01-05 NOTE — Progress Notes (Signed)
Patient ID: [name], female   DOB: May 19, 1965, 56 y.o.   MRN: [id number]    Progress Note from the Palliative Medicine Team at [facility]   Patient Name: [name]        Date: 01/05/2022 DOB: May 17, 1965  Age: 56 y.o. MRN#: [id number] Attending Physician: Mariel Aloe, MD Primary Care Physician: Jacklynn Ganong, MD Admit Date: 12/17/2021   Medical records reviewed, assessed patient   56 y.o. female with a history of CAD s/p CABG, CKD 4, chronic HFpEF, type II DM, OSA.  Presented with right foot ulcer, AKI.  Vascular surgery, podiatry and nephrology following. Currently no plans for surgery.  Cultures are negative. Has progressive AKI on CKD 4 with multiple uremic symptoms, progressed to HD  Initial palliative consult completed on 12/27/2021   PMT will continue to support and help patient navigate treatment option decisions, advanced directive decisions and anticipatory care needs moving forward through this hospitalization.  I sat at the bedside with [name] this morning she has successfully  completed and HD treatment.   Created space and opportunity for patient to explore thoughts and feelings regarding her current medical situation.  She continues to process her decision to move forward with hemodialysis.  She recognizes that her cognitive state "is clearer" today.  [name] opened up a bit more with me today .  We were able to talk about the seriousness of her multiple co-morbilities; CHF, ESRD, decreased mobility and wounds.    I expressed my concern around anticipatory care needs, she too acknowledges.      Offered and encouraged a family meeting, declined at this time.   Offered assistance with HPOA and AD completion declined at this time.  Patient will need ongoing education and assistance as she attempts to put a discharge plan into place.  I believe that she will consider SNF for short term rehab if eligible.  Ultimately she home to maintain her  independence.  Education on the importance of self care and responsibility in treatment plan. Demonstration on simple AROM exercises  Emotional support offered, this is a very difficult time for [name].  Questions and concerns addressed   Discussed with Dr Posey Pronto   This nurse practitioner informed  the patient and the attending that I will be out of the hospital until Sunday morning.  If the patient is still hospitalized I will follow-up at that time.  Call palliative medicine team phone # [phone] with questions or concerns.    Wadie Lessen NP  Palliative Medicine Team Team Phone # [phone] Pager [phone]

## 2022-01-05 NOTE — Progress Notes (Signed)
PROGRESS NOTE    [name]  EFE:[id number] DOB: 06/23/1965 DOA: 12/17/2021 PCP: Jacklynn Ganong, MD   Brief Narrative: [name] is a 56 y.o. female with a history of CAD s/p CABG and multiple stents, chronic heart failure, chronic LE wounds, CKD stage IV, diabetes mellitus, OSA. Patient presented from [facility] ED secondary to right foot ulcer with concern for infection. Empiric antibiotics initiated. Podiatry and vascular surgery consulted, recommending local wound care. Hospitalization complicated by development of rash, likely related to antibiotics in addition to worsening AKI leading to initiation of hemodialysis.   Assessment and Plan:  Diabetic foot infection Right plantar surface wound. Patient with purulent drainage from wound. Patient follows with wound care as an outpatient. Recent x-ray and MRI imaging without evidence of osteomyelitis but with evidence of fluid collection concerning for phlegmon vs abscess. Patient is stable, without fevers or leukocytosis however with elevated ESR and CRP of 78 and 15 respectively. Patient started empirically on Vancomycin at outside hospital and transitioned to Ceftriaxone and Flagyl on admission. Recent ABIs with non-compressible  arteries. Complicated by severe CKD. Superficial culture obtained on 9/21 is significant for Citrobacter koseri and is pansensitive. Patient developed fever on 9/29. Repeat MRI (9/30) without evidence of osteomyelitis or abscess. Ceftriaxone transitioned to aztreonam secondary to rash. Vancomycin and Flagyl discontinued for persistent rash. Blood cultures with no growth. Antibiotics discontinued.  AKI on CKD stage IV Baseline creatinine of about 2.8-3. Creatinine of 3.51 on day prior to admission. Torsemide held. BUN/creatinine worsened to a peak of 161/4.89 respectively. TDC placed on 10/10 by interventional radiology and patient underwent hemodialysis on 10/10 with plan for continued HD this week and to then  decide on if long term HD will be needed/agreed to by patient. UOP of 1,100  mL over the last 24 hours. -Nephrology recommendations: Possibly HD cath removal on 10/17 if indication for no HD remains -RFP in AM  Hyponatremia Secondary to AKI and impaired free water handling per nephrology. Now resolved.  Fever Uncertain etiology. Presumed secondary to foot infection. Blood cultures obtained and are no growth. Fever resolved.  Diabetes mellitus type 2 Patient is on long term insulin. Uncontrolled with hyperglycemia. Hemoglobin A1C of 6.9% from 10/2021. Patient is prescribed Lantus 35 units BID and Humalog 5 units TID with meals in addition to a sliding scale as an outpatient. Patient started on Semglee 30 units qHS and SSI during this admission -Continue Semglee 28 units BID and continue SSI  Chronic diastolic heart failure Stable. Torsemide held secondary to AKI  Elevated LFTs AST/ALT of 364/138 respectively and trended down. Total bilirubin of 1.2. RUQ with cholelithiasis without cholecystitis and borderline CBD dilation; no associated symptoms. Resolved.  CAD Patient is currently stable. She is on Effient, Ranexa, aspirin and Imdur as an outpatient.  OSA -Continue CPAP  Chronic anemia Baseline hemoglobin of 7-8. No evidence of acute bleeding. In setting of chronic kidney disease. Stable.  Rash Maculopapular rash on arms and upper legs. Presumed secondary to antibiotics. Rash on right upper thigh appears to be becoming more confluent resembling purpura associated tenderness. Patient treated with Solu-medrol and antibiotics discontinued with resolution of rash.   DVT prophylaxis: SCDs Code Status:   Code Status: Full Code Family Communication: None at bedside Disposition Plan: Discharge pending nephrology recommendations for possible outpatient HD need. Possible discharge in 1-3 days.   Consultants:  Podiatry Vascular surgery Nephrology  Procedures:   Hemodialysis  Antimicrobials: Ceftriaxone Flagyl Aztreonam Vancomycin   Subjective: No issues overnight.  Thought that she would be going home today. No other concerns this morning.  Objective: BP 125/62 (BP Location: Right Arm)   Pulse 94   Temp 97.8 F (36.6 C) (Oral)   Resp 18   Ht 5' 7"  (1.702 m)   Wt (!) 148.5 kg   SpO2 98%   BMI 51.26 kg/m   Examination:  General exam: Appears calm and comfortable Respiratory system: Clear to auscultation. Respiratory effort normal. Cardiovascular system: S1 & S2 heard, RRR. Gastrointestinal system: Abdomen is nondistended, soft and nontender. Normal bowel sounds heard. Central nervous system: Alert and oriented. No focal neurological deficits. Musculoskeletal: No calf tenderness Psychiatry: Judgement and insight appear normal. Mood & affect appropriate.    Data Reviewed: I have personally reviewed following labs and imaging studies  CBC Lab Results  Component Value Date   WBC 6.3 01/05/2022   RBC 2.86 (L) 01/05/2022   HGB 7.6 (L) 01/05/2022   HCT 24.6 (L) 01/05/2022   MCV 86.0 01/05/2022   MCH 26.6 01/05/2022   PLT 123 (L) 01/05/2022   MCHC 30.9 01/05/2022   RDW 16.4 (H) 01/05/2022   LYMPHSABS 2.4 02/24/2010   MONOABS 0.6 02/24/2010   EOSABS 0.1 02/24/2010   BASOSABS 0.0 18/55/0158     Last metabolic panel Lab Results  Component Value Date   NA 134 (L) 01/05/2022   K 3.5 01/05/2022   CL 99 01/05/2022   CO2 26 01/05/2022   BUN 43 (H) 01/05/2022   CREATININE 2.43 (H) 01/05/2022   GLUCOSE 160 (H) 01/05/2022   GFRNONAA 23 (L) 01/05/2022   GFRAA 33 (L) 06/15/2019   CALCIUM 8.4 (L) 01/05/2022   PHOS 2.4 (L) 01/05/2022   PROT 6.9 12/30/2021   ALBUMIN 2.1 (L) 01/05/2022   LABGLOB 3.7 10/30/2021   AGRATIO 0.8 10/30/2021   BILITOT 0.9 12/30/2021   ALKPHOS 30 (L) 12/30/2021   AST 27 12/30/2021   ALT 21 12/30/2021   ANIONGAP 9 01/05/2022    GFR: Estimated Creatinine Clearance: 38.9 mL/min (A) (by C-G  formula based on SCr of 2.43 mg/dL (H)).  No results found for this or any previous visit (from the past 240 hour(s)).     Radiology Studies: No results found.    LOS: 19 days    Cordelia Poche, MD [facility] 01/05/2022, 5:10 PM   If 7PM-7AM, please contact night-coverage [email or web address]

## 2022-01-05 NOTE — Progress Notes (Signed)
Patient ID: [name], female   DOB: 19-Mar-1966, 56 y.o.   MRN: [id number] S:No new complaints, very eager to go home today. Per staff, made a lot of urine overnight. 1.1L uop charted in last 24 hrs.  O:BP (!) 97/46 (BP Location: Right Arm)   Pulse 89   Temp 99.7 F (37.6 C) (Oral)   Resp 18   Ht '5\' 7"'$  (1.702 m)   Wt (!) 148.5 kg   SpO2 97%   BMI 51.26 kg/m   Intake/Output Summary (Last 24 hours) at 01/05/2022 1042 Last data filed at 01/05/2022 0047 Gross per 24 hour  Intake 834 ml  Output 1100 ml  Net -266 ml   Intake/Output: I/O last 3 completed shifts: In: 4970 [P.O.:1670] Out: 1250 [Urine:1250]  Intake/Output this shift:  No intake/output data recorded. Weight change: 1.497 kg Gen: NAD CVS: RRR Resp:CTA Abd: +BS, soft,NT/nD YOV:ZCHYIF edema with chronic venous stasis changes bilaterally Neuro: awake, alert, no asterixis Dialysis access: [facility] c/d/i  Recent Labs  Lab 12/30/21 1829 12/31/21 0429 01/01/22 0032 01/02/22 0119 01/03/22 0034 01/04/22 0101 01/05/22 0059  NA 138 137 138 140 136 137 134*  K 3.8 3.2* 3.5 3.4* 4.1 4.0 3.5  CL 105 102 105 103 103 102 99  CO2 '22 24 25 27 26 27 26  '$ GLUCOSE 180* 165* 198* 186* 234* 152* 160*  BUN 150* 111* 109* 62* 36* 40* 43*  CREATININE 3.72* 2.93* 2.88* 1.99* 1.76* 2.25* 2.43*  ALBUMIN 2.0*  2.0* 1.8* 1.8* 1.8* 2.1* 2.0* 2.1*  CALCIUM 8.8* 8.4* 8.6* 8.3* 8.2* 8.5* 8.4*  PHOS 4.5 3.2 3.2 2.5 1.6* 2.5 2.4*  AST 27  --   --   --   --   --   --   ALT 21  --   --   --   --   --   --    Liver Function Tests: Recent Labs  Lab 12/30/21 1829 12/31/21 0429 01/03/22 0034 01/04/22 0101 01/05/22 0059  AST 27  --   --   --   --   ALT 21  --   --   --   --   ALKPHOS 30*  --   --   --   --   BILITOT 0.9  --   --   --   --   PROT 6.9  --   --   --   --   ALBUMIN 2.0*  2.0*   < > 2.1* 2.0* 2.1*   < > = values in this interval not displayed.   No results for input(s): "LIPASE", "AMYLASE" in the last 168 hours. No  results for input(s): "AMMONIA" in the last 168 hours. CBC: Recent Labs  Lab 12/31/21 0429 01/01/22 0032 01/03/22 0034 01/03/22 0959 01/05/22 0059  WBC 7.4 7.2 8.0 8.1 6.3  HGB 7.3* 7.4* 8.4* 8.0* 7.6*  HCT 22.3* 22.8* 26.1* 26.1* 24.6*  MCV 81.7 83.5 85.3 86.1 86.0  PLT 133* 124* 130* 141* 123*   Cardiac Enzymes: No results for input(s): "CKTOTAL", "CKMB", "CKMBINDEX", "TROPONINI" in the last 168 hours. CBG: Recent Labs  Lab 01/04/22 0603 01/04/22 1146 01/04/22 1555 01/04/22 2116 01/05/22 0612  GLUCAP 120* 189* 196* 191* 128*    Iron Studies: No results for input(s): "IRON", "TIBC", "TRANSFERRIN", "FERRITIN" in the last 72 hours. Studies/Results: No results found.  (feeding supplement) PROSource Plus  30 mL Oral TID BM   carvedilol  12.5 mg Oral BID WC   Chlorhexidine Gluconate Cloth  6 each Topical Daily   darbepoetin (ARANESP) injection - NON-DIALYSIS  60 mcg Subcutaneous Q Sat-1800   gabapentin  100 mg Oral QHS   hydrocerin   Topical TID   insulin aspart  0-15 Units Subcutaneous TID WC   insulin aspart  0-5 Units Subcutaneous QHS   insulin glargine-yfgn  28 Units Subcutaneous BID   isosorbide mononitrate  60 mg Oral q morning   rosuvastatin  5 mg Oral Daily   torsemide  40 mg Oral BID   triamcinolone cream   Topical BID    BMET    Component Value Date/Time   NA 134 (L) 01/05/2022 0059   K 3.5 01/05/2022 0059   CL 99 01/05/2022 0059   CO2 26 01/05/2022 0059   GLUCOSE 160 (H) 01/05/2022 0059   BUN 43 (H) 01/05/2022 0059   CREATININE 2.43 (H) 01/05/2022 0059   CALCIUM 8.4 (L) 01/05/2022 0059   GFRNONAA 23 (L) 01/05/2022 0059   GFRAA 33 (L) 06/15/2019 1237   CBC    Component Value Date/Time   WBC 6.3 01/05/2022 0059   RBC 2.86 (L) 01/05/2022 0059   HGB 7.6 (L) 01/05/2022 0059   HCT 24.6 (L) 01/05/2022 0059   PLT 123 (L) 01/05/2022 0059   MCV 86.0 01/05/2022 0059   MCH 26.6 01/05/2022 0059   MCHC 30.9 01/05/2022 0059   RDW 16.4 (H) 01/05/2022 0059    LYMPHSABS 2.4 02/24/2010 1320   MONOABS 0.6 02/24/2010 1320   EOSABS 0.1 02/24/2010 1320   BASOSABS 0.0 02/24/2010 1320    Assessment/Plan:   AKI/CKD stage IV - has had multiple episodes of AKI/CKD with progressively worsening renal function over the past 3 months.  Baseline had been 2.2-2.4 prior to hospitalization at [facility] but then rose to 3.4 on 12/16/21 prior to this hospitalization. She developed overt uremia with asterixis but had declined HD.  She decided to proceed with HD on short term trial of 1 week and then decide whether she would want to continue or stop HD.  Meadow placed 12/30/21 by IR.  First HD session 12/31/21 and again on 10/12 and 10/13 and again on 01/02/22.  Asterixis markedly improved.   Cr relatively stable today, would hold off on HD. Not exhibiting overt uremic symptoms. If we're certain that she will not need HD by tomorrow and if urine output continues to be robust, will have HD catheter removed and will follow as an outpatient. Offered outpatient follow up at our office shortly after d/c however patient wishes to follow up closer to home in [facility] therefore will need an urgent referral or at least labs checked by PCP shortly after d/c Diabetic foot ulcer/infection of right leg - evaluated by [facility] but arteriogram on hold due to worsening renal function. Per primary HTN - stable, hold meds prior to HD. Chronic diastolic CHF - stable Hyponatremia - due to AKI and impaired free water handling.  Continue to follow Anemia of CKD stage IV - low iron.  s/p feraheme '510mg'$  10/14 with HD, on ESA.  Gean Quint, MD [facility]

## 2022-01-06 ENCOUNTER — Inpatient Hospital Stay (HOSPITAL_COMMUNITY): Payer: Medicare Other

## 2022-01-06 DIAGNOSIS — R531 Weakness: Secondary | ICD-10-CM | POA: Diagnosis not present

## 2022-01-06 DIAGNOSIS — E11628 Type 2 diabetes mellitus with other skin complications: Secondary | ICD-10-CM | POA: Diagnosis not present

## 2022-01-06 DIAGNOSIS — N185 Chronic kidney disease, stage 5: Secondary | ICD-10-CM | POA: Diagnosis not present

## 2022-01-06 DIAGNOSIS — Z515 Encounter for palliative care: Secondary | ICD-10-CM | POA: Diagnosis not present

## 2022-01-06 DIAGNOSIS — I120 Hypertensive chronic kidney disease with stage 5 chronic kidney disease or end stage renal disease: Secondary | ICD-10-CM | POA: Diagnosis not present

## 2022-01-06 DIAGNOSIS — D638 Anemia in other chronic diseases classified elsewhere: Secondary | ICD-10-CM | POA: Diagnosis not present

## 2022-01-06 DIAGNOSIS — N179 Acute kidney failure, unspecified: Secondary | ICD-10-CM | POA: Diagnosis not present

## 2022-01-06 DIAGNOSIS — I5032 Chronic diastolic (congestive) heart failure: Secondary | ICD-10-CM | POA: Diagnosis not present

## 2022-01-06 HISTORY — PX: IR REMOVAL TUN CV CATH W/O FL: IMG2289

## 2022-01-06 LAB — CBC
HCT: 22.4 % — ABNORMAL LOW (ref 36.0–46.0)
Hemoglobin: 7.2 g/dL — ABNORMAL LOW (ref 12.0–15.0)
MCH: 27.5 pg (ref 26.0–34.0)
MCHC: 32.1 g/dL (ref 30.0–36.0)
MCV: 85.5 fL (ref 80.0–100.0)
Platelets: 106 10*3/uL — ABNORMAL LOW (ref 150–400)
RBC: 2.62 MIL/uL — ABNORMAL LOW (ref 3.87–5.11)
RDW: 16.8 % — ABNORMAL HIGH (ref 11.5–15.5)
WBC: 5.8 10*3/uL (ref 4.0–10.5)
nRBC: 0.3 % — ABNORMAL HIGH (ref 0.0–0.2)

## 2022-01-06 LAB — RENAL FUNCTION PANEL
Albumin: 2 g/dL — ABNORMAL LOW (ref 3.5–5.0)
Anion gap: 7 (ref 5–15)
BUN: 44 mg/dL — ABNORMAL HIGH (ref 6–20)
CO2: 26 mmol/L (ref 22–32)
Calcium: 8.4 mg/dL — ABNORMAL LOW (ref 8.9–10.3)
Chloride: 100 mmol/L (ref 98–111)
Creatinine, Ser: 2.49 mg/dL — ABNORMAL HIGH (ref 0.44–1.00)
GFR, Estimated: 22 mL/min — ABNORMAL LOW (ref >60)
Glucose, Bld: 142 mg/dL — ABNORMAL HIGH (ref 70–99)
Phosphorus: 2.8 mg/dL (ref 2.5–4.6)
Potassium: 3.7 mmol/L (ref 3.5–5.1)
Sodium: 133 mmol/L — ABNORMAL LOW (ref 135–145)

## 2022-01-06 LAB — GLUCOSE, CAPILLARY
Glucose-Capillary: 164 mg/dL — ABNORMAL HIGH (ref 70–99)
Glucose-Capillary: 216 mg/dL — ABNORMAL HIGH (ref 70–99)

## 2022-01-06 MED ORDER — HYDROXYZINE HCL 25 MG PO TABS
25.0000 mg | ORAL_TABLET | Freq: Once | ORAL | Status: AC
  Administered 2022-01-06: 25 mg via ORAL

## 2022-01-06 MED ORDER — NOVOLOG FLEXPEN 100 UNIT/ML ~~LOC~~ SOPN: 8.0000 [IU] | PEN_INJECTOR | Freq: Three times a day (TID) | SUBCUTANEOUS | Status: DC

## 2022-01-06 MED ORDER — GABAPENTIN 100 MG PO CAPS: 100.0000 mg | ORAL_CAPSULE | Freq: Two times a day (BID) | ORAL | Status: DC

## 2022-01-06 MED ORDER — BASAGLAR KWIKPEN 100 UNIT/ML ~~LOC~~ SOPN: 25.0000 [IU] | PEN_INJECTOR | Freq: Two times a day (BID) | SUBCUTANEOUS | Status: DC

## 2022-01-06 MED ORDER — CHLORHEXIDINE GLUCONATE 4 % EX LIQD
CUTANEOUS | Status: AC
  Filled 2022-01-06: qty 15

## 2022-01-06 MED ORDER — HYDROXYZINE HCL 25 MG PO TABS: 25.0000 mg | ORAL_TABLET | Freq: Three times a day (TID) | ORAL | 0 refills | Status: AC | PRN

## 2022-01-06 NOTE — Discharge Summary (Addendum)
Physician Discharge Summary   Patient: [name] MRN: [id number] DOB: 1965/10/08  Admit date:     12/17/2021  Discharge date: 01/06/22  Discharge Physician: Cordelia Poche, MD   PCP: Jacklynn Ganong, MD   Recommendations at discharge:  PCP and nephrology follow-up Wound care follow-up  Discharge Diagnoses: Principal Problem:   Acute renal failure superimposed on stage 4 chronic kidney disease ([place]) Active Problems:   Hypertension associated with diabetes ([place])   Insulin dependent type 2 diabetes mellitus ([place])   CAD (coronary artery disease)   Chronic diastolic heart failure (HCC)   Obesity, morbid, BMI 50 or higher (HCC)   OSA (obstructive sleep apnea)   Anemia of chronic disease   Diabetic foot ulcer (HCC)   Elevated LFTs   Diabetic infection of right foot (HCC)   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 *  Hospital Course: [name] is a 56 y.o. female with a history of CAD s/p CABG and multiple stents, chronic heart failure, chronic LE wounds, CKD stage IV, diabetes mellitus, OSA. Patient presented from [facility] ED secondary to right foot ulcer with concern for infection. Empiric antibiotics initiated. Podiatry and vascular surgery consulted, recommending local wound care. Hospitalization complicated by development of rash, likely related to antibiotics in addition to worsening AKI leading to initiation of hemodialysis.  Assessment and Plan:  Diabetic foot infection Right plantar surface wound. Patient with purulent drainage from wound. Patient follows with wound care as an outpatient. Recent x-ray and MRI imaging without evidence of osteomyelitis but with evidence of fluid collection concerning for phlegmon vs abscess. Patient is stable, without  fevers or leukocytosis however with elevated ESR and CRP of 78 and 15 respectively. Patient started empirically on Vancomycin at outside hospital and transitioned to Ceftriaxone and Flagyl on admission. Recent ABIs with non-compressible  arteries. Complicated by severe CKD. Superficial culture obtained on 9/21 is significant for Citrobacter koseri and is pansensitive. Patient developed fever on 9/29. Repeat MRI (9/30) without evidence of osteomyelitis or abscess. Ceftriaxone transitioned to aztreonam secondary to rash. Vancomycin and Flagyl discontinued for persistent rash. Blood cultures with no growth. All antibiotics eventually discontinued secondary to persistent rash and duration of treatment.   AKI on CKD stage IV Baseline creatinine of about 2.8-3. Creatinine of 3.51 on day prior to admission. Torsemide held. BUN/creatinine worsened to a peak of 161/4.89 respectively. TDC placed on 10/10 by interventional radiology and patient underwent three sessions of hemodialysis. Urine output improved and BUN/creatinine appears to be stabilizing. Nephrology recommended removal of patient's tunneled dialysis catheter and close out patient follow-up.   Hyponatremia Secondary to AKI and impaired free water handling per nephrology. Now resolved.   Fever Uncertain etiology. Presumed secondary to foot infection. Blood cultures obtained and are no growth. Fever resolved.   Diabetes mellitus type 2 Patient is on long term insulin. Uncontrolled with hyperglycemia. Hemoglobin A1C of 6.9% from 10/2021. Patient is prescribed Lantus 35 units BID and Humalog 5 units TID with meals in addition to a sliding scale as an outpatient in the nursing facility. Patient started on Semglee 30 units qHS and SSI during this admission and reduced to Semglee 28 units BID. Discharge on Semglee 25 units BID and Novolog 8 units TID before meals.   Chronic diastolic heart failure Stable. Torsemide held  secondary to AKI   Elevated  LFTs AST/ALT of 364/138 respectively and trended down. Total bilirubin of 1.2. RUQ with cholelithiasis without cholecystitis and borderline CBD dilation; no associated symptoms. Resolved.   CAD Patient is currently stable. She is on Effient, Ranexa, aspirin and Imdur as an outpatient.   OSA Continue CPAP   Chronic anemia Baseline hemoglobin of 7-8. No evidence of acute bleeding. In setting of chronic kidney disease. Stable.   Rash Maculopapular rash on arms and upper legs. Presumed secondary to antibiotics. Rash on right upper thigh appears to be becoming more confluent resembling purpura associated tenderness. Patient treated with Solu-medrol and antibiotics discontinued with resolution of rash.  Morbid obesity Estimated body mass index is 51.31 kg/m as calculated from the following:   Height as of this encounter: 5' 7"  (1.702 m).   Weight as of this encounter: 148.6 kg.  Pressure injury Right buttock. Unsure if present on admission.   Consultants: Podiatry, Vascular surgery, Nephrology, interventional radiology, palliative care medicine Procedures performed: Hemodialysis  Disposition: Home health Diet recommendation: renal diet/heart healthy/carb modified   DISCHARGE MEDICATION: Allergies as of 01/06/2022       Reactions   Bactrim [sulfamethoxazole-trimethoprim] Other (See Comments)   Skin peeled off   Diphenhydramine-acetaminophen Hives   Benadryl [diphenhydramine] Hives   Cephalosporins Anxiety   Ms Contin [morphine] Other (See Comments)   Severe somnolence   Tylenol Pm Extra [diphenhydramine-apap (sleep)] Hives   Valium [diazepam] Hives   Zocor [simvastatin] Other (See Comments)   Arthralgia    Ceftriaxone Rash   Cipro [ciprofloxacin Hcl] Anxiety   Latex Itching   Levaquin [levofloxacin] Anxiety        Medication List     STOP taking these medications    ampicillin-sulbactam  IVPB Commonly known as: UNASYN   atorvastatin 10 MG tablet Commonly known  as: LIPITOR   calcium carbonate 500 MG chewable tablet Commonly known as: TUMS - dosed in mg elemental calcium   DOTAREM IV   ertapenem  IVPB Commonly known as: INVANZ   FENOFIBRATE PO   icosapent Ethyl 1 g capsule Commonly known as: VASCEPA   insulin lispro 100 UNIT/ML injection Commonly known as: HUMALOG   LORazepam 0.5 MG tablet Commonly known as: ATIVAN   melatonin 3 MG Tabs tablet   ondansetron 4 MG disintegrating tablet Commonly known as: ZOFRAN-ODT   ONDANSETRON HCL IV   pantoprazole 40 MG tablet Commonly known as: PROTONIX   Vancomycin HCl in NaCl 750-0.9 MG/250ML-% Soln       TAKE these medications    albuterol 108 (90 Base) MCG/ACT inhaler Commonly known as: VENTOLIN HFA Inhale 2 puffs into the lungs every 6 (six) hours as needed for wheezing or shortness of breath.   aspirin 81 MG chewable tablet Chew 81 mg by mouth daily.   Basaglar KwikPen 100 UNIT/ML Inject 25 Units into the skin 2 (two) times daily. What changed:  how much to take Another medication with the same name was removed. Continue taking this medication, and follow the directions you see here.   carvedilol 25 MG tablet Commonly known as: COREG Take 0.5 tablets (12.5 mg total) by mouth 2 (two) times daily with a meal. What changed:  how much to take when to take this   citalopram 20 MG tablet Commonly known as: CELEXA Take 20 mg by mouth at bedtime.   CoQ10 100 MG Caps Take 100 mg by mouth daily.   cyclobenzaprine 10 MG tablet Commonly known as: FLEXERIL Take 10  mg by mouth 3 (three) times daily as needed for muscle spasms.   Effient 10 MG Tabs tablet Generic drug: prasugrel TAKE 1 TABLET BY MOUTH DAILY   Febuxostat 80 MG Tabs Commonly known as: Uloric Take 1 tablet (80 mg total) by mouth every morning. NEEDS FOLLOW UP APPOINTMENT FOR ANYMORE REFILLS   fenofibrate 160 MG tablet Take 160 mg by mouth daily.   gabapentin 100 MG capsule Commonly known as:  NEURONTIN Take 1 capsule (100 mg total) by mouth 2 (two) times daily. What changed: Another medication with the same name was removed. Continue taking this medication, and follow the directions you see here.   hydrOXYzine 25 MG tablet Commonly known as: ATARAX Take 1 tablet (25 mg total) by mouth 3 (three) times daily as needed for itching.   isosorbide mononitrate 120 MG 24 hr tablet Commonly known as: IMDUR Take 1 tablet (120 mg total) by mouth every morning. NEEDS FOLLOW UP APPOINTMENT FOR ANYMORE REFILLS What changed:  when to take this additional instructions   nitroGLYCERIN 0.4 MG SL tablet Commonly known as: Nitrostat Place 1 tablet (0.4 mg total) under the tongue every 5 (five) minutes as needed for chest pain.   NovoLOG FlexPen 100 UNIT/ML FlexPen Generic drug: insulin aspart Inject 8 Units into the skin 3 (three) times daily before meals. What changed:  medication strength how much to take   Oxycodone HCl 10 MG Tabs Take 10 mg by mouth daily as needed. What changed: Another medication with the same name was removed. Continue taking this medication, and follow the directions you see here.   ranolazine 500 MG 12 hr tablet Commonly known as: RANEXA Take 1 tablet (500 mg total) by mouth 2 (two) times daily. NEEDS FOLLOW UP APPOINTMENT FOR ANYMORE REFILLS What changed: additional instructions   rosuvastatin 5 MG tablet Commonly known as: Crestor TAKE ONE TABLET BY MOUTH ONCE DAILY FOR CHOLESTEROL What changed:  how much to take how to take this when to take this additional instructions   Torsemide 40 MG Tabs Take 40 mg by mouth 2 (two) times daily. What changed: Another medication with the same name was removed. Continue taking this medication, and follow the directions you see here.        Follow-up Information     Jacklynn Ganong, MD. Go on 12/25/2021.   Specialty: Family Medicine Why: @10 :[place] information: [address]  [place] [address] [phone]         Larey Dresser, MD .   Specialty: Cardiology Contact information: [place] [address] [phone]         [facility] Follow up.   Why: Agency will call you to set up apt times Contact information: [place] [address] [phone]         Kalman Shan Ratliff, DO Follow up.   Specialty: Internal Medicine Why: wound clinic apt  at 8:00 Contact information: [address] [place] [address] [phone]                Discharge Exam: BP 133/64 (BP Location: Right Arm)   Pulse 99   Temp 99.8 F (37.7 C) (Oral)   Resp 18   Ht 5' 7"  (1.702 m)   Wt (!) 148.6 kg   SpO2 99%   BMI 51.31 kg/m   General exam: Appears calm and comfortable Respiratory system: Clear to auscultation. Respiratory effort normal. Cardiovascular  system: S1 & S2 heard, RRR. Gastrointestinal system: Abdomen is nondistended, soft and nontender. No organomegaly or masses felt. Normal bowel sounds heard. Central nervous system: Alert and oriented. No focal neurological deficits. Musculoskeletal: No edema. No calf tenderness Skin: Skin peeling of upper arms Psychiatry: Judgement and insight appear normal. Mood & affect appropriate.   Condition at discharge: stable  The results of significant diagnostics from this hospitalization (including imaging, microbiology, ancillary and laboratory) are listed below for reference.   Imaging Studies: IR Fluoro Guide CV Line Right  Result Date: 12/30/2021 INDICATION: 56 year old female referred for tunneled hemodialysis catheter EXAM: TUNNELED CENTRAL VENOUS HEMODIALYSIS CATHETER PLACEMENT WITH ULTRASOUND AND FLUOROSCOPIC GUIDANCE MEDICATIONS: 1 g vancomycin. The antibiotic was given in an appropriate time interval prior to skin puncture. ANESTHESIA/SEDATION: Moderate (conscious) sedation was not employed during this procedure. A total of  Versed 0 mg and Fentanyl 25 mcg was administered intravenously by the radiology nurse. Total intra-service moderate Sedation Time: 0 minutes. The patient's level of consciousness and vital signs were monitored continuously by radiology nursing throughout the procedure under my direct supervision. FLUOROSCOPY: Radiation Exposure Index (as provided by the fluoroscopic device): 5 mGy Kerma COMPLICATIONS: None PROCEDURE: Informed written consent was obtained from the patient after a discussion of the risks, benefits, and alternatives to treatment. Questions regarding the procedure were encouraged and answered. The right neck and chest were prepped with chlorhexidine in a sterile fashion, and a sterile drape was applied covering the operative field. Maximum barrier sterile technique with sterile gowns and gloves were used for the procedure. A timeout was performed prior to the initiation of the procedure. Ultrasound survey was performed. The right internal jugular vein was confirmed to be patent, with images stored and sent to PACS. Micropuncture kit was utilized to access the right internal jugular vein under direct, real-time ultrasound guidance after the overlying soft tissues were anesthetized with 1% lidocaine with epinephrine. Stab incision was made with 11 blade scalpel. Microwire was passed centrally. The microwire was then marked to measure appropriate internal catheter length. External tunneled length was estimated. A total tip to cuff length of 19 cm was selected. 035 guidewire was advanced to the level of the IVC. Skin and subcutaneous tissues of chest wall below the clavicle were generously infiltrated with 1% lidocaine for local anesthesia. A small stab incision was made with 11 blade scalpel. The selected hemodialysis catheter was tunneled in a retrograde fashion from the anterior chest wall to the venotomy incision. Serial dilation was performed and then a peel-away sheath was placed. The catheter was  then placed through the peel-away sheath with tips ultimately positioned within the superior aspect of the right atrium. Final catheter positioning was confirmed and documented with a spot radiographic image. The catheter aspirates and flushes normally. The catheter was flushed with appropriate volume heparin dwells. The catheter exit site was secured with a 0-Prolene retention suture. Gel-Foam slurry was infused into the soft tissue tract. The venotomy incision was closed Derma bond and sterile dressing. Dressings were applied at the chest wall. Patient tolerated the procedure well and remained hemodynamically stable throughout. No complications were encountered and no significant blood loss encountered. IMPRESSION: Status post placement of right IJ tunneled hemodialysis catheter Signed, Dulcy Fanny. Nadene Rubins, RPVI [facility] Radiology Electronically Signed   By: Corrie Mckusick D.O.   On: 12/30/2021 17:28   IR US Guide Vasc Access Right  Result Date: 12/30/2021 INDICATION: 56 year old female referred for tunneled hemodialysis catheter  EXAM: TUNNELED CENTRAL VENOUS HEMODIALYSIS CATHETER PLACEMENT WITH ULTRASOUND AND FLUOROSCOPIC GUIDANCE MEDICATIONS: 1 g vancomycin. The antibiotic was given in an appropriate time interval prior to skin puncture. ANESTHESIA/SEDATION: Moderate (conscious) sedation was not employed during this procedure. A total of Versed 0 mg and Fentanyl 25 mcg was administered intravenously by the radiology nurse. Total intra-service moderate Sedation Time: 0 minutes. The patient's level of consciousness and vital signs were monitored continuously by radiology nursing throughout the procedure under my direct supervision. FLUOROSCOPY: Radiation Exposure Index (as provided by the fluoroscopic device): 5 mGy Kerma COMPLICATIONS: None PROCEDURE: Informed written consent was obtained from the patient after a discussion of the risks, benefits, and  alternatives to treatment. Questions regarding the procedure were encouraged and answered. The right neck and chest were prepped with chlorhexidine in a sterile fashion, and a sterile drape was applied covering the operative field. Maximum barrier sterile technique with sterile gowns and gloves were used for the procedure. A timeout was performed prior to the initiation of the procedure. Ultrasound survey was performed. The right internal jugular vein was confirmed to be patent, with images stored and sent to PACS. Micropuncture kit was utilized to access the right internal jugular vein under direct, real-time ultrasound guidance after the overlying soft tissues were anesthetized with 1% lidocaine with epinephrine. Stab incision was made with 11 blade scalpel. Microwire was passed centrally. The microwire was then marked to measure appropriate internal catheter length. External tunneled length was estimated. A total tip to cuff length of 19 cm was selected. 035 guidewire was advanced to the level of the IVC. Skin and subcutaneous tissues of chest wall below the clavicle were generously infiltrated with 1% lidocaine for local anesthesia. A small stab incision was made with 11 blade scalpel. The selected hemodialysis catheter was tunneled in a retrograde fashion from the anterior chest wall to the venotomy incision. Serial dilation was performed and then a peel-away sheath was placed. The catheter was then placed through the peel-away sheath with tips ultimately positioned within the superior aspect of the right atrium. Final catheter positioning was confirmed and documented with a spot radiographic image. The catheter aspirates and flushes normally. The catheter was flushed with appropriate volume heparin dwells. The catheter exit site was secured with a 0-Prolene retention suture. Gel-Foam slurry was infused into the soft tissue tract. The venotomy incision was closed Derma bond and sterile dressing. Dressings were  applied at the chest wall. Patient tolerated the procedure well and remained hemodynamically stable throughout. No complications were encountered and no significant blood loss encountered. IMPRESSION: Status post placement of right IJ tunneled hemodialysis catheter Signed, Dulcy Fanny. Nadene Rubins, RPVI [facility] Radiology Electronically Signed   By: Corrie Mckusick D.O.   On: 12/30/2021 17:28   CT HIP LEFT WO CONTRAST  Result Date: 12/25/2021 CLINICAL DATA:  Hip trauma, fracture suspected, xray done EXAM: CT OF THE LEFT HIP WITHOUT CONTRAST TECHNIQUE: Multidetector CT imaging of the left hip was performed according to the standard protocol. Multiplanar CT image reconstructions were also generated. RADIATION DOSE REDUCTION: This exam was performed according to the departmental dose-optimization program which includes automated exposure control, adjustment of the mA and/or kV according to patient size and/or use of iterative reconstruction technique. COMPARISON:  X-ray 12/24/2021 FINDINGS: Bones/Joint/Cartilage No acute fracture. No dislocation. Joint is positioned in external rotation. No evidence of femoral head avascular necrosis. Mild osteoarthritis of the left hip with joint space narrowing and marginal osteophyte formation. No  appreciable hip joint effusion. Included portion of the left hemipelvis appears intact without evidence of fracture or diastasis. No suspicious lytic or sclerotic bone lesion. Ligaments Suboptimally assessed by CT. Muscles and Tendons No acute musculotendinous abnormality by CT. Soft tissues Generalized body wall edema. No focal is evident hematoma about the left hip. No left inguinal lymphadenopathy. Atherosclerotic vascular calcifications. IMPRESSION: 1. No acute fracture or dislocation of the left hip. 2. Mild osteoarthritis of the left hip. 3. Generalized body wall edema. Electronically Signed   By: Davina Poke D.O.   On:  12/25/2021 08:13   DG HIP UNILAT WITH PELVIS 2-3 VIEWS LEFT  Result Date: 12/24/2021 CLINICAL DATA:  Fall.  Left hip pain. EXAM: DG HIP (WITH OR WITHOUT PELVIS) 2-3V LEFT COMPARISON:  None Available. FINDINGS: Large overlying body habitus limits evaluation of fine bony detail. On frontal view of the bilateral hips, there is asymmetric oblique angulation of the left femoral neck with overlying greater trochanter common femoral neck, and femoral head which limits evaluation of the proximal left femur. On dedicated frontal and lateral views of the left hip there appears to be mild inferior and minimal anterior displacement of the femoral neck with respect to the femoral head, suspicious for an acute subcapital fracture there is associated mild linear lucency. The bilateral sacroiliac joint spaces are maintained. Mild superior pubic symphysis joint space narrowing. Calcifications overlying the left hemipelvis are compatible with a calcified fibroid but nonspecific. IMPRESSION: There appears to be mild step-off/displacement of the proximal left femoral neck suspicious for an acute proximal left femoral neck fracture with mild inferior and minimal anterior displacement of the distal fracture component. Electronically Signed   By: Yvonne Kendall M.D.   On: 12/24/2021 12:01   CT MAXILLOFACIAL WO CONTRAST  Result Date: 12/22/2021 CLINICAL DATA:  Temporomandibular joint pain or limited movement. Left-sided pain. Left facial swelling. EXAM: CT MAXILLOFACIAL WITHOUT CONTRAST TECHNIQUE: Multidetector CT imaging of the maxillofacial structures was performed. Multiplanar CT image reconstructions were also generated. RADIATION DOSE REDUCTION: This exam was performed according to the departmental dose-optimization program which includes automated exposure control, adjustment of the mA and/or kV according to patient size and/or use of iterative reconstruction technique. COMPARISON:  None Available. FINDINGS: Osseous: The right  temporomandibular joint appears normal. The left temporomandibular joint shows very minimal degenerative change with slight flattening of the mandibular condyle and a single tiny lateral cyst. No effusion is detectable by CT. Surrounding soft tissues do not show any edematous or inflammatory change. Otherwise, the bones of the face appear normal. The patient does have extensive dental and periodontal disease of the residual maxillary and mandibular dentition, but there is no evidence of regional soft tissue inflammatory complication. Orbits: Normal Sinuses: Clear Soft tissues: Numerous microcalcifications noted within the substance of the parotid glands. This can be seen with chronic Sjogren disease. Submandibular glands show atrophic changes without calcifications. No other regional soft tissue finding. Limited intracranial: Normal IMPRESSION: CT shows minimal degenerative change of the left temporomandibular joint with slight flattening of the mandibular condyle and a tiny intra osseous cyst along the lateral margin. See above for full discussion. Electronically Signed   By: Nelson Chimes M.D.   On: 12/22/2021 15:17   MR FOOT RIGHT WO CONTRAST  Result Date: 12/20/2021 CLINICAL DATA:  Diabetes. Foot swelling. Osteomyelitis suspected. Abscess. EXAM: MRI OF THE RIGHT FOREFOOT WITHOUT CONTRAST TECHNIQUE: Multiplanar, multisequence MR imaging of the right forefoot was performed. No intravenous contrast was administered. COMPARISON:  Right foot radiographs  12/17/2021; MRI right forefoot 10/25/2021 FINDINGS: Bones/Joint/Cartilage Postsurgical changes are again seen of amputation of the great toe phalanges and medial greater than lateral aspect of the distal 1st metatarsal including the metatarsal head and medial aspect of the metaphysis. The amputation site is again sharp without residual bone marrow edema to suggest osteomyelitis. There is increased now mild-to-moderate soft tissue swelling and edema within the soft  tissues just distal to the amputation site, greatest in the plantar region and suspicious for cellulitis. The previously seen lobular superficial plantar fluid collections within the plantar medial aspect of the forefoot at the level of the proximal and distal aspects of the great toe metatarsal shaft are no longer visualized. Mild thinning of the reason all plantar subcutaneous fat where these fluid collections previously were located. There is decreased T1 and decreased T2 signal scarring within the plantar medial forefoot again just distal to the tip of the abandoned flexor hallucis longus tendon, where previously fluid contact in the plantar aspect of the tendon on the prior 10/25/2021 MRI. Moderate diffuse tarsometatarsal cartilage thinning, joint space narrowing, and peripheral osteophytosis. a similar cartilage thinning and joint space narrowing at the navicular-intermediate cuneiform articulation with moderate cuneiform side subchondral degenerative cystic change, similar to prior. No cortical erosion is seen to indicate acute osteomyelitis. Ligaments The Lisfranc ligament complex is intact. Collateral ligaments within the second through fifth metatarsophalangeal joints appear intact. Muscles and Tendons Interval increase in now moderate diffuse midfoot muscle edema. Minimal fluid within the flexor hallucis longus tendon sheath just proximal to the knot of Henry (series 5, image 1). Soft tissues Moderate sign and fluid bright signal throughout the dorsal foot subcutaneous fat, superficial to the extensor tendons, appears unchanged from prior. IMPRESSION: Compared to 10/25/2021: 1. Postsurgical changes of amputation of the great toe phalanges and medial greater than lateral aspect of the distal 1st metatarsal. Increased now mild-to-moderate soft tissue swelling and edema just distal to the amputation site, greatest in the plantar region and suspicious for cellulitis. No definite cortical erosion or marrow  edema to indicate acute osteomyelitis. 2. The previously seen lobular superficial plantar fluid collections (previously thought to reflect skin blistering) within the plantar medial aspect of the forefoot at the level of the proximal and distal aspects of the great toe metatarsal shaft are no longer visualized. There is scarring within the plantar medial forefoot just distal to the tip of the abandoned flexor hallucis longus tendon, where previously fluid contacted the plantar aspect of the tendon on the prior 10/25/2021 MRI. 3. Interval increase in now moderate diffuse midfoot muscle edema. 4. Unchanged moderate swelling and fluid signal throughout the subcutaneous fat of the dorsal foot. Electronically Signed   By: Yvonne Kendall M.D.   On: 12/20/2021 12:04   US RENAL  Result Date: 12/18/2021 CLINICAL DATA:  Acute kidney injury. EXAM: RENAL / URINARY TRACT ULTRASOUND COMPLETE COMPARISON:  October 30, 2021. FINDINGS: Right Kidney: Renal measurements: 11.9 x 6.5 x 4.8 cm = volume: 192 mL. Echogenicity within normal limits. No mass or hydronephrosis visualized. Left Kidney: Renal measurements: 12.1 x 6.6 x 6.3 cm = volume: 259 mL. Echogenicity within normal limits. No mass or hydronephrosis visualized. Bladder: Not visualized due to body habitus. Other: None. IMPRESSION: Normal renal ultrasound. Electronically Signed   By: Marijo Conception M.D.   On: 12/18/2021 17:31   DG Foot Complete Right  Result Date: 12/17/2021 CLINICAL DATA:  Osteomyelitis of right foot EXAM: RIGHT FOOT COMPLETE - 3+ VIEW COMPARISON:  Right foot  MRI 10/25/2021 FINDINGS: Again seen are amputations of the first metatarsal head and toe. No new cortical erosions or periosteal reaction. There is some soft tissue swelling of the forefoot. No acute fracture or dislocation. Peripheral vascular calcifications are present. IMPRESSION: 1.  Soft tissue swelling of the forefoot. 2. No radiographic evidence of osteomyelitis. If there is persistent  clinical concern, consider MRI for further evaluation. 3.  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 Electronically Signed   By: Ronney Asters M.D.   On: 12/17/2021 22:07    Microbiology: Results for orders placed or performed during the hospital encounter of 12/17/21  Culture, blood (Routine X 2) w Reflex to ID Panel     Status: None   Collection Time: 12/19/21 12:52 PM   Specimen: BLOOD LEFT HAND  Result Value Ref Range Status   Specimen Description BLOOD LEFT HAND  Final   Special Requests   Final    BOTTLES DRAWN AEROBIC AND ANAEROBIC Blood Culture results may not be optimal due to an inadequate volume of blood received in culture bottles   Culture   Final    NO GROWTH 5 DAYS Performed at [facility], [place] [address], [place], [place] [address]    Report Status 12/24/2021 FINAL  Final  Culture, blood (Routine X 2) w Reflex to ID Panel     Status: None   Collection Time: 12/19/21 12:52 PM   Specimen: BLOOD LEFT FOREARM  Result Value Ref Range Status   Specimen Description BLOOD LEFT FOREARM  Final   Special Requests   Final    BOTTLES DRAWN AEROBIC AND ANAEROBIC Blood Culture results may not be optimal due to an inadequate volume of blood received in culture bottles   Culture   Final    NO GROWTH 5 DAYS Performed at [facility], [place] [address], [place], [place] [address]    Report Status 12/24/2021 FINAL  Final  SARS Coronavirus 2 by RT PCR (hospital order, performed in [facility] hospital lab) *cepheid single result test* Anterior Nasal Swab     Status: None   Collection Time: 12/21/21 12:42 PM   Specimen: Anterior Nasal Swab  Result Value Ref Range Status   SARS Coronavirus 2 by RT PCR NEGATIVE NEGATIVE Final    Comment: (NOTE) SARS-CoV-2 target nucleic acids are NOT DETECTED.  The SARS-CoV-2 RNA is generally detectable in upper and lower respiratory specimens during the acute phase of infection. The lowest concentration of SARS-CoV-2 viral copies  this assay can detect is 250 copies / mL. A negative result does not preclude SARS-CoV-2 infection and should not be used as the sole basis for treatment or other patient management decisions.  A negative result may occur with improper specimen collection / handling, submission of specimen other than nasopharyngeal swab, presence of viral mutation(s) within the areas targeted by this assay, and inadequate number of viral copies (<250 copies / mL). A negative result must be combined with clinical observations, patient history, and epidemiological information.  Fact Sheet for Patients:   https://www.patel.info/  Fact Sheet for Healthcare Providers: https://hall.com/  This test is not yet approved or  cleared by the [place] FDA and has been authorized for detection and/or diagnosis of SARS-CoV-2 by FDA under an Emergency Use Authorization (EUA).  This EUA will remain in effect (meaning this test can be used) for the duration of the COVID-19 declaration under Section 564(b)(1) of the Act, 21 U.S.C. section 360bbb-3(b)(1), unless the authorization is  terminated or revoked sooner.  Performed at [facility], [place] [address] [place], [place] [address]     Labs: CBC: Recent Labs  Lab 01/01/22 0032 01/03/22 0034 01/03/22 0959 01/05/22 0059 01/06/22 0046  WBC 7.2 8.0 8.1 6.3 5.8  HGB 7.4* 8.4* 8.0* 7.6* 7.2*  HCT 22.8* 26.1* 26.1* 24.6* 22.4*  MCV 83.5 85.3 86.1 86.0 85.5  PLT 124* 130* 141* 123* 035*   Basic Metabolic Panel: Recent Labs  Lab 12/31/21 0429 01/01/22 0032 01/02/22 0119 01/03/22 0034 01/04/22 0101 01/05/22 0059 01/06/22 0046  NA 137   < > 140 136 137 134* 133*  K 3.2*   < > 3.4* 4.1 4.0 3.5 3.7  CL 102   < > 103 103 102 99 100  CO2 24   < > 27 26 27 26 26   GLUCOSE 165*   < > 186* 234* 152* 160* 142*  BUN 111*   < > 62* 36* 40* 43* 44*  CREATININE 2.93*   < > 1.99* 1.76* 2.25* 2.43* 2.49*  CALCIUM  8.4*   < > 8.3* 8.2* 8.5* 8.4* 8.4*  MG 1.9  --   --   --   --   --   --   PHOS 3.2   < > 2.5 1.6* 2.5 2.4* 2.8   < > = values in this interval not displayed.   Liver Function Tests: Recent Labs  Lab 12/30/21 1829 12/31/21 0429 01/02/22 0119 01/03/22 0034 01/04/22 0101 01/05/22 0059 01/06/22 0046  AST 27  --   --   --   --   --   --   ALT 21  --   --   --   --   --   --   ALKPHOS 30*  --   --   --   --   --   --   BILITOT 0.9  --   --   --   --   --   --   PROT 6.9  --   --   --   --   --   --   ALBUMIN 2.0*  2.0*   < > 1.8* 2.1* 2.0* 2.1* 2.0*   < > = values in this interval not displayed.   CBG: Recent Labs  Lab 01/05/22 1152 01/05/22 1606 01/05/22 2111 01/06/22 0557 01/06/22 1135  GLUCAP 152* 149* 177* 164* 216*    Discharge time spent: 35 minutes.  Signed: Cordelia Poche, MD [facility] 01/06/2022

## 2022-01-06 NOTE — Care Management Important Message (Signed)
Important Message  Patient Details  Name: Anwyn Kriegel MRN: [id number] Date of Birth: [birth year]   Medicare Important Message Given:  Yes     Shelda Altes 01/06/2022, 12:01 PM

## 2022-01-06 NOTE — Discharge Instructions (Addendum)
[name],  You were in the hospital because of a foot infection that was treated with antibiotics. While you were here, you had worsening kidney function that required a few sessions of hemodialysis. Thankfully your kidney function appears to have improved. Please follow-up with your PCP and a nephrologist.

## 2022-01-06 NOTE — TOC Progression Note (Signed)
Transition of Care [facility]) - Progression Note    Patient Details  Name: [name] MRN: [id number] Date of Birth: Apr 11, 1965  Transition of [facility]) CM/SW Contact  Zenon Mayo, RN Phone Number: 01/06/2022, 12:22 PM  Clinical Narrative:    Patient will not need HD now, MD states he plans to dc patient home with [facility] today.    Expected Discharge Plan: Skilled Nursing Facility Barriers to Discharge: Continued Medical Work up  Expected Discharge Plan and Services Expected Discharge Plan: [place] In-house Referral: NA Discharge Planning Services: CM Consult Post Acute Care Choice: [place] arrangements for the past 2 months: Single Family Home Expected Discharge Date: 01/06/22                         HH Arranged: RN, PT, OT [place] Agency: [facility]         Social Determinants of Health (SDOH) Interventions    Readmission Risk Interventions    12/18/2021    2:02 PM 11/05/2021    1:58 PM  Readmission Risk Prevention Plan  Transportation Screening Complete Complete  PCP or Specialist Appt within 5-7 Days  Complete  PCP or Specialist Appt within 3-5 Days Complete   Home Care Screening  Complete  Medication Review (RN CM)  Complete  HRI or Home Care Consult Complete   Social Work Consult for [place] Planning/Counseling Complete   Palliative Care Screening Not Applicable   Medication Review [employer]) Complete

## 2022-01-06 NOTE — Progress Notes (Signed)
Physical Therapy Treatment Patient Details Name: [name] MRN: [id number] DOB: Sep 28, 1965 Today's Date: 01/06/2022   History of Present Illness Pt is a 56 y/o F who presented to [facility] on 12/11/21 with R foot ulcers with concern of infection, transferred to [facility] on 9/27. Imaging revealed R foot swelling, no clear evidence of osteomyelitis. Workup for Rt foot diabetic infection, AKI on CKD, and fever. 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 Pt able to come to sitting EOB with light min assist to manage Les. Pt demonstrating good power up to standing with RW with light cues at start for safe hand placement. Pt ambulating house hold distance with min guard for safety with RW and no overt LOB noted. Extended time spent in discussion re; activity recommendations, energy conservation, increasing activity slowly and importance of continued mobility as pt continues to fatigue quickly with pt verbalizing understanding. Pt continues to benefit from skilled PT services to progress toward functional mobility goals.    Recommendations for follow up therapy are one component of a multi-disciplinary discharge planning process, led by the attending physician.  Recommendations may be updated based on patient status, additional functional criteria and insurance authorization.  Follow Up Recommendations  Skilled nursing-short term rehab (<3 hours/day) Can patient physically be transported by private vehicle: No   Assistance Recommended at Discharge Frequent or constant Supervision/Assistance  Patient can return home with the following A lot of help with walking and/or  transfers;A lot of help with bathing/dressing/bathroom;Assistance with cooking/housework;Assist for transportation;Help with stairs or ramp for entrance   Equipment Recommendations  None recommended by PT    Recommendations for Other Services       Precautions / Restrictions Precautions Precautions: Fall Precaution Comments: monitor LE skin     Mobility  Bed Mobility Overal bed mobility: Needs Assistance Bed Mobility: Supine to Sit, Sit to Supine     Supine to sit: Min assist Sit to supine: Mod assist   General bed mobility comments: asssit for LEs    Transfers Overall transfer level: Needs assistance Equipment used: Rolling walker (2 wheels) Transfers: Sit to/from Stand, Bed to chair/wheelchair/BSC Sit to Stand: Min guard           General transfer comment: min gaurd from EOB and BSC, good power up, light cues for safe hand placement    Ambulation/Gait Ambulation/Gait assistance: Min guard Gait Distance (Feet): 12 Feet (x2) Assistive device: Rolling walker (2 wheels) Gait Pattern/deviations: Step-through pattern, Decreased stride length Gait velocity: decr     General Gait Details: slow but stable gait with RW, no LOB, fatigues quickly   Stairs             Wheelchair Mobility    Modified Rankin (Stroke Patients Only)       Balance Overall balance assessment: Needs assistance Sitting-balance support: Feet supported, No upper extremity supported Sitting balance-Leahy Scale: Fair     Standing balance support: Bilateral upper extremity supported, During functional activity Standing balance-Leahy Scale: Poor Standing balance comment: reliant on UE support and min A  Cognition Arousal/Alertness: Awake/alert Behavior During Therapy: Flat affect Overall Cognitive Status: Within Functional Limits for tasks assessed                                          Exercises      General  Comments General comments (skin integrity, edema, etc.): extended time spent in discussion and education for activity reccomendations and increasing activity slowly post acutely as well as energy conservation      Pertinent Vitals/Pain Pain Assessment Pain Assessment: Faces Faces Pain Scale: Hurts a little bit Pain Location: LLE Pain Descriptors / Indicators: Guarding Pain Intervention(s): Monitored during session, Limited activity within patient's tolerance    Home Living                          Prior Function            PT Goals (current goals can now be found in the care plan section) Acute Rehab PT Goals Patient Stated Goal: to go home PT Goal Formulation: With patient Time For Goal Achievement: 01/06/22    Frequency    Min 3X/week      PT Plan      Co-evaluation              AM-PAC PT "6 Clicks" Mobility   Outcome Measure  Help needed turning from your back to your side while in a flat bed without using bedrails?: A Lot Help needed moving from lying on your back to sitting on the side of a flat bed without using bedrails?: A Lot Help needed moving to and from a bed to a chair (including a wheelchair)?: A Lot Help needed standing up from a chair using your arms (e.g., wheelchair or bedside chair)?: A Little Help needed to walk in hospital room?: A Little Help needed climbing 3-5 steps with a railing? : Total 6 Click Score: 13    End of Session   Activity Tolerance: Patient limited by fatigue Patient left: in bed;with call bell/phone within reach Nurse Communication: Mobility status PT Visit Diagnosis: Muscle weakness (generalized) (M62.81)     Time: 1010-1033 PT Time Calculation (min) (ACUTE ONLY): 23 min  Charges:  $Gait Training: 8-22 mins $Therapeutic Activity: 8-22 mins                     Lenon Kuennen R. PTA Acute Rehabilitation Services Office: [place] 01/06/2022, 11:47 AM

## 2022-01-06 NOTE — Progress Notes (Signed)
Patient ID: Stepheni Cameron, female   DOB: [birth year], 56 y.o.   MRN: [id number] S: Patient seen and examined bedside in conjunction with palliative care. No acute events overnight. She reports that she does feel like she urinates every time she drinks/eats. No other complaints.  O:BP (!) 102/42 (BP Location: Right Arm)   Pulse 99   Temp 100.1 F (37.8 C) (Oral)   Resp 20   Ht '5\' 7"'$  (1.702 m)   Wt (!) 148.6 kg   SpO2 100%   BMI 51.31 kg/m   Intake/Output Summary (Last 24 hours) at 01/06/2022 0918 Last data filed at 01/06/2022 0825 Gross per 24 hour  Intake 720 ml  Output 1000 ml  Net -280 ml   Intake/Output: I/O last 3 completed shifts: In: 840 [P.O.:840] Out: 1500 [Urine:1500]  Intake/Output this shift:  Total I/O In: 240 [P.O.:240] Out: 250 [Urine:250] Weight change: 0.136 kg Gen: NAD CVS: RRR Resp:CTA Abd: +BS, soft,NT/nD PNT:IRWERX edema with chronic venous stasis changes bilaterally Neuro: awake, alert, no asterixis Dialysis access: [facility] c/d/i  Recent Labs  Lab 12/30/21 1829 12/31/21 0429 01/01/22 0032 01/02/22 0119 01/03/22 0034 01/04/22 0101 01/05/22 0059 01/06/22 0046  NA 138 137 138 140 136 137 134* 133*  K 3.8 3.2* 3.5 3.4* 4.1 4.0 3.5 3.7  CL 105 102 105 103 103 102 99 100  CO2 '22 24 25 27 26 27 26 26  '$ GLUCOSE 180* 165* 198* 186* 234* 152* 160* 142*  BUN 150* 111* 109* 62* 36* 40* 43* 44*  CREATININE 3.72* 2.93* 2.88* 1.99* 1.76* 2.25* 2.43* 2.49*  ALBUMIN 2.0*  2.0* 1.8* 1.8* 1.8* 2.1* 2.0* 2.1* 2.0*  CALCIUM 8.8* 8.4* 8.6* 8.3* 8.2* 8.5* 8.4* 8.4*  PHOS 4.5 3.2 3.2 2.5 1.6* 2.5 2.4* 2.8  AST 27  --   --   --   --   --   --   --   ALT 21  --   --   --   --   --   --   --    Liver Function Tests: Recent Labs  Lab 12/30/21 1829 12/31/21 0429 01/04/22 0101 01/05/22 0059 01/06/22 0046  AST 27  --   --   --   --   ALT 21  --   --   --   --   ALKPHOS 30*  --   --   --   --   BILITOT 0.9  --   --   --   --   PROT 6.9  --   --   --   --    ALBUMIN 2.0*  2.0*   < > 2.0* 2.1* 2.0*   < > = values in this interval not displayed.   No results for input(s): "LIPASE", "AMYLASE" in the last 168 hours. No results for input(s): "AMMONIA" in the last 168 hours. CBC: Recent Labs  Lab 01/01/22 0032 01/03/22 0034 01/03/22 0959 01/05/22 0059 01/06/22 0046  WBC 7.2 8.0 8.1 6.3 5.8  HGB 7.4* 8.4* 8.0* 7.6* 7.2*  HCT 22.8* 26.1* 26.1* 24.6* 22.4*  MCV 83.5 85.3 86.1 86.0 85.5  PLT 124* 130* 141* 123* 106*   Cardiac Enzymes: No results for input(s): "CKTOTAL", "CKMB", "CKMBINDEX", "TROPONINI" in the last 168 hours. CBG: Recent Labs  Lab 01/05/22 0612 01/05/22 1152 01/05/22 1606 01/05/22 2111 01/06/22 0557  GLUCAP 128* 152* 149* 177* 164*    Iron Studies: No results for input(s): "IRON", "TIBC", "TRANSFERRIN", "FERRITIN" in the last 72 hours. Studies/Results:  No results found.  (feeding supplement) PROSource Plus  30 mL Oral TID BM   carvedilol  12.5 mg Oral BID WC   Chlorhexidine Gluconate Cloth  6 each Topical Daily   darbepoetin (ARANESP) injection - NON-DIALYSIS  60 mcg Subcutaneous Q Sat-1800   gabapentin  100 mg Oral QHS   hydrocerin   Topical TID   insulin aspart  0-15 Units Subcutaneous TID WC   insulin aspart  0-5 Units Subcutaneous QHS   insulin glargine-yfgn  28 Units Subcutaneous BID   isosorbide mononitrate  60 mg Oral q morning   rosuvastatin  5 mg Oral Daily   torsemide  40 mg Oral BID   triamcinolone cream   Topical BID    BMET    Component Value Date/Time   NA 133 (L) 01/06/2022 0046   K 3.7 01/06/2022 0046   CL 100 01/06/2022 0046   CO2 26 01/06/2022 0046   GLUCOSE 142 (H) 01/06/2022 0046   BUN 44 (H) 01/06/2022 0046   CREATININE 2.49 (H) 01/06/2022 0046   CALCIUM 8.4 (L) 01/06/2022 0046   GFRNONAA 22 (L) 01/06/2022 0046   GFRAA 33 (L) 06/15/2019 1237   CBC    Component Value Date/Time   WBC 5.8 01/06/2022 0046   RBC 2.62 (L) 01/06/2022 0046   HGB 7.2 (L) 01/06/2022 0046   HCT 22.4  (L) 01/06/2022 0046   PLT 106 (L) 01/06/2022 0046   MCV 85.5 01/06/2022 0046   MCH 27.5 01/06/2022 0046   MCHC 32.1 01/06/2022 0046   RDW 16.8 (H) 01/06/2022 0046   LYMPHSABS 2.4 02/24/2010 1320   MONOABS 0.6 02/24/2010 1320   EOSABS 0.1 02/24/2010 1320   BASOSABS 0.0 02/24/2010 1320    Assessment/Plan:   AKI/CKD stage IV - has had multiple episodes of AKI/CKD with progressively worsening renal function over the past 3 months.  Baseline had been 2.2-2.4 prior to hospitalization at [facility] but then rose to 3.4 on 12/16/21 prior to this hospitalization. She developed overt uremia with asterixis but had declined HD.  She decided to proceed with HD on short term trial of 1 week and then decide whether she would want to continue or stop HD.  [place] placed 12/30/21 by IR.  First HD session 12/31/21, then 10/12 and 10/13.  Asterixis markedly improved.   Cr remains stable, nonoliguric, mental status stable. Will arrange to have her catheter removed. Offered outpatient follow up at our office shortly after d/c however patient wishes to follow up closer to home in [facility] therefore will need an urgent referral or at least labs checked by PCP shortly after d/c. Okay for discharge from a nephrology perspective once HD catheter is out Diabetic foot ulcer/infection of right leg - evaluated by VVS but arteriogram on hold due to worsening renal function. Per primary HTN - stable, no changes Chronic diastolic CHF - stable Hyponatremia - due to AKI and impaired free water handling.  Continue to follow, na stable Anemia of CKD stage IV - low iron.  s/p feraheme '510mg'$  10/14 with HD, on ESA.  Gean Quint, MD [facility]

## 2022-01-06 NOTE — Progress Notes (Signed)
Patient ID: [name], female   DOB: Nov 18, 1965, 56 y.o.   MRN: [id number]    Progress Note from the Palliative Medicine Team at [facility]   Patient Name: [name]        Date: 01/06/2022 DOB: 1966-01-21  Age: 56 y.o. MRN#: [id number] Attending Physician: Mariel Aloe, MD Primary Care Physician: Jacklynn Ganong, MD Admit Date: 12/17/2021   Medical records reviewed, assessed patient   56 y.o. female with a history of CAD s/p CABG, CKD 4, chronic HFpEF, type II DM, OSA.  Presented with right foot ulcer, AKI.  Vascular surgery, podiatry and nephrology following. Currently no plans for surgery.  Cultures are negative. Has progressive AKI on CKD 4 with multiple uremic symptoms, progressed to HD  Initial palliative consult completed on 12/27/2021   PMT to  continue to support and help patient navigate treatment option decisions, advanced directive decisions and anticipatory care needs moving forward through this hospitalization.  I met again with [name] at bedside and we had continued conversation regarding her current medical situation.  Dr. Candiss Norse at bedside, he has laid out plan to DC HD cath today, stressed importance of nephrology follow-up.    We were able to talk about the seriousness of her multiple co-morbilities; CHF, chronic kidney disease, decreased mobility and wounds.  Patient is high risk for decompensation secondary to multiple comorbidities.  Patient's plan is to discharge home with home health services.  Education offered on the importance of self-care responsibility and treatment plan.  Education offered on the importance of medication and diet compliance and follow-up with PCP and nephrology.  I offered once again to contact family for continuity of care, stressing the importance of family involvement in the success of her treatment plan.  [name]  declines offer.   Again introduced MOST form, after hard copy for her to take home with her  Questions and concerns  addressed   Discussed with Dr Candiss Norse and Dr. Teryl Lucy,   Communicated need for physical therapy, Occupational Therapy and home health services.  Encourage patient to secure her outpatient follow-up appointments with nephrology and primary care before leaving the hospital.  Patient is encouraged to call with questions or concerns  Total time spent on the unit was 50 minutes greater than 50% of the time was spent in counseling coordination of care.   Wadie Lessen NP  Palliative Medicine Team Team Phone # [phone] Pager [phone]

## 2022-01-06 NOTE — TOC Transition Note (Signed)
Transition of Care [facility]) - CM/SW Discharge Note   Patient Details  Name: [name] MRN: [id number] Date of Birth: Aug 15, 1965  Transition of Care [facility]) CM/SW Contact:  Zenon Mayo, RN Phone Number: 01/06/2022, 12:36 PM   Clinical Narrative:    Patient is for dc today, she will not need HD per MD, she is active with [facility] for [place], [place], [place], will resume. NCM notified Jaina with [facility] of dc date, she has a rollator and cane x 2 at  home, she states her daughter Sheria Lang will transport her home around 2 or 3 pm today, she states she does not need to go to the outpatient wound clinic anymore because her foot ulcer has healed, NCM checked with Staff RN regarding this as well, she states yes it has healed.  She has a line in that IR will need to dc before she is discharged today.     Final next level of care: [place] Barriers to Discharge: No Barriers Identified   Patient Goals and CMS Choice Patient states their goals for this hospitalization and ongoing recovery are:: return home with [facility] CMS Medicare.gov Compare Post Acute Care list provided to:: Patient Choice offered to / list presented to : Patient  Discharge Placement                       Discharge Plan and Services In-house Referral: NA Discharge Planning Services: CM Consult Post Acute Care Choice: Home Health            DME Agency: NA       HH Arranged: RN, PT, OT [facility] Agency: [place] Date [place]: 12/22/21 Time [place]: 3491 Representative spoke with at [place]: [place] ([place]) Interventions     Readmission Risk Interventions    12/18/2021    2:02 PM 11/05/2021    1:58 PM  Readmission Risk Prevention Plan  Transportation Screening Complete Complete  PCP or Specialist Appt within 5-7 Days  Complete  PCP or Specialist Appt within 3-5 Days Complete   Home Care Screening  Complete   Medication Review (RN CM)  Complete  HRI or Home Care Consult Complete   Social Work Consult for [place] Planning/Counseling Complete   Palliative Care Screening Not Applicable   Medication Review Press photographer) Complete

## 2022-01-16 ENCOUNTER — Inpatient Hospital Stay (HOSPITAL_COMMUNITY): Payer: Medicare Other

## 2022-01-16 ENCOUNTER — Inpatient Hospital Stay (HOSPITAL_COMMUNITY)
Admission: RE | Admit: 2022-01-16 | Discharge: 2022-02-04 | DRG: 291 | Disposition: A | Discharge status: Skilled Nursing Facility | Payer: Medicare Other | Attending: Internal Medicine | Admitting: Internal Medicine

## 2022-01-16 ENCOUNTER — Encounter (HOSPITAL_COMMUNITY): Payer: Self-pay

## 2022-01-16 DIAGNOSIS — E1159 Type 2 diabetes mellitus with other circulatory complications: Secondary | ICD-10-CM | POA: Diagnosis present

## 2022-01-16 DIAGNOSIS — D638 Anemia in other chronic diseases classified elsewhere: Secondary | ICD-10-CM | POA: Diagnosis not present

## 2022-01-16 DIAGNOSIS — E1169 Type 2 diabetes mellitus with other specified complication: Secondary | ICD-10-CM | POA: Diagnosis present

## 2022-01-16 DIAGNOSIS — Z7982 Long term (current) use of aspirin: Secondary | ICD-10-CM

## 2022-01-16 DIAGNOSIS — I152 Hypertension secondary to endocrine disorders: Secondary | ICD-10-CM | POA: Diagnosis not present

## 2022-01-16 DIAGNOSIS — L511 Stevens-Johnson syndrome: Secondary | ICD-10-CM | POA: Diagnosis not present

## 2022-01-16 DIAGNOSIS — I2510 Atherosclerotic heart disease of native coronary artery without angina pectoris: Secondary | ICD-10-CM | POA: Diagnosis present

## 2022-01-16 DIAGNOSIS — N184 Chronic kidney disease, stage 4 (severe): Secondary | ICD-10-CM | POA: Diagnosis not present

## 2022-01-16 DIAGNOSIS — E611 Iron deficiency: Secondary | ICD-10-CM | POA: Diagnosis present

## 2022-01-16 DIAGNOSIS — I872 Venous insufficiency (chronic) (peripheral): Secondary | ICD-10-CM | POA: Diagnosis present

## 2022-01-16 DIAGNOSIS — E785 Hyperlipidemia, unspecified: Secondary | ICD-10-CM | POA: Diagnosis present

## 2022-01-16 DIAGNOSIS — E119 Type 2 diabetes mellitus without complications: Secondary | ICD-10-CM

## 2022-01-16 DIAGNOSIS — L280 Lichen simplex chronicus: Secondary | ICD-10-CM | POA: Diagnosis present

## 2022-01-16 DIAGNOSIS — L97509 Non-pressure chronic ulcer of other part of unspecified foot with unspecified severity: Secondary | ICD-10-CM | POA: Diagnosis not present

## 2022-01-16 DIAGNOSIS — D631 Anemia in chronic kidney disease: Secondary | ICD-10-CM | POA: Diagnosis present

## 2022-01-16 DIAGNOSIS — N179 Acute kidney failure, unspecified: Secondary | ICD-10-CM | POA: Diagnosis not present

## 2022-01-16 DIAGNOSIS — N185 Chronic kidney disease, stage 5: Secondary | ICD-10-CM | POA: Diagnosis not present

## 2022-01-16 DIAGNOSIS — N390 Urinary tract infection, site not specified: Secondary | ICD-10-CM | POA: Diagnosis present

## 2022-01-16 DIAGNOSIS — E11621 Type 2 diabetes mellitus with foot ulcer: Secondary | ICD-10-CM | POA: Diagnosis present

## 2022-01-16 DIAGNOSIS — Z794 Long term (current) use of insulin: Secondary | ICD-10-CM

## 2022-01-16 DIAGNOSIS — Z888 Allergy status to other drugs, medicaments and biological substances status: Secondary | ICD-10-CM

## 2022-01-16 DIAGNOSIS — E1165 Type 2 diabetes mellitus with hyperglycemia: Secondary | ICD-10-CM | POA: Diagnosis present

## 2022-01-16 DIAGNOSIS — G9349 Other encephalopathy: Secondary | ICD-10-CM | POA: Diagnosis not present

## 2022-01-16 DIAGNOSIS — F8081 Childhood onset fluency disorder: Secondary | ICD-10-CM | POA: Diagnosis present

## 2022-01-16 DIAGNOSIS — Z79899 Other long term (current) drug therapy: Secondary | ICD-10-CM

## 2022-01-16 DIAGNOSIS — I132 Hypertensive heart and chronic kidney disease with heart failure and with stage 5 chronic kidney disease, or end stage renal disease: Principal | ICD-10-CM | POA: Diagnosis present

## 2022-01-16 DIAGNOSIS — G9341 Metabolic encephalopathy: Secondary | ICD-10-CM | POA: Diagnosis present

## 2022-01-16 DIAGNOSIS — I5033 Acute on chronic diastolic (congestive) heart failure: Secondary | ICD-10-CM | POA: Diagnosis present

## 2022-01-16 DIAGNOSIS — I1310 Hypertensive heart and chronic kidney disease without heart failure, with stage 1 through stage 4 chronic kidney disease, or unspecified chronic kidney disease: Secondary | ICD-10-CM | POA: Diagnosis present

## 2022-01-16 DIAGNOSIS — E1122 Type 2 diabetes mellitus with diabetic chronic kidney disease: Secondary | ICD-10-CM | POA: Diagnosis present

## 2022-01-16 DIAGNOSIS — N186 End stage renal disease: Secondary | ICD-10-CM | POA: Diagnosis present

## 2022-01-16 DIAGNOSIS — G4733 Obstructive sleep apnea (adult) (pediatric): Secondary | ICD-10-CM | POA: Diagnosis present

## 2022-01-16 DIAGNOSIS — I252 Old myocardial infarction: Secondary | ICD-10-CM

## 2022-01-16 DIAGNOSIS — Z6841 Body Mass Index (BMI) 40.0 and over, adult: Secondary | ICD-10-CM

## 2022-01-16 DIAGNOSIS — M898X9 Other specified disorders of bone, unspecified site: Secondary | ICD-10-CM | POA: Diagnosis present

## 2022-01-16 DIAGNOSIS — N250 Renal osteodystrophy: Secondary | ICD-10-CM | POA: Diagnosis present

## 2022-01-16 DIAGNOSIS — L97929 Non-pressure chronic ulcer of unspecified part of left lower leg with unspecified severity: Secondary | ICD-10-CM | POA: Diagnosis present

## 2022-01-16 DIAGNOSIS — E1142 Type 2 diabetes mellitus with diabetic polyneuropathy: Secondary | ICD-10-CM | POA: Diagnosis present

## 2022-01-16 DIAGNOSIS — E875 Hyperkalemia: Secondary | ICD-10-CM | POA: Diagnosis not present

## 2022-01-16 DIAGNOSIS — Z8249 Family history of ischemic heart disease and other diseases of the circulatory system: Secondary | ICD-10-CM

## 2022-01-16 DIAGNOSIS — F32A Depression, unspecified: Secondary | ICD-10-CM | POA: Diagnosis present

## 2022-01-16 DIAGNOSIS — R21 Rash and other nonspecific skin eruption: Secondary | ICD-10-CM | POA: Diagnosis present

## 2022-01-16 DIAGNOSIS — Z992 Dependence on renal dialysis: Secondary | ICD-10-CM | POA: Diagnosis not present

## 2022-01-16 DIAGNOSIS — L97519 Non-pressure chronic ulcer of other part of right foot with unspecified severity: Secondary | ICD-10-CM | POA: Diagnosis present

## 2022-01-16 DIAGNOSIS — S91301A Unspecified open wound, right foot, initial encounter: Secondary | ICD-10-CM | POA: Diagnosis present

## 2022-01-16 DIAGNOSIS — I5032 Chronic diastolic (congestive) heart failure: Secondary | ICD-10-CM | POA: Diagnosis present

## 2022-01-16 DIAGNOSIS — D696 Thrombocytopenia, unspecified: Secondary | ICD-10-CM | POA: Diagnosis present

## 2022-01-16 DIAGNOSIS — F410 Panic disorder [episodic paroxysmal anxiety] without agoraphobia: Secondary | ICD-10-CM | POA: Diagnosis present

## 2022-01-16 DIAGNOSIS — N19 Unspecified kidney failure: Secondary | ICD-10-CM | POA: Diagnosis present

## 2022-01-16 DIAGNOSIS — Z91040 Latex allergy status: Secondary | ICD-10-CM

## 2022-01-16 LAB — CBC WITH DIFFERENTIAL/PLATELET
Abs Immature Granulocytes: 0.06 10*3/uL (ref 0.00–0.07)
Basophils Absolute: 0 10*3/uL (ref 0.0–0.1)
Basophils Relative: 0 %
Eosinophils Absolute: 0 10*3/uL (ref 0.0–0.5)
Eosinophils Relative: 0 %
HCT: 27.1 % — ABNORMAL LOW (ref 36.0–46.0)
Hemoglobin: 8.6 g/dL — ABNORMAL LOW (ref 12.0–15.0)
Immature Granulocytes: 1 %
Lymphocytes Relative: 26 %
Lymphs Abs: 2.1 10*3/uL (ref 0.7–4.0)
MCH: 27.3 pg (ref 26.0–34.0)
MCHC: 31.7 g/dL (ref 30.0–36.0)
MCV: 86 fL (ref 80.0–100.0)
Monocytes Absolute: 0.9 10*3/uL (ref 0.1–1.0)
Monocytes Relative: 11 %
Neutro Abs: 5 10*3/uL (ref 1.7–7.7)
Neutrophils Relative %: 62 %
Platelets: 204 10*3/uL (ref 150–400)
RBC: 3.15 MIL/uL — ABNORMAL LOW (ref 3.87–5.11)
RDW: 18.3 % — ABNORMAL HIGH (ref 11.5–15.5)
WBC: 8.1 10*3/uL (ref 4.0–10.5)
nRBC: 0.9 % — ABNORMAL HIGH (ref 0.0–0.2)

## 2022-01-16 LAB — GLUCOSE, CAPILLARY
Glucose-Capillary: 160 mg/dL — ABNORMAL HIGH (ref 70–99)
Glucose-Capillary: 161 mg/dL — ABNORMAL HIGH (ref 70–99)

## 2022-01-16 MED ORDER — HEPARIN SODIUM (PORCINE) 5000 UNIT/ML IJ SOLN
5000.0000 [IU] | Freq: Three times a day (TID) | INTRAMUSCULAR | Status: DC
  Administered 2022-01-17 – 2022-02-03 (×46): 5000 [IU] via SUBCUTANEOUS
  Filled 2022-01-16 (×48): qty 1

## 2022-01-16 MED ORDER — ONDANSETRON HCL 4 MG/2ML IJ SOLN
4.0000 mg | Freq: Four times a day (QID) | INTRAMUSCULAR | Status: DC | PRN
  Administered 2022-01-22 – 2022-01-27 (×2): 4 mg via INTRAVENOUS
  Filled 2022-01-16 (×2): qty 2

## 2022-01-16 MED ORDER — SODIUM BICARBONATE 8.4 % IV SOLN
50.0000 meq | Freq: Once | INTRAVENOUS | Status: DC
  Filled 2022-01-16: qty 50

## 2022-01-16 MED ORDER — INSULIN ASPART 100 UNIT/ML IJ SOLN
0.0000 [IU] | INTRAMUSCULAR | Status: DC
  Administered 2022-01-16: 2 [IU] via SUBCUTANEOUS
  Administered 2022-01-17: 1 [IU] via SUBCUTANEOUS
  Administered 2022-01-17 (×3): 2 [IU] via SUBCUTANEOUS
  Administered 2022-01-18 – 2022-01-20 (×4): 1 [IU] via SUBCUTANEOUS
  Administered 2022-01-20 (×2): 2 [IU] via SUBCUTANEOUS
  Administered 2022-01-21 – 2022-01-23 (×7): 1 [IU] via SUBCUTANEOUS
  Administered 2022-01-23: 2 [IU] via SUBCUTANEOUS
  Administered 2022-01-24: 1 [IU] via SUBCUTANEOUS
  Administered 2022-01-24: 2 [IU] via SUBCUTANEOUS
  Administered 2022-01-24: 1 [IU] via SUBCUTANEOUS
  Administered 2022-01-24: 2 [IU] via SUBCUTANEOUS
  Administered 2022-01-24: 1 [IU] via SUBCUTANEOUS
  Administered 2022-01-24 – 2022-01-25 (×3): 2 [IU] via SUBCUTANEOUS
  Administered 2022-01-25: 1 [IU] via SUBCUTANEOUS
  Administered 2022-01-25 – 2022-01-26 (×7): 2 [IU] via SUBCUTANEOUS
  Administered 2022-01-27 – 2022-01-28 (×7): 1 [IU] via SUBCUTANEOUS
  Administered 2022-01-28 – 2022-01-29 (×3): 2 [IU] via SUBCUTANEOUS
  Administered 2022-01-29: 1 [IU] via SUBCUTANEOUS
  Administered 2022-01-29: 2 [IU] via SUBCUTANEOUS
  Administered 2022-01-29: 3 [IU] via SUBCUTANEOUS
  Administered 2022-01-29: 1 [IU] via SUBCUTANEOUS
  Administered 2022-01-29: 2 [IU] via SUBCUTANEOUS
  Administered 2022-01-30: 1 [IU] via SUBCUTANEOUS
  Administered 2022-01-30: 3 [IU] via SUBCUTANEOUS
  Administered 2022-01-30 – 2022-01-31 (×3): 2 [IU] via SUBCUTANEOUS
  Administered 2022-01-31: 1 [IU] via SUBCUTANEOUS
  Administered 2022-01-31: 2 [IU] via SUBCUTANEOUS
  Administered 2022-01-31: 1 [IU] via SUBCUTANEOUS
  Administered 2022-01-31: 2 [IU] via SUBCUTANEOUS
  Administered 2022-02-01 – 2022-02-02 (×5): 1 [IU] via SUBCUTANEOUS

## 2022-01-16 MED ORDER — ONDANSETRON HCL 4 MG PO TABS
4.0000 mg | ORAL_TABLET | Freq: Four times a day (QID) | ORAL | Status: DC | PRN
  Administered 2022-01-23: 4 mg via ORAL
  Filled 2022-01-16: qty 1

## 2022-01-16 MED ORDER — ACETAMINOPHEN 325 MG PO TABS
650.0000 mg | ORAL_TABLET | Freq: Four times a day (QID) | ORAL | Status: DC | PRN
  Administered 2022-01-17 – 2022-01-22 (×7): 650 mg via ORAL
  Filled 2022-01-16 (×7): qty 2

## 2022-01-16 MED ORDER — SODIUM ZIRCONIUM CYCLOSILICATE 10 G PO PACK
10.0000 g | PACK | Freq: Once | ORAL | Status: AC
  Administered 2022-01-17: 10 g via ORAL
  Filled 2022-01-16: qty 1

## 2022-01-16 MED ORDER — ACETAMINOPHEN 650 MG RE SUPP: 650.0000 mg | Freq: Four times a day (QID) | RECTAL | Status: DC | PRN

## 2022-01-16 NOTE — Assessment & Plan Note (Addendum)
Sensitive SSI Q4H for the moment. 

## 2022-01-16 NOTE — Assessment & Plan Note (Signed)
Due to volume overload in setting of AKF. Received IV Lasix with only 250 mL of urine recorded. -Going for dialysis today

## 2022-01-16 NOTE — Assessment & Plan Note (Addendum)
HGB 7.2 on DC 10/17.  7.9 this morning. -Continue to monitor -Feels if below 7

## 2022-01-16 NOTE — Assessment & Plan Note (Addendum)
Most likely secondary to renal failure.  Potassium improved to 5.5 after medical management and Lokelma. Will be started on dialysis. -Monitor potassium

## 2022-01-16 NOTE — Assessment & Plan Note (Addendum)
Poor UOP following lasix at OSH earlier today, with AKF as above. 1. Nephrology consult as above

## 2022-01-16 NOTE — Assessment & Plan Note (Deleted)
AKF on CKD4, discharge creat 2.5 earlier this month, required 3 sessions of dialysis during that admission, was 7.x at OSH earlier today with BUN 106. Pt volume overloaded with pulmonary edema on CXR earlier today. 1. Got '80mg'$  lasix at OSH earlier today 2. Repeat CMP stat 3. Needs nephrology consult

## 2022-01-16 NOTE — Assessment & Plan Note (Signed)
Mild hypotension with SBPs 80s-100s at OSH on presentation. 1. Hold all home BP meds for the moment

## 2022-01-16 NOTE — Assessment & Plan Note (Addendum)
AKF on CKD4, discharge creat 2.5 earlier this month, required 3 sessions of dialysis during that admission, was 7.x at OSH earlier today with BUN 106. Pt volume overloaded with pulmonary edema on CXR earlier today. Clinically exam findings worrisome for uremia with asterixis. Tunneled catheter was placed by IR today.  She will be started on dialysis. Most likely ESRD now. -Nephrology is on board-appreciate their help

## 2022-01-16 NOTE — Assessment & Plan Note (Signed)
Not grossly infected at this point. Wound care consult during stay.

## 2022-01-16 NOTE — Assessment & Plan Note (Signed)
Pt with asterixis, stuttering speech and slowed mentation.  H/o same in past due to uremia according to nephro notes.  Suspicious that she once again is having symptoms of uremia here. -Will be started on dialysis today

## 2022-01-17 ENCOUNTER — Inpatient Hospital Stay (HOSPITAL_COMMUNITY): Payer: Medicare Other

## 2022-01-17 ENCOUNTER — Other Ambulatory Visit: Payer: Self-pay

## 2022-01-17 DIAGNOSIS — E1159 Type 2 diabetes mellitus with other circulatory complications: Secondary | ICD-10-CM

## 2022-01-17 DIAGNOSIS — D638 Anemia in other chronic diseases classified elsewhere: Secondary | ICD-10-CM | POA: Diagnosis not present

## 2022-01-17 DIAGNOSIS — L511 Stevens-Johnson syndrome: Secondary | ICD-10-CM

## 2022-01-17 DIAGNOSIS — E875 Hyperkalemia: Secondary | ICD-10-CM | POA: Diagnosis not present

## 2022-01-17 DIAGNOSIS — Z794 Long term (current) use of insulin: Secondary | ICD-10-CM

## 2022-01-17 DIAGNOSIS — N184 Chronic kidney disease, stage 4 (severe): Secondary | ICD-10-CM

## 2022-01-17 DIAGNOSIS — N19 Unspecified kidney failure: Secondary | ICD-10-CM

## 2022-01-17 DIAGNOSIS — G9349 Other encephalopathy: Secondary | ICD-10-CM

## 2022-01-17 DIAGNOSIS — N179 Acute kidney failure, unspecified: Secondary | ICD-10-CM

## 2022-01-17 DIAGNOSIS — S91301A Unspecified open wound, right foot, initial encounter: Secondary | ICD-10-CM

## 2022-01-17 DIAGNOSIS — I5033 Acute on chronic diastolic (congestive) heart failure: Secondary | ICD-10-CM

## 2022-01-17 DIAGNOSIS — I152 Hypertension secondary to endocrine disorders: Secondary | ICD-10-CM

## 2022-01-17 DIAGNOSIS — E119 Type 2 diabetes mellitus without complications: Secondary | ICD-10-CM

## 2022-01-17 HISTORY — PX: IR FLUORO GUIDE CV LINE RIGHT: IMG2283

## 2022-01-17 HISTORY — PX: IR US GUIDE VASC ACCESS RIGHT: IMG2390

## 2022-01-17 LAB — PROTIME-INR
INR: 1.6 — ABNORMAL HIGH (ref 0.8–1.2)
Prothrombin Time: 18.8 seconds — ABNORMAL HIGH (ref 11.4–15.2)

## 2022-01-17 LAB — GLUCOSE, CAPILLARY
Glucose-Capillary: 164 mg/dL — ABNORMAL HIGH (ref 70–99)
Glucose-Capillary: 145 mg/dL — ABNORMAL HIGH (ref 70–99)
Glucose-Capillary: 141 mg/dL — ABNORMAL HIGH (ref 70–99)
Glucose-Capillary: 158 mg/dL — ABNORMAL HIGH (ref 70–99)
Glucose-Capillary: 161 mg/dL — ABNORMAL HIGH (ref 70–99)

## 2022-01-17 LAB — BRAIN NATRIURETIC PEPTIDE: B Natriuretic Peptide: 2085.2 pg/mL — ABNORMAL HIGH (ref 0.0–100.0)

## 2022-01-17 LAB — COMPREHENSIVE METABOLIC PANEL
ALT: 28 U/L (ref 0–44)
AST: 38 U/L (ref 15–41)
Albumin: 2.4 g/dL — ABNORMAL LOW (ref 3.5–5.0)
Alkaline Phosphatase: 66 U/L (ref 38–126)
Anion gap: 12 (ref 5–15)
BUN: 116 mg/dL — ABNORMAL HIGH (ref 6–20)
CO2: 23 mmol/L (ref 22–32)
Calcium: 8.8 mg/dL — ABNORMAL LOW (ref 8.9–10.3)
Chloride: 103 mmol/L (ref 98–111)
Creatinine, Ser: 8.43 mg/dL — ABNORMAL HIGH (ref 0.44–1.00)
GFR, Estimated: 5 mL/min — ABNORMAL LOW (ref >60)
Glucose, Bld: 155 mg/dL — ABNORMAL HIGH (ref 70–99)
Potassium: 6.4 mmol/L (ref 3.5–5.1)
Sodium: 138 mmol/L (ref 135–145)
Total Bilirubin: 1.6 mg/dL — ABNORMAL HIGH (ref 0.3–1.2)
Total Protein: 7.1 g/dL (ref 6.5–8.1)

## 2022-01-17 LAB — URINALYSIS, COMPLETE (UACMP) WITH MICROSCOPIC
Bilirubin Urine: NEGATIVE
Glucose, UA: NEGATIVE mg/dL
Ketones, ur: NEGATIVE mg/dL
Nitrite: NEGATIVE
Protein, ur: 100 mg/dL — AB
RBC / HPF: >50 RBC/hpf — ABNORMAL HIGH (ref 0–5)
Specific Gravity, Urine: 1.014 (ref 1.005–1.030)
pH: 5 (ref 5.0–8.0)

## 2022-01-17 LAB — MAGNESIUM: Magnesium: 2.2 mg/dL (ref 1.7–2.4)

## 2022-01-17 LAB — LACTIC ACID, PLASMA: Lactic Acid, Venous: 1.9 mmol/L (ref 0.5–1.9)

## 2022-01-17 LAB — HEPATITIS B SURFACE ANTIGEN: Hepatitis B Surface Ag: NONREACTIVE

## 2022-01-17 LAB — CBC
HCT: 24.7 % — ABNORMAL LOW (ref 36.0–46.0)
Hemoglobin: 7.9 g/dL — ABNORMAL LOW (ref 12.0–15.0)
MCH: 27.1 pg (ref 26.0–34.0)
MCHC: 32 g/dL (ref 30.0–36.0)
MCV: 84.6 fL (ref 80.0–100.0)
Platelets: 195 10*3/uL (ref 150–400)
RBC: 2.92 MIL/uL — ABNORMAL LOW (ref 3.87–5.11)
RDW: 18.1 % — ABNORMAL HIGH (ref 11.5–15.5)
WBC: 8 10*3/uL (ref 4.0–10.5)
nRBC: 0.5 % — ABNORMAL HIGH (ref 0.0–0.2)

## 2022-01-17 LAB — BASIC METABOLIC PANEL
Anion gap: 14 (ref 5–15)
BUN: 115 mg/dL — ABNORMAL HIGH (ref 6–20)
CO2: 22 mmol/L (ref 22–32)
Calcium: 8.3 mg/dL — ABNORMAL LOW (ref 8.9–10.3)
Chloride: 98 mmol/L (ref 98–111)
Creatinine, Ser: 8.15 mg/dL — ABNORMAL HIGH (ref 0.44–1.00)
GFR, Estimated: 5 mL/min — ABNORMAL LOW (ref >60)
Glucose, Bld: 187 mg/dL — ABNORMAL HIGH (ref 70–99)
Potassium: 5.5 mmol/L — ABNORMAL HIGH (ref 3.5–5.1)
Sodium: 134 mmol/L — ABNORMAL LOW (ref 135–145)

## 2022-01-17 LAB — PHOSPHORUS: Phosphorus: 7.8 mg/dL — ABNORMAL HIGH (ref 2.5–4.6)

## 2022-01-17 LAB — POTASSIUM: Potassium: 5.6 mmol/L — ABNORMAL HIGH (ref 3.5–5.1)

## 2022-01-17 MED ORDER — HEPARIN SODIUM (PORCINE) 1000 UNIT/ML IJ SOLN
INTRAMUSCULAR | Status: AC
  Administered 2022-01-17: 3.2 mL via INTRAVENOUS
  Filled 2022-01-17: qty 10

## 2022-01-17 MED ORDER — SODIUM BICARBONATE 8.4 % IV SOLN
100.0000 meq | Freq: Once | INTRAVENOUS | Status: AC
  Administered 2022-01-17: 100 meq via INTRAVENOUS
  Filled 2022-01-17: qty 50

## 2022-01-17 MED ORDER — SODIUM ZIRCONIUM CYCLOSILICATE 10 G PO PACK
10.0000 g | PACK | Freq: Once | ORAL | Status: AC
  Administered 2022-01-17: 10 g via ORAL
  Filled 2022-01-17: qty 1

## 2022-01-17 MED ORDER — MIDODRINE HCL 5 MG PO TABS
5.0000 mg | ORAL_TABLET | Freq: Three times a day (TID) | ORAL | Status: DC
  Administered 2022-01-17 – 2022-01-27 (×29): 5 mg via ORAL
  Filled 2022-01-17 (×29): qty 1

## 2022-01-17 MED ORDER — LIDOCAINE HCL 1 % IJ SOLN
INTRAMUSCULAR | Status: AC
  Administered 2022-01-17: 5 mL
  Filled 2022-01-17: qty 20

## 2022-01-17 MED ORDER — CHLORHEXIDINE GLUCONATE CLOTH 2 % EX PADS
6.0000 | MEDICATED_PAD | Freq: Every day | CUTANEOUS | Status: DC
  Administered 2022-01-18 – 2022-02-03 (×17): 6 via TOPICAL

## 2022-01-17 MED ORDER — CEFAZOLIN IN SODIUM CHLORIDE 3-0.9 GM/100ML-% IV SOLN
3.0000 g | INTRAVENOUS | Status: AC
  Filled 2022-01-17: qty 100

## 2022-01-17 MED ORDER — CEFAZOLIN SODIUM-DEXTROSE 2-4 GM/100ML-% IV SOLN
INTRAVENOUS | Status: AC
  Filled 2022-01-17: qty 100

## 2022-01-17 MED ORDER — INSULIN ASPART 100 UNIT/ML IV SOLN
5.0000 [IU] | Freq: Once | INTRAVENOUS | Status: AC
  Administered 2022-01-17: 5 [IU] via INTRAVENOUS

## 2022-01-17 MED ORDER — CHLORHEXIDINE GLUCONATE CLOTH 2 % EX PADS
6.0000 | MEDICATED_PAD | Freq: Every day | CUTANEOUS | Status: DC
  Administered 2022-01-18 – 2022-01-22 (×5): 6 via TOPICAL

## 2022-01-17 MED ORDER — KETOROLAC TROMETHAMINE 15 MG/ML IJ SOLN
15.0000 mg | Freq: Once | INTRAMUSCULAR | Status: AC
  Administered 2022-01-17: 15 mg via INTRAVENOUS
  Filled 2022-01-17: qty 1

## 2022-01-17 MED ORDER — DEXTROSE 50 % IV SOLN
1.0000 | Freq: Once | INTRAVENOUS | Status: AC
  Administered 2022-01-17: 50 mL via INTRAVENOUS
  Filled 2022-01-17: qty 50
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
## 2022-01-17 NOTE — Progress Notes (Signed)
Referring Physician(s): [name]  Supervising Physician: Mir, Biochemist, clinical  Patient Status:  [name] - In-pt  Chief Complaint: Renal failure   Subjective: Pt familiar to IR team from tunneled HD cath placement on 12/30/21 followed by removal on 10/17 due to renal recovery; now with weakness/confusion/encephalopathy and progression to ESRD. Request received for new tunneled HD cath placement. Additional hx includes HTN, CHF, anemia, CAD, depression, gout, obesity,HLD, OSA.    Allergies: Bactrim [sulfamethoxazole-trimethoprim], Diphenhydramine-acetaminophen, Benadryl [diphenhydramine], Cephalosporins, Ms contin [morphine], Tylenol pm extra [diphenhydramine-apap (sleep)], Valium [diazepam], Zocor [simvastatin], Vancomycin, Ceftriaxone, Cipro [ciprofloxacin hcl], Latex, and Levaquin [levofloxacin]  Medications: Prior to Admission medications   Medication Sig Start Date End Date Taking? Authorizing Provider  albuterol (VENTOLIN HFA) 108 (90 Base) MCG/ACT inhaler Inhale 2 puffs into the lungs every 6 (six) hours as needed for wheezing or shortness of breath. 02/01/17   [provider]  aspirin 81 MG chewable tablet Chew 81 mg by mouth daily.    [provider]  carvedilol (COREG) 25 MG tablet Take 0.5 tablets (12.5 mg total) by mouth 2 (two) times daily with a meal. Patient taking differently: Take 25 mg by mouth 2 (two) times daily. 11/05/21   Domenic Polite, MD  citalopram (CELEXA) 20 MG tablet Take 20 mg by mouth at bedtime.    [provider]  Coenzyme Q10 (COQ10) 100 MG CAPS Take 100 mg by mouth daily.    [provider]  cyclobenzaprine (FLEXERIL) 10 MG tablet Take 10 mg by mouth 3 (three) times daily as needed for muscle spasms.    [provider]  EFFIENT 10 MG TABS tablet TAKE 1 TABLET BY MOUTH DAILY 12/12/21   Larey Dresser, MD  Febuxostat (ULORIC) 80 MG TABS Take 1 tablet (80 mg total) by mouth every morning. NEEDS FOLLOW UP APPOINTMENT  FOR ANYMORE REFILLS 05/13/21   Larey Dresser, MD  fenofibrate 160 MG tablet Take 160 mg by mouth daily.    [provider]  gabapentin (NEURONTIN) 100 MG capsule Take 1 capsule (100 mg total) by mouth 2 (two) times daily. 01/06/22   Mariel Aloe, MD  hydrOXYzine (ATARAX) 25 MG tablet Take 1 tablet (25 mg total) by mouth 3 (three) times daily as needed for itching. 01/06/22   Mariel Aloe, MD  insulin aspart (NOVOLOG FLEXPEN) 100 UNIT/ML FlexPen Inject 8 Units into the skin 3 (three) times daily before meals. 01/06/22   Mariel Aloe, MD  Insulin Glargine (BASAGLAR KWIKPEN) 100 UNIT/ML Inject 25 Units into the skin 2 (two) times daily. 01/06/22   Mariel Aloe, MD  isosorbide mononitrate (IMDUR) 120 MG 24 hr tablet Take 1 tablet (120 mg total) by mouth every morning. NEEDS FOLLOW UP APPOINTMENT FOR ANYMORE REFILLS Patient taking differently: Take 120 mg by mouth daily. 10/06/21   Larey Dresser, MD  nitroGLYCERIN (NITROSTAT) 0.4 MG SL tablet Place 1 tablet (0.4 mg total) under the tongue every 5 (five) minutes as needed for chest pain. 05/14/11   Larey Dresser, MD  Oxycodone HCl 10 MG TABS Take 10 mg by mouth daily as needed.    [provider]  ranolazine (RANEXA) 500 MG 12 hr tablet Take 1 tablet (500 mg total) by mouth 2 (two) times daily. NEEDS FOLLOW UP APPOINTMENT FOR ANYMORE REFILLS Patient taking differently: Take 500 mg by mouth 2 (two) times daily. 10/06/21   Larey Dresser, MD  rosuvastatin (CRESTOR) 5 MG tablet TAKE ONE TABLET BY MOUTH ONCE DAILY FOR  CHOLESTEROL Patient taking differently: Take 5 mg by mouth daily. 06/08/14   Bensimhon, Shaune Pascal, MD  Torsemide 40 MG TABS Take 40 mg by mouth 2 (two) times daily.    [provider]     Vital Signs: BP (!) 95/51 (BP Location: Left Arm)   Pulse 63   Temp 98.9 F (37.2 C) (Axillary)   Resp 16   SpO2 99%   Physical Exam pt awake but lethargic; BP soft, family in room; chest- distant BS bilat;  heart- RRR; abd- obese, soft,+BS,NT; bilat LE edema  Imaging: DG CHEST PORT 1 VIEW  Result Date: 01/17/2022 CLINICAL DATA:  Pulmonary edema. EXAM: PORTABLE CHEST 1 VIEW COMPARISON:  01/31/2020. FINDINGS: The heart is enlarged in the mediastinal contour is within normal limits. There is atherosclerotic calcification of the aorta. The pulmonary vasculature is distended. Interstitial prominence is noted bilaterally. No effusion or pneumothorax. Sternotomy wires are present over the midline. IMPRESSION: 1. Cardiomegaly with pulmonary vascular congestion. 2. Interstitial prominence bilaterally, possible edema or infiltrate. Electronically Signed   By: Brett Fairy M.D.   On: 01/17/2022 00:48    Labs:  CBC: Recent Labs    01/05/22 0059 01/06/22 0046 01/16/22 2304 01/17/22 0347  WBC 6.3 5.8 8.1 8.0  HGB 7.6* 7.2* 8.6* 7.9*  HCT 24.6* 22.4* 27.1* 24.7*  PLT 123* 106* 204 195    COAGS: Recent Labs    10/25/21 0146 12/23/21 1552 12/28/21 0538  INR 1.3* 1.9* 1.6*    BMP: Recent Labs    01/05/22 0059 01/06/22 0046 01/16/22 2304 01/17/22 0347 01/17/22 0747  NA 134* 133* 138 134*  --   K 3.5 3.7 6.4* 5.5* 5.6*  CL 99 100 103 98  --   CO2 '26 26 23 22  '$ --   GLUCOSE 160* 142* 155* 187*  --   BUN 43* 44* 116* 115*  --   CALCIUM 8.4* 8.4* 8.8* 8.3*  --   CREATININE 2.43* 2.49* 8.43* 8.15*  --   GFRNONAA 23* 22* 5* 5*  --     LIVER FUNCTION TESTS: Recent Labs    10/26/21 0248 11/02/21 0500 12/17/21 0820 12/19/21 1106 12/23/21 1552 12/24/21 0653 12/30/21 1829 12/31/21 0429 01/04/22 0101 01/05/22 0059 01/06/22 0046 01/16/22 2304  BILITOT 0.6  --  1.2  --  0.8  --  0.9  --   --   --   --  1.6*  AST 32  --  49*  --   --   --  27  --   --   --   --  38  ALT 20  --  62*  --   --   --  21  --   --   --   --  28  ALKPHOS 35*  --  31*  --   --   --  30*  --   --   --   --  66  PROT 6.3*  --  7.7  --   --   --  6.9  --   --   --   --  7.1  ALBUMIN 2.8*   < > 2.8*   < >  --     < > 2.0*  2.0*   < > 2.0* 2.1* 2.0* 2.4*   < > = values in this interval not displayed.    Assessment and Plan: Pt familiar to IR team from tunneled HD cath placement on 12/30/21 followed by removal on 10/17 due  to renal recovery; now with weakness/confusion/encephalopathy and progression to ESRD. Request received for new tunneled HD cath placement. Additional hx includes HTN, CHF, anemia, CAD, depression, gout, obesity,HLD, OSA.Details/risks of procedure, incl but not limited to, internal bleeding, infection, injury to adjacent structures d/w pt's son/family with their understanding and consent. Procedure scheduled for today.    Electronically Signed: D. Rowe Robert, PA-C 01/17/2022, 10:24 AM   I spent a total of 20 minutes at the the patient's bedside AND on the patient's [name] floor or unit, greater than 50% of which was counseling/coordinating care for hemodialysis catheter placement   Patient ID: [name], female   DOB: [birth year], 56 y.o.   MRN: [id number]

## 2022-01-17 NOTE — Hospital Course (Signed)
Taken from H&P.   Melissia Lahman is a 56 y.o. female with medical history significant of dCHF, CKD 4-5, DM2, HTN, morbid obesity presented to ED with altered mental status, lethargy and generalized weakness.  Pt with recent admission from 9/27-10/17 for right foot ulcer with concern for infection. Empiric antibiotics initiated. Podiatry and vascular surgery consulted, recommending local wound care. Hospitalization complicated by development of rash, likely related to antibiotics in addition to worsening AKI leading to initiation of hemodialysis.  Ultimately rash improving following stopping / changing of ABx, patient able to be weaned off of dialysis.  Discharge creat of 2.5 on 10/17.  She was found to have AKI with creatinine at 7.7, BUN 109 and potassium of 6.4.  Patient received calcium gluconate, IV Lasix, insulin and dextrose with some improvement of potassium.  Nephrology was consulted and patient had her permanent tunneled catheter placed with IR today and will be started on dialysis.  Most likely ESRD now.  10/29: Patient had her tunneled catheter placed yesterday with first session of dialysis, no ultrafiltrate.  She was also started on midodrine for softer blood pressure.  Most likely dialysis dependent now.  Patient still very hopeful that God will heal her but agrees to continue with dialysis. Much improved mentation today.

## 2022-01-17 NOTE — Assessment & Plan Note (Signed)
Patient with rash last admission following ABx (Vanc, Rocephin, flagyl) followed by subsequent desquamation of skin predominately on hands and soles of feet.  Also had fever whos cause wasn't ever really identified. H/o skin peeling reaction to bactrim as well. Sounds a heck of a lot like she just had SJS, probably to vancomycin to me this past month. 1. Ultimately ABx switched during last admit 2. Skin rash significantly improved / definitely no worse since discharge per patient. 3. Not sure how much the drug reaction that I now suspect was SJS contributed to her AKI during last admit, but I doubt its ongoing acutely at this time. 4. Went ahead and added vancomycin to allergies list with my SJS suspicions.

## 2022-01-17 NOTE — Procedures (Signed)
Interventional Radiology Procedure Note  Procedure: Tunneled HD Catheter  Indication: Renal Failure  Findings:  Catheter tip is at the cavo-atrial junction. Catheter is ready for sure. Please refer to procedural dictation for full description.  Complications: None  EBL: < 10 mL  Miachel Roux, MD [phone]

## 2022-01-17 NOTE — Sedation Documentation (Signed)
Non sedation case.  Due to low BP
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
## 2022-01-17 NOTE — Progress Notes (Signed)
Repeat K is 6.4,  spoke again with Dr. Royce Macadamia: 2 amps of bicarb Lokelma 10g now and again in 6h Insulin and dextrose Check K Q4H

## 2022-01-17 NOTE — Progress Notes (Signed)
Progress Note   Patient: [name] EUM:[id number] DOB: 1965/08/06 DOA: 01/16/2022     1 DOS: the patient was seen and examined on 01/17/2022   Brief hospital course: Taken from H&P.   [name] is a 56 y.o. female with medical history significant of dCHF, CKD 4-5, DM2, HTN, morbid obesity presented to ED with altered mental status, lethargy and generalized weakness.  Pt with recent admission from 9/27-10/17 for right foot ulcer with concern for infection. Empiric antibiotics initiated. Podiatry and vascular surgery consulted, recommending local wound care. Hospitalization complicated by development of rash, likely related to antibiotics in addition to worsening AKI leading to initiation of hemodialysis.  Ultimately rash improving following stopping / changing of ABx, patient able to be weaned off of dialysis.  Discharge creat of 2.5 on 10/17.  She was found to have AKI with creatinine at 7.7, BUN 109 and potassium of 6.4.  Patient received calcium gluconate, IV Lasix, insulin and dextrose with some improvement of potassium.  Nephrology was consulted and patient had her permanent tunneled catheter placed with IR today and will be started on dialysis.  Most likely ESRD now.     Assessment and Plan: * Acute renal failure superimposed on stage 4 chronic kidney disease (HCC) AKF on CKD4, discharge creat 2.5 earlier this month, required 3 sessions of dialysis during that admission, was 7.x at OSH earlier today with BUN 106. Pt volume overloaded with pulmonary edema on CXR earlier today. Clinically exam findings worrisome for uremia with asterixis. Tunneled catheter was placed by IR today.  She will be started on dialysis. Most likely ESRD now. -Nephrology is on board-appreciate their help  Hyperkalemia, diminished renal excretion Most likely secondary to renal failure.  Potassium improved to 5.5 after medical management and Lokelma. Will be started on dialysis. -Monitor  potassium  Acute on chronic heart failure with preserved ejection fraction (HFpEF) ([place]) Due to volume overload in setting of AKF. Received IV Lasix with only 250 mL of urine recorded. -Going for dialysis today  Uremic encephalopathy Pt with asterixis, stuttering speech and slowed mentation.  H/o same in past due to uremia according to nephro notes.  Suspicious that she once again is having symptoms of uremia here. -Will be started on dialysis today  Hypertension associated with diabetes ([place]) Intermittently softer blood pressure. -Ordered 500 cc bolus -Holding all home BP meds for the moment  Insulin dependent type 2 diabetes mellitus (HCC) CBG within goal -Sensitive SSI   Anemia of chronic disease HGB 7.2 on DC 10/17.  7.9 this morning. -Continue to monitor -Feels if below 7  Wound of right foot Not grossly infected at this point. Wound care consult during stay.  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  Also had fever whos cause wasn't ever really identified. H/o skin peeling reaction to bactrim as well. Sounds a heck of a lot like she just had SJS, probably to vancomycin to me this past month. 1. Ultimately ABx switched during last admit 2. Skin rash significantly improved / definitely no worse since discharge per patient. 3. Not sure how much the drug reaction that I now suspect was SJS contributed to her AKI during last admit, but I doubt its ongoing acutely at this time. 4. Went ahead and added vancomycin to allergies list with my SJS suspicions.  Cardiorenal syndrome Poor UOP following lasix at OSH earlier today, with AKF  as above. 5. Nephrology consult as above        Subjective: Patient was seen and examined today.  Still having very slow and disoriented response.  Some abnormal jerking noted which is new per  sister present at bedside.  No nausea or vomiting.  Waiting for tunneled catheter placement  Physical Exam: Vitals:   01/17/22 0800 01/17/22 1141 01/17/22 1422 01/17/22 1432  BP: (!) 95/51 (!) 104/57 (!) 98/53 (!) 99/40  Pulse: 63 64 62 64  Resp: '16 14 14 14  '$ Temp: 98.9 F (37.2 C) 98.3 F (36.8 C)    TempSrc: Axillary Axillary    SpO2: 99% 100% 100% 97%   General.  Morbidly obese lady, in no acute distress. Pulmonary.  Lungs clear bilaterally, normal respiratory effort. CV.  Regular rate and rhythm, no JVD, rub or murmur. Abdomen.  Soft, nontender, nondistended, BS positive. CNS.  Alert and oriented .  No focal neurologic deficit. Extremities.  Bilateral lower extremities wrapped, history of chronic lymphedema and signs of chronic venous congestion. Psychiatry.  Judgment and insight appears impaired.  Data Reviewed: Prior data reviewed  Family Communication: Discussed with aunt and a cousin at bedside  Disposition: Status is: Inpatient Remains inpatient appropriate because: Severity of illness   Planned Discharge Destination: To be determined  DVT prophylaxis.  Subcu heparin Time spent: 50 minutes  This record has been created using Systems analyst. Errors have been sought and corrected,but may not always be located. Such creation errors do not reflect on the standard of care.  Author: Lorella Nimrod, MD 01/17/2022 3:00 PM  For on call review [email or web address].

## 2022-01-18 DIAGNOSIS — N179 Acute kidney failure, unspecified: Secondary | ICD-10-CM | POA: Diagnosis not present

## 2022-01-18 DIAGNOSIS — N184 Chronic kidney disease, stage 4 (severe): Secondary | ICD-10-CM | POA: Diagnosis not present

## 2022-01-18 LAB — RENAL FUNCTION PANEL
Albumin: 2.1 g/dL — ABNORMAL LOW (ref 3.5–5.0)
Anion gap: 15 (ref 5–15)
BUN: 111 mg/dL — ABNORMAL HIGH (ref 6–20)
CO2: 23 mmol/L (ref 22–32)
Calcium: 8 mg/dL — ABNORMAL LOW (ref 8.9–10.3)
Chloride: 99 mmol/L (ref 98–111)
Creatinine, Ser: 7.88 mg/dL — ABNORMAL HIGH (ref 0.44–1.00)
GFR, Estimated: 6 mL/min — ABNORMAL LOW (ref >60)
Glucose, Bld: 98 mg/dL (ref 70–99)
Phosphorus: 6.6 mg/dL — ABNORMAL HIGH (ref 2.5–4.6)
Potassium: 4.8 mmol/L (ref 3.5–5.1)
Sodium: 137 mmol/L (ref 135–145)

## 2022-01-18 LAB — GLUCOSE, CAPILLARY
Glucose-Capillary: 113 mg/dL — ABNORMAL HIGH (ref 70–99)
Glucose-Capillary: 120 mg/dL — ABNORMAL HIGH (ref 70–99)
Glucose-Capillary: 101 mg/dL — ABNORMAL HIGH (ref 70–99)
Glucose-Capillary: 91 mg/dL (ref 70–99)
Glucose-Capillary: 128 mg/dL — ABNORMAL HIGH (ref 70–99)
Glucose-Capillary: 116 mg/dL — ABNORMAL HIGH (ref 70–99)

## 2022-01-18 LAB — HEPATITIS B SURFACE ANTIBODY, QUANTITATIVE: Hep B S AB Quant (Post): <3.1 m[IU]/mL — ABNORMAL LOW (ref >9.9)

## 2022-01-18 MED ORDER — HEPARIN SODIUM (PORCINE) 1000 UNIT/ML IJ SOLN
INTRAMUSCULAR | Status: AC
  Filled 2022-01-18: qty 4

## 2022-01-18 MED ORDER — HYDROCERIN EX CREA
TOPICAL_CREAM | Freq: Every day | CUTANEOUS | Status: DC
  Filled 2022-01-18: qty 113

## 2022-01-18 MED ORDER — DARBEPOETIN ALFA 40 MCG/0.4ML IJ SOSY
40.0000 ug | PREFILLED_SYRINGE | INTRAMUSCULAR | Status: DC
  Administered 2022-01-18 – 2022-02-01 (×3): 40 ug via SUBCUTANEOUS
  Filled 2022-01-18 (×3): qty 0.4

## 2022-01-18 NOTE — Progress Notes (Signed)
Progress Note   Patient: [name] OZD:[id number] DOB: 21-Sep-1965 DOA: 01/16/2022     2 DOS: the patient was seen and examined on 01/18/2022   Brief hospital course: Taken from H&P.   [name] is a 56 y.o. female with medical history significant of dCHF, CKD 4-5, DM2, HTN, morbid obesity presented to ED with altered mental status, lethargy and generalized weakness.  Pt with recent admission from 9/27-10/17 for right foot ulcer with concern for infection. Empiric antibiotics initiated. Podiatry and vascular surgery consulted, recommending local wound care. Hospitalization complicated by development of rash, likely related to antibiotics in addition to worsening AKI leading to initiation of hemodialysis.  Ultimately rash improving following stopping / changing of ABx, patient able to be weaned off of dialysis.  Discharge creat of 2.5 on 10/17.  She was found to have AKI with creatinine at 7.7, BUN 109 and potassium of 6.4.  Patient received calcium gluconate, IV Lasix, insulin and dextrose with some improvement of potassium.  Nephrology was consulted and patient had her permanent tunneled catheter placed with IR today and will be started on dialysis.  Most likely ESRD now.  10/29: Patient had her tunneled catheter placed yesterday with first session of dialysis, no ultrafiltrate.  She was also started on midodrine for softer blood pressure.  Most likely dialysis dependent now.  Patient still very hopeful that God will heal her but agrees to continue with dialysis. Much improved mentation today.   Assessment and Plan: * Acute renal failure superimposed on stage 4 chronic kidney disease (HCC) Likely ESRD now AKF on CKD4, discharge creat 2.5 earlier this month, required 3 sessions of dialysis during that admission, was 7.x at OSH earlier today with BUN 106. Pt volume overloaded with pulmonary edema on CXR earlier today. Clinically exam findings worrisome for uremia with  asterixis. Tunneled catheter was placed by IR yesterday and she received her first session of dialysis. -Patient will need fistula placement -Nephrology is on board-appreciate their help  Hyperkalemia, diminished renal excretion Resolved Most likely secondary to renal failure.  -Monitor potassium  Acute on chronic heart failure with preserved ejection fraction (HFpEF) ([place]) Due to volume overload in setting of AKF. Received IV Lasix with only 250 mL of urine recorded. -Going for dialysis today  Uremic encephalopathy Improved with dialysis Pt with asterixis, stuttering speech and slowed mentation.  H/o same in past due to uremia according to nephro notes.  Suspicious that she once again is having symptoms of uremia here. -Continue with dialysis and monitoring  Hypertension associated with diabetes ([place]) Intermittently softer blood pressure, s/p 500 cc bolus yesterday and she was started on midodrine -Continue with midodrine -Holding all home BP meds for the moment  Insulin dependent type 2 diabetes mellitus (HCC) CBG within goal -Sensitive SSI   Anemia of chronic disease HGB 7.2 on DC 10/17.  7.9 this morning. -Continue to monitor -Feels if below 7  Wound of right foot Not grossly infected at this point. Wound care consult during stay.  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  Also had fever whos cause wasn't ever really identified. H/o skin peeling reaction to bactrim as well. Sounds a heck of a lot like she just had SJS, probably to vancomycin to me this past month. Ultimately ABx switched during last admit Skin rash significantly improved / definitely no worse since discharge per patient. Not sure how  much the drug reaction that I now suspect was SJS contributed to her AKI during last admit, but I doubt its ongoing acutely  at this time. Went ahead and added vancomycin to allergies list with my SJS suspicions.  Cardiorenal syndrome Poor UOP following lasix at OSH earlier today, with AKF as above. Nephrology consult as above      Subjective: Patient was seen and examined today.  She was feeling much improved, able to answer questions appropriately.  She was very hopeful that God will heal her and she will not need lifelong dialysis.  Agrees to continue with dialysis at this time.  Physical Exam: Vitals:   01/18/22 0252 01/18/22 0500 01/18/22 0800 01/18/22 1200  BP: (!) 96/44 (!) 95/58 (!) 101/45 (!) 106/52  Pulse: 63 62 62 66  Resp: '15 18 12 17  '$ Temp: 97.8 F (36.6 C) 98 F (36.7 C) 97.7 F (36.5 C) 98.2 F (36.8 C)  TempSrc: Axillary Axillary Oral Oral  SpO2: 99% 100% 100% 94%   General.  Morbidly obese lady.  In no acute distress. Pulmonary.  Lungs clear bilaterally, normal respiratory effort. CV.  Regular rate and rhythm, no JVD, rub or murmur. Abdomen.  Soft, nontender, nondistended, BS positive. CNS.  Alert and oriented .  No focal neurologic deficit. Extremities.  No edema, no cyanosis, pulses intact and symmetrical. Psychiatry.  Judgment and insight appears normal.  Data Reviewed: Prior data reviewed  Family Communication: Daughter on phone  Disposition: Status is: Inpatient Remains inpatient appropriate because: Severity of illness   Planned Discharge Destination:  To be determined  DVT prophylaxis.  Subcu heparin Time spent: 45 minutes  This record has been created using Systems analyst. Errors have been sought and corrected,but may not always be located. Such creation errors do not reflect on the standard of care.  Author: Lorella Nimrod, MD 01/18/2022 2:00 PM  For on call review [email or web address].

## 2022-01-18 NOTE — Progress Notes (Signed)
[facility] Progress Note  Name: Cledith Kamiya MRN: [id number] DOB: [birth year]  Chief Complaint:  Weakness and confusion   Subjective:  She had tunneled catheter on 10/28 with IR and then first HD treatment on 10/28 overnight.  No UF.  Midodrine was started yesterday for hypotension as she woke up well enough to be able to take PO per nursing.  She got a dose of toradol per primary team and we discussed not redosing please.  She had 350 ml uop over 10/28 charted.   She states that "I hear what you're saying" that she will always need dialysis but that she believes "God is going to heal me and do what he has promised".  Spoke with her daughter, [name], on the phone regarding the same.  I discussed that she would need a fistula for dialysis and she is processing this.   Review of systems:  Denies shortness of breath or chest pain  Denies n/v ---------------- Background on consult:   Jerrilyn Messinger is a 56 y.o. female with a complex history including diabetes, hypertension, CAD s/p CABG, recurrent lower extremity wounds, OSA, and multiple recent episodes of AKI on CKD stage IV requiring dialysis with progressive CKD.  She presented to an outside hospital on 10/28 with generalized weakness and confusion.  Per consulting MD labs there with Cr 7.6, K 6.0, BUN 106.  She was temporized and transferred to [facility] for further evaluation.  Nephrology consulted for assistance with management.  We gave lokelma, bicarb, and insulin/dextrose overnight to further temporize.  Consulting MD noted mental slowing and signs worrisome for uremic encephalopathy.  Her aunt and cousin are at bedside and I called her son on speakerphone.  He and I discussed the risks/benefits/indications for dialysis and he does wish to proceed.  He has talked with his mother and with his sister (patient's daughter) about this as well.  She had hoped to avoid HD but would want it to stay alive.  Her aunt shares that  the patient lives alone and they hadn't seen her since discharge when they visited her this week.  The patient couldn't figure out how to drink a bottle of water or understand how to turn on a television with a remote.  She is normally a good historian regarding her medications, etc per their report and lives alone.  They state they were told that the diffuse skin peeling is related to a med - antibiotic perhaps.    Note that she was last seen by nephrology at [facility] on 10/17.  She had required dialysis that admission for acute kidney injury on CKD.  Per charting, she had declined HD at one point and then elected to proceed with a short-term trial of dialysis to decide if she would want to continue.  A tunneled dialysis catheter was placed on 12/30/2021 and then removed prior to her discharge on 10/17.  Per charting she expressed desire to follow-up closer to home in [facility] so she did not have follow-up with [facility].     Intake/Output Summary (Last 24 hours) at 01/18/2022 0939 Last data filed at 01/18/2022 0442 Gross per 24 hour  Intake --  Output 350 ml  Net -350 ml    Vitals:  Vitals:   01/18/22 0230 01/18/22 0252 01/18/22 0500 01/18/22 0800  BP:  (!) 96/44 (!) 95/58 (!) 101/45  Pulse:  63 62 62  Resp: (!) '9 15 18 12  '$ Temp:  97.8 F (36.6 C) 98 F (36.7 C)  97.7 F (36.5 C)  TempSrc:  Axillary Axillary Oral  SpO2:  99% 100% 100%     Physical Exam:  General:  adult female in bed in NAD at rest  HEENT: NCAT Eyes: EOMI sclera anicteric Neck: increased neck circumference; supple  Heart: S1S2 no rub Lungs: transmitted upper airway sounds  Abdomen: soft/obese habitus/nontender Extremities:  1+ edema. Lower extremity wounds  Skin: diffuse skin peeling on hands and legs Neuro: alert and oriented to person, location, year, and situation today Psych no anxiety or agitation; insight is impaired GU - foley catheter in place Access RIJ tunn catheter in place    Medications reviewed    Labs:     Latest Ref Rng & Units 01/17/2022    7:47 AM 01/17/2022    3:47 AM 01/16/2022   11:04 PM  BMP  Glucose 70 - 99 mg/dL  187  155   BUN 6 - 20 mg/dL  115  116   Creatinine 0.44 - 1.00 mg/dL  8.15  8.43   Sodium 135 - 145 mmol/L  134  138   Potassium 3.5 - 5.1 mmol/L 5.6  5.5  6.4   Chloride 98 - 111 mmol/L  98  103   CO2 22 - 32 mmol/L  22  23   Calcium 8.9 - 10.3 mg/dL  8.3  8.8      Assessment/Plan:   # New ESRD - AKI on CKD stage IV/V now with progression to ESRD.  Previously palliative care involved and she did ulimately start HD last admission - per charting she just had hoped she would never need it and then they hoped had she enough renal function to come off.  First HD on 10/28 after tunn catheter with IR   - HD on 10/30 for next treatment - I have retimed orders for tomorrow (instead of today) as her mental status has improved.  Spoke with on-call HD RN to let her know - Will need to consult HD SW on Monday - Will need an AVF and she is still processing all this - I have not yet consulted [facility] - await am labs and I have ordered daily renal panel  - Please avoid any NSAID's.  She got a dose of toradol on 10/28 - please do not redose   # Hyperkalemia - Thus far temporized - here received bicarb and lokelma in addition to prior agents  - Starting HD as above   - I changed her to a renal diabetic diet    # Confusion/encephalopathy - Concerning for uremia in the setting of CKD progression - now on HD   # Hypotension - Hx of HTN - avoid hypotension - would hold imdur and coreg - Continue midodrine 5 mg TID (new med this admit)   # Chronic diastolic CHF  - holding imdur and coreg   - optimize volume status with HD    # Anemia CKD  - iron deficiency - repleted earlier this mont per charting - Will start aranesp 40 mcg weekly on Sundays for now   # Urine leuks  - not convincing for UTI - per primary team    # Hyperphosphatemia  - await updated labs -  follow with HD  - Check intact PTH    Disposition - continue inpatient monitoring   Claudia Desanctis, MD 01/18/2022 10:23 AM

## 2022-01-18 NOTE — Plan of Care (Signed)
  Problem: Education: Goal: Knowledge of General Education information will improve Description Including pain rating scale, medication(s)/side effects and non-pharmacologic comfort measures Outcome: Progressing   

## 2022-01-18 NOTE — Assessment & Plan Note (Signed)
Likely ESRD now AKF on CKD4, discharge creat 2.5 earlier this month, required 3 sessions of dialysis during that admission, was 7.x at OSH earlier today with BUN 106. Pt volume overloaded with pulmonary edema on CXR earlier today. Clinically exam findings worrisome for uremia with asterixis. Tunneled catheter was placed by IR yesterday and she received her first session of dialysis. -Patient will need fistula placement -Nephrology is on board-appreciate their help

## 2022-01-18 NOTE — Assessment & Plan Note (Signed)
Intermittently softer blood pressure, s/p 500 cc bolus yesterday and she was started on midodrine -Continue with midodrine -Holding all home BP meds for the moment

## 2022-01-18 NOTE — Assessment & Plan Note (Signed)
Improved with dialysis Pt with asterixis, stuttering speech and slowed mentation.  H/o same in past due to uremia according to nephro notes.  Suspicious that she once again is having symptoms of uremia here. -Continue with dialysis and monitoring

## 2022-01-18 NOTE — Consult Note (Signed)
[facility] Nurse Consult Note: Reason for Consult:Patient known to our service from recent admissions, seen on 9/27 and 10/9 by my associates. Also seen by podiatric medicine and vascular surgery who have indicated that local wound care for LEs is indicated. Both Legs are with dry, flaking skin. The RLE has a small, partial thickness open area today. I will provide conservative guidance for the topical care of her LEs. Wound type:venous insufficiency Pressure Injury POA: N/A Measurement:RLE (Per flow Sheet): 2cm x 1.5cm x 0.1cm Wound bed:100% red, clean Drainage (amount, consistency, odor) none Periwound:intact, dry, flaking Dressing procedure/placement/frequency: I will provide Nursing with guidance for the care of the patient's LEs using a daily cleanse, pat dry and application of moisturizer (Eucerin cream) RLE wound will be covered with a folded layer of xeroform gauze topped with dry gauze. Both LEs are to be wrapped with Kerlix roll gauze and secured with paper tape. Feet are to be placed into Prevalon boots. A sacral silicone foam is to be placed for PI prevention.  The assistance of Bedside RN, M. Lamichhane is appreciated.  [facility] nursing team will not follow, but will remain available to this patient, the nursing and medical teams.  Please re-consult if needed.  Thank you for inviting [place] to participate in this patient's Plan of Care.  Maudie Flakes, MSN, RN, CNS, [place], Serita Grammes, [employer], [employer] phone:  [phone]

## 2022-01-18 NOTE — Assessment & Plan Note (Signed)
Resolved Most likely secondary to renal failure.  -Monitor potassium

## 2022-01-18 NOTE — Progress Notes (Signed)
Received patient in bed to unit.  Alert and oriented.  Informed consent signed and in chart.   Treatment initiated: 2306 Treatment completed: 0144  Patient tolerated well.  Transported back to the room  Alert, without acute distress.  Hand-off given to patient's nurse.   Access used: catheter Access issues: none  Total UF removed: 0 Medication(s) given: none Post HD VS: 97.2 84/68 57 16 100% [place] Post HD weight: unable to obtain   [facility]

## 2022-01-19 ENCOUNTER — Encounter (INDEPENDENT_AMBULATORY_CARE_PROVIDER_SITE_OTHER): Payer: Self-pay

## 2022-01-19 DIAGNOSIS — N184 Chronic kidney disease, stage 4 (severe): Secondary | ICD-10-CM | POA: Diagnosis not present

## 2022-01-19 DIAGNOSIS — N179 Acute kidney failure, unspecified: Secondary | ICD-10-CM | POA: Diagnosis not present

## 2022-01-19 LAB — RENAL FUNCTION PANEL
Albumin: 2 g/dL — ABNORMAL LOW (ref 3.5–5.0)
Anion gap: 15 (ref 5–15)
BUN: 115 mg/dL — ABNORMAL HIGH (ref 6–20)
CO2: 23 mmol/L (ref 22–32)
Calcium: 8.2 mg/dL — ABNORMAL LOW (ref 8.9–10.3)
Chloride: 99 mmol/L (ref 98–111)
Creatinine, Ser: 8.14 mg/dL — ABNORMAL HIGH (ref 0.44–1.00)
GFR, Estimated: 5 mL/min — ABNORMAL LOW (ref >60)
Glucose, Bld: 115 mg/dL — ABNORMAL HIGH (ref 70–99)
Phosphorus: 6.6 mg/dL — ABNORMAL HIGH (ref 2.5–4.6)
Potassium: 4.6 mmol/L (ref 3.5–5.1)
Sodium: 137 mmol/L (ref 135–145)

## 2022-01-19 LAB — GLUCOSE, CAPILLARY
Glucose-Capillary: 125 mg/dL — ABNORMAL HIGH (ref 70–99)
Glucose-Capillary: 104 mg/dL — ABNORMAL HIGH (ref 70–99)
Glucose-Capillary: 117 mg/dL — ABNORMAL HIGH (ref 70–99)
Glucose-Capillary: 149 mg/dL — ABNORMAL HIGH (ref 70–99)
Glucose-Capillary: 174 mg/dL — ABNORMAL HIGH (ref 70–99)

## 2022-01-19 MED ORDER — HEPARIN SODIUM (PORCINE) 1000 UNIT/ML DIALYSIS
1000.0000 [IU] | INTRAMUSCULAR | Status: DC | PRN
  Filled 2022-01-19: qty 1

## 2022-01-19 MED ORDER — PENTAFLUOROPROP-TETRAFLUOROETH EX AERO: 1.0000 | INHALATION_SPRAY | CUTANEOUS | Status: DC | PRN

## 2022-01-19 MED ORDER — ALTEPLASE 2 MG IJ SOLR: 2.0000 mg | Freq: Once | INTRAMUSCULAR | Status: DC | PRN

## 2022-01-19 MED ORDER — LIDOCAINE-PRILOCAINE 2.5-2.5 % EX CREA: 1.0000 | TOPICAL_CREAM | CUTANEOUS | Status: DC | PRN

## 2022-01-19 MED ORDER — ANTICOAGULANT SODIUM CITRATE 4% (200MG/5ML) IV SOLN: 5.0000 mL | Status: DC | PRN

## 2022-01-19 MED ORDER — LIDOCAINE HCL (PF) 1 % IJ SOLN: 5.0000 mL | INTRAMUSCULAR | Status: DC | PRN

## 2022-01-19 MED ORDER — ALBUMIN HUMAN 25 % IV SOLN
INTRAVENOUS | Status: AC
  Administered 2022-01-19: 25 g
  Filled 2022-01-19: qty 100

## 2022-01-19 NOTE — Progress Notes (Signed)
Requested to see pt for out-pt HD needs at d/c. Met with pt at bedside. Pt gave permission for navigator to speak in front of family at bedside. Introduced self and explained role. Pt resides in [facility] and prefers clinic placement in that area. [facility] is the only clinic in this area. Pt agreeable to referral being made to [facility] admissions for [facility] clinic. Pt hopes that her kidneys will recover and she will not require HD at d/c. Explained to pt that should that happen, navigator can cancel referral. Pt drove prior to admission and hopes to be able to drive at d/c to HD appts. Family members do assist as able but work. Pt's son lives in [place] and daughter lives in [place]. Family as bedside inquiring about transportation options should they be needed. Contacted TOC staff with family's request for resources for transportation in the event pt needs assistance at d/c. Will assist as needed.   Melven Sartorius Renal Navigator [phone]

## 2022-01-19 NOTE — Care Management Important Message (Signed)
Important Message  Patient Details  Name: [name] MRN: [id number] Date of Birth: 1965/12/01   Medicare Important Message Given:  Yes     Mercedes Valeriano 01/19/2022, 4:20 PM

## 2022-01-19 NOTE — Progress Notes (Addendum)
Progress Note Patient: [name] TZG:[id number] DOB: 01-10-1966 DOA: 01/16/2022  DOS: the patient was seen and examined on 01/19/2022  Brief hospital course: PMH of CAD SP CABG, CKD 5/ESRD, chronic HFpEF, type II DM, OSA, obesity, recent hospitalization for acute metabolic encephalopathy due to AKI on CKD 4 getting HD.  Presented to the hospital with complaints of confusion and generalized weakness. Patient was discharged off of HD as her renal function improved.  Her original hospitalization was for a diabetic foot ulcer for which podiatry and vascular surgery were consulted.  Patient was on multiple antibiotics. Current plan is to continue HD and monitor renal function.   Assessment and Plan: Acute renal failure superimposed on stage 4 chronic kidney disease (HCC) Hyperkalemia, diminished renal excretion Presented to [facility] with Confusion and Lethargy and Generalized Weakness. Found to have worsening renal function with serum creatinine of 7.7 and BUN of 109. Potassium was 6.0.  Foley catheter was inserted.  Patient was brought to [facility].  Nephrology was consulted.  IR was consulted and tunneled HD catheter was placed on 10/28. Started on HD with improvement in mentation.  BUN remains elevated although potassium level improved. Most likely will require long-term HD.  Patient hopeful for recovery similar to last admission.  Acute on chronic heart failure with preserved ejection fraction (HFpEF) (HCC) Volume overloaded at the time of admission. Currently receiving HD.  Volume management per nephrology.  Appreciate assistance. On room air.  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 Mentation improved significantly but still not back to baseline.  Hypotension.  History of hypertension. Blood pressure soft. Requires midodrine 3 times daily support to continue HD.  Type of diabetes mellitus, uncontrolled with hyperglycemia with renal complication  with long-term insulin use.  Along with neuropathy Hemoglobin A1c 6.9 in August 2023. We will recheck tomorrow. Currently on sliding scale insulin.  Holding home dose of Basaglar 25 units for now On gabapentin currently on hold due to confusion.  Anemia of chronic disease Hemoglobin stable around 7. Monitor. Management per nephrology. No active bleeding. Patient actually had some thrombocytopenia during last admission which is a problem.  Obesity, morbid, BMI 50 or higher (HCC) OSA (obstructive sleep apnea) Continue CPAP nightly. Placing the patient at high risk of poor outcome.  Wound of right foot Diabetic foot ulcer (HCC) 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 daily cleanse, pat dry and application of moisturizer (Eucerin cream) RLE wound will be covered with a folded layer of xeroform gauze topped with dry gauze. Both LEs are to be wrapped with Kerlix roll gauze and secured with paper tape. Feet are to be placed into Prevalon boots. A sacral silicone foam is to be placed for PI prevention.  Rash with antibiotic At the time of admission there was some concern the patient may have suffered from Stevens-Johnson syndrome during last admission. I do not suspect that the prior admission patient suffered a drug rash secondary to Stevens-Johnson syndrome. Possibility of a dress like syndrome cannot be ruled out.  But her skin peeling is not typical for Nikolsky sign.  It is rather dry skin flaking off.  Foley catheter. Inserted at outside hospital prior to admission on 10/27. We will continue for now for close I and O  Subjective: No nausea no vomiting.  Does not report any chest pain or jaw pain which she had during last admission.  No fever no chills.  No diarrhea.  Tolerating oral diet.  Physical Exam: Vitals:  01/18/22 2315 01/19/22 0321 01/19/22 0755 01/19/22 1300  BP: 95/75 (!) 93/40 (!) 95/50 (!) 103/47  Pulse: 70 72 70 68  Resp: '16 20  14 '$ (!) 7  Temp: 97.8 F (36.6 C) 97.8 F (36.6 C) 98.6 F (37 C) 97.7 F (36.5 C)  TempSrc: Oral Oral Oral Oral  SpO2: 90% 91% 100% 100%   General: Appear in mild distress; no visible Abnormal Neck Mass Or lumps, Conjunctiva normal Cardiovascular: S1 and S2 Present, no Murmur, Respiratory: good respiratory effort, Bilateral Air entry present and CTA, no Crackles, no wheezes Abdomen: Bowel Sound present, Non tender  Extremities: bilateral Pedal edema Neurology: alert and oriented to time, place, and person, asterixis present Gait not checked due to patient safety concerns   Data Reviewed: I have Reviewed nursing notes, Vitals, and Lab results since pt's last encounter. Pertinent lab results CBC and BMP I have ordered test including BC and BMP    Family Communication: No one at bedside  Disposition: Status is: Inpatient Remains inpatient appropriate because: Need for HD  heparin injection 5,000 Units Start: 01/17/22 0600   Level of care: Progressive Telemetry reviewed, shows NSR and Continue Progressive due to AKI   Author: Berle Mull, MD 01/19/2022 5:11 PM Please look on [email or web address] to find out who is on call.

## 2022-01-19 NOTE — Progress Notes (Signed)
[facility] Progress Note   Assessment/ Plan:   # New ESRD - AKI on CKD stage IV/V now with progression to ESRD.  Previously palliative care involved and she did ulimately start HD last admission - per charting she just had hoped she would never need it and then they hoped had she enough renal function to come off.  First HD on 10/28 after tunn catheter with IR   - HD on 10/30 HD #2 -  CLIP needed - needs perm access- wait until MS better    # Hyperkalemia - resolved   # Confusion/encephalopathy - Concerning for uremia in the setting of CKD progression - now on HD   # Hypotension - Hx of HTN - avoid hypotension - would hold imdur and coreg - Continue midodrine 5 mg TID (new med this admit)   # Chronic diastolic CHF  - holding imdur and coreg   - optimize volume status with HD    # Anemia CKD  - iron deficiency - repleted earlier this mont per charting - Will start aranesp 40 mcg weekly on Sundays for now   # Urine leuks  - not convincing for UTI - per primary team    # Hyperphosphatemia  - await updated labs - follow with HD  - Check intact PTH    Disposition - CLIP in process  Subjective:    Still somewhat confused.  For HD today   Objective:   BP (!) 103/47 (BP Location: Left Arm)   Pulse 68   Temp 97.7 F (36.5 C) (Oral)   Resp (!) 7   SpO2 100%   Physical Exam: CWU:GQBVQ in bed, confused a little CVS: RRR Resp: clear Abd: soft Ext: 1+ LE edema ACCESS: R IJ [facility]  Labs: BMET Recent Labs  Lab 01/16/22 2304 01/17/22 0347 01/17/22 0747 01/18/22 0917 01/19/22 0308  NA 138 134*  --  137 137  K 6.4* 5.5* 5.6* 4.8 4.6  CL 103 98  --  99 99  CO2 23 22  --  23 23  GLUCOSE 155* 187*  --  98 115*  BUN 116* 115*  --  111* 115*  CREATININE 8.43* 8.15*  --  7.88* 8.14*  CALCIUM 8.8* 8.3*  --  8.0* 8.2*  PHOS 7.8*  --   --  6.6* 6.6*   CBC Recent Labs  Lab 01/16/22 2304 01/17/22 0347  WBC 8.1 8.0  NEUTROABS 5.0  --   HGB 8.6*  7.9*  HCT 27.1* 24.7*  MCV 86.0 84.6  PLT 204 195      Medications:     Chlorhexidine Gluconate Cloth  6 each Topical Q0600   Chlorhexidine Gluconate Cloth  6 each Topical Q0600   darbepoetin (ARANESP) injection - NON-DIALYSIS  40 mcg Subcutaneous Q Sun-1800   heparin  5,000 Units Subcutaneous Q8H   hydrocerin   Topical Daily   insulin aspart  0-9 Units Subcutaneous Q4H   midodrine  5 mg Oral TID WC     [name] MD 01/19/2022, 2:08 PM

## 2022-01-19 NOTE — Plan of Care (Signed)
  Problem: Education: Goal: Knowledge of General Education information will improve Description Including pain rating scale, medication(s)/side effects and non-pharmacologic comfort measures Outcome: Progressing   

## 2022-01-20 ENCOUNTER — Inpatient Hospital Stay (HOSPITAL_COMMUNITY): Payer: Medicare Other

## 2022-01-20 DIAGNOSIS — N179 Acute kidney failure, unspecified: Secondary | ICD-10-CM | POA: Diagnosis not present

## 2022-01-20 DIAGNOSIS — N184 Chronic kidney disease, stage 4 (severe): Secondary | ICD-10-CM | POA: Diagnosis not present

## 2022-01-20 LAB — GLUCOSE, CAPILLARY
Glucose-Capillary: 140 mg/dL — ABNORMAL HIGH (ref 70–99)
Glucose-Capillary: 140 mg/dL — ABNORMAL HIGH (ref 70–99)
Glucose-Capillary: 144 mg/dL — ABNORMAL HIGH (ref 70–99)
Glucose-Capillary: 149 mg/dL — ABNORMAL HIGH (ref 70–99)
Glucose-Capillary: 169 mg/dL — ABNORMAL HIGH (ref 70–99)
Glucose-Capillary: 173 mg/dL — ABNORMAL HIGH (ref 70–99)

## 2022-01-20 LAB — PARATHYROID HORMONE, INTACT (NO CA): PTH: 164 pg/mL — ABNORMAL HIGH (ref 15–65)

## 2022-01-20 MED ORDER — CYCLOBENZAPRINE HCL 5 MG PO TABS
5.0000 mg | ORAL_TABLET | Freq: Three times a day (TID) | ORAL | Status: DC
  Administered 2022-01-20 – 2022-02-02 (×33): 5 mg via ORAL
  Filled 2022-01-20 (×34): qty 1

## 2022-01-20 NOTE — Progress Notes (Signed)
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  Previously palliative care involved and she did ulimately start HD last admission - per charting she just had hoped she would never need it and then they hoped had she enough renal function to come off.  First HD on 10/28 after tunn catheter with IR   - HD on 10/30 HD #2, HD #3 11/1 -  CLIP needed- in process - needs perm access- discussed today now that MS better, have ordered vein mapping and will consult VVS in AM    # Hyperkalemia - resolved   # Confusion/encephalopathy - Concerning for uremia in the setting of CKD progression - now on HD   # Hypotension - Hx of HTN - avoid hypotension - would hold imdur and coreg - Continue midodrine 5 mg TID (new med this admit)   # Chronic diastolic CHF  - holding imdur and coreg   - optimize volume status with HD    # Anemia CKD  - iron deficiency - repleted earlier this mont per charting - Will start aranesp 40 mcg weekly on Sundays for now   # Urine leuks  - not convincing for UTI - per primary team    # Hyperphosphatemia  - await updated labs - follow with HD  - Check intact PTH    Disposition - CLIP in process  Subjective:    Had HD yesterday, tolerated well.  More alert today.  Brother (who is on dialysis) and son are at bedside.   Objective:   BP (!) 112/43 (BP Location: Left Arm)   Pulse 87   Temp 99.9 F (37.7 C) (Oral)   Resp 15   SpO2 98%   Physical Exam: VPX:TGGYI in bed, more awake and alert CVS: RRR Resp: clear Abd: soft Ext: 1+ LE edema ACCESS: R IJ [facility]  Labs: BMET Recent Labs  Lab 01/16/22 2304 01/17/22 0347 01/17/22 0747 01/18/22 0917 01/19/22 0308  NA 138 134*  --  137 137  K 6.4* 5.5* 5.6* 4.8 4.6  CL 103 98  --  99 99  CO2 23 22  --  23 23  GLUCOSE 155* 187*  --  98 115*  BUN 116* 115*  --  111* 115*  CREATININE 8.43* 8.15*  --  7.88* 8.14*  CALCIUM  8.8* 8.3*  --  8.0* 8.2*  PHOS 7.8*  --   --  6.6* 6.6*   CBC Recent Labs  Lab 01/16/22 2304 01/17/22 0347  WBC 8.1 8.0  NEUTROABS 5.0  --   HGB 8.6* 7.9*  HCT 27.1* 24.7*  MCV 86.0 84.6  PLT 204 195      Medications:     Chlorhexidine Gluconate Cloth  6 each Topical Q0600   Chlorhexidine Gluconate Cloth  6 each Topical Q0600   darbepoetin (ARANESP) injection - NON-DIALYSIS  40 mcg Subcutaneous Q Sun-1800   heparin  5,000 Units Subcutaneous Q8H   hydrocerin   Topical Daily   insulin aspart  0-9 Units Subcutaneous Q4H   midodrine  5 mg Oral TID WC     [name] MD 01/20/2022, 2:48 PM

## 2022-01-20 NOTE — Progress Notes (Signed)
Progress Note Patient: [name] XKG:[id number] DOB: May 11, 1965 DOA: 01/16/2022  DOS: the patient was seen and examined on 01/20/2022  Brief hospital course: PMH of CAD SP CABG, CKD 5/ESRD, chronic HFpEF, type II DM, OSA, obesity, recent hospitalization for acute metabolic encephalopathy due to AKI on CKD 4 getting HD.  Presented to the hospital with complaints of confusion and generalized weakness. Patient was discharged off of HD as her renal function improved.  Her original hospitalization was for a diabetic foot ulcer for which podiatry and vascular surgery were consulted.  Patient was on multiple antibiotics. Current plan is to continue HD and monitor renal function. Assessment and Plan: Acute renal failure superimposed on stage 4 chronic kidney disease (HCC) Hyperkalemia, diminished renal excretion Presented to [facility] with Confusion and Lethargy and Generalized Weakness. Found to have worsening renal function with serum creatinine of 7.7 and BUN of 109. Potassium was 6.0.  Foley catheter was inserted.  Patient was brought to [facility].  Nephrology was consulted.  IR was consulted and tunneled HD catheter was placed on 10/28. Started on HD with improvement in mentation.  BUN remains elevated although potassium level improved. Most likely will require long-term HD.  Patient hopeful for recovery similar to last admission. Diet change to cardiac diet per discussion with the patient. Agree with placement of aVF.   Acute on chronic heart failure with preserved ejection fraction (HFpEF) (HCC) Volume overloaded at the time of admission. Currently receiving HD.  Volume management per nephrology.  Appreciate assistance. On room air.   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 Mentation improved significantly but still not back to baseline.   Hypotension.  History of hypertension. Blood pressure soft. Requires midodrine 3 times daily support to continue  HD.   Type of diabetes mellitus, uncontrolled with hyperglycemia with renal complication with long-term insulin use.  Along with neuropathy Hemoglobin A1c 6.9 in August 2023. We will recheck tomorrow. Currently on sliding scale insulin.  Holding home dose of Basaglar 25 units for now On gabapentin currently on hold due to confusion.   Anemia of chronic disease Hemoglobin stable around 7. Monitor. Management per nephrology. No active bleeding. Patient actually had some thrombocytopenia during last admission which is a problem.   Obesity, morbid, BMI 50 or higher (HCC) OSA (obstructive sleep apnea) Continue CPAP nightly. Placing the patient at high risk of poor outcome.   Wound of right foot Diabetic foot ulcer (HCC) 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 daily cleanse, pat dry and application of moisturizer (Eucerin cream) RLE wound will be covered with a folded layer of xeroform gauze topped with dry gauze. Both LEs are to be wrapped with Kerlix roll gauze and secured with paper tape. Feet are to be placed into Prevalon boots. A sacral silicone foam is to be placed for PI prevention.   Rash with antibiotic At the time of admission there was some concern the patient may have suffered from Stevens-Johnson syndrome during last admission. I do not suspect that the prior admission patient suffered a drug rash secondary to Stevens-Johnson syndrome. Possibility of a dress like syndrome cannot be ruled out.  But her skin peeling is not typical for Nikolsky sign.  It is rather dry skin flaking off.   Foley catheter. Inserted at outside hospital prior to admission on 10/27. We will continue for now for close I and O  Abdominal tightness. No tenderness on examination. We will get x-ray abdomen.  Generalized body ache. Most  likely in the setting of uremia. Patient is on Flexeril 7.5 mg 3 times daily at home. We will resume for 5 mg 3 times  daily.  Subjective: No nausea no vomiting no fever no chills.  Reports generalized body ache.  Including chest pain abdominal pain.  Back pain and leg pain.  No nausea no vomiting.  No diarrhea.  Passing gas.  Physical Exam: Vitals:   01/20/22 0736 01/20/22 0813 01/20/22 1136 01/20/22 1548  BP: (!) 106/30 (!) 110/47 (!) 112/43 (!) 109/42  Pulse: 80 84 87 85  Resp: '10 20 15 20  '$ Temp: 99.8 F (37.7 C)  99.9 F (37.7 C) 99.5 F (37.5 C)  TempSrc: Oral  Oral Oral  SpO2: 100% 100% 98% 99%   General: Appear in mild distress; no visible Abnormal Neck Mass Or lumps, Conjunctiva normal Cardiovascular: S1 and S2 Present, no Murmur, Respiratory: good respiratory effort, Bilateral Air entry present and CTA, no Crackles, no wheezes Abdomen: Bowel Sound present, Non tender  Extremities: bilateral chronic  Pedal edema Neurology: alert and oriented to time, place, and person asterixis present. Gait not checked due to patient safety concerns   Data Reviewed: I have Reviewed nursing notes, Vitals, and Lab results since pt's last encounter. Pertinent lab results CBG I have ordered test including CBC and BMP I have ordered imaging studies x-ray abdomen.   Family Communication: No one at bedside  Disposition: Status is: Inpatient Remains inpatient appropriate because: Need for HD heparin injection 5,000 Units Start: 01/17/22 0600   Level of care: Progressive Author: Berle Mull, MD 01/20/2022 6:36 PM Please look on [email or web address] to find out who is on call.

## 2022-01-20 NOTE — TOC Progression Note (Signed)
Transition of Care [facility]) - Progression Note    Patient Details  Name: [name] MRN: [id number] Date of Birth: 04-04-1965  Transition of Care [facility]) CM/SW [place], [place] Work Phone Number: 01/20/2022, 1:49 PM  Clinical Narrative:    MSW intern reached out to [employer] in [place] and requested a quote for transportation to and from dialysis appointments. The transit stated they are able to pick up the patient from home and transport her to [facility]. It will cost the patient $4 each way and the patient has to schedule the transport 48 hours in advance. MSW intern asked if the transit company offered anything for dialysis patients. The company stated they offer something for disabled patients, but nothing specifically for dialysis patients.    Expected Discharge Plan: [place]    Expected Discharge Plan and Services Expected Discharge Plan: [place]                                               Social Determinants of Health (SDOH) Interventions    Readmission Risk Interventions    12/18/2021    2:02 PM 11/05/2021    1:58 PM  Readmission Risk Prevention Plan  Transportation Screening Complete Complete  PCP or Specialist Appt within 5-7 Days  Complete  PCP or Specialist Appt within 3-5 Days Complete   Home Care Screening  Complete  Medication Review (RN CM)  Complete  HRI or Home Care Consult Complete   Social Work Consult for [place] Planning/Counseling Complete   Palliative Care Screening Not Applicable   Medication Review Press photographer) Complete

## 2022-01-20 NOTE — Progress Notes (Signed)
Received patient in bed to unit.  Alert and oriented.  Informed consent signed and in chart.   Treatment initiated: 2100 Treatment completed: 0006  Patient tolerated well.  Transported back to the room  Alert, without acute distress.  Hand-off given to patient's nurse.   Access used: catheter Access issues: none  Total UF removed: 800 Medication(s) given: albumin 25 grams Post HD VS: 103/67, 77, 10  Post HD weight: 149 kg   [facility]

## 2022-01-21 DIAGNOSIS — N185 Chronic kidney disease, stage 5: Secondary | ICD-10-CM

## 2022-01-21 DIAGNOSIS — I2510 Atherosclerotic heart disease of native coronary artery without angina pectoris: Secondary | ICD-10-CM

## 2022-01-21 DIAGNOSIS — N179 Acute kidney failure, unspecified: Secondary | ICD-10-CM | POA: Diagnosis not present

## 2022-01-21 DIAGNOSIS — N184 Chronic kidney disease, stage 4 (severe): Secondary | ICD-10-CM | POA: Diagnosis not present

## 2022-01-21 LAB — RENAL FUNCTION PANEL
Albumin: 2 g/dL — ABNORMAL LOW (ref 3.5–5.0)
Anion gap: 11 (ref 5–15)
BUN: 75 mg/dL — ABNORMAL HIGH (ref 6–20)
CO2: 26 mmol/L (ref 22–32)
Calcium: 8.7 mg/dL — ABNORMAL LOW (ref 8.9–10.3)
Chloride: 100 mmol/L (ref 98–111)
Creatinine, Ser: 5.64 mg/dL — ABNORMAL HIGH (ref 0.44–1.00)
GFR, Estimated: 8 mL/min — ABNORMAL LOW (ref >60)
Glucose, Bld: 129 mg/dL — ABNORMAL HIGH (ref 70–99)
Phosphorus: 4.5 mg/dL (ref 2.5–4.6)
Potassium: 3.4 mmol/L — ABNORMAL LOW (ref 3.5–5.1)
Sodium: 137 mmol/L (ref 135–145)

## 2022-01-21 LAB — GLUCOSE, CAPILLARY
Glucose-Capillary: 136 mg/dL — ABNORMAL HIGH (ref 70–99)
Glucose-Capillary: 129 mg/dL — ABNORMAL HIGH (ref 70–99)
Glucose-Capillary: 89 mg/dL (ref 70–99)
Glucose-Capillary: 97 mg/dL (ref 70–99)
Glucose-Capillary: 98 mg/dL (ref 70–99)
Glucose-Capillary: 121 mg/dL — ABNORMAL HIGH (ref 70–99)
Glucose-Capillary: 131 mg/dL — ABNORMAL HIGH (ref 70–99)

## 2022-01-21 LAB — CBC
HCT: 26.9 % — ABNORMAL LOW (ref 36.0–46.0)
Hemoglobin: 8.4 g/dL — ABNORMAL LOW (ref 12.0–15.0)
MCH: 26.6 pg (ref 26.0–34.0)
MCHC: 31.2 g/dL (ref 30.0–36.0)
MCV: 85.1 fL (ref 80.0–100.0)
Platelets: 176 10*3/uL (ref 150–400)
RBC: 3.16 MIL/uL — ABNORMAL LOW (ref 3.87–5.11)
RDW: 17.2 % — ABNORMAL HIGH (ref 11.5–15.5)
WBC: 10.1 10*3/uL (ref 4.0–10.5)
nRBC: 0 % (ref 0.0–0.2)

## 2022-01-21 MED ORDER — ALBUMIN HUMAN 25 % IV SOLN
INTRAVENOUS | Status: AC
  Administered 2022-01-21: 25 g
  Filled 2022-01-21: qty 100

## 2022-01-21 MED ORDER — HEPARIN SODIUM (PORCINE) 1000 UNIT/ML IJ SOLN
INTRAMUSCULAR | Status: AC
  Filled 2022-01-21: qty 4

## 2022-01-21 NOTE — Procedures (Signed)
Patient seen and examined on Hemodialysis. BP (!) 105/47   Pulse 78   Temp 98.5 F (36.9 C) (Oral)   Resp 12   Wt (!) 143.8 kg   SpO2 100%   BMI 49.65 kg/m   QB 400 mL/ min via TDC, UF goal 2.5L  Tolerating treatment without complaints at this time.   [name] MD [facility] Pgr [phone] 10:00 AM

## 2022-01-21 NOTE — Evaluation (Signed)
Physical Therapy Evaluation Patient Details Name: [name] MRN: [id number] DOB: 26-Jul-1965 Today's Date: 01/21/2022  History of Present Illness  [name] is a 56 y.o. female with PMH significant for DM2, HTN, HLD, morbid obesity, OSA, CKD 5, CAD/CABG, chronic diastolic CHF. She was previously admitted 9/27-10/17 with R foot ulcer, generalized rash and renal failure initiated HD, but with renal recovery.  Now admitted due to AMS, weakness, elevated creatinine and potassium, reinitiated dialysis.  Clinical Impression  Patient presents with decreased mobility due to deficits listed in PT problem list.  She was able to get up to EOB with max A of 2 and to stand and transfer to recliner with mod A of 2.  Previously she was evidently functioning on her own at home as her daughter had moved out.  Feel she may need STSNF level rehab at d/c.  PT will continue to follow acutely.        Recommendations for follow up therapy are one component of a multi-disciplinary discharge planning process, led by the attending physician.  Recommendations may be updated based on patient status, additional functional criteria and insurance authorization.  Follow Up Recommendations Skilled nursing-short term rehab (<3 hours/day) Can patient physically be transported by private vehicle: No    Assistance Recommended at Discharge Frequent or constant Supervision/Assistance  Patient can return home with the following  Two people to help with walking and/or transfers;A lot of help with bathing/dressing/bathroom;Assist for transportation;Help with stairs or ramp for entrance    Equipment Recommendations None recommended by PT  Recommendations for Other Services       Functional Status Assessment Patient has had a recent decline in their functional status and demonstrates the ability to make significant improvements in function in a reasonable and predictable amount of time.     Precautions / Restrictions  Precautions Precautions: Fall Precaution Comments: monitor LE skin, watch BP, rectal tube Restrictions Weight Bearing Restrictions: No RLE Weight Bearing: Weight bearing as tolerated Other Position/Activity Restrictions: wound to R foot      Mobility  Bed Mobility Overal bed mobility: Needs Assistance Bed Mobility: Supine to Sit     Supine to sit: HOB elevated, +2 for physical assistance, Max assist     General bed mobility comments: assist for legs off bed and to lift trunk with 2 HHA, assist to scoot hips    Transfers Overall transfer level: Needs assistance Equipment used: Rolling walker (2 wheels) Transfers: Sit to/from Stand Sit to Stand: From elevated surface, Mod assist, +2 physical assistance   Step pivot transfers: Min assist, +2 physical assistance       General transfer comment: up from EOB with greatly increased time as pt tearful then c/o pain from tube; stand step to recliner with increased time and assist for safety using RW    Ambulation/Gait                  Stairs            Wheelchair Mobility    Modified Rankin (Stroke Patients Only)       Balance Overall balance assessment: Needs assistance Sitting-balance support: Feet supported Sitting balance-Leahy Scale: Good     Standing balance support: Bilateral upper extremity supported Standing balance-Leahy Scale: Poor Standing balance comment: reliant on UE support and min A                             Pertinent Vitals/Pain Pain Assessment  Faces Pain Scale: Hurts even more Pain Location: rectal tube/buttoks Pain Descriptors / Indicators: Jabbing, Tender Pain Intervention(s): Monitored during session, Repositioned    Home Living Family/patient expects to be discharged to:: Private residence Living Arrangements: Alone Available Help at Discharge: Family;Available PRN/intermittently Type of Home: Mobile home Home Access: Stairs to enter Entrance Stairs-Rails:  Can reach both Entrance Stairs-Number of Steps: 5   Home Layout: One level Home Equipment: Rollator (4 wheels);Cane - single Barista (2 wheels) Additional Comments: daughter moved out per pt    Prior Function Prior Level of Function : Independent/Modified Independent;Needs assist             Mobility Comments: was mobilizing at home with walker, but with uremia became weak and fell and son called 911 ADLs Comments: verbalizes being able to perform self care and toileting independently. Mod independent with IADLs, does not cook much and gets most food delivered     Hand Dominance   Dominant Hand: Left    Extremity/Trunk Assessment   Upper Extremity Assessment Upper Extremity Assessment: Generalized weakness    Lower Extremity Assessment Lower Extremity Assessment: Defer to PT evaluation RLE Deficits / Details: AAROM limited by weakness, soft tissue, strength hip flexion 3-/5, knee extension 4/5; bilat LE's wrapped with gauze and ace wraps RLE Coordination: decreased gross motor LLE Deficits / Details: AAROM limited by weakness, soft tissue, strength hip flexion 3-/5, knee extension 4/5; bilat LE's wrapped with gauze and ace wraps LLE Coordination: decreased gross motor    Cervical / Trunk Assessment Cervical / Trunk Assessment: Other exceptions Cervical / Trunk Exceptions: body habitus  Communication   Communication: No difficulties  Cognition Arousal/Alertness: Awake/alert Behavior During Therapy: WFL for tasks assessed/performed Overall Cognitive Status: Within Functional Limits for tasks assessed                                 General Comments: not formally tested, but appropriate for situation though she is praying for renal and strength recovery and that she doesn't have to do dialysis or go to rehab        General Comments General comments (skin integrity, edema, etc.): BP 90's/40's supine and sitting    Exercises      Assessment/Plan    PT Assessment Patient needs continued PT services  PT Problem List Decreased strength;Decreased cognition;Decreased range of motion;Decreased safety awareness;Decreased activity tolerance;Decreased mobility       PT Treatment Interventions Balance training;Gait training;DME instruction;Stair training;Functional mobility training;Patient/family education;Therapeutic activities;Therapeutic exercise    PT Goals (Current goals can be found in the Care Plan section)  Acute Rehab PT Goals Patient Stated Goal: to go home PT Goal Formulation: With patient Time For Goal Achievement: 02/04/22 Potential to Achieve Goals: Good    Frequency Min 3X/week     Co-evaluation PT/OT/SLP Co-Evaluation/Treatment: Yes Reason for Co-Treatment: Complexity of the patient's impairments (multi-system involvement) PT goals addressed during session: Mobility/safety with mobility;Balance;Proper use of DME OT goals addressed during session: ADL's and self-care       AM-PAC PT "6 Clicks" Mobility  Outcome Measure Help needed turning from your back to your side while in a flat bed without using bedrails?: Total Help needed moving from lying on your back to sitting on the side of a flat bed without using bedrails?: Total Help needed moving to and from a bed to a chair (including a wheelchair)?: A Lot Help needed standing up from a chair using  your arms (e.g., wheelchair or bedside chair)?: Total Help needed to walk in hospital room?: Total Help needed climbing 3-5 steps with a railing? : Total 6 Click Score: 7    End of Session   Activity Tolerance: Patient limited by fatigue Patient left: in chair;with call bell/phone within reach Nurse Communication: Mobility status PT Visit Diagnosis: Other abnormalities of gait and mobility (R26.89)    Time: 0981-1914 PT Time Calculation (min) (ACUTE ONLY): 44 min   Charges:   PT Evaluation $PT Eval Moderate Complexity: 1 Mod PT  Treatments $Therapeutic Activity: 8-22 mins        Magda Kiel, PT Acute Rehabilitation Services Office:[phone] 01/21/2022   Reginia Naas 01/21/2022, 4:09 PM

## 2022-01-21 NOTE — Progress Notes (Signed)
Received patient in bed to unit.  Alert and oriented.  Informed consent signed and in chart.   Treatment initiated: 0824 Treatment completed: 1238  Patient tolerated well.  Transported back to the room  Alert, without acute distress.  Hand-off given to patient's nurse.   Access used: Cath Access issues: none  Total UF removed: 1L Medication(s) given: Albumin Post HD VS: 100/43,77,100%,11 Post HD weight: 142.7kg   [facility]

## 2022-01-21 NOTE — Progress Notes (Signed)

## 2022-01-21 NOTE — Plan of Care (Signed)
Nutrition Education Note  RD consulted for Renal Education. Provided "Nutrition for People with Dialysis" and "Meal Ideas for Diabetes and Dialysis" to patient. Reviewed dietary components important for those on dialysis including protein, potassium, phosphorus, sodium, fluids, and carbohydrates as patient has history of diabetes.  Explained why diet restrictions are needed and provided lists of foods to limit/avoid that are high potassium. Provided specific recommendations on safer alternatives of these foods. Strongly encouraged compliance of this diet.   Discussed importance of protein intake at each meal and snack. Provided examples of how to maximize protein intake throughout the day. Discussed need for fluid restriction with dialysis, importance of minimizing weight gain between HD treatments.  Encouraged pt to discuss specific diet questions/concerns with RD at HD outpatient facility. Teach back method used.  Expect fair compliance.  Body mass index is 49.27 kg/m. Pt meets criteria for Morbid Obesity based on current BMI.  Current diet order is Heart, patient is consuming approximately 100% of meals at this time. Labs and medications reviewed. No further nutrition interventions warranted at this time. RD contact information provided. If additional nutrition issues arise, please re-consult RD.  Samson Frederic RD, LDN For contact information, refer to [facility].
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
## 2022-01-21 NOTE — Progress Notes (Signed)
[facility] Progress Note   Assessment/ Plan:   # New ESRD - AKI on CKD stage IV/V now with progression to ESRD.  Previously palliative care involved and she did ulimately start HD last admission - per charting she just had hoped she would never need it and then they hoped had she enough renal function to come off.  First HD on 10/28 after tunn catheter with IR   - HD on 10/30 HD #2, HD #3 11/1 -  CLIP needed- in process - needs perm access- ordered vein mapping - c/s VVS- discussedw ith pt yesterday and today    # Hyperkalemia - resolved   # Confusion/encephalopathy - Concerning for uremia in the setting of CKD progression - now on HD   # Hypotension - Hx of HTN - avoid hypotension - would hold imdur and coreg - Continue midodrine 5 mg TID (new med this admit)   # Chronic diastolic CHF  - holding imdur and coreg   - optimize volume status with HD    # Anemia CKD  - iron deficiency - repleted earlier this mont per charting - Will start aranesp 40 mcg weekly on Sundays for now      Disposition - CLIP in process  Subjective:    Seen on HD.  Upset today because she couldn't get any assistance feeding herself in her room.     Objective:   BP (!) 105/47   Pulse 78   Temp 98.5 F (36.9 C) (Oral)   Resp 12   Wt (!) 143.8 kg   SpO2 100%   BMI 49.65 kg/m   Physical Exam: GYF:VCBSW in bed, more awake and alert CVS: RRR Resp: clear Abd: soft Ext: 1+ LE edema ACCESS: R IJ [facility]  Labs: BMET Recent Labs  Lab 01/16/22 2304 01/17/22 0347 01/17/22 0747 01/18/22 0917 01/19/22 0308 01/21/22 0414  NA 138 134*  --  137 137 137  K 6.4* 5.5* 5.6* 4.8 4.6 3.4*  CL 103 98  --  99 99 100  CO2 23 22  --  '23 23 26  '$ GLUCOSE 155* 187*  --  98 115* 129*  BUN 116* 115*  --  111* 115* 75*  CREATININE 8.43* 8.15*  --  7.88* 8.14* 5.64*  CALCIUM 8.8* 8.3*  --  8.0* 8.2* 8.7*  PHOS 7.8*  --   --  6.6* 6.6* 4.5   CBC Recent Labs  Lab 01/16/22 2304  01/17/22 0347 01/21/22 0414  WBC 8.1 8.0 10.1  NEUTROABS 5.0  --   --   HGB 8.6* 7.9* 8.4*  HCT 27.1* 24.7* 26.9*  MCV 86.0 84.6 85.1  PLT 204 195 176      Medications:     Chlorhexidine Gluconate Cloth  6 each Topical Q0600   Chlorhexidine Gluconate Cloth  6 each Topical Q0600   cyclobenzaprine  5 mg Oral TID   darbepoetin (ARANESP) injection - NON-DIALYSIS  40 mcg Subcutaneous Q Sun-1800   heparin  5,000 Units Subcutaneous Q8H   hydrocerin   Topical Daily   insulin aspart  0-9 Units Subcutaneous Q4H   midodrine  5 mg Oral TID WC     [name] MD 01/21/2022, 9:58 AM

## 2022-01-21 NOTE — Progress Notes (Signed)
OT Cancellation Note  Patient Details Name: Lauralei Clouse MRN: [id number] DOB: 15-Jan-1966   Cancelled Treatment:    Reason Eval/Treat Not Completed: Patient at procedure or test/ unavailable. At HD.  Amaree Leeper D Estanislado Surgeon 01/21/2022, 11:26 AM 01/21/2022  RP, OTR/L  Acute Rehabilitation Services  Office:  [phone]

## 2022-01-21 NOTE — Progress Notes (Signed)
Pt's case is being reviewed by medical director at [facility]. Awaiting final determination.   Melven Sartorius Renal Navigator [phone]

## 2022-01-21 NOTE — Progress Notes (Signed)
PROGRESS NOTE  [name]  DOB: 1965-07-01  PCP: Jacklynn Ganong, MD YQI:[id number]  DOA: 01/16/2022  LOS: 5 days  Hospital Day: 6  Brief narrative: [name] is a 56 y.o. female with PMH significant for DM2, HTN, HLD, morbid obesity, OSA, CKD 5, CAD/CABG, chronic diastolic CHF.  Patient was recently hospitalized 9/27 to 10/17 for right foot diabetic ulcer for which podiatry and vascular surgery were consulted. Treated with antibiotics.  Hospitalization was complicated by development of generalized rash likely due to hypotension antibiotics.  Renal function worsened as well and hence patient needed dialysis.  Her renal function improved and she was discharged off dialysis with a creatinine of 2.5 on 10/17. 10/27, patient presented to OSH with altered mental status, lethargy, weakness.  Creatinine was elevated to 7.7, potassium elevated to 6.  Chest x-ray showed pulmonary edema.  Patient was given IV calcium gluconate, IV Lasix, insulin, dextrose and transferred to [facility]. Admitted to hospitalist service. Nephrology was consulted.  Patient was reinitiated on dialysis.  Subjective: Patient was seen and examined this afternoon.  Lying on bed.  Not in distress.  Underwent dialysis this morning. Chart reviewed In the last 24 hours, no fever, heart rate in 80s, blood pressure low between 80s and 100s, breathing on room air Last set of labs from this morning with hemoglobin low at 8.4, potassium low at 3.4, BUN/creatinine 75/5.64.  Assessment and plan: AKI on CKD 5  New ESRD Presented with significantly elevated creatinine to 7.7 and potassium to 6.  Nephrology consulted. 10/28, tunnel catheter placed by IR and underwent first HD, third HD today 11/1. Needs vein mapping for permanent access.  Vascular surgery to see.  Acute on chronic heart failure with preserved ejection fraction (HFpEF) Hypotension   History of hypertension. Volume overloaded at the time of admission. Expect  improvement with further dialysis. Blood pressure running low between 80s and 100s. Currently on midodrine 3 times daily support to continue HD. PTA on Coreg 25 mg twice daily, torsemide 80 mg twice daily, Imdur 120 mg daily.  These are currently on hold.   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 Mentation gradually improving.  Type 2 diabetes mellitus A1c 6.9 on 10/25/2021 PTA on Basaglar.  Currently on hold. Currently blood sugars controlled on sliding scale insulin Accu-Cheks. Gabapentin for neuropathy, currently on hold. Recent Labs  Lab 01/20/22 2346 01/21/22 0405 01/21/22 0742 01/21/22 1211 01/21/22 1255  GLUCAP 140* 136* 129* 89 97   Anemia of chronic disease Hemoglobin stable around 7. Monitor. Management per nephrology. No active bleeding. Recent Labs    10/27/21 0600 10/28/21 0754 12/23/21 1326 12/23/21 1802 12/25/21 0044 01/05/22 0059 01/06/22 0046 01/16/22 2304 01/17/22 0347 01/21/22 0414  HGB 7.3*   < > 7.9*  --    < > 7.6* 7.2* 8.6* 7.9* 8.4*  MCV 85.7   < > 84.7  --    < > 86.0 85.5 86.0 84.6 85.1  VITAMINB12 534  --   --   --   --   --   --   --   --   --   FOLATE 16.3  --   --   --   --   --   --   --   --   --   FERRITIN 39  --   --  84  --   --   --   --   --   --   TIBC 413  --   --  308  --   --   --   --   --   --   IRON 37  --   --  23*  --   --   --   --   --   --   RETICCTPCT  --   --  4.5*  --   --   --   --   --   --   --    < > = values in this interval not displayed.   Morbid obesity  -Body mass index is 49.27 kg/m. Patient has been advised to make an attempt to improve diet and exercise patterns to aid in weight loss.  OSA (obstructive sleep apnea) Continue CPAP nightly.   Right foot diabetic ulcer 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 Continue local wound care. Per wound care instructions, daily cleanse, pat dry and application of moisturizer (Eucerin  cream) RLE wound will be covered with a folded layer of xeroform gauze topped with dry gauze. Both LEs are to be wrapped with Kerlix roll gauze and secured with paper tape. Feet are to be placed into Prevalon boots. A sacral silicone foam is to be placed for PI prevention.   Rash with antibiotics skin flaking off.   ? Foley catheter Inserted at outside hospital prior to admission on 10/27. We will continue for now for close I and O Voiding trial??   Generalized body ache Most likely in the setting of uremia. Continue Flexeril 5 mg 3 times daily.  Goals of care   Code Status: Full Code    Mobility: Encourage ambulation  Skin assessment:  Pressure Injury 12/28/21 Buttocks Right Stage 2 -  Partial thickness loss of dermis presenting as a shallow open injury with a red, pink wound bed without slough. (Active)  12/28/21 2300  Location: Buttocks  Location Orientation: Right  Staging: Stage 2 -  Partial thickness loss of dermis presenting as a shallow open injury with a red, pink wound bed without slough.  Wound Description (Comments):   Present on Admission: -- (I'm not sure)     Pressure Injury 12/28/21 Left Stage 2 -  Partial thickness loss of dermis presenting as a shallow open injury with a red, pink wound bed without slough. (Active)  12/28/21 2300  Location:   Location Orientation: Left  Staging: Stage 2 -  Partial thickness loss of dermis presenting as a shallow open injury with a red, pink wound bed without slough.  Wound Description (Comments):   Present on Admission: -- (I'm not sure)    Nutritional status:  Body mass index is 49.27 kg/m.          Diet:  Diet Order             Diet Heart Room service appropriate? Yes; Fluid consistency: Thin  Diet effective now                   DVT prophylaxis:  heparin injection 5,000 Units Start: 01/17/22 0600   Antimicrobials: None Fluid: None Consultants: Nephrology Family Communication: None at  bedside  Status is: Inpatient  Continue in-hospital care because: Outpatient dialysis chair arrangement Level of care: Progressive   Dispo: The patient is from: Home              Anticipated d/c is to: Pending clinical course              Patient currently is not medically stable to d/c.  Difficult to place patient No     Infusions:    Scheduled Meds:  Chlorhexidine Gluconate Cloth  6 each Topical Q0600   Chlorhexidine Gluconate Cloth  6 each Topical Q0600   cyclobenzaprine  5 mg Oral TID   darbepoetin (ARANESP) injection - NON-DIALYSIS  40 mcg Subcutaneous Q Sun-1800   heparin  5,000 Units Subcutaneous Q8H   heparin sodium (porcine)       hydrocerin   Topical Daily   insulin aspart  0-9 Units Subcutaneous Q4H   midodrine  5 mg Oral TID WC    PRN meds: acetaminophen **OR** acetaminophen, heparin sodium (porcine), ondansetron **OR** ondansetron (ZOFRAN) IV   Antimicrobials: Anti-infectives (From admission, onward)    Start     Dose/Rate Route Frequency Ordered Stop   01/17/22 1301  ceFAZolin (ANCEF) 2-4 GM/100ML-% IVPB       Note to Pharmacy: Roe Coombs H: cabinet override      01/17/22 1301 01/18/22 0114   01/17/22 1130  ceFAZolin (ANCEF) IVPB 3g/100 mL premix        3 g 200 mL/hr over 30 Minutes Intravenous To Radiology 01/17/22 1044 01/18/22 1130       Objective: Vitals:   01/21/22 1238 01/21/22 1255  BP: (!) 100/43 (!) 91/45  Pulse: 77 79  Resp: 11 15  Temp:    SpO2: 100% 98%    Intake/Output Summary (Last 24 hours) at 01/21/2022 1457 Last data filed at 01/21/2022 1238 Gross per 24 hour  Intake 240 ml  Output 1000 ml  Net -760 ml   Filed Weights   01/21/22 0820 01/21/22 1238  Weight: (!) 143.8 kg (!) 142.7 kg   Weight change:  Body mass index is 49.27 kg/m.   Physical Exam: General exam: Middle-aged African-American female.  Not in physical distress Skin: No rashes, lesions or ulcers. HEENT: Atraumatic, normocephalic, no obvious  bleeding Lungs: Clear to auscultation bilaterally CVS: Regular rate and rhythm, no murmur GI/Abd soft, nontender, nondistended, bowel sound present CNS: Alert, awake, oriented x3 Psychiatry: Mood appropriate Extremities: No edema, no calf tenderness  Data Review: I have personally reviewed the laboratory data and studies available.  F/u labs ordered Unresulted Labs (From admission, onward)     Start     Ordered   01/20/22 0500  CBC  Daily at 5am,   R     Question:  Specimen collection method  Answer:  Lab=Lab collect   01/19/22 2153   01/19/22 0500  Renal function panel  Daily at 5am,   R     Question:  Specimen collection method  Answer:  Lab=Lab collect   01/18/22 0900            Signed, Terrilee Croak, MD [facility] 01/21/2022

## 2022-01-21 NOTE — Evaluation (Signed)
Occupational Therapy Evaluation Patient Details Name: [name] MRN: [id number] DOB: Jul 11, 1965 Today's Date: 01/21/2022   History of Present Illness [name] is a 56 y.o. female with PMH significant for DM2, HTN, HLD, morbid obesity, OSA, CKD 5, CAD/CABG, chronic diastolic CHF. She was previously admitted 9/27-10/17 with R foot ulcer, generalized rash and renal failure initiated HD, but with renal recovery.  Now admitted due to AMS, weakness, elevated creatinine and potassium, reinitiated dialysis.   Clinical Impression   Patient admitted for the diagnosis above.  Patient stating she now lives alone, and was still completing her own ADL, but doesn't do much iADL.  Walks with a RW.  Currently she is bedlevel for ADL and +2 for basic mobility.  OT will continue efforts in the acute setting, with SNF recommended for post acute rehab prior to returning home.  Currently, she does not have the physical assist to return directly home.         Recommendations for follow up therapy are one component of a multi-disciplinary discharge planning process, led by the attending physician.  Recommendations may be updated based on patient status, additional functional criteria and insurance authorization.   Follow Up Recommendations  Skilled nursing-short term rehab (<3 hours/day)    Assistance Recommended at Discharge Frequent or constant Supervision/Assistance  Patient can return home with the following A lot of help with bathing/dressing/bathroom;A lot of help with walking and/or transfers;Help with stairs or ramp for entrance;Assist for transportation;Assistance with cooking/housework    Functional Status Assessment  Patient has had a recent decline in their functional status and demonstrates the ability to make significant improvements in function in a reasonable and predictable amount of time.  Equipment Recommendations  Wheelchair (measurements OT);Wheelchair cushion (measurements OT)     Recommendations for Other Services       Precautions / Restrictions Precautions Precautions: Fall Precaution Comments: monitor LE skin, watch BP, rectal tube Restrictions Weight Bearing Restrictions: No RLE Weight Bearing: Weight bearing as tolerated Other Position/Activity Restrictions: wound to R foot      Mobility Bed Mobility Overal bed mobility: Needs Assistance Bed Mobility: Supine to Sit     Supine to sit: HOB elevated, +2 for physical assistance, Max assist          Transfers Overall transfer level: Needs assistance Equipment used: Rolling walker (2 wheels) Transfers: Sit to/from Stand Sit to Stand: From elevated surface, Mod assist, +2 physical assistance                  Balance Overall balance assessment: Needs assistance Sitting-balance support: Feet supported Sitting balance-Leahy Scale: Good     Standing balance support: Bilateral upper extremity supported Standing balance-Leahy Scale: Poor Standing balance comment: reliant on UE support and min A                           ADL either performed or assessed with clinical judgement   ADL Overall ADL's : Needs assistance/impaired Eating/Feeding: Set up;Sitting   Grooming: Wash/dry hands;Wash/dry face;Set up;Sitting   Upper Body Bathing: Moderate assistance;Sitting   Lower Body Bathing: Maximal assistance;Sit to/from stand   Upper Body Dressing : Moderate assistance;Sitting   Lower Body Dressing: Maximal assistance;Sit to/from stand   Toilet Transfer: Moderate assistance;+2 for physical assistance   Toileting- Clothing Manipulation and Hygiene: Total assistance;Sit to/from stand               Vision  Perception     Praxis      Pertinent Vitals/Pain Pain Assessment Faces Pain Scale: Hurts even more Pain Location: rectal tube/buttoks Pain Descriptors / Indicators: Sore Pain Intervention(s): Monitored during session     Hand Dominance Left    Extremity/Trunk Assessment Upper Extremity Assessment Upper Extremity Assessment: Generalized weakness   Lower Extremity Assessment Lower Extremity Assessment: Defer to PT evaluation RLE Deficits / Details: AAROM limited by weakness, soft tissue, strength hip flexion 3-/5, knee extension 4/5; bilat LE's wrapped with gauze and ace wraps RLE Coordination: decreased gross motor LLE Deficits / Details: AAROM limited by weakness, soft tissue, strength hip flexion 3-/5, knee extension 4/5; bilat LE's wrapped with gauze and ace wraps LLE Coordination: decreased gross motor   Cervical / Trunk Assessment Cervical / Trunk Assessment: Other exceptions Cervical / Trunk Exceptions: body habitus   Communication Communication Communication: No difficulties   Cognition Arousal/Alertness: Awake/alert Behavior During Therapy: WFL for tasks assessed/performed Overall Cognitive Status: Within Functional Limits for tasks assessed                                       General Comments  BP 90's/40's supine and sitting    Exercises     Shoulder Instructions      Home Living Family/patient expects to be discharged to:: Private residence Living Arrangements: Alone Available Help at Discharge: Family;Available PRN/intermittently Type of Home: Mobile home Home Access: Stairs to enter Entrance Stairs-Number of Steps: 5 Entrance Stairs-Rails: Can reach both Home Layout: One level     Bathroom Shower/Tub: Teacher, early years/pre: Standard Bathroom Accessibility: Yes   Home Equipment: Rollator (4 wheels);Cane - single Barista (2 wheels)   Additional Comments: daughter moved out per pt      Prior Functioning/Environment Prior Level of Function : Independent/Modified Independent;Needs assist             Mobility Comments: was mobilizing at home with walker, but with uremia became weak and fell and son called 911 ADLs Comments: verbalizes being able  to perform self care and toileting independently. Mod independent with IADLs, does not cook much and gets most food delivered        OT Problem List: Decreased strength;Decreased range of motion;Decreased activity tolerance;Impaired balance (sitting and/or standing);Pain      OT Treatment/Interventions: Self-care/ADL training;Therapeutic exercise;Balance training;Therapeutic activities;DME and/or AE instruction    OT Goals(Current goals can be found in the care plan section) Acute Rehab OT Goals Patient Stated Goal: Return home OT Goal Formulation: With patient Time For Goal Achievement: 02/04/22 Potential to Achieve Goals: Good  OT Frequency: Min 2X/week    Co-evaluation PT/OT/SLP Co-Evaluation/Treatment: Yes Reason for Co-Treatment: Complexity of the patient's impairments (multi-system involvement) PT goals addressed during session: Mobility/safety with mobility;Balance;Proper use of DME OT goals addressed during session: ADL's and self-care      AM-PAC OT "6 Clicks" Daily Activity     Outcome Measure Help from another person eating meals?: A Little Help from another person taking care of personal grooming?: A Little Help from another person toileting, which includes using toliet, bedpan, or urinal?: A Lot Help from another person bathing (including washing, rinsing, drying)?: A Lot Help from another person to put on and taking off regular upper body clothing?: A Lot Help from another person to put on and taking off regular lower body clothing?: A Lot 6 Click Score: 14  End of Session Equipment Utilized During Treatment: Rolling walker (2 wheels) Nurse Communication: Mobility status  Activity Tolerance: Patient limited by pain Patient left: in chair;with call bell/phone within reach  OT Visit Diagnosis: Unsteadiness on feet (R26.81)                Time: 2536-6440 OT Time Calculation (min): 37 min Charges:  OT General Charges $OT Visit: 1 Visit OT Evaluation $OT Eval  Moderate Complexity: 1 Mod  01/21/2022  RP, OTR/L  Acute Rehabilitation Services  Office:  [phone]   Metta Clines 01/21/2022, 4:07 PM

## 2022-01-21 NOTE — Progress Notes (Signed)
PT Cancellation Note  Patient Details Name: [name] MRN: [id number] DOB: 15-Apr-1965   Cancelled Treatment:    Reason Eval/Treat Not Completed: Patient at procedure or test/unavailable  Patient in hemodialysis. Will attempt later today   [place]  Office [phone]  Rexanne Mano 01/21/2022, 10:06 AM

## 2022-01-22 ENCOUNTER — Inpatient Hospital Stay (HOSPITAL_COMMUNITY): Payer: Medicare Other

## 2022-01-22 ENCOUNTER — Encounter (HOSPITAL_COMMUNITY): Payer: Medicare Other

## 2022-01-22 DIAGNOSIS — E875 Hyperkalemia: Secondary | ICD-10-CM | POA: Diagnosis not present

## 2022-01-22 LAB — CBC
HCT: 30.6 % — ABNORMAL LOW (ref 36.0–46.0)
Hemoglobin: 9.4 g/dL — ABNORMAL LOW (ref 12.0–15.0)
MCH: 26.6 pg (ref 26.0–34.0)
MCHC: 30.7 g/dL (ref 30.0–36.0)
MCV: 86.4 fL (ref 80.0–100.0)
Platelets: 167 10*3/uL (ref 150–400)
RBC: 3.54 MIL/uL — ABNORMAL LOW (ref 3.87–5.11)
RDW: 17 % — ABNORMAL HIGH (ref 11.5–15.5)
WBC: 11.2 10*3/uL — ABNORMAL HIGH (ref 4.0–10.5)
nRBC: 0 % (ref 0.0–0.2)

## 2022-01-22 LAB — RENAL FUNCTION PANEL
Albumin: 2.2 g/dL — ABNORMAL LOW (ref 3.5–5.0)
Anion gap: 11 (ref 5–15)
BUN: 43 mg/dL — ABNORMAL HIGH (ref 6–20)
CO2: 25 mmol/L (ref 22–32)
Calcium: 8.5 mg/dL — ABNORMAL LOW (ref 8.9–10.3)
Chloride: 99 mmol/L (ref 98–111)
Creatinine, Ser: 3.51 mg/dL — ABNORMAL HIGH (ref 0.44–1.00)
GFR, Estimated: 15 mL/min — ABNORMAL LOW (ref >60)
Glucose, Bld: 111 mg/dL — ABNORMAL HIGH (ref 70–99)
Phosphorus: 3.2 mg/dL (ref 2.5–4.6)
Potassium: 3.5 mmol/L (ref 3.5–5.1)
Sodium: 135 mmol/L (ref 135–145)

## 2022-01-22 LAB — GLUCOSE, CAPILLARY
Glucose-Capillary: 118 mg/dL — ABNORMAL HIGH (ref 70–99)
Glucose-Capillary: 116 mg/dL — ABNORMAL HIGH (ref 70–99)
Glucose-Capillary: 145 mg/dL — ABNORMAL HIGH (ref 70–99)
Glucose-Capillary: 140 mg/dL — ABNORMAL HIGH (ref 70–99)
Glucose-Capillary: 132 mg/dL — ABNORMAL HIGH (ref 70–99)
Glucose-Capillary: 152 mg/dL — ABNORMAL HIGH (ref 70–99)

## 2022-01-22 MED ORDER — HYDROMORPHONE HCL 1 MG/ML IJ SOLN
0.5000 mg | INTRAMUSCULAR | Status: AC
  Administered 2022-01-22: 0.5 mg via INTRAVENOUS
  Filled 2022-01-22: qty 0.5

## 2022-01-22 NOTE — Plan of Care (Signed)
  Problem: Education: Goal: Knowledge of General Education information will improve Description Including pain rating scale, medication(s)/side effects and non-pharmacologic comfort measures Outcome: Progressing   

## 2022-01-22 NOTE — Progress Notes (Addendum)
Physical Therapy Treatment Patient Details Name: [name] MRN: [id number] DOB: 1965/09/24 Today's Date: 01/22/2022   History of Present Illness [name] is a 56 y.o. female with PMH significant for DM2, HTN, HLD, morbid obesity, OSA, CKD 5, CAD/CABG, chronic diastolic CHF. She was previously admitted 9/27-10/17 with R foot ulcer, generalized rash and renal failure initiated HD, but with renal recovery.  Now admitted due to AMS, weakness, elevated creatinine and potassium, reinitiated dialysis.    PT Comments    Pt requesting to go back to bed after 1.5 hours up in chair. Assisted pt to stand and step back to bed. Pt expresses that she is fatigued from activity. Asking how she is going to do things at home with HD. Explained that HD is three days a week and we are recommending going to rehab to get stronger prior to returning home. Pt seems to not have fully processed the permanence of HD and how it may affect her life every day. Pt asking if people always have to go to HD 3x/wk or if they do less. I explained 3x/wk is the standard.    Recommendations for follow up therapy are one component of a multi-disciplinary discharge planning process, led by the attending physician.  Recommendations may be updated based on patient status, additional functional criteria and insurance authorization.  Follow Up Recommendations  Skilled nursing-short term rehab (<3 hours/day) Can patient physically be transported by private vehicle: Yes   Assistance Recommended at Discharge Frequent or constant Supervision/Assistance  Patient can return home with the following Two people to help with walking and/or transfers;Two people to help with bathing/dressing/bathroom;Assistance with cooking/housework;Help with stairs or ramp for entrance;Assist for transportation   Equipment Recommendations  None recommended by PT    Recommendations for Other Services       Precautions / Restrictions  Precautions Precautions: Fall Restrictions Weight Bearing Restrictions: No     Mobility  Bed Mobility Overal bed mobility: Needs Assistance Bed Mobility: Sit to Supine     Sit to supine: Mod assist   General bed mobility comments: Assist to bring legs back up into bed.    Transfers Overall transfer level: Needs assistance Equipment used: Rolling walker (2 wheels) Transfers: Sit to/from Stand, Bed to chair/wheelchair/BSC Sit to Stand: Mod assist   Step pivot transfers: Min assist       General transfer comment: Assist to bring hips up. Verbal cues for hand placement. Recliner to bed with walker and assist for balance.    Ambulation/Gait            Stairs             Wheelchair Mobility    Modified Rankin (Stroke Patients Only)       Balance Overall balance assessment: Needs assistance Sitting-balance support: No upper extremity supported, Feet supported Sitting balance-Leahy Scale: Good     Standing balance support: Bilateral upper extremity supported, During functional activity, Reliant on assistive device for balance Standing balance-Leahy Scale: Poor Standing balance comment: walker and min assist for static standing                            Cognition Arousal/Alertness: Awake/alert Behavior During Therapy: WFL for tasks assessed/performed Overall Cognitive Status: Within Functional Limits for tasks assessed  General Comments: Pt trying to process what being on HD means for her day to day functioning at home. Doesn't appear to fully understand permanent impact to lifestyle        Exercises      General Comments General comments (skin integrity, edema, etc.): VSS on RA      Pertinent Vitals/Pain Pain Assessment Pain Assessment: Faces Faces Pain Scale: Hurts little more Pain Location: BLE to handling Pain Descriptors / Indicators: Grimacing Pain Intervention(s): Limited  activity within patient's tolerance, Monitored during session, Repositioned    Home Living                          Prior Function            PT Goals (current goals can now be found in the care plan section) Acute Rehab PT Goals Patient Stated Goal: to go home Progress towards PT goals: Progressing toward goals    Frequency    Min 3X/week      PT Plan Current plan remains appropriate    Co-evaluation              AM-PAC PT "6 Clicks" Mobility   Outcome Measure  Help needed turning from your back to your side while in a flat bed without using bedrails?: A Lot Help needed moving from lying on your back to sitting on the side of a flat bed without using bedrails?: Total Help needed moving to and from a bed to a chair (including a wheelchair)?: A Lot Help needed standing up from a chair using your arms (e.g., wheelchair or bedside chair)?: A Lot Help needed to walk in hospital room?: Total Help needed climbing 3-5 steps with a railing? : Total 6 Click Score: 9    End of Session Equipment Utilized During Treatment: Gait belt Activity Tolerance: Patient limited by fatigue Patient left: with call bell/phone within reach;in bed;with bed alarm set Nurse Communication: Mobility status PT Visit Diagnosis: Other abnormalities of gait and mobility (R26.89)     Time: 1207-1223 PT Time Calculation (min) (ACUTE ONLY): 16 min  Charges:  $Gait Training: 8-22 mins $Therapeutic Activity: 8-22 mins                     [place] Office [place] 01/22/2022, 2:48 PM

## 2022-01-22 NOTE — Progress Notes (Signed)
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  Previously palliative care involved and she did ulimately start HD last admission - per charting she just had hoped she would never need it and then they hoped had she enough renal function to come off.  First HD on 10/28 after tunn catheter with IR   - HD on 10/30 HD #2, HD #3 11/1 - next HD Friday -  CLIP needed- in process - needs perm access- ordered vein mapping and VVS consult, she did not engage in access discussion yesterday.  Can follow up as OP.     # Hyperkalemia - resolved   # Confusion/encephalopathy - Concerning for uremia in the setting of CKD progression - now on HD   # Hypotension - Hx of HTN - avoid hypotension - would hold imdur and coreg - Continue midodrine 5 mg TID (new med this admit)   # Chronic diastolic CHF  - holding imdur and coreg   - optimize volume status with HD    # Anemia CKD  - iron deficiency - repleted earlier this mont per charting - Will start aranesp 40 mcg weekly on Sundays for now      Disposition - CLIP in process  Subjective:   Seen in room.  No complaints.     Objective:   BP 127/72 (BP Location: Right Arm)   Pulse 92   Temp 97.8 F (36.6 C) (Oral)   Resp 14   Wt (!) 142.7 kg   SpO2 98%   BMI 49.27 kg/m   Physical Exam: QIO:NGEXBMW in chair CVS: RRR Resp: clear Abd: soft Ext: 1+ LE edema ACCESS: R IJ [facility]  Labs: BMET Recent Labs  Lab 01/16/22 2304 01/17/22 0347 01/17/22 0747 01/18/22 0917 01/19/22 0308 01/21/22 0414 01/22/22 0326  NA 138 134*  --  137 137 137 135  K 6.4* 5.5* 5.6* 4.8 4.6 3.4* 3.5  CL 103 98  --  99 99 100 99  CO2 23 22  --  '23 23 26 25  '$ GLUCOSE 155* 187*  --  98 115* 129* 111*  BUN 116* 115*  --  111* 115* 75* 43*  CREATININE 8.43* 8.15*  --  7.88* 8.14* 5.64* 3.51*  CALCIUM 8.8* 8.3*  --  8.0* 8.2* 8.7* 8.5*  PHOS 7.8*  --   --  6.6* 6.6* 4.5 3.2    CBC Recent Labs  Lab 01/16/22 2304 01/17/22 0347 01/21/22 0414 01/22/22 0326  WBC 8.1 8.0 10.1 11.2*  NEUTROABS 5.0  --   --   --   HGB 8.6* 7.9* 8.4* 9.4*  HCT 27.1* 24.7* 26.9* 30.6*  MCV 86.0 84.6 85.1 86.4  PLT 204 195 176 167      Medications:     Chlorhexidine Gluconate Cloth  6 each Topical Q0600   Chlorhexidine Gluconate Cloth  6 each Topical Q0600   cyclobenzaprine  5 mg Oral TID   darbepoetin (ARANESP) injection - NON-DIALYSIS  40 mcg Subcutaneous Q Sun-1800   heparin  5,000 Units Subcutaneous Q8H   hydrocerin   Topical Daily   insulin aspart  0-9 Units Subcutaneous Q4H   midodrine  5 mg Oral TID WC     [name] MD 01/22/2022, 12:57 PM

## 2022-01-22 NOTE — Progress Notes (Signed)
[facility] requested additional info this am and it was faxed to clinic this morning for review. Inquired of clinic this afternoon if pt has been medically cleared for admission. Awaiting response. Aware of therapy recs for snf. Will assist as needed.   Melven Sartorius Renal Navigator [phone]

## 2022-01-22 NOTE — Progress Notes (Addendum)
Physical Therapy Treatment Patient Details Name: [name] MRN: [id number] DOB: 1965/05/07 Today's Date: 01/22/2022   History of Present Illness [name] is a 56 y.o. female with PMH significant for DM2, HTN, HLD, morbid obesity, OSA, CKD 5, CAD/CABG, chronic diastolic CHF. She was previously admitted 9/27-10/17 with R foot ulcer, generalized rash and renal failure initiated HD, but with renal recovery.  Now admitted due to AMS, weakness, elevated creatinine and potassium, reinitiated dialysis.    PT Comments    Pt making slow, steady progress and able to amb short distance in room. Pt fatigues quickly from effort. Continue to recommend SNF. Do not feel pt can manage on her own at home as well as manage HD visits.    Recommendations for follow up therapy are one component of a multi-disciplinary discharge planning process, led by the attending physician.  Recommendations may be updated based on patient status, additional functional criteria and insurance authorization.  Follow Up Recommendations  Skilled nursing-short term rehab (<3 hours/day) Can patient physically be transported by private vehicle: Yes   Assistance Recommended at Discharge Frequent or constant Supervision/Assistance  Patient can return home with the following Two people to help with walking and/or transfers;Two people to help with bathing/dressing/bathroom;Assistance with cooking/housework;Help with stairs or ramp for entrance;Assist for transportation   Equipment Recommendations  None recommended by PT    Recommendations for Other Services       Precautions / Restrictions Precautions Precautions: Fall Restrictions Weight Bearing Restrictions: No     Mobility  Bed Mobility Overal bed mobility: Needs Assistance Bed Mobility: Supine to Sit     Supine to sit: +2 for physical assistance, Mod assist, HOB elevated Sit to supine: Mod assist   General bed mobility comments: Assist to bring legs off of  bed, elevate trunk into sitting, and bring hips to EOB.    Transfers Overall transfer level: Needs assistance Equipment used: Rolling walker (2 wheels) Transfers: Sit to/from Stand, Bed to chair/wheelchair/BSC Sit to Stand: +2 physical assistance, Min assist, From elevated surface   Step pivot transfers: Min assist, +2 safety/equipment       General transfer comment: Assist to bring hips up. Verbal cues for hand placement. Bed to [facility] with walker. Stood from [facility] with mod assist and +2 for safety.    Ambulation/Gait Ambulation/Gait assistance: Min assist, +2 safety/equipment Gait Distance (Feet): 10 Feet Assistive device: Rolling walker (2 wheels) Gait Pattern/deviations: Step-to pattern, Decreased step length - right, Decreased step length - left, Decreased stride length Gait velocity: decr Gait velocity interpretation: <1.31 ft/sec, indicative of household ambulator   General Gait Details: Assist for balance and safety. (2nd person to follow with recliner to incr amb distance)   Stairs             Wheelchair Mobility    Modified Rankin (Stroke Patients Only)       Balance Overall balance assessment: Needs assistance Sitting-balance support: No upper extremity supported, Feet supported Sitting balance-Leahy Scale: Good     Standing balance support: Bilateral upper extremity supported, During functional activity, Reliant on assistive device for balance Standing balance-Leahy Scale: Poor Standing balance comment: walker and min assist for static standing                            Cognition Arousal/Alertness: Awake/alert Behavior During Therapy: WFL for tasks assessed/performed Overall Cognitive Status: Within Functional Limits for tasks assessed  Exercises      General Comments General comments (skin integrity, edema, etc.): VSS on RA      Pertinent Vitals/Pain Pain Assessment Pain  Assessment: Faces Faces Pain Scale: Hurts little more Pain Location: BLE to handling Pain Descriptors / Indicators: Grimacing Pain Intervention(s): Monitored during session, Repositioned    Home Living                          Prior Function            PT Goals (current goals can now be found in the care plan section) Acute Rehab PT Goals Patient Stated Goal: to go home Progress towards PT goals: Progressing toward goals    Frequency    Min 3X/week      PT Plan Current plan remains appropriate    Co-evaluation              AM-PAC PT "6 Clicks" Mobility   Outcome Measure  Help needed turning from your back to your side while in a flat bed without using bedrails?: A Lot Help needed moving from lying on your back to sitting on the side of a flat bed without using bedrails?: Total Help needed moving to and from a bed to a chair (including a wheelchair)?: Total Help needed standing up from a chair using your arms (e.g., wheelchair or bedside chair)?: Total Help needed to walk in hospital room?: Total Help needed climbing 3-5 steps with a railing? : Total 6 Click Score: 7    End of Session Equipment Utilized During Treatment: Gait belt Activity Tolerance: Patient limited by fatigue Patient left: in chair;with call bell/phone within reach;with chair alarm set Nurse Communication: Mobility status PT Visit Diagnosis: Other abnormalities of gait and mobility (R26.89)     Time: 1006-1047 (Pt on BSC x 10 minutes) PT Time Calculation (min) (ACUTE ONLY): 41 min  Charges:  $Gait Training: 8-22 mins $Therapeutic Activity: 8-22 mins                     [place] [facility] [place] 01/22/2022, 2:41 PM

## 2022-01-22 NOTE — Progress Notes (Signed)
PROGRESS NOTE  [name]  DOB: 1966-03-15  PCP: [name] Ganong, MD TIW:[id number]  DOA: 01/16/2022  LOS: 6 days  Hospital Day: 7  Brief narrative: [name] is a 56 y.o. female with PMH significant for DM2, HTN, HLD, morbid obesity, OSA, CKD 5, CAD/CABG, chronic diastolic CHF.  Patient was recently hospitalized 9/27 to 10/17 for right foot diabetic ulcer for which podiatry and vascular surgery were consulted. Treated with antibiotics.  Hospitalization was complicated by development of generalized rash likely due to hypotension antibiotics.  Renal function worsened as well and hence patient needed dialysis.  Her renal function improved and she was discharged off dialysis with a creatinine of 2.5 on 10/17. 10/27, patient presented to OSH with altered mental status, lethargy, weakness.  Creatinine was elevated to 7.7, potassium elevated to 6.  Chest x-ray showed pulmonary edema.  Patient was given IV calcium gluconate, IV Lasix, insulin, dextrose and transferred to [facility]. Admitted to hospitalist service. Nephrology was consulted.  Patient was reinitiated on dialysis.  Subjective: Patient was seen and examined this morning. Sitting up in recliner.  Not in distress. Patient is not happy with restricted diet and wants regular diet. Last dialysis was yesterday.  Assessment and plan: AKI on CKD 5  New ESRD Presented with significantly elevated creatinine to 7.7 and potassium to 6.  Nephrology consulted. 10/28, tunnel catheter placed by IR and underwent first HD, third HD 11/1. Needs vein mapping for permanent access.  Vascular surgery to see.  Acute on chronic heart failure with preserved ejection fraction (HFpEF) Hypotension   History of hypertension. Volume overloaded at the time of admission. Expect improvement with further dialysis. Blood pressure running low between 80s and 100s. Currently on midodrine 3 times daily support to continue HD. PTA on Coreg 25 mg twice  daily, torsemide 80 mg twice daily, Imdur 120 mg daily.  These are currently on hold.  Avoid hypotension.   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 Mentation gradually improving.  Type 2 diabetes mellitus A1c 6.9 on 10/25/2021 PTA on Basaglar.  Currently on hold. Currently blood sugars controlled on sliding scale insulin Accu-Cheks. Gabapentin for neuropathy, currently on hold. Recent Labs  Lab 01/21/22 2019 01/21/22 2319 01/22/22 0404 01/22/22 0736 01/22/22 1212  GLUCAP 121* 131* 118* 116* 145*   Anemia of chronic disease Hemoglobin stable around 9. Monitor. Management per nephrology. No active bleeding. Recent Labs    10/27/21 0600 10/28/21 0754 12/23/21 1326 12/23/21 1802 12/25/21 0044 01/06/22 0046 01/16/22 2304 01/17/22 0347 01/21/22 0414 01/22/22 0326  HGB 7.3*   < > 7.9*  --    < > 7.2* 8.6* 7.9* 8.4* 9.4*  MCV 85.7   < > 84.7  --    < > 85.5 86.0 84.6 85.1 86.4  VITAMINB12 534  --   --   --   --   --   --   --   --   --   FOLATE 16.3  --   --   --   --   --   --   --   --   --   FERRITIN 39  --   --  84  --   --   --   --   --   --   TIBC 413  --   --  308  --   --   --   --   --   --   IRON 37  --   --  23*  --   --   --   --   --   --   RETICCTPCT  --   --  4.5*  --   --   --   --   --   --   --    < > = values in this interval not displayed.   Morbid obesity  -Body mass index is 49.27 kg/m. Patient has been advised to make an attempt to improve diet and exercise patterns to aid in weight loss.  OSA (obstructive sleep apnea) Continue CPAP nightly.   Right foot diabetic ulcer 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 Continue local wound care. Per wound care instructions, daily cleanse, pat dry and application of moisturizer (Eucerin cream) RLE wound will be covered with a folded layer of xeroform gauze topped with dry gauze. Both LEs are to be wrapped with Kerlix roll gauze and  secured with paper tape. Feet are to be placed into Prevalon boots. A sacral silicone foam is to be placed for PI prevention. As needed pain medicines   Rash with antibiotics skin flaking off. Moisturizers   ? Foley catheter Inserted at outside hospital prior to admission on 10/27. We will continue for now for close I and O Voiding trial to be done.  We will check with nephrology   Generalized body ache Most likely in the setting of uremia. Continue Flexeril 5 mg 3 times daily.  Goals of care   Code Status: Full Code    Mobility: Encourage ambulation  Skin assessment:  Pressure Injury 12/28/21 Buttocks Right Stage 2 -  Partial thickness loss of dermis presenting as a shallow open injury with a red, pink wound bed without slough. (Active)  12/28/21 2300  Location: Buttocks  Location Orientation: Right  Staging: Stage 2 -  Partial thickness loss of dermis presenting as a shallow open injury with a red, pink wound bed without slough.  Wound Description (Comments):   Present on Admission: -- (I'm not sure)     Pressure Injury 12/28/21 Left Stage 2 -  Partial thickness loss of dermis presenting as a shallow open injury with a red, pink wound bed without slough. (Active)  12/28/21 2300  Location:   Location Orientation: Left  Staging: Stage 2 -  Partial thickness loss of dermis presenting as a shallow open injury with a red, pink wound bed without slough.  Wound Description (Comments):   Present on Admission: -- (I'm not sure)    Nutritional status:  Body mass index is 49.27 kg/m.          Diet:  Diet Order             Diet regular Room service appropriate? Yes; Fluid consistency: Thin  Diet effective now                   DVT prophylaxis:  heparin injection 5,000 Units Start: 01/17/22 0600   Antimicrobials: None Fluid: None Consultants: Nephrology Family Communication: None at bedside  Status is: Inpatient  Continue in-hospital care because:  Outpatient dialysis chair arrangement Level of care: Progressive   Dispo: The patient is from: Home              Anticipated d/c is to: Pending clinical course              Patient currently is not medically stable to d/c.   Difficult to place patient No     Infusions:  Scheduled Meds:  Chlorhexidine Gluconate Cloth  6 each Topical Q0600   Chlorhexidine Gluconate Cloth  6 each Topical Q0600   cyclobenzaprine  5 mg Oral TID   darbepoetin (ARANESP) injection - NON-DIALYSIS  40 mcg Subcutaneous Q Sun-1800   heparin  5,000 Units Subcutaneous Q8H   hydrocerin   Topical Daily   insulin aspart  0-9 Units Subcutaneous Q4H   midodrine  5 mg Oral TID WC    PRN meds: acetaminophen **OR** acetaminophen, ondansetron **OR** ondansetron (ZOFRAN) IV   Antimicrobials: Anti-infectives (From admission, onward)    Start     Dose/Rate Route Frequency Ordered Stop   01/17/22 1301  ceFAZolin (ANCEF) 2-4 GM/100ML-% IVPB       Note to Pharmacy: Roe Coombs H: cabinet override      01/17/22 1301 01/18/22 0114   01/17/22 1130  ceFAZolin (ANCEF) IVPB 3g/100 mL premix        3 g 200 mL/hr over 30 Minutes Intravenous To Radiology 01/17/22 1044 01/18/22 1130       Objective: Vitals:   01/22/22 0735 01/22/22 1209  BP: (!) 131/48 127/72  Pulse: 88 92  Resp: 15 14  Temp: 97.7 F (36.5 C) 97.8 F (36.6 C)  SpO2: 96% 98%    Intake/Output Summary (Last 24 hours) at 01/22/2022 1455 Last data filed at 01/22/2022 1449 Gross per 24 hour  Intake --  Output 1100 ml  Net -1100 ml   Filed Weights   01/21/22 0820 01/21/22 1238  Weight: (!) 143.8 kg (!) 142.7 kg   Weight change:  Body mass index is 49.27 kg/m.   Physical Exam: General exam: Middle-aged African-American female.  Not in physical distress Skin: No rashes, lesions or ulcers. HEENT: Atraumatic, normocephalic, no obvious bleeding Lungs: Clear to auscultation bilaterally CVS: Regular rate and rhythm, no murmur GI/Abd soft,  nontender, nondistended, bowel sound present CNS: Alert, awake, oriented x3 Psychiatry: Mood appropriate Extremities: 1+ bilateral pedal edema, no calf tenderness  Data Review: I have personally reviewed the laboratory data and studies available.  F/u labs ordered Unresulted Labs (From admission, onward)     Start     Ordered   01/19/22 0500  Renal function panel  Daily at 5am,   R     Question:  Specimen collection method  Answer:  Lab=Lab collect   01/18/22 0900            Signed, Terrilee Croak, MD [facility] 01/22/2022

## 2022-01-22 NOTE — TOC Initial Note (Signed)
Transition of Care [facility]) - Initial/Assessment Note    Patient Details  Name: [name] MRN: [id number] Date of Birth: 1965/11/29  Transition [facility]) CM/SW Contact:    Barton Fanny, [place] Work Phone Number: 01/22/2022, 1:48 PM  Clinical Narrative:                 CSW and MSW intern spoke with patient at bedside to discuss the discharge plan. It has been recommended patient go to SNF at discharge. However, patient states she is hesitant about going to a SNF versus going home and asked that she have some time to think and speak with family about the decision. Patient has a son and daughter, one works days and the other works nights, both living out of town in the [place] area. CSW and MSW intern will follow for decision.   Expected Discharge Plan: Skilled Nursing Facility Barriers to Discharge: Continued Medical Work up, Waiting for outpatient dialysis   Patient Goals and CMS Choice Patient states their goals for this hospitalization and ongoing recovery are:: Return home CMS Medicare.gov Compare Post Acute Care list provided to:: Patient Choice offered to / list presented to : Patient  Expected Discharge Plan and Services Expected Discharge Plan: [place]       Living arrangements for the past 2 months: Single Family Home                                      Prior Living Arrangements/Services Living arrangements for the past 2 months: Single Family Home Lives with:: Self Patient language and need for interpreter reviewed:: Yes Do you feel safe going back to the place where you live?: Yes      Need for Family Participation in Patient Care: Yes (Comment) Care giver support system in place?: No (comment)   Criminal Activity/Legal Involvement Pertinent to Current Situation/Hospitalization: No - Comment as needed  Activities of Daily Living   ADL Screening (condition at time of admission) Patient's cognitive ability adequate to  safely complete daily activities?: No Is the patient deaf or have difficulty hearing?: No Does the patient have difficulty seeing, even when wearing glasses/contacts?: No Does the patient have difficulty concentrating, remembering, or making decisions?: Yes Patient able to express need for assistance with ADLs?: Yes Does the patient have difficulty dressing or bathing?: Yes Independently performs ADLs?: No Does the patient have difficulty walking or climbing stairs?: Yes Weakness of Legs: Both Weakness of Arms/Hands: Both  Permission Sought/Granted                  Emotional Assessment Appearance:: Appears stated age Attitude/Demeanor/Rapport: Guarded Affect (typically observed): Flat Orientation: : Oriented to Self, Oriented to Place, Oriented to  Time, Oriented to Situation Alcohol / Substance Use: Not Applicable Psych Involvement: No (comment)  Admission diagnosis:  Acute kidney failure ([place]) [N17.9] Patient Active Problem List   Diagnosis Date Noted   Hyperkalemia, diminished renal excretion 01/16/2022   Uremic encephalopathy 01/16/2022   Weakness generalized 01/06/2022   Palliative care by specialist 01/06/2022   Hypertensive kidney disease with CKD (chronic kidney disease) stage V ([place]) 70/62/3762   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 12/29/2021   Diabetic foot ulcer ([place]) 12/17/2021   Elevated LFTs    Diabetic infection of right foot ([place])    RVF (right ventricular failure) ([place])    NSTEMI (non-ST elevated myocardial  infarction) ([place]) 10/25/2021   Acute on chronic heart failure with preserved ejection fraction (HFpEF) (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   Acute renal failure ([place]) 07/26/2013   Chronic kidney disease, stage III (moderate) ([place]) 07/26/2013   Vomiting 07/26/2013   Medication management 05/08/2013   Dysthymia 10/28/2012   Acute myocardial infarction, subsequent episode of care ([place]) 06/27/2012   Status post percutaneous transluminal coronary angioplasty 06/27/2012   Obesity, morbid, BMI 50 or higher ([place]) 09/12/2011   OSA (obstructive sleep apnea) 09/12/2011   CHEST PAIN 02/04/2010   Chronic ischemic heart disease 01/24/2010   Congestive heart failure ([place]) 01/24/2010   PALPITATIONS 07/22/2009   Hyperlipidemia associated with type 2 diabetes mellitus ([place]) 05/07/2009   Hypertension associated with diabetes ([place]) 05/07/2009   CAD (coronary artery disease) 05/07/2009   Chronic diastolic heart failure ([place]) 05/07/2009   Other postprocedural status(V45.89) 00/37/0488   Umbilical hernia with gangrene 05/16/2007   PCP:  Jacklynn Ganong, MD Pharmacy:   [place], [place], [place] [address] Phone: [phone] Fax: [phone]  [facility] ([place], [place] [facility] [address] [place] [address] Phone: [phone] Fax: [phone]  [place], [place] [address] Phone: [phone] Fax: [phone]  [facility] [address] [place] [address] Phone: [phone] Fax: [phone]     Social Determinants of Health (SDOH) Interventions    Readmission Risk  Interventions    12/18/2021    2:02 PM 11/05/2021    1:58 PM  Readmission Risk Prevention Plan  Transportation Screening Complete Complete  PCP or Specialist Appt within 5-7 Days  Complete  PCP or Specialist Appt within 3-5 Days Complete   Home Care Screening  Complete  Medication Review (RN CM)  Complete  HRI or Home Care Consult Complete   Social Work Consult for [place] Planning/Counseling Complete   Palliative Care Screening Not Applicable   Medication Review Press photographer) Complete

## 2022-01-23 ENCOUNTER — Encounter (HOSPITAL_COMMUNITY): Payer: Self-pay | Admitting: Internal Medicine

## 2022-01-23 ENCOUNTER — Other Ambulatory Visit: Payer: Self-pay

## 2022-01-23 DIAGNOSIS — N186 End stage renal disease: Secondary | ICD-10-CM

## 2022-01-23 LAB — RENAL FUNCTION PANEL
Albumin: 2.2 g/dL — ABNORMAL LOW (ref 3.5–5.0)
Anion gap: 11 (ref 5–15)
BUN: 46 mg/dL — ABNORMAL HIGH (ref 6–20)
CO2: 26 mmol/L (ref 22–32)
Calcium: 8.5 mg/dL — ABNORMAL LOW (ref 8.9–10.3)
Chloride: 99 mmol/L (ref 98–111)
Creatinine, Ser: 3.35 mg/dL — ABNORMAL HIGH (ref 0.44–1.00)
GFR, Estimated: 15 mL/min — ABNORMAL LOW (ref >60)
Glucose, Bld: 127 mg/dL — ABNORMAL HIGH (ref 70–99)
Phosphorus: 2.9 mg/dL (ref 2.5–4.6)
Potassium: 3.3 mmol/L — ABNORMAL LOW (ref 3.5–5.1)
Sodium: 136 mmol/L (ref 135–145)

## 2022-01-23 LAB — GLUCOSE, CAPILLARY
Glucose-Capillary: 101 mg/dL — ABNORMAL HIGH (ref 70–99)
Glucose-Capillary: 127 mg/dL — ABNORMAL HIGH (ref 70–99)
Glucose-Capillary: 137 mg/dL — ABNORMAL HIGH (ref 70–99)
Glucose-Capillary: 141 mg/dL — ABNORMAL HIGH (ref 70–99)
Glucose-Capillary: 143 mg/dL — ABNORMAL HIGH (ref 70–99)

## 2022-01-23 MED ORDER — HYDROMORPHONE HCL 1 MG/ML IJ SOLN
0.5000 mg | INTRAMUSCULAR | Status: AC | PRN
  Administered 2022-01-23 – 2022-01-24 (×3): 0.5 mg via INTRAVENOUS
  Filled 2022-01-23 (×3): qty 0.5

## 2022-01-23 MED ORDER — HEPARIN SODIUM (PORCINE) 1000 UNIT/ML IJ SOLN
INTRAMUSCULAR | Status: AC
  Filled 2022-01-23: qty 4

## 2022-01-23 MED ORDER — HYDROMORPHONE HCL 1 MG/ML IJ SOLN
0.5000 mg | INTRAMUSCULAR | Status: AC
  Administered 2022-01-23: 0.5 mg via INTRAVENOUS
  Filled 2022-01-23: qty 0.5

## 2022-01-23 MED ORDER — ALBUMIN HUMAN 25 % IV SOLN: 25.0000 g | Freq: Once | INTRAVENOUS | Status: AC

## 2022-01-23 MED ORDER — ALBUMIN HUMAN 25 % IV SOLN
INTRAVENOUS | Status: AC
  Administered 2022-01-23: 25 g via INTRAVENOUS
  Filled 2022-01-23: qty 100

## 2022-01-23 MED ORDER — OXYCODONE HCL 5 MG PO TABS: 5.0000 mg | ORAL_TABLET | ORAL | Status: DC | PRN

## 2022-01-23 MED ORDER — LORAZEPAM 0.5 MG PO TABS
0.5000 mg | ORAL_TABLET | Freq: Three times a day (TID) | ORAL | Status: DC | PRN
  Administered 2022-01-23 – 2022-01-26 (×5): 0.5 mg via ORAL
  Filled 2022-01-23 (×5): qty 1

## 2022-01-23 MED ORDER — POLYETHYLENE GLYCOL 3350 17 G PO PACK: 17.0000 g | PACK | Freq: Every day | ORAL | Status: DC | PRN

## 2022-01-23 MED ORDER — ORAL CARE MOUTH RINSE: 15.0000 mL | OROMUCOSAL | Status: DC | PRN

## 2022-01-23 NOTE — Progress Notes (Signed)
Pt accepted at [facility] on TTS. Pt can start as soon as Tuesday and will need to arrive at 10:15 for 11:00 chair. Met with pt at bedside to discuss details and provide schedule letter. Discussed arrangements with pt briefly but pt was very focused on needing staff to scratch her back. Schedule letter left for pt at bedside. Pt had no further questions. Update provided to attending, nephrologist, RN CM, and CSW. Arrangements added to AVS. Will assist as needed.   Melven Sartorius Renal Navigator [phone]

## 2022-01-23 NOTE — Progress Notes (Signed)
Dressings on bilateral lower legs changed per order. Prevalon boots placed on bilateral feet. Patient tolerated well as nurse was gentle and ensured pain was limited.

## 2022-01-23 NOTE — Progress Notes (Signed)
PROGRESS NOTE  [name]  DOB: 08-Oct-1965  PCP: Jacklynn Ganong, MD ZOX:[id number]  DOA: 01/16/2022  LOS: 7 days  Hospital Day: 8  Brief narrative: [name] is a 56 y.o. female with PMH significant for DM2, HTN, HLD, morbid obesity, OSA, CKD 5, CAD/CABG, chronic diastolic CHF.  Patient was recently hospitalized 9/27 to 10/17 for right foot diabetic ulcer for which podiatry and vascular surgery were consulted. Treated with antibiotics.  Hospitalization was complicated by development of generalized rash likely due to hypotension antibiotics.  Renal function worsened as well and hence patient needed dialysis.  Her renal function improved and she was discharged off dialysis with a creatinine of 2.5 on 10/17. 10/27, patient presented to OSH with altered mental status, lethargy, weakness.  Creatinine was elevated to 7.7, potassium elevated to 6.  Chest x-ray showed pulmonary edema.  Patient was given IV calcium gluconate, IV Lasix, insulin, dextrose and transferred to [facility]. Admitted to hospitalist service. Nephrology was consulted.  Patient was reinitiated on dialysis.  Subjective: Patient was seen and examined this morning in dialysis.  Not in distress.  No new symptoms.  Being prepared for outpatient dialysis  Assessment and plan: AKI on CKD 5  New ESRD Presented with significantly elevated creatinine to 7.7 and potassium to 6.  Nephrology consulted. 10/28, tunnel catheter placed by IR.  Last dialysis this morning. Needs vein mapping for permanent access.  Vascular surgery to see. Patient has poor insight to her issues -high risk of noncompliance.  Acute on chronic heart failure with preserved ejection fraction (HFpEF) Hypotension   History of hypertension. Had volume overload at the time of admission. Expect improvement with further dialysis. Blood pressure running low between 80s and 100s. Currently on midodrine 3 times daily support to continue HD. PTA on Coreg 25 mg  twice daily, torsemide 80 mg twice daily, Imdur 120 mg daily.  These are currently on hold.  Avoid hypotension.   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 Mentation gradually improving.  Type 2 diabetes mellitus A1c 6.9 on 10/25/2021 PTA on Basaglar.  Currently on hold. Currently blood sugars controlled on sliding scale insulin Accu-Cheks. Gabapentin for neuropathy, currently on hold. Recent Labs  Lab 01/22/22 1953 01/22/22 2323 01/23/22 0321 01/23/22 0738 01/23/22 1535  GLUCAP 132* 152* 137* 101* 141*    Anemia of chronic disease Hemoglobin stable around 9. Monitor. Management per nephrology. No active bleeding. Recent Labs    10/27/21 0600 10/28/21 0754 12/23/21 1326 12/23/21 1802 12/25/21 0044 01/06/22 0046 01/16/22 2304 01/17/22 0347 01/21/22 0414 01/22/22 0326  HGB 7.3*   < > 7.9*  --    < > 7.2* 8.6* 7.9* 8.4* 9.4*  MCV 85.7   < > 84.7  --    < > 85.5 86.0 84.6 85.1 86.4  VITAMINB12 534  --   --   --   --   --   --   --   --   --   FOLATE 16.3  --   --   --   --   --   --   --   --   --   FERRITIN 39  --   --  84  --   --   --   --   --   --   TIBC 413  --   --  308  --   --   --   --   --   --   IRON 37  --   --  23*  --   --   --   --   --   --   RETICCTPCT  --   --  4.5*  --   --   --   --   --   --   --    < > = values in this interval not displayed.    Morbid obesity  -Body mass index is 50.03 kg/m. Patient has been advised to make an attempt to improve diet and exercise patterns to aid in weight loss.  OSA (obstructive sleep apnea) Continue CPAP nightly.   Right foot diabetic ulcer 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 Continue local wound care. Per wound care instructions, daily cleanse, pat dry and application of moisturizer (Eucerin cream) RLE wound will be covered with a folded layer of xeroform gauze topped with dry gauze. Both LEs are to be wrapped with Kerlix roll gauze  and secured with paper tape. Feet are to be placed into Prevalon boots. A sacral silicone foam is to be placed for PI prevention. As needed pain medicines   Rash with antibiotics skin flaking off. Moisturizers   ? Foley catheter Inserted at outside hospital prior to admission on 10/27. Discussed with nephrology.  Foley removal ordered.   Generalized body ache Most likely in the setting of uremia. Continue Flexeril 5 mg 3 times daily.  Goals of care   Code Status: Full Code    Mobility: Encourage ambulation  Skin assessment:  Pressure Injury 12/28/21 Buttocks Right Stage 2 -  Partial thickness loss of dermis presenting as a shallow open injury with a red, pink wound bed without slough. (Active)  12/28/21 2300  Location: Buttocks  Location Orientation: Right  Staging: Stage 2 -  Partial thickness loss of dermis presenting as a shallow open injury with a red, pink wound bed without slough.  Wound Description (Comments):   Present on Admission: -- (I'm not sure)     Pressure Injury 12/28/21 Left Stage 2 -  Partial thickness loss of dermis presenting as a shallow open injury with a red, pink wound bed without slough. (Active)  12/28/21 2300  Location:   Location Orientation: Left  Staging: Stage 2 -  Partial thickness loss of dermis presenting as a shallow open injury with a red, pink wound bed without slough.  Wound Description (Comments):   Present on Admission: -- (I'm not sure)    Nutritional status:  Body mass index is 50.03 kg/m.          Diet:  Diet Order             Diet regular Room service appropriate? Yes; Fluid consistency: Thin  Diet effective now                   DVT prophylaxis:  heparin injection 5,000 Units Start: 01/17/22 0600   Antimicrobials: None Fluid: None Consultants: Nephrology Family Communication: None at bedside  Status is: Inpatient  Continue in-hospital care because: Outpatient dialysis chair arrangement Level of care:  Progressive   Dispo: The patient is from: Home              Anticipated d/c is to: SNF recommended by PT              Patient currently is not medically stable to d/c.   Difficult to place patient No     Infusions:    Scheduled Meds:  Chlorhexidine Gluconate Cloth  6 each Topical Q0600  cyclobenzaprine  5 mg Oral TID   darbepoetin (ARANESP) injection - NON-DIALYSIS  40 mcg Subcutaneous Q Sun-1800   heparin  5,000 Units Subcutaneous Q8H   heparin sodium (porcine)       hydrocerin   Topical Daily   insulin aspart  0-9 Units Subcutaneous Q4H   midodrine  5 mg Oral TID WC    PRN meds: acetaminophen **OR** acetaminophen, heparin sodium (porcine), HYDROmorphone (DILAUDID) injection, LORazepam, ondansetron **OR** ondansetron (ZOFRAN) IV, mouth rinse, oxyCODONE, polyethylene glycol   Antimicrobials: Anti-infectives (From admission, onward)    Start     Dose/Rate Route Frequency Ordered Stop   01/17/22 1301  ceFAZolin (ANCEF) 2-4 GM/100ML-% IVPB       Note to Pharmacy: Roe Coombs H: cabinet override      01/17/22 1301 01/18/22 0114   01/17/22 1130  ceFAZolin (ANCEF) IVPB 3g/100 mL premix        3 g 200 mL/hr over 30 Minutes Intravenous To Radiology 01/17/22 1044 01/18/22 1130       Objective: Vitals:   01/23/22 1210 01/23/22 1545  BP: (!) 106/55 (!) 108/44  Pulse: 94 99  Resp: 13 14  Temp:  98.6 F (37 C)  SpO2: 100% 100%    Intake/Output Summary (Last 24 hours) at 01/23/2022 1550 Last data filed at 01/23/2022 1210 Gross per 24 hour  Intake 120 ml  Output 2600 ml  Net -2480 ml    Filed Weights   01/21/22 1238 01/23/22 0811 01/23/22 1228  Weight: (!) 142.7 kg (!) 146.8 kg (!) 144.9 kg   Weight change:  Body mass index is 50.03 kg/m.   Physical Exam: General exam: Middle-aged African-American female.  Not in physical distress. Skin: Generalized flaking of skin noted HEENT: Atraumatic, normocephalic, no obvious bleeding Lungs: Clear to auscultation  bilaterally CVS: Regular rate and rhythm, no murmur GI/Abd soft, nontender, nondistended, bowel sound present CNS: Alert, awake, oriented x3 Psychiatry: Mood appropriate Extremities: 1+ bilateral pedal edema, no calf tenderness  Data Review: I have personally reviewed the laboratory data and studies available.  F/u labs ordered Unresulted Labs (From admission, onward)     Start     Ordered   01/19/22 0500  Renal function panel  Daily at 5am,   R     Question:  Specimen collection method  Answer:  Lab=Lab collect   01/18/22 0900            Signed, Terrilee Croak, MD [facility] 01/23/2022

## 2022-01-23 NOTE — Progress Notes (Signed)
Received patient in bed to unit. Alert and orientedx4 Informed consent signed and in chart 01/17/22   Treatment initiated: 0809 Treatment completed: 1158   Patient tolerated well. Transported back to the room Alert, without acute distress. Hand-off given to patient's nurse.   Access used: dialysis cath Access issues: none Dressing: tegaderm with biopatch , dressing change due on 01/28/22 Total UF removed: 2L Medication(s) given: 25g albumin  Post HD VS: see table below.  Post HD weight: 144.9kg using bed scale.   01/23/22 1210  Vitals  BP (!) 106/55  MAP (mmHg) 71  BP Location Right Arm  BP Method Automatic  Patient Position (if appropriate) Lying  Pulse Rate 94  Pulse Rate Source Monitor  ECG Heart Rate 93  Resp 13  Oxygen Therapy  SpO2 100 %  O2 Device Room Air  Patient Activity (if Appropriate) In bed  Pulse Oximetry Type Continuous  During Treatment Monitoring  Intra-Hemodialysis Comments Tx completed  Post Treatment  Dialyzer Clearance Lightly streaked  Duration of HD Treatment -hour(s) 3.5 hour(s)  Liters Processed 84  Fluid Removed 2000 mL  Tolerated HD Treatment Yes  Post-Hemodialysis Comments  (TX tolerated well)  Note  Observations tx tolerated well         [facility]

## 2022-01-23 NOTE — TOC Progression Note (Addendum)
Transition of Care [facility]) - Progression Note    Patient Details  Name: [name] MRN: [id number] Date of Birth: September 04, 1965  Transition of Care [facility]) CM/SW [place], LCSW Phone Number: 01/23/2022, 3:27 PM  Clinical Narrative:    CSW and MSW Intern met with patient to see if she has decided to go to SNF. She stated she does not feel well and is still not sure about SNF, though she told her children that she never wanted to go to one. She again stated she did not want to be stuck there and CSW again explained that insurance would not approve her for more than 20 days likely anyway and she does not have Medicaid in place to pay for long term care. Patient stated she will continue to think about it. CSW let her know that we will go ahead and send referrals anyway so that we can be prepared if she decides on SNF. Patient hesitant about that but agreed.    Expected Discharge Plan: Skilled Nursing Facility Barriers to Discharge: Continued Medical Work up, Waiting for outpatient dialysis  Expected Discharge Plan and Services Expected Discharge Plan: [place] arrangements for the past 2 months: Single Family Home                                       Social Determinants of Health (SDOH) Interventions    Readmission Risk Interventions    12/18/2021    2:02 PM 11/05/2021    1:58 PM  Readmission Risk Prevention Plan  Transportation Screening Complete Complete  PCP or Specialist Appt within 5-7 Days  Complete  PCP or Specialist Appt within 3-5 Days Complete   Home Care Screening  Complete  Medication Review (RN CM)  Complete  HRI or Home Care Consult Complete   Social Work Consult for [place] Planning/Counseling Complete   Palliative Care Screening Not Applicable   Medication Review Press photographer) Complete

## 2022-01-23 NOTE — Progress Notes (Addendum)
New chest x-ray results faxed to clinic (per their request) for review. Hopefully pt can be medically cleared for admission once result reviewed by provider/staff.   Melven Sartorius Renal Navigator [phone]  Addendum at 11:30: Clinic inquiring if pt can sit for HD. Advised clinic pt able to ambulate short distances with PT yesterday per PT notes. PT note from yesterday regarding ambulation faxed to clinic for review per their request.

## 2022-01-23 NOTE — NC FL2 (Addendum)
[place] MEDICAID FL2 LEVEL OF CARE SCREENING TOOL     IDENTIFICATION  Patient Name: [name] Birthdate: 1965-09-17 Sex: female Admission Date (Current Location): 01/16/2022  [facility] and [place] Number:  Herbalist and Address:  [facility], [place] 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 ([place]) 12/87/8676   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 12/29/2021   Diabetic foot ulcer ([place]) 12/17/2021   Elevated LFTs    Diabetic infection of right foot ([place])    RVF (right ventricular failure) ([place])    NSTEMI (non-ST elevated myocardial infarction) ([place]) 10/25/2021   Acute on chronic heart failure with preserved ejection fraction (HFpEF) ([place]) 10/25/2021   Acute renal failure superimposed on stage 4 chronic kidney disease ([place]) 10/25/2021   Wound of right foot 10/25/2021   Insulin dependent type 2 diabetes mellitus ([place]) 10/25/2021   Anemia of chronic disease 10/25/2021   Chest pain 10/01/2018   Screening for cervical cancer 03/14/2018   Atherosclerosis 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 ([place]) 08/12/2017   Diarrhea 05/20/2016   Controlled substance agreement signed 01/07/2016   Chronic back pain 08/25/2013   Gastroenteritis 07/31/2013    Gout 07/31/2013   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   Acute renal failure ([place]) 07/26/2013   Chronic kidney disease, stage III (moderate) (HCC) 07/26/2013   Vomiting 07/26/2013   Medication management 05/08/2013   Dysthymia 10/28/2012   Acute myocardial infarction, subsequent episode of care ([place]) 06/27/2012   Status post percutaneous transluminal coronary angioplasty 06/27/2012   Obesity, morbid, BMI 50 or higher ([place]) 09/12/2011   OSA (obstructive sleep apnea) 09/12/2011   CHEST PAIN 02/04/2010   Chronic ischemic heart disease 01/24/2010   Congestive heart failure ([place]) 01/24/2010   PALPITATIONS 07/22/2009   Hyperlipidemia associated with type 2 diabetes mellitus ([place]) 05/07/2009   Hypertension associated with diabetes ([place]) 05/07/2009   CAD (coronary artery disease) 05/07/2009   Chronic diastolic heart failure ([place]) 05/07/2009   Other postprocedural status(V45.89) 72/11/4707   Umbilical hernia with gangrene 05/16/2007    Orientation RESPIRATION BLADDER Height & Weight     Self, Time, Situation, Place  Normal Incontinent, Indwelling catheter Weight: (!) 319 lb 7.1 oz (144.9 kg) (bed scale) Height:  '5\' 7"'$  (170.2 cm)  BEHAVIORAL SYMPTOMS/MOOD NEUROLOGICAL BOWEL NUTRITION STATUS      Incontinent Diet (See discharge summary.)  AMBULATORY STATUS COMMUNICATION OF NEEDS Skin   Extensive Assist Verbally PU Stage and Appropriate Care, Surgical wounds   PU Stage 2 Dressing: Daily                   Personal Care Assistance Level of Assistance  Bathing, Feeding, Dressing Bathing Assistance: Maximum assistance Feeding assistance: Maximum assistance Dressing Assistance: Maximum assistance     Functional Limitations Info  Sight, Hearing Sight Info: Impaired Hearing  Info: Adequate      SPECIAL CARE FACTORS FREQUENCY  PT (By licensed PT), OT (By licensed OT)     PT Frequency: 5x/week OT Frequency: 5x/week            Contractures Contractures Info: Not present     Additional Factors Info  Code Status, Allergies Code Status Info: Full Code Allergies Info: Bactrim, Diphenhydramine-acetaminophen, Benadryl, Cephalosporins, Ms Contin, Tylenol PM Extra, Valium, Vncomycin, Ceftriaxone, Cipro, Latex, Levaquin           Current Medications (01/23/2022):  This is the current hospital active medication list Current Facility-Administered Medications  Medication Dose Route Frequency Provider Last Rate Last Admin   acetaminophen (TYLENOL) tablet 650 mg  650 mg Oral Q6H PRN Etta Quill, DO   650 mg at 01/22/22 2155   Or   acetaminophen (TYLENOL) suppository 650 mg  650 mg Rectal Q6H PRN Etta Quill, DO       Chlorhexidine Gluconate Cloth 2 % PADS 6 each  6 each Topical Q0600 Claudia Desanctis, MD   6 each at 01/23/22 0603   cyclobenzaprine (FLEXERIL) tablet 5 mg  5 mg Oral TID Lavina Hamman, MD   5 mg at 01/23/22 1542   Darbepoetin Alfa (ARANESP) injection 40 mcg  40 mcg Subcutaneous Q Sun-1800 Claudia Desanctis, MD   40 mcg at 01/18/22 2049   heparin injection 5,000 Units  5,000 Units Subcutaneous Q8H Jennette Kettle M, DO   5,000 Units at 01/23/22 1410   hydrocerin (EUCERIN) cream   Topical Daily Lorella Nimrod, MD   Given at 01/22/22 1048   HYDROmorphone (DILAUDID) injection 0.5 mg  0.5 mg Intravenous Q4H PRN Irene Pap N, DO   0.5 mg at 01/23/22 1410   insulin aspart (novoLOG) injection 0-9 Units  0-9 Units Subcutaneous Q4H Jennette Kettle M, DO   1 Units at 01/23/22 1537   LORazepam (ATIVAN) tablet 0.5 mg  0.5 mg Oral Q8H PRN Dahal, Marlowe Aschoff, MD   0.5 mg at 01/23/22 1639   midodrine (PROAMATINE) tablet 5 mg  5 mg Oral TID WC Harrie Jeans C, MD   5 mg at 01/23/22 1313   ondansetron (ZOFRAN) tablet 4 mg  4 mg Oral Q6H PRN Etta Quill, DO   4 mg at 01/23/22 1410   Or   ondansetron (ZOFRAN) injection 4 mg  4 mg Intravenous Q6H PRN Etta Quill, DO   4 mg at 01/22/22 0220   Oral care mouth rinse  15 mL Mouth Rinse PRN Dahal, Marlowe Aschoff, MD        oxyCODONE (Oxy IR/ROXICODONE) immediate release tablet 5 mg  5 mg Oral Q4H PRN Irene Pap N, DO       polyethylene glycol (MIRALAX / GLYCOLAX) packet 17 g  17 g Oral Daily PRN Kayleen Memos, DO         Discharge Medications: Please see discharge summary for a list of discharge medications.  Relevant Imaging Results:  Relevant Lab Results:   Additional Information SSN: [id number]; Requires transportation to outpatient dialysis, [facility] MWF 10:45am chair time.  Benard Halsted, LCSW

## 2022-01-23 NOTE — Progress Notes (Signed)
[facility] Progress Note   Assessment/ Plan:   # New ESRD - AKI on CKD stage IV/V now with progression to ESRD.  Previously palliative care involved and she did ulimately start HD last admission - per charting she just had hoped she would never need it and then they hoped had she enough renal function to come off.  First HD on 10/28 after tunn catheter with IR   - HD on 10/30 HD #2, HD #3 11/1 - next HD Friday and then MWF schedule thereafter -  CLIP needed- in process - needs perm access- ordered vein mapping and VVS consult, she did not engage in access discussion yesterday.  Can follow up as OP. - she has very poor insight into her condition--> I am hoping her brother, who is on dialysis in 84 and who has been present for conversations related to her ESRD, can be a resource for her.  I am concerned that she will be poorly adherent to her dialysis and then will have a bad outcome     # Hyperkalemia - resolved   # Confusion/encephalopathy - Concerning for uremia in the setting of CKD progression - now on HD - improved   # Hypotension - Hx of HTN - avoid hypotension - would hold imdur and coreg - Continue midodrine 5 mg TID (new med this admit)   # Chronic diastolic CHF  - holding imdur and coreg   - optimize volume status with HD    # Anemia CKD  - iron deficiency - repleted earlier this mont per charting - Will start aranesp 40 mcg weekly on Sundays for now      Disposition - CLIP in process  Subjective:    Seen on dialysis.  No needs expressed.     Objective:   BP 122/72 (BP Location: Right Arm)   Pulse 87   Temp 98.6 F (37 C) (Oral)   Resp 13   Ht '5\' 7"'$  (1.702 m)   Wt (!) 146.8 kg   SpO2 100%   BMI 50.69 kg/m   Physical Exam: [id number] in chair CVS: RRR Resp: clear Abd: soft Ext: 1+ LE edema ACCESS: R IJ TDC  Labs: BMET Recent Labs  Lab 01/16/22 2304 01/17/22 0347 01/17/22 0747 01/18/22 0917 01/19/22 0308  01/21/22 0414 01/22/22 0326 01/23/22 0244  NA 138 134*  --  137 137 137 135 136  K 6.4* 5.5* 5.6* 4.8 4.6 3.4* 3.5 3.3*  CL 103 98  --  99 99 100 99 99  CO2 23 22  --  '23 23 26 25 26  '$ GLUCOSE 155* 187*  --  98 115* 129* 111* 127*  BUN 116* 115*  --  111* 115* 75* 43* 46*  CREATININE 8.43* 8.15*  --  7.88* 8.14* 5.64* 3.51* 3.35*  CALCIUM 8.8* 8.3*  --  8.0* 8.2* 8.7* 8.5* 8.5*  PHOS 7.8*  --   --  6.6* 6.6* 4.5 3.2 2.9   CBC Recent Labs  Lab 01/16/22 2304 01/17/22 0347 01/21/22 0414 01/22/22 0326  WBC 8.1 8.0 10.1 11.2*  NEUTROABS 5.0  --   --   --   HGB 8.6* 7.9* 8.4* 9.4*  HCT 27.1* 24.7* 26.9* 30.6*  MCV 86.0 84.6 85.1 86.4  PLT 204 195 176 167      Medications:     Chlorhexidine Gluconate Cloth  6 each Topical Q0600   cyclobenzaprine  5 mg Oral TID   darbepoetin (ARANESP) injection - NON-DIALYSIS  40  mcg Subcutaneous Q Sun-1800   heparin  5,000 Units Subcutaneous Q8H   heparin sodium (porcine)       hydrocerin   Topical Daily   insulin aspart  0-9 Units Subcutaneous Q4H   midodrine  5 mg Oral TID WC     [name] MD 01/23/2022, 11:22 AM

## 2022-01-23 NOTE — Procedures (Signed)
Patient seen and examined on Hemodialysis. BP 122/72 (BP Location: Right Arm)   Pulse 87   Temp 98.6 F (37 C) (Oral)   Resp 13   Ht '5\' 7"'$  (1.702 m)   Wt (!) 146.8 kg   SpO2 100%   BMI 50.69 kg/m   QB 400 mL/ min via TDC, UF goal 2L  Tolerating treatment without complaints at this time.   [name] MD [facility] [phone] 11:25 AM

## 2022-01-24 ENCOUNTER — Inpatient Hospital Stay (HOSPITAL_COMMUNITY): Payer: Medicare Other

## 2022-01-24 DIAGNOSIS — N186 End stage renal disease: Secondary | ICD-10-CM | POA: Diagnosis not present

## 2022-01-24 DIAGNOSIS — Z992 Dependence on renal dialysis: Secondary | ICD-10-CM

## 2022-01-24 LAB — CBC
HCT: 30.8 % — ABNORMAL LOW (ref 36.0–46.0)
Hemoglobin: 9.3 g/dL — ABNORMAL LOW (ref 12.0–15.0)
MCH: 26.6 pg (ref 26.0–34.0)
MCHC: 30.2 g/dL (ref 30.0–36.0)
MCV: 88 fL (ref 80.0–100.0)
Platelets: 166 10*3/uL (ref 150–400)
RBC: 3.5 MIL/uL — ABNORMAL LOW (ref 3.87–5.11)
RDW: 16.7 % — ABNORMAL HIGH (ref 11.5–15.5)
WBC: 13.5 10*3/uL — ABNORMAL HIGH (ref 4.0–10.5)
nRBC: 0.1 % (ref 0.0–0.2)

## 2022-01-24 LAB — GLUCOSE, CAPILLARY
Glucose-Capillary: 129 mg/dL — ABNORMAL HIGH (ref 70–99)
Glucose-Capillary: 132 mg/dL — ABNORMAL HIGH (ref 70–99)
Glucose-Capillary: 154 mg/dL — ABNORMAL HIGH (ref 70–99)
Glucose-Capillary: 155 mg/dL — ABNORMAL HIGH (ref 70–99)
Glucose-Capillary: 163 mg/dL — ABNORMAL HIGH (ref 70–99)
Glucose-Capillary: 166 mg/dL — ABNORMAL HIGH (ref 70–99)

## 2022-01-24 LAB — RENAL FUNCTION PANEL
Albumin: 2.5 g/dL — ABNORMAL LOW (ref 3.5–5.0)
Anion gap: 9 (ref 5–15)
BUN: 37 mg/dL — ABNORMAL HIGH (ref 6–20)
CO2: 25 mmol/L (ref 22–32)
Calcium: 8.6 mg/dL — ABNORMAL LOW (ref 8.9–10.3)
Chloride: 99 mmol/L (ref 98–111)
Creatinine, Ser: 2.8 mg/dL — ABNORMAL HIGH (ref 0.44–1.00)
GFR, Estimated: 19 mL/min — ABNORMAL LOW (ref >60)
Glucose, Bld: 169 mg/dL — ABNORMAL HIGH (ref 70–99)
Phosphorus: 3 mg/dL (ref 2.5–4.6)
Potassium: 3.9 mmol/L (ref 3.5–5.1)
Sodium: 133 mmol/L — ABNORMAL LOW (ref 135–145)

## 2022-01-24 LAB — C-REACTIVE PROTEIN: CRP: 9.8 mg/dL — ABNORMAL HIGH (ref <1.0)

## 2022-01-24 LAB — MRSA NEXT GEN BY PCR, NASAL: MRSA by PCR Next Gen: NOT DETECTED

## 2022-01-24 LAB — BRAIN NATRIURETIC PEPTIDE: B Natriuretic Peptide: >4500 pg/mL — ABNORMAL HIGH (ref 0.0–100.0)

## 2022-01-24 LAB — PROCALCITONIN: Procalcitonin: 0.32 ng/mL

## 2022-01-24 MED ORDER — TRAMADOL HCL 50 MG PO TABS
50.0000 mg | ORAL_TABLET | Freq: Once | ORAL | Status: DC
  Filled 2022-01-24: qty 1

## 2022-01-24 MED ORDER — HYDROMORPHONE HCL 1 MG/ML IJ SOLN
0.5000 mg | INTRAMUSCULAR | Status: AC | PRN
  Administered 2022-01-24 – 2022-01-25 (×2): 0.5 mg via INTRAVENOUS
  Filled 2022-01-24 (×2): qty 0.5

## 2022-01-24 NOTE — Progress Notes (Signed)
Dr. Candiss Norse was made aware of pt''s bladder scan results of 80ms. No further orders received at this time. I will report off to oncoming shift RN.

## 2022-01-24 NOTE — Progress Notes (Signed)
Received new order for patient, patient already on caseload and scheduled to be seen first part of next week.  [place], OTR/L [facility] [phone] Office [phone]

## 2022-01-24 NOTE — Progress Notes (Signed)
[facility] Progress Note   Assessment/ Plan:   # New ESRD - AKI on CKD stage IV/V now with progression to ESRD.  Previously palliative care involved and she did ulimately start HD last admission - per charting she just had hoped she would never need it and then they hoped had she enough renal function to come off.  First HD on 10/28 after tunn catheter with IR   - HD on 10/30 HD #2, HD #3 11/1 - next HD Friday and then MWF schedule thereafter -  CLIP needed- in process - needs perm access- ordered vein mapping and VVS consult, she did not engage in access discussion.  Can follow up as OP. - she has very poor insight into her condition--> I am hoping her brother, who is on dialysis in 66 and who has been present for conversations related to her ESRD, can be a resource for her.  I am concerned that she will be poorly adherent to her dialysis and then will have a bad outcome - d/c foley     # Hyperkalemia - resolved   # Confusion/encephalopathy - Concerning for uremia in the setting of CKD progression - now on HD - improved   # Hypotension - Hx of HTN - avoid hypotension - would hold imdur and coreg - Continue midodrine 5 mg TID (new med this admit)   # Chronic diastolic CHF  - holding imdur and coreg   - optimize volume status with HD    # Anemia CKD  - iron deficiency - repleted earlier this mont per charting - Will start aranesp 40 mcg weekly on Sundays for now      Disposition - CLIP in process  Subjective:   Seen in room, sleeping, arousable, no needs expressed   Objective:   BP 122/73 (BP Location: Left Arm)   Pulse 100   Temp 98.2 F (36.8 C)   Resp 14   Ht '5\' 7"'$  (1.702 m)   Wt (!) 144.9 kg Comment: bed scale  SpO2 99%   BMI 50.03 kg/m   Physical Exam: Gen: lying in bed CVS: RRR Resp: clear Abd: soft Ext: 1+ LE edema ACCESS: R IJ [facility]  Labs: BMET Recent Labs  Lab 01/18/22 0917 01/19/22 0308 01/21/22 0414 01/22/22 0326  01/23/22 0244 01/24/22 0351  NA 137 137 137 135 136 133*  K 4.8 4.6 3.4* 3.5 3.3* 3.9  CL 99 99 100 99 99 99  CO2 '23 23 26 25 26 25  '$ GLUCOSE 98 115* 129* 111* 127* 169*  BUN 111* 115* 75* 43* 46* 37*  CREATININE 7.88* 8.14* 5.64* 3.51* 3.35* 2.80*  CALCIUM 8.0* 8.2* 8.7* 8.5* 8.5* 8.6*  PHOS 6.6* 6.6* 4.5 3.2 2.9 3.0   CBC Recent Labs  Lab 01/21/22 0414 01/22/22 0326 01/24/22 0614  WBC 10.1 11.2* 13.5*  HGB 8.4* 9.4* 9.3*  HCT 26.9* 30.6* 30.8*  MCV 85.1 86.4 88.0  PLT 176 167 166      Medications:     Chlorhexidine Gluconate Cloth  6 each Topical Q0600   cyclobenzaprine  5 mg Oral TID   darbepoetin (ARANESP) injection - NON-DIALYSIS  40 mcg Subcutaneous Q Sun-1800   heparin  5,000 Units Subcutaneous Q8H   hydrocerin   Topical Daily   insulin aspart  0-9 Units Subcutaneous Q4H   midodrine  5 mg Oral TID WC     [name] MD 01/24/2022, 10:43 AM

## 2022-01-24 NOTE — Plan of Care (Signed)

## 2022-01-24 NOTE — Progress Notes (Signed)
Pt refused to have foleys removed.Tried convincing 2 times .Explained about the side effects and  infection.Will try convincing again.Informed MD

## 2022-01-24 NOTE — Progress Notes (Addendum)
PROGRESS NOTE  [name]  DOB: 12-Oct-1965  PCP: Jacklynn Ganong, MD OXB:[id number]  DOA: 01/16/2022  LOS: 8 days  Hospital Day: 9  Brief narrative: [name] is a 56 y.o. female with PMH significant for DM2, HTN, HLD, morbid obesity, OSA, CKD 5, CAD/CABG, chronic diastolic CHF.  Patient was recently hospitalized 9/27 to 10/17 for right foot diabetic ulcer for which podiatry and vascular surgery were consulted. Treated with antibiotics.  Hospitalization was complicated by development of generalized rash likely due to hypotension antibiotics.  Renal function worsened as well and hence patient needed dialysis.  Her renal function improved and she was discharged off dialysis with a creatinine of 2.5 on 10/17. 10/27, patient presented to OSH with altered mental status, lethargy, weakness.  Creatinine was elevated to 7.7, potassium elevated to 6.  Chest x-ray showed pulmonary edema.  Patient was given IV calcium gluconate, IV Lasix, insulin, dextrose and transferred to [facility]. Admitted to hospitalist service. Nephrology was consulted.  Patient was reinitiated on dialysis.   Subjective:   Patient in bed, appears comfortable, denies any headache, no fever, no chest pain or pressure, no shortness of breath , no abdominal pain. No new focal weakness.   Assessment and plan:  AKI on CKD 5, New ESRD - Presented with significantly elevated creatinine to 7.7 and potassium to 6.  Nephrology consulted. 10/28/2, tunnel catheter placed by IR.  Last dialysis this morning. Needs vein mapping for permanent access.  Vascular surgery to see. Patient has poor insight to her issues -high risk of noncompliance.  Acute on chronic heart failure with preserved ejection fraction (HFpEF), Hypotension , History of hypertension - Had volume overload at the time of admission.  Fluid removal through HD.  Midodrine for blood pressure support.  Chronic heart medications which are Coreg, Demadex and Imdur on hold due  to hypotension.    Fever early morning 01/24/2022.  Chest x-ray stable, had a indwelling Foley which was removed, blood cultures drawn.  Appears nontoxic.  Monitor closely.    Encephalopathy - Patient has uremia and OSA causing encephalopathy. Mentation gradually improving.  Anemia of chronic disease - Hemoglobin stable around 9.  No acute issue.   Morbid obesity - -Body mass index is 50.03 kg/m. Patient has been advised to make an attempt to improve diet and exercise patterns to aid in weight loss.  OSA (obstructive sleep apnea)  - Continue CPAP nightly.   Right foot diabetic ulcer, 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 - Continue local wound care. Per wound care instructions, daily cleanse, pat dry and application of moisturizer (Eucerin cream) RLE wound will be covered with a folded layer of xeroform gauze topped with dry gauze. Both LEs are to be wrapped with Kerlix roll gauze and secured with paper tape. Feet are to be placed into Prevalon boots. A sacral silicone foam is to be placed for PI prevention. As needed pain medicines   Rash with antibiotics - skin flaking off. Moisturizers   ? Foley catheter - Inserted at outside hospital prior to admission on 01/16/22 . Discussed with nephrology.  Foley removal ordered.   Generalized body ache - Most likely in the setting of uremia. Improved with supportive Rx.   Type 2 diabetes mellitus with diabetic peripheral neuropathy- A1c 6.9 on 10/25/2021, PTA on Basaglar.  Currently on hold. Currently blood sugars controlled on sliding scale insulin Accu-Cheks. Gabapentin for neuropathy, currently on hold.  CBG (last 3)  Recent Labs  01/23/22 2330 01/24/22 0337 01/24/22 0800  GLUCAP 143* 166* 155*      Goals of care   Code Status: Full Code    Mobility: Encourage ambulation  Skin assessment:  Pressure Injury 12/28/21 Buttocks Right Stage 2 -  Partial thickness loss of dermis presenting as a  shallow open injury with a red, pink wound bed without slough. (Active)  12/28/21 2300  Location: Buttocks  Location Orientation: Right  Staging: Stage 2 -  Partial thickness loss of dermis presenting as a shallow open injury with a red, pink wound bed without slough.  Wound Description (Comments):   Present on Admission: -- (I'm not sure)     Pressure Injury 12/28/21 Left Stage 2 -  Partial thickness loss of dermis presenting as a shallow open injury with a red, pink wound bed without slough. (Active)  12/28/21 2300  Location:   Location Orientation: Left  Staging: Stage 2 -  Partial thickness loss of dermis presenting as a shallow open injury with a red, pink wound bed without slough.  Wound Description (Comments):   Present on Admission: -- (I'm not sure)    Nutritional status:  Body mass index is 50.03 kg/m.       Active Pressure Injury/Wound(s)     Pressure Ulcer  Duration          Pressure Injury 12/28/21 Buttocks Right Stage 2 -  Partial thickness loss of dermis presenting as a shallow open injury with a red, pink wound bed without slough. 26 days   Pressure Injury 12/28/21 Left Stage 2 -  Partial thickness loss of dermis presenting as a shallow open injury with a red, pink wound bed without slough. 26 days             Diet:  Diet Order             Diet regular Room service appropriate? Yes; Fluid consistency: Thin  Diet effective now                   DVT prophylaxis:  heparin injection 5,000 Units Start: 01/17/22 0600    Fluid: None Consultants: Nephrology Family Communication: None at bedside  Status is: Inpatient  Continue in-hospital care because: Outpatient dialysis chair arrangement Level of care: Progressive   Dispo: The patient is from: Home              Anticipated d/c is to: SNF recommended by PT              Patient currently is not medically stable to d/c.   Difficult to place patient No     Infusions:    Scheduled Meds:   Chlorhexidine Gluconate Cloth  6 each Topical Q0600   cyclobenzaprine  5 mg Oral TID   darbepoetin (ARANESP) injection - NON-DIALYSIS  40 mcg Subcutaneous Q Sun-1800   heparin  5,000 Units Subcutaneous Q8H   hydrocerin   Topical Daily   insulin aspart  0-9 Units Subcutaneous Q4H   midodrine  5 mg Oral TID WC    PRN meds: acetaminophen **OR** acetaminophen, LORazepam, ondansetron **OR** ondansetron (ZOFRAN) IV, mouth rinse, oxyCODONE, polyethylene glycol   Objective: Vitals:   01/24/22 0803 01/24/22 0818  BP: (!) 97/43 122/73  Pulse: 100 100  Resp: 12 14  Temp: 98.4 F (36.9 C) 98.2 F (36.8 C)  SpO2: 100% 99%    Intake/Output Summary (Last 24 hours) at 01/24/2022 1212 Last data filed at 01/24/2022 0646 Gross per 24 hour  Intake  480 ml  Output 1500 ml  Net -1020 ml   Filed Weights   01/21/22 1238 01/23/22 0811 01/23/22 1228  Weight: (!) 142.7 kg (!) 146.8 kg (!) 144.9 kg   Weight change:  Body mass index is 50.03 kg/m.   Physical Exam:  Awake Alert, No new F.N deficits, Normal affect [place].AT,PERRAL Supple Neck, No JVD,   Symmetrical Chest wall movement, Good air movement bilaterally, CTAB RRR,No Gallops, Rubs or new Murmurs,  +ve B.Sounds, Abd Soft, No tenderness,   2+ lower extremity edema, bilateral Unna boots, chronic lichenification in her skin mostly in the lower extremities, right IJ HD catheter in place site clean.   Data Review: I have personally reviewed the laboratory data and studies available.  Recent Labs  Lab 01/21/22 0414 01/22/22 0326 01/24/22 0614  WBC 10.1 11.2* 13.5*  HGB 8.4* 9.4* 9.3*  HCT 26.9* 30.6* 30.8*  PLT 176 167 166  MCV 85.1 86.4 88.0  MCH 26.6 26.6 26.6  MCHC 31.2 30.7 30.2  RDW 17.2* 17.0* 16.7*    Recent Labs  Lab 01/19/22 0308 01/21/22 0414 01/22/22 0326 01/23/22 0244 01/24/22 0351 01/24/22 0614  NA 137 137 135 136 133*  --   K 4.6 3.4* 3.5 3.3* 3.9  --   CL 99 100 99 99 99  --   CO2 '23 26 25 26 25  '$ --    GLUCOSE 115* 129* 111* 127* 169*  --   BUN 115* 75* 43* 46* 37*  --   CREATININE 8.14* 5.64* 3.51* 3.35* 2.80*  --   CALCIUM 8.2* 8.7* 8.5* 8.5* 8.6*  --   ALBUMIN 2.0* 2.0* 2.2* 2.2* 2.5*  --   PHOS 6.6* 4.5 3.2 2.9 3.0  --   CRP  --   --   --   --   --  9.8*  PROCALCITON  --   --   --   --   --  0.32  BNP  --   --   --   --   --  >4,500.0*        Signature  Lala Lund M.D on 01/24/2022 at 12:13 PM   -  To page go to [email or web address]

## 2022-01-24 NOTE — Progress Notes (Signed)
   01/24/22 0402  Assess: MEWS Score  Temp (!) 100.5 F (38.1 C)  BP (!) 114/53  MAP (mmHg) 73  Resp 16  O2 Device Nasal Cannula  Patient Activity (if Appropriate) In bed  O2 Flow Rate (L/min) 2 L/min  Assess: MEWS Score  MEWS Temp 1  MEWS Systolic 0  MEWS Pulse 1  MEWS RR 0  MEWS LOC 0  MEWS Score 2  MEWS Score Color Yellow  Assess: if the MEWS score is Yellow or Red  Were vital signs taken at a resting state? Yes  Focused Assessment No change from prior assessment  Does the patient meet 2 or more of the SIRS criteria? Yes  Does the patient have a confirmed or suspected source of infection? No  MEWS guidelines implemented *See Row Information* Yes  Treat  MEWS Interventions Escalated (See documentation below)  Pain Scale 0-10  Pain Score 0  Take Vital Signs  Increase Vital Sign Frequency  Yellow: Q 2hr X 2 then Q 4hr X 2, if remains yellow, continue Q 4hrs  Escalate  MEWS: Escalate Yellow: discuss with charge nurse/RN and consider discussing with provider and RRT  Notify: Charge Nurse/RN  Name of Charge Nurse/RN Notified Nikki, RN  Date Charge Nurse/RN Notified 01/24/22  Time Charge Nurse/RN Notified 0430  Document  Patient Outcome Stabilized after interventions  Progress note created (see row info) Yes  Assess: SIRS CRITERIA  SIRS Temperature  0  SIRS Pulse 1  SIRS Respirations  0  SIRS WBC 1  SIRS Score Sum  2

## 2022-01-24 NOTE — Progress Notes (Signed)
Verbal order Tab.Tramadol '50mg'$  PO one time dose received from Dr.Prashant K Singh,MD.Cosign required

## 2022-01-25 ENCOUNTER — Inpatient Hospital Stay (HOSPITAL_COMMUNITY): Payer: Medicare Other

## 2022-01-25 DIAGNOSIS — N186 End stage renal disease: Secondary | ICD-10-CM | POA: Diagnosis not present

## 2022-01-25 DIAGNOSIS — E11621 Type 2 diabetes mellitus with foot ulcer: Secondary | ICD-10-CM | POA: Diagnosis not present

## 2022-01-25 DIAGNOSIS — Z992 Dependence on renal dialysis: Secondary | ICD-10-CM | POA: Diagnosis not present

## 2022-01-25 DIAGNOSIS — L97509 Non-pressure chronic ulcer of other part of unspecified foot with unspecified severity: Secondary | ICD-10-CM | POA: Diagnosis not present

## 2022-01-25 LAB — CBC WITH DIFFERENTIAL/PLATELET
Abs Immature Granulocytes: 0.1 10*3/uL — ABNORMAL HIGH (ref 0.00–0.07)
Basophils Absolute: 0.1 10*3/uL (ref 0.0–0.1)
Basophils Relative: 1 %
Eosinophils Absolute: 0.1 10*3/uL (ref 0.0–0.5)
Eosinophils Relative: 0 %
HCT: 27.5 % — ABNORMAL LOW (ref 36.0–46.0)
Hemoglobin: 8.7 g/dL — ABNORMAL LOW (ref 12.0–15.0)
Immature Granulocytes: 1 %
Lymphocytes Relative: 29 %
Lymphs Abs: 4 10*3/uL (ref 0.7–4.0)
MCH: 27.3 pg (ref 26.0–34.0)
MCHC: 31.6 g/dL (ref 30.0–36.0)
MCV: 86.2 fL (ref 80.0–100.0)
Monocytes Absolute: 1.2 10*3/uL — ABNORMAL HIGH (ref 0.1–1.0)
Monocytes Relative: 9 %
Neutro Abs: 8.2 10*3/uL — ABNORMAL HIGH (ref 1.7–7.7)
Neutrophils Relative %: 60 %
Platelets: 160 10*3/uL (ref 150–400)
RBC: 3.19 MIL/uL — ABNORMAL LOW (ref 3.87–5.11)
RDW: 16.7 % — ABNORMAL HIGH (ref 11.5–15.5)
WBC: 13.7 10*3/uL — ABNORMAL HIGH (ref 4.0–10.5)
nRBC: 0 % (ref 0.0–0.2)

## 2022-01-25 LAB — GLUCOSE, CAPILLARY
Glucose-Capillary: 169 mg/dL — ABNORMAL HIGH (ref 70–99)
Glucose-Capillary: 153 mg/dL — ABNORMAL HIGH (ref 70–99)
Glucose-Capillary: 140 mg/dL — ABNORMAL HIGH (ref 70–99)
Glucose-Capillary: 167 mg/dL — ABNORMAL HIGH (ref 70–99)
Glucose-Capillary: 159 mg/dL — ABNORMAL HIGH (ref 70–99)
Glucose-Capillary: 162 mg/dL — ABNORMAL HIGH (ref 70–99)

## 2022-01-25 LAB — RENAL FUNCTION PANEL
Albumin: 2.4 g/dL — ABNORMAL LOW (ref 3.5–5.0)
Anion gap: 14 (ref 5–15)
BUN: 44 mg/dL — ABNORMAL HIGH (ref 6–20)
CO2: 23 mmol/L (ref 22–32)
Calcium: 8.7 mg/dL — ABNORMAL LOW (ref 8.9–10.3)
Chloride: 95 mmol/L — ABNORMAL LOW (ref 98–111)
Creatinine, Ser: 3.25 mg/dL — ABNORMAL HIGH (ref 0.44–1.00)
GFR, Estimated: 16 mL/min — ABNORMAL LOW (ref >60)
Glucose, Bld: 160 mg/dL — ABNORMAL HIGH (ref 70–99)
Phosphorus: 3.5 mg/dL (ref 2.5–4.6)
Potassium: 3.7 mmol/L (ref 3.5–5.1)
Sodium: 132 mmol/L — ABNORMAL LOW (ref 135–145)

## 2022-01-25 LAB — PROCALCITONIN: Procalcitonin: 0.52 ng/mL

## 2022-01-25 LAB — C-REACTIVE PROTEIN: CRP: 16 mg/dL — ABNORMAL HIGH (ref <1.0)

## 2022-01-25 LAB — BRAIN NATRIURETIC PEPTIDE: B Natriuretic Peptide: 4121.6 pg/mL — ABNORMAL HIGH (ref 0.0–100.0)

## 2022-01-25 MED ORDER — LORAZEPAM 2 MG/ML IJ SOLN
0.5000 mg | Freq: Once | INTRAMUSCULAR | Status: AC
  Administered 2022-01-25: 0.5 mg via INTRAVENOUS
  Filled 2022-01-25: qty 1

## 2022-01-25 MED ORDER — OXYMETAZOLINE HCL 0.05 % NA SOLN
1.0000 | NASAL | Status: DC | PRN
  Administered 2022-01-25: 3 via NASAL
  Administered 2022-01-28 – 2022-01-30 (×3): 1 via NASAL
  Administered 2022-02-03: 2 via NASAL
  Filled 2022-01-25: qty 30

## 2022-01-25 MED ORDER — METOPROLOL TARTRATE 25 MG PO TABS
25.0000 mg | ORAL_TABLET | Freq: Two times a day (BID) | ORAL | Status: DC
  Administered 2022-01-25 – 2022-01-27 (×3): 25 mg via ORAL
  Filled 2022-01-25 (×4): qty 1

## 2022-01-25 MED ORDER — HYDROMORPHONE HCL 1 MG/ML IJ SOLN
1.0000 mg | Freq: Three times a day (TID) | INTRAMUSCULAR | Status: AC | PRN
  Administered 2022-01-25 – 2022-01-26 (×2): 1 mg via INTRAVENOUS
  Filled 2022-01-25 (×2): qty 1

## 2022-01-25 MED ORDER — OXYCODONE HCL 5 MG PO TABS
5.0000 mg | ORAL_TABLET | Freq: Four times a day (QID) | ORAL | Status: DC | PRN
  Administered 2022-01-26 – 2022-02-03 (×5): 5 mg via ORAL
  Filled 2022-01-25 (×8): qty 1

## 2022-01-25 MED ORDER — HYDROMORPHONE HCL 1 MG/ML IJ SOLN
0.5000 mg | Freq: Once | INTRAMUSCULAR | Status: AC
  Administered 2022-01-25: 0.5 mg via INTRAVENOUS
  Filled 2022-01-25: qty 0.5

## 2022-01-25 NOTE — Plan of Care (Signed)
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
## 2022-01-25 NOTE — Progress Notes (Signed)
Received verbal order for inj ativan 0.'5mg'$  and inj diluadid 0.'5mg'$  from [facility].Cosign required

## 2022-01-25 NOTE — Progress Notes (Signed)
Pt's Heart rate was in the range of 115.Informed MD.Got a verbal order for Lopressor '25mg'$  PO,BID.Cosign required

## 2022-01-25 NOTE — Progress Notes (Signed)
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  Previously palliative care involved and she did ulimately start HD last admission - per charting she just had hoped she would never need it and then they hoped had she enough renal function to come off.  First HD on 10/28 after tunn catheter with IR   - HD on 10/30 HD #2, HD #3 11/1 - MWF schedule thereafter -  CLIP needed- in process - needs perm access- ordered vein mapping and VVS consult, she did not engage in access discussion.  Can follow up as OP. - she has very poor insight into her condition--> I am hoping her brother, who is on dialysis in 46 and who has been present for conversations related to her ESRD, can be a resource for her.  I am concerned that she will be poorly adherent to her dialysis and then will have a bad outcome - d/c foley     # Hyperkalemia - resolved   # Confusion/encephalopathy - Concerning for uremia in the setting of CKD progression - now on HD - improved   # Hypotension - Hx of HTN - avoid hypotension - would hold imdur and coreg - Continue midodrine 5 mg TID (new med this admit)   # Chronic diastolic CHF  - holding imdur and coreg   - optimize volume status with HD    # Anemia CKD  - iron deficiency - repleted earlier this mont per charting - Will start aranesp 40 mcg weekly on Sundays for now  #Fever: - no fevers in the last 24 hrs - new HD cath - d/c'd Foley    Disposition - CLIP in process  Subjective:    Sitting in bed, NAD.  Ordering lunch.     Objective:   BP (!) 131/52 (BP Location: Left Arm) Comment: correct value entered  Pulse (!) 105   Temp 98.6 F (37 C) (Oral)   Resp 15   Ht '5\' 7"'$  (1.702 m)   Wt (!) 144.9 kg Comment: bed scale  SpO2 100%   BMI 50.03 kg/m   Physical Exam: Gen: lying in bed CVS: RRR Resp: clear Abd: soft Ext: 1+ LE edema ACCESS: R IJ  [facility]  Labs: BMET Recent Labs  Lab 01/19/22 0308 01/21/22 0414 01/22/22 0326 01/23/22 0244 01/24/22 0351 01/25/22 0629  NA 137 137 135 136 133* 132*  K 4.6 3.4* 3.5 3.3* 3.9 3.7  CL 99 100 99 99 99 95*  CO2 '23 26 25 26 25 23  '$ GLUCOSE 115* 129* 111* 127* 169* 160*  BUN 115* 75* 43* 46* 37* 44*  CREATININE 8.14* 5.64* 3.51* 3.35* 2.80* 3.25*  CALCIUM 8.2* 8.7* 8.5* 8.5* 8.6* 8.7*  PHOS 6.6* 4.5 3.2 2.9 3.0 3.5   CBC Recent Labs  Lab 01/21/22 0414 01/22/22 0326 01/24/22 0614 01/25/22 0629  WBC 10.1 11.2* 13.5* 13.7*  NEUTROABS  --   --   --  8.2*  HGB 8.4* 9.4* 9.3* 8.7*  HCT 26.9* 30.6* 30.8* 27.5*  MCV 85.1 86.4 88.0 86.2  PLT 176 167 166 160      Medications:     Chlorhexidine Gluconate Cloth  6 each Topical Q0600   cyclobenzaprine  5 mg Oral TID   darbepoetin (ARANESP) injection - NON-DIALYSIS  40 mcg Subcutaneous Q Sun-1800   heparin  5,000 Units Subcutaneous Q8H   hydrocerin   Topical Daily  insulin aspart  0-9 Units Subcutaneous Q4H   midodrine  5 mg Oral TID WC   traMADol  50 mg Oral Once     [name] MD 01/25/2022, 10:34 AM

## 2022-01-25 NOTE — Progress Notes (Signed)
PROGRESS NOTE  [name]  DOB: November 22, 1965  PCP: Jacklynn Ganong, MD HKV:[id number]  DOA: 01/16/2022  LOS: 9 days  Hospital Day: 10  Brief narrative: [name] is a 56 y.o. female with PMH significant for DM2, HTN, HLD, morbid obesity, OSA, CKD 5, CAD/CABG, chronic diastolic CHF.  Patient was recently hospitalized 9/27 to 10/17 for right foot diabetic ulcer for which podiatry and vascular surgery were consulted. Treated with antibiotics.  Hospitalization was complicated by development of generalized rash likely due to hypotension antibiotics.  Renal function worsened as well and hence patient needed dialysis.  Her renal function improved and she was discharged off dialysis with a creatinine of 2.5 on 10/17. 10/27, patient presented to OSH with altered mental status, lethargy, weakness.  Creatinine was elevated to 7.7, potassium elevated to 6.  Chest x-ray showed pulmonary edema.  Patient was given IV calcium gluconate, IV Lasix, insulin, dextrose and transferred to [facility]. Admitted to hospitalist service. Nephrology was consulted.  Patient was reinitiated on dialysis.   Subjective:   Lying in bed in no discomfort whatsoever denies any headache or chest pain, chronic bilateral foot pain right more than left, prefers to get IV Dilaudid according to the patient.  Could not tell me the reason.   Assessment and plan:  AKI on CKD 5, New ESRD - Presented with significantly elevated creatinine to 7.7 and potassium to 6.  Nephrology consulted. 10/28/2, tunnel catheter placed by IR.  Last dialysis this morning. Needs vein mapping for permanent access.  Vascular surgery to see. Patient has poor insight to her issues -high risk of noncompliance.  Acute on chronic heart failure with preserved ejection fraction (HFpEF), Hypotension , History of hypertension - Had volume overload at the time of admission.  Fluid removal through HD.  Midodrine for blood pressure support.  Chronic heart  medications which are Coreg, Demadex and Imdur on hold due to hypotension.    Fever early morning 01/24/2022.  Chest x-ray stable, had a indwelling Foley which was removed, blood cultures drawn.  Appears nontoxic.  Monitor closely.    Encephalopathy - Patient has uremia and OSA causing encephalopathy. Mentation gradually improving.  Anemia of chronic disease - Hemoglobin stable around 9.  No acute issue.   Morbid obesity - -Body mass index is 50.03 kg/m. Patient has been advised to make an attempt to improve diet and exercise patterns to aid in weight loss.  OSA (obstructive sleep apnea)  - Continue CPAP nightly.   Right foot diabetic ulcer, 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 - Continue local wound care. Per wound care instructions, daily cleanse, pat dry and application of moisturizer (Eucerin cream) RLE wound will be covered with a folded layer of xeroform gauze topped with dry gauze. Both LEs are to be wrapped with Kerlix roll gauze and secured with paper tape. Feet are to be placed into Prevalon boots. A sacral silicone foam is to be placed for PI prevention.  On oral and IV pain medications, question some narcotic seeking behavior now, monitor cautiously.  During her last admission she was seen by Dr. Stanford Breed vascular surgeon and Dr. Loel Lofty podiatrist, will consult podiatry to see the patient here as she has intermittently been running low-grade fevers and now complaining of more foot pain.  Mostly in right foot.   Rash with antibiotics - skin flaking off. Moisturizers   ? Foley catheter - Inserted at outside hospital prior to admission on 01/16/22 . Discussed with nephrology.  Foley removal ordered.   Generalized body ache - Most likely in the setting of uremia. Improved with supportive Rx.   Type 2 diabetes mellitus with diabetic peripheral neuropathy- A1c 6.9 on 10/25/2021, PTA on Basaglar.  Currently on hold. Currently blood sugars controlled  on sliding scale insulin Accu-Cheks. Gabapentin for neuropathy, currently on hold.  CBG (last 3)  Recent Labs    01/24/22 2330 01/25/22 0440 01/25/22 0836  GLUCAP 163* 169* 153*      Goals of care   Code Status: Full Code    Mobility: Encourage ambulation  Skin assessment:  Pressure Injury 12/28/21 Buttocks Right Stage 2 -  Partial thickness loss of dermis presenting as a shallow open injury with a red, pink wound bed without slough. (Active)  12/28/21 2300  Location: Buttocks  Location Orientation: Right  Staging: Stage 2 -  Partial thickness loss of dermis presenting as a shallow open injury with a red, pink wound bed without slough.  Wound Description (Comments):   Present on Admission: -- (I'm not sure)     Pressure Injury 12/28/21 Left Stage 2 -  Partial thickness loss of dermis presenting as a shallow open injury with a red, pink wound bed without slough. (Active)  12/28/21 2300  Location:   Location Orientation: Left  Staging: Stage 2 -  Partial thickness loss of dermis presenting as a shallow open injury with a red, pink wound bed without slough.  Wound Description (Comments):   Present on Admission: -- (I'm not sure)    Nutritional status:  Body mass index is 50.03 kg/m.       Active Pressure Injury/Wound(s)     Pressure Ulcer  Duration          Pressure Injury 12/28/21 Buttocks Right Stage 2 -  Partial thickness loss of dermis presenting as a shallow open injury with a red, pink wound bed without slough. 26 days   Pressure Injury 12/28/21 Left Stage 2 -  Partial thickness loss of dermis presenting as a shallow open injury with a red, pink wound bed without slough. 26 days             Diet:  Diet Order             Diet regular Room service appropriate? Yes; Fluid consistency: Thin  Diet effective now                   DVT prophylaxis:  heparin injection 5,000 Units Start: 01/17/22 0600    Fluid: None Consultants: Nephrology Family  Communication: None at bedside  Status is: Inpatient  Continue in-hospital care because: Outpatient dialysis chair arrangement Level of care: Progressive   Dispo: The patient is from: Home              Anticipated d/c is to: SNF recommended by PT              Patient currently is not medically stable to d/c.   Difficult to place patient No     Infusions:    Scheduled Meds:  Chlorhexidine Gluconate Cloth  6 each Topical Q0600   cyclobenzaprine  5 mg Oral TID   darbepoetin (ARANESP) injection - NON-DIALYSIS  40 mcg Subcutaneous Q Sun-1800   heparin  5,000 Units Subcutaneous Q8H   hydrocerin   Topical Daily   insulin aspart  0-9 Units Subcutaneous Q4H   midodrine  5 mg Oral TID WC   traMADol  50 mg Oral Once    PRN meds:  acetaminophen **OR** acetaminophen, HYDROmorphone (DILAUDID) injection, LORazepam, ondansetron **OR** ondansetron (ZOFRAN) IV, mouth rinse, oxyCODONE, polyethylene glycol   Objective: Vitals:   01/25/22 0754 01/25/22 0837  BP: (!) 131/52   Pulse:    Resp: 11 15  Temp: 98.6 F (37 C)   SpO2: 99% 100%    Intake/Output Summary (Last 24 hours) at 01/25/2022 0848 Last data filed at 01/25/2022 0600 Gross per 24 hour  Intake 240 ml  Output 100 ml  Net 140 ml   Filed Weights   01/21/22 1238 01/23/22 0811 01/23/22 1228  Weight: (!) 142.7 kg (!) 146.8 kg (!) 144.9 kg   Weight change:  Body mass index is 50.03 kg/m.   Physical Exam:  Awake Alert, Oriented X 3, No new F.N deficits, Normal affect [place].AT,PERRAL Supple Neck,No JVD, No cervical lymphadenopathy appriciated.  Symmetrical Chest wall movement, Good air movement bilaterally, CTAB RRR,No Gallops, Rubs or new Murmurs, No Parasternal Heave +ve B.Sounds, Abd Soft, No tenderness, No organomegaly appriciated, No rebound - guarding or rigidity. 2+ lower extremity edema, bilateral Unna boots, chronic lichenification in her skin mostly in the lower extremities, right IJ HD catheter in place site  clean.   Data Review: I have personally reviewed the laboratory data and studies available.  Recent Labs  Lab 01/21/22 0414 01/22/22 0326 01/24/22 0614 01/25/22 0629  WBC 10.1 11.2* 13.5* 13.7*  HGB 8.4* 9.4* 9.3* 8.7*  HCT 26.9* 30.6* 30.8* 27.5*  PLT 176 167 166 160  MCV 85.1 86.4 88.0 86.2  MCH 26.6 26.6 26.6 27.3  MCHC 31.2 30.7 30.2 31.6  RDW 17.2* 17.0* 16.7* 16.7*  LYMPHSABS  --   --   --  4.0  MONOABS  --   --   --  1.2*  EOSABS  --   --   --  0.1  BASOSABS  --   --   --  0.1    Recent Labs  Lab 01/21/22 0414 01/22/22 0326 01/23/22 0244 01/24/22 0351 01/24/22 0614 01/25/22 0629  NA 137 135 136 133*  --  132*  K 3.4* 3.5 3.3* 3.9  --  3.7  CL 100 99 99 99  --  95*  CO2 '26 25 26 25  '$ --  23  GLUCOSE 129* 111* 127* 169*  --  160*  BUN 75* 43* 46* 37*  --  44*  CREATININE 5.64* 3.51* 3.35* 2.80*  --  3.25*  CALCIUM 8.7* 8.5* 8.5* 8.6*  --  8.7*  ALBUMIN 2.0* 2.2* 2.2* 2.5*  --  2.4*  PHOS 4.5 3.2 2.9 3.0  --  3.5  CRP  --   --   --   --  9.8* 16.0*  PROCALCITON  --   --   --   --  0.32 0.52  BNP  --   --   --   --  >4,500.0* 4,121.6*        Signature  Lala Lund M.D on 01/25/2022 at 8:48 AM   -  To page go to [email or web address]

## 2022-01-25 NOTE — TOC Progression Note (Signed)
Transition of Care [facility]) - Progression Note    Patient Details  Name: [name] MRN: [id number] Date of Birth: 10-15-1965  Transition of Care [facility]) CM/SW [place], [place] Phone Number: 01/25/2022, 11:47 AM  Clinical Narrative:     CSW spoke with patient regarding her dc plan. Patient reports she is still undecided on if she wants to go to a SNF when medically ready for dc. CSW informed patient that currently there are no SNF bed offers. CSW asked patient if she wanted CSW to fax out further in case she does decide that she wants to go to a short term rehab when medically ready for dc. Patient reports she is about to eat lunch and request for CSW to follow back up. CSW to follow back up with patient on dc plan. CSW will continue to follow and assist with patients dc planning needs.   Expected Discharge Plan: Skilled Nursing Facility Barriers to Discharge: Continued Medical Work up, Waiting for outpatient dialysis  Expected Discharge Plan and Services Expected Discharge Plan: [place] arrangements for the past 2 months: Single Family Home                                       Social Determinants of Health (SDOH) Interventions    Readmission Risk Interventions    12/18/2021    2:02 PM 11/05/2021    1:58 PM  Readmission Risk Prevention Plan  Transportation Screening Complete Complete  PCP or Specialist Appt within 5-7 Days  Complete  PCP or Specialist Appt within 3-5 Days Complete   Home Care Screening  Complete  Medication Review (RN CM)  Complete  HRI or Home Care Consult Complete   Social Work Consult for [place] Planning/Counseling Complete   Palliative Care Screening Not Applicable   Medication Review [employer]) Complete

## 2022-01-25 NOTE — Progress Notes (Signed)
Pt was having nose bleed nasal spray Afrin administered.The bleeding is controlled now but the HR is continuously in higher range informed MD and got new orders for inj ativan 0.'5mg'$  and inj diluadid 0.'5mg'$ 

## 2022-01-26 ENCOUNTER — Inpatient Hospital Stay (HOSPITAL_COMMUNITY): Payer: Medicare Other

## 2022-01-26 DIAGNOSIS — N186 End stage renal disease: Secondary | ICD-10-CM

## 2022-01-26 DIAGNOSIS — Z992 Dependence on renal dialysis: Secondary | ICD-10-CM | POA: Diagnosis not present

## 2022-01-26 DIAGNOSIS — E11621 Type 2 diabetes mellitus with foot ulcer: Secondary | ICD-10-CM | POA: Diagnosis not present

## 2022-01-26 DIAGNOSIS — L97509 Non-pressure chronic ulcer of other part of unspecified foot with unspecified severity: Secondary | ICD-10-CM | POA: Diagnosis not present

## 2022-01-26 LAB — CBC WITH DIFFERENTIAL/PLATELET
Abs Immature Granulocytes: 0.09 10*3/uL — ABNORMAL HIGH (ref 0.00–0.07)
Basophils Absolute: 0.1 10*3/uL (ref 0.0–0.1)
Basophils Relative: 1 %
Eosinophils Absolute: 0.1 10*3/uL (ref 0.0–0.5)
Eosinophils Relative: 1 %
HCT: 25.7 % — ABNORMAL LOW (ref 36.0–46.0)
Hemoglobin: 8.2 g/dL — ABNORMAL LOW (ref 12.0–15.0)
Immature Granulocytes: 1 %
Lymphocytes Relative: 29 %
Lymphs Abs: 3.8 10*3/uL (ref 0.7–4.0)
MCH: 27.4 pg (ref 26.0–34.0)
MCHC: 31.9 g/dL (ref 30.0–36.0)
MCV: 86 fL (ref 80.0–100.0)
Monocytes Absolute: 1.1 10*3/uL — ABNORMAL HIGH (ref 0.1–1.0)
Monocytes Relative: 9 %
Neutro Abs: 8 10*3/uL — ABNORMAL HIGH (ref 1.7–7.7)
Neutrophils Relative %: 59 %
Platelets: 171 10*3/uL (ref 150–400)
RBC: 2.99 MIL/uL — ABNORMAL LOW (ref 3.87–5.11)
RDW: 16.7 % — ABNORMAL HIGH (ref 11.5–15.5)
WBC: 13.2 10*3/uL — ABNORMAL HIGH (ref 4.0–10.5)
nRBC: 0.4 % — ABNORMAL HIGH (ref 0.0–0.2)

## 2022-01-26 LAB — RENAL FUNCTION PANEL
Albumin: 2.3 g/dL — ABNORMAL LOW (ref 3.5–5.0)
Anion gap: 14 (ref 5–15)
BUN: 52 mg/dL — ABNORMAL HIGH (ref 6–20)
CO2: 25 mmol/L (ref 22–32)
Calcium: 8.9 mg/dL (ref 8.9–10.3)
Chloride: 95 mmol/L — ABNORMAL LOW (ref 98–111)
Creatinine, Ser: 3.66 mg/dL — ABNORMAL HIGH (ref 0.44–1.00)
GFR, Estimated: 14 mL/min — ABNORMAL LOW (ref >60)
Glucose, Bld: 121 mg/dL — ABNORMAL HIGH (ref 70–99)
Phosphorus: 3.9 mg/dL (ref 2.5–4.6)
Potassium: 3.9 mmol/L (ref 3.5–5.1)
Sodium: 134 mmol/L — ABNORMAL LOW (ref 135–145)

## 2022-01-26 LAB — GLUCOSE, CAPILLARY
Glucose-Capillary: 133 mg/dL — ABNORMAL HIGH (ref 70–99)
Glucose-Capillary: 155 mg/dL — ABNORMAL HIGH (ref 70–99)
Glucose-Capillary: 163 mg/dL — ABNORMAL HIGH (ref 70–99)
Glucose-Capillary: 134 mg/dL — ABNORMAL HIGH (ref 70–99)

## 2022-01-26 LAB — C-REACTIVE PROTEIN: CRP: 15.6 mg/dL — ABNORMAL HIGH (ref <1.0)

## 2022-01-26 LAB — PROCALCITONIN: Procalcitonin: 0.82 ng/mL

## 2022-01-26 LAB — BRAIN NATRIURETIC PEPTIDE: B Natriuretic Peptide: >4500 pg/mL — ABNORMAL HIGH (ref 0.0–100.0)

## 2022-01-26 MED ORDER — HEPARIN SODIUM (PORCINE) 1000 UNIT/ML IJ SOLN
INTRAMUSCULAR | Status: AC
  Administered 2022-01-26: 1000 [IU]
  Filled 2022-01-26: qty 4

## 2022-01-26 MED ORDER — HYDROMORPHONE HCL 1 MG/ML IJ SOLN
0.5000 mg | Freq: Three times a day (TID) | INTRAMUSCULAR | Status: DC | PRN
  Administered 2022-01-26 – 2022-01-27 (×2): 0.5 mg via INTRAVENOUS
  Filled 2022-01-26 (×3): qty 0.5

## 2022-01-26 MED ORDER — SALINE SPRAY 0.65 % NA SOLN
2.0000 | Freq: Three times a day (TID) | NASAL | Status: DC
  Administered 2022-01-26 – 2022-02-03 (×15): 2 via NASAL
  Filled 2022-01-26 (×3): qty 44

## 2022-01-26 MED ORDER — ALBUMIN HUMAN 25 % IV SOLN
INTRAVENOUS | Status: AC
  Administered 2022-01-26: 12.5 g
  Filled 2022-01-26: qty 100

## 2022-01-26 NOTE — Progress Notes (Addendum)
   PODIATRY PROGRESS NOTE  NAME [name] MRN [id number] DOB 07-14-1965 DOA 01/16/2022   Reason for consult: No chief complaint on file.    History of present illness: 56 y.o. female with bilateral lower extremity ulcerations admitted for worsening renal issues requiring dialysis.  Podiatrist consulted for wounds as she has had mild increased temperature over the weekend as well as increased white blood cell count.  Patient was in dialysis today.  She states that she has not had any fevers or chills.  Occasional pain to the foot and legs but she states that is because that she did not get her pain medicine on time.  Vitals:   01/26/22 1830 01/26/22 1900  BP: 128/75 137/67  Pulse: 89 90  Resp: 18 18  Temp:    SpO2: 97%        Latest Ref Rng & Units 01/26/2022    4:21 AM 01/25/2022    6:29 AM 01/24/2022    6:14 AM  CBC  WBC 4.0 - 10.5 K/uL 13.2  13.7  13.5   Hemoglobin 12.0 - 15.0 g/dL 8.2  8.7  9.3   Hematocrit 36.0 - 46.0 % 25.7  27.5  30.8   Platelets 150 - 400 K/uL 171  160  166        Latest Ref Rng & Units 01/26/2022    4:21 AM 01/25/2022    6:29 AM 01/24/2022    3:51 AM  BMP  Glucose 70 - 99 mg/dL 121  160  169   BUN 6 - 20 mg/dL 52  44  37   Creatinine 0.44 - 1.00 mg/dL 3.66  3.25  2.80   Sodium 135 - 145 mmol/L 134  132  133   Potassium 3.5 - 5.1 mmol/L 3.9  3.7  3.9   Chloride 98 - 111 mmol/L 95  95  99   CO2 22 - 32 mmol/L '25  23  25   '$ Calcium 8.9 - 10.3 mg/dL 8.9  8.7  8.6       Physical Exam: General: AAOx3 NAD   Dermatology: Dressing intact  Vascular: Dressings intact.    Neurological: Dressings intact  Musculoskeletal Exam: Motor function intact to the digits    ASSESSMENT/PLAN OF CARE  Bilateral ulcerations  Reviewed x-ray of the right foot.  No definitive evidence of osteomyelitis.  Her white blood cell count is stabilized.  Her Tmax is 99.3 over the last 24 hours.  I did not change the dressing as she was in dialysis.  We will plan on  seeing her tomorrow.  If there is further clinical concern of the foot because infection can repeat the MRI.  Dr. Sherryle Lis to see the patient tomorrow.    Please contact me directly with any questions or concerns.     Celesta Gentile, DPM [facility]  Dr[address] Bonna Gains[address], [address] [place], [place] [address]                Office [phone]  Fax [phone]

## 2022-01-26 NOTE — Progress Notes (Signed)
[facility]  Subjective  - refusing UE access   Objective 121/70 91 98.3 F (36.8 C) (Oral) 20 100%  Intake/Output Summary (Last 24 hours) at 01/26/2022 0826 Last data filed at 01/26/2022 0500 Gross per 24 hour  Intake --  Output 450 ml  Net -450 ml    moving B UE, palpable radial pulses, strength intact Right IJ TDC in place  Assessment/Planning: ESRD with right IJ [facility] Patient refuses UE access  Ordered vein mapping again she states she may allow this and f/u as OP for access discussion. I will send a message to our office to schedule f/u in 2-3 weeks to give her time to process.  Roxy Horseman 01/26/2022 8:26 AM --  Laboratory Lab Results: Recent Labs    01/25/22 0629 01/26/22 0421  WBC 13.7* 13.2*  HGB 8.7* 8.2*  HCT 27.5* 25.7*  PLT 160 171   BMET Recent Labs    01/25/22 0629 01/26/22 0421  NA 132* 134*  K 3.7 3.9  CL 95* 95*  CO2 23 25  GLUCOSE 160* 121*  BUN 44* 52*  CREATININE 3.25* 3.66*  CALCIUM 8.7* 8.9    COAG Lab Results  Component Value Date   INR 1.6 (H) 01/17/2022   INR 1.6 (H) 12/28/2021   INR 1.9 (H) 12/23/2021   No results found for: "PTT"

## 2022-01-26 NOTE — Progress Notes (Signed)
[facility] Progress Note     Assessment/ Plan:   # New ESRD - AKI on CKD stage IV/V now with progression to ESRD.  Previously palliative care involved and she did ulimately start HD last admission - per charting she just had hoped she would never need it and then they hoped had she enough renal function to come off.  First HD on 10/28 after tunn catheter with IR   - HD on 10/30 HD #2, HD #3 11/1 - MWF schedule thereafter -  CLIP needed- in process; she lives in [place] - needs perm access- ordered vein mapping and VVS consult, spent time educating her again but she does not have a great understanding. I really believe d/w her brother who is on dialysis would help. She was not able to reach him yest. Appreciate VVS agreeing to see the pt.  - she has very poor insight into her condition--> I am hoping her brother, who is on dialysis in 60 and who has been present for conversations related to her ESRD, can be a resource for her.  I am concerned that she will be poorly adherent to her dialysis and then will have a bad outcome. Very poor understanding of ESRD; she understands she will have to go TIW and was concerned with the impact on her life.     # Hyperkalemia - resolved   # Confusion/encephalopathy - Concerning for uremia in the setting of CKD progression - now on HD - improved   # Hypotension - Hx of HTN - avoid hypotension - would hold imdur and coreg - Continue midodrine 5 mg TID (new med this admit)   # Chronic diastolic CHF  - holding imdur and coreg   - optimize volume status with HD    # Anemia CKD  - iron deficiency - repleted earlier in the month per charting - Will start aranesp 40 mcg weekly on Sundays for now (last given 11/5)   #Fever: - no fevers in the last 24 hrs - new HD cath - d/c'd Foley     Disposition - CLIP in process  Subjective:   Sitting in bed, NAD. Cooperative but concerned about the impact of HD on her life.  Denies  f/c/n/v/sob.      Objective:   BP (!) 121/56 (BP Location: Left Arm)   Pulse 86   Temp 97.8 F (36.6 C) (Oral)   Resp 14   Ht '5\' 7"'$  (1.702 m)   Wt (!) 144.9 kg Comment: bed scale  SpO2 100%   BMI 50.03 kg/m   Intake/Output Summary (Last 24 hours) at 01/26/2022 6314 Last data filed at 01/26/2022 0500 Gross per 24 hour  Intake --  Output 450 ml  Net -450 ml   Weight change:   Physical Exam: Gen: lying in bed, pleasant CVS: RRR Resp: clear Abd: soft Ext: 1+ LE edema ACCESS: R IJ [facility]  Imaging: DG Foot Complete Right  Result Date: 01/25/2022 CLINICAL DATA:  Ulcer of right foot limited to breakdown of skin. EXAM: RIGHT FOOT COMPLETE - 3+ VIEW COMPARISON:  Radiograph 12/17/2021, foot MRI 12/20/2021. 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 There is section margins are similar to prior exam without frank erosive change. There is some heterotopic calcification developing. No erosive or bony destructive change. There is no tracking soft tissue gas or radiopaque foreign body. IMPRESSION: 1. Postsurgical change of the first toe and distal metatarsal. No radiographic findings of osteomyelitis. 2. 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 Electronically Signed   By: Keith Rake M.D.   On: 01/25/2022 17:14   DG Chest Port 1 View  Result Date: 01/24/2022 CLINICAL DATA:  Shortness of breath EXAM: PORTABLE CHEST 1 VIEW COMPARISON:  Two days ago FINDINGS: Cardiomegaly. CABG and venous graft stenting. Dialysis catheter on the right. Vascular congestion without Kerley lines, effusion, or pneumothorax. No focal pneumonia IMPRESSION: Cardiomegaly and vascular congestion. Electronically Signed   By: Jorje Guild M.D.   On: 01/24/2022 08:33    Labs: BMET Recent Labs  Lab 01/21/22 0414 01/22/22 0326 01/23/22 0244 01/24/22 0351 01/25/22 0629 01/26/22 0421  NA 137 135 136 133* 132* 134*  K 3.4* 3.5 3.3* 3.9 3.7 3.9  CL 100 99 99 99 95* 95*   CO2 '26 25 26 25 23 25  '$ GLUCOSE 129* 111* 127* 169* 160* 121*  BUN 75* 43* 46* 37* 44* 52*  CREATININE 5.64* 3.51* 3.35* 2.80* 3.25* 3.66*  CALCIUM 8.7* 8.5* 8.5* 8.6* 8.7* 8.9  PHOS 4.5 3.2 2.9 3.0 3.5 3.9   CBC Recent Labs  Lab 01/22/22 0326 01/24/22 0614 01/25/22 0629 01/26/22 0421  WBC 11.2* 13.5* 13.7* 13.2*  NEUTROABS  --   --  8.2* 8.0*  HGB 9.4* 9.3* 8.7* 8.2*  HCT 30.6* 30.8* 27.5* 25.7*  MCV 86.4 88.0 86.2 86.0  PLT 167 166 160 171    Medications:     Chlorhexidine Gluconate Cloth  6 each Topical Q0600   cyclobenzaprine  5 mg Oral TID   darbepoetin (ARANESP) injection - NON-DIALYSIS  40 mcg Subcutaneous Q Sun-1800   heparin  5,000 Units Subcutaneous Q8H   hydrocerin   Topical Daily   insulin aspart  0-9 Units Subcutaneous Q4H   metoprolol tartrate  25 mg Oral BID   midodrine  5 mg Oral TID WC   sodium chloride  2 spray Each Nare TID AC & HS   traMADol  50 mg Oral Once      Otelia Santee, MD 01/26/2022, 7:33 AM

## 2022-01-26 NOTE — Progress Notes (Addendum)
PROGRESS NOTE  [name]  DOB: March 26, 1965  PCP: Jacklynn Ganong, MD JJK:[id number]  DOA: 01/16/2022  LOS: 10 days  Hospital Day: 11  Brief narrative: [name] is a 56 y.o. female with PMH significant for DM2, HTN, HLD, morbid obesity, OSA, CKD 5, CAD/CABG, chronic diastolic CHF.  Patient was recently hospitalized 9/27 to 10/17 for right foot diabetic ulcer for which podiatry and vascular surgery were consulted. Treated with antibiotics.  Hospitalization was complicated by development of generalized rash likely due to hypotension antibiotics.  Renal function worsened as well and hence patient needed dialysis.  Her renal function improved and she was discharged off dialysis with a creatinine of 2.5 on 10/17. 10/27, patient presented to OSH with altered mental status, lethargy, weakness.  Creatinine was elevated to 7.7, potassium elevated to 6.  Chest x-ray showed pulmonary edema.  Patient was given IV calcium gluconate, IV Lasix, insulin, dextrose and transferred to [facility]. Admitted to hospitalist service. Nephrology was consulted.  Patient was reinitiated on dialysis.   Subjective:   Patient in bed, appears comfortable, denies any headache, no fever, no chest pain or pressure, no shortness of breath , no abdominal pain. No new focal weakness.   Assessment and plan:  AKI on CKD 5, New ESRD - Presented with significantly elevated creatinine to 7.7 and potassium to 6.  Nephrology consulted. 10/28/2, tunnel catheter placed by IR.  Last dialysis this morning. Needs vein mapping for permanent access.  Vascular surgery to see. Patient has poor insight to her issues -high risk of noncompliance.  Acute on chronic heart failure with preserved ejection fraction (HFpEF), Hypotension , History of hypertension - Had volume overload at the time of admission.  Fluid removal through HD.  Midodrine for blood pressure support.  Chronic heart medications which are Coreg, Demadex and Imdur on hold due  to hypotension.    Fever early morning 01/24/2022.  Chest x-ray stable, had a indwelling Foley which was removed, blood cultures drawn, remain negative.  Appears nontoxic.  Monitor closely.  Fevers have completely resolved.  Encephalopathy - Patient has uremia and OSA causing encephalopathy. Mentation gradually improving.  Anemia of chronic disease - Hemoglobin stable around 9.  No acute issue.   Morbid obesity - -Body mass index is 50.03 kg/m. Patient has been advised to make an attempt to improve diet and exercise patterns to aid in weight loss.  OSA (obstructive sleep apnea)  - Continue CPAP nightly.   Right foot diabetic ulcer, 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 - Continue local wound care. Per wound care instructions, daily cleanse, pat dry and application of moisturizer (Eucerin cream) RLE wound will be covered with a folded layer of xeroform gauze topped with dry gauze. Both LEs are to be wrapped with Kerlix roll gauze and secured with paper tape. Feet are to be placed into Prevalon boots. A sacral silicone foam is to be placed for PI prevention.  On oral and IV pain medications, question some narcotic seeking behavior now, monitor cautiously.  During her last admission she was seen by Dr. Stanford Breed vascular surgeon and Dr. Loel Lofty podiatrist, she was again seen by podiatry this admission.  Continue supportive care.   Rash with antibiotics - skin flaking off. Moisturizers   Foley catheter placement upon admission- Inserted at outside hospital prior to admission on 01/16/22 . Discussed with nephrology.  Foley has been removed on 01/24/2022.   Generalized body ache - Most likely in the setting of uremia. Improved  with supportive Rx.   Type 2 diabetes mellitus with diabetic peripheral neuropathy- A1c 6.9 on 10/25/2021, PTA on Basaglar.  Currently on hold. Currently blood sugars controlled on sliding scale insulin Accu-Cheks. Gabapentin for neuropathy,  currently on hold.  CBG (last 3)  Recent Labs    01/25/22 2015 01/25/22 2326 01/26/22 0320  GLUCAP 159* 162* 133*      Goals of care   Code Status: Full Code    Mobility: Encourage ambulation  Skin assessment:  Pressure Injury 12/28/21 Buttocks Right Stage 2 -  Partial thickness loss of dermis presenting as a shallow open injury with a red, pink wound bed without slough. (Active)  12/28/21 2300  Location: Buttocks  Location Orientation: Right  Staging: Stage 2 -  Partial thickness loss of dermis presenting as a shallow open injury with a red, pink wound bed without slough.  Wound Description (Comments):   Present on Admission: -- (I'm not sure)     Pressure Injury 12/28/21 Left Stage 2 -  Partial thickness loss of dermis presenting as a shallow open injury with a red, pink wound bed without slough. (Active)  12/28/21 2300  Location:   Location Orientation: Left  Staging: Stage 2 -  Partial thickness loss of dermis presenting as a shallow open injury with a red, pink wound bed without slough.  Wound Description (Comments):   Present on Admission: -- (I'm not sure)    Nutritional status:  Body mass index is 50.03 kg/m.       Active Pressure Injury/Wound(s)     Pressure Ulcer  Duration          Pressure Injury 12/28/21 Buttocks Right Stage 2 -  Partial thickness loss of dermis presenting as a shallow open injury with a red, pink wound bed without slough. 26 days   Pressure Injury 12/28/21 Left Stage 2 -  Partial thickness loss of dermis presenting as a shallow open injury with a red, pink wound bed without slough. 26 days             Diet:  Diet Order             Diet regular Room service appropriate? Yes; Fluid consistency: Thin  Diet effective now                   DVT prophylaxis:  heparin injection 5,000 Units Start: 01/17/22 0600    Fluid: None Consultants: Nephrology Family Communication: None at bedside  Status is:  Inpatient  Continue in-hospital care because: Outpatient dialysis chair arrangement Level of care: Progressive   Dispo: The patient is from: Home              Anticipated d/c is to: SNF recommended by PT              Patient currently is not medically stable to d/c.   Difficult to place patient No  Scheduled Meds:  Chlorhexidine Gluconate Cloth  6 each Topical Q0600   cyclobenzaprine  5 mg Oral TID   darbepoetin (ARANESP) injection - NON-DIALYSIS  40 mcg Subcutaneous Q Sun-1800   heparin  5,000 Units Subcutaneous Q8H   hydrocerin   Topical Daily   insulin aspart  0-9 Units Subcutaneous Q4H   metoprolol tartrate  25 mg Oral BID   midodrine  5 mg Oral TID WC   sodium chloride  2 spray Each Nare TID AC & HS   traMADol  50 mg Oral Once    PRN meds: acetaminophen **OR**  acetaminophen, HYDROmorphone (DILAUDID) injection, LORazepam, ondansetron **OR** ondansetron (ZOFRAN) IV, mouth rinse, oxyCODONE, oxymetazoline, polyethylene glycol   Objective: Vitals:   01/26/22 0450 01/26/22 0800  BP: (!) 121/56 121/70  Pulse: 86 91  Resp: 14 20  Temp:  98.3 F (36.8 C)  SpO2: 100% 100%    Intake/Output Summary (Last 24 hours) at 01/26/2022 1040 Last data filed at 01/26/2022 0500 Gross per 24 hour  Intake --  Output 450 ml  Net -450 ml   Filed Weights   01/21/22 1238 01/23/22 0811 01/23/22 1228  Weight: (!) 142.7 kg (!) 146.8 kg (!) 144.9 kg   Weight change:  Body mass index is 50.03 kg/m.   Physical Exam:  Awake Alert, No new F.N deficits, Normal affect Ch[place].AT,PERRAL Supple Neck, No JVD,   Symmetrical Chest wall movement, Good air movement bilaterally, CTAB RRR,No Gallops, Rubs or new Murmurs,  +ve B.Sounds, Abd Soft, No tenderness,   2+ lower extremity edema, bilateral Unna boots, chronic lichenification in her skin mostly in the lower extremities, right IJ HD catheter in place site clean.   Data Review: I have personally reviewed the laboratory data and studies  available.  Recent Labs  Lab 01/21/22 0414 01/22/22 0326 01/24/22 0614 01/25/22 0629 01/26/22 0421  WBC 10.1 11.2* 13.5* 13.7* 13.2*  HGB 8.4* 9.4* 9.3* 8.7* 8.2*  HCT 26.9* 30.6* 30.8* 27.5* 25.7*  PLT 176 167 166 160 171  MCV 85.1 86.4 88.0 86.2 86.0  MCH 26.6 26.6 26.6 27.3 27.4  MCHC 31.2 30.7 30.2 31.6 31.9  RDW 17.2* 17.0* 16.7* 16.7* 16.7*  LYMPHSABS  --   --   --  4.0 3.8  MONOABS  --   --   --  1.2* 1.1*  EOSABS  --   --   --  0.1 0.1  BASOSABS  --   --   --  0.1 0.1    Recent Labs  Lab 01/22/22 0326 01/23/22 0244 01/24/22 0351 01/24/22 0614 01/25/22 0629 01/26/22 0421  NA 135 136 133*  --  132* 134*  K 3.5 3.3* 3.9  --  3.7 3.9  CL 99 99 99  --  95* 95*  CO2 '25 26 25  '$ --  23 25  GLUCOSE 111* 127* 169*  --  160* 121*  BUN 43* 46* 37*  --  44* 52*  CREATININE 3.51* 3.35* 2.80*  --  3.25* 3.66*  CALCIUM 8.5* 8.5* 8.6*  --  8.7* 8.9  ALBUMIN 2.2* 2.2* 2.5*  --  2.4* 2.3*  PHOS 3.2 2.9 3.0  --  3.5 3.9  CRP  --   --   --  9.8* 16.0* 15.6*  PROCALCITON  --   --   --  0.32 0.52 0.82  BNP  --   --   --  >4,500.0* 4,121.6* >4,500.0*        Signature  Lala Lund M.D on 01/26/2022 at 10:40 AM   -  To page go to [email or web address]

## 2022-01-26 NOTE — Progress Notes (Signed)
Pt has been accepted at [facility] on TTS. Will coordinate start date at clinic with pt's d/c date once known. Will assist as needed.   Melven Sartorius Renal Navigator [phone]

## 2022-01-26 NOTE — Progress Notes (Signed)
BUE vein mapping has been completed.   Results can be found under chart review under CV PROC. 01/26/2022 11:44 AM Khalil Belote RVT, RDMS

## 2022-01-26 NOTE — Final Progress Note (Signed)
Pt arrived back on unit from dialysis.

## 2022-01-27 DIAGNOSIS — N186 End stage renal disease: Secondary | ICD-10-CM | POA: Diagnosis not present

## 2022-01-27 DIAGNOSIS — L280 Lichen simplex chronicus: Secondary | ICD-10-CM | POA: Diagnosis not present

## 2022-01-27 DIAGNOSIS — Z992 Dependence on renal dialysis: Secondary | ICD-10-CM | POA: Diagnosis not present

## 2022-01-27 LAB — GLUCOSE, CAPILLARY
Glucose-Capillary: 112 mg/dL — ABNORMAL HIGH (ref 70–99)
Glucose-Capillary: 122 mg/dL — ABNORMAL HIGH (ref 70–99)
Glucose-Capillary: 124 mg/dL — ABNORMAL HIGH (ref 70–99)
Glucose-Capillary: 127 mg/dL — ABNORMAL HIGH (ref 70–99)
Glucose-Capillary: 129 mg/dL — ABNORMAL HIGH (ref 70–99)
Glucose-Capillary: 146 mg/dL — ABNORMAL HIGH (ref 70–99)
Glucose-Capillary: 147 mg/dL — ABNORMAL HIGH (ref 70–99)

## 2022-01-27 LAB — CBC WITH DIFFERENTIAL/PLATELET
Abs Immature Granulocytes: 0.06 10*3/uL (ref 0.00–0.07)
Basophils Absolute: 0.1 10*3/uL (ref 0.0–0.1)
Basophils Relative: 1 %
Eosinophils Absolute: 0.2 10*3/uL (ref 0.0–0.5)
Eosinophils Relative: 2 %
HCT: 28.8 % — ABNORMAL LOW (ref 36.0–46.0)
Hemoglobin: 9 g/dL — ABNORMAL LOW (ref 12.0–15.0)
Immature Granulocytes: 1 %
Lymphocytes Relative: 33 %
Lymphs Abs: 4.3 10*3/uL — ABNORMAL HIGH (ref 0.7–4.0)
MCH: 27.1 pg (ref 26.0–34.0)
MCHC: 31.3 g/dL (ref 30.0–36.0)
MCV: 86.7 fL (ref 80.0–100.0)
Monocytes Absolute: 1 10*3/uL (ref 0.1–1.0)
Monocytes Relative: 7 %
Neutro Abs: 7.5 10*3/uL (ref 1.7–7.7)
Neutrophils Relative %: 56 %
Platelets: 178 10*3/uL (ref 150–400)
RBC: 3.32 MIL/uL — ABNORMAL LOW (ref 3.87–5.11)
RDW: 17.2 % — ABNORMAL HIGH (ref 11.5–15.5)
WBC: 13.1 10*3/uL — ABNORMAL HIGH (ref 4.0–10.5)
nRBC: 0.5 % — ABNORMAL HIGH (ref 0.0–0.2)

## 2022-01-27 LAB — RENAL FUNCTION PANEL
Albumin: 2.8 g/dL — ABNORMAL LOW (ref 3.5–5.0)
Anion gap: 14 (ref 5–15)
BUN: 36 mg/dL — ABNORMAL HIGH (ref 6–20)
CO2: 24 mmol/L (ref 22–32)
Calcium: 8.9 mg/dL (ref 8.9–10.3)
Chloride: 95 mmol/L — ABNORMAL LOW (ref 98–111)
Creatinine, Ser: 3.07 mg/dL — ABNORMAL HIGH (ref 0.44–1.00)
GFR, Estimated: 17 mL/min — ABNORMAL LOW (ref >60)
Glucose, Bld: 124 mg/dL — ABNORMAL HIGH (ref 70–99)
Phosphorus: 3.2 mg/dL (ref 2.5–4.6)
Potassium: 3.8 mmol/L (ref 3.5–5.1)
Sodium: 133 mmol/L — ABNORMAL LOW (ref 135–145)

## 2022-01-27 LAB — C-REACTIVE PROTEIN: CRP: 12.6 mg/dL — ABNORMAL HIGH (ref <1.0)

## 2022-01-27 LAB — BRAIN NATRIURETIC PEPTIDE: B Natriuretic Peptide: >4500 pg/mL — ABNORMAL HIGH (ref 0.0–100.0)

## 2022-01-27 LAB — PROCALCITONIN: Procalcitonin: 0.9 ng/mL

## 2022-01-27 MED ORDER — GABAPENTIN 100 MG PO CAPS
200.0000 mg | ORAL_CAPSULE | Freq: Two times a day (BID) | ORAL | Status: DC
  Administered 2022-01-27 – 2022-02-01 (×10): 200 mg via ORAL
  Filled 2022-01-27 (×10): qty 2

## 2022-01-27 MED ORDER — HYDROMORPHONE HCL 1 MG/ML IJ SOLN
0.5000 mg | Freq: Four times a day (QID) | INTRAMUSCULAR | Status: DC | PRN
  Administered 2022-01-27 – 2022-02-01 (×11): 0.5 mg via INTRAVENOUS
  Filled 2022-01-27 (×13): qty 0.5

## 2022-01-27 MED ORDER — CLONAZEPAM 0.25 MG PO TBDP
0.5000 mg | ORAL_TABLET | Freq: Two times a day (BID) | ORAL | Status: DC
  Administered 2022-01-27 (×2): 0.5 mg via ORAL
  Filled 2022-01-27 (×2): qty 2

## 2022-01-27 MED ORDER — ESCITALOPRAM OXALATE 10 MG PO TABS
10.0000 mg | ORAL_TABLET | Freq: Every day | ORAL | Status: DC
  Administered 2022-01-29 – 2022-02-02 (×4): 10 mg via ORAL
  Filled 2022-01-27 (×6): qty 1

## 2022-01-27 NOTE — Progress Notes (Signed)
   01/26/22 2150  Vitals  Temp 97.6 F (36.4 C)  Temp Source Oral  BP 126/71  MAP (mmHg) 83  BP Location Left Arm  BP Method Automatic  Patient Position (if appropriate) Lying  Pulse Rate 90  Pulse Rate Source Monitor  ECG Heart Rate 90  Resp 18  Post Treatment  Dialyzer Clearance Heavily streaked  Duration of HD Treatment -hour(s) 3.5 hour(s)  Liters Processed 83.9  Fluid Removed (mL) 2500 mL  Tolerated HD Treatment Yes   TX fin. W/o difficulty.

## 2022-01-27 NOTE — Progress Notes (Signed)
[name] Progress Note     Assessment/ Plan:   # New ESRD - AKI on CKD stage IV/V now with progression to ESRD.  Previously palliative care involved and she did ulimately start HD last admission - per charting she just had hoped she would never need it and then they hoped had she enough renal function to come off.  First HD on 10/28 after tunn catheter with IR   - HD on 10/30 HD #2, HD #3 11/1 - MWF schedule thereafter; tolerated HD Mon with 2.5L net UF. Next HD on Wed. -  CLIP  in process; renal navigator confirmed she's accepted at [facility] TTS.  - needs perm access- appreciate VVS consult ->  vein mapping and outpt perm access to allow her to process. She has panic attacks and per pt being pressured makes her panic so will slowly and gently educate + encourage.  - she has very poor insight into her condition--> I am hoping her brother, who is on dialysis in 22 and who has been present for conversations related to her ESRD, can be a resource for her.  I am concerned that she will be poorly adherent to her dialysis and then will have a bad outcome. Very poor understanding of ESRD; she understands she will have to go TIW and was concerned with the impact on her life. She did not call her brother Molli Knock bec she didn't want to bother him at work.    # Hyperkalemia - resolved   # Confusion/encephalopathy - Concerning for uremia in the setting of CKD progression - now on HD - improved   # Hypotension - Hx of HTN - avoid hypotension - would hold imdur and coreg - Continue midodrine 5 mg TID (new med this admit)   # Chronic diastolic CHF  - holding imdur and coreg   - optimize volume status with HD    # Anemia CKD  - iron deficiency - repleted earlier in the month per charting - Started aranesp 40 mcg weekly on Sundays for now (last given 11/5)   # Renal osteodystrophy - No binder necessary; phos 3.2  #Fever: - no fevers in the last 48 hrs - new HD  cath - Foley d/c'd     Disposition - possibly SNF?  Subjective:   Sitting in bed, NAD. Cooperative but concerned about the impact of HD on her life.  Denies f/c/n/v/sob.      Objective:   BP (!) 118/99 (BP Location: Left Arm)   Pulse 96   Temp (P) 98.5 F (36.9 C)   Resp 15   Ht '5\' 7"'$  (1.702 m)   Wt (!) 142.7 kg   SpO2 98%   BMI 49.27 kg/m   Intake/Output Summary (Last 24 hours) at 01/27/2022 0754 Last data filed at 01/26/2022 2150 Gross per 24 hour  Intake 840 ml  Output 2500 ml  Net -1660 ml   Weight change:   Physical Exam: Gen: lying in bed, pleasant CVS: RRR Resp: clear Abd: soft Ext: 1+ LE edema ACCESS: R IJ TDC  Imaging: VAS [place] UPPER EXT VEIN MAPPING (PRE-OP AVF)  Result Date: 01/26/2022 [place] MAPPING Patient Name:  [name]  Date of Exam:   01/26/2022 Medical Rec #: [id number]       Accession #:    [id number] Date of Birth: [birth year]        Patient Gender: F Patient Age:   56 years [facility] Procedure:      VAS [place] UPPER EXT VEIN MAPPING (PRE-OP AVF) Referring Phys: EMMA COLLINS --------------------------------------------------------------------------------  Indications: Pre-access. Comparison Study: No previous exams Performing Technologist: Jody Hill RVT, RDMS  Examination Guidelines: A complete evaluation includes B-mode imaging, spectral Doppler, color Doppler, and power Doppler as needed of all accessible portions of each vessel. Bilateral testing is considered an integral part of a complete examination. Limited examinations for reoccurring indications may be performed as noted. +-----------------+-------------+----------+--------+ Right Cephalic   Diameter (cm)Depth (cm)Findings +-----------------+-------------+----------+--------+ Shoulder             0.36        1.37            +-----------------+-------------+----------+--------+ Prox upper arm       0.38        0.88             +-----------------+-------------+----------+--------+ Mid upper arm        0.35        1.01            +-----------------+-------------+----------+--------+ Dist upper arm       0.30        0.80            +-----------------+-------------+----------+--------+ Antecubital fossa    0.40        0.64            +-----------------+-------------+----------+--------+ Prox forearm         0.24        0.73            +-----------------+-------------+----------+--------+ Mid forearm          0.19        0.54            +-----------------+-------------+----------+--------+ Dist forearm         0.19        0.23            +-----------------+-------------+----------+--------+ Wrist                0.18        0.21            +-----------------+-------------+----------+--------+ +-----------------+-------------+----------+--------------+ Right Basilic    Diameter (cm)Depth (cm)   Findings    +-----------------+-------------+----------+--------------+ Prox upper arm       0.51                              +-----------------+-------------+----------+--------------+ Mid upper arm        0.42                              +-----------------+-------------+----------+--------------+ Dist upper arm       0.43                              +-----------------+-------------+----------+--------------+ Antecubital fossa    0.36                              +-----------------+-------------+----------+--------------+ Prox forearm                            not visualized +-----------------+-------------+----------+--------------+ Mid forearm  not visualized +-----------------+-------------+----------+--------------+ Distal forearm                          not visualized +-----------------+-------------+----------+--------------+ Wrist                                   not visualized +-----------------+-------------+----------+--------------+  +-----------------+-------------+----------+-------------------------------+ Left Cephalic    Diameter (cm)Depth (cm)           Findings             +-----------------+-------------+----------+-------------------------------+ Shoulder             0.38        2.23                                   +-----------------+-------------+----------+-------------------------------+ Prox upper arm       0.48        2.76                                   +-----------------+-------------+----------+-------------------------------+ Mid upper arm        0.45        2.05                                   +-----------------+-------------+----------+-------------------------------+ Dist upper arm       0.44        1.22                                   +-----------------+-------------+----------+-------------------------------+ Antecubital fossa    0.41        1.01              branching            +-----------------+-------------+----------+-------------------------------+ Prox forearm                            not visualized and IV placement +-----------------+-------------+----------+-------------------------------+ Mid forearm          0.28        1.46                                   +-----------------+-------------+----------+-------------------------------+ Dist forearm         0.27        0.51                                   +-----------------+-------------+----------+-------------------------------+ Wrist                0.12        0.20                                   +-----------------+-------------+----------+-------------------------------+ +-----------------+-------------+----------+--------------+ Left Basilic     Diameter (cm)Depth (cm)   Findings    +-----------------+-------------+----------+--------------+ Prox upper arm       0.31                              +-----------------+-------------+----------+--------------+  Mid upper arm        0.26                               +-----------------+-------------+----------+--------------+ Dist upper arm       0.28                              +-----------------+-------------+----------+--------------+ Antecubital fossa    0.29                              +-----------------+-------------+----------+--------------+ Prox forearm         0.21                 branching    +-----------------+-------------+----------+--------------+ Mid forearm          0.20                              +-----------------+-------------+----------+--------------+ Distal forearm       0.15                              +-----------------+-------------+----------+--------------+ Wrist                                   not visualized +-----------------+-------------+----------+--------------+ *See table(s) above for measurements and observations.  Diagnosing physician: Servando Snare MD Electronically signed by Servando Snare MD on 01/26/2022 at 6:02:59 PM.    Final    DG Foot Complete Right  Result Date: 01/25/2022 CLINICAL DATA:  Ulcer of right foot limited to breakdown of skin. EXAM: RIGHT FOOT COMPLETE - 3+ VIEW COMPARISON:  Radiograph 12/17/2021, foot MRI 12/20/2021. 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 There is section margins are similar to prior exam without frank erosive change. There is some heterotopic calcification developing. No erosive or bony destructive change. There is no tracking soft tissue gas or radiopaque foreign body. IMPRESSION: 1. Postsurgical change of the first toe and distal metatarsal. No radiographic findings of osteomyelitis. 2. 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 Electronically Signed   By: Keith Rake M.D.   On: 01/25/2022 17:14    Labs: BMET Recent Labs  Lab 01/21/22 0414 01/22/22 0326 01/23/22 0244 01/24/22 0351 01/25/22 0629 01/26/22 0421 01/27/22 0435  NA 137 135 136 133* 132* 134* 133*  K 3.4* 3.5  3.3* 3.9 3.7 3.9 3.8  CL 100 99 99 99 95* 95* 95*  CO2 '26 25 26 25 23 25 24  '$ GLUCOSE 129* 111* 127* 169* 160* 121* 124*  BUN 75* 43* 46* 37* 44* 52* 36*  CREATININE 5.64* 3.51* 3.35* 2.80* 3.25* 3.66* 3.07*  CALCIUM 8.7* 8.5* 8.5* 8.6* 8.7* 8.9 8.9  PHOS 4.5 3.2 2.9 3.0 3.5 3.9 3.2   CBC Recent Labs  Lab 01/24/22 0614 01/25/22 0629 01/26/22 0421 01/27/22 0435  WBC 13.5* 13.7* 13.2* 13.1*  NEUTROABS  --  8.2* 8.0* 7.5  HGB 9.3* 8.7* 8.2* 9.0*  HCT 30.8* 27.5* 25.7* 28.8*  MCV 88.0 86.2 86.0 86.7  PLT 166 160 171 178    Medications:     Chlorhexidine Gluconate Cloth  6 each Topical Q0600   cyclobenzaprine  5 mg Oral  TID   darbepoetin (ARANESP) injection - NON-DIALYSIS  40 mcg Subcutaneous Q Sun-1800   heparin  5,000 Units Subcutaneous Q8H   hydrocerin   Topical Daily   insulin aspart  0-9 Units Subcutaneous Q4H   metoprolol tartrate  25 mg Oral BID   midodrine  5 mg Oral TID WC   sodium chloride  2 spray Each Nare TID AC & HS   traMADol  50 mg Oral Once      Otelia Santee, MD 01/27/2022, 7:54 AM

## 2022-01-27 NOTE — Progress Notes (Signed)
PROGRESS NOTE  [name]  DOB: Mar 10, 1966  PCP: Jacklynn Ganong, MD YCX:[id number]  DOA: 01/16/2022  LOS: 11 days  Hospital Day: 12  Brief narrative: [name] is a 56 y.o. female with PMH significant for DM2, HTN, HLD, morbid obesity, OSA, CKD 5, CAD/CABG, chronic diastolic CHF.  Patient was recently hospitalized 9/27 to 10/17 for right foot diabetic ulcer for which podiatry and vascular surgery were consulted. Treated with antibiotics.  Hospitalization was complicated by development of generalized rash likely due to hypotension antibiotics.  Renal function worsened as well and hence patient needed dialysis.  Her renal function improved and she was discharged off dialysis with a creatinine of 2.5 on 10/17. 10/27, patient presented to OSH with altered mental status, lethargy, weakness.  Creatinine was elevated to 7.7, potassium elevated to 6.  Chest x-ray showed pulmonary edema.  Patient was given IV calcium gluconate, IV Lasix, insulin, dextrose and transferred to [facility]. Admitted to hospitalist service. Nephrology was consulted.  Patient was reinitiated on dialysis.   Subjective:   Patient in bed, appears comfortable, denies any headache, no fever, no chest pain or pressure, no shortness of breath , no abdominal pain. No new focal weakness.  Has bilateral lower extremity pain and episodes of severe anxiety.   Assessment and plan:  AKI on CKD 5, New ESRD - Presented with significantly elevated creatinine to 7.7 and potassium to 6.  Nephrology consulted. 10/28/2, tunnel catheter placed by IR.  Last dialysis this morning. Needs vein mapping for permanent access.  Vascular surgery to see. Patient has poor insight to her issues -high risk of noncompliance.  Acute on chronic heart failure with preserved ejection fraction (HFpEF), Hypotension , History of hypertension - Had volume overload at the time of admission.  Fluid removal through HD.  Midodrine for blood pressure support.   Chronic heart medications which are Coreg, Demadex and Imdur on hold due to hypotension.    Fever early morning 01/24/2022.  Chest x-ray stable, had a indwelling Foley which was removed, blood cultures drawn, remain negative.  Appears nontoxic.  Monitor closely.  Fevers have completely resolved.  Encephalopathy - due to uremia completely resolved.  Severe anxiety and bilateral lower extremity pain.  Added yesterday Klonopin, as needed Ativan for breakthrough anxiety, add Neurontin along with pain medications adjusted further.   Anemia of chronic disease - Hemoglobin stable around 9.  No acute issue.   Morbid obesity - -Body mass index is 49.27 kg/m. Patient has been advised to make an attempt to improve diet and exercise patterns to aid in weight loss.  OSA (obstructive sleep apnea)  - Continue CPAP nightly.   Right foot diabetic ulcer, 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 - Continue local wound care. Per wound care instructions, daily cleanse, pat dry and application of moisturizer (Eucerin cream) RLE wound will be covered with a folded layer of xeroform gauze topped with dry gauze. Both LEs are to be wrapped with Kerlix roll gauze and secured with paper tape. Feet are to be placed into Prevalon boots. A sacral silicone foam is to be placed for PI prevention.  On oral and IV pain medications, question some narcotic seeking behavior now, monitor cautiously.  During her last admission she was seen by Dr. Stanford Breed vascular surgeon and Dr. Loel Lofty podiatrist, she was again seen by podiatry this admission.  Continue supportive care.   Rash with antibiotics - skin flaking off. Moisturizers   Foley catheter placement upon admission- Inserted  at outside hospital prior to admission on 01/16/22 . Discussed with nephrology.  Foley has been removed on 01/24/2022.   Generalized body ache - Most likely in the setting of uremia. Improved with supportive Rx.   Type 2  diabetes mellitus with diabetic peripheral neuropathy- A1c 6.9 on 10/25/2021, PTA on Basaglar.  Currently on hold. Currently blood sugars controlled on sliding scale insulin Accu-Cheks. Gabapentin for neuropathy, currently on hold.  CBG (last 3)  Recent Labs    01/27/22 0117 01/27/22 0325 01/27/22 0754  GLUCAP 147* 129* 127*      Goals of care   Code Status: Full Code    Mobility: Encourage ambulation  Skin assessment:  Pressure Injury 12/28/21 Buttocks Right Stage 2 -  Partial thickness loss of dermis presenting as a shallow open injury with a red, pink wound bed without slough. (Active)  12/28/21 2300  Location: Buttocks  Location Orientation: Right  Staging: Stage 2 -  Partial thickness loss of dermis presenting as a shallow open injury with a red, pink wound bed without slough.  Wound Description (Comments):   Present on Admission: -- (I'm not sure)     Pressure Injury 12/28/21 Left Stage 2 -  Partial thickness loss of dermis presenting as a shallow open injury with a red, pink wound bed without slough. (Active)  12/28/21 2300  Location:   Location Orientation: Left  Staging: Stage 2 -  Partial thickness loss of dermis presenting as a shallow open injury with a red, pink wound bed without slough.  Wound Description (Comments):   Present on Admission: -- (I'm not sure)    Nutritional status:  Body mass index is 49.27 kg/m.       Active Pressure Injury/Wound(s)     Pressure Ulcer  Duration          Pressure Injury 12/28/21 Buttocks Right Stage 2 -  Partial thickness loss of dermis presenting as a shallow open injury with a red, pink wound bed without slough. 26 days   Pressure Injury 12/28/21 Left Stage 2 -  Partial thickness loss of dermis presenting as a shallow open injury with a red, pink wound bed without slough. 26 days             Diet:  Diet Order             Diet regular Room service appropriate? Yes; Fluid consistency: Thin  Diet effective now                    DVT prophylaxis:  heparin injection 5,000 Units Start: 01/17/22 0600    Fluid: None Consultants: Nephrology Family Communication: None at bedside  Status is: Inpatient  Continue in-hospital care because: Outpatient dialysis chair arrangement Level of care: Progressive   Dispo: The patient is from: Home              Anticipated d/c is to: SNF recommended by PT              Patient currently is not medically stable to d/c.   Difficult to place patient No  Scheduled Meds:  Chlorhexidine Gluconate Cloth  6 each Topical Q0600   clonazepam  0.5 mg Oral BID   cyclobenzaprine  5 mg Oral TID   darbepoetin (ARANESP) injection - NON-DIALYSIS  40 mcg Subcutaneous Q Sun-1800   escitalopram  10 mg Oral Daily   heparin  5,000 Units Subcutaneous Q8H   hydrocerin   Topical Daily   insulin aspart  0-9  Units Subcutaneous Q4H   metoprolol tartrate  25 mg Oral BID   midodrine  5 mg Oral TID WC   sodium chloride  2 spray Each Nare TID AC & HS   traMADol  50 mg Oral Once    PRN meds: acetaminophen **OR** acetaminophen, HYDROmorphone (DILAUDID) injection, LORazepam, ondansetron **OR** ondansetron (ZOFRAN) IV, mouth rinse, oxyCODONE, oxymetazoline, polyethylene glycol   Objective: Vitals:   01/27/22 0346 01/27/22 0751  BP: (!) 118/99 106/72  Pulse: 91 96  Resp: 16 15  Temp:  98.5 F (36.9 C)  SpO2: 98% 98%    Intake/Output Summary (Last 24 hours) at 01/27/2022 0846 Last data filed at 01/26/2022 2150 Gross per 24 hour  Intake 840 ml  Output 2500 ml  Net -1660 ml   Filed Weights   01/23/22 1228 01/26/22 1743 01/26/22 2150  Weight: (!) 144.9 kg (!) 145.2 kg (!) 142.7 kg   Weight change:  Body mass index is 49.27 kg/m.   Physical Exam:  Awake Alert, No new F.N deficits, Anxious affect [place].AT,PERRAL Supple Neck, No JVD,   Symmetrical Chest wall movement, Good air movement bilaterally, CTAB RRR,No Gallops, Rubs or new Murmurs,  +ve B.Sounds, Abd Soft, No  tenderness,   2+ lower extremity edema, bilateral Unna boots, chronic lichenification in her skin mostly in the lower extremities, right IJ HD catheter in place site clean.   Data Review: I have personally reviewed the laboratory data and studies available.  Recent Labs  Lab 01/22/22 0326 01/24/22 0614 01/25/22 0629 01/26/22 0421 01/27/22 0435  WBC 11.2* 13.5* 13.7* 13.2* 13.1*  HGB 9.4* 9.3* 8.7* 8.2* 9.0*  HCT 30.6* 30.8* 27.5* 25.7* 28.8*  PLT 167 166 160 171 178  MCV 86.4 88.0 86.2 86.0 86.7  MCH 26.6 26.6 27.3 27.4 27.1  MCHC 30.7 30.2 31.6 31.9 31.3  RDW 17.0* 16.7* 16.7* 16.7* 17.2*  LYMPHSABS  --   --  4.0 3.8 4.3*  MONOABS  --   --  1.2* 1.1* 1.0  EOSABS  --   --  0.1 0.1 0.2  BASOSABS  --   --  0.1 0.1 0.1    Recent Labs  Lab 01/23/22 0244 01/24/22 0351 01/24/22 0614 01/25/22 0629 01/26/22 0421 01/27/22 0435  NA 136 133*  --  132* 134* 133*  K 3.3* 3.9  --  3.7 3.9 3.8  CL 99 99  --  95* 95* 95*  CO2 26 25  --  '23 25 24  '$ GLUCOSE 127* 169*  --  160* 121* 124*  BUN 46* 37*  --  44* 52* 36*  CREATININE 3.35* 2.80*  --  3.25* 3.66* 3.07*  CALCIUM 8.5* 8.6*  --  8.7* 8.9 8.9  ALBUMIN 2.2* 2.5*  --  2.4* 2.3* 2.8*  PHOS 2.9 3.0  --  3.5 3.9 3.2  CRP  --   --  9.8* 16.0* 15.6* 12.6*  PROCALCITON  --   --  0.32 0.52 0.82 0.90  BNP  --   --  >4,500.0* 4,121.6* >4,500.0* >4,500.0*     Signature  Lala Lund M.D on 01/27/2022 at 8:46 AM   -  To page go to [email or web address]

## 2022-01-27 NOTE — TOC Progression Note (Signed)
Transition of Care [facility]) - Progression Note    Patient Details  Name: [name] MRN: [id number] Date of Birth: [birth year]  Transition of Care [facility]) CM/SW [place], [place] Work Phone Number: 01/27/2022, 11:31 AM  Clinical Narrative:    MSW intern spoke with patient at bedside and provided SNF bed offers. Patient continued to fall asleep during conversation where MSW intern continued to ask if patient wanted her to come back at a later time. Patient also would stop in the middle of conversation and ask random questions such as, "Do you have a piece of gum and what food is good to eat around here?" MSW intern continued to redirect the conversation and advised she was under the understanding patient was in agreement with going to SNF. Patient stated she told the doctor she was in agreement, but was not sure if she was now that she had thought about it. When asked what patient's hesitations are, patient advised she does not like the idea of spending the night away from home. Patient also stated she had several questions that she has been asking about the facilities but no one has been answering them. However when asked what her questions are, patient stated she could not think of them at this time other then wanting the star ratings for the facilities. MSW intern then asked if patient felt safe returning home. Patient stated she did. MSW intern then talked with patient about being able to be strong enough to go to the restroom if needed and to get out of the home if there was an emergency. Patient stated she did not feel as if she was strong enough for that right now. MSW intern asked if there was any family members she could reach out to for patient. Patient stated she has spoken with her son and daughter, but also needs to speak with her husband and preferred it be a private family conversation. MSW intern will bring patient star ratings this afternoon.   Expected Discharge  Plan: [place] Barriers to Discharge: Continued Medical Work up  Expected Discharge Plan and Services Expected Discharge Plan: [place] arrangements for the past 2 months: Single Family Home                                       Social Determinants of Health (SDOH) Interventions    Readmission Risk Interventions    12/18/2021    2:02 PM 11/05/2021    1:58 PM  Readmission Risk Prevention Plan  Transportation Screening Complete Complete  PCP or Specialist Appt within 5-7 Days  Complete  PCP or Specialist Appt within 3-5 Days Complete   Home Care Screening  Complete  Medication Review (RN CM)  Complete  HRI or Home Care Consult Complete   Social Work Consult for [place] Planning/Counseling Complete   Palliative Care Screening Not Applicable   Medication Review Press photographer) Complete

## 2022-01-27 NOTE — Progress Notes (Signed)
  Subjective:  Patient ID: [name], female    DOB: 1966-03-02,  MRN: [id number]  Patient arousable but somnolent  She would not participate in review of system Objective:   Vitals:   01/27/22 0751 01/27/22 1135  BP: 106/72 125/71  Pulse: 96 97  Resp: 15 16  Temp: 98.5 F (36.9 C) 99 F (37.2 C)  SpO2: 98% 100%   General AA&O x3. Normal mood and affect.  Vascular Unable to palpate pedal pulses, foot is warm and well-perfused  Neurologic Epicritic sensation grossly intact.  Dermatologic Chronic lichenification and hyperkeratosis on both feet and legs, partial-thickness skin breakdown anterior right tibia  Orthopedic: MMT 5/5 in dorsiflexion, plantarflexion, inversion, and eversion. Normal joint ROM without pain or crepitus.    Assessment & Plan:  Patient was evaluated and treated and all questions answered.  Chronic edema -Skin was evaluated on the feet today.  She does have partial skin breakdown on the anterior lower leg.  It is possible she is experiencing intermittent cellulitis from this but I do not think this is likely.  I do not see any source of infection within the foot itself that would indicate an abscess or osteomyelitis.  If there is continued concern for infection and no other source is found could evaluate further with forefoot and hindfoot MRIs but I do not think is likely to yield much information currently.  I agree with Dr. Jacqualyn Posey assessment that there is is not likely the source of her leukocytosis    Criselda Peaches, DPM  Accessible via secure chat for questions or concerns.

## 2022-01-27 NOTE — Progress Notes (Signed)
Physical Therapy Treatment Patient Details Name: [name] MRN: [id number] DOB: 1965/09/01 Today's Date: 01/27/2022   History of Present Illness [name] is a 56 y.o. female with PMH significant for DM2, HTN, HLD, morbid obesity, OSA, CKD 5, CAD/CABG, chronic diastolic CHF. She was previously admitted 9/27-10/17 with R foot ulcer, generalized rash and renal failure initiated HD, but with renal recovery.  Now admitted due to AMS, weakness, elevated creatinine and potassium, reinitiated dialysis.    PT Comments    Pt seen with mobility specialist as pt taking extended time on EOB and having difficulty staying awake. Pt only took a few steps away from bed before sitting in recliner.  Pt falling asleep during treatment and becomes irritated when have to repeatedly ask her is she is ready for next step. Very poor insight into her medical conditions, her current mobility limitations, and what going to HD will mean for her daily life. Continue to recommend SNF at dc and don't feel pt can successfully transition to home right now.   Recommendations for follow up therapy are one component of a multi-disciplinary discharge planning process, led by the attending physician.  Recommendations may be updated based on patient status, additional functional criteria and insurance authorization.  Follow Up Recommendations  Skilled nursing-short term rehab (<3 hours/day) Can patient physically be transported by private vehicle: Yes   Assistance Recommended at Discharge Frequent or constant Supervision/Assistance  Patient can return home with the following Two people to help with walking and/or transfers;Two people to help with bathing/dressing/bathroom;Assistance with cooking/housework;Help with stairs or ramp for entrance;Assist for transportation   Equipment Recommendations  None recommended by PT    Recommendations for Other Services       Precautions / Restrictions Precautions Precautions:  Fall Restrictions Weight Bearing Restrictions: No     Mobility  Bed Mobility               General bed mobility comments: Pt up on EOB with mobility specialist    Transfers Overall transfer level: Needs assistance Equipment used: Rolling walker (2 wheels) Transfers: Sit to/from Stand Sit to Stand: +2 physical assistance, Min assist           General transfer comment: Assist to bring hips up and for balance. Extended time on EOB prior to her being "ready" to stand up. Verbal cues for hand placement    Ambulation/Gait Ambulation/Gait assistance: Min assist, +2 safety/equipment Gait Distance (Feet): 2 Feet Assistive device: Rolling walker (2 wheels) Gait Pattern/deviations: Step-to pattern, Decreased step length - right, Decreased step length - left, Shuffle, Wide base of support Gait velocity: decr Gait velocity interpretation: <1.31 ft/sec, indicative of household ambulator   General Gait Details: Assist for balance and support. Pt only took a few shuffling steps away from bed before wanting to sit down in Dentist    Modified Rankin (Stroke Patients Only)       Balance Overall balance assessment: Needs assistance Sitting-balance support: No upper extremity supported, Feet supported Sitting balance-Leahy Scale: Fair     Standing balance support: Bilateral upper extremity supported, During functional activity, Reliant on assistive device for balance Standing balance-Leahy Scale: Poor Standing balance comment: walker and min assist for static standing                            Cognition Arousal/Alertness: Lethargic Behavior During Therapy:  WFL for tasks assessed/performed Overall Cognitive Status: Impaired/Different from baseline Area of Impairment: Safety/judgement, Awareness, Following commands, Attention                   Current Attention Level: Sustained Memory: Decreased  short-term memory Following Commands: Follows one step commands with increased time Safety/Judgement: Decreased awareness of deficits Awareness: Intellectual Problem Solving: Slow processing, Decreased initiation, Requires verbal cues General Comments: Pt falling asleep during treatment and becomes irritated when have to repeatedly ask her is she is ready for next step. Very poor insight into her medical conditions, her current mobility limitations, and what going to HD will mean for her daily life.        Exercises      General Comments        Pertinent Vitals/Pain Pain Assessment Pain Assessment: Faces Faces Pain Scale: No hurt    Home Living                          Prior Function            PT Goals (current goals can now be found in the care plan section) Acute Rehab PT Goals Patient Stated Goal: not stated Progress towards PT goals: Not progressing toward goals - comment    Frequency    Min 3X/week      PT Plan Current plan remains appropriate    Co-evaluation              AM-PAC PT "6 Clicks" Mobility   Outcome Measure  Help needed turning from your back to your side while in a flat bed without using bedrails?: A Lot Help needed moving from lying on your back to sitting on the side of a flat bed without using bedrails?: Total Help needed moving to and from a bed to a chair (including a wheelchair)?: Total Help needed standing up from a chair using your arms (e.g., wheelchair or bedside chair)?: Total Help needed to walk in hospital room?: Total Help needed climbing 3-5 steps with a railing? : Total 6 Click Score: 7    End of Session Equipment Utilized During Treatment: Gait belt Activity Tolerance: Patient limited by fatigue;Patient limited by lethargy Patient left: with call bell/phone within reach;in chair;with chair alarm set   PT Visit Diagnosis: Other abnormalities of gait and mobility (R26.89)     Time: 1050-1107 PT Time  Calculation (min) (ACUTE ONLY): 17 min  Charges:  $Therapeutic Activity: 8-22 mins                     [place] Office [place] 01/27/2022, 2:39 PM

## 2022-01-27 NOTE — Progress Notes (Signed)
Mobility Specialist Progress Note   01/27/22 1020  Mobility  Activity Dangled on edge of bed  Level of Assistance Moderate assist, patient does 50-74%  Assistive Device None (Bed pads)  Range of Motion/Exercises Active;All extremities  Activity Response Tolerated well   Pre Mobility:  HR 102 During Mobility: HR 114 Post Mobility: HR 107  Patient received in supine, A&O, slow to process and respond and slightly lethargic, occasionally needing stimuli to stay awake. Patient reluctant/unsure to participate initially but eventually agreed with max encouragement. Requires mod A, cues, and extra time to EOB from supine>side-lying>sitting. Upon dangling EOB pt complained "my head" but denied dizziness, lightheadedness, and pain. At end of session, patient remained dangling EOB for some time until PT arrived.   [name], [place], [place]  Office: [phone]

## 2022-01-27 NOTE — TOC Progression Note (Signed)
Transition of Care (TOC) - Progression Note    Patient Details  Name: [name] MRN: [id number] Date of Birth: 06/19/1965  Transition of Care (TOC) CM/SW Contact   S , LCSW Phone Number: 01/27/2022, 3:36 PM  Clinical Narrative:    CSW and MSW Intern met with patient again and provided Medicare ratings list for SNFs. Patient reported frustrated with situation, stating that it should be her decision whether or not to go to SNF and she knows how the care there is. CSW reported agreement that she is her own decision maker but that patient does have to make the decision one way or the other for SNF or home health as she is medically stable for discharge tomorrow. Patient stated she is not ready and does not have to discharge tomorrow as she knows her rights. CSW explained that patient can appeal the discharge with [place] once she is discharged if that is what she would like. She stated several times that she has questions that have not been answered but CSW and MSW Intern asked what questions she has but patient does not respond. CSW again offered to contact her family and she said no that it is her decision and someone already called them. CSW explained that it was not this CSW or Intern that called them. MSW Intern tried to engage patient in sharing her concerns about SNF placement but patient did not respond. She stated she had the information. CSW told her we would see her in the morning for her decision and ensured her call bell was in reach.    Expected Discharge Plan: Skilled Nursing Facility Barriers to Discharge: Continued Medical Work up  Expected Discharge Plan and Services Expected Discharge Plan: Skilled Nursing Facility       Living arrangements for the past 2 months: Single Family Home                                       Social Determinants of Health (SDOH) Interventions    Readmission Risk Interventions    12/18/2021    2:02 PM 11/05/2021    1:58  PM  Readmission Risk Prevention Plan  Transportation Screening Complete Complete  PCP or Specialist Appt within 5-7 Days  Complete  PCP or Specialist Appt within 3-5 Days Complete   Home Care Screening  Complete  Medication Review (RN CM)  Complete  HRI or Home Care Consult Complete   Social Work Consult for [place] Care Planning/Counseling Complete   Palliative Care Screening Not Applicable   Medication Review (RN Care Manager) Complete     

## 2022-01-28 DIAGNOSIS — Z992 Dependence on renal dialysis: Secondary | ICD-10-CM | POA: Diagnosis not present

## 2022-01-28 DIAGNOSIS — N186 End stage renal disease: Secondary | ICD-10-CM | POA: Diagnosis not present

## 2022-01-28 LAB — CBC WITH DIFFERENTIAL/PLATELET
Abs Immature Granulocytes: 0.07 10*3/uL (ref 0.00–0.07)
Basophils Absolute: 0.1 10*3/uL (ref 0.0–0.1)
Basophils Relative: 1 %
Eosinophils Absolute: 0.2 10*3/uL (ref 0.0–0.5)
Eosinophils Relative: 2 %
HCT: 26.9 % — ABNORMAL LOW (ref 36.0–46.0)
Hemoglobin: 8.7 g/dL — ABNORMAL LOW (ref 12.0–15.0)
Immature Granulocytes: 1 %
Lymphocytes Relative: 25 %
Lymphs Abs: 2.5 10*3/uL (ref 0.7–4.0)
MCH: 27.6 pg (ref 26.0–34.0)
MCHC: 32.3 g/dL (ref 30.0–36.0)
MCV: 85.4 fL (ref 80.0–100.0)
Monocytes Absolute: 0.8 10*3/uL (ref 0.1–1.0)
Monocytes Relative: 8 %
Neutro Abs: 6.3 10*3/uL (ref 1.7–7.7)
Neutrophils Relative %: 63 %
Platelets: 160 10*3/uL (ref 150–400)
RBC: 3.15 MIL/uL — ABNORMAL LOW (ref 3.87–5.11)
RDW: 17.8 % — ABNORMAL HIGH (ref 11.5–15.5)
WBC: 9.9 10*3/uL (ref 4.0–10.5)
nRBC: 0.7 % — ABNORMAL HIGH (ref 0.0–0.2)

## 2022-01-28 LAB — RENAL FUNCTION PANEL
Albumin: 2.5 g/dL — ABNORMAL LOW (ref 3.5–5.0)
Anion gap: 12 (ref 5–15)
BUN: 43 mg/dL — ABNORMAL HIGH (ref 6–20)
CO2: 26 mmol/L (ref 22–32)
Calcium: 8.9 mg/dL (ref 8.9–10.3)
Chloride: 94 mmol/L — ABNORMAL LOW (ref 98–111)
Creatinine, Ser: 3.96 mg/dL — ABNORMAL HIGH (ref 0.44–1.00)
GFR, Estimated: 13 mL/min — ABNORMAL LOW (ref >60)
Glucose, Bld: 145 mg/dL — ABNORMAL HIGH (ref 70–99)
Phosphorus: 3.4 mg/dL (ref 2.5–4.6)
Potassium: 3.8 mmol/L (ref 3.5–5.1)
Sodium: 132 mmol/L — ABNORMAL LOW (ref 135–145)

## 2022-01-28 LAB — C-REACTIVE PROTEIN: CRP: 10 mg/dL — ABNORMAL HIGH (ref <1.0)

## 2022-01-28 LAB — PROCALCITONIN: Procalcitonin: 0.91 ng/mL

## 2022-01-28 LAB — BRAIN NATRIURETIC PEPTIDE: B Natriuretic Peptide: 3263.3 pg/mL — ABNORMAL HIGH (ref 0.0–100.0)

## 2022-01-28 LAB — GLUCOSE, CAPILLARY
Glucose-Capillary: 137 mg/dL — ABNORMAL HIGH (ref 70–99)
Glucose-Capillary: 132 mg/dL — ABNORMAL HIGH (ref 70–99)
Glucose-Capillary: 126 mg/dL — ABNORMAL HIGH (ref 70–99)
Glucose-Capillary: 168 mg/dL — ABNORMAL HIGH (ref 70–99)

## 2022-01-28 MED ORDER — MIDODRINE HCL 5 MG PO TABS
10.0000 mg | ORAL_TABLET | Freq: Three times a day (TID) | ORAL | Status: DC
  Administered 2022-01-28 – 2022-02-03 (×19): 10 mg via ORAL
  Filled 2022-01-28 (×22): qty 2

## 2022-01-28 MED ORDER — HEPARIN SODIUM (PORCINE) 1000 UNIT/ML IJ SOLN
INTRAMUSCULAR | Status: AC
  Administered 2022-01-28: 3200 [IU]
  Filled 2022-01-28: qty 4

## 2022-01-28 MED ORDER — CLONAZEPAM 0.25 MG PO TBDP
0.5000 mg | ORAL_TABLET | Freq: Every day | ORAL | Status: DC
  Administered 2022-01-29 – 2022-02-01 (×4): 0.5 mg via ORAL
  Filled 2022-01-28 (×5): qty 2

## 2022-01-28 MED ORDER — METOPROLOL TARTRATE 25 MG PO TABS
25.0000 mg | ORAL_TABLET | Freq: Two times a day (BID) | ORAL | Status: DC
  Administered 2022-01-29 – 2022-02-02 (×8): 25 mg via ORAL
  Filled 2022-01-28 (×10): qty 1

## 2022-01-28 MED ORDER — LORAZEPAM 0.5 MG PO TABS
0.5000 mg | ORAL_TABLET | Freq: Two times a day (BID) | ORAL | Status: DC | PRN
  Administered 2022-01-29 – 2022-01-30 (×2): 0.5 mg via ORAL
  Filled 2022-01-28 (×2): qty 1

## 2022-01-28 NOTE — Progress Notes (Signed)
OT Cancellation Note  Patient Details Name: Lane Kjos MRN: [id number] DOB: 20-Aug-1965   Cancelled Treatment:    Reason Eval/Treat Not Completed: Patient at procedure or test/ unavailable  Malka So 01/28/2022, 8:16 AM Cleta Alberts, OTR/L Acute Rehabilitation Services Office: [phone]

## 2022-01-28 NOTE — Progress Notes (Signed)
   01/28/22 1235  Pain Assessment  Pain Scale 0-10  Pain Score 0  Respiratory  Respiratory Pattern Regular;Unlabored  Chest Assessment Chest expansion symmetrical  Bilateral Breath Sounds Clear   Received patient in bed to unit.  Alert and oriented.  Informed consent signed and in chart.   Treatment initiated: 0802 Treatment completed: 5329J  Patient tolerated well.  Transported back to the room  Alert, without acute distress.  Hand-off given to patient's nurse.   Access used: Yes Access issues: No  Total UF removed: 3000 Medication(s) given: Dilaudid 0.'5mg'$  Post HD VS: 98.3, 103/70, 89,15, Spo2 98% 3L N/C Post HD weight: 140.8 kg   [facility]

## 2022-01-28 NOTE — Progress Notes (Signed)
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  Previously palliative care involved and she did ulimately start HD last admission - per charting she just had hoped she would never need it and then they hoped had she enough renal function to come off.  First HD on 10/28 after tunn catheter with IR   - HD on 10/30 HD #2, HD #3 11/1 - MWF schedule   Seen on HD  With 4K bath RIJ TC Goal UF 3L as tolerated 86/54 but asymp and mentating, answering questions appropriately  -  CLIP  in process; renal navigator confirmed she's accepted at [facility].  - needs perm access- appreciate VVS consult ->  vein mapping and outpt perm access to allow her to process. She has panic attacks and per pt being pressured makes her panic so will slowly and gently educate + encourage.  - she has very poor insight into her condition--> I am hoping her brother, who is on dialysis in [place] and who has been present for conversations related to her ESRD, can be a resource for her.  I am concerned that she will be poorly adherent to her dialysis and then will have a bad outcome. Very poor understanding of ESRD; she understands she will have to go TIW and was concerned with the impact on her life. She did not call her brother Molli Knock bec she didn't want to bother him at work.    # Hyperkalemia - resolved   # Confusion/encephalopathy - Concerning for uremia in the setting of CKD progression - now on HD - improved   # Hypotension - Hx of HTN - avoid hypotension - would hold imdur and coreg. Still on the low side. - Continue midodrine 5 mg TID (new med this admit)   # Chronic diastolic CHF  - holding imdur and coreg   - optimize volume status with HD    # Anemia CKD  - iron deficiency - repleted earlier in the month per charting - Started aranesp 40 mcg weekly on Sundays for now (last given 11/5)   # Renal  osteodystrophy - No binder necessary; phos 3.2  #Fever: - no fevers in the last 48 hrs - new HD cath - Foley d/c'd     Disposition - possibly SNF?  Subjective:   Seen in dialysis, NAD. Denies f/c/n/v/sob.      Objective:   BP (!) 85/41   Pulse 90   Temp 98.4 F (36.9 C) (Oral)   Resp 10   Ht '5\' 7"'$  (1.702 m)   Wt (!) 143.8 kg   SpO2 100%   BMI 49.65 kg/m   Intake/Output Summary (Last 24 hours) at 01/28/2022 1028 Last data filed at 01/28/2022 0500 Gross per 24 hour  Intake 240 ml  Output 201 ml  Net 39 ml   Weight change:   Physical Exam: Gen: lying in bed, pleasant CVS: RRR Resp: clear Abd: soft Ext: 1+ LE edema ACCESS: R IJ TDC hooked up  Imaging: VAS [place] UPPER EXT VEIN MAPPING (PRE-OP AVF)  Result Date: 01/26/2022 [place] MAPPING Patient Name:  [name]  Date of Exam:   01/26/2022 Medical Rec #: [id number]       Accession #:    [id number] Date of Birth: 10/19/1965        Patient Gender: F Patient Age:   56 years Exam  Location:  [facility] Procedure:      VAS [place] UPPER EXT VEIN MAPPING (PRE-OP AVF) Referring Phys: EMMA COLLINS --------------------------------------------------------------------------------  Indications: Pre-access. Comparison Study: No previous exams Performing Technologist: Jody Hill RVT, RDMS  Examination Guidelines: A complete evaluation includes B-mode imaging, spectral Doppler, color Doppler, and power Doppler as needed of all accessible portions of each vessel. Bilateral testing is considered an integral part of a complete examination. Limited examinations for reoccurring indications may be performed as noted. +-----------------+-------------+----------+--------+ Right Cephalic   Diameter (cm)Depth (cm)Findings +-----------------+-------------+----------+--------+ Shoulder             0.36        1.37            +-----------------+-------------+----------+--------+ Prox upper arm       0.38        0.88             +-----------------+-------------+----------+--------+ Mid upper arm        0.35        1.01            +-----------------+-------------+----------+--------+ Dist upper arm       0.30        0.80            +-----------------+-------------+----------+--------+ Antecubital fossa    0.40        0.64            +-----------------+-------------+----------+--------+ Prox forearm         0.24        0.73            +-----------------+-------------+----------+--------+ Mid forearm          0.19        0.54            +-----------------+-------------+----------+--------+ Dist forearm         0.19        0.23            +-----------------+-------------+----------+--------+ Wrist                0.18        0.21            +-----------------+-------------+----------+--------+ +-----------------+-------------+----------+--------------+ Right Basilic    Diameter (cm)Depth (cm)   Findings    +-----------------+-------------+----------+--------------+ Prox upper arm       0.51                              +-----------------+-------------+----------+--------------+ Mid upper arm        0.42                              +-----------------+-------------+----------+--------------+ Dist upper arm       0.43                              +-----------------+-------------+----------+--------------+ Antecubital fossa    0.36                              +-----------------+-------------+----------+--------------+ Prox forearm                            not visualized +-----------------+-------------+----------+--------------+ Mid forearm  not visualized +-----------------+-------------+----------+--------------+ Distal forearm                          not visualized +-----------------+-------------+----------+--------------+ Wrist                                   not visualized +-----------------+-------------+----------+--------------+  +-----------------+-------------+----------+-------------------------------+ Left Cephalic    Diameter (cm)Depth (cm)           Findings             +-----------------+-------------+----------+-------------------------------+ Shoulder             0.38        2.23                                   +-----------------+-------------+----------+-------------------------------+ Prox upper arm       0.48        2.76                                   +-----------------+-------------+----------+-------------------------------+ Mid upper arm        0.45        2.05                                   +-----------------+-------------+----------+-------------------------------+ Dist upper arm       0.44        1.22                                   +-----------------+-------------+----------+-------------------------------+ Antecubital fossa    0.41        1.01              branching            +-----------------+-------------+----------+-------------------------------+ Prox forearm                            not visualized and IV placement +-----------------+-------------+----------+-------------------------------+ Mid forearm          0.28        1.46                                   +-----------------+-------------+----------+-------------------------------+ Dist forearm         0.27        0.51                                   +-----------------+-------------+----------+-------------------------------+ Wrist                0.12        0.20                                   +-----------------+-------------+----------+-------------------------------+ +-----------------+-------------+----------+--------------+ Left Basilic     Diameter (cm)Depth (cm)   Findings    +-----------------+-------------+----------+--------------+ Prox upper arm       0.31                              +-----------------+-------------+----------+--------------+  Mid upper arm        0.26                               +-----------------+-------------+----------+--------------+ Dist upper arm       0.28                              +-----------------+-------------+----------+--------------+ Antecubital fossa    0.29                              +-----------------+-------------+----------+--------------+ Prox forearm         0.21                 branching    +-----------------+-------------+----------+--------------+ Mid forearm          0.20                              +-----------------+-------------+----------+--------------+ Distal forearm       0.15                              +-----------------+-------------+----------+--------------+ Wrist                                   not visualized +-----------------+-------------+----------+--------------+ *See table(s) above for measurements and observations.  Diagnosing physician: Servando Snare MD Electronically signed by Servando Snare MD on 01/26/2022 at 6:02:59 PM.    Final     Labs: BMET Recent Labs  Lab 01/22/22 2229 01/23/22 0244 01/24/22 7989 01/25/22 2119 01/26/22 0421 01/27/22 0435 01/28/22 0639  NA 135 136 133* 132* 134* 133* 132*  K 3.5 3.3* 3.9 3.7 3.9 3.8 3.8  CL 99 99 99 95* 95* 95* 94*  CO2 '25 26 25 23 25 24 26  '$ GLUCOSE 111* 127* 169* 160* 121* 124* 145*  BUN 43* 46* 37* 44* 52* 36* 43*  CREATININE 3.51* 3.35* 2.80* 3.25* 3.66* 3.07* 3.96*  CALCIUM 8.5* 8.5* 8.6* 8.7* 8.9 8.9 8.9  PHOS 3.2 2.9 3.0 3.5 3.9 3.2 3.4   CBC Recent Labs  Lab 01/25/22 0629 01/26/22 0421 01/27/22 0435 01/28/22 0639  WBC 13.7* 13.2* 13.1* 9.9  NEUTROABS 8.2* 8.0* 7.5 6.3  HGB 8.7* 8.2* 9.0* 8.7*  HCT 27.5* 25.7* 28.8* 26.9*  MCV 86.2 86.0 86.7 85.4  PLT 160 171 178 160    Medications:     Chlorhexidine Gluconate Cloth  6 each Topical Q0600   clonazepam  0.5 mg Oral QHS   cyclobenzaprine  5 mg Oral TID   darbepoetin (ARANESP) injection - NON-DIALYSIS  40 mcg Subcutaneous Q Sun-1800   escitalopram   10 mg Oral Daily   gabapentin  200 mg Oral BID   heparin  5,000 Units Subcutaneous Q8H   hydrocerin   Topical Daily   insulin aspart  0-9 Units Subcutaneous Q4H   metoprolol tartrate  25 mg Oral BID   midodrine  10 mg Oral TID WC   sodium chloride  2 spray Each Nare TID AC & HS   traMADol  50 mg Oral Once      Otelia Santee, MD 01/28/2022, 10:28 AM

## 2022-01-28 NOTE — Progress Notes (Signed)
Mobility Specialist Progress Note:   01/28/22 1630  Mobility  Activity Turned to right side;Turned to left side (Repositioned in bed)  Level of Assistance +2 (takes two people)  Activity Response Tolerated well  Mobility Referral Yes  $Mobility charge 1 Mobility   Pt received laying in bed and requesting repositioning to eat dinner. Assisted by RN staff. No complaints. Eager for future mobility. Pt left sitting up in bed with all needs met and call bell in reach.   [facility] Mobility Specialist-Acute Rehab Secure Chat only

## 2022-01-28 NOTE — Progress Notes (Signed)
PROGRESS NOTE  [name]  DOB: [birth year]  PCP: Jacklynn Ganong, MD NLZ:[id number]  DOA: 01/16/2022  LOS: 12 days  Hospital Day: 13  Brief narrative: [name] is a 56 y.o. female with PMH significant for DM2, HTN, HLD, morbid obesity, OSA, CKD 5, CAD/CABG, chronic diastolic CHF.  Patient was recently hospitalized 9/27 to 10/17 for right foot diabetic ulcer for which podiatry and vascular surgery were consulted. Treated with antibiotics.  Hospitalization was complicated by development of generalized rash likely due to hypotension antibiotics.  Renal function worsened as well and hence patient needed dialysis.  Her renal function improved and she was discharged off dialysis with a creatinine of 2.5 on 10/17. 10/27, patient presented to OSH with altered mental status, lethargy, weakness.  Creatinine was elevated to 7.7, potassium elevated to 6.  Chest x-ray showed pulmonary edema.  Patient was given IV calcium gluconate, IV Lasix, insulin, dextrose and transferred to [facility] Admitted to hospitalist service. Nephrology was consulted.  Patient was reinitiated on dialysis.   Subjective:   Patient in bed having HD, mild anxiety, mild lower extremity pain which is chronic, no headache, no chest pain or shortness of breath.   Assessment and plan:  AKI on CKD 5, New ESRD - Presented with significantly elevated creatinine to 7.7 and potassium to 6.  Nephrology consulted. 10/28/2, tunnel catheter placed by IR.  Last dialysis this morning. Needs vein mapping for permanent access.  Vascular surgery to see. Patient has poor insight to her issues -high risk of noncompliance.  Now agreeable for SNF placement and for HD follow-up outpatient.  Acute on chronic heart failure with preserved ejection fraction (HFpEF), Hypotension , History of hypertension - Had volume overload at the time of admission.  Fluid removal through HD.  Midodrine for blood pressure support.  Chronic heart medications which  are Coreg, Demadex and Imdur on hold due to hypotension.    Fever early morning 01/24/2022.  Chest x-ray stable, had a indwelling Foley which was removed, blood cultures drawn, remain negative.  Appears nontoxic.  Monitor closely.  Fevers have completely resolved.  Encephalopathy - due to uremia completely resolved.  Severe anxiety and bilateral lower extremity pain.  Added yesterday Klonopin, as needed Ativan for breakthrough anxiety, add Neurontin along with pain medications adjusted further.   Anemia of chronic disease - Hemoglobin stable around 9.  No acute issue.   Morbid obesity - -Body mass index is 49.65 kg/m. Patient has been advised to make an attempt to improve diet and exercise patterns to aid in weight loss.  OSA (obstructive sleep apnea)  - Continue CPAP nightly.   Right foot diabetic ulcer, 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 - Continue local wound care. Per wound care instructions, daily cleanse, pat dry and application of moisturizer (Eucerin cream) RLE wound will be covered with a folded layer of xeroform gauze topped with dry gauze. Both LEs are to be wrapped with Kerlix roll gauze and secured with paper tape. Feet are to be placed into Prevalon boots. A sacral silicone foam is to be placed for PI prevention.  On oral and IV pain medications, question some narcotic seeking behavior now, monitor cautiously.  During her last admission she was seen by Dr. Stanford Breed vascular surgeon and Dr. Loel Lofty podiatrist, she was again seen by podiatry this admission.  Continue supportive care.   Rash with antibiotics - skin flaking off. Moisturizers   Foley catheter placement upon admission- Inserted at outside hospital prior  to admission on 01/16/22 . Discussed with nephrology.  Foley has been removed on 01/24/2022.   Generalized body ache - Most likely in the setting of uremia. Improved with supportive Rx.   Type 2 diabetes mellitus with diabetic  peripheral neuropathy- A1c 6.9 on 10/25/2021, PTA on Basaglar.  Currently on hold. Currently blood sugars controlled on sliding scale insulin Accu-Cheks. Gabapentin for neuropathy, currently on hold.  CBG (last 3)  Recent Labs    01/27/22 2338 01/28/22 0317 01/28/22 0521  GLUCAP 146* 137* 132*      Goals of care   Code Status: Full Code    Mobility: Encourage ambulation  Skin assessment:  Pressure Injury 12/28/21 Buttocks Right Stage 2 -  Partial thickness loss of dermis presenting as a shallow open injury with a red, pink wound bed without slough. (Active)  12/28/21 2300  Location: Buttocks  Location Orientation: Right  Staging: Stage 2 -  Partial thickness loss of dermis presenting as a shallow open injury with a red, pink wound bed without slough.  Wound Description (Comments):   Present on Admission: -- (I'm not sure)     Pressure Injury 12/28/21 Left Stage 2 -  Partial thickness loss of dermis presenting as a shallow open injury with a red, pink wound bed without slough. (Active)  12/28/21 2300  Location:   Location Orientation: Left  Staging: Stage 2 -  Partial thickness loss of dermis presenting as a shallow open injury with a red, pink wound bed without slough.  Wound Description (Comments):   Present on Admission: -- (I'm not sure)    Nutritional status:  Body mass index is 49.65 kg/m.       Active Pressure Injury/Wound(s)     Pressure Ulcer  Duration          Pressure Injury 12/28/21 Buttocks Right Stage 2 -  Partial thickness loss of dermis presenting as a shallow open injury with a red, pink wound bed without slough. 26 days   Pressure Injury 12/28/21 Left Stage 2 -  Partial thickness loss of dermis presenting as a shallow open injury with a red, pink wound bed without slough. 26 days             Diet:  Diet Order             Diet regular Room service appropriate? Yes; Fluid consistency: Thin  Diet effective now                   DVT  prophylaxis:  heparin injection 5,000 Units Start: 01/17/22 0600    Fluid: None Consultants: Nephrology Family Communication: None at bedside  Status is: Inpatient  Continue in-hospital care because: Outpatient dialysis chair arrangement Level of care: Progressive   Dispo: The patient is from: Home              Anticipated d/c is to: SNF recommended by PT              Patient currently is not medically stable to d/c.   Difficult to place patient No  Scheduled Meds:  Chlorhexidine Gluconate Cloth  6 each Topical Q0600   clonazepam  0.5 mg Oral QHS   cyclobenzaprine  5 mg Oral TID   darbepoetin (ARANESP) injection - NON-DIALYSIS  40 mcg Subcutaneous Q Sun-1800   escitalopram  10 mg Oral Daily   gabapentin  200 mg Oral BID   heparin  5,000 Units Subcutaneous Q8H   hydrocerin   Topical Daily  insulin aspart  0-9 Units Subcutaneous Q4H   metoprolol tartrate  25 mg Oral BID   midodrine  10 mg Oral TID WC   sodium chloride  2 spray Each Nare TID AC & HS   traMADol  50 mg Oral Once    PRN meds: acetaminophen **OR** acetaminophen, HYDROmorphone (DILAUDID) injection, LORazepam, ondansetron **OR** ondansetron (ZOFRAN) IV, mouth rinse, oxyCODONE, oxymetazoline, polyethylene glycol   Objective: Vitals:   01/28/22 0800 01/28/22 0815  BP: 102/68 111/79  Pulse: 88 86  Resp: 12 15  Temp: 98.4 F (36.9 C)   SpO2: 100% 100%    Intake/Output Summary (Last 24 hours) at 01/28/2022 0848 Last data filed at 01/28/2022 0500 Gross per 24 hour  Intake 480 ml  Output 201 ml  Net 279 ml   Filed Weights   01/26/22 1743 01/26/22 2150 01/28/22 0800  Weight: (!) 145.2 kg (!) 142.7 kg (!) 143.8 kg   Weight change:  Body mass index is 49.65 kg/m.   Physical Exam:  Awake Alert, No new F.N deficits, Anxious affect [place].AT,PERRAL Supple Neck, No JVD,   Symmetrical Chest wall movement, Good air movement bilaterally, CTAB RRR,No Gallops, Rubs or new Murmurs,  +ve B.Sounds, Abd Soft, No  tenderness,   2+ lower extremity edema, bilateral [place] boots, chronic lichenification in her skin mostly in the lower extremities, right IJ HD catheter in place site clean.   Data Review: I have personally reviewed the laboratory data and studies available.  Recent Labs  Lab 01/24/22 0614 01/25/22 0629 01/26/22 0421 01/27/22 0435 01/28/22 0639  WBC 13.5* 13.7* 13.2* 13.1* 9.9  HGB 9.3* 8.7* 8.2* 9.0* 8.7*  HCT 30.8* 27.5* 25.7* 28.8* 26.9*  PLT 166 160 171 178 160  MCV 88.0 86.2 86.0 86.7 85.4  MCH 26.6 27.3 27.4 27.1 27.6  MCHC 30.2 31.6 31.9 31.3 32.3  RDW 16.7* 16.7* 16.7* 17.2* 17.8*  LYMPHSABS  --  4.0 3.8 4.3* 2.5  MONOABS  --  1.2* 1.1* 1.0 0.8  EOSABS  --  0.1 0.1 0.2 0.2  BASOSABS  --  0.1 0.1 0.1 0.1    Recent Labs  Lab 01/24/22 0351 01/24/22 0614 01/25/22 0629 01/26/22 0421 01/27/22 0435 01/28/22 0639  NA 133*  --  132* 134* 133* 132*  K 3.9  --  3.7 3.9 3.8 3.8  CL 99  --  95* 95* 95* 94*  CO2 25  --  '23 25 24 26  '$ GLUCOSE 169*  --  160* 121* 124* 145*  BUN 37*  --  44* 52* 36* 43*  CREATININE 2.80*  --  3.25* 3.66* 3.07* 3.96*  CALCIUM 8.6*  --  8.7* 8.9 8.9 8.9  ALBUMIN 2.5*  --  2.4* 2.3* 2.8* 2.5*  PHOS 3.0  --  3.5 3.9 3.2 3.4  CRP  --  9.8* 16.0* 15.6* 12.6*  --   PROCALCITON  --  0.32 0.52 0.82 0.90 0.91  BNP  --  >4,500.0* 4,121.6* >4,500.0* >4,500.0* 3,263.3*     Signature  Lala Lund M.D on 01/28/2022 at 8:48 AM   -  To page go to [email or web address]

## 2022-01-29 DIAGNOSIS — N179 Acute kidney failure, unspecified: Secondary | ICD-10-CM | POA: Diagnosis not present

## 2022-01-29 DIAGNOSIS — N184 Chronic kidney disease, stage 4 (severe): Secondary | ICD-10-CM | POA: Diagnosis not present

## 2022-01-29 LAB — CBC WITH DIFFERENTIAL/PLATELET
Abs Immature Granulocytes: 0.05 10*3/uL (ref 0.00–0.07)
Basophils Absolute: 0.1 10*3/uL (ref 0.0–0.1)
Basophils Relative: 1 %
Eosinophils Absolute: 0.2 10*3/uL (ref 0.0–0.5)
Eosinophils Relative: 2 %
HCT: 28.6 % — ABNORMAL LOW (ref 36.0–46.0)
Hemoglobin: 8.7 g/dL — ABNORMAL LOW (ref 12.0–15.0)
Immature Granulocytes: 1 %
Lymphocytes Relative: 36 %
Lymphs Abs: 3 10*3/uL (ref 0.7–4.0)
MCH: 27.3 pg (ref 26.0–34.0)
MCHC: 30.4 g/dL (ref 30.0–36.0)
MCV: 89.7 fL (ref 80.0–100.0)
Monocytes Absolute: 0.8 10*3/uL (ref 0.1–1.0)
Monocytes Relative: 9 %
Neutro Abs: 4.5 10*3/uL (ref 1.7–7.7)
Neutrophils Relative %: 51 %
Platelets: 141 10*3/uL — ABNORMAL LOW (ref 150–400)
RBC: 3.19 MIL/uL — ABNORMAL LOW (ref 3.87–5.11)
RDW: 18 % — ABNORMAL HIGH (ref 11.5–15.5)
WBC: 8.5 10*3/uL (ref 4.0–10.5)
nRBC: 0.5 % — ABNORMAL HIGH (ref 0.0–0.2)

## 2022-01-29 LAB — RENAL FUNCTION PANEL
Albumin: 2.5 g/dL — ABNORMAL LOW (ref 3.5–5.0)
Anion gap: 10 (ref 5–15)
BUN: 27 mg/dL — ABNORMAL HIGH (ref 6–20)
CO2: 27 mmol/L (ref 22–32)
Calcium: 8.9 mg/dL (ref 8.9–10.3)
Chloride: 96 mmol/L — ABNORMAL LOW (ref 98–111)
Creatinine, Ser: 3.28 mg/dL — ABNORMAL HIGH (ref 0.44–1.00)
GFR, Estimated: 16 mL/min — ABNORMAL LOW (ref >60)
Glucose, Bld: 148 mg/dL — ABNORMAL HIGH (ref 70–99)
Phosphorus: 2.9 mg/dL (ref 2.5–4.6)
Potassium: 3.6 mmol/L (ref 3.5–5.1)
Sodium: 133 mmol/L — ABNORMAL LOW (ref 135–145)

## 2022-01-29 LAB — GLUCOSE, CAPILLARY
Glucose-Capillary: 135 mg/dL — ABNORMAL HIGH (ref 70–99)
Glucose-Capillary: 137 mg/dL — ABNORMAL HIGH (ref 70–99)
Glucose-Capillary: 155 mg/dL — ABNORMAL HIGH (ref 70–99)
Glucose-Capillary: 169 mg/dL — ABNORMAL HIGH (ref 70–99)
Glucose-Capillary: 176 mg/dL — ABNORMAL HIGH (ref 70–99)
Glucose-Capillary: 178 mg/dL — ABNORMAL HIGH (ref 70–99)
Glucose-Capillary: 205 mg/dL — ABNORMAL HIGH (ref 70–99)

## 2022-01-29 LAB — CULTURE, BLOOD (ROUTINE X 2)
Culture: NO GROWTH
Culture: NO GROWTH

## 2022-01-29 MED ORDER — HYDROXYZINE HCL 25 MG PO TABS
25.0000 mg | ORAL_TABLET | Freq: Three times a day (TID) | ORAL | Status: DC | PRN
  Administered 2022-01-29 – 2022-02-02 (×2): 25 mg via ORAL
  Filled 2022-01-29 (×2): qty 1

## 2022-01-29 NOTE — Progress Notes (Signed)
Pt refused wound care.

## 2022-01-29 NOTE — Progress Notes (Signed)
Occupational Therapy Treatment Patient Details Name: [name] MRN: [id number] DOB: 26-May-1965 Today's Date: 01/29/2022   History of present illness [name] is a 56 y.o. female with PMH significant for DM2, HTN, HLD, morbid obesity, OSA, CKD 5, CAD/CABG, chronic diastolic CHF. She was previously admitted 9/27-10/17 with R foot ulcer, generalized rash and renal failure initiated HD, but with renal recovery.  Now admitted due to AMS, weakness, elevated creatinine and potassium, reinitiated dialysis.   OT comments  Patient with incremental progress toward patient focused goals.  Needing up to Max A for ADL and Mod A of up to 2 for sit to stand transfers.  SNF continues to be needed for post acute rehab prior to returning home.  OT will continue to follow in the acute setting.     Patient can return home with the following  A lot of help with bathing/dressing/bathroom;A lot of help with walking and/or transfers;Help with stairs or ramp for entrance;Assist for transportation;Assistance with Education officer, environmental (measurements OT);Wheelchair cushion (measurements OT)    Recommendations for Other Services      Precautions / Restrictions Precautions Precautions: Fall Precaution Comments: monitor LE skin, watch BP Restrictions Weight Bearing Restrictions: No RLE Weight Bearing: Weight bearing as tolerated Other Position/Activity Restrictions: wound to R foot       Mobility Bed Mobility Overal bed mobility: Needs Assistance Bed Mobility: Sit to Supine       Sit to supine: Mod assist     Patient Response: Cooperative  Transfers Overall transfer level: Needs assistance Equipment used: Rolling walker (2 wheels) Transfers: Sit to/from Stand, Bed to chair/wheelchair/BSC Sit to Stand: Mod assist, Max assist Stand pivot transfers: +2 safety/equipment, Min assist               Balance Overall balance assessment: Needs  assistance Sitting-balance support: No upper extremity supported, Feet supported Sitting balance-Leahy Scale: Fair     Standing balance support: Reliant on assistive device for balance Standing balance-Leahy Scale: Poor                             ADL either performed or assessed with clinical judgement   ADL   Eating/Feeding: Set up;Sitting       Upper Body Bathing: Moderate assistance;Sitting       Upper Body Dressing : Moderate assistance;Sitting       Toilet Transfer: Moderate assistance;+2 for physical assistance;BSC/3in1;Rolling walker (2 wheels)                                                  Cognition Arousal/Alertness: Awake/alert Behavior During Therapy: WFL for tasks assessed/performed Overall Cognitive Status: Within Functional Limits for tasks assessed                               Problem Solving: Slow processing, Decreased initiation                       General Comments VSS on 2L    Pertinent Vitals/ Pain       Pain Assessment Pain Assessment: No/denies pain Pain Intervention(s): Monitored during session  Frequency  Min 2X/week        Progress Toward Goals  OT Goals(current goals can now be found in the care plan section)  Progress towards OT goals: Progressing toward goals  Acute Rehab OT Goals OT Goal Formulation: With patient Time For Goal Achievement: 02/12/22 Potential to Achieve Goals: Good ADL Goals Pt Will Perform Grooming: with set-up;sitting Pt Will Perform Upper Body Bathing: with set-up;sitting Pt Will Perform Upper Body Dressing: with set-up;sitting Pt Will Transfer to Toilet: with supervision;stand pivot transfer;bedside commode Pt/caregiver will Perform Home Exercise Program: Increased strength;Both right and left upper extremity;With Supervision  Plan Discharge plan remains  appropriate    Co-evaluation                 AM-PAC OT "6 Clicks" Daily Activity     Outcome Measure   Help from another person eating meals?: A Little Help from another person taking care of personal grooming?: A Little Help from another person toileting, which includes using toliet, bedpan, or urinal?: A Lot Help from another person bathing (including washing, rinsing, drying)?: A Lot Help from another person to put on and taking off regular upper body clothing?: A Lot Help from another person to put on and taking off regular lower body clothing?: A Lot 6 Click Score: 14    End of Session Equipment Utilized During Treatment: Rolling walker (2 wheels);Oxygen  OT Visit Diagnosis: Unsteadiness on feet (R26.81)   Activity Tolerance Patient tolerated treatment well   Patient Left in bed;with call bell/phone within reach   Nurse Communication Mobility status        Time: 9485-4627 OT Time Calculation (min): 23 min  Charges: OT General Charges $OT Visit: 1 Visit OT Treatments $Self Care/Home Management : 23-37 mins  01/29/2022  RP, OTR/L  Acute Rehabilitation Services  Office:  [phone]   Metta Clines 01/29/2022, 1:53 PM

## 2022-01-29 NOTE — Progress Notes (Signed)
PROGRESS NOTE  [name]  DOB: 10-15-1965  PCP: Jacklynn Ganong, MD SKA:[id number]  DOA: 01/16/2022  LOS: 62 days  Hospital Day: 14  Brief narrative: [name] is a 56 y.o. female with PMH significant for DM2, HTN, HLD, morbid obesity, OSA, CKD 5, CAD/CABG, chronic diastolic CHF.  Patient was recently hospitalized 9/27 to 10/17 for right foot diabetic ulcer for which podiatry and vascular surgery were consulted. Treated with antibiotics.  Hospitalization was complicated by development of generalized rash likely due to hypotension antibiotics.  Renal function worsened as well and hence patient needed dialysis.  Her renal function improved and she was discharged off dialysis with a creatinine of 2.5 on 10/17. 10/27, patient presented to OSH with altered mental status, lethargy, weakness.  Creatinine was elevated to 7.7, potassium elevated to 6.  Chest x-ray showed pulmonary edema.  Patient was given IV calcium gluconate, IV Lasix, insulin, dextrose and transferred to [facility]. Admitted to hospitalist service. Nephrology was consulted.  Patient was reinitiated on dialysis.   Subjective:   Patient in bed, appears comfortable, denies any headache, no fever, no chest pain or pressure, no shortness of breath , no abdominal pain. No new focal weakness.  Assessment and plan:  AKI on CKD 5, New ESRD - Presented with significantly elevated creatinine to 7.7 and potassium to 6.  Nephrology consulted. 10/28/2, tunnel catheter placed by IR.  Last dialysis this morning. Needs vein mapping for permanent access.  Vascular surgery to see. Patient has poor insight to her issues -high risk of noncompliance.  Now agreeable for SNF placement and for HD follow-up outpatient.  Acute on chronic heart failure with preserved ejection fraction (HFpEF), Hypotension , History of hypertension - Had volume overload at the time of admission.  Fluid removal through HD.  Midodrine for blood pressure support.  Chronic  heart medications which are Coreg, Demadex and Imdur on hold due to hypotension.    Fever early morning 01/24/2022.  Chest x-ray stable, had a indwelling Foley which was removed, blood cultures drawn, remain negative.  Appears nontoxic.  Monitor closely.  Fevers have completely resolved.  Encephalopathy - due to uremia completely resolved.  Severe anxiety and bilateral lower extremity pain.  Added yesterday Klonopin, as needed Ativan for breakthrough anxiety, add Neurontin along with pain medications adjusted further.   Anemia of chronic disease - Hemoglobin stable around 9.  No acute issue.   Morbid obesity - -Body mass index is 48.62 kg/m. Patient has been advised to make an attempt to improve diet and exercise patterns to aid in weight loss.  OSA (obstructive sleep apnea)  - Continue CPAP nightly.   Right foot diabetic ulcer, 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 - Continue local wound care. Per wound care instructions, daily cleanse, pat dry and application of moisturizer (Eucerin cream) RLE wound will be covered with a folded layer of xeroform gauze topped with dry gauze. Both LEs are to be wrapped with Kerlix roll gauze and secured with paper tape. Feet are to be placed into Prevalon boots. A sacral silicone foam is to be placed for PI prevention.  On oral and IV pain medications, question some narcotic seeking behavior now, monitor cautiously.  During her last admission she was seen by Dr. Stanford Breed vascular surgeon and Dr. Loel Lofty podiatrist, she was again seen by podiatry this admission.  Continue supportive care.   Rash with antibiotics - skin flaking off. Moisturizers   Foley catheter placement upon admission- Inserted at  outside hospital prior to admission on 01/16/22 . Discussed with nephrology.  Foley has been removed on 01/24/2022.   Generalized body ache - Most likely in the setting of uremia. Improved with supportive Rx.   Type 2 diabetes  mellitus with diabetic peripheral neuropathy- A1c 6.9 on 10/25/2021, PTA on Basaglar.  Currently on hold. Currently blood sugars controlled on sliding scale insulin Accu-Cheks. Gabapentin for neuropathy, currently on hold.  CBG (last 3)  Recent Labs    01/29/22 0002 01/29/22 0342 01/29/22 0820  GLUCAP 155* 169* 135*      Goals of care   Code Status: Full Code    Mobility: Encourage ambulation  Skin assessment:  Pressure Injury 12/28/21 Buttocks Right Stage 2 -  Partial thickness loss of dermis presenting as a shallow open injury with a red, pink wound bed without slough. (Active)  12/28/21 2300  Location: Buttocks  Location Orientation: Right  Staging: Stage 2 -  Partial thickness loss of dermis presenting as a shallow open injury with a red, pink wound bed without slough.  Wound Description (Comments):   Present on Admission: -- (I'm not sure)     Pressure Injury 12/28/21 Left Stage 2 -  Partial thickness loss of dermis presenting as a shallow open injury with a red, pink wound bed without slough. (Active)  12/28/21 2300  Location:   Location Orientation: Left  Staging: Stage 2 -  Partial thickness loss of dermis presenting as a shallow open injury with a red, pink wound bed without slough.  Wound Description (Comments):   Present on Admission: -- (I'm not sure)    Nutritional status:  Body mass index is 48.62 kg/m.       Active Pressure Injury/Wound(s)     Pressure Ulcer  Duration          Pressure Injury 12/28/21 Buttocks Right Stage 2 -  Partial thickness loss of dermis presenting as a shallow open injury with a red, pink wound bed without slough. 26 days   Pressure Injury 12/28/21 Left Stage 2 -  Partial thickness loss of dermis presenting as a shallow open injury with a red, pink wound bed without slough. 26 days             Diet:  Diet Order             Diet regular Room service appropriate? Yes; Fluid consistency: Thin  Diet effective now                    DVT prophylaxis:  heparin injection 5,000 Units Start: 01/17/22 0600    Fluid: None Consultants: Nephrology Family Communication: None at bedside  Status is: Inpatient  Continue in-hospital care because: Outpatient dialysis chair arrangement Level of care: Progressive   Dispo: The patient is from: Home              Anticipated d/c is to: SNF recommended by PT              Patient currently is not medically stable to d/c.   Difficult to place patient No  Scheduled Meds:  Chlorhexidine Gluconate Cloth  6 each Topical Q0600   clonazepam  0.5 mg Oral QHS   cyclobenzaprine  5 mg Oral TID   darbepoetin (ARANESP) injection - NON-DIALYSIS  40 mcg Subcutaneous Q Sun-1800   escitalopram  10 mg Oral Daily   gabapentin  200 mg Oral BID   heparin  5,000 Units Subcutaneous Q8H   hydrocerin   Topical  Daily   insulin aspart  0-9 Units Subcutaneous Q4H   metoprolol tartrate  25 mg Oral BID   midodrine  10 mg Oral TID WC   sodium chloride  2 spray Each Nare TID AC & HS   traMADol  50 mg Oral Once    PRN meds: acetaminophen **OR** acetaminophen, HYDROmorphone (DILAUDID) injection, LORazepam, ondansetron **OR** ondansetron (ZOFRAN) IV, mouth rinse, oxyCODONE, oxymetazoline, polyethylene glycol   Objective: Vitals:   01/29/22 0338 01/29/22 0828  BP: (!) 111/59 (!) 133/51  Pulse: 86 89  Resp: 18 16  Temp: 98.2 F (36.8 C)   SpO2: 100%     Intake/Output Summary (Last 24 hours) at 01/29/2022 1042 Last data filed at 01/28/2022 1700 Gross per 24 hour  Intake 480 ml  Output 3000 ml  Net -2520 ml   Filed Weights   01/26/22 2150 01/28/22 0800 01/28/22 1218  Weight: (!) 142.7 kg (!) 143.8 kg (!) 140.8 kg   Weight change:  Body mass index is 48.62 kg/m.   Physical Exam:  Awake Alert, No new F.N deficits, Anxious affect Gillis.AT,PERRAL Supple Neck, No JVD,   Symmetrical Chest wall movement, Good air movement bilaterally, CTAB RRR,No Gallops, Rubs or new Murmurs,   +ve B.Sounds, Abd Soft, No tenderness,   2+ lower extremity edema, bilateral Unna boots, chronic lichenification in her skin mostly in the lower extremities, right IJ HD catheter in place site clean.   Data Review: I have personally reviewed the laboratory data and studies available.  Recent Labs  Lab 01/25/22 0629 01/26/22 0421 01/27/22 0435 01/28/22 0639 01/29/22 0420  WBC 13.7* 13.2* 13.1* 9.9 8.5  HGB 8.7* 8.2* 9.0* 8.7* 8.7*  HCT 27.5* 25.7* 28.8* 26.9* 28.6*  PLT 160 171 178 160 141*  MCV 86.2 86.0 86.7 85.4 89.7  MCH 27.3 27.4 27.1 27.6 27.3  MCHC 31.6 31.9 31.3 32.3 30.4  RDW 16.7* 16.7* 17.2* 17.8* 18.0*  LYMPHSABS 4.0 3.8 4.3* 2.5 3.0  MONOABS 1.2* 1.1* 1.0 0.8 0.8  EOSABS 0.1 0.1 0.2 0.2 0.2  BASOSABS 0.1 0.1 0.1 0.1 0.1    Recent Labs  Lab 01/24/22 0614 01/25/22 0629 01/26/22 0421 01/27/22 0435 01/28/22 0639 01/29/22 0420  NA  --  132* 134* 133* 132* 133*  K  --  3.7 3.9 3.8 3.8 3.6  CL  --  95* 95* 95* 94* 96*  CO2  --  '23 25 24 26 27  '$ GLUCOSE  --  160* 121* 124* 145* 148*  BUN  --  44* 52* 36* 43* 27*  CREATININE  --  3.25* 3.66* 3.07* 3.96* 3.28*  CALCIUM  --  8.7* 8.9 8.9 8.9 8.9  ALBUMIN  --  2.4* 2.3* 2.8* 2.5* 2.5*  PHOS  --  3.5 3.9 3.2 3.4 2.9  CRP 9.8* 16.0* 15.6* 12.6* 10.0*  --   PROCALCITON 0.32 0.52 0.82 0.90 0.91  --   BNP >4,500.0* 4,121.6* >4,500.0* >4,500.0* 3,263.3*  --      Signature  Lala Lund M.D on 01/29/2022 at 10:42 AM   -  To page go to [email or web address]

## 2022-01-29 NOTE — Progress Notes (Signed)
Physical Therapy Treatment Patient Details Name: [name] MRN: [id number] DOB: 27-Nov-1965 Today's Date: 01/29/2022   History of Present Illness [name] is a 56 y.o. female with PMH significant for DM2, HTN, HLD, morbid obesity, OSA, CKD 5, CAD/CABG, chronic diastolic CHF. She was previously admitted 9/27-10/17 with R foot ulcer, generalized rash and renal failure initiated HD, but with renal recovery.  Now admitted due to AMS, weakness, elevated creatinine and potassium, reinitiated dialysis.    PT Comments    Pt continue to be slow to progress. Needs extended time to perform and doesn't want to be "pushed." During our session pt stated she was agreeable with SNF but asking about where facilities were.    Recommendations for follow up therapy are one component of a multi-disciplinary discharge planning process, led by the attending physician.  Recommendations may be updated based on patient status, additional functional criteria and insurance authorization.  Follow Up Recommendations  Skilled nursing-short term rehab (<3 hours/day) Can patient physically be transported by private vehicle: Yes   Assistance Recommended at Discharge Frequent or constant Supervision/Assistance  Patient can return home with the following Two people to help with walking and/or transfers;Two people to help with bathing/dressing/bathroom;Assistance with cooking/housework;Help with stairs or ramp for entrance;Assist for transportation   Equipment Recommendations  None recommended by PT    Recommendations for Other Services       Precautions / Restrictions Precautions Precautions: Fall Restrictions Weight Bearing Restrictions: No     Mobility  Bed Mobility Overal bed mobility: Needs Assistance Bed Mobility: Supine to Sit     Supine to sit: Mod assist     General bed mobility comments: Assist to finish elevating trunk into sitting and bring hips to EOB    Transfers Overall transfer  level: Needs assistance Equipment used: Rolling walker (2 wheels) Transfers: Sit to/from Stand Sit to Stand: Mod assist, +2 physical assistance           General transfer comment: Assist to power up from bed.    Ambulation/Gait Ambulation/Gait assistance: Min assist, +2 safety/equipment Gait Distance (Feet): 5 Feet Assistive device: Rolling walker (2 wheels) Gait Pattern/deviations: Step-to pattern, Decreased step length - right, Decreased step length - left, Shuffle, Wide base of support Gait velocity: decr Gait velocity interpretation: <1.31 ft/sec, indicative of household ambulator   General Gait Details: Assist for balance and support.   Stairs             Wheelchair Mobility    Modified Rankin (Stroke Patients Only)       Balance Overall balance assessment: Needs assistance Sitting-balance support: No upper extremity supported, Feet supported Sitting balance-Leahy Scale: Good     Standing balance support: Bilateral upper extremity supported, During functional activity, Reliant on assistive device for balance Standing balance-Leahy Scale: Poor Standing balance comment: walker and min assist for static standing                            Cognition Arousal/Alertness: Awake/alert Behavior During Therapy: WFL for tasks assessed/performed Overall Cognitive Status: Within Functional Limits for tasks assessed                                 General Comments: Pt with low health literacy. Pt acknowledges that she needs to go to SNF prior to return home.        Exercises  General Comments General comments (skin integrity, edema, etc.): VSS on 2L      Pertinent Vitals/Pain Pain Assessment Faces Pain Scale: Hurts little more Pain Location: BLE to handling Pain Descriptors / Indicators: Grimacing    Home Living                          Prior Function            PT Goals (current goals can now be found in the  care plan section) Acute Rehab PT Goals Patient Stated Goal: to go home Progress towards PT goals: Progressing toward goals    Frequency    Min 3X/week      PT Plan Current plan remains appropriate    Co-evaluation              AM-PAC PT "6 Clicks" Mobility   Outcome Measure  Help needed turning from your back to your side while in a flat bed without using bedrails?: A Lot Help needed moving from lying on your back to sitting on the side of a flat bed without using bedrails?: A Lot Help needed moving to and from a bed to a chair (including a wheelchair)?: Total Help needed standing up from a chair using your arms (e.g., wheelchair or bedside chair)?: Total Help needed to walk in hospital room?: Total Help needed climbing 3-5 steps with a railing? : Total 6 Click Score: 8    End of Session Equipment Utilized During Treatment: Gait belt Activity Tolerance: Patient limited by fatigue Patient left: with call bell/phone within reach;in chair;with chair alarm set   PT Visit Diagnosis: Other abnormalities of gait and mobility (R26.89)     Time: 0929-1000 PT Time Calculation (min) (ACUTE ONLY): 31 min  Charges:  $Gait Training: 23-37 mins                     [place] [facility] [place] 01/29/2022, 12:26 PM

## 2022-01-29 NOTE — Progress Notes (Signed)
[facility] Progress Note     Assessment/ Plan:   # New ESRD - AKI on CKD stage IV/V now with progression to ESRD.  Previously palliative care involved and she did ulimately start HD last admission - per charting she just had hoped she would never need it and then they hoped had she enough renal function to come off.  First HD on 10/28 after tunn catheter with IR   - HD on 10/30 HD #2, HD #3 11/1 - MWF schedule   Plan next HD tomorrow 1st shift in case she's d'c.  -  CLIP'd renal navigator confirmed she's accepted at [facility].  - needs perm access- appreciate VVS consult ->  vein mapping and outpt perm access to allow her to process. She has panic attacks and per pt being pressured makes her panic so will slowly and gently educate + encourage.  - she has very poor insight into her condition--> I was hoping her brother, who is on dialysis in 25 and who has been present for conversations related to her ESRD, can be a resource for her.  But she asked 11/9 that I stop asking about the brother. I am concerned that she will be poorly adherent to her dialysis and then will have a bad outcome. Very poor understanding of ESRD; she understands she will have to go TIW and was concerned with the impact on her life.    # Hyperkalemia - resolved   # Confusion/encephalopathy - Concerning for uremia in the setting of CKD progression - now on HD - improved   # Hypotension - Hx of HTN - avoid hypotension - would hold imdur and coreg. Still on the low side. - Continue midodrine 5 mg TID (new med this admit)   # Chronic diastolic CHF  - holding imdur and coreg   - optimize volume status with HD    # Anemia CKD  - iron deficiency - repleted earlier in the month per charting - Started aranesp 40 mcg weekly on Sundays for now (last given 11/5)   # Renal osteodystrophy - No binder necessary; phos 3.2  #Fever: - no fevers in the last 48 hrs - new HD cath - Foley  d/c'd     Disposition - possibly SNF?  Subjective:   NAD. Denies f/c/n/v/sob.   c/o pain and not getting meds quickly enough   Objective:   BP (!) 133/51 (BP Location: Left Arm)   Pulse 89   Temp 98.2 F (36.8 C) (Oral)   Resp 16   Ht '5\' 7"'$  (1.702 m)   Wt (!) 140.8 kg   SpO2 100%   BMI 48.62 kg/m   Intake/Output Summary (Last 24 hours) at 01/29/2022 1104 Last data filed at 01/28/2022 1700 Gross per 24 hour  Intake 480 ml  Output 3000 ml  Net -2520 ml   Weight change:   Physical Exam: Gen: Sitting in a chair, pleasant CVS: RRR Resp: clear Abd: soft Ext: 1+ LE edema ACCESS: R IJ TC  Imaging: No results found.  Labs: BMET Recent Labs  Lab 01/23/22 0244 01/24/22 0351 01/25/22 0629 01/26/22 0421 01/27/22 0435 01/28/22 0639 01/29/22 0420  NA 136 133* 132* 134* 133* 132* 133*  K 3.3* 3.9 3.7 3.9 3.8 3.8 3.6  CL 99 99 95* 95* 95* 94* 96*  CO2 '26 25 23 25 24 26 27  '$ GLUCOSE 127* 169* 160* 121* 124* 145* 148*  BUN 46* 37* 44* 52* 36* 43* 27*  CREATININE 3.35* 2.80* 3.25* 3.66* 3.07* 3.96* 3.28*  CALCIUM 8.5* 8.6* 8.7* 8.9 8.9 8.9 8.9  PHOS 2.9 3.0 3.5 3.9 3.2 3.4 2.9   CBC Recent Labs  Lab 01/26/22 0421 01/27/22 0435 01/28/22 0639 01/29/22 0420  WBC 13.2* 13.1* 9.9 8.5  NEUTROABS 8.0* 7.5 6.3 4.5  HGB 8.2* 9.0* 8.7* 8.7*  HCT 25.7* 28.8* 26.9* 28.6*  MCV 86.0 86.7 85.4 89.7  PLT 171 178 160 141*    Medications:     Chlorhexidine Gluconate Cloth  6 each Topical Q0600   clonazepam  0.5 mg Oral QHS   cyclobenzaprine  5 mg Oral TID   darbepoetin (ARANESP) injection - NON-DIALYSIS  40 mcg Subcutaneous Q Sun-1800   escitalopram  10 mg Oral Daily   gabapentin  200 mg Oral BID   heparin  5,000 Units Subcutaneous Q8H   hydrocerin   Topical Daily   insulin aspart  0-9 Units Subcutaneous Q4H   metoprolol tartrate  25 mg Oral BID   midodrine  10 mg Oral TID WC   sodium chloride  2 spray Each Nare TID AC & HS   traMADol  50 mg Oral Once      Otelia Santee, MD 01/29/2022, 11:04 AM

## 2022-01-29 NOTE — TOC Progression Note (Signed)
Transition of Care [facility]) - Progression Note    Patient Details  Name: [name] MRN: [id number] Date of Birth: 1966/02/11  Transition of Care [facility]) CM/SW [place], [place] Work Phone Number: 01/29/2022, 11:19 AM  Clinical Narrative:    CSW and MSW intern spoke with patient at bedside along with Dr. Candiss Norse to discuss discharge plan to possible SNF. Patient stated she was still unsure about whether or not she wanted to go to SNF and asked for the locations. Dr. Candiss Norse encouraged patient to try SNF for the rehabilitation they offer and advised he would speak with patient in the morning to see how close to discharge patient was and thanked patient.  CSW and MSW intern then spoke with patient about the different facilities that have extended a bed offer to patient. Patient stated she was still unsure if she wanted to go to SNF at this time and requested more time to speak with her family. CSW spoke with patient about the importance of making a decision to ensure a SNF will still have a bed if patient chooses SNF route. Patient became irritated with CSW and MSW intern and advised her children are still at work right now and she would like additional time to think. Patient repeatedly stated the decision needs to be hers and she feels as if no one is allowing her the time to make the decision. MSW intern then asked if there was any additional information they could provide to help patient when she spoke with her family. Patient stated she was familiar with the areas of these facilities but was not sure of the street names. MSW intern then asked if printing a map of the locations with landmarks or buildings that are near them is helpful. Patient agreed to this. CSW and MSW intern will provide information to patient this afternoon.    Expected Discharge Plan: [place] Barriers to Discharge: Continued Medical Work up  Expected Discharge Plan and Services Expected  Discharge Plan: [place] arrangements for the past 2 months: Single Family Home                                       Social Determinants of Health (SDOH) Interventions    Readmission Risk Interventions    12/18/2021    2:02 PM 11/05/2021    1:58 PM  Readmission Risk Prevention Plan  Transportation Screening Complete Complete  PCP or Specialist Appt within 5-7 Days  Complete  PCP or Specialist Appt within 3-5 Days Complete   Home Care Screening  Complete  Medication Review (RN CM)  Complete  HRI or Home Care Consult Complete   Social Work Consult for [place] Planning/Counseling Complete   Palliative Care Screening Not Applicable   Medication Review Press photographer) Complete

## 2022-01-30 DIAGNOSIS — N179 Acute kidney failure, unspecified: Secondary | ICD-10-CM | POA: Diagnosis not present

## 2022-01-30 DIAGNOSIS — N184 Chronic kidney disease, stage 4 (severe): Secondary | ICD-10-CM | POA: Diagnosis not present

## 2022-01-30 LAB — RENAL FUNCTION PANEL
Albumin: 2.7 g/dL — ABNORMAL LOW (ref 3.5–5.0)
Anion gap: 13 (ref 5–15)
BUN: 31 mg/dL — ABNORMAL HIGH (ref 6–20)
CO2: 22 mmol/L (ref 22–32)
Calcium: 9.2 mg/dL (ref 8.9–10.3)
Chloride: 95 mmol/L — ABNORMAL LOW (ref 98–111)
Creatinine, Ser: 3.89 mg/dL — ABNORMAL HIGH (ref 0.44–1.00)
GFR, Estimated: 13 mL/min — ABNORMAL LOW (ref >60)
Glucose, Bld: 175 mg/dL — ABNORMAL HIGH (ref 70–99)
Phosphorus: 3.4 mg/dL (ref 2.5–4.6)
Potassium: 4 mmol/L (ref 3.5–5.1)
Sodium: 130 mmol/L — ABNORMAL LOW (ref 135–145)

## 2022-01-30 LAB — CBC
HCT: 25.7 % — ABNORMAL LOW (ref 36.0–46.0)
Hemoglobin: 8.2 g/dL — ABNORMAL LOW (ref 12.0–15.0)
MCH: 28 pg (ref 26.0–34.0)
MCHC: 31.9 g/dL (ref 30.0–36.0)
MCV: 87.7 fL (ref 80.0–100.0)
Platelets: 145 10*3/uL — ABNORMAL LOW (ref 150–400)
RBC: 2.93 MIL/uL — ABNORMAL LOW (ref 3.87–5.11)
RDW: 18.1 % — ABNORMAL HIGH (ref 11.5–15.5)
WBC: 9.7 10*3/uL (ref 4.0–10.5)
nRBC: 0.2 % (ref 0.0–0.2)

## 2022-01-30 LAB — GLUCOSE, CAPILLARY
Glucose-Capillary: 244 mg/dL — ABNORMAL HIGH (ref 70–99)
Glucose-Capillary: 109 mg/dL — ABNORMAL HIGH (ref 70–99)
Glucose-Capillary: 127 mg/dL — ABNORMAL HIGH (ref 70–99)
Glucose-Capillary: 177 mg/dL — ABNORMAL HIGH (ref 70–99)
Glucose-Capillary: 157 mg/dL — ABNORMAL HIGH (ref 70–99)

## 2022-01-30 MED ORDER — ALTEPLASE 2 MG IJ SOLR: 2.0000 mg | Freq: Once | INTRAMUSCULAR | Status: DC | PRN

## 2022-01-30 MED ORDER — ALBUMIN HUMAN 25 % IV SOLN
25.0000 g | INTRAVENOUS | Status: AC | PRN
  Administered 2022-01-30 (×2): 25 g via INTRAVENOUS
  Filled 2022-01-30 (×2): qty 100

## 2022-01-30 MED ORDER — ANTICOAGULANT SODIUM CITRATE 4% (200MG/5ML) IV SOLN: 5.0000 mL | Status: DC | PRN

## 2022-01-30 MED ORDER — HEPARIN SODIUM (PORCINE) 1000 UNIT/ML DIALYSIS
1000.0000 [IU] | INTRAMUSCULAR | Status: DC | PRN
  Administered 2022-01-30: 1000 [IU]
  Filled 2022-01-30: qty 1

## 2022-01-30 NOTE — Progress Notes (Signed)
Received patient in bed to unit.  Alert and oriented.  Informed consent signed and in chart.   Treatment initiated: 0808 Treatment completed: 1216  Patient tolerated well.  Transported back to the room  Alert, without acute distress.  Hand-off given to patient's nurse.   Access used: HD catheter Access issues: NOne  Total UF removed: 493m Medication(s) given: Albumin 50g IV  Post HD VS: 97.8, 104/53, 78, 11, 100% on 3L [place] HD weight: 139.5kg   [facility]

## 2022-01-30 NOTE — Progress Notes (Signed)
PT Cancellation Note  Patient Details Name: [name] MRN: [id number] DOB: March 08, 1966   Cancelled Treatment:    Reason Eval/Treat Not Completed: Patient at procedure or test/unavailable. Pt in HD.   [name] [facility] 01/30/2022, 8:35 AM [place] Office [phone]

## 2022-01-30 NOTE — Plan of Care (Signed)

## 2022-01-30 NOTE — Progress Notes (Signed)
PT Cancellation Note  Patient Details Name: [name] MRN: [id number] DOB: December 07, 1965   Cancelled Treatment:    Reason Eval/Treat Not Completed: Other (comment). Attempted 2 more times this PM. Pt reported that she didn't get to eat anything after HD before they took her tray away. I asked nurse about ordering a new tray and she reported she offered the pt the lunch tray as soon as she came back from HD and pt told her she didn't want it. I assisted pt ordering a new tray. Returned later and pt eating and deferred PT. Will continue attempts at later date.    [name] [facility] 01/30/2022, 4:33 PM [place] Office [phone]

## 2022-01-30 NOTE — Progress Notes (Signed)
PROGRESS NOTE  [name]  DOB: [birth year]  PCP: Jacklynn Ganong, MD NTZ:[id number]  DOA: 01/16/2022  LOS: 52 days  Hospital Day: 15  Brief narrative: [name] is a 56 y.o. female with PMH significant for DM2, HTN, HLD, morbid obesity, OSA, CKD 5, CAD/CABG, chronic diastolic CHF.  Patient was recently hospitalized 9/27 to 10/17 for right foot diabetic ulcer for which podiatry and vascular surgery were consulted. Treated with antibiotics.  Hospitalization was complicated by development of generalized rash likely due to hypotension antibiotics.  Renal function worsened as well and hence patient needed dialysis.  Her renal function improved and she was discharged off dialysis with a creatinine of 2.5 on 10/17. 10/27, patient presented to OSH with altered mental status, lethargy, weakness.  Creatinine was elevated to 7.7, potassium elevated to 6.  Chest x-ray showed pulmonary edema.  Patient was given IV calcium gluconate, IV Lasix, insulin, dextrose and transferred to [facility]. Admitted to hospitalist service. Nephrology was consulted.  Patient was reinitiated on dialysis.   Subjective: Patient in bed, appears comfortable, denies any headache, no fever, no chest pain or pressure, no shortness of breath , no abdominal pain. No focal weakness.  Assessment and plan:  AKI on CKD 5, New ESRD - Presented with significantly elevated creatinine to 7.7 and potassium to 6.  Nephrology consulted. 10/28/2, tunnel catheter placed by IR.  Last dialysis this morning. Needs vein mapping for permanent access.  Vascular surgery to see. Patient has poor insight to her issues -high risk of noncompliance.  Now agreeable for SNF placement and for HD follow-up outpatient.  Acute on chronic heart failure with preserved ejection fraction (HFpEF), Hypotension , History of hypertension - Had volume overload at the time of admission.  Fluid removal through HD.  Midodrine for blood pressure support.  Chronic heart  medications which are Coreg, Demadex and Imdur on hold due to hypotension.    Fever early morning 01/24/2022.  Chest x-ray stable, had a indwelling Foley which was removed, blood cultures drawn, remain negative.  Appears nontoxic.  Monitor closely.  Fevers have completely resolved.  Encephalopathy - due to uremia completely resolved.  Severe anxiety and bilateral lower extremity pain.  Added yesterday Klonopin, as needed Ativan for breakthrough anxiety, add Neurontin along with pain medications adjusted further.   Anemia of chronic disease - Hemoglobin stable around 9.  No acute issue.   Morbid obesity - -Body mass index is 48.62 kg/m. Patient has been advised to make an attempt to improve diet and exercise patterns to aid in weight loss.  OSA (obstructive sleep apnea)  - Continue CPAP nightly.   Right foot diabetic ulcer, 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 - Continue local wound care. Per wound care instructions, daily cleanse, pat dry and application of moisturizer (Eucerin cream) RLE wound will be covered with a folded layer of xeroform gauze topped with dry gauze. Both LEs are to be wrapped with Kerlix roll gauze and secured with paper tape. Feet are to be placed into Prevalon boots. A sacral silicone foam is to be placed for PI prevention.  On oral and IV pain medications, question some narcotic seeking behavior now, monitor cautiously.  During her last admission she was seen by Dr. Stanford Breed vascular surgeon and Dr. Loel Lofty podiatrist, she was again seen by podiatry this admission.  Continue supportive care.   Rash with antibiotics - skin flaking off. Moisturizers   Foley catheter placement upon admission- Inserted at outside hospital prior  to admission on 01/16/22 . Discussed with nephrology.  Foley has been removed on 01/24/2022.   Generalized body ache - Most likely in the setting of uremia. Improved with supportive Rx.   Type 2 diabetes  mellitus with diabetic peripheral neuropathy- A1c 6.9 on 10/25/2021, PTA on Basaglar.  Currently on hold. Currently blood sugars controlled on sliding scale insulin Accu-Cheks. Gabapentin for neuropathy, currently on hold.  CBG (last 3)  Recent Labs    01/29/22 2109 01/29/22 2332 01/30/22 0348  GLUCAP 137* 176* 244*      Goals of care   Code Status: Full Code    Mobility: Encourage ambulation  Skin assessment:  Pressure Injury 12/28/21 Buttocks Right Stage 2 -  Partial thickness loss of dermis presenting as a shallow open injury with a red, pink wound bed without slough. (Active)  12/28/21 2300  Location: Buttocks  Location Orientation: Right  Staging: Stage 2 -  Partial thickness loss of dermis presenting as a shallow open injury with a red, pink wound bed without slough.  Wound Description (Comments):   Present on Admission: -- (I'm not sure)     Pressure Injury 12/28/21 Left Stage 2 -  Partial thickness loss of dermis presenting as a shallow open injury with a red, pink wound bed without slough. (Active)  12/28/21 2300  Location:   Location Orientation: Left  Staging: Stage 2 -  Partial thickness loss of dermis presenting as a shallow open injury with a red, pink wound bed without slough.  Wound Description (Comments):   Present on Admission: -- (I'm not sure)    Nutritional status:  Body mass index is 48.62 kg/m.       Active Pressure Injury/Wound(s)     Pressure Ulcer  Duration          Pressure Injury 12/28/21 Buttocks Right Stage 2 -  Partial thickness loss of dermis presenting as a shallow open injury with a red, pink wound bed without slough. 26 days   Pressure Injury 12/28/21 Left Stage 2 -  Partial thickness loss of dermis presenting as a shallow open injury with a red, pink wound bed without slough. 26 days             Diet:  Diet Order             Diet regular Room service appropriate? Yes; Fluid consistency: Thin  Diet effective now                    DVT prophylaxis:  heparin injection 5,000 Units Start: 01/17/22 0600    Fluid: None Consultants: Nephrology Family Communication: None at bedside  Status is: Inpatient  Continue in-hospital care because: Outpatient dialysis chair arrangement Level of care: Progressive   Dispo: The patient is from: Home              Anticipated d/c is to: SNF recommended by PT              Patient currently is not medically stable to d/c.   Difficult to place patient No  Scheduled Meds:  Chlorhexidine Gluconate Cloth  6 each Topical Q0600   clonazepam  0.5 mg Oral QHS   cyclobenzaprine  5 mg Oral TID   darbepoetin (ARANESP) injection - NON-DIALYSIS  40 mcg Subcutaneous Q Sun-1800   escitalopram  10 mg Oral Daily   gabapentin  200 mg Oral BID   heparin  5,000 Units Subcutaneous Q8H   hydrocerin   Topical Daily  insulin aspart  0-9 Units Subcutaneous Q4H   metoprolol tartrate  25 mg Oral BID   midodrine  10 mg Oral TID WC   sodium chloride  2 spray Each Nare TID AC & HS    PRN meds: acetaminophen **OR** acetaminophen, alteplase, anticoagulant sodium citrate, heparin, HYDROmorphone (DILAUDID) injection, hydrOXYzine, LORazepam, ondansetron **OR** ondansetron (ZOFRAN) IV, mouth rinse, oxyCODONE, oxymetazoline, polyethylene glycol   Objective: Vitals:   01/29/22 2100 01/30/22 0000  BP: 123/68 (!) 118/56  Pulse: 91 100  Resp: 16 16  Temp: 98.4 F (36.9 C) 98.1 F (36.7 C)  SpO2: 100% 97%   No intake or output data in the 24 hours ending 01/30/22 0825  Filed Weights   01/26/22 2150 01/28/22 0800 01/28/22 1218  Weight: (!) 142.7 kg (!) 143.8 kg (!) 140.8 kg   Weight change:  Body mass index is 48.62 kg/m.   Physical Exam:  Awake Alert, No new F.N deficits, going for HD [place].AT,PERRAL Supple Neck, No JVD,   Symmetrical Chest wall movement, Good air movement bilaterally, CTAB RRR,No Gallops, Rubs or new Murmurs,  +ve B.Sounds, Abd Soft, No tenderness,   2+ lower  extremity edema, bilateral [place]na boots, chronic lichenification in her skin mostly in the lower extremities, right IJ HD catheter in place site clean.   Data Review: I have personally reviewed the laboratory data and studies available.  Recent Labs  Lab 01/25/22 0629 01/26/22 0421 01/27/22 0435 01/28/22 0639 01/29/22 0420 01/30/22 0810  WBC 13.7* 13.2* 13.1* 9.9 8.5 9.7  HGB 8.7* 8.2* 9.0* 8.7* 8.7* 8.2*  HCT 27.5* 25.7* 28.8* 26.9* 28.6* 25.7*  PLT 160 171 178 160 141* 145*  MCV 86.2 86.0 86.7 85.4 89.7 87.7  MCH 27.3 27.4 27.1 27.6 27.3 28.0  MCHC 31.6 31.9 31.3 32.3 30.4 31.9  RDW 16.7* 16.7* 17.2* 17.8* 18.0* 18.1*  LYMPHSABS 4.0 3.8 4.3* 2.5 3.0  --   MONOABS 1.2* 1.1* 1.0 0.8 0.8  --   EOSABS 0.1 0.1 0.2 0.2 0.2  --   BASOSABS 0.1 0.1 0.1 0.1 0.1  --     Recent Labs  Lab 01/24/22 0614 01/25/22 0629 01/26/22 0421 01/27/22 0435 01/28/22 0639 01/29/22 0420 01/30/22 0248  NA  --  132* 134* 133* 132* 133* 130*  K  --  3.7 3.9 3.8 3.8 3.6 4.0  CL  --  95* 95* 95* 94* 96* 95*  CO2  --  '23 25 24 26 27 22  '$ GLUCOSE  --  160* 121* 124* 145* 148* 175*  BUN  --  44* 52* 36* 43* 27* 31*  CREATININE  --  3.25* 3.66* 3.07* 3.96* 3.28* 3.89*  CALCIUM  --  8.7* 8.9 8.9 8.9 8.9 9.2  ALBUMIN  --  2.4* 2.3* 2.8* 2.5* 2.5* 2.7*  PHOS  --  3.5 3.9 3.2 3.4 2.9 3.4  CRP 9.8* 16.0* 15.6* 12.6* 10.0*  --   --   PROCALCITON 0.32 0.52 0.82 0.90 0.91  --   --   BNP >4,500.0* 4,121.6* >4,500.0* >4,500.0* 3,263.3*  --   --      Signature  Lala Lund M.D on 01/30/2022 at 8:25 AM   -  To page go to [email or web address]

## 2022-01-30 NOTE — TOC Progression Note (Signed)
Transition of Care [facility]) - Progression Note    Patient Details  Name: [name] MRN: [id number] Date of Birth: 09/02/1965  Transition of Care [facility]) CM/SW [place], LCSW Phone Number: 01/30/2022, 5:36 PM  Clinical Narrative:    CSW and MSW intern met with patient again after dialysis for her SNF choice. She stated that she has not made one yet. CSW reminded her that she was going to discuss it with her family. She stated she has but did not decide on a facility. She searched for her glasses and CSW could not find them; alerted RN. Patient stated she was not ready to leave the hospital. CSW gently explained that per the MD she is medically stable to leave the hospital to which she stated he has not tole her that. CSW explained that he is only waiting on her to have a safe discharge plan in place. She stated that she knows her rights and she received a letter when she came in the hospital stating she didn't have to discharge if she did not feel like she was ready. CSW again explained the appeal process but that she would still have to make a SNF decision once the appeal results came back or she would be responsible for payment of the hospital bill. To this, she stated that if the hospital didn't want her here then she would leave. CSW again explained that there are multiple steps to SNF placement, including obtaining insurance authorization and changing her dialysis days to MWF for SNF to accommodate her. She stated she didn't want her dialysis days to be switched. CSW explained that SNF would not transport her on a TTS schedule. Patient tried to turn her phone on but was unable to do so. CSW offered to call her son on speaker phone. Patient asked, "Why?". CSW responded so that she could talk to him about SNF. She stated she will talk to him. CSW told her it may be more helpful if CSW was present in case her son had questions that patient could not answer. She denied this. She stated  perhaps she will try [facility] since she had not heard good things about [facility].   Dinner tray arrived and patient asked dietary to bring her cheese for her hamburger because she had changed her mind. Therapy came to work with her as well but patient told them she needed to eat.  CSW asked her to confirm that CSW can proceed with [facility] and insurance approval. She said that she would consider [facility] in case it has changed. CSW informed her that CSW would follow up with her on Saturday for her choice so that insurance can be started and prepared for Monday discharge. She reported agreement.  CSW confirmed that [facility] has a bed available Monday and can transport patient to dialysis MWF at either [facility] or [facility]. CSW informed renal navigator of need for MWF spot and she will follow up with the dialysis clinic.   TOC Supervisor updated.      Expected Discharge Plan: Skilled Nursing Facility Barriers to Discharge: Continued Medical Work up  Expected Discharge Plan and Services Expected Discharge Plan: [place] arrangements for the past 2 months: [place]  Social Determinants of Health (SDOH) Interventions    Readmission Risk Interventions    12/18/2021    2:02 PM 11/05/2021    1:58 PM  Readmission Risk Prevention Plan  Transportation Screening Complete Complete  PCP or Specialist Appt within 5-7 Days  Complete  PCP or Specialist Appt within 3-5 Days Complete   Home Care Screening  Complete  Medication Review (RN CM)  Complete  HRI or Home Care Consult Complete   Social Work Consult for [place] Planning/Counseling Complete   Palliative Care Screening Not Applicable   Medication Review Press photographer) Complete

## 2022-01-30 NOTE — Progress Notes (Signed)
Following pt's case to provide updates to out-pt HD clinic on pt's d/c date and plan once confirmed by pt. Pt has been approved for [facility] TTS 11:00 chair time. Will need to coordinate start date at clinic once pt's d/c date is known. Will assist as needed.   Melven Sartorius Renal Navigator [phone]

## 2022-01-30 NOTE — Progress Notes (Signed)
[facility] Progress Note     Assessment/ Plan:   # New ESRD - AKI on CKD stage IV/V now with progression to ESRD.  Previously palliative care involved and she did ulimately start HD last admission - per charting she just had hoped she would never need it and then they hoped had she enough renal function to come off.  First HD on 10/28 after tunn catheter with IR   - HD on 10/30 HD #2, HD #3 11/1 - MWF schedule   Seen on HD  3K bath RIJ TC 96/49 BP low and had to give Albumin Mentating but still very flat affect at baseline  -  CLIP'd renal navigator confirmed she's accepted at [facility] TTS.  - needs perm access- appreciate VVS consult ->  vein mapping and outpt perm access to allow her to process. She has panic attacks and per pt being pressured makes her panic so will slowly and gently educate + encourage.  - she has very poor insight into her condition--> I was hoping her brother, who is on dialysis in 38 and who has been present for conversations related to her ESRD, can be a resource for her.  But she asked 11/9 that I stop asking about the brother. I am concerned that she will be poorly adherent to her dialysis and then will have a bad outcome. Very poor understanding of ESRD; she understands she will have to go TIW and was concerned with the impact on her life.    # Hyperkalemia - resolved   # Confusion/encephalopathy - Concerning for uremia in the setting of CKD progression - now on HD - improved   # Hypotension - Hx of HTN - avoid hypotension - would hold imdur and coreg. Still on the low side. - Continue midodrine 5 mg TID (new med this admit)   # Chronic diastolic CHF  - holding imdur and coreg   - optimize volume status with HD    # Anemia CKD  - iron deficiency - repleted earlier in the month per charting - Started aranesp 40 mcg weekly on Sundays for now (last given 11/5)   # Renal osteodystrophy - No binder necessary; phos  3.2  #Fever: - no fevers in the last 48 hrs - new HD cath - Foley d/c'd     Disposition - possibly SNF?  Subjective:   NAD. Denies f/c/n/v/sob.   Doesn't feel like she's ready to leave the hospital.    Objective:   BP (!) 96/46 (BP Location: Left Wrist)   Pulse 86   Temp 98.4 F (36.9 C) (Oral)   Resp 17   Ht '5\' 7"'$  (1.702 m)   Wt (!) 140.5 kg   SpO2 100%   BMI 48.51 kg/m  No intake or output data in the 24 hours ending 01/30/22 1036  Weight change:   Physical Exam: Gen: Supine in bed, pleasant but very flat affect CVS: RRR Resp: clear Abd: soft Ext: 1+ LE edema ACCESS: R IJ TC  Imaging: No results found.  Labs: BMET Recent Labs  Lab 01/24/22 0351 01/25/22 0629 01/26/22 0421 01/27/22 0435 01/28/22 0639 01/29/22 0420 01/30/22 0248  NA 133* 132* 134* 133* 132* 133* 130*  K 3.9 3.7 3.9 3.8 3.8 3.6 4.0  CL 99 95* 95* 95* 94* 96* 95*  CO2 '25 23 25 24 26 27 22  '$ GLUCOSE 169* 160* 121* 124* 145* 148* 175*  BUN 37* 44* 52* 36* 43* 27* 31*  CREATININE 2.80* 3.25* 3.66* 3.07* 3.96* 3.28* 3.89*  CALCIUM 8.6* 8.7* 8.9 8.9 8.9 8.9 9.2  PHOS 3.0 3.5 3.9 3.2 3.4 2.9 3.4   CBC Recent Labs  Lab 01/26/22 0421 01/27/22 0435 01/28/22 0639 01/29/22 0420 01/30/22 0810  WBC 13.2* 13.1* 9.9 8.5 9.7  NEUTROABS 8.0* 7.5 6.3 4.5  --   HGB 8.2* 9.0* 8.7* 8.7* 8.2*  HCT 25.7* 28.8* 26.9* 28.6* 25.7*  MCV 86.0 86.7 85.4 89.7 87.7  PLT 171 178 160 141* 145*    Medications:     Chlorhexidine Gluconate Cloth  6 each Topical Q0600   clonazepam  0.5 mg Oral QHS   cyclobenzaprine  5 mg Oral TID   darbepoetin (ARANESP) injection - NON-DIALYSIS  40 mcg Subcutaneous Q Sun-1800   escitalopram  10 mg Oral Daily   gabapentin  200 mg Oral BID   heparin  5,000 Units Subcutaneous Q8H   hydrocerin   Topical Daily   insulin aspart  0-9 Units Subcutaneous Q4H   metoprolol tartrate  25 mg Oral BID   midodrine  10 mg Oral TID WC   sodium chloride  2 spray Each Nare TID AC & HS       Otelia Santee, MD 01/30/2022, 10:36 AM

## 2022-01-31 DIAGNOSIS — N179 Acute kidney failure, unspecified: Secondary | ICD-10-CM | POA: Diagnosis not present

## 2022-01-31 DIAGNOSIS — N184 Chronic kidney disease, stage 4 (severe): Secondary | ICD-10-CM | POA: Diagnosis not present

## 2022-01-31 LAB — RENAL FUNCTION PANEL
Albumin: 2.9 g/dL — ABNORMAL LOW (ref 3.5–5.0)
Anion gap: 11 (ref 5–15)
BUN: 19 mg/dL (ref 6–20)
CO2: 26 mmol/L (ref 22–32)
Calcium: 9 mg/dL (ref 8.9–10.3)
Chloride: 96 mmol/L — ABNORMAL LOW (ref 98–111)
Creatinine, Ser: 3.29 mg/dL — ABNORMAL HIGH (ref 0.44–1.00)
GFR, Estimated: 16 mL/min — ABNORMAL LOW (ref >60)
Glucose, Bld: 152 mg/dL — ABNORMAL HIGH (ref 70–99)
Phosphorus: 3 mg/dL (ref 2.5–4.6)
Potassium: 3.7 mmol/L (ref 3.5–5.1)
Sodium: 133 mmol/L — ABNORMAL LOW (ref 135–145)

## 2022-01-31 LAB — GLUCOSE, CAPILLARY
Glucose-Capillary: 152 mg/dL — ABNORMAL HIGH (ref 70–99)
Glucose-Capillary: 143 mg/dL — ABNORMAL HIGH (ref 70–99)
Glucose-Capillary: 141 mg/dL — ABNORMAL HIGH (ref 70–99)
Glucose-Capillary: 153 mg/dL — ABNORMAL HIGH (ref 70–99)
Glucose-Capillary: 158 mg/dL — ABNORMAL HIGH (ref 70–99)

## 2022-01-31 NOTE — Progress Notes (Signed)
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  Previously palliative care involved and she did ulimately start HD last admission - per charting she just had hoped she would never need it and then they hoped had she enough renal function to come off.  First HD on 10/28 after tunn catheter with IR   - HD on 10/30 HD #2, HD #3 11/1 - MWF schedule, tolerated HD on Fri.  Next HD Monday and will try to get her in a chair for hd; outpt center is going to want confirmation of this. I notified the pt and she will try; she has gotten out of bed to chair with assistance in the room but not clear if she will tolerate 4hrs in a chair.  -  CLIP'd renal navigator confirmed she's accepted at [facility].  - needs perm access- appreciate VVS consult ->  vein mapping and outpt perm access to allow her to process. She has panic attacks and per pt being pressured makes her panic so will slowly and gently educate + encourage.  - she has very poor insight into her condition--> I was hoping her brother, who is on dialysis in 36 and who has been present for conversations related to her ESRD, can be a resource for her.  But she asked 11/9 that I stop asking about the brother. I am concerned that she will be poorly adherent to her dialysis and then will have a bad outcome. Very poor understanding of ESRD; she understands she will have to go TIW and was concerned with the impact on her life.    # Hyperkalemia - resolved   # Confusion/encephalopathy - Concerning for uremia in the setting of CKD progression - now on HD - improved   # Hypotension - Hx of HTN - avoid hypotension - would hold imdur and coreg. Still on the low side. - Continue midodrine 5 mg TID (new med this admit)   # Chronic diastolic CHF  - holding imdur and coreg   - optimize volume status with HD    # Anemia CKD  - iron deficiency - repleted  earlier in the month per charting - Started aranesp 40 mcg weekly on Sundays for now (last given 11/5)   # Renal osteodystrophy - No binder necessary; phos 3.2  #Fever: - no fevers in the last 48 hrs - new HD cath - Foley d/c'd     Disposition - possibly SNF?  Subjective:   NAD. Denies f/c/n/v/sob.   Doesn't feel like she's ready to leave the hospital. Tolerated HD yest but BP on lower side.    Objective:   BP (!) 117/92 (BP Location: Right Arm)   Pulse 94   Temp 98.7 F (37.1 C) (Oral)   Resp 16   Ht '5\' 7"'$  (1.702 m)   Wt (!) 139.5 kg   SpO2 100%   BMI 48.17 kg/m   Intake/Output Summary (Last 24 hours) at 01/31/2022 0729 Last data filed at 01/30/2022 1216 Gross per 24 hour  Intake --  Output 0 ml  Net 0 ml    Weight change:   Physical Exam: Gen: Supine in bed, pleasant but very flat affect, easily arousable CVS: RRR Resp: clear Abd: soft Ext: 1+ LE edema ACCESS: R IJ TC  Imaging: No results found.  Labs: BMET Recent Labs  Lab 01/25/22 [phone] 01/26/22 0421 01/27/22  [phone] 01/28/22 0639 01/29/22 0420 01/30/22 0248 01/31/22 0200  NA 132* 134* 133* 132* 133* 130* 133*  K 3.7 3.9 3.8 3.8 3.6 4.0 3.7  CL 95* 95* 95* 94* 96* 95* 96*  CO2 '23 25 24 26 27 22 26  '$ GLUCOSE 160* 121* 124* 145* 148* 175* 152*  BUN 44* 52* 36* 43* 27* 31* 19  CREATININE 3.25* 3.66* 3.07* 3.96* 3.28* 3.89* 3.29*  CALCIUM 8.7* 8.9 8.9 8.9 8.9 9.2 9.0  PHOS 3.5 3.9 3.2 3.4 2.9 3.4 3.0   CBC Recent Labs  Lab 01/26/22 0421 01/27/22 0435 01/28/22 0639 01/29/22 0420 01/30/22 0810  WBC 13.2* 13.1* 9.9 8.5 9.7  NEUTROABS 8.0* 7.5 6.3 4.5  --   HGB 8.2* 9.0* 8.7* 8.7* 8.2*  HCT 25.7* 28.8* 26.9* 28.6* 25.7*  MCV 86.0 86.7 85.4 89.7 87.7  PLT 171 178 160 141* 145*    Medications:     Chlorhexidine Gluconate Cloth  6 each Topical Q0600   clonazepam  0.5 mg Oral QHS   cyclobenzaprine  5 mg Oral TID   darbepoetin (ARANESP) injection - NON-DIALYSIS  40 mcg Subcutaneous Q  Sun-1800   escitalopram  10 mg Oral Daily   gabapentin  200 mg Oral BID   heparin  5,000 Units Subcutaneous Q8H   hydrocerin   Topical Daily   insulin aspart  0-9 Units Subcutaneous Q4H   metoprolol tartrate  25 mg Oral BID   midodrine  10 mg Oral TID WC   sodium chloride  2 spray Each Nare TID AC & HS      Otelia Santee, MD 01/31/2022, 7:29 AM

## 2022-01-31 NOTE — Plan of Care (Signed)

## 2022-01-31 NOTE — TOC Progression Note (Signed)
Transition of Care [facility]) - Progression Note    Patient Details  Name: [name] MRN: [id number] Date of Birth: March 31, 1965  Transition of Care [facility]) CM/SW Contact  [address] [place], [place] Phone Number: 01/31/2022, 11:13 AM  Clinical Narrative:    This Education officer, museum met with patient at bedside to obtain bed choice. Patient chooses [facility].  Facility to be notified, Navi authorization started [id number]. Clinicals faxed to [phone].   Carlo Lorson, LCSW Transition of Care     Expected Discharge Plan: Skilled Nursing Facility Barriers to Discharge: Continued Medical Work up  Expected Discharge Plan and Services Expected Discharge Plan: [place] arrangements for the past 2 months: Single Family Home                                       Social Determinants of Health (SDOH) Interventions    Readmission Risk Interventions    12/18/2021    2:02 PM 11/05/2021    1:58 PM  Readmission Risk Prevention Plan  Transportation Screening Complete Complete  PCP or Specialist Appt within 5-7 Days  Complete  PCP or Specialist Appt within 3-5 Days Complete   Home Care Screening  Complete  Medication Review (RN CM)  Complete  HRI or Home Care Consult Complete   Social Work Consult for [place] Planning/Counseling Complete   Palliative Care Screening Not Applicable   Medication Review Press photographer) Complete

## 2022-01-31 NOTE — Progress Notes (Signed)
PROGRESS NOTE  [name]  DOB: Jun 30, 1965  PCP: Jacklynn Ganong, MD UTM:[id number]  DOA: 01/16/2022  LOS: 55 days  Hospital Day: 16  Brief narrative: [name] is a 56 y.o. female with PMH significant for DM2, HTN, HLD, morbid obesity, OSA, CKD 5, CAD/CABG, chronic diastolic CHF.  Patient was recently hospitalized 9/27 to 10/17 for right foot diabetic ulcer for which podiatry and vascular surgery were consulted. Treated with antibiotics.  Hospitalization was complicated by development of generalized rash likely due to hypotension antibiotics.  Renal function worsened as well and hence patient needed dialysis.  Her renal function improved and she was discharged off dialysis with a creatinine of 2.5 on 10/17. 10/27, patient presented to OSH with altered mental status, lethargy, weakness.  Creatinine was elevated to 7.7, potassium elevated to 6.  Chest x-ray showed pulmonary edema.  Patient was given IV calcium gluconate, IV Lasix, insulin, dextrose and transferred to [facility]. Admitted to hospitalist service. Nephrology was consulted.  Patient was reinitiated on dialysis.  She is now clinically stable await placement.   Subjective: Patient in bed, appears comfortable, denies any headache, no fever, no chest pain or pressure, no shortness of breath , no abdominal pain. No new focal weakness.   Assessment and plan:  AKI on CKD 5, New ESRD - Presented with significantly elevated creatinine to 7.7 and potassium to 6.  Nephrology consulted. 10/28/2, tunnel catheter placed by IR.  Last dialysis this morning. Needs vein mapping for permanent access.  Vascular surgery to see. Patient has poor insight to her issues -high risk of noncompliance.  Now agreeable for SNF placement and for HD follow-up outpatient.  Acute on chronic heart failure with preserved ejection fraction (HFpEF), Hypotension , History of hypertension - Had volume overload at the time of admission.  Fluid removal through HD.   Midodrine for blood pressure support.  Chronic heart medications which are Coreg, Demadex and Imdur on hold due to hypotension.    Fever early morning 01/24/2022.  Likely was due to indwelling Foley catheter resolved after Foley removal.  Encephalopathy - due to uremia completely resolved.  Severe anxiety and bilateral lower extremity pain.  Added yesterday Klonopin, as needed Ativan for breakthrough anxiety, add Neurontin along with pain medications adjusted further.   Anemia of chronic disease - Hemoglobin stable around 9.  No acute issue.   Morbid obesity - -Body mass index is 48.17 kg/m. Patient has been advised to make an attempt to improve diet and exercise patterns to aid in weight loss.  OSA (obstructive sleep apnea)  - Continue CPAP nightly.   Right foot diabetic ulcer, 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 - Continue local wound care. Per wound care instructions, daily cleanse, pat dry and application of moisturizer (Eucerin cream) RLE wound will be covered with a folded layer of xeroform gauze topped with dry gauze. Both LEs are to be wrapped with Kerlix roll gauze and secured with paper tape. Feet are to be placed into Prevalon boots. A sacral silicone foam is to be placed for PI prevention.  On oral and IV pain medication.  During her last admission she was seen by Dr. Stanford Breed vascular surgeon and Dr. Loel Lofty podiatrist, she was again seen by podiatry this admission.  Continue supportive care.   Rash with antibiotics - skin flaking off. Moisturizers   Foley catheter placement upon admission- Inserted at outside hospital prior to admission on 01/16/22 . Discussed with nephrology.  Foley has been removed  on 01/24/2022.   Generalized body ache - Most likely in the setting of uremia. Improved with supportive Rx.   Type 2 diabetes mellitus with diabetic peripheral neuropathy- A1c 6.9 on 10/25/2021, PTA on Basaglar.  Currently on hold. Currently  blood sugars controlled on sliding scale insulin Accu-Cheks. Gabapentin for neuropathy, currently on hold.  CBG (last 3)  Recent Labs    01/30/22 2258 01/31/22 0300 01/31/22 0807  GLUCAP 157* 152* 143*      Goals of care   Code Status: Full Code    Mobility: Encourage ambulation  Skin assessment:  Pressure Injury 12/28/21 Buttocks Right Stage 2 -  Partial thickness loss of dermis presenting as a shallow open injury with a red, pink wound bed without slough. (Active)  12/28/21 2300  Location: Buttocks  Location Orientation: Right  Staging: Stage 2 -  Partial thickness loss of dermis presenting as a shallow open injury with a red, pink wound bed without slough.  Wound Description (Comments):   Present on Admission: -- (I'm not sure)     Pressure Injury 12/28/21 Left Stage 2 -  Partial thickness loss of dermis presenting as a shallow open injury with a red, pink wound bed without slough. (Active)  12/28/21 2300  Location:   Location Orientation: Left  Staging: Stage 2 -  Partial thickness loss of dermis presenting as a shallow open injury with a red, pink wound bed without slough.  Wound Description (Comments):   Present on Admission: -- (I'm not sure)    Nutritional status:  Body mass index is 48.17 kg/m.       Active Pressure Injury/Wound(s)     Pressure Ulcer  Duration          Pressure Injury 12/28/21 Buttocks Right Stage 2 -  Partial thickness loss of dermis presenting as a shallow open injury with a red, pink wound bed without slough. 26 days   Pressure Injury 12/28/21 Left Stage 2 -  Partial thickness loss of dermis presenting as a shallow open injury with a red, pink wound bed without slough. 26 days             Diet:  Diet Order             Diet regular Room service appropriate? Yes; Fluid consistency: Thin  Diet effective now                   DVT prophylaxis:  heparin injection 5,000 Units Start: 01/17/22 0600    Fluid:  None Consultants: Nephrology Family Communication: None at bedside  Status is: Inpatient  Continue in-hospital care because: Outpatient dialysis chair arrangement Level of care: Progressive   She is now clinically stable await placement.    Scheduled Meds:  Chlorhexidine Gluconate Cloth  6 each Topical Q0600   clonazepam  0.5 mg Oral QHS   cyclobenzaprine  5 mg Oral TID   darbepoetin (ARANESP) injection - NON-DIALYSIS  40 mcg Subcutaneous Q Sun-1800   escitalopram  10 mg Oral Daily   gabapentin  200 mg Oral BID   heparin  5,000 Units Subcutaneous Q8H   hydrocerin   Topical Daily   insulin aspart  0-9 Units Subcutaneous Q4H   metoprolol tartrate  25 mg Oral BID   midodrine  10 mg Oral TID WC   sodium chloride  2 spray Each Nare TID AC & HS    PRN meds: acetaminophen **OR** acetaminophen, HYDROmorphone (DILAUDID) injection, hydrOXYzine, LORazepam, ondansetron **OR** ondansetron (ZOFRAN) IV, mouth rinse, oxyCODONE,  oxymetazoline, polyethylene glycol   Objective: Vitals:   01/31/22 0000 01/31/22 0809  BP: (!) 117/92 105/68  Pulse: 94 90  Resp: 16 14  Temp: 98.7 F (37.1 C) 98.9 F (37.2 C)  SpO2: 100% 100%    Intake/Output Summary (Last 24 hours) at 01/31/2022 0930 Last data filed at 01/30/2022 1216 Gross per 24 hour  Intake --  Output 0 ml  Net 0 ml    Filed Weights   01/28/22 1218 01/30/22 0756 01/30/22 1216  Weight: (!) 140.8 kg (!) 140.5 kg (!) 139.5 kg   Weight change:  Body mass index is 48.17 kg/m.   Physical Exam:  Awake Alert, No new F.N deficits, going for HD [place].AT,PERRAL Supple Neck, No JVD,   Symmetrical Chest wall movement, Good air movement bilaterally, CTAB RRR,No Gallops, Rubs or new Murmurs,  +ve B.Sounds, Abd Soft, No tenderness,   2+ lower extremity edema, bilateral Unna boots, chronic lichenification in her skin mostly in the lower extremities, right IJ HD catheter in place site clean.   Data Review: I have personally reviewed the  laboratory data and studies available.  Recent Labs  Lab 01/25/22 0629 01/26/22 0421 01/27/22 0435 01/28/22 0639 01/29/22 0420 01/30/22 0810  WBC 13.7* 13.2* 13.1* 9.9 8.5 9.7  HGB 8.7* 8.2* 9.0* 8.7* 8.7* 8.2*  HCT 27.5* 25.7* 28.8* 26.9* 28.6* 25.7*  PLT 160 171 178 160 141* 145*  MCV 86.2 86.0 86.7 85.4 89.7 87.7  MCH 27.3 27.4 27.1 27.6 27.3 28.0  MCHC 31.6 31.9 31.3 32.3 30.4 31.9  RDW 16.7* 16.7* 17.2* 17.8* 18.0* 18.1*  LYMPHSABS 4.0 3.8 4.3* 2.5 3.0  --   MONOABS 1.2* 1.1* 1.0 0.8 0.8  --   EOSABS 0.1 0.1 0.2 0.2 0.2  --   BASOSABS 0.1 0.1 0.1 0.1 0.1  --     Recent Labs  Lab 01/25/22 0629 01/26/22 0421 01/27/22 0435 01/28/22 0639 01/29/22 0420 01/30/22 0248 01/31/22 0200  NA 132* 134* 133* 132* 133* 130* 133*  K 3.7 3.9 3.8 3.8 3.6 4.0 3.7  CL 95* 95* 95* 94* 96* 95* 96*  CO2 '23 25 24 26 27 22 26  '$ GLUCOSE 160* 121* 124* 145* 148* 175* 152*  BUN 44* 52* 36* 43* 27* 31* 19  CREATININE 3.25* 3.66* 3.07* 3.96* 3.28* 3.89* 3.29*  CALCIUM 8.7* 8.9 8.9 8.9 8.9 9.2 9.0  ALBUMIN 2.4* 2.3* 2.8* 2.5* 2.5* 2.7* 2.9*  PHOS 3.5 3.9 3.2 3.4 2.9 3.4 3.0  CRP 16.0* 15.6* 12.6* 10.0*  --   --   --   PROCALCITON 0.52 0.82 0.90 0.91  --   --   --   BNP 4,121.6* >4,500.0* >4,500.0* 3,263.3*  --   --   --      Signature  Lala Lund M.D on 01/31/2022 at 9:30 AM   -  To page go to [email or web address]

## 2022-02-01 DIAGNOSIS — N179 Acute kidney failure, unspecified: Secondary | ICD-10-CM | POA: Diagnosis not present

## 2022-02-01 DIAGNOSIS — N184 Chronic kidney disease, stage 4 (severe): Secondary | ICD-10-CM | POA: Diagnosis not present

## 2022-02-01 LAB — CBC
HCT: 27.9 % — ABNORMAL LOW (ref 36.0–46.0)
Hemoglobin: 8.5 g/dL — ABNORMAL LOW (ref 12.0–15.0)
MCH: 26.8 pg (ref 26.0–34.0)
MCHC: 30.5 g/dL (ref 30.0–36.0)
MCV: 88 fL (ref 80.0–100.0)
Platelets: 141 10*3/uL — ABNORMAL LOW (ref 150–400)
RBC: 3.17 MIL/uL — ABNORMAL LOW (ref 3.87–5.11)
RDW: 17.7 % — ABNORMAL HIGH (ref 11.5–15.5)
WBC: 9.4 10*3/uL (ref 4.0–10.5)
nRBC: 0.3 % — ABNORMAL HIGH (ref 0.0–0.2)

## 2022-02-01 LAB — GLUCOSE, CAPILLARY
Glucose-Capillary: 109 mg/dL — ABNORMAL HIGH (ref 70–99)
Glucose-Capillary: 109 mg/dL — ABNORMAL HIGH (ref 70–99)
Glucose-Capillary: 116 mg/dL — ABNORMAL HIGH (ref 70–99)
Glucose-Capillary: 119 mg/dL — ABNORMAL HIGH (ref 70–99)
Glucose-Capillary: 123 mg/dL — ABNORMAL HIGH (ref 70–99)
Glucose-Capillary: 125 mg/dL — ABNORMAL HIGH (ref 70–99)
Glucose-Capillary: 130 mg/dL — ABNORMAL HIGH (ref 70–99)

## 2022-02-01 LAB — RENAL FUNCTION PANEL
Albumin: 2.7 g/dL — ABNORMAL LOW (ref 3.5–5.0)
Anion gap: 12 (ref 5–15)
BUN: 26 mg/dL — ABNORMAL HIGH (ref 6–20)
CO2: 24 mmol/L (ref 22–32)
Calcium: 9.1 mg/dL (ref 8.9–10.3)
Chloride: 95 mmol/L — ABNORMAL LOW (ref 98–111)
Creatinine, Ser: 4.14 mg/dL — ABNORMAL HIGH (ref 0.44–1.00)
GFR, Estimated: 12 mL/min — ABNORMAL LOW (ref >60)
Glucose, Bld: 138 mg/dL — ABNORMAL HIGH (ref 70–99)
Phosphorus: 4 mg/dL (ref 2.5–4.6)
Potassium: 4.2 mmol/L (ref 3.5–5.1)
Sodium: 131 mmol/L — ABNORMAL LOW (ref 135–145)

## 2022-02-01 NOTE — Progress Notes (Signed)
PROGRESS NOTE  [name]  DOB: 03-02-1966  PCP: [name] Ganong, MD XNT:[id number]  DOA: 01/16/2022  LOS: 63 days  Hospital Day: 17  Brief narrative: [name] is a 56 y.o. female with PMH significant for DM2, HTN, HLD, morbid obesity, OSA, CKD 5, CAD/CABG, chronic diastolic CHF.  Patient was recently hospitalized 9/27 to 10/17 for right foot diabetic ulcer for which podiatry and vascular surgery were consulted. Treated with antibiotics.  Hospitalization was complicated by development of generalized rash likely due to hypotension antibiotics.  Renal function worsened as well and hence patient needed dialysis.  Her renal function improved and she was discharged off dialysis with a creatinine of 2.5 on 10/17. 10/27, patient presented to OSH with altered mental status, lethargy, weakness.  Creatinine was elevated to 7.7, potassium elevated to 6.  Chest x-ray showed pulmonary edema.  Patient was given IV calcium gluconate, IV Lasix, insulin, dextrose and transferred to [facility]. Admitted to hospitalist service. Nephrology was consulted.  Patient was reinitiated on dialysis.  She is now clinically stable await placement.   Subjective: Patient in bed, appears comfortable, denies any headache, no fever, no chest pain or pressure, no shortness of breath , no abdominal pain. No new focal weakness.  Assessment and plan:  AKI on CKD 5, New ESRD - Presented with significantly elevated creatinine to 7.7 and potassium to 6.  Nephrology consulted. 10/28/2, tunnel catheter placed by IR.  Last dialysis this morning. Needs vein mapping for permanent access.  Vascular surgery to see. Patient has poor insight to her issues -high risk of noncompliance.  Now agreeable for SNF placement and for HD follow-up outpatient.  Acute on chronic heart failure with preserved ejection fraction (HFpEF), Hypotension , History of hypertension - Had volume overload at the time of admission.  Fluid removal through HD.   Midodrine for blood pressure support.  Chronic heart medications which are Coreg, Demadex and Imdur on hold due to hypotension.    Fever early morning 01/24/2022.  Likely was due to indwelling Foley catheter resolved after Foley removal.  Encephalopathy - due to uremia completely resolved.  Severe anxiety and bilateral lower extremity pain.  Added yesterday Klonopin, as needed Ativan for breakthrough anxiety, add Neurontin along with pain medications adjusted further.   Anemia of chronic disease - Hemoglobin stable around 9.  No acute issue.   Morbid obesity - -Body mass index is 48.17 kg/m. Patient has been advised to make an attempt to improve diet and exercise patterns to aid in weight loss.  OSA (obstructive sleep apnea)  - Continue CPAP nightly.   Right foot diabetic ulcer, 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 - Continue local wound care. Per wound care instructions, daily cleanse, pat dry and application of moisturizer (Eucerin cream) RLE wound will be covered with a folded layer of xeroform gauze topped with dry gauze. Both LEs are to be wrapped with Kerlix roll gauze and secured with paper tape. Feet are to be placed into Prevalon boots. A sacral silicone foam is to be placed for PI prevention.  On oral and IV pain medication.  During her last admission she was seen by Dr. Stanford Breed vascular surgeon and Dr. Loel Lofty podiatrist, she was again seen by podiatry this admission.  Continue supportive care.   Rash with antibiotics - skin flaking off. Moisturizers   Foley catheter placement upon admission- Inserted at outside hospital prior to admission on 01/16/22 . Discussed with nephrology.  Foley has been removed on  01/24/2022.   Generalized body ache - Most likely in the setting of uremia. Improved with supportive Rx.   Type 2 diabetes mellitus with diabetic peripheral neuropathy- A1c 6.9 on 10/25/2021, PTA on Basaglar.  Currently on hold. Currently  blood sugars controlled on sliding scale insulin Accu-Cheks. Gabapentin for neuropathy, currently on hold.  CBG (last 3)  Recent Labs    02/01/22 0032 02/01/22 0307 02/01/22 0749  GLUCAP 130* 125* 123*      Goals of care   Code Status: Full Code    Mobility: Encourage ambulation  Skin assessment:  Pressure Injury 12/28/21 Buttocks Right Stage 2 -  Partial thickness loss of dermis presenting as a shallow open injury with a red, pink wound bed without slough. (Active)  12/28/21 2300  Location: Buttocks  Location Orientation: Right  Staging: Stage 2 -  Partial thickness loss of dermis presenting as a shallow open injury with a red, pink wound bed without slough.  Wound Description (Comments):   Present on Admission: -- (I'm not sure)     Pressure Injury 12/28/21 Left Stage 2 -  Partial thickness loss of dermis presenting as a shallow open injury with a red, pink wound bed without slough. (Active)  12/28/21 2300  Location:   Location Orientation: Left  Staging: Stage 2 -  Partial thickness loss of dermis presenting as a shallow open injury with a red, pink wound bed without slough.  Wound Description (Comments):   Present on Admission: -- (I'm not sure)    Nutritional status:  Body mass index is 48.17 kg/m.       Active Pressure Injury/Wound(s)     Pressure Ulcer  Duration          Pressure Injury 12/28/21 Buttocks Right Stage 2 -  Partial thickness loss of dermis presenting as a shallow open injury with a red, pink wound bed without slough. 26 days   Pressure Injury 12/28/21 Left Stage 2 -  Partial thickness loss of dermis presenting as a shallow open injury with a red, pink wound bed without slough. 26 days             Diet:  Diet Order             Diet regular Room service appropriate? No; Fluid consistency: Thin  Diet effective now                   DVT prophylaxis:  heparin injection 5,000 Units Start: 01/17/22 0600    Fluid:  None Consultants: Nephrology Family Communication: None at bedside  Status is: Inpatient  Continue in-hospital care because: Outpatient dialysis chair arrangement Level of care: Med-Surg   She is now clinically stable await placement.    Scheduled Meds:  Chlorhexidine Gluconate Cloth  6 each Topical Q0600   clonazepam  0.5 mg Oral QHS   cyclobenzaprine  5 mg Oral TID   darbepoetin (ARANESP) injection - NON-DIALYSIS  40 mcg Subcutaneous Q Sun-1800   escitalopram  10 mg Oral Daily   gabapentin  200 mg Oral BID   heparin  5,000 Units Subcutaneous Q8H   hydrocerin   Topical Daily   insulin aspart  0-9 Units Subcutaneous Q4H   metoprolol tartrate  25 mg Oral BID   midodrine  10 mg Oral TID WC   sodium chloride  2 spray Each Nare TID AC & HS    PRN meds: acetaminophen **OR** acetaminophen, HYDROmorphone (DILAUDID) injection, hydrOXYzine, LORazepam, ondansetron **OR** ondansetron (ZOFRAN) IV, mouth rinse, oxyCODONE, oxymetazoline,  polyethylene glycol   Objective: Vitals:   02/01/22 0413 02/01/22 0849  BP: (!) 103/59 (!) 117/52  Pulse: 81 86  Resp: 12 16  Temp: 99.1 F (37.3 C) 98.6 F (37 C)  SpO2: 100% 100%    Intake/Output Summary (Last 24 hours) at 02/01/2022 1027 Last data filed at 02/01/2022 0310 Gross per 24 hour  Intake --  Output 350 ml  Net -350 ml    Filed Weights   01/28/22 1218 01/30/22 0756 01/30/22 1216  Weight: (!) 140.8 kg (!) 140.5 kg (!) 139.5 kg   Weight change:  Body mass index is 48.17 kg/m.   Physical Exam:  Awake Alert, No new F.N deficits, going for HD [place].AT,PERRAL Supple Neck, No JVD,   Symmetrical Chest wall movement, Good air movement bilaterally, CTAB RRR,No Gallops, Rubs or new Murmurs,  +ve B.Sounds, Abd Soft, No tenderness,   2+ lower extremity edema, bilateral Unna boots, chronic lichenification in her skin mostly in the lower extremities, right IJ HD catheter in place site clean.   Data Review: I have personally reviewed  the laboratory data and studies available.  Recent Labs  Lab 01/26/22 0421 01/27/22 0435 01/28/22 0639 01/29/22 0420 01/30/22 0810  WBC 13.2* 13.1* 9.9 8.5 9.7  HGB 8.2* 9.0* 8.7* 8.7* 8.2*  HCT 25.7* 28.8* 26.9* 28.6* 25.7*  PLT 171 178 160 141* 145*  MCV 86.0 86.7 85.4 89.7 87.7  MCH 27.4 27.1 27.6 27.3 28.0  MCHC 31.9 31.3 32.3 30.4 31.9  RDW 16.7* 17.2* 17.8* 18.0* 18.1*  LYMPHSABS 3.8 4.3* 2.5 3.0  --   MONOABS 1.1* 1.0 0.8 0.8  --   EOSABS 0.1 0.2 0.2 0.2  --   BASOSABS 0.1 0.1 0.1 0.1  --     Recent Labs  Lab 01/26/22 0421 01/27/22 0435 01/28/22 0639 01/29/22 0420 01/30/22 0248 01/31/22 0200 02/01/22 0315  NA 134* 133* 132* 133* 130* 133* 131*  K 3.9 3.8 3.8 3.6 4.0 3.7 4.2  CL 95* 95* 94* 96* 95* 96* 95*  CO2 '25 24 26 27 22 26 24  '$ GLUCOSE 121* 124* 145* 148* 175* 152* 138*  BUN 52* 36* 43* 27* 31* 19 26*  CREATININE 3.66* 3.07* 3.96* 3.28* 3.89* 3.29* 4.14*  CALCIUM 8.9 8.9 8.9 8.9 9.2 9.0 9.1  ALBUMIN 2.3* 2.8* 2.5* 2.5* 2.7* 2.9* 2.7*  PHOS 3.9 3.2 3.4 2.9 3.4 3.0 4.0  CRP 15.6* 12.6* 10.0*  --   --   --   --   PROCALCITON 0.82 0.90 0.91  --   --   --   --   BNP >4,500.0* >4,500.0* 3,263.3*  --   --   --   --      Signature  Lala Lund M.D on 02/01/2022 at 10:27 AM   -  To page go to [email or web address]

## 2022-02-01 NOTE — Progress Notes (Signed)
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  Previously palliative care involved and she did ulimately start HD last admission - per charting she just had hoped she would never need it and then they hoped had she enough renal function to come off.  First HD on 10/28 after tunn catheter with IR. She has very poor insight into her condition--> I was hoping her brother, who is on dialysis in 51 and who has been present for conversations related to her ESRD, can be a resource for her.  But she asked 11/9 that I stop asking about the brother. I am concerned that she will be poorly adherent to her dialysis and then will have a bad outcome. Very poor understanding of ESRD; she understands she will have to go TIW and was concerned with the impact on her life.  - HD on 10/30 HD #2, HD #3 11/1 - MWF schedule, tolerated HD on Fri.  Next HD Monday and will try to get her in a chair for hd; outpt center is going to want confirmation of this. I notified the pt and she will try; she has gotten out of bed to chair with assistance in the room but not clear if she will tolerate 4hrs in a chair.  -  CLIP'd renal navigator confirmed she's accepted at [facility] TTS.  - needs perm access- appreciate VVS consult ->  vein mapping and outpt perm access to allow her to process. She has panic attacks and per pt being pressured makes her panic so will slowly and gently educate + encourage.  # Hyperkalemia - resolved   # Confusion/encephalopathy - Concerning for uremia in the setting of CKD progression - now on HD - improved   # Hypotension - Hx of HTN - avoid hypotension - would hold imdur and coreg. Still on the low side. - Continue midodrine 10 mg TID (new med this admit)    # Chronic diastolic CHF  - holding imdur and coreg   - optimize volume status with HD    # Anemia CKD  - iron deficiency - repleted  earlier in the month per charting - Started aranesp 40 mcg weekly on Sundays for now (last given 11/5)   # Renal osteodystrophy - No binder necessary; phos 3.2  #Fever: - no fevers in the last 48 hrs - new HD cath - Foley d/c'd     Disposition - possibly SNF?  Subjective:   NAD. Very somnolent this am but appears to be comfortable.     Objective:   BP (!) 103/59 (BP Location: Left Arm)   Pulse 81   Temp 99.1 F (37.3 C) (Oral)   Resp 12   Ht '5\' 7"'$  (1.702 m)   Wt (!) 139.5 kg   SpO2 100%   BMI 48.17 kg/m   Intake/Output Summary (Last 24 hours) at 02/01/2022 0708 Last data filed at 02/01/2022 0310 Gross per 24 hour  Intake --  Output 350 ml  Net -350 ml    Weight change:   Physical Exam: Gen: Supine in bed, somnolent but comfortable, nl RR CVS: RRR Resp: clear Abd: soft Ext: 1+ LE edema ACCESS: R IJ TC  Imaging: No results found.  Labs: BMET Recent Labs  Lab 01/26/22 0421 01/27/22 0435 01/28/22 0639 01/29/22 0420 01/30/22 0248 01/31/22 0200 02/01/22 0315  NA 134* 133* 132* 133* 130* 133*  131*  K 3.9 3.8 3.8 3.6 4.0 3.7 4.2  CL 95* 95* 94* 96* 95* 96* 95*  CO2 '25 24 26 27 22 26 24  '$ GLUCOSE 121* 124* 145* 148* 175* 152* 138*  BUN 52* 36* 43* 27* 31* 19 26*  CREATININE 3.66* 3.07* 3.96* 3.28* 3.89* 3.29* 4.14*  CALCIUM 8.9 8.9 8.9 8.9 9.2 9.0 9.1  PHOS 3.9 3.2 3.4 2.9 3.4 3.0 4.0   CBC Recent Labs  Lab 01/26/22 0421 01/27/22 0435 01/28/22 0639 01/29/22 0420 01/30/22 0810  WBC 13.2* 13.1* 9.9 8.5 9.7  NEUTROABS 8.0* 7.5 6.3 4.5  --   HGB 8.2* 9.0* 8.7* 8.7* 8.2*  HCT 25.7* 28.8* 26.9* 28.6* 25.7*  MCV 86.0 86.7 85.4 89.7 87.7  PLT 171 178 160 141* 145*    Medications:     Chlorhexidine Gluconate Cloth  6 each Topical Q0600   clonazepam  0.5 mg Oral QHS   cyclobenzaprine  5 mg Oral TID   darbepoetin (ARANESP) injection - NON-DIALYSIS  40 mcg Subcutaneous Q Sun-1800   escitalopram  10 mg Oral Daily   gabapentin  200 mg Oral BID    heparin  5,000 Units Subcutaneous Q8H   hydrocerin   Topical Daily   insulin aspart  0-9 Units Subcutaneous Q4H   metoprolol tartrate  25 mg Oral BID   midodrine  10 mg Oral TID WC   sodium chloride  2 spray Each Nare TID AC & HS      Otelia Santee, MD 02/01/2022, 7:08 AM

## 2022-02-02 ENCOUNTER — Inpatient Hospital Stay (HOSPITAL_COMMUNITY): Payer: Medicare Other

## 2022-02-02 DIAGNOSIS — N179 Acute kidney failure, unspecified: Secondary | ICD-10-CM | POA: Diagnosis not present

## 2022-02-02 DIAGNOSIS — N184 Chronic kidney disease, stage 4 (severe): Secondary | ICD-10-CM | POA: Diagnosis not present

## 2022-02-02 LAB — RENAL FUNCTION PANEL
Albumin: 2.6 g/dL — ABNORMAL LOW (ref 3.5–5.0)
Anion gap: 14 (ref 5–15)
BUN: 33 mg/dL — ABNORMAL HIGH (ref 6–20)
CO2: 23 mmol/L (ref 22–32)
Calcium: 9 mg/dL (ref 8.9–10.3)
Chloride: 93 mmol/L — ABNORMAL LOW (ref 98–111)
Creatinine, Ser: 4.74 mg/dL — ABNORMAL HIGH (ref 0.44–1.00)
GFR, Estimated: 10 mL/min — ABNORMAL LOW (ref >60)
Glucose, Bld: 115 mg/dL — ABNORMAL HIGH (ref 70–99)
Phosphorus: 4.3 mg/dL (ref 2.5–4.6)
Potassium: 4.1 mmol/L (ref 3.5–5.1)
Sodium: 130 mmol/L — ABNORMAL LOW (ref 135–145)

## 2022-02-02 LAB — GLUCOSE, CAPILLARY
Glucose-Capillary: 112 mg/dL — ABNORMAL HIGH (ref 70–99)
Glucose-Capillary: 118 mg/dL — ABNORMAL HIGH (ref 70–99)
Glucose-Capillary: 137 mg/dL — ABNORMAL HIGH (ref 70–99)
Glucose-Capillary: 138 mg/dL — ABNORMAL HIGH (ref 70–99)
Glucose-Capillary: 118 mg/dL — ABNORMAL HIGH (ref 70–99)
Glucose-Capillary: 122 mg/dL — ABNORMAL HIGH (ref 70–99)

## 2022-02-02 MED ORDER — CLONAZEPAM 0.25 MG PO TBDP: 0.2500 mg | ORAL_TABLET | Freq: Every day | ORAL | Status: DC

## 2022-02-02 MED ORDER — LORAZEPAM 0.5 MG PO TABS
0.2500 mg | ORAL_TABLET | Freq: Two times a day (BID) | ORAL | Status: DC | PRN
  Administered 2022-02-03: 0.25 mg via ORAL
  Filled 2022-02-02: qty 1

## 2022-02-02 MED ORDER — SODIUM CHLORIDE 0.9 % IV SOLN
500.0000 mg | INTRAVENOUS | Status: DC
  Administered 2022-02-02: 500 mg via INTRAVENOUS
  Filled 2022-02-02 (×2): qty 10

## 2022-02-02 MED ORDER — CYCLOBENZAPRINE HCL 5 MG PO TABS: 5.0000 mg | ORAL_TABLET | Freq: Three times a day (TID) | ORAL | Status: DC

## 2022-02-02 MED ORDER — DARBEPOETIN ALFA 100 MCG/0.5ML IJ SOSY
100.0000 ug | PREFILLED_SYRINGE | INTRAMUSCULAR | Status: DC
  Filled 2022-02-02: qty 0.5

## 2022-02-02 MED ORDER — GABAPENTIN 100 MG PO CAPS
100.0000 mg | ORAL_CAPSULE | Freq: Two times a day (BID) | ORAL | Status: DC
  Administered 2022-02-02: 100 mg via ORAL
  Filled 2022-02-02: qty 1

## 2022-02-02 MED ORDER — NALOXONE HCL 0.4 MG/ML IJ SOLN: 0.2000 mg | INTRAMUSCULAR | Status: DC | PRN

## 2022-02-02 MED ORDER — HYDROMORPHONE HCL 1 MG/ML IJ SOLN
0.5000 mg | Freq: Two times a day (BID) | INTRAMUSCULAR | Status: DC | PRN
  Administered 2022-02-02: 0.5 mg via INTRAVENOUS
  Filled 2022-02-02: qty 0.5

## 2022-02-02 MED ORDER — LEVOFLOXACIN 500 MG PO TABS
500.0000 mg | ORAL_TABLET | ORAL | Status: AC
  Administered 2022-02-02: 500 mg via ORAL
  Filled 2022-02-02: qty 1

## 2022-02-02 MED ORDER — NALOXONE HCL 0.4 MG/ML IJ SOLN
0.1000 mg | Freq: Once | INTRAMUSCULAR | Status: AC
  Administered 2022-02-02: 0.1 mg via INTRAVENOUS
  Filled 2022-02-02: qty 1

## 2022-02-02 NOTE — Progress Notes (Signed)
Physical Therapy Treatment Patient Details Name: [name] MRN: [id number] DOB: [birth year] Today's Date: 02/02/2022   History of Present Illness [name] is a 56 y.o. female with PMH significant for DM2, HTN, HLD, morbid obesity, OSA, CKD 5, CAD/CABG, chronic diastolic CHF. She was previously admitted 9/27-10/17 with R foot ulcer, generalized rash and renal failure initiated HD, but with renal recovery.  Now admitted due to AMS, weakness, elevated creatinine and potassium, reinitiated dialysis.    PT Comments    Pt with decr cognition today and slow and inconsistent to follow commands. Attempted to sit EOB but pt unable to assist enough to get her to the EOB. Pt denied being sleepy or weak but closing eyes intermittently and not moving or assisting when attempting to mobilize. Also noted some asterixis in [name]. Hopefully HD later today will improve her cognition.  Continue to recommend SNF.   Recommendations for follow up therapy are one component of a multi-disciplinary discharge planning process, led by the attending physician.  Recommendations may be updated based on patient status, additional functional criteria and insurance authorization.  Follow Up Recommendations  Skilled nursing-short term rehab (<3 hours/day) Can patient physically be transported by private vehicle: Yes   Assistance Recommended at Discharge Frequent or constant Supervision/Assistance  Patient can return home with the following Two people to help with walking and/or transfers;Two people to help with bathing/dressing/bathroom;Assistance with cooking/housework;Help with stairs or ramp for entrance;Assist for transportation   Equipment Recommendations  None recommended by PT    Recommendations for Other Services       Precautions / Restrictions Precautions Precautions: Fall Restrictions Weight Bearing Restrictions: No     Mobility  Bed Mobility Overal bed mobility: Needs Assistance              General bed mobility comments: Attempted to perform supine to sitting EOB but pt unable to complete with max assist.    Transfers                   General transfer comment: Unable due to cognition    Ambulation/Gait                   Stairs             Wheelchair Mobility    Modified Rankin (Stroke Patients Only)       Balance                                            Cognition Arousal/Alertness: Lethargic, Awake/alert Behavior During Therapy: WFL for tasks assessed/performed Overall Cognitive Status: Impaired/Different from baseline Area of Impairment: Problem solving, Awareness, Safety/judgement, Following commands                       Following Commands: Follows one step commands inconsistently, Follows one step commands with increased time Safety/Judgement: Decreased awareness of deficits Awareness: Intellectual Problem Solving: Slow processing, Decreased initiation, Requires verbal cues, Requires tactile cues General Comments: Pt responding to questions but eyes closing intermittently and responses slowed. Asked pt to come to sitting EOB and pt not moving at all initially. Began assisting pt and she was only intermittently attempting to move. Pt stated several times that she could do it (sit up on EOB) but not moving to do that.        Exercises  General Comments General comments (skin integrity, edema, etc.): Pt on 2L. SpO2 varying from mid 80's to high 90's so question accuracy of monitor.      Pertinent Vitals/Pain Pain Assessment Pain Assessment: Faces Faces Pain Scale: No hurt    Home Living                          Prior Function            PT Goals (current goals can now be found in the care plan section) Acute Rehab PT Goals Patient Stated Goal: to go home Progress towards PT goals: Not progressing toward goals - comment (decr cognition)    Frequency    Min  3X/week      PT Plan Current plan remains appropriate    Co-evaluation              AM-PAC PT "6 Clicks" Mobility   Outcome Measure  Help needed turning from your back to your side while in a flat bed without using bedrails?: Total Help needed moving from lying on your back to sitting on the side of a flat bed without using bedrails?: Total Help needed moving to and from a bed to a chair (including a wheelchair)?: Total Help needed standing up from a chair using your arms (e.g., wheelchair or bedside chair)?: Total Help needed to walk in hospital room?: Total Help needed climbing 3-5 steps with a railing? : Total 6 Click Score: 6    End of Session Equipment Utilized During Treatment: Oxygen Activity Tolerance: Patient limited by fatigue;Patient limited by lethargy Patient left: with call bell/phone within reach;in bed;with bed alarm set Nurse Communication: Other (comment) (Poor cognition) PT Visit Diagnosis: Other abnormalities of gait and mobility (R26.89)     Time: 4680-3212 PT Time Calculation (min) (ACUTE ONLY): 19 min  Charges:  $Therapeutic Activity: 8-22 mins                     [place] 02/02/2022, 1:24 PM

## 2022-02-02 NOTE — Progress Notes (Addendum)
[facility] NEPHROLOGY PROGRESS NOTE  Assessment/ Plan: Pt is a 56 y.o. yo female with PMH of DM 2, HTN, HLD, morbid obesity, OSA,CKD 5, CAD/CABG, chronic diastolic CHF presented with altered mental status, lethargy, found to have worsening renal failure and hyperkalemia with fluid overload.  She was recently admitted from 9/27 - 10/17 for right foot diabetic ulcer and was seen by podiatrist and vascular surgeon, treated with antibiotics.  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  First HD on 10/28 after tunnel HD catheter.  It seems like patient has poor insight into dialysis.  Plan to do HD today in chair that will help discharge planning.  The patient is excepted at [place] for TTS schedule.  We may have to move her to TTS schedule gradually.  Seen by vascular surgeon for permanent access, will likely pursue as outpatient.  #Acute metabolic encephalopathy: Concern for uremia in the past but not much improvement in mental status even after bringing BUN down.  Continue to provide supportive care, PT OT therapy.  # Anemia of CKD: Received IV iron earlier this month.  Continue Aranesp weekly and monitor hemoglobin.  Increase ESA dose.  #CKD-MBD: Calcium and phosphorus level acceptable.  Continue to monitor.  # HTN/volume: Blood pressure soft on midodrine.  Difficult to assess volume status because of body habitus.  Try to UF as tolerated.  Subjective: Seen and examined bedside.  Patient was alert awake but slow to respond.  She said she is okay and doing well.  No new event. Objective Vital signs in last 24 hours: Vitals:   02/02/22 0000 02/02/22 0432 02/02/22 0844 02/02/22 1212  BP: (!) 102/54 (!) 88/61 119/80 109/60  Pulse:  96 93 93  Resp: '14 19 19 19  '$ Temp:  99.3 F (37.4 C) 97.9 F (36.6 C) 99 F (37.2 C)  TempSrc:  Oral Oral Oral  SpO2: 100% 95% 99% 97%  Weight:      Height:       Weight change:  No intake or output data in the 24 hours ending 02/02/22  1438     Labs: RENAL PANEL Recent Labs    10/29/21 0500 10/30/21 0500 12/24/21 0653 12/25/21 0044 12/26/21 0034 12/27/21 0050 12/28/21 0044 12/28/21 8127 12/29/21 0258 12/30/21 0148 12/30/21 1829 12/31/21 0429 01/01/22 0032 01/16/22 2304 01/17/22 5170 01/24/22 0351 01/25/22 0174 01/26/22 0421 01/27/22 0435 01/28/22 9449 01/29/22 0420 01/30/22 0248 01/31/22 0200 02/01/22 0315 02/02/22 0312  NA 138   < > 131* 129* 132* 130*   < >  --  132* 131*   < > 137   < > 138   < > 133* 132* 134* 133* 132* 133* 130* 133* 131* 130*  K 4.1   < > 4.0 3.7 3.7 3.9   < >  --  3.8 3.7   < > 3.2*   < > 6.4*   < > 3.9 3.7 3.9 3.8 3.8 3.6 4.0 3.7 4.2 4.1  CL 103   < > 97* 98 98 97*   < >  --  101 98   < > 102   < > 103   < > 99 95* 95* 95* 94* 96* 95* 96* 95* 93*  CO2 26   < > 19* 18* 21* 19*   < >  --  22 20*   < > 24   < > 23   < > '25 23 25 24 26 27 22 26 24 23  '$ GLUCOSE  169*   < > 96 220* 218* 178*   < >  --  244* 248*   < > 165*   < > 155*   < > 169* 160* 121* 124* 145* 148* 175* 152* 138* 115*  BUN 33*   < > 118* 128* 141* 148*   < >  --  161* 155*   < > 111*   < > 116*   < > 37* 44* 52* 36* 43* 27* 31* 19 26* 33*  CREATININE 2.82*   < > 4.50* 4.45* 4.43* 4.64*   < >  --  4.50* 4.16*   < > 2.93*   < > 8.43*   < > 2.80* 3.25* 3.66* 3.07* 3.96* 3.28* 3.89* 3.29* 4.14* 4.74*  CALCIUM 9.3   < > 9.0 8.9 9.0 8.8*   < >  --  8.6* 8.4*   < > 8.4*   < > 8.8*   < > 8.6* 8.7* 8.9 8.9 8.9 8.9 9.2 9.0 9.1 9.0  MG 1.8  --  2.2 2.3 2.4 2.2  --  2.2 2.3 2.1  --  1.9  --  2.2  --   --   --   --   --   --   --   --   --   --   --   PHOS  --    < > 5.7* 6.5* 5.6* 5.3*   < >  --  5.4* 4.8*   < > 3.2   < > 7.8*   < > 3.0 3.5 3.9 3.2 3.4 2.9 3.4 3.0 4.0 4.3  ALBUMIN  --    < > 2.1* 2.1* 2.2* 2.2*   < >  --  1.9* 2.0*   < > 1.8*   < > 2.4*   < > 2.5* 2.4* 2.3* 2.8* 2.5* 2.5* 2.7* 2.9* 2.7* 2.6*   < > = values in this interval not displayed.     Liver Function Tests: Recent Labs  Lab 01/31/22 0200  02/01/22 0315 02/02/22 0312  ALBUMIN 2.9* 2.7* 2.6*   No results for input(s): "LIPASE", "AMYLASE" in the last 168 hours. No results for input(s): "AMMONIA" in the last 168 hours. CBC: Recent Labs    10/27/21 0600 10/28/21 0754 12/23/21 1326 12/23/21 1802 12/25/21 0044 01/27/22 0435 01/28/22 0639 01/29/22 0420 01/30/22 0810 02/01/22 0315  HGB 7.3*   < > 7.9*  --    < > 9.0* 8.7* 8.7* 8.2* 8.5*  MCV 85.7   < > 84.7  --    < > 86.7 85.4 89.7 87.7 88.0  VITAMINB12 534  --   --   --   --   --   --   --   --   --   FOLATE 16.3  --   --   --   --   --   --   --   --   --   FERRITIN 39  --   --  84  --   --   --   --   --   --   TIBC 413  --   --  308  --   --   --   --   --   --   IRON 37  --   --  23*  --   --   --   --   --   --   RETICCTPCT  --   --  4.5*  --   --   --   --   --   --   --    < > =  values in this interval not displayed.    Cardiac Enzymes: No results for input(s): "CKTOTAL", "CKMB", "CKMBINDEX", "TROPONINI" in the last 168 hours. CBG: Recent Labs  Lab 02/01/22 1957 02/01/22 2351 02/02/22 0427 02/02/22 0721 02/02/22 1131  GLUCAP 116* 109* 112* 118* 137*    Iron Studies: No results for input(s): "IRON", "TIBC", "TRANSFERRIN", "FERRITIN" in the last 72 hours. Studies/Results: No results found.  Medications: Infusions:   Scheduled Medications:  Chlorhexidine Gluconate Cloth  6 each Topical Q0600   clonazepam  0.25 mg Oral QHS   cyclobenzaprine  5 mg Oral TID   darbepoetin (ARANESP) injection - NON-DIALYSIS  40 mcg Subcutaneous Q Sun-1800   escitalopram  10 mg Oral Daily   gabapentin  100 mg Oral BID   heparin  5,000 Units Subcutaneous Q8H   hydrocerin   Topical Daily   insulin aspart  0-9 Units Subcutaneous Q4H   metoprolol tartrate  25 mg Oral BID   midodrine  10 mg Oral TID WC   sodium chloride  2 spray Each Nare TID AC & HS    have reviewed scheduled and prn medications.  Physical Exam: General: Confused female lying on bed, does not  look in distress Heart:RRR, s1s2 nl Lungs: Distant breath sound, no wheeze Abdomen:soft,  non-distended Extremities: Trace leg edema present Dialysis Access: Right IJ TDC.  Marland Reine Prasad Severino Paolo 02/02/2022,2:38 PM  LOS: 17 days

## 2022-02-02 NOTE — TOC Progression Note (Addendum)
Transition of Care [facility]) - Progression Note    Patient Details  Name: Gema Ringold MRN: [id number] Date of Birth: 05-May-1965  Transition of Care [facility] CM/SW [place], LCSW Phone Number: 02/02/2022, 10:52 AM  Clinical Narrative:    CSW contacted [facility] and insurance authorization has been received, Ref # [id number], effective 11/13-11/15 with care coorinator as Darnelle Bos.   Renal Navigator assisting to switch patient HD days to MWF for SNF. Patient to complete HD today in a chair.    Expected Discharge Plan: [place] Barriers to Discharge: Continued Medical Work up  Expected Discharge Plan and Services Expected Discharge Plan: [place] arrangements for the past 2 months: Single Family Home                                       Social Determinants of Health (SDOH) Interventions    Readmission Risk Interventions    12/18/2021    2:02 PM 11/05/2021    1:58 PM  Readmission Risk Prevention Plan  Transportation Screening Complete Complete  PCP or Specialist Appt within 5-7 Days  Complete  PCP or Specialist Appt within 3-5 Days Complete   Home Care Screening  Complete  Medication Review (RN CM)  Complete  HRI or Home Care Consult Complete   Social Work Consult for [place] Planning/Counseling Complete   Palliative Care Screening Not Applicable   Medication Review Press photographer) Complete

## 2022-02-02 NOTE — Progress Notes (Signed)
PROGRESS NOTE  [name]  DOB: 1966-03-01  PCP: Jacklynn Ganong, MD ATF:[id number]  DOA: 01/16/2022  LOS: 18 days  Hospital Day: 18  Brief narrative: [name] is a 56 y.o. female with PMH significant for DM2, HTN, HLD, morbid obesity, OSA, CKD 5, CAD/CABG, chronic diastolic CHF.  Patient was recently hospitalized 9/27 to 10/17 for right foot diabetic ulcer for which podiatry and vascular surgery were consulted. Treated with antibiotics.  Hospitalization was complicated by development of generalized rash likely due to hypotension antibiotics.  Renal function worsened as well and hence patient needed dialysis.  Her renal function improved and she was discharged off dialysis with a creatinine of 2.5 on 10/17. 10/27, patient presented to OSH with altered mental status, lethargy, weakness.  Creatinine was elevated to 7.7, potassium elevated to 6.  Chest x-ray showed pulmonary edema.  Patient was given IV calcium gluconate, IV Lasix, insulin, dextrose and transferred to [facility]. Admitted to hospitalist service. Nephrology was consulted.  Patient was reinitiated on dialysis.  She is now clinically stable await placement.  Patient is not acting her choice of SNF.  She has been giving multiple excuses now for over 5 days. TOC trying to find an appropriate place for her.   Subjective: Patient in bed, appears comfortable, denies any headache, no fever, no chest pain or pressure, no shortness of breath , no abdominal pain. No new focal weakness.   Assessment and plan:  AKI on CKD 5, New ESRD - Presented with significantly elevated creatinine to 7.7 and potassium to 6.  Nephrology consulted. 10/28/2, tunnel catheter placed by IR.  Last dialysis this morning. Needs vein mapping for permanent access.  Vascular surgery to see. Patient has poor insight to her issues -high risk of noncompliance.  Now agreeable for SNF placement and for HD follow-up outpatient.  Acute on chronic heart failure with  preserved ejection fraction (HFpEF), Hypotension , History of hypertension - Had volume overload at the time of admission.  Fluid removal through HD.  Midodrine for blood pressure support.  Chronic heart medications which are Coreg, Demadex and Imdur on hold due to hypotension.    Fever early morning 01/24/2022.  Likely was due to indwelling Foley catheter resolved after Foley removal.  Encephalopathy - due to uremia completely resolved.  Severe anxiety and bilateral lower extremity pain.  Added yesterday Klonopin, as needed Ativan for breakthrough anxiety, add Neurontin along with pain medications adjusted further.   Anemia of chronic disease - Hemoglobin stable around 9.  No acute issue.   Morbid obesity - -Body mass index is 48.17 kg/m. Patient has been advised to make an attempt to improve diet and exercise patterns to aid in weight loss.  OSA (obstructive sleep apnea)  - Continue CPAP nightly.   Right foot diabetic ulcer, 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 - Continue local wound care. Per wound care instructions, daily cleanse, pat dry and application of moisturizer (Eucerin cream) RLE wound will be covered with a folded layer of xeroform gauze topped with dry gauze. Both LEs are to be wrapped with Kerlix roll gauze and secured with paper tape. Feet are to be placed into Prevalon boots. A sacral silicone foam is to be placed for PI prevention.  On oral and IV pain medication.  During her last admission she was seen by Dr. Stanford Breed vascular surgeon and Dr. Loel Lofty podiatrist, she was again seen by podiatry this admission.  Continue supportive care.   Rash with antibiotics -  skin flaking off. Moisturizers   Foley catheter placement upon admission- Inserted at outside hospital prior to admission on 01/16/22 . Discussed with nephrology.  Foley has been removed on 01/24/2022.   Generalized body ache - Most likely in the setting of uremia. Improved with  supportive Rx.   Type 2 diabetes mellitus with diabetic peripheral neuropathy- A1c 6.9 on 10/25/2021, PTA on Basaglar.  Currently on hold. Currently blood sugars controlled on sliding scale insulin Accu-Cheks. Gabapentin for neuropathy, currently on hold.  CBG (last 3)  Recent Labs    02/01/22 2351 02/02/22 0427 02/02/22 0721  GLUCAP 109* 112* 118*      Goals of care   Code Status: Full Code    Mobility: Encourage ambulation  Skin assessment:  Pressure Injury 12/28/21 Buttocks Right Stage 2 -  Partial thickness loss of dermis presenting as a shallow open injury with a red, pink wound bed without slough. (Active)  12/28/21 2300  Location: Buttocks  Location Orientation: Right  Staging: Stage 2 -  Partial thickness loss of dermis presenting as a shallow open injury with a red, pink wound bed without slough.  Wound Description (Comments):   Present on Admission: -- (I'm not sure)     Pressure Injury 12/28/21 Left Stage 2 -  Partial thickness loss of dermis presenting as a shallow open injury with a red, pink wound bed without slough. (Active)  12/28/21 2300  Location:   Location Orientation: Left  Staging: Stage 2 -  Partial thickness loss of dermis presenting as a shallow open injury with a red, pink wound bed without slough.  Wound Description (Comments):   Present on Admission: -- (I'm not sure)    Nutritional status:  Body mass index is 48.17 kg/m.       Active Pressure Injury/Wound(s)     Pressure Ulcer  Duration          Pressure Injury 12/28/21 Buttocks Right Stage 2 -  Partial thickness loss of dermis presenting as a shallow open injury with a red, pink wound bed without slough. 26 days   Pressure Injury 12/28/21 Left Stage 2 -  Partial thickness loss of dermis presenting as a shallow open injury with a red, pink wound bed without slough. 26 days             Diet:  Diet Order             Diet regular Room service appropriate? No; Fluid consistency:  Thin  Diet effective now                   DVT prophylaxis:  heparin injection 5,000 Units Start: 01/17/22 0600    Fluid: None Consultants: Nephrology Family Communication: None at bedside  Status is: Inpatient  Continue in-hospital care because: Outpatient dialysis chair arrangement Level of care: Med-Surg   She is now clinically stable await placement.    Scheduled Meds:  Chlorhexidine Gluconate Cloth  6 each Topical Q0600   clonazepam  0.25 mg Oral QHS   cyclobenzaprine  5 mg Oral TID   darbepoetin (ARANESP) injection - NON-DIALYSIS  40 mcg Subcutaneous Q Sun-1800   escitalopram  10 mg Oral Daily   gabapentin  100 mg Oral BID   heparin  5,000 Units Subcutaneous Q8H   hydrocerin   Topical Daily   insulin aspart  0-9 Units Subcutaneous Q4H   metoprolol tartrate  25 mg Oral BID   midodrine  10 mg Oral TID WC   sodium chloride  2 spray Each Nare TID AC & HS    PRN meds: acetaminophen **OR** acetaminophen, HYDROmorphone (DILAUDID) injection, hydrOXYzine, LORazepam, ondansetron **OR** ondansetron (ZOFRAN) IV, mouth rinse, oxyCODONE, oxymetazoline, polyethylene glycol   Objective: Vitals:   02/02/22 0432 02/02/22 0844  BP: (!) 88/61 119/80  Pulse: 96 93  Resp: 19 19  Temp: 99.3 F (37.4 C) 97.9 F (36.6 C)  SpO2: 95% 99%    Intake/Output Summary (Last 24 hours) at 02/02/2022 1050 Last data filed at 02/01/2022 1100 Gross per 24 hour  Intake 240 ml  Output --  Net 240 ml    Filed Weights   01/28/22 1218 01/30/22 0756 01/30/22 1216  Weight: (!) 140.8 kg (!) 140.5 kg (!) 139.5 kg   Weight change:  Body mass index is 48.17 kg/m.   Physical Exam:  Awake Alert, No new F.N deficits, Normal affect [place].AT,PERRAL Supple Neck, No JVD,   Symmetrical Chest wall movement, Good air movement bilaterally, CTAB RRR,No Gallops, Rubs or new Murmurs,  +ve B.Sounds, Abd Soft, No tenderness,   2+ lower extremity edema, bilateral Unna boots, chronic lichenification  in her skin mostly in the lower extremities, right IJ HD catheter in place site clean.   Data Review: I have personally reviewed the laboratory data and studies available.  Recent Labs  Lab 01/27/22 0435 01/28/22 0639 01/29/22 0420 01/30/22 0810 02/01/22 0315  WBC 13.1* 9.9 8.5 9.7 9.4  HGB 9.0* 8.7* 8.7* 8.2* 8.5*  HCT 28.8* 26.9* 28.6* 25.7* 27.9*  PLT 178 160 141* 145* 141*  MCV 86.7 85.4 89.7 87.7 88.0  MCH 27.1 27.6 27.3 28.0 26.8  MCHC 31.3 32.3 30.4 31.9 30.5  RDW 17.2* 17.8* 18.0* 18.1* 17.7*  LYMPHSABS 4.3* 2.5 3.0  --   --   MONOABS 1.0 0.8 0.8  --   --   EOSABS 0.2 0.2 0.2  --   --   BASOSABS 0.1 0.1 0.1  --   --     Recent Labs  Lab 01/27/22 0435 01/28/22 0639 01/29/22 0420 01/30/22 0248 01/31/22 0200 02/01/22 0315 02/02/22 0312  NA 133* 132* 133* 130* 133* 131* 130*  K 3.8 3.8 3.6 4.0 3.7 4.2 4.1  CL 95* 94* 96* 95* 96* 95* 93*  CO2 '24 26 27 22 26 24 23  '$ GLUCOSE 124* 145* 148* 175* 152* 138* 115*  BUN 36* 43* 27* 31* 19 26* 33*  CREATININE 3.07* 3.96* 3.28* 3.89* 3.29* 4.14* 4.74*  CALCIUM 8.9 8.9 8.9 9.2 9.0 9.1 9.0  ALBUMIN 2.8* 2.5* 2.5* 2.7* 2.9* 2.7* 2.6*  PHOS 3.2 3.4 2.9 3.4 3.0 4.0 4.3  CRP 12.6* 10.0*  --   --   --   --   --   PROCALCITON 0.90 0.91  --   --   --   --   --   BNP >4,500.0* 3,263.3*  --   --   --   --   --      Signature  Lala Lund M.D on 02/02/2022 at 10:50 AM   -  To page go to [email or web address]

## 2022-02-02 NOTE — Progress Notes (Signed)
Due to her multiple questionable abx allergies, we will give Levaquin x1 now then start meropenem '500mg'$  IV q24 tonight post HD for UTI/sepsis per Dr. Mingo Amber, PharmD, BCIDP, AAHIVP, CPP Infectious Disease Pharmacist 02/02/2022 4:17 PM

## 2022-02-02 NOTE — Progress Notes (Signed)
Contacted by CSW this am that pt has accepted bed at [facility] and snf is requesting a MWF at [facility] or [facility] and they do not have a MWF available. Contacted [facility] admissions this am to request a MWF at [facility]. Awaiting final determination/acceptance for MWF chair. Nephrologist aware of this info as well. Will assist as needed.  Melven Sartorius Renal Navigator [phone]

## 2022-02-02 NOTE — Plan of Care (Signed)

## 2022-02-03 LAB — RENAL FUNCTION PANEL
Albumin: 2.4 g/dL — ABNORMAL LOW (ref 3.5–5.0)
Anion gap: 12 (ref 5–15)
BUN: 39 mg/dL — ABNORMAL HIGH (ref 6–20)
CO2: 25 mmol/L (ref 22–32)
Calcium: 9 mg/dL (ref 8.9–10.3)
Chloride: 95 mmol/L — ABNORMAL LOW (ref 98–111)
Creatinine, Ser: 5.1 mg/dL — ABNORMAL HIGH (ref 0.44–1.00)
GFR, Estimated: 9 mL/min — ABNORMAL LOW (ref >60)
Glucose, Bld: 113 mg/dL — ABNORMAL HIGH (ref 70–99)
Phosphorus: 4.6 mg/dL (ref 2.5–4.6)
Potassium: 4.5 mmol/L (ref 3.5–5.1)
Sodium: 132 mmol/L — ABNORMAL LOW (ref 135–145)

## 2022-02-03 LAB — CBC
HCT: 28.2 % — ABNORMAL LOW (ref 36.0–46.0)
Hemoglobin: 9.1 g/dL — ABNORMAL LOW (ref 12.0–15.0)
MCH: 27.5 pg (ref 26.0–34.0)
MCHC: 32.3 g/dL (ref 30.0–36.0)
MCV: 85.2 fL (ref 80.0–100.0)
Platelets: 222 10*3/uL (ref 150–400)
RBC: 3.31 MIL/uL — ABNORMAL LOW (ref 3.87–5.11)
RDW: 17.3 % — ABNORMAL HIGH (ref 11.5–15.5)
WBC: 7.9 10*3/uL (ref 4.0–10.5)
nRBC: 0 % (ref 0.0–0.2)

## 2022-02-03 LAB — GLUCOSE, CAPILLARY
Glucose-Capillary: 114 mg/dL — ABNORMAL HIGH (ref 70–99)
Glucose-Capillary: 112 mg/dL — ABNORMAL HIGH (ref 70–99)

## 2022-02-03 MED ORDER — SALINE SPRAY 0.65 % NA SOLN: 2.0000 | Freq: Three times a day (TID) | NASAL | 0 refills | Status: AC

## 2022-02-03 MED ORDER — HEPARIN SODIUM (PORCINE) 1000 UNIT/ML IJ SOLN
INTRAMUSCULAR | Status: AC
  Filled 2022-02-03: qty 4

## 2022-02-03 MED ORDER — POLYETHYLENE GLYCOL 3350 17 G PO PACK: 17.0000 g | PACK | Freq: Every day | ORAL | 0 refills | Status: AC | PRN

## 2022-02-03 MED ORDER — OXYCODONE HCL 5 MG PO TABS: 5.0000 mg | ORAL_TABLET | Freq: Four times a day (QID) | ORAL | 0 refills | Status: AC | PRN

## 2022-02-03 MED ORDER — LORAZEPAM 1 MG PO TABS: 0.5000 mg | ORAL_TABLET | Freq: Two times a day (BID) | ORAL | 0 refills | Status: AC

## 2022-02-03 MED ORDER — HYDROXYZINE HCL 10 MG PO TABS
10.0000 mg | ORAL_TABLET | Freq: Three times a day (TID) | ORAL | Status: DC | PRN
  Administered 2022-02-03: 10 mg via ORAL
  Filled 2022-02-03: qty 1

## 2022-02-03 MED ORDER — METOPROLOL TARTRATE 25 MG PO TABS: 25.0000 mg | ORAL_TABLET | Freq: Two times a day (BID) | ORAL | Status: AC

## 2022-02-03 MED ORDER — INSULIN LISPRO (1 UNIT DIAL) 100 UNIT/ML (KWIKPEN): PEN_INJECTOR | SUBCUTANEOUS | 0 refills | Status: AC

## 2022-02-03 MED ORDER — LORAZEPAM 0.5 MG PO TABS
0.5000 mg | ORAL_TABLET | Freq: Two times a day (BID) | ORAL | Status: DC | PRN
  Administered 2022-02-03: 0.5 mg via ORAL
  Filled 2022-02-03: qty 1

## 2022-02-03 MED ORDER — ACETAMINOPHEN 325 MG PO TABS: 650.0000 mg | ORAL_TABLET | Freq: Four times a day (QID) | ORAL | Status: AC | PRN

## 2022-02-03 MED ORDER — MIDODRINE HCL 10 MG PO TABS: 10.0000 mg | ORAL_TABLET | Freq: Three times a day (TID) | ORAL | Status: AC

## 2022-02-03 MED ORDER — HYDROCERIN EX CREA: 1.0000 | TOPICAL_CREAM | Freq: Two times a day (BID) | CUTANEOUS | 0 refills | Status: AC

## 2022-02-03 MED ORDER — ESCITALOPRAM OXALATE 10 MG PO TABS: 10.0000 mg | ORAL_TABLET | Freq: Every day | ORAL | Status: AC

## 2022-02-03 NOTE — Discharge Summary (Signed)
Shonica Weier KJZ:[id number] DOB: 11-Jun-1965 DOA: 01/16/2022  PCP: Jacklynn Ganong, MD  Admit date: 01/16/2022  Discharge date: 02/03/2022  Admitted From: Home   Disposition:  SNF   Recommendations for Outpatient Follow-up:   Follow up with PCP in 1-2 weeks  PCP Please obtain BMP/CBC, 2 view CXR in 1week,  (see Discharge instructions)   PCP Please follow up on the following pending results:    Home Health: None   Equipment/Devices: None  Consultations: Renal Discharge Condition: Stable    CODE STATUS: Full    Diet Recommendation: Renal diet with 1.2 L fluid restriction per day  Chief Complain.  Shortness of breath  Brief history of present illness from the day of admission and additional interim summary    56 y.o. female with PMH significant for DM2, HTN, HLD, morbid obesity, OSA, CKD 5, CAD/CABG, chronic diastolic CHF.  Patient was recently hospitalized 9/27 to 10/17 for right foot diabetic ulcer for which podiatry and vascular surgery were consulted. Treated with antibiotics.  Hospitalization was complicated by development of generalized rash likely due to hypotension antibiotics.  Renal function worsened as well and hence patient needed dialysis.  Her renal function improved and she was discharged off dialysis with a creatinine of 2.5 on 10/17.  10/27, patient presented to OSH with altered mental status, lethargy, weakness.  Creatinine was elevated to 7.7, potassium elevated to 6.  Chest x-ray showed pulmonary edema.  Patient was given IV calcium gluconate, IV Lasix, insulin, dextrose and transferred to [facility].  Admitted to hospitalist service. Nephrology was consulted.  Patient was reinitiated on dialysis.                                                                 Hospital Course    AKI on CKD 5,  New ESRD - Presented with significantly elevated creatinine to 7.7 and potassium to 6.  Nephrology consulted. 10/28/2, tunnel catheter placed by IR.  Last dialysis this morning. Needs vein mapping for permanent access.  Vascular surgery to see. Patient has poor insight to her issues -high risk of noncompliance.  Now agreeable for SNF placement and for HD follow-up outpatient.   Acute on chronic heart failure with preserved ejection fraction (HFpEF), Hypotension , History of hypertension - Had volume overload at the time of admission.  Fluid removal through HD.  Midodrine for blood pressure support.  Chronic heart medications which are Coreg, Demadex and Imdur on hold due to hypotension.     Encephalopathy - due to uremia completely resolved.   Severe anxiety and bilateral lower extremity pain.  Frequently overuses and gets sedated, medications adjusted.  Avoid overuse.   Anemia of chronic disease - Hemoglobin stable around 9.  No acute issue.   Morbid obesity - -Body mass index is 48.17 kg/m.  Patient has been advised to make an attempt to improve diet and exercise patterns to aid in weight loss.   OSA (obstructive sleep apnea)  - Continue CPAP nightly.   Right foot diabetic ulcer, 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 - Continue local wound care. Per wound care instructions, daily cleanse, pat dry and application of moisturizer (Eucerin cream) RLE wound will be covered with a folded layer of xeroform gauze topped with dry gauze. Both LEs are to be wrapped with Kerlix roll gauze and secured with paper tape. Feet are to be placed into Prevalon boots. A sacral silicone foam is to be placed for PI prevention.  On oral and IV pain medication.  During her last admission she was seen by Dr. Stanford Breed vascular surgeon and Dr. Loel Lofty podiatrist, she was again seen by podiatry this admission.  Continue supportive care at SNF   Rash with antibiotics - skin flaking off.  Better with supportive Rx.   Foley catheter placement upon admission- Inserted at outside hospital prior to admission on 01/16/22 . Discussed with nephrology. Foley has been removed on 01/24/2022.   Generalized body ache - Most likely in the setting of uremia. Improved with supportive Rx.   Type 2 diabetes mellitus with diabetic peripheral neuropathy- A1c 6.9 on 10/25/2021, ISS   CBG (last 3)    CBG (last 3)  Recent Labs    02/02/22 2006 02/02/22 2303 02/03/22 0339  GLUCAP 118* 122* 114*      Discharge diagnosis     Principal Problem:   Acute renal failure superimposed on stage 4 chronic kidney disease (HCC) Active Problems:   Hyperkalemia, diminished renal excretion   Acute on chronic heart failure with preserved ejection fraction (HFpEF) (HCC)   Uremic encephalopathy   Hypertension associated with diabetes (HCC)   Insulin dependent type 2 diabetes mellitus (HCC)   Anemia of chronic disease   Wound of right foot   Chronic diastolic heart failure (HCC)   Obesity, morbid, BMI 50 or higher (HCC)   OSA (obstructive sleep apnea)   Diabetic foot ulcer (HCC)   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  On your next visit with your primary care physician please Get Medicines reviewed and adjusted.  Please request your Prim.MD to go over all Hospital Tests and  Procedure/Radiological results at the follow up, please get all Hospital records sent to your Prim MD by signing hospital release before you go home.  If you experience worsening of your admission symptoms, develop shortness of breath, life threatening emergency, suicidal or homicidal thoughts you must seek medical attention immediately by calling 911 or calling your MD immediately  if symptoms less severe.  You Must read complete instructions/literature along with all the possible adverse reactions/side effects for all the Medicines  you take and that have been prescribed to you. Take any new Medicines after you have completely understood and accpet all the possible adverse reactions/side effects.   Discharge wound care:   Complete by: As directed    Daily - Wound care to bilateral LEs, including small partial thickness wound to RLE:  Cleanse with soap and water, rinse and pat dry. Apply Eucerin Cream to legs and feet, but do not apply between digits.  Cover wouind on right LE with folded layer of xeroform gause Kellie Simmering # 294), top with dry gauze. Wrap LEs with Kerlix roll gauze/paper tape. Place feet into Prevalon boots.   Increase activity slowly   Complete by: As directed        Discharge Medications   Allergies as of 02/03/2022       Reactions   Bactrim [sulfamethoxazole-trimethoprim] Other (See Comments)   Skin peeled off   Diphenhydramine-acetaminophen Hives   Benadryl [diphenhydramine] Hives   Cephalosporins Anxiety   Ms Contin [morphine] Other (See Comments)   Severe somnolence   Tylenol Pm Extra [diphenhydramine-apap (sleep)] Hives   Valium [diazepam] Hives   Zocor [simvastatin] Other (See Comments)   Arthralgia    Vancomycin Other (See Comments)   Skin peeling due to one of the following: Vancomycin, flagyl, or rocephin. Vanc seems to be the most likely culprit. ? SJS, interestingly h/o "skin peeled off" to bactrim as well (sounds really suspicious for SJS to me).    Ceftriaxone Rash   Cipro [ciprofloxacin Hcl] Anxiety   Latex Itching   Levaquin [levofloxacin] Anxiety        Medication List     STOP taking these medications    Basaglar KwikPen 100 UNIT/ML   carvedilol 25 MG tablet Commonly known as: COREG   citalopram 20 MG tablet Commonly known as: CELEXA   cyclobenzaprine 10 MG tablet Commonly known as: FLEXERIL   gabapentin 100 MG capsule Commonly known as: NEURONTIN   isosorbide mononitrate 120 MG 24 hr tablet Commonly known as: IMDUR   NovoLOG FlexPen 100 UNIT/ML FlexPen Generic drug: insulin aspart   torsemide 20 MG tablet Commonly known as: DEMADEX       TAKE these medications    acetaminophen 325 MG tablet Commonly known as: TYLENOL Take 2 tablets (650 mg total) by mouth every 6 (six) hours as needed for mild pain (or Fever >/= 101).   albuterol 108 (90 Base) MCG/ACT inhaler Commonly known as: VENTOLIN HFA Inhale 2 puffs into the lungs every 6 (six) hours as needed for wheezing or shortness of breath.   aspirin 81 MG chewable tablet Chew 81 mg by mouth daily.   B-12 PO Take 1 tablet by mouth daily.   CoQ10 50 MG Caps Take 100 mg by mouth daily.   Effient 10 MG Tabs tablet Generic drug: prasugrel TAKE 1 TABLET BY MOUTH DAILY   escitalopram 10 MG tablet Commonly known as: LEXAPRO Take 1 tablet (10 mg total) by mouth daily.   Febuxostat 80 MG Tabs Commonly known as: Uloric Take 1 tablet (80 mg total) by mouth every morning. NEEDS FOLLOW UP APPOINTMENT FOR ANYMORE REFILLS   fenofibrate 160 MG tablet Take 160 mg by mouth daily.   hydrocerin Crea Apply 1 Application topically 2 (two) times daily.   hydrOXYzine 25 MG tablet Commonly known as: ATARAX Take 1 tablet (25 mg total) by mouth 3 (three) times daily as needed for itching.   insulin lispro 100 UNIT/ML KwikPen Commonly known as: HumaLOG KwikPen Before each meal  3 times a day, 140-199 - 2 units, 200-250 - 4 units, 251-299 - 6 units,  300-349  - 8 units,  350 or above 10 units. Insulin PEN if approved, provide syringes and needles if needed.Please switch to any approved short acting Insulin if needed.   LORazepam 1 MG tablet Commonly known as: Ativan Take 0.5 tablets (0.5 mg total) by mouth 2 (two) times daily.   metoprolol tartrate 25 MG tablet Commonly known as: LOPRESSOR Take 1 tablet (25 mg total) by mouth 2 (two) times daily.   midodrine 10 MG tablet Commonly known as: PROAMATINE Take 1 tablet (10 mg total) by mouth 3 (three) times daily with meals.   nitroGLYCERIN 0.4 MG SL tablet Commonly known as: Nitrostat Place 1 tablet (0.4 mg total) under the tongue every 5 (five) minutes as needed for chest pain.   oxyCODONE 5 MG immediate release tablet Commonly known as: Oxy IR/ROXICODONE Take 1 tablet (5 mg total) by mouth every 6 (six) hours as needed for moderate pain or breakthrough pain. What changed:  medication strength how much to take when to take this reasons to take this   polyethylene glycol 17 g packet Commonly known as: MIRALAX / GLYCOLAX Take 17 g by mouth daily as needed for mild constipation.   ranolazine 500 MG 12 hr tablet Commonly known as: RANEXA Take 1 tablet (500 mg total) by mouth 2 (two) times daily. NEEDS FOLLOW UP APPOINTMENT FOR ANYMORE REFILLS What changed: how much to take   rosuvastatin 5 MG tablet Commonly known as: Crestor TAKE ONE TABLET BY MOUTH ONCE DAILY FOR CHOLESTEROL What changed:  how much to take how to take this when to take this additional instructions   sodium chloride 0.65 % Soln nasal spray Commonly known as: OCEAN Place 2 sprays into both nostrils 4 (four) times daily -  before meals and at bedtime.               Discharge Care Instructions  (From admission, onward)           Start     Ordered   02/03/22 0000  Discharge wound care:       Comments: Daily - Wound care to bilateral LEs, including small partial thickness wound to RLE:  Cleanse with  soap and water, rinse and pat dry. Apply Eucerin Cream to legs and feet, but do not apply between digits.  Cover wouind on right LE with folded layer of xeroform gause Kellie Simmering # 294), top with dry gauze. Wrap LEs with Kerlix roll gauze/paper tape. Place feet into Prevalon boots.   02/03/22 1024             Follow-up [facility] in [place]. Call.   Why: For rides to dialysis appointments: Service is available from 8:00 AM to 4:30 PM each weekday. It will cost $4 each way and you have to schedule the transport 48 hours in advance (Reservations for Monday must be received by 12:00 pm the previous Thursday. Reservations for Tuesday must be received by 12:00 pm the previous Friday.). Contact information: [phone]        [facility].   Why: Schedule is Tuesday, Thursday, Saturday with 11:00 chair time.  For first appointment, please arrive at 10:15 to complete paperwork prior to treatment. Contact information: [place], [place] [address]  [phone]        Jacklynn Ganong, MD. Schedule an  appointment as soon as possible for a visit in 1 week(s).   Specialty: Family Medicine Contact information: [address] Kristeen Mans [address] [place] [address] [phone]                 Major procedures and Radiology Reports - PLEASE review detailed and final reports thoroughly  -       DG Chest [facility] 1 View  Result Date: 02/02/2022 CLINICAL DATA:  Shortness of breath. EXAM: PORTABLE CHEST 1 VIEW COMPARISON:  January 24, 2022. FINDINGS: Stable cardiomegaly. Status post coronary bypass graft. Mild central pulmonary vascular congestion is noted. No consolidative process is noted. Right internal jugular CT dialysis catheter is again noted. Bony thorax is unremarkable. IMPRESSION: Stable cardiomegaly with mild central pulmonary vascular congestion. Electronically Signed   By: Marijo Conception M.D.   On:  02/02/2022 16:30   VAS [place] UPPER EXT VEIN MAPPING (PRE-OP AVF)  Result Date: 01/26/2022 UPPER EXTREMITY VEIN MAPPING Patient Name:  SHELLSEA BORUNDA  Date of Exam:   01/26/2022 Medical Rec #: [id number]       Accession #:    [id number] Date of Birth: Apr 26, 1965        Patient Gender: F Patient Age:   56 years Exam Location:  [facility] Procedure:      VAS [place] UPPER EXT VEIN MAPPING (PRE-OP AVF) Referring Phys: EMMA COLLINS --------------------------------------------------------------------------------  Indications: Pre-access. Comparison Study: No previous exams Performing Technologist: Jody Hill RVT, RDMS  Examination Guidelines: A complete evaluation includes B-mode imaging, spectral Doppler, color Doppler, and power Doppler as needed of all accessible portions of each vessel. Bilateral testing is considered an integral part of a complete examination. Limited examinations for reoccurring indications may be performed as noted. +-----------------+-------------+----------+--------+ Right Cephalic   Diameter (cm)Depth (cm)Findings +-----------------+-------------+----------+--------+ Shoulder             0.36        1.37            +-----------------+-------------+----------+--------+ Prox upper arm       0.38        0.88            +-----------------+-------------+----------+--------+ Mid upper arm        0.35        1.01            +-----------------+-------------+----------+--------+ Dist upper arm       0.30        0.80            +-----------------+-------------+----------+--------+ Antecubital fossa    0.40        0.64            +-----------------+-------------+----------+--------+ Prox forearm         0.24        0.73            +-----------------+-------------+----------+--------+ Mid forearm          0.19        0.54            +-----------------+-------------+----------+--------+ Dist forearm         0.19        0.23             +-----------------+-------------+----------+--------+ Wrist                0.18        0.21            +-----------------+-------------+----------+--------+ +-----------------+-------------+----------+--------------+ Right Basilic    Diameter (cm)Depth (  cm)   Findings    +-----------------+-------------+----------+--------------+ Prox upper arm       0.51                              +-----------------+-------------+----------+--------------+ Mid upper arm        0.42                              +-----------------+-------------+----------+--------------+ Dist upper arm       0.43                              +-----------------+-------------+----------+--------------+ Antecubital fossa    0.36                              +-----------------+-------------+----------+--------------+ Prox forearm                            not visualized +-----------------+-------------+----------+--------------+ Mid forearm                             not visualized +-----------------+-------------+----------+--------------+ Distal forearm                          not visualized +-----------------+-------------+----------+--------------+ Wrist                                   not visualized +-----------------+-------------+----------+--------------+ +-----------------+-------------+----------+-------------------------------+ Left Cephalic    Diameter (cm)Depth (cm)           Findings             +-----------------+-------------+----------+-------------------------------+ Shoulder             0.38        2.23                                   +-----------------+-------------+----------+-------------------------------+ Prox upper arm       0.48        2.76                                   +-----------------+-------------+----------+-------------------------------+ Mid upper arm        0.45        2.05                                    +-----------------+-------------+----------+-------------------------------+ Dist upper arm       0.44        1.22                                   +-----------------+-------------+----------+-------------------------------+ Antecubital fossa    0.41        1.01              branching            +-----------------+-------------+----------+-------------------------------+ Prox forearm  not visualized and IV placement +-----------------+-------------+----------+-------------------------------+ Mid forearm          0.28        1.46                                   +-----------------+-------------+----------+-------------------------------+ Dist forearm         0.27        0.51                                   +-----------------+-------------+----------+-------------------------------+ Wrist                0.12        0.20                                   +-----------------+-------------+----------+-------------------------------+ +-----------------+-------------+----------+--------------+ Left Basilic     Diameter (cm)Depth (cm)   Findings    +-----------------+-------------+----------+--------------+ Prox upper arm       0.31                              +-----------------+-------------+----------+--------------+ Mid upper arm        0.26                              +-----------------+-------------+----------+--------------+ Dist upper arm       0.28                              +-----------------+-------------+----------+--------------+ Antecubital fossa    0.29                              +-----------------+-------------+----------+--------------+ Prox forearm         0.21                 branching    +-----------------+-------------+----------+--------------+ Mid forearm          0.20                              +-----------------+-------------+----------+--------------+ Distal forearm       0.15                               +-----------------+-------------+----------+--------------+ Wrist                                   not visualized +-----------------+-------------+----------+--------------+ *See table(s) above for measurements and observations.  Diagnosing physician: Servando Snare MD Electronically signed by Servando Snare MD on 01/26/2022 at 6:02:59 PM.    Final    DG Foot Complete Right  Result Date: 01/25/2022 CLINICAL DATA:  Ulcer of right foot limited to breakdown of skin. EXAM: RIGHT FOOT COMPLETE - 3+ VIEW COMPARISON:  Radiograph 12/17/2021, foot MRI 12/20/2021. 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 There is section margins are similar to prior exam without frank erosive change. There is some heterotopic calcification developing. No erosive or bony destructive  change. There is no tracking soft tissue gas or radiopaque foreign body. IMPRESSION: 1. Postsurgical change of the first toe and distal metatarsal. No radiographic findings of osteomyelitis. 2. 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 Electronically Signed   By: Keith Rake M.D.   On: 01/25/2022 17:14   DG Chest Port 1 View  Result Date: 01/24/2022 CLINICAL DATA:  Shortness of breath EXAM: PORTABLE CHEST 1 VIEW COMPARISON:  Two days ago FINDINGS: Cardiomegaly. CABG and venous graft stenting. Dialysis catheter on the right. Vascular congestion without Kerley lines, effusion, or pneumothorax. No focal pneumonia IMPRESSION: Cardiomegaly and vascular congestion. Electronically Signed   By: Jorje Guild M.D.   On: 01/24/2022 08:33   DG Chest 2 View  Result Date: 01/22/2022 CLINICAL DATA:  Dyspnea screen for TB EXAM: CHEST - 2 VIEW COMPARISON:  10/02/2018 FINDINGS: Post sternotomy changes. Right-sided central venous catheter tip over the SVC. Cardiomegaly with vascular congestion, pulmonary edema and small pleural effusions. No focal consolidation. IMPRESSION: Cardiomegaly with vascular  congestion, pulmonary edema and small pleural effusions. No specific findings to suggest active TB. Electronically Signed   By: Donavan Foil M.D.   On: 01/22/2022 19:21   DG Abd 1 View  Result Date: 01/20/2022 CLINICAL DATA:  Constipation EXAM: ABDOMEN - 1 VIEW COMPARISON:  Abdominal series 07/26/2013 FINDINGS: The bowel gas pattern is normal. Stool burden appears average. No suspicious calcifications are seen. Rectal catheter in place. There are peripheral vascular calcifications. No acute osseous findings. IMPRESSION: Nonobstructive bowel gas pattern. Average stool burden. Electronically Signed   By: Ronney Asters M.D.   On: 01/20/2022 20:39   IR Fluoro Guide CV Line Right  Result Date: 01/17/2022 INDICATION: Acute renal failure requiring hemodialysis access EXAM: Tunneled hemodialysis catheter placement MEDICATIONS: Ancef 2 gm IV; The antibiotic was administered within an appropriate time interval prior to skin puncture. ANESTHESIA/SEDATION: None FLUOROSCOPY: Radiation Exposure Index (as provided by the fluoroscopic device): 42 mGy Kerma COMPLICATIONS: None immediate. PROCEDURE: Informed written consent was obtained from the patient after a thorough discussion of the procedural risks, benefits and alternatives. All questions were addressed. Maximal Sterile Barrier Technique was utilized including caps, mask, sterile gowns, sterile gloves, sterile drape, hand hygiene and skin antiseptic. A timeout was performed prior to the initiation of the procedure. The right internal jugular vein was evaluated with ultrasound and shown to be patent. A permanent ultrasound image was obtained and placed in the patient's medical record. Using sterile gel and a sterile probe cover, the right internal jugular vein was entered with a 21 ga needle during real time ultrasound guidance. 0.018 inch guidewire placed and 21 ga needle exchanged for transitional dilator set. Utilizing fluoroscopy, 0.035 inch guidewire advanced  through the dilator without difficulty. Seriel dilation was performed and peel-away sheath was placed. Attention then turned to the right anterior upper chest. Following local lidocaine administration, the hemodialysis catheter was tunneled from the chest wall to the venotomy site. The catheter was inserted through the peel-away sheath. The tip of the catheter was positioned within the right atrium using fluoroscopic guidance. All lumens of the catheter aspirated and flushed well. The dialysis lumens were locked with Heparin. The catheter was secured to the skin with suture. The insertion site was covered with sterile dressing. IMPRESSION: Tunneled 19 cm right IJ hemodialysis catheter is ready for use. Electronically Signed   By: Miachel Roux M.D.   On: 01/17/2022 14:20   IR US Guide Vasc Access Right  Result Date: 01/17/2022 INDICATION: Acute  renal failure requiring hemodialysis access EXAM: Tunneled hemodialysis catheter placement MEDICATIONS: Ancef 2 gm IV; The antibiotic was administered within an appropriate time interval prior to skin puncture. ANESTHESIA/SEDATION: None FLUOROSCOPY: Radiation Exposure Index (as provided by the fluoroscopic device): 42 mGy Kerma COMPLICATIONS: None immediate. PROCEDURE: Informed written consent was obtained from the patient after a thorough discussion of the procedural risks, benefits and alternatives. All questions were addressed. Maximal Sterile Barrier Technique was utilized including caps, mask, sterile gowns, sterile gloves, sterile drape, hand hygiene and skin antiseptic. A timeout was performed prior to the initiation of the procedure. The right internal jugular vein was evaluated with ultrasound and shown to be patent. A permanent ultrasound image was obtained and placed in the patient's medical record. Using sterile gel and a sterile probe cover, the right internal jugular vein was entered with a 21 ga needle during real time ultrasound guidance. 0.018 inch  guidewire placed and 21 ga needle exchanged for transitional dilator set. Utilizing fluoroscopy, 0.035 inch guidewire advanced through the dilator without difficulty. Seriel dilation was performed and peel-away sheath was placed. Attention then turned to the right anterior upper chest. Following local lidocaine administration, the hemodialysis catheter was tunneled from the chest wall to the venotomy site. The catheter was inserted through the peel-away sheath. The tip of the catheter was positioned within the right atrium using fluoroscopic guidance. All lumens of the catheter aspirated and flushed well. The dialysis lumens were locked with Heparin. The catheter was secured to the skin with suture. The insertion site was covered with sterile dressing. IMPRESSION: Tunneled 19 cm right IJ hemodialysis catheter is ready for use. Electronically Signed   By: Miachel Roux M.D.   On: 01/17/2022 14:20   DG CHEST PORT 1 VIEW  Result Date: 01/17/2022 CLINICAL DATA:  Pulmonary edema. EXAM: PORTABLE CHEST 1 VIEW COMPARISON:  01/31/2020. FINDINGS: The heart is enlarged in the mediastinal contour is within normal limits. There is atherosclerotic calcification of the aorta. The pulmonary vasculature is distended. Interstitial prominence is noted bilaterally. No effusion or pneumothorax. Sternotomy wires are present over the midline. IMPRESSION: 1. Cardiomegaly with pulmonary vascular congestion. 2. Interstitial prominence bilaterally, possible edema or infiltrate. Electronically Signed   By: Brett Fairy M.D.   On: 01/17/2022 00:48   IR Removal Tun Cv Cath W/O FL  Result Date: 01/06/2022 INDICATION: Patient with history of acute kidney injury status post tunneled hemodialysis catheter now in renal recovery. Request is for tunneled hemodialysis catheter removal. EXAM: REMOVAL TUNNELED CENTRAL VENOUS CATHETER MEDICATIONS: No antibiotic was indicated for this procedure. Moderate (conscious) sedation was not employed  during this procedure. FLUOROSCOPY TIME:  None COMPLICATIONS: None immediate. PROCEDURE: Informed written consent was obtained from the patient after a thorough discussion of the procedural risks, benefits and alternatives. All questions were addressed. Maximal Sterile Barrier Technique was utilized including caps, mask, sterile gloves, sterile drape, hand hygiene and skin antiseptic. A timeout was performed prior to the initiation of the procedure. The patient's right chest and catheter was prepped and draped in a normal sterile fashion. Heparin was removed from both ports of catheter. 1% lidocaine was used for local anesthesia. Using gentle blunt dissection the cuff of the catheter was exposed and the catheter was removed in it's entirety. Pressure was held till hemostasis was obtained. A sterile dressing was applied. The patient tolerated the procedure well with no immediate complications. IMPRESSION: Successful catheter removal as described above. Read by: Rushie Nyhan, NP Electronically Signed   By: Jerilynn Mages.  Shick  M.D.   On: 01/06/2022 14:02      Today   Subjective    [name] today has no headache,no chest abdominal pain,no new weakness tingling or numbness, feels much better   Objective   Blood pressure (!) 108/49, pulse 89, temperature 99 F (37.2 C), temperature source Oral, resp. rate 14, height '5\' 7"'$  (1.702 m), weight (!) 139.5 kg, SpO2 99 %.   Intake/Output Summary (Last 24 hours) at 02/03/2022 1026 Last data filed at 02/03/2022 0000 Gross per 24 hour  Intake 100 ml  Output 500 ml  Net -400 ml    Exam  Awake Alert, No new F.N deficits, Normal affect Halls.AT,PERRAL Supple Neck, No JVD,   Symmetrical Chest wall movement, Good air movement bilaterally, CTAB RRR,No Gallops, Rubs or new Murmurs,  +ve B.Sounds, Abd Soft, No tenderness,   2+ lower extremity edema, bilateral Unna boots, chronic lichenification in her skin mostly in the lower extremities, right IJ HD catheter  in place site clean.   Data Review   Recent Labs  Lab 01/28/22 0639 01/29/22 0420 01/30/22 0810 02/01/22 0315 02/03/22 0347  WBC 9.9 8.5 9.7 9.4 7.9  HGB 8.7* 8.7* 8.2* 8.5* 9.1*  HCT 26.9* 28.6* 25.7* 27.9* 28.2*  PLT 160 141* 145* 141* 222  MCV 85.4 89.7 87.7 88.0 85.2  MCH 27.6 27.3 28.0 26.8 27.5  MCHC 32.3 30.4 31.9 30.5 32.3  RDW 17.8* 18.0* 18.1* 17.7* 17.3*  LYMPHSABS 2.5 3.0  --   --   --   MONOABS 0.8 0.8  --   --   --   EOSABS 0.2 0.2  --   --   --   BASOSABS 0.1 0.1  --   --   --     Recent Labs  Lab 01/28/22 0639 01/29/22 0420 01/30/22 0248 01/31/22 0200 02/01/22 0315 02/02/22 0312 02/03/22 0347  NA 132*   < > 130* 133* 131* 130* 132*  K 3.8   < > 4.0 3.7 4.2 4.1 4.5  CL 94*   < > 95* 96* 95* 93* 95*  CO2 26   < > '22 26 24 23 25  '$ GLUCOSE 145*   < > 175* 152* 138* 115* 113*  BUN 43*   < > 31* 19 26* 33* 39*  CREATININE 3.96*   < > 3.89* 3.29* 4.14* 4.74* 5.10*  CALCIUM 8.9   < > 9.2 9.0 9.1 9.0 9.0  ALBUMIN 2.5*   < > 2.7* 2.9* 2.7* 2.6* 2.4*  CRP 10.0*  --   --   --   --   --   --   PROCALCITON 0.91  --   --   --   --   --   --   BNP 3,263.3*  --   --   --   --   --   --    < > = values in this interval not displayed.    Total Time in preparing paper work, data evaluation and todays exam - 8 minutes  Lala Lund M.D on 02/03/2022 at 10:26 AM  [facility]

## 2022-02-03 NOTE — Procedures (Signed)
HD Note:  Some information was entered later than the data was gathered due to patient care needs. The stated time with the data is accurate.  Received patient in bed to unit. The dialysis order was for her to come up in a chair.  The chair was delivered to the resident room and transport was notified that she was to come up in the chair.  Alert and oriented.  Informed consent signed and in chart.   Patient tolerated well. She expressed pain after treatment started and medication was given, see MAR.   At the end of treatment she stated that she got minimal pain relief from the medication.  She understood that the medication would wash out during treatment. The machine clotted 13 min prior to the end of treatment.  Dr. Jonnie Finner called and stated to end treatment.  Transported back to the room   Hand-off given to patient's nurse.   Access used: HD catheter Access issues: Blood flow rate was decreased to 350 due to issues with the catheter flow.  The lines were reversed and flushed several times.  Total UF removed: 2100   [facility]

## 2022-02-03 NOTE — Discharge Instructions (Signed)
Follow with Primary MD Jacklynn Ganong, MD in 7 days   Get CBC, CMP, 2 view Chest X ray -  checked next visit with your primary MD or SNF MD    Activity: As tolerated with Full fall precautions use walker/cane & assistance as needed  Disposition SNF  Diet: Renal diet with 1.2 L fluid restriction per day  Special Instructions: If you have smoked or chewed Tobacco  in the last 2 yrs please stop smoking, stop any regular Alcohol  and or any Recreational drug use.  On your next visit with your primary care physician please Get Medicines reviewed and adjusted.  Please request your Prim.MD to go over all Hospital Tests and Procedure/Radiological results at the follow up, please get all Hospital records sent to your Prim MD by signing hospital release before you go home.  If you experience worsening of your admission symptoms, develop shortness of breath, life threatening emergency, suicidal or homicidal thoughts you must seek medical attention immediately by calling 911 or calling your MD immediately  if symptoms less severe.  You Must read complete instructions/literature along with all the possible adverse reactions/side effects for all the Medicines you take and that have been prescribed to you. Take any new Medicines after you have completely understood and accpet all the possible adverse reactions/side effects.

## 2022-02-03 NOTE — Progress Notes (Addendum)
Spoke to [place] this am to inquire about status of pt's acceptance/schedule. Awaiting response from clinic with final schedule. Clinic advised pt is ready for d/c today. Update provided to CSW.   Melven Sartorius Renal Navigator [phone]  Addendum at 12:35 pm: Pt has been accepted at [facility] on MWF 10:45 chair time. Pt can start tomorrow and will need to arrive at 10:00 to complete paperwork prior to treatment. Update provided to CSW and nephrologist. Also contacted renal PA to request that orders be sent to clinic for tomorrow. Out-pt HD info added to pt's AVS and will attempt to meet with pt once she returns to her room after HD. Clinic aware pt will d/c today and start tomorrow.   Addendum at 2:30 pm: Met with pt at bedside to go over out-pt HD schedule at d/c. Schedule letter provided as well. Pt agreeable to navigator calling pt's son to go over out-pt HD schedule. Called son and unable to leave a voicemail. Sent a HIPAA compliant text to son requesting he call back when able. Attending and nephrologist feel pt is appropriate for out-pt HD in chair at d/c.

## 2022-02-03 NOTE — TOC Transition Note (Signed)
Transition of Care [facility]) - CM/SW Discharge Note   Patient Details  Name: [name] MRN: [id number] Date of Birth: 03/15/1966  Transition of Care [facility]) CM/SW Contact:  Benard Halsted, LCSW Phone Number: 02/03/2022, 3:46 PM   Clinical Narrative:    Patient will DC to: [facility] Anticipated DC date: 02/03/22 Family notified: Pt declined Transport by: Corey Harold   Per MD patient ready for DC to [facility]. RN to call report prior to discharge [phone]). RN, patient, patient's family, and facility notified of DC. Discharge Summary and FL2 sent to facility. DC packet on chart. Ambulance transport requested for patient.   CSW will sign off for now as social work intervention is no longer needed. Please consult [place] again if new needs arise.     Final next level of care: Skilled Nursing Facility Barriers to Discharge: Barriers Resolved   Patient Goals and CMS Choice Patient states their goals for this hospitalization and ongoing recovery are:: Return home CMS Medicare.gov Compare Post Acute Care list provided to:: Patient Choice offered to / list presented to : Patient  Discharge Placement   Existing PASRR number confirmed : 02/03/22          Patient chooses bed at: [facility] Patient to be transferred to facility by: [place] Name of family member notified: Pt notified family Patient and family notified of of transfer: 02/03/22  Discharge Plan and Services                                     Social Determinants of Health (SDOH) Interventions     Readmission Risk Interventions    12/18/2021    2:02 PM 11/05/2021    1:58 PM  Readmission Risk Prevention Plan  Transportation Screening Complete Complete  PCP or Specialist Appt within 5-7 Days  Complete  PCP or Specialist Appt within 3-5 Days Complete   Home Care Screening  Complete  Medication Review (RN CM)  Complete  HRI or Home Care Consult Complete   Social Work Consult for  [place] Planning/Counseling Complete   Palliative Care Screening Not Applicable   Medication Review [employer]) Complete

## 2022-02-03 NOTE — Plan of Care (Signed)
  Problem: Education: Goal: Knowledge of General Education information will improve Description: Including pain rating scale, medication(s)/side effects and non-pharmacologic comfort measures Outcome: Progressing   Problem: Clinical Measurements: Goal: Will remain free from infection Outcome: Progressing Goal: Respiratory complications will improve Outcome: Progressing   Problem: Clinical Measurements: Goal: Respiratory complications will improve Outcome: Progressing   Problem: Nutrition: Goal: Adequate nutrition will be maintained Outcome: Progressing   Problem: Pain Managment: Goal: General experience of comfort will improve Outcome: Progressing   Problem: Fluid Volume: Goal: Ability to maintain a balanced intake and output will improve Outcome: Progressing

## 2022-02-03 NOTE — Progress Notes (Signed)
OT Cancellation Note  Patient Details Name: [name] MRN: [id number] DOB: 12/03/1965   Cancelled Treatment:    Reason Eval/Treat Not Completed: Patient at procedure or test/ unavailable (HD. will attempt later time.)  [facility] 02/03/2022, 9:16 AM Maurie Boettcher, OT/L   Acute OT Clinical Specialist Acute Rehabilitation Services Pager [phone] Office [phone]

## 2022-02-03 NOTE — Progress Notes (Addendum)
[facility] NEPHROLOGY PROGRESS NOTE  Assessment/ Plan: Pt is a 56 y.o. yo female with PMH of DM 2, HTN, HLD, morbid obesity, OSA,CKD 5, CAD/CABG, chronic diastolic CHF presented with altered mental status, lethargy, found to have worsening renal failure and hyperkalemia with fluid overload.  She was recently admitted from 9/27 - 10/17 for right foot diabetic ulcer and was seen by podiatrist and vascular surgeon, treated with antibiotics.  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  First HD on 10/28 after tunnel HD catheter.  It seems like patient has poor insight into dialysis.  Initially clipped to [facility] but now because of SNF placement looking for MWF schedule to [place].  Receiving HD today and tolerating well except soft BP.  Clinically asymptomatic. Seen by vascular surgeon for permanent access, will likely pursue as outpatient.  #Acute metabolic encephalopathy: Concern for uremia in the past.  Today she looks more alert awake than yesterday.  Continue to provide supportive care, PT OT therapy.  # Anemia of CKD: Received IV iron earlier this month.  Continue Aranesp weekly and monitor hemoglobin.  Increase ESA dose.  #CKD-MBD: Calcium and phosphorus level acceptable.  Continue to monitor.  # HTN/volume: Blood pressure soft on midodrine.  Difficult to assess volume status because of body habitus.  Try to UF as tolerated.  I spoke with the patient's son over the phone.  Subjective: Seen and examined at dialysis unit.  Seems like she is tolerating dialysis well.  She is alert awake and able to participate in conversation.  She was talking with her son over the phone and wanted me to discuss as well.  I spoke with her son as well.  Objective Vital signs in last 24 hours: Vitals:   02/03/22 1000 02/03/22 1030 02/03/22 1100 02/03/22 1130  BP: (!) 108/49 (!) 107/58 (!) 105/51 (!) 109/49  Pulse: 89 93 95 95  Resp: '14 16 15 15  '$ Temp:      TempSrc:      SpO2:  99% 97% 97% 100%  Weight:      Height:       Weight change:   Intake/Output Summary (Last 24 hours) at 02/03/2022 1200 Last data filed at 02/03/2022 0000 Gross per 24 hour  Intake 100 ml  Output 500 ml  Net -400 ml       Labs: RENAL PANEL Recent Labs    10/29/21 0500 10/30/21 0500 12/24/21 0653 12/25/21 0044 12/26/21 0034 12/27/21 0050 12/28/21 0044 12/28/21 7673 12/29/21 0258 12/30/21 0148 12/30/21 1829 12/31/21 0429 01/01/22 0032 01/16/22 2304 01/17/22 0347 01/25/22 4193 01/26/22 0421 01/27/22 0435 01/28/22 7902 01/29/22 0420 01/30/22 0248 01/31/22 0200 02/01/22 0315 02/02/22 0312 02/03/22 0347  NA 138   < > 131* 129* 132* 130*   < >  --  132* 131*   < > 137   < > 138   < > 132* 134* 133* 132* 133* 130* 133* 131* 130* 132*  K 4.1   < > 4.0 3.7 3.7 3.9   < >  --  3.8 3.7   < > 3.2*   < > 6.4*   < > 3.7 3.9 3.8 3.8 3.6 4.0 3.7 4.2 4.1 4.5  CL 103   < > 97* 98 98 97*   < >  --  101 98   < > 102   < > 103   < > 95* 95* 95* 94* 96* 95* 96* 95* 93* 95*  CO2 26   < >  19* 18* 21* 19*   < >  --  22 20*   < > 24   < > 23   < > '23 25 24 26 27 22 26 24 23 25  '$ GLUCOSE 169*   < > 96 220* 218* 178*   < >  --  244* 248*   < > 165*   < > 155*   < > 160* 121* 124* 145* 148* 175* 152* 138* 115* 113*  BUN 33*   < > 118* 128* 141* 148*   < >  --  161* 155*   < > 111*   < > 116*   < > 44* 52* 36* 43* 27* 31* 19 26* 33* 39*  CREATININE 2.82*   < > 4.50* 4.45* 4.43* 4.64*   < >  --  4.50* 4.16*   < > 2.93*   < > 8.43*   < > 3.25* 3.66* 3.07* 3.96* 3.28* 3.89* 3.29* 4.14* 4.74* 5.10*  CALCIUM 9.3   < > 9.0 8.9 9.0 8.8*   < >  --  8.6* 8.4*   < > 8.4*   < > 8.8*   < > 8.7* 8.9 8.9 8.9 8.9 9.2 9.0 9.1 9.0 9.0  MG 1.8  --  2.2 2.3 2.4 2.2  --  2.2 2.3 2.1  --  1.9  --  2.2  --   --   --   --   --   --   --   --   --   --   --   PHOS  --    < > 5.7* 6.5* 5.6* 5.3*   < >  --  5.4* 4.8*   < > 3.2   < > 7.8*   < > 3.5 3.9 3.2 3.4 2.9 3.4 3.0 4.0 4.3 4.6  ALBUMIN  --    < > 2.1* 2.1* 2.2*  2.2*   < >  --  1.9* 2.0*   < > 1.8*   < > 2.4*   < > 2.4* 2.3* 2.8* 2.5* 2.5* 2.7* 2.9* 2.7* 2.6* 2.4*   < > = values in this interval not displayed.      Liver Function Tests: Recent Labs  Lab 02/01/22 0315 02/02/22 0312 02/03/22 0347  ALBUMIN 2.7* 2.6* 2.4*    No results for input(s): "LIPASE", "AMYLASE" in the last 168 hours. No results for input(s): "AMMONIA" in the last 168 hours. CBC: Recent Labs    10/27/21 0600 10/28/21 0754 12/23/21 1326 12/23/21 1802 12/25/21 0044 01/28/22 0639 01/29/22 0420 01/30/22 0810 02/01/22 0315 02/03/22 0347  HGB 7.3*   < > 7.9*  --    < > 8.7* 8.7* 8.2* 8.5* 9.1*  MCV 85.7   < > 84.7  --    < > 85.4 89.7 87.7 88.0 85.2  VITAMINB12 534  --   --   --   --   --   --   --   --   --   FOLATE 16.3  --   --   --   --   --   --   --   --   --   FERRITIN 39  --   --  84  --   --   --   --   --   --   TIBC 413  --   --  308  --   --   --   --   --   --  IRON 37  --   --  23*  --   --   --   --   --   --   RETICCTPCT  --   --  4.5*  --   --   --   --   --   --   --    < > = values in this interval not displayed.     Cardiac Enzymes: No results for input(s): "CKTOTAL", "CKMB", "CKMBINDEX", "TROPONINI" in the last 168 hours. CBG: Recent Labs  Lab 02/02/22 1131 02/02/22 1551 02/02/22 2006 02/02/22 2303 02/03/22 0339  GLUCAP 137* 138* 118* 122* 114*     Iron Studies: No results for input(s): "IRON", "TIBC", "TRANSFERRIN", "FERRITIN" in the last 72 hours. Studies/Results: DG Chest Port 1 View  Result Date: 02/02/2022 CLINICAL DATA:  Shortness of breath. EXAM: PORTABLE CHEST 1 VIEW COMPARISON:  January 24, 2022. FINDINGS: Stable cardiomegaly. Status post coronary bypass graft. Mild central pulmonary vascular congestion is noted. No consolidative process is noted. Right internal jugular CT dialysis catheter is again noted. Bony thorax is unremarkable. IMPRESSION: Stable cardiomegaly with mild central pulmonary vascular congestion.  Electronically Signed   By: Marijo Conception M.D.   On: 02/02/2022 16:30    Medications: Infusions:  meropenem (MERREM) IV 500 mg (02/02/22 2300)    Scheduled Medications:  Chlorhexidine Gluconate Cloth  6 each Topical Q0600   clonazepam  0.25 mg Oral QHS   darbepoetin (ARANESP) injection - NON-DIALYSIS  100 mcg Subcutaneous Q Mon-1800   escitalopram  10 mg Oral Daily   heparin  5,000 Units Subcutaneous Q8H   hydrocerin   Topical Daily   insulin aspart  0-9 Units Subcutaneous Q4H   metoprolol tartrate  25 mg Oral BID   midodrine  10 mg Oral TID WC   sodium chloride  2 spray Each Nare TID AC & HS    have reviewed scheduled and prn medications.  Physical Exam: General: More alert awake. Heart:RRR, s1s2 nl Lungs: Distant breath sound, no wheeze Abdomen:soft,  non-distended Extremities: Trace leg edema present Dialysis Access: Right IJ TDC.  [name] 02/03/2022,12:00 PM  LOS: 18 days

## 2022-02-04 ENCOUNTER — Telehealth: Payer: Self-pay | Admitting: Physician Assistant

## 2022-02-04 NOTE — Telephone Encounter (Signed)
-----   Message from Ulyses Amor, [place] sent at 01/26/2022  8:30 AM EST ----- Carlis Abbott access consult patient needs vein mapping and exam in office with Carlis Abbott or PA on Clark's day in 2-3 weeks.  She may be discharged in 1-2 days from [facility]

## 2022-02-04 NOTE — Progress Notes (Signed)
Late Note Entry:  Spoke to pt's son this morning via phone to provide out-pt HD arrangements.   Melven Sartorius Renal Navigator [phone]

## 2022-02-20 ENCOUNTER — Emergency Department (HOSPITAL_COMMUNITY): Payer: Medicare Other

## 2022-02-20 ENCOUNTER — Inpatient Hospital Stay (HOSPITAL_COMMUNITY)
Admission: EM | Admit: 2022-02-20 | Discharge: 2022-03-23 | DRG: 871 | Disposition: E | Payer: Medicare Other | Attending: Pulmonary Disease | Admitting: Pulmonary Disease

## 2022-02-20 ENCOUNTER — Encounter (HOSPITAL_COMMUNITY): Payer: Self-pay | Admitting: Emergency Medicine

## 2022-02-20 ENCOUNTER — Other Ambulatory Visit: Payer: Self-pay

## 2022-02-20 ENCOUNTER — Inpatient Hospital Stay (HOSPITAL_COMMUNITY): Payer: Medicare Other

## 2022-02-20 DIAGNOSIS — I2510 Atherosclerotic heart disease of native coronary artery without angina pectoris: Secondary | ICD-10-CM | POA: Diagnosis present

## 2022-02-20 DIAGNOSIS — G9341 Metabolic encephalopathy: Secondary | ICD-10-CM

## 2022-02-20 DIAGNOSIS — Z881 Allergy status to other antibiotic agents status: Secondary | ICD-10-CM

## 2022-02-20 DIAGNOSIS — F32A Depression, unspecified: Secondary | ICD-10-CM | POA: Diagnosis present

## 2022-02-20 DIAGNOSIS — I878 Other specified disorders of veins: Secondary | ICD-10-CM | POA: Diagnosis present

## 2022-02-20 DIAGNOSIS — K8020 Calculus of gallbladder without cholecystitis without obstruction: Secondary | ICD-10-CM | POA: Diagnosis present

## 2022-02-20 DIAGNOSIS — Z882 Allergy status to sulfonamides status: Secondary | ICD-10-CM

## 2022-02-20 DIAGNOSIS — D631 Anemia in chronic kidney disease: Secondary | ICD-10-CM | POA: Diagnosis present

## 2022-02-20 DIAGNOSIS — N3001 Acute cystitis with hematuria: Principal | ICD-10-CM | POA: Diagnosis present

## 2022-02-20 DIAGNOSIS — I5033 Acute on chronic diastolic (congestive) heart failure: Secondary | ICD-10-CM | POA: Diagnosis not present

## 2022-02-20 DIAGNOSIS — E1151 Type 2 diabetes mellitus with diabetic peripheral angiopathy without gangrene: Secondary | ICD-10-CM | POA: Diagnosis present

## 2022-02-20 DIAGNOSIS — Z6841 Body Mass Index (BMI) 40.0 and over, adult: Secondary | ICD-10-CM | POA: Diagnosis not present

## 2022-02-20 DIAGNOSIS — A419 Sepsis, unspecified organism: Principal | ICD-10-CM | POA: Diagnosis present

## 2022-02-20 DIAGNOSIS — I739 Peripheral vascular disease, unspecified: Secondary | ICD-10-CM | POA: Diagnosis not present

## 2022-02-20 DIAGNOSIS — I252 Old myocardial infarction: Secondary | ICD-10-CM

## 2022-02-20 DIAGNOSIS — I5032 Chronic diastolic (congestive) heart failure: Secondary | ICD-10-CM | POA: Diagnosis not present

## 2022-02-20 DIAGNOSIS — Z91040 Latex allergy status: Secondary | ICD-10-CM

## 2022-02-20 DIAGNOSIS — Z515 Encounter for palliative care: Secondary | ICD-10-CM

## 2022-02-20 DIAGNOSIS — E119 Type 2 diabetes mellitus without complications: Secondary | ICD-10-CM

## 2022-02-20 DIAGNOSIS — N179 Acute kidney failure, unspecified: Secondary | ICD-10-CM | POA: Diagnosis present

## 2022-02-20 DIAGNOSIS — R7401 Elevation of levels of liver transaminase levels: Secondary | ICD-10-CM | POA: Diagnosis present

## 2022-02-20 DIAGNOSIS — K5732 Diverticulitis of large intestine without perforation or abscess without bleeding: Secondary | ICD-10-CM | POA: Diagnosis present

## 2022-02-20 DIAGNOSIS — I872 Venous insufficiency (chronic) (peripheral): Secondary | ICD-10-CM | POA: Diagnosis present

## 2022-02-20 DIAGNOSIS — Z7902 Long term (current) use of antithrombotics/antiplatelets: Secondary | ICD-10-CM

## 2022-02-20 DIAGNOSIS — I5043 Acute on chronic combined systolic (congestive) and diastolic (congestive) heart failure: Secondary | ICD-10-CM

## 2022-02-20 DIAGNOSIS — I4510 Unspecified right bundle-branch block: Secondary | ICD-10-CM | POA: Diagnosis present

## 2022-02-20 DIAGNOSIS — E872 Acidosis, unspecified: Secondary | ICD-10-CM | POA: Insufficient documentation

## 2022-02-20 DIAGNOSIS — Z992 Dependence on renal dialysis: Secondary | ICD-10-CM

## 2022-02-20 DIAGNOSIS — E8889 Other specified metabolic disorders: Secondary | ICD-10-CM | POA: Diagnosis present

## 2022-02-20 DIAGNOSIS — N186 End stage renal disease: Secondary | ICD-10-CM | POA: Diagnosis present

## 2022-02-20 DIAGNOSIS — Z89411 Acquired absence of right great toe: Secondary | ICD-10-CM

## 2022-02-20 DIAGNOSIS — E1122 Type 2 diabetes mellitus with diabetic chronic kidney disease: Secondary | ICD-10-CM | POA: Diagnosis present

## 2022-02-20 DIAGNOSIS — I25810 Atherosclerosis of coronary artery bypass graft(s) without angina pectoris: Secondary | ICD-10-CM | POA: Diagnosis present

## 2022-02-20 DIAGNOSIS — G4733 Obstructive sleep apnea (adult) (pediatric): Secondary | ICD-10-CM | POA: Diagnosis present

## 2022-02-20 DIAGNOSIS — D689 Coagulation defect, unspecified: Secondary | ICD-10-CM | POA: Diagnosis not present

## 2022-02-20 DIAGNOSIS — E440 Moderate protein-calorie malnutrition: Secondary | ICD-10-CM | POA: Diagnosis present

## 2022-02-20 DIAGNOSIS — I132 Hypertensive heart and chronic kidney disease with heart failure and with stage 5 chronic kidney disease, or end stage renal disease: Secondary | ICD-10-CM | POA: Diagnosis present

## 2022-02-20 DIAGNOSIS — R6521 Severe sepsis with septic shock: Secondary | ICD-10-CM | POA: Diagnosis present

## 2022-02-20 DIAGNOSIS — Z7982 Long term (current) use of aspirin: Secondary | ICD-10-CM

## 2022-02-20 DIAGNOSIS — Z8249 Family history of ischemic heart disease and other diseases of the circulatory system: Secondary | ICD-10-CM

## 2022-02-20 DIAGNOSIS — Z885 Allergy status to narcotic agent status: Secondary | ICD-10-CM

## 2022-02-20 DIAGNOSIS — E875 Hyperkalemia: Secondary | ICD-10-CM | POA: Diagnosis present

## 2022-02-20 DIAGNOSIS — R4182 Altered mental status, unspecified: Secondary | ICD-10-CM | POA: Diagnosis present

## 2022-02-20 DIAGNOSIS — R9401 Abnormal electroencephalogram [EEG]: Secondary | ICD-10-CM | POA: Diagnosis present

## 2022-02-20 DIAGNOSIS — Z711 Person with feared health complaint in whom no diagnosis is made: Secondary | ICD-10-CM | POA: Diagnosis not present

## 2022-02-20 DIAGNOSIS — I2582 Chronic total occlusion of coronary artery: Secondary | ICD-10-CM | POA: Diagnosis present

## 2022-02-20 DIAGNOSIS — Z66 Do not resuscitate: Secondary | ICD-10-CM | POA: Diagnosis present

## 2022-02-20 DIAGNOSIS — I9589 Other hypotension: Secondary | ICD-10-CM | POA: Diagnosis present

## 2022-02-20 DIAGNOSIS — R570 Cardiogenic shock: Secondary | ICD-10-CM | POA: Diagnosis present

## 2022-02-20 DIAGNOSIS — Z951 Presence of aortocoronary bypass graft: Secondary | ICD-10-CM

## 2022-02-20 DIAGNOSIS — G9340 Encephalopathy, unspecified: Secondary | ICD-10-CM

## 2022-02-20 DIAGNOSIS — J9621 Acute and chronic respiratory failure with hypoxia: Secondary | ICD-10-CM | POA: Diagnosis not present

## 2022-02-20 DIAGNOSIS — G8929 Other chronic pain: Secondary | ICD-10-CM | POA: Diagnosis present

## 2022-02-20 DIAGNOSIS — Z888 Allergy status to other drugs, medicaments and biological substances status: Secondary | ICD-10-CM

## 2022-02-20 DIAGNOSIS — J9611 Chronic respiratory failure with hypoxia: Secondary | ICD-10-CM | POA: Diagnosis not present

## 2022-02-20 DIAGNOSIS — I5021 Acute systolic (congestive) heart failure: Secondary | ICD-10-CM | POA: Diagnosis not present

## 2022-02-20 DIAGNOSIS — E785 Hyperlipidemia, unspecified: Secondary | ICD-10-CM | POA: Diagnosis present

## 2022-02-20 DIAGNOSIS — Z79899 Other long term (current) drug therapy: Secondary | ICD-10-CM

## 2022-02-20 DIAGNOSIS — D638 Anemia in other chronic diseases classified elsewhere: Secondary | ICD-10-CM | POA: Diagnosis present

## 2022-02-20 DIAGNOSIS — Z789 Other specified health status: Secondary | ICD-10-CM | POA: Diagnosis not present

## 2022-02-20 DIAGNOSIS — I509 Heart failure, unspecified: Secondary | ICD-10-CM | POA: Diagnosis not present

## 2022-02-20 DIAGNOSIS — Z7189 Other specified counseling: Secondary | ICD-10-CM | POA: Diagnosis not present

## 2022-02-20 DIAGNOSIS — L280 Lichen simplex chronicus: Secondary | ICD-10-CM | POA: Diagnosis present

## 2022-02-20 DIAGNOSIS — R609 Edema, unspecified: Secondary | ICD-10-CM | POA: Diagnosis not present

## 2022-02-20 DIAGNOSIS — Z794 Long term (current) use of insulin: Secondary | ICD-10-CM

## 2022-02-20 DIAGNOSIS — N390 Urinary tract infection, site not specified: Secondary | ICD-10-CM | POA: Insufficient documentation

## 2022-02-20 DIAGNOSIS — M109 Gout, unspecified: Secondary | ICD-10-CM | POA: Diagnosis present

## 2022-02-20 LAB — RAPID URINE DRUG SCREEN, HOSP PERFORMED
Amphetamines: NOT DETECTED
Barbiturates: NOT DETECTED
Benzodiazepines: NOT DETECTED
Cocaine: NOT DETECTED
Opiates: NOT DETECTED
Tetrahydrocannabinol: NOT DETECTED

## 2022-02-20 LAB — URINALYSIS, ROUTINE W REFLEX MICROSCOPIC

## 2022-02-20 LAB — BASIC METABOLIC PANEL
Anion gap: 23 — ABNORMAL HIGH (ref 5–15)
BUN: 57 mg/dL — ABNORMAL HIGH (ref 6–20)
CO2: 17 mmol/L — ABNORMAL LOW (ref 22–32)
Calcium: 8.6 mg/dL — ABNORMAL LOW (ref 8.9–10.3)
Chloride: 92 mmol/L — ABNORMAL LOW (ref 98–111)
Creatinine, Ser: 5.11 mg/dL — ABNORMAL HIGH (ref 0.44–1.00)
GFR, Estimated: 9 mL/min — ABNORMAL LOW (ref >60)
Glucose, Bld: 241 mg/dL — ABNORMAL HIGH (ref 70–99)
Potassium: 4.4 mmol/L (ref 3.5–5.1)
Sodium: 132 mmol/L — ABNORMAL LOW (ref 135–145)

## 2022-02-20 LAB — CBC WITH DIFFERENTIAL/PLATELET
Abs Immature Granulocytes: 0.4 10*3/uL — ABNORMAL HIGH (ref 0.00–0.07)
Basophils Absolute: 0 10*3/uL (ref 0.0–0.1)
Basophils Relative: 0 %
Eosinophils Absolute: 0.1 10*3/uL (ref 0.0–0.5)
Eosinophils Relative: 0 %
HCT: 26.1 % — ABNORMAL LOW (ref 36.0–46.0)
Hemoglobin: 8.9 g/dL — ABNORMAL LOW (ref 12.0–15.0)
Immature Granulocytes: 2 %
Lymphocytes Relative: 5 %
Lymphs Abs: 1 10*3/uL (ref 0.7–4.0)
MCH: 27 pg (ref 26.0–34.0)
MCHC: 34.1 g/dL (ref 30.0–36.0)
MCV: 79.1 fL — ABNORMAL LOW (ref 80.0–100.0)
Monocytes Absolute: 1.2 10*3/uL — ABNORMAL HIGH (ref 0.1–1.0)
Monocytes Relative: 6 %
Neutro Abs: 19.2 10*3/uL — ABNORMAL HIGH (ref 1.7–7.7)
Neutrophils Relative %: 87 %
Platelets: 136 10*3/uL — ABNORMAL LOW (ref 150–400)
RBC: 3.3 MIL/uL — ABNORMAL LOW (ref 3.87–5.11)
RDW: 18.9 % — ABNORMAL HIGH (ref 11.5–15.5)
WBC: 21.9 10*3/uL — ABNORMAL HIGH (ref 4.0–10.5)
nRBC: 0.6 % — ABNORMAL HIGH (ref 0.0–0.2)

## 2022-02-20 LAB — CBC
HCT: 27.2 % — ABNORMAL LOW (ref 36.0–46.0)
Hemoglobin: 9.4 g/dL — ABNORMAL LOW (ref 12.0–15.0)
MCH: 27.2 pg (ref 26.0–34.0)
MCHC: 34.6 g/dL (ref 30.0–36.0)
MCV: 78.8 fL — ABNORMAL LOW (ref 80.0–100.0)
Platelets: 153 10*3/uL (ref 150–400)
RBC: 3.45 MIL/uL — ABNORMAL LOW (ref 3.87–5.11)
RDW: 19.2 % — ABNORMAL HIGH (ref 11.5–15.5)
WBC: 24.3 10*3/uL — ABNORMAL HIGH (ref 4.0–10.5)
nRBC: 0.4 % — ABNORMAL HIGH (ref 0.0–0.2)

## 2022-02-20 LAB — I-STAT CHEM 8, ED
BUN: 53 mg/dL — ABNORMAL HIGH (ref 6–20)
Calcium, Ion: 0.98 mmol/L — ABNORMAL LOW (ref 1.15–1.40)
Chloride: 93 mmol/L — ABNORMAL LOW (ref 98–111)
Creatinine, Ser: 5.5 mg/dL — ABNORMAL HIGH (ref 0.44–1.00)
Glucose, Bld: 212 mg/dL — ABNORMAL HIGH (ref 70–99)
HCT: 30 % — ABNORMAL LOW (ref 36.0–46.0)
Hemoglobin: 10.2 g/dL — ABNORMAL LOW (ref 12.0–15.0)
Potassium: 4 mmol/L (ref 3.5–5.1)
Sodium: 131 mmol/L — ABNORMAL LOW (ref 135–145)
TCO2: 21 mmol/L — ABNORMAL LOW (ref 22–32)

## 2022-02-20 LAB — MRSA NEXT GEN BY PCR, NASAL: MRSA by PCR Next Gen: NOT DETECTED

## 2022-02-20 LAB — URINALYSIS, MICROSCOPIC (REFLEX): WBC, UA: >50 WBC/hpf (ref 0–5)

## 2022-02-20 LAB — BLOOD GAS, VENOUS
Acid-base deficit: 2.4 mmol/L — ABNORMAL HIGH (ref 0.0–2.0)
Bicarbonate: 23.2 mmol/L (ref 20.0–28.0)
O2 Saturation: 56.2 %
Patient temperature: 36.5
pCO2, Ven: 41 mmHg — ABNORMAL LOW (ref 44–60)
pH, Ven: 7.36 (ref 7.25–7.43)
pO2, Ven: 34 mmHg (ref 32–45)

## 2022-02-20 LAB — HEPATITIS B SURFACE ANTIGEN: Hepatitis B Surface Ag: NONREACTIVE

## 2022-02-20 LAB — I-STAT VENOUS BLOOD GAS, ED
Acid-base deficit: 2 mmol/L (ref 0.0–2.0)
Bicarbonate: 21.2 mmol/L (ref 20.0–28.0)
Calcium, Ion: 0.98 mmol/L — ABNORMAL LOW (ref 1.15–1.40)
HCT: 30 % — ABNORMAL LOW (ref 36.0–46.0)
Hemoglobin: 10.2 g/dL — ABNORMAL LOW (ref 12.0–15.0)
O2 Saturation: 79 %
Potassium: 4 mmol/L (ref 3.5–5.1)
Sodium: 131 mmol/L — ABNORMAL LOW (ref 135–145)
TCO2: 22 mmol/L (ref 22–32)
pCO2, Ven: 31.6 mmHg — ABNORMAL LOW (ref 44–60)
pH, Ven: 7.434 — ABNORMAL HIGH (ref 7.25–7.43)
pO2, Ven: 41 mmHg (ref 32–45)

## 2022-02-20 LAB — COMPREHENSIVE METABOLIC PANEL
ALT: 51 U/L — ABNORMAL HIGH (ref 0–44)
AST: 141 U/L — ABNORMAL HIGH (ref 15–41)
Albumin: 2 g/dL — ABNORMAL LOW (ref 3.5–5.0)
Alkaline Phosphatase: 112 U/L (ref 38–126)
Anion gap: 21 — ABNORMAL HIGH (ref 5–15)
BUN: 54 mg/dL — ABNORMAL HIGH (ref 6–20)
CO2: 19 mmol/L — ABNORMAL LOW (ref 22–32)
Calcium: 9.4 mg/dL (ref 8.9–10.3)
Chloride: 91 mmol/L — ABNORMAL LOW (ref 98–111)
Creatinine, Ser: 5 mg/dL — ABNORMAL HIGH (ref 0.44–1.00)
GFR, Estimated: 10 mL/min — ABNORMAL LOW (ref >60)
Glucose, Bld: 211 mg/dL — ABNORMAL HIGH (ref 70–99)
Potassium: 4.2 mmol/L (ref 3.5–5.1)
Sodium: 131 mmol/L — ABNORMAL LOW (ref 135–145)
Total Bilirubin: 4.6 mg/dL — ABNORMAL HIGH (ref 0.3–1.2)
Total Protein: 6.7 g/dL (ref 6.5–8.1)

## 2022-02-20 LAB — PROTIME-INR
INR: 2.1 — ABNORMAL HIGH (ref 0.8–1.2)
Prothrombin Time: 23.2 seconds — ABNORMAL HIGH (ref 11.4–15.2)

## 2022-02-20 LAB — PROCALCITONIN: Procalcitonin: 3.68 ng/mL

## 2022-02-20 LAB — AMMONIA: Ammonia: 32 umol/L (ref 9–35)

## 2022-02-20 LAB — GLUCOSE, CAPILLARY
Glucose-Capillary: 250 mg/dL — ABNORMAL HIGH (ref 70–99)
Glucose-Capillary: 249 mg/dL — ABNORMAL HIGH (ref 70–99)

## 2022-02-20 LAB — MAGNESIUM: Magnesium: 2.1 mg/dL (ref 1.7–2.4)

## 2022-02-20 LAB — CBG MONITORING, ED
Glucose-Capillary: 228 mg/dL — ABNORMAL HIGH (ref 70–99)
Glucose-Capillary: 239 mg/dL — ABNORMAL HIGH (ref 70–99)

## 2022-02-20 LAB — CORTISOL: Cortisol, Plasma: 62.8 ug/dL

## 2022-02-20 LAB — LACTIC ACID, PLASMA
Lactic Acid, Venous: 6.1 mmol/L (ref 0.5–1.9)
Lactic Acid, Venous: 5.1 mmol/L (ref 0.5–1.9)

## 2022-02-20 LAB — BRAIN NATRIURETIC PEPTIDE: B Natriuretic Peptide: >4500 pg/mL — ABNORMAL HIGH (ref 0.0–100.0)

## 2022-02-20 MED ORDER — INSULIN ASPART 100 UNIT/ML IJ SOLN
0.0000 [IU] | INTRAMUSCULAR | Status: DC
  Administered 2022-02-20 – 2022-02-21 (×3): 3 [IU] via SUBCUTANEOUS
  Administered 2022-02-21: 1 [IU] via SUBCUTANEOUS
  Administered 2022-02-21: 5 [IU] via SUBCUTANEOUS
  Administered 2022-02-21 (×2): 3 [IU] via SUBCUTANEOUS
  Administered 2022-02-21 – 2022-02-23 (×5): 1 [IU] via SUBCUTANEOUS
  Administered 2022-02-24: 2 [IU] via SUBCUTANEOUS
  Administered 2022-02-24 – 2022-02-25 (×3): 3 [IU] via SUBCUTANEOUS

## 2022-02-20 MED ORDER — CHLORHEXIDINE GLUCONATE CLOTH 2 % EX PADS
6.0000 | MEDICATED_PAD | Freq: Every day | CUTANEOUS | Status: DC
  Administered 2022-02-21: 6 via TOPICAL

## 2022-02-20 MED ORDER — LINEZOLID 600 MG/300ML IV SOLN
600.0000 mg | Freq: Once | INTRAVENOUS | Status: AC
  Administered 2022-02-20: 600 mg via INTRAVENOUS
  Filled 2022-02-20: qty 300

## 2022-02-20 MED ORDER — SODIUM CHLORIDE 0.9 % IV BOLUS
250.0000 mL | Freq: Once | INTRAVENOUS | Status: AC
  Administered 2022-02-20: 250 mL via INTRAVENOUS

## 2022-02-20 MED ORDER — DOCUSATE SODIUM 100 MG PO CAPS: 100.0000 mg | ORAL_CAPSULE | Freq: Two times a day (BID) | ORAL | Status: DC | PRN

## 2022-02-20 MED ORDER — LIDOCAINE HCL (PF) 1 % IJ SOLN: 5.0000 mL | INTRAMUSCULAR | Status: DC | PRN

## 2022-02-20 MED ORDER — HEPARIN SODIUM (PORCINE) 5000 UNIT/ML IJ SOLN
5000.0000 [IU] | Freq: Three times a day (TID) | INTRAMUSCULAR | Status: DC
  Administered 2022-02-20 – 2022-02-23 (×11): 5000 [IU] via SUBCUTANEOUS
  Filled 2022-02-20 (×11): qty 1

## 2022-02-20 MED ORDER — SODIUM CHLORIDE 0.9 % IV SOLN
1.0000 g | Freq: Once | INTRAVENOUS | Status: AC
  Administered 2022-02-20: 1 g via INTRAVENOUS
  Filled 2022-02-20: qty 10

## 2022-02-20 MED ORDER — LIDOCAINE-PRILOCAINE 2.5-2.5 % EX CREA: 1.0000 | TOPICAL_CREAM | CUTANEOUS | Status: DC | PRN

## 2022-02-20 MED ORDER — POLYETHYLENE GLYCOL 3350 17 G PO PACK: 17.0000 g | PACK | Freq: Every day | ORAL | Status: DC | PRN

## 2022-02-20 MED ORDER — HEPARIN SODIUM (PORCINE) 1000 UNIT/ML DIALYSIS
1000.0000 [IU] | INTRAMUSCULAR | Status: DC | PRN
  Filled 2022-02-20: qty 1

## 2022-02-20 MED ORDER — SODIUM CHLORIDE 0.9 % IV SOLN
2.0000 g | Freq: Once | INTRAVENOUS | Status: AC
  Administered 2022-02-20: 2 g via INTRAVENOUS
  Filled 2022-02-20: qty 12.5

## 2022-02-20 MED ORDER — SODIUM CHLORIDE 0.9 % IV SOLN
8.0000 mg/kg | INTRAVENOUS | Status: DC
  Administered 2022-02-20: 750 mg via INTRAVENOUS
  Filled 2022-02-20: qty 15

## 2022-02-20 MED ORDER — SODIUM CHLORIDE 0.9 % IV BOLUS
500.0000 mL | Freq: Once | INTRAVENOUS | Status: AC
  Administered 2022-02-20: 500 mL via INTRAVENOUS

## 2022-02-20 MED ORDER — SODIUM CHLORIDE 0.9 % IV SOLN: INTRAVENOUS | Status: DC | PRN

## 2022-02-20 MED ORDER — NOREPINEPHRINE 4 MG/250ML-% IV SOLN
2.0000 ug/min | INTRAVENOUS | Status: DC
  Administered 2022-02-20: 7 ug/min via INTRAVENOUS
  Administered 2022-02-20: 12 ug/min via INTRAVENOUS
  Filled 2022-02-20 (×2): qty 250

## 2022-02-20 MED ORDER — ALTEPLASE 2 MG IJ SOLR
2.0000 mg | Freq: Once | INTRAMUSCULAR | Status: DC | PRN
  Administered 2022-02-21: 2 mg

## 2022-02-20 MED ORDER — PENTAFLUOROPROP-TETRAFLUOROETH EX AERO: 1.0000 | INHALATION_SPRAY | CUTANEOUS | Status: DC | PRN

## 2022-02-20 MED ORDER — SODIUM CHLORIDE 0.9 % IV SOLN: 250.0000 mL | INTRAVENOUS | Status: DC

## 2022-02-20 MED ORDER — SODIUM CHLORIDE 0.9 % IV SOLN
1.0000 g | INTRAVENOUS | Status: DC
  Filled 2022-02-20: qty 10

## 2022-02-20 MED ORDER — HYDROMORPHONE HCL 1 MG/ML IJ SOLN
0.5000 mg | Freq: Once | INTRAMUSCULAR | Status: AC | PRN
  Administered 2022-02-20: 0.5 mg via INTRAVENOUS
  Filled 2022-02-20: qty 0.5

## 2022-02-20 NOTE — Progress Notes (Signed)
Pharmacy Antibiotic Note  [name] is a 56 y.o. female admitted on 03/20/2022 presenting AMS, concern for sepsis.  Pharmacy has been consulted for cefepime and daptomycin dosing.  Plan: Cefepime 2g IV x 1, then 1g IV q 24h Daptomycin '8mg'$ /kg IV q 48h Monitor iHD schedule, Cx and clinical progression to narrow      Temp (24hrs), Avg:98 F (36.7 C), Min:97.6 F (36.4 C), Max:98.3 F (36.8 C)  Recent Labs  Lab 02/23/2022 1100 03/16/2022 1320  WBC 21.9*  --   CREATININE 5.00* 5.50*  LATICACIDVEN 6.1*  --     CrCl cannot be calculated (Unknown ideal weight.).    Allergies  Allergen Reactions   Bactrim [Sulfamethoxazole-Trimethoprim] Other (See Comments)    Skin peeled off   Diphenhydramine-Acetaminophen Hives   Benadryl [Diphenhydramine] Hives   Cephalosporins Anxiety   Ms Contin [Morphine] Other (See Comments)    Severe somnolence   Tylenol Pm Extra [Diphenhydramine-Apap (Sleep)] Hives   Valium [Diazepam] Hives   Zocor [Simvastatin] Other (See Comments)    Arthralgia    Vancomycin Other (See Comments)    Skin peeling due to one of the following: Vancomycin, flagyl, or rocephin. Vanc seems to be the most likely culprit. ? SJS, interestingly h/o "skin peeled off" to bactrim as well (sounds really suspicious for SJS to me).   Ceftriaxone Rash   Cipro [Ciprofloxacin Hcl] Anxiety   Latex Itching   Levaquin [Levofloxacin] Anxiety    Bertis Ruddy, PharmD, [facility] Clinical Pharmacist ED Pharmacist Phone # [phone] 02/28/2022 3:00 PM

## 2022-02-20 NOTE — Consult Note (Signed)
Date: 03/19/2022               Patient Name:  [name] MRN: [id number]  DOB: 1965-07-18 Age / Sex: 56 y.o., female   PCP: Jacklynn Ganong, MD         Medical Service: Internal Medicine Teaching Service     SUBJECTIVE   Chief Complaint: altered mental status  History of Present Illness:  56 y.o female with DM 2, ESRD on HD, HTN, morbid obesity, CAD/CABG, diastolic CHF.   Patient presents with altered mental status, last known normal 3 days ago. She is typically A&Ox4.  She has been using a wheelchair since her prior hospitalization, but is able to transfer with assistance.   3 days ago, she was having UTI symptoms was given cipro.  She has ESRD but still produces urine. She missed dialysis today, last known HD was 2 days ago.   Bedsores reported during prior hospitalizations. Has had prior hospitalizations for acute metabolic encephalopathy, at least one for uremic encephalopathy.   Patient appears to be chronically ill with multiple co morbidities and  high volume of prolonged hospitalization.  ED Course: Lactic acid 6.1, urine collected for UA was brown and turbid, showed many bacteria, mucus, yeast, and amorphous crystals. WBC of 21.9 and Hgb of 8.9. Chest X-ray concerning for pulmonary edema. EKG with prolonged PR and QT, no STEMI. Patient was started on linezolid as she had prior SJS with vancomycin. BUN 54, 2 weeks ago was in 30s. Nephrology was consulted by the EDP.  Internal medicine teaching service called to evaluate patient for admission.   Patient lying in bed with eyes closed moaning. During exam, patient opened eyes to name but not other verbal stimulus, responded to commends (was able to raise both arms and legs off the bed, grip strength). She was able to state her name with multiple prompts and was speaking incomprehensibly through most of the exam.   While in the room, patient was hypotensive with MAPs in the 86V, diastolics in 78I.   Meds:   Per hospital  discharge from 11/14:  - albuterol prn - aspirin '81mg'$   - B-12 - CoQ10 '100mg'$   - Prasugrel '10mg'$  - escitalopram '10mg'$  Febuxostat '80mg'$  - fenofibrate 160 mg - hydrocerin - hydroxyzine '25mg'$  TID prn - insulin lispro 100 UNIT/ML - Before each meal 3 times a day, 140-199 - 2 units, 200-250 - 4 units, 251-299 - 6 units,  300-349 - 8 units,  350 or above 10 units. - lorazepam 0,5 BID - metoprolol tartrate '25mg'$  BID - midodrine '10mg'$  3 TID - nitroglycerin 0.4 for cp - oxycodone '5mg'$  q6h prn - miralax - ranolazine '500mg'$  BID - rosuvastatin '5mg'$    Past Medical History DM, ESRD on HD, HLD, HTN, Anemia of chronic disease, OSA, Gout, HFpEF, CAD s/p CABG  Social:  Level of Function: uses wheelchair PCP: Laurance Flatten MD Substances: per chart review - no history of substance use  Family History: CAD  Allergies: Allergies as of 03/19/2022 - Review Complete 03/19/2022  Allergen Reaction Noted   Bactrim [sulfamethoxazole-trimethoprim] Other (See Comments) 10/25/2021   Diphenhydramine-acetaminophen Hives 06/26/2012   Benadryl [diphenhydramine] Hives    Cephalosporins Anxiety 09/19/2012   Ms contin [morphine] Other (See Comments)    Tylenol pm extra [diphenhydramine-apap (sleep)] Hives 06/14/2012   Valium [diazepam] Hives    Zocor [simvastatin] Other (See Comments)    Vancomycin Other (See Comments) 01/17/2022   Ceftriaxone Rash 12/19/2021   Cipro [ciprofloxacin hcl] Anxiety    Latex  Itching    Levaquin [levofloxacin] Anxiety 02/24/2010    Review of Systems: A complete ROS was negative except as per HPI.   OBJECTIVE:   Physical Exam: Blood pressure 102/61, pulse 79, temperature 98.3 F (36.8 C), temperature source Rectal, resp. rate (!) 22, weight (!) 137 kg, SpO2 100 %.  Constitutional: ill-appearing, lying in bed moaning and mumbling incoherently HEENT: normocephalic, atraumatic Neck: supple CV: Regular regular rate and rhythm, no murmurs Pulm: No increased work of breathing oon 4L  [place] Abdominal: skin over LLQ indurated, erythematous, non-tender to palpation Neuro: alert and oriented to person, year, not to place or situation. Grip strength normal, no asterixis noted. Skin: pressure ulcers on right flank, right buttox, left mid-back. Bilateral lower extremities discolored, dry and flaky. HD port in right chest with no erythema.   Labs: CBC    Component Value Date/Time   WBC 21.9 (H) 03/19/2022 1100   RBC 3.30 (L) 03/19/2022 1100   HGB 10.2 (L) 03/19/2022 1328   HCT 30.0 (L) 03/17/2022 1328   PLT 136 (L) 03/10/2022 1100   MCV 79.1 (L) 03/07/2022 1100   MCH 27.0 02/23/2022 1100   MCHC 34.1 03/21/2022 1100   RDW 18.9 (H) 03/06/2022 1100   LYMPHSABS 1.0 03/03/2022 1100   MONOABS 1.2 (H) 03/19/2022 1100   EOSABS 0.1 02/26/2022 1100   BASOSABS 0.0 03/20/2022 1100     CMP     Component Value Date/Time   NA 131 (L) 03/11/2022 1328   K 4.0 03/10/2022 1328   CL 93 (L) 02/22/2022 1320   CO2 19 (L) 02/22/2022 1100   GLUCOSE 212 (H) 03/13/2022 1320   BUN 53 (H) 03/06/2022 1320   CREATININE 5.50 (H) 03/01/2022 1320   CALCIUM 9.4 03/20/2022 1100   PROT 6.7 03/04/2022 1100   ALBUMIN 2.0 (L) 03/15/2022 1100   AST 141 (H) 02/24/2022 1100   ALT 51 (H) 03/19/2022 1100   ALKPHOS 112 02/25/2022 1100   BILITOT 4.6 (H) 03/10/2022 1100   GFRNONAA 10 (L) 03/07/2022 1100   GFRAA 33 (L) 06/15/2019 1237    Imaging: CHEST XRAY FINDINGS: Right IJ tunneled hemodialysis catheter with the tip overlying the upper SVC. Stable cardiomegaly. Patient is status post median sternotomy with evidence of prior multivessel CABG. Diffuse bilateral interstitial airspace opacities consistent with pulmonary edema. No pneumothorax.   IMPRESSION: 1. CHF versus volume overload. 2. Stable position of right IJ tunneled hemodialysis catheter.  CT HEAD FINDINGS: Brain: There is mild cerebral atrophy with widening of the extra-axial spaces and ventricular dilatation. There are areas of  decreased attenuation within the white matter tracts of the supratentorial brain, consistent with microvascular disease changes.   Vascular: No hyperdense vessel or unexpected calcification.   Skull: Normal. Negative for fracture or focal lesion.   Sinuses/Orbits: No acute finding.   Other: None.   IMPRESSION: 1. No acute intracranial abnormality. 2. 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   EKG: personally reviewed my interpretation is normal rate, sinus, prolonged PR interval, prolonged QT interval, right BBB. No ST elevations.   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 person living with a history of  DM 2, ESRD on HD, HTN, morbid obesity, CAD/CABG, diastolic CHF, who presented with AMS.  Septic Shock with Lactic Acidosis Acute Metabolic Encephalopathy with uremia Low MAPS and multiple possible sources of infection including UTI, recent HD port, possible abdominal cellulitis, pressure wounds. Pt has a lactic acidosis which is concerning. Together with AMS, consulted PCCM as concern for patient stability, need for high level of monitoring, and possible need for rapid intervention. She missed her HD and CXR shows volume overload. Given low bps, she may be need CRRT for volume removal.  -PCCM consulted for assessment.

## 2022-02-20 NOTE — Consult Note (Signed)
[facility] Nurse Consult Note: Reason for Consult: Consult requested for BLE.  Performed remotely after review of progress notes and photos in the EMR. Pt is familiar to [facility] team from previous admission.  She has generalized edema and scattered areas of red moist full thickness wounds to bilat legs and feet.  Topical treatment orders provided for bedside nurses to perform as follows: Change dressings to BLE Q day as follows: Xeroform gauze over open wounds topped with dry gauze. Both LEs are to be wrapped with Kerlix roll gauze and tape Please re-consult if further assistance is needed.  Thank-you,  Julien Girt MSN, [place], [place], [place], [place]

## 2022-02-20 NOTE — Hospital Course (Addendum)
Encephalopathic x 3 days   Facility baseline is a&ox4  UTI - was on cipro, not getting better. Also wounds on her belly.   Has come in with uremic encephalopathy in the past   Can't hear, ESRD, missed HD today. Nephro consulted   SUBJECTIVE   Chief Complaint: ***  History of Present Illness: ***  ED Course:  Meds:  No outpatient medications have been marked as taking for the 02/21/2022 encounter [facility] Encounter).    Past Medical History  Social:  Lives With: Occupation: Support: Level of Function: PCP: Substances:  Family History: ***  Allergies: Allergies as of 03/22/2022 - Review Complete 03/10/2022  Allergen Reaction Noted   Bactrim [sulfamethoxazole-trimethoprim] Other (See Comments) 10/25/2021   Diphenhydramine-acetaminophen Hives 06/26/2012   Benadryl [diphenhydramine] Hives    Cephalosporins Anxiety 09/19/2012   Ms contin [morphine] Other (See Comments)    Tylenol pm extra [diphenhydramine-apap (sleep)] Hives 06/14/2012   Valium [diazepam] Hives    Zocor [simvastatin] Other (See Comments)    Vancomycin Other (See Comments) 01/17/2022   Ceftriaxone Rash 12/19/2021   Cipro [ciprofloxacin hcl] Anxiety    Latex Itching    Levaquin [levofloxacin] Anxiety 02/24/2010    Review of Systems: A complete ROS was negative except as per HPI.   OBJECTIVE:   Physical Exam: Blood pressure (!) 102/47, pulse 81, temperature 98.3 F (36.8 C), temperature source Rectal, resp. rate (!) 21, SpO2 100 %.  Constitutional: well-appearing *** sitting in ***, in no acute distress HENT: normocephalic atraumatic, mucous membranes moist Eyes: conjunctiva non-erythematous Neck: supple Cardiovascular: regular rate and rhythm, no m/r/g Pulmonary/Chest: normal work of breathing on room air, lungs clear to auscultation bilaterally Abdominal: soft, non-tender, non-distended MSK: normal bulk and tone Neurological: alert & oriented x 3, 5/5 strength in bilateral upper and lower  extremities, normal gait Skin: warm and dry Psych: ***   ASSESSMENT & PLAN:   Assessment & Plan by Problem: Active Problems:   * No active hospital problems. *

## 2022-02-20 NOTE — ED Notes (Signed)
Lab was unable to get a blood culture and Rn was not able to place another IV to get blood culture.Loeffler, Ruffin Frederick Is made aware. Orders were given to go ahead and start antibiotics.

## 2022-02-20 NOTE — H&P (Signed)
NAME:  Georjean Toya, MRN:  [id number], DOB:  [birth year], LOS: 0 ADMISSION DATE:  03/04/2022, CONSULTATION DATE:  03/22/2022 REFERRING MD:  Marda Stalker, CHIEF COMPLAINT:  AMS   History of Present Illness:  Ms. Brasher is a 56y.o. female with a significant PMH for DM2, HTN, HLD, Morbid Obesity, OSA, CKD 5, CAD/CABG, Chronic Diastolic CHF. Patient was brought to [facility] from [facility] where she has been alerted for the last three days. At baseline, she is A/Ox4 and can ambulate with assistance. She was recently diagnosed with a UTI (11/28) and was given Cirpo as well as a gram of Rocephin. She is a on iHD,MWF, and missed her last two sessions, except for today (12/1).   Pertinent labs in the ER showed WBC 21.9, Cr 5.00, AST 141 ALT 51, LA 6.1. UA positive for leukocytes, proteins, and many bacteria. CT Head showed NAICA. Chest Xray showed CHF versus volume overload. Patient did receive Linezolid prior to Blood Cultures being collected.  BC pending.   PCCM consulted for AMS in the setting of septic shock.   Pertinent  Medical History  Unable to obtain d/t AMS.   Significant Hospital Events: Including procedures, antibiotic start and stop dates in addition to other pertinent events   12/1 Admitted   Interim History / Subjective:  Pt is lying in the bed and is unable to communicate. PMH obtained from previous charts.   Objective   Blood pressure 118/87, pulse 80, temperature 98.3 F (36.8 C), temperature source Rectal, resp. rate (!) 22, weight (!) 137 kg, SpO2 100 %.       No intake or output data in the 24 hours ending 02/21/2022 1557 Filed Weights   02/21/2022 1500  Weight: (!) 137 kg    Physical Examination: General: Chronically ill-appearing, morbid obese female in NAD, confused.  HEENT: [place]/AT, anicteric sclera, PERRL, moist mucous membranes. Neuro: Disoriented x4.  Responds to verbal stimuli. Not following commands. Moves all 4 extremities spontaneously. CV: RRR, no  m/g/r. PULM: Breathing even and unlabored on 3L o2. Lung fields diminished bilaterally. GI: Soft, nontender, nondistended. Normoactive bowel sounds. Extremities: Mild LE edema noted. Skin: Warm/dry, Chronic Lichenification of pretibial skin   Resolved Hospital Problem list     Assessment & Plan:  Septic Shock  w/ Lactic Acidosis  Source not clear: Urine, cath or skin. Presume Urinary source  Plan - Fluid resuscitation limited in the setting of CKD5 and CHF - NE titrated to goal MAP - Goal MAP > 65 - Trend WBC, fever curve - F/u Cx data - Started on Daptomycin and Cefepime   Acute Metabolic Encephalopathy secondary to above > complicated by uremia  Plan - Supportive Care  Chronic Hypotension Plan - Holding midodrine in the setting of AMS    Chronic Resp Failure likely secondary to Obesity, Hypoventilation, OSA > questionable appearance of Pulmonary Edema Plan - Supplemental O2  - Pulse Ox O2 Goal >92% - Holding CPAP given AMS - Repeat CXR in AM - NPO  Chronic Diastolic HF CAD s/p CABG 6283 Plan - Strict I/O - Holding Rosuvastatin, Fenofibrate, Ranexa, Effient - will need to review MAR when mentation improves   ESRD Plan - NOT Anuric at baseline - MWF iHD  - Consult Nephro  DM2 Plan - SSI  - CBG q4H  Right Foot Diabetic Ulcer/Lichenification  Plan WOC following    Best Practice (right click and "Reselect all SmartList Selections" daily)   Diet/type: NPO DVT prophylaxis: LMWH GI prophylaxis: PPI Lines: Tunneled  Dialysis Catheter  Foley:  N/A Code Status:  full code Last date of multidisciplinary goals of care discussion '[]'$   Labs   CBC: Recent Labs  Lab 03/20/2022 1100 03/15/2022 1320 03/12/2022 1328  WBC 21.9*  --   --   NEUTROABS 19.2*  --   --   HGB 8.9* 10.2* 10.2*  HCT 26.1* 30.0* 30.0*  MCV 79.1*  --   --   PLT 136*  --   --     Basic Metabolic Panel: Recent Labs  Lab 03/19/2022 1100 03/05/2022 1320 03/09/2022 1328  NA 131* 131* 131*  K  4.2 4.0 4.0  CL 91* 93*  --   CO2 19*  --   --   GLUCOSE 211* 212*  --   BUN 54* 53*  --   CREATININE 5.00* 5.50*  --   CALCIUM 9.4  --   --   MG 2.1  --   --    GFR: Estimated Creatinine Clearance: 16.4 mL/min (A) (by C-G formula based on SCr of 5.5 mg/dL (H)). Recent Labs  Lab 03/16/2022 1100  WBC 21.9*  LATICACIDVEN 6.1*    Liver Function Tests: Recent Labs  Lab 03/13/2022 1100  AST 141*  ALT 51*  ALKPHOS 112  BILITOT 4.6*  PROT 6.7  ALBUMIN 2.0*   No results for input(s): "LIPASE", "AMYLASE" in the last 168 hours. Recent Labs  Lab 03/19/2022 1100  AMMONIA 32    ABG    Component Value Date/Time   PHART 7.35 12/27/2021 0954   PCO2ART 41 12/27/2021 0954   PO2ART 47 (L) 12/27/2021 0954   HCO3 21.2 02/22/2022 1328   TCO2 22 03/03/2022 1328   ACIDBASEDEF 2.0 03/13/2022 1328   O2SAT 79 03/19/2022 1328     Coagulation Profile: Recent Labs  Lab 02/28/2022 1100  INR 2.1*    Cardiac Enzymes: No results for input(s): "CKTOTAL", "CKMB", "CKMBINDEX", "TROPONINI" in the last 168 hours.  HbA1C: Hgb A1c MFr Bld  Date/Time Value Ref Range Status  10/25/2021 01:46 AM 6.9 (H) 4.8 - 5.6 % Final    Comment:    (NOTE) Pre diabetes:          5.7%-6.4%  Diabetes:              >6.4%  Glycemic control for   <7.0% adults with diabetes   06/28/2020 02:42 PM 7.5 (H) 4.8 - 5.6 % Final    Comment:    REPEATED TO VERIFY (NOTE) Pre diabetes:          5.7%-6.4%  Diabetes:              >6.4%  Glycemic control for   <7.0% adults with diabetes     CBG: Recent Labs  Lab 03/16/2022 1214 03/01/2022 1536  GLUCAP 228* 239*    Review of Systems:   ROS limited d/t AMS.   Past Medical History:  She,  has a past medical history of Burn, CAD (coronary artery disease), Depression, Diastolic CHF, chronic ([place]), DM (diabetes mellitus) ([place]), Gout, History of vaginal bleeding, HLD (hyperlipidemia), HTN (hypertension), Iron deficiency anemia, Morbid obesity ([place]), OSA (obstructive  sleep apnea), and Palpitations.   Surgical History:   Past Surgical History:  Procedure Laterality Date   BIOPSY  10/28/2021   Procedure: BIOPSY;  Surgeon: Sharyn Creamer, MD;  Location: [facility] ENDOSCOPY;  Service: Gastroenterology;;   CABG  10/10   x3. SVG-LAD, SVG-D2, and SVG-distal RCA. no LIMA used as it was a small vessel and not  suitable for grafting to LAD. pt presented 1/11 with NSTEMI and was found to have 90% SVG-LAD. underwent PCI with total of 6 drug eluting stent to SVG-PDA and SVG-LAD.    CESAREAN SECTION     COLONOSCOPY WITH PROPOFOL N/A 10/28/2021   Procedure: COLONOSCOPY WITH PROPOFOL;  Surgeon: Sharyn Creamer, MD;  Location: [place];  Service: Gastroenterology;  Laterality: N/A;   CORONARY STENT INTERVENTION N/A 10/11/2018   Procedure: CORONARY STENT INTERVENTION;  Surgeon: Martinique, Jestin Burbach M, MD;  Location: [place] CV LAB;  Service: Cardiovascular;  Laterality: N/A;   ESOPHAGOGASTRODUODENOSCOPY (EGD) WITH PROPOFOL N/A 10/28/2021   Procedure: ESOPHAGOGASTRODUODENOSCOPY (EGD) WITH PROPOFOL;  Surgeon: Sharyn Creamer, MD;  Location: [place];  Service: Gastroenterology;  Laterality: N/A;   IR FLUORO GUIDE CV LINE RIGHT  12/30/2021   IR FLUORO GUIDE CV LINE RIGHT  01/17/2022   IR REMOVAL TUN CV CATH W/O FL  01/06/2022   IR US GUIDE VASC ACCESS RIGHT  12/30/2021   IR US GUIDE VASC ACCESS RIGHT  01/17/2022   LEFT HEART CATH AND CORS/GRAFTS ANGIOGRAPHY N/A 10/27/2021   Procedure: LEFT HEART CATH AND CORS/GRAFTS ANGIOGRAPHY;  Surgeon: Martinique, Meral Geissinger M, MD;  Location: [place] CV LAB;  Service: Cardiovascular;  Laterality: N/A;   POLYPECTOMY  10/28/2021   Procedure: POLYPECTOMY;  Surgeon: Sharyn Creamer, MD;  Location: [facility] ENDOSCOPY;  Service: Gastroenterology;;   RIGHT/LEFT HEART CATH AND CORONARY ANGIOGRAPHY N/A 10/07/2018   Procedure: RIGHT/LEFT HEART CATH AND CORONARY ANGIOGRAPHY;  Surgeon: Larey Dresser, MD;  Location: [place] CV LAB;  Service: Cardiovascular;  Laterality:  N/A;   TUBAL LIGATION       Social History:   reports that she has never smoked. She has never used smokeless tobacco. She reports that she does not drink alcohol and does not use drugs.   Family History:  Her family history includes Coronary artery disease in an other family member.   Allergies Allergies  Allergen Reactions   Bactrim [Sulfamethoxazole-Trimethoprim] Other (See Comments)    Skin peeled off   Diphenhydramine-Acetaminophen Hives   Benadryl [Diphenhydramine] Hives   Cephalosporins Anxiety   Ms Contin [Morphine] Other (See Comments)    Severe somnolence   Tylenol Pm Extra [Diphenhydramine-Apap (Sleep)] Hives   Valium [Diazepam] Hives   Zocor [Simvastatin] Other (See Comments)    Arthralgia    Vancomycin Other (See Comments)    Skin peeling due to one of the following: Vancomycin, flagyl, or rocephin. Vanc seems to be the most likely culprit. ? SJS, interestingly h/o "skin peeled off" to bactrim as well (sounds really suspicious for SJS to me).   Ceftriaxone Rash   Cipro [Ciprofloxacin Hcl] Anxiety   Latex Itching   Levaquin [Levofloxacin] Anxiety     Home Medications  Prior to Admission medications   Medication Sig Start Date End Date Taking? Authorizing Provider  acetaminophen (TYLENOL) 325 MG tablet Take 2 tablets (650 mg total) by mouth every 6 (six) hours as needed for mild pain (or Fever >/= 101). 02/03/22   Thurnell Lose, MD  albuterol (VENTOLIN HFA) 108 (90 Base) MCG/ACT inhaler Inhale 2 puffs into the lungs every 6 (six) hours as needed for wheezing or shortness of breath. 02/01/17   [provider]  aspirin 81 MG chewable tablet Chew 81 mg by mouth daily.    [provider]  Coenzyme Q10 (COQ10) 50 MG CAPS Take 100 mg by mouth daily.    [provider]  Cyanocobalamin (B-12 PO) Take  1 tablet by mouth daily.    [provider]  EFFIENT 10 MG TABS tablet TAKE 1 TABLET BY MOUTH DAILY 12/12/21   Larey Dresser, MD   escitalopram (LEXAPRO) 10 MG tablet Take 1 tablet (10 mg total) by mouth daily. 02/03/22   Thurnell Lose, MD  Febuxostat (ULORIC) 80 MG TABS Take 1 tablet (80 mg total) by mouth every morning. NEEDS FOLLOW UP APPOINTMENT FOR ANYMORE REFILLS 05/13/21   Larey Dresser, MD  fenofibrate 160 MG tablet Take 160 mg by mouth daily.    [provider]  hydrocerin (EUCERIN) CREA Apply 1 Application topically 2 (two) times daily. 02/03/22   Thurnell Lose, MD  hydrOXYzine (ATARAX) 25 MG tablet Take 1 tablet (25 mg total) by mouth 3 (three) times daily as needed for itching. 01/06/22   Mariel Aloe, MD  insulin lispro (HUMALOG KWIKPEN) 100 UNIT/ML KwikPen Before each meal 3 times a day, 140-199 - 2 units, 200-250 - 4 units, 251-299 - 6 units,  300-349 - 8 units,  350 or above 10 units. Insulin PEN if approved, provide syringes and needles if needed.Please switch to any approved short acting Insulin if needed. 02/03/22   Thurnell Lose, MD  LORazepam (ATIVAN) 1 MG tablet Take 0.5 tablets (0.5 mg total) by mouth 2 (two) times daily. 02/03/22 02/03/23  Thurnell Lose, MD  metoprolol tartrate (LOPRESSOR) 25 MG tablet Take 1 tablet (25 mg total) by mouth 2 (two) times daily. 02/03/22   Thurnell Lose, MD  midodrine (PROAMATINE) 10 MG tablet Take 1 tablet (10 mg total) by mouth 3 (three) times daily with meals. 02/03/22   Thurnell Lose, MD  nitroGLYCERIN (NITROSTAT) 0.4 MG SL tablet Place 1 tablet (0.4 mg total) under the tongue every 5 (five) minutes as needed for chest pain. 05/14/11   Larey Dresser, MD  oxyCODONE (OXY IR/ROXICODONE) 5 MG immediate release tablet Take 1 tablet (5 mg total) by mouth every 6 (six) hours as needed for moderate pain or breakthrough pain. 02/03/22   Thurnell Lose, MD  polyethylene glycol (MIRALAX / GLYCOLAX) 17 g packet Take 17 g by mouth daily as needed for mild constipation. 02/03/22   Thurnell Lose, MD  ranolazine (RANEXA) 500 MG 12 hr tablet  Take 1 tablet (500 mg total) by mouth 2 (two) times daily. NEEDS FOLLOW UP APPOINTMENT FOR ANYMORE REFILLS Patient taking differently: Take 1,000 mg by mouth 2 (two) times daily. NEEDS FOLLOW UP APPOINTMENT FOR ANYMORE REFILLS 10/06/21   Larey Dresser, MD  rosuvastatin (CRESTOR) 5 MG tablet TAKE ONE TABLET BY MOUTH ONCE DAILY FOR CHOLESTEROL Patient taking differently: Take 5 mg by mouth at bedtime. For cholesterol 06/08/14   Bensimhon, Shaune Pascal, MD  sodium chloride (OCEAN) 0.65 % SOLN nasal spray Place 2 sprays into both nostrils 4 (four) times daily -  before meals and at bedtime. 02/03/22   Thurnell Lose, MD    Blood Pressure (Abnormal) 117/57   Pulse 83   Temperature 98.3 F (36.8 C) (Rectal)   Respiration (Abnormal) 22   Weight (Abnormal) 137 kg   Oxygen Saturation 100%   Body Mass Index 47.30 kg/m    Exam    General chronically ill appearing 56 year old female. Laying in bed.Confused. Moaning and yelling. HENT NCAT no JVD her MM are dry Pulm clear currently on 4 lpm [place] PCXR w/ vascular congestion and low volume Card rrr Abd soft. Has skin tear llq of  abd. Dressing intact does not look infected Ext chronic scaly appearing venous stasis appearing legs w/ full thickness wounds on legs and feet.  Neuro awake. Will not follow commands. Moves all ext. 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 Presents w/ 3 d h/o AMS initially felt 2/2 UTI. Per report treated w/ Cipro. Missed her M/W iHD due to AMS. Presents to ER now w/ acute encephalopathy and septic shock with lactic acidosis. Source at this point not clear: urine vs derm also has HD cath so bacteremia a possibility was well. Resuscitation efforts limited by hypoxia and volume over load on CXR.    Plan Cultures ordered  Empiric dapto and cefepime (? SJS w/ vanc) 750 ml challenge w/ NaCl Peripheral NE SBP > 100 and or MAP > 60 Sending cortisol Holding oral meds given encephalopathy  Will need to evaluate once able to take POs.  Will admit to ICU     Erick Colace ACNP-BC [place] Pager # [phone] OR # [phone] if no answer Critical care time: 34 min

## 2022-02-20 NOTE — Progress Notes (Signed)
eLink Physician-Brief Progress Note Patient Name: [name] DOB: [birth year] MRN: [id number]   Date of Service  02/24/2022  HPI/Events of Note  Receieved request for Precedex as patient keeps hollering Patient seen moaning, daughter at bedside. When asked what she takes oxycodone for daughter states its for generalized non specific pain.  eICU Interventions  Will give a trial of Dilaudid 0.5 mg x 1 Discussed with bedside RN     Intervention Category Intermediate Interventions: Other: Minor Interventions: Agitation / anxiety - evaluation and management  Judd Lien 03/10/2022, 8:00 PM

## 2022-02-20 NOTE — ED Provider Notes (Signed)
[facility] Provider Note   CSN: [id number] Arrival date & time: 02/22/2022  1040     History Past medical history of hypertension, hyperlipidemia, diabetes, coronary artery disease, OSA, diastolic CHF, ESRD on dialysis Monday Wednesday Friday Chief Complaint  Patient presents with   Altered Mental Status    [name] is a 56 y.o. female.  Presenting with altered mental status.  She comes from [place] [facility].  She reportedly has been altered for the past 3 days.  Baseline is reportedly A and O x 4. She normally is able to ambulate with assistance.  On November 28, she was diagnosed with a UTI and was given Cipro as well as a gram of Rocephin.  She is not anuric at baseline.  She is reportedly not missing any days of dialysis except for today. Has a right HD cath. She is on chronic 3 L of O2  EMS gave her 2 g of calcium gluconate due to wide QRS and right bundle branch block on the way here.  Unable to obtain further history due to AMS     Altered Mental Status Presenting symptoms: confusion        Home Medications Prior to Admission medications   Medication Sig Start Date End Date Taking? Authorizing Provider  acetaminophen (TYLENOL) 325 MG tablet Take 2 tablets (650 mg total) by mouth every 6 (six) hours as needed for mild pain (or Fever >/= 101). 02/03/22   Thurnell Lose, MD  albuterol (VENTOLIN HFA) 108 (90 Base) MCG/ACT inhaler Inhale 2 puffs into the lungs every 6 (six) hours as needed for wheezing or shortness of breath. 02/01/17   [provider]  aspirin 81 MG chewable tablet Chew 81 mg by mouth daily.    [provider]  Coenzyme Q10 (COQ10) 50 MG CAPS Take 100 mg by mouth daily.    [provider]  Cyanocobalamin (B-12 PO) Take 1 tablet by mouth daily.    [provider]  EFFIENT 10 MG TABS tablet TAKE 1 TABLET BY MOUTH DAILY 12/12/21   Larey Dresser, MD  escitalopram  (LEXAPRO) 10 MG tablet Take 1 tablet (10 mg total) by mouth daily. 02/03/22   Thurnell Lose, MD  Febuxostat (ULORIC) 80 MG TABS Take 1 tablet (80 mg total) by mouth every morning. NEEDS FOLLOW UP APPOINTMENT FOR ANYMORE REFILLS 05/13/21   Larey Dresser, MD  fenofibrate 160 MG tablet Take 160 mg by mouth daily.    [provider]  hydrocerin (EUCERIN) CREA Apply 1 Application topically 2 (two) times daily. 02/03/22   Thurnell Lose, MD  hydrOXYzine (ATARAX) 25 MG tablet Take 1 tablet (25 mg total) by mouth 3 (three) times daily as needed for itching. 01/06/22   Mariel Aloe, MD  insulin lispro (HUMALOG KWIKPEN) 100 UNIT/ML KwikPen Before each meal 3 times a day, 140-199 - 2 units, 200-250 - 4 units, 251-299 - 6 units,  300-349 - 8 units,  350 or above 10 units. Insulin PEN if approved, provide syringes and needles if needed.Please switch to any approved short acting Insulin if needed. 02/03/22   Thurnell Lose, MD  LORazepam (ATIVAN) 1 MG tablet Take 0.5 tablets (0.5 mg total) by mouth 2 (two) times daily. 02/03/22 02/03/23  Thurnell Lose, MD  metoprolol tartrate (LOPRESSOR) 25 MG tablet Take 1 tablet (25 mg total) by mouth 2 (two) times daily. 02/03/22   Thurnell Lose, MD  midodrine (PROAMATINE) 10 MG  tablet Take 1 tablet (10 mg total) by mouth 3 (three) times daily with meals. 02/03/22   Thurnell Lose, MD  nitroGLYCERIN (NITROSTAT) 0.4 MG SL tablet Place 1 tablet (0.4 mg total) under the tongue every 5 (five) minutes as needed for chest pain. 05/14/11   Larey Dresser, MD  oxyCODONE (OXY IR/ROXICODONE) 5 MG immediate release tablet Take 1 tablet (5 mg total) by mouth every 6 (six) hours as needed for moderate pain or breakthrough pain. 02/03/22   Thurnell Lose, MD  polyethylene glycol (MIRALAX / GLYCOLAX) 17 g packet Take 17 g by mouth daily as needed for mild constipation. 02/03/22   Thurnell Lose, MD  ranolazine (RANEXA) 500 MG 12 hr tablet Take 1 tablet  (500 mg total) by mouth 2 (two) times daily. NEEDS FOLLOW UP APPOINTMENT FOR ANYMORE REFILLS Patient taking differently: Take 1,000 mg by mouth 2 (two) times daily. NEEDS FOLLOW UP APPOINTMENT FOR ANYMORE REFILLS 10/06/21   Larey Dresser, MD  rosuvastatin (CRESTOR) 5 MG tablet TAKE ONE TABLET BY MOUTH ONCE DAILY FOR CHOLESTEROL Patient taking differently: Take 5 mg by mouth at bedtime. For cholesterol 06/08/14   Bensimhon, Shaune Pascal, MD  sodium chloride (OCEAN) 0.65 % SOLN nasal spray Place 2 sprays into both nostrils 4 (four) times daily -  before meals and at bedtime. 02/03/22   Thurnell Lose, MD      Allergies    Bactrim [sulfamethoxazole-trimethoprim], Diphenhydramine-acetaminophen, Benadryl [diphenhydramine], Cephalosporins, Ms contin [morphine], Tylenol pm extra [diphenhydramine-apap (sleep)], Valium [diazepam], Zocor [simvastatin], Vancomycin, Ceftriaxone, Cipro [ciprofloxacin hcl], Latex, and Levaquin [levofloxacin]    Review of Systems   Review of Systems  Psychiatric/Behavioral:  Positive for confusion.   All other systems reviewed and are negative.   Physical Exam Updated Vital Signs BP (!) 102/47 (BP Location: Left Arm)   Pulse 81   Temp 98.3 F (36.8 C) (Rectal)   Resp (!) 21   SpO2 100%  Physical Exam Vitals and nursing note reviewed.  Constitutional:      General: She is not in acute distress.    Appearance: Normal appearance. She is obese. She is not toxic-appearing or diaphoretic.  HENT:     Head: Normocephalic and atraumatic.     Nose: No nasal deformity.     Mouth/Throat:     Lips: Pink. No lesions.     Mouth: Mucous membranes are moist. No injury, lacerations, oral lesions or angioedema.     Pharynx: Oropharynx is clear. Uvula midline. No pharyngeal swelling, oropharyngeal exudate, posterior oropharyngeal erythema or uvula swelling.  Eyes:     General: Gaze aligned appropriately. No scleral icterus.       Right eye: No discharge.        Left eye: No  discharge.     Conjunctiva/sclera: Conjunctivae normal.     Right eye: Right conjunctiva is not injected. No exudate or hemorrhage.    Left eye: Left conjunctiva is not injected. No exudate or hemorrhage.    Pupils: Pupils are equal, round, and reactive to light.     Comments: Unable to participate in EOM exam  Cardiovascular:     Rate and Rhythm: Normal rate and regular rhythm.     Pulses: Normal pulses.          Radial pulses are 2+ on the right side and 2+ on the left side.       Dorsalis pedis pulses are 2+ on the right side and 2+ on the left side.  Heart sounds: Normal heart sounds, S1 normal and S2 normal. Heart sounds not distant. No murmur heard.    No friction rub. No gallop. No S3 or S4 sounds.  Pulmonary:     Effort: Pulmonary effort is normal. No accessory muscle usage or respiratory distress.     Breath sounds: Normal breath sounds. No stridor. No wheezing, rhonchi or rales.  Chest:     Chest wall: No tenderness.  Abdominal:     General: Abdomen is flat. There is no distension.     Palpations: Abdomen is soft. There is no mass or pulsatile mass.     Tenderness: There is no abdominal tenderness. There is no right CVA tenderness, left CVA tenderness, guarding or rebound.     Hernia: No hernia is present.     Comments: Wound on Right abdomen: it is a skin tear. Does not appear acutely infected  Left side of abdomen with indurated skin. It is not warm to touch, no fluctuance or abscess concern. No crepitus. No skin opening or abnormal drainage.   Musculoskeletal:     Cervical back: No rigidity.     Right lower leg: No edema.     Left lower leg: No edema.     Comments: Chronic lower ext lichenification bilateral.   Skin:    General: Skin is warm.     Coloration: Skin is not jaundiced or pale.     Findings: No bruising, erythema, lesion or rash.  Neurological:     General: No focal deficit present.     Mental Status: She is alert and oriented to person, place, and  time.     GCS: GCS eye subscore is 3. GCS verbal subscore is 4. GCS motor subscore is 6.     Comments: She is oriented to self. Follows very basic commands. She will wiggle toes and squeeze my hands. No focal weakness noted. Speech difficult to understand. She seems very delirious.     Psychiatric:        Mood and Affect: Mood normal.        Behavior: Behavior normal. Behavior is cooperative.     ED Results / Procedures / Treatments   Labs (all labs ordered are listed, but only abnormal results are displayed) Labs Reviewed  CBC WITH DIFFERENTIAL/PLATELET - Abnormal; Notable for the following components:      Result Value   WBC 21.9 (*)    RBC 3.30 (*)    Hemoglobin 8.9 (*)    HCT 26.1 (*)    MCV 79.1 (*)    RDW 18.9 (*)    Platelets 136 (*)    nRBC 0.6 (*)    Neutro Abs 19.2 (*)    Monocytes Absolute 1.2 (*)    Abs Immature Granulocytes 0.40 (*)    All other components within normal limits  COMPREHENSIVE METABOLIC PANEL - Abnormal; Notable for the following components:   Sodium 131 (*)    Chloride 91 (*)    CO2 19 (*)    Glucose, Bld 211 (*)    BUN 54 (*)    Creatinine, Ser 5.00 (*)    Albumin 2.0 (*)    AST 141 (*)    ALT 51 (*)    Total Bilirubin 4.6 (*)    GFR, Estimated 10 (*)    Anion gap 21 (*)    All other components within normal limits  URINALYSIS, ROUTINE W REFLEX MICROSCOPIC - Abnormal; Notable for the following components:   Color, Urine BROWN (*)  APPearance TURBID (*)    Glucose, UA   (*)    Value: TEST NOT REPORTED DUE TO COLOR INTERFERENCE OF URINE PIGMENT   Hgb urine dipstick   (*)    Value: TEST NOT REPORTED DUE TO COLOR INTERFERENCE OF URINE PIGMENT   Bilirubin Urine   (*)    Value: TEST NOT REPORTED DUE TO COLOR INTERFERENCE OF URINE PIGMENT   Ketones, ur   (*)    Value: TEST NOT REPORTED DUE TO COLOR INTERFERENCE OF URINE PIGMENT   Protein, ur   (*)    Value: TEST NOT REPORTED DUE TO COLOR INTERFERENCE OF URINE PIGMENT   Nitrite   (*)     Value: TEST NOT REPORTED DUE TO COLOR INTERFERENCE OF URINE PIGMENT   Leukocytes,Ua   (*)    Value: TEST NOT REPORTED DUE TO COLOR INTERFERENCE OF URINE PIGMENT   All other components within normal limits  LACTIC ACID, PLASMA - Abnormal; Notable for the following components:   Lactic Acid, Venous 6.1 (*)    All other components within normal limits  PROTIME-INR - Abnormal; Notable for the following components:   Prothrombin Time 23.2 (*)    INR 2.1 (*)    All other components within normal limits  URINALYSIS, MICROSCOPIC (REFLEX) - Abnormal; Notable for the following components:   Bacteria, UA MANY (*)    All other components within normal limits  CBG MONITORING, ED - Abnormal; Notable for the following components:   Glucose-Capillary 228 (*)    All other components within normal limits  I-STAT CHEM 8, ED - Abnormal; Notable for the following components:   Sodium 131 (*)    Chloride 93 (*)    BUN 53 (*)    Creatinine, Ser 5.50 (*)    Glucose, Bld 212 (*)    Calcium, Ion 0.98 (*)    TCO2 21 (*)    Hemoglobin 10.2 (*)    HCT 30.0 (*)    All other components within normal limits  I-STAT VENOUS BLOOD GAS, ED - Abnormal; Notable for the following components:   pH, Ven 7.434 (*)    pCO2, Ven 31.6 (*)    Sodium 131 (*)    Calcium, Ion 0.98 (*)    HCT 30.0 (*)    Hemoglobin 10.2 (*)    All other components within normal limits  URINE CULTURE  CULTURE, BLOOD (ROUTINE X 2)  CULTURE, BLOOD (ROUTINE X 2)  AMMONIA  MAGNESIUM  RAPID URINE DRUG SCREEN, HOSP PERFORMED  BLOOD GAS, VENOUS    EKG EKG Interpretation  Date/Time:  Friday February 20 2022 11:00:59 EST Ventricular Rate:  83 PR Interval:  211 QRS Duration: 190 QT Interval:  491 QTC Calculation: 577 R Axis:   263 Text Interpretation: Sinus rhythm Prolonged PR interval Right bundle branch block when compared to prior, similar appeanrce. No STEMI Confirmed by Antony Blackbird [phone]) on 03/08/2022 11:08:35 AM  Radiology CT  Head Wo Contrast  Result Date: 03/14/2022 CLINICAL DATA:  Altered mental status. EXAM: CT HEAD WITHOUT CONTRAST TECHNIQUE: Contiguous axial images were obtained from the base of the skull through the vertex without intravenous contrast. RADIATION DOSE REDUCTION: This exam was performed according to the departmental dose-optimization program which includes automated exposure control, adjustment of the mA and/or kV according to patient size and/or use of iterative reconstruction technique. COMPARISON:  None Available. FINDINGS: Brain: There is mild cerebral atrophy with widening of the extra-axial spaces and ventricular dilatation. There are areas of decreased attenuation  within the white matter tracts of the supratentorial brain, consistent with microvascular disease changes. Vascular: No hyperdense vessel or unexpected calcification. Skull: Normal. Negative for fracture or focal lesion. Sinuses/Orbits: No acute finding. Other: None. IMPRESSION: 1. No acute intracranial abnormality. 2. 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 Electronically Signed   By: Virgina Norfolk M.D.   On: 03/18/2022 12:49   DG Chest Portable 1 View  Result Date: 03/09/2022 CLINICAL DATA:  Altered mental status EXAM: PORTABLE CHEST 1 VIEW COMPARISON:  Prior chest x-ray 02/02/2022 FINDINGS: Right IJ tunneled hemodialysis catheter with the tip overlying the upper SVC. Stable cardiomegaly. Patient is status post median sternotomy with evidence of prior multivessel CABG. Diffuse bilateral interstitial airspace opacities consistent with pulmonary edema. No pneumothorax. IMPRESSION: 1. CHF versus volume overload. 2. Stable position of right IJ tunneled hemodialysis catheter. Electronically Signed   By: Jacqulynn Cadet M.D.   On: 03/07/2022 11:30    Procedures .Critical Care  Performed by: Adolphus Birchwood, PA-C Authorized by: Adolphus Birchwood, PA-C   Critical care provider statement:     Critical care time (minutes):  45   Critical care was necessary to treat or prevent imminent or life-threatening deterioration of the following conditions:  Sepsis   Critical care was time spent personally by me on the following activities:  Blood draw for specimens, development of treatment plan with patient or surrogate, discussions with consultants, discussions with primary provider, evaluation of patient's response to treatment, examination of patient, obtaining history from patient or surrogate, review of old charts, re-evaluation of patient's condition, pulse oximetry, ordering and review of radiographic studies, ordering and review of laboratory studies and ordering and performing treatments and interventions   Care discussed with: admitting provider     This patient was on telemetry or cardiac monitoring during their time in the ED.    Medications Ordered in ED Medications  linezolid (ZYVOX) IVPB 600 mg (600 mg Intravenous New Bag/Given 02/22/2022 1403)  cefTRIAXone (ROCEPHIN) 1 g in sodium chloride 0.9 % 100 mL IVPB (0 g Intravenous Stopped 03/21/2022 1404)    ED Course/ Medical Decision Making/ A&P Clinical Course as of 03/03/2022 1405  Fri Feb 20, 2022  1134 WBC(!): 21.9 [GL]  1134 DG Chest Portable 1 View Pulmonary edema noted on CXR [GL]  1215 Lactic Acid, Venous(!!): 6.1 [GL]  1215 Started on vancomycin and rocephin for suspected infectious etiology [GL]  1257 Plan to use Linazolid instead of vanc due to prior possible steven johnson allergy  [GL]  1335 Consulted with nephrology, dr. Jonnie Finner who will see patient for evaluation. [GL]  1336 Urine appears very infected [GL]  3976 Internal Medicine to accept patient [GL]    Clinical Course User Index [GL] Sherre Poot Adora Fridge, PA-C                           Medical Decision Making Amount and/or Complexity of Data Reviewed Labs: ordered. Decision-making details documented in ED Course. Radiology: ordered. Decision-making details  documented in ED Course.  Risk Prescription drug management. Decision regarding hospitalization.    MDM  This is a 56 y.o. female who presents to the ED with AMS The differential of this patient includes but is not limited to  Electrolyte Abnormality, Hepatic Encephalopathy, Hypertensive Encephalopathy, Hypoglycemia, Hyperglycemia, Opiates, Uremia, Trauma, Toxins, Tumor, Thyrotoxicosis, Infection, Polypharmacy, Psychiatric, Seizure, and Stroke.   Initial Impression  Patient is chronically ill-appearing.  She is currently moaning and  not answering questions appropriately.  She is moving all extremities and does not seem to have any focal neurological deficits.  She is following very simple commands and can tell me her name.  Workup was initiated due to delirium.  I personally ordered, reviewed, and interpreted all laboratory work and imaging and agree with radiologist interpretation. Results interpreted below:  CBC shows leukocytosis to 21.9.  Hemoglobin is 8.9 and stable from baseline.  Platelets down to 136.  CMP shows sodium of 131, glucose 211 with CO2 of 19 and no anion gap.  Creatinine is 5 with BUN of 54 which is stable from baseline.  Calcium is 9.4.  Potassium 4.2.  LFTs are minimally elevated as well as elevated T. bili 4.1.  VBG does not show significant acidosis.  Ammonia is normal.  Urinalysis shows turbid urine with large amount of bacteria.  She had a CT head without contrast done and it showed no acute abnormalities  Chest x-ray was concerning for pulmonary edema and increased fluid  EKG with prolonged PR, no STEMI criteria met, no significant peaked T waves.  QT is prolonged.  Assessment/Plan:  She has a new leukocytosis and significantly elevated lactic acid.  Likely infectious related as she does not appear volume depleted on exam.  Will obtain blood cultures and start on broad-spectrum antibiotics.  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 Possible sources include recently diagnosed UTI and multiple wounds. Wounds do not appear infected so I favor UTI as source. Culture has been sent off. She Did have some elevated LFTs.  She had a benign abdominal exam, so these is likely 2/2 to sepsis, but patient may need consideration of RUQ [place].    She does not appear to have any significant metabolic derangements with normal potassium, glucose 211 without significant acidosis. Creatinine is stable from prior. Mag is normal. She does have some pulmonary edema noted on CXR likely from missing dialysis today. I have consulted nephrology who will see patient since she missed her dialysis session today, but no emergent dialysis needs have been identified.   She will require admission today.  I have discussed case with Internal Medicine group who will evaluate patient for admission.     Charting Requirements Additional history is obtained from:  EMS External Records from outside source obtained and reviewed including: Reviewed recent ED visit paperwork, Reviewed prior admission notes, reviewed prior creat, and other lab values Social Determinants of Health:  none Pertinant PMH that complicates patient's illness: ESRD  Patient Care Problems that were addressed during this visit: - Acute Cystitis with hematuria: Acute illness with systemic symptoms - Encephalopathy: Acute illness with systemic symptoms - ESRD on HD: Chronic illness This patient was maintained on a cardiac monitor/telemetry. I personally viewed and interpreted the cardiac monitor which reveals an underlying rhythm of NSR Medications given in ED: Rocephin, Linezolid Reevaluation of the patient after these medicines showed that the patient stayed the same I have reviewed home medications and made changes accordingly.  Critical Care Interventions: Sepsis, started abx Consultations: Nephrology, Internal Medicine Disposition: Admit  This is a supervised visit with my  attending physician, Dr. Sherry Ruffing. We have discussed this patient and they have altered the plan as needed.  Portions of this note were generated with L[employer]. Dictation errors may occur despite best attempts at proofreading.   Final Clinical Impression(s) / ED Diagnoses Final diagnoses:  Acute cystitis with hematuria  Encephalopathy  ESRD (end stage renal disease) on  dialysis [facility])    Rx / DC Orders ED Discharge Orders     None         Adolphus Birchwood, PA-C 03/01/2022 1406    Tegeler, Gwenyth Allegra, MD 03/04/2022 1515

## 2022-02-20 NOTE — Procedures (Signed)
Arterial Catheter Insertion Procedure Note  Marvalene Barrett  [id number]  December 25, 1965  Date:03/18/2022  Time:6:36 PM    Provider Performing: Clementeen Graham    Procedure: Insertion of Arterial Line [phone]) with US guidance ([id number])   Indication(s) Blood pressure monitoring and/or need for frequent ABGs  Consent Unable to obtain consent due to emergent nature of procedure.  Anesthesia None   Time Out Verified patient identification, verified procedure, site/side was marked, verified correct patient position, special equipment/implants available, medications/allergies/relevant history reviewed, required imaging and test results available.   Sterile Technique Maximal sterile technique including full sterile barrier drape, hand hygiene, sterile gown, sterile gloves, mask, hair covering, sterile ultrasound probe cover (if used).   Procedure Description Area of catheter insertion was cleaned with chlorhexidine and draped in sterile fashion. With real-time ultrasound guidance an arterial catheter was placed into the left  brachial   artery.  Appropriate arterial tracings confirmed on monitor.     Complications/Tolerance None; patient tolerated the procedure well.   EBL Minimal   Specimen(s) None   Erick Colace ACNP-BC [place] Pager # [phone] OR # [phone] if no answer

## 2022-02-20 NOTE — Procedures (Signed)
Central Venous Catheter Insertion Procedure Note  [name]  [id number]  05-Dec-1965  Date:02/26/2022  Time:6:37 PM   Provider Performing:Pete Johnette Abraham Kary Kos   Procedure: Insertion of Non-tunneled Central Venous [phone]) with US guidance ([id number])   Indication(s) Medication administration  Consent Risks of the procedure as well as the alternatives and risks of each were explained to the patient and/or caregiver.  Consent for the procedure was obtained and is signed in the bedside chart  Anesthesia Topical only with 1% lidocaine   Timeout Verified patient identification, verified procedure, site/side was marked, verified correct patient position, special equipment/implants available, medications/allergies/relevant history reviewed, required imaging and test results available.  Sterile Technique Maximal sterile technique was not able to be achieved due to emergent nature of procedure.  Procedure Description Area of catheter insertion was cleaned with chlorhexidine and draped in sterile fashion.  With real-time ultrasound guidance a central venous catheter was placed into the left internal jugular vein. Nonpulsatile blood flow and easy flushing noted in all ports.  The catheter was sutured in place and sterile dressing applied.  Complications/Tolerance None; patient tolerated the procedure well. Chest X-ray is ordered to verify placement for internal jugular or subclavian cannulation.   Chest x-ray is not ordered for femoral cannulation.  EBL Minimal  Specimen(s) None   Erick Colace ACNP-BC [place] Pager # [phone] OR # [phone] if no answer

## 2022-02-20 NOTE — Progress Notes (Cosign Needed Addendum)
Ms. Frappier presented to the ED for altered mental status. Last HD documented was on 11/29 (3hrs). Nephrology service consulted for hemodialysis needs. Attempted to see the patient and noted the critical care team is currently placing a central line. Blood pressure remains stable and notable labs include: SrCr 5.50, BUN 53, K+ 4.0, BNP > 4500, Lactic acid 6.1, Na 131, and Hgb 9.4. Plan to dialyze patient tomorrow 02/21/22. Will then perform an official consult.  HD Orders: [facility] MWF 4hrs BFR 400, DFR 600, EDW 137.5kg (recently lowered) 2K, 2Ca TDC Heparin bolus 5,000 units with HD Micera 112mg IV Q2wks-last dose 02/09/22 Venofer '100mg'$  IV X 10 doses-5th dose given 02/18/22  CTobie Poet NP [facility]

## 2022-02-20 NOTE — ED Notes (Signed)
Lab Called critical Lactic 6.1 at 1202 and Paulita Cradle, PA-C was made aware.

## 2022-02-20 NOTE — ED Triage Notes (Signed)
Pt BIB [facility] from [facility]. EMS reported pt been altered for the last 3 days. She had a UTI on 02/17/22 given Cipro and on Tuesday she received a gram of Rochephin. EMS said they gave her 2 gram of calcium gluconate, because she had a wide QRS she also had a right bundle branch block. She is a hemodialysis, she goes M/W/F, she did not go today. She has a right Hemodialysis cath.. EMS reported they were told her baseline is A & O x4 and she get's up with assist. Patient normally urinate. She is on chronic oxygen at 3 liters.

## 2022-02-21 ENCOUNTER — Inpatient Hospital Stay (HOSPITAL_COMMUNITY): Payer: Medicare Other

## 2022-02-21 DIAGNOSIS — A419 Sepsis, unspecified organism: Secondary | ICD-10-CM | POA: Diagnosis not present

## 2022-02-21 DIAGNOSIS — R6521 Severe sepsis with septic shock: Secondary | ICD-10-CM | POA: Diagnosis not present

## 2022-02-21 LAB — RENAL FUNCTION PANEL
Albumin: 1.8 g/dL — ABNORMAL LOW (ref 3.5–5.0)
Anion gap: 16 — ABNORMAL HIGH (ref 5–15)
BUN: 60 mg/dL — ABNORMAL HIGH (ref 6–20)
CO2: 20 mmol/L — ABNORMAL LOW (ref 22–32)
Calcium: 8 mg/dL — ABNORMAL LOW (ref 8.9–10.3)
Chloride: 96 mmol/L — ABNORMAL LOW (ref 98–111)
Creatinine, Ser: 4.86 mg/dL — ABNORMAL HIGH (ref 0.44–1.00)
GFR, Estimated: 10 mL/min — ABNORMAL LOW (ref >60)
Glucose, Bld: 168 mg/dL — ABNORMAL HIGH (ref 70–99)
Phosphorus: 5.5 mg/dL — ABNORMAL HIGH (ref 2.5–4.6)
Potassium: 4.2 mmol/L (ref 3.5–5.1)
Sodium: 132 mmol/L — ABNORMAL LOW (ref 135–145)

## 2022-02-21 LAB — GLUCOSE, CAPILLARY
Glucose-Capillary: 266 mg/dL — ABNORMAL HIGH (ref 70–99)
Glucose-Capillary: 233 mg/dL — ABNORMAL HIGH (ref 70–99)
Glucose-Capillary: 202 mg/dL — ABNORMAL HIGH (ref 70–99)
Glucose-Capillary: 151 mg/dL — ABNORMAL HIGH (ref 70–99)
Glucose-Capillary: 137 mg/dL — ABNORMAL HIGH (ref 70–99)
Glucose-Capillary: 129 mg/dL — ABNORMAL HIGH (ref 70–99)

## 2022-02-21 LAB — CBC
HCT: 26.2 % — ABNORMAL LOW (ref 36.0–46.0)
Hemoglobin: 9.1 g/dL — ABNORMAL LOW (ref 12.0–15.0)
MCH: 26.6 pg (ref 26.0–34.0)
MCHC: 34.7 g/dL (ref 30.0–36.0)
MCV: 76.6 fL — ABNORMAL LOW (ref 80.0–100.0)
Platelets: 150 10*3/uL (ref 150–400)
RBC: 3.42 MIL/uL — ABNORMAL LOW (ref 3.87–5.11)
RDW: 18.6 % — ABNORMAL HIGH (ref 11.5–15.5)
WBC: 26.2 10*3/uL — ABNORMAL HIGH (ref 4.0–10.5)
nRBC: 0.5 % — ABNORMAL HIGH (ref 0.0–0.2)

## 2022-02-21 LAB — PROCALCITONIN: Procalcitonin: 4 ng/mL

## 2022-02-21 LAB — BASIC METABOLIC PANEL WITH GFR
Anion gap: 16 — ABNORMAL HIGH (ref 5–15)
BUN: 61 mg/dL — ABNORMAL HIGH (ref 6–20)
CO2: 22 mmol/L (ref 22–32)
Calcium: 8.7 mg/dL — ABNORMAL LOW (ref 8.9–10.3)
Chloride: 92 mmol/L — ABNORMAL LOW (ref 98–111)
Creatinine, Ser: 5.26 mg/dL — ABNORMAL HIGH (ref 0.44–1.00)
GFR, Estimated: 9 mL/min — ABNORMAL LOW
Glucose, Bld: 257 mg/dL — ABNORMAL HIGH (ref 70–99)
Potassium: 3.9 mmol/L (ref 3.5–5.1)
Sodium: 130 mmol/L — ABNORMAL LOW (ref 135–145)

## 2022-02-21 LAB — HEPATITIS B SURFACE ANTIBODY, QUANTITATIVE: Hep B S AB Quant (Post): <3.1 m[IU]/mL — ABNORMAL LOW (ref >9.9)

## 2022-02-21 LAB — LACTIC ACID, PLASMA: Lactic Acid, Venous: 2.6 mmol/L (ref 0.5–1.9)

## 2022-02-21 LAB — MAGNESIUM: Magnesium: 2.2 mg/dL (ref 1.7–2.4)

## 2022-02-21 MED ORDER — SODIUM CHLORIDE 0.9 % IV SOLN
500.0000 [IU]/h | INTRAVENOUS | Status: DC
  Administered 2022-02-21 – 2022-02-23 (×4): 500 [IU]/h via INTRAVENOUS_CENTRAL
  Filled 2022-02-21: qty 10000
  Filled 2022-02-21: qty 2
  Filled 2022-02-21 (×2): qty 10000

## 2022-02-21 MED ORDER — SODIUM CHLORIDE 0.9 % IV SOLN
2.0000 g | Freq: Two times a day (BID) | INTRAVENOUS | Status: DC
  Administered 2022-02-21 – 2022-02-25 (×10): 2 g via INTRAVENOUS
  Filled 2022-02-21 (×10): qty 12.5

## 2022-02-21 MED ORDER — DARBEPOETIN ALFA 100 MCG/0.5ML IJ SOSY
100.0000 ug | PREFILLED_SYRINGE | INTRAMUSCULAR | Status: DC
  Administered 2022-02-23: 100 ug via SUBCUTANEOUS
  Filled 2022-02-21: qty 0.5

## 2022-02-21 MED ORDER — PRISMASOL BGK 4/2.5 32-4-2.5 MEQ/L EC SOLN
Status: DC
  Filled 2022-02-21 (×52): qty 5000

## 2022-02-21 MED ORDER — PRISMASOL BGK 4/2.5 32-4-2.5 MEQ/L REPLACEMENT SOLN
Status: DC
  Filled 2022-02-21 (×12): qty 5000

## 2022-02-21 MED ORDER — HEPARIN SODIUM (PORCINE) 1000 UNIT/ML DIALYSIS
1000.0000 [IU] | INTRAMUSCULAR | Status: DC | PRN
  Administered 2022-02-27: 3200 [IU] via INTRAVENOUS_CENTRAL
  Filled 2022-02-21: qty 4
  Filled 2022-02-21: qty 6

## 2022-02-21 MED ORDER — DEXMEDETOMIDINE HCL IN NACL 400 MCG/100ML IV SOLN
0.2000 ug/kg/h | INTRAVENOUS | Status: DC
  Administered 2022-02-21: 0.5 ug/kg/h via INTRAVENOUS
  Administered 2022-02-21: 0.6 ug/kg/h via INTRAVENOUS
  Administered 2022-02-22: 0.5 ug/kg/h via INTRAVENOUS
  Administered 2022-02-22 (×2): 0.8 ug/kg/h via INTRAVENOUS
  Administered 2022-02-22: 0.6 ug/kg/h via INTRAVENOUS
  Administered 2022-02-22: 0.9 ug/kg/h via INTRAVENOUS
  Administered 2022-02-23 (×2): 1 ug/kg/h via INTRAVENOUS
  Administered 2022-02-23: 0.4 ug/kg/h via INTRAVENOUS
  Filled 2022-02-21 (×7): qty 100
  Filled 2022-02-21: qty 200
  Filled 2022-02-21: qty 100

## 2022-02-21 MED ORDER — MIDODRINE HCL 5 MG PO TABS
10.0000 mg | ORAL_TABLET | Freq: Three times a day (TID) | ORAL | Status: DC
  Filled 2022-02-21: qty 2

## 2022-02-21 MED ORDER — HALOPERIDOL LACTATE 5 MG/ML IJ SOLN
5.0000 mg | Freq: Three times a day (TID) | INTRAMUSCULAR | Status: DC | PRN
  Administered 2022-02-21 (×2): 5 mg via INTRAVENOUS
  Filled 2022-02-21 (×2): qty 1

## 2022-02-21 MED ORDER — NOREPINEPHRINE 16 MG/250ML-% IV SOLN: 2.0000 ug/min | INTRAVENOUS | Status: DC

## 2022-02-21 MED ORDER — HEPARIN SODIUM (PORCINE) 1000 UNIT/ML IJ SOLN: 5000.0000 [IU] | Freq: Once | INTRAMUSCULAR | Status: DC

## 2022-02-21 MED ORDER — MIDODRINE HCL 5 MG PO TABS: 10.0000 mg | ORAL_TABLET | Freq: Three times a day (TID) | ORAL | Status: DC

## 2022-02-21 MED ORDER — CHLORHEXIDINE GLUCONATE CLOTH 2 % EX PADS
6.0000 | MEDICATED_PAD | Freq: Every day | CUTANEOUS | Status: DC
  Administered 2022-02-21 – 2022-02-26 (×7): 6 via TOPICAL

## 2022-02-21 MED ORDER — HALOPERIDOL LACTATE 5 MG/ML IJ SOLN
5.0000 mg | Freq: Once | INTRAMUSCULAR | Status: AC
  Administered 2022-02-21: 5 mg via INTRAVENOUS
  Filled 2022-02-21: qty 1

## 2022-02-21 MED ORDER — SODIUM CHLORIDE 0.9 % IV SOLN
6.0000 mg/kg | Freq: Every day | INTRAVENOUS | Status: DC
  Administered 2022-02-22 – 2022-02-24 (×3): 550 mg via INTRAVENOUS
  Filled 2022-02-21 (×4): qty 11

## 2022-02-21 MED ORDER — MORPHINE SULFATE (PF) 2 MG/ML IV SOLN
2.0000 mg | Freq: Four times a day (QID) | INTRAVENOUS | Status: DC | PRN
  Administered 2022-02-21 – 2022-02-23 (×4): 2 mg via INTRAVENOUS
  Filled 2022-02-21 (×5): qty 1

## 2022-02-21 MED ORDER — NOREPINEPHRINE 16 MG/250ML-% IV SOLN
0.0000 ug/min | INTRAVENOUS | Status: DC
  Administered 2022-02-21: 10 ug/min via INTRAVENOUS
  Administered 2022-02-22: 11 ug/min via INTRAVENOUS
  Administered 2022-02-22: 17 ug/min via INTRAVENOUS
  Administered 2022-02-23: 14 ug/min via INTRAVENOUS
  Administered 2022-02-23: 35 ug/min via INTRAVENOUS
  Administered 2022-02-24: 32 ug/min via INTRAVENOUS
  Administered 2022-02-24: 30 ug/min via INTRAVENOUS
  Administered 2022-02-25: 21 ug/min via INTRAVENOUS
  Administered 2022-02-25: 22 ug/min via INTRAVENOUS
  Administered 2022-02-25: 24 ug/min via INTRAVENOUS
  Administered 2022-02-26: 15 ug/min via INTRAVENOUS
  Administered 2022-02-26: 23 ug/min via INTRAVENOUS
  Administered 2022-02-27: 18 ug/min via INTRAVENOUS
  Filled 2022-02-21 (×13): qty 250

## 2022-02-21 NOTE — Progress Notes (Signed)
PHARMACY NOTE:  ANTIMICROBIAL RENAL DOSAGE ADJUSTMENT  Current antimicrobial regimen includes a mismatch between antimicrobial dosage and estimated renal function.  As per policy approved by the Pharmacy & Therapeutics and Medical Executive Committees, the antimicrobial dosage will be adjusted accordingly.  Current antimicrobial dosage:  Cefepime 1g IV q24h and Daptomycin '8mg'$ /kg IV q48h  Indication: septic shock of likely urinary source per CCM  Renal Function:  Estimated Creatinine Clearance: 17 mL/min (A) (by C-G formula based on SCr of 5.26 mg/dL (H)). '[]'$      On intermittent HD, scheduled: '[x]'$      On CRRT    Antimicrobial dosage has been changed to:  cefepime 2g IV q12h and daptomycin '6mg'$ /kg IV q24h  Additional comments: medications adjusted for transition to CRRT   Arturo Morton, PharmD, BCPS Please check AMION for all [place] contact numbers Clinical Pharmacist 02/21/2022 9:21 AM
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
## 2022-02-21 NOTE — Progress Notes (Signed)
NAME:  [name], MRN:  [id number], DOB:  1965/05/18, LOS: 1 ADMISSION DATE:  03/08/2022, CONSULTATION DATE:  03/15/2022 REFERRING MD:  Marda Stalker, CHIEF COMPLAINT:  AMS   History of Present Illness:  Ms. Walpole is a 56y.o. female with a significant PMH for DM2, HTN, HLD, Morbid Obesity, OSA, CKD 5, CAD/CABG, Chronic Diastolic CHF. Patient was brought to [facility] from [facility] where she has been alerted for the last three days. At baseline, she is A/Ox4 and can ambulate with assistance. She was recently diagnosed with a UTI (11/28) and was given Cirpo as well as a gram of Rocephin. She is a on iHD,MWF, and missed her last two sessions, except for today (12/1).   Pertinent labs in the ER showed WBC 21.9, Cr 5.00, AST 141 ALT 51, LA 6.1. UA positive for leukocytes, proteins, and many bacteria. CT Head showed NAICA. Chest Xray showed CHF versus volume overload. Patient did receive Linezolid prior to Blood Cultures being collected.  BC pending.   PCCM consulted for AMS in the setting of septic shock.   Pertinent  Medical History  Unable to obtain d/t AMS.   Significant Hospital Events: Including procedures, antibiotic start and stop dates in addition to other pertinent events   12/1 Admitted  12/1 CVC and arterial catheter placed  Interim History / Subjective:   Received Dilaudid overnight for moaning, generalized pain Norepinephrine 5   Objective   Blood pressure 120/80, pulse 76, temperature 98.4 F (36.9 C), temperature source Oral, resp. rate (!) 8, weight 135.8 kg, SpO2 99 %.        Intake/Output Summary (Last 24 hours) at 02/21/2022 0747 Last data filed at 02/21/2022 0600 Gross per 24 hour  Intake 1794.05 ml  Output --  Net 1794.05 ml   Filed Weights   03/19/2022 1500 02/25/2022 1630 02/21/22 0500  Weight: (!) 137 kg (!) 138 kg 135.8 kg    Physical Examination: General: Obese critically ill-appearing woman laying in bed HEENT: Oropharynx clear, no  secretions or stridor.  Pupils equal Neuro: Will open eyes to voice, track.  Moaning, tries to communicate but unintelligible.  Moves her upper extremities spontaneously.  Did not follow commands CV: Regular, distant, no murmur PULM: Decreased at both bases, no crackles or wheezes GI: Obese, nondistended with positive bowel sounds Extremities: Trace bilateral pretibial lower extremity edema Skin: No rash  Resolved Hospital Problem list     Assessment & Plan:  Septic Shock  w/ Lactic Acidosis  Source not clear: Urine, cath or skin. Presume Urinary source  Plan -Careful with volume resuscitation given her history of renal failure, shoot for euvolemia -Wean norepinephrine as able.  MAP goal 65 -Empiric daptomycin and cefepime -Follow cultures -Follow lactic acid for clearance: 6.1 > 2.6   Acute Metabolic Encephalopathy secondary to above > complicated by uremia  Plan -Careful with sedating medications.  She does have generalized pain, required narcotics overnight -Could consider Precedex depending on how well her mental status clears  Chronic Hypotension Plan -Restart midodrine 12/2, 10 mg 3 times daily  Chronic Resp Failure likely secondary to Obesity, Hypoventilation, OSA > questionable appearance of Pulmonary Edema Plan -Wean oxygen as able -Restart CPAP if mental status improves -Follow chest x-ray  Chronic Diastolic HF CAD s/p CABG 6962 Plan -Careful with aggressive IV fluid resuscitation -Rosuvastatin, fenofibrate, Ranexa, Effient currently on hold  ESRD Plan -Typically receives intermittent HD MWF -Appreciate nephrology assistance in management -Hopefully we will be able to wean pressors to facilitate intermittent  HD  DM2 Plan -Sliding scale insulin as per protocol -CBG goal 140-180  Right Foot Diabetic Ulcer/Lichenification  Plan WOC, appreciate input    Best Practice (right click and "Reselect all SmartList Selections" daily)   Diet/type: NPO DVT  prophylaxis: LMWH GI prophylaxis: PPI Lines: Tunneled Dialysis Catheter  Foley:  N/A Code Status:  full code Last date of multidisciplinary goals of care discussion '[]'$   Labs   CBC: Recent Labs  Lab 02/21/2022 1100 03/18/2022 1320 03/15/2022 1328 03/12/2022 1609 02/21/22 0419  WBC 21.9*  --   --  24.3* 26.2*  NEUTROABS 19.2*  --   --   --   --   HGB 8.9* 10.2* 10.2* 9.4* 9.1*  HCT 26.1* 30.0* 30.0* 27.2* 26.2*  MCV 79.1*  --   --  78.8* 76.6*  PLT 136*  --   --  153 280    Basic Metabolic Panel: Recent Labs  Lab 03/17/2022 1100 03/03/2022 1320 03/20/2022 1328 03/09/2022 1609 02/21/22 0419  NA 131* 131* 131* 132* 130*  K 4.2 4.0 4.0 4.4 3.9  CL 91* 93*  --  92* 92*  CO2 19*  --   --  17* 22  GLUCOSE 211* 212*  --  241* 257*  BUN 54* 53*  --  57* 61*  CREATININE 5.00* 5.50*  --  5.11* 5.26*  CALCIUM 9.4  --   --  8.6* 8.7*  MG 2.1  --   --   --  2.2   GFR: Estimated Creatinine Clearance: 17 mL/min (A) (by C-G formula based on SCr of 5.26 mg/dL (H)). Recent Labs  Lab 03/12/2022 1100 03/13/2022 1609 02/26/2022 2023 02/21/22 0419  PROCALCITON  --  3.68  --  4.00  WBC 21.9* 24.3*  --  26.2*  LATICACIDVEN 6.1*  --  5.1* 2.6*    Liver Function Tests: Recent Labs  Lab 03/01/2022 1100  AST 141*  ALT 51*  ALKPHOS 112  BILITOT 4.6*  PROT 6.7  ALBUMIN 2.0*   No results for input(s): "LIPASE", "AMYLASE" in the last 168 hours. Recent Labs  Lab 03/16/2022 1100  AMMONIA 32    ABG    Component Value Date/Time   PHART 7.35 12/27/2021 0954   PCO2ART 41 12/27/2021 0954   PO2ART 47 (L) 12/27/2021 0954   HCO3 23.2 02/28/2022 2022   TCO2 22 02/28/2022 1328   ACIDBASEDEF 2.4 (H) 02/26/2022 2022   O2SAT 56.2 03/18/2022 2022     Coagulation Profile: Recent Labs  Lab 03/03/2022 1100  INR 2.1*    Cardiac Enzymes: No results for input(s): "CKTOTAL", "CKMB", "CKMBINDEX", "TROPONINI" in the last 168 hours.  HbA1C: Hgb A1c MFr Bld  Date/Time Value Ref Range Status  10/25/2021  01:46 AM 6.9 (H) 4.8 - 5.6 % Final    Comment:    (NOTE) Pre diabetes:          5.7%-6.4%  Diabetes:              >6.4%  Glycemic control for   <7.0% adults with diabetes   06/28/2020 02:42 PM 7.5 (H) 4.8 - 5.6 % Final    Comment:    REPEATED TO VERIFY (NOTE) Pre diabetes:          5.7%-6.4%  Diabetes:              >6.4%  Glycemic control for   <7.0% adults with diabetes     CBG: Recent Labs  Lab 03/05/2022 1214 03/19/2022 1536 02/25/2022 1944 03/03/2022 2324 02/21/22  [place]    Critical care time: 33 min     Baltazar Apo, MD, PhD 02/21/2022, 7:53 AM [facility] [phone] or if no answer before 7:00PM call [phone] For any issues after 7:00PM please call eLink [phone]

## 2022-02-22 DIAGNOSIS — A419 Sepsis, unspecified organism: Secondary | ICD-10-CM | POA: Diagnosis not present

## 2022-02-22 DIAGNOSIS — R6521 Severe sepsis with septic shock: Secondary | ICD-10-CM | POA: Diagnosis not present

## 2022-02-22 LAB — RENAL FUNCTION PANEL
Albumin: 1.7 g/dL — ABNORMAL LOW (ref 3.5–5.0)
Albumin: 1.6 g/dL — ABNORMAL LOW (ref 3.5–5.0)
Anion gap: 14 (ref 5–15)
Anion gap: 13 (ref 5–15)
BUN: 45 mg/dL — ABNORMAL HIGH (ref 6–20)
BUN: 34 mg/dL — ABNORMAL HIGH (ref 6–20)
CO2: 20 mmol/L — ABNORMAL LOW (ref 22–32)
CO2: 21 mmol/L — ABNORMAL LOW (ref 22–32)
Calcium: 8.1 mg/dL — ABNORMAL LOW (ref 8.9–10.3)
Calcium: 8.3 mg/dL — ABNORMAL LOW (ref 8.9–10.3)
Chloride: 100 mmol/L (ref 98–111)
Chloride: 102 mmol/L (ref 98–111)
Creatinine, Ser: 3.65 mg/dL — ABNORMAL HIGH (ref 0.44–1.00)
Creatinine, Ser: 2.8 mg/dL — ABNORMAL HIGH (ref 0.44–1.00)
GFR, Estimated: 14 mL/min — ABNORMAL LOW (ref >60)
GFR, Estimated: 19 mL/min — ABNORMAL LOW (ref >60)
Glucose, Bld: 133 mg/dL — ABNORMAL HIGH (ref 70–99)
Glucose, Bld: 114 mg/dL — ABNORMAL HIGH (ref 70–99)
Phosphorus: 4.5 mg/dL (ref 2.5–4.6)
Phosphorus: 3.6 mg/dL (ref 2.5–4.6)
Potassium: 4.2 mmol/L (ref 3.5–5.1)
Potassium: 4.3 mmol/L (ref 3.5–5.1)
Sodium: 134 mmol/L — ABNORMAL LOW (ref 135–145)
Sodium: 136 mmol/L (ref 135–145)

## 2022-02-22 LAB — CBC
HCT: 27 % — ABNORMAL LOW (ref 36.0–46.0)
Hemoglobin: 9.4 g/dL — ABNORMAL LOW (ref 12.0–15.0)
MCH: 26.6 pg (ref 26.0–34.0)
MCHC: 34.8 g/dL (ref 30.0–36.0)
MCV: 76.5 fL — ABNORMAL LOW (ref 80.0–100.0)
Platelets: 151 10*3/uL (ref 150–400)
RBC: 3.53 MIL/uL — ABNORMAL LOW (ref 3.87–5.11)
RDW: 19.3 % — ABNORMAL HIGH (ref 11.5–15.5)
WBC: 26.3 10*3/uL — ABNORMAL HIGH (ref 4.0–10.5)
nRBC: 1.1 % — ABNORMAL HIGH (ref 0.0–0.2)

## 2022-02-22 LAB — MAGNESIUM: Magnesium: 2.3 mg/dL (ref 1.7–2.4)

## 2022-02-22 LAB — GLUCOSE, CAPILLARY
Glucose-Capillary: 106 mg/dL — ABNORMAL HIGH (ref 70–99)
Glucose-Capillary: 108 mg/dL — ABNORMAL HIGH (ref 70–99)
Glucose-Capillary: 111 mg/dL — ABNORMAL HIGH (ref 70–99)
Glucose-Capillary: 113 mg/dL — ABNORMAL HIGH (ref 70–99)
Glucose-Capillary: 131 mg/dL — ABNORMAL HIGH (ref 70–99)
Glucose-Capillary: 95 mg/dL (ref 70–99)

## 2022-02-22 LAB — APTT: aPTT: 88 seconds — ABNORMAL HIGH (ref 24–36)

## 2022-02-22 LAB — PROCALCITONIN: Procalcitonin: 2.97 ng/mL

## 2022-02-22 LAB — LACTIC ACID, PLASMA: Lactic Acid, Venous: 3.2 mmol/L (ref 0.5–1.9)

## 2022-02-22 MED ORDER — MIDODRINE HCL 5 MG PO TABS: 10.0000 mg | ORAL_TABLET | Freq: Three times a day (TID) | ORAL | Status: DC

## 2022-02-22 MED ORDER — MIDODRINE HCL 5 MG PO TABS
10.0000 mg | ORAL_TABLET | Freq: Three times a day (TID) | ORAL | Status: DC
  Administered 2022-02-22 – 2022-02-26 (×13): 10 mg
  Filled 2022-02-22 (×12): qty 2

## 2022-02-22 MED ORDER — LIP MEDEX EX OINT: TOPICAL_OINTMENT | CUTANEOUS | Status: DC | PRN

## 2022-02-22 NOTE — Progress Notes (Signed)
[facility] Progress Note  Subjective: pt seen in ICU. Net neg 255 cc yest and 1.2 L so far today. Getting levo support at 8 --> 16 ug/min. Labs okay.   Vitals:   02/22/22 1115 02/22/22 1130 02/22/22 1200 02/22/22 1230  BP:      Pulse: 85 85 85 85  Resp: '15 20 16 18  '$ Temp: 98.1 F (36.7 C)     TempSrc: Axillary     SpO2: 100% 100% 90% 100%  Weight:        Exam: Gen lethargic, obese, confused, [place] O2 No rash, cyanosis or gangrene Sclera anicteric, throat clear  No jvd or bruits Chest clear bilat to bases, no rales/ wheezing RRR no MRG Abd soft ntnd no mass or ascites +bs Ext diffuse 2-3+ tight bilat hip > pretib/ abd wall edema Neuro as above, confused, moaning    RIJ TDC in place          Home meds include - albuterol, aspirin, effient, uloric, fenofibrate, insulin lispro, midodrine 10 tid, sl ntg, ranexa, crestor, lexapro, lopressor 25 bid, oxy IR prn, prns/ vits/ supps     CXR - CM and vasc congestion   Creat 5.2  K 3.9  BUn 61  Na 130   WBC 26 K  Hb 9.1     LA 5.1 > 2.6 ,  PC 4.0    BCx's neg x 2 < 12 hrs    BP 96/47, HR 80, RR 10--20  afebrile    OP HD: MWF [place] 4h  400/ 600   137.5kg  2/2 bath  RIJ TDC   Hep 5000x 1 - venofer 100 tiw IV thru 12/11 - mircera 100 q2, last 11/20, due 12/04 - last HD 11/29 (Wed), post wt 137.5kg  - dry wt has been dropping     Assessment/ Plan: Sepsis/ shock - w/ lactic acidosis, probable UTI. UCx 20K colonies GNR and 50K yeast, on IV abx and levo gtt at 16.   AMS - secondary to sepsis Chronic hypotension - takes midodrine at home. Levo gtt here.  Chronic resp faliure - due to OHS/ OSA/ obesity Volume overload - diffuse severe bilat dependent pitting LE edema > UE's/ abd wall edema. Will ^UF to 100-200 cc/hr.  CAD h/o CABG 2010 ESRD - usual HD MWF, missed HD Friday. CRRT started 12/02 due to sepsis/ shock and severe vol overload. Cont CRRT.  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  MBD ckd - CCa and phos in range. Not on any vdra. Follow.       [name] 02/22/2022, 4:19 PM   Recent Labs  Lab 02/21/22 0419 02/21/22 1600 02/22/22 0345  HGB 9.1*  --  9.4*  ALBUMIN  --  1.8* 1.7*  CALCIUM 8.7* 8.0* 8.1*  PHOS  --  5.5* 4.5  CREATININE 5.26* 4.86* 3.65*  K 3.9 4.2 4.2   No results for input(s): "IRON", "TIBC", "FERRITIN" in the last 168 hours. Inpatient medications:  Chlorhexidine Gluconate Cloth  6 each Topical Daily   [START ON 02/23/2022] darbepoetin (ARANESP) injection - DIALYSIS  100 mcg Subcutaneous Q Mon-1800   heparin  5,000 Units Subcutaneous Q8H   insulin aspart  0-9 Units Subcutaneous Q4H   midodrine  10 mg Per Tube Q8H     prismasol BGK 4/2.5 400 mL/hr at 02/22/22 1601    prismasol BGK 4/2.5 400 mL/hr at  02/22/22 1559   sodium chloride     sodium chloride 10 mL/hr at 02/22/22 1200   ceFEPime (MAXIPIME) IV 2 g (02/22/22 1221)   DAPTOmycin (CUBICIN) 550 mg in sodium chloride 0.9 % IVPB     dexmedetomidine (PRECEDEX) IV infusion 0.5 mcg/kg/hr (02/22/22 1200)   heparin 10,000 units/ 20 mL infusion syringe 500 Units/hr (02/22/22 1000)   norepinephrine (LEVOPHED) Adult infusion 16 mcg/min (02/22/22 1200)   prismasol BGK 4/2.5 1,500 mL/hr at 02/22/22 0423   sodium chloride, heparin, lip balm, morphine injection

## 2022-02-22 NOTE — Progress Notes (Signed)
NAME:  [name], MRN:  [id number], DOB:  January 05, 1966, LOS: 2 ADMISSION DATE:  03/06/2022, CONSULTATION DATE:  03/21/2022 REFERRING MD:  Marda Stalker, CHIEF COMPLAINT:  AMS   History of Present Illness:  [name] is a 56y.o. female with a significant PMH for DM2, HTN, HLD, Morbid Obesity, OSA, CKD 5, CAD/CABG, Chronic Diastolic CHF. Patient was brought to [facility] from [facility] where she has been alerted for the last three days. At baseline, she is A/Ox4 and can ambulate with assistance. She was recently diagnosed with a UTI (11/28) and was given Cirpo as well as a gram of Rocephin. She is a on iHD,MWF, and missed her last two sessions, except for today (12/1).   Pertinent labs in the ER showed WBC 21.9, Cr 5.00, AST 141 ALT 51, LA 6.1. UA positive for leukocytes, proteins, and many bacteria. CT Head showed NAICA. Chest Xray showed CHF versus volume overload. Patient did receive Linezolid prior to Blood Cultures being collected.  BC pending.   PCCM consulted for AMS in the setting of septic shock.   Pertinent  Medical History  Unable to obtain d/t AMS.   Significant Hospital Events: Including procedures, antibiotic start and stop dates in addition to other pertinent events   12/1 Admitted  12/1 CVC and arterial catheter placed  Interim History / Subjective:   Started Precedex 12/2, currently on 0.5 Norepinephrine 11-15 I/O+ 560 cc total (-1.2 L / 24 hours) Cultures are negative so far   Objective   Blood pressure (!) 96/47, pulse 83, temperature 98.7 F (37.1 C), temperature source Axillary, resp. rate 14, weight 135.8 kg, SpO2 98 %.        Intake/Output Summary (Last 24 hours) at 02/22/2022 0906 Last data filed at 02/22/2022 0700 Gross per 24 hour  Intake 986.02 ml  Output 2288.6 ml  Net -1302.58 ml   Filed Weights   02/26/2022 1500 03/22/2022 1630 02/21/22 0500  Weight: (!) 137 kg (!) 138 kg 135.8 kg    Physical Examination: General: Obese critically  ill-appearing woman laying in bed HEENT: Oropharynx clear, no secretions or stridor.  Pupils equal Neuro: opens eyes and tracks, will repeat words, garbled speech, moves all ext spont. Did not follow commands CV: Regular, distant, no murmur PULM: Decreased at both bases, no crackles or wheezes GI: Obese, nondistended with positive bowel sounds Extremities: Trace bilateral pretibial lower extremity edema Skin: No rash  Resolved Hospital Problem list     Assessment & Plan:  Septic Shock  w/ Lactic Acidosis  Source not clear: Urine, cath or skin. Presume Urinary source  Plan -Empiric daptomycin and cefepime, no positive cultures so far -Wean norepinephrine as able, MAP goal 65 -Careful with volume resuscitation given history of renal failure, aim for euvolemia -Follow lactic acid 6.1 > 2.6 > 3.2  Acute Metabolic Encephalopathy secondary to above > complicated by uremia.  Some concern that there may be a primary neurological source, meningitis although no meningismus.  Improved with Precedex Plan -Wean Precedex as able.  Mental status is clearing -No clear indication for LP, reimaging at this time but low threshold to do so -Careful with sedating medications, she has required some narcotics for chronic generalized pain  Chronic Hypotension Plan -Continue midodrine 10 mg 3 times daily  Chronic Resp Failure likely secondary to Obesity, Hypoventilation, OSA > questionable appearance of Pulmonary Edema Plan -Wean oxygen as able -Hold off on CPAP while she is encephalopathic, restart when mental status will allow -Follow intermittent chest x-ray  Chronic Diastolic HF CAD s/p CABG 1914 Plan -Blood pressure control when shock resolves -Careful with volume shifts -Restart rosuvastatin, fenofibrate, Ranexa, Effient when reliably taking p.o.  ESRD Plan -Tolerating CVVHD although pressor needs up slightly -Appreciate nephrology assistance and management -Follow BMP, minimal urine  output  DM2 Plan -Sliding scale insulin as per protocol -CBG goal 140-180  Right Foot Diabetic Ulcer/Lichenification  Plan -Appreciate WOC    Best Practice (right click and "Reselect all SmartList Selections" daily)   Diet/type: NPO DVT prophylaxis: LMWH GI prophylaxis: PPI Lines: Tunneled Dialysis Catheter  Foley:  N/A Code Status:  full code Last date of multidisciplinary goals of care discussion  Labs   CBC: Recent Labs  Lab 03/08/2022 1100 03/06/2022 1320 03/02/2022 1328 02/28/2022 1609 02/21/22 0419 02/22/22 0345  WBC 21.9*  --   --  24.3* 26.2* 26.3*  NEUTROABS 19.2*  --   --   --   --   --   HGB 8.9* 10.2* 10.2* 9.4* 9.1* 9.4*  HCT 26.1* 30.0* 30.0* 27.2* 26.2* 27.0*  MCV 79.1*  --   --  78.8* 76.6* 76.5*  PLT 136*  --   --  153 150 782    Basic Metabolic Panel: Recent Labs  Lab 02/22/2022 1100 03/07/2022 1320 03/02/2022 1328 03/14/2022 1609 02/21/22 0419 02/21/22 1600 02/22/22 0345  NA 131* 131* 131* 132* 130* 132* 134*  K 4.2 4.0 4.0 4.4 3.9 4.2 4.2  CL 91* 93*  --  92* 92* 96* 100  CO2 19*  --   --  17* 22 20* 20*  GLUCOSE 211* 212*  --  241* 257* 168* 133*  BUN 54* 53*  --  57* 61* 60* 45*  CREATININE 5.00* 5.50*  --  5.11* 5.26* 4.86* 3.65*  CALCIUM 9.4  --   --  8.6* 8.7* 8.0* 8.1*  MG 2.1  --   --   --  2.2  --  2.3  PHOS  --   --   --   --   --  5.5* 4.5   GFR: Estimated Creatinine Clearance: 24.5 mL/min (A) (by C-G formula based on SCr of 3.65 mg/dL (H)). Recent Labs  Lab 03/13/2022 1100 03/19/2022 1609 03/04/2022 2023 02/21/22 0419 02/22/22 0345  PROCALCITON  --  3.68  --  4.00 2.97  WBC 21.9* 24.3*  --  26.2* 26.3*  LATICACIDVEN 6.1*  --  5.1* 2.6* 3.2*    Liver Function Tests: Recent Labs  Lab 02/22/2022 1100 02/21/22 1600 02/22/22 0345  AST 141*  --   --   ALT 51*  --   --   ALKPHOS 112  --   --   BILITOT 4.6*  --   --   PROT 6.7  --   --   ALBUMIN 2.0* 1.8* 1.7*   No results for input(s): "LIPASE", "AMYLASE" in the last 168  hours. Recent Labs  Lab 03/18/2022 1100  AMMONIA 32    ABG    Component Value Date/Time   PHART 7.35 12/27/2021 0954   PCO2ART 41 12/27/2021 0954   PO2ART 47 (L) 12/27/2021 0954   HCO3 23.2 03/03/2022 2022   TCO2 22 03/06/2022 1328   ACIDBASEDEF 2.4 (H) 02/23/2022 2022   O2SAT 56.2 03/10/2022 2022     Coagulation Profile: Recent Labs  Lab 02/21/2022 1100  INR 2.1*    Cardiac Enzymes: No results for input(s): "CKTOTAL", "CKMB", "CKMBINDEX", "TROPONINI" in the last 168 hours.  HbA1C: Hgb A1c MFr Bld  Date/Time Value Ref  Range Status  10/25/2021 01:46 AM 6.9 (H) 4.8 - 5.6 % Final    Comment:    (NOTE) Pre diabetes:          5.7%-6.4%  Diabetes:              >6.4%  Glycemic control for   <7.0% adults with diabetes   06/28/2020 02:42 PM 7.5 (H) 4.8 - 5.6 % Final    Comment:    REPEATED TO VERIFY (NOTE) Pre diabetes:          5.7%-6.4%  Diabetes:              >6.4%  Glycemic control for   <7.0% adults with diabetes     CBG: Recent Labs  Lab 02/21/22 1612 02/21/22 1931 02/21/22 2326 02/22/22 0324 02/22/22 0848  GLUCAP 151* 137* 129* 131* 108*    Critical care time: 26 min     Baltazar Apo, MD, PhD 02/22/2022, 9:06 AM [facility] [phone] or if no answer before 7:00PM call [phone] For any issues after 7:00PM please call eLink [phone]

## 2022-02-22 NOTE — Progress Notes (Signed)
Bleeding wound observed on right foot during dressing change. Dressing applied to site. Will inform day shift RN of findings.

## 2022-02-23 ENCOUNTER — Inpatient Hospital Stay (HOSPITAL_COMMUNITY): Payer: Medicare Other

## 2022-02-23 DIAGNOSIS — N186 End stage renal disease: Secondary | ICD-10-CM

## 2022-02-23 DIAGNOSIS — R6521 Severe sepsis with septic shock: Secondary | ICD-10-CM | POA: Diagnosis not present

## 2022-02-23 DIAGNOSIS — I739 Peripheral vascular disease, unspecified: Secondary | ICD-10-CM

## 2022-02-23 DIAGNOSIS — I509 Heart failure, unspecified: Secondary | ICD-10-CM

## 2022-02-23 DIAGNOSIS — Z992 Dependence on renal dialysis: Secondary | ICD-10-CM

## 2022-02-23 DIAGNOSIS — A419 Sepsis, unspecified organism: Secondary | ICD-10-CM | POA: Diagnosis not present

## 2022-02-23 LAB — RENAL FUNCTION PANEL
Albumin: <1.5 g/dL — ABNORMAL LOW (ref 3.5–5.0)
Albumin: <1.5 g/dL — ABNORMAL LOW (ref 3.5–5.0)
Anion gap: 12 (ref 5–15)
Anion gap: 11 (ref 5–15)
BUN: 27 mg/dL — ABNORMAL HIGH (ref 6–20)
BUN: 25 mg/dL — ABNORMAL HIGH (ref 6–20)
CO2: 20 mmol/L — ABNORMAL LOW (ref 22–32)
CO2: 20 mmol/L — ABNORMAL LOW (ref 22–32)
Calcium: 8.2 mg/dL — ABNORMAL LOW (ref 8.9–10.3)
Calcium: 8.6 mg/dL — ABNORMAL LOW (ref 8.9–10.3)
Chloride: 101 mmol/L (ref 98–111)
Chloride: 103 mmol/L (ref 98–111)
Creatinine, Ser: 2.27 mg/dL — ABNORMAL HIGH (ref 0.44–1.00)
Creatinine, Ser: 1.96 mg/dL — ABNORMAL HIGH (ref 0.44–1.00)
GFR, Estimated: 25 mL/min — ABNORMAL LOW (ref >60)
GFR, Estimated: 29 mL/min — ABNORMAL LOW (ref >60)
Glucose, Bld: 122 mg/dL — ABNORMAL HIGH (ref 70–99)
Glucose, Bld: 129 mg/dL — ABNORMAL HIGH (ref 70–99)
Phosphorus: 3.5 mg/dL (ref 2.5–4.6)
Phosphorus: 3.7 mg/dL (ref 2.5–4.6)
Potassium: 4.1 mmol/L (ref 3.5–5.1)
Potassium: 4.7 mmol/L (ref 3.5–5.1)
Sodium: 133 mmol/L — ABNORMAL LOW (ref 135–145)
Sodium: 134 mmol/L — ABNORMAL LOW (ref 135–145)

## 2022-02-23 LAB — CBC
HCT: 28.2 % — ABNORMAL LOW (ref 36.0–46.0)
Hemoglobin: 9.2 g/dL — ABNORMAL LOW (ref 12.0–15.0)
MCH: 26.3 pg (ref 26.0–34.0)
MCHC: 32.6 g/dL (ref 30.0–36.0)
MCV: 80.6 fL (ref 80.0–100.0)
Platelets: 154 10*3/uL (ref 150–400)
RBC: 3.5 MIL/uL — ABNORMAL LOW (ref 3.87–5.11)
RDW: 20 % — ABNORMAL HIGH (ref 11.5–15.5)
WBC: 24.7 10*3/uL — ABNORMAL HIGH (ref 4.0–10.5)
nRBC: 1.1 % — ABNORMAL HIGH (ref 0.0–0.2)

## 2022-02-23 LAB — POCT I-STAT 7, (LYTES, BLD GAS, ICA,H+H)
Acid-base deficit: 5 mmol/L — ABNORMAL HIGH (ref 0.0–2.0)
Bicarbonate: 20.1 mmol/L (ref 20.0–28.0)
Calcium, Ion: 1.18 mmol/L (ref 1.15–1.40)
HCT: 30 % — ABNORMAL LOW (ref 36.0–46.0)
Hemoglobin: 10.2 g/dL — ABNORMAL LOW (ref 12.0–15.0)
O2 Saturation: 99 %
Potassium: 4.2 mmol/L (ref 3.5–5.1)
Sodium: 137 mmol/L (ref 135–145)
TCO2: 21 mmol/L — ABNORMAL LOW (ref 22–32)
pCO2 arterial: 35.6 mmHg (ref 32–48)
pH, Arterial: 7.359 (ref 7.35–7.45)
pO2, Arterial: 130 mmHg — ABNORMAL HIGH (ref 83–108)

## 2022-02-23 LAB — COOXEMETRY PANEL
Carboxyhemoglobin: 2.4 % — ABNORMAL HIGH (ref 0.5–1.5)
Methemoglobin: 0.7 % (ref 0.0–1.5)
O2 Saturation: 89.9 %
Total hemoglobin: 9.9 g/dL — ABNORMAL LOW (ref 12.0–16.0)

## 2022-02-23 LAB — APTT: aPTT: 83 seconds — ABNORMAL HIGH (ref 24–36)

## 2022-02-23 LAB — URINE CULTURE: Culture: 20000 — AB

## 2022-02-23 LAB — GLUCOSE, CAPILLARY
Glucose-Capillary: 265 mg/dL — ABNORMAL HIGH (ref 70–99)
Glucose-Capillary: 113 mg/dL — ABNORMAL HIGH (ref 70–99)
Glucose-Capillary: 115 mg/dL — ABNORMAL HIGH (ref 70–99)
Glucose-Capillary: 115 mg/dL — ABNORMAL HIGH (ref 70–99)
Glucose-Capillary: 123 mg/dL — ABNORMAL HIGH (ref 70–99)
Glucose-Capillary: 114 mg/dL — ABNORMAL HIGH (ref 70–99)
Glucose-Capillary: 122 mg/dL — ABNORMAL HIGH (ref 70–99)

## 2022-02-23 LAB — MAGNESIUM: Magnesium: 2.3 mg/dL (ref 1.7–2.4)

## 2022-02-23 LAB — LACTIC ACID, PLASMA: Lactic Acid, Venous: 2.4 mmol/L (ref 0.5–1.9)

## 2022-02-23 LAB — C-REACTIVE PROTEIN: CRP: 33.5 mg/dL — ABNORMAL HIGH (ref <1.0)

## 2022-02-23 LAB — VITAMIN D 25 HYDROXY (VIT D DEFICIENCY, FRACTURES): Vit D, 25-Hydroxy: 32.06 ng/mL (ref 30–100)

## 2022-02-23 MED ORDER — VITAL HIGH PROTEIN PO LIQD: 1000.0000 mL | ORAL | Status: DC

## 2022-02-23 MED ORDER — ORAL CARE MOUTH RINSE
15.0000 mL | OROMUCOSAL | Status: DC
  Administered 2022-02-23 – 2022-02-25 (×7): 15 mL via OROMUCOSAL

## 2022-02-23 MED ORDER — ACETAMINOPHEN 500 MG PO TABS: 1000.0000 mg | ORAL_TABLET | Freq: Four times a day (QID) | ORAL | Status: DC | PRN

## 2022-02-23 MED ORDER — ORAL CARE MOUTH RINSE: 15.0000 mL | OROMUCOSAL | Status: DC | PRN

## 2022-02-23 MED ORDER — ROSUVASTATIN CALCIUM 5 MG PO TABS
5.0000 mg | ORAL_TABLET | Freq: Every day | ORAL | Status: DC
  Administered 2022-02-23: 5 mg
  Filled 2022-02-23 (×2): qty 1

## 2022-02-23 MED ORDER — VASOPRESSIN 20 UNITS/100 ML INFUSION FOR SHOCK
0.0400 [IU]/min | INTRAVENOUS | Status: DC
  Administered 2022-02-23 – 2022-02-27 (×13): 0.04 [IU]/min via INTRAVENOUS
  Filled 2022-02-23 (×14): qty 100

## 2022-02-23 MED ORDER — RENA-VITE PO TABS
1.0000 | ORAL_TABLET | Freq: Every day | ORAL | Status: DC
  Administered 2022-02-23 – 2022-02-26 (×4): 1
  Filled 2022-02-23 (×4): qty 1

## 2022-02-23 MED ORDER — VITAL 1.5 CAL PO LIQD
1000.0000 mL | ORAL | Status: DC
  Administered 2022-02-23 (×3): 20 mL/h
  Administered 2022-02-23: 1000 mL
  Administered 2022-02-23: 20 mL/h
  Administered 2022-02-25: 1000 mL

## 2022-02-23 MED ORDER — PRASUGREL HCL 10 MG PO TABS
10.0000 mg | ORAL_TABLET | Freq: Every day | ORAL | Status: DC
  Administered 2022-02-23: 10 mg
  Filled 2022-02-23 (×2): qty 1

## 2022-02-23 NOTE — Progress Notes (Signed)
Initial Nutrition Assessment  DOCUMENTATION CODES:   Non-severe (moderate) malnutrition in context of chronic illness  INTERVENTION:   - Plan to exchange NG tube out for Cortrak and bridle today  Trickle tube feeding via Cortrak: - Vital 1.5 @ 20 ml/hr (480 ml/day)   RD will monitor for ability to advance tube feeding regimen to goal: - Vital 1.5 @ 55 ml/hr (1320 ml/day) - PROSource TF20 60 ml BID  Tube feeding regimen at recommended goal rate would provide 2140 kcal, 129 grams of protein, and 1008 ml of H2O.   - Renal MVI daily per tube  - Checking vitamin A, vitamin C, vitamin D, and zinc labs; recommend supplementation if deficiencies are identified  NUTRITION DIAGNOSIS:   Moderate Malnutrition related to chronic illness (ESRD, CHF) as evidenced by mild fat depletion, moderate muscle depletion.  GOAL:   Patient will meet greater than or equal to 90% of their needs  MONITOR:   Diet advancement, Labs, Weight trends, TF tolerance, Skin, I & O's  REASON FOR ASSESSMENT:   Consult Enteral/tube feeding initiation and management (trickle tube feeds)  ASSESSMENT:   56 year old female who presented to the ED on 12/01 with AMS. PMH of ESRD on HD, T2DM, HTN, HLD, CAD/CABG, CHF. Pt admitted with septic shock.  12/02 - CRRT start  Discussed pt with RN and during ICU rounds. Consult received for trickle tube feeding initiation. Pt with NG tube tip and side port within the stomach per abdominal x-ray on 12/2. Plan to exchange NG tube for Cortrak today.  Pt unable to provide history at this time. Spoke with pt's family at bedside. Family reports that pt has had a decreased appetite for the last month. Family reports that pt has been at a rehab facility and that they were told that she wasn't eating. Pt was receiving protein modular supplements at rehab facility and was consuming these. Family reports pt would eat a few bites at a meal and be done.  Pt's family shares that pt has  lost weight but unsure if it is fluid vs true dry weight loss. Current weight is 7 kg below EDW which was recently lowered per Nephrology notes. Suspect true dry weight loss is present. Pt meets criteria for moderate malnutrition.  Given multiple wounds and malnutrition, RD to check vitamin and mineral labs.  EDW: 137.5 kg (recently lowered) Admit weight: 137 kg Current weight: 130.4 kg  Medications reviewed and include: aranesp weekly, SSI q 4 hours, IV abx, levophed @ 15 mcg/min, vasopressin @ 0.04 units/min  Vitamin/Mineral Profile: Vitamin A: pending Vitamin D: pending Vitamin C: pending Zinc: pending CRP: pending  Labs reviewed: BUN 27, creatinine 2.27, WBC 24.7 CBG's: 95-115 x 24 hours  CRRT UF: 5960 ml x 24 hours I/O's: -4.1 L since admit  NUTRITION - FOCUSED PHYSICAL EXAM:  Flowsheet Row Most Recent Value  Orbital Region Mild depletion  Upper Arm Region Mild depletion  Thoracic and Lumbar Region No depletion  Buccal Region No depletion  Temple Region Moderate depletion  Clavicle Bone Region Moderate depletion  Clavicle and Acromion Bone Region Moderate depletion  Scapular Bone Region Mild depletion  Dorsal Hand Moderate depletion  Patellar Region Mild depletion  [edema may be masking more significant depletions]  Anterior Thigh Region Mild depletion  [edema may be masking more significant depletions]  Posterior Calf Region Unable to assess  [bandages]  Edema (RD Assessment) Mild  [BUE, BLE]  Hair Reviewed  Eyes Reviewed  Mouth Reviewed  Skin Reviewed  Nails  Reviewed       Diet Order:   Diet Order             Diet NPO time specified  Diet effective now                   EDUCATION NEEDS:   Education needs have been addressed  Skin:  Skin Assessment: Skin Integrity Issues: Stage II: R buttock, L buttock Other: wound to R foot, IAD to labia and perineum, MASD to abdomen  Last BM:  02/21/22  Height:   Ht Readings from Last 1 Encounters:   01/22/22 '5\' 7"'$  (1.702 m)    Weight:   Wt Readings from Last 1 Encounters:  02/23/22 130.4 kg    Ideal Body Weight:  61.4 kg  BMI:  Body mass index is 45.03 kg/m.  Estimated Nutritional Needs:   Kcal:  2000-2200  Protein:  120-140 grams  Fluid:  1000 ml + UOP    Gustavus Bryant, MS, RD, LDN Inpatient Clinical Dietitian Please see AMiON for contact information.
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
## 2022-02-23 NOTE — Procedures (Signed)
Cortrak  Tube Type:  Cortrak - 43 inches Tube Location:  Right nare Technique Used to Measure Tube Placement:  Marking at nare/corner of mouth Cortrak Secured At:  70 cm   Cortrak Tube Team Note:  Consult received to place a Cortrak feeding tube.   X-ray is required, abdominal x-ray has been ordered by the Cortrak team. Please confirm tube placement before using the Cortrak tube.   If the tube becomes dislodged please keep the tube and contact the Cortrak team at [email or web address] for replacement.  If after hours and replacement cannot be delayed, place a NG tube and confirm placement with an abdominal x-ray.    Koleen Distance MS, RD, LDN Please refer to [facility] for RD and/or RD on-call/weekend/after hours pager

## 2022-02-23 NOTE — Progress Notes (Signed)
NAME:  [name], MRN:  [id number], DOB:  Mar 22, 1966, LOS: 3 ADMISSION DATE:  03/02/2022, CONSULTATION DATE:  03/10/2022 REFERRING MD:  Marda Stalker, CHIEF COMPLAINT:  AMS   History of Present Illness:  [name] is a 56y.o. female with a significant PMH for DM2, HTN, HLD, Morbid Obesity, OSA, CKD 5, CAD/CABG, Chronic Diastolic CHF. Patient was brought to [facility] from [facility] where she has been alerted for the last three days. At baseline, she is A/Ox4 and can ambulate with assistance. She was recently diagnosed with a UTI (11/28) and was given Cirpo as well as a gram of Rocephin. She is a on iHD,MWF, and missed her last two sessions, except for today (12/1).   Pertinent labs in the ER showed WBC 21.9, Cr 5.00, AST 141 ALT 51, LA 6.1. UA positive for leukocytes, proteins, and many bacteria. CT Head showed NAICA. Chest Xray showed CHF versus volume overload. Patient did receive Linezolid prior to Blood Cultures being collected.  BC pending.   PCCM consulted for AMS in the setting of septic shock.   Pertinent  Medical History  Unable to obtain d/t AMS.   Significant Hospital Events: Including procedures, antibiotic start and stop dates in addition to other pertinent events   12/1 Admitted  12/1 CVC and arterial catheter placed  Interim History / Subjective:  Encephalopathic On pressors + precedex  Objective   Blood pressure (!) 96/47, pulse 87, temperature 98.7 F (37.1 C), temperature source Axillary, resp. rate (!) 8, weight 130.4 kg, SpO2 99 %.        Intake/Output Summary (Last 24 hours) at 02/23/2022 0859 Last data filed at 02/23/2022 0700 Gross per 24 hour  Intake 1567.77 ml  Output 5830 ml  Net -4262.23 ml    Filed Weights   03/07/2022 1630 02/21/22 0500 02/23/22 0500  Weight: (!) 138 kg 135.8 kg 130.4 kg    Physical Examination: Ill appearing woman in NAD Moans to questions W/d x 4 Lungs/heart sounds diminished due to body habitus Chronic wound  of RLE noted, no doppler on R side but foot does not appear mottled Multiple missing toes  CBG ok CBC stable Chemistries stable with CRRT  Resolved Hospital Problem list     Assessment & Plan:  Septic Shock  w/ Lactic Acidosis  Source not clear: Urine, cath or skin. Presume Urinary source  Plan -Empiric daptomycin and cefepime, no positive cultures so far -Wean norepinephrine as able, MAP goal 65 - follow culture data - Add vasopressin to levophed   Acute Metabolic Encephalopathy secondary to above > complicated by uremia.  Some concern that there may be a primary neurological source, meningitis although no meningismus.  Would not tolerate LP at this time. - Wean precedex - Give time, abx, encourage day/night cycles - Check ABG  Chronic Resp Failure likely secondary to Obesity, Hypoventilation, OSA > questionable appearance of Pulmonary Edema Plan -Wean oxygen as able -Hold off on CPAP while she is encephalopathic, restart when mental status will allow  Chronic Diastolic HF CAD s/p CABG 7564 Chronic Hypotension Plan -Continue midodrine 10 mg 3 times daily - Check coox - Restart rosuvastatin, fenofibrate, effient - Hold ranexa, metoprolol  ESRD Plan - Switch to neutral pull with escalating pressor needs  DM2 Plan -Sliding scale insulin as per protocol -CBG goal 140-180  Right Foot Diabetic Ulcer/Lichenification  Plan -Appreciate WOC: wet/dry daily - Check arterial duplex but suspect this is just issues with trying to find arteries in extremely thick skin with  baseline PVD  Best Practice (right click and "Reselect all SmartList Selections" daily)   Diet/type: start trickles DVT prophylaxis: LMWH GI prophylaxis: PPI Lines: Tunneled Dialysis Catheter  Foley:  N/A Code Status:  full code Last date of multidisciplinary goals of care discussion: pending clinical course, she does not look well for a 56 year old; 4 admits since August; will ask PMT to establish  relationship not sure she will survive more than another year or two at current clinical trajectory  39 min cc time [name] Emery MD PCCM

## 2022-02-23 NOTE — Progress Notes (Signed)
[facility] Progress Note  Subjective: pt seen in ICU. Net neg 4.3L yesterday, now running even. Getting levo support at 16 ug/min. Labs okay. Family bedside  Vitals:   02/23/22 1000 02/23/22 1015 02/23/22 1030 02/23/22 1045  BP:      Pulse: 88 88 88 91  Resp: 10 (!) 6 (!) 6 (!) 6  Temp:      TempSrc:      SpO2: 100% 100% 98% 96%  Weight:        Exam: Gen lethargic, obese, [place] O2  No rash, cyanosis or gangrene Sclera anicteric, throat clear  No jvd or bruits Chest clear bilat to bases, no rales/ wheezing RRR no MRG Abd soft ntnd no mass or ascites +bs Ext diffuse 1+ tight bilat hip > pretib/ abd wall edema Neuro lethargic    RIJ TDC in place          Home meds include - albuterol, aspirin, effient, uloric, fenofibrate, insulin lispro, midodrine 10 tid, sl ntg, ranexa, crestor, lexapro, lopressor 25 bid, oxy IR prn, prns/ vits/ supps     CXR - CM and vasc congestion   Creat 5.2  K 3.9  BUn 61  Na 130   WBC 26 K  Hb 9.1     LA 5.1 > 2.6 ,  PC 4.0    BCx's neg x 2 < 12 hrs    BP 96/47, HR 80, RR 10--20  afebrile    OP HD: MWF [place] 4h  400/ 600   137.5kg  2/2 bath  RIJ TDC   Hep 5000x 1 - venofer 100 tiw IV thru 12/11 - mircera 100 q2, last 11/20, due 12/04 - last HD 11/29 (Wed), post wt 137.5kg  - dry wt has been dropping     Assessment/ Plan: Sepsis/ shock - w/ lactic acidosis, probable UTI. On IV abx and levo gtt.  Broad spectrum coverage pending cultures - urine with enterobacter, blood cx NGTD.    AMS - secondary to sepsis Chronic hypotension - takes midodrine at home. Levo gtt here.  Chronic resp faliure - due to OHS/ OSA/ obesity Volume overload - diffuse severe bilat dependent pitting LE edema > UE's/ abd wall edema. Was quite net neg yesterday, running even for now - still with some volume but will give some time for vascular refill   CAD h/o CABG 2010 ESRD - usual HD MWF, missed HD Friday. CRRT started 12/02 due to sepsis/ shock and vol  overload. Cont CRRT.  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  MBD ckd - CCa and phos in range. Not on any vdra. Follow.     Jannifer Hick MD [facility] Pager [phone]    Recent Labs  Lab 02/22/22 1600 02/23/22 0330 02/23/22 0931  HGB  --  9.2* 10.2*  ALBUMIN 1.6* <1.5*  --   CALCIUM 8.3* 8.2*  --   PHOS 3.6 3.5  --   CREATININE 2.80* 2.27*  --   K 4.3 4.1 4.2    No results for input(s): "IRON", "TIBC", "FERRITIN" in the last 168 hours. Inpatient medications:  Chlorhexidine Gluconate Cloth  6 each Topical Daily   darbepoetin (ARANESP) injection - DIALYSIS  100 mcg Subcutaneous Q Mon-1800   heparin  5,000 Units Subcutaneous Q8H   insulin aspart  0-9 Units Subcutaneous Q4H   midodrine  10 mg Per Tube Q8H   mouth  rinse  15 mL Mouth Rinse 4 times per day   prasugrel  10 mg Per Tube Daily   rosuvastatin  5 mg Per Tube Daily     prismasol BGK 4/2.5 400 mL/hr at 02/23/22 0445    prismasol BGK 4/2.5 400 mL/hr at 02/23/22 0444   sodium chloride     sodium chloride 10 mL/hr at 02/23/22 1000   ceFEPime (MAXIPIME) IV Stopped (02/23/22 0012)   DAPTOmycin (CUBICIN) 550 mg in sodium chloride 0.9 % IVPB Stopped (02/22/22 2016)   dexmedetomidine (PRECEDEX) IV infusion 0.4 mcg/kg/hr (02/23/22 1000)   feeding supplement (VITAL 1.5 CAL) 20 mL/hr at 02/23/22 1000   heparin 10,000 units/ 20 mL infusion syringe 500 Units/hr (02/23/22 0415)   norepinephrine (LEVOPHED) Adult infusion 20 mcg/min (02/23/22 1000)   prismasol BGK 4/2.5 1,500 mL/hr at 02/23/22 0918   vasopressin 0.04 Units/min (02/23/22 1000)   sodium chloride, acetaminophen, heparin, lip balm, mouth rinse

## 2022-02-23 NOTE — Progress Notes (Signed)
Lower extremity arterial right study completed.  Preliminary results relayed to Tamala Julian, MD.  See CV Proc for preliminary results report.   Darlin Coco, RDMS, RVT

## 2022-02-24 ENCOUNTER — Inpatient Hospital Stay (HOSPITAL_COMMUNITY): Payer: Medicare Other

## 2022-02-24 DIAGNOSIS — Z515 Encounter for palliative care: Secondary | ICD-10-CM

## 2022-02-24 DIAGNOSIS — G9340 Encephalopathy, unspecified: Secondary | ICD-10-CM

## 2022-02-24 DIAGNOSIS — N186 End stage renal disease: Secondary | ICD-10-CM

## 2022-02-24 DIAGNOSIS — I5032 Chronic diastolic (congestive) heart failure: Secondary | ICD-10-CM

## 2022-02-24 DIAGNOSIS — N3001 Acute cystitis with hematuria: Secondary | ICD-10-CM

## 2022-02-24 DIAGNOSIS — J9611 Chronic respiratory failure with hypoxia: Secondary | ICD-10-CM

## 2022-02-24 DIAGNOSIS — I2510 Atherosclerotic heart disease of native coronary artery without angina pectoris: Secondary | ICD-10-CM

## 2022-02-24 DIAGNOSIS — Z7189 Other specified counseling: Secondary | ICD-10-CM

## 2022-02-24 DIAGNOSIS — Z992 Dependence on renal dialysis: Secondary | ICD-10-CM

## 2022-02-24 DIAGNOSIS — R6521 Severe sepsis with septic shock: Secondary | ICD-10-CM

## 2022-02-24 DIAGNOSIS — A419 Sepsis, unspecified organism: Secondary | ICD-10-CM | POA: Diagnosis not present

## 2022-02-24 DIAGNOSIS — I5033 Acute on chronic diastolic (congestive) heart failure: Secondary | ICD-10-CM

## 2022-02-24 DIAGNOSIS — E872 Acidosis, unspecified: Secondary | ICD-10-CM

## 2022-02-24 LAB — APTT: aPTT: 74 seconds — ABNORMAL HIGH (ref 24–36)

## 2022-02-24 LAB — LACTIC ACID, PLASMA
Lactic Acid, Venous: 7.6 mmol/L (ref 0.5–1.9)
Lactic Acid, Venous: 4.9 mmol/L (ref 0.5–1.9)
Lactic Acid, Venous: 4.5 mmol/L (ref 0.5–1.9)

## 2022-02-24 LAB — CBC
HCT: 29.1 % — ABNORMAL LOW (ref 36.0–46.0)
Hemoglobin: 9.4 g/dL — ABNORMAL LOW (ref 12.0–15.0)
MCH: 26.3 pg (ref 26.0–34.0)
MCHC: 32.3 g/dL (ref 30.0–36.0)
MCV: 81.3 fL (ref 80.0–100.0)
Platelets: 169 10*3/uL (ref 150–400)
RBC: 3.58 MIL/uL — ABNORMAL LOW (ref 3.87–5.11)
RDW: 19.7 % — ABNORMAL HIGH (ref 11.5–15.5)
WBC: 26 10*3/uL — ABNORMAL HIGH (ref 4.0–10.5)
nRBC: 2.5 % — ABNORMAL HIGH (ref 0.0–0.2)

## 2022-02-24 LAB — POCT I-STAT 7, (LYTES, BLD GAS, ICA,H+H)
Acid-base deficit: 8 mmol/L — ABNORMAL HIGH (ref 0.0–2.0)
Acid-base deficit: 6 mmol/L — ABNORMAL HIGH (ref 0.0–2.0)
Bicarbonate: 16.7 mmol/L — ABNORMAL LOW (ref 20.0–28.0)
Bicarbonate: 18.9 mmol/L — ABNORMAL LOW (ref 20.0–28.0)
Calcium, Ion: 1.15 mmol/L (ref 1.15–1.40)
Calcium, Ion: 1.15 mmol/L (ref 1.15–1.40)
HCT: 30 % — ABNORMAL LOW (ref 36.0–46.0)
HCT: 28 % — ABNORMAL LOW (ref 36.0–46.0)
Hemoglobin: 10.2 g/dL — ABNORMAL LOW (ref 12.0–15.0)
Hemoglobin: 9.5 g/dL — ABNORMAL LOW (ref 12.0–15.0)
O2 Saturation: 98 %
O2 Saturation: 100 %
Patient temperature: 99.1
Potassium: 5 mmol/L (ref 3.5–5.1)
Potassium: 4.8 mmol/L (ref 3.5–5.1)
Sodium: 136 mmol/L (ref 135–145)
Sodium: 137 mmol/L (ref 135–145)
TCO2: 18 mmol/L — ABNORMAL LOW (ref 22–32)
TCO2: 20 mmol/L — ABNORMAL LOW (ref 22–32)
pCO2 arterial: 29.1 mmHg — ABNORMAL LOW (ref 32–48)
pCO2 arterial: 33.4 mmHg (ref 32–48)
pH, Arterial: 7.367 (ref 7.35–7.45)
pH, Arterial: 7.361 (ref 7.35–7.45)
pO2, Arterial: 107 mmHg (ref 83–108)
pO2, Arterial: 449 mmHg — ABNORMAL HIGH (ref 83–108)

## 2022-02-24 LAB — ECHOCARDIOGRAM LIMITED
Area-P 1/2: 5.38 cm2
Calc EF: 26.9 %
S' Lateral: 3.8 cm
Single Plane A2C EF: 30.5 %
Single Plane A4C EF: 22.4 %
Weight: 4504.44 oz

## 2022-02-24 LAB — COOXEMETRY PANEL
Carboxyhemoglobin: 1.6 % — ABNORMAL HIGH (ref 0.5–1.5)
Carboxyhemoglobin: 1.8 % — ABNORMAL HIGH (ref 0.5–1.5)
Methemoglobin: 1.3 % (ref 0.0–1.5)
Methemoglobin: <0.7 % (ref 0.0–1.5)
O2 Saturation: 99.2 %
O2 Saturation: 90.3 %
Total hemoglobin: 8.8 g/dL — ABNORMAL LOW (ref 12.0–16.0)
Total hemoglobin: 8.7 g/dL — ABNORMAL LOW (ref 12.0–16.0)

## 2022-02-24 LAB — RENAL FUNCTION PANEL
Albumin: <1.5 g/dL — ABNORMAL LOW (ref 3.5–5.0)
Albumin: <1.5 g/dL — ABNORMAL LOW (ref 3.5–5.0)
Anion gap: 17 — ABNORMAL HIGH (ref 5–15)
Anion gap: 12 (ref 5–15)
BUN: 22 mg/dL — ABNORMAL HIGH (ref 6–20)
BUN: 22 mg/dL — ABNORMAL HIGH (ref 6–20)
CO2: 17 mmol/L — ABNORMAL LOW (ref 22–32)
CO2: 17 mmol/L — ABNORMAL LOW (ref 22–32)
Calcium: 8.8 mg/dL — ABNORMAL LOW (ref 8.9–10.3)
Calcium: 8.4 mg/dL — ABNORMAL LOW (ref 8.9–10.3)
Chloride: 101 mmol/L (ref 98–111)
Chloride: 104 mmol/L (ref 98–111)
Creatinine, Ser: 1.76 mg/dL — ABNORMAL HIGH (ref 0.44–1.00)
Creatinine, Ser: 1.51 mg/dL — ABNORMAL HIGH (ref 0.44–1.00)
GFR, Estimated: 33 mL/min — ABNORMAL LOW (ref >60)
GFR, Estimated: 40 mL/min — ABNORMAL LOW (ref >60)
Glucose, Bld: 122 mg/dL — ABNORMAL HIGH (ref 70–99)
Glucose, Bld: 196 mg/dL — ABNORMAL HIGH (ref 70–99)
Phosphorus: 3.2 mg/dL (ref 2.5–4.6)
Phosphorus: 3.1 mg/dL (ref 2.5–4.6)
Potassium: 4.8 mmol/L (ref 3.5–5.1)
Potassium: 4.6 mmol/L (ref 3.5–5.1)
Sodium: 135 mmol/L (ref 135–145)
Sodium: 133 mmol/L — ABNORMAL LOW (ref 135–145)

## 2022-02-24 LAB — GLUCOSE, CAPILLARY
Glucose-Capillary: 103 mg/dL — ABNORMAL HIGH (ref 70–99)
Glucose-Capillary: 106 mg/dL — ABNORMAL HIGH (ref 70–99)
Glucose-Capillary: 110 mg/dL — ABNORMAL HIGH (ref 70–99)
Glucose-Capillary: 183 mg/dL — ABNORMAL HIGH (ref 70–99)
Glucose-Capillary: 216 mg/dL — ABNORMAL HIGH (ref 70–99)
Glucose-Capillary: 64 mg/dL — ABNORMAL LOW (ref 70–99)
Glucose-Capillary: 83 mg/dL (ref 70–99)
Glucose-Capillary: 93 mg/dL (ref 70–99)

## 2022-02-24 LAB — GLOBAL TEG PANEL
CFF Max Amplitude: >52 mm — ABNORMAL HIGH (ref 15–32)
CK with Heparinase (R): 9.7 min — ABNORMAL HIGH (ref 4.3–8.3)
Citrated Kaolin (K): 0.9 min (ref 0.8–2.1)
Citrated Kaolin (MA): 68.9 mm (ref 52–69)
Citrated Kaolin (R): 8.4 min (ref 4.6–9.1)
Citrated Kaolin Angle: 78 deg (ref 63–78)
Citrated Rapid TEG (MA): 71.9 mm — ABNORMAL HIGH (ref 52–70)

## 2022-02-24 LAB — DIC (DISSEMINATED INTRAVASCULAR COAGULATION)PANEL
D-Dimer, Quant: 4.92 ug/mL-FEU — ABNORMAL HIGH (ref 0.00–0.50)
Fibrinogen: 686 mg/dL — ABNORMAL HIGH (ref 210–475)
INR: 2 — ABNORMAL HIGH (ref 0.8–1.2)
Platelets: 166 10*3/uL (ref 150–400)
Prothrombin Time: 22.7 seconds — ABNORMAL HIGH (ref 11.4–15.2)
Smear Review: NONE SEEN
aPTT: 75 seconds — ABNORMAL HIGH (ref 24–36)

## 2022-02-24 LAB — CK: Total CK: 430 U/L — ABNORMAL HIGH (ref 38–234)

## 2022-02-24 LAB — PROCALCITONIN: Procalcitonin: 4.07 ng/mL

## 2022-02-24 LAB — MAGNESIUM: Magnesium: 2.5 mg/dL — ABNORMAL HIGH (ref 1.7–2.4)

## 2022-02-24 MED ORDER — ORAL CARE MOUTH RINSE
15.0000 mL | OROMUCOSAL | Status: DC
  Administered 2022-02-24 – 2022-02-27 (×40): 15 mL via OROMUCOSAL

## 2022-02-24 MED ORDER — FENTANYL CITRATE PF 50 MCG/ML IJ SOSY
PREFILLED_SYRINGE | INTRAMUSCULAR | Status: AC
  Administered 2022-02-24: 100 ug via INTRAVENOUS
  Filled 2022-02-24: qty 1

## 2022-02-24 MED ORDER — IOHEXOL 9 MG/ML PO SOLN
ORAL | Status: AC
  Administered 2022-02-24: 500 mL
  Filled 2022-02-24: qty 1000

## 2022-02-24 MED ORDER — FAMOTIDINE IN NACL 20-0.9 MG/50ML-% IV SOLN: 20.0000 mg | Freq: Two times a day (BID) | INTRAVENOUS | Status: DC

## 2022-02-24 MED ORDER — IOHEXOL 350 MG/ML SOLN
75.0000 mL | Freq: Once | INTRAVENOUS | Status: AC | PRN
  Administered 2022-02-24: 75 mL via INTRAVENOUS

## 2022-02-24 MED ORDER — ETOMIDATE 2 MG/ML IV SOLN
20.0000 mg | Freq: Once | INTRAVENOUS | Status: AC
  Administered 2022-02-24: 20 mg via INTRAVENOUS
  Filled 2022-02-24: qty 10

## 2022-02-24 MED ORDER — ROCURONIUM BROMIDE 10 MG/ML (PF) SYRINGE
PREFILLED_SYRINGE | INTRAVENOUS | Status: AC
  Filled 2022-02-24: qty 10

## 2022-02-24 MED ORDER — ORAL CARE MOUTH RINSE: 15.0000 mL | OROMUCOSAL | Status: DC | PRN

## 2022-02-24 MED ORDER — POLYETHYLENE GLYCOL 3350 17 G PO PACK: 17.0000 g | PACK | Freq: Every day | ORAL | Status: DC

## 2022-02-24 MED ORDER — DOCUSATE SODIUM 50 MG/5ML PO LIQD
100.0000 mg | Freq: Two times a day (BID) | ORAL | Status: DC
  Administered 2022-02-24 (×2): 100 mg
  Filled 2022-02-24 (×2): qty 10

## 2022-02-24 MED ORDER — METRONIDAZOLE 500 MG/100ML IV SOLN
500.0000 mg | Freq: Two times a day (BID) | INTRAVENOUS | Status: DC
  Administered 2022-02-24 – 2022-02-25 (×4): 500 mg via INTRAVENOUS
  Filled 2022-02-24 (×4): qty 100

## 2022-02-24 MED ORDER — FAMOTIDINE 20 MG PO TABS: 20.0000 mg | ORAL_TABLET | Freq: Two times a day (BID) | ORAL | Status: DC

## 2022-02-24 MED ORDER — FENTANYL BOLUS VIA INFUSION
50.0000 ug | INTRAVENOUS | Status: DC | PRN
  Administered 2022-02-25 (×3): 50 ug via INTRAVENOUS

## 2022-02-24 MED ORDER — DEXTROSE 10 % IV SOLN: INTRAVENOUS | Status: DC

## 2022-02-24 MED ORDER — MIDAZOLAM HCL 2 MG/2ML IJ SOLN
2.0000 mg | Freq: Once | INTRAMUSCULAR | Status: AC
  Administered 2022-02-24: 1 mg via INTRAVENOUS
  Filled 2022-02-24: qty 2

## 2022-02-24 MED ORDER — SODIUM BICARBONATE 8.4 % IV SOLN
50.0000 meq | Freq: Once | INTRAVENOUS | Status: AC
  Administered 2022-02-24: 50 meq via INTRAVENOUS
  Filled 2022-02-24: qty 50

## 2022-02-24 MED ORDER — VITAMIN K1 10 MG/ML IJ SOLN
5.0000 mg | Freq: Once | INTRAVENOUS | Status: AC
  Administered 2022-02-24: 5 mg via INTRAVENOUS
  Filled 2022-02-24: qty 0.5

## 2022-02-24 MED ORDER — FENTANYL CITRATE PF 50 MCG/ML IJ SOSY
100.0000 ug | PREFILLED_SYRINGE | Freq: Once | INTRAMUSCULAR | Status: AC
  Filled 2022-02-24: qty 2

## 2022-02-24 MED ORDER — PERFLUTREN LIPID MICROSPHERE
1.0000 mL | INTRAVENOUS | Status: AC | PRN
  Administered 2022-02-24: 4 mL via INTRAVENOUS

## 2022-02-24 MED ORDER — LIDOCAINE HCL 1 % IJ SOLN
INTRAMUSCULAR | Status: AC
  Filled 2022-02-24: qty 10

## 2022-02-24 MED ORDER — FAMOTIDINE 20 MG PO TABS
10.0000 mg | ORAL_TABLET | Freq: Every day | ORAL | Status: DC
  Administered 2022-02-24 – 2022-02-27 (×4): 10 mg
  Filled 2022-02-24 (×4): qty 1

## 2022-02-24 MED ORDER — FENTANYL 2500MCG IN NS 250ML (10MCG/ML) PREMIX INFUSION
50.0000 ug/h | INTRAVENOUS | Status: DC
  Administered 2022-02-24: 200 ug/h via INTRAVENOUS
  Administered 2022-02-24 – 2022-02-25 (×2): 50 ug/h via INTRAVENOUS
  Filled 2022-02-24 (×3): qty 250

## 2022-02-24 MED ORDER — ALBUMIN HUMAN 5 % IV SOLN
25.0000 g | Freq: Once | INTRAVENOUS | Status: AC
  Administered 2022-02-24: 25 g via INTRAVENOUS
  Filled 2022-02-24: qty 500

## 2022-02-24 MED ORDER — LACTATED RINGERS IV BOLUS
1000.0000 mL | Freq: Once | INTRAVENOUS | Status: AC
  Administered 2022-02-24: 1000 mL via INTRAVENOUS

## 2022-02-24 MED ORDER — ARTIFICIAL TEARS OPHTHALMIC OINT
TOPICAL_OINTMENT | Freq: Three times a day (TID) | OPHTHALMIC | Status: DC
  Filled 2022-02-24: qty 3.5
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
## 2022-02-24 NOTE — Progress Notes (Signed)
Nutrition Follow-up  DOCUMENTATION CODES:   Non-severe (moderate) malnutrition in context of chronic illness  INTERVENTION:   Continue trickle tube feeding via Cortrak: - Vital 1.5 @ 20 ml/hr (480 ml/day)   RD will monitor for ability to advance tube feeding regimen to goal: - Vital 1.5 @ 55 ml/hr (1320 ml/day) - PROSource TF20 60 ml BID   Tube feeding regimen at recommended goal rate would provide 2140 kcal, 129 grams of protein, and 1008 ml of H2O.    - Continue renal MVI daily per tube   - Checking vitamin A, vitamin C, and zinc labs; recommend supplementation if deficiencies are identified  NUTRITION DIAGNOSIS:   Moderate Malnutrition related to chronic illness (ESRD, CHF) as evidenced by mild fat depletion, moderate muscle depletion.  Ongoing, being addressed via nutrition support  GOAL:   Patient will meet greater than or equal to 90% of their needs  Unmet at this time as pt remains on trickle TF  MONITOR:   Vent status, Labs, Weight trends, TF tolerance, Skin, I & O's  REASON FOR ASSESSMENT:   Consult Enteral/tube feeding initiation and management (trickle tube feeds)  ASSESSMENT:   56 year old female who presented to the ED on 12/01 with AMS. PMH of ESRD on HD, T2DM, HTN, HLD, CAD/CABG, CHF. Pt admitted with septic shock.  12/02 - CRRT start 12/04 - Cortrak placed (tip distal stomach vs proximal duodenum) 12/05 - intubated  Discussed pt with RN and during ICU rounds. Per PCCM, septic shock is secondary to UTI. Pt required intubation today. Pt remains on CRRT. Lactic acid back up. Plan for CT chest/abdomen/pelvis and CT head today.  Palliative Medicine has been consulted regarding [place].  Pt with mild pitting edema to BUE, BLE, face, perineum, and sacrum.  EDW: 137.5 kg (recently lowered) Admit weight: 137 kg Current weight: 127.7 kg  Current TF: Vital 1.5 @ 20 ml/hr via Cortrak  Patient is currently intubated on ventilator support MV: 12.1  L/min Temp (24hrs), Avg:98.8 F (37.1 C), Min:98.4 F (36.9 C), Max:99.2 F (37.3 C) BP (a-line): 126/48 MAP (a-line): 68  Drips: Fentanyl Levophed: 42 mcg/min Vasopressin: 0.04 units/min  Medications reviewed and include: aranesp weekly, colace, pepcid, SSI q 4 hours, renavit, miralax, IV abx, IV vitamin K 5 mg x 1  Vitamin/Mineral Profile: Vitamin A: pending Vitamin D: 32.06 (low WNL) Vitamin C: pending Zinc: pending CRP: 33.4 (high)  Labs reviewed: magnesium 2.5, lactic acid 7.6, WBC 26.0, hemoglobin 9.5  CRRT UF: 2282 ml x 24 hours I/O's: -2.9 L since admit  Diet Order:   Diet Order             Diet NPO time specified  Diet effective now                   EDUCATION NEEDS:   Not appropriate for education at this time  Skin:  Skin Assessment: Skin Integrity Issues: Stage II: R buttock, L buttock Other: wound to R foot, IAD to labia and perineum, MASD to abdomen  Last BM:  02/21/22  Height:   Ht Readings from Last 1 Encounters:  01/22/22 '5\' 7"'$  (1.702 m)    Weight:   Wt Readings from Last 1 Encounters:  02/24/22 127.7 kg    Ideal Body Weight:  61.4 kg  BMI:  Body mass index is 44.09 kg/m.  Estimated Nutritional Needs:   Kcal:  2000-2200  Protein:  120-140 grams  Fluid:  1000 ml + UOP    Gustavus Bryant, MS,  RD, LDN Inpatient Clinical Dietitian Please see AMiON for contact information.

## 2022-02-24 NOTE — Progress Notes (Signed)
Pt was transported to CT scan and back to 1A23 without complications.

## 2022-02-24 NOTE — Progress Notes (Signed)
NAME:  [name], MRN:  [id number], DOB:  05/30/1965, LOS: 4 ADMISSION DATE:  03/17/2022, CONSULTATION DATE:  03/10/2022 REFERRING MD:  Marda Stalker, CHIEF COMPLAINT:  AMS   History of Present Illness:  Ms. [name] is a 56y.o. female with a significant PMH for DM2, HTN, HLD, Morbid Obesity, OSA, CKD 5, CAD/CABG, Chronic Diastolic CHF. Patient was brought to [facility] from [facility] where she has been alerted for the last three days. At baseline, she is A/Ox4 and can ambulate with assistance. She was recently diagnosed with a UTI (11/28) and was given Cirpo as well as a gram of Rocephin. She is a on iHD,MWF, and missed her last two sessions, except for today (12/1).   Pertinent labs in the ER showed WBC 21.9, Cr 5.00, AST 141 ALT 51, LA 6.1. UA positive for leukocytes, proteins, and many bacteria. CT Head showed NAICA. Chest Xray showed CHF versus volume overload. Patient did receive Linezolid prior to Blood Cultures being collected.  BC pending.   PCCM consulted for AMS in the setting of septic shock.   Pertinent  Medical History  Unable to obtain d/t AMS.   Significant Hospital Events: Including procedures, antibiotic start and stop dates in addition to other pertinent events   12/1 Admitted  12/1 CVC and arterial catheter placed 12/2 Started Precedex Norepinephrine 11-15 12/4 Worsening Mental Status and pressor requirements   Interim History / Subjective:  Increased bleeding noted to IV and SQ Injection Sites, not obvious signs of major bleeding noted, Hgb Stable. DIC Panel - Fibrinogen 686, D-Dimer 4.92.    Objective   Blood pressure (!) 113/99, pulse (!) 101, temperature 98.7 F (37.1 C), temperature source Axillary, resp. rate (!) 23, weight 127.7 kg, SpO2 97 %.        Intake/Output Summary (Last 24 hours) at 02/24/2022 0714 Last data filed at 02/24/2022 0700 Gross per 24 hour  Intake 1988.4 ml  Output 2282.8 ml  Net -294.4 ml   Filed Weights   02/21/22 0500  02/23/22 0500 02/24/22 0219  Weight: 135.8 kg 130.4 kg 127.7 kg   Physical Examination: General: Chronically ill-appearing obese female in NAD. HEENT: [place]/AT, anicteric sclera, PERRL, moist mucous membranes. Left Subconjunctival hemorrhage     Neuro: Lethargic. Minimal Responds to verbal stimuli. Following commands intermittently. Moves BLE spontaneously.  CV: RRR, no m/g/r. PULM: Breathing even and unlabored on 2L [place]. Clear/Diminished Lung Fields. GI: Soft, nontender, nondistended. Hypoactive  bowel sounds. Extremities: Mild LE edema noted. Skin: Warm/dry, Chronic Lichenification of pretibial skin, multiple toes amputated   Resolved Hospital Problem list     Assessment & Plan:   Septic Shock secondary to UTI UC positive for enterobacter aerogenes  Plan -Continue daptomycin and cefepime  -Wean norepinephrine (32) and vaso as able, MAP goal 65 -BP increased with passive leg raise, will give volume and gauge response Albumin as well. -Follow lactic acid 6.1 > 2.6 > 3.2>2.4 > repeat pending - Renal US   Acute Metabolic Encephalopathy secondary to above > complicated by uremia.  Some concern that there may be a primary neurological source, meningitis although no meningismus.  Improved with Precedex Plan - Off Precedex. Mental status is slighly improving > follows commands - Careful with sedating medications - High Risk for intubation, ABG looks ok.   Chronic Hypotension Plan -Continue midodrine 10 mg 3 times daily  Chronic Resp Failure likely secondary to Obesity, Hypoventilation, OSA > questionable appearance of Pulmonary Edema Plan -Wean oxygen as able -Hold off on CPAP  while she is encephalopathic, restart when mental status will allow -Follow intermittent chest x-ray  Chronic Diastolic HF CAD s/p CABG 2458 Plan -Blood pressure control when shock resolves -Careful with volume shifts - Hold Effient  - Holding statin again d/t increased CK - Holding fenofibrate ranexa,  metoprolol   ESRD Plan -Tolerating CVVHD, currently EVEN,  increasing NE requirements  -Appreciate nephrology assistance and management -Follow BMP, no current urine output  DM2 Plan -Sliding scale insulin as per protocol -CBG goal 140-180  Right Foot Diabetic Ulcer/Lichenification  Plan - Appreciate WOC - Appreciate VVS > cont Prevalon boots/floating heels, consider lower extremity arterial evaluation with angiography once stabilized    Coagulapathy DIC Panel > Elevated Fibrinogen, D-Dimer  Plan - Stopped SQ Heparin - Receiving Heparin via CRRT [employer] > Wonder if we can discontinue. Defer to nephrology. - TEG Ordered  Anion Gap Acidosis - Lactic Pending     Best Practice (right click and "Reselect all SmartList Selections" daily)   Diet/type: CorTrak TF DVT prophylaxis: holding d/t bleeding GI prophylaxis: PPI Lines: Tunneled Dialysis Catheter, Central Line, Aline Foley:  N/A Code Status:  full code Last date of multidisciplinary goals of care discussion: PMT consulted 12/5  Labs   CBC: Recent Labs  Lab 03/04/2022 1100 03/10/2022 1320 03/04/2022 1609 02/21/22 0419 02/22/22 0345 02/23/22 0330 02/23/22 0931 02/24/22 0136  WBC 21.9*  --  24.3* 26.2* 26.3* 24.7*  --  26.0*  NEUTROABS 19.2*  --   --   --   --   --   --   --   HGB 8.9*   < > 9.4* 9.1* 9.4* 9.2* 10.2* 9.4*  HCT 26.1*   < > 27.2* 26.2* 27.0* 28.2* 30.0* 29.1*  MCV 79.1*  --  78.8* 76.6* 76.5* 80.6  --  81.3  PLT 136*  --  153 150 151 154  --  166  169   < > = values in this interval not displayed.    Basic Metabolic Panel: Recent Labs  Lab 03/14/2022 1100 02/25/2022 1320 02/21/22 0419 02/21/22 1600 02/22/22 0345 02/22/22 1600 02/23/22 0330 02/23/22 0931 02/23/22 1509 02/24/22 0136  NA 131*   < > 130*   < > 134* 136 133* 137 134* 135  K 4.2   < > 3.9   < > 4.2 4.3 4.1 4.2 4.7 4.8  CL 91*   < > 92*   < > 100 102 101  --  103 101  CO2 19*   < > 22   < > 20* 21* 20*  --  20* 17*  GLUCOSE 211*    < > 257*   < > 133* 114* 122*  --  129* 122*  BUN 54*   < > 61*   < > 45* 34* 27*  --  25* 22*  CREATININE 5.00*   < > 5.26*   < > 3.65* 2.80* 2.27*  --  1.96* 1.76*  CALCIUM 9.4   < > 8.7*   < > 8.1* 8.3* 8.2*  --  8.6* 8.8*  MG 2.1  --  2.2  --  2.3  --  2.3  --   --  2.5*  PHOS  --   --   --    < > 4.5 3.6 3.5  --  3.7 3.2   < > = values in this interval not displayed.   GFR: Estimated Creatinine Clearance: 49 mL/min (A) (by C-G formula based on SCr of 1.76 mg/dL (H)). Recent  Labs  Lab 03/03/2022 1609 02/25/2022 2023 02/21/22 0419 02/22/22 0345 02/23/22 0330 02/24/22 0136  PROCALCITON 3.68  --  4.00 2.97  --  4.07  WBC 24.3*  --  26.2* 26.3* 24.7* 26.0*  LATICACIDVEN  --  5.1* 2.6* 3.2* 2.4*  --     Liver Function Tests: Recent Labs  Lab 03/11/2022 1100 02/21/22 1600 02/22/22 0345 02/22/22 1600 02/23/22 0330 02/23/22 1509 02/24/22 0136  AST 141*  --   --   --   --   --   --   ALT 51*  --   --   --   --   --   --   ALKPHOS 112  --   --   --   --   --   --   BILITOT 4.6*  --   --   --   --   --   --   PROT 6.7  --   --   --   --   --   --   ALBUMIN 2.0*   < > 1.7* 1.6* <1.5* <1.5* <1.5*   < > = values in this interval not displayed.   No results for input(s): "LIPASE", "AMYLASE" in the last 168 hours. Recent Labs  Lab 02/22/2022 1100  AMMONIA 32    ABG    Component Value Date/Time   PHART 7.359 02/23/2022 0931   PCO2ART 35.6 02/23/2022 0931   PO2ART 130 (H) 02/23/2022 0931   HCO3 20.1 02/23/2022 0931   TCO2 21 (L) 02/23/2022 0931   ACIDBASEDEF 5.0 (H) 02/23/2022 0931   O2SAT 89.9 02/23/2022 0949     Coagulation Profile: Recent Labs  Lab 03/17/2022 1100 02/24/22 0136  INR 2.1* 2.0*    Cardiac Enzymes: Recent Labs  Lab 02/24/22 0136  CKTOTAL 430*    HbA1C: Hgb A1c MFr Bld  Date/Time Value Ref Range Status  10/25/2021 01:46 AM 6.9 (H) 4.8 - 5.6 % Final    Comment:    (NOTE) Pre diabetes:          5.7%-6.4%  Diabetes:               >6.4%  Glycemic control for   <7.0% adults with diabetes   06/28/2020 02:42 PM 7.5 (H) 4.8 - 5.6 % Final    Comment:    REPEATED TO VERIFY (NOTE) Pre diabetes:          5.7%-6.4%  Diabetes:              >6.4%  Glycemic control for   <7.0% adults with diabetes     CBG: Recent Labs  Lab 02/23/22 1120 02/23/22 1503 02/23/22 1940 02/23/22 2317 02/24/22 0410  GLUCAP 115* 123* 114* 122* 103*    Critical care time:     Erma Heritage NP-S   02/24/2022, 7:14 AM [facility] [phone] or if no answer before 7:00PM call [phone] For any issues after 7:00PM please call eLink [phone]   I have evaluated the patient in collaboration with Erma Heritage NP-S and have edited the above note as deemed appropriate. My critical care time independent of the procedures and student time is 46 minutes. Patient requires critical care decision making due to septic shock, acute metabolic encephalopathy, and ESRD on CRRT.    Georgann Housekeeper, AGACNP-BC [facility]  See Amion for personal pager PCCM on call pager [phone] until 7pm. Please call Elink 7p-7a. [phone]  02/24/2022 10:07 AM

## 2022-02-24 NOTE — Progress Notes (Signed)
Echocardiogram 2D Echocardiogram has been performed.  [name] 02/24/2022, 2:17 PM

## 2022-02-24 NOTE — Progress Notes (Signed)
eLink Physician-Brief Progress Note Patient Name: [name] DOB: Jun 06, 1965 MRN: [id number]   Date of Service  02/24/2022  HPI/Events of Note  RN notified me that patient has had bleeding from IV sites and from [facility] injection sites. No other bleeding. No oral bleeding. No rectal bleeding. INR a couple days ago was 2.1. She is on [place] heparin and getting heparin via CRRT. She is also on Prasugrel.   eICU Interventions  Stop [place] heparin for now  Check AM labs including a DIC panel  D/w RN      Intervention Category Major Interventions: Hemorrhage - evaluation and management  Deette Revak G Mazie Fencl 02/24/2022, 1:33 AM

## 2022-02-24 NOTE — Procedures (Signed)
Intubation Procedure Note  [name]  [id number]  07-31-1965  Date:02/24/22  Time:11:07 AM   Provider Performing:Odester Nilson Rodman Pickle, MD and Erma Heritage, NP-S   Procedure: Intubation (31500)  Indication(s) Respiratory Failure  Consent Risks of the procedure as well as the alternatives and risks of each were explained to the patient and/or caregiver.  Consent for the procedure was obtained and is signed in the bedside chart   Anesthesia Etomidate, Versed, and Fentanyl   Time Out Verified patient identification, verified procedure, site/side was marked, verified correct patient position, special equipment/implants available, medications/allergies/relevant history reviewed, required imaging and test results available.   Sterile Technique Usual hand hygeine, masks, and gloves were used   Procedure Description Patient positioned in bed supine.  Sedation given as noted above.  Patient was intubated with endotracheal tube using Glidescope.  View was Grade 2 only posterior commissure .  Number of attempts was 1.  Colorimetric CO2 detector was consistent with tracheal placement.   Complications/Tolerance Post-procedure hypotension Chest X-ray is ordered to verify placement.   EBL Minimal   Specimen(s) None

## 2022-02-24 NOTE — Progress Notes (Signed)
[facility] Progress Note  Subjective: pt seen in ICU. Net neg 0.2L yesterday, now being give volume challenge due to worsening shock now requiring levo support at 32 ug/min.  Flagyl added. Coagulopathic and oozing now - requested that circuit heparin be removed.  Was intubated earlier today due to mental status.   Vitals:   02/24/22 1121 02/24/22 1200 02/24/22 1219 02/24/22 1300  BP:      Pulse:  96 98 100  Resp:  (!) 23 (!) 22 (!) 25  Temp: 98.4 F (36.9 C)     TempSrc: Axillary     SpO2:  99% 99% 99%  Weight:        Exam: Gen intubated, obese No rash, cyanosis or gangrene No jvd or bruits Chest clear bilat to bases, no rales/ wheezing RRR no MRG Abd soft ntnd no mass or ascites +bs Ext diffuse 1+ bilat hip > pretib/ abd wall edema Neuro lethargic    RIJ TDC in place          Home meds include - albuterol, aspirin, effient, uloric, fenofibrate, insulin lispro, midodrine 10 tid, sl ntg, ranexa, crestor, lexapro, lopressor 25 bid, oxy IR prn, prns/ vits/ supps     CXR - CM and vasc congestion   Creat 5.2  K 3.9  BUn 61  Na 130   WBC 26 K  Hb 9.1     LA 5.1 > 2.6 ,  PC 4.0    BCx's neg x 2 < 12 hrs    BP 96/47, HR 80, RR 10--20  afebrile    OP HD: MWF [place] 4h  400/ 600   137.5kg  2/2 bath  RIJ TDC   Hep 5000x 1 - venofer 100 tiw IV thru 12/11 - mircera 100 q2, last 11/20, due 12/04 - last HD 11/29 (Wed), post wt 137.5kg  - dry wt has been dropping     Assessment/ Plan: Sepsis/ shock - w/ lactic acidosis, probable UTI. On IV abx and levo gtt, midodrine.  Broad spectrum coverage pending cultures - urine with enterobacter, blood cx NGTD.    AMS - secondary to sepsis Chronic hypotension - takes midodrine at home. Levo gtt here.  Chronic resp faliure - due to OHS/ OSA/ obesity Volume overload - diffuse severe bilat dependent pitting LE edema > UE's/ abd wall edema. Was quite net neg Sun - ran even yesterday and now concern for hypovolemia -- PCCM  giving volume challenge now CAD h/o CABG 2010 ESRD - usual HD MWF, missed HD Friday. CRRT started 12/02 due to sepsis/ shock and vol overload. Cont CRRT.  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  MBD ckd - CCa and phos in range. Not on any vdra. Follow.     Jannifer Hick MD [facility] Pager [phone]    Recent Labs  Lab 02/23/22 1509 02/24/22 0136 02/24/22 0832 02/24/22 1215  HGB  --  9.4* 10.2* 9.5*  ALBUMIN <1.5* <1.5*  --   --   CALCIUM 8.6* 8.8*  --   --   PHOS 3.7 3.2  --   --   CREATININE 1.96* 1.76*  --   --   K 4.7 4.8 5.0 4.8    No results for input(s): "IRON", "TIBC", "FERRITIN" in the last 168 hours. Inpatient medications:  artificial tears   Both Eyes Q8H   Chlorhexidine Gluconate Cloth  6 each Topical  Daily   darbepoetin (ARANESP) injection - DIALYSIS  100 mcg Subcutaneous Q Mon-1800   docusate  100 mg Per Tube BID   famotidine  10 mg Per Tube Daily   insulin aspart  0-9 Units Subcutaneous Q4H   midodrine  10 mg Per Tube Q8H   multivitamin  1 tablet Per Tube QHS   mouth rinse  15 mL Mouth Rinse 4 times per day   mouth rinse  15 mL Mouth Rinse Q2H   polyethylene glycol  17 g Per Tube Daily     prismasol BGK 4/2.5 400 mL/hr at 02/24/22 0604    prismasol BGK 4/2.5 400 mL/hr at 02/24/22 0604   sodium chloride     sodium chloride Stopped (02/24/22 1243)   ceFEPime (MAXIPIME) IV 200 mL/hr at 02/24/22 1300   DAPTOmycin (CUBICIN) 550 mg in sodium chloride 0.9 % IVPB Stopped (02/23/22 2030)   feeding supplement (VITAL 1.5 CAL) 20 mL/hr at 02/24/22 1300   fentaNYL infusion INTRAVENOUS 200 mcg/hr (02/24/22 1300)   metronidazole Stopped (02/24/22 1226)   norepinephrine (LEVOPHED) Adult infusion 32 mcg/min (02/24/22 1300)   phytonadione (VITAMIN K) 5 mg in dextrose 5 % 50 mL IVPB     prismasol BGK 4/2.5 1,500 mL/hr at 02/24/22 0843   vasopressin 0.04 Units/min (02/24/22 1300)    sodium chloride, acetaminophen, fentaNYL, heparin, lip balm, mouth rinse, mouth rinse

## 2022-02-24 NOTE — Progress Notes (Addendum)
  Transition of Care [facility]) Screening Note   Patient Details  Name: Marlena Barbato Date of Birth: 1965-12-29   Transition of Care [facility]) CM/SW Contact:    Tom-Johnson, Renea Ee, RN Phone Number: 02/24/2022, 3:41 PM  Patient is admitted for Septic Shock, on IV abx, CRRT. Currently intubated.   Transition of Care Department [facility]) has reviewed patient and no TOC needs or recommendations have been identified at this time. TOC will continue to monitor patient advancement through interdisciplinary progression rounds. If new patient transition needs arise, please place a TOC consult.

## 2022-02-25 ENCOUNTER — Inpatient Hospital Stay (HOSPITAL_COMMUNITY): Payer: Medicare Other

## 2022-02-25 DIAGNOSIS — Z789 Other specified health status: Secondary | ICD-10-CM

## 2022-02-25 DIAGNOSIS — R6521 Severe sepsis with septic shock: Secondary | ICD-10-CM | POA: Diagnosis not present

## 2022-02-25 DIAGNOSIS — I2510 Atherosclerotic heart disease of native coronary artery without angina pectoris: Secondary | ICD-10-CM | POA: Diagnosis not present

## 2022-02-25 DIAGNOSIS — Z711 Person with feared health complaint in whom no diagnosis is made: Secondary | ICD-10-CM

## 2022-02-25 DIAGNOSIS — G9341 Metabolic encephalopathy: Secondary | ICD-10-CM | POA: Diagnosis not present

## 2022-02-25 DIAGNOSIS — I5021 Acute systolic (congestive) heart failure: Secondary | ICD-10-CM | POA: Diagnosis not present

## 2022-02-25 DIAGNOSIS — E440 Moderate protein-calorie malnutrition: Secondary | ICD-10-CM | POA: Insufficient documentation

## 2022-02-25 DIAGNOSIS — R609 Edema, unspecified: Secondary | ICD-10-CM | POA: Diagnosis not present

## 2022-02-25 DIAGNOSIS — A419 Sepsis, unspecified organism: Secondary | ICD-10-CM | POA: Diagnosis not present

## 2022-02-25 LAB — CSF CELL COUNT WITH DIFFERENTIAL
RBC Count, CSF: 11 /mm3 — ABNORMAL HIGH
RBC Count, CSF: 775 /mm3 — ABNORMAL HIGH
Tube #: 1
Tube #: 4
WBC, CSF: 1 /mm3 (ref 0–5)
WBC, CSF: 2 /mm3 (ref 0–5)

## 2022-02-25 LAB — CBC
HCT: 24.9 % — ABNORMAL LOW (ref 36.0–46.0)
Hemoglobin: 8.5 g/dL — ABNORMAL LOW (ref 12.0–15.0)
MCH: 27 pg (ref 26.0–34.0)
MCHC: 34.1 g/dL (ref 30.0–36.0)
MCV: 79 fL — ABNORMAL LOW (ref 80.0–100.0)
Platelets: 114 10*3/uL — ABNORMAL LOW (ref 150–400)
RBC: 3.15 MIL/uL — ABNORMAL LOW (ref 3.87–5.11)
RDW: 19.9 % — ABNORMAL HIGH (ref 11.5–15.5)
WBC: 23.1 10*3/uL — ABNORMAL HIGH (ref 4.0–10.5)
nRBC: 1.3 % — ABNORMAL HIGH (ref 0.0–0.2)

## 2022-02-25 LAB — RENAL FUNCTION PANEL
Albumin: 1.5 g/dL — ABNORMAL LOW (ref 3.5–5.0)
Albumin: <1.5 g/dL — ABNORMAL LOW (ref 3.5–5.0)
Anion gap: 12 (ref 5–15)
Anion gap: 15 (ref 5–15)
BUN: 21 mg/dL — ABNORMAL HIGH (ref 6–20)
BUN: 30 mg/dL — ABNORMAL HIGH (ref 6–20)
CO2: 19 mmol/L — ABNORMAL LOW (ref 22–32)
CO2: 15 mmol/L — ABNORMAL LOW (ref 22–32)
Calcium: 8.1 mg/dL — ABNORMAL LOW (ref 8.9–10.3)
Calcium: 8.4 mg/dL — ABNORMAL LOW (ref 8.9–10.3)
Chloride: 102 mmol/L (ref 98–111)
Chloride: 103 mmol/L (ref 98–111)
Creatinine, Ser: 1.42 mg/dL — ABNORMAL HIGH (ref 0.44–1.00)
Creatinine, Ser: 1.74 mg/dL — ABNORMAL HIGH (ref 0.44–1.00)
GFR, Estimated: 43 mL/min — ABNORMAL LOW (ref >60)
GFR, Estimated: 34 mL/min — ABNORMAL LOW (ref >60)
Glucose, Bld: 225 mg/dL — ABNORMAL HIGH (ref 70–99)
Glucose, Bld: 235 mg/dL — ABNORMAL HIGH (ref 70–99)
Phosphorus: 2.7 mg/dL (ref 2.5–4.6)
Phosphorus: 3.7 mg/dL (ref 2.5–4.6)
Potassium: 4.3 mmol/L (ref 3.5–5.1)
Potassium: 4.6 mmol/L (ref 3.5–5.1)
Sodium: 133 mmol/L — ABNORMAL LOW (ref 135–145)
Sodium: 133 mmol/L — ABNORMAL LOW (ref 135–145)

## 2022-02-25 LAB — HEPATIC FUNCTION PANEL
ALT: 51 U/L — ABNORMAL HIGH (ref 0–44)
AST: 111 U/L — ABNORMAL HIGH (ref 15–41)
Albumin: <1.5 g/dL — ABNORMAL LOW (ref 3.5–5.0)
Alkaline Phosphatase: 111 U/L (ref 38–126)
Bilirubin, Direct: 4.2 mg/dL — ABNORMAL HIGH (ref 0.0–0.2)
Indirect Bilirubin: 2 mg/dL — ABNORMAL HIGH (ref 0.3–0.9)
Total Bilirubin: 6.2 mg/dL — ABNORMAL HIGH (ref 0.3–1.2)
Total Protein: 5.5 g/dL — ABNORMAL LOW (ref 6.5–8.1)

## 2022-02-25 LAB — FERRITIN: Ferritin: 780 ng/mL — ABNORMAL HIGH (ref 11–307)

## 2022-02-25 LAB — MENINGITIS/ENCEPHALITIS PANEL (CSF)

## 2022-02-25 LAB — CULTURE, BLOOD (ROUTINE X 2)
Culture: NO GROWTH
Culture: NO GROWTH

## 2022-02-25 LAB — TROPONIN I (HIGH SENSITIVITY)
Troponin I (High Sensitivity): 1984 ng/L (ref <18)
Troponin I (High Sensitivity): 1473 ng/L (ref <18)

## 2022-02-25 LAB — GLUCOSE, CAPILLARY
Glucose-Capillary: 213 mg/dL — ABNORMAL HIGH (ref 70–99)
Glucose-Capillary: 224 mg/dL — ABNORMAL HIGH (ref 70–99)
Glucose-Capillary: 219 mg/dL — ABNORMAL HIGH (ref 70–99)
Glucose-Capillary: 205 mg/dL — ABNORMAL HIGH (ref 70–99)
Glucose-Capillary: 217 mg/dL — ABNORMAL HIGH (ref 70–99)
Glucose-Capillary: 272 mg/dL — ABNORMAL HIGH (ref 70–99)

## 2022-02-25 LAB — COOXEMETRY PANEL
Carboxyhemoglobin: 1.3 % (ref 0.5–1.5)
Methemoglobin: <0.7 % (ref 0.0–1.5)
O2 Saturation: 98.8 %
Total hemoglobin: 9.1 g/dL — ABNORMAL LOW (ref 12.0–16.0)

## 2022-02-25 LAB — LACTIC ACID, PLASMA
Lactic Acid, Venous: 4.8 mmol/L (ref 0.5–1.9)
Lactic Acid, Venous: 7.6 mmol/L (ref 0.5–1.9)
Lactic Acid, Venous: 6.3 mmol/L (ref 0.5–1.9)

## 2022-02-25 LAB — TSH: TSH: 3.546 u[IU]/mL (ref 0.350–4.500)

## 2022-02-25 LAB — PROTEIN AND GLUCOSE, CSF
Glucose, CSF: 178 mg/dL — ABNORMAL HIGH (ref 40–70)
Total  Protein, CSF: 42 mg/dL (ref 15–45)

## 2022-02-25 LAB — APTT: aPTT: 51 seconds — ABNORMAL HIGH (ref 24–36)

## 2022-02-25 LAB — MAGNESIUM: Magnesium: 2.4 mg/dL (ref 1.7–2.4)

## 2022-02-25 MED ORDER — INSULIN ASPART 100 UNIT/ML IJ SOLN
0.0000 [IU] | INTRAMUSCULAR | Status: DC
  Administered 2022-02-25: 3 [IU] via SUBCUTANEOUS
  Administered 2022-02-25: 8 [IU] via SUBCUTANEOUS
  Administered 2022-02-25: 5 [IU] via SUBCUTANEOUS
  Administered 2022-02-25: 3 [IU] via SUBCUTANEOUS
  Administered 2022-02-26: 2 [IU] via SUBCUTANEOUS
  Administered 2022-02-26 (×2): 3 [IU] via SUBCUTANEOUS
  Administered 2022-02-26 (×3): 5 [IU] via SUBCUTANEOUS
  Administered 2022-02-27: 3 [IU] via SUBCUTANEOUS
  Administered 2022-02-27: 5 [IU] via SUBCUTANEOUS
  Administered 2022-02-27: 2 [IU] via SUBCUTANEOUS
  Administered 2022-02-27: 8 [IU] via SUBCUTANEOUS

## 2022-02-25 MED ORDER — LINEZOLID 600 MG/300ML IV SOLN
600.0000 mg | Freq: Two times a day (BID) | INTRAVENOUS | Status: DC
  Administered 2022-02-25 (×2): 600 mg via INTRAVENOUS
  Filled 2022-02-25 (×3): qty 300

## 2022-02-25 MED ORDER — SODIUM CHLORIDE 0.9 % IV SOLN
500.0000 [IU]/h | INTRAVENOUS | Status: DC
  Administered 2022-02-25: 500 [IU]/h via INTRAVENOUS_CENTRAL
  Administered 2022-02-26 – 2022-02-27 (×3): 750 [IU]/h via INTRAVENOUS_CENTRAL
  Filled 2022-02-25: qty 10000
  Filled 2022-02-25 (×2): qty 2
  Filled 2022-02-25: qty 10000

## 2022-02-25 MED ORDER — HYDROCORTISONE SOD SUC (PF) 100 MG IJ SOLR
100.0000 mg | Freq: Three times a day (TID) | INTRAMUSCULAR | Status: DC
  Administered 2022-02-25 – 2022-02-26 (×3): 100 mg via INTRAVENOUS
  Filled 2022-02-25 (×5): qty 2

## 2022-02-25 MED ORDER — DOCUSATE SODIUM 50 MG/5ML PO LIQD
200.0000 mg | Freq: Two times a day (BID) | ORAL | Status: DC
  Administered 2022-02-25 – 2022-02-26 (×3): 200 mg
  Filled 2022-02-25 (×3): qty 20

## 2022-02-25 MED ORDER — POLYETHYLENE GLYCOL 3350 17 G PO PACK
17.0000 g | PACK | Freq: Two times a day (BID) | ORAL | Status: DC
  Administered 2022-02-25 – 2022-02-26 (×3): 17 g
  Filled 2022-02-25 (×3): qty 1

## 2022-02-25 MED ORDER — SENNOSIDES 8.8 MG/5ML PO SYRP
10.0000 mL | ORAL_SOLUTION | Freq: Two times a day (BID) | ORAL | Status: DC
  Administered 2022-02-25 – 2022-02-26 (×3): 10 mL
  Filled 2022-02-25 (×3): qty 10
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
## 2022-02-25 NOTE — Progress Notes (Signed)
NAME:  [name], MRN:  [id number], DOB:  Jul 10, 1965, LOS: 5 ADMISSION DATE:  03/01/2022, CONSULTATION DATE:  03/05/2022 REFERRING MD:  Marda Stalker, CHIEF COMPLAINT:  AMS   History of Present Illness:  [name] is a 56y.o. female with a significant PMH for DM2, HTN, HLD, Morbid Obesity, OSA, ESRD, CAD/CABG, Chronic Diastolic CHF. Patient was brought to [facility] from [facility] where she had been admitted for three days. At baseline, she is A/Ox4 and can ambulate with assistance. She was recently diagnosed with a UTI (11/28) and was given Cipro as well as a gram of Rocephin. She is a on iHD MWF, and missed her last two sessions, except for (12/1).   Pertinent labs in the ER showed WBC 21.9, Cr 5.00, AST 141 ALT 51, LA 6.1. UA positive for leukocytes, proteins, and many bacteria. CT Head showed NAICA. Chest Xray showed CHF versus volume overload. Patient did receive Linezolid prior to Blood Cultures being collected.  BC pending.   PCCM consulted for AMS in the setting of septic shock.   Pertinent  Medical History  Unable to obtain d/t AMS.   Significant Hospital Events: Including procedures, antibiotic start and stop dates in addition to other pertinent events   12/1 Admitted  12/1 CVC and arterial catheter placed 12/2 Started Precedex Norepinephrine 11-15 12/4 Worsening Mental Status and pressor requirements   Interim History / Subjective:  Intubated yesterday r/t poor mental status and inability to protect airway  Mental status remains poor  Ongoing shock - On levophed 20, vasopressin  On CRRT  Some mild agitation off low dose fentanyl but non-purposeful   Objective   Blood pressure (!) 106/46, pulse 95, temperature 99.1 F (37.3 C), temperature source Axillary, resp. rate (!) 30, weight 128.6 kg, SpO2 100 %.    Vent Mode: PRVC FiO2 (%):  [40 %-100 %] 40 % Set Rate:  [30 bmp] 30 bmp Vt Set:  [490 mL] 490 mL PEEP:  [5 cmH20] 5 cmH20 Plateau Pressure:  [20  cmH20-22 cmH20] 20 cmH20   Intake/Output Summary (Last 24 hours) at 02/25/2022 0929 Last data filed at 02/25/2022 0800 Gross per 24 hour  Intake 5292.07 ml  Output 2373 ml  Net 2919.07 ml   Filed Weights   02/23/22 0500 02/24/22 0219 02/25/22 0500  Weight: 130.4 kg 127.7 kg 128.6 kg   Physical Examination: General: acutely ill appearing female NAD HEENT: mm moist, no JVD, ETT, pupils 19m NR, hazy film R eye  Neuro: lethargic, sedation currently off but was on fent 50, some non purposeful movement, agitation overnight, none on my exam. Blinks when eyes flushed, moves upper ext spontaneously CV: RRR, no m/g/r. PULM: resps even non labored on vent, clear, diminished bases  GI: Soft, nontender, nondistended. Hypoactive  bowel sounds. Extremities: Mild LE edema noted. Multiple scattered BLE blisters  Skin: Warm/dry, Chronic Lichenification of pretibial skin, multiple toes amputated   Resolved Hospital Problem list     Assessment & Plan:  Shock - presumed septic secondary to UTI ?other etiology given ongoing shock despite adequate abx coverage UC positive for enterobacter aerogenes  Ongoing shock, rising lactate concerning for other source. CT chest/abd/pelvis unrevealing. Distended gallbladder but RUQ u/s nonspecific. Diverticulosis and ?mild diverticulitis but on D6 abx, flagyl added 12/5. Cultures neg to date. Of note -New EF 25-30% (previously 55-60% in August)  Co-Ox 90's Plan -Continue daptomycin and cefepime  -trial stress steroids -Wean norepinephrine and vaso as able, MAP goal 65 -BP increased with passive leg  raise, will give volume and gauge response Albumin as well. -trending lactate 6>2>3>2>4 -holding further volume removal - keep even with CRRT  -troponin, TSH, repeat co-ox, repeat lactate  -BLE venous doppler  Acute Metabolic Encephalopathy secondary to above > complicated by uremia.  Some concern that there may be a primary neurological source, meningitis although no  meningismus.   Plan MRI pending  CT head neg  Will ask for neuro input  Wean sedation as able  Acute on chronic respiratory failure in setting AMS, failure to protect airway with underlying OHS/OSA Vent support - 8cc/kg  F/u CXR  F/u ABG Daily SBT  Qhs CPAP once extubated    Chronic Diastolic HF CAD s/p CABG 3875 New EF 23-30% Plan -Careful with volume shifts - keeping even for now with CRRT  - Hold Effient  - Holding statin again d/t increased CK - Holding fenofibrate ranexa, metoprolol  - troponin as above  - consider cards input if ongoing shock   ESRD Plan -Tolerating CVVHD, currently EVEN -Appreciate nephrology assistance and management -Follow BMP, no current urine output  DM2 Plan -Sliding scale insulin as per protocol -CBG goal 140-180  Chronic Hypotension Plan -Continue midodrine 10 mg 3 times daily  Right Foot Diabetic Ulcer/Lichenification  Plan - Appreciate WOC - Appreciate VVS > cont Prevalon boots/floating heels, consider lower extremity arterial evaluation with angiography once stabilized    Coagulapathy DIC Panel > Elevated Fibrinogen, D-Dimer  Plan - Stopped SQ Heparin - continue heparin in CRRT to avoid filter clotting but continue to hold systemic heparin and anti-plt (hx DES) - TEG Ordered  Best Practice (right click and "Reselect all SmartList Selections" daily)   Diet/type: CorTrak TF DVT prophylaxis: holding d/t bleeding GI prophylaxis: PPI Lines: Tunneled Dialysis Catheter, Central Line, Aline Foley:  N/A Code Status:  full code Last date of multidisciplinary goals of care discussion: PMT consulted 12/5  Labs   CBC: Recent Labs  Lab 02/22/2022 1100 03/17/2022 1320 02/21/22 0419 02/22/22 0345 02/23/22 0330 02/23/22 0931 02/24/22 0136 02/24/22 0832 02/24/22 1215 02/25/22 0335  WBC 21.9*   < > 26.2* 26.3* 24.7*  --  26.0*  --   --  23.1*  NEUTROABS 19.2*  --   --   --   --   --   --   --   --   --   HGB 8.9*   < > 9.1*  9.4* 9.2* 10.2* 9.4* 10.2* 9.5* 8.5*  HCT 26.1*   < > 26.2* 27.0* 28.2* 30.0* 29.1* 30.0* 28.0* 24.9*  MCV 79.1*   < > 76.6* 76.5* 80.6  --  81.3  --   --  79.0*  PLT 136*   < > 150 151 154  --  166  169  --   --  114*   < > = values in this interval not displayed.    Basic Metabolic Panel: Recent Labs  Lab 02/21/22 0419 02/21/22 1600 02/22/22 0345 02/22/22 1600 02/23/22 0330 02/23/22 0931 02/23/22 1509 02/24/22 0136 02/24/22 0832 02/24/22 1215 02/24/22 1703 02/25/22 0335  NA 130*   < > 134*   < > 133*   < > 134* 135 136 137 133* 133*  K 3.9   < > 4.2   < > 4.1   < > 4.7 4.8 5.0 4.8 4.6 4.3  CL 92*   < > 100   < > 101  --  103 101  --   --  104 102  CO2 22   < >  20*   < > 20*  --  20* 17*  --   --  17* 19*  GLUCOSE 257*   < > 133*   < > 122*  --  129* 122*  --   --  196* 225*  BUN 61*   < > 45*   < > 27*  --  25* 22*  --   --  22* 21*  CREATININE 5.26*   < > 3.65*   < > 2.27*  --  1.96* 1.76*  --   --  1.51* 1.42*  CALCIUM 8.7*   < > 8.1*   < > 8.2*  --  8.6* 8.8*  --   --  8.4* 8.1*  MG 2.2  --  2.3  --  2.3  --   --  2.5*  --   --   --  2.4  PHOS  --    < > 4.5   < > 3.5  --  3.7 3.2  --   --  3.1 2.7   < > = values in this interval not displayed.   GFR: Estimated Creatinine Clearance: 61 mL/min (A) (by C-G formula based on SCr of 1.42 mg/dL (H)). Recent Labs  Lab 03/08/2022 1609 03/20/2022 2023 02/21/22 0419 02/22/22 0345 02/23/22 0330 02/24/22 0136 02/24/22 0916 02/24/22 1703 02/24/22 1950 02/25/22 0335  PROCALCITON 3.68  --  4.00 2.97  --  4.07  --   --   --   --   WBC 24.3*  --  26.2* 26.3* 24.7* 26.0*  --   --   --  23.1*  LATICACIDVEN  --    < > 2.6* 3.2* 2.4*  --  7.6* 4.9* 4.5*  --    < > = values in this interval not displayed.    Liver Function Tests: Recent Labs  Lab 02/23/2022 1100 02/21/22 1600 02/23/22 0330 02/23/22 1509 02/24/22 0136 02/24/22 1703 02/25/22 0335  AST 141*  --   --   --   --   --   --   ALT 51*  --   --   --   --   --   --    ALKPHOS 112  --   --   --   --   --   --   BILITOT 4.6*  --   --   --   --   --   --   PROT 6.7  --   --   --   --   --   --   ALBUMIN 2.0*   < > <1.5* <1.5* <1.5* <1.5* 1.5*   < > = values in this interval not displayed.   No results for input(s): "LIPASE", "AMYLASE" in the last 168 hours. Recent Labs  Lab 02/21/2022 1100  AMMONIA 32    ABG    Component Value Date/Time   PHART 7.361 02/24/2022 1215   PCO2ART 33.4 02/24/2022 1215   PO2ART 449 (H) 02/24/2022 1215   HCO3 18.9 (L) 02/24/2022 1215   TCO2 20 (L) 02/24/2022 1215   ACIDBASEDEF 6.0 (H) 02/24/2022 1215   O2SAT 90.3 02/24/2022 2221     Coagulation Profile: Recent Labs  Lab 03/16/2022 1100 02/24/22 0136  INR 2.1* 2.0*    Cardiac Enzymes: Recent Labs  Lab 02/24/22 0136  CKTOTAL 430*    HbA1C: Hgb A1c MFr Bld  Date/Time Value Ref Range Status  10/25/2021 01:46 AM 6.9 (H) 4.8 - 5.6 % Final  Comment:    (NOTE) Pre diabetes:          5.7%-6.4%  Diabetes:              >6.4%  Glycemic control for   <7.0% adults with diabetes   06/28/2020 02:42 PM 7.5 (H) 4.8 - 5.6 % Final    Comment:    REPEATED TO VERIFY (NOTE) Pre diabetes:          5.7%-6.4%  Diabetes:              >6.4%  Glycemic control for   <7.0% adults with diabetes     CBG: Recent Labs  Lab 02/24/22 1924 02/24/22 2336 02/24/22 2346 02/25/22 0333 02/25/22 0749  GLUCAP 83 93 216* 213* 224*    Critical care time:  31mns    Katy Keimari , NP Pulmonary/Critical Care Medicine  02/25/2022  9:29 AM

## 2022-02-25 NOTE — Progress Notes (Signed)
Inpatient Diabetes Program Recommendations  AACE/ADA: New Consensus Statement on Inpatient Glycemic Control (2015)  Target Ranges:  Prepandial:   less than 140 mg/dL      Peak postprandial:   less than 180 mg/dL (1-2 hours)      Critically ill patients:  140 - 180 mg/dL   Lab Results  Component Value Date   GLUCAP 224 (H) 02/25/2022   HGBA1C 6.9 (H) 10/25/2021    Review of Glycemic Control  Latest Reference Range & Units 02/24/22 23:36 02/24/22 23:46 02/25/22 03:33 02/25/22 07:49  Glucose-Capillary 70 - 99 mg/dL 93 216 (H) 213 (H) 224 (H)   Diabetes history: DM Outpatient Diabetes medications:  Humalog 2-10 units tid with meals Current orders for Inpatient glycemic control:  Novolog 0-15 units q 4 hours (increased today) Solucortef 100 mg q 8 hours Vital 20 ml/hr Inpatient Diabetes Program Recommendations:    Note that blood sugars increased and steroids started.  If blood sugars continue to be > 180 mg/dL, consider adding Levemir 8 units bid.    Thanks,  Adah Perl, RN, BC-ADM Inpatient Diabetes Coordinator Pager [phone]  (8a-5p)

## 2022-02-25 NOTE — Progress Notes (Signed)
Pharmacy ICU Insulin Management  CBGs above goal 140-180 mg/dL: Yes  Current regimen (include units of SSI in last 24hr): sSSI (8u/24)  Medications affecting CBG levels: -D10 ran overnight '@50'$ /hr (now d/c) -TF restarted (Vital 1.5 @ 20/hr- trickle d/t pressor requirements) -starting stress dose steroids - hydrocortisone 100 q8h   Plan:. -increase to mSSI  -will evaluate insulin needs off D10 and on stress-dose steroids and adjust PRN   Wilson Singer, PharmD Clinical Pharmacist 02/25/2022 9:03 AM

## 2022-02-25 NOTE — Procedures (Signed)
Lumbar Puncture Procedure Note  [name]  [id number]  [birth year]  Date:02/25/22  Time:3:47 PM   Provider Performing:Angila Wombles   Procedure: Lumbar Puncture (32919)  Indication(s) Rule out meningitis  Consent Risks of the procedure as well as the alternatives and risks of each were explained to the patient and/or caregiver.  Consent for the procedure was obtained and is signed in the bedside chart  Anesthesia Topical only with 1% lidocaine    Time Out Verified patient identification, verified procedure, site/side was marked, verified correct patient position, special equipment/implants available, medications/allergies/relevant history reviewed, required imaging and test results available.   Sterile Technique Maximal sterile technique including sterile barrier drape, hand hygiene, sterile gown, sterile gloves, mask, hair covering.    Procedure Description Using palpation, approximate location of L3-L4 space identified.   Lidocaine used to anesthetize skin and subcutaneous tissue overlying this area.  A 20g spinal needle was then used to access the subarachnoid space. Opening pressure: 14 cm H2O. Closing pressure:Not obtained. 10 cc straw colored CSF obtained.  Complications/Tolerance None; patient tolerated the procedure well.   EBL Minimal   Specimen(s) CSF

## 2022-02-25 NOTE — Consult Note (Addendum)
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 radiograph 02/24/2022. Pelvic ultrasound 04/05/2018. Left hip CT 12/25/2021. FINDINGS: CT CHEST FINDINGS Cardiovascular: Thoracic aortic atherosclerosis without aneurysm. Status post CABG. Coronary atherosclerosis. Mild cardiomegaly. No pericardial effusion. Bilateral internal jugular central venous catheters terminating in the lower SVC. Mediastinum/Nodes: No enlarged axillary, mediastinal, or hilar lymph nodes. Unremarkable thyroid. Nondistended esophagus with enteric tube in place. Endotracheal tube terminating above the carina. Lungs/Pleura: Small bilateral pleural effusions. No  pneumothorax. Respiratory motion artifact. Dependent opacities in both lower lobes suggesting atelectasis. Musculoskeletal: No acute osseous abnormality or suspicious osseous lesion. Disc degeneration throughout the mid and upper thoracic spine. CT ABDOMEN PELVIS FINDINGS Hepatobiliary: No focal liver abnormality is identified within limitations of artifact from motion and the patient's arms. The gallbladder is moderately distended and contains high density fluid and suspected stones. No significant pericholecystic inflammation. Pancreas: Unremarkable. Spleen: Grossly unremarkable though with assessment limited by motion and streak artifact. Adrenals/Urinary Tract: Unremarkable adrenal glands. No hydronephrosis. Vascular calcifications in both renal hila. 9 mm hypodensity in the interpolar right kidney,  too small to fully characterize though most likely representing a cyst and with no follow-up imaging recommended. Diffuse bladder wall thickening with infiltration of the surrounding fat. Stomach/Bowel: An enteric tube terminates in the distal aspect of the stomach. There is no evidence of bowel obstruction. There is left-sided colonic diverticulosis with mild stranding adjacent to the proximal sigmoid colon but without substantial bowel wall thickening allowing for under distension. The appendix is possibly visualized posterior and medial to the cecum. There are no findings of acute appendicitis. Vascular/Lymphatic: Abdominal aortic atherosclerosis without aneurysm. No enlarged lymph nodes. Reproductive: Calcified uterine fibroids. No suspicious adnexal mass. Other: Small volume perihepatic free fluid. Diffuse body wall edema. Musculoskeletal: Severe L4-5 facet arthrosis. Moderate lower lumbar disc degeneration. IMPRESSION: 1. Small bilateral pleural effusions with bilateral lower lobe atelectasis. 2. Diffuse bladder wall thickening with surrounding inflammatory stranding, correlate for cystitis. 3. Colonic  diverticulosis with mild stranding adjacent to the proximal sigmoid colon, indeterminate for mild diverticulitis. 4. Anasarca with small volume ascites. 5. Distended gallbladder containing high density fluid and suspected stones. Consider right upper quadrant ultrasound if there is clinical concern for acute cholecystitis. 6.  Aortic Atherosclerosis (ICD10-I70.0). Electronically Signed   By: Logan Bores M.D.   On: 02/24/2022 16:17   CT HEAD WO CONTRAST (5MM)  Result Date: 02/24/2022 CLINICAL DATA:  Mental status change, unknown cause EXAM: CT HEAD WITHOUT CONTRAST TECHNIQUE: Contiguous axial images were obtained from the base of the skull through the vertex without intravenous contrast. RADIATION DOSE REDUCTION: This exam was performed according to the departmental dose-optimization program which includes automated exposure control, adjustment of the mA and/or kV according to patient size and/or use of iterative reconstruction technique. COMPARISON:  03/12/2022 FINDINGS: Brain: No evidence of acute infarction, hemorrhage, mass, mass effect, or midline shift. No hydrocephalus or extra-axial fluid collection. Vascular: No hyperdense vessel. Skull: Normal. Negative for fracture or focal lesion. Sinuses/Orbits: Small mucous retention cyst in the left maxillary sinus. Otherwise clear paranasal sinuses. No acute finding in the orbits. Other: The mastoid air cells are well aerated. Partially imaged nasogastric and endotracheal tubes. IMPRESSION: No acute intracranial process. Electronically Signed   By: Merilyn Baba M.D.   On: 02/24/2022 15:37   ECHOCARDIOGRAM LIMITED  Result Date: 02/24/2022    ECHOCARDIOGRAM LIMITED REPORT   Patient Name:   [name] Date of Exam: 02/24/2022 Medical Rec #:  [id number]      Height:       67.0 in Accession #:    [id number]     Weight:       281.5 lb Date of Birth:  03/20/1966       BSA:          2.339 m Patient Age:    74 years       BP:           118/38 mmHg Patient Gender: F               HR:           95 bpm. Exam Location:  Inpatient Procedure: Limited Echo, Limited Color Doppler, Cardiac Doppler and Intracardiac            Opacification Agent Indications:    [facility]) [[id number]]  History:        Patient has prior history of Echocardiogram examinations, most                 recent 10/25/2021. CHF, CAD; Risk Factors:Sleep Apnea,  Hypertension, Dyslipidemia and Diabetes.  Sonographer:    Bernadene Person RDCS Referring Phys: [id number]  1. Left ventricular ejection fraction, by estimation, is 25 to 30%. The left ventricle has severely decreased function. The left ventricle demonstrates regional wall motion abnormalities (see scoring diagram/findings for description). Indeterminate diastolic filling due to E-A fusion.  2. Right ventricular systolic function is moderately reduced. The right ventricular size is moderately enlarged.  3. The mitral valve is grossly normal. Trivial mitral valve regurgitation. No evidence of mitral stenosis.  4. The aortic valve is tricuspid. Aortic valve regurgitation is not visualized. No aortic stenosis is present. Comparison(s): Changes from prior study are noted. Distal septum and apex are akinetic. Inferior wall appears akinetic. All other segments are severely hypokinetic. EF is now reduced 25-30%. Conclusion(s)/Recommendation(s): Findings consistent with ischemic cardiomyopathy. FINDINGS  Left Ventricle: Left ventricular ejection fraction, by estimation, is 25 to 30%. The left ventricle has severely decreased function. The left ventricle demonstrates regional wall motion abnormalities. Definity contrast agent was given IV to delineate the left ventricular endocardial borders. The left ventricular internal cavity size was normal in size. There is no left ventricular hypertrophy. Indeterminate diastolic filling due to E-A fusion.  LV Wall Scoring: The entire inferior wall, apical septal segment, apical anterior segment, and  apex are akinetic. Right Ventricle: The right ventricular size is moderately enlarged. No increase in right ventricular wall thickness. Right ventricular systolic function is moderately reduced. Pericardium: Trivial pericardial effusion is present. Mitral Valve: The mitral valve is grossly normal. Trivial mitral valve regurgitation. No evidence of mitral valve stenosis. Tricuspid Valve: The tricuspid valve is grossly normal. Tricuspid valve regurgitation is trivial. No evidence of tricuspid stenosis. Aortic Valve: The aortic valve is tricuspid. Aortic valve regurgitation is not visualized. No aortic stenosis is present. Pulmonic Valve: The pulmonic valve was grossly normal. Pulmonic valve regurgitation is trivial. No evidence of pulmonic stenosis. Aorta: The aortic root is normal in size and structure. Additional Comments: Spectral Doppler performed. Color Doppler performed.  LEFT VENTRICLE PLAX 2D LVIDd:         5.80 cm      Diastology LVIDs:         3.80 cm      LV e' medial:    4.65 cm/s LV PW:         0.90 cm      LV E/e' medial:  20.0 LV IVS:        0.80 cm      LV e' lateral:   5.18 cm/s LVOT diam:     2.00 cm      LV E/e' lateral: 17.9 LV SV:         62 LV SV Index:   26 LVOT Area:     3.14 cm  LV Volumes (MOD) LV vol d, MOD A2C: 167.0 ml LV vol d, MOD A4C: 147.0 ml LV vol s, MOD A2C: 116.0 ml LV vol s, MOD A4C: 114.0 ml LV SV MOD A2C:     51.0 ml LV SV MOD A4C:     147.0 ml LV SV MOD BP:      42.7 ml RIGHT VENTRICLE RV S prime:     5.78 cm/s TAPSE (M-mode): 1.1 cm LEFT ATRIUM         Index LA diam:    4.00 cm 1.71 cm/m  AORTIC VALVE LVOT Vmax:   140.00 cm/s LVOT Vmean:  86.500 cm/s LVOT VTI:    0.196 m  AORTA  Ao Root diam: 2.80 cm MITRAL VALVE MV Area (PHT): 5.38 cm    SHUNTS MV Decel Time: 141 msec    Systemic VTI:  0.20 m MV E velocity: 92.80 cm/s  Systemic Diam: 2.00 cm MV A velocity: 73.40 cm/s MV E/A ratio:  1.26 Eleonore Chiquito MD Electronically signed by Eleonore Chiquito MD Signature Date/Time:  02/24/2022/2:34:06 PM    Final      Medications:     Current Medications:  artificial tears   Both Eyes Q8H   Chlorhexidine Gluconate Cloth  6 each Topical Daily   darbepoetin (ARANESP) injection - DIALYSIS  100 mcg Subcutaneous Q Mon-1800   docusate  200 mg Per Tube BID   famotidine  10 mg Per Tube Daily   hydrocortisone sod succinate (SOLU-CORTEF) inj  100 mg Intravenous Q8H   insulin aspart  0-15 Units Subcutaneous Q4H   midodrine  10 mg Per Tube Q8H   multivitamin  1 tablet Per Tube QHS   mouth rinse  15 mL Mouth Rinse 4 times per day   mouth rinse  15 mL Mouth Rinse Q2H   polyethylene glycol  17 g Per Tube BID   sennosides  10 mL Per Tube BID    Infusions:   prismasol BGK 4/2.5 400 mL/hr at 02/25/22 0457    prismasol BGK 4/2.5 400 mL/hr at 02/25/22 0456   sodium chloride     sodium chloride Stopped (02/25/22 1050)   ceFEPime (MAXIPIME) IV 2 g (02/25/22 1327)   DAPTOmycin (CUBICIN) 550 mg in sodium chloride 0.9 % IVPB Stopped (02/24/22 2035)   feeding supplement (VITAL 1.5 CAL) 20 mL/hr at 02/25/22 1200   fentaNYL infusion INTRAVENOUS 200 mcg/hr (02/25/22 1200)   heparin 10,000 units/ 20 mL infusion syringe     metronidazole Stopped (02/25/22 1120)   norepinephrine (LEVOPHED) Adult infusion 22 mcg/min (02/25/22 1310)   prismasol BGK 4/2.5 1,500 mL/hr at 02/25/22 0540   vasopressin Stopped (02/25/22 1111)      Assessment/Plan   Shock, septic with likely cardiogenic component -Initially thought d/t UTI. UC + for enterobacter. Blood cultures negative but had already received abx -No clear source on imaging -Abx with dapto and cefepime per CCM - ID has been consulted -Lactic acid had been clearing, now trending back up (4.8 this am).  -Echo with newly reduced EF 25-30%. Known extensive CAD and elevated troponin at 1,984, recheck pending. New Q waves in inferior and lateral leads. Suspect she occluded her vein grafts within the last few days -Currently on NE 22 + vaso  0.04. CO-OX likely falsely elevated. Trend lactic acid. -Not currently a candidate for any invasive cardiac workup.  -Given her comorbidities, poor baseline functional status and MSOF, would consider GOC discussion  2. Extensive CAD with elevated troponin -S/p CABG in 2011  -Recurrent in-stent restenosis SVG to LAD treated with multiple PCIs -Most recent cath 08/23: occluded native RCA and SVG to RCA, patent SVG to LAD, patent SVG to D, diffuse disease Lcx. No targets for revascularization. -Hs troponin 1984, trend. New Q waves inferior and lateral leads. As above suspect she occluded her grafts within last few days -Not currently a candidate for invasive workup  3. Acute respiratory failure with hypoxia -Intubated 12/05 -Vent management per CCM  4. Altered mental status -Possibly secondary to sepsis but growing concern for CNS issue -CT head unrevealing -MRI brain incomplete suppression of sulcal fluid signal on FLAIR sequences along b/l occipital and temporal convexities which could be artifactual or seen with  meningitis -Considering LP -Neuro has been consulted  5. ESRD on HD -HD M, W, F -CRRT started 12/02 -Nephrology following  6. 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 Has MSOF and shock in setting of multiple comorbidities. Consider [place] discussion.  Length of Stay: 5  Adley Castello, [place], PA-C  02/25/2022, 1:31 PM  Advanced Heart Failure Team Pager [phone] (M-F; 7a - 5p)  Please contact [facility] for night-coverage after hours (4p -7a ) and weekends on [email or web address]

## 2022-02-25 NOTE — Progress Notes (Signed)
Brief Palliative Medicine Progress Note:  PMT following peripherally for needs/decline.  Medical records reviewed including progress notes, labs, imaging. Patient continues to have multiorgan dysfunction and persistent shock/encephalopathy. She remains intubated, sedated, on pressors and CRRT. CCM, nephrology, ID, cardiology all on board - still no clear unifying diagnosis; however, per ID MRI was concerning for meningitis - antibiotic adjustments made. LP pending.   Family meeting scheduled for tomorrow 12/7 at 3:00p.  Thank you for allowing PMT to assist in the care of this patient.  Rohith Fauth M. Tamala Julian [facility] Palliative Medicine Team Team Phone: [phone] NO CHARGE
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
## 2022-02-25 NOTE — Progress Notes (Signed)
[facility] Progress Note  Subjective: pt seen in ICU. Net +3L yesterday - given volume challenge in setting of worsening shock.   NE weaned a bit to 25 + vaso.   Remains intubated.   CT head yesterday unrevealing, going for MRI. CRRT clotted circuit this AM - heparin in circuit has been reordered.   Vitals:   02/25/22 0700 02/25/22 0750 02/25/22 0800 02/25/22 0808  BP: (!) 91/36  (!) 80/30 (!) 106/46  Pulse: 95  94 95  Resp: (!) 30  (!) 30 (!) 30  Temp:  99.1 F (37.3 C)    TempSrc:  Axillary    SpO2: 100%  100% 100%  Weight:        Exam: Gen intubated, obese No rash, cyanosis or gangrene No jvd or bruits Chest clear bilat to bases, no rales/ wheezing RRR no MRG Abd soft ntnd no mass or ascites +bs Ext diffuse 1+ bilat hip > pretib/ abd wall edema, LE bullous lesions noted on inner thighs - quite large ~ 5in, R has dressing Neuro lethargic    RIJ TDC in place          Home meds include - albuterol, aspirin, effient, uloric, fenofibrate, insulin lispro, midodrine 10 tid, sl ntg, ranexa, crestor, lexapro, lopressor 25 bid, oxy IR prn, prns/ vits/ supps     CXR - CM and vasc congestion   Creat 5.2  K 3.9  BUn 61  Na 130   WBC 26 K  Hb 9.1     LA 5.1 > 2.6 ,  PC 4.0    BCx's neg x 2 < 12 hrs    BP 96/47, HR 80, RR 10--20  afebrile    OP HD: MWF [place] 4h  400/ 600   137.5kg  2/2 bath  RIJ TDC   Hep 5000x 1 - venofer 100 tiw IV thru 12/11 - mircera 100 q2, last 11/20, due 12/04 - last HD 11/29 (Wed), post wt 137.5kg  - dry wt has been dropping     Assessment/ Plan: Sepsis/ shock - w/ lactic acidosis, UTI. On IV abx and levo gtt, midodrine.  Broad spectrum coverage  - urine with enterobacter, blood cx NGTD.    AMS - secondary to sepsis though concern for CNS issue - CT unrevealing going for MRI today.  Chronic hypotension - takes midodrine at home. Levo gtt here.  Chronic resp faliure - due to OHS/ OSA/ obesity Volume  - run even today.   CAD h/o CABG  2010 ESRD - usual HD MWF, missed HD Friday. CRRT started 12/02 due to sepsis/ shock and vol overload. Cont CRRT.  Primary added back circuit heparin 12/6 after clotted.  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 Transfuse < 8 MBD ckd - CCa and phos in range. Not on any vdra. Follow.     Jannifer Hick MD [facility] Pager [phone]    Recent Labs  Lab 02/24/22 1215 02/24/22 1703 02/25/22 0335  HGB 9.5*  --  8.5*  ALBUMIN  --  <1.5* 1.5*  CALCIUM  --  8.4* 8.1*  PHOS  --  3.1 2.7  CREATININE  --  1.51* 1.42*  K 4.8 4.6 4.3    No results for input(s): "IRON", "TIBC", "FERRITIN" in the last 168 hours. Inpatient medications:  artificial tears   Both Eyes Q8H   Chlorhexidine Gluconate Cloth  6 each  Topical Daily   darbepoetin (ARANESP) injection - DIALYSIS  100 mcg Subcutaneous Q Mon-1800   docusate  200 mg Per Tube BID   famotidine  10 mg Per Tube Daily   hydrocortisone sod succinate (SOLU-CORTEF) inj  100 mg Intravenous Q8H   insulin aspart  0-9 Units Subcutaneous Q4H   midodrine  10 mg Per Tube Q8H   multivitamin  1 tablet Per Tube QHS   mouth rinse  15 mL Mouth Rinse 4 times per day   mouth rinse  15 mL Mouth Rinse Q2H   polyethylene glycol  17 g Per Tube BID   sennosides  10 mL Per Tube BID     prismasol BGK 4/2.5 400 mL/hr at 02/25/22 0457    prismasol BGK 4/2.5 400 mL/hr at 02/25/22 0456   sodium chloride     sodium chloride 10 mL/hr at 02/25/22 0800   ceFEPime (MAXIPIME) IV Stopped (02/24/22 2334)   DAPTOmycin (CUBICIN) 550 mg in sodium chloride 0.9 % IVPB Stopped (02/24/22 2035)   feeding supplement (VITAL 1.5 CAL) 20 mL/hr at 02/25/22 0800   fentaNYL infusion INTRAVENOUS 200 mcg/hr (02/25/22 0800)   metronidazole Stopped (02/25/22 0105)   norepinephrine (LEVOPHED) Adult infusion 25 mcg/min (02/25/22 0800)   prismasol BGK 4/2.5 1,500 mL/hr at 02/25/22 0540   vasopressin 0.04  Units/min (02/25/22 0800)   sodium chloride, acetaminophen, fentaNYL, heparin, lip balm, mouth rinse, mouth rinse

## 2022-02-25 NOTE — Progress Notes (Signed)
PT was taken to MRI and back to 8D59 without complications.

## 2022-02-25 NOTE — Progress Notes (Signed)
Lower extremity venous duplex has been completed.   Preliminary results in CV Proc.   [name] Cally Nygard 02/25/2022 10:04 AM

## 2022-02-26 ENCOUNTER — Inpatient Hospital Stay (HOSPITAL_COMMUNITY): Payer: Medicare Other

## 2022-02-26 DIAGNOSIS — A419 Sepsis, unspecified organism: Secondary | ICD-10-CM | POA: Diagnosis not present

## 2022-02-26 DIAGNOSIS — N186 End stage renal disease: Secondary | ICD-10-CM | POA: Diagnosis not present

## 2022-02-26 DIAGNOSIS — I5043 Acute on chronic combined systolic (congestive) and diastolic (congestive) heart failure: Secondary | ICD-10-CM | POA: Diagnosis not present

## 2022-02-26 DIAGNOSIS — N3001 Acute cystitis with hematuria: Secondary | ICD-10-CM | POA: Diagnosis not present

## 2022-02-26 DIAGNOSIS — G9340 Encephalopathy, unspecified: Secondary | ICD-10-CM | POA: Diagnosis not present

## 2022-02-26 DIAGNOSIS — R6521 Severe sepsis with septic shock: Secondary | ICD-10-CM | POA: Diagnosis not present

## 2022-02-26 LAB — FOLATE: Folate: 8.7 ng/mL (ref >5.9)

## 2022-02-26 LAB — RENAL FUNCTION PANEL
Albumin: <1.5 g/dL — ABNORMAL LOW (ref 3.5–5.0)
Albumin: 2 g/dL — ABNORMAL LOW (ref 3.5–5.0)
Anion gap: 16 — ABNORMAL HIGH (ref 5–15)
Anion gap: 13 (ref 5–15)
BUN: 25 mg/dL — ABNORMAL HIGH (ref 6–20)
BUN: 21 mg/dL — ABNORMAL HIGH (ref 6–20)
CO2: 14 mmol/L — ABNORMAL LOW (ref 22–32)
CO2: 15 mmol/L — ABNORMAL LOW (ref 22–32)
Calcium: 8.3 mg/dL — ABNORMAL LOW (ref 8.9–10.3)
Calcium: 8.7 mg/dL — ABNORMAL LOW (ref 8.9–10.3)
Chloride: 104 mmol/L (ref 98–111)
Chloride: 106 mmol/L (ref 98–111)
Creatinine, Ser: 1.47 mg/dL — ABNORMAL HIGH (ref 0.44–1.00)
Creatinine, Ser: 1.26 mg/dL — ABNORMAL HIGH (ref 0.44–1.00)
GFR, Estimated: 41 mL/min — ABNORMAL LOW (ref >60)
GFR, Estimated: 50 mL/min — ABNORMAL LOW (ref >60)
Glucose, Bld: 234 mg/dL — ABNORMAL HIGH (ref 70–99)
Glucose, Bld: 211 mg/dL — ABNORMAL HIGH (ref 70–99)
Phosphorus: 3.4 mg/dL (ref 2.5–4.6)
Phosphorus: 3.6 mg/dL (ref 2.5–4.6)
Potassium: 4.8 mmol/L (ref 3.5–5.1)
Potassium: 4.9 mmol/L (ref 3.5–5.1)
Sodium: 134 mmol/L — ABNORMAL LOW (ref 135–145)
Sodium: 134 mmol/L — ABNORMAL LOW (ref 135–145)

## 2022-02-26 LAB — CORTISOL: Cortisol, Plasma: >100 ug/dL

## 2022-02-26 LAB — CBC
HCT: 27.8 % — ABNORMAL LOW (ref 36.0–46.0)
Hemoglobin: 8.9 g/dL — ABNORMAL LOW (ref 12.0–15.0)
MCH: 26.2 pg (ref 26.0–34.0)
MCHC: 32 g/dL (ref 30.0–36.0)
MCV: 81.8 fL (ref 80.0–100.0)
Platelets: 101 10*3/uL — ABNORMAL LOW (ref 150–400)
RBC: 3.4 MIL/uL — ABNORMAL LOW (ref 3.87–5.11)
RDW: 19.8 % — ABNORMAL HIGH (ref 11.5–15.5)
WBC: 34.1 10*3/uL — ABNORMAL HIGH (ref 4.0–10.5)
nRBC: 3.3 % — ABNORMAL HIGH (ref 0.0–0.2)

## 2022-02-26 LAB — POCT I-STAT 7, (LYTES, BLD GAS, ICA,H+H)
Acid-base deficit: 10 mmol/L — ABNORMAL HIGH (ref 0.0–2.0)
Bicarbonate: 16.7 mmol/L — ABNORMAL LOW (ref 20.0–28.0)
Calcium, Ion: 1.17 mmol/L (ref 1.15–1.40)
HCT: 28 % — ABNORMAL LOW (ref 36.0–46.0)
Hemoglobin: 9.5 g/dL — ABNORMAL LOW (ref 12.0–15.0)
O2 Saturation: 100 %
Patient temperature: 97.8
Potassium: 4.8 mmol/L (ref 3.5–5.1)
Sodium: 135 mmol/L (ref 135–145)
TCO2: 18 mmol/L — ABNORMAL LOW (ref 22–32)
pCO2 arterial: 35.8 mmHg (ref 32–48)
pH, Arterial: 7.273 — ABNORMAL LOW (ref 7.35–7.45)
pO2, Arterial: 418 mmHg — ABNORMAL HIGH (ref 83–108)

## 2022-02-26 LAB — MAGNESIUM: Magnesium: 2.4 mg/dL (ref 1.7–2.4)

## 2022-02-26 LAB — COOXEMETRY PANEL
Carboxyhemoglobin: 2.3 % — ABNORMAL HIGH (ref 0.5–1.5)
Methemoglobin: 1.1 % (ref 0.0–1.5)
O2 Saturation: 69.4 %
Total hemoglobin: 7.3 g/dL — ABNORMAL LOW (ref 12.0–16.0)

## 2022-02-26 LAB — GLUCOSE, CAPILLARY
Glucose-Capillary: 224 mg/dL — ABNORMAL HIGH (ref 70–99)
Glucose-Capillary: 181 mg/dL — ABNORMAL HIGH (ref 70–99)
Glucose-Capillary: 201 mg/dL — ABNORMAL HIGH (ref 70–99)
Glucose-Capillary: 192 mg/dL — ABNORMAL HIGH (ref 70–99)
Glucose-Capillary: 131 mg/dL — ABNORMAL HIGH (ref 70–99)
Glucose-Capillary: 216 mg/dL — ABNORMAL HIGH (ref 70–99)

## 2022-02-26 LAB — VITAMIN B12: Vitamin B-12: 3826 pg/mL — ABNORMAL HIGH (ref 180–914)

## 2022-02-26 LAB — VITAMIN C: Vitamin C: 0.2 mg/dL — ABNORMAL LOW (ref 0.4–2.0)

## 2022-02-26 LAB — VITAMIN A: Vitamin A (Retinoic Acid): 3.5 ug/dL — ABNORMAL LOW (ref 20.1–62.0)

## 2022-02-26 LAB — PATHOLOGIST SMEAR REVIEW

## 2022-02-26 LAB — APTT: aPTT: 97 seconds — ABNORMAL HIGH (ref 24–36)

## 2022-02-26 LAB — TRIGLYCERIDES: Triglycerides: 86 mg/dL (ref <150)

## 2022-02-26 LAB — LACTIC ACID, PLASMA: Lactic Acid, Venous: 8.6 mmol/L (ref 0.5–1.9)

## 2022-02-26 LAB — VDRL, CSF: VDRL Quant, CSF: NONREACTIVE

## 2022-02-26 MED ORDER — VITAL 1.5 CAL PO LIQD
1000.0000 mL | ORAL | Status: DC
  Administered 2022-02-26 – 2022-02-27 (×2): 1000 mL
  Filled 2022-02-26: qty 1000

## 2022-02-26 MED ORDER — MIDODRINE HCL 5 MG PO TABS
15.0000 mg | ORAL_TABLET | Freq: Three times a day (TID) | ORAL | Status: DC
  Administered 2022-02-26 – 2022-02-27 (×4): 15 mg
  Filled 2022-02-26 (×4): qty 3

## 2022-02-26 MED ORDER — ALBUMIN HUMAN 25 % IV SOLN
25.0000 g | Freq: Four times a day (QID) | INTRAVENOUS | Status: AC
  Administered 2022-02-26 (×2): 25 g via INTRAVENOUS
  Administered 2022-02-26: 12.5 g via INTRAVENOUS
  Administered 2022-02-27: 25 g via INTRAVENOUS
  Filled 2022-02-26 (×4): qty 100

## 2022-02-26 MED ORDER — SODIUM CHLORIDE 0.9 % IV SOLN: 2.0000 g | INTRAVENOUS | Status: DC

## 2022-02-26 MED ORDER — THIAMINE HCL 100 MG/ML IJ SOLN: 250.0000 mg | Freq: Every day | INTRAVENOUS | Status: DC

## 2022-02-26 MED ORDER — DEXMEDETOMIDINE HCL IN NACL 400 MCG/100ML IV SOLN: 0.0000 ug/kg/h | INTRAVENOUS | Status: DC

## 2022-02-26 MED ORDER — PROSOURCE TF20 ENFIT COMPATIBL EN LIQD
60.0000 mL | Freq: Two times a day (BID) | ENTERAL | Status: DC
  Administered 2022-02-26 – 2022-02-27 (×3): 60 mL
  Filled 2022-02-26 (×3): qty 60

## 2022-02-26 MED ORDER — ASPIRIN 81 MG PO CHEW
81.0000 mg | CHEWABLE_TABLET | Freq: Every day | ORAL | Status: DC
  Administered 2022-02-26 – 2022-02-27 (×2): 81 mg
  Filled 2022-02-26 (×2): qty 1

## 2022-02-26 MED ORDER — LACTATED RINGERS IV BOLUS
1000.0000 mL | Freq: Once | INTRAVENOUS | Status: AC
  Administered 2022-02-26: 1000 mL via INTRAVENOUS

## 2022-02-26 MED ORDER — SODIUM CHLORIDE 0.9 % IV SOLN
1.0000 g | Freq: Three times a day (TID) | INTRAVENOUS | Status: DC
  Administered 2022-02-26 – 2022-02-27 (×2): 1 g via INTRAVENOUS
  Filled 2022-02-26 (×2): qty 20

## 2022-02-26 MED ORDER — FENTANYL CITRATE PF 50 MCG/ML IJ SOSY: 50.0000 ug | PREFILLED_SYRINGE | INTRAMUSCULAR | Status: DC | PRN

## 2022-02-26 MED ORDER — THIAMINE HCL 100 MG/ML IJ SOLN
500.0000 mg | Freq: Three times a day (TID) | INTRAVENOUS | Status: DC
  Administered 2022-02-26 – 2022-02-27 (×5): 500 mg via INTRAVENOUS
  Filled 2022-02-26 (×6): qty 5

## 2022-02-26 MED ORDER — LINEZOLID 600 MG/300ML IV SOLN
600.0000 mg | Freq: Two times a day (BID) | INTRAVENOUS | Status: DC
  Administered 2022-02-26 – 2022-02-27 (×2): 600 mg via INTRAVENOUS
  Filled 2022-02-26 (×3): qty 300

## 2022-02-26 MED ORDER — THIAMINE HCL 100 MG/ML IJ SOLN: 100.0000 mg | Freq: Every day | INTRAMUSCULAR | Status: DC

## 2022-02-26 MED ORDER — FENTANYL CITRATE PF 50 MCG/ML IJ SOSY
50.0000 ug | PREFILLED_SYRINGE | INTRAMUSCULAR | Status: DC | PRN
  Administered 2022-02-26: 50 ug via INTRAVENOUS

## 2022-02-26 MED ORDER — ALBUMIN HUMAN 25 % IV SOLN
25.0000 g | Freq: Once | INTRAVENOUS | Status: AC
  Administered 2022-02-26: 25 g via INTRAVENOUS
  Filled 2022-02-26: qty 100

## 2022-02-26 MED ORDER — DROXIDOPA 100 MG PO CAPS
100.0000 mg | ORAL_CAPSULE | Freq: Three times a day (TID) | ORAL | Status: DC
  Administered 2022-02-26 – 2022-02-27 (×5): 100 mg
  Filled 2022-02-26 (×8): qty 1

## 2022-02-26 NOTE — Progress Notes (Signed)
Brief Nutrition Support Note  Discussed pt with RN and during ICU rounds. Also discussed pt with PCCM MD. Pt with Vital 1.5 tube feeds infusing at trickle rate via Cortrak since 02/23/22 due to pressor requirements. MD okay with advancing tube feeding regimen to goal. Discussed advancement plan with RN.  RD to adjust tube feeding orders: - Increase rate of Vital 1.5 to 30 ml/hr and continue to advance rate by 10 ml q 6 hours to goal rate of 55 ml/hr (1320 ml/day) - Add PROSource TF20 60 ml BID  Tube feeding regimen at goal rate provides 2140 kcal, 129 grams of protein, and 1008 ml of H2O.   - Continue renal MVI daily per tube  - Once pt able to take POs, recommend vitamin A 10,000 IU daily x 14 days given vitamin A deficiency  RD will continue to follow pt during admission.   Gustavus Bryant, MS, RD, LDN Inpatient Clinical Dietitian Please see AMiON for contact information.

## 2022-02-26 NOTE — Progress Notes (Signed)
[facility] for Infectious Disease  Date of Admission:  02/28/2022      Total days of antibiotics 6  Linezolid 12/06 >> 12/07  Cefepime 12/01 >> 12/07  Daptomycin            ASSESSMENT: [name] is a 56 y.o. female  No improvement on empic broad spectrum antibiotics. PCT difficult to interpret meaningfully in terms of ESRD patients.  Unclear as to why she has leukocytosis / fever and progressive encephalopathy - eeg shows moderate/severe study but no specific reversible etiology identified. C/A/P CT unrevealing. MRI of brain with concern over possible meningitis but does not correlate with LP (WBC 2). Would limit lines/etc when able.   On exam she seems a little more awake today but not purposefully following commands for [place]. Her wounds appear uninfected and shallow. Sedation now off. She stares out the window and will slowly track to voice for [place] but doesn't hold gaze. Not clear that the cefepime would be contributing to encephalopathy, would not explain leukocytosis. No indication to continue this - will D/C along with other antibiotics.   [place] discussion with family pending given MODS and persistent shock.    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   Acute metabolic encephalopathy   ESRD (end stage renal disease) on dialysis (HCC)   Chronic respiratory failure with hypoxia (HCC)   UTI (urinary tract infection)   Lactic acid acidosis   Malnutrition of moderate degree    artificial tears   Both Eyes Q8H   aspirin  81 mg Per Tube Daily   Chlorhexidine  Gluconate Cloth  6 each Topical Daily   darbepoetin (ARANESP) injection - DIALYSIS  100 mcg Subcutaneous Q Mon-1800   docusate  200 mg Per Tube BID   droxidopa  100 mg Per Tube Q8H   famotidine  10 mg Per Tube Daily   feeding supplement (PROSource TF20)  60 mL Per Tube BID   insulin aspart  0-15 Units Subcutaneous Q4H   midodrine  15 mg Per Tube Q8H   multivitamin  1 tablet Per Tube QHS   mouth rinse  15 mL Mouth Rinse Q2H   polyethylene glycol  17 g Per Tube BID   sennosides  10 mL Per Tube BID   [START ON 03/06/2022] thiamine (VITAMIN B1) injection  100 mg Intravenous Daily    SUBJECTIVE: Intubated, no changes.    Review of Systems: ROS - unable to obtain    Allergies  Allergen Reactions   Bactrim [Sulfamethoxazole-Trimethoprim] Other (See Comments)    Skin peeled off   Diphenhydramine-Acetaminophen Hives   Benadryl [Diphenhydramine] Hives   Cephalosporins Anxiety   Ms Contin [Morphine] Other (See Comments)    Severe somnolence   Tylenol Pm Extra [Diphenhydramine-Apap (Sleep)] Hives   Valium [Diazepam] Hives   Zocor [Simvastatin] Other (See Comments)    Arthralgia    Vancomycin Other (See Comments)    Skin peeling due to one of the following: Vancomycin, flagyl, or rocephin. Vanc seems to be the most likely culprit. ?  SJS, interestingly h/o "skin peeled off" to bactrim as well (sounds really suspicious for SJS to me).   Ceftriaxone Rash   Cipro [Ciprofloxacin Hcl] Anxiety   Latex Itching   Levaquin [Levofloxacin] Anxiety    OBJECTIVE: Vitals:   02/26/22 1000 02/26/22 1059 02/26/22 1100 02/26/22 1133  BP: (!) 100/52 (!) 111/48 (!) 95/45   Pulse: (!) 108 (!) 102 (!) 103   Resp: 11 (!) 22 (!) 0   Temp:    97.8 F (36.6 C)  TempSrc:    Axillary  SpO2: 100% 100% 100%   Weight:       Body mass index is 44.4 kg/m.  Physical Exam Constitutional:      Appearance: Normal appearance. She is not ill-appearing.  HENT:     Mouth/Throat:     Mouth: Mucous  membranes are moist.     Pharynx: Oropharynx is clear.  Eyes:     General: No scleral icterus. Cardiovascular:     Rate and Rhythm: Tachycardia present.  Pulmonary:     Effort: Pulmonary effort is normal.  Neurological:     Mental Status: She is disoriented.     Lab Results Lab Results  Component Value Date   WBC 34.1 (H) 02/26/2022   HGB 8.9 (L) 02/26/2022   HCT 27.8 (L) 02/26/2022   MCV 81.8 02/26/2022   PLT 101 (L) 02/26/2022    Lab Results  Component Value Date   CREATININE 1.47 (H) 02/26/2022   BUN 25 (H) 02/26/2022   NA 134 (L) 02/26/2022   K 4.8 02/26/2022   CL 104 02/26/2022   CO2 14 (L) 02/26/2022    Lab Results  Component Value Date   ALT 51 (H) 02/25/2022   AST 111 (H) 02/25/2022   ALKPHOS 111 02/25/2022   BILITOT 6.2 (H) 02/25/2022     Microbiology: Recent Results (from the past 240 hour(s))  Urine Culture     Status: Abnormal   Collection Time: 03/02/2022 11:10 AM   Specimen: In/Out Cath Urine  Result Value Ref Range Status   Specimen Description IN/OUT CATH URINE  Final   Special Requests   Final    NONE Performed at [facility], [place] [address] [place], [place] [address]    Culture (A)  Final    20,000 COLONIES/mL ENTEROBACTER AEROGENES 50,000 COLONIES/mL YEAST    Report Status 02/23/2022 FINAL  Final   Organism ID, Bacteria ENTEROBACTER AEROGENES (A)  Final      Susceptibility   Enterobacter aerogenes - MIC*    CEFAZOLIN >=64 RESISTANT Resistant     CEFEPIME 0.25 SENSITIVE Sensitive     CEFTRIAXONE 8 RESISTANT Resistant     CIPROFLOXACIN <=0.25 SENSITIVE Sensitive     GENTAMICIN <=1 SENSITIVE Sensitive     IMIPENEM 0.5 SENSITIVE Sensitive     NITROFURANTOIN 64 INTERMEDIATE Intermediate     TRIMETH/SULFA <=20 SENSITIVE Sensitive     PIP/TAZO >=128 RESISTANT Resistant     * 20,000 COLONIES/mL ENTEROBACTER AEROGENES  Blood culture (routine x 2)     Status: None   Collection Time: 03/13/2022  4:09 PM   Specimen: BLOOD LEFT ARM   Result Value Ref Range Status   Specimen Description BLOOD LEFT ARM  Final   Special Requests   Final    BOTTLES DRAWN AEROBIC AND ANAEROBIC Blood Culture results may not be optimal due to an inadequate volume of blood received in culture bottles   Culture   Final    NO GROWTH 5 DAYS  Performed at [facility], [place] [address] [place], [place] [address]    Report Status 02/25/2022 FINAL  Final  MRSA Next Gen by PCR, Nasal     Status: None   Collection Time: 03/10/2022  5:27 PM   Specimen: Nasal Mucosa; Nasal Swab  Result Value Ref Range Status   MRSA by PCR Next Gen NOT DETECTED NOT DETECTED Final    Comment: (NOTE) The GeneXpert MRSA Assay (FDA approved for NASAL specimens only), is one component of a comprehensive MRSA colonization surveillance program. It is not intended to diagnose MRSA infection nor to guide or monitor treatment for MRSA infections. Test performance is not FDA approved in patients less than 62 years old. Performed at [facility], [place] [address] [place], [place] [address]   Blood culture (routine x 2)     Status: None   Collection Time: 03/10/2022  8:23 PM   Specimen: BLOOD LEFT HAND  Result Value Ref Range Status   Specimen Description BLOOD LEFT HAND  Final   Special Requests   Final    BOTTLES DRAWN AEROBIC AND ANAEROBIC Blood Culture results may not be optimal due to an inadequate volume of blood received in culture bottles   Culture   Final    NO GROWTH 5 DAYS Performed at [facility], [place] [address], [place], [place] [address]    Report Status 02/25/2022 FINAL  Final  CSF culture w Gram Stain     Status: None (Preliminary result)   Collection Time: 02/25/22  3:14 PM   Specimen: CSF; Cerebrospinal Fluid  Result Value Ref Range Status   Specimen Description CSF  Final   Special Requests NONE  Final   Gram Stain   Final    WBC PRESENT, PREDOMINANTLY MONONUCLEAR NO ORGANISMS SEEN CYTOSPIN SMEAR    Culture   Final    NO GROWTH <  24 HOURS Performed at [facility], [place] [address] [place], [place] [address]    Report Status PENDING  Incomplete  Culture, fungus without smear     Status: None (Preliminary result)   Collection Time: 02/25/22  3:14 PM   Specimen: CSF; Cerebrospinal Fluid  Result Value Ref Range Status   Specimen Description CSF  Final   Special Requests NONE  Final   Culture   Final    NO FUNGUS ISOLATED AFTER 1 DAY Performed at [facility], [place] [address], [place],  [address]    Report Status PENDING  Incomplete    Janene Madeira, MSN, NP-C [facility] for Infectious Disease [email or web address] Pager: [phone] Office: [phone] [facility] Main Line: [place] Communication Welcome

## 2022-02-26 NOTE — Progress Notes (Signed)
eLink Physician-Brief Progress Note Patient Name: [name] DOB: 01/24/1966 MRN: [id number]   Date of Service  02/26/2022  HPI/Events of Note  Hypotension - Requiring Norepinephrine IV infusion at 25 mcg/min + Vasopressin at 0.04 units/hour. Albumin < 1.5.  eICU Interventions  Plan: 1.25% Albumin 25 gm IV now.     Intervention Category Major Interventions: Hypotension - evaluation and management  Tyshana Nishida Eugene 02/26/2022, 4:41 AM

## 2022-02-26 NOTE — Progress Notes (Signed)
Coox 70% which matches with mixed physiology shock.  No role for inotropes at present.  Erskine Emery MD PCCM

## 2022-02-26 NOTE — Progress Notes (Signed)
Pharmacy ICU Insulin Management  CBGs above goal 140-180 mg/dL: Yes  Current regimen (include units of SSI in last 24hr): sSSI (27u/24)  Medications affecting CBG levels: -TF restarted (Vital 1.5 @ 20/hr- trickle d/t pressor requirements) -hydrocortisone 100 q8h  - now discontinued  Plan:. -continue current regimen for now- avoiding addition of TF coverage and long acting agents d/t tenuous pressor requirement and adjustment of medication regimen affecting CBGs (d/c steroids)  Wilson Singer, PharmD Clinical Pharmacist 02/26/2022 8:07 AM

## 2022-02-26 NOTE — Progress Notes (Signed)
Neurology Progress Note  Brief HPI: 56 y.o. 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 Patient was brought to [facility] from [facility] where she has been altered for 3 days prior to admission. At baseline, she is A/Ox4 and can ambulate with assistance. She was recently diagnosed with a UTI (11/28) and was given Cirpo as well as a gram of Rocephin.  She was then started on cefepime 2 g IV every 12 and daptomycin 6 mg/kg IV every 24 hours.  CRRT was then initiated on 12/2 and she was intubated on 12/5.  Prior to intubation she was oriented to self and did follow commands with prompting.  LP was completed on 12/6.     Subjective: Patient seen in room.  Fentanyl off, on CRRT  Exam: Vitals:   02/26/22 0800 02/26/22 0811  BP: (!) 88/39   Pulse: (!) 108 (!) 111  Resp: (!) 0 (!) 9  Temp:  (!) 97.5 F (36.4 C)  SpO2: 100% 97%   GENERAL: Intubated, unresponsive HEENT: - Normocephalic and atraumatic, dry mm, no lymphadenopathy, no Thyromegally LUNGS - Clear to auscultation bilaterally, mechanically ventilated CV - S1S2 RRR, equal pulses bilaterally. ABDOMEN - Soft, nontender, nondistended with normoactive BS Ext: warm, well perfused, intact peripheral pulses, no pedal edema   NEURO:  Mental Status: unresponsive, eyes open Cranial Nerves:  II: Pupils 3 mm and sluggish III, IV, VI: Corneals intact, does not blink to threat.  Eyes midline with slow roving eye movements. IX, X: Gag intact, no cough XI: Head is midline Motor/sensation:  No purposeful movement noted in extremities, no withdrawal to pain in extremities.      Pertinent Labs: Vit B12 3862 TSH 3.546 Cortisol >100.0  Imaging Reviewed: CT-head No acute intracranial process.   CT CAP- 1. Small bilateral pleural effusions with bilateral lower lobe atelectasis. 2. Diffuse bladder wall thickening with surrounding inflammatory stranding, correlate  for cystitis. 3. Colonic diverticulosis with mild stranding adjacent to the proximal sigmoid colon, indeterminate for mild diverticulitis. 4. Anasarca with small volume ascites. 5. Distended gallbladder containing high density fluid and suspected stones. Consider right upper quadrant ultrasound if there is clinical concern for acute cholecystitis. 6.  Aortic Atherosclerosis (ICD10-I70.0).   MRI brain- Incomplete suppression of sulcal fluid signal on the FLAIR sequences along the bilateral occipital and temporal convexities, which may be artifactual, but can be seen in the setting of meningitis. If there is clinical concern for meningitis, correlation with CSF sampling is recommended.  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 She is currently admitted to the ICU with fever of unknown origin and encephalopathy with shock, multiorgan dysfunction. She had MRI Brain which demonstrated sulcal FLAIR hyperintensities concerning for meningitis vs artifact. LP with no pleocytosis, no elevated protein, meningitis/encephalitis panel negative. LP is not particularly concerning for meningitis.    Overall etiology of her encephalopathy is not entirely clear. I do have suspicion that Cefepime could be contributing and causing potential neurotoxicity. She is specially high risk with her hx of ESRD and now on CRRT. Recommend alternative to Cefepime if possible. I dont think that cefepime induced neurotoxicity would  explain the elevated WBC count, lactate and would certainly not explain the entirety of the picture. Maybe the chronic wounds on her extremities could be causing the noted septic  shock. Other workup with TSH not concerning for myxedema coma, Ammonia levels normal, cortisol levels pending,    Recommendations: - Discontinue cefepime -   Patient seen and examined by NP/APP with MD. MD to update note as needed.   Janine Ores, DNP, FNP-BC [facility] Pager: [phone]

## 2022-02-26 NOTE — Progress Notes (Addendum)
Advanced Heart Failure Rounding Note  HF Cardiologist: Dr. Aundra Dubin  Subjective:    CO-OX 69% on NE 15 and Vaso 0.04.  Midodrine up to 15 TID and droxidopa added to facilitate pressor wean  CVP 3. Running even on CRRT.  Remains intubated. Intermittently following some commands off sedation.   Objective:   Weight Range: 128.6 kg Body mass index is 44.4 kg/m.   Vital Signs:   Temp:  [97.5 F (36.4 C)-100.5 F (38.1 C)] 97.5 F (36.4 C) (12/07 0811) Pulse Rate:  [95-111] 111 (12/07 0811) Resp:  [0-31] 24 (12/07 0840) BP: (60-156)/(26-121) 133/55 (12/07 0840) SpO2:  [95 %-100 %] 97 % (12/07 0811) Arterial Line BP: (98-144)/(38-58) 144/58 (12/07 0800) FiO2 (%):  [50 %] 50 % (12/07 0840) Last BM Date : 2022-03-28  Weight change: Filed Weights   02/23/22 0500 02/24/22 0219 02/25/22 0500  Weight: 130.4 kg 127.7 kg 128.6 kg    Intake/Output:   Intake/Output Summary (Last 24 hours) at 02/26/2022 0936 Last data filed at 02/26/2022 0900 Gross per 24 hour  Intake 3053.42 ml  Output 2674.7 ml  Net 378.72 ml      Physical Exam    General:  Critically ill appearing on vent HEENT: + ETT Neck: Supple. JVP not elevated. Carotids 2+ bilat; no bruits.  Cor: PMI nondisplaced. Regular rate & rhythm. No rubs, gallops or murmurs. Lungs: Mechanical breath sounds Abdomen: Morbidly obese. Multiple dressings on abdominal wounds. Extremities: No cyanosis, clubbing, rash, chronic venous stasis changes/lymphedema b/l LE, dressings covering multiple wounds Neuro: Not following commands   Telemetry   SR/ST 90s-100s  Labs    CBC Recent Labs    02/25/22 0335 02/26/22 0322  WBC 23.1* 34.1*  HGB 8.5* 8.9*  HCT 24.9* 27.8*  MCV 79.0* 81.8  PLT 114* 024*   Basic Metabolic Panel Recent Labs    02/25/22 0335 02/25/22 1257 02/26/22 0322  NA 133* 133* 134*  K 4.3 4.6 4.8  CL 102 103 104  CO2 19* 15* 14*  GLUCOSE 225* 235* 234*  BUN 21* 30* 25*  CREATININE 1.42* 1.74*  1.47*  CALCIUM 8.1* 8.4* 8.3*  MG 2.4  --  2.4  PHOS 2.7 3.7 3.4   Liver Function Tests Recent Labs    02/25/22 0926 02/25/22 1257 02/26/22 0322  AST 111*  --   --   ALT 51*  --   --   ALKPHOS 111  --   --   BILITOT 6.2*  --   --   PROT 5.5*  --   --   ALBUMIN <1.5* <1.5* <1.5*   No results for input(s): "LIPASE", "AMYLASE" in the last 72 hours. Cardiac Enzymes Recent Labs    02/24/22 0136  CKTOTAL 430*    BNP: BNP (last 3 results) Recent Labs    01/27/22 0435 01/28/22 0639 03/14/2022 1609  BNP >4,500.0* 3,263.3* >4,500.0*    ProBNP (last 3 results) No results for input(s): "PROBNP" in the last 8760 hours.   D-Dimer Recent Labs    02/24/22 0136  DDIMER 4.92*   Hemoglobin A1C No results for input(s): "HGBA1C" in the last 72 hours. Fasting Lipid Panel Recent Labs    02/26/22 0322  TRIG 86   Thyroid Function Tests Recent Labs    02/25/22 0926  TSH 3.546    Other results:   Imaging    VAS [place] LOWER EXTREMITY VENOUS (DVT)  Result Date: 02/25/2022  Lower Venous DVT Study Patient Name:  [facility]  Date  of Exam:   02/25/2022 Medical Rec #: [id number]       Accession #:    [id number] Date of Birth: [birth year]        Patient Gender: F Patient Age:   56 years Exam Location:  [facility] Procedure:      VAS [place] LOWER EXTREMITY VENOUS (DVT) Referring Phys: Darlina Sicilian --------------------------------------------------------------------------------  Indications: Edema.  Limitations: Body habitus, poor ultrasound/tissue interface and bandages. Comparison Study: 10/05/18 Performing Technologist: Archie Patten RVS  Examination Guidelines: A complete evaluation includes B-mode imaging, spectral Doppler, color Doppler, and power Doppler as needed of all accessible portions of each vessel. Bilateral testing is considered an integral part of a complete examination. Limited examinations for reoccurring indications may be performed as noted. The reflux  portion of the exam is performed with the patient in reverse Trendelenburg.  +--------+---------------+---------+-----------+----------+--------------------+ RIGHT   CompressibilityPhasicitySpontaneityPropertiesThrombus Aging       +--------+---------------+---------+-----------+----------+--------------------+ CFV     Full           Yes      Yes                                       +--------+---------------+---------+-----------+----------+--------------------+ SFJ     Full                                                              +--------+---------------+---------+-----------+----------+--------------------+ FV Prox Full                                                              +--------+---------------+---------+-----------+----------+--------------------+ FV Mid                 Yes      Yes                  patent by color                                                           doppler              +--------+---------------+---------+-----------+----------+--------------------+ FV                     Yes      Yes                  patent by color      Distal                                               doppler              +--------+---------------+---------+-----------+----------+--------------------+ PFV     Full                                                              +--------+---------------+---------+-----------+----------+--------------------+  POP     Full           Yes      Yes                                       +--------+---------------+---------+-----------+----------+--------------------+ PTV                    Yes      Yes                  patent by color                                                           doppler              +--------+---------------+---------+-----------+----------+--------------------+ PERO                                                 Not well visualized   +--------+---------------+---------+-----------+----------+--------------------+   +--------+---------------+---------+-----------+----------+--------------------+ LEFT    CompressibilityPhasicitySpontaneityPropertiesThrombus Aging       +--------+---------------+---------+-----------+----------+--------------------+ CFV     Full           Yes      Yes                                       +--------+---------------+---------+-----------+----------+--------------------+ SFJ     Full                                                              +--------+---------------+---------+-----------+----------+--------------------+ FV Prox Full                                                              +--------+---------------+---------+-----------+----------+--------------------+ FV Mid                 Yes      Yes                  patent by color                                                           doppler              +--------+---------------+---------+-----------+----------+--------------------+ FV                     Yes      Yes  patent by color      Distal                                               doppler              +--------+---------------+---------+-----------+----------+--------------------+ PFV     Full                                                              +--------+---------------+---------+-----------+----------+--------------------+ POP     Full           Yes                                                +--------+---------------+---------+-----------+----------+--------------------+ PTV                                                  Not well visualized  +--------+---------------+---------+-----------+----------+--------------------+ PERO                                                 Not well visualized  +--------+---------------+---------+-----------+----------+--------------------+      Summary: RIGHT: - There is no evidence of deep vein thrombosis in the lower extremity. However, portions of this examination were limited- see technologist comments above.  - No cystic structure found in the popliteal fossa.  LEFT: - There is no evidence of deep vein thrombosis in the lower extremity. However, portions of this examination were limited- see technologist comments above.  - No cystic structure found in the popliteal fossa.  *See table(s) above for measurements and observations. Electronically signed by Harold Barban MD on 02/25/2022 at 8:53:06 PM.    Final    MR BRAIN WO CONTRAST  Result Date: 02/25/2022 CLINICAL DATA:  Altered mental status EXAM: MRI HEAD WITHOUT CONTRAST TECHNIQUE: Multiplanar, multiecho pulse sequences of the brain and surrounding structures were obtained without intravenous contrast. COMPARISON:  CT Head 02/24/2022 FINDINGS: Brain: No acute infarction, hemorrhage, hydrocephalus, extra-axial collection or mass lesion. There is sequela of very mild chronic microvascular ischemic change. There is incomplete suppression of sulcal fluid signal on the FLAIR sequences along the bilateral occipital and temporal convexities (series 4, image 14). Some of these regions may also demonstrate diffusion restriction (series 2, image 27). Vascular: Normal flow voids. Skull and upper cervical spine: Normal marrow signal. Sinuses/Orbits: Small bilateral mastoid effusions. Paranasal sinuses are otherwise clear. Orbits are normal in appearance. There is an enteric tube in place. Other: None IMPRESSION: 1. No acute intracranial process. 2. Incomplete suppression of sulcal fluid signal on the FLAIR sequences along the bilateral occipital and temporal convexities, which may be artifactual, but can be seen in the setting of meningitis. If there is clinical concern for meningitis, correlation with CSF sampling is recommended. Electronically  Signed   By: Marin Roberts M.D.   On: 02/25/2022 12:49      Medications:     Scheduled Medications:  artificial tears   Both Eyes Q8H   Chlorhexidine Gluconate Cloth  6 each Topical Daily   darbepoetin (ARANESP) injection - DIALYSIS  100 mcg Subcutaneous Q Mon-1800   docusate  200 mg Per Tube BID   droxidopa  100 mg Per Tube Q8H   famotidine  10 mg Per Tube Daily   insulin aspart  0-15 Units Subcutaneous Q4H   midodrine  15 mg Per Tube Q8H   multivitamin  1 tablet Per Tube QHS   mouth rinse  15 mL Mouth Rinse Q2H   polyethylene glycol  17 g Per Tube BID   sennosides  10 mL Per Tube BID   [START ON 03/06/2022] thiamine (VITAMIN B1) injection  100 mg Intravenous Daily    Infusions:   prismasol BGK 4/2.5 400 mL/hr at 02/26/22 0059    prismasol BGK 4/2.5 400 mL/hr at 02/26/22 0059   sodium chloride     sodium chloride 10 mL/hr at 02/26/22 0900   albumin human     feeding supplement (VITAL 1.5 CAL) 20 mL/hr at 02/26/22 0900   heparin 10,000 units/ 20 mL infusion syringe 750 Units/hr (02/26/22 0900)   norepinephrine (LEVOPHED) Adult infusion 15 mcg/min (02/26/22 0901)   prismasol BGK 4/2.5 1,500 mL/hr at 02/26/22 0901   thiamine (VITAMIN B1) injection Stopped (02/26/22 4650)   Followed by   Derrill Memo ON 02/28/2022] thiamine (VITAMIN B1) injection     vasopressin 0.04 Units/min (02/26/22 0901)    PRN Medications: sodium chloride, acetaminophen, heparin, lip balm   Assessment/Plan   Shock, septic with likely cardiogenic component -Initially thought d/t UTI. UC + for enterobacter. Blood cultures negative but had already received abx. LP not consistent with meningitis. -No clear source on imaging -Treated with course of abx - abx stopped this am -ID has seen -Lactic acid had been clearing, then trended back up to 7.6 on 12/06 -Echo with newly reduced EF 25-30%. Known extensive CAD and elevated HS troponin peaked at 1,984. New Q waves in inferior and lateral leads. Suspect she occluded her vein grafts within the last few days -CO-OX  stable 69% -Currently on NE 15 + vaso 0.04. CCM increasing midodrine and added droxidopa to facilitate wean off pressors -CVP 3. On CRRT running even. -Not currently a candidate for any invasive cardiac workup.  -Given her comorbidities, poor baseline functional status and MSOF, would consider GOC discussion. Family meeting this afternoon for [place] discussion   2. Extensive CAD with elevated troponin -S/p CABG in 2011  -Recurrent in-stent restenosis SVG to LAD treated with multiple PCIs -Most recent cath 08/23: occluded native RCA and SVG to RCA, patent SVG to LAD, patent SVG to D, diffuse disease Lcx. No targets for revascularization. -Hs troponin 1984, trend. New Q waves inferior and lateral leads. As above suspect she occluded her grafts within last few days -Not currently a candidate for invasive workup   3. Acute respiratory failure with hypoxia -Intubated 12/05 -Vent management per CCM   4. Altered mental status -Possibly secondary to sepsis but growing concern for CNS issue -CT head unrevealing -MRI brain incomplete suppression of sulcal fluid signal on FLAIR sequences along b/l occipital and temporal convexities which could be artifactual or seen with meningitis -LP >> no meningitis -Neuro consulted. ? Cefepime causing some component of neurotoxicity. Now off. -EEG today   5. ESRD on  HD -HD M, W, F -On chronic midodrine -CRRT started 12/02 -Nephrology following   6. Bilateral LE wounds -Seen by VVS -Felt to be d/t mixed venous and aterial disease -Not currently a candidate for any lower extremity angiography  GOC: -Palliative planning for family meeting today -Worry that her overall prognosis is poor  Length of Stay: Stanley, LINDSAY N, PA-C  02/26/2022, 9:36 AM  Advanced Heart Failure Team Pager [phone] (M-F; 7a - 5p)  Please contact [facility] for night-coverage after hours (5p -7a ) and weekends on [email or web address]   Patient seen with PA, agree with the above  note.   She remains on vent.  Eyes are open but does not follow commands.    She is on NE 18, vasopressin 0.04.  Co-ox 69%.  CVP 3.   General: Eyes open, on vent Neck: No JVD, no thyromegaly or thyroid nodule.  Lungs: Clear to auscultation bilaterally with normal respiratory effort. CV: Nondisplaced PMI.  Heart regular S1/S2, no S3/S4, no murmur.  2+ chronic edema to knees.   Abdomen: Soft, nontender, no hepatosplenomegaly, no distention.  Skin: Intact without lesions or rashes.  Neurologic: Not following commands.  Extremities: venous stasis ulcerations lower legs.  HEENT: Normal.   Suspect mixed cardiogenic/septic shock.  MAP marginal on NE 18 + vasopressin 0.04, co-ox adequate at 69%.  She is on CVVH with CVP 3.  Echo with EF 25-30% with apical and inferior akinesis, newly decreased from prior echo. HS-TnI 1943 => 1473.  Suspect at some point relatively recently she occluded SVG-LAD (+/- SVT-D). She is encephalopathic and not following commands.  - Continue to run CVVH even.  - Wean pressors as able.   - She does not need an inotrope at this time.  - She is not a candidate for advance therapies.  I do not think she would be a candidate for acute angiography/revascularization as suspected occlusion of SVG took place > 48 hrs ago.   She has been treated for Enterobacter in urine.  ID has seen, not recommending further abx.   She remains encephalopathic but eyes are open.  LP was negative.   Suspect poor prognosis.   CRITICAL CARE Performed by: Loralie Champagne  Total critical care time: 35 minutes  Critical care time was exclusive of separately billable procedures and treating other patients.  Critical care was necessary to treat or prevent imminent or life-threatening deterioration.  Critical care was time spent personally by me on the following activities: development of treatment plan with patient and/or surrogate as well as nursing, discussions with consultants, evaluation of  patient's response to treatment, examination of patient, obtaining history from patient or surrogate, ordering and performing treatments and interventions, ordering and review of laboratory studies, ordering and review of radiographic studies, pulse oximetry and re-evaluation of patient's condition.  Loralie Champagne 02/26/2022 5:01 PM

## 2022-02-26 NOTE — Progress Notes (Signed)
NAME:  [name], MRN:  [id number], DOB:  Jul 15, 1965, LOS: 6 ADMISSION DATE:  03/20/2022, CONSULTATION DATE:  03/02/2022 REFERRING MD:  [name], CHIEF COMPLAINT:  AMS   History of Present Illness:  [name] is a 56y.o. female with a significant PMH for DM2, HTN, HLD, Morbid Obesity, OSA, ESRD, CAD/CABG, Chronic Diastolic CHF. Patient was brought to [facility] from [facility] where she had been admitted for three days. At baseline, she is A/Ox4 and can ambulate with assistance. She was recently diagnosed with a UTI (11/28) and was given Cipro as well as a gram of Rocephin. She is a on iHD MWF, and missed her last two sessions, except for (12/1).   Pertinent labs in the ER showed WBC 21.9, Cr 5.00, AST 141 ALT 51, LA 6.1. UA positive for leukocytes, proteins, and many bacteria. CT Head showed NAICA. Chest Xray showed CHF versus volume overload. Patient did receive Linezolid prior to Blood Cultures being collected.  BC pending.   PCCM consulted for AMS in the setting of septic shock.   Pertinent  Medical History  Unable to obtain d/t AMS.   Significant Hospital Events: Including procedures, antibiotic start and stop dates in addition to other pertinent events   12/1 Admitted  12/1 CVC and arterial catheter placed 12/2 Started Precedex Norepinephrine 11-15 12/4 Worsening Mental Status and pressor requirements   Interim History / Subjective:  I think she's a little more awake today. Remains on pressors, vent, sedation.  Objective   Blood pressure (!) 82/49, pulse 100, temperature 98.3 F (36.8 C), temperature source Axillary, resp. rate (!) 0, weight 128.6 kg, SpO2 100 %. CVP:  [6 mmHg] 6 mmHg  Vent Mode: PRVC FiO2 (%):  [40 %-50 %] 50 % Set Rate:  [30 bmp] 30 bmp Vt Set:  [490 mL] 490 mL PEEP:  [5 cmH20] 5 cmH20 Plateau Pressure:  [16 cmH20-22 cmH20] 22 cmH20   Intake/Output Summary (Last 24 hours) at 02/26/2022 0752 Last data filed at 02/26/2022 0700 Gross per  24 hour  Intake 3080.96 ml  Output 2415.1 ml  Net 665.86 ml    Filed Weights   02/23/22 0500 02/24/22 0219 02/25/22 0500  Weight: 130.4 kg 127.7 kg 128.6 kg   Physical Examination: Ill appearing woman on vent Triggers vent Opens eyes to voice Maybe follows commands?  Will dc sedation and recheck Some rhonci on lung exam Abd soft Stable LE wound on R Lichenification of skin throughout Patient Lines/Drains/Airways Status     Active Line/Drains/Airways     Name Placement date Placement time Site Days   Arterial Line 03/14/2022 Left Brachial 02/25/2022  1800  Brachial  6   Peripheral IV 03/15/2022 22 G 1.75" Anterior;Left Forearm 03/13/2022  1617  Forearm  6   CVC Triple Lumen 03/15/2022 Left Internal jugular 03/17/2022  1800  -- 6   Hemodialysis Catheter Right Internal jugular Double lumen Permanent (Tunneled) 01/17/22  1328  Internal jugular  40   Airway 7.5 mm 02/24/22  1100  -- 2   Small Bore Feeding Tube Right nare Marking at nare/corner of mouth 70 cm 02/23/22  1300  Right nare  3   Wound / Incision (Open or Dehisced) 10/01/18 Leg Left open and bleeding 10/01/18  2234  Leg  1244   Wound / Incision (Open or Dehisced) 10/24/21 Non-pressure wound Heel Right blister/abcess 10/24/21  0035  Heel  125   Wound / Incision (Open or Dehisced) 10/29/21 Irritant Dermatitis (Moisture Associated Skin Damage) Labia Right;Left  small pink area from moisture damage 10/29/21  1519  Labia  120   Wound / Incision (Open or Dehisced) 12/17/21 Other (Comment) Foot Right;Lower 12/17/21  --  Foot  71   Wound / Incision (Open or Dehisced) 01/20/22 Non-pressure wound Labia 01/20/22  0147  Labia  37   Wound / Incision (Open or Dehisced) 02/22/22 Non-pressure wound Foot Right bleeding 02/22/22  0500  Foot  4   Wound / Incision (Open or Dehisced) 02/25/22 Thigh Right;Medial;Mid POPPED BULLA 02/25/22  1000  Thigh  1   Wound / Incision (Open or Dehisced) 02/25/22 Thigh Left;Medial;Mid POPPED BULLA 6X6 CM 02/25/22  1000   Thigh  1   Wound / Incision (Open or Dehisced) 02/24/22 Other (Comment) Flank Lower;Right;Lateral POPPED BLLISTERS 02/24/22  0800  Flank  2   Wound / Incision (Open or Dehisced)  Flank Left;Lower;Lateral POPPED BLISTERS --  --  Flank  --           WBC up with steroids challenge LP neg for infection MRI unrevealing Coox not accurate  Resolved Hospital Problem list     Assessment & Plan:  Persistent shock- extensive negative workup to date except some enterobacter in urine which has been treated, no change with abx, stress steroids NSTEMI, Hx CABG, cardiomyopathy- NOMI vs. OMI in question, not cath candidate at present; really would like accurate coox, to date they have been drawn off A line. Unexplained encephalopathy- again extensive workup and again no real answer ESRD with renal vasoplegia- baseline low Bps Coagulopathy NOS- maybe DIC? TEG not helpful, h/h stable Severe PVD, nonhealing LE wounds- angio at some point with VVS if she recovers  - RN to draw coox off CVL, if low may challenge with inotropes - DC all abx - DC all sedation - DC stress steroids - CRRT even pull - Levophed/vasopressin to MAP 60 - Increase midodrine, start droxidopa titrate to effect to get off pressors - Family meeting this afternoon with PMT on what to do if we can't turn things around - Appreciate help of all consultants below to help figure this lady's physiology out  Consulting services: Neuro, Cardiology, Nephro, ID, VVS, PMT  Best Practice (right click and "Reselect all SmartList Selections" daily)   Diet/type: CorTrak TF DVT prophylaxis: holding d/t bleeding GI prophylaxis: PPI Lines: Tunneled Dialysis Catheter, Central Line, Aline Foley:  N/A Code Status:  full code Last date of multidisciplinary goals of care discussion: PMT consulted 12/5  34 min cc time [name] Emery MD PCCM

## 2022-02-26 NOTE — Progress Notes (Signed)
[facility] Progress Note  Subjective: pt seen in ICU. Net +0.6L yesterday.  Remains intubated.   CRRT running fine after circuit heparin reintroduced.  MRI concern for meningitis, LP done, CSF cell count inconsistent with meningitis.   Vitals:   02/26/22 0700 02/26/22 0800 02/26/22 0811 02/26/22 0840  BP: (!) 82/49 (!) 88/39  (!) 133/55  Pulse: 100 (!) 108 (!) 111   Resp: (!) 0 (!) 0 (!) 9 (!) 24  Temp:   (!) 97.5 F (36.4 C)   TempSrc:   Axillary   SpO2: 100% 100% 97%   Weight:        Exam: Gen intubated, obese No rash, cyanosis or gangrene No jvd or bruits Chest coarse ant on vent RRR no MRG Abd soft ntnd no mass or ascites +bs Ext diffuse 1+ bilat hip > pretib/ abd wall edema, LE bullous lesions noted on inner thighs - have dressings over today Neuro sedated    RIJ TDC in place          Home meds include - albuterol, aspirin, effient, uloric, fenofibrate, insulin lispro, midodrine 10 tid, sl ntg, ranexa, crestor, lexapro, lopressor 25 bid, oxy IR prn, prns/ vits/ supps     CXR - CM and vasc congestion   Creat 5.2  K 3.9  BUn 61  Na 130   WBC 26 K  Hb 9.1     LA 5.1 > 2.6 ,  PC 4.0    BCx's neg x 2 < 12 hrs    BP 96/47, HR 80, RR 10--20  afebrile    OP HD: MWF [place] 4h  400/ 600   137.5kg  2/2 bath  RIJ TDC   Hep 5000x 1 - venofer 100 tiw IV thru 12/11 - mircera 100 q2, last 11/20, due 12/04 - last HD 11/29 (Wed), post wt 137.5kg  - dry wt has been dropping     Assessment/ Plan: Sepsis/ shock - w/ lactic acidosis, UTI. On IV abx and levo + vaso gtt, midodrine.  Broad spectrum coverage  - urine with enterobacter, blood cx NGTD, CSF no meningitis.  Getting stress dose steroids too.  AMS - secondary to sepsis, CT unrevealing MRI findings concerning for meningitis vs artifact; CSF no meningitis.  Chronic hypotension - takes midodrine at home. Levo + vaso gtt here.  Chronic resp faliure - due to OHS/ OSA/ obesity, on vent Volume  - run even to net  net 50/hr today.  CAD h/o CABG 2010 ESRD - usual HD MWF, missed HD Friday. CRRT started 12/02 due to sepsis/ shock and vol overload. Cont CRRT.  Primary added back circuit heparin 12/6 after clotted. Running fine now.  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 Transfuse < 8 MBD ckd - CCa and phos in range. Not on any vdra. Follow.     Jannifer Hick MD [facility] Pager [phone]    Recent Labs  Lab 02/25/22 0335 02/25/22 0926 02/25/22 1257 02/26/22 0322  HGB 8.5*  --   --  8.9*  ALBUMIN 1.5*   < > <1.5* <1.5*  CALCIUM 8.1*  --  8.4* 8.3*  PHOS 2.7  --  3.7 3.4  CREATININE 1.42*  --  1.74* 1.47*  K 4.3  --  4.6 4.8   < > = values in this interval not displayed.    Recent Labs  Lab 02/25/22 1257  FERRITIN  780*    Inpatient medications:  artificial tears   Both Eyes Q8H   Chlorhexidine Gluconate Cloth  6 each Topical Daily   darbepoetin (ARANESP) injection - DIALYSIS  100 mcg Subcutaneous Q Mon-1800   docusate  200 mg Per Tube BID   droxidopa  100 mg Per Tube Q8H   famotidine  10 mg Per Tube Daily   insulin aspart  0-15 Units Subcutaneous Q4H   midodrine  15 mg Per Tube Q8H   multivitamin  1 tablet Per Tube QHS   mouth rinse  15 mL Mouth Rinse Q2H   polyethylene glycol  17 g Per Tube BID   sennosides  10 mL Per Tube BID   [START ON 03/06/2022] thiamine (VITAMIN B1) injection  100 mg Intravenous Daily     prismasol BGK 4/2.5 400 mL/hr at 02/26/22 0059    prismasol BGK 4/2.5 400 mL/hr at 02/26/22 0059   sodium chloride     sodium chloride 10 mL/hr at 02/26/22 0900   albumin human     feeding supplement (VITAL 1.5 CAL) 20 mL/hr at 02/26/22 0900   heparin 10,000 units/ 20 mL infusion syringe 750 Units/hr (02/26/22 0900)   norepinephrine (LEVOPHED) Adult infusion 15 mcg/min (02/26/22 0901)   prismasol BGK 4/2.5 1,500 mL/hr at 02/26/22 0901   thiamine (VITAMIN B1) injection Stopped  (02/26/22 0964)   Followed by   Derrill Memo ON 02/28/2022] thiamine (VITAMIN B1) injection     vasopressin 0.04 Units/min (02/26/22 0901)   sodium chloride, acetaminophen, heparin, lip balm

## 2022-02-26 NOTE — Progress Notes (Signed)
Daily Progress Note   Patient Name: [name]       Date: 02/26/2022 DOB: [birth year]  Age: 56 y.o. MRN#: [id number] Attending Physician: Candee Furbish, MD Primary Care Physician: Jacklynn Ganong, MD Admit Date: 03/01/2022  Reason for Consultation/Follow-up: Establishing goals of care  Subjective: Chart review performed.   2:50 PM Received notification son called PMT phone stating he was running late for meeting - will be at hospital at 3:30p.  3:30 PM Received report from primary RN - no acute concerns. RN states sedation was stopped this morning - patient now more awake; however, not tracking, no purposeful movements, no gag or cough reflex, pupils not reactive.   Went to visit patient and family at bedside for scheduled meeting - only son/[name] present. Patient was lying in bed intubated - she does not respond or make purposeful movements to voice/gentle touch even off sedation. She remains on pressors and tube feeds/coretrak.  4:10 PM Daughter arrived - met with family at bedside - emotional support provided. Allowed family to review information that has been provided to them thus far. Reviewed patient's interval history since admission along with patient's extensive work up. Discussed that to date only confirmed UTI, which has been treated. Reviewed that, unfortunately, nothing else reversible has been found. Provided updated that LP, diagnostic for meningitis, was negative. Reviewed that patient has received treatment for multiple medical concerns despite unremarkable tests to ensure we were not missing anything. Reviewed all the specialists in patient's case, including Neurology, Cardiology, Nephrology, ID, and VVS as well as patient's multiorgan system failure (lungs, kidneys, liver,  brain, heart). Family understand that despite treatments, patient has shown no improvement and continues to decline. RN answered some of their questions regarding physical assessment.  Natural disease trajectory and expectations at EOL were discussed. I attempted to elicit values and goals of care important to the patient. The difference between aggressive medical intervention and comfort care was considered in light of the patient's goals of care. Prognostication reviewed. We discussed that time is likely limited no matter which path is chosen (aggressive vs comfort).  We talked about transition to comfort measures in house and what that would entail inclusive of medications to control pain, dyspnea, agitation, nausea, and itching. We discussed stopping all unnecessary measures such as blood draws, CRRT, ventilator support, pressors,  needle sticks, oxygen, antibiotics, CBGs/insulin, cardiac monitoring, IVF, and frequent vital signs. Education provided that other non-pharmacological interventions would be utilized for holistic support and comfort such as spiritual support if requested, repositioning, music therapy, offering comfort feeds, and/or therapeutic listening.  Allowed space and time for family to reflect on information and discuss. Son requests additional time to process information given to them today. Daughter does seem to be agreeable for transition to full comfort measures. Son is clear that he is "not saying no to comfort" but wants additional time to process and discuss with his sister, which is reasonable.  Encouraged family to consider DNR/DNI status understanding evidenced based poor outcomes in similar hospitalized patient, as the cause of arrest is likely associated with advanced chronic/terminal illness rather than an easily reversible acute cardio-pulmonary event.  I shared that even if we pursued resuscitation we would not able to resolve the underlying factors. I explained that  DNR/DNI does not change the medical plan and it only comes into effect after a person has arrested (died).  It is a protective measure to keep [place] from harming the patient in their last moments of life. Family were not agreeable to DNR/DNI with understanding that she would receive CPR, defibrillation, ACLS medications, in addition to already being intubation. Again, daughter seems agreeable to DNR/DNI today; however, son requests additional time to process information. Daughter feels "blindsided" by patient's clinical situation, but does also state that patient's current state is "only a shell and not my mom."   Family understand that DNR/DNI and transition to full comfort measures are recommended.  Family are agreeable to meet with PMT again tomorrow for ongoing discussions - schedule for 12/8 at 2p.  All questions and concerns addressed. Encouraged to call with questions and/or concerns. PMT card provided.   Length of Stay: 6  Current Medications: Scheduled Meds:   artificial tears   Both Eyes Q8H   aspirin  81 mg Per Tube Daily   Chlorhexidine Gluconate Cloth  6 each Topical Daily   darbepoetin (ARANESP) injection - DIALYSIS  100 mcg Subcutaneous Q Mon-1800   docusate  200 mg Per Tube BID   droxidopa  100 mg Per Tube Q8H   famotidine  10 mg Per Tube Daily   feeding supplement (PROSource TF20)  60 mL Per Tube BID   insulin aspart  0-15 Units Subcutaneous Q4H   midodrine  15 mg Per Tube Q8H   multivitamin  1 tablet Per Tube QHS   mouth rinse  15 mL Mouth Rinse Q2H   polyethylene glycol  17 g Per Tube BID   sennosides  10 mL Per Tube BID   [START ON 03/06/2022] thiamine (VITAMIN B1) injection  100 mg Intravenous Daily    Continuous Infusions:   prismasol BGK 4/2.5 400 mL/hr at 02/26/22 1405    prismasol BGK 4/2.5 400 mL/hr at 02/26/22 1405   sodium chloride     sodium chloride Stopped (02/26/22 1357)   albumin human 25 g (02/26/22 1430)   dexmedetomidine (PRECEDEX) IV infusion      feeding supplement (VITAL 1.5 CAL) 30 mL/hr at 02/26/22 1400   heparin 10,000 units/ 20 mL infusion syringe 750 Units/hr (02/26/22 1000)   norepinephrine (LEVOPHED) Adult infusion 18 mcg/min (02/26/22 1400)   prismasol BGK 4/2.5 1,500 mL/hr at 02/26/22 1238   thiamine (VITAMIN B1) injection 100 mL/hr at 02/26/22 1400   Followed by   Derrill Memo ON 02/28/2022] thiamine (VITAMIN B1) injection     vasopressin 0.04 Units/min (  02/26/22 1400)    PRN Meds: sodium chloride, acetaminophen, fentaNYL (SUBLIMAZE) injection, fentaNYL (SUBLIMAZE) injection, heparin, lip balm  Physical Exam Vitals and nursing note reviewed.  Constitutional:      General: She is not in acute distress.    Appearance: She is ill-appearing.     Interventions: She is intubated.  Pulmonary:     Effort: No respiratory distress. She is intubated.  Skin:    General: Skin is warm and dry.  Neurological:     Motor: Weakness present.     Comments: Awake at times but no purposeful movements/interactions  Psychiatric:        Speech: She is noncommunicative.             Vital Signs: BP (!) 95/45   Pulse (!) 103   Temp 97.8 F (36.6 C) (Axillary)   Resp (!) 0   Wt 128.6 kg   SpO2 100%   BMI 44.40 kg/m  SpO2: SpO2: 100 % O2 Device: O2 Device: Ventilator O2 Flow Rate: O2 Flow Rate (L/min): 2 L/min  Intake/output summary:  Intake/Output Summary (Last 24 hours) at 02/26/2022 1442 Last data filed at 02/26/2022 1400 Gross per 24 hour  Intake 2943.7 ml  Output 2755.7 ml  Net 188 ml   LBM: Last BM Date : March 14, 2022 Baseline Weight: Weight: (!) 137 kg Most recent weight: Weight: 128.6 kg       Palliative Assessment/Data: PPS 30% on tube feeds      Patient Active Problem List   Diagnosis Date Noted   Malnutrition of moderate degree 37/12/6267   Acute metabolic encephalopathy 48/54/6270   Septic shock ([place]) 03/01/2022   ESRD (end stage renal disease) on dialysis ([place]) 03/12/2022   Chronic respiratory failure with  hypoxia ([place]) 03/08/2022   UTI (urinary tract infection) 03/05/2022   Lactic acid acidosis 02/22/2022   Hyperkalemia, diminished renal excretion 01/16/2022   Uremic encephalopathy 01/16/2022   Weakness generalized 01/06/2022   Palliative care by specialist 01/06/2022   Hypertensive kidney disease with CKD (chronic kidney disease) stage V ([place]) 35/00/9381   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 12/29/2021   Diabetic foot ulcer ([place]) 12/17/2021   Elevated LFTs    Diabetic infection of right foot ([place])    RVF (right ventricular failure) (HCC)    NSTEMI (non-ST elevated myocardial infarction) ([place]) 10/25/2021   Acute on chronic heart failure with preserved ejection fraction (HFpEF) ([place]) 10/25/2021   Acute renal failure superimposed on stage 4 chronic kidney disease ([place]) 10/25/2021   Wound of right foot 10/25/2021   Insulin dependent type 2 diabetes mellitus ([place]) 10/25/2021   Anemia of chronic disease 10/25/2021   Chest pain 10/01/2018   Screening for cervical cancer 03/14/2018   Atherosclerosis 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 ([place]) 08/12/2017   Diarrhea 05/20/2016   Controlled substance agreement signed 01/07/2016   Chronic back pain 08/25/2013   Gastroenteritis 07/31/2013   Gout 07/31/2013   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   Acute renal failure ([place]) 07/26/2013   Chronic kidney disease, stage III (moderate) ([place]) 07/26/2013   Vomiting 07/26/2013   Medication management 05/08/2013   Dysthymia 10/28/2012   Acute myocardial infarction, subsequent episode of care ([place]) 06/27/2012   Status post percutaneous transluminal coronary angioplasty 06/27/2012   Obesity, morbid, BMI 50 or higher ([place]) 09/12/2011   OSA (obstructive sleep apnea) 09/12/2011   CHEST PAIN 02/04/2010   Chronic ischemic heart disease 01/24/2010   Congestive heart failure ([place]) 01/24/2010  PALPITATIONS 07/22/2009   Hyperlipidemia  associated with type 2 diabetes mellitus ([place]) 05/07/2009   Hypertension associated with diabetes ([place]) 05/07/2009   CAD (coronary artery disease) 05/07/2009   Chronic diastolic heart failure ([place]) 05/07/2009   Other postprocedural status(V45.89) 03/50/0938   Umbilical hernia with gangrene 05/16/2007    Palliative Care Assessment & Plan   Patient Profile:  56 y.o. female  with past medical history of DM2, HTN, HLD, Morbid Obesity, OSA, CKD 5, CAD/CABG, Chronic Diastolic CHF. Patient was brought to [facility] from [facility] where she has been alerted for the last three days. At baseline, she is A/Ox4 and can ambulate with assistance. She was recently diagnosed with a UTI (11/28) and was given Cirpo as well as a gram of Rocephin. She was admitted on 02/20/2022 with AMS in the setting of septic shock.    PMT was consulted for [place] conversations given failure to improve.  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   Acute metabolic encephalopathy   ESRD (end stage renal disease) on dialysis (HCC)   Chronic respiratory failure with hypoxia (HCC)   UTI (urinary tract infection)   Lactic acid acidosis   Malnutrition of moderate degree   Concern about end of life  Recommendations/Plan: Continue full code/full scope Family considering DNR/DNI and transition to full comfort - they request additional time to process information from today Another meeting scheduled for tomorrow 12/8 at 2p for ongoing discussions PMT will continue to follow and support holistically  Goals of Care and Additional Recommendations: Limitations on Scope of Treatment: Full Scope  Treatment  Code Status:    Code Status Orders  (From admission, onward)           Start     Ordered   03/08/2022 1513  Full code  Continuous        03/11/2022 1513           Code Status History     Date Active Date Inactive Code Status Order ID Comments User Context   01/16/2022 2227 02/04/2022 0721 Full Code [id number]  Etta Quill, DO Inpatient   12/17/2021 0359 01/06/2022 2143 Full Code [id number]  Vianne Bulls, MD Inpatient   10/25/2021 0101 11/05/2021 2202 Full Code [id number]  Lenore Cordia, MD Inpatient   10/01/2018 2219 10/17/2018 2118 Full Code [id number]  Nila Nephew, MD Inpatient       Prognosis:  Poor  Discharge Planning: To Be Determined  Care plan was discussed with primary RN, patient's daughter and son, Dr. Tamala Julian  Thank you for allowing the Palliative Medicine Team to assist in the care of this patient.   Total Time 70 minutes Prolonged Time Billed  yes       Greater than 50%  of this time was spent counseling and coordinating care related to the above assessment and plan.  Lin Landsman, NP  Please contact Palliative Medicine Team phone at [phone] for questions and concerns.   *Portions of this note are a verbal dictation therefore any spelling and/or grammatical errors are due to the "[place] One" system interpretation.

## 2022-02-26 NOTE — Procedures (Addendum)
[facility]  Routine EEG Report   Demographics: Patient Name:   [name]  Date of Birth:   October 20, 1965  Identification Number:   MRN - [id number]  Study Times:24 minutes  Indication(s): Encephalopathy  Technical Summary:  This EEG was performed utilizing standard International 10-20 System of electrode placement. Data were obtained and interpreted utilizing referential montage recording, with reformatting to longitudinal, transverse bipolar, and referential montages as necessary for interpretation.  State(s):      Coma (not medically induced)      Medically Induced Coma   Activation Procedures: Hyperventilation: Not performed Photic Stimulation: Not performed   EEG Description:  No normal patterns of awake or sleep were present during this recording. Intermittent generalized theta-delta slowing was present. Excess continuous generalized theta-delta slowing was present. Triphasic waves are present. Occasional periods of brief generalized suppression are noted. Severe myogenic and electrode artifact are present throughout the recording and preclude a more thorough interpretation of the data.  Impression:  Within the limits of this study, Abnormal EEG due to:    1) No normal patterns of awake or sleep  2) Intermittent generalized theta-delta slowing  3) Continuous generalized theta-delta slowing  4) Triphasic waves  5) Occasional periods of brief generalized suppression   Clinical Correlation:  Within the limits of this study: This EEG is suggestive of a moderate-severe encephalopathy, but is nonspecific as to etiology. The absence of epileptiform abnormalities does not preclude a clinical diagnosis of seizures.  If clinical concern for seizure persists, suggest repetition of the EEG with artifact reduction, and/or when the patient is in a more relaxed state.  Nicolette Bang) [place] Board of  Psychiatry and Neurology (ABPN); American Board of Clinical Neurophysiology  (ABCN)       [facility] For Inpatient follow-up with [facility] physician please call [facility] [phone]. This is not an outpatient service. Post hospital discharge, please contact hospital directly.

## 2022-02-26 NOTE — Progress Notes (Signed)
Pharmacy Antibiotic Note  [name] is a 56 y.o. female admitted on 03/20/2022 presenting AMS, concern for sepsis.  Pharmacy has been consulted for meropenem and vancomycin dosing.   Patient febrile to 100.5, wbc 34. She continues on crrt. New orders to restart broad antibiotics.   12/1 dapto >12/6 12/1 cefepime >12/6 12/5 flagyl >12/7 12/6 linezolid>12/7   12/5 CK - 430   12/1 BCx - NG x5 days 12/1 mrsa pcr - neg 12/1 UCx - Enterobacter Aerogenes, 50K yeast 12/6 LP - neg   Plan: Meropenem 1g q8 hours Zyvox '600mg'$  q12 hours    Weight: 128.6 kg (283 lb 8.2 oz)  Temp (24hrs), Avg:98.2 F (36.8 C), Min:97.5 F (36.4 C), Max:99.2 F (37.3 C)  Recent Labs  Lab 02/22/22 0345 02/22/22 1600 02/23/22 0330 02/23/22 1509 02/24/22 0136 02/24/22 0916 02/24/22 1703 02/24/22 1950 02/25/22 0335 02/25/22 0926 02/25/22 1257 02/25/22 1514 02/25/22 1809 02/26/22 0322 02/26/22 1531 02/26/22 1624  WBC 26.3*  --  24.7*  --  26.0*  --   --   --  23.1*  --   --   --   --  34.1*  --   --   CREATININE 3.65*   < > 2.27*   < > 1.76*  --  1.51*  --  1.42*  --  1.74*  --   --  1.47* 1.26*  --   LATICACIDVEN 3.2*  --  2.4*  --   --    < > 4.9* 4.5*  --  4.8*  --  7.6* 6.3*  --   --  8.6*   < > = values in this interval not displayed.     Estimated Creatinine Clearance: 68.7 mL/min (A) (by C-G formula based on SCr of 1.26 mg/dL (H)).    Allergies  Allergen Reactions   Bactrim [Sulfamethoxazole-Trimethoprim] Other (See Comments)    Skin peeled off   Diphenhydramine-Acetaminophen Hives   Benadryl [Diphenhydramine] Hives   Cephalosporins Anxiety   Ms Contin [Morphine] Other (See Comments)    Severe somnolence   Tylenol Pm Extra [Diphenhydramine-Apap (Sleep)] Hives   Valium [Diazepam] Hives   Zocor [Simvastatin] Other (See Comments)    Arthralgia    Vancomycin Other (See Comments)    Skin peeling due to one of the following: Vancomycin, flagyl, or rocephin. Vanc seems to be the  most likely culprit. ? SJS, interestingly h/o "skin peeled off" to bactrim as well (sounds really suspicious for SJS to me).   Ceftriaxone Rash   Cipro [Ciprofloxacin Hcl] Anxiety   Latex Itching   Levaquin [Levofloxacin] Anxiety    Erin Hearing PharmD., BCPS Clinical Pharmacist 02/26/2022 6:44 PM

## 2022-02-26 NOTE — Progress Notes (Signed)
Lactate up. 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 Will add vanc/meropenem as last ditch effort but expect death within the next day or two.  Erskine Emery

## 2022-02-26 NOTE — Progress Notes (Signed)
EEG complete - results pending 

## 2022-02-27 DIAGNOSIS — Z66 Do not resuscitate: Secondary | ICD-10-CM

## 2022-02-27 DIAGNOSIS — N186 End stage renal disease: Secondary | ICD-10-CM | POA: Diagnosis not present

## 2022-02-27 DIAGNOSIS — R6521 Severe sepsis with septic shock: Secondary | ICD-10-CM | POA: Diagnosis not present

## 2022-02-27 DIAGNOSIS — G9340 Encephalopathy, unspecified: Secondary | ICD-10-CM | POA: Diagnosis not present

## 2022-02-27 DIAGNOSIS — A419 Sepsis, unspecified organism: Secondary | ICD-10-CM | POA: Diagnosis not present

## 2022-02-27 DIAGNOSIS — N3001 Acute cystitis with hematuria: Secondary | ICD-10-CM | POA: Diagnosis not present

## 2022-02-27 LAB — COOXEMETRY PANEL
Carboxyhemoglobin: 1.4 % (ref 0.5–1.5)
Methemoglobin: <0.7 % (ref 0.0–1.5)
O2 Saturation: 63 %
Total hemoglobin: 7.8 g/dL — ABNORMAL LOW (ref 12.0–16.0)

## 2022-02-27 LAB — BLOOD GAS, ARTERIAL
Acid-base deficit: 9.2 mmol/L — ABNORMAL HIGH (ref 0.0–2.0)
Bicarbonate: 15.3 mmol/L — ABNORMAL LOW (ref 20.0–28.0)
Drawn by: 33176
O2 Saturation: 99.7 %
Patient temperature: 37.1
pCO2 arterial: 29 mmHg — ABNORMAL LOW (ref 32–48)
pH, Arterial: 7.33 — ABNORMAL LOW (ref 7.35–7.45)
pO2, Arterial: 157 mmHg — ABNORMAL HIGH (ref 83–108)

## 2022-02-27 LAB — POCT I-STAT 7, (LYTES, BLD GAS, ICA,H+H)
Acid-base deficit: 16 mmol/L — ABNORMAL HIGH (ref 0.0–2.0)
Bicarbonate: 10.8 mmol/L — ABNORMAL LOW (ref 20.0–28.0)
Calcium, Ion: 1.2 mmol/L (ref 1.15–1.40)
HCT: 26 % — ABNORMAL LOW (ref 36.0–46.0)
Hemoglobin: 8.8 g/dL — ABNORMAL LOW (ref 12.0–15.0)
O2 Saturation: 99 %
Patient temperature: 98.4
Potassium: 4.5 mmol/L (ref 3.5–5.1)
Sodium: 137 mmol/L (ref 135–145)
TCO2: 12 mmol/L — ABNORMAL LOW (ref 22–32)
pCO2 arterial: 29.4 mmHg — ABNORMAL LOW (ref 32–48)
pH, Arterial: 7.175 — CL (ref 7.35–7.45)
pO2, Arterial: 198 mmHg — ABNORMAL HIGH (ref 83–108)

## 2022-02-27 LAB — RENAL FUNCTION PANEL
Albumin: 2.8 g/dL — ABNORMAL LOW (ref 3.5–5.0)
Albumin: 2.2 g/dL — ABNORMAL LOW (ref 3.5–5.0)
Anion gap: 19 — ABNORMAL HIGH (ref 5–15)
Anion gap: 19 — ABNORMAL HIGH (ref 5–15)
BUN: 20 mg/dL (ref 6–20)
BUN: 27 mg/dL — ABNORMAL HIGH (ref 6–20)
CO2: 12 mmol/L — ABNORMAL LOW (ref 22–32)
CO2: 16 mmol/L — ABNORMAL LOW (ref 22–32)
Calcium: 8.7 mg/dL — ABNORMAL LOW (ref 8.9–10.3)
Calcium: 8.7 mg/dL — ABNORMAL LOW (ref 8.9–10.3)
Chloride: 102 mmol/L (ref 98–111)
Chloride: 101 mmol/L (ref 98–111)
Creatinine, Ser: 1.13 mg/dL — ABNORMAL HIGH (ref 0.44–1.00)
Creatinine, Ser: 1.39 mg/dL — ABNORMAL HIGH (ref 0.44–1.00)
GFR, Estimated: 57 mL/min — ABNORMAL LOW (ref >60)
GFR, Estimated: 44 mL/min — ABNORMAL LOW (ref >60)
Glucose, Bld: 202 mg/dL — ABNORMAL HIGH (ref 70–99)
Glucose, Bld: 291 mg/dL — ABNORMAL HIGH (ref 70–99)
Phosphorus: 3.2 mg/dL (ref 2.5–4.6)
Phosphorus: 3.5 mg/dL (ref 2.5–4.6)
Potassium: 4.1 mmol/L (ref 3.5–5.1)
Potassium: 3.9 mmol/L (ref 3.5–5.1)
Sodium: 133 mmol/L — ABNORMAL LOW (ref 135–145)
Sodium: 136 mmol/L (ref 135–145)

## 2022-02-27 LAB — GLUCOSE, CAPILLARY
Glucose-Capillary: 213 mg/dL — ABNORMAL HIGH (ref 70–99)
Glucose-Capillary: 168 mg/dL — ABNORMAL HIGH (ref 70–99)
Glucose-Capillary: 125 mg/dL — ABNORMAL HIGH (ref 70–99)
Glucose-Capillary: 272 mg/dL — ABNORMAL HIGH (ref 70–99)
Glucose-Capillary: 32 mg/dL — CL (ref 70–99)

## 2022-02-27 LAB — CBC
HCT: 23.4 % — ABNORMAL LOW (ref 36.0–46.0)
Hemoglobin: 7.3 g/dL — ABNORMAL LOW (ref 12.0–15.0)
MCH: 26.9 pg (ref 26.0–34.0)
MCHC: 31.2 g/dL (ref 30.0–36.0)
MCV: 86.3 fL (ref 80.0–100.0)
Platelets: 66 10*3/uL — ABNORMAL LOW (ref 150–400)
RBC: 2.71 MIL/uL — ABNORMAL LOW (ref 3.87–5.11)
RDW: 19.9 % — ABNORMAL HIGH (ref 11.5–15.5)
WBC: 32.1 10*3/uL — ABNORMAL HIGH (ref 4.0–10.5)
nRBC: 11.3 % — ABNORMAL HIGH (ref 0.0–0.2)

## 2022-02-27 LAB — LACTIC ACID, PLASMA
Lactic Acid, Venous: >9 mmol/L (ref 0.5–1.9)
Lactic Acid, Venous: >9 mmol/L (ref 0.5–1.9)

## 2022-02-27 LAB — APTT: aPTT: 94 seconds — ABNORMAL HIGH (ref 24–36)

## 2022-02-27 LAB — MAGNESIUM: Magnesium: 2.4 mg/dL (ref 1.7–2.4)

## 2022-02-27 MED ORDER — GLYCOPYRROLATE 0.2 MG/ML IJ SOLN: 0.2000 mg | INTRAMUSCULAR | Status: DC | PRN

## 2022-02-27 MED ORDER — SODIUM BICARBONATE 8.4 % IV SOLN
100.0000 meq | Freq: Once | INTRAVENOUS | Status: AC
  Administered 2022-02-27: 100 meq via INTRAVENOUS
  Filled 2022-02-27: qty 100

## 2022-02-27 MED ORDER — POLYVINYL ALCOHOL 1.4 % OP SOLN
1.0000 [drp] | Freq: Four times a day (QID) | OPHTHALMIC | Status: DC | PRN
  Filled 2022-02-27: qty 15

## 2022-02-27 MED ORDER — SODIUM BICARBONATE 8.4 % IV SOLN
INTRAVENOUS | Status: AC
  Filled 2022-02-27: qty 50

## 2022-02-27 MED ORDER — HYDROMORPHONE BOLUS VIA INFUSION
1.0000 mg | INTRAVENOUS | Status: DC | PRN
  Administered 2022-02-27 (×3): 2 mg via INTRAVENOUS

## 2022-02-27 MED ORDER — GLYCOPYRROLATE 1 MG PO TABS: 1.0000 mg | ORAL_TABLET | ORAL | Status: DC | PRN

## 2022-02-27 MED ORDER — HYDROMORPHONE HCL-NACL 50-0.9 MG/50ML-% IV SOLN
0.5000 mg/h | INTRAVENOUS | Status: DC
  Administered 2022-02-27: 0.5 mg/h via INTRAVENOUS
  Filled 2022-02-27: qty 50

## 2022-02-27 MED ORDER — SODIUM CHLORIDE 0.9 % IV SOLN: INTRAVENOUS | Status: DC

## 2022-02-27 MED ORDER — STERILE WATER FOR INJECTION IV SOLN
INTRAVENOUS | Status: DC
  Filled 2022-02-27 (×2): qty 150

## 2022-02-27 MED ORDER — STERILE WATER FOR INJECTION IV SOLN
INTRAVENOUS | Status: DC
  Filled 2022-02-27 (×3): qty 150

## 2022-02-27 MED ORDER — DOBUTAMINE IN D5W 4-5 MG/ML-% IV SOLN
2.5000 ug/kg/min | INTRAVENOUS | Status: DC
  Administered 2022-02-27: 2.5 ug/kg/min via INTRAVENOUS
  Filled 2022-02-27: qty 250

## 2022-02-28 LAB — CSF CULTURE W GRAM STAIN: Culture: NO GROWTH

## 2022-03-02 LAB — ANAEROBIC CULTURE W GRAM STAIN

## 2022-03-02 LAB — CYTOLOGY - NON PAP

## 2022-03-03 LAB — ZINC: Zinc: 54 ug/dL (ref 44–115)

## 2022-03-05 LAB — VITAMIN B1: Vitamin B1 (Thiamine): 175.3 nmol/L (ref 66.5–200.0)

## 2022-03-18 LAB — CULTURE, FUNGUS WITHOUT SMEAR

## 2022-03-23 NOTE — Progress Notes (Signed)
Patient ID: [name], female   DOB: Jan 18, 1966, 57 y.o.   MRN: [id number]     Advanced Heart Failure Rounding Note  HF Cardiologist: Dr. Aundra Dubin  Subjective:    Lactate 8.6 yesterday.  No co-ox today.  She remains on NE 21, vasopressin 0.04, midodrine 15 mg tid. MAP running 60-65.   WBCs 32, she is on meropenem and linezolid. Afebrile.   CVP 9, CVVH running even.   Remains intubated, eyes open but not responsive.    Objective:   Weight Range: 128.6 kg Body mass index is 44.4 kg/m.   Vital Signs:   Temp:  [97.8 F (36.6 C)-98.4 F (36.9 C)] 98.2 F (36.8 C) (12/08 0721) Pulse Rate:  [100-126] 126 (12/08 0400) Resp:  [0-34] 18 (12/08 0700) BP: (59-133)/(24-99) 87/24 (12/08 0400) SpO2:  [86 %-100 %] 88 % (12/08 0400) Arterial Line BP: (87-297)/(34-290) 127/47 (12/08 0830) FiO2 (%):  [40 %-100 %] 40 % (12/08 0400) Weight:  [128.6 kg] 128.6 kg (12/08 0500) Last BM Date : 2022-03-14  Weight change: Filed Weights   02/24/22 0219 02/25/22 0500 2022/03/14 0500  Weight: 127.7 kg 128.6 kg 128.6 kg    Intake/Output:   Intake/Output Summary (Last 24 hours) at 03/14/22 0839 Last data filed at Mar 14, 2022 0800 Gross per 24 hour  Intake 4246.62 ml  Output 3223.5 ml  Net 1023.12 ml      Physical Exam    General: Intubated Neck: Thick, JVP difficult, no thyromegaly or thyroid nodule.  Lungs: Decreased at bases. CV: Nondisplaced PMI.  Heart regular S1/S2, no S3/S4, no murmur.  2+ chronic edema to knees.  Abdomen: Soft, no hepatosplenomegaly, no distention.  Skin: Venous stasis changes on legs.  Neurologic: Not responsive Extremities: No clubbing or cyanosis.  HEENT: Normal.    Telemetry   NSR 90s (personally reviewed)  Labs    CBC Recent Labs    02/26/22 0322 02/26/22 1458 03-14-2022 0311 03/14/2022 0440  WBC 34.1*  --  32.1*  --   HGB 8.9*   < > 7.3* 8.8*  HCT 27.8*   < > 23.4* 26.0*  MCV 81.8  --  86.3  --   PLT 101*  --  66*  --    < > = values in this  interval not displayed.   Basic Metabolic Panel Recent Labs    02/26/22 0322 02/26/22 1458 02/26/22 1531 14-Mar-2022 0311 03-14-22 0440  NA 134*   < > 134* 133* 137  K 4.8   < > 4.9 4.1 4.5  CL 104  --  106 102  --   CO2 14*  --  15* 12*  --   GLUCOSE 234*  --  211* 202*  --   BUN 25*  --  21* 20  --   CREATININE 1.47*  --  1.26* 1.13*  --   CALCIUM 8.3*  --  8.7* 8.7*  --   MG 2.4  --   --  2.4  --   PHOS 3.4  --  3.6 3.2  --    < > = values in this interval not displayed.   Liver Function Tests Recent Labs    02/25/22 0926 02/25/22 1257 02/26/22 1531 03/14/22 0311  AST 111*  --   --   --   ALT 51*  --   --   --   ALKPHOS 111  --   --   --   BILITOT 6.2*  --   --   --  PROT 5.5*  --   --   --   ALBUMIN <1.5*   < > 2.0* 2.8*   < > = values in this interval not displayed.   No results for input(s): "LIPASE", "AMYLASE" in the last 72 hours. Cardiac Enzymes No results for input(s): "CKTOTAL", "CKMB", "CKMBINDEX", "TROPONINI" in the last 72 hours.   BNP: BNP (last 3 results) Recent Labs    01/27/22 0435 01/28/22 0639 03/20/2022 1609  BNP >4,500.0* 3,263.3* >4,500.0*    ProBNP (last 3 results) No results for input(s): "PROBNP" in the last 8760 hours.   D-Dimer No results for input(s): "DDIMER" in the last 72 hours.  Hemoglobin A1C No results for input(s): "HGBA1C" in the last 72 hours. Fasting Lipid Panel Recent Labs    02/26/22 0322  TRIG 86   Thyroid Function Tests Recent Labs    02/25/22 0926  TSH 3.546    Other results:   Imaging    EEG adult  Result Date: 02/26/2022 Landry Corporal, MD     02/26/2022  1:06 PM [facility] Routine EEG Report Demographics: Patient Name:   [name] Date of Birth:   [birth year] Identification Number:   MRN - [id number] Study Times:24 minutes Indication(s): Encephalopathy Technical Summary: This EEG was performed utilizing standard International 10-20  System of electrode placement. Data were obtained and interpreted utilizing referential montage recording, with reformatting to longitudinal, transverse bipolar, and referential montages as necessary for interpretation. State(s):      Coma (not medically induced)      Medically Induced Coma Activation Procedures: Hyperventilation: Not performed Photic Stimulation: Not performed EEG Description: No normal patterns of awake or sleep were present during this recording. Intermittent generalized theta-delta slowing was present. Excess continuous generalized theta-delta slowing was present. Triphasic waves are present. Occasional periods of brief generalized suppression are noted. Severe myogenic and electrode artifact are present throughout the recording and preclude a more thorough interpretation of the data. Impression: Within the limits of this study, Abnormal EEG due to:   1) No normal patterns of awake or sleep  2) Intermittent generalized theta-delta slowing  3) Continuous generalized theta-delta slowing  4) Triphasic waves  5) Occasional periods of brief generalized suppression Clinical Correlation: Within the limits of this study: This EEG is suggestive of a moderate-severe encephalopathy, but is nonspecific as to etiology. The absence of epileptiform abnormalities does not preclude a clinical diagnosis of seizures. If clinical concern for seizure persists, suggest repetition of the EEG with artifact reduction, and/or when the patient is in a more relaxed state. Nicolette Bang) [place] [employer] and Neurology (ABPN); American Board of Clinical Neurophysiology  (ABCN) [facility] For Inpatient follow-up with [facility] physician please call [facility] [phone]. This is not an outpatient service. Post hospital discharge, please contact hospital directly.     Medications:     Scheduled Medications:  artificial tears   Both Eyes Q8H   aspirin  81 mg Per Tube Daily    Chlorhexidine Gluconate Cloth  6 each Topical Daily   darbepoetin (ARANESP) injection - DIALYSIS  100 mcg Subcutaneous Q Mon-1800   docusate  200 mg Per Tube BID   droxidopa  100 mg Per Tube Q8H   famotidine  10 mg Per Tube Daily   feeding supplement (PROSource TF20)  60 mL Per Tube BID   insulin aspart  0-15 Units Subcutaneous Q4H   midodrine  15 mg Per Tube Q8H   multivitamin  1 tablet Per  Tube QHS   mouth rinse  15 mL Mouth Rinse Q2H   polyethylene glycol  17 g Per Tube BID   sennosides  10 mL Per Tube BID   [START ON 03/06/2022] thiamine (VITAMIN B1) injection  100 mg Intravenous Daily    Infusions:  sodium chloride     sodium chloride Stopped (03-20-22 0727)   dexmedetomidine (PRECEDEX) IV infusion     DOBUTamine     feeding supplement (VITAL 1.5 CAL) 50 mL/hr at 2022-03-20 0800   heparin 10,000 units/ 20 mL infusion syringe 750 Units/hr (03/20/22 0800)   linezolid (ZYVOX) IV Stopped (02/26/22 2308)   meropenem (MERREM) IV Stopped (2022-03-20 0545)   norepinephrine (LEVOPHED) Adult infusion 21 mcg/min (03/20/22 0800)   prismasol BGK 4/2.5 1,500 mL/hr at 03/20/2022 0754   sodium bicarbonate 150 mEq in sterile water 1,150 mL infusion 400 mL/hr at 20-Mar-2022 0643   sodium bicarbonate 150 mEq in sterile water 1,150 mL infusion 400 mL/hr at Mar 20, 2022 0643   thiamine (VITAMIN B1) injection Stopped (03/20/22 0640)   Followed by   Derrill Memo ON 02/28/2022] thiamine (VITAMIN B1) injection     vasopressin 0.04 Units/min (March 20, 2022 0800)    PRN Medications: sodium chloride, acetaminophen, fentaNYL (SUBLIMAZE) injection, fentaNYL (SUBLIMAZE) injection, heparin, lip balm   Assessment/Plan   1.  Shock: Suspect mixed septic/cardiogenic shock.  Procalcitonin 4.07.  She had Enterobacter in urine. Blood cultures negative but had already received abx. LP not consistent with meningitis.  Echo with newly reduced EF 25-30%. Known extensive CAD and elevated HS troponin peaked at 1,984. New Q waves in inferior  and lateral leads. Suspect she occluded a vein graft within the last few days.  No co-ox today, lactate still elevated yesterday. She remains on NE 21, vasopressin0.04, midodrine 15 tid.  - Send co-ox, lactate today.  - Continue current pressors.  - Will add dobutamine 2.5 2. Acute systolic CHF: Echo with newly decreased EF to 25-30% with wall motion abnormalities.  Pressors as above.  CVP 9.  - Co-ox, lactate today.  - Adding dobutamine.  - Running CVVH even.  - She is not a candidate for advance therapies.  I do not think she would be a candidate for acute angiography/revascularization as suspected occlusion of SVG took place > 48 hrs ago.  3. CAD: S/p CABG in 2011.  Recurrent in-stent restenosis SVG to LAD treated with multiple PCIs.  Most recent cath 08/23: occluded native RCA and SVG to RCA, patent SVG to LAD, patent SVG to D, diffuse disease Lcx. No targets for revascularization.  Hs troponin 1984, trended down. New Q waves inferior and lateral leads. As above suspect she occluded a vein graft somewhere around the time of admission.  - Not candidate for invasive evaluation.  - heparin gtt ongoing.  - ASA 81  4. Acute respiratory failure with hypoxia: Intubated 12/05.  - Vent management per CCM 5. Altered mental status: Possibly secondary to sepsis but growing concern for CNS issue. CT head unrevealing.  MRI brain incomplete suppression of sulcal fluid signal on FLAIR sequences along b/l occipital and temporal convexities which could be artifactual or seen with meningitis.  LP >> no meningitis. Remains nonresponsive.  6. ESRD on HD: On chronic midodrine at home.  - CVVH, running UF even.  7. Bilateral LE wounds:  Followed in wound clinic long-term pre-admission.  Seen by [facility]. 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  Not candidate for invasive  evaluation or advanced therapies.  She is nonresponsive.  Ongoing family meetings regarding future management, I think a palliative comfort approach wound be reasonable.  DNR appropriate as she would likely not survive a cardiac arrest.   Length of Stay: 7   CRITICAL CARE Performed by: Loralie Champagne  Total critical care time: 35 minutes  Critical care time was exclusive of separately billable procedures and treating other patients.  Critical care was necessary to treat or prevent imminent or life-threatening deterioration.  Critical care was time spent personally by me on the following activities: development of treatment plan with patient and/or surrogate as well as nursing, discussions with consultants, evaluation of patient's response to treatment, examination of patient, obtaining history from patient or surrogate, ordering and performing treatments and interventions, ordering and review of laboratory studies, ordering and review of radiographic studies, pulse oximetry and re-evaluation of patient's condition.  Loralie Champagne 05-Mar-2022 8:39 AM

## 2022-03-23 NOTE — Progress Notes (Addendum)
Daily Progress Note   Patient Name: [name]       Date: 03-27-2022 DOB: 06/28/1965  Age: 57 y.o. MRN#: [id number] Attending Physician: [name] Seeds, MD Primary Care Physician: [name] Ganong, MD Admit Date: 03/21/2022  Reason for Consultation/Follow-up: Establishing goals of care  Subjective: 12:00 PM Received page from [name] - discussed case in detail. Patient continues to decline - now on three pressors and worsening lactic acidosis. CRRT discontinued due to malfunction.   Received report from primary RN - no acute concerns. RN reports patient moves eyes but still without purposeful movements.   Went to visit patient at bedside - aunt/[name] and uncle/[name] present at bedside. Patient was lying in bed intubated. No signs or non-verbal gestures of pain or discomfort noted. No respiratory distress, increased work of breathing, or secretions noted.   Emotional support provided to family at bedside.   12:21 PM Called and spoke with patient's daughter/[name] - emotional support provided. Offered to conference call in brother/[name] - she prefers to receive information and discuss with him privately. She notes he will receive information better from her. Provided updates as per above on worsening clinical status and concern patient has started her EOL decline. She expresses understanding - she tells me she "doesn't want to see her (mother) suffering." She and [name] were able to continue code status discussions last night - both are agreeable to DNR. [name] is open to patient's transition to full comfort measures today; however, states [name] is not ready to make decisions. Encouraged [name] to continue discussions with him today with goal to have a unified decision. Discussed  restarting/holding CRRT in context of considering transition to full comfort measures today - [name] would like to hold CRRT at this time while she calls to discuss with her brother. [name] will call PMT with final decisions. She states she does not feel an in-person meeting is needed since we have discussed over the phone; [name] was going to attend meeting today via phone.   3:15 PM Received notification daughter/[name] called PMT phone. Spoke with her via phone - emotional support provided. She informs me she and her brother are both agreeable for patient's transition to full comfort measures today. She asks "what will it look like?" We talked about transition to comfort measures in house and what that would  entail inclusive of medications to control pain, dyspnea, agitation, nausea, and itching. We discussed stopping all unnecessary measures such as ventilation support, vasopressors, blood draws, needle sticks, oxygen, tube feeds, antibiotics, CBGs/insulin, cardiac monitoring, IVF, and frequent vital signs. Education provided that other non-pharmacological interventions would be utilized for holistic support and comfort such as spiritual support if requested, repositioning, music therapy, offering comfort feeds, and/or therapeutic listening.   Discussed allowing time for family to visit prior to stopping pressors and extubation. Prognostication reviewed - likely minutes to hours once interventions have been stopped. She and her brother are on the way to the hospital and request these interventions not be discontinued until they both are present at bedside. Anticipated scheduled time family will be at bedside ready for transition is 6pm.   All questions and concerns addressed. Encouraged to call with questions and/or concerns.   Length of Stay: 7  Current Medications: Scheduled Meds:   artificial tears   Both Eyes Q8H   aspirin  81 mg Per Tube Daily   Chlorhexidine Gluconate Cloth  6 each Topical  Daily   darbepoetin (ARANESP) injection - DIALYSIS  100 mcg Subcutaneous Q Mon-1800   docusate  200 mg Per Tube BID   droxidopa  100 mg Per Tube Q8H   famotidine  10 mg Per Tube Daily   feeding supplement (PROSource TF20)  60 mL Per Tube BID   insulin aspart  0-15 Units Subcutaneous Q4H   midodrine  15 mg Per Tube Q8H   multivitamin  1 tablet Per Tube QHS   mouth rinse  15 mL Mouth Rinse Q2H   polyethylene glycol  17 g Per Tube BID   sennosides  10 mL Per Tube BID   [START ON 03/06/2022] thiamine (VITAMIN B1) injection  100 mg Intravenous Daily    Continuous Infusions:  sodium chloride     sodium chloride 10 mL/hr at 16-Mar-2022 1100   DOBUTamine 2.5 mcg/kg/min (2022/03/16 1100)   feeding supplement (VITAL 1.5 CAL) 55 mL/hr at Mar 16, 2022 1100   heparin 10,000 units/ 20 mL infusion syringe 750 Units/hr (2022/03/16 1000)   linezolid (ZYVOX) IV Stopped (03-16-22 1049)   meropenem (MERREM) IV Stopped (2022/03/16 0545)   norepinephrine (LEVOPHED) Adult infusion 17 mcg/min (03-16-22 1100)   prismasol BGK 4/2.5 1,500 mL/hr at 03/16/22 0754   sodium bicarbonate 150 mEq in sterile water 1,150 mL infusion 400 mL/hr at Mar 16, 2022 0919   sodium bicarbonate 150 mEq in sterile water 1,150 mL infusion 400 mL/hr at 03/16/22 0643   thiamine (VITAMIN B1) injection Stopped (Mar 16, 2022 0640)   Followed by   Derrill Memo ON 02/28/2022] thiamine (VITAMIN B1) injection     vasopressin 0.04 Units/min (03/16/2022 1100)    PRN Meds: sodium chloride, acetaminophen, fentaNYL (SUBLIMAZE) injection, fentaNYL (SUBLIMAZE) injection, heparin, lip balm  Physical Exam Vitals and nursing note reviewed.  Constitutional:      General: She is not in acute distress.    Appearance: She is ill-appearing.     Interventions: She is intubated.  Pulmonary:     Effort: No respiratory distress. She is intubated.  Skin:    General: Skin is warm and dry.  Neurological:     Motor: Weakness present.     Comments: Awake at times but no  purposeful movements/interactions  Psychiatric:        Speech: She is noncommunicative.             Vital Signs: BP (!) 87/24 (BP Location: Other (Comment)) Comment (BP Location): L brachial  A-line  Pulse (!) 109   Temp 98.8 F (37.1 C) (Axillary)   Resp (!) 34   Wt 128.6 kg   SpO2 97%   BMI 44.40 kg/m  SpO2: SpO2: 97 % O2 Device: O2 Device: Ventilator O2 Flow Rate: O2 Flow Rate (L/min): 2 L/min  Intake/output summary:  Intake/Output Summary (Last 24 hours) at 03/12/22 1212 Last data filed at 03/12/22 1100 Gross per 24 hour  Intake 4451.85 ml  Output 3355.5 ml  Net 1096.35 ml   LBM: Last BM Date : 2022/03/12 Baseline Weight: Weight: (!) 137 kg Most recent weight: Weight: 128.6 kg       Palliative Assessment/Data: PPS 30% with tube feeds      Patient Active Problem List   Diagnosis Date Noted   Acute on chronic combined systolic and diastolic CHF (congestive heart failure) ([place]) 02/26/2022   Malnutrition of moderate degree 09/32/3557   Acute metabolic encephalopathy 32/20/2542   Septic shock ([place]) 03/07/2022   ESRD (end stage renal disease) on dialysis ([place]) 03/19/2022   Chronic respiratory failure with hypoxia ([place]) 02/21/2022   UTI (urinary tract infection) 03/21/2022   Lactic acid acidosis 02/24/2022   Hyperkalemia, diminished renal excretion 01/16/2022   Uremic encephalopathy 01/16/2022   Weakness generalized 01/06/2022   Palliative care by specialist 01/06/2022   Hypertensive kidney disease with CKD (chronic kidney disease) stage V ([place]) 70/62/3762   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 12/29/2021   Diabetic foot ulcer ([place]) 12/17/2021   Elevated LFTs    Diabetic infection of right foot ([place])    RVF (right ventricular failure) ([place])    NSTEMI (non-ST elevated myocardial infarction) ([place]) 10/25/2021   Acute on chronic heart failure with preserved ejection fraction (HFpEF) ([place]) 10/25/2021   Acute renal failure  superimposed on stage 4 chronic kidney disease ([place]) 10/25/2021   Wound of right foot 10/25/2021   Insulin dependent type 2 diabetes mellitus ([place]) 10/25/2021   Anemia of chronic disease 10/25/2021   Chest pain 10/01/2018   Screening for cervical cancer 03/14/2018   Atherosclerosis 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 ([place]) 08/12/2017   Diarrhea 05/20/2016   Controlled substance agreement signed 01/07/2016   Chronic back pain 08/25/2013   Gastroenteritis 07/31/2013   Gout 07/31/2013   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   Acute renal failure ([place]) 07/26/2013   Chronic kidney disease, stage III (moderate) ([place]) 07/26/2013   Vomiting 07/26/2013   Medication management 05/08/2013   Dysthymia 10/28/2012   Acute myocardial infarction, subsequent episode of care ([place]) 06/27/2012   Status post percutaneous transluminal coronary angioplasty 06/27/2012   Obesity, morbid, BMI 50 or higher ([place]) 09/12/2011   OSA (obstructive sleep apnea) 09/12/2011   CHEST PAIN 02/04/2010   Chronic ischemic heart disease 01/24/2010   Congestive heart failure ([place]) 01/24/2010   PALPITATIONS 07/22/2009   Hyperlipidemia associated with type 2 diabetes mellitus ([place]) 05/07/2009   Hypertension associated with diabetes ([place]) 05/07/2009   CAD (coronary artery disease) 05/07/2009   Chronic diastolic heart failure ([place]) 05/07/2009   Other postprocedural status(V45.89) 83/15/1761   Umbilical hernia with gangrene 05/16/2007    Palliative Care Assessment & Plan   Patient Profile: 57 y.o. female  with past medical history of DM2, HTN, HLD, Morbid Obesity, OSA, CKD 5, CAD/CABG, Chronic Diastolic CHF. Patient was brought to [facility] from [facility] where she has been alerted for the last three days. At baseline, she is A/Ox4 and can ambulate with assistance. She was recently diagnosed  with a UTI (11/28) and was given Cirpo as well as a gram of Rocephin. She was admitted on 03/09/2022  with AMS in the setting of septic shock.    PMT was consulted for [place] conversations given failure to improve.  Assessment: Principal Problem:   Septic shock (HCC) Active Problems:   CAD (coronary artery disease)   Chronic diastolic heart failure (HCC)   Obesity, morbid, BMI 50 or higher (HCC)   OSA (obstructive sleep apnea)   Insulin dependent type 2 diabetes mellitus (HCC)   Anemia of chronic disease   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   Acute metabolic encephalopathy   ESRD (end stage renal disease) on dialysis (HCC)   Chronic respiratory failure with hypoxia (HCC)   UTI (urinary tract infection)   Lactic acid acidosis   Malnutrition of moderate degree   Acute on chronic combined systolic and diastolic CHF (congestive heart failure) ([place])   Terminal care  Recommendations/Plan: Transition to full comfort measures tonight 12/8 at 6pm - daughter and son request to be present prior to discontinuation of vasopressors and extubation Now DNR, do not re-intubate Unrestricted visitation status prior to 6p for EOL visitation Recommend continuous dilaudid infusion initiation prior to extubation in addition to PRN bolus doses for breakthrough symptoms CCM to assist with comfort orders/transition at 6p PMT will continue to follow and support holistically; however, patient will likely pass overnight    Goals of Care and Additional Recommendations: Limitations on Scope of Treatment: Full Comfort Care  Code Status:    Code Status Orders  (From admission, onward)           Start     Ordered   03/10/2022 1513  Full code  Continuous        02/23/2022 1513           Code Status History     Date Active Date Inactive Code Status Order ID Comments User Context   01/16/2022 2227 02/04/2022 0721 Full Code [id number]  Etta Quill, DO Inpatient   12/17/2021 0359 01/06/2022 2143 Full Code [id number]  Vianne Bulls, MD Inpatient   10/25/2021  0101 11/05/2021 2202 Full Code [id number]  Lenore Cordia, MD Inpatient   10/01/2018 2219 10/17/2018 2118 Full Code [id number]  Nila Nephew, MD Inpatient       Prognosis:  Likely minutes-hours after discontinuation of life-prolonging measures  Discharge Planning: Anticipated Hospital Death  Care plan was discussed with primary RN, patient's family, [name]  Thank you for allowing the Palliative Medicine Team to assist in the care of this patient.   Total Time 70 minutes Prolonged Time Billed  yes       Greater than 50%  of this time was spent counseling and coordinating care related to the above assessment and plan.  Lin Landsman, NP  Please contact Palliative Medicine Team phone at [phone] for questions and concerns.   *Portions of this note are a verbal dictation therefore any spelling and/or grammatical errors are due to the "[employer]" system interpretation.

## 2022-03-23 NOTE — Progress Notes (Signed)
[facility] Progress Note  Subjective: pt seen in ICU. Net + 0.8L yesterday.  Clinically worsening, lactate rising.  CHF team has seen - dobutamine being added.  Abx broadened.  No family bedside.   Vitals:   2022-03-09 0600 03/09/2022 0700 09-Mar-2022 0721 03/09/2022 0852  BP:    (!) 127/47  Pulse:    (!) 109  Resp: (!) 29 18  (!) 32  Temp:   98.2 F (36.8 C)   TempSrc:   Axillary   SpO2:      Weight:        Exam: Gen intubated, obese No rash, cyanosis or gangrene No jvd or bruits Chest coarse ant on vent RRR no MRG Abd soft ntnd no mass or ascites +bs Ext diffuse 1+ bilat hip > pretib/ abd wall edema, LE bullous lesions noted on inner thighs - have dressings over today Neuro sedated    RIJ TDC in place          Home meds include - albuterol, aspirin, effient, uloric, fenofibrate, insulin lispro, midodrine 10 tid, sl ntg, ranexa, crestor, lexapro, lopressor 25 bid, oxy IR prn, prns/ vits/ supps     CXR - CM and vasc congestion   Creat 5.2  K 3.9  BUn 61  Na 130   WBC 26 K  Hb 9.1     LA 5.1 > 2.6 ,  PC 4.0    BCx's neg x 2 < 12 hrs    BP 96/47, HR 80, RR 10--20  afebrile    OP HD: MWF Sims 4h  400/ 600   137.5kg  2/2 bath  RIJ TDC   Hep 5000x 1 - venofer 100 tiw IV thru 12/11 - mircera 100 q2, last 11/20, due 12/04 - last HD 11/29 (Wed), post wt 137.5kg  - dry wt has been dropping     Assessment/ Plan: Sepsis/ shock - w/ lactic acidosis, UTI. On IV abx and levo + vaso gtt, outpt midodrine.  Broad spectrum coverage  - urine with enterobacter, blood cx NGTD, CSF no meningitis.  Dobutamine added as well.  AGMA:  In light of worsening AGMA changed post filter fluids to bicarb gtt.  Can give additional pushes of bicarb if needed.  AMS - secondary to sepsis, CT unrevealing MRI findings concerning for meningitis vs artifact; CSF no meningitis.   Chronic resp faliure - due to OHS/ OSA/ obesity, on vent Volume  - run even in light of profound shock. CAD h/o  CABG 2010 - though to have thrombosed on of her vein grafts this admission; no intervention possible ESRD - usual HD MWF, missed HD Friday. CRRT started 12/02 due to sepsis/ shock and vol overload. Cont CRRT.  Primary added back circuit heparin 12/6 after clotted. Running fine now.  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 Transfuse < 8 MBD ckd - CCa and phos in range. Not on any vdra.   Reach out if I can be of further assistance.  Prognosis looks poor at this point.     Jannifer Hick MD [facility] Pager [phone]    Recent Labs  Lab 02/26/22 1531 09-Mar-2022 0311 2022/03/09 0440  HGB  --  7.3* 8.8*  ALBUMIN 2.0* 2.8*  --   CALCIUM 8.7* 8.7*  --   PHOS 3.6 3.2  --   CREATININE 1.26* 1.13*  --   K 4.9 4.1 4.5  Recent Labs  Lab 02/25/22 1257  FERRITIN 780*    Inpatient medications:  artificial tears   Both Eyes Q8H   aspirin  81 mg Per Tube Daily   Chlorhexidine Gluconate Cloth  6 each Topical Daily   darbepoetin (ARANESP) injection - DIALYSIS  100 mcg Subcutaneous Q Mon-1800   docusate  200 mg Per Tube BID   droxidopa  100 mg Per Tube Q8H   famotidine  10 mg Per Tube Daily   feeding supplement (PROSource TF20)  60 mL Per Tube BID   insulin aspart  0-15 Units Subcutaneous Q4H   midodrine  15 mg Per Tube Q8H   multivitamin  1 tablet Per Tube QHS   mouth rinse  15 mL Mouth Rinse Q2H   polyethylene glycol  17 g Per Tube BID   sennosides  10 mL Per Tube BID   [START ON 03/06/2022] thiamine (VITAMIN B1) injection  100 mg Intravenous Daily    sodium chloride     sodium chloride Stopped (03/09/2022 0727)   dexmedetomidine (PRECEDEX) IV infusion     DOBUTamine 2.5 mcg/kg/min (09-Mar-2022 0929)   feeding supplement (VITAL 1.5 CAL) 50 mL/hr at 09-Mar-2022 0900   heparin 10,000 units/ 20 mL infusion syringe 750 Units/hr (March 09, 2022 0800)   linezolid (ZYVOX) IV Stopped (02/26/22 2308)   meropenem (MERREM)  IV Stopped (2022-03-09 0545)   norepinephrine (LEVOPHED) Adult infusion 18 mcg/min (03-09-22 0900)   prismasol BGK 4/2.5 1,500 mL/hr at 2022-03-09 0754   sodium bicarbonate 150 mEq in sterile water 1,150 mL infusion 400 mL/hr at 03-09-2022 0919   sodium bicarbonate 150 mEq in sterile water 1,150 mL infusion 400 mL/hr at 03/09/22 0643   thiamine (VITAMIN B1) injection Stopped (Mar 09, 2022 0640)   Followed by   Derrill Memo ON 02/28/2022] thiamine (VITAMIN B1) injection     vasopressin 0.04 Units/min (2022-03-09 0900)   sodium chloride, acetaminophen, fentaNYL (SUBLIMAZE) injection, fentaNYL (SUBLIMAZE) injection, heparin, lip balm

## 2022-03-23 NOTE — Progress Notes (Signed)
Pt extubated to RA  per comfort care orders. 

## 2022-03-23 NOTE — Progress Notes (Signed)
eLink Physician-Brief Progress Note Patient Name: [name] DOB: [birth year] MRN: [id number]   Date of Service  03/29/2022  HPI/Events of Note  ABG on 40%/PRVC 30/TV 190/P 5 = 7.175/29.4/198/10.8.  eICU Interventions  Plan: NaHCO3 100 meq IV now. Bedside nurse to reach out to nephrology about adjust the HCO3- in the CRRT fluid.  Repeat ABG at 12 noon.      Intervention Category Major Interventions: Respiratory failure - evaluation and management;Acid-Base disturbance - evaluation and management  Ruxin Ransome [place] 2022-03-29, 5:21 AM

## 2022-03-23 NOTE — Progress Notes (Signed)
Brief Palliative Medicine Progress Note:  Received page from Dr. Loanne Drilling - discussed case in detail. Patient continues to decline - now on three pressors and worsening lactic acidosis. CRRT discontinued due to malfunction.   12:21 PM Called and spoke with patient's daughter/[name] - she and her brother continued discussions last night and are ok with initiating DNR. Provided updates on worsening clinical status - discussed in detail - she wishes to hold CRRT at this time. She is going to call and discuss transition to full comfort measures with her brother/[name]. PMT number previously provided - she will call PMT with final decision.  Full PMT note to follow.  [name] [facility] Palliative Medicine Team Team Phone: [phone] NO CHARGE

## 2022-03-23 NOTE — Progress Notes (Signed)
NAME:  [name], MRN:  [id number], DOB:  1965-08-01, LOS: 7 ADMISSION DATE:  02/22/2022, CONSULTATION DATE:  03/01/2022 REFERRING MD:  Marda Stalker, CHIEF COMPLAINT:  AMS   History of Present Illness:  [name] is a 57y.o. female with a significant PMH for DM2, HTN, HLD, Morbid Obesity, OSA, ESRD, CAD/CABG, Chronic Diastolic CHF. Patient was brought to [facility] from [facility] where she had been admitted for three days. At baseline, she is A/Ox4 and can ambulate with assistance. She was recently diagnosed with a UTI (11/28) and was given Cipro as well as a gram of Rocephin. She is a on iHD MWF, and missed her last two sessions, except for (12/1).   Pertinent labs in the ER showed WBC 21.9, Cr 5.00, AST 141 ALT 51, LA 6.1. UA positive for leukocytes, proteins, and many bacteria. CT Head showed NAICA. Chest Xray showed CHF versus volume overload. Patient did receive Linezolid prior to Blood Cultures being collected.  BC pending.   PCCM consulted for AMS in the setting of septic shock.   Pertinent  Medical History  Unable to obtain d/t AMS.   Significant Hospital Events: Including procedures, antibiotic start and stop dates in addition to other pertinent events   12/1 Admitted  12/1 CVC and arterial catheter placed 12/2 Started Precedex Norepinephrine 11-15 12/4 Worsening Mental Status and pressor requirements  12/5 Intubated  Interim History / Subjective:  Malfunctioning CRRT reported this morning. Concerned for possible heparin bolus.  Unchanged mental status/encephalopathy Remains on high dose levophed and vasopressin. Cardiology added Dobutamine On minimal vent settings  Objective   Blood pressure (!) 87/24, pulse (!) 109, temperature 98.8 F (37.1 C), temperature source Axillary, resp. rate (!) 34, weight 128.6 kg, SpO2 97 %. CVP:  [3 mmHg-8 mmHg] 8 mmHg  Vent Mode: PRVC FiO2 (%):  [40 %-100 %] 40 % Set Rate:  [18 bmp-30 bmp] 30 bmp Vt Set:  [490 mL] 490  mL PEEP:  [5 cmH20] 5 cmH20 Plateau Pressure:  [15 cmH20-21 cmH20] 15 cmH20   Intake/Output Summary (Last 24 hours) at 03-13-22 1137 Last data filed at 03-13-2022 1100 Gross per 24 hour  Intake 4549.37 ml  Output 3431.5 ml  Net 1117.87 ml   Filed Weights   02/24/22 0219 02/25/22 0500 13-Mar-2022 0500  Weight: 127.7 kg 128.6 kg 128.6 kg   Physical Examination: Ill appearing woman on vent Triggers vent Opens eyes to voice Maybe follows commands?  Will dc sedation and recheck Some rhonci on lung exam Abd soft Stable LE wound on R Lichenification of skin throughout  Physical Exam: General: Critically and chronically ill-appearing, encephalopathic HENT: Rotan, AT, ETT in place Eyes: EOMI, no scleral icterus Respiratory: Scattered rhonchi bilaterally.  No crackles, wheezing or rales Cardiovascular: RRR, -M/R/G, no JVD GI: BS+, soft, nontender Extremities: Multiple wounds with dressings in place, LE lichenification Neuro: Roving eye movements, no tracking, does not follow commands, no withdrawal extremities to noxious stimuli  Patient Lines/Drains/Airways Status     Active Line/Drains/Airways     Name Placement date Placement time Site Days   Arterial Line 03/22/2022 Left Brachial 03/20/2022  1800  Brachial  6   Peripheral IV 03/08/2022 22 G 1.75" Anterior;Left Forearm 03/10/2022  1617  Forearm  6   CVC Triple Lumen 03/03/2022 Left Internal jugular 03/04/2022  1800  -- 6   Hemodialysis Catheter Right Internal jugular Double lumen Permanent (Tunneled) 01/17/22  1328  Internal jugular  40   Airway 7.5 mm 02/24/22  1100  --  2   Small Bore Feeding Tube Right nare Marking at nare/corner of mouth 70 cm 02/23/22  1300  Right nare  3   Wound / Incision (Open or Dehisced) 10/01/18 Leg Left open and bleeding 10/01/18  2234  Leg  1244   Wound / Incision (Open or Dehisced) 10/24/21 Non-pressure wound Heel Right blister/abcess 10/24/21  0035  Heel  125   Wound / Incision (Open or Dehisced) 10/29/21  Irritant Dermatitis (Moisture Associated Skin Damage) Labia Right;Left small pink area from moisture damage 10/29/21  1519  Labia  120   Wound / Incision (Open or Dehisced) 12/17/21 Other (Comment) Foot Right;Lower 12/17/21  --  Foot  71   Wound / Incision (Open or Dehisced) 01/20/22 Non-pressure wound Labia 01/20/22  0147  Labia  37   Wound / Incision (Open or Dehisced) 02/22/22 Non-pressure wound Foot Right bleeding 02/22/22  0500  Foot  4   Wound / Incision (Open or Dehisced) 02/25/22 Thigh Right;Medial;Mid POPPED BULLA 02/25/22  1000  Thigh  1   Wound / Incision (Open or Dehisced) 02/25/22 Thigh Left;Medial;Mid POPPED BULLA 6X6 CM 02/25/22  1000  Thigh  1   Wound / Incision (Open or Dehisced) 02/24/22 Other (Comment) Flank Lower;Right;Lateral POPPED BLLISTERS 02/24/22  0800  Flank  2   Wound / Incision (Open or Dehisced)  Flank Left;Lower;Lateral POPPED BLISTERS --  --  Flank  --           WBC up with steroids challenge LP neg for infection MRI unrevealing Coox not accurate  Resolved Hospital Problem list     Assessment & Plan:  Persistent shock- Possible mixed septic/cardiogenic shock. Extensive negative workup to date except some enterobacter in urine which has been treated, no change with abx, stress steroids. Worsening lactic acidosis NSTEMI, Hx CABG, cardiomyopathy- NOMI vs. OMI in question, not cath candidate at present; really would like accurate coox, to date they have been drawn off A line. Unexplained encephalopathy- again extensive workup and again no real answer ESRD with renal vasoplegia, metabolic acidosis- baseline low Bps Coagulopathy NOS- maybe DIC? TEG not helpful, h/h stable Severe PVD, nonhealing LE wounds- angio at some point with VVS if she recovers  - Full vent support for airway protection. Not a candidate for SBT or extubation due to critical illness - Restarted Linezolid and Meropenem 12/8 for empiric coverage. No clear source of infection. Cultures NGTD.  Trend LA - Trend ABG for metabolic acidosis - Remains off sedation without improvement - On dobutamine per Cardiology. Wean Levophed/vasopressin to MAP 60 - Continue midodrine and droxidopa titrate to effect to get off pressors - CRRT discontinued due to malfunction. Will attempt to replace however may not be needed pending GOC - Family meeting this afternoon this afternoon with PMT on what to do if we can't turn things around - Appreciate help of all consultants below to help figure this lady's physiology out  Consulting services: Neuro, Cardiology, Nephro, ID, VVS, PMT  Best Practice (right click and "Reselect all SmartList Selections" daily)   Diet/type: CorTrak TF DVT prophylaxis: holding d/t bleeding GI prophylaxis: PPI Lines: Tunneled Dialysis Catheter, Central Line, Aline Foley:  N/A Code Status:  full code Last date of multidisciplinary goals of care discussion: PMT consulted 12/5. Family meeting today   The patient is critically ill with multiple organ systems failure and requires high complexity decision making for assessment and support, frequent evaluation and titration of therapies, application of advanced monitoring technologies and extensive interpretation of multiple databases.  Independent Critical Care Time: 81 Minutes.   Rodman Pickle, M.D. [facility] Pulmonary/Critical Care Medicine March 15, 2022 12:03 PM   Please see Amion for pager number to reach on-call Pulmonary and Critical Care Team.

## 2022-03-23 NOTE — Progress Notes (Signed)
Inpatient Diabetes Program Recommendations  AACE/ADA: New Consensus Statement on Inpatient Glycemic Control (2015)  Target Ranges:  Prepandial:   less than 140 mg/dL      Peak postprandial:   less than 180 mg/dL (1-2 hours)      Critically ill patients:  140 - 180 mg/dL   Lab Results  Component Value Date   GLUCAP 168 (H) March 16, 2022   HGBA1C 6.9 (H) 10/25/2021    Review of Glycemic Control  Latest Reference Range & Units 02/26/22 11:33 02/26/22 15:38 02/26/22 20:19 02/26/22 23:13 03/16/22 03:14 Mar 16, 2022 07:19  Glucose-Capillary 70 - 99 mg/dL 201 (H) 192 (H) 131 (H) 216 (H) 213 (H) 168 (H)   Diabetes history: DM  Outpatient Diabetes medications:  Humalog 2-10 units tid with meals Current orders for Inpatient glycemic control:  Novolog 0-15 units q 4 hours Vital at 55 ml/hr  Inpatient Diabetes Program Recommendations:    Consider adding Novolog 3 units q 4 hours for tube feed coverage.   Thanks,  Adah Perl, RN, BC-ADM Inpatient Diabetes Coordinator Pager [phone]  (8a-5p)

## 2022-03-23 NOTE — Progress Notes (Signed)
Reported Bicarb of 10.8 to Nephrology Dr. Ree Kida that patient will be switched to both pre and post bicarb. Awaiting orders and bags from pharmacy.

## 2022-03-23 NOTE — Death Summary Note (Signed)
DEATH SUMMARY   Patient Details  Name: [name] MRN: [id number] DOB: Jul 24, 1965  Admission/Discharge Information   Admit Date:  03/15/2022  Date of Death: Date of Death: 22-Mar-2022  Time of Death: Time of Death: 06-26-98  Length of Stay: 7  Referring Physician: Jacklynn Ganong, MD   Reason(s) for Hospitalization  Altered mental status and weakness  Diagnoses  Preliminary cause of death: Undifferentiated shock Secondary Diagnoses (including complications and co-morbidities):  Principal Problem:   Septic shock ([place]) Active Problems:   CAD (coronary artery disease)   Chronic diastolic heart failure (HCC)   Obesity, morbid, BMI 50 or higher (HCC)   OSA (obstructive sleep apnea)   Insulin dependent type 2 diabetes mellitus (HCC)   Anemia of chronic disease   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   Acute metabolic encephalopathy   ESRD (end stage renal disease) on dialysis (HCC)   Chronic respiratory failure with hypoxia (HCC)   UTI (urinary tract infection)   Lactic acid acidosis   Malnutrition of moderate degree   Acute on chronic combined systolic and diastolic CHF (congestive heart failure) [facility])   Brief Hospital Course (including significant findings, care, treatment, and services provided and events leading to death)  [name] is a 57 year old obese woman with a history of diabetes, hypertension, hyperlipidemia, CAD/CABG, ESRD on HD (MWF). She has reportedly had encephalopathy and altered mental status for the last 3 days prior to admission, was diagnosed with a possible urinary tract infection and started on ciprofloxacin 11/28, also received ceftriaxone x1. She missed 2 hemodialysis sessions before being able to tolerate on 03-16-2023. She had progressive obtundation and confusion and was brought to the emergency department. PCCM admitted for septic shock secondary to suspected urinary source however despite aggressive medical management  including treatment of UTI, patient remained in persistent shock of unknown etiology. Extensive infectious, neuro and cardiac work-up with labs and imaging unrevealing. Palliative involved and after discussion with family regarding multiple system organ dysfunction, decision was made transition to comfort care. Patient expired on 2022/03/22 at 1900.  Pertinent Labs and Studies  Significant Diagnostic Studies EEG adult  Result Date: 02/26/2022 Landry Corporal, MD     02/26/2022  1:06 PM [facility] Routine EEG Report Demographics: Patient Name:   [name] Date of Birth:   [birth year] Identification Number:   MRN - [id number] Study Times:24 minutes Indication(s): Encephalopathy Technical Summary: This EEG was performed utilizing standard International 10-20 System of electrode placement. Data were obtained and interpreted utilizing referential montage recording, with reformatting to longitudinal, transverse bipolar, and referential montages as necessary for interpretation. State(s):      Coma (not medically induced)      Medically Induced Coma Activation Procedures: Hyperventilation: Not performed Photic Stimulation: Not performed EEG Description: No normal patterns of awake or sleep were present during this recording. Intermittent generalized theta-delta slowing was present. Excess continuous generalized theta-delta slowing was present. Triphasic waves are present. Occasional periods of brief generalized suppression are noted. Severe myogenic and electrode artifact are present throughout the recording and preclude a more thorough interpretation of the data. Impression: Within the limits of this study, Abnormal EEG due to:   1) No normal patterns of awake or sleep  2) Intermittent generalized theta-delta slowing  3) Continuous generalized theta-delta slowing  4) Triphasic waves  5) Occasional periods of brief generalized suppression Clinical Correlation:  Within the limits of this study: This EEG is suggestive of a moderate-severe encephalopathy,  but is nonspecific as to etiology. The absence of epileptiform abnormalities does not preclude a clinical diagnosis of seizures. If clinical concern for seizure persists, suggest repetition of the EEG with artifact reduction, and/or when the patient is in a more relaxed state. Nicolette Bang) [place] Board of Psychiatry and Neurology (ABPN); American Board of Clinical Neurophysiology  (ABCN) [facility] For Inpatient follow-up with [facility] physician please call [facility] [phone]. This is not an outpatient service. Post hospital discharge, please contact hospital directly.   VAS [place] LOWER EXTREMITY VENOUS (DVT)  Result Date: 02/25/2022  Lower Venous DVT Study Patient Name:  [name]  Date of Exam:   02/25/2022 Medical Rec #: [id number]       Accession #:    [id number] Date of Birth: Oct 18, 1965        Patient Gender: F Patient Age:   43 years Exam Location:  [facility] Procedure:      VAS [place] LOWER EXTREMITY VENOUS (DVT) Referring Phys: Darlina Sicilian --------------------------------------------------------------------------------  Indications: Edema.  Limitations: Body habitus, poor ultrasound/tissue interface and bandages. Comparison Study: 10/05/18 Performing Technologist: Archie Patten RVS  Examination Guidelines: A complete evaluation includes B-mode imaging, spectral Doppler, color Doppler, and power Doppler as needed of all accessible portions of each vessel. Bilateral testing is considered an integral part of a complete examination. Limited examinations for reoccurring indications may be performed as noted. The reflux portion of the exam is performed with the patient in reverse Trendelenburg.  +--------+---------------+---------+-----------+----------+--------------------+ RIGHT   CompressibilityPhasicitySpontaneityPropertiesThrombus Aging        +--------+---------------+---------+-----------+----------+--------------------+ CFV     Full           Yes      Yes                                       +--------+---------------+---------+-----------+----------+--------------------+ SFJ     Full                                                              +--------+---------------+---------+-----------+----------+--------------------+ FV Prox Full                                                              +--------+---------------+---------+-----------+----------+--------------------+ FV Mid                 Yes      Yes                  patent by color                                                           doppler              +--------+---------------+---------+-----------+----------+--------------------+ FV  Yes      Yes                  patent by color      Distal                                               doppler              +--------+---------------+---------+-----------+----------+--------------------+ PFV     Full                                                              +--------+---------------+---------+-----------+----------+--------------------+ POP     Full           Yes      Yes                                       +--------+---------------+---------+-----------+----------+--------------------+ PTV                    Yes      Yes                  patent by color                                                           doppler              +--------+---------------+---------+-----------+----------+--------------------+ PERO                                                 Not well visualized  +--------+---------------+---------+-----------+----------+--------------------+   +--------+---------------+---------+-----------+----------+--------------------+ LEFT    CompressibilityPhasicitySpontaneityPropertiesThrombus Aging        +--------+---------------+---------+-----------+----------+--------------------+ CFV     Full           Yes      Yes                                       +--------+---------------+---------+-----------+----------+--------------------+ SFJ     Full                                                              +--------+---------------+---------+-----------+----------+--------------------+ FV Prox Full                                                              +--------+---------------+---------+-----------+----------+--------------------+  FV Mid                 Yes      Yes                  patent by color                                                           doppler              +--------+---------------+---------+-----------+----------+--------------------+ FV                     Yes      Yes                  patent by color      Distal                                               doppler              +--------+---------------+---------+-----------+----------+--------------------+ PFV     Full                                                              +--------+---------------+---------+-----------+----------+--------------------+ POP     Full           Yes                                                +--------+---------------+---------+-----------+----------+--------------------+ PTV                                                  Not well visualized  +--------+---------------+---------+-----------+----------+--------------------+ PERO                                                 Not well visualized  +--------+---------------+---------+-----------+----------+--------------------+     Summary: RIGHT: - There is no evidence of deep vein thrombosis in the lower extremity. However, portions of this examination were limited- see technologist comments above.  - No cystic structure found in the popliteal fossa.  LEFT: - There is  no evidence of deep vein thrombosis in the lower extremity. However, portions of this examination were limited- see technologist comments above.  - No cystic structure found in the popliteal fossa.  *See table(s) above for measurements and observations. Electronically signed by Harold Barban MD on 02/25/2022 at 8:53:06 PM.    Final    MR BRAIN WO CONTRAST  Result Date: 02/25/2022 CLINICAL DATA:  Altered mental status EXAM: MRI HEAD WITHOUT CONTRAST TECHNIQUE: Multiplanar, multiecho  pulse sequences of the brain and surrounding structures were obtained without intravenous contrast. COMPARISON:  CT Head 02/24/2022 FINDINGS: Brain: No acute infarction, hemorrhage, hydrocephalus, extra-axial collection or mass lesion. There is sequela of very mild chronic microvascular ischemic change. There is incomplete suppression of sulcal fluid signal on the FLAIR sequences along the bilateral occipital and temporal convexities (series 4, image 14). Some of these regions may also demonstrate diffusion restriction (series 2, image 27). Vascular: Normal flow voids. Skull and upper cervical spine: Normal marrow signal. Sinuses/Orbits: Small bilateral mastoid effusions. Paranasal sinuses are otherwise clear. Orbits are normal in appearance. There is an enteric tube in place. Other: None IMPRESSION: 1. No acute intracranial process. 2. Incomplete suppression of sulcal fluid signal on the FLAIR sequences along the bilateral occipital and temporal convexities, which may be artifactual, but can be seen in the setting of meningitis. If there is clinical concern for meningitis, correlation with CSF sampling is recommended. Electronically Signed   By: Marin Roberts M.D.   On: 02/25/2022 12:49   US Abdomen Limited RUQ (LIVER/GB)  Result Date: 02/24/2022 CLINICAL DATA:  [address] [place] [address] EXAM: ULTRASOUND ABDOMEN LIMITED RIGHT UPPER QUADRANT COMPARISON:  CT abdomen pelvis 02/24/2022 FINDINGS: Gallbladder: Gallbladder sludge as well as  calcified gallstones within the gallbladder lumen. Positive gallbladder wall thickening measuring up to 3.5 mm. No pericholecystic fluid. No sonographic Murphy sign noted by sonographer. Common bile duct: Diameter: 5 mm Liver: No focal lesion identified. Possibly slightly increased parenchymal echogenicity. Portal vein is patent on color Doppler imaging with normal direction of blood flow towards the liver. Other: At least trace right pleural effusion. IMPRESSION: 1. Cholelithiasis with nonspecific gallbladder wall thickening. 2. Possible mild hepatic steatosis. 3. At least trace right pleural effusion. Electronically Signed   By: Iven Finn M.D.   On: 02/24/2022 23:20   CT CHEST ABDOMEN PELVIS W CONTRAST  Result Date: 02/24/2022 CLINICAL DATA:  Sepsis. EXAM: CT CHEST, ABDOMEN, AND PELVIS WITH CONTRAST TECHNIQUE: Multidetector CT imaging of the chest, abdomen and pelvis was performed following the standard protocol during bolus administration of intravenous contrast. RADIATION DOSE REDUCTION: This exam was performed according to the departmental dose-optimization program which includes automated exposure control, adjustment of the mA and/or kV according to patient size and/or use of iterative reconstruction technique. CONTRAST:  59m OMNIPAQUE IOHEXOL 350 MG/ML SOLN COMPARISON:  Chest radiograph 02/24/2022. Pelvic ultrasound 04/05/2018. Left hip CT 12/25/2021. FINDINGS: CT CHEST FINDINGS Cardiovascular: Thoracic aortic atherosclerosis without aneurysm. Status post CABG. Coronary atherosclerosis. Mild cardiomegaly. No pericardial effusion. Bilateral internal jugular central venous catheters terminating in the lower SVC. Mediastinum/Nodes: No enlarged axillary, mediastinal, or hilar lymph nodes. Unremarkable thyroid. Nondistended esophagus with enteric tube in place. Endotracheal tube terminating above the carina. Lungs/Pleura: Small bilateral pleural effusions. No pneumothorax. Respiratory motion artifact.  Dependent opacities in both lower lobes suggesting atelectasis. Musculoskeletal: No acute osseous abnormality or suspicious osseous lesion. Disc degeneration throughout the mid and upper thoracic spine. CT ABDOMEN PELVIS FINDINGS Hepatobiliary: No focal liver abnormality is identified within limitations of artifact from motion and the patient's arms. The gallbladder is moderately distended and contains high density fluid and suspected stones. No significant pericholecystic inflammation. Pancreas: Unremarkable. Spleen: Grossly unremarkable though with assessment limited by motion and streak artifact. Adrenals/Urinary Tract: Unremarkable adrenal glands. No hydronephrosis. Vascular calcifications in both renal hila. 9 mm hypodensity in the interpolar right kidney, too small to fully characterize though most likely representing a cyst and with no follow-up imaging recommended. Diffuse bladder wall thickening with  infiltration of the surrounding fat. Stomach/Bowel: An enteric tube terminates in the distal aspect of the stomach. There is no evidence of bowel obstruction. There is left-sided colonic diverticulosis with mild stranding adjacent to the proximal sigmoid colon but without substantial bowel wall thickening allowing for under distension. The appendix is possibly visualized posterior and medial to the cecum. There are no findings of acute appendicitis. Vascular/Lymphatic: Abdominal aortic atherosclerosis without aneurysm. No enlarged lymph nodes. Reproductive: Calcified uterine fibroids. No suspicious adnexal mass. Other: Small volume perihepatic free fluid. Diffuse body wall edema. Musculoskeletal: Severe L4-5 facet arthrosis. Moderate lower lumbar disc degeneration. IMPRESSION: 1. Small bilateral pleural effusions with bilateral lower lobe atelectasis. 2. Diffuse bladder wall thickening with surrounding inflammatory stranding, correlate for cystitis. 3. Colonic diverticulosis with mild stranding adjacent to the  proximal sigmoid colon, indeterminate for mild diverticulitis. 4. Anasarca with small volume ascites. 5. Distended gallbladder containing high density fluid and suspected stones. Consider right upper quadrant ultrasound if there is clinical concern for acute cholecystitis. 6.  Aortic Atherosclerosis (ICD10-I70.0). Electronically Signed   By: Logan Bores M.D.   On: 02/24/2022 16:17   CT HEAD WO CONTRAST (5MM)  Result Date: 02/24/2022 CLINICAL DATA:  Mental status change, unknown cause EXAM: CT HEAD WITHOUT CONTRAST TECHNIQUE: Contiguous axial images were obtained from the base of the skull through the vertex without intravenous contrast. RADIATION DOSE REDUCTION: This exam was performed according to the departmental dose-optimization program which includes automated exposure control, adjustment of the mA and/or kV according to patient size and/or use of iterative reconstruction technique. COMPARISON:  03/01/2022 FINDINGS: Brain: No evidence of acute infarction, hemorrhage, mass, mass effect, or midline shift. No hydrocephalus or extra-axial fluid collection. Vascular: No hyperdense vessel. Skull: Normal. Negative for fracture or focal lesion. Sinuses/Orbits: Small mucous retention cyst in the left maxillary sinus. Otherwise clear paranasal sinuses. No acute finding in the orbits. Other: The mastoid air cells are well aerated. Partially imaged nasogastric and endotracheal tubes. IMPRESSION: No acute intracranial process. Electronically Signed   By: Merilyn Baba M.D.   On: 02/24/2022 15:37   ECHOCARDIOGRAM LIMITED  Result Date: 02/24/2022    ECHOCARDIOGRAM LIMITED REPORT   Patient Name:   [name] Date of Exam: 02/24/2022 Medical Rec #:  [id number]      Height:       67.0 in Accession #:    [id number]     Weight:       281.5 lb Date of Birth:  Jan 24, 1966       BSA:          2.339 m Patient Age:    34 years       BP:           118/38 mmHg Patient Gender: F              HR:           95 bpm. Exam Location:   Inpatient Procedure: Limited Echo, Limited Color Doppler, Cardiac Doppler and Intracardiac            Opacification Agent Indications:    [facility]) [[id number]]  History:        Patient has prior history of Echocardiogram examinations, most                 recent 10/25/2021. CHF, CAD; Risk Factors:Sleep Apnea,                 Hypertension, Dyslipidemia and Diabetes.  Sonographer:    Bernadene Person  RDCS Referring Phys: [place]  1. Left ventricular ejection fraction, by estimation, is 25 to 30%. The left ventricle has severely decreased function. The left ventricle demonstrates regional wall motion abnormalities (see scoring diagram/findings for description). Indeterminate diastolic filling due to E-A fusion.  2. Right ventricular systolic function is moderately reduced. The right ventricular size is moderately enlarged.  3. The mitral valve is grossly normal. Trivial mitral valve regurgitation. No evidence of mitral stenosis.  4. The aortic valve is tricuspid. Aortic valve regurgitation is not visualized. No aortic stenosis is present. Comparison(s): Changes from prior study are noted. Distal septum and apex are akinetic. Inferior wall appears akinetic. All other segments are severely hypokinetic. EF is now reduced 25-30%. Conclusion(s)/Recommendation(s): Findings consistent with ischemic cardiomyopathy. FINDINGS  Left Ventricle: Left ventricular ejection fraction, by estimation, is 25 to 30%. The left ventricle has severely decreased function. The left ventricle demonstrates regional wall motion abnormalities. Definity contrast agent was given IV to delineate the left ventricular endocardial borders. The left ventricular internal cavity size was normal in size. There is no left ventricular hypertrophy. Indeterminate diastolic filling due to E-A fusion.  LV Wall Scoring: The entire inferior wall, apical septal segment, apical anterior segment, and apex are akinetic. Right Ventricle: The right  ventricular size is moderately enlarged. No increase in right ventricular wall thickness. Right ventricular systolic function is moderately reduced. Pericardium: Trivial pericardial effusion is present. Mitral Valve: The mitral valve is grossly normal. Trivial mitral valve regurgitation. No evidence of mitral valve stenosis. Tricuspid Valve: The tricuspid valve is grossly normal. Tricuspid valve regurgitation is trivial. No evidence of tricuspid stenosis. Aortic Valve: The aortic valve is tricuspid. Aortic valve regurgitation is not visualized. No aortic stenosis is present. Pulmonic Valve: The pulmonic valve was grossly normal. Pulmonic valve regurgitation is trivial. No evidence of pulmonic stenosis. Aorta: The aortic root is normal in size and structure. Additional Comments: Spectral Doppler performed. Color Doppler performed.  LEFT VENTRICLE PLAX 2D LVIDd:         5.80 cm      Diastology LVIDs:         3.80 cm      LV e' medial:    4.65 cm/s LV PW:         0.90 cm      LV E/e' medial:  20.0 LV IVS:        0.80 cm      LV e' lateral:   5.18 cm/s LVOT diam:     2.00 cm      LV E/e' lateral: 17.9 LV SV:         62 LV SV Index:   26 LVOT Area:     3.14 cm  LV Volumes (MOD) LV vol d, MOD A2C: 167.0 ml LV vol d, MOD A4C: 147.0 ml LV vol s, MOD A2C: 116.0 ml LV vol s, MOD A4C: 114.0 ml LV SV MOD A2C:     51.0 ml LV SV MOD A4C:     147.0 ml LV SV MOD BP:      42.7 ml RIGHT VENTRICLE RV S prime:     5.78 cm/s TAPSE (M-mode): 1.1 cm LEFT ATRIUM         Index LA diam:    4.00 cm 1.71 cm/m  AORTIC VALVE LVOT Vmax:   140.00 cm/s LVOT Vmean:  86.500 cm/s LVOT VTI:    0.196 m  AORTA Ao Root diam: 2.80 cm MITRAL VALVE MV Area (PHT): 5.38  cm    SHUNTS MV Decel Time: 141 msec    Systemic VTI:  0.20 m MV E velocity: 92.80 cm/s  Systemic Diam: 2.00 cm MV A velocity: 73.40 cm/s MV E/A ratio:  1.26 Eleonore Chiquito MD Electronically signed by Eleonore Chiquito MD Signature Date/Time: 02/24/2022/2:34:06 PM    Final    Portable Chest  x-ray  Result Date: 02/24/2022 CLINICAL DATA:  Endotracheal tube present EXAM: PORTABLE CHEST 1 VIEW COMPARISON:  Portable exam 1152 hours compared to 02/23/2022 FINDINGS: Tip of endotracheal tube 9 mm from carina, recommend withdrawal 1-2 cm. Feeding tube extends into stomach. RIGHT jugular line with tip projecting over SVC. LEFT jugular central venous catheter with tip projecting over superior RIGHT atrium. Enlargement of cardiac silhouette post CABG and coronary stenting. Mediastinal contours and pulmonary vascularity normal. Bibasilar atelectasis without pleural effusion or pneumothorax. IMPRESSION: Bibasilar atelectasis. Recommend withdrawal of endotracheal tube 1-2 cm. Findings called to [facility] in MICU on 02/24/2022 at 1210 hours. Electronically Signed   By: Lavonia Dana M.D.   On: 02/24/2022 12:10   VAS [place] LOWER EXTREMITY ARTERIAL DUPLEX  Result Date: 02/23/2022 LOWER EXTREMITY ARTERIAL DUPLEX STUDY Patient Name:  [name]  Date of Exam:   02/23/2022 Medical Rec #: [id number]       Accession #:    [id number] Date of Birth: 1966/01/30        Patient Gender: F Patient Age:   21 years Exam Location:  [facility] Procedure:      VAS [place] LOWER EXTREMITY ARTERIAL DUPLEX Referring Phys: Ina Homes --------------------------------------------------------------------------------  Indications: Unable to doppler pulses on clinical exam, history of PVD.  Current ABI: N/A- Previous ABI in August 2023 was non-compressible Limitations: Skin changes and body habitus Comparison Study: No prior studies. Performing Technologist: Darlin Coco RDMS, RVT  Examination Guidelines: A complete evaluation includes B-mode imaging, spectral Doppler, color Doppler, and power Doppler as needed of all accessible portions of each vessel. Bilateral testing is considered an integral part of a complete examination. Limited examinations for reoccurring indications may be performed as noted.   +-----------+--------+-----+--------+-------------------+--------+ RIGHT      PSV cm/sRatioStenosisWaveform           Comments +-----------+--------+-----+--------+-------------------+--------+ CFA Prox   104                  triphasic                   +-----------+--------+-----+--------+-------------------+--------+ CFA Mid    107                  triphasic                   +-----------+--------+-----+--------+-------------------+--------+ CFA Distal 100                  triphasic                   +-----------+--------+-----+--------+-------------------+--------+ DFA        72                   biphasic                    +-----------+--------+-----+--------+-------------------+--------+ SFA Prox   55                   biphasic                    +-----------+--------+-----+--------+-------------------+--------+ SFA Mid  94                   biphasic                    +-----------+--------+-----+--------+-------------------+--------+ SFA Distal 81                   biphasic                    +-----------+--------+-----+--------+-------------------+--------+ POP Prox   34                   biphasic                    +-----------+--------+-----+--------+-------------------+--------+ POP Mid    30                   biphasic                    +-----------+--------+-----+--------+-------------------+--------+ POP Distal 42                   biphasic                    +-----------+--------+-----+--------+-------------------+--------+ TP Trunk   101                  monophasic                  +-----------+--------+-----+--------+-------------------+--------+ ATA Prox   47                   monophasic                  +-----------+--------+-----+--------+-------------------+--------+ ATA Mid    13                   dampened monophasic         +-----------+--------+-----+--------+-------------------+--------+ ATA  Distal 13                   dampened monophasic         +-----------+--------+-----+--------+-------------------+--------+ PTA Prox   97                   monophasic                  +-----------+--------+-----+--------+-------------------+--------+ PTA Mid    55                   monophasic                  +-----------+--------+-----+--------+-------------------+--------+ PTA Distal 48                   monophasic                  +-----------+--------+-----+--------+-------------------+--------+ PERO Prox                       absent                      +-----------+--------+-----+--------+-------------------+--------+ PERO Mid                        absent                      +-----------+--------+-----+--------+-------------------+--------+ PERO Distal  absent                      +-----------+--------+-----+--------+-------------------+--------+ DP         8                    dampened monophasic         +-----------+--------+-----+--------+-------------------+--------+   Summary: Right: 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 Dampened monophasic signals in the anterior tibial artery. The posterior tibial artery is patent with monophasic waveforms. No evidence of significant focal stenosis.  See table(s) above for measurements and observations. Electronically signed by Servando Snare MD on 02/23/2022 at 4:39:02 PM.    Final    DG Abd Portable 1V  Result Date: 02/23/2022 CLINICAL DATA:  Encounter for feeding tube placement. EXAM: PORTABLE ABDOMEN - 1 VIEW COMPARISON:  Radiographs same date and 02/21/2022. FINDINGS: 1340 hours. A feeding tube has been placed, tip projecting over the right L1 transverse process, likely in the distal stomach or proximal duodenum. The visualized bowel gas pattern is nonobstructive. The heart appears mildly enlarged. IMPRESSION: Feeding tube tip likely in the distal stomach or proximal duodenum.  Electronically Signed   By: Richardean Sale M.D.   On: 02/23/2022 13:51   DG Chest Port 1 View  Result Date: 02/23/2022 CLINICAL DATA:  Sepsis. EXAM: PORTABLE CHEST 1 VIEW COMPARISON:  02/21/2022 FINDINGS: 0507 hours. Low volume film. The cardio pericardial silhouette is enlarged. Status post CABG. NG tube is been placed in the interval with tip overlying the body of the stomach. Proximal side port of the NG tube projects in the region of the esophagogastric junction. Right IJ central line tip overlies the mid SVC level. Left IJ central line tip is positioned over the upper right atrium. Vascular congestion noted with probable underlying interstitial edema. No substantial pleural effusion. IMPRESSION: Interval placement of NG tube with tip overlying the body of the stomach. Proximal side port of the NG tube projects in the region of the esophagogastric junction. Low volume film with cardiomegaly and vascular congestion. Component of associated interstitial edema suspected. Electronically Signed   By: Misty Stanley M.D.   On: 02/23/2022 06:57   DG Abd Portable 1V  Result Date: 02/21/2022 CLINICAL DATA:  NG tube placement EXAM: PORTABLE ABDOMEN - 1 VIEW COMPARISON:  None Available. FINDINGS: Tip and side port of the nasogastric tube project within the stomach. Examination otherwise limited by positioning. IMPRESSION: Tip and side port of the nasogastric tube project within the stomach. Electronically Signed   By: Ulyses Jarred M.D.   On: 02/21/2022 22:57   DG Chest Port 1 View  Result Date: 02/21/2022 CLINICAL DATA:  Sepsis and CHF EXAM: PORTABLE CHEST 1 VIEW COMPARISON:  Yesterday FINDINGS: Chronic cardiomegaly. CABG and coronary stenting. Dialysis catheter on the right and central line on the left with tips at the SVC the upper right atrium. Generalized interstitial prominence. No effusion or air leak. IMPRESSION: Cardiomegaly and vascular congestion. Electronically Signed   By: Jorje Guild M.D.    On: 02/21/2022 04:39   DG Chest Port 1 View  Result Date: 03/01/2022 CLINICAL DATA:  Central line placement EXAM: PORTABLE CHEST 1 VIEW COMPARISON:  Same day chest x-ray at 1110 hours FINDINGS: Interval placement of left internal jugular approach central venous catheter with distal tip terminating at the level of the proximal right atrium. Right IJ hemodialysis catheter remains in place. Stable cardiomegaly status post sternotomy and CABG. Mild pulmonary vascular congestion with  slightly improving lung aeration. No pneumothorax. IMPRESSION: Interval placement of left internal jugular approach central venous catheter with distal tip terminating at the level of the proximal right atrium. No pneumothorax. Electronically Signed   By: Davina Poke D.O.   On: 03/09/2022 19:13   CT Head Wo Contrast  Result Date: 03/11/2022 CLINICAL DATA:  Altered mental status. EXAM: CT HEAD WITHOUT CONTRAST TECHNIQUE: Contiguous axial images were obtained from the base of the skull through the vertex without intravenous contrast. RADIATION DOSE REDUCTION: This exam was performed according to the departmental dose-optimization program which includes automated exposure control, adjustment of the mA and/or kV according to patient size and/or use of iterative reconstruction technique. COMPARISON:  None Available. FINDINGS: Brain: There is mild cerebral atrophy with widening of the extra-axial spaces and ventricular dilatation. There are areas of decreased attenuation within the white matter tracts of the supratentorial brain, consistent with microvascular disease changes. Vascular: No hyperdense vessel or unexpected calcification. Skull: Normal. Negative for fracture or focal lesion. Sinuses/Orbits: No acute finding. Other: None. IMPRESSION: 1. No acute intracranial abnormality. 2. 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 Electronically Signed   By: Virgina Norfolk M.D.   On: 02/26/2022  12:49   DG Chest Portable 1 View  Result Date: 03/21/2022 CLINICAL DATA:  Altered mental status EXAM: PORTABLE CHEST 1 VIEW COMPARISON:  Prior chest x-ray 02/02/2022 FINDINGS: Right IJ tunneled hemodialysis catheter with the tip overlying the upper SVC. Stable cardiomegaly. Patient is status post median sternotomy with evidence of prior multivessel CABG. Diffuse bilateral interstitial airspace opacities consistent with pulmonary edema. No pneumothorax. IMPRESSION: 1. CHF versus volume overload. 2. Stable position of right IJ tunneled hemodialysis catheter. Electronically Signed   By: Jacqulynn Cadet M.D.   On: 03/22/2022 11:30   DG Chest Port 1 View  Result Date: 02/02/2022 CLINICAL DATA:  Shortness of breath. EXAM: PORTABLE CHEST 1 VIEW COMPARISON:  January 24, 2022. FINDINGS: Stable cardiomegaly. Status post coronary bypass graft. Mild central pulmonary vascular congestion is noted. No consolidative process is noted. Right internal jugular CT dialysis catheter is again noted. Bony thorax is unremarkable. IMPRESSION: Stable cardiomegaly with mild central pulmonary vascular congestion. Electronically Signed   By: Marijo Conception M.D.   On: 02/02/2022 16:30    Microbiology Recent Results (from the past 240 hour(s))  Urine Culture     Status: Abnormal   Collection Time: 03/19/2022 11:10 AM   Specimen: In/Out Cath Urine  Result Value Ref Range Status   Specimen Description IN/OUT CATH URINE  Final   Special Requests   Final    NONE Performed at [name], [address], [place], [place] [address]    Culture (A)  Final    20,000 COLONIES/mL ENTEROBACTER AEROGENES 50,000 COLONIES/mL YEAST    Report Status 02/23/2022 FINAL  Final   Organism ID, Bacteria ENTEROBACTER AEROGENES (A)  Final      Susceptibility   Enterobacter aerogenes - MIC*    CEFAZOLIN >=64 RESISTANT Resistant     CEFEPIME 0.25 SENSITIVE Sensitive     CEFTRIAXONE 8 RESISTANT Resistant     CIPROFLOXACIN <=0.25  SENSITIVE Sensitive     GENTAMICIN <=1 SENSITIVE Sensitive     IMIPENEM 0.5 SENSITIVE Sensitive     NITROFURANTOIN 64 INTERMEDIATE Intermediate     TRIMETH/SULFA <=20 SENSITIVE Sensitive     PIP/TAZO >=128 RESISTANT Resistant     * 20,000 COLONIES/mL ENTEROBACTER AEROGENES  Blood culture (routine x 2)  Status: None   Collection Time: 03/06/2022  4:09 PM   Specimen: BLOOD LEFT ARM  Result Value Ref Range Status   Specimen Description BLOOD LEFT ARM  Final   Special Requests   Final    BOTTLES DRAWN AEROBIC AND ANAEROBIC Blood Culture results may not be optimal due to an inadequate volume of blood received in culture bottles   Culture   Final    NO GROWTH 5 DAYS Performed at [facility], [place] [address], [place], [place] [address]    Report Status 02/25/2022 FINAL  Final  MRSA Next Gen by PCR, Nasal     Status: None   Collection Time: 03/08/2022  5:27 PM   Specimen: Nasal Mucosa; Nasal Swab  Result Value Ref Range Status   MRSA by PCR Next Gen NOT DETECTED NOT DETECTED Final    Comment: (NOTE) The GeneXpert MRSA Assay (FDA approved for NASAL specimens only), is one component of a comprehensive MRSA colonization surveillance program. It is not intended to diagnose MRSA infection nor to guide or monitor treatment for MRSA infections. Test performance is not FDA approved in patients less than 24 years old. Performed at [facility], [place] [address], [place], [place] [address]   Blood culture (routine x 2)     Status: None   Collection Time: 03/19/2022  8:23 PM   Specimen: BLOOD LEFT HAND  Result Value Ref Range Status   Specimen Description BLOOD LEFT HAND  Final   Special Requests   Final    BOTTLES DRAWN AEROBIC AND ANAEROBIC Blood Culture results may not be optimal due to an inadequate volume of blood received in culture bottles   Culture   Final    NO GROWTH 5 DAYS Performed at [facility], [place] [address] [place], [place] [address]    Report Status  02/25/2022 FINAL  Final  CSF culture w Gram Stain     Status: None   Collection Time: 02/25/22  3:14 PM   Specimen: CSF; Cerebrospinal Fluid  Result Value Ref Range Status   Specimen Description CSF  Final   Special Requests NONE  Final   Gram Stain   Final    WBC PRESENT, PREDOMINANTLY MONONUCLEAR NO ORGANISMS SEEN CYTOSPIN SMEAR    Culture   Final    NO GROWTH 3 DAYS Performed at [facility], [place] [address], [place], [place] [address]    Report Status 02/28/2022 FINAL  Final  Anaerobic culture w Gram Stain     Status: None (Preliminary result)   Collection Time: 02/25/22  3:14 PM   Specimen: CSF; Cerebrospinal Fluid  Result Value Ref Range Status   Specimen Description CSF  Final   Special Requests   Final    NONE Performed at [facility], [place] [address] [place], [place] [address]    Gram Stain PENDING  Incomplete   Culture   Final    NO ANAEROBES ISOLATED; CULTURE IN PROGRESS FOR 5 DAYS   Report Status PENDING  Incomplete  Culture, fungus without smear     Status: None (Preliminary result)   Collection Time: 02/25/22  3:14 PM   Specimen: CSF; Cerebrospinal Fluid  Result Value Ref Range Status   Specimen Description CSF  Final   Special Requests NONE  Final   Culture   Final    NO FUNGUS ISOLATED AFTER 2 DAYS Performed at [facility], [place] [address], [place],  [address]  Report Status PENDING  Incomplete    Lab Basic Metabolic Panel: Recent Labs  Lab 02/23/22 0330 02/23/22 0931 02/24/22 0136 02/24/22 6387 02/25/22 0335 02/25/22 1257 02/26/22 0322 02/26/22 1458 02/26/22 1531 02-28-2022 0311 Feb 28, 2022 0440 February 28, 2022 1505  NA 133*   < > 135   < > 133* 133* 134* 135 134* 133* 137 136  K 4.1   < > 4.8   < > 4.3 4.6 4.8 4.8 4.9 4.1 4.5 3.9  CL 101   < > 101   < > 102 103 104  --  106 102  --  101  CO2 20*   < > 17*   < > 19* 15* 14*  --  15* 12*  --  16*  GLUCOSE 122*   < > 122*   < > 225* 235* 234*  --  211* 202*  --  291*  BUN  27*   < > 22*   < > 21* 30* 25*  --  21* 20  --  27*  CREATININE 2.27*   < > 1.76*   < > 1.42* 1.74* 1.47*  --  1.26* 1.13*  --  1.39*  CALCIUM 8.2*   < > 8.8*   < > 8.1* 8.4* 8.3*  --  8.7* 8.7*  --  8.7*  MG 2.3  --  2.5*  --  2.4  --  2.4  --   --  2.4  --   --   PHOS 3.5   < > 3.2   < > 2.7 3.7 3.4  --  3.6 3.2  --  3.5   < > = values in this interval not displayed.   Liver Function Tests: Recent Labs  Lab 02/25/22 0926 02/25/22 1257 02/26/22 0322 02/26/22 1531 February 28, 2022 0311 02-28-22 1505  AST 111*  --   --   --   --   --   ALT 51*  --   --   --   --   --   ALKPHOS 111  --   --   --   --   --   BILITOT 6.2*  --   --   --   --   --   PROT 5.5*  --   --   --   --   --   ALBUMIN <1.5* <1.5* <1.5* 2.0* 2.8* 2.2*   No results for input(s): "LIPASE", "AMYLASE" in the last 168 hours. No results for input(s): "AMMONIA" in the last 168 hours. CBC: Recent Labs  Lab 02/23/22 0330 02/23/22 0931 02/24/22 0136 02/24/22 0832 02/25/22 0335 02/26/22 0322 02/26/22 1458 Feb 28, 2022 0311 02-28-2022 0440  WBC 24.7*  --  26.0*  --  23.1* 34.1*  --  32.1*  --   HGB 9.2*   < > 9.4*   < > 8.5* 8.9* 9.5* 7.3* 8.8*  HCT 28.2*   < > 29.1*   < > 24.9* 27.8* 28.0* 23.4* 26.0*  MCV 80.6  --  81.3  --  79.0* 81.8  --  86.3  --   PLT 154  --  166  169  --  114* 101*  --  66*  --    < > = values in this interval not displayed.   Cardiac Enzymes: Recent Labs  Lab 02/24/22 0136  CKTOTAL 430*   Sepsis Labs: Recent Labs  Lab 02/22/22 0345 02/23/22 0330 02/24/22 0136 02/24/22 5643 02/25/22 0335 02/25/22 3295 02/25/22 1809 02/26/22 0322 02/26/22 1624 February 28, 2022 1884 February 28, 2022 1660  Mar 26, 2022 1317  PROCALCITON 2.97  --  4.07  --   --   --   --   --   --   --   --   --   WBC 26.3*   < > 26.0*  --  23.1*  --   --  34.1*  --  32.1*  --   --   LATICACIDVEN 3.2*   < >  --    < >  --    < > 6.3*  --  8.6*  --  >9.0* >9.0*   < > = values in this interval not displayed.    Tempie Gibeault Rodman Pickle  MD 02/28/2022, 4:29 PM

## 2022-03-23 NOTE — Plan of Care (Signed)
Family has decided to transition to comfort care. Neurology will sign off, but please re-engage if any additional neurologic concerns arise.  Su Monks, MD [facility] [phone]  If 7pm- 7am, please page neurology on call as listed in [place].

## 2022-03-23 NOTE — Progress Notes (Signed)
Patient time of death is [place] RN Nettie Elm RN listen for over 2 minutes and no lung or heart sounds auscultated.

## 2022-03-23 NOTE — IPAL (Signed)
  Interdisciplinary Goals of Care Family Meeting   Date carried out: 12-Mar-2022  Location of the meeting: Bedside  Member's involved: Physician, Bedside Registered Nurse, and Family Member or next of kin  Durable Power of Attorney or acting medical decision maker: Daughter and brother of patient    Discussion: We discussed goals of care for [name] .  Family ready to transition care to comfort care  Code status: Full DNR  Disposition: In-patient comfort care  Time spent for the meeting: 15 min    Banessa Mao Rodman Pickle, MD  2022-03-12, 6:09 PM

## 2022-03-23 DEATH — deceased
